# Patient Record
Sex: Male | Born: 1970 | Race: White | Hispanic: No | Smoking: Never smoker
Health system: Southern US, Community
[De-identification: ages and names within clinical notes are randomized; demographics above are authoritative.]

## PROBLEM LIST (undated history)

## (undated) DIAGNOSIS — I4891 Unspecified atrial fibrillation: Secondary | ICD-10-CM

## (undated) DIAGNOSIS — Q249 Congenital malformation of heart, unspecified: Secondary | ICD-10-CM

## (undated) DIAGNOSIS — I2510 Atherosclerotic heart disease of native coronary artery without angina pectoris: Secondary | ICD-10-CM

## (undated) DIAGNOSIS — R112 Nausea with vomiting, unspecified: Secondary | ICD-10-CM

## (undated) DIAGNOSIS — R0600 Dyspnea, unspecified: Secondary | ICD-10-CM

## (undated) DIAGNOSIS — G4733 Obstructive sleep apnea (adult) (pediatric): Secondary | ICD-10-CM

## (undated) DIAGNOSIS — N182 Chronic kidney disease, stage 2 (mild): Secondary | ICD-10-CM

## (undated) DIAGNOSIS — E7800 Pure hypercholesterolemia, unspecified: Secondary | ICD-10-CM

## (undated) DIAGNOSIS — R748 Abnormal levels of other serum enzymes: Secondary | ICD-10-CM

## (undated) DIAGNOSIS — D696 Thrombocytopenia, unspecified: Secondary | ICD-10-CM

## (undated) DIAGNOSIS — Z951 Presence of aortocoronary bypass graft: Secondary | ICD-10-CM

## (undated) DIAGNOSIS — Z9889 Other specified postprocedural states: Secondary | ICD-10-CM

## (undated) DIAGNOSIS — R001 Bradycardia, unspecified: Secondary | ICD-10-CM

## (undated) DIAGNOSIS — R319 Hematuria, unspecified: Secondary | ICD-10-CM

## (undated) DIAGNOSIS — K761 Chronic passive congestion of liver: Secondary | ICD-10-CM

## (undated) DIAGNOSIS — I4892 Unspecified atrial flutter: Secondary | ICD-10-CM

## (undated) DIAGNOSIS — I10 Essential (primary) hypertension: Secondary | ICD-10-CM

## (undated) DIAGNOSIS — I5042 Chronic combined systolic (congestive) and diastolic (congestive) heart failure: Secondary | ICD-10-CM

## (undated) HISTORY — DX: Chronic passive congestion of liver: K76.1

## (undated) HISTORY — DX: Unspecified atrial flutter: I48.92

## (undated) HISTORY — DX: Chronic combined systolic (congestive) and diastolic (congestive) heart failure: I50.42

## (undated) HISTORY — DX: Essential (primary) hypertension: I10

## (undated) HISTORY — DX: Chronic kidney disease, stage 2 (mild): N18.2

## (undated) HISTORY — DX: Congenital malformation of heart, unspecified: Q24.9

## (undated) HISTORY — PX: APPENDECTOMY: SHX54

## (undated) HISTORY — PX: GALLBLADDER SURGERY: SHX652

## (undated) HISTORY — PX: HERNIA REPAIR: SHX51

## (undated) HISTORY — DX: Pure hypercholesterolemia, unspecified: E78.00

---

## 2002-07-21 ENCOUNTER — Encounter: Admission: RE | Admit: 2002-07-21 | Discharge: 2002-07-21 | Payer: Self-pay

## 2002-08-16 ENCOUNTER — Encounter: Payer: Self-pay | Admitting: Urology

## 2002-08-16 ENCOUNTER — Encounter: Admission: RE | Admit: 2002-08-16 | Discharge: 2002-08-16 | Payer: Self-pay | Admitting: Urology

## 2010-09-30 ENCOUNTER — Inpatient Hospital Stay (HOSPITAL_COMMUNITY): Admission: EM | Admit: 2010-09-30 | Discharge: 2010-10-01 | Payer: Self-pay | Admitting: Emergency Medicine

## 2011-02-12 LAB — MRSA PCR SCREENING: MRSA by PCR: NEGATIVE

## 2011-04-23 ENCOUNTER — Other Ambulatory Visit (INDEPENDENT_AMBULATORY_CARE_PROVIDER_SITE_OTHER): Payer: Self-pay | Admitting: Surgery

## 2011-04-23 ENCOUNTER — Ambulatory Visit (HOSPITAL_COMMUNITY)
Admission: RE | Admit: 2011-04-23 | Discharge: 2011-04-23 | Disposition: A | Discharge status: Home / Self Care | Payer: 59 | Source: Ambulatory Visit | Attending: Surgery | Admitting: Surgery

## 2011-04-23 ENCOUNTER — Encounter (HOSPITAL_COMMUNITY): Payer: 59

## 2011-04-23 DIAGNOSIS — K432 Incisional hernia without obstruction or gangrene: Secondary | ICD-10-CM | POA: Insufficient documentation

## 2011-04-23 DIAGNOSIS — Z01812 Encounter for preprocedural laboratory examination: Secondary | ICD-10-CM | POA: Insufficient documentation

## 2011-04-23 DIAGNOSIS — I10 Essential (primary) hypertension: Secondary | ICD-10-CM | POA: Insufficient documentation

## 2011-04-23 DIAGNOSIS — Z01810 Encounter for preprocedural cardiovascular examination: Secondary | ICD-10-CM | POA: Insufficient documentation

## 2011-04-23 DIAGNOSIS — Z01818 Encounter for other preprocedural examination: Secondary | ICD-10-CM | POA: Insufficient documentation

## 2011-04-23 LAB — CBC
HCT: 46.5 % (ref 39.0–52.0)
Hemoglobin: 15.6 g/dL (ref 13.0–17.0)
MCH: 31.7 pg (ref 26.0–34.0)
MCHC: 33.5 g/dL (ref 30.0–36.0)
RBC: 4.92 MIL/uL (ref 4.22–5.81)

## 2011-04-23 LAB — DIFFERENTIAL
Basophils Relative: 1 % (ref 0–1)
Eosinophils Relative: 8 % — ABNORMAL HIGH (ref 0–5)
Monocytes Absolute: 1.1 10*3/uL — ABNORMAL HIGH (ref 0.1–1.0)
Neutro Abs: 4 10*3/uL (ref 1.7–7.7)
Neutrophils Relative %: 55 % (ref 43–77)

## 2011-04-23 LAB — COMPREHENSIVE METABOLIC PANEL
ALT: 78 U/L — ABNORMAL HIGH (ref 0–53)
AST: 24 U/L (ref 0–37)
CO2: 27 mEq/L (ref 19–32)
Chloride: 105 mEq/L (ref 96–112)
Creatinine, Ser: 1.07 mg/dL (ref 0.4–1.5)
GFR calc Af Amer: >60 mL/min (ref >60)
GFR calc non Af Amer: >60 mL/min (ref >60)
Glucose, Bld: 90 mg/dL (ref 70–99)
Sodium: 141 mEq/L (ref 135–145)
Total Bilirubin: 0.6 mg/dL (ref 0.3–1.2)

## 2011-04-23 LAB — SURGICAL PCR SCREEN: Staphylococcus aureus: POSITIVE — AB

## 2011-04-29 ENCOUNTER — Inpatient Hospital Stay (HOSPITAL_COMMUNITY): Admit: 2011-04-29 | Payer: Self-pay | Admitting: Surgery

## 2011-04-29 ENCOUNTER — Inpatient Hospital Stay (HOSPITAL_COMMUNITY): Admission: RE | Admit: 2011-04-29 | Payer: 59 | Source: Ambulatory Visit | Admitting: Surgery

## 2011-06-06 ENCOUNTER — Other Ambulatory Visit (HOSPITAL_COMMUNITY): Payer: 59

## 2011-06-10 ENCOUNTER — Ambulatory Visit (HOSPITAL_COMMUNITY)
Admission: RE | Admit: 2011-06-10 | Discharge: 2011-06-11 | DRG: 160 | Disposition: A | Discharge status: Home / Self Care | Payer: BC Managed Care – PPO | Source: Ambulatory Visit | Attending: Surgery | Admitting: Surgery

## 2011-06-10 DIAGNOSIS — G4730 Sleep apnea, unspecified: Secondary | ICD-10-CM | POA: Insufficient documentation

## 2011-06-10 DIAGNOSIS — I10 Essential (primary) hypertension: Secondary | ICD-10-CM | POA: Insufficient documentation

## 2011-06-10 DIAGNOSIS — E669 Obesity, unspecified: Secondary | ICD-10-CM | POA: Insufficient documentation

## 2011-06-10 DIAGNOSIS — K432 Incisional hernia without obstruction or gangrene: Secondary | ICD-10-CM | POA: Insufficient documentation

## 2011-06-10 LAB — BASIC METABOLIC PANEL
Calcium: 8.8 mg/dL (ref 8.4–10.5)
Creatinine, Ser: 1.24 mg/dL (ref 0.50–1.35)
GFR calc Af Amer: >60 mL/min (ref >60)
GFR calc non Af Amer: >60 mL/min (ref >60)
Sodium: 140 mEq/L (ref 135–145)

## 2011-06-10 LAB — SURGICAL PCR SCREEN: MRSA, PCR: NEGATIVE

## 2011-06-10 LAB — CBC
MCH: 32.8 pg (ref 26.0–34.0)
MCV: 95.7 fL (ref 78.0–100.0)
Platelets: 97 10*3/uL — ABNORMAL LOW (ref 150–400)
RDW: 13.3 % (ref 11.5–15.5)

## 2011-06-13 NOTE — Op Note (Signed)
NAMEJHON, JUDE NO.:  [id number]  MEDICAL RECORD NO.:  [id number]  LOCATION:  5149                         FACILITY:  [facility]  PHYSICIAN:  Marcello Moores A. Bayani Renteria, M.D.DATE OF BIRTH:  [birth year]  DATE OF PROCEDURE:  06/10/2011 DATE OF DISCHARGE:                              OPERATIVE REPORT   PREOPERATIVE DIAGNOSIS:  Recurrent incisional hernia.  POSTOPERATIVE DIAGNOSIS:  Recurrent incisional hernia.  PROCEDURE:  Laparoscopic repair of recurrent incisional hernia using a 20 x 30 cm piece of Parietex composite mesh.  SURGEON:  Marcello Moores A. Ronika Kelson, MD  ASSISTANT:  Odis Hollingshead, MD  ESTIMATED BLOOD LOSS:  50 mL.  SPECIMENS:  None.  INDICATIONS FOR PROCEDURE:  The patient is a 40 year old male who has recurrent periumbilical incisional hernia.  He was seen by Dr. Blinda Leatherwood in the office, but he wished to have it done repaired laparoscopically.  I met the patient in the office.  Reviewed Dr. Dickie La note.  Discussed the procedure with the patient in the office. I discussed the pros and cons of laparoscopic repairs and complications of laparoscopic repair compared to an open repair versus observational treatment.  He was having pain and discomfort and did not want to observe this.  Risk of surgery including but not exclusive of bleeding, infection, injury to the small bowel, colon, stomach, liver, pelvic structures of the bladder, kidney, ureters, spermatic cords, chronic pain from suture and mesh use and staple use, recurrence of hernia, small bowel obstruction requiring further surgery, and the need for revision of surgery which would include an open repair if his laparoscopic repair fails.  Other potential risks of cardiovascular risk, death, pulmonary complications, DVT, and a persistent seroma at the operative site.  After discussion of all the above, he wished to proceed.  DESCRIPTION OF PROCEDURE:  The patient was brought to the  operating room after we met in the holding area.  He was placed supine on the operating room table.  After induction of general anesthesia, Foley catheter was placed under sterile conditions.  The abdomen was prepped and draped in sterile fashion.  Time-out was done.  He received preoperative antibiotics in timing fashion.  A left upper quadrant 5-mm incision was made.  A 5-mm port Optiview port was used to gain access into the abdominal cavity under direct vision.  All layers of the abdominal wall visualized and entrance into the abdominal cavity was not difficult.  We then insufflated the abdominal cavity to 15 mmHg of CO2 and laparoscope was placed.  He had roughly a 15-cm defect in the periumbilical region. There were no significant intra-abdominal adhesions though.  He had a previous open cholecystectomy and there were adhesions in right upper quadrant minimal.  I then placed a left lower quadrant 5-mm port and a right upper quadrant 5-mm port.  After doing this, I mobilized the inferior rim of the hernia using a harmonic scalpel to take the preperitoneal fat pad down for about 5 cm so the mesh would lie flat below it.  Once we did this, I used a 20 x 30 cm composite Parietex mesh.  It was soaked.  Then, four stay sutures were placed in the  mesh and was oriented with a marking pen.  We then exchanged the right upper quadrant port for a 12-mm port.  I placed a mesh to that and later rolled out into the abdominal cavity without difficulty.  We then pulled all 4 stay sutures up initially.  These were tied down.  I then used a secure strap tacker and tacked the mesh circumferentially.  I then put 4 additional stay sutures in between the previous sutures under direct vision.  No #1 Novafil sutures were used for all the above.  I then put a second row of tacks circumferentially around the mesh using secure strap.  The barrier side was down toward the bowel.  There was no significant  bleeding.  I inspected the abdominal cavity and saw no evidence of injury to any of the intra-abdominal organs in all 4 quadrants.  At this point in time, I closed the 12-mm port site with a 0 Vicryl using a suture passer under direct vision.  Expro was injected under laparoscopic guidance along both sides of the rectus sheath for regional block using a total of 50 mL.  This was 20 mL diluted by 30 mL of saline.  We then evacuated the carbon dioxide.  We removed our ports without difficulty.  We closed the larger skin incisions with 4-0 Monocryl.  Dermabond was used to close the smaller skin incisions.  All final counts of sponge, needle, and instruments were found to be correct for this portion of the case.  Foley catheter was removed at the end of the case.  The patient did awaken from anesthesia coughing significantly.  I kept my hand over the wound as this occurred and felt no tearing or shearing of the mesh until Anesthesia could get the patient to stop coughing.  Binder was then applied.  He was then after extubation taken to recovery in satisfactory condition.  All final counts were found to be correct x2.     Casmira Cramer A. Jadore Veals, M.D.     TAC/MEDQ  D:  06/10/2011  T:  06/11/2011  Job:  [id number]  Electronically Signed by Erroll Luna M.D. on 06/13/2011 02:56:25 PM

## 2011-06-13 NOTE — Discharge Summary (Signed)
  Sean [name], OKIN NO.:  [id number]  MEDICAL RECORD NO.:  [id number]  LOCATION:  5149                         FACILITY:  [facility]  PHYSICIAN:  Marcello Moores A. Rhythm Wigfall, M.D.DATE OF BIRTH:  [birth year]  DATE OF ADMISSION:  06/10/2011 DATE OF DISCHARGE:  06/11/2011                              DISCHARGE SUMMARY   ADMITTING DIAGNOSIS:  Recurrent incisional hernia.  DISCHARGE DIAGNOSIS:  Recurrent incisional hernia.  PROCEDURE:  Laparoscopic repair of incisional hernia with mesh.  SURGEON:  Marcello Moores A. Graham Hyun, MD  BRIEF HISTORY:  The patient is a 40 year old male with recurrent periumbilical incisional hernia.  This was repaired once in the past, but is recurred.  He was admitting for laparoscopic repair.  Please see operative note for details.  HOSPITAL COURSE:  Unremarkable.  He is tolerating on postop day #1.  His signs were stable.  His wound are clean, dry, and intact.  He is wearing abdominal binder and had nonacute abdomen.  He was discharged home.  DISCHARGE INSTRUCTIONS:  He wears binder.  He may shower today.  Return to see me in 2-3 weeks.  He will refrain from heavy lifting until I see him back in the office.  He will refrain from driving until that is well.  He will resume regular diet.  He will go home on Percocet 1-2 tablets q.4 p.r.n. pain and resume all of his home medications as outlined in his medical reconciliation list contained in the chart and I have signed off from this.  CONDITION ON DISCHARGE:  Improved.     Laylia Mui A. Nandan Willems, M.D.     TAC/MEDQ  D:  06/11/2011  T:  06/11/2011  Job:  [id number]  Electronically Signed by Erroll Luna M.D. on 06/13/2011 02:56:30 PM

## 2011-07-10 ENCOUNTER — Ambulatory Visit (INDEPENDENT_AMBULATORY_CARE_PROVIDER_SITE_OTHER): Payer: BC Managed Care – PPO | Admitting: Surgery

## 2011-07-10 ENCOUNTER — Encounter (INDEPENDENT_AMBULATORY_CARE_PROVIDER_SITE_OTHER): Payer: Self-pay | Admitting: Surgery

## 2011-07-10 DIAGNOSIS — Z8719 Personal history of other diseases of the digestive system: Secondary | ICD-10-CM | POA: Insufficient documentation

## 2011-07-10 DIAGNOSIS — Z9889 Other specified postprocedural states: Secondary | ICD-10-CM

## 2011-07-10 DIAGNOSIS — K432 Incisional hernia without obstruction or gangrene: Secondary | ICD-10-CM

## 2011-07-10 HISTORY — DX: Personal history of other diseases of the digestive system: Z87.19

## 2011-07-10 HISTORY — DX: Incisional hernia without obstruction or gangrene: K43.2

## 2011-07-10 NOTE — Patient Instructions (Signed)
Follow up in 2 weeks.  Wear binder.

## 2011-07-10 NOTE — Progress Notes (Signed)
The patient returns today 3 weeks out from laparoscopic repair of recurrent incisional hernia. He is doing well.  Physical examination: Port sites are healing well. Small seroma noted around the umbilicus. No evidence of hernia recurrence.  Impression status post laparoscopic repair of recurrent incisional hernia doing well  Plan: He will follow up in 2 weeks. At that point in time if he is doing well he may be released back.

## 2011-07-25 ENCOUNTER — Ambulatory Visit (INDEPENDENT_AMBULATORY_CARE_PROVIDER_SITE_OTHER): Payer: BC Managed Care – PPO | Admitting: Surgery

## 2011-07-25 ENCOUNTER — Encounter (INDEPENDENT_AMBULATORY_CARE_PROVIDER_SITE_OTHER): Payer: Self-pay | Admitting: Surgery

## 2011-07-25 VITALS — Ht 71.0 in

## 2011-07-25 DIAGNOSIS — Z9889 Other specified postprocedural states: Secondary | ICD-10-CM

## 2011-07-25 NOTE — Patient Instructions (Signed)
Return to work September 4 to light duty.  Return to lifting 25 lbs or less September 18 and full duty oct 2.  Return to clinic 3 months.  Wear binder.

## 2011-07-25 NOTE — Progress Notes (Signed)
The patient returns today.  He is doing well.  The pain and swelling are improving.  They denies any redness or drainage from the incision. Small seroma around umbilicus noted. Impression: Status post laparoscopic repair of periumbilical hernia with mesh was small postoperative seroma  Plan: I've given instructions return to work over the next month. He will limitations until October. He will return to see me in 3 months. He is now 7 weeks out from surgery. She will continue to wear his binder.

## 2011-09-26 ENCOUNTER — Encounter (INDEPENDENT_AMBULATORY_CARE_PROVIDER_SITE_OTHER): Payer: Self-pay | Admitting: Surgery

## 2012-02-23 ENCOUNTER — Encounter (INDEPENDENT_AMBULATORY_CARE_PROVIDER_SITE_OTHER): Payer: BC Managed Care – PPO | Admitting: Surgery

## 2012-03-19 ENCOUNTER — Encounter (INDEPENDENT_AMBULATORY_CARE_PROVIDER_SITE_OTHER): Payer: Self-pay | Admitting: Surgery

## 2012-03-19 ENCOUNTER — Ambulatory Visit (INDEPENDENT_AMBULATORY_CARE_PROVIDER_SITE_OTHER): Payer: BC Managed Care – PPO | Admitting: Surgery

## 2012-03-19 DIAGNOSIS — IMO0002 Reserved for concepts with insufficient information to code with codable children: Secondary | ICD-10-CM

## 2012-03-19 NOTE — Progress Notes (Signed)
Subjective:     Patient ID: Sean [name], male   DOB: 1971/11/06, 41 y.o.   MRN: [id number]  HPIPatient returns today after undergoing a laparoscopic ventral hernia repair in July of 2012. He was seen in October last of 2012 and had a persistent seroma. He was supposed to followup in November but when away due to work issues. He was seen by his primary care doctor in a gastroenterologist and after a bulge was noted around his umbilicus. He underwent CT scanning which apparently showed a seroma or fatty tissue according to the patient. He denies any pain. He does get a little sore.   Review of Systems  Constitutional: Negative.   HENT: Negative.   Eyes: Negative.   Respiratory: Negative.   Cardiovascular: Negative.   Gastrointestinal: Negative.        Objective:   Physical Exam  Constitutional: He appears well-developed and well-nourished.  HENT:  Head: Normocephalic and atraumatic.  Neck: Normal range of motion. Neck supple.  Pulmonary/Chest: Effort normal and breath sounds normal.  Abdominal:         Assessment:     Post operative seroma less likely recurrent hernia    Plan:     Obtain CT record from [place].  If seroma will aspirate in office.

## 2012-03-19 NOTE — Patient Instructions (Signed)
Will obtain CT from [place].  Will need to return for aspiration of seroma when convenient.

## 2012-04-19 ENCOUNTER — Encounter (INDEPENDENT_AMBULATORY_CARE_PROVIDER_SITE_OTHER): Payer: Self-pay

## 2014-06-16 DIAGNOSIS — K3189 Other diseases of stomach and duodenum: Secondary | ICD-10-CM | POA: Insufficient documentation

## 2014-06-16 DIAGNOSIS — R1013 Epigastric pain: Secondary | ICD-10-CM

## 2015-12-23 ENCOUNTER — Emergency Department (HOSPITAL_COMMUNITY): Payer: BLUE CROSS/BLUE SHIELD

## 2015-12-23 ENCOUNTER — Encounter (HOSPITAL_COMMUNITY): Payer: Self-pay

## 2015-12-23 ENCOUNTER — Inpatient Hospital Stay (HOSPITAL_COMMUNITY)
Admission: EM | Admit: 2015-12-23 | Discharge: 2015-12-31 | DRG: 234 | Disposition: A | Discharge status: Home / Self Care | Payer: BLUE CROSS/BLUE SHIELD | Attending: Thoracic Surgery (Cardiothoracic Vascular Surgery) | Admitting: Thoracic Surgery (Cardiothoracic Vascular Surgery)

## 2015-12-23 DIAGNOSIS — R001 Bradycardia, unspecified: Secondary | ICD-10-CM | POA: Diagnosis present

## 2015-12-23 DIAGNOSIS — R079 Chest pain, unspecified: Secondary | ICD-10-CM | POA: Diagnosis present

## 2015-12-23 DIAGNOSIS — I471 Supraventricular tachycardia: Secondary | ICD-10-CM | POA: Diagnosis not present

## 2015-12-23 DIAGNOSIS — I10 Essential (primary) hypertension: Secondary | ICD-10-CM | POA: Diagnosis present

## 2015-12-23 DIAGNOSIS — I200 Unstable angina: Secondary | ICD-10-CM | POA: Diagnosis present

## 2015-12-23 DIAGNOSIS — I25110 Atherosclerotic heart disease of native coronary artery with unstable angina pectoris: Principal | ICD-10-CM | POA: Diagnosis present

## 2015-12-23 DIAGNOSIS — E785 Hyperlipidemia, unspecified: Secondary | ICD-10-CM | POA: Diagnosis present

## 2015-12-23 DIAGNOSIS — Z6841 Body Mass Index (BMI) 40.0 and over, adult: Secondary | ICD-10-CM

## 2015-12-23 DIAGNOSIS — N289 Disorder of kidney and ureter, unspecified: Secondary | ICD-10-CM

## 2015-12-23 DIAGNOSIS — Z8249 Family history of ischemic heart disease and other diseases of the circulatory system: Secondary | ICD-10-CM

## 2015-12-23 DIAGNOSIS — D62 Acute posthemorrhagic anemia: Secondary | ICD-10-CM | POA: Diagnosis not present

## 2015-12-23 DIAGNOSIS — E782 Mixed hyperlipidemia: Secondary | ICD-10-CM | POA: Diagnosis present

## 2015-12-23 DIAGNOSIS — Z951 Presence of aortocoronary bypass graft: Secondary | ICD-10-CM

## 2015-12-23 DIAGNOSIS — J9589 Other postprocedural complications and disorders of respiratory system, not elsewhere classified: Secondary | ICD-10-CM | POA: Diagnosis not present

## 2015-12-23 DIAGNOSIS — J9811 Atelectasis: Secondary | ICD-10-CM

## 2015-12-23 DIAGNOSIS — G4733 Obstructive sleep apnea (adult) (pediatric): Secondary | ICD-10-CM | POA: Diagnosis present

## 2015-12-23 DIAGNOSIS — R739 Hyperglycemia, unspecified: Secondary | ICD-10-CM | POA: Diagnosis present

## 2015-12-23 DIAGNOSIS — E7800 Pure hypercholesterolemia, unspecified: Secondary | ICD-10-CM | POA: Diagnosis present

## 2015-12-23 DIAGNOSIS — Q240 Dextrocardia: Secondary | ICD-10-CM

## 2015-12-23 DIAGNOSIS — D696 Thrombocytopenia, unspecified: Secondary | ICD-10-CM | POA: Diagnosis present

## 2015-12-23 DIAGNOSIS — I2510 Atherosclerotic heart disease of native coronary artery without angina pectoris: Secondary | ICD-10-CM | POA: Diagnosis present

## 2015-12-23 DIAGNOSIS — Z79899 Other long term (current) drug therapy: Secondary | ICD-10-CM

## 2015-12-23 DIAGNOSIS — D693 Immune thrombocytopenic purpura: Secondary | ICD-10-CM | POA: Diagnosis present

## 2015-12-23 HISTORY — DX: Thrombocytopenia, unspecified: D69.6

## 2015-12-23 HISTORY — DX: Morbid (severe) obesity due to excess calories: E66.01

## 2015-12-23 HISTORY — DX: Hematuria, unspecified: R31.9

## 2015-12-23 HISTORY — DX: Bradycardia, unspecified: R00.1

## 2015-12-23 HISTORY — DX: Abnormal levels of other serum enzymes: R74.8

## 2015-12-23 HISTORY — DX: Presence of aortocoronary bypass graft: Z95.1

## 2015-12-23 LAB — HEPATIC FUNCTION PANEL
ALBUMIN: 3 g/dL — AB (ref 3.5–5.0)
ALT: 94 U/L — ABNORMAL HIGH (ref 17–63)
AST: 55 U/L — ABNORMAL HIGH (ref 15–41)
Alkaline Phosphatase: 221 U/L — ABNORMAL HIGH (ref 38–126)
BILIRUBIN DIRECT: 0.2 mg/dL (ref 0.1–0.5)
BILIRUBIN TOTAL: 0.7 mg/dL (ref 0.3–1.2)
Indirect Bilirubin: 0.5 mg/dL (ref 0.3–0.9)
Total Protein: 6.1 g/dL — ABNORMAL LOW (ref 6.5–8.1)

## 2015-12-23 LAB — PROTIME-INR
INR: 1.01 (ref 0.00–1.49)
Prothrombin Time: 13.5 seconds (ref 11.6–15.2)

## 2015-12-23 LAB — CBC
HEMATOCRIT: 45.9 % (ref 39.0–52.0)
HEMOGLOBIN: 14.9 g/dL (ref 13.0–17.0)
MCH: 31.8 pg (ref 26.0–34.0)
MCHC: 32.5 g/dL (ref 30.0–36.0)
MCV: 97.9 fL (ref 78.0–100.0)
Platelets: 80 10*3/uL — ABNORMAL LOW (ref 150–400)
RBC: 4.69 MIL/uL (ref 4.22–5.81)
RDW: 13.9 % (ref 11.5–15.5)
WBC: 5.8 10*3/uL (ref 4.0–10.5)

## 2015-12-23 LAB — BASIC METABOLIC PANEL
Anion gap: 9 (ref 5–15)
BUN: 17 mg/dL (ref 6–20)
CHLORIDE: 106 mmol/L (ref 101–111)
CO2: 29 mmol/L (ref 22–32)
Calcium: 9.3 mg/dL (ref 8.9–10.3)
Creatinine, Ser: 1.47 mg/dL — ABNORMAL HIGH (ref 0.61–1.24)
GFR calc Af Amer: >60 mL/min (ref >60)
GFR calc non Af Amer: 57 mL/min — ABNORMAL LOW (ref >60)
Glucose, Bld: 109 mg/dL — ABNORMAL HIGH (ref 65–99)
POTASSIUM: 4.8 mmol/L (ref 3.5–5.1)
SODIUM: 144 mmol/L (ref 135–145)

## 2015-12-23 LAB — TROPONIN I: TROPONIN I: 0.04 ng/mL — AB (ref <0.031)

## 2015-12-23 LAB — TSH: TSH: 3.776 u[IU]/mL (ref 0.350–4.500)

## 2015-12-23 LAB — I-STAT TROPONIN, ED: Troponin i, poc: 0.01 ng/mL (ref 0.00–0.08)

## 2015-12-23 MED ORDER — ASPIRIN 300 MG RE SUPP: 300.0000 mg | RECTAL | Status: AC

## 2015-12-23 MED ORDER — ASPIRIN EC 81 MG PO TBEC
81.0000 mg | DELAYED_RELEASE_TABLET | Freq: Every day | ORAL | Status: DC
  Administered 2015-12-24 – 2015-12-25 (×2): 81 mg via ORAL
  Filled 2015-12-23 (×2): qty 1

## 2015-12-23 MED ORDER — NITROGLYCERIN 0.4 MG SL SUBL
0.4000 mg | SUBLINGUAL_TABLET | SUBLINGUAL | Status: DC | PRN
  Administered 2015-12-23 – 2015-12-25 (×3): 0.4 mg via SUBLINGUAL
  Filled 2015-12-23 (×3): qty 1

## 2015-12-23 MED ORDER — SODIUM CHLORIDE 0.9 % IV SOLN: 250.0000 mL | INTRAVENOUS | Status: DC | PRN

## 2015-12-23 MED ORDER — LORATADINE 10 MG PO TABS
10.0000 mg | ORAL_TABLET | Freq: Every day | ORAL | Status: DC
  Administered 2015-12-24 – 2015-12-25 (×2): 10 mg via ORAL
  Filled 2015-12-23 (×2): qty 1

## 2015-12-23 MED ORDER — PANTOPRAZOLE SODIUM 40 MG PO TBEC
40.0000 mg | DELAYED_RELEASE_TABLET | Freq: Every day | ORAL | Status: DC
  Administered 2015-12-23 – 2015-12-25 (×3): 40 mg via ORAL
  Filled 2015-12-23 (×3): qty 1

## 2015-12-23 MED ORDER — ASPIRIN 81 MG PO CHEW: 324.0000 mg | CHEWABLE_TABLET | ORAL | Status: AC

## 2015-12-23 MED ORDER — ACETAMINOPHEN 325 MG PO TABS
650.0000 mg | ORAL_TABLET | ORAL | Status: DC | PRN
  Administered 2015-12-23 – 2015-12-24 (×2): 650 mg via ORAL
  Filled 2015-12-23 (×2): qty 2

## 2015-12-23 MED ORDER — ONDANSETRON HCL 4 MG/2ML IJ SOLN: 4.0000 mg | Freq: Four times a day (QID) | INTRAMUSCULAR | Status: DC | PRN

## 2015-12-23 MED ORDER — SODIUM CHLORIDE 0.9 % IJ SOLN
3.0000 mL | Freq: Two times a day (BID) | INTRAMUSCULAR | Status: DC
  Administered 2015-12-23 – 2015-12-24 (×2): 3 mL via INTRAVENOUS

## 2015-12-23 MED ORDER — SODIUM CHLORIDE 0.9 % IJ SOLN: 3.0000 mL | INTRAMUSCULAR | Status: DC | PRN

## 2015-12-23 NOTE — ED Notes (Signed)
Attempted report x 2 

## 2015-12-23 NOTE — Progress Notes (Signed)
Patient called out for nurse reporting that he had a 8/10 pain in his jaw, neck, and head. SL nitro was given, vitals were at baseline, and EKG was performed. MD notified. Will continue to monitor.

## 2015-12-23 NOTE — ED Provider Notes (Signed)
CSN: [id number]     Arrival date & time 12/23/15  [id number] History   First MD Initiated Contact with Patient 12/23/15 1136     Chief Complaint  Patient presents with  . Chest Pain     (Consider location/radiation/quality/duration/timing/severity/associated sxs/prior Treatment) Patient is a 45 y.o. male presenting with chest pain. The history is provided by the patient.  Chest Pain Pain location:  Substernal area Pain quality: crushing and pressure   Pain radiates to:  Neck, L shoulder and R shoulder Pain radiates to the back: no   Pain severity:  Severe Onset quality:  Sudden Duration:  2 days Timing:  Intermittent Progression:  Resolved Chronicity:  New Relieved by:  Rest Worsened by:  Exertion Ineffective treatments:  None tried Associated symptoms: no abdominal pain, no fever, no headache, no palpitations, no shortness of breath and not vomiting   Risk factors: hypertension, male sex and obesity   Risk factors: no diabetes mellitus, no high cholesterol and no smoking     45 yo M  With a chief complaint chest pain. This feels like a pressure and radiates to his jaw as well as both shoulders. This occurs on exertion. Usually gets better with rest. Shortness of breath with it as well. Patient had an episode this morning that was so severe that he vomited a couple times. Felt mildly better after that. Has a family history of a father had an MI in his early 40s. Patient has hypertension is morbidly obese.  Patient discussed the symptoms with his father who said they were the exact same symptoms when he was diagnosed with a acute MI and had  2 stents placed.  Past Medical History  Diagnosis Date  . Hypercholesteremia     a. Prev taken off statin due to abnormal liver function.  . Hypertension   . Morbid obesity ([place])   . Abnormal liver enzymes     a. Sees a doctor in [place].  . Thrombocytopenia ([place])   . Hematuria     a. Chronic hx of this, no prior etiology determined through  workup per patient.  . Sinus bradycardia    Past Surgical History  Procedure Laterality Date  . Gallbladder surgery    . Appendectomy    . Hernia repair     Family History  Problem Relation Age of Onset  . Heart attack    . Hyperlipidemia    . CAD Father     2 stents ~ 51   Social History  Substance Use Topics  . Smoking status: Never Smoker   . Smokeless tobacco: None  . Alcohol Use: 0.0 oz/week    0 Standard drinks or equivalent per week     Comment: Once a month    Review of Systems  Constitutional: Negative for fever and chills.  HENT: Negative for congestion and facial swelling.   Eyes: Negative for discharge and visual disturbance.  Respiratory: Negative for shortness of breath.   Cardiovascular: Positive for chest pain. Negative for palpitations.  Gastrointestinal: Negative for vomiting, abdominal pain and diarrhea.  Musculoskeletal: Negative for myalgias and arthralgias.  Skin: Negative for color change and rash.  Neurological: Negative for tremors, syncope and headaches.  Psychiatric/Behavioral: Negative for confusion and dysphoric mood.      Allergies  Review of patient's allergies indicates no known allergies.  Home Medications   Prior to Admission medications   Medication Sig Start Date End Date Taking? Authorizing Provider  atenolol (TENORMIN) 25 MG tablet Take 25 mg  by mouth daily.   Yes Historical Provider, MD  cetirizine (ZYRTEC) 10 MG tablet Take 10 mg by mouth daily.   Yes Historical Provider, MD  furosemide (LASIX) 40 MG tablet Take 40 mg by mouth daily.   Yes Historical Provider, MD  losartan (COZAAR) 100 MG tablet Take 100 mg by mouth daily.   Yes Historical Provider, MD  omeprazole (PRILOSEC) 20 MG capsule Take 20 mg by mouth daily.   Yes Historical Provider, MD   BP 97/61 mmHg  Pulse 45  Temp(Src) 98.6 F (37 C) (Oral)  Resp 20  Ht 5\' 11"  (1.803 m)  Wt 340 lb 3 oz (154.308 kg)  BMI 47.47 kg/m2  SpO2 99% Physical Exam   Constitutional: He is oriented to person, place, and time. He appears well-developed and well-nourished.  obese  HENT:  Head: Normocephalic and atraumatic.  Eyes: EOM are normal. Pupils are equal, round, and reactive to light.  Neck: Normal range of motion. Neck supple. No JVD present.  Cardiovascular: Normal rate and regular rhythm.  Exam reveals no gallop and no friction rub.   No murmur heard. Pulmonary/Chest: No respiratory distress. He has no wheezes.  Abdominal: He exhibits no distension. There is no tenderness. There is no rebound and no guarding.  Musculoskeletal: Normal range of motion.  Neurological: He is alert and oriented to person, place, and time.  Skin: No rash noted. No pallor.  Psychiatric: He has a normal mood and affect. His behavior is normal.  Nursing note and vitals reviewed.   ED Course  Procedures (including critical care time) Labs Review Labs Reviewed  BASIC METABOLIC PANEL - Abnormal; Notable for the following:    Glucose, Bld 109 (*)    Creatinine, Ser 1.47 (*)    GFR calc non Af Amer 57 (*)    All other components within normal limits  CBC - Abnormal; Notable for the following:    Platelets 80 (*)    All other components within normal limits  I-STAT TROPOININ, ED    Imaging Review Dg Chest 2 View  12/23/2015  CLINICAL DATA:  45 year old male with 2-3 days of chest pain radiating to both shoulders. Shortness of breath and jaw pain. Nausea and vomiting today. Initial encounter. EXAM: CHEST  2 VIEW COMPARISON:  04/23/2011 FINDINGS: Chronic moderate elevation of the left hemidiaphragm not significantly changed. Associated chronic left lung base atelectasis. Right lung volume remains normal. Cardiomegaly appears mildly increased since 2012. Other mediastinal contours are within normal limits. Visualized tracheal air column is within normal limits. No pneumothorax, pulmonary edema or pleural effusion. No other confluent pulmonary opacity. No acute osseous  abnormality identified. IMPRESSION: 1. 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 Chronic left lung base atelectasis. 2. Cardiomegaly appears mildly increased since 2012. 3.  No other acute findings identified. Electronically Signed   By: Genevie Ann M.D.   On: 12/23/2015 11:18   I have personally reviewed and evaluated these images and lab results as part of my medical decision-making.   EKG Interpretation   Date/Time:  Sunday December 23 2015 10:01:47 EST Ventricular Rate:  49 PR Interval:  156 QRS Duration: 96 QT Interval:  432 QTC Calculation: 390 R Axis:   57 Text Interpretation:  Sinus bradycardia Otherwise normal ECG No  significant change since last tracing Confirmed by Nyeisha Goodall MD, Quillian Quince  [id number]) on 12/23/2015 12:32:40 PM      MDM   Final diagnoses:  Chest pain with high risk for cardiac etiology    45 yo  M  With a chief complaint chest pain. Patient's history is extremely concerning for coronary artery disease. Initial troponin is negative EKG is unremarkable. This case was discussed with cardiology and he will be admitted.  The patients results and plan were reviewed and discussed.   Any x-rays performed were independently reviewed by myself.   Differential diagnosis were considered with the presenting HPI.  Medications - No data to display  Filed Vitals:   12/23/15 1315 12/23/15 1330 12/23/15 1345 12/23/15 1400  BP: 100/52 97/53 104/58 97/61  Pulse: 45 57 44 45  Temp:      TempSrc:      Resp: 18 15 22 20   Height:      Weight:      SpO2: 99% 100% 96% 99%    Final diagnoses:  Chest pain with high risk for cardiac etiology    Admission/ observation were discussed with the admitting physician, patient and/or family and they are comfortable with the plan.      Deno Etienne, DO 12/23/15 1510

## 2015-12-23 NOTE — H&P (Signed)
Cardiology Consultation Note    Patient ID: Sean [name] MRN: [id number], DOB: Feb 21, 1971 Date of Encounter: 12/23/2015, 2:18 PM Primary Physician: PROVIDER NOT [facility] Primary Cardiologist: New  Chief Complaint: chest pain Reason for Admission: chest pain Requesting MD: Dr. 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 Per his report he is followed by a doctor in [place] who has been investigating his abnormal liver function. This was initially felt due to statin which was discontinued, but has since persisted. He reports benign workup thus far other than chronically low platelet count.  For the past 3 days he has felt two different kinds of pain - one is a chest pressure/heaviness that comes and goes, lasting about 10 minutes at at time, that seems to occur primarily when he gets around and walks or does any kind of activity. He also notices a more constant tension-type discomfort in the back of his neck and head. He has had some associated SOB. This morning he had an episode of nausea/vomiting due to the pain, as well as pain radiating to his jaw and arms. He reports chronic unchanged swelling in LEE. No LE erythema or pain, weight changes, BRBPR, melena. He does not exercise. Labs are notable for Cr 1.47 (prev 1.24 in 2012), plt 80 (prev 97 in 2012), and neg troponin x 1. 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 Telemetry reveals sinus bradycarda with HR in the 40s (lowest HR 39, nonsustained). He denies any syncope. He currently denies any pain. He is a nonsmoker. Family history + for father who had 2 stents at age 17. BP in the upper [id number] systolic in the ER- he takes his BP meds QHS. He is not tachycardic, tachypneic, hypoxic  or presently SOB.   Past Medical History  Diagnosis Date  . Hypercholesteremia     a. Prev taken off statin due to abnormal liver function.  . Hypertension   . Morbid obesity ([place])   . Abnormal liver enzymes     a. Sees a doctor in [place].  . Thrombocytopenia ([place])   . Hematuria     a. Chronic hx of this, no prior etiology determined through workup per patient.  . Sinus bradycardia      Surgical History:  Past Surgical History  Procedure Laterality Date  . Gallbladder surgery    . Appendectomy    . Hernia repair       Home Meds: Prior to Admission medications   Medication Sig Start Date End Date Taking? Authorizing Provider  atenolol (TENORMIN) 25 MG tablet Take 25 mg by mouth daily.   Yes Historical Provider, MD  cetirizine (ZYRTEC) 10 MG tablet Take 10 mg by mouth daily.   Yes Historical Provider, MD  furosemide (LASIX) 40 MG tablet Take 40 mg by mouth daily.   Yes Historical Provider, MD  losartan (COZAAR) 100 MG tablet Take 100 mg by mouth daily.   Yes Historical Provider, MD  omeprazole (PRILOSEC) 20 MG capsule Take 20 mg by mouth daily.   Yes Historical Provider, MD    Allergies: No Known Allergies  Social History   Social History  . Marital Status: Legally Separated    Spouse Name: N/A  . Number of Children: N/A  . Years of  Education: N/A   Occupational History  . Truck Geophysicist/field seismologist    Social History Main Topics  . Smoking status: Never Smoker   . Smokeless tobacco: Not on file  . Alcohol Use: 0.0 oz/week    0 Standard drinks or equivalent per week     Comment: Once a month  . Drug Use: No  . Sexual Activity: Not on file   Other Topics Concern  . Not on file   Social History Narrative     Family History  Problem Relation Age of Onset  . Heart attack    . Hyperlipidemia    . CAD Father     2 stents ~ 54    Review of Systems:No fever or chills. All other systems reviewed and are otherwise negative except as noted above.  Labs:   Lab Results    Component Value Date   WBC 5.8 12/23/2015   HGB 14.9 12/23/2015   HCT 45.9 12/23/2015   MCV 97.9 12/23/2015   PLT 80* 12/23/2015    Recent Labs Lab 12/23/15 1023  NA 144  K 4.8  CL 106  CO2 29  BUN 17  CREATININE 1.47*  CALCIUM 9.3  GLUCOSE 109*    Radiology/Studies:  Dg Chest 2 View  12/23/2015  CLINICAL DATA:  45 year old male with 2-3 days of chest pain radiating to both shoulders. Shortness of breath and jaw pain. Nausea and vomiting today. Initial encounter. EXAM: CHEST  2 VIEW COMPARISON:  04/23/2011 FINDINGS: Chronic moderate elevation of the left hemidiaphragm not significantly changed. Associated chronic left lung base atelectasis. Right lung volume remains normal. Cardiomegaly appears mildly increased since 2012. Other mediastinal contours are within normal limits. Visualized tracheal air column is within normal limits. No pneumothorax, pulmonary edema or pleural effusion. No other confluent pulmonary opacity. No acute osseous abnormality identified. IMPRESSION: 1. 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 Chronic left lung base atelectasis. 2. Cardiomegaly appears mildly increased since 2012. 3.  No other acute findings identified. Electronically Signed   By: Genevie Ann M.D.   On: 12/23/2015 11:18   Wt Readings from Last 3 Encounters:  12/23/15 340 lb 3 oz (154.308 kg)   EKG: sinus bradycardia 49bpm, no acute ST- T changes  Physical Exam: Blood pressure 97/61, pulse 45, temperature 98.6 F (37 C), temperature source Oral, resp. rate 20, height 5\' 11"  (1.803 m), weight 340 lb 3 oz (154.308 kg), SpO2 99 %. Body mass index is 47.47 kg/(m^2). General: Well developed obese WM in no acute distress. Head: Normocephalic, atraumatic, sclera non-icteric, no xanthomas, nares are without discharge.  Neck: Negative for carotid bruits. JVD does not appear elevated but is difficult to assess given habitus. Lungs: Clear bilaterally to auscultation without wheezes, rales, or rhonchi.  Breathing is unlabored. Heart: RRR, mildly bradycardic with S1 S2. No murmurs, rubs, or gallops appreciated. Abdomen: Soft, non-tender, non-distended with normoactive bowel sounds. No hepatomegaly. No rebound/guarding. No obvious abdominal masses. Msk:  Strength and tone appear normal for age. Extremities: No clubbing or cyanosis. Chronic trace BLE edema superimposed on large baseline leg habitus.  Distal pedal pulses are 2+ and equal bilaterally. Neuro: Alert and oriented X 3. No focal deficit. No facial asymmetry. Moves all extremities spontaneously. Psych:  Responds to questions appropriately with a normal affect.    Assessment and Plan   1. Chest pain - Chest pain with some features concerning for angina. Less ideal candidate to jump straight to cath given thrombocytopenia and renal insufficiency of unclear chronicity. Cardiac risk  factors include HTN, HLD, morbid obesity and family history of CAD. Will admit and cycle troponins. Will review plan for workup with MD. If cardiac workup unrevealing can consider evaluation for PE since he is a truck driver, although he is not exhibiting clinical signs of such including tachycardia, tachypnea or hypoxia. Start ASA and follow platelets. Hold off on heparin unless troponins turn positive.  2. HTN, controlled albeit softer in ER - Hold Lasix and losartan until we can see trajectory of his creatinine.   3. Sinus bradycardia - check TSH. Appears chronic based on EKG showing HR of 46 in 2012. May need assessment for chronotropic competence as this could be playing a role in his symptoms - for now, will hold atenolol and follow HR.  4. Hyperlipidemia, untreated due to prior h/o abnormal liver function tests - add baseline LFTs. Check lipids in AM.   5. Renal insufficiency of uncertain chronicity - see above.  6. Chronic thrombocytopenia - follow. Hgb is normal.  7. Hyperglycemia - check A1C.  Signed, Charlie Pitter PA-C 12/23/2015, 2:18 PM Pager:  [phone]  I have seen and examined this patient with Melina Copa.  Agree with above, note added to reflect my findings.  On exam, regular rhythm, no murmurs, lungs clear.  Had chest pain today with walking associated with pain in jaw and arms as well as diaphoresis.  Improved with rest.  Possibly due to unstable angina.  Plan for rule out overnight with possible cath vs. Stress testing in the morning. Got aspirin, will start heparin if troponin turns positive.  Will M. Camnitz MD 12/23/2015 3:27 PM

## 2015-12-23 NOTE — ED Notes (Addendum)
Pt. Reports chest pain began 2 days ago.  Describes as a sharp intermittent pain that will radiate into both arm and bilateral jaws.  Pt. Become sob with the pain and activity increases the pain.  Rest decreased the pain.  Pt. Did have nausea and vomited  X1 before coming to [place].   Pt. Denies any cold symptoms, ECG completed in Triage

## 2015-12-23 NOTE — ED Notes (Signed)
Attempted report 

## 2015-12-24 ENCOUNTER — Ambulatory Visit (HOSPITAL_BASED_OUTPATIENT_CLINIC_OR_DEPARTMENT_OTHER): Payer: BLUE CROSS/BLUE SHIELD

## 2015-12-24 ENCOUNTER — Encounter (HOSPITAL_COMMUNITY)
Admission: EM | Disposition: A | Discharge status: Home / Self Care | Payer: Self-pay | Source: Home / Self Care | Attending: Thoracic Surgery (Cardiothoracic Vascular Surgery)

## 2015-12-24 ENCOUNTER — Other Ambulatory Visit: Payer: Self-pay | Admitting: *Deleted

## 2015-12-24 DIAGNOSIS — I209 Angina pectoris, unspecified: Secondary | ICD-10-CM | POA: Diagnosis not present

## 2015-12-24 DIAGNOSIS — Q240 Dextrocardia: Secondary | ICD-10-CM | POA: Diagnosis not present

## 2015-12-24 DIAGNOSIS — I10 Essential (primary) hypertension: Secondary | ICD-10-CM

## 2015-12-24 DIAGNOSIS — E785 Hyperlipidemia, unspecified: Secondary | ICD-10-CM | POA: Diagnosis not present

## 2015-12-24 DIAGNOSIS — G4733 Obstructive sleep apnea (adult) (pediatric): Secondary | ICD-10-CM

## 2015-12-24 DIAGNOSIS — I25110 Atherosclerotic heart disease of native coronary artery with unstable angina pectoris: Principal | ICD-10-CM

## 2015-12-24 DIAGNOSIS — R079 Chest pain, unspecified: Secondary | ICD-10-CM | POA: Diagnosis not present

## 2015-12-24 DIAGNOSIS — I2510 Atherosclerotic heart disease of native coronary artery without angina pectoris: Secondary | ICD-10-CM | POA: Diagnosis not present

## 2015-12-24 DIAGNOSIS — N289 Disorder of kidney and ureter, unspecified: Secondary | ICD-10-CM | POA: Diagnosis not present

## 2015-12-24 DIAGNOSIS — I200 Unstable angina: Secondary | ICD-10-CM | POA: Diagnosis present

## 2015-12-24 HISTORY — PX: CARDIAC CATHETERIZATION: SHX172

## 2015-12-24 LAB — COMPREHENSIVE METABOLIC PANEL
ALT: 80 U/L — AB (ref 17–63)
ANION GAP: 6 (ref 5–15)
AST: 46 U/L — ABNORMAL HIGH (ref 15–41)
Albumin: 3 g/dL — ABNORMAL LOW (ref 3.5–5.0)
Alkaline Phosphatase: 204 U/L — ABNORMAL HIGH (ref 38–126)
BILIRUBIN TOTAL: 0.6 mg/dL (ref 0.3–1.2)
BUN: 17 mg/dL (ref 6–20)
CO2: 27 mmol/L (ref 22–32)
Calcium: 8.9 mg/dL (ref 8.9–10.3)
Chloride: 109 mmol/L (ref 101–111)
Creatinine, Ser: 1.3 mg/dL — ABNORMAL HIGH (ref 0.61–1.24)
GFR calc Af Amer: >60 mL/min (ref >60)
GFR calc non Af Amer: >60 mL/min (ref >60)
GLUCOSE: 122 mg/dL — AB (ref 65–99)
POTASSIUM: 4.5 mmol/L (ref 3.5–5.1)
Sodium: 142 mmol/L (ref 135–145)
TOTAL PROTEIN: 6.1 g/dL — AB (ref 6.5–8.1)

## 2015-12-24 LAB — CBC
HEMATOCRIT: 44.6 % (ref 39.0–52.0)
HEMOGLOBIN: 14.4 g/dL (ref 13.0–17.0)
MCH: 31.4 pg (ref 26.0–34.0)
MCHC: 32.3 g/dL (ref 30.0–36.0)
MCV: 97.4 fL (ref 78.0–100.0)
Platelets: 75 10*3/uL — ABNORMAL LOW (ref 150–400)
RBC: 4.58 MIL/uL (ref 4.22–5.81)
RDW: 13.7 % (ref 11.5–15.5)
WBC: 6.6 10*3/uL (ref 4.0–10.5)

## 2015-12-24 LAB — GLUCOSE, CAPILLARY: GLUCOSE-CAPILLARY: 89 mg/dL (ref 65–99)

## 2015-12-24 LAB — LIPID PANEL
CHOLESTEROL: 183 mg/dL (ref 0–200)
HDL: 32 mg/dL — ABNORMAL LOW (ref >40)
LDL Cholesterol: 121 mg/dL — ABNORMAL HIGH (ref 0–99)
Total CHOL/HDL Ratio: 5.7 RATIO
Triglycerides: 149 mg/dL (ref <150)
VLDL: 30 mg/dL (ref 0–40)

## 2015-12-24 LAB — SURGICAL PCR SCREEN
MRSA, PCR: NEGATIVE
STAPHYLOCOCCUS AUREUS: NEGATIVE

## 2015-12-24 LAB — HEMOGLOBIN A1C
Hgb A1c MFr Bld: 5.6 % (ref 4.8–5.6)
MEAN PLASMA GLUCOSE: 114 mg/dL

## 2015-12-24 LAB — TROPONIN I: TROPONIN I: 0.03 ng/mL (ref <0.031)

## 2015-12-24 SURGERY — RIGHT/LEFT HEART CATH AND CORONARY ANGIOGRAPHY

## 2015-12-24 MED ORDER — ASPIRIN 81 MG PO CHEW: 81.0000 mg | CHEWABLE_TABLET | ORAL | Status: AC

## 2015-12-24 MED ORDER — SODIUM CHLORIDE 0.9 % IV SOLN: 250.0000 mL | INTRAVENOUS | Status: DC | PRN

## 2015-12-24 MED ORDER — SODIUM CHLORIDE 0.9 % IJ SOLN: 3.0000 mL | INTRAMUSCULAR | Status: DC | PRN

## 2015-12-24 MED ORDER — NITROGLYCERIN 1 MG/10 ML FOR IR/CATH LAB
INTRA_ARTERIAL | Status: AC
  Filled 2015-12-24: qty 10

## 2015-12-24 MED ORDER — MIDAZOLAM HCL 2 MG/2ML IJ SOLN
INTRAMUSCULAR | Status: AC
  Filled 2015-12-24: qty 2

## 2015-12-24 MED ORDER — SODIUM CHLORIDE 0.9 % IJ SOLN
3.0000 mL | Freq: Two times a day (BID) | INTRAMUSCULAR | Status: DC
  Administered 2015-12-24 – 2015-12-25 (×2): 3 mL via INTRAVENOUS

## 2015-12-24 MED ORDER — IOHEXOL 350 MG/ML SOLN
INTRAVENOUS | Status: DC | PRN
  Administered 2015-12-24: 80 mL via INTRACARDIAC

## 2015-12-24 MED ORDER — SODIUM CHLORIDE 0.9 % IV SOLN
INTRAVENOUS | Status: DC
  Administered 2015-12-24: 09:00:00 via INTRAVENOUS

## 2015-12-24 MED ORDER — ONDANSETRON HCL 4 MG/2ML IJ SOLN
4.0000 mg | Freq: Four times a day (QID) | INTRAMUSCULAR | Status: DC | PRN
  Administered 2015-12-25: 4 mg via INTRAVENOUS
  Filled 2015-12-24: qty 2

## 2015-12-24 MED ORDER — SODIUM CHLORIDE 0.9 % IV SOLN
INTRAVENOUS | Status: AC
  Administered 2015-12-24 (×2): via INTRAVENOUS

## 2015-12-24 MED ORDER — VERAPAMIL HCL 2.5 MG/ML IV SOLN
INTRAVENOUS | Status: AC
  Filled 2015-12-24: qty 2

## 2015-12-24 MED ORDER — HEPARIN SODIUM (PORCINE) 1000 UNIT/ML IJ SOLN
INTRAMUSCULAR | Status: DC | PRN
  Administered 2015-12-24: 2500 [IU] via INTRAVENOUS

## 2015-12-24 MED ORDER — LIDOCAINE HCL (PF) 1 % IJ SOLN
INTRAMUSCULAR | Status: DC | PRN
  Administered 2015-12-24: 16:00:00

## 2015-12-24 MED ORDER — SODIUM CHLORIDE 0.9 % IJ SOLN: 3.0000 mL | Freq: Two times a day (BID) | INTRAMUSCULAR | Status: DC

## 2015-12-24 MED ORDER — ROSUVASTATIN CALCIUM 40 MG PO TABS
40.0000 mg | ORAL_TABLET | Freq: Every day | ORAL | Status: DC
  Administered 2015-12-24 – 2015-12-30 (×6): 40 mg via ORAL
  Filled 2015-12-24 (×3): qty 1
  Filled 2015-12-24 (×3): qty 2

## 2015-12-24 MED ORDER — FENTANYL CITRATE (PF) 100 MCG/2ML IJ SOLN
INTRAMUSCULAR | Status: AC
  Filled 2015-12-24: qty 2

## 2015-12-24 MED ORDER — PERFLUTREN LIPID MICROSPHERE
INTRAVENOUS | Status: AC
  Administered 2015-12-24: 4 mL via INTRAVENOUS
  Filled 2015-12-24: qty 10

## 2015-12-24 MED ORDER — SODIUM CHLORIDE 0.9 % IV SOLN: INTRAVENOUS | Status: DC

## 2015-12-24 MED ORDER — FENTANYL CITRATE (PF) 100 MCG/2ML IJ SOLN
INTRAMUSCULAR | Status: DC | PRN
  Administered 2015-12-24: 50 ug via INTRAVENOUS
  Administered 2015-12-24 (×2): 25 ug via INTRAVENOUS
  Administered 2015-12-24: 50 ug via INTRAVENOUS

## 2015-12-24 MED ORDER — ACETAMINOPHEN 325 MG PO TABS
650.0000 mg | ORAL_TABLET | ORAL | Status: DC | PRN
  Administered 2015-12-24 – 2015-12-25 (×3): 650 mg via ORAL
  Filled 2015-12-24 (×3): qty 2

## 2015-12-24 MED ORDER — HEPARIN (PORCINE) IN NACL 100-0.45 UNIT/ML-% IJ SOLN
1500.0000 [IU]/h | INTRAMUSCULAR | Status: DC
  Administered 2015-12-25: 1300 [IU]/h via INTRAVENOUS
  Administered 2015-12-25: 1500 [IU]/h via INTRAVENOUS
  Filled 2015-12-24 (×2): qty 250

## 2015-12-24 MED ORDER — HEPARIN (PORCINE) IN NACL 2-0.9 UNIT/ML-% IJ SOLN
INTRAMUSCULAR | Status: AC
  Filled 2015-12-24: qty 1000

## 2015-12-24 MED ORDER — MIDAZOLAM HCL 2 MG/2ML IJ SOLN
INTRAMUSCULAR | Status: DC | PRN
  Administered 2015-12-24 (×2): 2 mg via INTRAVENOUS

## 2015-12-24 MED ORDER — VERAPAMIL HCL 2.5 MG/ML IV SOLN
INTRAVENOUS | Status: DC | PRN
  Administered 2015-12-24: 15:00:00 via INTRA_ARTERIAL

## 2015-12-24 MED ORDER — PERFLUTREN LIPID MICROSPHERE
1.0000 mL | INTRAVENOUS | Status: AC | PRN
  Administered 2015-12-24: 4 mL via INTRAVENOUS
  Filled 2015-12-24: qty 10

## 2015-12-24 MED ORDER — HEPARIN SODIUM (PORCINE) 1000 UNIT/ML IJ SOLN
INTRAMUSCULAR | Status: AC
  Filled 2015-12-24: qty 1

## 2015-12-24 MED ORDER — LIDOCAINE HCL (PF) 1 % IJ SOLN
INTRAMUSCULAR | Status: AC
  Filled 2015-12-24: qty 30

## 2015-12-24 SURGICAL SUPPLY — 18 items
CATH INFINITI 5 FR JL3.5 (CATHETERS) ×3 IMPLANT
CATH INFINITI 5FR ANG PIGTAIL (CATHETERS) ×3 IMPLANT
CATH INFINITI 5FR JL4 (CATHETERS) ×2 IMPLANT
CATH INFINITI 5FR JL5 (CATHETERS) ×3 IMPLANT
CATH INFINITI 5FR MULTPACK ANG (CATHETERS) ×2 IMPLANT
CATH INFINITI JR4 5F (CATHETERS) ×3 IMPLANT
CATH SWAN GANZ 7F STRAIGHT (CATHETERS) ×6 IMPLANT
DEVICE RAD COMP TR BAND LRG (VASCULAR PRODUCTS) ×3 IMPLANT
GLIDESHEATH SLEND SS 6F .021 (SHEATH) ×5 IMPLANT
KIT HEART LEFT (KITS) ×3 IMPLANT
PACK CARDIAC CATHETERIZATION (CUSTOM PROCEDURE TRAY) ×3 IMPLANT
SHEATH PINNACLE 5F 10CM (SHEATH) ×2 IMPLANT
SHEATH PINNACLE 7F 10CM (SHEATH) ×6 IMPLANT
SYR MEDRAD MARK V 150ML (SYRINGE) ×3 IMPLANT
TRANSDUCER W/STOPCOCK (MISCELLANEOUS) ×3 IMPLANT
TUBING CIL FLEX 10 FLL-RA (TUBING) ×3 IMPLANT
WIRE HI TORQ VERSACORE-J 145CM (WIRE) ×2 IMPLANT
WIRE SAFE-T 1.5MM-J .035X260CM (WIRE) ×3 IMPLANT

## 2015-12-24 NOTE — Progress Notes (Signed)
ANTICOAGULATION CONSULT NOTE - Initial Consult  Pharmacy Consult for heparin Indication: chest pain/ACS  No Known Allergies  Patient Measurements: Height: 5\' 11"  (180.3 cm) Weight: (!) 336 lb 6.8 oz (152.6 kg) IBW/kg (Calculated) : 75.3 Heparin Dosing Weight: 111kg  Vital Signs: Temp: 97.7 F (36.5 C) (01/23 1633) Temp Source: Oral (01/23 1633) BP: 170/88 mmHg (01/23 1618) Pulse Rate: 74 (01/23 1618)  Labs:  Recent Labs  12/23/15 1023 12/23/15 1926 12/24/15 0044 12/24/15 0622  HGB 14.9  --   --  14.4  HCT 45.9  --   --  44.6  PLT 80*  --   --  75*  LABPROT  --  13.5  --   --   INR  --  1.01  --   --   CREATININE 1.47*  --   --  1.30*  TROPONINI  --  0.04* 0.03 <0.03    Estimated Creatinine Clearance: 110.1 mL/min (by C-G formula based on Cr of 1.3).   Medical History: Past Medical History  Diagnosis Date  . Hypercholesteremia     a. Prev taken off statin due to abnormal liver function.  . Hypertension   . Morbid obesity ([place])   . Abnormal liver enzymes     a. Sees a doctor in [place].  . Thrombocytopenia ([place])   . Hematuria     a. Chronic hx of this, no prior etiology determined through workup per patient.  . Sinus bradycardia     Medications:  Infusions:  . sodium chloride Stopped (12/24/15 1634)  . sodium chloride 100 mL/hr at 12/24/15 1634  . [START ON 12/25/2015] heparin      Assessment: 38 yom presented with chest pain now s/p cardiac cath. Heparin to start 8 hour post-sheath removal. Obtaining TCTS consult. Baseline H/H is WNL but platelets are low. Pt is known to have chronic thrombocytopenia.   Goal of Therapy:  Heparin level 0.3-0.7 units/ml Monitor platelets by anticoagulation protocol: Yes   Plan:  - Start heparin gtt at 1300 units/hr at midnight - Check an 8 hour heparin level - Daily heparin level and CBC  Bobette Leyh, Rande Lawman 12/24/2015,4:40 PM

## 2015-12-24 NOTE — H&P (View-Only) (Signed)
Subjective: He had CP twice overnight with radiation to his neck and jaw.  Each time it is worse or initiated with exertion(he was in the shower first time) and eases with rest.   NTG SL was given once and helped ease the pain which was easing off already    Objective: Vital signs in last 24 hours: Temp:  [97.8 F (36.6 C)-98.6 F (37 C)] 97.8 F (36.6 C) (01/23 0500) Pulse Rate:  [41-75] 58 (01/23 0500) Resp:  [14-23] 21 (01/23 0500) BP: (96-115)/(46-77) 115/70 mmHg (01/23 0500) SpO2:  [95 %-100 %] 96 % (01/23 0500) Weight:  [337 lb 1.6 oz (152.908 kg)-340 lb 9.6 oz (154.495 kg)] 337 lb 1.6 oz (152.908 kg) (01/23 0500) Last BM Date: 12/22/15  Intake/Output from previous day: 01/22 0701 - 01/23 0700 In: 120 [P.O.:120] Out: 700 [Urine:700] Intake/Output this shift:    Medications Scheduled Meds: . aspirin  324 mg Oral NOW   Or  . aspirin  300 mg Rectal NOW  . aspirin EC  81 mg Oral Daily  . loratadine  10 mg Oral Daily  . pantoprazole  40 mg Oral Daily  . sodium chloride  3 mL Intravenous Q12H   Continuous Infusions:  PRN Meds:.sodium chloride, acetaminophen, nitroGLYCERIN, ondansetron (ZOFRAN) IV, sodium chloride  PE: General appearance: alert, cooperative and no distress Lungs: clear to auscultation bilaterally Heart: Reg rhythm, rate slow, no MRG Extremities: Trace LEE Pulses: 2+ and symmetric Skin: Warm and dry Neurologic: Grossly normal  Lab Results:   Recent Labs  12/23/15 1023 12/24/15 0622  WBC 5.8 6.6  HGB 14.9 14.4  HCT 45.9 44.6  PLT 80* 75*   BMET  Recent Labs  12/23/15 1023 12/24/15 0622  NA 144 142  K 4.8 4.5  CL 106 109  CO2 29 27  GLUCOSE 109* 122*  BUN 17 17  CREATININE 1.47* 1.30*  CALCIUM 9.3 8.9   PT/INR  Recent Labs  12/23/15 1926  LABPROT 13.5  INR 1.01   Cholesterol  Recent Labs  12/24/15 0622  CHOL 183   Lipid Panel     Component Value Date/Time   CHOL 183 12/24/2015 0622   TRIG 149 12/24/2015 0622     HDL 32* 12/24/2015 0622   CHOLHDL 5.7 12/24/2015 0622   VLDL 30 12/24/2015 0622   LDLCALC 121* 12/24/2015 0622   Cardiac Panel (last 3 results)  Recent Labs  12/23/15 1926 12/24/15 0044 12/24/15 0622  TROPONINI 0.04* 0.03 <0.03    Assessment/Plan  Active Problems:   Chest pain   Essential hypertension   Hyperlipidemia   Renal insufficiency   Thrombocytopenia (HCC)   Hyperglycemia   Sinus bradycardia  1. Stable angina - Troponin 0.04 then 0.03, 0.03 I think his pain is most concerning for angina. He does have chronic thrombocytopenia and renal insufficiency of unclear chronicity. SCr improved now(1.30).  Cardiac risk factors include HTN, HLD, morbid obesity and family history of CAD.  ASA.   Platelets 75. No heparin.               IV fluids added @75mg  Per HR for a liter  Added to cath schedule  2. HTN, controlled albeit softer in ER - Holding Lasix and losartan  3. Sinus bradycardia -   TSH WNL.  Appears chronic based on EKG showing HR of 46 in 2012. May need assessment for chronotropic competence as this could be playing a role in his symptoms - atenolol held.  He has untreated sleep  apnea  4. Hyperlipidemia, untreated due to prior h/o abnormal liver function tests - add baseline LFTs.   LDL 121  5. Renal insufficiency of uncertain chronicity -  SCr improved 1.47>>1.30  6. Chronic thrombocytopenia - follow. Hgb is normal.  7. Hyperglycemia -  A1C pending.  8.  OSA He has tried a CPAP in the past and has not been able to get it to fit right(blows air into his eye).  Last Fall he was trying to get another device after a new sleep study but was having issues with his insurance company.  Now he has [place].  This needs OP follow up 9.  Obesity  He has lost some weight in the last six months.  Low carb diet and exercise advised.    Tarri Fuller PA-C 12/24/2015 7:56 AM  The patient was seen, examined and discussed with Tarri Fuller, PA-C and I agree with the above.    The patient with no known CAD, morbid obesity, untreated OSA, impaired glucose tolerance, HTN, HLP, minimally elevated troponin, scheduled for a cath for today. LDL 120 and statins sec to abnormal LFTs in the past, if evidence of CAD, he will need to be referred to our lipid clinic for PCSK 9 inhibitors at discharge.  IV fluids for elevated crea 1.47 --> 1.30.   Dorothy Spark 12/24/2015

## 2015-12-24 NOTE — Progress Notes (Signed)
Echocardiogram 2D Echocardiogram with Definity has been performed.  Tresa Res 12/24/2015, 5:54 PM

## 2015-12-24 NOTE — Progress Notes (Signed)
Subjective: He had CP twice overnight with radiation to his neck and jaw.  Each time it is worse or initiated with exertion(he was in the shower first time) and eases with rest.   NTG SL was given once and helped ease the pain which was easing off already    Objective: Vital signs in last 24 hours: Temp:  [97.8 F (36.6 C)-98.6 F (37 C)] 97.8 F (36.6 C) (01/23 0500) Pulse Rate:  [41-75] 58 (01/23 0500) Resp:  [14-23] 21 (01/23 0500) BP: (96-115)/(46-77) 115/70 mmHg (01/23 0500) SpO2:  [95 %-100 %] 96 % (01/23 0500) Weight:  [337 lb 1.6 oz (152.908 kg)-340 lb 9.6 oz (154.495 kg)] 337 lb 1.6 oz (152.908 kg) (01/23 0500) Last BM Date: 12/22/15  Intake/Output from previous day: 01/22 0701 - 01/23 0700 In: 120 [P.O.:120] Out: 700 [Urine:700] Intake/Output this shift:    Medications Scheduled Meds: . aspirin  324 mg Oral NOW   Or  . aspirin  300 mg Rectal NOW  . aspirin EC  81 mg Oral Daily  . loratadine  10 mg Oral Daily  . pantoprazole  40 mg Oral Daily  . sodium chloride  3 mL Intravenous Q12H   Continuous Infusions:  PRN Meds:.sodium chloride, acetaminophen, nitroGLYCERIN, ondansetron (ZOFRAN) IV, sodium chloride  PE: General appearance: alert, cooperative and no distress Lungs: clear to auscultation bilaterally Heart: Reg rhythm, rate slow, no MRG Extremities: Trace LEE Pulses: 2+ and symmetric Skin: Warm and dry Neurologic: Grossly normal  Lab Results:   Recent Labs  12/23/15 1023 12/24/15 0622  WBC 5.8 6.6  HGB 14.9 14.4  HCT 45.9 44.6  PLT 80* 75*   BMET  Recent Labs  12/23/15 1023 12/24/15 0622  NA 144 142  K 4.8 4.5  CL 106 109  CO2 29 27  GLUCOSE 109* 122*  BUN 17 17  CREATININE 1.47* 1.30*  CALCIUM 9.3 8.9   PT/INR  Recent Labs  12/23/15 1926  LABPROT 13.5  INR 1.01   Cholesterol  Recent Labs  12/24/15 0622  CHOL 183   Lipid Panel     Component Value Date/Time   CHOL 183 12/24/2015 0622   TRIG 149 12/24/2015 0622     HDL 32* 12/24/2015 0622   CHOLHDL 5.7 12/24/2015 0622   VLDL 30 12/24/2015 0622   LDLCALC 121* 12/24/2015 0622   Cardiac Panel (last 3 results)  Recent Labs  12/23/15 1926 12/24/15 0044 12/24/15 0622  TROPONINI 0.04* 0.03 <0.03    Assessment/Plan  Active Problems:   Chest pain   Essential hypertension   Hyperlipidemia   Renal insufficiency   Thrombocytopenia (HCC)   Hyperglycemia   Sinus bradycardia  1. Stable angina - Troponin 0.04 then 0.03, 0.03 I think his pain is most concerning for angina. He does have chronic thrombocytopenia and renal insufficiency of unclear chronicity. SCr improved now(1.30).  Cardiac risk factors include HTN, HLD, morbid obesity and family history of CAD.  ASA.   Platelets 75. No heparin.               IV fluids added @75mg  Per HR for a liter  Added to cath schedule  2. HTN, controlled albeit softer in ER - Holding Lasix and losartan  3. Sinus bradycardia -   TSH WNL.  Appears chronic based on EKG showing HR of 46 in 2012. May need assessment for chronotropic competence as this could be playing a role in his symptoms - atenolol held.  He has untreated sleep  apnea  4. Hyperlipidemia, untreated due to prior h/o abnormal liver function tests - add baseline LFTs.   LDL 121  5. Renal insufficiency of uncertain chronicity -  SCr improved 1.47>>1.30  6. Chronic thrombocytopenia - follow. Hgb is normal.  7. Hyperglycemia -  A1C pending.  8.  OSA He has tried a CPAP in the past and has not been able to get it to fit right(blows air into his eye).  Last Fall he was trying to get another device after a new sleep study but was having issues with his insurance company.  Now he has [place].  This needs OP follow up 9.  Obesity  He has lost some weight in the last six months.  Low carb diet and exercise advised.    Tarri Fuller PA-C 12/24/2015 7:56 AM  The patient was seen, examined and discussed with Tarri Fuller, PA-C and I agree with the above.    The patient with no known CAD, morbid obesity, untreated OSA, impaired glucose tolerance, HTN, HLP, minimally elevated troponin, scheduled for a cath for today. LDL 120 and statins sec to abnormal LFTs in the past, if evidence of CAD, he will need to be referred to our lipid clinic for PCSK 9 inhibitors at discharge.  IV fluids for elevated crea 1.47 --> 1.30.   Dorothy Spark 12/24/2015

## 2015-12-24 NOTE — Interval H&P Note (Signed)
History and Physical Interval Note:  12/24/2015 2:43 PM  Sean [name]  has presented today for surgery, with the diagnosis of unstable angina  The various methods of treatment have been discussed with the patient and family. After consideration of risks, benefits and other options for treatment, the patient has consented to  Procedure(s): Left Heart Cath and Coronary Angiography (N/A) and possible angioplasty as a surgical intervention .  The patient's history has been reviewed, patient examined, no change in status, stable for surgery.  I have reviewed the patient's chart and labs.  Questions were answered to the patient's satisfaction.     Jaylene Arrowood, Quillian Quince

## 2015-12-24 NOTE — Progress Notes (Signed)
Pt c/o headache. Tylenol given. Within 79mins of tylenol given pt called out c/o headache, chest pain that radiates to jaw and arm. Pt states "feels like my left arm is numb and a shooting pain in my jaw." Pt placed on oxygen 4L [place], EKG done. Pain started to ease up per patient report. Oncall MD paged and made aware. No new orders received. Will continue to monitor closely.

## 2015-12-25 ENCOUNTER — Observation Stay (HOSPITAL_COMMUNITY): Payer: BLUE CROSS/BLUE SHIELD

## 2015-12-25 ENCOUNTER — Inpatient Hospital Stay (HOSPITAL_COMMUNITY): Payer: BLUE CROSS/BLUE SHIELD

## 2015-12-25 ENCOUNTER — Encounter (HOSPITAL_COMMUNITY): Payer: Self-pay | Admitting: Internal Medicine

## 2015-12-25 DIAGNOSIS — E7800 Pure hypercholesterolemia, unspecified: Secondary | ICD-10-CM | POA: Diagnosis present

## 2015-12-25 DIAGNOSIS — J9589 Other postprocedural complications and disorders of respiratory system, not elsewhere classified: Secondary | ICD-10-CM | POA: Diagnosis not present

## 2015-12-25 DIAGNOSIS — D696 Thrombocytopenia, unspecified: Secondary | ICD-10-CM | POA: Diagnosis present

## 2015-12-25 DIAGNOSIS — Q240 Dextrocardia: Secondary | ICD-10-CM | POA: Diagnosis not present

## 2015-12-25 DIAGNOSIS — Z8249 Family history of ischemic heart disease and other diseases of the circulatory system: Secondary | ICD-10-CM | POA: Diagnosis not present

## 2015-12-25 DIAGNOSIS — J9811 Atelectasis: Secondary | ICD-10-CM | POA: Diagnosis not present

## 2015-12-25 DIAGNOSIS — I10 Essential (primary) hypertension: Secondary | ICD-10-CM | POA: Diagnosis not present

## 2015-12-25 DIAGNOSIS — R001 Bradycardia, unspecified: Secondary | ICD-10-CM

## 2015-12-25 DIAGNOSIS — Z79899 Other long term (current) drug therapy: Secondary | ICD-10-CM | POA: Diagnosis not present

## 2015-12-25 DIAGNOSIS — Z6841 Body Mass Index (BMI) 40.0 and over, adult: Secondary | ICD-10-CM | POA: Diagnosis not present

## 2015-12-25 DIAGNOSIS — D62 Acute posthemorrhagic anemia: Secondary | ICD-10-CM | POA: Diagnosis not present

## 2015-12-25 DIAGNOSIS — N289 Disorder of kidney and ureter, unspecified: Secondary | ICD-10-CM | POA: Diagnosis present

## 2015-12-25 DIAGNOSIS — G4733 Obstructive sleep apnea (adult) (pediatric): Secondary | ICD-10-CM | POA: Diagnosis present

## 2015-12-25 DIAGNOSIS — I471 Supraventricular tachycardia: Secondary | ICD-10-CM | POA: Diagnosis not present

## 2015-12-25 DIAGNOSIS — I2510 Atherosclerotic heart disease of native coronary artery without angina pectoris: Secondary | ICD-10-CM

## 2015-12-25 DIAGNOSIS — R739 Hyperglycemia, unspecified: Secondary | ICD-10-CM | POA: Diagnosis present

## 2015-12-25 DIAGNOSIS — I25110 Atherosclerotic heart disease of native coronary artery with unstable angina pectoris: Secondary | ICD-10-CM | POA: Diagnosis present

## 2015-12-25 DIAGNOSIS — E785 Hyperlipidemia, unspecified: Secondary | ICD-10-CM | POA: Diagnosis not present

## 2015-12-25 DIAGNOSIS — R079 Chest pain, unspecified: Secondary | ICD-10-CM | POA: Diagnosis present

## 2015-12-25 LAB — SPIROMETRY WITH GRAPH
FEF 25-75 POST: 1.07 L/s
FEF 25-75 PRE: 2.86 L/s
FEF2575-%CHANGE-POST: -62 %
FEF2575-%PRED-POST: 27 %
FEF2575-%PRED-PRE: 72 %
FEV1-%Change-Post: -24 %
FEV1-%PRED-POST: 43 %
FEV1-%Pred-Pre: 57 %
FEV1-PRE: 2.43 L
FEV1-Post: 1.85 L
FEV1FVC-%CHANGE-POST: -12 %
FEV1FVC-%PRED-PRE: 109 %
FEV6-%CHANGE-POST: -13 %
FEV6-%PRED-POST: 46 %
FEV6-%Pred-Pre: 53 %
FEV6-PRE: 2.82 L
FEV6-Post: 2.43 L
FEV6FVC-%Pred-Post: 103 %
FEV6FVC-%Pred-Pre: 103 %
FVC-%Change-Post: -13 %
FVC-%Pred-Post: 45 %
FVC-%Pred-Pre: 52 %
FVC-POST: 2.43 L
FVC-Pre: 2.82 L
POST FEV1/FVC RATIO: 76 %
PRE FEV1/FVC RATIO: 86 %
Post FEV6/FVC ratio: 100 %
Pre FEV6/FVC Ratio: 100 %

## 2015-12-25 LAB — CBC
HEMATOCRIT: 45.2 % (ref 39.0–52.0)
HEMOGLOBIN: 14.9 g/dL (ref 13.0–17.0)
MCH: 32 pg (ref 26.0–34.0)
MCHC: 33 g/dL (ref 30.0–36.0)
MCV: 97 fL (ref 78.0–100.0)
Platelets: 68 10*3/uL — ABNORMAL LOW (ref 150–400)
RBC: 4.66 MIL/uL (ref 4.22–5.81)
RDW: 13.6 % (ref 11.5–15.5)
WBC: 8.5 10*3/uL (ref 4.0–10.5)

## 2015-12-25 LAB — GLUCOSE, CAPILLARY
GLUCOSE-CAPILLARY: 139 mg/dL — AB (ref 65–99)
GLUCOSE-CAPILLARY: 104 mg/dL — AB (ref 65–99)
Glucose-Capillary: 114 mg/dL — ABNORMAL HIGH (ref 65–99)

## 2015-12-25 LAB — BASIC METABOLIC PANEL
ANION GAP: 8 (ref 5–15)
BUN: 15 mg/dL (ref 6–20)
CHLORIDE: 110 mmol/L (ref 101–111)
CO2: 23 mmol/L (ref 22–32)
Calcium: 8.6 mg/dL — ABNORMAL LOW (ref 8.9–10.3)
Creatinine, Ser: 1.2 mg/dL (ref 0.61–1.24)
GFR calc non Af Amer: >60 mL/min (ref >60)
GLUCOSE: 110 mg/dL — AB (ref 65–99)
Potassium: 4.6 mmol/L (ref 3.5–5.1)
Sodium: 141 mmol/L (ref 135–145)

## 2015-12-25 LAB — HEPARIN LEVEL (UNFRACTIONATED)
HEPARIN UNFRACTIONATED: 0.38 [IU]/mL (ref 0.30–0.70)
Heparin Unfractionated: 0.26 IU/mL — ABNORMAL LOW (ref 0.30–0.70)

## 2015-12-25 MED ORDER — CHLORHEXIDINE GLUCONATE 4 % EX LIQD
60.0000 mL | Freq: Once | CUTANEOUS | Status: AC
Start: 2015-12-26 — End: 2015-12-26
  Administered 2015-12-26: 4 via TOPICAL
  Filled 2015-12-25: qty 60

## 2015-12-25 MED ORDER — SODIUM CHLORIDE 0.9 % IV SOLN
INTRAVENOUS | Status: DC
  Filled 2015-12-25: qty 30

## 2015-12-25 MED ORDER — NITROGLYCERIN IN D5W 200-5 MCG/ML-% IV SOLN: 0.0000 ug/min | INTRAVENOUS | Status: DC

## 2015-12-25 MED ORDER — DEXTROSE 5 % IV SOLN
30.0000 ug/min | INTRAVENOUS | Status: AC
  Administered 2015-12-26: 60 ug/min via INTRAVENOUS
  Filled 2015-12-25: qty 2

## 2015-12-25 MED ORDER — DEXMEDETOMIDINE HCL IN NACL 400 MCG/100ML IV SOLN
0.1000 ug/kg/h | INTRAVENOUS | Status: AC
  Administered 2015-12-26: .3 ug/kg/h via INTRAVENOUS
  Filled 2015-12-25: qty 100

## 2015-12-25 MED ORDER — POTASSIUM CHLORIDE 2 MEQ/ML IV SOLN
80.0000 meq | INTRAVENOUS | Status: DC
  Filled 2015-12-25: qty 40

## 2015-12-25 MED ORDER — METOPROLOL TARTRATE 12.5 MG HALF TABLET
12.5000 mg | ORAL_TABLET | Freq: Once | ORAL | Status: AC
Start: 2015-12-26 — End: 2015-12-26
  Administered 2015-12-26: 12.5 mg via ORAL
  Filled 2015-12-25: qty 1

## 2015-12-25 MED ORDER — NITROGLYCERIN IN D5W 200-5 MCG/ML-% IV SOLN
2.0000 ug/min | INTRAVENOUS | Status: DC
  Filled 2015-12-25: qty 250

## 2015-12-25 MED ORDER — IOHEXOL 350 MG/ML SOLN
80.0000 mL | Freq: Once | INTRAVENOUS | Status: AC | PRN
  Administered 2015-12-25: 80 mL via INTRAVENOUS

## 2015-12-25 MED ORDER — SODIUM CHLORIDE 0.9 % IV SOLN
INTRAVENOUS | Status: AC
  Administered 2015-12-26: 69.8 mL/h via INTRAVENOUS
  Filled 2015-12-25: qty 40

## 2015-12-25 MED ORDER — VANCOMYCIN HCL 10 G IV SOLR
1500.0000 mg | INTRAVENOUS | Status: AC
  Administered 2015-12-26: 1500 mg via INTRAVENOUS
  Filled 2015-12-25: qty 1500

## 2015-12-25 MED ORDER — BISACODYL 5 MG PO TBEC: 5.0000 mg | DELAYED_RELEASE_TABLET | Freq: Once | ORAL | Status: DC

## 2015-12-25 MED ORDER — DEXTROSE 5 % IV SOLN
750.0000 mg | INTRAVENOUS | Status: DC
  Filled 2015-12-25: qty 750

## 2015-12-25 MED ORDER — SODIUM CHLORIDE 0.9 % IV SOLN
8.0000 mg | Freq: Four times a day (QID) | INTRAVENOUS | Status: DC
  Administered 2015-12-25 – 2015-12-26 (×2): 8 mg via INTRAVENOUS
  Filled 2015-12-25 (×5): qty 4

## 2015-12-25 MED ORDER — CEFUROXIME SODIUM 1.5 G IJ SOLR
1.5000 g | INTRAMUSCULAR | Status: AC
  Administered 2015-12-26: 1.5 g via INTRAVENOUS
  Filled 2015-12-25: qty 1.5

## 2015-12-25 MED ORDER — CHLORHEXIDINE GLUCONATE 4 % EX LIQD
60.0000 mL | Freq: Once | CUTANEOUS | Status: AC
  Administered 2015-12-25: 4 via TOPICAL
  Filled 2015-12-25: qty 60

## 2015-12-25 MED ORDER — PAPAVERINE HCL 30 MG/ML IJ SOLN
INTRAMUSCULAR | Status: AC
  Administered 2015-12-26: 200 mL
  Filled 2015-12-25: qty 2.5

## 2015-12-25 MED ORDER — VANCOMYCIN HCL 1000 MG IV SOLR
INTRAVENOUS | Status: DC
  Filled 2015-12-25: qty 1000

## 2015-12-25 MED ORDER — SODIUM CHLORIDE 0.9 % IV SOLN
INTRAVENOUS | Status: AC
  Administered 2015-12-26: 1.4 [IU]/h via INTRAVENOUS
  Filled 2015-12-25: qty 2.5

## 2015-12-25 MED ORDER — NITROGLYCERIN IN D5W 200-5 MCG/ML-% IV SOLN
INTRAVENOUS | Status: AC
  Administered 2015-12-25: 5 ug
  Filled 2015-12-25: qty 250

## 2015-12-25 MED ORDER — CHLORHEXIDINE GLUCONATE 0.12 % MT SOLN
15.0000 mL | Freq: Once | OROMUCOSAL | Status: AC
  Administered 2015-12-26: 15 mL via OROMUCOSAL
  Filled 2015-12-25: qty 15

## 2015-12-25 MED ORDER — EPINEPHRINE HCL 1 MG/ML IJ SOLN
0.0000 ug/min | INTRAVENOUS | Status: DC
  Filled 2015-12-25: qty 4

## 2015-12-25 MED ORDER — ALBUTEROL SULFATE (2.5 MG/3ML) 0.083% IN NEBU
2.5000 mg | INHALATION_SOLUTION | Freq: Once | RESPIRATORY_TRACT | Status: AC
  Administered 2015-12-25: 2.5 mg via RESPIRATORY_TRACT

## 2015-12-25 MED ORDER — MAGNESIUM SULFATE 50 % IJ SOLN
40.0000 meq | INTRAMUSCULAR | Status: DC
Start: 2015-12-26 — End: 2015-12-26
  Filled 2015-12-25: qty 10

## 2015-12-25 MED ORDER — DOPAMINE-DEXTROSE 3.2-5 MG/ML-% IV SOLN
0.0000 ug/kg/min | INTRAVENOUS | Status: DC
  Filled 2015-12-25: qty 250

## 2015-12-25 MED ORDER — TEMAZEPAM 15 MG PO CAPS
15.0000 mg | ORAL_CAPSULE | Freq: Once | ORAL | Status: DC | PRN
Start: 2015-12-25 — End: 2015-12-26

## 2015-12-25 MED ORDER — PROMETHAZINE HCL 25 MG/ML IJ SOLN: 12.5000 mg | INTRAMUSCULAR | Status: DC | PRN

## 2015-12-25 NOTE — Progress Notes (Signed)
Discussed sternal precautions, mobility, IS, and d/c planning with pt, mother, and aunt. Voiced understanding. Pt in bed but sts he can't sit up by himself now, while he doesn't have sternal precautions. RN sts he has had mother cleaning him in bathroom and feeding him. Encouraged pt that mobility is very important post op. Gave OHS booklet, guideline, and video to watch. RN to bring IS. [place] CES, ACSM 3:15 PM 12/25/2015

## 2015-12-25 NOTE — Progress Notes (Signed)
Pt nauseous, vomiting clear liquid, called Cardiology, orders to give IVPB zofran, will continue to monitor closely.

## 2015-12-25 NOTE — Consult Note (Signed)
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 The patient describes a long-standing history of exertional shortness of breath dating back more than 10 years.  Over the past months or so the patient has experienced progressive exertional shortness breath and decreased exercise tolerance. Several days ago he began to experience substernal chest pressure or heaviness that was brought on with physical activity and typically relieved by rest. Symptoms accelerated over the last several days until he developed an episode of chest discomfort and shortness of breath at rest.  He presented to the emergency department where EKG revealed sinus bradycardia without ischemic changes. Serial troponin levels have been essentially flat or very slightly elevated, with peak troponin reported 0.04. The patient has continued to experience intermittent chest discomfort and shortness of breath at rest that has been alleviated by nitroglycerin.  The patient underwent diagnostic cardiac catheterization by Dr. Haroldine Laws and was found to have critical high-grade proximal stenosis of the left anterior descending coronary artery. Left ventricular systolic function is preserved. Cardiothoracic surgical consultation was requested.  The patient is single and lives locally in [place] with his parents.  He is a Administrator. He has been morbidly obese for much of his adult life. He describes long-standing chronic exertional shortness of breath but otherwise reports no other significant  physical limitations.   Past Medical History  Diagnosis Date  . Hypercholesteremia     a. Prev taken off statin due to abnormal liver function.  . Hypertension   . Morbid obesity ([place])   . Abnormal liver enzymes     a. Sees a doctor in [place].  . Thrombocytopenia ([place])   . Hematuria     a. Chronic hx of this, no prior etiology determined through workup per patient.  . Sinus bradycardia     Past Surgical History  Procedure Laterality Date  . Gallbladder surgery    . Appendectomy    . Hernia repair    . Cardiac catheterization N/A 12/24/2015    Procedure: Right/Left Heart Cath and Coronary Angiography;  Surgeon: Jolaine Artist, MD;  Location: [facility];  Service: Cardiovascular;  Laterality: N/A;    Family History  Problem Relation Age of Onset  . Heart attack    . Hyperlipidemia    . CAD Father     2 stents ~ 73    Social History   Social History  . Marital Status: Legally Separated    Spouse Name: N/A  . Number of Children: N/A  . Years of Education: N/A   Occupational History  . Truck Geophysicist/field seismologist    Social History Main Topics  . Smoking status: Never Smoker   . Smokeless tobacco: Not on file  . Alcohol Use: 0.0 oz/week    0 Standard drinks or equivalent per week     Comment: Once a month  . Drug Use: No  . Sexual Activity: Not on file   Other Topics Concern  . Not on  file   Social History Narrative    Prior to Admission medications   Medication Sig Start Date End Date Taking? Authorizing Provider  atenolol (TENORMIN) 25 MG tablet Take 25 mg by mouth daily.   Yes Historical Provider, MD  cetirizine (ZYRTEC) 10 MG tablet Take 10 mg by mouth daily.   Yes Historical Provider, MD  furosemide (LASIX) 40 MG tablet Take 40 mg by mouth daily.   Yes Historical Provider, MD  losartan (COZAAR) 100 MG tablet Take 100 mg by mouth daily.   Yes Historical Provider, MD  omeprazole (PRILOSEC) 20 MG capsule Take 20 mg by mouth daily.   Yes Historical Provider,  MD    Current Facility-Administered Medications  Medication Dose Route Frequency Provider Last Rate Last Dose  . 0.9 %  sodium chloride infusion  250 mL Intravenous PRN Jolaine Artist, MD 10 mL/hr at 12/25/15 0700 250 mL at 12/25/15 0700  . acetaminophen (TYLENOL) tablet 650 mg  650 mg Oral Q4H PRN Jolaine Artist, MD   650 mg at 12/24/15 1854  . aspirin EC tablet 81 mg  81 mg Oral Daily Dayna N Dunn, PA-C   81 mg at 12/25/15 1006  . heparin ADULT infusion 100 units/mL (25000 units/250 mL)  1,500 Units/hr Intravenous Continuous Darl Pikes, RPH 15 mL/hr at 12/25/15 1205 1,500 Units/hr at 12/25/15 1205  . loratadine (CLARITIN) tablet 10 mg  10 mg Oral Daily Dayna N Dunn, PA-C   10 mg at 12/25/15 1006  . nitroGLYCERIN (NITROSTAT) SL tablet 0.4 mg  0.4 mg Sublingual Q5 Min x 3 PRN Dayna N Dunn, PA-C   0.4 mg at 12/25/15 0932  . ondansetron (ZOFRAN) injection 4 mg  4 mg Intravenous Q6H PRN Jolaine Artist, MD      . pantoprazole (PROTONIX) EC tablet 40 mg  40 mg Oral Daily Dayna N Dunn, PA-C   40 mg at 12/24/15 2157  . rosuvastatin (CRESTOR) tablet 40 mg  40 mg Oral q1800 Jolaine Artist, MD   40 mg at 12/24/15 1735  . sodium chloride 0.9 % injection 3 mL  3 mL Intravenous Q12H Jolaine Artist, MD   3 mL at 12/24/15 2011  . sodium chloride 0.9 % injection 3 mL  3 mL Intravenous PRN Jolaine Artist, MD        No Known Allergies    Review of Systems:   General:  normal appetite, normal energy, no weight gain, no weight loss, no fever  Cardiac:  + chest pain with exertion, + chest pain at rest, + SOB with exertion, no resting SOB, no PND, no orthopnea, no palpitations, no arrhythmia, no atrial fibrillation, + chronic LE edema, no dizzy spells, no syncope  Respiratory:  + shortness of breath, no home oxygen, no productive cough, no dry cough, no bronchitis, no wheezing, no hemoptysis, no asthma, no pain with inspiration or cough, possible sleep apnea, no CPAP at  night  GI:   no difficulty swallowing, no reflux, no frequent heartburn, no hiatal hernia, no abdominal pain, no constipation, no diarrhea, no hematochezia, no hematemesis, no melena  GU:   no dysuria,  no frequency, no urinary tract infection, no hematuria, no enlarged prostate, no kidney stones, + kidney disease  Vascular:  no pain suggestive of claudication, no pain in feet, no leg cramps, no varicose veins, no DVT, no non-healing foot ulcer  Neuro:   no stroke, no TIA's, no seizures, no headaches, no temporary blindness one eye,  no slurred speech, no peripheral neuropathy, no chronic pain, no instability of gait, no memory/cognitive dysfunction  Musculoskeletal: no arthritis , no joint swelling, no myalgias, no difficulty walking, normal mobility   Skin:   no rash, no itching, no skin infections, no pressure sores or ulcerations  Psych:   no anxiety, no depression, no nervousness, no unusual recent stress  Eyes:   no blurry vision, no floaters, no recent vision changes, does not wear glasses or contacts  ENT:   no hearing loss, no loose or painful teeth, no dentures, last saw dentist many years ago  Hematologic:  no easy bruising, no abnormal bleeding, no clotting disorder, no frequent epistaxis  Endocrine:  no diabetes, does not check CBG's at home     Physical Exam:   BP 137/84 mmHg  Pulse 66  Temp(Src) 97.6 F (36.4 C) (Oral)  Resp 16  Ht 5\' 11"  (1.803 m)  Wt 152.6 kg (336 lb 6.8 oz)  BMI 46.94 kg/m2  SpO2 98%  General:  Morbidly obese, o/w  well-appearing  HEENT:  Unremarkable   Neck:   no JVD, no bruits, no adenopathy   Chest:   clear to auscultation, symmetrical breath sounds, no wheezes, no rhonchi   CV:   RRR, no murmur   Abdomen:  soft, non-tender, no masses   Extremities:  warm, well-perfused, pulses diminished, + lower extremity edema  Rectal/GU  Deferred  Neuro:   Grossly non-focal and symmetrical throughout  Skin:   Clean and dry, no rashes, no  breakdown  Diagnostic Tests:  CARDIAC CATHETERIZATION Procedures    Right/Left Heart Cath and Coronary Angiography    Conclusion     Prox RCA lesion, 20% stenosed.  Mid RCA to Dist RCA lesion, 20% stenosed.  Ost LAD to Prox LAD lesion, 95% stenosed.  Mid LAD lesion, 40% stenosed.  Ost 1st Mrg to 1st Mrg lesion, 40% stenosed.  Assessment:  1. Severe 1v CAD with high-grade ostial LAD lesion (95%+) 2. 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 Elevated right sided pressures. Unable to complete RHC due to kinking of venous sheath  RA 15  RV 44/18 4. Probable normal LV function   Plan/discussion:  He has high-grade ostial LAD disease in setting of what appears to be congenital heart disease with possible dextrocardia and significantly elevated R-sided pressures. 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 Case reviewed with Dr. Gwenlyn Found. Will plan echo and possible CT to further evaluate. Although LAD lesion may be approachable percutaneously with low platelets long-term DAPT may not be best option. Will request TCTS consult for further planning once imaging studies completed.    Bensimhon, Daniel,MD 4:29 PM       Indications    Unstable angina ([place]) [I20.0 (ICD-10-CM)]    Technique and Indications    The risks and indication of the procedure were explained. Consent was signed and placed on the chart. An appropriate timeout was taken prior to the procedure. After a normal Allen's test was confirmed, the right wrist was prepped and draped in the routine sterile fashion and anesthetized with 1% local lidocaine.   A 5 FR arterial sheath was then placed in the right radial artery using a modified Seldinger technique. 3mg  IV verapamil was given through the sheath. 200ug of interarterial NTG was given We had significant difficulty advancing the  JR4 catheter through the right arm circulation. Arteriography showed very narrow vessels.  We were able to get into the central circulation however it then became clear that his heart was rotated abnormally and we could not easily cannulate his coronaries from the radial approach. He also developed significant arm pain so the radial approach was abandoned and attention was turned to the right femoral area.4:12 PM  The right groin was prepped and draped in the routine sterile fashion and anesthetized with 1% local lidocaine.  A 5 FR arterial sheath was placed in the right femoral artery using a modified Seldinger technique. Standard catheters including a JL5, JR4 and angled pigtail were used. All catheter exchanges were made over a wire. A 7 FR venous sheath was placed in the right femoral vein using a modified Seldinger technique. A standard Swan-Ganz catheter was used for the procedure. We were unable to complete the RHC due to significant kinking of the venous sheath. All sheaths were then removed and manual pressure held.     Estimated blood loss <50 mL. There were no immediate complications during the procedure.    Coronary Findings    Dominance: Right   Left Anterior Descending   . Ost LAD to Prox LAD lesion, 95% stenosed.   . Mid LAD lesion, 40% stenosed.     Left Circumflex   . First Obtuse Marginal Branch   . Ost 1st Mrg to 1st Mrg lesion, 40% stenosed.     Right Coronary Artery   . Prox RCA lesion, 20% stenosed.   . Mid RCA to Dist RCA lesion, 20% stenosed.      Coronary Diagrams    Diagnostic Diagram            Implants    Name ID Temporary Type Supply   No information to display    PACS Images    Show images for Cardiac catheterization     Link to Procedure Log    Procedure Log      Hemo Data       Most Recent Value   RA A Wave  19 mmHg   RA V Wave  15 mmHg   RA Mean  15 mmHg   RV Systolic Pressure  44 mmHg   RV Diastolic Pressure  15  mmHg   RV EDP  18 mmHg   AO Systolic Pressure  [id number] mmHg   AO Diastolic Pressure  79 mmHg   AO Mean  97 mmHg   LV Systolic Pressure  [id number] mmHg   LV Diastolic Pressure  14 mmHg   LV EDP  22 mmHg   Arterial Occlusion Pressure Extended Systolic Pressure  0000000 mmHg   Arterial Occlusion Pressure Extended Diastolic Pressure  71 mmHg   Arterial Occlusion Pressure Extended Mean Pressure  95 mmHg   Left Ventricular Apex Extended Systolic Pressure  [id number] mmHg   Left Ventricular Apex Extended Diastolic Pressure  22 mmHg   Left Ventricular Apex Extended EDP Pressure  22 mmHg     Transthoracic Echocardiography  Patient:  Karlin, Patricio MR #:    [id number] Study Date: 12/24/2015 Gender:   M Age:    42 Height:   180.3 cm Weight:   152.4 kg BSA:    2.84 m^2 Pt. Status: Room:    [facility]  ADMITTING  Sherren Mocha, MD SONOGRAPHER Tresa Res, RDCS ORDERING   Bensimhon, Glynis Smiles  Bensimhon, Daniel PERFORMING  [facility], Inpatient ATTENDING  Will Curt Bears, MD  cc:  ------------------------------------------------------------------- LV EF: 60% -  65%  ------------------------------------------------------------------- Indications:   Chest pain 786.51.  -------------------------------------------------------------------  History:  Risk factors: Hypertension. Morbidly obese. Hypercholesterolemia.  ------------------------------------------------------------------- Study Conclusions  - Left ventricle: The cavity size was normal. There was mild concentric hypertrophy. Systolic function was normal. The estimated ejection fraction was in the range of 60% to 65%. Wall motion was normal; there were no regional wall motion abnormalities. The study is not technically sufficient to allow evaluation of LV diastolic function. - Poor acoustic windows limited study quality.  Transthoracic echocardiography. M-mode, complete 2D,  spectral Doppler, and color Doppler. Birthdate: Patient birthdate: [birth year]. Age: Patient is 45 yr old. Sex: Gender: male. BMI: 46.9 kg/m^2. Blood pressure:   157/77 Patient status: Inpatient. Study date: Study date: 12/24/2015. Study time: 04:45 PM. Location: ICU/CCU  -------------------------------------------------------------------  ------------------------------------------------------------------- Left ventricle: The cavity size was normal. There was mild concentric hypertrophy. Systolic function was normal. The estimated ejection fraction was in the range of 60% to 65%. Wall motion was normal; there were no regional wall motion abnormalities. The study is not technically sufficient to allow evaluation of LV diastolic function.  ------------------------------------------------------------------- Aortic valve: Not visualized. Doppler: Transvalvular velocity was within the normal range. There was no stenosis. There was no regurgitation.  ------------------------------------------------------------------- Aorta: Aortic root: The aortic root was normal in size.  ------------------------------------------------------------------- Mitral valve:  Structurally normal valve.  Mobility was not restricted. Doppler: Transvalvular velocity was within the normal range. There was no evidence for stenosis. There was no regurgitation.  ------------------------------------------------------------------- Left atrium: The atrium was normal in size.  ------------------------------------------------------------------- Right ventricle: The cavity size was normal. Wall thickness was normal. Systolic function was normal.  ------------------------------------------------------------------- Pulmonic valve:  Poorly visualized.  ------------------------------------------------------------------- Tricuspid valve:  Structurally normal valve.  Doppler: Transvalvular  velocity was within the normal range. There was no regurgitation.  ------------------------------------------------------------------- Pulmonary artery:  The main pulmonary artery was normal-sized. Systolic pressure was within the normal range.  ------------------------------------------------------------------- Right atrium: The atrium was normal in size.  ------------------------------------------------------------------- Pericardium: There was no pericardial effusion.  ------------------------------------------------------------------- Systemic veins: Inferior vena cava: Not visualized.  ------------------------------------------------------------------- Measurements  Left ventricle              Value    Reference LV ID, ED, PLAX chordal         44.8 mm   43 - 52 LV ID, ES, PLAX chordal         29.6 mm   23 - 38 LV fx shortening, PLAX chordal      34  %   >=29 LV PW thickness, ED           12.3 mm   --------- IVS/LV PW ratio, ED           0.91     <=1.3  Ventricular septum            Value    Reference IVS thickness, ED            11.2 mm   ---------  LVOT                   Value    Reference LVOT ID, S                25  mm   --------- LVOT area                4.91 cm^2  ---------  Aorta                  Value    Reference Aortic root ID, ED  32  mm   ---------  Left atrium               Value    Reference LA ID, A-P, ES              34  mm   --------- LA ID/bsa, A-P              1.2  cm/m^2 <=2.2  Mitral valve               Value    Reference Mitral E-wave peak velocity       48  cm/s  --------- Mitral A-wave peak velocity       45.1 cm/s   --------- Mitral deceleration time         165  ms   150 - 230 Mitral E/A ratio, peak          1.1     ---------  Legend: (L) and (H) mark values outside specified reference range.  ------------------------------------------------------------------- Prepared and Electronically Authenticated by  Skeet Latch, MD 2017-01-23T18:25:23   Cardiac/Coronary CT TECHNIQUE: The patient was scanned on a Philips 256 scanner. FINDINGS: A 120 kV prospective scan was triggered in the descending thoracic aorta at 111 HU's. Axial non-contrast 63mm slices were carried out through the heart. The data set was analyzed on a dedicated work station and scored using the [place]. Gantry rotation speed was 270 msecs and collimation was .9 mm. No beta blockade or NTG was given. The 3D data set was reconstructed in 5% intervals of the 67-82 % of the R-R cycle. Diastolic phases were analyzed on a dedicated work station using MPR, MIP and VRT modes. The patient received 80 cc of contrast. Aorta: Normal caliber, no calcifications or dissection. Normal origin of the brachiocephalic arteries. Aortic Valve: Trileaflet, no calcifications. Coronary Arteries: Originating in a normal position. Right dominance. Left main is short with no obvious plaque. Ostial to proximal LAD has a severe mixed plaque associated with > 90% stenosis. The plaque has almost circumferential calcifications but also has non-calcified portion. There is another mild calcified plaque in teh mid LAD associated with 25-50% stenosis. LCX is nondominant vessel. There is mild calcified plaque in OM1 associated with 25-50% stenosis. RCA is a dominant vessel with minimal calcifications in the proximal and mid portion. Other findings: The heart is rotated anteriorly with apex-base axis directed antero-posteriorly, no obvious dextrocardia present. No other anomalies such as situs inversus were seen. Mild  concentric hypertrophy. Dilated pulmonary artery measuring 30 x 36 mm suggestive of pulmonary hypertension. Normal size of the right sided chambers. Normal pulmonary veins draining into the left atrium. No ASD or VSD. There is a possible small PFO. IMPRESSION: 1. Coronary calcium score of 327. This was 19 percentile for age and sex matched control. 2. Severe obstructive CAD in the ostial to proximal LAD with mixed plaque associated with > 90% stenosis. Only mild non-obstructive plaque in LCX and RCA. 3. Anterior rotation of the cardiac apex, no obvious dextrocardia or other anomalies such as situs inversus. 4. Mild concentric LVH. 5. Dilated pulmonary artery suggestive of pulmonary hypertension. 6. Possible PFO. Ena Dawley Electronically Signed  By: Ena Dawley  On: 12/25/2015 15:46   Impression:  Patient has critical high-grade proximal stenosis of the left anterior descending coronary artery. He presents with unstable angina and left ventricular systolic function is preserved. Options include PCI and stenting versus surgical revascularization. Given his relatively young age, history of thrombocytopenia, and concerns  about his ability to take long-term dual antiplatelet therapy I agree that he might best be treated with surgical revascularization.  Plan:  I have reviewed the indications, risks, and potential benefits of coronary artery bypass grafting with the patient and his entire family.  Alternative treatment strategies have been discussed, including the relative risks, benefits and long term prognosis associated with medical therapy, percutaneous coronary intervention, and surgical revascularization.  The patient understands and accepts all potential associated risks of surgery including but not limited to risk of death, stroke or other neurologic complication, myocardial infarction, congestive heart failure, respiratory failure, renal failure, bleeding requiring blood  transfusion and/or reexploration, aortic dissection or other major vascular complication, arrhythmia, heart block or bradycardia requiring permanent pacemaker, pneumonia, pleural effusion, wound infection, pulmonary embolus or other thromboembolic complication, chronic pain or other delayed complications related to median sternotomy, or the late recurrence of symptomatic ischemic heart disease and/or congestive heart failure.  The importance of long term risk modification have been emphasized.  All questions answered.  We plan to proceed with single-vessel coronary artery bypass grafting in the operating room tomorrow.   I spent in excess of 120 minutes during the conduct of this hospital consultation and >50% of this time involved direct face-to-face encounter for counseling and/or coordination of the patient's care.    Valentina Gu. Roxy Manns, MD 12/25/2015 2:34 PM

## 2015-12-25 NOTE — Procedures (Signed)
Pt declined to have an ABG.  Pt states that he "just wants to sleep." Pt states that he may be willing to comply in the morning.

## 2015-12-25 NOTE — Progress Notes (Signed)
ANTICOAGULATION CONSULT NOTE - Initial Consult  Pharmacy Consult for heparin Indication: chest pain/ACS  No Known Allergies  Patient Measurements: Height: 5\' 11"  (180.3 cm) Weight: (!) 336 lb 6.8 oz (152.6 kg) IBW/kg (Calculated) : 75.3 Heparin Dosing Weight: 111.7kg  Vital Signs: Temp: 98.4 F (36.9 C) (01/24 0900) Temp Source: Oral (01/24 0900) BP: 126/85 mmHg (01/24 0937) Pulse Rate: 83 (01/24 0937)  Labs:  Recent Labs  12/23/15 1023 12/23/15 1926 12/24/15 0044 12/24/15 0622 12/25/15 0327  HGB 14.9  --   --  14.4  --   HCT 45.9  --   --  44.6  --   PLT 80*  --   --  75*  --   LABPROT  --  13.5  --   --   --   INR  --  1.01  --   --   --   CREATININE 1.47*  --   --  1.30* 1.20  TROPONINI  --  0.04* 0.03 <0.03  --     Estimated Creatinine Clearance: 119.2 mL/min (by C-G formula based on Cr of 1.2).   Medical History: Past Medical History  Diagnosis Date  . Hypercholesteremia     a. Prev taken off statin due to abnormal liver function.  . Hypertension   . Morbid obesity ([place])   . Abnormal liver enzymes     a. Sees a doctor in [place].  . Thrombocytopenia ([place])   . Hematuria     a. Chronic hx of this, no prior etiology determined through workup per patient.  . Sinus bradycardia     Medications:  Infusions:  . heparin 1,300 Units/hr (12/25/15 0017)    Assessment: 61 yom presented with chest pain now s/p cardiac cath and heparin started. Hgb 14.4, plt 80>75 (known to have chronic thrombocytopenia), no bleeding reported  HL 0.26 (slightly subtherapeutic)   Goal of Therapy:  Heparin level 0.3-0.7 units/ml Monitor platelets by anticoagulation protocol: Yes   Plan:  - Increase heparin gtt to 1500 units/hr - Check a 6 hour heparin level - Daily heparin level and CBC - Monitor plts   Darl Pikes, PharmD Clinical Pharmacist- Resident Pager: [phone]   Darl Pikes 12/25/2015,10:52 AM

## 2015-12-25 NOTE — Progress Notes (Signed)
Pre-op Cardiac Surgery  Carotid Findings:  No significant extracranial carotid artery stenosis demonstrated. Vertebrals are patent with antegrade flow.  Upper Extremity Right Left  Brachial Pressures 106 Triphasic IV site Triphasic  Radial Waveforms  Triphasic  Triphasic  Ulnar Waveforms  Triphasic  Triphasic  Palmar Arch (Allen's Test) Decrease radial artery>50% normal   Findings:  Palmar arch evaluation-Doppler waveforms remained normal; with left radial and both ulnar compression-  Right Radial artery decrease >50% with compression.    Lower  Extremity Right Left  Dorsalis Pedis 155 Triphasic 148Triphasic  Posterior Tibial 179Triphasic 186Triphasic  Ankle/Brachial Indices 1.68 1.75    Findings:  ABIs and Doppler waveform bilaterally are with in normal limits at rest.  Janifer Adie, RVT, RDMS [phone], 6:00 pm

## 2015-12-25 NOTE — CV Procedure (Signed)
Cardiac cath results:   Prox RCA lesion, 20% stenosed.  Mid RCA to Dist RCA lesion, 20% stenosed.  Ost LAD to Prox LAD lesion, 95% stenosed.  Mid LAD lesion, 40% stenosed.  Ost 1st Mrg to 1st Mrg lesion, 40% stenosed.  Assessment:  1. Severe 1v CAD with high-grade ostial LAD lesion (95%+) 2. 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 Elevated right sided pressures. Unable to complete RHC due to kinking of venous sheath  RA 15  RV 44/18 4. Probable normal LV function   Plan/discussion:  He has high-grade ostial LAD disease in setting of what appears to be congenital heart disease with possible dextrocardia and significantly elevated R-sided pressures. 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 Case reviewed with Dr. Gwenlyn Found. Will plan echo and possible CT to further evaluate. Although LAD lesion may be approachable percutaneously with low platelets long-term DAPT may not be best option. Will request TCTS consult for further planning once imaging studies completed.    Crystalina Stodghill,MD

## 2015-12-25 NOTE — Progress Notes (Signed)
ANTICOAGULATION CONSULT NOTE - Follow-Up Consult  Pharmacy Consult for heparin Indication: chest pain/ACS  No Known Allergies  Patient Measurements: Height: 5\' 11"  (180.3 cm) Weight: (!) 336 lb 6.8 oz (152.6 kg) IBW/kg (Calculated) : 75.3 Heparin Dosing Weight: 111.7kg  Vital Signs: Temp: 98.3 F (36.8 C) (01/24 1600) Temp Source: Oral (01/24 1600) BP: 121/76 mmHg (01/24 1900) Pulse Rate: 61 (01/24 1900)  Labs:  Recent Labs  12/23/15 1023 12/23/15 1926 12/24/15 0044 12/24/15 0622 12/25/15 0327 12/25/15 0948 12/25/15 1819  HGB 14.9  --   --  14.4  --  14.9  --   HCT 45.9  --   --  44.6  --  45.2  --   PLT 80*  --   --  75*  --  68*  --   LABPROT  --  13.5  --   --   --   --   --   INR  --  1.01  --   --   --   --   --   HEPARINUNFRC  --   --   --   --   --  0.26* 0.38  CREATININE 1.47*  --   --  1.30* 1.20  --   --   TROPONINI  --  0.04* 0.03 <0.03  --   --   --     Estimated Creatinine Clearance: 119.2 mL/min (by C-G formula based on Cr of 1.2).   Medical History: Past Medical History  Diagnosis Date  . Hypercholesteremia     a. Prev taken off statin due to abnormal liver function.  . Hypertension   . Morbid obesity ([place])   . Abnormal liver enzymes     a. Sees a doctor in [place].  . Thrombocytopenia ([place])   . Hematuria     a. Chronic hx of this, no prior etiology determined through workup per patient.  . Sinus bradycardia     Medications:  Infusions:  . heparin 1,500 Units/hr (12/25/15 1710)  . nitroGLYCERIN 15 mcg/min (12/25/15 1420)    Assessment: 45 yo M presented with chest pain now s/p cardiac cath with plans for single vessel CABG tomorrow.   Heparin restarted at 1500 units/ hr and is therapeutic.  Noted plans to stop hepatin tomorrow morning at 0500 for surgery.   Goal of Therapy:  Heparin level 0.3-0.7 units/ml Monitor platelets by anticoagulation protocol: Yes   Plan:  Continue heparin gtt at 1500 units/hr Heparin off 1/25 at  0500 Follow-up after CABG  [facility], Pharm.D., BCPS Clinical Pharmacist Pager [phone] 12/25/2015 8:11 PM

## 2015-12-25 NOTE — Progress Notes (Addendum)
Subjective: No chest pain but headache sec to NTG.  While having PFTs done, he developed left arm pain radiating to his neck, relieved by sl NTG.   Objective: Vital signs in last 24 hours: Temp:  [97.5 F (36.4 C)-98.3 F (36.8 C)] 98.1 F (36.7 C) (01/24 0400) Pulse Rate:  [42-85] 55 (01/24 0700) Resp:  [10-27] 15 (01/24 0700) BP: (103-176)/(55-93) 131/82 mmHg (01/24 0700) SpO2:  [94 %-100 %] 99 % (01/24 0700) Weight:  [336 lb 6.8 oz (152.6 kg)] 336 lb 6.8 oz (152.6 kg) (01/23 1633) Last BM Date: 12/22/15  Intake/Output from previous day: 01/23 0701 - 01/24 0700 In: 1658.2 [P.O.:480; I.V.:1178.2] Out: 1250 [Urine:1250] Intake/Output this shift:    Medications Scheduled Meds: . aspirin EC  81 mg Oral Daily  . loratadine  10 mg Oral Daily  . pantoprazole  40 mg Oral Daily  . rosuvastatin  40 mg Oral q1800  . sodium chloride  3 mL Intravenous Q12H   Continuous Infusions: . heparin 1,300 Units/hr (12/25/15 0017)   PRN Meds:.sodium chloride, acetaminophen, nitroGLYCERIN, ondansetron (ZOFRAN) IV, sodium chloride  PE: General appearance: alert, cooperative and no distress Lungs: clear to auscultation bilaterally Heart: Reg rhythm, rate slow, no MRG Extremities: Trace LEE Pulses: 2+ and symmetric Skin: Warm and dry Neurologic: Grossly normal  Lab Results:   Recent Labs  12/23/15 1023 12/24/15 0622  WBC 5.8 6.6  HGB 14.9 14.4  HCT 45.9 44.6  PLT 80* 75*   BMET  Recent Labs  12/23/15 1023 12/24/15 0622 12/25/15 0327  NA 144 142 141  K 4.8 4.5 4.6  CL 106 109 110  CO2 29 27 23   GLUCOSE 109* 122* 110*  BUN 17 17 15   CREATININE 1.47* 1.30* 1.20  CALCIUM 9.3 8.9 8.6*   PT/INR  Recent Labs  12/23/15 1926  LABPROT 13.5  INR 1.01   Cholesterol  Recent Labs  12/24/15 0622  CHOL 183   Lipid Panel     Component Value Date/Time   CHOL 183 12/24/2015 0622   TRIG 149 12/24/2015 0622   HDL 32* 12/24/2015 0622   CHOLHDL 5.7 12/24/2015 0622   VLDL 30 12/24/2015 0622   LDLCALC 121* 12/24/2015 0622   Cardiac Panel (last 3 results)  Recent Labs  12/23/15 1926 12/24/15 0044 12/24/15 0622  TROPONINI 0.04* 0.03 <0.03    Assessment/Plan  Active Problems:   Chest pain   Essential hypertension   Hyperlipidemia   Renal insufficiency   Thrombocytopenia (HCC)   Hyperglycemia   Sinus bradycardia   Unstable angina ([place])  1. CAD - with findings of high-grade ostial LAD lesion (95%+) with dextrocardia, evaluated by CT surgery and scheduled for tomorrow for CABG. We will perform Cardiac CT to further evaluate the anatomy. We will hydrate, he had elevated crea on admission, now improved. Start NTG drip for ongoing chest pain.   2. HTN, uncontrolled, no BB as he is bradycardic, held ACEI/ARB sec to an acute renal failure, we will start NTG drip.  3. Sinus bradycardia -   TSH WNL.  Appears chronic based on EKG showing HR of 46 in 2012. May need assessment for chronotropic competence as this could be playing a role in his symptoms - atenolol held.  He has untreated sleep apnea  4. Hyperlipidemia, untreated due to prior h/o abnormal liver function tests - add baseline LFTs.   LDL 121  5. Renal insufficiency of uncertain chronicity -  SCr improved 1.47>>1.30>>1.2  6. Chronic thrombocytopenia - follow.  Hgb is normal.  7. Hyperglycemia -  A1C pending.  8.  OSA He has tried a CPAP in the past and has not been able to get it to fit right(blows air into his eye).  Last Fall he was trying to get another device after a new sleep study but was having issues with his insurance company.  Now he has [place].  This needs OP follow up 9.  Obesity  He has lost some weight in the last six months.  Low carb diet and exercise advised.    Dorothy Spark 12/25/2015

## 2015-12-26 ENCOUNTER — Inpatient Hospital Stay (HOSPITAL_COMMUNITY): Payer: BLUE CROSS/BLUE SHIELD | Admitting: Anesthesiology

## 2015-12-26 ENCOUNTER — Inpatient Hospital Stay (HOSPITAL_COMMUNITY): Payer: BLUE CROSS/BLUE SHIELD

## 2015-12-26 ENCOUNTER — Encounter (HOSPITAL_COMMUNITY): Payer: Self-pay | Admitting: Thoracic Surgery (Cardiothoracic Vascular Surgery)

## 2015-12-26 ENCOUNTER — Encounter (HOSPITAL_COMMUNITY)
Admission: EM | Disposition: A | Discharge status: Home / Self Care | Payer: Self-pay | Source: Home / Self Care | Attending: Thoracic Surgery (Cardiothoracic Vascular Surgery)

## 2015-12-26 DIAGNOSIS — I25110 Atherosclerotic heart disease of native coronary artery with unstable angina pectoris: Secondary | ICD-10-CM

## 2015-12-26 DIAGNOSIS — Z951 Presence of aortocoronary bypass graft: Secondary | ICD-10-CM | POA: Insufficient documentation

## 2015-12-26 DIAGNOSIS — I200 Unstable angina: Secondary | ICD-10-CM

## 2015-12-26 HISTORY — PX: CORONARY ARTERY BYPASS GRAFT: SHX141

## 2015-12-26 HISTORY — PX: TEE WITHOUT CARDIOVERSION: SHX5443

## 2015-12-26 HISTORY — DX: Presence of aortocoronary bypass graft: Z95.1

## 2015-12-26 LAB — URINE MICROSCOPIC-ADD ON

## 2015-12-26 LAB — POCT I-STAT 3, ART BLOOD GAS (G3+)
ACID-BASE DEFICIT: 3 mmol/L — AB (ref 0.0–2.0)
ACID-BASE DEFICIT: 4 mmol/L — AB (ref 0.0–2.0)
ACID-BASE DEFICIT: 4 mmol/L — AB (ref 0.0–2.0)
Acid-base deficit: 3 mmol/L — ABNORMAL HIGH (ref 0.0–2.0)
Acid-base deficit: 3 mmol/L — ABNORMAL HIGH (ref 0.0–2.0)
Acid-base deficit: 4 mmol/L — ABNORMAL HIGH (ref 0.0–2.0)
BICARBONATE: 23.2 meq/L (ref 20.0–24.0)
BICARBONATE: 23.6 meq/L (ref 20.0–24.0)
BICARBONATE: 22.8 meq/L (ref 20.0–24.0)
Bicarbonate: 23.9 mEq/L (ref 20.0–24.0)
Bicarbonate: 23.9 mEq/L (ref 20.0–24.0)
Bicarbonate: 24 mEq/L (ref 20.0–24.0)
O2 SAT: 92 %
O2 SAT: 97 %
O2 SAT: 96 %
O2 SAT: 96 %
O2 Saturation: 95 %
O2 Saturation: 99 %
PCO2 ART: 46.1 mmHg — AB (ref 35.0–45.0)
PCO2 ART: 53.7 mmHg — AB (ref 35.0–45.0)
PCO2 ART: 52 mmHg — AB (ref 35.0–45.0)
PCO2 ART: 47.5 mmHg — AB (ref 35.0–45.0)
PCO2 ART: 50.8 mmHg — AB (ref 35.0–45.0)
PH ART: 7.323 — AB (ref 7.350–7.450)
PH ART: 7.318 — AB (ref 7.350–7.450)
PH ART: 7.252 — AB (ref 7.350–7.450)
PH ART: 7.268 — AB (ref 7.350–7.450)
PH ART: 7.262 — AB (ref 7.350–7.450)
PO2 ART: 70 mmHg — AB (ref 80.0–100.0)
PO2 ART: 130 mmHg — AB (ref 80.0–100.0)
PO2 ART: 100 mmHg (ref 80.0–100.0)
Patient temperature: 35.9
Patient temperature: 96.8
Patient temperature: 97.5
Patient temperature: 99.5
Patient temperature: 99.5
TCO2: 25 mmol/L (ref 0–100)
TCO2: 25 mmol/L (ref 0–100)
TCO2: 26 mmol/L (ref 0–100)
TCO2: 26 mmol/L (ref 0–100)
TCO2: 25 mmol/L (ref 0–100)
TCO2: 24 mmol/L (ref 0–100)
pCO2 arterial: 44.7 mmHg (ref 35.0–45.0)
pH, Arterial: 7.307 — ABNORMAL LOW (ref 7.350–7.450)
pO2, Arterial: 78 mmHg — ABNORMAL LOW (ref 80.0–100.0)
pO2, Arterial: 98 mmHg (ref 80.0–100.0)
pO2, Arterial: 93 mmHg (ref 80.0–100.0)

## 2015-12-26 LAB — POCT I-STAT, CHEM 8
BUN: 13 mg/dL (ref 6–20)
BUN: 11 mg/dL (ref 6–20)
BUN: 12 mg/dL (ref 6–20)
BUN: 13 mg/dL (ref 6–20)
CALCIUM ION: 1.2 mmol/L (ref 1.12–1.23)
CALCIUM ION: 1.23 mmol/L (ref 1.12–1.23)
CALCIUM ION: 1.25 mmol/L — AB (ref 1.12–1.23)
CHLORIDE: 104 mmol/L (ref 101–111)
CREATININE: 1.2 mg/dL (ref 0.61–1.24)
Calcium, Ion: 1.25 mmol/L — ABNORMAL HIGH (ref 1.12–1.23)
Chloride: 104 mmol/L (ref 101–111)
Chloride: 104 mmol/L (ref 101–111)
Chloride: 105 mmol/L (ref 101–111)
Creatinine, Ser: 1 mg/dL (ref 0.61–1.24)
Creatinine, Ser: 1.1 mg/dL (ref 0.61–1.24)
Creatinine, Ser: 1.1 mg/dL (ref 0.61–1.24)
GLUCOSE: 135 mg/dL — AB (ref 65–99)
GLUCOSE: 137 mg/dL — AB (ref 65–99)
GLUCOSE: 137 mg/dL — AB (ref 65–99)
Glucose, Bld: 128 mg/dL — ABNORMAL HIGH (ref 65–99)
HCT: 39 % (ref 39.0–52.0)
HCT: 41 % (ref 39.0–52.0)
HEMATOCRIT: 42 % (ref 39.0–52.0)
HEMATOCRIT: 38 % — AB (ref 39.0–52.0)
HEMOGLOBIN: 12.9 g/dL — AB (ref 13.0–17.0)
Hemoglobin: 14.3 g/dL (ref 13.0–17.0)
Hemoglobin: 13.3 g/dL (ref 13.0–17.0)
Hemoglobin: 13.9 g/dL (ref 13.0–17.0)
Potassium: 4.8 mmol/L (ref 3.5–5.1)
Potassium: 4.5 mmol/L (ref 3.5–5.1)
Potassium: 4.6 mmol/L (ref 3.5–5.1)
Potassium: 4.5 mmol/L (ref 3.5–5.1)
SODIUM: 140 mmol/L (ref 135–145)
SODIUM: 139 mmol/L (ref 135–145)
SODIUM: 142 mmol/L (ref 135–145)
Sodium: 139 mmol/L (ref 135–145)
TCO2: 28 mmol/L (ref 0–100)
TCO2: 24 mmol/L (ref 0–100)
TCO2: 26 mmol/L (ref 0–100)
TCO2: 24 mmol/L (ref 0–100)

## 2015-12-26 LAB — BASIC METABOLIC PANEL
Anion gap: 16 — ABNORMAL HIGH (ref 5–15)
BUN: 11 mg/dL (ref 6–20)
CO2: 23 mmol/L (ref 22–32)
Calcium: 8.5 mg/dL — ABNORMAL LOW (ref 8.9–10.3)
Chloride: 102 mmol/L (ref 101–111)
Creatinine, Ser: 1.31 mg/dL — ABNORMAL HIGH (ref 0.61–1.24)
GFR calc Af Amer: >60 mL/min (ref >60)
GLUCOSE: 142 mg/dL — AB (ref 65–99)
POTASSIUM: 4 mmol/L (ref 3.5–5.1)
Sodium: 141 mmol/L (ref 135–145)

## 2015-12-26 LAB — GLUCOSE, CAPILLARY
GLUCOSE-CAPILLARY: 127 mg/dL — AB (ref 65–99)
GLUCOSE-CAPILLARY: 123 mg/dL — AB (ref 65–99)
Glucose-Capillary: 122 mg/dL — ABNORMAL HIGH (ref 65–99)
Glucose-Capillary: 129 mg/dL — ABNORMAL HIGH (ref 65–99)
Glucose-Capillary: 129 mg/dL — ABNORMAL HIGH (ref 65–99)
Glucose-Capillary: 125 mg/dL — ABNORMAL HIGH (ref 65–99)

## 2015-12-26 LAB — CBC
HCT: 42.5 % (ref 39.0–52.0)
HCT: 39.4 % (ref 39.0–52.0)
HEMATOCRIT: 36.4 % — AB (ref 39.0–52.0)
HEMOGLOBIN: 13.8 g/dL (ref 13.0–17.0)
Hemoglobin: 12.1 g/dL — ABNORMAL LOW (ref 13.0–17.0)
Hemoglobin: 12.7 g/dL — ABNORMAL LOW (ref 13.0–17.0)
MCH: 31.7 pg (ref 26.0–34.0)
MCH: 31.9 pg (ref 26.0–34.0)
MCH: 31.1 pg (ref 26.0–34.0)
MCHC: 32.5 g/dL (ref 30.0–36.0)
MCHC: 33.2 g/dL (ref 30.0–36.0)
MCHC: 32.2 g/dL (ref 30.0–36.0)
MCV: 97.5 fL (ref 78.0–100.0)
MCV: 96 fL (ref 78.0–100.0)
MCV: 96.6 fL (ref 78.0–100.0)
PLATELETS: 59 10*3/uL — AB (ref 150–400)
Platelets: 81 10*3/uL — ABNORMAL LOW (ref 150–400)
Platelets: 70 10*3/uL — ABNORMAL LOW (ref 150–400)
RBC: 4.36 MIL/uL (ref 4.22–5.81)
RBC: 3.79 MIL/uL — AB (ref 4.22–5.81)
RBC: 4.08 MIL/uL — AB (ref 4.22–5.81)
RDW: 13.5 % (ref 11.5–15.5)
RDW: 13.4 % (ref 11.5–15.5)
RDW: 13.3 % (ref 11.5–15.5)
WBC: 8.2 10*3/uL (ref 4.0–10.5)
WBC: 11.7 10*3/uL — AB (ref 4.0–10.5)
WBC: 12.8 10*3/uL — ABNORMAL HIGH (ref 4.0–10.5)

## 2015-12-26 LAB — URINALYSIS, ROUTINE W REFLEX MICROSCOPIC
Bilirubin Urine: NEGATIVE
Glucose, UA: NEGATIVE mg/dL
Ketones, ur: NEGATIVE mg/dL
Leukocytes, UA: NEGATIVE
Nitrite: NEGATIVE
Protein, ur: NEGATIVE mg/dL
SPECIFIC GRAVITY, URINE: 1.02 (ref 1.005–1.030)
pH: 5.5 (ref 5.0–8.0)

## 2015-12-26 LAB — POCT I-STAT 4, (NA,K, GLUC, HGB,HCT)
GLUCOSE: 131 mg/dL — AB (ref 65–99)
Glucose, Bld: 143 mg/dL — ABNORMAL HIGH (ref 65–99)
HCT: 37 % — ABNORMAL LOW (ref 39.0–52.0)
HEMATOCRIT: 35 % — AB (ref 39.0–52.0)
HEMOGLOBIN: 12.6 g/dL — AB (ref 13.0–17.0)
HEMOGLOBIN: 11.9 g/dL — AB (ref 13.0–17.0)
POTASSIUM: 4.6 mmol/L (ref 3.5–5.1)
POTASSIUM: 4.3 mmol/L (ref 3.5–5.1)
SODIUM: 139 mmol/L (ref 135–145)
SODIUM: 139 mmol/L (ref 135–145)

## 2015-12-26 LAB — HEPARIN LEVEL (UNFRACTIONATED): HEPARIN UNFRACTIONATED: 0.54 [IU]/mL (ref 0.30–0.70)

## 2015-12-26 LAB — APTT
APTT: 70 s — AB (ref 24–37)
APTT: 28 s (ref 24–37)

## 2015-12-26 LAB — CREATININE, SERUM
CREATININE: 1.25 mg/dL — AB (ref 0.61–1.24)
GFR calc Af Amer: >60 mL/min (ref >60)

## 2015-12-26 LAB — MAGNESIUM: MAGNESIUM: 2.6 mg/dL — AB (ref 1.7–2.4)

## 2015-12-26 LAB — TYPE AND SCREEN
ABO/RH(D): A POS
Antibody Screen: NEGATIVE

## 2015-12-26 LAB — PROTIME-INR
INR: 1.17 (ref 0.00–1.49)
Prothrombin Time: 15.1 seconds (ref 11.6–15.2)

## 2015-12-26 LAB — ABO/RH: ABO/RH(D): A POS

## 2015-12-26 SURGERY — CORONARY ARTERY BYPASS GRAFTING (CABG)
Anesthesia: General | Site: Esophagus

## 2015-12-26 MED ORDER — PANTOPRAZOLE SODIUM 40 MG PO TBEC: 40.0000 mg | DELAYED_RELEASE_TABLET | Freq: Every day | ORAL | Status: DC

## 2015-12-26 MED ORDER — ONDANSETRON HCL 4 MG/2ML IJ SOLN: 4.0000 mg | Freq: Four times a day (QID) | INTRAMUSCULAR | Status: DC | PRN

## 2015-12-26 MED ORDER — LIDOCAINE HCL (CARDIAC) 20 MG/ML IV SOLN
INTRAVENOUS | Status: AC
  Filled 2015-12-26: qty 5

## 2015-12-26 MED ORDER — PROPOFOL 10 MG/ML IV BOLUS
INTRAVENOUS | Status: AC
  Filled 2015-12-26: qty 40

## 2015-12-26 MED ORDER — DEXTROSE 5 % IV SOLN
1.5000 g | Freq: Two times a day (BID) | INTRAVENOUS | Status: AC
  Administered 2015-12-26 – 2015-12-28 (×4): 1.5 g via INTRAVENOUS
  Filled 2015-12-26 (×4): qty 1.5

## 2015-12-26 MED ORDER — DOCUSATE SODIUM 100 MG PO CAPS
200.0000 mg | ORAL_CAPSULE | Freq: Every day | ORAL | Status: DC
  Administered 2015-12-30: 200 mg via ORAL
  Filled 2015-12-26 (×3): qty 2

## 2015-12-26 MED ORDER — METOPROLOL TARTRATE 25 MG/10 ML ORAL SUSPENSION: 12.5000 mg | Freq: Two times a day (BID) | ORAL | Status: DC

## 2015-12-26 MED ORDER — MORPHINE SULFATE (PF) 2 MG/ML IV SOLN
1.0000 mg | INTRAVENOUS | Status: DC | PRN
  Administered 2015-12-26: 2 mg via INTRAVENOUS
  Administered 2015-12-26 (×2): 1 mg via INTRAVENOUS
  Filled 2015-12-26 (×4): qty 1

## 2015-12-26 MED ORDER — PROPOFOL 10 MG/ML IV BOLUS
INTRAVENOUS | Status: DC | PRN
  Administered 2015-12-26: 170 mg via INTRAVENOUS

## 2015-12-26 MED ORDER — ACETAMINOPHEN 160 MG/5ML PO SOLN
1000.0000 mg | Freq: Four times a day (QID) | ORAL | Status: DC
  Administered 2015-12-27 (×2): 1000 mg
  Filled 2015-12-26 (×2): qty 40.6

## 2015-12-26 MED ORDER — CHLORHEXIDINE GLUCONATE 0.12 % MT SOLN
15.0000 mL | OROMUCOSAL | Status: AC
  Administered 2015-12-26: 15 mL via OROMUCOSAL

## 2015-12-26 MED ORDER — ACETAMINOPHEN 650 MG RE SUPP
650.0000 mg | Freq: Once | RECTAL | Status: AC
  Administered 2015-12-26: 650 mg via RECTAL

## 2015-12-26 MED ORDER — HEPARIN SODIUM (PORCINE) 1000 UNIT/ML IJ SOLN
INTRAMUSCULAR | Status: AC
  Filled 2015-12-26: qty 1

## 2015-12-26 MED ORDER — DEXMEDETOMIDINE HCL IN NACL 200 MCG/50ML IV SOLN
0.0000 ug/kg/h | INTRAVENOUS | Status: DC
  Administered 2015-12-26: 0.2 ug/kg/h via INTRAVENOUS
  Administered 2015-12-27 (×2): 0.4 ug/kg/h via INTRAVENOUS
  Filled 2015-12-26 (×3): qty 50

## 2015-12-26 MED ORDER — ROCURONIUM BROMIDE 50 MG/5ML IV SOLN
INTRAVENOUS | Status: AC
  Filled 2015-12-26: qty 2

## 2015-12-26 MED ORDER — CHLORHEXIDINE GLUCONATE 0.12% ORAL RINSE (MEDLINE KIT)
15.0000 mL | Freq: Two times a day (BID) | OROMUCOSAL | Status: DC
  Administered 2015-12-26 – 2015-12-27 (×2): 15 mL via OROMUCOSAL

## 2015-12-26 MED ORDER — LACTATED RINGERS IV SOLN
INTRAVENOUS | Status: DC
  Administered 2015-12-26: 20 mL/h via INTRAVENOUS

## 2015-12-26 MED ORDER — SODIUM CHLORIDE 0.9% FLUSH
3.0000 mL | Freq: Two times a day (BID) | INTRAVENOUS | Status: DC
  Administered 2015-12-27 – 2015-12-30 (×5): 3 mL via INTRAVENOUS

## 2015-12-26 MED ORDER — INSULIN REGULAR BOLUS VIA INFUSION
0.0000 [IU] | Freq: Three times a day (TID) | INTRAVENOUS | Status: DC
  Filled 2015-12-26: qty 10

## 2015-12-26 MED ORDER — ASPIRIN EC 325 MG PO TBEC
325.0000 mg | DELAYED_RELEASE_TABLET | Freq: Every day | ORAL | Status: DC
  Administered 2015-12-28 – 2015-12-31 (×4): 325 mg via ORAL
  Filled 2015-12-26 (×4): qty 1

## 2015-12-26 MED ORDER — LIDOCAINE HCL (CARDIAC) 20 MG/ML IV SOLN
INTRAVENOUS | Status: DC | PRN
  Administered 2015-12-26: 80 mg via INTRAVENOUS

## 2015-12-26 MED ORDER — OXYCODONE HCL 5 MG PO TABS
5.0000 mg | ORAL_TABLET | ORAL | Status: DC | PRN
  Administered 2015-12-27 – 2015-12-30 (×9): 10 mg via ORAL
  Administered 2015-12-31: 5 mg via ORAL
  Filled 2015-12-26 (×6): qty 2
  Filled 2015-12-26: qty 1
  Filled 2015-12-26 (×3): qty 2

## 2015-12-26 MED ORDER — LACTATED RINGERS IV SOLN
INTRAVENOUS | Status: DC | PRN
  Administered 2015-12-26: 07:00:00 via INTRAVENOUS

## 2015-12-26 MED ORDER — MIDAZOLAM HCL 2 MG/2ML IJ SOLN
2.0000 mg | INTRAMUSCULAR | Status: DC | PRN
  Administered 2015-12-27: 2 mg via INTRAVENOUS
  Filled 2015-12-26: qty 2

## 2015-12-26 MED ORDER — MIDAZOLAM HCL 10 MG/2ML IJ SOLN
INTRAMUSCULAR | Status: AC
  Filled 2015-12-26: qty 2

## 2015-12-26 MED ORDER — PROTAMINE SULFATE 10 MG/ML IV SOLN
INTRAVENOUS | Status: AC
  Filled 2015-12-26: qty 25

## 2015-12-26 MED ORDER — NITROGLYCERIN IN D5W 200-5 MCG/ML-% IV SOLN: 0.0000 ug/min | INTRAVENOUS | Status: DC

## 2015-12-26 MED ORDER — BISACODYL 5 MG PO TBEC
10.0000 mg | DELAYED_RELEASE_TABLET | Freq: Every day | ORAL | Status: DC
  Administered 2015-12-30: 10 mg via ORAL
  Filled 2015-12-26 (×3): qty 2

## 2015-12-26 MED ORDER — ANTISEPTIC ORAL RINSE SOLUTION (CORINZ)
7.0000 mL | Freq: Four times a day (QID) | OROMUCOSAL | Status: DC
  Administered 2015-12-26 – 2015-12-29 (×6): 7 mL via OROMUCOSAL

## 2015-12-26 MED ORDER — MIDAZOLAM HCL 5 MG/5ML IJ SOLN
INTRAMUSCULAR | Status: DC | PRN
  Administered 2015-12-26: 3 mg via INTRAVENOUS
  Administered 2015-12-26: 4 mg via INTRAVENOUS
  Administered 2015-12-26: 1 mg via INTRAVENOUS
  Administered 2015-12-26: 2 mg via INTRAVENOUS

## 2015-12-26 MED ORDER — SODIUM CHLORIDE 0.9 % IV SOLN
INTRAVENOUS | Status: DC
  Filled 2015-12-26 (×2): qty 2.5

## 2015-12-26 MED ORDER — MORPHINE SULFATE (PF) 2 MG/ML IV SOLN
2.0000 mg | INTRAVENOUS | Status: DC | PRN
  Administered 2015-12-27: 2 mg via INTRAVENOUS

## 2015-12-26 MED ORDER — SODIUM CHLORIDE 0.45 % IV SOLN
INTRAVENOUS | Status: DC | PRN
  Administered 2015-12-26: 20 mL/h via INTRAVENOUS

## 2015-12-26 MED ORDER — METOPROLOL TARTRATE 12.5 MG HALF TABLET: 12.5000 mg | ORAL_TABLET | Freq: Two times a day (BID) | ORAL | Status: DC

## 2015-12-26 MED ORDER — LACTATED RINGERS IV SOLN: 500.0000 mL | Freq: Once | INTRAVENOUS | Status: DC | PRN

## 2015-12-26 MED ORDER — ASPIRIN 81 MG PO CHEW: 324.0000 mg | CHEWABLE_TABLET | Freq: Every day | ORAL | Status: DC

## 2015-12-26 MED ORDER — PHENYLEPHRINE HCL 10 MG/ML IJ SOLN
0.0000 ug/min | INTRAVENOUS | Status: DC
  Filled 2015-12-26: qty 2

## 2015-12-26 MED ORDER — ARTIFICIAL TEARS OP OINT
TOPICAL_OINTMENT | OPHTHALMIC | Status: AC
  Filled 2015-12-26: qty 3.5

## 2015-12-26 MED ORDER — HEPARIN SODIUM (PORCINE) 1000 UNIT/ML IJ SOLN
INTRAMUSCULAR | Status: DC | PRN
  Administered 2015-12-26: 17000 [IU] via INTRAVENOUS

## 2015-12-26 MED ORDER — POTASSIUM CHLORIDE 10 MEQ/50ML IV SOLN
10.0000 meq | INTRAVENOUS | Status: AC
Start: 2015-12-26 — End: 2015-12-26

## 2015-12-26 MED ORDER — LACTATED RINGERS IV SOLN
INTRAVENOUS | Status: DC | PRN
  Administered 2015-12-26 (×2): via INTRAVENOUS

## 2015-12-26 MED ORDER — SODIUM CHLORIDE 0.9% FLUSH: 3.0000 mL | INTRAVENOUS | Status: DC | PRN

## 2015-12-26 MED ORDER — BISACODYL 10 MG RE SUPP: 10.0000 mg | Freq: Every day | RECTAL | Status: DC

## 2015-12-26 MED ORDER — MAGNESIUM SULFATE 4 GM/100ML IV SOLN
4.0000 g | Freq: Once | INTRAVENOUS | Status: AC
  Administered 2015-12-26: 4 g via INTRAVENOUS
  Filled 2015-12-26: qty 100

## 2015-12-26 MED ORDER — FENTANYL CITRATE (PF) 100 MCG/2ML IJ SOLN
INTRAMUSCULAR | Status: DC | PRN
  Administered 2015-12-26: 150 ug via INTRAVENOUS
  Administered 2015-12-26: 250 ug via INTRAVENOUS
  Administered 2015-12-26: 50 ug via INTRAVENOUS
  Administered 2015-12-26 (×2): 250 ug via INTRAVENOUS
  Administered 2015-12-26: 100 ug via INTRAVENOUS
  Administered 2015-12-26: 250 ug via INTRAVENOUS
  Administered 2015-12-26: 200 ug via INTRAVENOUS

## 2015-12-26 MED ORDER — HEMOSTATIC AGENTS (NO CHARGE) OPTIME
TOPICAL | Status: DC | PRN
  Administered 2015-12-26: 5 via TOPICAL

## 2015-12-26 MED ORDER — ALBUMIN HUMAN 5 % IV SOLN
INTRAVENOUS | Status: DC | PRN
  Administered 2015-12-26: 12:00:00 via INTRAVENOUS

## 2015-12-26 MED ORDER — FENTANYL CITRATE (PF) 250 MCG/5ML IJ SOLN
INTRAMUSCULAR | Status: AC
  Filled 2015-12-26: qty 30

## 2015-12-26 MED ORDER — SODIUM CHLORIDE 0.9 % IV SOLN
INTRAVENOUS | Status: DC
  Administered 2015-12-26: 10 mL/h via INTRAVENOUS

## 2015-12-26 MED ORDER — 0.9 % SODIUM CHLORIDE (POUR BTL) OPTIME
TOPICAL | Status: DC | PRN
  Administered 2015-12-26: 2000 mL

## 2015-12-26 MED ORDER — ALBUMIN HUMAN 5 % IV SOLN: 250.0000 mL | INTRAVENOUS | Status: DC | PRN

## 2015-12-26 MED ORDER — ESMOLOL HCL 100 MG/10ML IV SOLN
INTRAVENOUS | Status: AC
  Filled 2015-12-26: qty 10

## 2015-12-26 MED ORDER — VANCOMYCIN HCL IN DEXTROSE 1-5 GM/200ML-% IV SOLN
1000.0000 mg | Freq: Once | INTRAVENOUS | Status: AC
  Administered 2015-12-26: 1000 mg via INTRAVENOUS
  Filled 2015-12-26: qty 200

## 2015-12-26 MED ORDER — ACETAMINOPHEN 160 MG/5ML PO SOLN: 650.0000 mg | Freq: Once | ORAL | Status: AC

## 2015-12-26 MED ORDER — SUCCINYLCHOLINE CHLORIDE 20 MG/ML IJ SOLN
INTRAMUSCULAR | Status: AC
  Filled 2015-12-26: qty 1

## 2015-12-26 MED ORDER — FAMOTIDINE IN NACL 20-0.9 MG/50ML-% IV SOLN
20.0000 mg | Freq: Two times a day (BID) | INTRAVENOUS | Status: AC
  Administered 2015-12-26 (×2): 20 mg via INTRAVENOUS
  Filled 2015-12-26: qty 50

## 2015-12-26 MED ORDER — SUCCINYLCHOLINE CHLORIDE 20 MG/ML IJ SOLN
INTRAMUSCULAR | Status: DC | PRN
  Administered 2015-12-26: 160 mg via INTRAVENOUS

## 2015-12-26 MED ORDER — SODIUM CHLORIDE 0.9 % IV SOLN: 250.0000 mL | INTRAVENOUS | Status: DC

## 2015-12-26 MED ORDER — EPHEDRINE SULFATE 50 MG/ML IJ SOLN
INTRAMUSCULAR | Status: AC
  Filled 2015-12-26: qty 1

## 2015-12-26 MED ORDER — ACETAMINOPHEN 500 MG PO TABS
1000.0000 mg | ORAL_TABLET | Freq: Four times a day (QID) | ORAL | Status: DC
  Administered 2015-12-27 – 2015-12-31 (×13): 1000 mg via ORAL
  Filled 2015-12-26 (×13): qty 2

## 2015-12-26 MED ORDER — SODIUM CHLORIDE 0.9 % IJ SOLN
INTRAMUSCULAR | Status: AC
  Filled 2015-12-26: qty 20

## 2015-12-26 MED ORDER — METOPROLOL TARTRATE 1 MG/ML IV SOLN
2.5000 mg | INTRAVENOUS | Status: DC | PRN
Start: 2015-12-26 — End: 2015-12-31

## 2015-12-26 MED ORDER — TRAMADOL HCL 50 MG PO TABS
50.0000 mg | ORAL_TABLET | ORAL | Status: DC | PRN
  Administered 2015-12-30 (×2): 100 mg via ORAL
  Filled 2015-12-26 (×2): qty 2

## 2015-12-26 MED ORDER — SODIUM CHLORIDE 0.9 % IV SOLN
INTRAVENOUS | Status: DC
  Administered 2015-12-26: 100 mL/h via INTRAVENOUS

## 2015-12-26 MED ORDER — PROTAMINE SULFATE 10 MG/ML IV SOLN
INTRAVENOUS | Status: DC | PRN
  Administered 2015-12-26: 150 mg via INTRAVENOUS

## 2015-12-26 MED ORDER — ROCURONIUM BROMIDE 100 MG/10ML IV SOLN
INTRAVENOUS | Status: DC | PRN
  Administered 2015-12-26: 50 mg via INTRAVENOUS
  Administered 2015-12-26: 20 mg via INTRAVENOUS
  Administered 2015-12-26: 50 mg via INTRAVENOUS
  Administered 2015-12-26: 20 mg via INTRAVENOUS

## 2015-12-26 MED ORDER — LACTATED RINGERS IV SOLN
INTRAVENOUS | Status: DC
  Administered 2015-12-26: 10 mL/h via INTRAVENOUS

## 2015-12-26 SURGICAL SUPPLY — 91 items
BAG DECANTER FOR FLEXI CONT (MISCELLANEOUS) ×6 IMPLANT
BLADE STERNUM SYSTEM 6 (BLADE) ×3 IMPLANT
BLADE SURG ROTATE 9660 (MISCELLANEOUS) IMPLANT
CANISTER SUCTION 2500CC (MISCELLANEOUS) ×4 IMPLANT
CATH THORACIC 36FR (CATHETERS) ×3 IMPLANT
CLIP RETRACTION 3.0MM CORONARY (MISCELLANEOUS) ×2 IMPLANT
CLIP TI MEDIUM 24 (CLIP) IMPLANT
CLIP TI WIDE RED SMALL 24 (CLIP) IMPLANT
CRADLE DONUT ADULT HEAD (MISCELLANEOUS) ×3 IMPLANT
DRAIN CHANNEL 32F RND 10.7 FF (WOUND CARE) ×6 IMPLANT
DRAPE CARDIOVASCULAR INCISE (DRAPES) ×3
DRAPE INCISE IOBAN 66X45 STRL (DRAPES) ×3 IMPLANT
DRAPE SLUSH/WARMER DISC (DRAPES) ×3 IMPLANT
DRAPE SRG 135X102X78XABS (DRAPES) ×2 IMPLANT
DRSG AQUACEL AG ADV 3.5X14 (GAUZE/BANDAGES/DRESSINGS) ×2 IMPLANT
DRSG COVADERM 4X14 (GAUZE/BANDAGES/DRESSINGS) ×3 IMPLANT
ELECT BLADE 4.0 EZ CLEAN MEGAD (MISCELLANEOUS) ×3
ELECT REM PT RETURN 9FT ADLT (ELECTROSURGICAL) ×6
ELECTRODE BLDE 4.0 EZ CLN MEGD (MISCELLANEOUS) IMPLANT
ELECTRODE REM PT RTRN 9FT ADLT (ELECTROSURGICAL) ×4 IMPLANT
GAUZE SPONGE 4X4 12PLY STRL (GAUZE/BANDAGES/DRESSINGS) ×6 IMPLANT
GLOVE ORTHO TXT STRL SZ7.5 (GLOVE) ×6 IMPLANT
GOWN STRL REUS W/ TWL LRG LVL3 (GOWN DISPOSABLE) ×8 IMPLANT
GOWN STRL REUS W/TWL LRG LVL3 (GOWN DISPOSABLE) ×12
HEMOSTAT POWDER SURGIFOAM 1G (HEMOSTASIS) ×13 IMPLANT
INSERT FOGARTY XLG (MISCELLANEOUS) ×3 IMPLANT
KIT BASIN OR (CUSTOM PROCEDURE TRAY) ×3 IMPLANT
KIT ROOM TURNOVER OR (KITS) ×3 IMPLANT
KIT SUCTION CATH 14FR (SUCTIONS) ×9 IMPLANT
KIT VASOVIEW W/TROCAR VH 2000 (KITS) ×3 IMPLANT
LEAD PACING MYOCARDI (MISCELLANEOUS) ×3 IMPLANT
NS IRRIG 1000ML POUR BTL (IV SOLUTION) ×15 IMPLANT
PACK OPEN HEART (CUSTOM PROCEDURE TRAY) ×3 IMPLANT
PAD ARMBOARD 7.5X6 YLW CONV (MISCELLANEOUS) ×6 IMPLANT
PAD ELECT DEFIB RADIOL ZOLL (MISCELLANEOUS) ×3 IMPLANT
PENCIL BUTTON HOLSTER BLD 10FT (ELECTRODE) ×3 IMPLANT
PUNCH AORTIC ROTATE 4.0MM (MISCELLANEOUS) IMPLANT
PUNCH AORTIC ROTATE 4.5MM 8IN (MISCELLANEOUS) IMPLANT
PUNCH AORTIC ROTATE 5MM 8IN (MISCELLANEOUS) IMPLANT
SOLUTION ANTI FOG 6CC (MISCELLANEOUS) IMPLANT
SPONGE GAUZE 4X4 12PLY STER LF (GAUZE/BANDAGES/DRESSINGS) ×1 IMPLANT
SPONGE LAP 18X18 X RAY DECT (DISPOSABLE) IMPLANT
SPONGE LAP 4X18 X RAY DECT (DISPOSABLE) IMPLANT
SUT BONE WAX W31G (SUTURE) ×3 IMPLANT
SUT ETHIBOND X763 2 0 SH 1 (SUTURE) ×6 IMPLANT
SUT MNCRL AB 3-0 PS2 18 (SUTURE) ×6 IMPLANT
SUT MNCRL AB 4-0 PS2 18 (SUTURE) IMPLANT
SUT PDS AB 1 CTX 36 (SUTURE) ×6 IMPLANT
SUT PROLENE 2 0 SH DA (SUTURE) IMPLANT
SUT PROLENE 3 0 SH DA (SUTURE) ×3 IMPLANT
SUT PROLENE 3 0 SH1 36 (SUTURE) IMPLANT
SUT PROLENE 4 0 RB 1 (SUTURE) ×3
SUT PROLENE 4 0 SH DA (SUTURE) IMPLANT
SUT PROLENE 4-0 RB1 .5 CRCL 36 (SUTURE) IMPLANT
SUT PROLENE 5 0 C 1 36 (SUTURE) ×2 IMPLANT
SUT PROLENE 6 0 C 1 30 (SUTURE) ×2 IMPLANT
SUT PROLENE 7.0 RB 3 (SUTURE) ×9 IMPLANT
SUT PROLENE 8 0 BV175 6 (SUTURE) ×6 IMPLANT
SUT PROLENE BLUE 7 0 (SUTURE) ×3 IMPLANT
SUT PROLENE POLY MONO (SUTURE) IMPLANT
SUT SILK  1 MH (SUTURE) ×4
SUT SILK 1 MH (SUTURE) ×2 IMPLANT
SUT SILK 1 TIES 10X30 (SUTURE) ×1 IMPLANT
SUT SILK 2 0 SH CR/8 (SUTURE) ×2 IMPLANT
SUT SILK 2 0 TIES 10X30 (SUTURE) ×1 IMPLANT
SUT SILK 3 0 SH CR/8 (SUTURE) ×1 IMPLANT
SUT SILK 4 0 TIE 10X30 (SUTURE) ×1 IMPLANT
SUT STEEL 6MS V (SUTURE) IMPLANT
SUT STEEL STERNAL CCS#1 18IN (SUTURE) IMPLANT
SUT STEEL SZ 6 DBL 3X14 BALL (SUTURE) ×3 IMPLANT
SUT VIC AB 1 CTX 18 (SUTURE) ×1 IMPLANT
SUT VIC AB 1 CTX 36 (SUTURE) ×3
SUT VIC AB 1 CTX36XBRD ANBCTR (SUTURE) IMPLANT
SUT VIC AB 2-0 CT1 27 (SUTURE)
SUT VIC AB 2-0 CT1 TAPERPNT 27 (SUTURE) IMPLANT
SUT VIC AB 2-0 CTX 27 (SUTURE) ×4 IMPLANT
SUT VIC AB 3-0 SH 27 (SUTURE)
SUT VIC AB 3-0 SH 27X BRD (SUTURE) IMPLANT
SUT VIC AB 3-0 X1 27 (SUTURE) IMPLANT
SUT VICRYL 4-0 PS2 18IN ABS (SUTURE) IMPLANT
SUTURE E-PAK OPEN HEART (SUTURE) ×3 IMPLANT
SYS GUIDANT ACHIEVE OFF PUMP (MISCELLANEOUS) ×1 IMPLANT
SYSTEM SAHARA CHEST DRAIN ATS (WOUND CARE) ×3 IMPLANT
TAPE CLOTH SURG 4X10 WHT LF (GAUZE/BANDAGES/DRESSINGS) ×1 IMPLANT
TAPE PAPER 2X10 WHT MICROPORE (GAUZE/BANDAGES/DRESSINGS) ×1 IMPLANT
TAPES RETRACTO (MISCELLANEOUS) ×4 IMPLANT
TOWEL OR 17X24 6PK STRL BLUE (TOWEL DISPOSABLE) ×6 IMPLANT
TOWEL OR 17X26 10 PK STRL BLUE (TOWEL DISPOSABLE) ×6 IMPLANT
TRAY FOLEY IC TEMP SENS 16FR (CATHETERS) ×3 IMPLANT
UNDERPAD 30X30 INCONTINENT (UNDERPADS AND DIAPERS) ×3 IMPLANT
WATER STERILE IRR 1000ML POUR (IV SOLUTION) ×6 IMPLANT

## 2015-12-26 NOTE — Progress Notes (Signed)
ANTICOAGULATION CONSULT NOTE - Follow-Up Consult  Pharmacy Consult for heparin Indication: chest pain/ACS  No Known Allergies  Patient Measurements: Height: 5\' 11"  (180.3 cm) Weight: (!) 339 lb 8.1 oz (154 kg) IBW/kg (Calculated) : 75.3 Heparin Dosing Weight: 111.7kg  Vital Signs: Temp: 98.4 F (36.9 C) (01/25 0400) Temp Source: Oral (01/25 0400) BP: 129/76 mmHg (01/25 0600) Pulse Rate: 63 (01/25 0600)  Labs:  Recent Labs  12/23/15 1926 12/24/15 0044 12/24/15 0622 12/25/15 0327 12/25/15 0948 12/25/15 1819 12/26/15 0311 12/26/15 0500  HGB  --   --  14.4  --  14.9  --  13.8  --   HCT  --   --  44.6  --  45.2  --  42.5  --   PLT  --   --  75*  --  68*  --  81*  --   APTT  --   --   --   --   --   --  70*  --   LABPROT 13.5  --   --   --   --   --   --   --   INR 1.01  --   --   --   --   --   --   --   HEPARINUNFRC  --   --   --   --  0.26* 0.38  --  0.54  CREATININE  --   --  1.30* 1.20  --   --  1.31*  --   TROPONINI 0.04* 0.03 <0.03  --   --   --   --   --     Estimated Creatinine Clearance: 109.8 mL/min (by C-G formula based on Cr of 1.31).   Medical History: Past Medical History  Diagnosis Date  . Hypercholesteremia     a. Prev taken off statin due to abnormal liver function.  . Hypertension   . Morbid obesity ([place])   . Abnormal liver enzymes     a. Sees a doctor in [place].  . Thrombocytopenia ([place])   . Hematuria     a. Chronic hx of this, no prior etiology determined through workup per patient.  . Sinus bradycardia     Medications:  Infusions:  . nitroGLYCERIN 10 mcg/min (12/25/15 2000)    Assessment: 45 yo M presented with chest pain now s/p cardiac cath with plans for single vessel CABG today.  Heparin was stopped at 0500 this morning. The HL prior was 0.54 (therapeutic), CBC stable  Plan:  Heparin off for surgery  Follow-up after CABG  Darl Pikes, PharmD Clinical Pharmacist- Resident Pager: [phone]   12/26/2015 6:45 AM

## 2015-12-26 NOTE — Progress Notes (Signed)
TCTS BRIEF SICU PROGRESS NOTE  Day of Surgery  S/P Procedure(s) (LRB): Off Pump Coronary Artery Bypass Grafting times one using left internal mammary artery (N/A) TRANSESOPHAGEAL ECHOCARDIOGRAM (TEE) (N/A)   Sedated on vent NSR w/ stable hemodynamics O2 sats 100% Chest tube output low UOP adequate  Plan: Continue routine early postop  Rexene Alberts, MD 12/26/2015 5:00 PM

## 2015-12-26 NOTE — Op Note (Signed)
CARDIOTHORACIC SURGERY OPERATIVE NOTE  Date of Procedure:  12/26/2015  Preoperative Diagnosis:   Severe Single-vessel Coronary Artery Disease  Unstable Angina  Postoperative Diagnosis: Same  Procedure:    Off-pump Coronary Artery Bypass Grafting x 1   Left Internal Mammary Artery to Distal Left Anterior Descending Coronary Artery  Surgeon: Valentina Gu. Roxy Manns, MD  Assistant: Ellwood Handler, PA-C and Lawernce Keas, PA-S  Anesthesia: Finis Bud, MD  Operative Findings:  Normal left ventricular systolic function  Good quality left internal mammary artery conduit  Good quality target vessel for grafting  Rightward rotation of the heart such that left anterior descending coronary artery immediately behind the sternum  Brief episode supraventricular tachycardia requiring DC cardioversion x2    BRIEF CLINICAL NOTE AND INDICATIONS FOR SURGERY  Patient is a 45 year old morbidly obese white male with no previous cardiac history but risk factors notable for history of hypertension and hyperlipidemia. The patient describes a long-standing history of exertional shortness of breath dating back more than 10 years. Over the past months or so the patient has experienced progressive exertional shortness breath and decreased exercise tolerance. Several days ago he began to experience substernal chest pressure or heaviness that was brought on with physical activity and typically relieved by rest. Symptoms accelerated over the last several days until he developed an episode of chest discomfort and shortness of breath at rest. He presented to the emergency department where EKG revealed sinus bradycardia without ischemic changes. Serial troponin levels have been essentially flat or very slightly elevated, with peak troponin reported 0.04. The patient has continued to experience intermittent chest discomfort and shortness of breath at rest that has been alleviated by nitroglycerin. The patient  underwent diagnostic cardiac catheterization by Dr. Haroldine Laws and was found to have critical high-grade proximal stenosis of the left anterior descending coronary artery. Left ventricular systolic function is preserved. Cardiothoracic surgical consultation was requested.  The patient has been seen in consultation and counseled at length regarding the indications, risks and potential benefits of surgery.  All questions have been answered, and the patient provides full informed consent for the operation as described.    DETAILS OF THE OPERATIVE PROCEDURE  Preparation:  The patient is brought to the operating room on the above mentioned date and central monitoring was established by the anesthesia team including placement of Swan-Ganz catheter and radial arterial line. The patient had mild to moderate pulmonary hypertension at baseline.  The patient is placed in the supine position on the operating table.  Intravenous antibiotics are administered. General endotracheal anesthesia is induced uneventfully. A Foley catheter is placed.  Baseline transesophageal echocardiogram was performed.  Findings were notable for normal LV function.  There was no evidence of patent foramen ovale.  No other significant abnormalities were noted.  The patient's chest, abdomen, both groins, and both lower extremities are prepared and draped in a sterile manner. A time out procedure is performed.   Surgical Approach and Harvest of Conduit:  A median sternotomy incision was performed and the left internal mammary artery is dissected from the chest wall and prepared for bypass grafting. The left internal mammary artery is notably good quality conduit.  Following systemic heparinization, the left internal mammary artery was transected distally noted to have excellent flow.  The pericardium is opened. The heart is notably rotated towards the right within the chest such that the left anterior descending coronary artery lies  immediately behind the sternum.  The made off-pump bypass grafting straightforward but would be relevant in  the setting of redo median sternotomy in the future.  The [employer] off-pump cardiac stabilization system is utilized to facilitate off-pump coronary revascularization.  The U-shaped stabilization arm are utilized.  The apical suction cup is not required.  Elastic vessel loops are used for proximal and distal hemostatic control.  Intracoronary shunts are not utilized.   Off-pump Coronary Artery Bypass Grafting:   The distal left anterior coronary artery was grafted with the left internal mammary artery in an end-to-side fashion.  At the site of distal anastomosis the target vessel was good quality and measured approximately 2.0 mm in diameter.    Procedure Completion:  Followup transesophageal echocardiogram performed after completion of all grafts revealed no changes from the preoperative exam.  Protamine was administered to reverse the anticoagulation.   The mediastinum and pleural space were inspected for hemostasis and irrigated with saline solution. The mediastinum and the left pleural space were drained using 3 chest tubes placed through separate stab incisions inferiorly.  The soft tissues anterior to the aorta were reapproximated loosely. The sternum is closed with double strength sternal wire. The soft tissues anterior to the sternum were closed in multiple layers and the skin is closed with a running subcuticular skin closure.  No blood products were administered during the operation.  The patient developed supraventricular tachycardia on 2 occasions during the procedure requiring synchronized DC cardioversion.  Prior to cardioversion TEE confirmed the absence of thrombus within the left atrial appendage.   Patient Disposition:  The patient tolerated the procedure well and is transported to the surgical intensive care in stable condition. There are no intraoperative  complications. All sponge instrument and needle counts are verified correct at completion of the operation.    Valentina Gu. Roxy Manns MD 12/26/2015 12:01 PM

## 2015-12-26 NOTE — Progress Notes (Signed)
RT attempted to wean pt per SICU wean protocol times 2.  MD called with parameters and ABG results.  Pt placed back on full vent support per MD and ABG to be drawn in 1 hour.

## 2015-12-26 NOTE — Progress Notes (Signed)
RT NOTE:  Cardiac wean terminated. Pt did well with RR 4/40% and CPAP/PS.   ABG not within acceptable range. Results for Sean [name], Sean [name] (MRN [id number]) as of 12/26/2015 21:47 Sample type ARTERIAL  pH, Arterial 7.262 (L)  pCO2 arterial 50.8 (H)  pO2, Arterial 100.0  Bicarbonate 22.8  TCO2 24  Acid-base deficit 4.0 (H)  O2 Saturation 96.0  Patient temperature 99.5 F   NIF/VC done. Pt effort was poor. Pt does not seem to understand technique even with multiple attempts. NIF -12, VC 450-500. RN at bedside during parameters. When patient was first removed from Vent for parameters, heart rate dropped to 46 and immediately recovered to 76. Sats dropped to 86% and immediately recovered to 99%.  RN is now calling Dr. Roxy Manns to update and request next step.

## 2015-12-26 NOTE — Brief Op Note (Addendum)
12/23/2015 - 12/26/2015  11:07 AM  PATIENT:  Sean [name]  45 y.o. male  PRE-OPERATIVE DIAGNOSIS:  CAD  POST-OPERATIVE DIAGNOSIS:  CAD  PROCEDURE:  Procedure(s):  OFF PUMP CORONARY ARTERY BYPASS GRAFTING x 1 -LIMA to LAD  TRANSESOPHAGEAL ECHOCARDIOGRAM (TEE) (N/A)  SURGEON:  Surgeon(s) and Role:    * Rexene Alberts, MD - Primary  PHYSICIAN ASSISTANT: [name] Barrett PA-C, Lawernce Keas PA-Student  ANESTHESIA:   general  EBL:     BLOOD ADMINISTERED: CELLSAVER  DRAINS: Left Pleural Chest Tube, Mediastinal Chest Drains   LOCAL MEDICATIONS USED:  NONE  SPECIMEN:  No Specimen  DISPOSITION OF SPECIMEN:  N/A  COUNTS:  YES  TOURNIQUET:  * No tourniquets in log *  DICTATION: .Dragon Dictation  PLAN OF CARE: Admit to inpatient   PATIENT DISPOSITION:  ICU - intubated and hemodynamically stable.   Delay start of Pharmacological VTE agent (>24hrs) due to surgical blood loss or risk of bleeding: yes  Rexene Alberts, MD 12/26/2015 11:58 AM

## 2015-12-26 NOTE — Progress Notes (Signed)
RT NOTE:  Cardiac Rapid Wean started. Dr. Roxy Manns request ABG to be drawn prior to wean to make sure acidosis has been corrected.  ABG: Results for Sean [name], Sean [name] (MRN [id number]) as of 12/26/2015 20:35 Sample type ARTERIAL  pH, Arterial 7.307 (L)  pCO2 arterial 47.5 (H)  pO2, Arterial 130.0 (H)  Bicarbonate 23.6  TCO2 25  Acid-base deficit 3.0 (H)  O2 Saturation 99.0  Patient temperature 99.5 F   Pt is very anxious, motioning that he wants the ETT out. RT explained the process of the Rapid Wean and PT understands.

## 2015-12-26 NOTE — Anesthesia Preprocedure Evaluation (Addendum)
Anesthesia Evaluation  Patient identified by MRN, date of birth, ID band Patient awake    Reviewed: Allergy & Precautions, NPO status , Patient's Chart, lab work & pertinent test results  Airway Mallampati: II  TM Distance: >3 FB Neck ROM: Full    Dental   Pulmonary sleep apnea ,    breath sounds clear to auscultation       Cardiovascular hypertension, + angina + CAD   Rhythm:Regular Rate:Normal     Neuro/Psych    GI/Hepatic negative GI ROS, Neg liver ROS,   Endo/Other    Renal/GU Renal disease     Musculoskeletal   Abdominal   Peds  Hematology   Anesthesia Other Findings   Reproductive/Obstetrics                            Anesthesia Physical Anesthesia Plan  ASA: III  Anesthesia Plan: General   Post-op Pain Management:    Induction: Intravenous  Airway Management Planned: Oral ETT  Additional Equipment: Arterial line, PA Cath and TEE  Intra-op Plan:   Post-operative Plan: Post-operative intubation/ventilation  Informed Consent: I have reviewed the patients History and Physical, chart, labs and discussed the procedure including the risks, benefits and alternatives for the proposed anesthesia with the patient or authorized representative who has indicated his/her understanding and acceptance.   Dental advisory given  Plan Discussed with: CRNA and Anesthesiologist  Anesthesia Plan Comments:         Anesthesia Quick Evaluation

## 2015-12-26 NOTE — Progress Notes (Signed)
*  PRELIMINARY RESULTS* Echocardiogram Echocardiogram Transesophageal has been performed.  Sean [name] 12/26/2015, 9:44 AM

## 2015-12-26 NOTE — Progress Notes (Addendum)
12/26/2015 1440  Noted on post op cxr swan ganz malposition. Dr. Roxy Manns paged and made aware. Verbal telephone order given ok to d/c swan. Orders enacted. Will continue to closely monitor patient.  Sean [name], Arville Lime

## 2015-12-26 NOTE — Transfer of Care (Signed)
Immediate Anesthesia Transfer of Care Note  Patient: Sean [name]  Procedure(s) Performed: Procedure(s): Off Pump Coronary Artery Bypass Grafting times one using left internal mammary artery (N/A) TRANSESOPHAGEAL ECHOCARDIOGRAM (TEE) (N/A)  Patient Location: SICU  Anesthesia Type:General  Level of Consciousness: Patient remains intubated per anesthesia plan  Airway & Oxygen Therapy: Patient remains intubated per anesthesia plan and Patient placed on Ventilator (see vital sign flow sheet for setting)  Post-op Assessment: Report given to RN, Post -op Vital signs reviewed and stable and Patient moving all extremities  Post vital signs: Reviewed and stable  Last Vitals:  Filed Vitals:   12/26/15 0600 12/26/15 1230  BP: 129/76 119/65  Pulse: 63 66  Temp:    Resp: 14 12    Complications: No apparent anesthesia complications

## 2015-12-26 NOTE — Anesthesia Postprocedure Evaluation (Signed)
Anesthesia Post Note  Patient: Sean [name]  Procedure(s) Performed: Procedure(s) (LRB): Off Pump Coronary Artery Bypass Grafting times one using left internal mammary artery (N/A) TRANSESOPHAGEAL ECHOCARDIOGRAM (TEE) (N/A)  Patient location during evaluation: SICU Anesthesia Type: General Level of consciousness: patient remains intubated per anesthesia plan Pain management: pain level controlled Vital Signs Assessment: post-procedure vital signs reviewed and stable Respiratory status: patient remains intubated per anesthesia plan Cardiovascular status: stable Anesthetic complications: no    Last Vitals:  Filed Vitals:   12/26/15 1515 12/26/15 1537  BP: 97/61   Pulse: 50 52  Temp:    Resp: 16 11    Last Pain:  Filed Vitals:   12/26/15 1538  PainSc: Asleep                 EDWARDS,Nadiah Corbit

## 2015-12-26 NOTE — Progress Notes (Addendum)
12/26/2015 4:30 PM After 20 minutes CPAP/PS, pt. abg did not meet protocol to extubate. Placed back on ventilator support per Rapid Wean Protocol. Will continue to closely monitor and progress patient as indicated.  Judythe Postema, Arville Lime

## 2015-12-26 NOTE — Progress Notes (Signed)
Pt's third attempt to wean was unsuccessful. Dr. Roxy Manns called and made aware of the ABG and parameter results. Orders received to let the patient rest for the night and wean/extubate at 0700. Will continue to monitor.

## 2015-12-26 NOTE — Anesthesia Procedure Notes (Addendum)
Central Venous Catheter Insertion Performed by: anesthesiologist 12/26/2015 7:30 AM Patient location: OOR procedure area. Preanesthetic checklist: patient identified, IV checked, site marked, risks and benefits discussed, surgical consent, monitors and equipment checked, pre-op evaluation, timeout performed and anesthesia consent Position: Trendelenburg Lidocaine 1% used for infiltration Landmarks identified and Seldinger technique used Catheter size: 8.5 Fr PA cath was placed.MAC introducer Swan type and PA catheter depth:thermodilation and 45PA Cath depth:45 Procedure performed using ultrasound guided technique. Attempts: 2 Following insertion, line sutured and dressing applied. Post procedure assessment: blood return through all ports, free fluid flow and no air. Patient tolerated the procedure well with no immediate complications.  Procedure Name: Intubation Date/Time: 12/26/2015 8:49 AM Performed by: Izora Gala Pre-anesthesia Checklist: Patient identified, Emergency Drugs available, Suction available and Patient being monitored Patient Re-evaluated:Patient Re-evaluated prior to inductionOxygen Delivery Method: Circle system utilized Preoxygenation: Pre-oxygenation with 100% oxygen Intubation Type: IV induction Ventilation: Mask ventilation without difficulty Laryngoscope Size: Glidescope Grade View: Grade II Tube type: Oral Tube size: 8.0 mm Number of attempts: 1 Airway Equipment and Method: Stylet and Video-laryngoscopy Placement Confirmation: ETT inserted through vocal cords under direct vision,  positive ETCO2 and breath sounds checked- equal and bilateral Secured at: 23 cm Tube secured with: Tape Dental Injury: Teeth and Oropharynx as per pre-operative assessment

## 2015-12-26 NOTE — Progress Notes (Signed)
12/26/2015 6:37 PM Nursing note Dr. Roxy Manns paged and made aware of pt. Not meeting parameters again for second attempt to wean ventilator. Verbal orders received to place back on full ventilator support at a rate of 16 with Tidal volume of 550 for one hour. In one hour verbal order received to repeat ABG on ventilator prior and ensure WNL prior to initiating third attempt to wean. Orders enacted. Oncomming RN and RT updated on plan of care. Will continue to closely monitor patient.  Sean [name], Sean [name]

## 2015-12-27 ENCOUNTER — Encounter (HOSPITAL_COMMUNITY): Payer: Self-pay | Admitting: Thoracic Surgery (Cardiothoracic Vascular Surgery)

## 2015-12-27 ENCOUNTER — Inpatient Hospital Stay (HOSPITAL_COMMUNITY): Payer: BLUE CROSS/BLUE SHIELD

## 2015-12-27 DIAGNOSIS — Z951 Presence of aortocoronary bypass graft: Secondary | ICD-10-CM

## 2015-12-27 LAB — GLUCOSE, CAPILLARY
GLUCOSE-CAPILLARY: 103 mg/dL — AB (ref 65–99)
GLUCOSE-CAPILLARY: 103 mg/dL — AB (ref 65–99)
GLUCOSE-CAPILLARY: 103 mg/dL — AB (ref 65–99)
GLUCOSE-CAPILLARY: 104 mg/dL — AB (ref 65–99)
GLUCOSE-CAPILLARY: 111 mg/dL — AB (ref 65–99)
GLUCOSE-CAPILLARY: 111 mg/dL — AB (ref 65–99)
GLUCOSE-CAPILLARY: 113 mg/dL — AB (ref 65–99)
GLUCOSE-CAPILLARY: 124 mg/dL — AB (ref 65–99)
GLUCOSE-CAPILLARY: 125 mg/dL — AB (ref 65–99)
GLUCOSE-CAPILLARY: 127 mg/dL — AB (ref 65–99)
GLUCOSE-CAPILLARY: 127 mg/dL — AB (ref 65–99)
GLUCOSE-CAPILLARY: 137 mg/dL — AB (ref 65–99)
GLUCOSE-CAPILLARY: 217 mg/dL — AB (ref 65–99)
Glucose-Capillary: 112 mg/dL — ABNORMAL HIGH (ref 65–99)
Glucose-Capillary: 156 mg/dL — ABNORMAL HIGH (ref 65–99)
Glucose-Capillary: 147 mg/dL — ABNORMAL HIGH (ref 65–99)
Glucose-Capillary: 125 mg/dL — ABNORMAL HIGH (ref 65–99)
Glucose-Capillary: 121 mg/dL — ABNORMAL HIGH (ref 65–99)
Glucose-Capillary: 106 mg/dL — ABNORMAL HIGH (ref 65–99)
Glucose-Capillary: 121 mg/dL — ABNORMAL HIGH (ref 65–99)

## 2015-12-27 LAB — BLOOD GAS, ARTERIAL
ACID-BASE DEFICIT: 2.2 mmol/L — AB (ref 0.0–2.0)
Bicarbonate: 22.6 mEq/L (ref 20.0–24.0)
FIO2: 0.5
LHR: 16 {breaths}/min
O2 SAT: 98.7 %
PATIENT TEMPERATURE: 98.9
PCO2 ART: 43 mmHg (ref 35.0–45.0)
PEEP/CPAP: 5 cmH2O
PO2 ART: 129 mmHg — AB (ref 80.0–100.0)
TCO2: 23.9 mmol/L (ref 0–100)
VT: 550 mL
pH, Arterial: 7.342 — ABNORMAL LOW (ref 7.350–7.450)

## 2015-12-27 LAB — CBC
HCT: 37.2 % — ABNORMAL LOW (ref 39.0–52.0)
HEMATOCRIT: 39.1 % (ref 39.0–52.0)
HEMOGLOBIN: 12.5 g/dL — AB (ref 13.0–17.0)
Hemoglobin: 12.2 g/dL — ABNORMAL LOW (ref 13.0–17.0)
MCH: 31.6 pg (ref 26.0–34.0)
MCH: 31.3 pg (ref 26.0–34.0)
MCHC: 32.8 g/dL (ref 30.0–36.0)
MCHC: 32 g/dL (ref 30.0–36.0)
MCV: 96.4 fL (ref 78.0–100.0)
MCV: 97.8 fL (ref 78.0–100.0)
Platelets: 59 10*3/uL — ABNORMAL LOW (ref 150–400)
Platelets: 79 10*3/uL — ABNORMAL LOW (ref 150–400)
RBC: 3.86 MIL/uL — ABNORMAL LOW (ref 4.22–5.81)
RBC: 4 MIL/uL — ABNORMAL LOW (ref 4.22–5.81)
RDW: 13.4 % (ref 11.5–15.5)
RDW: 13.7 % (ref 11.5–15.5)
WBC: 15.3 10*3/uL — ABNORMAL HIGH (ref 4.0–10.5)
WBC: 18.6 10*3/uL — ABNORMAL HIGH (ref 4.0–10.5)

## 2015-12-27 LAB — POCT I-STAT 3, ART BLOOD GAS (G3+)
ACID-BASE DEFICIT: 3 mmol/L — AB (ref 0.0–2.0)
ACID-BASE DEFICIT: 3 mmol/L — AB (ref 0.0–2.0)
ACID-BASE EXCESS: 3 mmol/L — AB (ref 0.0–2.0)
BICARBONATE: 22.9 meq/L (ref 20.0–24.0)
Bicarbonate: 23.8 mEq/L (ref 20.0–24.0)
Bicarbonate: 28.3 mEq/L — ABNORMAL HIGH (ref 20.0–24.0)
O2 SAT: 95 %
O2 SAT: 95 %
O2 Saturation: 96 %
TCO2: 24 mmol/L (ref 0–100)
TCO2: 25 mmol/L (ref 0–100)
TCO2: 30 mmol/L (ref 0–100)
pCO2 arterial: 44.8 mmHg (ref 35.0–45.0)
pCO2 arterial: 51.2 mmHg — ABNORMAL HIGH (ref 35.0–45.0)
pCO2 arterial: 47.9 mmHg — ABNORMAL HIGH (ref 35.0–45.0)
pH, Arterial: 7.319 — ABNORMAL LOW (ref 7.350–7.450)
pH, Arterial: 7.275 — ABNORMAL LOW (ref 7.350–7.450)
pH, Arterial: 7.38 (ref 7.350–7.450)
pO2, Arterial: 91 mmHg (ref 80.0–100.0)
pO2, Arterial: 86 mmHg (ref 80.0–100.0)
pO2, Arterial: 79 mmHg — ABNORMAL LOW (ref 80.0–100.0)

## 2015-12-27 LAB — POCT I-STAT 3, VENOUS BLOOD GAS (G3P V)
ACID-BASE DEFICIT: 3 mmol/L — AB (ref 0.0–2.0)
Bicarbonate: 24 mEq/L (ref 20.0–24.0)
O2 Saturation: 65 %
PH VEN: 7.275 (ref 7.250–7.300)
TCO2: 26 mmol/L (ref 0–100)
pCO2, Ven: 51.8 mmHg — ABNORMAL HIGH (ref 45.0–50.0)
pO2, Ven: 39 mmHg (ref 30.0–45.0)

## 2015-12-27 LAB — POCT I-STAT, CHEM 8
BUN: 21 mg/dL — ABNORMAL HIGH (ref 6–20)
CREATININE: 1.4 mg/dL — AB (ref 0.61–1.24)
Calcium, Ion: 1.24 mmol/L — ABNORMAL HIGH (ref 1.12–1.23)
Chloride: 103 mmol/L (ref 101–111)
GLUCOSE: 137 mg/dL — AB (ref 65–99)
HEMATOCRIT: 39 % (ref 39.0–52.0)
HEMOGLOBIN: 13.3 g/dL (ref 13.0–17.0)
POTASSIUM: 4.4 mmol/L (ref 3.5–5.1)
Sodium: 141 mmol/L (ref 135–145)
TCO2: 27 mmol/L (ref 0–100)

## 2015-12-27 LAB — BASIC METABOLIC PANEL
Anion gap: 8 (ref 5–15)
BUN: 15 mg/dL (ref 6–20)
CALCIUM: 8.4 mg/dL — AB (ref 8.9–10.3)
CO2: 25 mmol/L (ref 22–32)
CREATININE: 1.38 mg/dL — AB (ref 0.61–1.24)
Chloride: 107 mmol/L (ref 101–111)
GFR calc non Af Amer: >60 mL/min (ref >60)
Glucose, Bld: 126 mg/dL — ABNORMAL HIGH (ref 65–99)
Potassium: 4.9 mmol/L (ref 3.5–5.1)
SODIUM: 140 mmol/L (ref 135–145)

## 2015-12-27 LAB — MAGNESIUM
MAGNESIUM: 2.2 mg/dL (ref 1.7–2.4)
MAGNESIUM: 2.1 mg/dL (ref 1.7–2.4)

## 2015-12-27 LAB — CREATININE, SERUM
Creatinine, Ser: 1.56 mg/dL — ABNORMAL HIGH (ref 0.61–1.24)
GFR calc Af Amer: >60 mL/min (ref >60)
GFR, EST NON AFRICAN AMERICAN: 53 mL/min — AB (ref >60)

## 2015-12-27 MED ORDER — INSULIN ASPART 100 UNIT/ML ~~LOC~~ SOLN
0.0000 [IU] | SUBCUTANEOUS | Status: DC
  Administered 2015-12-27: 2 [IU] via SUBCUTANEOUS
  Administered 2015-12-27: 8 [IU] via SUBCUTANEOUS
  Administered 2015-12-27 – 2015-12-28 (×2): 2 [IU] via SUBCUTANEOUS

## 2015-12-27 MED ORDER — INSULIN ASPART 100 UNIT/ML ~~LOC~~ SOLN: 0.0000 [IU] | SUBCUTANEOUS | Status: DC

## 2015-12-27 MED ORDER — INSULIN DETEMIR 100 UNIT/ML ~~LOC~~ SOLN
30.0000 [IU] | Freq: Once | SUBCUTANEOUS | Status: AC
  Administered 2015-12-27: 30 [IU] via SUBCUTANEOUS
  Filled 2015-12-27 (×2): qty 0.3

## 2015-12-27 MED ORDER — MORPHINE SULFATE (PF) 2 MG/ML IV SOLN
2.0000 mg | INTRAVENOUS | Status: DC | PRN
  Administered 2015-12-27 (×2): 2 mg via INTRAVENOUS
  Filled 2015-12-27 (×2): qty 1

## 2015-12-27 MED ORDER — FUROSEMIDE 10 MG/ML IJ SOLN
40.0000 mg | Freq: Once | INTRAMUSCULAR | Status: AC
  Administered 2015-12-27: 40 mg via INTRAVENOUS
  Filled 2015-12-27: qty 4

## 2015-12-27 MED ORDER — METOPROLOL TARTRATE 25 MG PO TABS
25.0000 mg | ORAL_TABLET | Freq: Two times a day (BID) | ORAL | Status: DC
  Administered 2015-12-27: 25 mg via ORAL
  Filled 2015-12-27: qty 1

## 2015-12-27 NOTE — Progress Notes (Signed)
ABG obtained 1 hour post extubation and results called to Dr Roxy Manns by RN. Telephone order to place pt on bipap at this time, PRN. RT will continue to monitor.

## 2015-12-27 NOTE — Progress Notes (Signed)
[address]       [place] [address]             [phone]        CARDIOTHORACIC SURGERY PROGRESS NOTE   R1 Day Post-Op Procedure(s) (LRB): Off Pump Coronary Artery Bypass Grafting times one using left internal mammary artery (N/A) TRANSESOPHAGEAL ECHOCARDIOGRAM (TEE) (N/A)  Subjective: Mr Schoening with mild to moderate pain and discomfort following extubation this morning.  Objective: Vital signs: BP Readings from Last 1 Encounters:  12/27/15 99/63   Pulse Readings from Last 1 Encounters:  12/27/15 62   Resp Readings from Last 1 Encounters:  12/27/15 16   Temp Readings from Last 1 Encounters:  12/27/15 98.9 F (37.2 C) Oral    Hemodynamics: PAP: (39-48)/(23-30) 41/24 mmHg CO:  [4.9 L/min-5.6 L/min] 4.9 L/min CI:  [1.9 L/min/m2-2.1 L/min/m2] 1.9 L/min/m2  Physical Exam:  Rhythm:   Regular rate and rhythm  Breath sounds: Clear to bilateral ascultation. Shallow.  Heart sounds:  S1 and S2 present. No murmurs , rubs or gallops.  Incisions:  Clean and dry.  Abdomen:  Soft, non-tender, non distended.  Extremities:  Mild edema in LE   Intake/Output from previous day: 01/25 0701 - 01/26 0700 In: 4547.2 [I.V.:3947.2; IV Piggyback:600] Out: 1430 [Urine:755; Emesis/NG output:55; Blood:150; Chest Tube:470] Intake/Output this shift:    Lab Results:  CBC: Recent Labs  12/26/15 1830 12/27/15 0410  WBC 12.8* 15.3*  HGB 12.7*  13.9 12.2*  HCT 39.4  41.0 37.2*  PLT 59* 59*    BMET:  Recent Labs  12/26/15 0311  12/26/15 1830 12/27/15 0410  NA 141  < > 142 140  K 4.0  < > 4.5 4.9  CL 102  < > 104 107  CO2 23  --   --  25  GLUCOSE 142*  < > 137* 126*  BUN 11  < > 13 15  CREATININE 1.31*  < > 1.25*  1.10 1.38*  CALCIUM 8.5*  --   --  8.4*  < > = values in this interval not displayed.   PT/INR:   Recent Labs  12/26/15 1259  LABPROT 15.1  INR 1.17    CBG (last 3)   Recent Labs  12/27/15 0527 12/27/15 0629 12/27/15 0736   GLUCAP 111* 113* 111*    ABG    Component Value Date/Time   PHART 7.319* 12/27/2015 0701   PCO2ART 44.8 12/27/2015 0701   PO2ART 91.0 12/27/2015 0701   HCO3 22.9 12/27/2015 0701   TCO2 24 12/27/2015 0701   ACIDBASEDEF 3.0* 12/27/2015 0701   O2SAT 96.0 12/27/2015 0701    CXR: Personally reviewed by me. Improved lung volumes compared to yesterday, no PE, no pneumothorax. IMPRESSION: 1. Improved appearance of the lungs with him improved inflation and decreased interstitial edema. There is no pleural effusion or pneumothorax. The cardiac silhouette remains enlarged but is more distinct. 2. The nasogastric tube tip projects at the level of the thoracic inlet. Removal or advancement is recommended. The other remaining support tubes are in reasonable position. 3. Critical Value/emergent results were called by telephone at the time of interpretation on 12/27/2015 at 7:40 am to Henrietta Dine, RN, who verbally acknowledged these results.   Electronically Signed  By: David Martinique M.D.  On: 12/27/2015 07:42 Assessment/Plan: S/P Procedure(s) (LRB): Off Pump Coronary Artery Bypass Grafting times one using left internal mammary artery (N/A) TRANSESOPHAGEAL ECHOCARDIOGRAM (TEE) (N/A)  Doing well POD1 Maintaining NSR with stable BP  Extubated this morning, breathing comfortably, O2 sats 96% on RA Expected post op acute blood loss anemia, mild Expected post op atelectasis, moderate  Ambulate D/C tubes and lines Gentle diuresis ICU for now   Lawernce Keas, PA-S 12/27/2015 8:24 AM   I have seen and examined the patient and agree with the assessment and plan as outlined.  Rexene Alberts, MD 12/27/2015

## 2015-12-27 NOTE — Progress Notes (Signed)
VC 0.500, NIF -18, Pt placed back on cpap 5/5 to wait for MD arrival and assessment. ABG within normal limits. RT will continue to monitor.

## 2015-12-27 NOTE — Progress Notes (Signed)
No immediate need for Bipap at this time.  Patient resting comfortably on 3L [place].  SpO2 98%  RR: 16.  RT will continue to follow.

## 2015-12-27 NOTE — Procedures (Signed)
Extubation Procedure Note  Patient Details:   Name: Sean [name] DOB: 1970-12-13 MRN: [id number]   Airway Documentation:     Evaluation  O2 sats: stable throughout Complications: No apparent complications Patient did tolerate procedure well. Bilateral Breath Sounds: Clear, Diminished Suctioning: Airway Yes   Pt extubated per Open Heart Rapid Wean Protocol and verbal order Dr Roxy Manns. Pt stable throughout with no complications. Pt able to speak name and has a weak non-productive cough, complaining of pain. Pt placed on 3L [place]. RT will continue to monitor.  Jesse Sans 12/27/2015, 7:43 AM

## 2015-12-27 NOTE — Progress Notes (Signed)
RT NOTE:  Cardiac Rapid Wean started. Dr. Roxy Manns reccommended hold off on attempted wean throughout night and having patient ready to extubate at 0700 when he arrives.

## 2015-12-28 ENCOUNTER — Inpatient Hospital Stay (HOSPITAL_COMMUNITY): Payer: BLUE CROSS/BLUE SHIELD

## 2015-12-28 LAB — CBC
HEMATOCRIT: 38.4 % — AB (ref 39.0–52.0)
Hemoglobin: 12.6 g/dL — ABNORMAL LOW (ref 13.0–17.0)
MCH: 32.1 pg (ref 26.0–34.0)
MCHC: 32.8 g/dL (ref 30.0–36.0)
MCV: 97.7 fL (ref 78.0–100.0)
PLATELETS: 82 10*3/uL — AB (ref 150–400)
RBC: 3.93 MIL/uL — AB (ref 4.22–5.81)
RDW: 13.9 % (ref 11.5–15.5)
WBC: 17.4 10*3/uL — ABNORMAL HIGH (ref 4.0–10.5)

## 2015-12-28 LAB — BASIC METABOLIC PANEL
Anion gap: 4 — ABNORMAL LOW (ref 5–15)
BUN: 19 mg/dL (ref 6–20)
CHLORIDE: 106 mmol/L (ref 101–111)
CO2: 27 mmol/L (ref 22–32)
CREATININE: 1.54 mg/dL — AB (ref 0.61–1.24)
Calcium: 8.4 mg/dL — ABNORMAL LOW (ref 8.9–10.3)
GFR calc non Af Amer: 54 mL/min — ABNORMAL LOW (ref >60)
Glucose, Bld: 159 mg/dL — ABNORMAL HIGH (ref 65–99)
POTASSIUM: 4.4 mmol/L (ref 3.5–5.1)
Sodium: 137 mmol/L (ref 135–145)

## 2015-12-28 LAB — GLUCOSE, CAPILLARY
GLUCOSE-CAPILLARY: 106 mg/dL — AB (ref 65–99)
GLUCOSE-CAPILLARY: 114 mg/dL — AB (ref 65–99)
Glucose-Capillary: 156 mg/dL — ABNORMAL HIGH (ref 65–99)
Glucose-Capillary: 187 mg/dL — ABNORMAL HIGH (ref 65–99)
Glucose-Capillary: 123 mg/dL — ABNORMAL HIGH (ref 65–99)

## 2015-12-28 MED ORDER — ENOXAPARIN SODIUM 40 MG/0.4ML ~~LOC~~ SOLN
40.0000 mg | SUBCUTANEOUS | Status: DC
  Administered 2015-12-28 – 2015-12-31 (×4): 40 mg via SUBCUTANEOUS
  Filled 2015-12-28 (×4): qty 0.4

## 2015-12-28 MED ORDER — POTASSIUM CHLORIDE CRYS ER 20 MEQ PO TBCR
20.0000 meq | EXTENDED_RELEASE_TABLET | Freq: Every day | ORAL | Status: DC
Start: 2015-12-28 — End: 2015-12-29
  Administered 2015-12-28 – 2015-12-29 (×2): 20 meq via ORAL
  Filled 2015-12-28 (×2): qty 1

## 2015-12-28 MED ORDER — SODIUM CHLORIDE 0.9% FLUSH
3.0000 mL | INTRAVENOUS | Status: DC | PRN
  Administered 2015-12-28 (×2): 3 mL via INTRAVENOUS
  Filled 2015-12-28 (×2): qty 3

## 2015-12-28 MED ORDER — ATENOLOL 25 MG PO TABS
25.0000 mg | ORAL_TABLET | Freq: Two times a day (BID) | ORAL | Status: DC
  Administered 2015-12-28 – 2015-12-29 (×4): 25 mg via ORAL
  Filled 2015-12-28 (×6): qty 1

## 2015-12-28 MED ORDER — MOVING RIGHT ALONG BOOK
Freq: Once | Status: AC
  Administered 2015-12-28: 08:00:00
  Filled 2015-12-28: qty 1

## 2015-12-28 MED ORDER — PANTOPRAZOLE SODIUM 40 MG PO TBEC
40.0000 mg | DELAYED_RELEASE_TABLET | Freq: Every day | ORAL | Status: DC
  Administered 2015-12-28 – 2015-12-30 (×4): 40 mg via ORAL
  Filled 2015-12-28 (×4): qty 1

## 2015-12-28 MED ORDER — SODIUM CHLORIDE 0.9% FLUSH
3.0000 mL | Freq: Two times a day (BID) | INTRAVENOUS | Status: DC
  Administered 2015-12-28 – 2015-12-30 (×5): 3 mL via INTRAVENOUS

## 2015-12-28 MED ORDER — INSULIN ASPART 100 UNIT/ML ~~LOC~~ SOLN
0.0000 [IU] | Freq: Three times a day (TID) | SUBCUTANEOUS | Status: DC
  Administered 2015-12-28: 4 [IU] via SUBCUTANEOUS
  Administered 2015-12-28 – 2015-12-29 (×2): 2 [IU] via SUBCUTANEOUS
  Administered 2015-12-29 – 2015-12-30 (×2): 3 [IU] via SUBCUTANEOUS
  Administered 2015-12-30: 2 [IU] via SUBCUTANEOUS

## 2015-12-28 MED ORDER — FUROSEMIDE 40 MG PO TABS
40.0000 mg | ORAL_TABLET | Freq: Every day | ORAL | Status: DC
  Administered 2015-12-28 – 2015-12-29 (×2): 40 mg via ORAL
  Filled 2015-12-28 (×2): qty 1

## 2015-12-28 MED ORDER — SODIUM CHLORIDE 0.9 % IV SOLN: 250.0000 mL | INTRAVENOUS | Status: DC | PRN

## 2015-12-28 NOTE — Progress Notes (Signed)
Sean [name]       ,Sean [name]             [id number]        CARDIOTHORACIC SURGERY PROGRESS NOTE   R2 Days Post-Op Procedure(s) (LRB): Off Pump Coronary Artery Bypass Grafting times one using left internal mammary artery (N/A) TRANSESOPHAGEAL ECHOCARDIOGRAM (TEE) (N/A)  Subjective: Patient feels great, "better than any time in the past 6 months", minimal pain. Bowel movement this morning. He is eager to go home and start his regular activities, we discusses physical limitations given that he just has a sternotomy. He has no complaints at this time.  Objective: Vital signs: BP Readings from Last 1 Encounters:  12/28/15 98/67   Pulse Readings from Last 1 Encounters:  12/28/15 79   Resp Readings from Last 1 Encounters:  12/28/15 26   Temp Readings from Last 1 Encounters:  12/28/15 98.1 F (36.7 C) Oral    Hemodynamics:    Physical Exam:  Rhythm:   Regular rate and rhythm  Breath sounds: Clear to ausculation bilaterally  Heart sounds:  S1 and S2 present, no murmers , rubs or gallops  Incisions:  Clean and dry  Abdomen:  Soft, non-tender, non-distended, no organomegaly  Extremities:  Mild edema in the LE   Intake/Output from previous day: 01/26 0701 - 01/27 0700 In: 592.3 [P.O.:480; I.V.:12.3; IV Piggyback:100] Out: 2465 [Urine:2245; Chest Tube:220] Intake/Output this shift:    Lab Results:  CBC: Recent Labs  12/27/15 1640 12/27/15 1647 12/28/15 0430  WBC 18.6*  --  17.4*  HGB 12.5* 13.3 12.6*  HCT 39.1 39.0 38.4*  PLT 79*  --  82*    BMET:  Recent Labs  12/27/15 0410  12/27/15 1647 12/28/15 0430  NA 140  --  141 137  K 4.9  --  4.4 4.4  CL 107  --  103 106  CO2 25  --   --  27  GLUCOSE 126*  --  137* 159*  BUN 15  --  21* 19  CREATININE 1.38*  < > 1.40* 1.54*  CALCIUM 8.4*  --   --  8.4*  < > = values in this interval not displayed.   PT/INR:   Recent Labs  12/26/15 1259  LABPROT 15.1  INR 1.17    CBG  (last 3)   Recent Labs  12/27/15 1627 12/27/15 2026 12/27/15 2302  GLUCAP 124* 121* 217*    ABG    Component Value Date/Time   PHART 7.380 12/27/2015 1139   PCO2ART 47.9* 12/27/2015 1139   PO2ART 79.0* 12/27/2015 1139   HCO3 24.0 12/27/2015 1629   TCO2 27 12/27/2015 1647   ACIDBASEDEF 3.0* 12/27/2015 1629   O2SAT 65.0 12/27/2015 1629    CXR: Was personally reviewed by me. Stable, no acute changes. FINDINGS: Sequelae of recent CABG are again identified. Endotracheal, enteric, mediastinal, and chest tubes have been removed. Right jugular sheath remains in place. The cardiac silhouette remains enlarged. The left hemidiaphragm remains elevated with gaseous distention of underlying splenic flexure noted. Subsegmental atelectasis in the left lower lung is unchanged. No sizable pleural effusion, pneumothorax, or overt edema is identified.  IMPRESSION: 1. Interval removal of multiple support devices as above. 2. Unchanged left basilar atelectasis.   Electronically Signed  By: Logan Bores M.D.  On: 12/28/2015 07:52   Assessment/Plan: S/P Procedure(s) (LRB): Off Pump Coronary Artery Bypass Grafting times one using left internal mammary artery (N/A) TRANSESOPHAGEAL ECHOCARDIOGRAM (TEE) (N/A)  Doing well POD2 Maintaining NSR with stable BP Breathing comfortably, O2 sats 96% on 3L/min by , sats stable without Expected post op acute blood loss anemia, mild Expected post op atelectasis, moderate  Ambulate Gentle diuresis Continue current care Stepdown later today or tomorrow , defer to Dr. Mervyn Skeeters, B[place], PA-S 12/28/2015 7:52 AM   I have seen and examined the patient and agree with the assessment and plan as outlined.  Doing well.  Mobilize and transfer 2W.  Possibly ready for d/c home 2-3 days.  Rexene Alberts, MD 12/28/2015 8:17 AM

## 2015-12-28 NOTE — Plan of Care (Signed)
Problem: Phase I Progression Outcomes Goal: Pain controlled with appropriate interventions Outcome: Completed/Met Date Met:  12/28/15 oxycodone Goal: Initial discharge plan identified Outcome: Completed/Met Date Met:  12/28/15 With parents

## 2015-12-28 NOTE — Care Management Note (Signed)
Case Management Note  Patient Details  Name: VERDON GRIMAUD MRN: [id number] Date of Birth: 1971-11-30  Subjective/Objective:    Pt lives with parents, has had no equipment needs.  Cardiac Rehab to eval.                     Expected Discharge Plan:  Home/Self Care  Discharge planning Services  CM Consult  Status of Service:  In process, will continue to follow  Girard Cooter, RN 12/28/2015, 12:06 PM

## 2015-12-28 NOTE — Discharge Instructions (Signed)
Coronary Artery Bypass Grafting, Care After °Refer to this sheet in the next few weeks. These instructions provide you with information on caring for yourself after your procedure. Your health care provider may also give you more specific instructions. Your treatment has been planned according to current medical practices, but problems sometimes occur. Call your health care provider if you have any problems or questions after your procedure. °WHAT TO EXPECT AFTER THE PROCEDURE °Recovery from surgery will be different for everyone. Some people feel well after 3 or 4 weeks, while for others it takes longer. After your procedure, it is typical to have the following: °· Nausea and a lack of appetite.   °· Constipation. °· Weakness and fatigue.   °· Depression or irritability.   °· Pain or discomfort at your incision site. °HOME CARE INSTRUCTIONS °· Take medicines only as directed by your health care provider. Do not stop taking medicines or start any new medicines without first checking with your health care provider. °· Take your pulse as directed by your health care provider. °· Perform deep breathing as directed by your health care provider. If you were given a device called an incentive spirometer, use it to practice deep breathing several times a day. Support your chest with a pillow or your arms when you take deep breaths or cough. °· Keep incision areas clean, dry, and protected. Remove or change any bandages (dressings) only as directed by your health care provider. You may have skin adhesive strips over the incision areas. Do not take the strips off. They will fall off on their own. °· Check incision areas daily for any swelling, redness, or drainage. °· If incisions were made in your legs, do the following: °¨ Avoid crossing your legs.   °¨ Avoid sitting for long periods of time. Change positions every 30 minutes.   °¨ Elevate your legs when you are sitting. °· Wear compression stockings as directed by your  health care provider. These stockings help keep blood clots from forming in your legs. °· Take showers once your health care provider approves. Until then, only take sponge baths. Pat incisions dry. Do not rub incisions with a washcloth or towel. Do not take baths, swim, or use a hot tub until your health care provider approves. °· Eat foods that are high in fiber, such as raw fruits and vegetables, whole grains, beans, and nuts. Meats should be lean cut. Avoid canned, processed, and fried foods. °· Drink enough fluid to keep your urine clear or pale yellow. °· Weigh yourself every day. This helps identify if you are retaining fluid that may make your heart and lungs work harder. °· Rest and limit activity as directed by your health care provider. You may be instructed to: °¨ Stop any activity at once if you have chest pain, shortness of breath, irregular heartbeats, or dizziness. Get help right away if you have any of these symptoms. °¨ Move around frequently for short periods or take short walks as directed by your health care provider. Increase your activities gradually. You may need physical therapy or cardiac rehabilitation to help strengthen your muscles and build your endurance. °¨ Avoid lifting, pushing, or pulling anything heavier than 10 lb (4.5 kg) for at least 6 weeks after surgery. °· Do not drive until your health care provider approves.  °· Ask your health care provider when you may return to work. °· Ask your health care provider when you may resume sexual activity. °· Keep all follow-up visits as directed by your health care   provider. This is important. °SEEK MEDICAL CARE IF: °· You have swelling, redness, increasing pain, or drainage at the site of an incision. °· You have a fever. °· You have swelling in your ankles or legs. °· You have pain in your legs.   °· You gain 2 or more pounds (0.9 kg) a day. °· You are nauseous or vomit. °· You have diarrhea.  °SEEK IMMEDIATE MEDICAL CARE IF: °· You have  chest pain that goes to your jaw or arms. °· You have shortness of breath.   °· You have a fast or irregular heartbeat.   °· You notice a "clicking" in your breastbone (sternum) when you move.   °· You have numbness or weakness in your arms or legs. °· You feel dizzy or light-headed.   °MAKE SURE YOU: °· Understand these instructions. °· Will watch your condition. °· Will get help right away if you are not doing well or get worse. °  °This information is not intended to replace advice given to you by your health care provider. Make sure you discuss any questions you have with your health care provider. °  °Document Released: 06/06/2005 Document Revised: 12/08/2014 Document Reviewed: 04/26/2013 °Elsevier Interactive Patient Education ©2016 [employer] ° °

## 2015-12-28 NOTE — Plan of Care (Signed)
Problem: Activity: Goal: Risk for activity intolerance will decrease Outcome: Progressing Up to chair x 3.5 hours  Problem: Bowel/Gastric: Goal: Gastrointestinal status for postoperative course will improve Outcome: Progressing Liquid BM today. Held colace and bisacodyl  Problem: Cardiac: Goal: Hemodynamic stability will improve Outcome: Progressing Atenolol restarted today.  Problem: Physical Regulation: Goal: Postoperative complications will be avoided or minimized Outcome: Progressing Getting 750-500 on IS. Coughing and deep breathing. Blood sugars decreasing and changed to ac and hs. Neuro intact. Flat affect  Problem: Respiratory: Goal: Levels of oxygenation will improve Outcome: Progressing decreaased to 2 L O2 per [place]. satas 96-100.  Problem: Pain Management: Goal: Pain level will decrease Outcome: Progressing Oxycodone 10 mg po alleviates pain.   Problem: Skin Integrity: Goal: Wound will heal without signs and symptoms of infection Outcome: Progressing Sacral dressing removed. No areas of redness. intact  Problem: Tissue Perfusion: Goal: Risk of venous thrombosis will decrease Outcome: Progressing lovenox started and wearing scds  Problem: Urinary Elimination: Goal: Ability to achieve and maintain adequate renal perfusion and functioning will improve Outcome: Progressing Foley catheter discontinued yesterday. Voiding on his own. Responded well to lasix last pm

## 2015-12-28 NOTE — Discharge Summary (Signed)
Physician Discharge Summary  Patient ID: Sean [name] MRN: [id number] DOB/AGE: [birth year] 45 y.o.  Admit date: 12/23/2015 Discharge date: 12/31/2015  Admission Diagnoses:  Patient Active Problem List   Diagnosis Date Noted  . CAD (coronary artery disease)   . OSA (obstructive sleep apnea)   . Unstable angina ([place])   . Chest pain 12/23/2015  . Essential hypertension 12/23/2015  . Hyperlipidemia 12/23/2015  . Renal insufficiency 12/23/2015  . Thrombocytopenia ([place]) 12/23/2015  . Hyperglycemia 12/23/2015  . Sinus bradycardia 12/23/2015  . Incisional hernia 07/10/2011   Discharge Diagnoses:   Patient Active Problem List   Diagnosis Date Noted  . 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 CAD (coronary artery disease)   . OSA (obstructive sleep apnea)   . Unstable angina ([place])   . Chest pain 12/23/2015  . Essential hypertension 12/23/2015  . Hyperlipidemia 12/23/2015  . Renal insufficiency 12/23/2015  . Thrombocytopenia ([place]) 12/23/2015  . Hyperglycemia 12/23/2015  . Sinus bradycardia 12/23/2015  . Incisional hernia 07/10/2011   Discharged Condition: good  History of Present Illness:  [name] is a 45 yo morbidly obese white male with no previous cardiac history.  He does have risk factors of Hypertension and Hyperlipidemia.  The patient states he has been having exertional shortness of breath for the past 10 years.  However over the past month the patient has noticed his symptoms have progressed and how his exercise tolerance has decreased.  Several days prior to admission the patient developed substernal chest pressure/heaviness that was exertional but relieved with rest.  These symptoms progressed over the next several days, eventually leading to an episode that occurred at rest.  He presented to the ED for evaluation.  EKG obtained showed Sinus Bradycardia without evidence of ischemic changes.  Serial Troponin levels were minimally elevated.  The patient continued to have  chest pain which would alleviated with use of Nitroglycerin.  He was admitted for further workup.  He underwent cardiac catheterization on 12/24/2015 which showed a preserved EF and critical Left Main stenosis.  It was felt coronary bypass grafting would be indicated and TCTS consult was obtained.  He was evaluated by Dr. Roxy Manns on 12/25/2015 at which time he was in agreement the patient should have a single vessel bypass procedure.  The risks and benefits of the procedure were explained to the patient and he was agreeable to proceed.    Hospital Course:   He was taken to the operating room on 12/26/2015.  He underwent Off Pump CABG x 1 utilizing LIMA to LAD.  He tolerated the procedure without difficulty and was taken to the SICU in stable condition.  During his stay in the SICU the patient was extubated on POD #1.  His chest tubes and arterial lines were removed without difficulty.  He was maintaining NSR and ambulating around the SICU.  He was felt medically stable for transfer to the telemetry unit on POD #2.  The patient continued to make progress.  He continues to maintain NSR and his pacing wires have been removed without difficulty.  He continues to ambulate without difficulty.  He is tolerating a heart healthy diet.  He is felt medically stable for discharge home today.   Significant Diagnostic Studies: angiography:    Prox RCA lesion, 20% stenosed.  Mid RCA to Dist RCA lesion, 20% stenosed.  Ost LAD to Prox LAD lesion, 95% stenosed.  Mid LAD lesion, 40% stenosed.  Ost 1st Mrg to 1st Mrg lesion, 40  Treatments:  surgery:    Off-pump Coronary Artery Bypass Grafting x 1 Left Internal Mammary Artery to Distal Left Anterior Descending Coronary Artery  Disposition: 01-Home or Self Care   Discharge medications:  The patient has been discharged on:   1.Beta Blocker:  Yes [ x  ]                              No   [   ]                              If No, reason:  2.Ace  Inhibitor/ARB: Yes [   ]                                     No  [  x  ]                                     If No, reason: labile BP  3.Statin:   Yes [x   ]                  No  [   ]                  If No, reason:  4.Ecasa:  Yes  [ x  ]                  No   [   ]                  If No, reason:       Discharge Instructions    Amb Referral to Cardiac Rehabilitation    Complete by:  As directed   Diagnosis:  CABG            Medication List    STOP taking these medications        furosemide 40 MG tablet  Commonly known as:  LASIX     losartan 100 MG tablet  Commonly known as:  COZAAR      TAKE these medications        aspirin 325 MG EC tablet  Take 1 tablet (325 mg total) by mouth daily.     atenolol 25 MG tablet  Commonly known as:  TENORMIN  Take 0.5 tablets (12.5 mg total) by mouth 2 (two) times daily.     cetirizine 10 MG tablet  Commonly known as:  ZYRTEC  Take 10 mg by mouth daily.     omeprazole 20 MG capsule  Commonly known as:  PRILOSEC  Take 20 mg by mouth daily.     oxyCODONE 5 MG immediate release tablet  Commonly known as:  Oxy IR/ROXICODONE  Take 1-2 tablets (5-10 mg total) by mouth every 3 (three) hours as needed for severe pain.     rosuvastatin 40 MG tablet  Commonly known as:  CRESTOR  Take 1 tablet (40 mg total) by mouth daily at 6 PM.     traMADol 50 MG tablet  Commonly known as:  ULTRAM  Take 1-2 tablets (50-100 mg total) by mouth every 4 (four) hours as needed for moderate pain.       Follow-up Information    Follow  up with Rexene Alberts, MD On 01/28/2016.   Specialty:  Cardiothoracic Surgery   Why:  Appointment is at 2:30   Contact information:   [address] [place] [address] [phone]       Follow up with [facility] On 01/28/2016.   Why:  Please get CXR at 2:00   Contact information:   [facility]       Follow up with Eileen Stanford, PA-C On 01/08/2016.   Specialties:   Cardiology, Radiology   Why:  Appointment is at 9:30   Contact information:   [place] [address] [phone]       Signed: Ellwood Handler 12/31/2015, 9:05 AM

## 2015-12-28 NOTE — Progress Notes (Signed)
CARDIAC REHAB PHASE I   PRE:  Rate/Rhythm: 67 SR    BP: sitting 83/48    SaO2: 96 2L   MODE:  Ambulation: 400 ft   POST:  Rate/Rhythm: 88 SR    BP: sitting 90/52     SaO2: 92 2L  Pt able to stand and ambulate with RW, 2L, supervision assist. Slight SOB, SaO2 91-92 2L while walking. To bed after walk, needed assist to get legs up into bed. BP low but asymptomatic. Encouraged IS and x1 more walk.  [id number]   Sean [name] [place] CES, ACSM 12/28/2015 3:14 PM

## 2015-12-28 NOTE — Progress Notes (Signed)
[address]       [place] [address]             [phone]        CARDIOTHORACIC SURGERY PROGRESS NOTE   R2 Days Post-Op Procedure(s) (LRB): Off Pump Coronary Artery Bypass Grafting times one using left internal mammary artery (N/A) TRANSESOPHAGEAL ECHOCARDIOGRAM (TEE) (N/A)  Subjective: No complaints.  States that he feels better than he has in a long time.  Slept well overnight and did not require BiPAP  Objective: Vital signs: BP Readings from Last 1 Encounters:  12/28/15 98/67   Pulse Readings from Last 1 Encounters:  12/28/15 79   Resp Readings from Last 1 Encounters:  12/28/15 26   Temp Readings from Last 1 Encounters:  12/28/15 98.1 F (36.7 C) Oral    Hemodynamics:    Physical Exam:  Rhythm:   sinus  Breath sounds: clear  Heart sounds:  RRR  Incisions:  Dressing dry, intact  Abdomen:  Soft, non-distended, non-tender  Extremities:  Warm, well-perfused    Intake/Output from previous day: 01/26 0701 - 01/27 0700 In: 592.3 [P.O.:480; I.V.:12.3; IV Piggyback:100] Out: 2465 [Urine:2245; Chest Tube:220] Intake/Output this shift:    Lab Results:  CBC: Recent Labs  12/27/15 1640 12/27/15 1647 12/28/15 0430  WBC 18.6*  --  17.4*  HGB 12.5* 13.3 12.6*  HCT 39.1 39.0 38.4*  PLT 79*  --  82*    BMET:  Recent Labs  12/27/15 0410  12/27/15 1647 12/28/15 0430  NA 140  --  141 137  K 4.9  --  4.4 4.4  CL 107  --  103 106  CO2 25  --   --  27  GLUCOSE 126*  --  137* 159*  BUN 15  --  21* 19  CREATININE 1.38*  < > 1.40* 1.54*  CALCIUM 8.4*  --   --  8.4*  < > = values in this interval not displayed.   PT/INR:   Recent Labs  12/26/15 1259  LABPROT 15.1  INR 1.17    CBG (last 3)   Recent Labs  12/27/15 2026 12/27/15 2302 12/28/15 0604  GLUCAP 121* 217* 156*    ABG    Component Value Date/Time   PHART 7.380 12/27/2015 1139   PCO2ART 47.9* 12/27/2015 1139   PO2ART 79.0* 12/27/2015 1139   HCO3 24.0  12/27/2015 1629   TCO2 27 12/27/2015 1647   ACIDBASEDEF 3.0* 12/27/2015 1629   O2SAT 65.0 12/27/2015 1629    CXR: PORTABLE CHEST 1 VIEW  COMPARISON: 12/27/2015  FINDINGS: Sequelae of recent CABG are again identified. Endotracheal, enteric, mediastinal, and chest tubes have been removed. Right jugular sheath remains in place. The cardiac silhouette remains enlarged. The left hemidiaphragm remains elevated with gaseous distention of underlying splenic flexure noted. Subsegmental atelectasis in the left lower lung is unchanged. No sizable pleural effusion, pneumothorax, or overt edema is identified.  IMPRESSION: 1. Interval removal of multiple support devices as above. 2. Unchanged left basilar atelectasis.   Electronically Signed  By: Logan Bores M.D.  On: 12/28/2015 07:52  Assessment/Plan: S/P Procedure(s) (LRB): Off Pump Coronary Artery Bypass Grafting times one using left internal mammary artery (N/A) TRANSESOPHAGEAL ECHOCARDIOGRAM (TEE) (N/A)  Doing well POD2 Maintaining NSR w/ stable BP O2 sats 98-100% on 3 L/min Expected post op acute blood loss anemia, very mild Expected post op atelectasis, stable Morbid obesity Chronic thrombocytopenia, mild, platelet count stable 82k Hypertension Hypercholesterolemia   Mobilize  Pulm toilet  Continue ASA, beta blocker, statin  Hold ARB for now and restart if BP will allow  Weight loss counseling  Transfer 2W  Anticipate possible d/c home 2-3 days  Rexene Alberts, MD 12/28/2015 8:03 AM

## 2015-12-29 ENCOUNTER — Inpatient Hospital Stay (HOSPITAL_COMMUNITY): Payer: BLUE CROSS/BLUE SHIELD

## 2015-12-29 LAB — CBC
HEMATOCRIT: 36 % — AB (ref 39.0–52.0)
Hemoglobin: 11.4 g/dL — ABNORMAL LOW (ref 13.0–17.0)
MCH: 31.2 pg (ref 26.0–34.0)
MCHC: 31.7 g/dL (ref 30.0–36.0)
MCV: 98.6 fL (ref 78.0–100.0)
PLATELETS: 89 10*3/uL — AB (ref 150–400)
RBC: 3.65 MIL/uL — AB (ref 4.22–5.81)
RDW: 14.3 % (ref 11.5–15.5)
WBC: 14.4 10*3/uL — AB (ref 4.0–10.5)

## 2015-12-29 LAB — GLUCOSE, CAPILLARY
GLUCOSE-CAPILLARY: 132 mg/dL — AB (ref 65–99)
GLUCOSE-CAPILLARY: 99 mg/dL (ref 65–99)
Glucose-Capillary: 113 mg/dL — ABNORMAL HIGH (ref 65–99)
Glucose-Capillary: 152 mg/dL — ABNORMAL HIGH (ref 65–99)

## 2015-12-29 LAB — BASIC METABOLIC PANEL
ANION GAP: 6 (ref 5–15)
BUN: 29 mg/dL — ABNORMAL HIGH (ref 6–20)
CALCIUM: 8.6 mg/dL — AB (ref 8.9–10.3)
CO2: 29 mmol/L (ref 22–32)
Chloride: 105 mmol/L (ref 101–111)
Creatinine, Ser: 1.82 mg/dL — ABNORMAL HIGH (ref 0.61–1.24)
GFR, EST AFRICAN AMERICAN: 51 mL/min — AB (ref >60)
GFR, EST NON AFRICAN AMERICAN: 44 mL/min — AB (ref >60)
GLUCOSE: 120 mg/dL — AB (ref 65–99)
POTASSIUM: 4.9 mmol/L (ref 3.5–5.1)
Sodium: 140 mmol/L (ref 135–145)

## 2015-12-29 NOTE — Progress Notes (Signed)
CARDIAC REHAB PHASE I   PRE:  Rate/Rhythm: 76 SR  BP:  Supine:   Sitting: 96/52  Standing:    SaO2: 99 2 L  MODE:  Ambulation: 400 ft   POST:  Rate/Rhythm: 69 SR  BP:  Supine:   Sitting: 100/58  Standing:    SaO2: 93 RA left oxygen off, reported to nurse  Needs lots of encouragement, afraid he is going to disturb his chest incision.  Tolerated ambulation well with rolling walker and assistance x 1 once up.  Due to his size getting up and down are more difficult.  Instructed and able to demonstrate IS.  Discharge instructions completed [id number] precautions, activity restrictions, exercise progression, nutrition tips, referred to [facility] in [place].   [id number]  Liliane Channel RN, BSN 12/29/2015 12:11 PM   2

## 2015-12-29 NOTE — Progress Notes (Addendum)
[address]       [place], [address]             [phone]      3 Days Post-Op Procedure(s) (LRB): Off Pump Coronary Artery Bypass Grafting times one using left internal mammary artery (N/A) TRANSESOPHAGEAL ECHOCARDIOGRAM (TEE) (N/A) Subjective: Minimal pain, hasn't done much ambulation   Objective: Vital signs in last 24 hours: Temp:  [98.3 F (36.8 C)-98.6 F (37 C)] 98.3 F (36.8 C) (01/28 0440) Pulse Rate:  [56-78] 61 (01/28 0440) Cardiac Rhythm:  [-] Sinus bradycardia (01/28 0723) Resp:  [18-28] 18 (01/28 0440) BP: (78-105)/(46-67) 105/60 mmHg (01/28 0440) SpO2:  [98 %-100 %] 100 % (01/28 0440) Weight:  [343 lb 14.7 oz (156 kg)] 343 lb 14.7 oz (156 kg) (01/28 0440)  Hemodynamic parameters for last 24 hours:    Intake/Output from previous day: 01/27 0701 - 01/28 0700 In: 630 [P.O.:550; I.V.:80] Out: 400 [Urine:400] Intake/Output this shift:    General appearance: alert, cooperative and no distress Heart: regular rate and rhythm Lungs: dim in lower fields Abdomen: obese, nontender Extremities: minor edema Wound: dressing CDI  Lab Results:  Recent Labs  12/28/15 0430 12/29/15 0305  WBC 17.4* 14.4*  HGB 12.6* 11.4*  HCT 38.4* 36.0*  PLT 82* 89*   BMET:  Recent Labs  12/28/15 0430 12/29/15 0305  NA 137 140  K 4.4 4.9  CL 106 105  CO2 27 29  GLUCOSE 159* 120*  BUN 19 29*  CREATININE 1.54* 1.82*  CALCIUM 8.4* 8.6*    PT/INR:  Recent Labs  12/26/15 1259  LABPROT 15.1  INR 1.17   ABG    Component Value Date/Time   PHART 7.380 12/27/2015 1139   HCO3 24.0 12/27/2015 1629   TCO2 27 12/27/2015 1647   ACIDBASEDEF 3.0* 12/27/2015 1629   O2SAT 65.0 12/27/2015 1629   CBG (last 3)   Recent Labs  12/28/15 1708 12/28/15 2106 12/29/15 0609  GLUCAP 106* 114* 113*    Meds Scheduled Meds: . acetaminophen  1,000 mg Oral 4 times per day  . antiseptic oral rinse  7 mL Mouth Rinse QID  . aspirin EC  325 mg Oral  Daily  . atenolol  25 mg Oral BID  . bisacodyl  10 mg Oral Daily   Or  . bisacodyl  10 mg Rectal Daily  . docusate sodium  200 mg Oral Daily  . enoxaparin (LOVENOX) injection  40 mg Subcutaneous Q24H  . furosemide  40 mg Oral Daily  . insulin aspart  0-24 Units Subcutaneous TID AC & HS  . pantoprazole  40 mg Oral Daily  . potassium chloride  20 mEq Oral Daily  . rosuvastatin  40 mg Oral q1800  . sodium chloride flush  3 mL Intravenous Q12H  . sodium chloride flush  3 mL Intravenous Q12H   Continuous Infusions: . sodium chloride     PRN Meds:.sodium chloride, metoprolol, morphine injection, ondansetron (ZOFRAN) IV, oxyCODONE, sodium chloride flush, sodium chloride flush, traMADol  Xrays Dg Chest 2 View  12/29/2015  CLINICAL DATA:  Status post coronary bypass grafting EXAM: CHEST  2 VIEW COMPARISON:  12/28/2015 FINDINGS: Cardiac shadow remains enlarged. Elevation left hemidiaphragm is again seen with associated atelectatic changes. No new focal infiltrate or sizable effusion is seen. No bony abnormality is noted. IMPRESSION: Stable left basilar atelectasis. Electronically Signed   By: Inez Catalina M.D.   On: 12/29/2015 07:36   Dg Chest Port 1  View  12/28/2015  CLINICAL DATA:  Atelectasis.  Status post CABG. EXAM: PORTABLE CHEST 1 VIEW COMPARISON:  12/27/2015 FINDINGS: Sequelae of recent CABG are again identified. Endotracheal, enteric, mediastinal, and chest tubes have been removed. Right jugular sheath remains in place. The cardiac silhouette remains enlarged. The left hemidiaphragm remains elevated with gaseous distention of underlying splenic flexure noted. Subsegmental atelectasis in the left lower lung is unchanged. No sizable pleural effusion, pneumothorax, or overt edema is identified. IMPRESSION: 1. Interval removal of multiple support devices as above. 2. Unchanged left basilar atelectasis. Electronically Signed   By: Logan Bores M.D.   On: 12/28/2015 07:52     Assessment/Plan: S/P Procedure(s) (LRB): Off Pump Coronary Artery Bypass Grafting times one using left internal mammary artery (N/A) TRANSESOPHAGEAL ECHOCARDIOGRAM (TEE) (N/A)  1 stable 2 SR/ SBrady- monitor on current atenolol dose 3 BP too low for ARB currently 4 discussed emphasis on ambulation /rehab 5 pulm toilet 6 creat is slowly rising, will stop lasix for now, may be a bit intravasc dry 7 H/H fairly stable, leukocytosis improving, thrombocytopenia slowly rising 8 ON lovenox 40 q 24, may need higher dose if doesn't get more mobile soon    LOS: 4 days    GOLD,WAYNE E 12/29/2015   Chart reviewed, patient examined, agree with above.

## 2015-12-30 LAB — CBC
HCT: 34.4 % — ABNORMAL LOW (ref 39.0–52.0)
HEMOGLOBIN: 11.1 g/dL — AB (ref 13.0–17.0)
MCH: 31.4 pg (ref 26.0–34.0)
MCHC: 32.3 g/dL (ref 30.0–36.0)
MCV: 97.5 fL (ref 78.0–100.0)
PLATELETS: 102 10*3/uL — AB (ref 150–400)
RBC: 3.53 MIL/uL — AB (ref 4.22–5.81)
RDW: 14.2 % (ref 11.5–15.5)
WBC: 9.8 10*3/uL (ref 4.0–10.5)

## 2015-12-30 LAB — BASIC METABOLIC PANEL
Anion gap: 7 (ref 5–15)
BUN: 29 mg/dL — AB (ref 6–20)
CHLORIDE: 105 mmol/L (ref 101–111)
CO2: 26 mmol/L (ref 22–32)
Calcium: 8.3 mg/dL — ABNORMAL LOW (ref 8.9–10.3)
Creatinine, Ser: 1.38 mg/dL — ABNORMAL HIGH (ref 0.61–1.24)
GFR calc non Af Amer: >60 mL/min (ref >60)
GLUCOSE: 116 mg/dL — AB (ref 65–99)
POTASSIUM: 3.9 mmol/L (ref 3.5–5.1)
SODIUM: 138 mmol/L (ref 135–145)

## 2015-12-30 LAB — GLUCOSE, CAPILLARY
GLUCOSE-CAPILLARY: 104 mg/dL — AB (ref 65–99)
GLUCOSE-CAPILLARY: 134 mg/dL — AB (ref 65–99)
GLUCOSE-CAPILLARY: 142 mg/dL — AB (ref 65–99)
GLUCOSE-CAPILLARY: 89 mg/dL (ref 65–99)

## 2015-12-30 MED ORDER — ATENOLOL 25 MG PO TABS
12.5000 mg | ORAL_TABLET | Freq: Two times a day (BID) | ORAL | Status: DC
  Administered 2015-12-30 – 2015-12-31 (×2): 12.5 mg via ORAL
  Filled 2015-12-30 (×2): qty 1

## 2015-12-30 NOTE — Progress Notes (Addendum)
[address]       [place] [address]             [phone]      4 Days Post-Op Procedure(s) (LRB): Off Pump Coronary Artery Bypass Grafting times one using left internal mammary artery (N/A) TRANSESOPHAGEAL ECHOCARDIOGRAM (TEE) (N/A) Subjective: looks and feels better , walked 3 times yesterday  Objective: Vital signs in last 24 hours: Temp:  [98.3 F (36.8 C)-98.9 F (37.2 C)] 98.3 F (36.8 C) (01/29 0550) Pulse Rate:  [66-72] 72 (01/29 0550) Cardiac Rhythm:  [-] Normal sinus rhythm (01/29 0721) Resp:  [18] 18 (01/29 0550) BP: (99-106)/(52-55) 102/54 mmHg (01/29 0550) SpO2:  [92 %-94 %] 93 % (01/29 0550) Weight:  [341 lb 4.8 oz (154.813 kg)] 341 lb 4.8 oz (154.813 kg) (01/29 0550)  Hemodynamic parameters for last 24 hours:    Intake/Output from previous day: 01/28 0701 - 01/29 0700 In: 846 [P.O.:840; I.V.:6] Out: 1310 [Urine:1310] Intake/Output this shift:    General appearance: alert, cooperative, fatigued and no distress Heart: regular rate and rhythm Lungs: clear to auscultation bilaterally Abdomen: bemign, obese Extremities: minor edema Wound: dressing CDI  Lab Results:  Recent Labs  12/29/15 0305 12/30/15 0506  WBC 14.4* 9.8  HGB 11.4* 11.1*  HCT 36.0* 34.4*  PLT 89* 102*   BMET:  Recent Labs  12/29/15 0305 12/30/15 0506  NA 140 138  K 4.9 3.9  CL 105 105  CO2 29 26  GLUCOSE 120* 116*  BUN 29* 29*  CREATININE 1.82* 1.38*  CALCIUM 8.6* 8.3*    PT/INR: No results for input(s): LABPROT, INR in the last 72 hours. ABG    Component Value Date/Time   PHART 7.380 12/27/2015 1139   HCO3 24.0 12/27/2015 1629   TCO2 27 12/27/2015 1647   ACIDBASEDEF 3.0* 12/27/2015 1629   O2SAT 65.0 12/27/2015 1629   CBG (last 3)   Recent Labs  12/29/15 1605 12/29/15 2137 12/30/15 0646  GLUCAP 152* 99 104*    Meds Scheduled Meds: . acetaminophen  1,000 mg Oral 4 times per day  . antiseptic oral rinse  7 mL Mouth Rinse QID    . aspirin EC  325 mg Oral Daily  . atenolol  25 mg Oral BID  . bisacodyl  10 mg Oral Daily   Or  . bisacodyl  10 mg Rectal Daily  . docusate sodium  200 mg Oral Daily  . enoxaparin (LOVENOX) injection  40 mg Subcutaneous Q24H  . insulin aspart  0-24 Units Subcutaneous TID AC & HS  . pantoprazole  40 mg Oral Daily  . rosuvastatin  40 mg Oral q1800  . sodium chloride flush  3 mL Intravenous Q12H  . sodium chloride flush  3 mL Intravenous Q12H   Continuous Infusions: . sodium chloride     PRN Meds:.sodium chloride, metoprolol, morphine injection, ondansetron (ZOFRAN) IV, oxyCODONE, sodium chloride flush, sodium chloride flush, traMADol  Xrays Dg Chest 2 View  12/29/2015  CLINICAL DATA:  Status post coronary bypass grafting EXAM: CHEST  2 VIEW COMPARISON:  12/28/2015 FINDINGS: Cardiac shadow remains enlarged. Elevation left hemidiaphragm is again seen with associated atelectatic changes. No new focal infiltrate or sizable effusion is seen. No bony abnormality is noted. IMPRESSION: Stable left basilar atelectasis. Electronically Signed   By: Inez Catalina M.D.   On: 12/29/2015 07:36    Assessment/Plan: S/P Procedure(s) (LRB): Off Pump Coronary Artery Bypass Grafting times one using left internal mammary artery (N/A)  TRANSESOPHAGEAL ECHOCARDIOGRAM (TEE) (N/A)  1 looks much better today, endurance and activity are improved but deconditioning/obesity is limiting 2 creat is improved off lasix 3 remains brady at times to 40's - will reduce atenolol dose. BP remains too low for ARB to be restarted 4 sugars pretty well controlled- A1C 5.6 preop 5 cont rehab- should be ready for d/c in am if no new issues  LOS: 5 days    GOLD,WAYNE E 12/30/2015   Chart reviewed, patient examined, agree with above.

## 2015-12-30 NOTE — Progress Notes (Signed)
C/o feeling hot. Currently up in reclining chair. VS taken - temp 98.6 - 67/ regular - 18, 95/55. O2 Sats 94 room air. States back pain has resolved. Room temp turned down.

## 2015-12-30 NOTE — Progress Notes (Signed)
Patient ambulated 400 ft on room air without walker. Family walked with patient.  Patient tolerated well. RN will continue to monitor patient

## 2015-12-31 LAB — GLUCOSE, CAPILLARY: Glucose-Capillary: 95 mg/dL (ref 65–99)

## 2015-12-31 MED ORDER — ROSUVASTATIN CALCIUM 40 MG PO TABS: 40.0000 mg | ORAL_TABLET | Freq: Every day | ORAL | Status: DC

## 2015-12-31 MED ORDER — TRAMADOL HCL 50 MG PO TABS: 50.0000 mg | ORAL_TABLET | ORAL | Status: DC | PRN

## 2015-12-31 MED ORDER — OXYCODONE HCL 5 MG PO TABS: 5.0000 mg | ORAL_TABLET | ORAL | Status: DC | PRN

## 2015-12-31 MED ORDER — ASPIRIN 325 MG PO TBEC: 325.0000 mg | DELAYED_RELEASE_TABLET | Freq: Every day | ORAL | Status: DC

## 2015-12-31 MED ORDER — ATENOLOL 25 MG PO TABS: 12.5000 mg | ORAL_TABLET | Freq: Two times a day (BID) | ORAL | Status: DC

## 2015-12-31 NOTE — Progress Notes (Addendum)
      [address]       [place] [address]             [phone]      5 Days Post-Op Procedure(s) (LRB): Off Pump Coronary Artery Bypass Grafting times one using left internal mammary artery (N/A) TRANSESOPHAGEAL ECHOCARDIOGRAM (TEE) (N/A)   Subjective:  Mr. Sean [name] has no complaints.  He states he is feeling pretty good.  + ambulation  + BM  Objective: Vital signs in last 24 hours: Temp:  [98.2 F (36.8 C)-98.7 F (37.1 C)] 98.2 F (36.8 C) (01/30 0533) Pulse Rate:  [67-75] 75 (01/30 0533) Cardiac Rhythm:  [-] Normal sinus rhythm (01/29 2000) Resp:  [18] 18 (01/30 0533) BP: (94-110)/(49-62) 110/62 mmHg (01/30 0533) SpO2:  [94 %-97 %] 95 % (01/30 0533) Weight:  [338 lb 12.8 oz (153.679 kg)] 338 lb 12.8 oz (153.679 kg) (01/30 0533)  Intake/Output from previous day: 01/29 0701 - 01/30 0700 In: 729 [P.O.:720; I.V.:9] Out: 2452 [Urine:2450; Stool:2]  General appearance: alert, cooperative and no distress Heart: regular rate and rhythm Lungs: clear to auscultation bilaterally Abdomen: soft, non-tender; bowel sounds normal; no masses,  no organomegaly Extremities: edema trace Wound: clean and dry  Lab Results:  Recent Labs  12/29/15 0305 12/30/15 0506  WBC 14.4* 9.8  HGB 11.4* 11.1*  HCT 36.0* 34.4*  PLT 89* 102*   BMET:  Recent Labs  12/29/15 0305 12/30/15 0506  NA 140 138  K 4.9 3.9  CL 105 105  CO2 29 26  GLUCOSE 120* 116*  BUN 29* 29*  CREATININE 1.82* 1.38*  CALCIUM 8.6* 8.3*    PT/INR: No results for input(s): LABPROT, INR in the last 72 hours. ABG    Component Value Date/Time   PHART 7.380 12/27/2015 1139   HCO3 24.0 12/27/2015 1629   TCO2 27 12/27/2015 1647   ACIDBASEDEF 3.0* 12/27/2015 1629   O2SAT 65.0 12/27/2015 1629   CBG (last 3)   Recent Labs  12/30/15 1619 12/30/15 2130 12/31/15 0643  GLUCAP 142* 89 95    Assessment/Plan: S/P Procedure(s) (LRB): Off Pump Coronary Artery Bypass Grafting times one using  left internal mammary artery (N/A) TRANSESOPHAGEAL ECHOCARDIOGRAM (TEE) (N/A)  1. CV- NSR, HR improved, running 60-70s now- continue atenolol at reduced dose, no ACE/ARB at this time 2. Pulm- no acute issues, off oxygen, continue IS 3. Renal- creatinine has been WNL, weight is below baseline, not diuresis 4. CBGs- controlled, preop A1c is 5.6, not a diabetic will d/c SSIP 5. Dispo- patient stable, HR improved, possibly ready for discharge   LOS: 6 days    Sean [name] 12/31/2015  I have seen and examined the patient and agree with the assessment and plan as outlined.  D/C home today  Rexene Alberts, MD 12/31/2015 8:59 AM

## 2015-12-31 NOTE — Care Management Note (Signed)
Case Management Note Previous CM note initiated by [facility] RN, CM  Patient Details  Name: Sean [name] MRN: [id number] Date of Birth: [birth year]  Subjective/Objective:    Pt lives with parents, has had no equipment needs.  Cardiac Rehab to eval.                                     Action/Plan: S/p CABG plan to return home with parents- per Cardiac Rehab pt will need RW for home-   Expected Discharge Date: 12/31/15               Expected Discharge Plan:  Home/Self Care  In-House Referral:     Discharge planning Services  CM Consult  Post Acute Care Choice:  Durable Medical Equipment Choice offered to:  Patient  DME Arranged:  [name] rolling DME Agency:  [place]:  NA [place] Agency:  NA  Status of Service:  Completed, signed off  Medicare Important Message Given:    Date Medicare IM Given:    Medicare IM give by:    Date Additional Medicare IM Given:    Additional Medicare Important Message give by:     If discussed at Long Length of Stay Meetings, dates discussed:    Discharge Disposition:  Home/self care   Additional Comments:  12/31/15- spoke with pt and parents at bedside- confirmed that pt does want a RW for home- order placed- call made to [facility] with [facility] who will deliver RW to room prior to discharge- pt also asked about a shower bench/chair- explained that insurance does not cover this DME and advised pt/family that is was  cheaper to purchase from local store such as [employer] or [facility] if needed. Pt also asked about disability and how to apply- lives in [place]- advised pt to go to [employer] DSS and ask about disability application and process for applying. No further CM needs noted.   Dawayne Patricia, RN 12/31/2015, 10:28 AM

## 2015-12-31 NOTE — Progress Notes (Signed)
Discharge Note:  Patient alert and oriented X 4 and in no distress.  His only complaint at this time is moderate aching of his back and buttocks from sitting.  Patient and his parents given instructions regarding signs/symptoms of post operative complications to report, medications, activity, and upcoming appointments. Patient and his parents all verbalized understanding of discharge instructions.  Telemetry and peripheral IV's discontinued.   Chest tube sutures also removed.  Suture sites cleansed with betadine and steri strips applied. Patient and mother agreed to monitor these sites for s/s of infection as well as surgical incisions.  Patient will be transported out via wheelchair by hospital volunteer.

## 2016-01-02 ENCOUNTER — Other Ambulatory Visit: Payer: Self-pay | Admitting: *Deleted

## 2016-01-02 DIAGNOSIS — R600 Localized edema: Secondary | ICD-10-CM

## 2016-01-02 MED ORDER — FUROSEMIDE 40 MG PO TABS: 40.0000 mg | ORAL_TABLET | Freq: Every day | ORAL | Status: DC

## 2016-01-02 MED ORDER — POTASSIUM CHLORIDE CRYS ER 10 MEQ PO TBCR: 20.0000 meq | EXTENDED_RELEASE_TABLET | Freq: Every day | ORAL | Status: DC

## 2016-01-02 NOTE — Progress Notes (Signed)
Mrs. Grahl, mother of Mr. Sean [name] has called to relate that he has gained two  pounds in one day and he has ankle edema. She said that Dr. Roxy Manns said he would be on Lasix on discharge but she didn't get a prescription.  Lasix/K will be e-prescribed to their pharmacy and she agrees.  Proper elevation was also reinforced.

## 2016-01-03 ENCOUNTER — Telehealth: Payer: Self-pay | Admitting: *Deleted

## 2016-01-03 NOTE — Telephone Encounter (Signed)
Mr. Qamar aunt has called relating his new onset of gout.  He does have a history of gout and was treated by his PCP several years ago. I suggested he consult his PCP and she agrees.

## 2016-01-04 DIAGNOSIS — M109 Gout, unspecified: Secondary | ICD-10-CM | POA: Insufficient documentation

## 2016-01-04 DIAGNOSIS — N183 Chronic kidney disease, stage 3 unspecified: Secondary | ICD-10-CM | POA: Insufficient documentation

## 2016-01-04 DIAGNOSIS — I2510 Atherosclerotic heart disease of native coronary artery without angina pectoris: Secondary | ICD-10-CM | POA: Insufficient documentation

## 2016-01-04 DIAGNOSIS — N184 Chronic kidney disease, stage 4 (severe): Secondary | ICD-10-CM

## 2016-01-04 DIAGNOSIS — Z6841 Body Mass Index (BMI) 40.0 and over, adult: Secondary | ICD-10-CM

## 2016-01-04 HISTORY — DX: Morbid (severe) obesity due to excess calories: E66.01

## 2016-01-04 HISTORY — DX: Chronic kidney disease, stage 3 unspecified: N18.30

## 2016-01-04 HISTORY — DX: Chronic kidney disease, stage 4 (severe): N18.4

## 2016-01-05 NOTE — Progress Notes (Signed)
Cardiology Office Note   Date:  01/08/2016   ID:  Sean [name], DOB 23-Mar-1971, MRN [id number]  PCP:  [name], MD  Cardiologist:  Dr. Meda Coffee  Post hospital f/u     History of Present Illness: Sean [name] is a 45 y.o. 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  He was recently admitted to [facility] from 1/22-1/30/17. He presented to the ED for evaluation of chest pain and dyspnea. EKG showed sinus bradycardia without evidence of ischemic changes. Serial Troponin levels were minimally elevated. The patient continued to have chest pain alleviated by Nitroglycerin. He was admitted for further workup. He underwent cardiac catheterization on 12/24/2015 which revealed: 1. Severe 1v CAD with high-grade ostial LAD lesion (95%+) 2. 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 Elevated right sided pressures. Unable to complete RHC due to kinking of venous sheath  RA 15  RV 44/18 4. Probable normal LV function He had high-grade ostial LAD disease in setting of what appeared to be congenital heart disease with possible dextrocardia and significantly elevated R-sided pressures. With low platelets and elevated LFTs, it was suspected that he may have cardiac cirrhosis due to persistently elevated R-sighted pressures.  Although LAD lesion may have been approachable percutaneously, with low platelets, long-term DAPT was not felt to be best option.   Cardiac CT was obtained which showed: IMPRESSION: 1. Coronary calcium score of 327. This was 51 percentile for age and sex matched control. 2. Severe obstructive CAD in the ostial to proximal LAD with mixed plaque associated  with > 90% stenosis. Only mild non-obstructive plaque in LCX and RCA. 3. Anterior rotation of the cardiac apex, no obvious dextrocardia or other anomalies such as situs inversus. 4. Mild concentric LVH. 5. Dilated pulmonary artery suggestive of pulmonary hypertension. 6. Possible PFO.  It was felt coronary bypass grafting would be indicated and TCTS consult was obtained. He was evaluated by Dr. Roxy Manns on 12/25/2015 at which time he was in agreement the patient should have a single vessel bypass procedure.He was taken to the operating room on 12/26/2015. He underwent Off Pump CABG x 1 utilizing LIMA to LAD.His post op course was uncomplicated and he was discharged home on 12/31/15.  Today he presents to clinic for follow up. He has not been able to walk well due to a gout flare, but otherwise he has been doing well. He has been taking prednisone for this. No CP or SOB. He is going participate in cardiac rehab. No dizziness or passing out. No blood in stool or urine. Really doing pretty well, no complaints. Here with mother.      Past Medical History  Diagnosis Date  . Hypercholesteremia     a. Prev taken off statin due to abnormal liver function.  . Hypertension   . Morbid obesity ([place])   . Abnormal liver enzymes     a. Sees a doctor in [place].  . Thrombocytopenia ([place])   . Hematuria     a. Chronic hx of this, no prior etiology determined through workup per patient.  . Sinus bradycardia   . 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    LIMA to LAD    Past Surgical History  Procedure Laterality Date  .  Gallbladder surgery    . Appendectomy    . Hernia repair    . Cardiac catheterization N/A 12/24/2015    Procedure: Right/Left Heart Cath and Coronary Angiography;  Surgeon: Jolaine Artist, MD;  Location: [facility];  Service: Cardiovascular;  Laterality: N/A;  . Coronary artery bypass graft N/A 12/26/2015    Procedure: Off Pump Coronary Artery Bypass Grafting times one using left  internal mammary artery;  Surgeon: Rexene Alberts, MD;  Location: [facility];  Service: Open Heart Surgery;  Laterality: N/A;  . Tee without cardioversion N/A 12/26/2015    Procedure: TRANSESOPHAGEAL ECHOCARDIOGRAM (TEE);  Surgeon: Rexene Alberts, MD;  Location: [facility];  Service: Open Heart Surgery;  Laterality: N/A;     [name] Outpatient Prescriptions  Medication Sig Dispense Refill  . aspirin EC 325 MG EC tablet Take 1 tablet (325 mg total) by mouth daily. 30 tablet 0  . atenolol (TENORMIN) 25 MG tablet Take 0.5 tablets (12.5 mg total) by mouth 2 (two) times daily. 30 tablet 3  . cetirizine (ZYRTEC) 10 MG tablet Take 10 mg by mouth daily.    . furosemide (LASIX) 40 MG tablet Take 1 tablet (40 mg total) by mouth daily. 14 tablet 1  . omeprazole (PRILOSEC) 20 MG capsule Take 20 mg by mouth daily.    . potassium chloride (K-DUR,KLOR-CON) 10 MEQ tablet Take 2 tablets (20 mEq total) by mouth daily. 28 tablet 1  . predniSONE (DELTASONE) 10 MG tablet Use 5 for 2 days, then 4 for 2 days, then 3 for 2 days, then 1 for 2 days.    . rosuvastatin (CRESTOR) 40 MG tablet Take 1 tablet (40 mg total) by mouth daily at 6 PM. 30 tablet 3  . traMADol (ULTRAM) 50 MG tablet Take 1-2 tablets (50-100 mg total) by mouth every 4 (four) hours as needed for moderate pain. 30 tablet 0  . colchicine 0.6 MG tablet Take 2 tablets by mouth at one time then in 1 hour after, take 1 tablet by mouth 3 tablet 0   No [name] facility-administered medications for this visit.    Allergies:   Review of patient's allergies indicates no known allergies.    Social History:  The patient  reports that he has never smoked. He does not have any smokeless tobacco history on file. He reports that he drinks alcohol. He reports that he does not use illicit drugs.   Family History:  The patient'sfamily history includes CAD in his father.    ROS:  Please see the history of present illness.   Otherwise, review of systems are positive for  none.   All other systems are reviewed and negative.    PHYSICAL EXAM: VS:  BP 114/72 mmHg  Pulse 49  Ht 5\' 11"  (1.803 m)  Wt 331 lb 6.4 oz (150.322 kg)  BMI 46.24 kg/m2 , BMI Body mass index is 46.24 kg/(m^2). GEN: Well nourished, well developed, in no acute distressObese  HEENT: normal Neck: no JVD, carotid bruits, or masses Cardiac: RRR; no murmurs, rubs, or gallops,no edema  Respiratory:  clear to auscultation bilaterally, normal work of breathing GI: soft, nontender, nondistended, + BS MS: no deformity or atrophy Skin: warm and dry, no rash Neuro:  Strength and sensation are intact Psych: euthymic mood, full affect   EKG:  EKG is ordered today.    Recent Labs: 12/23/2015: TSH 3.776 12/24/2015: ALT 80* 12/27/2015: Magnesium 2.1 12/30/2015: BUN 29*; Creatinine, Ser 1.38*; Hemoglobin 11.1*; Platelets 102*; Potassium  3.9; Sodium 138    Lipid Panel    Component Value Date/Time   CHOL 183 12/24/2015 0622   TRIG 149 12/24/2015 0622   HDL 32* 12/24/2015 0622   CHOLHDL 5.7 12/24/2015 0622   VLDL 30 12/24/2015 0622   LDLCALC 121* 12/24/2015 0622      Wt Readings from Last 3 Encounters:  01/08/16 331 lb 6.4 oz (150.322 kg)  12/31/15 338 lb 12.8 oz (153.679 kg)      Other studies Reviewed: Additional studies/ records that were reviewed today include: LHC, cardiac CT, see HPI Review of the above records demonstrates:   12/24/15 Conclusion     Prox RCA lesion, 20% stenosed.  Mid RCA to Dist RCA lesion, 20% stenosed.  Ost LAD to Prox LAD lesion, 95% stenosed.  Mid LAD lesion, 40% stenosed.  Ost 1st Mrg to 1st Mrg lesion, 40% stenosed.  Assessment:  1. Severe 1v CAD with high-grade ostial LAD lesion (95%+) 2. 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 Elevated right sided pressures. Unable to complete RHC due to kinking of venous sheath  RA 15  RV 44/18 4. Probable normal LV  function   Plan/discussion:  He has high-grade ostial LAD disease in setting of what appears to be congenital heart disease with possible dextrocardia and significantly elevated R-sided pressures. 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 Case reviewed with Dr. Gwenlyn Found. Will plan echo and possible CT to further evaluate. Although LAD lesion may be approachable percutaneously with low platelets long-term DAPT may not be best option. Will request TCTS consult for further planning once imaging studies completed.         ASSESSMENT AND PLAN:  Sean [name] is a 45 y.o. 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  CAD s/p CABG x1V (12/2015): continue ASA 325mg  daily, atenolol 12.5mg  daily, and crestor 40mg  daily. He did have an ACS presentation but he would likely not be a good candidate for DAPT s/p CABG due to chronic thrombocytopenia  HLD: continue Crestor 40mg . Most recent LDL 121. Goal <70.   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 Baseline LFTs: AST 46 and 80. Will monitor this on a statin.   HTN: BP 114/72. Continue atenolol 12.5 mg daily ( cannot titrate further due to sinus brady)  CKD: baseline creat ~1.4  Morbid obesity: diet and exercise recommended  Gout: having significant pain due to gout.  Will add cochicine 1.2 mg 1 followed by 0.6 mg an hour later.  [name] medicines are reviewed at length with the patient today.  The patient does not have concerns regarding medicines.  The following changes have been made:  Add colchicine 1.2 mg 1 followed  by 0.6 mg an hour later.  Labs/ tests ordered today include:  No orders of the defined types were placed in this encounter.     Disposition:   FU with Dr. Meda Coffee in 3 month  Judy Pimple, PA-C  01/08/2016 1:36 PM    [facility] [place], [place], [place]  [address] Phone: [phone]; Fax: [phone]

## 2016-01-08 ENCOUNTER — Ambulatory Visit (INDEPENDENT_AMBULATORY_CARE_PROVIDER_SITE_OTHER): Payer: BLUE CROSS/BLUE SHIELD | Admitting: Physician Assistant

## 2016-01-08 ENCOUNTER — Encounter: Payer: Self-pay | Admitting: Physician Assistant

## 2016-01-08 ENCOUNTER — Encounter: Payer: Self-pay | Admitting: *Deleted

## 2016-01-08 VITALS — BP 114/72 | HR 49 | Ht 71.0 in | Wt 331.4 lb

## 2016-01-08 DIAGNOSIS — I2510 Atherosclerotic heart disease of native coronary artery without angina pectoris: Secondary | ICD-10-CM

## 2016-01-08 MED ORDER — COLCHICINE 0.6 MG PO TABS: ORAL_TABLET | ORAL | Status: DC

## 2016-01-08 NOTE — Patient Instructions (Addendum)
Medication Instructions:  Your physician has recommended you make the following change in your medication:  1.  TAKE TODAY:  Colchicine 0.6 mg tablets, take 2 tablets at one time then in 1 hour after, take 1 tablet    Labwork: None ordered  Testing/Procedures: None ordered  Follow-Up: Your physician recommends that you schedule a follow-up appointment in: [place]   Any Other Special Instructions Will Be Listed Below (If Applicable).   If you need a refill on your cardiac medications before your next appointment, please call your pharmacy.

## 2016-01-11 ENCOUNTER — Telehealth: Payer: Self-pay | Admitting: *Deleted

## 2016-01-11 NOTE — Telephone Encounter (Signed)
Sean [name] caregiver has called with concerns regarding his chest tube incisions.  She said there are strips still on some of them, but they look bad, "like cigarette burns".  I told her to remove the remaining strips, then cleanse the sites with peroxide three times a day and finally wash with soap and water.  Cover with a dressing if needed, but open to air as much as possible.  I reassured her that chest tube sites rarely become infected. She should call if there are still concerns.

## 2016-01-14 ENCOUNTER — Telehealth: Payer: Self-pay | Admitting: Physician Assistant

## 2016-01-14 MED ORDER — COLCHICINE 0.6 MG PO TABS: 0.6000 mg | ORAL_TABLET | Freq: Two times a day (BID) | ORAL | Status: DC

## 2016-01-14 MED ORDER — INDOMETHACIN 50 MG PO CAPS: 50.0000 mg | ORAL_CAPSULE | Freq: Three times a day (TID) | ORAL | Status: DC | PRN

## 2016-01-14 NOTE — Telephone Encounter (Signed)
Pt was seen in PA's clinic on 01/08/16 post CABG's.  During the visit pt C/O of having pain from  gout on his great toe . The PA prescribed Colchicine 0.6 mg  3 tablets were given, take 2 tablet the one hour later take the other tablet. Pt states that those 3 tablets helped for a total of 6 days . Now it flared up, he can't hardly walk now. Pt would like to know if he can take indomethacin 50 mg one capsule by mouth once daily for 20 days. This medication was prescribed by a PCP for gout due of him having abnormal LFT. Dr. Meda Coffee MD aware of pt's symptoms, she recommended for pt to take Colchicine 0.6 mg one tablet by mouth twice a day and 3 refills.pt to have Indomethacin 50 mg as needed. Prescriptions send to pt's pharmacy for Colchicine 0.6 mg twice a day,3 refills and Indomethacin 50 mg one tabled by mouth three times a day as needed for gout dispense 50 capsules and no refills. Pt is aware.

## 2016-01-14 NOTE — Telephone Encounter (Signed)
New message      Pt was recently seen by the NP.  She gave him medication for gout.  The medication helped but it is now all gone.  He has some indomethacin 50mg  that was prescribed about 4 yrs ago.  Can he take this?

## 2016-01-17 ENCOUNTER — Encounter (HOSPITAL_COMMUNITY): Payer: Self-pay | Admitting: Emergency Medicine

## 2016-01-17 ENCOUNTER — Ambulatory Visit (HOSPITAL_BASED_OUTPATIENT_CLINIC_OR_DEPARTMENT_OTHER)
Admit: 2016-01-17 | Discharge: 2016-01-17 | Disposition: A | Discharge status: Home / Self Care | Payer: BLUE CROSS/BLUE SHIELD | Attending: Emergency Medicine | Admitting: Emergency Medicine

## 2016-01-17 ENCOUNTER — Telehealth: Payer: Self-pay | Admitting: Cardiology

## 2016-01-17 ENCOUNTER — Emergency Department (HOSPITAL_COMMUNITY)
Admission: EM | Admit: 2016-01-17 | Discharge: 2016-01-17 | Disposition: A | Discharge status: Home / Self Care | Payer: BLUE CROSS/BLUE SHIELD | Attending: Emergency Medicine | Admitting: Emergency Medicine

## 2016-01-17 DIAGNOSIS — Y658 Other specified misadventures during surgical and medical care: Secondary | ICD-10-CM | POA: Insufficient documentation

## 2016-01-17 DIAGNOSIS — S60211S Contusion of right wrist, sequela: Secondary | ICD-10-CM | POA: Insufficient documentation

## 2016-01-17 DIAGNOSIS — Z951 Presence of aortocoronary bypass graft: Secondary | ICD-10-CM | POA: Insufficient documentation

## 2016-01-17 DIAGNOSIS — M79601 Pain in right arm: Secondary | ICD-10-CM | POA: Diagnosis not present

## 2016-01-17 DIAGNOSIS — E7800 Pure hypercholesterolemia, unspecified: Secondary | ICD-10-CM | POA: Diagnosis not present

## 2016-01-17 DIAGNOSIS — Z79899 Other long term (current) drug therapy: Secondary | ICD-10-CM | POA: Diagnosis not present

## 2016-01-17 DIAGNOSIS — Z7982 Long term (current) use of aspirin: Secondary | ICD-10-CM | POA: Insufficient documentation

## 2016-01-17 DIAGNOSIS — Z862 Personal history of diseases of the blood and blood-forming organs and certain disorders involving the immune mechanism: Secondary | ICD-10-CM | POA: Insufficient documentation

## 2016-01-17 DIAGNOSIS — I10 Essential (primary) hypertension: Secondary | ICD-10-CM | POA: Insufficient documentation

## 2016-01-17 DIAGNOSIS — R197 Diarrhea, unspecified: Secondary | ICD-10-CM | POA: Diagnosis not present

## 2016-01-17 DIAGNOSIS — Z4800 Encounter for change or removal of nonsurgical wound dressing: Secondary | ICD-10-CM | POA: Diagnosis present

## 2016-01-17 DIAGNOSIS — Z7952 Long term (current) use of systemic steroids: Secondary | ICD-10-CM | POA: Diagnosis not present

## 2016-01-17 DIAGNOSIS — M25531 Pain in right wrist: Secondary | ICD-10-CM

## 2016-01-17 MED ORDER — SULFAMETHOXAZOLE-TRIMETHOPRIM 800-160 MG PO TABS: 1.0000 | ORAL_TABLET | Freq: Two times a day (BID) | ORAL | Status: DC

## 2016-01-17 NOTE — ED Notes (Signed)
Pt here for some purulent discharge from right radial cath attempt site; pt sts had cath 12/26/2015; mild redness noted

## 2016-01-17 NOTE — Progress Notes (Signed)
Preliminary results by tech - Right Upper ext Arterial Duplex completed. No evidence of pseudo or AVF at the cath site. A small possible hematoma is noted. Oda Cogan, BS, RDMS, RVT

## 2016-01-17 NOTE — Telephone Encounter (Signed)
New Message  Pt called states that right in the center of where the CATH was completed is Black and Red around the perimeter and it feels hot to the touch. Pt also states that it is hard. Looks like a blood blister. Please call back to discuss.

## 2016-01-17 NOTE — Telephone Encounter (Signed)
Pt calling in with complaints of his wrist area hurting and appearing very red and swollen.  Pt states that he had a cardiac cath done and they attempted to go through his wrist, and this was unsuccessful.  Pt now states that his wrist is very red in appearance and very swollen, and streaking up his arm.  Pt states this is the wrist where they attempted to go through with his cath.  Pt states its very warm-to-touch as well.  Pt states its very painful as well.  Pt states he is afebrile.  Pt states that at his radial site, its extremely red with a black spot in the middle.  Pt states that it appears as if it "needs to be popped." Pt states he does have good circulation in that extremity.  Advised the pt to go to [facility] ER or Urgent care now, for further evaluation of possible infection/MRSA at his cath site, and to rule out a blood clot.  Informed the pt that if this is a abscess, then the MD at either location could appropriately lance and treat this.  Also informed the pt, he might require an antibiotic as well.  Advised the pt to not touch the site, and keep it clean and covered until he goes to the ER.  Pt voiced no other complaints from a cardiac standpoint.  Informed the pt that Dr Meda Coffee is currently out of the office today, but I will route this message to her as an fyi.  Pt verbalized understanding and agrees with this plan.  Pt states he prefers going to [facility] ER for further treatment vs Urgent Care.

## 2016-01-17 NOTE — Discharge Instructions (Signed)
Keep wound clean with mild soap and water. Keep area covered with a topical antibiotic ointment and bandage. Ice and elevate for additional pain relief and swelling. Alternate between Ibuprofen and Tylenol for additional pain relief. Take antibiotic as directed to cover for possible infection, but the spot on your arm seems more consistent with a hematoma which is a small collection of blood over a site where you had the catheterization. Follow up with your cardiologist in the next 1-2 days for wound recheck and ongoing management. Monitor area for signs of infection to include, but not limited to: increasing pain, spreading redness, drainage/pus, worsening swelling, or fevers. Return to emergency department for emergent changing or worsening symptoms.

## 2016-01-17 NOTE — ED Provider Notes (Signed)
CSN: [id number]     Arrival date & time 01/17/16  1402 History  By signing my name below, I, Sean [name], attest that this documentation has been prepared under the direction and in the presence of [address] [employer]. Electronically Signed: Eustaquio [name], ED Scribe. 01/17/2016. 2:41 PM.   Chief Complaint  Patient presents with  . Wound Check   Patient is a 45 y.o. male presenting with wound check. The history is provided by the patient and medical records. No language interpreter was used.  Wound Check This is a new problem. The current episode started 12 to 24 hours ago. The problem occurs rarely. The problem has not changed since onset.Pertinent negatives include no chest pain, no abdominal pain, no headaches and no shortness of breath. Nothing aggravates the symptoms. Nothing relieves the symptoms. He has tried nothing for the symptoms. The treatment provided no relief.     HPI Comments: Sean [name] is a 45 y.o. male with a PMHx of HLD, HTN, morbid obesity, thrombocytopenia, and sinus bradycardia, with a recent heart cath and CABG 3wks ago, who presents to the Emergency Department complaining of gradual onset, constant, increased redness to right wrist around right radial cath insertion site that began in the past 24 hours. Pt mentions that on 12/24/2015 (approximately 3 weeks ago) he had a cardiac catheterization done. The cardiologist attempted to insert the cath in the right radial forearm but was unsuccessful and performed a femoral cath instead. Pt states that since the procedure the insertion site on his forearm has been healing well with scabbing still present. In the past 24 hours, he has noticed increased redness with a black spot in the middle of the site which he thought was a scab. This morning he took a shower and noted the scab fell off, at which time he noted some blood drain. He's not sure if there was any purulent drainage with it, but he thinks there could have been. He  believes he may have hit the site with the towel, causing it to burst. Pt denies any pain or joint/arm swelling to the area. No recent injury to the area. He called his cardiologist's office this morning and was told to come to the ED for further evaluation and to r/o blood clot. Pt also mentions that he has been having watery stools the past couple of days that is not normal for him, but this stopped a few days ago without ongoing issues with his stool. Denies red streaking up right arm, warmth, swelling, fever, chills, chest pain, shortness of breath, abdominal pain, nausea, vomiting, constipation, dysuria, hematuria, numbness, tingling, weakness, or any other associated symptoms.   Past Medical History  Diagnosis Date  . Hypercholesteremia     a. Prev taken off statin due to abnormal liver function.  . Hypertension   . Morbid obesity ([place])   . Abnormal liver enzymes     a. Sees a doctor in [place].  . Thrombocytopenia ([place])   . Hematuria     a. Chronic hx of this, no prior etiology determined through workup per patient.  . Sinus bradycardia   . 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 Gallbladder surgery    . Appendectomy    . Hernia repair    . Cardiac catheterization N/A 12/24/2015    Procedure: Right/Left Heart Cath and Coronary Angiography;  Surgeon: Jolaine Artist, MD;  Location: [facility]  INVASIVE CV LAB;  Service: Cardiovascular;  Laterality: N/A;  . Coronary artery bypass graft N/A 12/26/2015    Procedure: Off Pump Coronary Artery Bypass Grafting times one using left internal mammary artery;  Surgeon: Rexene Alberts, MD;  Location: [facility];  Service: Open Heart Surgery;  Laterality: N/A;  . Tee without cardioversion N/A 12/26/2015    Procedure: TRANSESOPHAGEAL ECHOCARDIOGRAM (TEE);  Surgeon: Rexene Alberts, MD;  Location: [facility];  Service: Open Heart Surgery;  Laterality: N/A;   Family History  Problem Relation Age of  Onset  . Heart attack    . Hyperlipidemia    . CAD Father     2 stents ~ 85   Social History  Substance Use Topics  . Smoking status: Never Smoker   . Smokeless tobacco: None  . Alcohol Use: 0.0 oz/week    0 Standard drinks or equivalent per week     Comment: Once a month    Review of Systems  Constitutional: Negative for fever and chills.  Respiratory: Negative for shortness of breath.   Cardiovascular: Negative for chest pain.  Gastrointestinal: Positive for diarrhea (2-3 days ago, none since). Negative for nausea, vomiting, abdominal pain and constipation.  Genitourinary: Negative for dysuria and hematuria.  Musculoskeletal: Negative for myalgias, joint swelling and arthralgias.  Skin: Positive for color change (right wrist) and wound.       + right wrist redness and mild drainage; Negative for pain  Allergic/Immunologic: Negative for immunocompromised state.  Neurological: Negative for weakness, numbness and headaches.  10 Systems reviewed and all are negative for acute change except as noted in the HPI.   Allergies  Review of patient's allergies indicates no known allergies.  Home Medications   Prior to Admission medications   Medication Sig Start Date End Date Taking? Authorizing Provider  aspirin EC 325 MG EC tablet Take 1 tablet (325 mg total) by mouth daily. 12/31/15   [name] R Barrett, PA-C  atenolol (TENORMIN) 25 MG tablet Take 0.5 tablets (12.5 mg total) by mouth 2 (two) times daily. 12/31/15   [name] R Barrett, PA-C  cetirizine (ZYRTEC) 10 MG tablet Take 10 mg by mouth daily.    Historical Provider, MD  colchicine 0.6 MG tablet Take 1 tablet (0.6 mg total) by mouth 2 (two) times daily. 01/14/16   Dorothy Spark, MD  furosemide (LASIX) 40 MG tablet Take 1 tablet (40 mg total) by mouth daily. 01/02/16   Rexene Alberts, MD  indomethacin (INDOCIN) 50 MG capsule Take 1 capsule (50 mg total) by mouth 3 (three) times daily as needed. 01/14/16   Dorothy Spark, MD    omeprazole (PRILOSEC) 20 MG capsule Take 20 mg by mouth daily.    Historical Provider, MD  potassium chloride (K-DUR,KLOR-CON) 10 MEQ tablet Take 2 tablets (20 mEq total) by mouth daily. 01/02/16   Rexene Alberts, MD  predniSONE (DELTASONE) 10 MG tablet Use 5 for 2 days, then 4 for 2 days, then 3 for 2 days, then 1 for 2 days. 01/04/16   Historical Provider, MD  rosuvastatin (CRESTOR) 40 MG tablet Take 1 tablet (40 mg total) by mouth daily at 6 PM. 12/31/15   [name] R Barrett, PA-C  traMADol (ULTRAM) 50 MG tablet Take 1-2 tablets (50-100 mg total) by mouth every 4 (four) hours as needed for moderate pain. 12/31/15   [name] R Barrett, PA-C   BP 112/80 mmHg  Pulse 51  Temp(Src) 97.7 F (36.5 C) (Oral)  Resp 18  SpO2 98%   Physical Exam  Constitutional: He is oriented to person, place, and time. Vital signs are normal. He appears well-developed and well-nourished.  Non-toxic appearance. No distress.  Afebrile, nontoxic, NAD  HENT:  Head: Normocephalic and atraumatic.  Mouth/Throat: Mucous membranes are normal.  Eyes: Conjunctivae and EOM are normal. Right eye exhibits no discharge. Left eye exhibits no discharge.  Neck: Normal range of motion. Neck supple.  Cardiovascular: Normal rate and intact distal pulses.   Pulmonary/Chest: Effort normal. No respiratory distress.  Abdominal: Normal appearance. He exhibits no distension.  Musculoskeletal: Normal range of motion.  Right wrist along the volar aspect over the radial artery with a 5 mm area of erythema surrounding the puncture wound site, no warmth or red streaking, no induration or fluctuance, no tenderness, and no swelling of the upper extremity. FROM intact in all digits and at wrist. Strength and sensation grossly intact in all extremities, distal pulses intact, compartments soft. SEE PICTURE BELOW.  Neurological: He is alert and oriented to person, place, and time. He has normal strength. No sensory deficit.  Skin: Skin is warm, dry and intact.  No rash noted. There is erythema.  See MSK exam  Psychiatric: He has a normal mood and affect.  Nursing note and vitals reviewed.     ED Course  Procedures (including critical care time)  DIAGNOSTIC STUDIES: Oxygen Saturation is 98% on RA, normal by my interpretation.    COORDINATION OF CARE: 2:34 PM-Discussed treatment plan which includes Vascular US Upper extremity with pt at bedside and pt agreed to plan.   Labs Review Labs Reviewed - No data to display  Imaging Review No results found.  Progress Notes by Bonnye Fava Sturdivant at 01/17/2016 4:06 PM    Author: Bonnye Fava Sturdivant Service: Vascular Lab Author Type: Cardiovascular Sonographer   Filed: 01/17/2016 4:08 PM Note Time: 01/17/2016 4:06 PM Status: Signed   Editor: Bonnye Fava Sturdivant (Cardiovascular Sonographer)      Preliminary results by tech - Right Upper ext Arterial Duplex completed. No evidence of pseudo or AVF at the cath site. A small possible hematoma is noted. Oda Cogan, BS, RDMS, RVT      I have personally reviewed and evaluated these images as part of my medical decision-making.   EKG Interpretation None      MDM   Final diagnoses:  Traumatic hematoma of right wrist, sequela    45 y.o. male here with R wrist redness over the site of an attempted cath 3wks ago, states it was a scab to begin with and that scab came off today and drained some blood with possible purulent material but he's not sure. No pain or tenderness. He was sent to r/o abscess and blood clot after calling his cardiologist and the nurse told him to come here. On exam, NVI with soft compartments, no swelling, minimal erythema around the hematoma of the cath site, no warmth, no fluctuance or induration, no tenderness. Doubt blood clot, doubt abscess, but will obtain U/S to r/o possibility of clot especially given the recent surgery/cath. Doubt that this area needs to be drained, since it doesn't seem to be an abscess, but u/s can  further delineate this as well. Will reassess shortly  4:14 PM U/S finished and reports what I suspected, which is a small hematoma. Given the erythema, will cover for possible infection, but doubt abscess or need for I&D especially since the U/S tech stated there is no focal fluid collection to drain. Will start  on bactrim x5 days. F/up with cardiologist tomorrow for ongoing management of the cath wound site. I explained the diagnosis and have given explicit precautions to return to the ER including for any other new or worsening symptoms. The patient understands and accepts the medical plan as it's been dictated and I have answered their questions. Discharge instructions concerning home care and prescriptions have been given. The patient is STABLE and is discharged to home in good condition.   I personally performed the services described in this documentation, which was scribed in my presence. The recorded information has been reviewed and is accurate.    BP 112/80 mmHg  Pulse 51  Temp(Src) 97.7 F (36.5 C) (Oral)  Resp 18  SpO2 98%  Meds ordered this encounter  Medications  . sulfamethoxazole-trimethoprim (BACTRIM DS,SEPTRA DS) 800-160 MG tablet    Sig: Take 1 tablet by mouth 2 (two) times daily. x5 days    Dispense:  10 tablet    Refill:  0    Order Specific Question:  Supervising Provider    Answer:  Noemi Chapel [[id number]]      Glenola Wheat Camprubi-Soms, PA-C 01/17/16 [place], MD 01/20/16 [phone]

## 2016-01-17 NOTE — ED Notes (Signed)
Per PA request - changed acuity to 3, will need venous duplex.

## 2016-01-18 ENCOUNTER — Other Ambulatory Visit: Payer: Self-pay | Admitting: *Deleted

## 2016-01-18 DIAGNOSIS — G8918 Other acute postprocedural pain: Secondary | ICD-10-CM

## 2016-01-18 MED ORDER — TRAMADOL HCL 50 MG PO TABS: 50.0000 mg | ORAL_TABLET | ORAL | Status: DC | PRN

## 2016-01-18 NOTE — Telephone Encounter (Signed)
Mrs. Minnick has called requesting a refill for Tramadal for Sean [name] s/p CABG.  I informed her that a script would be faxed to his pharmacy today and she understood.

## 2016-01-25 ENCOUNTER — Other Ambulatory Visit: Payer: Self-pay | Admitting: Thoracic Surgery (Cardiothoracic Vascular Surgery)

## 2016-01-25 DIAGNOSIS — Z951 Presence of aortocoronary bypass graft: Secondary | ICD-10-CM

## 2016-01-28 ENCOUNTER — Ambulatory Visit
Admission: RE | Admit: 2016-01-28 | Discharge: 2016-01-28 | Disposition: A | Discharge status: Home / Self Care | Payer: BLUE CROSS/BLUE SHIELD | Source: Ambulatory Visit | Attending: Thoracic Surgery (Cardiothoracic Vascular Surgery) | Admitting: Thoracic Surgery (Cardiothoracic Vascular Surgery)

## 2016-01-28 ENCOUNTER — Encounter: Payer: Self-pay | Admitting: Thoracic Surgery (Cardiothoracic Vascular Surgery)

## 2016-01-28 ENCOUNTER — Ambulatory Visit (INDEPENDENT_AMBULATORY_CARE_PROVIDER_SITE_OTHER): Payer: Self-pay | Admitting: Thoracic Surgery (Cardiothoracic Vascular Surgery)

## 2016-01-28 VITALS — BP 100/63 | HR 48 | Resp 20 | Ht 71.0 in | Wt 324.0 lb

## 2016-01-28 DIAGNOSIS — Z951 Presence of aortocoronary bypass graft: Secondary | ICD-10-CM

## 2016-01-28 NOTE — Progress Notes (Signed)
[address]       [place] [address]             [phone]     CARDIOTHORACIC SURGERY OFFICE NOTE  Referring Provider is Bensimhon, Shaune Pascal, MD  Primary Cardiologist is CAMNITZ, Ocie Doyne, MD (new) PCP is [name], MD   HPI:  Patient is a 45 year old morbidly obese male who returns to the office for follow-up today status post off pump coronary artery bypass grafting 1 using left internal mammary artery to the distal left anterior descending coronary artery for critical single vessel coronary artery disease with unstable angina on 12/26/2015. The patient's postoperative recovery was uncomplicated and he was discharged from the hospital on the fifth postoperative day. Soon after hospital discharge the patient developed severe pain and erythema in his left foot consistent with an acute gout flare. He was initially seen by his primary care physician who prescribed prednisone. This did not help.  The patient was seen in follow-up by Angelena Form at [facility] on 01/08/2016. She prescribed colchicine but only gave the patient a total of 3 pills. The patient states that symptom relief following cochicine was miraculous but symptoms recurred within a few days.  More recently the patient has been given a prescription for indomethacin and Zyloprim. The patient did not fill the prescription for Zyloprim due to excessive cost. He returns to our office today for routine follow-up. He states that other than pain in his foot related to gout he feels remarkably well. He has minimal residual soreness in his chest. He denies any shortness of breath. He has been walking as much as he can, although he has been limited by the pain in his left foot. He already resume driving an automobile and he is interested in participating in outpatient cardiac rehabilitation program.   Current Outpatient Prescriptions  Medication Sig Dispense Refill  . aspirin EC 325 MG EC tablet Take 1  tablet (325 mg total) by mouth daily. 30 tablet 0  . atenolol (TENORMIN) 25 MG tablet Take 0.5 tablets (12.5 mg total) by mouth 2 (two) times daily. 30 tablet 3  . cetirizine (ZYRTEC) 10 MG tablet Take 10 mg by mouth daily.    . furosemide (LASIX) 40 MG tablet Take 1 tablet (40 mg total) by mouth daily. 14 tablet 1  . indomethacin (INDOCIN) 50 MG capsule Take 1 capsule (50 mg total) by mouth 3 (three) times daily as needed. 50 capsule 0  . omeprazole (PRILOSEC) 20 MG capsule Take 20 mg by mouth daily.    . potassium chloride (K-DUR,KLOR-CON) 10 MEQ tablet Take 2 tablets (20 mEq total) by mouth daily. 28 tablet 1  . rosuvastatin (CRESTOR) 40 MG tablet Take 1 tablet (40 mg total) by mouth daily at 6 PM. 30 tablet 3  . traMADol (ULTRAM) 50 MG tablet Take 1-2 tablets (50-100 mg total) by mouth every 4 (four) hours as needed for moderate pain. 30 tablet 0   No current facility-administered medications for this visit.      Physical Exam:   BP 100/63 mmHg  Pulse 48  Resp 20  Ht 5\' 11"  (1.803 m)  Wt 324 lb (146.965 kg)  BMI 45.21 kg/m2  SpO2 98%  General:  Obese but well appearing  Chest:   Clear to auscultation  CV:   Regular rate and rhythm without murmur  Incisions:  Clean and dry healing nicely, sternum is stable  Abdomen:  Soft and nontender  Extremities:  Warm and well-perfused, dorsum of left foot is mildly erythematous and tender  Diagnostic Tests:  CHEST 2 VIEW  COMPARISON: Chest x-ray 12/29/2015.  FINDINGS: Chronic elevation of left hemidiaphragm. No acute consolidative airspace disease. No pleural effusions. No evidence of pulmonary edema. Cardiomegaly. Mild rightward shift of cardiac structures secondary to chronically elevated left hemidiaphragm. Upper mediastinal contours are within normal limits. Status post median sternotomy.  IMPRESSION: 1. No radiographic evidence of acute cardiopulmonary disease. 2. Chronic elevation of left  hemidiaphragm.   Electronically Signed  By: Vinnie Langton M.D.  On: 01/28/2016 14:18   Impression:  Patient is doing very well from a cardiac standpoint a proximally 1 month status post off-pump coronary artery bypass grafting 1 for critical single-vessel coronary artery disease involving the proximal left anterior descending coronary artery. The patient seems to be getting along quite well with his only significant problem being that of incomplete treatment of his recent gout flare.  Plan:  I have suggested that the patient should follow-up again with his primary care physician. I would recommend continuing colchicine until his gout flare has resolved, but this will require close follow-up because of his underlying chronic kidney disease. I would favor avoiding steroids if possible because of concerns regarding wound healing given his recent surgery.  Under the circumstances we will defer medical treatment for gout to the patient's primary care physician.  The patient has otherwise been encouraged to continue to gradually increase his physical activity as tolerated with his primary limitation remaining that he refrain from heavy lifting or strenuous use of his arms or shoulders for at least another 2 months. I think he is ready to begin participation in outpatient cardiac rehabilitation program once his gout flare has been taking care of. All of his questions have been addressed. The patient will return in 2 months to make sure that he continues to recover uneventfully.    Valentina Gu. Roxy Manns, MD 01/28/2016 3:02 PM

## 2016-01-28 NOTE — Patient Instructions (Signed)
Continue all previous medications without any changes at this time.  Continue to avoid any heavy lifting or strenuous use of your arms or shoulders for at least a total of three months from the time of surgery.  After three months you may gradually increase how much you lift or otherwise use your arms or chest as tolerated, with limits based upon whether or not activities lead to the return of significant discomfort.  You may return to driving an automobile as long as you are no longer requiring oral narcotic pain relievers during the daytime.  It would be wise to start driving only short distances during the daylight and gradually increase from there as you feel comfortable.  The patient is encouraged to enroll and participate in the outpatient cardiac rehab program beginning as soon as practical.

## 2016-02-11 ENCOUNTER — Other Ambulatory Visit: Payer: Self-pay | Admitting: Thoracic Surgery (Cardiothoracic Vascular Surgery)

## 2016-02-11 ENCOUNTER — Telehealth: Payer: Self-pay | Admitting: Physician Assistant

## 2016-02-11 ENCOUNTER — Other Ambulatory Visit: Payer: Self-pay | Admitting: Physician Assistant

## 2016-02-11 ENCOUNTER — Other Ambulatory Visit: Payer: Self-pay

## 2016-02-11 ENCOUNTER — Encounter (HOSPITAL_COMMUNITY): Payer: Self-pay | Admitting: Emergency Medicine

## 2016-02-11 ENCOUNTER — Telehealth: Payer: Self-pay | Admitting: *Deleted

## 2016-02-11 ENCOUNTER — Inpatient Hospital Stay (HOSPITAL_COMMUNITY)
Admission: EM | Admit: 2016-02-11 | Discharge: 2016-02-17 | DRG: 309 | Disposition: A | Discharge status: Home / Self Care | Payer: BLUE CROSS/BLUE SHIELD | Attending: Cardiology | Admitting: Cardiology

## 2016-02-11 ENCOUNTER — Emergency Department (HOSPITAL_COMMUNITY): Payer: BLUE CROSS/BLUE SHIELD

## 2016-02-11 ENCOUNTER — Telehealth: Payer: Self-pay | Admitting: Internal Medicine

## 2016-02-11 DIAGNOSIS — R739 Hyperglycemia, unspecified: Secondary | ICD-10-CM | POA: Diagnosis present

## 2016-02-11 DIAGNOSIS — I4892 Unspecified atrial flutter: Principal | ICD-10-CM | POA: Diagnosis present

## 2016-02-11 DIAGNOSIS — I313 Pericardial effusion (noninflammatory): Secondary | ICD-10-CM | POA: Diagnosis present

## 2016-02-11 DIAGNOSIS — Z6841 Body Mass Index (BMI) 40.0 and over, adult: Secondary | ICD-10-CM

## 2016-02-11 DIAGNOSIS — I2510 Atherosclerotic heart disease of native coronary artery without angina pectoris: Secondary | ICD-10-CM | POA: Diagnosis present

## 2016-02-11 DIAGNOSIS — R319 Hematuria, unspecified: Secondary | ICD-10-CM | POA: Diagnosis present

## 2016-02-11 DIAGNOSIS — R0902 Hypoxemia: Secondary | ICD-10-CM | POA: Diagnosis not present

## 2016-02-11 DIAGNOSIS — N183 Chronic kidney disease, stage 3 (moderate): Secondary | ICD-10-CM | POA: Diagnosis present

## 2016-02-11 DIAGNOSIS — E7800 Pure hypercholesterolemia, unspecified: Secondary | ICD-10-CM | POA: Diagnosis present

## 2016-02-11 DIAGNOSIS — E785 Hyperlipidemia, unspecified: Secondary | ICD-10-CM | POA: Diagnosis present

## 2016-02-11 DIAGNOSIS — D696 Thrombocytopenia, unspecified: Secondary | ICD-10-CM | POA: Diagnosis present

## 2016-02-11 DIAGNOSIS — I129 Hypertensive chronic kidney disease with stage 1 through stage 4 chronic kidney disease, or unspecified chronic kidney disease: Secondary | ICD-10-CM | POA: Diagnosis present

## 2016-02-11 DIAGNOSIS — I10 Essential (primary) hypertension: Secondary | ICD-10-CM | POA: Diagnosis present

## 2016-02-11 DIAGNOSIS — I959 Hypotension, unspecified: Secondary | ICD-10-CM | POA: Diagnosis present

## 2016-02-11 DIAGNOSIS — I429 Cardiomyopathy, unspecified: Secondary | ICD-10-CM | POA: Diagnosis present

## 2016-02-11 DIAGNOSIS — Z951 Presence of aortocoronary bypass graft: Secondary | ICD-10-CM

## 2016-02-11 DIAGNOSIS — M109 Gout, unspecified: Secondary | ICD-10-CM | POA: Diagnosis present

## 2016-02-11 DIAGNOSIS — G4733 Obstructive sleep apnea (adult) (pediatric): Secondary | ICD-10-CM | POA: Diagnosis present

## 2016-02-11 DIAGNOSIS — Z955 Presence of coronary angioplasty implant and graft: Secondary | ICD-10-CM

## 2016-02-11 DIAGNOSIS — Z79899 Other long term (current) drug therapy: Secondary | ICD-10-CM

## 2016-02-11 DIAGNOSIS — I4891 Unspecified atrial fibrillation: Secondary | ICD-10-CM | POA: Diagnosis present

## 2016-02-11 DIAGNOSIS — R51 Headache: Secondary | ICD-10-CM | POA: Diagnosis not present

## 2016-02-11 DIAGNOSIS — R600 Localized edema: Secondary | ICD-10-CM

## 2016-02-11 DIAGNOSIS — Z7982 Long term (current) use of aspirin: Secondary | ICD-10-CM

## 2016-02-11 DIAGNOSIS — Z8249 Family history of ischemic heart disease and other diseases of the circulatory system: Secondary | ICD-10-CM

## 2016-02-11 HISTORY — DX: Atherosclerotic heart disease of native coronary artery without angina pectoris: I25.10

## 2016-02-11 LAB — CBC WITH DIFFERENTIAL/PLATELET
BASOS ABS: 0.1 10*3/uL (ref 0.0–0.1)
Basophils Relative: 0 %
EOS ABS: 0.8 10*3/uL — AB (ref 0.0–0.7)
Eosinophils Relative: 7 %
HCT: 39.7 % (ref 39.0–52.0)
HEMOGLOBIN: 12.3 g/dL — AB (ref 13.0–17.0)
LYMPHS ABS: 2.5 10*3/uL (ref 0.7–4.0)
LYMPHS PCT: 21 %
MCH: 29.2 pg (ref 26.0–34.0)
MCHC: 31 g/dL (ref 30.0–36.0)
MCV: 94.3 fL (ref 78.0–100.0)
Monocytes Absolute: 1.7 10*3/uL — ABNORMAL HIGH (ref 0.1–1.0)
Monocytes Relative: 14 %
NEUTROS PCT: 58 %
Neutro Abs: 6.9 10*3/uL (ref 1.7–7.7)
PLATELETS: 108 10*3/uL — AB (ref 150–400)
RBC: 4.21 MIL/uL — AB (ref 4.22–5.81)
RDW: 14.8 % (ref 11.5–15.5)
WBC: 11.9 10*3/uL — AB (ref 4.0–10.5)

## 2016-02-11 LAB — COMPREHENSIVE METABOLIC PANEL
ALBUMIN: 2.8 g/dL — AB (ref 3.5–5.0)
ALT: 30 U/L (ref 17–63)
ANION GAP: 9 (ref 5–15)
AST: 29 U/L (ref 15–41)
Alkaline Phosphatase: 269 U/L — ABNORMAL HIGH (ref 38–126)
BUN: 17 mg/dL (ref 6–20)
CHLORIDE: 107 mmol/L (ref 101–111)
CO2: 26 mmol/L (ref 22–32)
Calcium: 8.8 mg/dL — ABNORMAL LOW (ref 8.9–10.3)
Creatinine, Ser: 1.42 mg/dL — ABNORMAL HIGH (ref 0.61–1.24)
GFR calc Af Amer: >60 mL/min (ref >60)
GFR calc non Af Amer: 59 mL/min — ABNORMAL LOW (ref >60)
GLUCOSE: 121 mg/dL — AB (ref 65–99)
POTASSIUM: 4.2 mmol/L (ref 3.5–5.1)
SODIUM: 142 mmol/L (ref 135–145)
TOTAL PROTEIN: 6.8 g/dL (ref 6.5–8.1)
Total Bilirubin: 0.7 mg/dL (ref 0.3–1.2)

## 2016-02-11 LAB — I-STAT TROPONIN, ED: Troponin i, poc: 0 ng/mL (ref 0.00–0.08)

## 2016-02-11 LAB — PROTIME-INR
INR: 1.04 (ref 0.00–1.49)
PROTHROMBIN TIME: 13.8 s (ref 11.6–15.2)

## 2016-02-11 LAB — TROPONIN I: TROPONIN I: 0.03 ng/mL (ref <0.031)

## 2016-02-11 LAB — MAGNESIUM: MAGNESIUM: 1.8 mg/dL (ref 1.7–2.4)

## 2016-02-11 MED ORDER — METOPROLOL TARTRATE 1 MG/ML IV SOLN
5.0000 mg | Freq: Once | INTRAVENOUS | Status: AC
  Administered 2016-02-11: 5 mg via INTRAVENOUS
  Filled 2016-02-11: qty 5

## 2016-02-11 MED ORDER — DILTIAZEM LOAD VIA INFUSION
20.0000 mg | Freq: Once | INTRAVENOUS | Status: AC
  Administered 2016-02-11: 20 mg via INTRAVENOUS
  Filled 2016-02-11: qty 20

## 2016-02-11 MED ORDER — FUROSEMIDE 40 MG PO TABS: 40.0000 mg | ORAL_TABLET | Freq: Every day | ORAL | Status: DC

## 2016-02-11 MED ORDER — DEXTROSE 5 % IV SOLN
5.0000 mg/h | INTRAVENOUS | Status: DC
  Administered 2016-02-11: 5 mg/h via INTRAVENOUS
  Filled 2016-02-11: qty 100

## 2016-02-11 MED ORDER — SODIUM CHLORIDE 0.9 % IV BOLUS (SEPSIS)
1000.0000 mL | Freq: Once | INTRAVENOUS | Status: AC
  Administered 2016-02-11: 1000 mL via INTRAVENOUS

## 2016-02-11 MED ORDER — POTASSIUM CHLORIDE CRYS ER 10 MEQ PO TBCR: 20.0000 meq | EXTENDED_RELEASE_TABLET | Freq: Every day | ORAL | Status: DC

## 2016-02-11 NOTE — Telephone Encounter (Addendum)
Mother calling stating that her son had a CABG in January.  Advised we did not have a DPR on file to speak with her regarding her sons's health.  He is present so she handed the phone to him.  He states that over the weekend he has been coughing, chest congested and having sinus drainage. Denies having a fever.  States he did take a nasal decongestant yesterday morning and last night.  Today his HR is 135. BP is normal.  Advised he should not take any medication that has "DM" and that he should call his PCP or go to urgent care due to sinus drainage.  He verbalizes understanding and will call his PCP.

## 2016-02-11 NOTE — Telephone Encounter (Signed)
Pt heart is 135 and his BP is 90/72. Please call asap to advise. Did not need this encounter.

## 2016-02-11 NOTE — ED Provider Notes (Signed)
CSN: [id number]     Arrival date & time 02/11/16  2157 History   By signing my name below, I, Rowan Blase, attest that this documentation has been prepared under the direction and in the presence of Everlene Balls, MD . Electronically Signed: Rowan Blase, Scribe. 02/11/2016. 11:27 PM.   Chief Complaint  Patient presents with  . Tachycardia  . Palpitations   The history is provided by the patient. No language interpreter was used.   HPI Comments:  JAHRON BIXLER is a 45 y.o. male with PMHx of HTN, CAD and CABG who presents to the Emergency Department via EMS complaining of constant heart palpitations onset this morning while sitting. Pt reports associated fatigue, "knot in his chest", congestion, sore throat, and persistent dry cough. He also notes his allergies bothered him after being outside on Thursday. He has taken Benydryl with mild relief. Pt notes his BP is usually around 115/72 and his heart rate rarely goes above 60. He also reports CABG on Dec 26 2015.  Past Medical History  Diagnosis Date  . Hypercholesteremia     a. Prev taken off statin due to abnormal liver function.  . Hypertension   . Morbid obesity ([place])   . Abnormal liver enzymes     a. Sees a doctor in [place].  . Thrombocytopenia ([place])   . Hematuria     a. Chronic hx of this, no prior etiology determined through workup per patient.  . Sinus bradycardia   . 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 Coronary artery disease    Past Surgical History  Procedure Laterality Date  . Gallbladder surgery    . Appendectomy    . Hernia repair    . Cardiac catheterization N/A 12/24/2015    Procedure: Right/Left Heart Cath and Coronary Angiography;  Surgeon: Jolaine Artist, MD;  Location: [facility];  Service: Cardiovascular;  Laterality: N/A;  . Coronary artery bypass graft N/A 12/26/2015    Procedure: Off Pump Coronary Artery Bypass Grafting times one using left internal mammary artery;   Surgeon: Rexene Alberts, MD;  Location: [facility];  Service: Open Heart Surgery;  Laterality: N/A;  . Tee without cardioversion N/A 12/26/2015    Procedure: TRANSESOPHAGEAL ECHOCARDIOGRAM (TEE);  Surgeon: Rexene Alberts, MD;  Location: [facility];  Service: Open Heart Surgery;  Laterality: N/A;   Family History  Problem Relation Age of Onset  . Heart attack    . Hyperlipidemia    . CAD Father     2 stents ~ 37   Social History  Substance Use Topics  . Smoking status: Never Smoker   . Smokeless tobacco: None  . Alcohol Use: Yes    Review of Systems  Constitutional: Positive for fatigue.  HENT: Positive for congestion and sore throat.   Respiratory: Positive for cough.   Cardiovascular: Positive for palpitations.  All other systems reviewed and are negative.   Allergies  Review of patient's allergies indicates no known allergies.  Home Medications   Prior to Admission medications   Medication Sig Start Date End Date Taking? Authorizing Provider  aspirin 325 MG tablet Take 325 mg by mouth daily.   Yes Historical Provider, MD  atenolol (TENORMIN) 25 MG tablet Take 0.5 tablets (12.5 mg total) by mouth 2 (two) times daily. Patient taking differently: Take 25 mg by mouth daily.  12/31/15  Yes [name] R Barrett, PA-C  cetirizine (ZYRTEC) 10 MG tablet Take 10 mg  by mouth daily.   Yes Historical Provider, MD  furosemide (LASIX) 40 MG tablet Take 1 tablet (40 mg total) by mouth daily. 02/11/16  Yes Eileen Stanford, PA-C  indomethacin (INDOCIN) 50 MG capsule Take 1 capsule (50 mg total) by mouth 3 (three) times daily as needed. 01/14/16  Yes Dorothy Spark, MD  omeprazole (PRILOSEC) 20 MG capsule Take 20 mg by mouth daily.   Yes Historical Provider, MD  phenylephrine (SUDAFED PE) 10 MG TABS tablet Take 10 mg by mouth every 4 (four) hours as needed (for congestion).   Yes Historical Provider, MD  potassium chloride (K-DUR,KLOR-CON) 10 MEQ tablet Take 2 tablets (20 mEq total) by mouth daily.  02/11/16  Yes Eileen Stanford, PA-C  rosuvastatin (CRESTOR) 40 MG tablet Take 1 tablet (40 mg total) by mouth daily at 6 PM. 12/31/15  Yes [name] R Barrett, PA-C  traMADol (ULTRAM) 50 MG tablet Take 1-2 tablets (50-100 mg total) by mouth every 4 (four) hours as needed for moderate pain. 01/18/16  Yes Rexene Alberts, MD  aspirin EC 325 MG EC tablet Take 1 tablet (325 mg total) by mouth daily. Patient not taking: Reported on 02/11/2016 12/31/15   [name] R Barrett, PA-C   BP 106/68 mmHg  Pulse 142  Temp(Src) 98.2 F (36.8 C) (Oral)  Resp 29  Ht 5\' 11"  (1.803 m)  Wt 325 lb 1 oz (147.447 kg)  BMI 45.36 kg/m2  SpO2 98% Physical Exam  Constitutional: He is oriented to person, place, and time. Vital signs are normal. He appears well-developed and well-nourished.  Non-toxic appearance. He does not appear ill. No distress.  Obese male  HENT:  Head: Normocephalic and atraumatic.  Nose: Nose normal.  Mouth/Throat: Oropharynx is clear and moist. No oropharyngeal exudate.  Eyes: Conjunctivae and EOM are normal. Pupils are equal, round, and reactive to light. No scleral icterus.  Neck: Normal range of motion. Neck supple. No tracheal deviation, no edema, no erythema and normal range of motion present. No thyroid mass and no thyromegaly present.  Cardiovascular: Regular rhythm, S1 normal, S2 normal, normal heart sounds, intact distal pulses and normal pulses.  Tachycardia present.  Exam reveals no gallop and no friction rub.   No murmur heard. Pulmonary/Chest: Effort normal and breath sounds normal. No respiratory distress. He has no wheezes. He has no rhonchi. He has no rales.  Abdominal: Soft. Normal appearance and bowel sounds are normal. He exhibits no distension, no ascites and no mass. There is no hepatosplenomegaly. There is no tenderness. There is no rebound, no guarding and no CVA tenderness.  Musculoskeletal: Normal range of motion. He exhibits no edema or tenderness.  Lymphadenopathy:    He has  no cervical adenopathy.  Neurological: He is alert and oriented to person, place, and time. He has normal strength. No cranial nerve deficit or sensory deficit.  Skin: Skin is warm, dry and intact. No petechiae and no rash noted. He is not diaphoretic. No erythema. No pallor.  Psychiatric: He has a normal mood and affect. His behavior is normal. Judgment normal.  Nursing note and vitals reviewed.  ED Course  Procedures  DIAGNOSTIC STUDIES:  Oxygen Saturation is 95% on RA, adequate by my interpretation.    COORDINATION OF CARE:  11:07 PM Discussed treatment plan with pt at bedside and pt agreed to plan.  Labs Review Labs Reviewed  CBC WITH DIFFERENTIAL/PLATELET - Abnormal; Notable for the following:    WBC 11.9 (*)    RBC 4.21 (*)  Hemoglobin 12.3 (*)    Platelets 108 (*)    Monocytes Absolute 1.7 (*)    Eosinophils Absolute 0.8 (*)    All other components within normal limits  COMPREHENSIVE METABOLIC PANEL - Abnormal; Notable for the following:    Glucose, Bld 121 (*)    Creatinine, Ser 1.42 (*)    Calcium 8.8 (*)    Albumin 2.8 (*)    Alkaline Phosphatase 269 (*)    GFR calc non Af Amer 59 (*)    All other components within normal limits  TROPONIN I  PROTIME-INR  MAGNESIUM  I-STAT TROPOININ, ED    Imaging Review Dg Chest Port 1 View  02/11/2016  CLINICAL DATA:  Acute onset of shortness of breath, tachycardia and indigestion. Nausea. Initial encounter. EXAM: PORTABLE CHEST 1 VIEW COMPARISON:  Chest radiograph performed 01/28/2016 FINDINGS: There is elevation of the left hemidiaphragm, with mild left basilar atelectasis. There is no evidence of pleural effusion or pneumothorax. The cardiomediastinal silhouette is mildly enlarged. The patient is status post median sternotomy. No acute osseous abnormalities are seen. IMPRESSION: Elevation of the left hemidiaphragm, with mild left basilar atelectasis. Mild cardiomegaly. Electronically Signed   By: Garald Balding M.D.   On:  02/11/2016 22:59   I have personally reviewed and evaluated these images and lab results as part of my medical decision-making.   EKG Interpretation   Date/Time:  Monday February 11 2016 22:15:41 EDT Ventricular Rate:  144 PR Interval:    QRS Duration: 90 QT Interval:  256 QTC Calculation: 396 R Axis:   38 Text Interpretation:  Atrial flutter with 2:1 A-V conduction Non-specific  ST-t changes Confirmed by Ashok Cordia  MD, Lennette Bihari ([id number]) on 02/11/2016 11:06:03  PM      MDM   Final diagnoses:  None   Patient presents to the ED for sob and rapid heart rate.  Blood pressure is low as well.  Prior to my evaluation, patient was given IVF and diltiazem, also placed on a drip.  This has not worked.  I stopped the drip and gave IV metoprolol without any effect as well. I consulted with cardiology who will admit the patient for cardioversion.  His BP is now 103/81 which is his baseline.  Patient currently stable.  Pads placed on patient in case he decompensates.   CRITICAL CARE Performed by: Everlene Balls   Total critical care time: 40 minutes - atrial flutter req dilt and metop  Critical care time was exclusive of separately billable procedures and treating other patients.  Critical care was necessary to treat or prevent imminent or life-threatening deterioration.  Critical care was time spent personally by me on the following activities: development of treatment plan with patient and/or surrogate as well as nursing, discussions with consultants, evaluation of patient's response to treatment, examination of patient, obtaining history from patient or surrogate, ordering and performing treatments and interventions, ordering and review of laboratory studies, ordering and review of radiographic studies, pulse oximetry and re-evaluation of patient's condition.     I personally performed the services described in this documentation, which was scribed in my presence. The recorded information has been  reviewed and is accurate.      Everlene Balls, MD 02/11/16 2355

## 2016-02-11 NOTE — ED Notes (Addendum)
Pt. reports palpitations " indigestion " with tachycardia onset this morning , denies chest pain , mild SOB and nausea. HR = 140+ at triage .

## 2016-02-11 NOTE — ED Notes (Signed)
Main lab to add on magnesium 

## 2016-02-11 NOTE — Telephone Encounter (Signed)
Pt is a 45 yo M w/ a PMH significant for CAD s/p 1vCABG 1/25. Pt has sinus problems and has allergy problems. Took an OTC nasal decongestant last night. Today BP has been 98/74 and HR has been 120-140 bpm. Took atenolol and HRs improved. Spoke to cardiologist earlier today and no changes. Pulse feels irregular. Pt feels palpitations. Denies lightheadedness or dizzy at this time. No CP. Endorses fatigue and DOE. Also has some indigestion and is belching a lot. Advised pt that persistently elevated HR, low BP, and constellation of symptoms are quite concerning. He may have a serious infxn or an issued with his heart. Advised he seem immediate medical attention at the closest emergency room.  Jay Schlichter, MD

## 2016-02-11 NOTE — ED Notes (Signed)
Pt with recent CABG x 1 January 25. C/o palpitations and increased HR yesterday after taking a nasal decongestant. Healing scar noted to center chest, no obvious signs of infection. Pt noted to be hypotensive in triage, denies dizziness.

## 2016-02-12 ENCOUNTER — Encounter (HOSPITAL_COMMUNITY)
Admission: EM | Disposition: A | Discharge status: Home / Self Care | Payer: Self-pay | Source: Home / Self Care | Attending: Cardiology

## 2016-02-12 ENCOUNTER — Encounter (HOSPITAL_COMMUNITY): Payer: Self-pay | Admitting: Physician Assistant

## 2016-02-12 DIAGNOSIS — Z8249 Family history of ischemic heart disease and other diseases of the circulatory system: Secondary | ICD-10-CM | POA: Diagnosis not present

## 2016-02-12 DIAGNOSIS — I10 Essential (primary) hypertension: Secondary | ICD-10-CM | POA: Diagnosis not present

## 2016-02-12 DIAGNOSIS — R0902 Hypoxemia: Secondary | ICD-10-CM | POA: Diagnosis not present

## 2016-02-12 DIAGNOSIS — Z7982 Long term (current) use of aspirin: Secondary | ICD-10-CM | POA: Diagnosis not present

## 2016-02-12 DIAGNOSIS — Z79899 Other long term (current) drug therapy: Secondary | ICD-10-CM | POA: Diagnosis not present

## 2016-02-12 DIAGNOSIS — G4733 Obstructive sleep apnea (adult) (pediatric): Secondary | ICD-10-CM | POA: Diagnosis present

## 2016-02-12 DIAGNOSIS — R319 Hematuria, unspecified: Secondary | ICD-10-CM | POA: Diagnosis present

## 2016-02-12 DIAGNOSIS — I129 Hypertensive chronic kidney disease with stage 1 through stage 4 chronic kidney disease, or unspecified chronic kidney disease: Secondary | ICD-10-CM | POA: Diagnosis present

## 2016-02-12 DIAGNOSIS — R739 Hyperglycemia, unspecified: Secondary | ICD-10-CM | POA: Diagnosis present

## 2016-02-12 DIAGNOSIS — D696 Thrombocytopenia, unspecified: Secondary | ICD-10-CM | POA: Diagnosis present

## 2016-02-12 DIAGNOSIS — I4892 Unspecified atrial flutter: Secondary | ICD-10-CM

## 2016-02-12 DIAGNOSIS — I313 Pericardial effusion (noninflammatory): Secondary | ICD-10-CM | POA: Diagnosis present

## 2016-02-12 DIAGNOSIS — I959 Hypotension, unspecified: Secondary | ICD-10-CM | POA: Diagnosis present

## 2016-02-12 DIAGNOSIS — E785 Hyperlipidemia, unspecified: Secondary | ICD-10-CM | POA: Diagnosis present

## 2016-02-12 DIAGNOSIS — I340 Nonrheumatic mitral (valve) insufficiency: Secondary | ICD-10-CM | POA: Diagnosis not present

## 2016-02-12 DIAGNOSIS — N183 Chronic kidney disease, stage 3 (moderate): Secondary | ICD-10-CM | POA: Diagnosis present

## 2016-02-12 DIAGNOSIS — Z955 Presence of coronary angioplasty implant and graft: Secondary | ICD-10-CM | POA: Diagnosis not present

## 2016-02-12 DIAGNOSIS — E7800 Pure hypercholesterolemia, unspecified: Secondary | ICD-10-CM | POA: Diagnosis present

## 2016-02-12 DIAGNOSIS — R51 Headache: Secondary | ICD-10-CM | POA: Diagnosis not present

## 2016-02-12 DIAGNOSIS — I429 Cardiomyopathy, unspecified: Secondary | ICD-10-CM | POA: Diagnosis present

## 2016-02-12 DIAGNOSIS — M109 Gout, unspecified: Secondary | ICD-10-CM | POA: Diagnosis present

## 2016-02-12 DIAGNOSIS — Z951 Presence of aortocoronary bypass graft: Secondary | ICD-10-CM | POA: Diagnosis not present

## 2016-02-12 DIAGNOSIS — I4891 Unspecified atrial fibrillation: Secondary | ICD-10-CM | POA: Diagnosis present

## 2016-02-12 DIAGNOSIS — N179 Acute kidney failure, unspecified: Secondary | ICD-10-CM | POA: Diagnosis not present

## 2016-02-12 DIAGNOSIS — Z6841 Body Mass Index (BMI) 40.0 and over, adult: Secondary | ICD-10-CM | POA: Diagnosis not present

## 2016-02-12 DIAGNOSIS — I2510 Atherosclerotic heart disease of native coronary artery without angina pectoris: Secondary | ICD-10-CM | POA: Diagnosis present

## 2016-02-12 HISTORY — DX: Unspecified atrial flutter: I48.92

## 2016-02-12 LAB — CBC
HEMATOCRIT: 37.8 % — AB (ref 39.0–52.0)
HEMOGLOBIN: 11.6 g/dL — AB (ref 13.0–17.0)
MCH: 28.9 pg (ref 26.0–34.0)
MCHC: 30.7 g/dL (ref 30.0–36.0)
MCV: 94.3 fL (ref 78.0–100.0)
Platelets: 104 10*3/uL — ABNORMAL LOW (ref 150–400)
RBC: 4.01 MIL/uL — ABNORMAL LOW (ref 4.22–5.81)
RDW: 14.8 % (ref 11.5–15.5)
WBC: 11 10*3/uL — ABNORMAL HIGH (ref 4.0–10.5)

## 2016-02-12 LAB — BASIC METABOLIC PANEL
ANION GAP: 12 (ref 5–15)
BUN: 15 mg/dL (ref 6–20)
CO2: 24 mmol/L (ref 22–32)
Calcium: 9 mg/dL (ref 8.9–10.3)
Chloride: 107 mmol/L (ref 101–111)
Creatinine, Ser: 1.26 mg/dL — ABNORMAL HIGH (ref 0.61–1.24)
GFR calc Af Amer: >60 mL/min (ref >60)
GFR calc non Af Amer: >60 mL/min (ref >60)
GLUCOSE: 119 mg/dL — AB (ref 65–99)
POTASSIUM: 3.5 mmol/L (ref 3.5–5.1)
Sodium: 143 mmol/L (ref 135–145)

## 2016-02-12 LAB — TSH: TSH: 3.432 u[IU]/mL (ref 0.350–4.500)

## 2016-02-12 LAB — PROTIME-INR
INR: 1.17 (ref 0.00–1.49)
PROTHROMBIN TIME: 15.1 s (ref 11.6–15.2)

## 2016-02-12 LAB — HEPARIN LEVEL (UNFRACTIONATED): HEPARIN UNFRACTIONATED: 0.54 [IU]/mL (ref 0.30–0.70)

## 2016-02-12 SURGERY — CARDIOVERSION
Anesthesia: Monitor Anesthesia Care

## 2016-02-12 MED ORDER — HEPARIN (PORCINE) IN NACL 100-0.45 UNIT/ML-% IJ SOLN
1750.0000 [IU]/h | INTRAMUSCULAR | Status: DC
  Administered 2016-02-12 (×2): 1750 [IU]/h via INTRAVENOUS
  Filled 2016-02-12 (×4): qty 250

## 2016-02-12 MED ORDER — AMIODARONE HCL IN DEXTROSE 360-4.14 MG/200ML-% IV SOLN
60.0000 mg/h | INTRAVENOUS | Status: DC
  Administered 2016-02-12: 60 mg/h via INTRAVENOUS
  Filled 2016-02-12 (×2): qty 200

## 2016-02-12 MED ORDER — DIGOXIN 0.25 MG/ML IJ SOLN
0.2500 mg | Freq: Once | INTRAMUSCULAR | Status: AC
  Administered 2016-02-12: 0.25 mg via INTRAVENOUS
  Filled 2016-02-12: qty 2

## 2016-02-12 MED ORDER — SODIUM CHLORIDE 0.9 % IV SOLN
INTRAVENOUS | Status: DC
  Administered 2016-02-12: 22:00:00 via INTRAVENOUS

## 2016-02-12 MED ORDER — HYDROCODONE-ACETAMINOPHEN 5-325 MG PO TABS
1.0000 | ORAL_TABLET | ORAL | Status: DC | PRN
  Administered 2016-02-12: 2 via ORAL
  Administered 2016-02-12 – 2016-02-13 (×2): 1 via ORAL
  Administered 2016-02-14 – 2016-02-16 (×6): 2 via ORAL
  Administered 2016-02-16: 1 via ORAL
  Filled 2016-02-12 (×3): qty 2
  Filled 2016-02-12: qty 1
  Filled 2016-02-12: qty 2
  Filled 2016-02-12: qty 1
  Filled 2016-02-12 (×2): qty 2
  Filled 2016-02-12: qty 1
  Filled 2016-02-12 (×3): qty 2

## 2016-02-12 MED ORDER — ASPIRIN 81 MG PO CHEW
81.0000 mg | CHEWABLE_TABLET | Freq: Every day | ORAL | Status: DC
  Administered 2016-02-12 – 2016-02-17 (×6): 81 mg via ORAL
  Filled 2016-02-12 (×6): qty 1

## 2016-02-12 MED ORDER — HEPARIN BOLUS VIA INFUSION
5000.0000 [IU] | Freq: Once | INTRAVENOUS | Status: AC
  Administered 2016-02-12: 5000 [IU] via INTRAVENOUS
  Filled 2016-02-12: qty 5000

## 2016-02-12 MED ORDER — APIXABAN 5 MG PO TABS
5.0000 mg | ORAL_TABLET | Freq: Two times a day (BID) | ORAL | Status: DC
  Administered 2016-02-12 – 2016-02-17 (×11): 5 mg via ORAL
  Filled 2016-02-12 (×11): qty 1

## 2016-02-12 MED ORDER — DILTIAZEM HCL 100 MG IV SOLR
5.0000 mg/h | INTRAVENOUS | Status: DC
  Administered 2016-02-12: 2.5 mg/h via INTRAVENOUS
  Administered 2016-02-13 (×2): 5 mg/h via INTRAVENOUS
  Administered 2016-02-14: 10 mg/h via INTRAVENOUS
  Filled 2016-02-12 (×4): qty 100

## 2016-02-12 MED ORDER — PANTOPRAZOLE SODIUM 20 MG PO TBEC
20.0000 mg | DELAYED_RELEASE_TABLET | Freq: Every day | ORAL | Status: DC
  Administered 2016-02-12 – 2016-02-17 (×6): 20 mg via ORAL
  Filled 2016-02-12 (×11): qty 1

## 2016-02-12 MED ORDER — AMIODARONE LOAD VIA INFUSION
150.0000 mg | Freq: Once | INTRAVENOUS | Status: DC
  Filled 2016-02-12: qty 83.34

## 2016-02-12 MED ORDER — DILTIAZEM HCL 100 MG IV SOLR: 5.0000 mg/h | INTRAVENOUS | Status: DC

## 2016-02-12 MED ORDER — DIGOXIN 0.25 MG/ML IJ SOLN
0.2500 mg | Freq: Every day | INTRAMUSCULAR | Status: DC
  Administered 2016-02-12 – 2016-02-13 (×2): 0.25 mg via INTRAVENOUS
  Filled 2016-02-12 (×2): qty 2

## 2016-02-12 MED ORDER — METOCLOPRAMIDE HCL 5 MG/ML IJ SOLN
10.0000 mg | Freq: Once | INTRAMUSCULAR | Status: AC
  Administered 2016-02-12: 10 mg via INTRAVENOUS
  Filled 2016-02-12: qty 2

## 2016-02-12 MED ORDER — ROSUVASTATIN CALCIUM 10 MG PO TABS
40.0000 mg | ORAL_TABLET | Freq: Every day | ORAL | Status: DC
  Administered 2016-02-12 – 2016-02-16 (×5): 40 mg via ORAL
  Filled 2016-02-12: qty 4
  Filled 2016-02-12: qty 1
  Filled 2016-02-12 (×5): qty 4

## 2016-02-12 MED ORDER — TRAMADOL HCL 50 MG PO TABS
50.0000 mg | ORAL_TABLET | ORAL | Status: DC | PRN
  Administered 2016-02-13: 50 mg via ORAL
  Administered 2016-02-14 – 2016-02-16 (×3): 100 mg via ORAL
  Filled 2016-02-12 (×4): qty 2

## 2016-02-12 MED ORDER — INDOMETHACIN 25 MG PO CAPS
50.0000 mg | ORAL_CAPSULE | Freq: Three times a day (TID) | ORAL | Status: DC | PRN
  Administered 2016-02-12: 50 mg via ORAL
  Filled 2016-02-12 (×2): qty 2

## 2016-02-12 MED ORDER — AMIODARONE HCL IN DEXTROSE 360-4.14 MG/200ML-% IV SOLN
30.0000 mg/h | INTRAVENOUS | Status: DC
  Administered 2016-02-12 – 2016-02-14 (×3): 30 mg/h via INTRAVENOUS
  Filled 2016-02-12 (×3): qty 200

## 2016-02-12 MED ORDER — FUROSEMIDE 10 MG/ML IJ SOLN
40.0000 mg | Freq: Once | INTRAMUSCULAR | Status: AC
  Administered 2016-02-12: 40 mg via INTRAVENOUS
  Filled 2016-02-12: qty 4

## 2016-02-12 NOTE — Progress Notes (Signed)
ANTICOAGULATION CONSULT NOTE - Initial Consult  Pharmacy Consult for Heparin Indication: atrial fibrillation  No Known Allergies  Patient Measurements: Height: 5\' 11"  (180.3 cm) Weight: (!) 325 lb 1 oz (147.447 kg) IBW/kg (Calculated) : 75.3 Heparin Dosing Weight: 109 kg  Vital Signs: Temp: 98.2 F (36.8 C) (03/13 2217) Temp Source: Oral (03/13 2217) BP: 93/78 mmHg (03/14 0017) Pulse Rate: 143 (03/14 0017)  Labs:  Recent Labs  02/11/16 2233 02/11/16 2235  HGB 12.3*  --   HCT 39.7  --   PLT 108*  --   LABPROT  --  13.8  INR  --  1.04  CREATININE 1.42*  --   TROPONINI  --  0.03    Estimated Creatinine Clearance: 98.8 mL/min (by C-G formula based on Cr of 1.42).   Medical History: Past Medical History  Diagnosis Date  . Hypercholesteremia     a. Prev taken off statin due to abnormal liver function.  . Hypertension   . Morbid obesity ([place])   . Abnormal liver enzymes     a. Sees a doctor in [place].  . Thrombocytopenia ([place])   . Hematuria     a. Chronic hx of this, no prior etiology determined through workup per patient.  . Sinus bradycardia   . 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 Coronary artery disease     Medications:  See electronic med rec  Assessment: 45 y.o. M presents with tachy/palpitations. Pt with CABG 12/26/15. Pt found to be in afib. To begin heparin. No AC PTA. Plan for DCCV. Hgb ok, plt 108 (low but stable).  Goal of Therapy:  Heparin level 0.3-0.7 units/ml Monitor platelets by anticoagulation protocol: Yes   Plan:  Heparin IV bolus 5000 units Heparin gtt at 1750 units/hr Will f/u heparin level in 6 hours Daily heparin level and CBC  Sherlon Handing, PharmD, BCPS Clinical pharmacist, pager [phone] 02/12/2016,12:28 AM

## 2016-02-12 NOTE — ED Notes (Signed)
Zoll pads placed on placed per MD request

## 2016-02-12 NOTE — ED Notes (Signed)
Heparin verified with Eme RN

## 2016-02-12 NOTE — ED Notes (Signed)
Patient has returned from using the bathroom; patient placed back on monitor, continuous pulse oximetry and blood pressure cuff; visitors at bedside

## 2016-02-12 NOTE — ED Notes (Signed)
Pt placed in hospital bed; denies chest pain, remains tachycardic

## 2016-02-12 NOTE — Progress Notes (Signed)
ANTICOAGULATION CONSULT NOTE - Initial Consult  Pharmacy Consult for Eliquis Indication: Non-Valvular AFib   No Known Allergies  Patient Measurements: Height: 5\' 11"  (180.3 cm) Weight: (!) 325 lb 1 oz (147.447 kg) IBW/kg (Calculated) : 75.3  Vital Signs: BP: 100/66 mmHg (03/14 0830) Pulse Rate: 150 (03/14 0830)  Labs:  Recent Labs  02/11/16 2233 02/11/16 2235 02/12/16 0405 02/12/16 0906  HGB 12.3*  --  11.6*  --   HCT 39.7  --  37.8*  --   PLT 108*  --  104*  --   LABPROT  --  13.8 15.1  --   INR  --  1.04 1.17  --   HEPARINUNFRC  --   --   --  0.54  CREATININE 1.42*  --  1.26*  --   TROPONINI  --  0.03  --   --     Estimated Creatinine Clearance: 111.3 mL/min (by C-G formula based on Cr of 1.26).   Medical History: Past Medical History  Diagnosis Date  . Hypercholesteremia     a. Prev taken off statin due to abnormal liver function.  . Hypertension   . Morbid obesity ([place])   . Abnormal liver enzymes     a. Sees a doctor in [place].  . Thrombocytopenia ([place])   . Hematuria     a. Chronic hx of this, no prior etiology determined through workup per patient.  . Sinus bradycardia   . 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 Coronary artery disease   . Atrial flutter with rapid ventricular response ([place]) 02/12/2016     Assessment: 60 yom admitted with new onset afib. Originally started on IV heparin. Cardiology now wanting to discontinue heparin and start apixaban with planned TEE/DCCV on Thursday.   Goal of Therapy:  Monitor platelets by anticoagulation protocol: Yes   Plan:  Stop heparin  Start apixaban 5mg  BID F/U TEE/DCCV on Thursday

## 2016-02-12 NOTE — ED Notes (Signed)
Patient taken to the bathroom to have a bowel movement via wheelchair; mother is in the bathroom with him

## 2016-02-12 NOTE — ED Notes (Signed)
Pt taken to the bathroom via wheelchair 

## 2016-02-12 NOTE — ED Notes (Signed)
Per Santiago Glad, RN reheated patient's lunch plate; patient is not NPO at this time

## 2016-02-12 NOTE — ED Notes (Signed)
Report called to 3 west RN

## 2016-02-12 NOTE — ED Notes (Signed)
Spoke to [facility] concerning tachycardia and blood pressure, no interventions at this time. Pt remain A&Ox4.

## 2016-02-12 NOTE — ED Notes (Signed)
Pt up to bathroom.

## 2016-02-12 NOTE — H&P (Signed)
History & Physical    Patient ID: Sean [name] MRN: [id number], DOB/AGE: 43-Aug-1972   Admit date: 02/11/2016   Primary Physician: [name], MD Primary Cardiologist: See EPIC  Patient Profile    Pt is a 45 yo M w/ a PMH significant for CAD s/p 1vCABG p/w palpitations and fatigue.  Past Medical History   Past Medical History  Diagnosis Date  . Hypercholesteremia     a. Prev taken off statin due to abnormal liver function.  . Hypertension   . Morbid obesity ([place])   . Abnormal liver enzymes     a. Sees a doctor in [place].  . Thrombocytopenia ([place])   . Hematuria     a. Chronic hx of this, no prior etiology determined through workup per patient.  . Sinus bradycardia   . 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 Coronary artery disease     Past Surgical History  Procedure Laterality Date  . Gallbladder surgery    . Appendectomy    . Hernia repair    . Cardiac catheterization N/A 12/24/2015    Procedure: Right/Left Heart Cath and Coronary Angiography;  Surgeon: Jolaine Artist, MD;  Location: [facility];  Service: Cardiovascular;  Laterality: N/A;  . Coronary artery bypass graft N/A 12/26/2015    Procedure: Off Pump Coronary Artery Bypass Grafting times one using left internal mammary artery;  Surgeon: Rexene Alberts, MD;  Location: [facility];  Service: Open Heart Surgery;  Laterality: N/A;  . Tee without cardioversion N/A 12/26/2015    Procedure: TRANSESOPHAGEAL ECHOCARDIOGRAM (TEE);  Surgeon: Rexene Alberts, MD;  Location: [facility];  Service: Open Heart Surgery;  Laterality: N/A;    Allergies  No Known Allergies  History of Present Illness    Pt recently diagnosed with 1vCAD and LIMA->LAD 12/2015. Unremarkable post-op course and d/c'ed after 5 days. Subsequently had a gout flare managed with indomethacin. For the past few days has been having URI type symptoms. Complains of rhinorrhea and cough productive of sputum. Minimal SOB. Took some OTC  phenylephrine and Benadryl. Woke up this AM with heart racing. Pulse ~140 bpm all day. Felt somewhat fatigued, SOB, and presyncopal w/ ambulation but asymptomatic at rest. Presented to ER and had afluter on EKG and neg trop. Stable CKD. CXR unchanged from prior. Received IV dilt bolus/gtt and IV metop and became hypotensive without any improvement in HR. Cards consulted for admission.  Home Medications    Prior to Admission medications   Medication Sig Start Date End Date Taking? Authorizing Provider  aspirin 325 MG tablet Take 325 mg by mouth daily.   Yes Historical Provider, MD  atenolol (TENORMIN) 25 MG tablet Take 0.5 tablets (12.5 mg total) by mouth 2 (two) times daily. Patient taking differently: Take 25 mg by mouth daily.  12/31/15  Yes [name] R Barrett, PA-C  cetirizine (ZYRTEC) 10 MG tablet Take 10 mg by mouth daily.   Yes Historical Provider, MD  furosemide (LASIX) 40 MG tablet Take 1 tablet (40 mg total) by mouth daily. 02/11/16  Yes Eileen Stanford, PA-C  indomethacin (INDOCIN) 50 MG capsule Take 1 capsule (50 mg total) by mouth 3 (three) times daily as needed. 01/14/16  Yes Dorothy Spark, MD  omeprazole (PRILOSEC) 20 MG capsule Take 20 mg by mouth daily.   Yes Historical Provider, MD  phenylephrine (SUDAFED PE) 10 MG TABS tablet Take 10 mg by mouth every 4 (four) hours as needed (  for congestion).   Yes Historical Provider, MD  potassium chloride (K-DUR,KLOR-CON) 10 MEQ tablet Take 2 tablets (20 mEq total) by mouth daily. 02/11/16  Yes Eileen Stanford, PA-C  rosuvastatin (CRESTOR) 40 MG tablet Take 1 tablet (40 mg total) by mouth daily at 6 PM. 12/31/15  Yes [name] R Barrett, PA-C  traMADol (ULTRAM) 50 MG tablet Take 1-2 tablets (50-100 mg total) by mouth every 4 (four) hours as needed for moderate pain. 01/18/16  Yes Rexene Alberts, MD  aspirin EC 325 MG EC tablet Take 1 tablet (325 mg total) by mouth daily. Patient not taking: Reported on 02/11/2016 12/31/15   Lodema Hong Barrett, PA-C     Family History    Family History  Problem Relation Age of Onset  . Heart attack    . Hyperlipidemia    . CAD Father     2 stents ~ 42   Social History    Social History   Social History  . Marital Status: Legally Separated    Spouse Name: N/A  . Number of Children: N/A  . Years of Education: N/A   Occupational History  . Truck Geophysicist/field seismologist    Social History Main Topics  . Smoking status: Never Smoker   . Smokeless tobacco: Not on file  . Alcohol Use: Yes  . Drug Use: No  . Sexual Activity: Not on file   Other Topics Concern  . Not on file   Social History Narrative    Review of Systems    General:  No chills, fever, night sweats or weight changes.  Cardiovascular:  Per HPI. Dermatological: No rash, lesions/masses Respiratory: No cough, dyspnea Urologic: No hematuria, dysuria Abdominal:   No nausea, vomiting, diarrhea, bright red blood per rectum, melena, or hematemesis Neurologic:  No visual changes, wkns, changes in mental status. All other systems reviewed and are otherwise negative except as noted above.  Physical Exam    Blood pressure 104/77, pulse 141, temperature 98.2 F (36.8 C), temperature source Oral, resp. rate 25, height 5\' 11"  (1.803 m), weight 325 lb 1 oz (147.447 kg), SpO2 97 %.  General: Pleasant, NAD, morbidly obese. Psych: Normal affect. Neuro: Alert and oriented X 3. Moves all extremities spontaneously. HEENT: Normal  Neck: Supple without bruits or JVD. Lungs:  Resp regular and unlabored, CTA. Heart: tachy, regular, no s3, s4, or murmurs. Abdomen: Soft, non-tender, non-distended, BS + x 4.  Extremities: No clubbing, cyanosis or edema. DP/PT/Radials 2+ and equal bilaterally.  Labs    Troponin [facility] of Care Test)  Recent Labs  02/11/16 2233  TROPIPOC 0.00    Recent Labs  02/11/16 2235  TROPONINI 0.03   Lab Results  Component Value Date   WBC 11.9* 02/11/2016   HGB 12.3* 02/11/2016   HCT 39.7 02/11/2016   MCV 94.3  02/11/2016   PLT 108* 02/11/2016    Recent Labs Lab 02/11/16 2233  NA 142  K 4.2  CL 107  CO2 26  BUN 17  CREATININE 1.42*  CALCIUM 8.8*  PROT 6.8  BILITOT 0.7  ALKPHOS 269*  ALT 30  AST 29  GLUCOSE 121*   Lab Results  Component Value Date   CHOL 183 12/24/2015   HDL 32* 12/24/2015   LDLCALC 121* 12/24/2015   TRIG 149 12/24/2015   No results found for: [facility]   Radiology Studies    Dg Chest 2 View  01/28/2016  CLINICAL DATA:  45 year old male with history of CABG. Anterior chest tightness. Bradycardia. EXAM:  CHEST  2 VIEW COMPARISON:  Chest x-ray 12/29/2015. FINDINGS: Chronic elevation of left hemidiaphragm. No acute consolidative airspace disease. No pleural effusions. No evidence of pulmonary edema. Cardiomegaly. Mild rightward shift of cardiac structures secondary to chronically elevated left hemidiaphragm. Upper mediastinal contours are within normal limits. Status post median sternotomy. IMPRESSION: 1. No radiographic evidence of acute cardiopulmonary disease. 2. Chronic elevation of left hemidiaphragm. Electronically Signed   By: Vinnie Langton M.D.   On: 01/28/2016 14:18   Dg Chest Port 1 View  02/11/2016  CLINICAL DATA:  Acute onset of shortness of breath, tachycardia and indigestion. Nausea. Initial encounter. EXAM: PORTABLE CHEST 1 VIEW COMPARISON:  Chest radiograph performed 01/28/2016 FINDINGS: There is elevation of the left hemidiaphragm, with mild left basilar atelectasis. There is no evidence of pleural effusion or pneumothorax. The cardiomediastinal silhouette is mildly enlarged. The patient is status post median sternotomy. No acute osseous abnormalities are seen. IMPRESSION: Elevation of the left hemidiaphragm, with mild left basilar atelectasis. Mild cardiomegaly. Electronically Signed   By: Garald Balding M.D.   On: 02/11/2016 22:59   ECG & Cardiac Imaging    EKG - typical flutter  Assessment & Plan   Pt is a 45 yo M w/ a PMH significant for CAD s/p  1vCABG p/w palpitations and fatigue.  #Atrial Flutter, New-Onset: No prior hx. Symptoms at least 16 hrs but possibly longer. Became hypotensive with AV nodal agents. Asymptomatic at rest. Will likely TEE/DCCV in AM. May consider short-term amio load. -NPO after midnight -TEE/DCCV in AM, DCCV urgently/emergently if unstable -hold AV nodal agents, may consider amio load per day team to prevent recurrence -heparin gtt, would transition to [facility] for at least 30 days and possible longer for primary stroke prevention  #1vCAD s/p CABG -switch to baby ASA -continue rosuvastatin -BP control per prior  #FEN/GI -hold lasix -replete lytes PRN -NPO after midnight, resume cardiac diet  #PPx -PPI -on heparin gtt  #Dispo -pending w/u and eval  Signed, Bari Edward, MD  02/12/2016, 12:21 AM

## 2016-02-12 NOTE — ED Notes (Signed)
Cardiology notified -- will round on patient this am.

## 2016-02-12 NOTE — ED Notes (Signed)
Hospital bed ordered for pt; uncomfortable in bed

## 2016-02-12 NOTE — ED Notes (Signed)
Alert, NAD, calm, interactive, resps e/u, speaking in clear complete sentences, sitting on edge of bed, (denies: sob, pain or nausea), VSS, afib HR 142, BP 111/92 (97).

## 2016-02-12 NOTE — ED Notes (Signed)
HR lower, BP higher (improved), continuing to monitor, pharm consulted, digoxin due.

## 2016-02-12 NOTE — ED Notes (Signed)
Pt becoming inpatient waiting for bed-- is uncomfortable in bed.

## 2016-02-12 NOTE — Progress Notes (Addendum)
Patient Name: Sean [name] Date of Encounter: 02/12/2016  Principal Problem:   Atrial flutter with rapid ventricular response ([place]) Active Problems:   Essential hypertension   Hyperglycemia   OSA (obstructive sleep apnea)   Primary Cardiologist: Dr Meda Coffee  Patient Profile: 45 y.o. 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 D/c 12/31/2015 after CABG w/ LIMA-LAD. Gout flare since d/c.   Pt admitted 03/14 w/ atrial fib, RVR  SUBJECTIVE: Pt took a decongestant Sunday night, felt his heart racing Monday am. Has not felt that before. However, his heart rate currently is > 140 and he is unaware of it. LE edema is chronic  OBJECTIVE Filed Vitals:   02/12/16 0430 02/12/16 0500 02/12/16 0530 02/12/16 0557  BP: 101/84 100/81 104/89 104/89  Pulse: 143 145 143 147  Temp:      TempSrc:      Resp: 24 19 21 22   Height:      Weight:      SpO2: 95% 100% 97% 98%    Intake/Output Summary (Last 24 hours) at 02/12/16 0925 Last data filed at 02/12/16 0153  Gross per 24 hour  Intake   1000 ml  Output    300 ml  Net    700 ml   Filed Weights   02/11/16 2217  Weight: 325 lb 1 oz (147.447 kg)    PHYSICAL EXAM General: Well developed, well nourished, male in no acute distress. Head: Normocephalic, atraumatic.  Neck: Supple without bruits, JVD not elevated but difficult to assess 2nd body habitus. Lungs:  Resp regular and unlabored, CTA. Heart: Rapid and regular, S1, S2, no S3, S4, or murmur; no rub. Abdomen: Soft, non-tender, non-distended, BS + x 4.  Extremities: No clubbing, cyanosis, 1+ edema.  Neuro: Alert and oriented X 3. Moves all extremities spontaneously. Psych: Normal affect.  LABS: CBC:  Recent Labs  02/11/16 2233 02/12/16 0405    WBC 11.9* 11.0*  NEUTROABS 6.9  --   HGB 12.3* 11.6*  HCT 39.7 37.8*  MCV 94.3 94.3  PLT 108* 104*   INR:  Recent Labs  02/12/16 0405  INR [id number]   Basic Metabolic Panel:  Recent Labs  02/11/16 2233 02/11/16 2318 02/12/16 0405  NA 142  --  143  K 4.2  --  3.5  CL 107  --  107  CO2 26  --  24  GLUCOSE 121*  --  119*  BUN 17  --  15  CREATININE 1.42*  --  1.26*  CALCIUM 8.8*  --  9.0  MG  --  1.8  --    Liver Function Tests:  Recent Labs  02/11/16 2233  AST 29  ALT 30  ALKPHOS 269*  BILITOT 0.7  PROT 6.8  ALBUMIN 2.8*   Cardiac Enzymes:  Recent Labs  02/11/16 2235  TROPONINI 0.03    Recent Labs  02/11/16 2233  TROPIPOC 0.00   Thyroid Function Tests:  Recent Labs  02/12/16 0405  TSH 3.432   TELE:   Atrial flutter, RVR   Radiology/Studies: Dg Chest Port 1 View  02/11/2016  CLINICAL DATA:  Acute onset of shortness of breath, tachycardia and indigestion. Nausea. Initial encounter. EXAM: PORTABLE CHEST 1 VIEW COMPARISON:  Chest radiograph performed 01/28/2016 FINDINGS: There is elevation of the left hemidiaphragm, with  mild left basilar atelectasis. There is no evidence of pleural effusion or pneumothorax. The cardiomediastinal silhouette is mildly enlarged. The patient is status post median sternotomy. No acute osseous abnormalities are seen. IMPRESSION: Elevation of the left hemidiaphragm, with mild left basilar atelectasis. Mild cardiomegaly. Electronically Signed   By: Garald Balding M.D.   On: 02/11/2016 22:59     Current Medications:  . aspirin  81 mg Oral Daily  . pantoprazole  20 mg Oral Daily  . rosuvastatin  40 mg Oral q1800   . heparin 1,750 Units/hr (02/12/16 0126)    ASSESSMENT AND PLAN: Principal Problem:   Atrial flutter with rapid ventricular response (HCC) - pt was on Cardizem, d/c'd 2nd low BP - discuss amio with MD - His first sx were yesterday am  Active Problems:   Essential hypertension - BP on the low side right  now - Lasix on hold    Hyperglycemia - ck A1c    OSA (obstructive sleep apnea) - needs CPAP  Signed, Barrett, Rhonda , PA-C 9:25 AM 02/12/2016  The patient was seen, examined and discussed with Rosaria Ferries, PA-C and I agree with the above.   45 year old male post recent CABD x 1, with onset atrial flutter with RVR, didn't tolerate cardizem drip as he became hypotensive, it seems that there is clear beginning of arrhythmia yesterday after use of nasal decongestant, we will cardiovert in the ER.   The patient required prolonged time for decision making and discussion with the patient about the plan, total time spent with the patient 25 minutes.   Dorothy Spark 02/12/2016

## 2016-02-12 NOTE — ED Notes (Signed)
BP lower, not tolerating low dose cardizem, 82/61 (70), asymptomatic, HR remains 140.

## 2016-02-12 NOTE — Progress Notes (Signed)
ANTICOAGULATION CONSULT NOTE  Pharmacy Consult for Heparin Indication: atrial fibrillation  No Known Allergies  Patient Measurements: Height: 5\' 11"  (180.3 cm) Weight: (!) 325 lb 1 oz (147.447 kg) IBW/kg (Calculated) : 75.3 Heparin Dosing Weight: 109 kg  Vital Signs: Temp: 98.2 F (36.8 C) (03/13 2217) Temp Source: Oral (03/13 2217) BP: 104/89 mmHg (03/14 0557) Pulse Rate: 147 (03/14 0557)  Labs:  Recent Labs  02/11/16 2233 02/11/16 2235 02/12/16 0405  HGB 12.3*  --  11.6*  HCT 39.7  --  37.8*  PLT 108*  --  104*  LABPROT  --  13.8 15.1  INR  --  1.04 1.17  CREATININE 1.42*  --  1.26*  TROPONINI  --  0.03  --     Estimated Creatinine Clearance: 111.3 mL/min (by C-G formula based on Cr of 1.26).   Medical History: Past Medical History  Diagnosis Date  . Hypercholesteremia     a. Prev taken off statin due to abnormal liver function.  . Hypertension   . Morbid obesity ([place])   . Abnormal liver enzymes     a. Sees a doctor in [place].  . Thrombocytopenia ([place])   . Hematuria     a. Chronic hx of this, no prior etiology determined through workup per patient.  . Sinus bradycardia   . 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 Coronary artery disease    Assessment: 45 y.o. M presents with tachy/palpitations. Pt with CABG 12/26/15. Pt found to be in afib. To begin heparin. No AC PTA. Plan for DCCV. Hgb ok, plt 108 (low but stable). HL therapeutic x1 (0.54) on 1750 units/h. No bleed.  Goal of Therapy:  Heparin level 0.3-0.7 units/ml Monitor platelets by anticoagulation protocol: Yes   Plan:  Heparin gtt at 1750 units/hr Will f/u heparin level in 6 hours to confirm Daily heparin level and CBC Monitor s/sx bleeding  Elicia Lamp, PharmD, [facility] Clinical Pharmacist Pager [phone] 02/12/2016 7:34 AM

## 2016-02-13 ENCOUNTER — Encounter: Payer: Self-pay | Admitting: *Deleted

## 2016-02-13 DIAGNOSIS — Z951 Presence of aortocoronary bypass graft: Secondary | ICD-10-CM

## 2016-02-13 LAB — CBC
HCT: 38.6 % — ABNORMAL LOW (ref 39.0–52.0)
Hemoglobin: 11.9 g/dL — ABNORMAL LOW (ref 13.0–17.0)
MCH: 29.2 pg (ref 26.0–34.0)
MCHC: 30.8 g/dL (ref 30.0–36.0)
MCV: 94.6 fL (ref 78.0–100.0)
PLATELETS: 99 10*3/uL — AB (ref 150–400)
RBC: 4.08 MIL/uL — ABNORMAL LOW (ref 4.22–5.81)
RDW: 14.9 % (ref 11.5–15.5)
WBC: 9.2 10*3/uL (ref 4.0–10.5)

## 2016-02-13 LAB — PROTIME-INR
INR: 1.21 (ref 0.00–1.49)
Prothrombin Time: 15.5 seconds — ABNORMAL HIGH (ref 11.6–15.2)

## 2016-02-13 LAB — BASIC METABOLIC PANEL
ANION GAP: 11 (ref 5–15)
BUN: 16 mg/dL (ref 6–20)
CALCIUM: 9 mg/dL (ref 8.9–10.3)
CO2: 26 mmol/L (ref 22–32)
CREATININE: 1.46 mg/dL — AB (ref 0.61–1.24)
Chloride: 104 mmol/L (ref 101–111)
GFR calc Af Amer: >60 mL/min (ref >60)
GFR calc non Af Amer: 57 mL/min — ABNORMAL LOW (ref >60)
GLUCOSE: 136 mg/dL — AB (ref 65–99)
POTASSIUM: 3.4 mmol/L — AB (ref 3.5–5.1)
SODIUM: 141 mmol/L (ref 135–145)

## 2016-02-13 LAB — MRSA PCR SCREENING: MRSA BY PCR: NEGATIVE

## 2016-02-13 MED ORDER — ONDANSETRON HCL 4 MG/2ML IJ SOLN
4.0000 mg | Freq: Four times a day (QID) | INTRAMUSCULAR | Status: DC | PRN
  Administered 2016-02-13 – 2016-02-16 (×6): 4 mg via INTRAVENOUS
  Filled 2016-02-13 (×6): qty 2

## 2016-02-13 MED ORDER — POTASSIUM CHLORIDE CRYS ER 20 MEQ PO TBCR
40.0000 meq | EXTENDED_RELEASE_TABLET | Freq: Once | ORAL | Status: AC
  Administered 2016-02-13: 40 meq via ORAL
  Filled 2016-02-13: qty 2

## 2016-02-13 MED ORDER — DIGOXIN 0.25 MG/ML IJ SOLN
0.1250 mg | Freq: Every day | INTRAMUSCULAR | Status: DC
  Administered 2016-02-14: 0.125 mg via INTRAVENOUS
  Filled 2016-02-13: qty 2

## 2016-02-13 NOTE — Progress Notes (Signed)
UR Completed Dayveon Halley Graves-Bigelow, RN,BSN [phone]  

## 2016-02-13 NOTE — Clinical Documentation Improvement (Signed)
  Cardiology   Would you please specify medical condition related to clinical findngs listed below?   Acute Renal Failure/Acute Kidney Injury  Acute Tubular Necrosis  Acute Renal Cortical Necrosis  Acute Renal Medullary Necrosis  Acute on Chronic Renal Failure  Chronic Renal Failure  Other  Clinically Undetermined  Document any associated diagnoses/conditions.   Supporting Information: Bun 24; Creatinine 1.26 and 1.46; GFR >60   Please exercise your independent, professional judgment when responding. A specific answer is not anticipated or expected.   Thank You,  [place] [phone]

## 2016-02-13 NOTE — Clinical Documentation Improvement (Signed)
Cardiology  Can the diagnosis of CKD be further specified?   CKD Stage I - GFR greater than or equal to 90  CKD Stage II - GFR 60-89  CKD Stage III - GFR 30-59  CKD Stage IV - GFR 15-29  CKD Stage V - GFR < 15  ESRD (End Stage Renal Disease)  Other condition  Unable to clinically determine   Supporting Information: : (risk factors, signs and symptoms, diagnostics, treatment) States in H&P that CKD is stable.  BUN 15, Creatinine - 1.26, 1.46 GFR >60  Please exercise your independent, professional judgment when responding. A specific answer is not anticipated or expected.   Thank You, [place] [phone]

## 2016-02-13 NOTE — Progress Notes (Signed)
Patient ID: Sean [name], male   DOB: [birth year], 45 y.o.   MRN: [id number] A referral was faxed to [facility] on 01/28/16 to contact Mr. Martha to begin Phase II per the request of Dr. Darylene Price.

## 2016-02-13 NOTE — Progress Notes (Addendum)
Patient Name: Sean [name] Date of Encounter: 02/13/2016   Primary Cardiologist: Dr Meda Coffee Patient Profile: 45 y.o. 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 D/c 12/31/2015 after CABG w/ LIMA-LAD. Gout flare since d/c.   Pt admitted 03/14 w/ atrial fib/aflutter, RVR  SUBJECTIVE  Feeling well. No chest pain, sob or palpitations.   CURRENT MEDS . amiodarone  150 mg Intravenous Once  . apixaban  5 mg Oral BID  . aspirin  81 mg Oral Daily  . digoxin  0.25 mg Intravenous Daily  . pantoprazole  20 mg Oral Daily  . potassium chloride  40 mEq Oral Once  . rosuvastatin  40 mg Oral q1800   . sodium chloride 20 mL/hr at 02/12/16 2200  . amiodarone 30 mg/hr (02/12/16 2140)  . diltiazem (CARDIZEM) infusion 5 mg/hr (02/13/16 0619)   OBJECTIVE  Filed Vitals:   02/13/16 0400 02/13/16 0500 02/13/16 0700 02/13/16 1100  BP: 105/80 125/92 99/70 123/86  Pulse: 75 25 112 80  Temp: 98.7 F (37.1 C)  98.4 F (36.9 C) 98.3 F (36.8 C)  TempSrc: Oral  Oral Oral  Resp: 22 21 24 21   Height:      Weight: 324 lb 12.8 oz (147.328 kg)     SpO2: 93% 95% 97% 96%    Intake/Output Summary (Last 24 hours) at 02/13/16 1212 Last data filed at 02/13/16 1155  Gross per 24 hour  Intake    845 ml  Output    700 ml  Net    145 ml   Filed Weights   02/11/16 2217 02/12/16 2100 02/13/16 0400  Weight: 325 lb 1 oz (147.447 kg) 323 lb 10.2 oz (146.8 kg) 324 lb 12.8 oz (147.328 kg)    PHYSICAL EXAM  General: Pleasant, NAD. Neuro: Alert and oriented X 3. Moves all extremities spontaneously. Psych: Normal affect. HEENT:  Normal  Neck: Supple without bruits or JVD. Lungs:  Resp regular and unlabored, CTA. Heart: Regular tachycardiac  no s3, s4, or murmurs. Abdomen:  Soft, non-tender, non-distended, BS + x 4.  Extremities: No clubbing, cyanosis or edema. DP/PT/Radials 2+ and equal bilaterally.  Accessory Clinical Findings  CBC  Recent Labs  02/11/16 2233 02/12/16 0405 02/13/16 0420  WBC 11.9* 11.0* 9.2  NEUTROABS 6.9  --   --   HGB 12.3* 11.6* 11.9*  HCT 39.7 37.8* 38.6*  MCV 94.3 94.3 94.6  PLT 108* 104* 99*   Basic Metabolic Panel  Recent Labs  02/11/16 2318 02/12/16 0405 02/13/16 0420  NA  --  143 141  K  --  3.5 3.4*  CL  --  107 104  CO2  --  24 26  GLUCOSE  --  119* 136*  BUN  --  15 16  CREATININE  --  1.26* 1.46*  CALCIUM  --  9.0 9.0  MG 1.8  --   --    Liver Function Tests  Recent Labs  02/11/16 2233  AST 29  ALT 30  ALKPHOS 269*  BILITOT 0.7  PROT 6.8  ALBUMIN 2.8*    Recent Labs  02/11/16 2235  TROPONINI 0.03   Thyroid Function Tests  Recent Labs  02/12/16 0405  TSH 3.432    TELE  Aflutter at rate of 120s  Radiology/Studies  Dg Chest 2 View  01/28/2016  CLINICAL DATA:  45 year old male with history of CABG. Anterior chest tightness. Bradycardia. EXAM: CHEST  2 VIEW COMPARISON:  Chest x-ray 12/29/2015. FINDINGS: Chronic elevation of left hemidiaphragm. No acute consolidative airspace disease. No pleural effusions. No evidence of pulmonary edema. Cardiomegaly. Mild rightward shift of cardiac structures secondary to chronically elevated left hemidiaphragm. Upper mediastinal contours are within normal limits. Status post median sternotomy. IMPRESSION: 1. No radiographic evidence of acute cardiopulmonary disease. 2. Chronic elevation of left hemidiaphragm. Electronically Signed   By: Vinnie Langton M.D.   On: 01/28/2016 14:18   Dg Chest Port 1 View  02/11/2016  CLINICAL DATA:  Acute onset of shortness of breath, tachycardia and indigestion. Nausea. Initial encounter. EXAM: PORTABLE CHEST 1 VIEW COMPARISON:  Chest radiograph performed 01/28/2016 FINDINGS: There is elevation of the left hemidiaphragm,  with mild left basilar atelectasis. There is no evidence of pleural effusion or pneumothorax. The cardiomediastinal silhouette is mildly enlarged. The patient is status post median sternotomy. No acute osseous abnormalities are seen. IMPRESSION: Elevation of the left hemidiaphragm, with mild left basilar atelectasis. Mild cardiomegaly. Electronically Signed   By: Garald Balding M.D.   On: 02/11/2016 22:59    ASSESSMENT AND PLAN  1. Atrial flutter with rapid ventricular response (HCC) - didn't tolerate cardizem drip initially in ER as he became hypotensive, it seems that there is clear beginning of arrhythmia yesterday after use of nasal decongestant. Denies DCCV in ER.  - On digoxin, IV amio and IV cardizem. Rate still in 120s. Plan for TEE/DCCV tomorrow. Eliquis for anticoagulation. TSH normal.   The risks and benefits of transesophageal echocardiogram have been explained including risks of esophageal damage, perforation (1:10,000 risk), bleeding, pharyngeal hematoma as well as other potential complications associated with conscious sedation including aspiration, arrhythmia, respiratory failure and death. Alternatives to treatment were discussed, questions were answered. Patient is willing to proceed.  NPO after midnight. Meds with sips.   2. Essential hypertension - BP improved  3. OSA (obstructive sleep apnea) - On CPAP  4. HL - Continue statin.     5. CAD s/p CABG x1V (12/2015):  - Not a candidate for DAPT due to chronic thrombocytopenia  Signed, Bhagat,Bhavinkumar PA-C Pager [phone]  The patient was seen, examined and discussed with Bhagat,Bhavinkumar PA-C and I agree with the above.   45 year old male post recent CABD x 1, admitted yesterday with palpitations, SOB, found to be in atrial flutter with RVR, didn't tolerate cardizem drip as he became hypotensive, since he reported that he might have been feeling like this for a week we started on anticoagulation and plan to perform  TEE/DCCV tomorrow as there is no availability today. He remains tachycardic on amiodarone drip, cardizem drip and digoxin.  Dorothy Spark 02/13/2016

## 2016-02-14 ENCOUNTER — Encounter (HOSPITAL_COMMUNITY)
Admission: EM | Disposition: A | Discharge status: Home / Self Care | Payer: Self-pay | Source: Home / Self Care | Attending: Cardiology

## 2016-02-14 ENCOUNTER — Inpatient Hospital Stay (HOSPITAL_COMMUNITY): Payer: BLUE CROSS/BLUE SHIELD

## 2016-02-14 ENCOUNTER — Inpatient Hospital Stay (HOSPITAL_COMMUNITY): Payer: BLUE CROSS/BLUE SHIELD | Admitting: Anesthesiology

## 2016-02-14 ENCOUNTER — Encounter (HOSPITAL_COMMUNITY): Payer: Self-pay | Admitting: *Deleted

## 2016-02-14 DIAGNOSIS — I4892 Unspecified atrial flutter: Secondary | ICD-10-CM

## 2016-02-14 DIAGNOSIS — N189 Chronic kidney disease, unspecified: Secondary | ICD-10-CM

## 2016-02-14 DIAGNOSIS — I340 Nonrheumatic mitral (valve) insufficiency: Secondary | ICD-10-CM

## 2016-02-14 DIAGNOSIS — N179 Acute kidney failure, unspecified: Secondary | ICD-10-CM

## 2016-02-14 HISTORY — PX: TEE WITHOUT CARDIOVERSION: SHX5443

## 2016-02-14 HISTORY — PX: CARDIOVERSION: SHX1299

## 2016-02-14 LAB — CBC
HEMATOCRIT: 40.5 % (ref 39.0–52.0)
HEMOGLOBIN: 12.8 g/dL — AB (ref 13.0–17.0)
MCH: 29.8 pg (ref 26.0–34.0)
MCHC: 31.6 g/dL (ref 30.0–36.0)
MCV: 94.4 fL (ref 78.0–100.0)
Platelets: 95 10*3/uL — ABNORMAL LOW (ref 150–400)
RBC: 4.29 MIL/uL (ref 4.22–5.81)
RDW: 14.8 % (ref 11.5–15.5)
WBC: 9.5 10*3/uL (ref 4.0–10.5)

## 2016-02-14 LAB — BASIC METABOLIC PANEL
Anion gap: 10 (ref 5–15)
BUN: 10 mg/dL (ref 6–20)
CHLORIDE: 105 mmol/L (ref 101–111)
CO2: 24 mmol/L (ref 22–32)
Calcium: 9.3 mg/dL (ref 8.9–10.3)
Creatinine, Ser: 1.34 mg/dL — ABNORMAL HIGH (ref 0.61–1.24)
GFR calc Af Amer: >60 mL/min (ref >60)
GLUCOSE: 149 mg/dL — AB (ref 65–99)
POTASSIUM: 4.3 mmol/L (ref 3.5–5.1)
Sodium: 139 mmol/L (ref 135–145)

## 2016-02-14 LAB — DIGOXIN LEVEL: Digoxin Level: 0.8 ng/mL (ref 0.8–2.0)

## 2016-02-14 SURGERY — CARDIOVERSION
Anesthesia: General

## 2016-02-14 MED ORDER — FENTANYL CITRATE (PF) 100 MCG/2ML IJ SOLN
INTRAMUSCULAR | Status: AC
  Filled 2016-02-14: qty 2

## 2016-02-14 MED ORDER — SUCCINYLCHOLINE CHLORIDE 20 MG/ML IJ SOLN
INTRAMUSCULAR | Status: DC | PRN
  Administered 2016-02-14: 170 mg via INTRAVENOUS

## 2016-02-14 MED ORDER — ACETAMINOPHEN 325 MG PO TABS
650.0000 mg | ORAL_TABLET | Freq: Once | ORAL | Status: AC
  Administered 2016-02-14: 650 mg via ORAL

## 2016-02-14 MED ORDER — PROPOFOL 10 MG/ML IV BOLUS
INTRAVENOUS | Status: DC | PRN
  Administered 2016-02-14: 130 mg via INTRAVENOUS

## 2016-02-14 MED ORDER — LIDOCAINE HCL (CARDIAC) 20 MG/ML IV SOLN
INTRAVENOUS | Status: DC | PRN
  Administered 2016-02-14: 40 mg via INTRAVENOUS

## 2016-02-14 MED ORDER — ACETAMINOPHEN 325 MG PO TABS
ORAL_TABLET | ORAL | Status: AC
  Filled 2016-02-14: qty 2

## 2016-02-14 MED ORDER — ONDANSETRON HCL 4 MG/2ML IJ SOLN: 4.0000 mg | Freq: Once | INTRAMUSCULAR | Status: DC | PRN

## 2016-02-14 MED ORDER — PHENYLEPHRINE HCL 10 MG/ML IJ SOLN
INTRAMUSCULAR | Status: DC | PRN
  Administered 2016-02-14: 80 ug via INTRAVENOUS

## 2016-02-14 MED ORDER — FENTANYL CITRATE (PF) 100 MCG/2ML IJ SOLN: 25.0000 ug | INTRAMUSCULAR | Status: DC | PRN

## 2016-02-14 MED ORDER — DILTIAZEM HCL ER COATED BEADS 120 MG PO CP24
120.0000 mg | ORAL_CAPSULE | Freq: Every day | ORAL | Status: DC
Start: 2016-02-14 — End: 2016-02-17
  Administered 2016-02-14 – 2016-02-17 (×4): 120 mg via ORAL
  Filled 2016-02-14 (×4): qty 1

## 2016-02-14 MED ORDER — LACTATED RINGERS IV SOLN
INTRAVENOUS | Status: DC | PRN
  Administered 2016-02-14: 12:00:00 via INTRAVENOUS

## 2016-02-14 NOTE — Anesthesia Preprocedure Evaluation (Addendum)
Anesthesia Evaluation  Patient identified by MRN, date of birth, ID band Patient awake    Reviewed: Allergy & Precautions, NPO status , Patient's Chart, lab work & pertinent test results  History of Anesthesia Complications Negative for: history of anesthetic complications  Airway Mallampati: II  TM Distance: >3 FB Neck ROM: Full    Dental no notable dental hx. (+) Teeth Intact, Dental Advisory Given   Pulmonary sleep apnea and Continuous Positive Airway Pressure Ventilation ,    breath sounds clear to auscultation       Cardiovascular hypertension, Pt. on medications and Pt. on home beta blockers (-) angina+ CAD and + CABG  + dysrhythmias Atrial Fibrillation  Rhythm:Irregular Rate:Normal  12/24/15 ECHO: EF 60-65%, normal LVF, valves OK   Neuro/Psych negative neurological ROS     GI/Hepatic negative GI ROS, Neg liver ROS, GERD  Medicated and Poorly Controlled,  Endo/Other  Morbid obesity  Renal/GU Renal InsufficiencyRenal disease (creat 1.34)     Musculoskeletal   Abdominal (+) + obese,   Peds  Hematology  (+) Blood dyscrasia (plt 95K, eliquis), ,   Anesthesia Other Findings Off pump cabg on 12/26/2015  Reproductive/Obstetrics                          Anesthesia Physical  Anesthesia Plan  ASA: III  Anesthesia Plan: MAC and General   Post-op Pain Management:    Induction: Intravenous  Airway Management Planned: Oral ETT  Additional Equipment:   Intra-op Plan:   Post-operative Plan: Extubation in OR  Informed Consent: I have reviewed the patients History and Physical, chart, labs and discussed the procedure including the risks, benefits and alternatives for the proposed anesthesia with the patient or authorized representative who has indicated his/her understanding and acceptance.   Dental advisory given  Plan Discussed with: CRNA, Anesthesiologist and Surgeon  Anesthesia Plan  Comments: (Plan routine monitors, GETA  (poorly controlled GERD) )       Anesthesia Quick Evaluation

## 2016-02-14 NOTE — Progress Notes (Addendum)
Patient Name: Sean [name] Date of Encounter: 02/14/2016   Primary Cardiologist: Dr Meda Coffee Patient Profile: 45 y.o. 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 D/c 12/31/2015 after CABG w/ LIMA-LAD. Gout flare since d/c.   Pt admitted 03/14 w/ atrial fib/aflutter, RVR  SUBJECTIVE  Had rough night. Overnight nausea resolved with meds.  No chest pain, sob or palpitations.   CURRENT MEDS . amiodarone  150 mg Intravenous Once  . apixaban  5 mg Oral BID  . aspirin  81 mg Oral Daily  . digoxin  0.125 mg Intravenous Daily  . pantoprazole  20 mg Oral Daily  . rosuvastatin  40 mg Oral q1800   . sodium chloride 20 mL/hr at 02/12/16 2200  . amiodarone 30 mg/hr (02/14/16 0842)  . diltiazem (CARDIZEM) infusion 5 mg/hr (02/13/16 2226)   OBJECTIVE  Filed Vitals:   02/14/16 0300 02/14/16 0400 02/14/16 0500 02/14/16 0600  BP: 124/85 120/81 112/73 108/82  Pulse: 133 127 133 121  Temp:    98.4 F (36.9 C)  TempSrc:    Oral  Resp: 19 23 15 20   Height:      Weight:    328 lb 11.2 oz (149.097 kg)  SpO2: 89% 90% 96% 94%    Intake/Output Summary (Last 24 hours) at 02/14/16 0901 Last data filed at 02/14/16 0600  Gross per 24 hour  Intake 1763.63 ml  Output   1200 ml  Net 563.63 ml   Filed Weights   02/12/16 2100 02/13/16 0400 02/14/16 0600  Weight: 323 lb 10.2 oz (146.8 kg) 324 lb 12.8 oz (147.328 kg) 328 lb 11.2 oz (149.097 kg)    PHYSICAL EXAM  General: Pleasant, NAD. Neuro: Alert and oriented X 3. Moves all extremities spontaneously. Psych: Normal affect. HEENT:  Normal  Neck: Supple without bruits or JVD. Lungs:  Resp regular and unlabored, CTA. Heart: Regular tachycardiac  no s3, s4, or murmurs. Abdomen: Soft, non-tender, non-distended,  BS + x 4.  Extremities: No clubbing, cyanosis or edema. DP/PT/Radials 2+ and equal bilaterally.  Accessory Clinical Findings  CBC  Recent Labs  02/11/16 2233  02/13/16 0420 02/14/16 0830  WBC 11.9*  < > 9.2 9.5  NEUTROABS 6.9  --   --   --   HGB 12.3*  < > 11.9* 12.8*  HCT 39.7  < > 38.6* 40.5  MCV 94.3  < > 94.6 94.4  PLT 108*  < > 99* 95*  < > = values in this interval not displayed. Basic Metabolic Panel  Recent Labs  02/11/16 2318 02/12/16 0405 02/13/16 0420  NA  --  143 141  K  --  3.5 3.4*  CL  --  107 104  CO2  --  24 26  GLUCOSE  --  119* 136*  BUN  --  15 16  CREATININE  --  1.26* 1.46*  CALCIUM  --  9.0 9.0  MG 1.8  --   --    Liver Function Tests  Recent Labs  02/11/16 2233  AST 29  ALT 30  ALKPHOS 269*  BILITOT 0.7  PROT 6.8  ALBUMIN 2.8*    Recent Labs  02/11/16 2235  TROPONINI 0.03   Thyroid Function Tests  Recent Labs  02/12/16 0405  TSH 3.432    TELE  Aflutter  at rate of 110-120s  Radiology/Studies  Dg Chest 2 View  01/28/2016  CLINICAL DATA:  45 year old male with history of CABG. Anterior chest tightness. Bradycardia. EXAM: CHEST  2 VIEW COMPARISON:  Chest x-ray 12/29/2015. FINDINGS: Chronic elevation of left hemidiaphragm. No acute consolidative airspace disease. No pleural effusions. No evidence of pulmonary edema. Cardiomegaly. Mild rightward shift of cardiac structures secondary to chronically elevated left hemidiaphragm. Upper mediastinal contours are within normal limits. Status post median sternotomy. IMPRESSION: 1. No radiographic evidence of acute cardiopulmonary disease. 2. Chronic elevation of left hemidiaphragm. Electronically Signed   By: Vinnie Langton M.D.   On: 01/28/2016 14:18   Dg Chest Port 1 View  02/11/2016  CLINICAL DATA:  Acute onset of shortness of breath, tachycardia and indigestion. Nausea. Initial encounter. EXAM: PORTABLE CHEST 1 VIEW COMPARISON:  Chest radiograph performed 01/28/2016 FINDINGS:  There is elevation of the left hemidiaphragm, with mild left basilar atelectasis. There is no evidence of pleural effusion or pneumothorax. The cardiomediastinal silhouette is mildly enlarged. The patient is status post median sternotomy. No acute osseous abnormalities are seen. IMPRESSION: Elevation of the left hemidiaphragm, with mild left basilar atelectasis. Mild cardiomegaly. Electronically Signed   By: Garald Balding M.D.   On: 02/11/2016 22:59    ASSESSMENT AND PLAN  1. Atrial flutter with rapid ventricular response (HCC) - didn't tolerate cardizem drip initially in ER as he became hypotensive, it seems that there is clear beginning of arrhythmia yesterday after use of nasal decongestant. Denies DCCV in ER.  - Rate is still tachycardic on digoxin, IV amio and IV cardizem.  Plan for TEE/DCCV today. Eliquis for anticoagulation. TSH normal.   2. Essential hypertension - BP improved  3. OSA (obstructive sleep apnea) - On CPAP  4. HL - Continue statin.     5. CAD s/p CABG x1V (12/2015):  - Not a candidate for DAPT due to chronic thrombocytopenia  6. CKD, stage II-III - Baseline creatinine around 1.4. Pending BMET this morning.   Signed, Leanor Kail PA-C Pager [phone]  The patient was seen, examined and discussed with Bhagat,Bhavinkumar PA-C and I agree with the above.   45 year old male post recent CABD x 1, admitted yesterday with palpitations, SOB, found to be in atrial flutter with RVR, didn't tolerate cardizem drip as he became hypotensive, since he reported that he might have been feeling like this for a week we started on anticoagulation, he underwent a successful TEE/DCCV today. We will discontinue amiodarone drip, digoxin and Cardizem drip. We will start Cardizem CD 120 mg daily. He'll keep him overnight and recheck his creatinine as his acute on chronic kidney failure. If he has recurrent atrial flutter will refer to EP for possible ablation. The patient has  nonfunctioning C Pap machine at home and was recommended to undergo another study we'll refer him at discharge. He is also asking for prescription for medication for recurring gout.   Dorothy Spark 02/14/2016

## 2016-02-14 NOTE — Progress Notes (Signed)
  Echocardiogram Echocardiogram Transesophageal has been performed.  Bobbye [place] 02/14/2016, 12:46 PM

## 2016-02-14 NOTE — H&P (Signed)
     INTERVAL PROCEDURE H&P  History and Physical Interval Note:  02/14/2016 11:58 AM  Sean [name] has presented today for their planned procedure. The various methods of treatment have been discussed with the patient and family. After consideration of risks, benefits and other options for treatment, the patient has consented to the procedure.  The patients' outpatient history has been reviewed, patient examined, and no change in status from most recent office note within the past 30 days. I have reviewed the patients' chart and labs and will proceed as planned. Questions were answered to the patient's satisfaction.   Pixie Casino, MD, [facility] Attending Cardiologist [place] Tamica Covell 02/14/2016, 11:58 AM

## 2016-02-14 NOTE — Anesthesia Postprocedure Evaluation (Signed)
Anesthesia Post Note  Patient: Sean [name]  Procedure(s) Performed: Procedure(s) (LRB): CARDIOVERSION (N/A) TRANSESOPHAGEAL ECHOCARDIOGRAM (TEE) (N/A)  Patient location during evaluation: PACU Anesthesia Type: MAC Level of consciousness: awake and alert, oriented and patient cooperative Pain management: pain level controlled Vital Signs Assessment: post-procedure vital signs reviewed and stable Respiratory status: respiratory function stable, nonlabored ventilation and spontaneous breathing Cardiovascular status: blood pressure returned to baseline and stable Postop Assessment: no signs of nausea or vomiting Anesthetic complications: no    Last Vitals:  Filed Vitals:   02/14/16 1345 02/14/16 1400  BP: 126/80 120/63  Pulse: 80 79  Temp:    Resp: 25 13    Last Pain:  Filed Vitals:   02/14/16 1404  PainSc: 1                  Mykle Pascua,E. Anjolaoluwa Siguenza

## 2016-02-14 NOTE — Anesthesia Procedure Notes (Signed)
Procedure Name: Intubation Date/Time: 02/14/2016 12:34 PM Performed by: Shirlyn Goltz Pre-anesthesia Checklist: Emergency Drugs available, Patient identified, Timeout performed, Suction available and Patient being monitored Patient Re-evaluated:Patient Re-evaluated prior to inductionOxygen Delivery Method: Circle system utilized Preoxygenation: Pre-oxygenation with 100% oxygen Intubation Type: IV induction and Cricoid Pressure applied Laryngoscope Size: Glidescope Tube type: Oral Tube size: 7.5 mm Number of attempts: 1 Airway Equipment and Method: Video-laryngoscopy Placement Confirmation: breath sounds checked- equal and bilateral,  ETT inserted through vocal cords under direct vision and positive ETCO2 Tube secured with: Tape Dental Injury: Teeth and Oropharynx as per pre-operative assessment

## 2016-02-14 NOTE — CV Procedure (Signed)
TEE/CARDIOVERSION NOTE  TRANSESOPHAGEAL ECHOCARDIOGRAM (TEE):  Indictation: Atrial Flutter  Consent:   Informed consent was obtained prior to the procedure. The risks, benefits and alternatives for the procedure were discussed and the patient comprehended these risks.  Risks include, but are not limited to, cough, sore throat, vomiting, nausea, somnolence, esophageal and stomach trauma or perforation, bleeding, low blood pressure, aspiration, pneumonia, infection, trauma to the teeth and death.    Time Out: Verified patient identification, verified procedure, site/side was marked, verified correct patient position, special equipment/implants available, medications/allergies/relevent history reviewed, required imaging and test results available. Performed  Procedure:  After a procedural time-out, the patient was intubated and induced for general anesthesia per anesthesiology service. See their separate note.  The oropharynx was anesthetized with topical cetacaine spray.  The transesophageal probe was inserted in the esophagus and stomach without difficulty and multiple views were obtained.  The patient was kept under observation until the patient left the procedure room.  The patient left the procedure room in stable condition.   Agitated microbubble saline contrast was administered.  Complications:    Complications: None Patient did tolerate procedure well.  Findings:  1. LEFT VENTRICLE: The left ventricular wall thickness is mildly increased.  The left ventricular cavity is normal in size. Wall motion is hypokinetic.  LVEF is 35-40%.  2. RIGHT VENTRICLE:  The right ventricle is normal in structure and function without any thrombus or masses.    3. LEFT ATRIUM:  The left atrium is dilated in size without any thrombus or masses.  There is not spontaneous echo contrast ("smoke") in the left atrium consistent with a low flow state.  4. LEFT ATRIAL APPENDAGE:  The left atrial  appendage is free of any thrombus or masses. The appendage has single lobes and is broccoli shaped. Pulse doppler indicates high flow in the appendage.  5. ATRIAL SEPTUM:  The atrial septum is aneurysmal, however, there is no evidence for interatrial shunting by color doppler and saline microbubble.  6. RIGHT ATRIUM:  The right atrium is normal in size and function without any thrombus or masses.  7. MITRAL VALVE:  The mitral valve is normal in structure and function with Mild regurgitation.  There were no vegetations or stenosis.  8. AORTIC VALVE:  The aortic valve is trileaflet, normal in structure and function with no regurgitation.  There were no vegetations or stenosis  9. TRICUSPID VALVE:  The tricuspid valve is normal in structure and function with trivial regurgitation.  There were no vegetations or stenosis  10.  PULMONIC VALVE:  The pulmonic valve is normal in structure and function with trivial regurgitation.  There were no vegetations or stenosis.   11. AORTIC ARCH, ASCENDING AND DESCENDING AORTA:  There was grade 1 Ron Parker et. Al, 1992) atherosclerosis of the ascending aorta, aortic arch, or proximal descending aorta.  12. PULMONARY VEINS: Anomalous pulmonary venous return was not noted.  13. PERICARDIUM: The pericardium appeared normal and non-thickened.  There is a Small posterior pericardial effusion.  CARDIOVERSION:     Second Time Out: Verified patient identification, verified procedure, site/side was marked, verified correct patient position, special equipment/implants available, medications/allergies/relevent history reviewed, required imaging and test results available.  Performed  Procedure:  1. Patient placed on cardiac monitor, pulse oximetry, supplemental oxygen as necessary.  2. Sedation administered per anesthesia 3. Pacer pads placed anterior and posterior chest. 4. Cardioverted 2 time(s).  5. Cardioverted at 150J and 200J (251J delivered)  biphasic.  Complications:  Complications:  None Patient did tolerate procedure well.  Impression:  1. No LAA thrombus 2. Negative PFO - although, atrial septum is aneurysmal 3. Mild MR 4. LVEF 35-40% with global hypokinesis 5. Small, mostly posterior pericardial effusion 6. Successful DCCV to NSR  Recommendations:  1. Patient will recover from general anesthesia in the PACU before returning to the floor bed. 2. Would recommend discontinuing the diltiazem gtts, but continue amiodarone infusion.  Time Spent Directly with the Patient:  60 minutes   Pixie Casino, MD, [facility] Attending Cardiologist [facility]  02/14/2016, 1:01 PM

## 2016-02-14 NOTE — Progress Notes (Signed)
Pt. placed on cpap with small nasal mask on room air, tolerating well.

## 2016-02-14 NOTE — Transfer of Care (Signed)
Immediate Anesthesia Transfer of Care Note  Patient: Sean [name]  Procedure(s) Performed: Procedure(s): CARDIOVERSION (N/A) TRANSESOPHAGEAL ECHOCARDIOGRAM (TEE) (N/A)  Patient Location: PACU  Anesthesia Type:General  Level of Consciousness: awake, alert , oriented and patient cooperative  Airway & Oxygen Therapy: Patient Spontanous Breathing and Patient connected to face mask oxygen  Post-op Assessment: Report given to RN, Post -op Vital signs reviewed and stable and Patient moving all extremities X 4  Post vital signs: Reviewed and stable  Last Vitals:  Filed Vitals:   02/14/16 0600 02/14/16 1103  BP: 108/82 100/64  Pulse: 121 108  Temp: 36.9 C 36.7 C  Resp: 20 16    Complications: No apparent anesthesia complications

## 2016-02-15 ENCOUNTER — Encounter (HOSPITAL_COMMUNITY): Payer: Self-pay | Admitting: Internal Medicine

## 2016-02-15 DIAGNOSIS — I2510 Atherosclerotic heart disease of native coronary artery without angina pectoris: Secondary | ICD-10-CM

## 2016-02-15 MED ORDER — NITROGLYCERIN 0.4 MG SL SUBL: 0.4000 mg | SUBLINGUAL_TABLET | SUBLINGUAL | Status: DC | PRN

## 2016-02-15 MED ORDER — APIXABAN 5 MG PO TABS: 5.0000 mg | ORAL_TABLET | Freq: Two times a day (BID) | ORAL | Status: DC

## 2016-02-15 MED ORDER — ASPIRIN 81 MG PO TABS: 81.0000 mg | ORAL_TABLET | Freq: Every day | ORAL | Status: DC

## 2016-02-15 MED ORDER — DILTIAZEM HCL ER COATED BEADS 120 MG PO CP24: 120.0000 mg | ORAL_CAPSULE | Freq: Every day | ORAL | Status: DC

## 2016-02-15 MED ORDER — ALLOPURINOL 100 MG PO TABS
100.0000 mg | ORAL_TABLET | Freq: Every day | ORAL | Status: DC
  Administered 2016-02-15 – 2016-02-17 (×3): 100 mg via ORAL
  Filled 2016-02-15 (×3): qty 1

## 2016-02-15 MED ORDER — ALLOPURINOL 100 MG PO TABS: 100.0000 mg | ORAL_TABLET | Freq: Every day | ORAL | Status: DC

## 2016-02-15 NOTE — Progress Notes (Signed)
Pt has discharges orders but he is complaining of feeling very nauseated , weak and has an Headache, I gave him Norco 5-325 two tablet and Zofran IV with some improvement. Pt still feels like he needs to stay here one more night because of the above symptoms. MD got paged.   [name], RN

## 2016-02-15 NOTE — Progress Notes (Signed)
Patient Saturations on Room Air at Rest = 97 %  Patient Saturations on Room Air while Ambulating = 85 %  Patient Saturations on 3 Liters of oxygen while Ambulating = 91 %   Underlying issue of deconditioning. Need to be more active. Also has non functioning CPAP. Will place order for home oxygen until sleep study.   Maia Handa, [place]

## 2016-02-15 NOTE — Progress Notes (Signed)
Report updated in patient's room via Merleen Nicely RN using [employer], discussed events of the day, new orders, VS, med changes and general condition of the patient, assumed care of patient

## 2016-02-15 NOTE — Care Management Note (Addendum)
Case Management Note  Patient Details  Name: Sean [name] MRN: [id number] Date of Birth: 12/12/1970  Subjective/Objective:  Pt admitted for A flutter. Pt is from home with family support. Pt has RW at home. Hx Gout. No HH services needed at this time.                    Action/Plan:Plan will be for d/c home today with 02 via [facility]-choice provided and family agreeable. Referral made to [facility] with [facility]. DME ot be delivered to room before d/c. CM did provide pt with 30 day free Eliquis card. CM did call [facility] and medication is available at no cost. No further needs from CM at this time.    Expected Discharge Date:                  Expected Discharge Plan:  Home/Self Care  In-House Referral:  NA  Discharge planning Services  CM Consult  Post Acute Care Choice:  Durable Medical Equipment Choice offered to:  Patient, Parent  DME Arranged:  Oxygen DME Agency:  [place]:  NA [place] Agency:  NA  Status of Service:  Completed, signed off  Medicare Important Message Given:    Date Medicare IM Given:    Medicare IM give by:    Date Additional Medicare IM Given:    Additional Medicare Important Message give by:     If discussed at [place] of Stay Meetings, dates discussed:    Additional Comments:  Bethena Roys, RN 02/15/2016, 1:34 PM

## 2016-02-15 NOTE — Discharge Summary (Signed)
Discharge Summary    Patient ID: Sean [name],  MRN: [id number], DOB/AGE: February 01, 1971 45 y.o.  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 LEFT VENTRICLE: The left ventricular wall thickness is mildly increased. The left ventricular cavity is normal in size. Wall motion is hypokinetic. LVEF is 35-40%.  2. RIGHT VENTRICLE: The right ventricle is normal in structure and function without any thrombus or masses.  3. LEFT ATRIUM: The left atrium is dilated in size without any thrombus or masses. There is not spontaneous echo contrast ("smoke") in the left atrium consistent with a low flow state.  4. LEFT ATRIAL APPENDAGE: The left atrial appendage is free of any thrombus or masses. The appendage has single lobes and is broccoli shaped. Pulse doppler indicates high flow in the appendage.  5. ATRIAL SEPTUM: The atrial septum is aneurysmal, however, there is no evidence for interatrial shunting by color doppler and saline microbubble.  6. RIGHT ATRIUM: The right atrium is normal in size and function without any thrombus or masses.  7. MITRAL VALVE: The mitral valve is normal in structure and function with Mild regurgitation. There were no vegetations or stenosis.  8. AORTIC VALVE: The aortic valve is trileaflet, normal in structure and function with no regurgitation. There were no vegetations or stenosis  9. TRICUSPID VALVE: The tricuspid valve is normal in structure and function with trivial  regurgitation. There were no vegetations or stenosis  10. PULMONIC VALVE: The pulmonic valve is normal in structure and function with trivial regurgitation. There were no vegetations or stenosis.  11. AORTIC ARCH, ASCENDING AND DESCENDING AORTA: There was grade 1 Ron Parker et. Al, 1992) atherosclerosis of the ascending aorta, aortic arch, or proximal descending aorta.  12. PULMONARY VEINS: Anomalous pulmonary venous return was not noted.  13. PERICARDIUM: The pericardium appeared normal and non-thickened. There is a Small posterior pericardial effusion.  CARDIOVERSION:   Second Time Out: Verified patient identification, verified procedure, site/side was marked, verified correct patient position, special equipment/implants available, medications/allergies/relevent history reviewed, required imaging and test results available. Performed  Procedure:  1. Patient placed on cardiac monitor, pulse oximetry, supplemental oxygen as necessary.  2. Sedation administered per anesthesia 3. Pacer pads placed anterior and posterior chest. 4. Cardioverted 2 time(s).  5. Cardioverted at 150J and 200J (251J delivered) biphasic.  Complications:  Complications: None Patient did tolerate procedure well.  Impression:  1. No LAA thrombus 2. Negative PFO - although, atrial septum is aneurysmal 3. Mild MR 4. LVEF 35-40% with global hypokinesis 5. Small, mostly posterior pericardial effusion 6. Successful DCCV to NSR  Recommendations:  1. Patient will recover from general anesthesia in the PACU before returning to the floor bed. 2. Would recommend discontinuing the diltiazem gtts, but continue amiodarone infusion.   History of Present Illness      45 y.o. 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 D/c 12/31/2015 after CABG w/ LIMA-LAD.  Gout flare since d/c.  who presented 02/12/16 with  palpitations and fatigue.  Pt recently diagnosed with 1vCAD and LIMA->LAD 12/2015. Unremarkable post-op course and d/c'ed after 5 days. Subsequently had a gout flare managed with indomethacin. For the past few days has been having URI type symptoms. Complains of rhinorrhea and cough productive of sputum. Minimal SOB. Took some OTC phenylephrine and Benadryl. Woke up this AM with heart racing. Pulse ~140 bpm all day. Felt somewhat fatigued, SOB, and presyncopal w/ ambulation but asymptomatic at rest. Presented to ER and had afluter on EKG and neg trop. Stable CKD. CXR unchanged from prior. Received IV dilt bolus/gtt and IV metop and became hypotensive without any improvement in HR.   Hospital Course     Consultants: None  1. Atrial flutter with rapid ventricular response (HCC) - Didn't tolerate cardizem drip initially in ER as he became hypotensive, it seems that there is clear beginning of arrhythmia yesterday after use of nasal decongestant. Denies DCCV in ER. TSH normal.  -Rate was not controlled on digoxin, IV amio and IV cardizem. Subsequently underwent TEE that showed no LAA thrombus, no PFO, mild MR, LVEF of 35-40%, small posterior pericardial effusion. S/p successful Cardioverted at 150J and 200J (251J delivered) biphasic. - Eliquis for anticoagulation. Rate was stable on cardizem CD 120. Maintained sinus rhythm. Dr. Meda Coffee did not think that he will need amiodarone load given age.   - If he has recurrent atrial flutter will refer to EP for possible ablation.  2. Essential hypertension - Elevated at presentation. BP improved and stable.   3. OSA (obstructive sleep apnea) - Nonfunctioning C Pap machine at home and was recommended to undergo another study we'll refer him at discharge.  4. HL - Continue statin.   5. 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   6. CKD, stage II-III - Baseline creatinine around 1.4. Creatinine stable.   7. Hyperglycemia - Hgb of 5.6. Advised life style changes.   8. Recurring gout - - Started him on allopurinol. F/u with PCP.   The patient has been seen by Dr. Meda Coffee  today and deemed ready for discharge home. All follow-up appointments have been scheduled. Discharge medications are listed below.   Discharge Vitals Blood pressure 116/71, pulse 75, temperature 98.4 F (36.9 C), temperature source Oral, resp. rate 20, height 5\' 11"  (1.803 m), weight 331 lb 11.2 oz (150.458 kg), SpO2 90 %.  Filed Weights   02/13/16 0400 02/14/16 0600 02/15/16 0551  Weight: 324 lb 12.8 oz (147.328 kg) 328 lb 11.2 oz (149.097 kg) 331 lb 11.2 oz (150.458 kg)    Labs & Radiologic Studies     CBC  Recent Labs  02/13/16 0420 02/14/16 0830  WBC 9.2 9.5  HGB 11.9* 12.8*  HCT 38.6* 40.5  MCV 94.6 94.4  PLT 99* 95*   Basic Metabolic Panel  Recent Labs  02/13/16 0420 02/14/16 0830  NA 141 139  K 3.4* 4.3  CL 104 105  CO2 26 24  GLUCOSE 136* 149*  BUN 16 10  CREATININE 1.46* 1.34*  CALCIUM 9.0 9.3    Dg Chest 2 View  01/28/2016  CLINICAL DATA:  45 year old male with history of CABG. Anterior chest tightness. Bradycardia. EXAM: CHEST  2 VIEW COMPARISON:  Chest x-ray 12/29/2015. FINDINGS: Chronic elevation of left hemidiaphragm. No acute consolidative airspace disease. No pleural effusions. No evidence of pulmonary edema. Cardiomegaly. Mild rightward shift of cardiac structures secondary  to chronically elevated left hemidiaphragm. Upper mediastinal contours are within normal limits. Status post median sternotomy. IMPRESSION: 1. No radiographic evidence of acute cardiopulmonary disease. 2. Chronic elevation of left hemidiaphragm. Electronically Signed   By: Vinnie Langton M.D.   On: 01/28/2016 14:18   Dg Chest Port 1 View  02/11/2016  CLINICAL DATA:  Acute onset of shortness of breath,  tachycardia and indigestion. Nausea. Initial encounter. EXAM: PORTABLE CHEST 1 VIEW COMPARISON:  Chest radiograph performed 01/28/2016 FINDINGS: There is elevation of the left hemidiaphragm, with mild left basilar atelectasis. There is no evidence of pleural effusion or pneumothorax. The cardiomediastinal silhouette is mildly enlarged. The patient is status post median sternotomy. No acute osseous abnormalities are seen. IMPRESSION: Elevation of the left hemidiaphragm, with mild left basilar atelectasis. Mild cardiomegaly. Electronically Signed   By: Garald Balding M.D.   On: 02/11/2016 22:59    Disposition   Pt is being discharged home today in good condition.  Follow-up Plans & Appointments    Follow-up Information    Follow up with SIMMONS, BRITTAINY, PA-C. Go on 02/28/2016.   Specialties:  Cardiology, Radiology   Why:  @1 :30pm for post hospital.   Contact information:   [place] [address] [phone]       Follow up with [facility].   Specialty:  Cardiology   Why:  office will call with sleep study setup   Contact information:   [address] [place] [phone]      Follow up with [name], MD. Schedule an appointment as soon as possible for a visit in 1 week.   Specialty:  Internal Medicine   Why:  for recurrent gout.    Contact information:   [place] [address] [phone]      Discharge Instructions    Amb referral to [facility]    Complete by:  As directed      Diet - low sodium heart healthy    Complete by:  As directed      Discharge instructions    Complete by:  As directed   F/u with PCP for reoccurring gout. Avoid NASAID like ibuprofen, aleve etc to reduce risk of bleeding.     Increase activity slowly    Complete by:  As directed            Discharge Medications   Current Discharge Medication List    START taking these medications    Details  allopurinol (ZYLOPRIM) 100 MG tablet Take 1 tablet (100 mg total) by mouth daily. Qty: 30 tablet, Refills: 1    apixaban (ELIQUIS) 5 MG TABS tablet Take 1 tablet (5 mg total) by mouth 2 (two) times daily. Qty: 60 tablet, Refills: 11    diltiazem (CARDIZEM CD) 120 MG 24 hr capsule Take 1 capsule (120 mg total) by mouth daily. Qty: 30 capsule, Refills: 6    nitroGLYCERIN (NITROSTAT) 0.4 MG SL tablet Place 1 tablet (0.4 mg total) under the tongue every 5 (five) minutes as needed for chest pain. Qty: 25 tablet, Refills: 12      CONTINUE these medications which have CHANGED   Details  aspirin 81 MG tablet Take 1 tablet (81 mg total) by mouth daily.      CONTINUE these medications which have NOT CHANGED   Details  atenolol (TENORMIN) 25 MG tablet Take 0.5 tablets (12.5 mg total) by mouth 2 (two) times daily.  Qty: 30 tablet, Refills: 3    cetirizine (ZYRTEC) 10 MG tablet Take 10 mg by mouth daily.    furosemide (LASIX) 40 MG tablet Take 1 tablet (40 mg total) by mouth daily. Qty: 30 tablet, Refills: 3   Associated Diagnoses: Localized edema    omeprazole (PRILOSEC) 20 MG capsule Take 20 mg by mouth daily.    phenylephrine (SUDAFED PE) 10 MG TABS tablet Take 10 mg by mouth every 4 (four) hours as needed (for congestion).    potassium chloride (K-DUR,KLOR-CON) 10 MEQ tablet Take 2 tablets (20 mEq total) by mouth daily. Qty: 60 tablet, Refills: 3   Associated Diagnoses: Localized edema    rosuvastatin (CRESTOR) 40 MG tablet Take 1 tablet (40 mg total) by mouth daily at 6 PM. Qty: 30 tablet, Refills: 3      STOP taking these medications     indomethacin (INDOCIN) 50 MG capsule      traMADol (ULTRAM) 50 MG tablet      aspirin EC 325 MG EC tablet           Outstanding Labs/Studies   Sleep study.   Duration of Discharge Encounter   Greater than 30 minutes including physician time.  Signed, Jehieli Brassell PA-C 02/15/2016, 11:28 AM

## 2016-02-15 NOTE — Plan of Care (Signed)
Problem: Education: Goal: Knowledge of [facility] Education information/materials will improve Outcome: Progressing Reviewed general information reqarding safety on the unit, patient was instructed on the white board and how to call his RN/NT and patient uses appropriately, all personal belongings on bedside table within his reach, will continue to monitor

## 2016-02-15 NOTE — Progress Notes (Signed)
SATURATION QUALIFICATIONS: (This note is used to comply with regulatory documentation for home oxygen)  Patient Saturations on Room Air at Rest = 97 %  Patient Saturations on Room Air while Ambulating = 85 %  Patient Saturations on 3 Liters of oxygen while Ambulating = 91 %  Please briefly explain why patient needs home oxygen: Oxy sat dropped to 85 % while ambulating w/o oxygen and was improved to 91% with 3l of Oxygen.  [name], RN

## 2016-02-15 NOTE — Progress Notes (Addendum)
Patient Name: Sean [name] Date of Encounter: 02/15/2016   Primary Cardiologist: Dr Meda Coffee  Patient Profile: 45 y.o. 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 D/c 12/31/2015 after CABG w/ LIMA-LAD. Gout flare since d/c.   Pt admitted 03/14 w/ atrial fib/aflutter, RVR  SUBJECTIVE  Feeling well. No chest pain, sob or palpitations.   CURRENT MEDS . apixaban  5 mg Oral BID  . aspirin  81 mg Oral Daily  . diltiazem  120 mg Oral Daily  . pantoprazole  20 mg Oral Daily  . rosuvastatin  40 mg Oral q1800     OBJECTIVE  Filed Vitals:   02/14/16 2100 02/14/16 2300 02/15/16 0500 02/15/16 0551  BP:  140/75 132/69   Pulse: 86 73 86   Temp:   98.3 F (36.8 C)   TempSrc:   Oral   Resp: 20 19 18    Height:      Weight:    331 lb 11.2 oz (150.458 kg)  SpO2: 100% 98% 98%     Intake/Output Summary (Last 24 hours) at 02/15/16 0723 Last data filed at 02/15/16 0600  Gross per 24 hour  Intake    640 ml  Output    400 ml  Net    240 ml   Filed Weights   02/13/16 0400 02/14/16 0600 02/15/16 0551  Weight: 324 lb 12.8 oz (147.328 kg) 328 lb 11.2 oz (149.097 kg) 331 lb 11.2 oz (150.458 kg)    PHYSICAL EXAM  General: Pleasant, NAD. Neuro: Alert and oriented X 3. Moves all extremities spontaneously. Psych: Normal affect. HEENT:  Normal  Neck: Supple without bruits or JVD. Lungs:  Resp regular and unlabored, CTA. Heart: RRR.   no s3, s4, or murmurs. Abdomen: Soft, non-tender, non-distended, BS + x 4.  Extremities: No clubbing, cyanosis or edema. DP/PT/Radials 2+ and equal bilaterally.  Accessory Clinical Findings  CBC  Recent Labs  02/13/16 0420 02/14/16 0830  WBC 9.2 9.5  HGB 11.9* 12.8*  HCT 38.6* 40.5  MCV 94.6 94.4  PLT 99* 95*   Basic Metabolic Panel  Recent Labs  02/13/16 0420 02/14/16 0830  NA 141 139  K 3.4* 4.3  CL 104 105  CO2 26 24  GLUCOSE 136* 149*  BUN 16 10  CREATININE 1.46* 1.34*  CALCIUM 9.0 9.3    TELE  Aflutter at rate of 110-120s  Radiology/Studies  Dg Chest 2 View  01/28/2016  CLINICAL DATA:  45 year old male with history of CABG. Anterior chest tightness. Bradycardia. EXAM: CHEST  2 VIEW COMPARISON:  Chest x-ray 12/29/2015. FINDINGS: Chronic elevation of left hemidiaphragm. No acute consolidative airspace disease. No pleural effusions. No evidence of pulmonary edema. Cardiomegaly. Mild rightward shift of cardiac structures secondary to chronically elevated left hemidiaphragm. Upper mediastinal contours are within normal limits. Status post median sternotomy. IMPRESSION: 1. No radiographic evidence of acute cardiopulmonary disease. 2. Chronic elevation of left hemidiaphragm. Electronically Signed   By: Vinnie Langton M.D.   On: 01/28/2016 14:18   Dg Chest Port 1 View  02/11/2016  CLINICAL DATA:  Acute onset of shortness of breath, tachycardia and indigestion. Nausea. Initial encounter. EXAM: PORTABLE CHEST 1 VIEW COMPARISON:  Chest radiograph performed 01/28/2016 FINDINGS: There is elevation of the left hemidiaphragm, with mild left basilar atelectasis. There is no evidence of pleural effusion or pneumothorax. The cardiomediastinal silhouette is mildly  enlarged. The patient is status post median sternotomy. No acute osseous abnormalities are seen. IMPRESSION: Elevation of the left hemidiaphragm, with mild left basilar atelectasis. Mild cardiomegaly. Electronically Signed   By: Garald Balding M.D.   On: 02/11/2016 22:59    ASSESSMENT AND PLAN  1. Atrial flutter with rapid ventricular response (HCC) - didn't tolerate cardizem drip initially in ER as he became hypotensive, it seems that there is clear beginning of arrhythmia yesterday after use of nasal decongestant. Denies DCCV in ER. TSH normal.  -Rate was not controlled on   digoxin, IV amio and IV cardizem. Subsequently underwent TEE that showed no LAA thrombus, no PFO, mild MR, LVEF of 35-40%, small posterior pericardial effusion. S/p successful Cardioverted at 150J and 200J (251J delivered) biphasic. - Eliquis for anticoagulation. Rate was stable on cardizem CD 120. Sinus rhythm on exam, off tele overnight.  - If he has recurrent atrial flutter will refer to EP for possible ablation.  2. Essential hypertension - Elevated at presentation.  BP improved and stable.   3. OSA (obstructive sleep apnea) - On CPAP. ?nonfunctioning C Pap machine at home and was recommended to undergo another study we'll refer him at discharge.  4. HL - Continue statin.     5. 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   6. CKD, stage II-III - Baseline creatinine around 1.4. Creatinine improved to 1.34 from 1.46.   7. Hyperglycemia - Hgb of 5.6. Advised life style changes.   Dispo: ready for discharge. Once ambulate. F/u in clinic within 2 weeks. The patient currently on Eliquis 5mg , ASA 81mg  (platerles stable), Cardizem CD 120mg , Protonix 20mg  and Crestor 40mg . Home meds atenolol 12.5mg , lasix 40mg  qd and indomethacin TID PRN is on hold currently. MD to decide discharge meds.   Signed, Leanor Kail PA-C Pager [phone]  The patient was seen, examined and discussed with Bhagat,Bhavinkumar PA-C and I agree with the above.   45 year old male post recent CABD x 1, admitted yesterday with palpitations, SOB, found to be in atrial flutter with RVR, didn't tolerate cardizem drip as he became hypotensive, since he reported that he might have been feeling like this for a week we started on anticoagulation, he underwent a successful TEE/DCCV yesterday, started on Eliquis 5 mg po BID, CHDS-VACs 3. We discontinued amiodarone drip, digoxin and Cardizem drip. We will started Cardizem CD 120 mg daily. He is in SR this am, crea is improving. If he  has recurrent atrial flutter will refer to EP for possible ablation. The patient has nonfunctioning C Pap machine at home and was recommended to undergo another study we'll refer him at discharge. He is also asking for prescription for medication for recurring gout - we will start him on allopurinol.  Dorothy Spark 02/15/2016

## 2016-02-15 NOTE — Plan of Care (Signed)
Problem: Education: Goal: Knowledge of disease or condition will improve Outcome: Progressing Reviewed cardioversion, atrial fibrilation and medications associated with it, all questions answered, will continue to monitor

## 2016-02-15 NOTE — Discharge Instructions (Signed)

## 2016-02-15 NOTE — Progress Notes (Signed)
Pt. refused CPAP this evening, currently on 3 lpm n/c, family member @ bedside, RN aware.

## 2016-02-15 NOTE — Progress Notes (Signed)
Patient stated he has attempted to wear CPAP last 2 nights and did not tolerate, did not wish to continue wearing.

## 2016-02-16 LAB — GLUCOSE, CAPILLARY: Glucose-Capillary: 142 mg/dL — ABNORMAL HIGH (ref 65–99)

## 2016-02-16 NOTE — Progress Notes (Signed)
Patient still complaining of headache and jitteriness.  Richardson Dopp PA notified he discontinued discharge.  Reassess in AM.

## 2016-02-16 NOTE — Discharge Summary (Signed)
Discharge Summary    Patient ID: Sean [name],  MRN: [id number], DOB/AGE: 04-11-1971 45 y.o.  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 LEFT VENTRICLE: The left ventricular wall thickness is mildly increased. The left ventricular cavity is normal in size. Wall motion is hypokinetic. LVEF is 35-40%.  2. RIGHT VENTRICLE: The right ventricle is normal in structure and function without any thrombus or masses.  3. LEFT ATRIUM: The left atrium is dilated in size without any thrombus or masses. There is not spontaneous echo contrast ("smoke") in the left atrium consistent with a low flow state.  4. LEFT ATRIAL APPENDAGE: The left atrial appendage is free of any thrombus or masses. The appendage has single lobes and is broccoli shaped. Pulse doppler indicates high flow in the appendage.  5. ATRIAL SEPTUM: The atrial septum is aneurysmal, however, there is no evidence for interatrial shunting by color doppler and saline microbubble.  6. RIGHT ATRIUM: The right atrium is normal in size and function without any thrombus or masses.  7. MITRAL VALVE: The mitral valve is normal in structure and function with Mild regurgitation. There were no vegetations or stenosis.  8. AORTIC VALVE: The aortic valve is trileaflet, normal in structure and function with no regurgitation. There were no vegetations or stenosis  9. TRICUSPID VALVE: The tricuspid valve is normal in structure and function with trivial regurgitation.  There were no vegetations or stenosis  10. PULMONIC VALVE: The pulmonic valve is normal in structure and function with trivial regurgitation. There were no vegetations or stenosis.  11. AORTIC ARCH, ASCENDING AND DESCENDING AORTA: There was grade 1 Ron Parker et. Al, 1992) atherosclerosis of the ascending aorta, aortic arch, or proximal descending aorta.  12. PULMONARY VEINS: Anomalous pulmonary venous return was not noted.  13. PERICARDIUM: The pericardium appeared normal and non-thickened. There is a Small posterior pericardial effusion.  CARDIOVERSION:   Second Time Out: Verified patient identification, verified procedure, site/side was marked, verified correct patient position, special equipment/implants available, medications/allergies/relevent history reviewed, required imaging and test results available. Performed  Procedure:  1. Patient placed on cardiac monitor, pulse oximetry, supplemental oxygen as necessary.  2. Sedation administered per anesthesia 3. Pacer pads placed anterior and posterior chest. 4. Cardioverted 2 time(s).  5. Cardioverted at 150J and 200J (251J delivered) biphasic.  Complications:  Complications: None Patient did tolerate procedure well.  Impression:  1. No LAA thrombus 2. Negative PFO - although, atrial septum is aneurysmal 3. Mild MR 4. LVEF 35-40% with global hypokinesis 5. Small, mostly posterior pericardial effusion 6. Successful DCCV to NSR  Recommendations:  1. Patient will recover from general anesthesia in the PACU before returning to the floor bed. 2. Would recommend discontinuing the diltiazem gtts, but continue amiodarone infusion.   History of Present Illness      45 y.o. 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 D/c  12/31/2015 after CABG w/ LIMA-LAD. Gout flare  since d/c.  who presented 02/12/16 with  palpitations and fatigue.  Pt recently diagnosed with 1vCAD and LIMA->LAD 12/2015. Unremarkable post-op course and d/c'ed after 5 days. Subsequently had a gout flare managed with indomethacin. For the past few days has been having URI type symptoms. Complains of rhinorrhea and cough productive of sputum. Minimal SOB. Took some OTC phenylephrine and Benadryl. Woke up this AM with heart racing. Pulse ~140 bpm all day. Felt somewhat fatigued, SOB, and presyncopal w/ ambulation but asymptomatic at rest. Presented to ER and had afluter on EKG and neg trop. Stable CKD. CXR unchanged from prior. Received IV dilt bolus/gtt and IV metop and became hypotensive without any improvement in HR.   Hospital Course     Consultants: None  1. Atrial flutter with rapid ventricular response (HCC) - Didn't tolerate cardizem drip initially in ER as he became hypotensive, it seems that there is clear beginning of arrhythmia yesterday after use of nasal decongestant. Denies DCCV in ER. TSH normal.  -Rate was not controlled on digoxin, IV amio and IV cardizem. Subsequently underwent TEE that showed no LAA thrombus, no PFO, mild MR, LVEF of 35-40%, small posterior pericardial effusion. S/p successful Cardioverted at 150J and 200J (251J delivered) biphasic. - Eliquis for anticoagulation. Rate was stable on cardizem CD 120. Maintained sinus rhythm. Dr. Meda Coffee did not think that he will need amiodarone load given age.   - If he has recurrent atrial flutter will refer to EP for possible ablation.  2. Essential hypertension - Elevated at presentation. BP improved and stable.   3. OSA (obstructive sleep apnea) - Nonfunctioning C Pap machine at home and was recommended to undergo another study we'll refer him at discharge.  4. HL - Continue statin.   5. 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   6. CKD, stage II-III - Baseline creatinine around 1.4. Creatinine stable.   7. Hyperglycemia - Hgb of 5.6. Advised life style changes.   8. Recurring gout - - Started him on allopurinol. F/u with PCP.   9.  Malaise - - The patient was initially felt to be ready for d/c on 3/17, however he complained of general malaise both on 3/17 and 3/18 and thus remained in the hospital.  He remained in sinus rhythm and hemodynamically stable over that time.  The patient has been seen by Dr. Stanford Breed today and deemed ready for discharge home. All follow-up appointments have been scheduled. Discharge medications are listed below.   Discharge Vitals Blood pressure 122/64, pulse 76, temperature 98.7 F (37.1 C), temperature source Oral, resp. rate 22, height 5\' 11"  (1.803 m), weight 330 lb 11.2 oz (150.005 kg), SpO2 96 %.  Filed Weights   02/14/16 0600 02/15/16 0551 02/16/16 0500  Weight: 328 lb 11.2 oz (149.097 kg) 331 lb 11.2 oz (150.458 kg) 330 lb 11.2 oz (150.005 kg)    Labs & Radiologic Studies     CBC Lab Results  Component Value Date   WBC 9.5 02/14/2016   HGB 12.8* 02/14/2016   HCT 40.5 02/14/2016   MCV 94.4 02/14/2016   PLT 95* [id number]    Basic Metabolic Panel Lab Results  Component Value Date   CREATININE 1.34* 02/14/2016   BUN 10 02/14/2016   NA 139 02/14/2016   K 4.3 02/14/2016   CL 105 02/14/2016   CO2 24 02/14/2016     Dg Chest 2 View  01/28/2016  CLINICAL DATA:  45 year old male  with history of CABG. Anterior chest tightness. Bradycardia. EXAM: CHEST  2 VIEW COMPARISON:  Chest x-ray 12/29/2015. FINDINGS: Chronic elevation of left hemidiaphragm. No acute consolidative airspace disease. No pleural effusions. No evidence of pulmonary edema. Cardiomegaly. Mild rightward shift of cardiac structures secondary to chronically elevated left hemidiaphragm. Upper mediastinal contours are within normal limits. Status post median sternotomy. IMPRESSION: 1. No  radiographic evidence of acute cardiopulmonary disease. 2. Chronic elevation of left hemidiaphragm. Electronically Signed   By: Vinnie Langton M.D.   On: 01/28/2016 14:18   Dg Chest Port 1 View  02/11/2016  CLINICAL DATA:  Acute onset of shortness of breath, tachycardia and indigestion. Nausea. Initial encounter. EXAM: PORTABLE CHEST 1 VIEW COMPARISON:  Chest radiograph performed 01/28/2016 FINDINGS: There is elevation of the left hemidiaphragm, with mild left basilar atelectasis. There is no evidence of pleural effusion or pneumothorax. The cardiomediastinal silhouette is mildly enlarged. The patient is status post median sternotomy. No acute osseous abnormalities are seen. IMPRESSION: Elevation of the left hemidiaphragm, with mild left basilar atelectasis. Mild cardiomegaly. Electronically Signed   By: Garald Balding M.D.   On: 02/11/2016 22:59    Disposition   Pt is being discharged home today in good condition.  Follow-up Plans & Appointments    Follow-up Information    Follow up with SIMMONS, BRITTAINY, PA-C. Go on 02/28/2016.   Specialties:  Cardiology, Radiology   Why:  @1 :30pm for post hospital.   Contact information:   [place] [address] [phone]       Follow up with [facility].   Specialty:  Cardiology   Why:  office will call with sleep study setup   Contact information:   [address] [place] [phone]      Follow up with [name], MD. Schedule an appointment as soon as possible for a visit in 1 week.   Specialty:  Internal Medicine   Why:  for recurrent gout.    Contact information:   [address] [place] [address] [phone]       Follow up with [facility].   Why:  Oxygen   Contact information:   [place] [address] [phone]      Discharge Instructions    Amb referral to [facility]    Complete by:   As directed      Diet - low sodium heart healthy    Complete by:  As directed      Discharge instructions    Complete by:  As directed   F/u with PCP for reoccurring gout. Avoid NASAID like ibuprofen, aleve etc to reduce risk of bleeding.     Increase activity slowly    Complete by:  As directed            Discharge Medications   Current Discharge Medication List    START taking these medications   Details  allopurinol (ZYLOPRIM) 100 MG tablet Take 1 tablet (100 mg total) by mouth daily. Qty: 30 tablet, Refills: 1    apixaban (ELIQUIS) 5 MG TABS tablet Take 1 tablet (5 mg total) by mouth 2 (two) times daily. Qty: 60 tablet, Refills: 11    diltiazem (CARDIZEM CD) 120 MG 24 hr capsule Take 1 capsule (120 mg total) by mouth daily. Qty: 30 capsule, Refills: 6    nitroGLYCERIN (NITROSTAT) 0.4 MG SL tablet Place 1 tablet (0.4 mg  total) under the tongue every 5 (five) minutes as needed for chest pain. Qty: 25 tablet, Refills: 12      CONTINUE these medications which have CHANGED   Details  aspirin 81 MG tablet Take 1 tablet (81 mg total) by mouth daily.      CONTINUE these medications which have NOT CHANGED   Details  atenolol (TENORMIN) 25 MG tablet Take 0.5 tablets (12.5 mg total) by mouth 2 (two) times daily. Qty: 30 tablet, Refills: 3    cetirizine (ZYRTEC) 10 MG tablet Take 10 mg by mouth daily.    furosemide (LASIX) 40 MG tablet Take 1 tablet (40 mg total) by mouth daily. Qty: 30 tablet, Refills: 3   Associated Diagnoses: Localized edema    omeprazole (PRILOSEC) 20 MG capsule Take 20 mg by mouth daily.    potassium chloride (K-DUR,KLOR-CON) 10 MEQ tablet Take 2 tablets (20 mEq total) by mouth daily. Qty: 60 tablet, Refills: 3   Associated Diagnoses: Localized edema    rosuvastatin (CRESTOR) 40 MG tablet Take 1 tablet (40 mg total) by mouth daily at 6 PM. Qty: 30 tablet, Refills: 3      STOP taking these medications     indomethacin (INDOCIN) 50 MG capsule       phenylephrine (SUDAFED PE) 10 MG TABS tablet      traMADol (ULTRAM) 50 MG tablet      aspirin EC 325 MG EC tablet           Outstanding Labs/Studies   Sleep study.   Duration of Discharge Encounter   Greater than 30 minutes including physician time.  Signed, Murray Hodgkins, NP 02/17/2016, 9:43 AM

## 2016-02-16 NOTE — Progress Notes (Addendum)
    Subjective:  Denies CP or dyspnea; Complains of headache and nausea. Generalized weakness.   Objective:  Filed Vitals:   02/15/16 1735 02/16/16 0500 02/16/16 0742 02/16/16 0942  BP: 128/66 132/83 128/62 122/66  Pulse: 73 88 69   Temp: 98.2 F (36.8 C) 98.7 F (37.1 C) 98.8 F (37.1 C)   TempSrc: Oral Oral Oral   Resp:  18 20   Height:      Weight:  330 lb 11.2 oz (150.005 kg)    SpO2: 100% 98% 98%     Intake/Output from previous day:  Intake/Output Summary (Last 24 hours) at 02/16/16 1133 Last data filed at 02/16/16 0818  Gross per 24 hour  Intake    720 ml  Output    850 ml  Net   -130 ml    Physical Exam: Physical exam: Well-developed obesein no acute distress.  Skin is warm and dry.  HEENT is normal.  Neck is supple.  Chest is clear to auscultation with normal expansion.  Cardiovascular exam is regular rate and rhythm.  Abdominal exam nontender or distended. No masses palpated. Extremities show no edema. neuro grossly intact    Lab Results: Basic Metabolic Panel:  Recent Labs  02/14/16 0830  NA 139  K 4.3  CL 105  CO2 24  GLUCOSE 149*  BUN 10  CREATININE 1.34*  CALCIUM 9.3   CBC:  Recent Labs  02/14/16 0830  WBC 9.5  HGB 12.8*  HCT 40.5  MCV 94.4  PLT 95*    Assessment/Plan:  1 atrial flutter-status post TEE guided cardioversion -continue Cardizem and apixaban. Patient remains in sinus on exam. 2 cardiomyopathy-LV function reduced. Possibly secondary to atrial flutter and tachycardia. Previous echocardiogram showed normal LV function. He will need a repeat study in 8-12 weeks to see if LV function normalized. 3 headache/nausea-question viral syndrome. No neurological deficits. 4 coronary artery disease-continue aspirin and statin. 5 chronic stage III renal disease-creatinine stable. 6 hyperlipidemia-continue statin. 7 possible obstructive sleep apnea-patient developed some hypoxia off of oxygen yesterday. Home oxygen has been  arranged. Sleep study as an outpatient. Patient has general malaise, headache and nausea.He will ambulate and we will reassess later this afternoon. Discharge to feeling better. Otherwise will keep in-house. Kirk Ruths 02/16/2016, 11:33 AM

## 2016-02-16 NOTE — Progress Notes (Signed)
  Called by RN. Patient ok for DC today if feeling ok.  Kept yesterday due to malaise, HA. Still feeling poorly and too anxious to go home. Will keep overnight and try to get home tomorrow. Richardson Dopp, PA-C   02/16/2016 3:57 PM

## 2016-02-17 NOTE — Progress Notes (Signed)
Pt c/o of R frotal headache, being jittery . Further c/o of shakiiness on both upper extremities. Fine crackles on L mid lung area, RLLL. Will continue to observe and evaluate and refer accordingly.

## 2016-02-17 NOTE — Progress Notes (Signed)
    Subjective:  Denies CP or dyspnea; complains of mild headache.   Objective:  Filed Vitals:   02/16/16 1927 02/16/16 2352 02/17/16 0400 02/17/16 0825  BP: 129/61 122/61 130/68 122/64  Pulse: 74 71 76   Temp: 98.4 F (36.9 C) 99.2 F (37.3 C) 98.7 F (37.1 C)   TempSrc: Oral Oral Oral   Resp: 20 22 22    Height:      Weight:      SpO2: 100% 97% 96%     Intake/Output from previous day:  Intake/Output Summary (Last 24 hours) at 02/17/16 0912 Last data filed at 02/16/16 2300  Gross per 24 hour  Intake    480 ml  Output    550 ml  Net    -70 ml    Physical Exam: Physical exam: Well-developed obese in no acute distress.  Skin is warm and dry.  HEENT is normal.  Neck is supple.  Chest is clear to auscultation with normal expansion.  Cardiovascular exam is regular rate and rhythm.  Abdominal exam nontender or distended. No masses palpated. Extremities show no edema. neuro grossly intact   Assessment/Plan:  1 atrial flutter-status post TEE guided cardioversion -continue Cardizem and apixaban. Patient remains in sinus on telemetry (personally reviewed). 2 cardiomyopathy-LV function reduced. Possibly secondary to atrial flutter and tachycardia. Previous echocardiogram showed normal LV function. He will need a repeat study in 8-12 weeks to see if LV function normalized. If not, will need adjustment of meds to include beta blocker and ACEI 3 headache/nausea-question viral syndrome. No neurological deficits. Some improvement this AM. 4 coronary artery disease-continue aspirin and statin. 5 chronic stage III renal disease-creatinine stable. DC indocin. 6 hyperlipidemia-continue statin. 7 possible obstructive sleep apnea-patient developed some hypoxia off of oxygen yesterday. Home oxygen has been arranged. Sleep study as an outpatient. DC today and FU Dr Meda Coffee > 30 min PA and physician time D2  Kirk Ruths 02/17/2016, 9:12 AM

## 2016-02-18 ENCOUNTER — Telehealth: Payer: Self-pay | Admitting: Cardiology

## 2016-02-18 NOTE — Telephone Encounter (Signed)
NeW Message  Pt mother- calling to speak w/ RN- stated that he was having "jitters" and noes bleeds while pt was on O2, but it was relieved when he stopped to O2. Pt mother wanted to know what to do going forward.

## 2016-02-18 NOTE — Telephone Encounter (Signed)
Pt and mother calling to inform that the pt was recently discharged from the hospital on home O2 and his nose has been slightly bleeding when he blows it, for the inside of his nose is really dry.  Pts mother was unable to tell who the ordering MD for his home O2 was, but I did notate in the pts chart that his DME is [facility] in [facility], and he can call that number provided on his discharge instructions, to request a humidifier piece be ordered for his O2 to relieve nasal passage dryness.  Also encouraged the pt and his mother that they should contact the pts PCP Dr Bea Graff, for his discharge instructions state that he will need to be seen by Dr Bea Graff (pts PCP) for sometime this week for hospital follow-up.  Informed the pt and mother that we will see the pt in our office for post-hospital follow-up with Ellen Henri PA-C on 02/28/16, and our sleep study Representative Talbert Cage, will be calling the pt back, to have a repeat sleep study done on the pt, for OSA.  Both verbalized understanding and agree with the plan mentioned above.  Both state they will call DME [facility] now to request a humidifier be added to his O2, and call the pts PCP to schedule an appt for this week.  Will send this message to [place] PA-C as an fyi, for she will be seeing him for post-hospital f/u on 02/28/16.

## 2016-02-19 DIAGNOSIS — G4733 Obstructive sleep apnea (adult) (pediatric): Secondary | ICD-10-CM | POA: Insufficient documentation

## 2016-02-19 DIAGNOSIS — Z79899 Other long term (current) drug therapy: Secondary | ICD-10-CM | POA: Insufficient documentation

## 2016-02-25 NOTE — Addendum Note (Signed)
Encounter addended by: Dorothy Spark, MD on: 02/25/2016 10:31 AM<BR>     Documentation filed: Notes Section

## 2016-02-25 NOTE — Discharge Summary (Signed)
This is an addendum to the discharge summary:  CHADS-VASc 2  Dorothy Spark 02/25/2016

## 2016-02-26 ENCOUNTER — Emergency Department (HOSPITAL_COMMUNITY): Payer: BLUE CROSS/BLUE SHIELD

## 2016-02-26 ENCOUNTER — Telehealth: Payer: Self-pay | Admitting: Cardiology

## 2016-02-26 ENCOUNTER — Encounter (HOSPITAL_COMMUNITY): Payer: Self-pay | Admitting: Family Medicine

## 2016-02-26 ENCOUNTER — Emergency Department (HOSPITAL_COMMUNITY)
Admission: EM | Admit: 2016-02-26 | Discharge: 2016-02-26 | Disposition: A | Discharge status: Home / Self Care | Payer: BLUE CROSS/BLUE SHIELD | Attending: Emergency Medicine | Admitting: Emergency Medicine

## 2016-02-26 DIAGNOSIS — Z7982 Long term (current) use of aspirin: Secondary | ICD-10-CM | POA: Diagnosis not present

## 2016-02-26 DIAGNOSIS — I483 Typical atrial flutter: Secondary | ICD-10-CM | POA: Diagnosis not present

## 2016-02-26 DIAGNOSIS — N289 Disorder of kidney and ureter, unspecified: Secondary | ICD-10-CM

## 2016-02-26 DIAGNOSIS — G4733 Obstructive sleep apnea (adult) (pediatric): Secondary | ICD-10-CM | POA: Diagnosis present

## 2016-02-26 DIAGNOSIS — I2510 Atherosclerotic heart disease of native coronary artery without angina pectoris: Secondary | ICD-10-CM | POA: Diagnosis present

## 2016-02-26 DIAGNOSIS — I10 Essential (primary) hypertension: Secondary | ICD-10-CM | POA: Diagnosis not present

## 2016-02-26 DIAGNOSIS — E782 Mixed hyperlipidemia: Secondary | ICD-10-CM | POA: Insufficient documentation

## 2016-02-26 DIAGNOSIS — I4892 Unspecified atrial flutter: Secondary | ICD-10-CM | POA: Diagnosis present

## 2016-02-26 DIAGNOSIS — E785 Hyperlipidemia, unspecified: Secondary | ICD-10-CM | POA: Diagnosis present

## 2016-02-26 DIAGNOSIS — Z79899 Other long term (current) drug therapy: Secondary | ICD-10-CM | POA: Insufficient documentation

## 2016-02-26 DIAGNOSIS — Z951 Presence of aortocoronary bypass graft: Secondary | ICD-10-CM

## 2016-02-26 DIAGNOSIS — R0602 Shortness of breath: Secondary | ICD-10-CM | POA: Diagnosis present

## 2016-02-26 LAB — CBC
HEMATOCRIT: 38.3 % — AB (ref 39.0–52.0)
HEMOGLOBIN: 11.6 g/dL — AB (ref 13.0–17.0)
MCH: 28.1 pg (ref 26.0–34.0)
MCHC: 30.3 g/dL (ref 30.0–36.0)
MCV: 92.7 fL (ref 78.0–100.0)
Platelets: 110 10*3/uL — ABNORMAL LOW (ref 150–400)
RBC: 4.13 MIL/uL — AB (ref 4.22–5.81)
RDW: 15.2 % (ref 11.5–15.5)
WBC: 7.8 10*3/uL (ref 4.0–10.5)

## 2016-02-26 LAB — BASIC METABOLIC PANEL
ANION GAP: 9 (ref 5–15)
BUN: 20 mg/dL (ref 6–20)
CO2: 26 mmol/L (ref 22–32)
Calcium: 9.1 mg/dL (ref 8.9–10.3)
Chloride: 107 mmol/L (ref 101–111)
Creatinine, Ser: 1.36 mg/dL — ABNORMAL HIGH (ref 0.61–1.24)
GLUCOSE: 126 mg/dL — AB (ref 65–99)
POTASSIUM: 4.2 mmol/L (ref 3.5–5.1)
Sodium: 142 mmol/L (ref 135–145)

## 2016-02-26 LAB — I-STAT TROPONIN, ED: Troponin i, poc: 0 ng/mL (ref 0.00–0.08)

## 2016-02-26 MED ORDER — ETOMIDATE 2 MG/ML IV SOLN
INTRAVENOUS | Status: AC | PRN
Start: 2016-02-26 — End: 2016-02-26
  Administered 2016-02-26: 10 mg via INTRAVENOUS

## 2016-02-26 MED ORDER — METOPROLOL SUCCINATE ER 25 MG PO TB24: 25.0000 mg | ORAL_TABLET | Freq: Every day | ORAL | Status: DC

## 2016-02-26 MED ORDER — SODIUM CHLORIDE 0.9 % IV BOLUS (SEPSIS)
1000.0000 mL | Freq: Once | INTRAVENOUS | Status: AC
  Administered 2016-02-26: 1000 mL via INTRAVENOUS

## 2016-02-26 MED ORDER — DILTIAZEM HCL ER COATED BEADS 180 MG PO CP24: 180.0000 mg | ORAL_CAPSULE | Freq: Every day | ORAL | Status: DC

## 2016-02-26 MED ORDER — ETOMIDATE 2 MG/ML IV SOLN
10.0000 mg | Freq: Once | INTRAVENOUS | Status: DC
  Filled 2016-02-26: qty 10

## 2016-02-26 NOTE — ED Notes (Signed)
Pt here for SOB and fast HB. Recent here 2 weeks ago for the same and had cardioversion. sts weakness. Also recent heart surgery.

## 2016-02-26 NOTE — Sedation Documentation (Signed)
Dr. Billy Fischer, Dorothea Ogle (PA), Levada Dy (RN) and this RN at bedside. Witnessed consent. Consent at bedside.

## 2016-02-26 NOTE — ED Provider Notes (Signed)
CSN: [id number]     Arrival date & time 02/26/16  1007 History   First MD Initiated Contact with Patient 02/26/16 1021     Chief Complaint  Patient presents with  . Shortness of Breath  . Tachycardia   PROCEDURE NOTE  (Consider location/radiation/quality/duration/timing/severity/associated sxs/prior Treatment) HPI  PLEASE SEE PREVIOUS NOTE FOR CARE  Past Medical History  Diagnosis Date  . Hypercholesteremia     a. Prev taken off statin due to abnormal liver function.  . Hypertension   . Morbid obesity ([place])   . Abnormal liver enzymes     a. Sees a doctor in [place].  . Thrombocytopenia ([place])   . Hematuria     a. Chronic hx of this, no prior etiology determined through workup per patient.  . Sinus bradycardia   . 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 Coronary artery disease   . Atrial flutter with rapid ventricular response ([facility]) 02/12/2016   Past Surgical History  Procedure Laterality Date  . Gallbladder surgery    . Appendectomy    . Hernia repair    . Cardiac catheterization N/A 12/24/2015    Procedure: Right/Left Heart Cath and Coronary Angiography;  Surgeon: Jolaine Artist, MD;  Location: [facility];  Service: Cardiovascular;  Laterality: N/A;  . Coronary artery bypass graft N/A 12/26/2015    Procedure: Off Pump Coronary Artery Bypass Grafting times one using left internal mammary artery;  Surgeon: Rexene Alberts, MD;  Location: [facility] [place];  Service: Open Heart Surgery;  Laterality: N/A;  . Tee without cardioversion N/A 12/26/2015    Procedure: TRANSESOPHAGEAL ECHOCARDIOGRAM (TEE);  Surgeon: Rexene Alberts, MD;  Location: [facility];  Service: Open Heart Surgery;  Laterality: N/A;  . Cardioversion N/A 02/14/2016    Procedure: CARDIOVERSION;  Surgeon: Pixie Casino, MD;  Location: [facility] ENDOSCOPY;  Service: Cardiovascular;  Laterality: N/A;  . Tee without cardioversion N/A 02/14/2016    Procedure: TRANSESOPHAGEAL ECHOCARDIOGRAM (TEE);  Surgeon: Pixie Casino, MD;  Location: [facility] ENDOSCOPY;  Service: Cardiovascular;  Laterality: N/A;   Family History  Problem Relation Age of Onset  . Heart attack    . Hyperlipidemia    . CAD Father     2 stents ~ 54   Social History  Substance Use Topics  . Smoking status: Never Smoker   . Smokeless tobacco: None  . Alcohol Use: Yes    Review of Systems    Allergies  Review of patient's allergies indicates no known allergies.  Home Medications   Prior to Admission medications   Medication Sig Start Date End Date Taking? Authorizing Provider  allopurinol (ZYLOPRIM) 100 MG tablet Take 1 tablet (100 mg total) by mouth daily. 02/15/16  Yes Bhavinkumar Bhagat, PA  apixaban (ELIQUIS) 5 MG TABS tablet Take 1 tablet (5 mg total) by mouth 2 (two) times daily. 02/15/16  Yes Bhavinkumar Bhagat, PA  aspirin 81 MG tablet Take 1 tablet (81 mg total) by mouth daily. 02/15/16  Yes Bhavinkumar Bhagat, PA  cetirizine (ZYRTEC) 10 MG tablet Take 10 mg by mouth daily.   Yes Historical Provider, MD  furosemide (LASIX) 40 MG tablet Take 1 tablet (40 mg total) by mouth daily. 02/11/16  Yes Eileen Stanford, PA-C  nitroGLYCERIN (NITROSTAT) 0.4 MG SL tablet Place 1 tablet (0.4 mg total) under the tongue every 5 (five) minutes as needed for chest pain. 02/15/16  Yes Bhavinkumar Bhagat, PA  omeprazole (PRILOSEC) 20 MG capsule  Take 20 mg by mouth daily.   Yes Historical Provider, MD  potassium chloride (K-DUR,KLOR-CON) 10 MEQ tablet Take 2 tablets (20 mEq total) by mouth daily. 02/11/16  Yes Eileen Stanford, PA-C  rosuvastatin (CRESTOR) 40 MG tablet Take 1 tablet (40 mg total) by mouth daily at 6 PM. 12/31/15  Yes Miriah Maruyama R Barrett, PA-C  diltiazem (CARDIZEM CD) 180 MG 24 hr capsule Take 1 capsule (180 mg total) by mouth daily. 02/26/16   Almyra Deforest, PA  metoprolol succinate (TOPROL XL) 25 MG 24 hr tablet Take 1 tablet (25 mg total) by mouth daily. 02/26/16   Almyra Deforest, PA   BP 112/72 mmHg  Pulse 76  Temp(Src) 97.5 F (36.4 C)  (Oral)  Resp 41  Ht 5\' 11"  (1.803 m)  SpO2 96% Physical Exam  ED Course  .Sedation Date/Time: 02/26/2016 11:02 PM Performed by: Gareth Morgan Authorized by: Gareth Morgan  Consent:    Consent obtained:  Verbal and written   Consent given by:  Patient   Risks discussed:  Allergic reaction, inadequate sedation, nausea, vomiting, respiratory compromise necessitating ventilatory assistance and intubation, prolonged sedation necessitating reversal and prolonged hypoxia resulting in organ damage Indications:    Sedation purpose:  Cardioversion   Procedure necessitating sedation performed by:  Physician performing sedation   Intended level of sedation:  Moderate (conscious sedation) Pre-sedation assessment:    ASA classification: class 3 - patient with severe systemic disease     Neck mobility: normal     Mouth opening:  3 or more finger widths   Thyromental distance:  4 finger widths   Mallampati score:  II - soft palate, uvula, fauces visible   Pre-sedation assessments completed and reviewed: airway patency, cardiovascular function, hydration status, mental status, nausea/vomiting, pain level, respiratory function and temperature   Immediate pre-procedure details:    Reassessment: Patient reassessed immediately prior to procedure   Procedure details (see MAR for exact dosages):    Sedation start time:  02/26/2016 11:50 AM   Sedation:  Etomidate   Intra-procedure monitoring:  Blood pressure monitoring, cardiac monitor, continuous capnometry, continuous pulse oximetry, frequent LOC assessments and frequent vital sign checks   Intra-procedure events: none     Sedation end time:  02/26/2016 12:09 PM   Total sedation time (minutes):  20 Post-procedure details:    Attendance: Constant attendance by certified staff until patient recovered     Recovery: Patient returned to pre-procedure baseline     Post-sedation assessments completed and reviewed: airway patency, cardiovascular  function, hydration status, mental status, nausea/vomiting, pain level, respiratory function and temperature     Patient is stable for discharge or admission: Yes     Patient tolerance:  Tolerated well, no immediate complications .Cardioversion Date/Time: 02/26/2016 11:05 PM Performed by: Gareth Morgan Authorized by: Gareth Morgan Consent: Written consent obtained. Risks and benefits: risks, benefits and alternatives were discussed Required items: required blood products, implants, devices, and special equipment available Patient identity confirmed: verbally with patient Time out: Immediately prior to procedure a "time out" was called to verify the correct patient, procedure, equipment, support staff and site/side marked as required. Patient sedated: yes Sedation type: moderate (conscious) sedation Sedatives: etomidate Cardioversion basis: emergent Pre-procedure rhythm: atrial flutter Patient position: patient was placed in a supine position Chest area: chest area exposed Electrodes: pads Number of attempts: 1 Attempt 1 mode: synchronous Attempt 1 shock (in Joules): 150 Attempt 1 outcome: conversion to normal sinus rhythm Post-procedure rhythm: normal sinus rhythm Complications: no complications Patient tolerance:  Patient tolerated the procedure well with no immediate complications   (including critical care time) Labs Review Labs Reviewed  BASIC METABOLIC PANEL - Abnormal; Notable for the following:    Glucose, Bld 126 (*)    Creatinine, Ser 1.36 (*)    All other components within normal limits  CBC - Abnormal; Notable for the following:    RBC 4.13 (*)    Hemoglobin 11.6 (*)    HCT 38.3 (*)    Platelets 110 (*)    All other components within normal limits  I-STAT TROPOININ, ED    Imaging Review Dg Chest Portable 1 View  02/26/2016  CLINICAL DATA:  Shortness of breath.  Weakness. EXAM: PORTABLE CHEST 1 VIEW COMPARISON:  02/11/2016 FINDINGS: Bb median sternotomy and  CABG procedure has been performed. Stable cardiac enlargement. There is chronic asymmetric elevation of the left hemidiaphragm with overlying scar/atelectasis. No superimposed pulmonary edema or airspace consolidation. No evidence for pleural effusion. IMPRESSION: 1. Cardiac enlargement 2. 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 Electronically Signed   By: Kerby Moors M.D.   On: 02/26/2016 11:22   I have personally reviewed and evaluated these images and lab results as part of my medical decision-making.   EKG Interpretation   Date/Time:  Tuesday February 26 2016 10:13:38 EDT Ventricular Rate:  122 PR Interval:    QRS Duration: 88 QT Interval:  282 QTC Calculation: 401 R Axis:   12 Text Interpretation:  Atrial flutter with variable A-V block Minimal  voltage criteria for LVH, may be normal variant Inferior infarct , age  undetermined Abnormal ECG Since prior ECG, atrial flutter has developed  Confirmed by [facility] MD, [place] ([id number]) on 02/26/2016 10:21:04 AM      MDM   Final diagnoses:  Typical atrial flutter ([place])   PROCEDURE NOTE PLEASE SEE PREVIOUS ED NOTE FOR CARE  Gareth Morgan, MD 02/26/16 2306

## 2016-02-26 NOTE — ED Notes (Signed)
Attempted 2nd IV access site x 2 w/o success.

## 2016-02-26 NOTE — Telephone Encounter (Signed)
New message      Mother of the patient is calling stating that patient's heart rate is in the 140's. Please call him at [phone]

## 2016-02-26 NOTE — Telephone Encounter (Signed)
Pts mother and pt calling to inform Dr Meda Coffee that they think the pt is back in aflutter at a rate of 140s.  Pts mother states that he is extremely fatigued and has some mild sob.  Pt does not complain of chest pain at this time.  Pt is on home O2  24 hrs a day,  and his 02 sats are 95 % 2 L/min.  Pt has DOE as well. Pt was recently discharged from the hospital with rapid aflutter and received a cardioversion.  He was then started on Eliquis 5 mg po bid, and has missed no doses.  Pt and mother are both afraid that he is back in "aflutter and will need to be shocked again." Advised both parties that given his critical heart hx, and being he was discharged from the hospital on 02/15/16 with rapid aflutter and cardioversion intervention was needed, he should defer to the ER for further evaluation.  Advised the pt to go EMS, and his mother states "I will take him." Pt has a post-hospital follow-up appt on this Thursday 3/30 with [place] PA-C.  Informed the pt and mother that I will notify our cardmaster Trish that the pt will be en route via private vehicle with possible rapid aflutter at a rate of 140 bmp.  Informed both parties that I will route this message to Dr Meda Coffee for further review of pts symptoms.  Wannetta Sender was notified at 0910 about pt coming to the ER now.

## 2016-02-26 NOTE — Consult Note (Signed)
CARDIOLOGY CONSULT NOTE   Patient ID: Sean [name] MRN: [id number], DOB/AGE: 43/18/1972   Admit date: 02/26/2016 Date of Consult: 02/26/2016   Primary Physician: [name], MD Primary Cardiologist: Dr. Meda Coffee  Pt. 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 Hypercholesteremia     a. Prev taken off statin due to abnormal liver function.  . Hypertension   . Morbid obesity ([place])   . Abnormal liver enzymes     a. Sees a doctor in [place].  . Thrombocytopenia ([place])   . Hematuria     a. Chronic hx of this, no prior etiology determined through workup per patient.  . Sinus bradycardia   . 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 Coronary artery disease   . Atrial flutter with rapid ventricular response ([place]) 02/12/2016    Past Surgical History  Procedure Laterality Date  . Gallbladder surgery    . Appendectomy    . Hernia repair    . Cardiac catheterization N/A 12/24/2015    Procedure: Right/Left Heart Cath and Coronary Angiography;  Surgeon: Jolaine Artist, MD;  Location: [place] [facility];  Service: Cardiovascular;  Laterality: N/A;  . Coronary artery bypass graft N/A 12/26/2015    Procedure: Off Pump Coronary Artery Bypass Grafting times one using left internal mammary artery;  Surgeon: Rexene Alberts, MD;  Location: [place];  Service: Open Heart Surgery;  Laterality: N/A;  . Tee without cardioversion N/A 12/26/2015    Procedure: TRANSESOPHAGEAL ECHOCARDIOGRAM (TEE);  Surgeon:  Rexene Alberts, MD;  Location: [place];  Service: Open Heart Surgery;  Laterality: N/A;  . Cardioversion N/A 02/14/2016    Procedure: CARDIOVERSION;  Surgeon: Pixie Casino, MD;  Location: [facility]COPY;  Service: Cardiovascular;  Laterality: N/A;  . 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 He presented with chest pain on 12/23/2015, cardiac catheterization performed on 1/23 showed severe 1 vessel CAD with 95% ostial to proximal LAD, 20% proximal RCA, 40% mid LAD, 40% OM1. Although the LAD lesion may be approachable percutaneously however given his young age, long-term antiplatelet therapy may not be the best option. Cardiothoracic surgery was consulted and he underwent off-pump LIMA to LAD by Dr. Roxy Manns on 12/26/2015. Post CABG, he initially did well, however he presented with palpitation and fatigue on 02/12/2016 and diagnosed with atrial flutter with RVR. He did not tolerate IV diltiazem initially as he became hypotensive. It was difficult to control his heart rate even on digoxin, IV amiodarone  and Cardizem. He eventually underwent TEE DCCV on 02/14/2016. Interestingly, TEE revealed EF is down to 35-40% whereas previous echo in January showed normal EF. Eliquis was started for systemic anticoagulation therapy. TSH was normal. He was discharged on 3/18  Since discharge, he has been compliant with his  medication including eliquis. Yesterday around noon, he started noticing increasing shortness breath and noticed his heart rate is fast. He presented to [facility] on the following day on 02/18/2016. Given the fact that he has been compliant with his systemic anticoagulation therapy, he underwent DC cardioversion in the ED. Cardiology was consulted for atrial flutter, however by the time we have seen the patient, he has been maintaining sinus rhythm post cardioversion for at least 3 hours. He is currently doing well, he does have 1+ pitting edema in the lower extremity which he says is chronic for him. He is currently asymptomatic.     Inpatient Medications  . etomidate  10 mg Intravenous Once    Family History Family History  Problem Relation Age of Onset  . Heart attack    . Hyperlipidemia    . CAD Father     2 stents ~ 83     Social History Social History   Social History  . Marital Status: Legally Separated    Spouse Name: N/A  . Number of Children: N/A  . Years of Education: N/A   Occupational History  . Truck Geophysicist/field seismologist    Social History Main Topics  . Smoking status: Never Smoker   . Smokeless tobacco: Not on file  . Alcohol Use: Yes  . Drug Use: No  . Sexual Activity: Not on file   Other Topics Concern  . Not on file   Social History Narrative     Review of Systems  General:  No chills, fever, night sweats or weight changes.  Cardiovascular:  No chest pain, edema, orthopnea, palpitations, paroxysmal nocturnal dyspnea. +SOB Dermatological: No rash, lesions/masses Respiratory: No cough +dyspnea Urologic: No hematuria, dysuria Abdominal:   No nausea, vomiting, diarrhea, bright red blood per rectum, melena, or hematemesis Neurologic:  No visual changes, wkns, changes in mental status. All other systems reviewed and are otherwise negative except as noted above.  Physical Exam  Blood pressure 113/72, pulse 84, temperature 97.5 F (36.4 C), temperature  source Oral, resp. rate 28, height 5\' 11"  (1.803 m), SpO2 98 %.  General: Pleasant, NAD Psych: Normal affect. Neuro: Alert and oriented X 3. Moves all extremities spontaneously. HEENT: Normal  Neck: Supple without bruits or JVD. Lungs:  Resp regular and unlabored. Decreased breath sound in bilateral bases, but no obvious rale, wheezing or rhonchi Heart: RRR no s3, s4, or murmurs. Abdomen: Soft, non-tender, non-distended, BS + x 4.  Extremities: No clubbing, cyanosis. DP/PT/Radials 2+ and equal bilaterally. 1+ pitting edema  Labs  No results for input(s): CKTOTAL, CKMB, TROPONINI in the last 72 hours. Lab Results  Component Value Date   WBC 7.8 02/26/2016   HGB 11.6* 02/26/2016   HCT 38.3* 02/26/2016   MCV 92.7 02/26/2016   PLT 110* 02/26/2016    Recent Labs Lab 02/26/16 1045  NA 142  K 4.2  CL 107  CO2 26  BUN 20  CREATININE 1.36*  CALCIUM 9.1  GLUCOSE 126*   Lab Results  Component Value Date   CHOL 183 12/24/2015   HDL 32* 12/24/2015   LDLCALC 121* 12/24/2015   TRIG 149 12/24/2015   No results found for: DDIMER  Radiology/Studies  Dg Chest 2 View  01/28/2016  CLINICAL DATA:  45 year old male with history of CABG. Anterior chest tightness. Bradycardia. EXAM: CHEST  2 VIEW COMPARISON:  Chest x-ray 12/29/2015. FINDINGS: Chronic elevation of left hemidiaphragm. No acute consolidative airspace disease. No pleural effusions. No evidence of pulmonary edema. Cardiomegaly. Mild rightward shift of cardiac structures secondary to chronically elevated left hemidiaphragm. Upper mediastinal contours are within normal limits. Status post median sternotomy. IMPRESSION: 1. No radiographic evidence of acute cardiopulmonary disease. 2. Chronic elevation of left hemidiaphragm. Electronically Signed   By: Vinnie Langton M.D.   On: 01/28/2016 14:18   Dg Chest Portable 1 View  02/26/2016  CLINICAL DATA:  Shortness of breath.  Weakness. EXAM: PORTABLE CHEST 1 VIEW COMPARISON:  02/11/2016  FINDINGS: Bb median sternotomy and CABG procedure has been performed. Stable cardiac enlargement. There is chronic asymmetric elevation of the left hemidiaphragm with overlying scar/atelectasis. No superimposed pulmonary edema or airspace consolidation. No evidence for pleural effusion. IMPRESSION: 1. Cardiac enlargement 2. 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 Electronically Signed   By: Kerby Moors M.D.   On: 02/26/2016 11:22   Dg Chest Port 1 View  02/11/2016  CLINICAL DATA:  Acute onset of shortness of breath, tachycardia and indigestion. Nausea. Initial encounter. EXAM: PORTABLE CHEST 1 VIEW COMPARISON:  Chest radiograph performed 01/28/2016 FINDINGS: There is elevation of the left hemidiaphragm, with mild left basilar atelectasis. There is no evidence of pleural effusion or pneumothorax. The cardiomediastinal silhouette is mildly enlarged. The patient is status post median sternotomy. No acute osseous abnormalities are seen. IMPRESSION: Elevation of the left hemidiaphragm, with mild left basilar atelectasis. Mild cardiomegaly. Electronically Signed   By: Garald Balding M.D.   On: 02/11/2016 22:59    ECG  Atrial flutter with RVR  ASSESSMENT AND PLAN  1. Recurrent atrial flutter with RVR, underwent successful DC cardioversion in the ED  - CHA2DS2-Vasc score 3 (hypertension, CAD, HF), recently underwent TEE DCCV on 02/14/2016  - Patient has been compliant with his medication and still had recurrent atrial flutter, will increase his diltiazem from 120 mg up to 180 mg daily. Continue eliquis. Likely discharge from the ED.  - discussed with Dr. Claiborne Billings, will switch his atenolol 25mg  daily to 25mg  metoprolol XL daily given his LV dysfunction, hopefully his LV dysfunction is caused by tachycardia and we will see some improvement in his LV with rate control. Will plan to uptitrate his toprol XL while decreasing diltiazem as outpatient.  - Will discuss with M.D. to potentially refer him  to EP for consideration of ablation procedure even his young age, current need of NOAC and recurrent aflutter.  2. CAD s/p off pump LIMA to LAD: no obvious angina  3. Chronic systolic heart failure: Noted to have LV dysfunction with EF 35-40% on recent TEE, TTE prior to CABG in January showed normal EF  - he is on 40mg  daily lasix, i have instructed him to take additional lasix for increased swelling  4. Hypertension  5. Hyperlipidemia  6. CKD stage II  Hilbert Corrigan, PA-C 02/26/2016, 3:39 PM   Patient seen and examined. Agree with assessment and plan.  Mr. Talkington is a 45 year old morbidly obese male who has a history of hypertension, hyperlipidemia, gout, thrombocytopenia, who is status post CBG surgery 1 with a LIMA to his LAD in January 2007 and has recent development of atrial flutter status post TEE DC cardioversion.  Of note, the patient also has a history of sleep apnea diagnosed initially  in 2009.  He only used CPAP for minimal duration.  Recently, his sleep has been nonrestorative and he snores loudly.  He has been maintained on heart exam 120 mg and atenolol 12.5 g twice a day and developed recurrent atrial flutter leading to his emergency room evaluation.  Since he has been on Eliquis anticoagulation.  He underwent successful DC cardioversion with restoration of sinus rhythm.  Presently, he is in sinus rhythm without chest pain or shortness of breath.  I long discussion with him concerning the increase likely hood of recurrent atrial flutter/fibrillation.  If sleep apnea is untreated.  Is alert, we will increase his Cardizem CD from 120 mg to 180 mg.  We will change his atenolol to Toprol-XL 25 g daily with plans to further titrate this as blood pressure and heart rate allow.  Patient will require repeat sleep study and CPAP should be reinstituted.  I discussed with him the new technology and the new masks that have significantly improved compared to 2009.  His updated prescriptions  were sent to his pharmacy.  The patient will be discharged from the emergency room with plans for a APP follow-up evaluation within a week.  Troy Sine, MD, [facility] 02/26/2016 4:43 PM

## 2016-02-26 NOTE — Discharge Instructions (Signed)
Please read and follow all provided instructions.  Your diagnoses today include:  1. Typical atrial flutter ([place])    Tests performed today include:  Vital signs. See below for your results today.   Medications prescribed:  Medications as per Cardiology Recommendation   Home care instructions:  Follow any educational materials contained in this packet.  Follow-up instructions: Please follow-up with Cardiology at scheduled appointments further evaluation of symptoms and treatment   Return instructions:   Please return to the Emergency Department if you do not get better, if you get worse, or new symptoms OR  - Fever (temperature greater than 101.29F)  - Bleeding that does not stop with holding pressure to the area    -Severe pain (please note that you may be more sore the day after your accident)  - Chest Pain  - Difficulty breathing  - Severe nausea or vomiting  - Inability to tolerate food and liquids  - Passing out  - Skin becoming red around your wounds  - Change in mental status (confusion or lethargy)  - New numbness or weakness     Please return if you have any other emergent concerns.  Additional Information:  Your vital signs today were: BP 116/70 mmHg   Pulse 72   Temp(Src) 97.5 F (36.4 C) (Oral)   Resp 28   Ht 5\' 11"  (1.803 m)   SpO2 100% If your blood pressure (BP) was elevated above 135/85 this visit, please have this repeated by your doctor within one month. ---------------

## 2016-02-26 NOTE — ED Provider Notes (Signed)
CSN: [id number]     Arrival date & time 02/26/16  1007 History   First MD Initiated Contact with Patient 02/26/16 1021     Chief Complaint  Patient presents with  . Shortness of Breath  . Tachycardia   (Consider location/radiation/quality/duration/timing/severity/associated sxs/prior Treatment) HPI 45 y.o. male with a hx of CAD s/p CABG 12-26-15, HLD, HTN, A Flutter, presents to the Emergency Department today complaining of fatigue, indigestion, rapid heart rate since yesterday. Noted recent DC from Hospital on 02-15-16 after being diagnosed with A Flutter. Numerous attempts were made to control with medication, but had to be Cardioverted on 02-14-16. Pt is on Dilt, Atenolol, Lasix. Has follow up Cardiology appointment on 02-28-16. Being seen by Dr. Meda Coffee. Pt is on Home O2 2LNC. No pain currently. Main complaint is fatigue and increased HR. No CP/SOB/ABD pain. No N/V/D. No diaphoresis. No other symptoms noted.     Past Medical History  Diagnosis Date  . Hypercholesteremia     a. Prev taken off statin due to abnormal liver function.  . Hypertension   . Morbid obesity ([place])   . Abnormal liver enzymes     a. Sees a doctor in [place].  . Thrombocytopenia ([place])   . Hematuria     a. Chronic hx of this, no prior etiology determined through workup per patient.  . Sinus bradycardia   . 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 Coronary artery disease   . Atrial flutter with rapid ventricular response ([place]) 02/12/2016   Past Surgical History  Procedure Laterality Date  . Gallbladder surgery    . Appendectomy    . Hernia repair    . Cardiac catheterization N/A 12/24/2015    Procedure: Right/Left Heart Cath and Coronary Angiography;  Surgeon: Jolaine Artist, MD;  Location: [place] [facility];  Service: Cardiovascular;  Laterality: N/A;  . Coronary artery bypass graft N/A 12/26/2015    Procedure: Off Pump Coronary Artery Bypass Grafting times one using left internal mammary artery;   Surgeon: Rexene Alberts, MD;  Location: [place];  Service: Open Heart Surgery;  Laterality: N/A;  . Tee without cardioversion N/A 12/26/2015    Procedure: TRANSESOPHAGEAL ECHOCARDIOGRAM (TEE);  Surgeon: Rexene Alberts, MD;  Location: [place];  Service: Open Heart Surgery;  Laterality: N/A;  . Cardioversion N/A 02/14/2016    Procedure: CARDIOVERSION;  Surgeon: Pixie Casino, MD;  Location: [facility] ENDOSCOPY;  Service: Cardiovascular;  Laterality: N/A;  . Tee without cardioversion N/A 02/14/2016    Procedure: TRANSESOPHAGEAL ECHOCARDIOGRAM (TEE);  Surgeon: Pixie Casino, MD;  Location: [facility] ENDOSCOPY;  Service: Cardiovascular;  Laterality: N/A;   Family History  Problem Relation Age of Onset  . Heart attack    . Hyperlipidemia    . CAD Father     2 stents ~ 4   Social History  Substance Use Topics  . Smoking status: Never Smoker   . Smokeless tobacco: None  . Alcohol Use: Yes    Review of Systems ROS reviewed and all are negative for acute change except as noted in the HPI.  Allergies  Review of patient's allergies indicates no known allergies.  Home Medications   Prior to Admission medications   Medication Sig Start Date End Date Taking? Authorizing Provider  allopurinol (ZYLOPRIM) 100 MG tablet Take 1 tablet (100 mg total) by mouth daily. 02/15/16   Bhavinkumar Bhagat, PA  apixaban (ELIQUIS) 5 MG TABS tablet Take 1 tablet (5 mg total)  by mouth 2 (two) times daily. 02/15/16   Bhavinkumar Bhagat, PA  aspirin 81 MG tablet Take 1 tablet (81 mg total) by mouth daily. 02/15/16   Bhavinkumar Bhagat, PA  atenolol (TENORMIN) 25 MG tablet Take 0.5 tablets (12.5 mg total) by mouth 2 (two) times daily. Patient taking differently: Take 25 mg by mouth daily.  12/31/15   [name] R Barrett, PA-C  cetirizine (ZYRTEC) 10 MG tablet Take 10 mg by mouth daily.    Historical Provider, MD  diltiazem (CARDIZEM CD) 120 MG 24 hr capsule Take 1 capsule (120 mg total) by mouth daily. 02/15/16   Bhavinkumar Bhagat, PA   furosemide (LASIX) 40 MG tablet Take 1 tablet (40 mg total) by mouth daily. 02/11/16   Eileen Stanford, PA-C  nitroGLYCERIN (NITROSTAT) 0.4 MG SL tablet Place 1 tablet (0.4 mg total) under the tongue every 5 (five) minutes as needed for chest pain. 02/15/16   Bhavinkumar Bhagat, PA  omeprazole (PRILOSEC) 20 MG capsule Take 20 mg by mouth daily.    Historical Provider, MD  potassium chloride (K-DUR,KLOR-CON) 10 MEQ tablet Take 2 tablets (20 mEq total) by mouth daily. 02/11/16   Eileen Stanford, PA-C  rosuvastatin (CRESTOR) 40 MG tablet Take 1 tablet (40 mg total) by mouth daily at 6 PM. 12/31/15   [name] R Barrett, PA-C   BP 92/73 mmHg  Pulse 151  Temp(Src) 97.5 F (36.4 C) (Oral)  Resp 16  Ht 5\' 11"  (1.803 m)  SpO2 94%   Physical Exam  Constitutional: He is oriented to person, place, and time. He appears well-developed and well-nourished.  HENT:  Head: Normocephalic and atraumatic.  Eyes: EOM are normal. Pupils are equal, round, and reactive to light.  Neck: Normal range of motion. Neck supple.  Cardiovascular: Regular rhythm, normal heart sounds, intact distal pulses and normal pulses.  Tachycardia present.   No murmur heard. Pulmonary/Chest: Effort normal and breath sounds normal. No respiratory distress. He has no wheezes. He has no rales. He exhibits no tenderness.  Abdominal: Soft. There is no tenderness.  Musculoskeletal: Normal range of motion.  Neurological: He is alert and oriented to person, place, and time.  Skin: Skin is warm and dry.  Psychiatric: He has a normal mood and affect. His behavior is normal. Thought content normal.  Nursing note and vitals reviewed.  ED Course  .Cardioversion Date/Time: 02/26/2016 9:20 PM Performed by: Shary Decamp Authorized by: Shary Decamp Consent: Verbal consent obtained. Written consent obtained. Consent given by: patient Patient understanding: patient states understanding of the procedure being performed Patient consent: the  patient's understanding of the procedure matches consent given Procedure consent: procedure consent matches procedure scheduled Relevant documents: relevant documents present and verified Patient identity confirmed: verbally with patient and arm band Time out: Immediately prior to procedure a "time out" was called to verify the correct patient, procedure, equipment, support staff and site/side marked as required. Patient sedated: yes Sedatives: etomidate Cardioversion basis: elective Indications: failure of anti-arrhythmic medications and hemodynamic instability Pre-procedure rhythm: atrial flutter Patient position: patient was placed in a supine position Chest area: chest area exposed Electrodes: pads Electrodes placed: anterior-posterior Number of attempts: 1 Attempt 1 mode: synchronous Attempt 1 waveform: biphasic Attempt 1 shock (in Joules): 150 Attempt 1 outcome: conversion to normal sinus rhythm Post-procedure rhythm: normal sinus rhythm Complications: no complications Patient tolerance: Patient tolerated the procedure well with no immediate complications   (including critical care time) CRITICAL CARE Performed by: Ozella Rocks  Total critical care time:  40 minutes  Critical care time was exclusive of separately billable procedures and treating other patients.  Critical care was necessary to treat or prevent imminent or life-threatening deterioration.  Critical care was time spent personally by me on the following activities: development of treatment plan with patient and/or surrogate as well as nursing, discussions with consultants, evaluation of patient's response to treatment, examination of patient, obtaining history from patient or surrogate, ordering and performing treatments and interventions, ordering and review of laboratory studies, ordering and review of radiographic studies, pulse oximetry and re-evaluation of patient's condition.  Labs Review Labs Reviewed   BASIC METABOLIC PANEL - Abnormal; Notable for the following:    Glucose, Bld 126 (*)    Creatinine, Ser 1.36 (*)    All other components within normal limits  CBC - Abnormal; Notable for the following:    RBC 4.13 (*)    Hemoglobin 11.6 (*)    HCT 38.3 (*)    Platelets 110 (*)    All other components within normal limits  I-STAT TROPOININ, ED   Imaging Review No results found. I have personally reviewed and evaluated these images and lab results as part of my medical decision-making.   EKG Interpretation   Date/Time:  Tuesday February 26 2016 10:13:38 EDT Ventricular Rate:  122 PR Interval:    QRS Duration: 88 QT Interval:  282 QTC Calculation: 401 R Axis:   12 Text Interpretation:  Atrial flutter with variable A-V block Minimal  voltage criteria for LVH, may be normal variant Inferior infarct , age  undetermined Abnormal ECG Since prior ECG, atrial flutter has developed  Confirmed by [facility] MD, [place] ([id number]) on 02/26/2016 10:21:04 AM      MDM  I have reviewed and evaluated the relevant laboratory values.I have reviewed and evaluated the relevant imaging studies.I personally evaluated and interpreted the relevant EKG.I have reviewed the relevant previous healthcare records.I have reviewed EMS Documentation.I obtained HPI from historian. Patient discussed with supervising physician  ED Course:  Assessment: Pt is a 45yM with hx AFlutter who presents with tachycardia and fatigue. On exam, pt in NAD. Nontoxic/nonseptic appearing. VS show tachycardia and hypotension. Afebrile. Lungs CTA. Abdomen nontender soft. EKG shows A Flutter with 140bpm. Previous admission states medication did not work to eliminate flutter. Resorted to cardioversion. Spoke with Dr. Meda Coffee who agreed with Cardioversion due to patient's hypotensive status. Pt was Cardioverted in ED with Etomidate used for sedation. Successfully converted back to NSR. Initial trop negative. CXR unremarkable for acute  infiltrate. Dispo pending Cardiology consult for home vs admission.   Disposition/Plan:  3:45 PM- Sign out: Delrae Rend, PA-C Pt acknowledges and agrees with plan  Supervising Physician Gareth Morgan, MD    Final diagnoses:  Typical atrial flutter [facility])      Shary Decamp, PA-C 02/26/16 [place], PA-C 02/26/16 2123  Gareth Morgan, MD 02/26/16 2314

## 2016-02-26 NOTE — Sedation Documentation (Signed)
Pt cardioverted with 150 J

## 2016-02-26 NOTE — ED Provider Notes (Signed)
Sign out received from Shary Decamp PA-C at end of shift. Sean [name] is an 45 y.o. male who presented to the ED earlier today for evaluation of SOB and tachycardia. He was found to be in flutter and cardioverted successfully. At this time cards consult is pending. Dispo is pending cards consult. Given cardioversion suspect pt will be admitted at least for obs. However, if cleared by cards he may go home with outpatient f/u.   Physical Exam  BP 113/72 mmHg  Pulse 84  Temp(Src) 97.5 F (36.4 C) (Oral)  Resp 28  Ht 5\' 11"  (1.803 m)  SpO2 98%  Physical Exam  Constitutional: He is oriented to person, place, and time. No distress.  HENT:  Head: Atraumatic.  Right Ear: External ear normal.  Left Ear: External ear normal.  Nose: Nose normal.  Eyes: Conjunctivae are normal. No scleral icterus.  Neck: Normal range of motion. Neck supple.  Cardiovascular: Normal rate and regular rhythm.   Pulmonary/Chest: Effort normal. No respiratory distress. He exhibits no tenderness.  Abdominal: Soft. He exhibits no distension. There is no tenderness.  Neurological: He is alert and oriented to person, place, and time.  Skin: Skin is warm and dry. He is not diaphoretic.  Psychiatric: He has a normal mood and affect. His behavior is normal.  Nursing note and vitals reviewed.  Filed Vitals:   02/26/16 1345 02/26/16 1400 02/26/16 1402 02/26/16 1445  BP: 104/75 108/71 108/71 113/72  Pulse: 71 82 89 84  Temp:      TempSrc:      Resp: 20 20 25 28   Height:      SpO2: 99% 100% 100% 98%     ED Course  Procedures   Results for orders placed or performed during the hospital encounter of [id number]  Basic metabolic panel  Result Value Ref Range   Sodium 142 135 - 145 mmol/L   Potassium 4.2 3.5 - 5.1 mmol/L   Chloride 107 101 - 111 mmol/L   CO2 26 22 - 32 mmol/L   Glucose, Bld 126 (H) 65 - 99 mg/dL   BUN 20 6 - 20 mg/dL   Creatinine, Ser 1.36 (H) 0.61 - 1.24 mg/dL   Calcium 9.1 8.9 - 10.3 mg/dL   GFR calc  non Af Amer >60 >60 mL/min   GFR calc Af Amer >60 >60 mL/min   Anion gap 9 5 - 15  CBC  Result Value Ref Range   WBC 7.8 4.0 - 10.5 K/uL   RBC 4.13 (L) 4.22 - 5.81 MIL/uL   Hemoglobin 11.6 (L) 13.0 - 17.0 g/dL   HCT 38.3 (L) 39.0 - 52.0 %   MCV 92.7 78.0 - 100.0 fL   MCH 28.1 26.0 - 34.0 pg   MCHC 30.3 30.0 - 36.0 g/dL   RDW 15.2 11.5 - 15.5 %   Platelets 110 (L) 150 - 400 K/uL  I-stat troponin, ED (not at [facility])  Result Value Ref Range   Troponin i, poc 0.00 0.00 - 0.08 ng/mL   Comment 3            Dg Chest Portable 1 View  02/26/2016  CLINICAL DATA:  Shortness of breath.  Weakness. EXAM: PORTABLE CHEST 1 VIEW COMPARISON:  02/11/2016 FINDINGS: Bb median sternotomy and CABG procedure has been performed. Stable cardiac enlargement. There is chronic asymmetric elevation of the left hemidiaphragm with overlying scar/atelectasis. No superimposed pulmonary edema or airspace consolidation. No evidence for pleural effusion. IMPRESSION: 1. Cardiac enlargement 2. Chronic  asymmetric elevation of the left hemidiaphragm. Electronically Signed   By: Kerby Moors M.D.   On: 02/26/2016 11:22      MDM  Per cards, pt safe for discharge with outpatient f/u. Cards increased Cardizem CD from 120 mg to 180 mg and changed his atenolol to Toprol-XL 25mg  daily. Plan for Cards f/u within one week. Pt is in agreement with this plan. Strict ER return precautions were given.     Anne Ng, PA-C 02/26/16 1723  Carmin Muskrat, MD 02/26/16 [phone]

## 2016-02-28 ENCOUNTER — Encounter: Payer: Self-pay | Admitting: Cardiology

## 2016-02-28 ENCOUNTER — Ambulatory Visit (INDEPENDENT_AMBULATORY_CARE_PROVIDER_SITE_OTHER): Payer: BLUE CROSS/BLUE SHIELD | Admitting: Cardiology

## 2016-02-28 VITALS — BP 118/74 | HR 71 | Ht 71.0 in | Wt 328.0 lb

## 2016-02-28 DIAGNOSIS — I4892 Unspecified atrial flutter: Secondary | ICD-10-CM

## 2016-02-28 NOTE — Progress Notes (Signed)
02/28/2016 Sean [name]   Aug 11, 1971  [id number]  Primary Physician Sean Rile, MD Primary Cardiologist: Dr. Meda [name]   Reason for Visit/CC: [facility] F/u for Atrial Flutter  HPI:  Mr. 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   He presented back to [facility] on 02/11/16 with atrial flutter w/ RVR. IV Cardizem initiated for rate control. He was continued on Eliquis. He underwent successful TEE (negative for LA thrombus), followed by DCCV back to NSR. EF was estimated at 35-40%.   He presented back to the [facility] ED on 02/26/16 with recurrent atrial flutter w/ RVR. Since he had been on Eliquis anticoagulation, he underwent successful DC cardioversion with restoration of sinus rhythm. He was seen in ED by Dr. Claiborne [name] who increased his Cardizem CD to 180 mg. He also changed his BB from atenolol to Toprol-XL 25 g daily with plans to further titrate this as blood pressure and heart rate allow.He also advised that he improve compliance with his CPAP and undergo a repeat sleep study.  He now presents to clinic for post hospital f/u. He is accompanied by his sister. He reports that he has done well. No recurrent palpitations, chest pain or dyspnea. He has been fully compliant with his meds. No missed doses of Eliquis.    Current Outpatient Prescriptions  Medication Sig Dispense Refill  . allopurinol (ZYLOPRIM) 100 MG tablet Take 1 tablet (100 mg total) by mouth daily. 30 tablet 1  . apixaban (ELIQUIS) 5 MG TABS tablet Take 1 tablet (5 mg total) by mouth 2 (two) times daily. 60 tablet 11  . aspirin 81 MG tablet Take 1 tablet (81 mg  total) by mouth daily.    . cetirizine (ZYRTEC) 10 MG tablet Take 10 mg by mouth daily.    Marland Kitchen diltiazem (CARDIZEM CD) 180 MG 24 hr capsule Take 1 capsule (180 mg total) by mouth daily. 90 capsule 3  . furosemide (LASIX) 40 MG tablet Take 1 tablet (40 mg total) by mouth daily. 30 tablet 3  . metoprolol succinate (TOPROL XL) 25 MG 24 hr tablet Take 1 tablet (25 mg total) by mouth daily. 90 tablet 3  . nitroGLYCERIN (NITROSTAT) 0.4 MG SL tablet Place 1 tablet (0.4 mg total) under the tongue every 5 (five) minutes as needed for chest pain. 25 tablet 12  . omeprazole (PRILOSEC) 20 MG capsule Take 20 mg by mouth daily.    . potassium chloride (K-DUR,KLOR-CON) 10 MEQ tablet Take 2 tablets (20 mEq total) by mouth daily. 60 tablet 3  . rosuvastatin (CRESTOR) 40 MG tablet Take 1 tablet (40 mg total) by mouth daily at 6 PM. 30 tablet 3   No current facility-administered medications for this visit.    No Known Allergies  Social History   Social History  . Marital Status: Legally Separated    Spouse Name: N/A  . Number of Children: N/A  . Years of Education: N/A   Occupational History  . Truck Geophysicist/field seismologist    Social History Main Topics  . Smoking status: Never Smoker   . Smokeless tobacco: Not on file  . Alcohol Use: Yes  . Drug Use: No  . Sexual Activity: Not on file  Other Topics Concern  . Not on file   Social History Narrative     Review of Systems: General: negative for chills, fever, night sweats or weight changes.  Cardiovascular: negative for chest pain, dyspnea on exertion, edema, orthopnea, palpitations, paroxysmal nocturnal dyspnea or shortness of breath Dermatological: negative for rash Respiratory: negative for cough or wheezing Urologic: negative for hematuria Abdominal: negative for nausea, vomiting, diarrhea, bright red blood per rectum, melena, or hematemesis Neurologic: negative for visual changes, syncope, or dizziness All other systems reviewed and are otherwise  negative except as noted above.    Blood pressure 118/74, pulse 71, height 5\' 11"  (1.803 m), weight 328 lb (148.78 kg), SpO2 98 %.  General appearance: alert, cooperative and no distress Neck: no carotid bruit and no JVD Lungs: clear to auscultation bilaterally Heart: regular rate and rhythm, S1, S2 normal, no murmur, click, rub or gallop Extremities: no LEE Pulses: 2+ and symmetric Skin: warm and dry Neurologic: Grossly normal  EKG NSR 68 bpm   ASSESSMENT AND PLAN:   1. Paroxsymal Atrial Flutter: s/p recent DCCV 02/26/16. Maintaining NSR. HR is controlled. Patient is fully compliant with Eliquis. Continue Cardizem and metoprolol for rate control and Eliquis for stroke prophylaxis. If any recurrence, we may need to change treatment strategy and consider AAD therapy vs ablation.   2. CAD s/p CABG x1V (12/2015) : Denies anginal symptoms. Not a candidate for DAPT due to chronic thrombocytopenia.   3. ? Systolic HF: recent 2D echo 12/2015 showed normal EF of 60-65% however recent TEE showed EF of 35-40%. ? accuracies of TEE EF estimate. He is on Cardizem for rate control for PAF. I will order a repeat limited echo in 1 month to reassess EF. If still low, he may need to discontinue Cardizem. This will be determined by Dr. Meda [name] at time of next f/u 04/07/16. He is euvolemic on exam. Continue lasix, daily weights and low sodium diet.    PLAN  Keep f/u with Dr. Meda [name] in 1 month.  Sean Jester PA-C 02/28/2016 1:38 PM

## 2016-02-28 NOTE — Patient Instructions (Signed)
Medication Instructions:  Your physician recommends that you continue on your current medications as directed. Please refer to the Current Medication list given to you today.   Labwork: None ordered  Testing/Procedures: Your physician has requested that you have an echocardiogram. Echocardiography is a painless test that uses sound waves to create images of your heart. It provides your doctor with information about the size and shape of your heart and how well your heart's chambers and valves are working. This procedure takes approximately one hour. There are no restrictions for this procedure. ( To be scheduled in 4 weeks prior to appt with Dr.Nelson)  Follow-Up: Follow as planned with Dr.Nelson in May 2017  Any Other Special Instructions Will Be Listed Below (If Applicable).     If you need a refill on your cardiac medications before your next appointment, please call your pharmacy.

## 2016-03-03 NOTE — Addendum Note (Signed)
Addended by: Freada Bergeron on: 03/03/2016 08:52 AM   Modules accepted: Orders

## 2016-03-07 ENCOUNTER — Other Ambulatory Visit: Payer: Self-pay | Admitting: *Deleted

## 2016-03-07 DIAGNOSIS — G4733 Obstructive sleep apnea (adult) (pediatric): Secondary | ICD-10-CM

## 2016-03-24 ENCOUNTER — Encounter: Payer: Self-pay | Admitting: Thoracic Surgery (Cardiothoracic Vascular Surgery)

## 2016-03-24 ENCOUNTER — Ambulatory Visit (INDEPENDENT_AMBULATORY_CARE_PROVIDER_SITE_OTHER): Payer: Self-pay | Admitting: Thoracic Surgery (Cardiothoracic Vascular Surgery)

## 2016-03-24 VITALS — BP 122/83 | HR 76 | Resp 16 | Ht 71.0 in | Wt 326.0 lb

## 2016-03-24 DIAGNOSIS — Z951 Presence of aortocoronary bypass graft: Secondary | ICD-10-CM

## 2016-03-24 NOTE — Progress Notes (Signed)
[address]       [place] [address]             [phone]     CARDIOTHORACIC SURGERY OFFICE NOTE  Referring Provider is Bensimhon, Shaune Pascal, MD  Primary Cardiologist is Ena Dawley, MD PCP is [name], MD   HPI:  Patient returns to the office today for routine follow-up approximately 3 months status post off-pump coronary artery bypass grafting 1 on 12/26/2015. He was last seen here in our office on 01/28/2016. He developed recurrent persistent atrial flutter for which he underwent DC cardioversion On 2 occasions, most recently on 02/26/2016. He was seen in follow-up most recently by Lyda Jester at [facility] 2 days later. He returns for office for follow-up today. He states that he is feeling well. He has not had any symptoms of tachycardia palpitations to suggest a recurrence of atrial flutter. He remains chronically anticoagulated using Eliquis.  He has started the outpatient cardiac rehabilitation program close to his home in [place]. Overall he has no complaints.   Current Outpatient Prescriptions  Medication Sig Dispense Refill  . allopurinol (ZYLOPRIM) 100 MG tablet Take 1 tablet (100 mg total) by mouth daily. 30 tablet 1  . apixaban (ELIQUIS) 5 MG TABS tablet Take 1 tablet (5 mg total) by mouth 2 (two) times daily. 60 tablet 11  . aspirin 81 MG tablet Take 1 tablet (81 mg total) by mouth daily.    Marland Kitchen diltiazem (CARDIZEM CD) 180 MG 24 hr capsule Take 1 capsule (180 mg total) by mouth daily. 90 capsule 3  . furosemide (LASIX) 40 MG tablet Take 1 tablet (40 mg total) by mouth daily. 30 tablet 3  . metoprolol succinate (TOPROL XL) 25 MG 24 hr tablet Take 1 tablet (25 mg total) by mouth daily. 90 tablet 3  . nitroGLYCERIN (NITROSTAT) 0.4 MG SL tablet Place 1 tablet (0.4 mg total) under the tongue every 5 (five) minutes as needed for chest pain. 25 tablet 12  . omeprazole (PRILOSEC) 20 MG capsule Take 20 mg by mouth daily.    . potassium  chloride (K-DUR,KLOR-CON) 10 MEQ tablet Take 2 tablets (20 mEq total) by mouth daily. 60 tablet 3  . rosuvastatin (CRESTOR) 40 MG tablet Take 1 tablet (40 mg total) by mouth daily at 6 PM. 30 tablet 3   No current facility-administered medications for this visit.      Physical Exam:   BP 122/83 mmHg  Pulse 76  Resp 16  Ht 5\' 11"  (1.803 m)  Wt 326 lb (147.873 kg)  BMI 45.49 kg/m2  SpO2 97%  General:  Morbidly obese but well appearing  Chest:   Clear to auscultation  CV:   Regular rate and rhythm without murmur  Incisions:  Healing nicely, sternum is stable  Abdomen:  Soft nontender  Extremities:  Warm and well-perfused  Diagnostic Tests:  n/a   Impression:  Patient appears clinically stable approximately 3 months status post coronary artery bypass grafting 1.    Plan:  We have not recommended any changes to the patient's current medications. The patient has been encouraged to continue to participate in the outpatient cardiac rehabilitation program. He has been encouraged to gradually increase his physical activity without any particular limitations. He may return to work when he has been cleared by Dr. Meda Coffee and colleagues with regards to his heart rhythm management. The patient will return for routine follow-up next January, approximately 1 year following his original  surgery. He will call and return sooner should specific problems or difficulties arise.  I spent in excess of 10 minutes during the conduct of this office consultation and >50% of this time involved direct face-to-face encounter with the patient for counseling and/or coordination of their care.   Valentina Gu. Roxy Manns, MD 03/24/2016 2:43 PM

## 2016-03-24 NOTE — Patient Instructions (Addendum)
Continue all previous medications without any changes at this time  You may resume unrestricted physical activity without any particular limitations at this time.   

## 2016-03-31 ENCOUNTER — Other Ambulatory Visit: Payer: Self-pay

## 2016-03-31 ENCOUNTER — Ambulatory Visit (HOSPITAL_COMMUNITY): Payer: BLUE CROSS/BLUE SHIELD | Attending: Cardiovascular Disease

## 2016-03-31 DIAGNOSIS — I4892 Unspecified atrial flutter: Secondary | ICD-10-CM | POA: Diagnosis present

## 2016-03-31 DIAGNOSIS — E785 Hyperlipidemia, unspecified: Secondary | ICD-10-CM | POA: Diagnosis not present

## 2016-03-31 DIAGNOSIS — I10 Essential (primary) hypertension: Secondary | ICD-10-CM | POA: Insufficient documentation

## 2016-03-31 DIAGNOSIS — R001 Bradycardia, unspecified: Secondary | ICD-10-CM | POA: Diagnosis not present

## 2016-03-31 DIAGNOSIS — I2510 Atherosclerotic heart disease of native coronary artery without angina pectoris: Secondary | ICD-10-CM | POA: Diagnosis not present

## 2016-03-31 MED ORDER — PERFLUTREN LIPID MICROSPHERE
1.0000 mL | INTRAVENOUS | Status: AC | PRN
  Administered 2016-03-31: 4 mL via INTRAVENOUS

## 2016-04-02 ENCOUNTER — Telehealth: Payer: Self-pay | Admitting: Cardiology

## 2016-04-02 NOTE — Telephone Encounter (Signed)
Ronalee Belts, from [facility] calling Dr Meda Coffee to inform her that he just sent an EKG over on this pt, in regards to his cardiac rehab visit from today.  Per Ronalee Belts, the pt currently has an URI and is being treated for this by his PCP.  Per Ronalee Belts, the pt complained on and off through rehab, that he felt a "bear squeezing around his chest." Ronalee Belts states that the pt was in no acute distress and VS were WNL's. . Per Ronalee Belts, he did do a 12 lead EKG, and faxed this over for Dr Meda Coffee to review, upon return to the office.  Per Ronalee Belts, pt was in NSR, but did have noted T-Wave inversion.  Ronalee Belts states that its hard to say if the pts complaints at rehab correlate with his current URI or is cardiac- in -nature.  Ronalee Belts states that the pt will be seeing Dr Meda Coffee in the clinic on next Monday 04/07/16 at 1100, for follow-up of recent CABG.  Ronalee Belts states that at "their hospital, they cannot see the pt back in cardiac rehab, until Dr Meda Coffee advises so." Julio Sicks that when he faxes the pts EKG over to our office for Dr Meda Coffee to review, then he should also send a Progress Note along with it, stating how the pts visit went, VS, EKG, and current symptoms during rehab.  Julio Sicks to put in there that they will need for Dr Meda Coffee to advise on clearance for this pt to continue cardiac rehab, once she see's him in the office on 04/07/16.  Informed Ronalee Belts at [facility], that Dr Meda Coffee is currently out of the office this afternoon, but will forward this message to her for further review and recommendation, and follow-up with him thereafter. Ronalee Belts verbalized understanding and agrees with this plan.

## 2016-04-02 NOTE — Telephone Encounter (Signed)
New message    Cardiac rehab calling patient was seen in cardiac rehab today. EKG will be fax over today.

## 2016-04-03 IMAGING — CR DG CHEST 1V PORT
1 series · 1 of 1 positions shown · non-contrast
Comparison: Chest radiograph performed 01/28/2016

CLINICAL DATA: Acute onset of shortness of breath, tachycardia and
indigestion. Nausea. Initial encounter.

EXAM:
PORTABLE CHEST 1 VIEW

[AP]
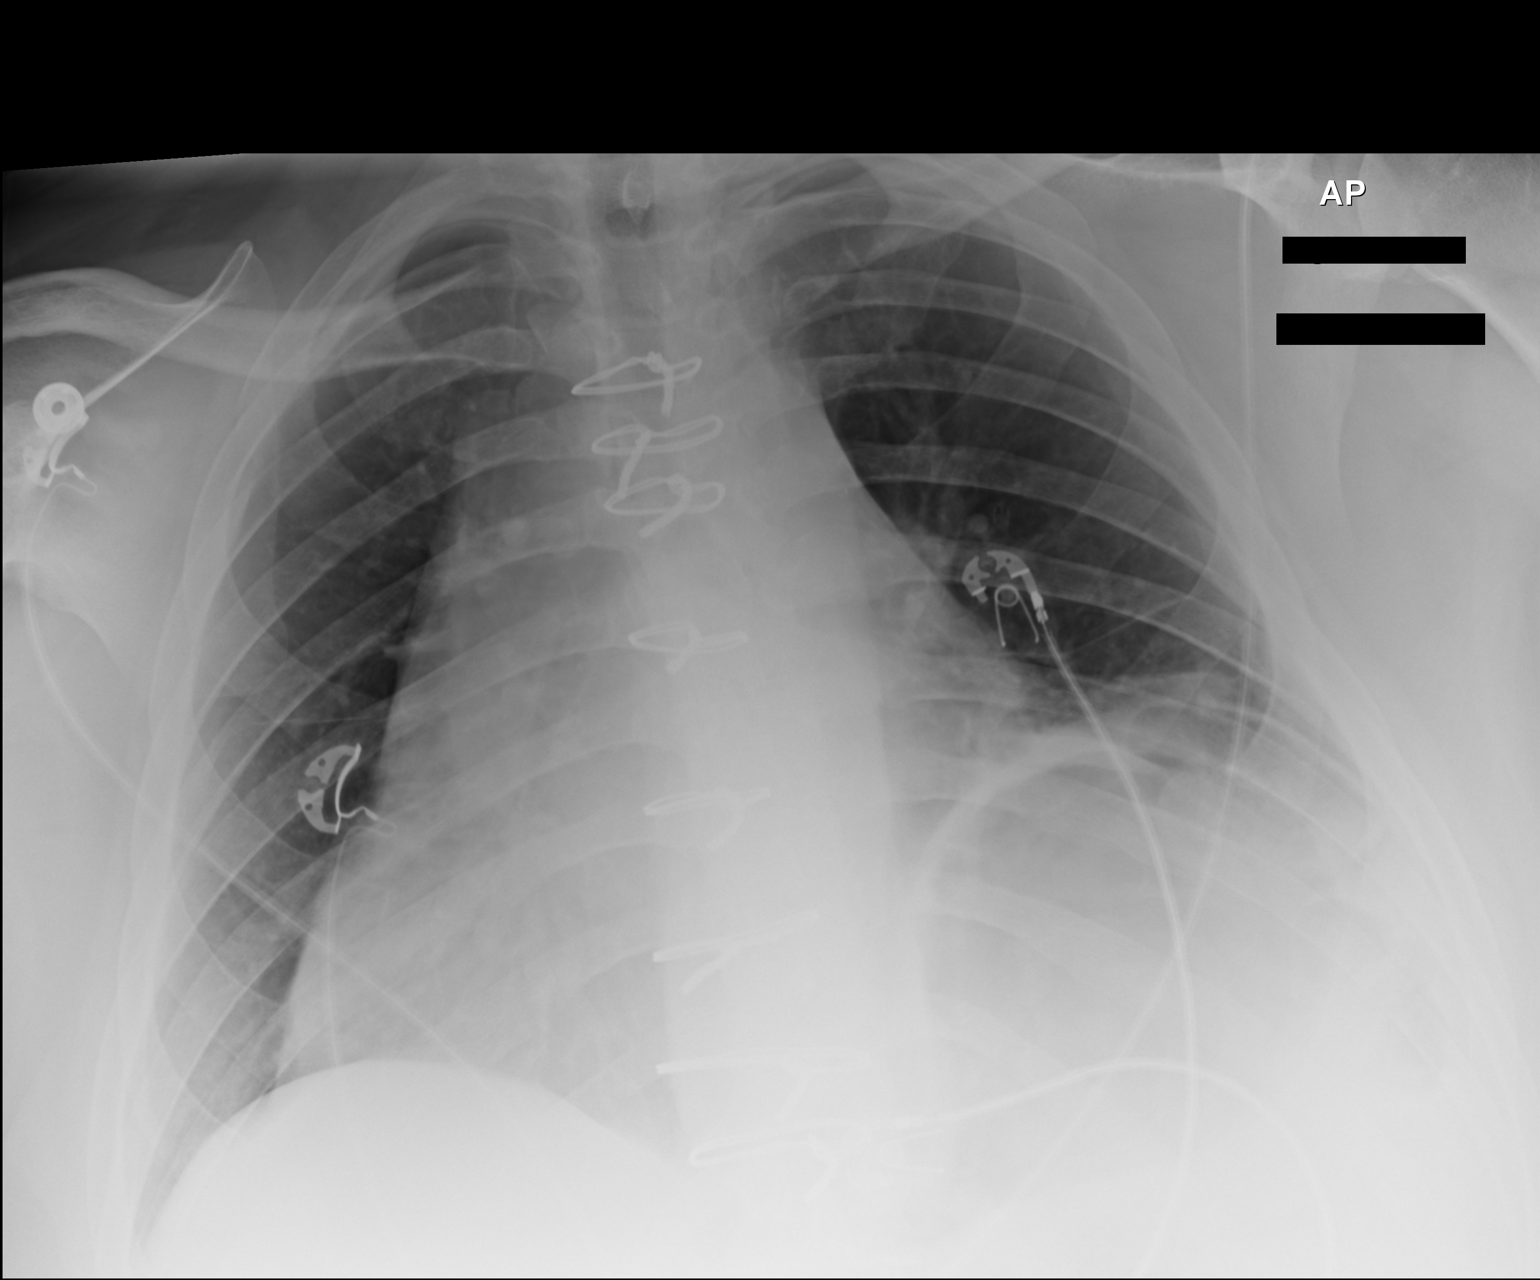

[1 of 1 positions shown; findings below may reference images not displayed]

FINDINGS: There is elevation of the left hemidiaphragm, with mild left basilar
atelectasis. There is no evidence of pleural effusion or
pneumothorax.

The cardiomediastinal silhouette is mildly enlarged. The patient is
status post median sternotomy. No acute osseous abnormalities are
seen.
IMPRESSION: Elevation of the left hemidiaphragm, with mild left basilar
atelectasis. Mild cardiomegaly.

## 2016-04-03 NOTE — Telephone Encounter (Signed)
The patient has new negative T waves in the inferior leads, we will schedule a Lexiscan nuclear stress test before restarting cardiac rehab.

## 2016-04-03 NOTE — Telephone Encounter (Signed)
Spoke with Dr Meda Coffee about this.  Since we will be seeing the pt on Monday 04/07/16, Dr Meda Coffee will see him and we will order this test then, based on his EKG at that visit. Clearance for cardiac rehab will be determined based on that visit.

## 2016-04-07 ENCOUNTER — Encounter: Payer: Self-pay | Admitting: Cardiology

## 2016-04-07 ENCOUNTER — Ambulatory Visit (INDEPENDENT_AMBULATORY_CARE_PROVIDER_SITE_OTHER): Payer: BLUE CROSS/BLUE SHIELD | Admitting: Cardiology

## 2016-04-07 VITALS — BP 100/70 | HR 69 | Ht 71.0 in | Wt 320.4 lb

## 2016-04-07 DIAGNOSIS — E785 Hyperlipidemia, unspecified: Secondary | ICD-10-CM | POA: Diagnosis not present

## 2016-04-07 DIAGNOSIS — I10 Essential (primary) hypertension: Secondary | ICD-10-CM

## 2016-04-07 DIAGNOSIS — J019 Acute sinusitis, unspecified: Secondary | ICD-10-CM | POA: Insufficient documentation

## 2016-04-07 DIAGNOSIS — I2510 Atherosclerotic heart disease of native coronary artery without angina pectoris: Secondary | ICD-10-CM | POA: Insufficient documentation

## 2016-04-07 DIAGNOSIS — Z951 Presence of aortocoronary bypass graft: Secondary | ICD-10-CM | POA: Diagnosis not present

## 2016-04-07 DIAGNOSIS — J018 Other acute sinusitis: Secondary | ICD-10-CM | POA: Diagnosis not present

## 2016-04-07 DIAGNOSIS — Z880 Allergy status to penicillin: Secondary | ICD-10-CM | POA: Insufficient documentation

## 2016-04-07 DIAGNOSIS — R9431 Abnormal electrocardiogram [ECG] [EKG]: Secondary | ICD-10-CM | POA: Diagnosis not present

## 2016-04-07 DIAGNOSIS — I4892 Unspecified atrial flutter: Secondary | ICD-10-CM

## 2016-04-07 MED ORDER — DOXYCYCLINE HYCLATE 50 MG PO CAPS: 100.0000 mg | ORAL_CAPSULE | Freq: Two times a day (BID) | ORAL | Status: DC

## 2016-04-07 NOTE — Progress Notes (Signed)
04/07/2016 Sean [name]   1971/05/10  [id number]  Primary Physician [name], MD Primary Cardiologist: Dr. 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   He presented back to [facility] on 02/11/16 with atrial flutter w/ RVR. IV Cardizem initiated for rate control. He was continued on Eliquis. He underwent successful TEE (negative for LA thrombus), followed by DCCV back to NSR. EF was estimated at 35-40%.   [facility] with recurrent atrial flutter w/ RVR. Since he had been on Eliquis anticoagulation, he underwent successful DC cardioversion with restoration of sinus rhythm. He was seen in ED by Dr. Claiborne Billings who increased his Cardizem CD to 180 mg. He also changed his BB from atenolol to Toprol-XL 25 g daily with plans to further titrate this as blood pressure and heart rate allow.He also advised that he improve compliance with his CPAP and undergo a repeat sleep study.  04/07/2016 - the patient was doing well until last week when he developed fevers chills or facial pain with congestion and nasal drainage per rectum of yellowish mucus. He was started on Augmentin for acute sinusitis. He was in rehabilitation when he had all these symptoms and on minimal exertion felt like somebody was sitting on his chest also felt significantly short of breath. This has been going on for several days and we have performed EKG that showed new negative T waves in inferior leads and he was referred back to [place]. He states that today his first  day when he finally feels better he denies any chest pain he hasn't been particularly active but no fever or chills no chest pain while walking from the car to the clinic.  He is complaining of significant itching all over his body ever since he started to take Augmentin. His mom is allergic to penicillin.   Current Outpatient Prescriptions  Medication Sig Dispense Refill  . allopurinol (ZYLOPRIM) 100 MG tablet Take 1 tablet (100 mg total) by mouth daily. 30 tablet 1  . apixaban (ELIQUIS) 5 MG TABS tablet Take 1 tablet (5 mg total) by mouth 2 (two) times daily. 60 tablet 11  . aspirin 81 MG tablet Take 1 tablet (81 mg total) by mouth daily.    Marland Kitchen diltiazem (CARDIZEM CD) 180 MG 24 hr capsule Take 1 capsule (180 mg total) by mouth daily. 90 capsule 3  . furosemide (LASIX) 40 MG tablet Take 1 tablet (40 mg total) by mouth daily. 30 tablet 3  . metoprolol succinate (TOPROL XL) 25 MG 24 hr tablet Take 1 tablet (25 mg total) by mouth daily. 90 tablet 3  . nitroGLYCERIN (NITROSTAT) 0.4 MG SL tablet Place 1 tablet (0.4 mg total) under the tongue every 5 (five) minutes as needed for chest pain. 25 tablet 12  . omeprazole (PRILOSEC) 20 MG capsule Take 20 mg by mouth daily.    . potassium chloride (K-DUR,KLOR-CON) 10 MEQ tablet Take 2 tablets (20 mEq total) by mouth daily. 60 tablet 3  . rosuvastatin (CRESTOR) 40 MG tablet Take 1 tablet (40 mg total) by mouth daily at  6 PM. 30 tablet 3   No current facility-administered medications for this visit.    No Known Allergies  Social History   Social History  . Marital Status: Legally Separated    Spouse Name: N/A  . Number of Children: N/A  . Years of Education: N/A   Occupational History  . Truck Geophysicist/field seismologist    Social History Main Topics  . Smoking status: Never Smoker   . Smokeless tobacco: Not on file  . Alcohol Use: Yes  . Drug Use: No  . Sexual Activity: Not on file   Other Topics Concern  . Not on file   Social History Narrative      Review of Systems: General: negative for chills, fever, night sweats or weight changes.  Cardiovascular: negative for chest pain, dyspnea on exertion, edema, orthopnea, palpitations, paroxysmal nocturnal dyspnea or shortness of breath Dermatological: negative for rash Respiratory: negative for cough or wheezing Urologic: negative for hematuria Abdominal: negative for nausea, vomiting, diarrhea, bright red blood per rectum, melena, or hematemesis Neurologic: negative for visual changes, syncope, or dizziness All other systems reviewed and are otherwise negative except as noted above.    Blood pressure 100/70, pulse 69, height 5\' 11"  (1.803 m), weight 320 lb 6.4 oz (145.332 kg).  General appearance: alert, cooperative and no distress Neck: no carotid bruit and no JVD Lungs: clear to auscultation bilaterally Heart: regular rate and rhythm, S1, S2 normal, no murmur, click, rub or gallop Extremities: no LEE Pulses: 2+ and symmetric Skin: warm and dry Neurologic: Grossly normal  TTE: 12/24/2015 Left ventricle: The cavity size was normal. There was mild concentric hypertrophy. Systolic function was normal. The estimated ejection fraction was in the range of 60% to 65%. Wall motion was normal; there were no regional wall motion abnormalities. The study is not technically sufficient to allow evaluation of LV diastolic function.  TTE: 03/31/2016  - Procedure narrative: Transthoracic echocardiography. Image  quality was suboptimal. The study was technically difficult.  Intravenous contrast (Definity) was administered. - Left ventricle: The cavity size was normal. Wall thickness was  normal. Systolic function was mildly reduced. The estimated  ejection fraction was in the range of 45% to 50%. Features are  consistent with a pseudonormal left ventricular filling pattern,  with concomitant abnormal relaxation and increased filling  pressure (grade 2 diastolic  dysfunction).  Impressions: - The echo is extremely difficult. The views of the LV with  Definity contrast shows that the LV function is likely normal.  Specific wall motin information is not able to be determined by  this echo.  No specific valvular information can be stated on this echo  EKG NSR 68 bpm     ASSESSMENT AND PLAN:   1. Abnormal EKG - EKG done in rehabilitation last week and today in the clinic shows sinus rhythm with new negative T waves in the inferior leads, the patient states that he was doing perfectly well until the episode of acute sinusitis and with antibiotics he's feeling almost back to normal. I would complete antibiotic therapy for 10 days and see the patient in 10 days to see if his symptoms persist. If he is asymptomatic I would continue aggressive management for his known coronary artery disease, considering he has CK D stage II and recently underwent 1 vessel bypass disease for right coronary artery only had minimal luminal irregularities. However if his symptoms of exertional chest pain persist and EKG changes persist with that I would be inclined to schedule another cardiac  catheterization. Considering his size he stress test would most probably be suboptimal. I have personally reviewed his echo cardiogram from 03/31/2016 that states that he's LVEF has decreased from prior, however his images are so poor that despite definitive use it's impossible to assess his LVEF and regional wall motion abnormalities. The other option for this patient would be scheduling a stress MRI that would assess post LV function and wall motion abnormalities and possible ischemia.  2. Paroxsymal Atrial Flutter: s/p recent DCCV 02/26/16. Maintaining NSR. HR is controlled. Patient is fully compliant with Eliquis. Continue Cardizem and metoprolol for rate control and Eliquis for stroke prophylaxis. If any recurrence, we may need to change treatment strategy and consider AAD therapy vs  ablation. Currently asymptomatic no recurrent episodes.  3. CAD s/p CABG x1V (12/2015) : As above. Not a candidate for DAPT due to chronic thrombocytopenia.   4. ? Systolic HF: We're unable to tell his LVEF on echocardiogram as his images are so poor, will be inclined to schedule an MRI in the future. He is euvolemic on exam. Continue lasix, daily weights and low sodium diet.   5. Penicillin allergy - we will listed in on his allergies, will discontinue Augmentin today and start doxycycline 100 mg by mouth twice a day for next 5 days to complete 10 day course of antibiotics for acute sinusitis.  6. Acute sinusitis - as above  Plan: follow up with FLEX in 10 days.    Dorothy Spark PA-C 04/07/2016 11:42 AM

## 2016-04-07 NOTE — Patient Instructions (Signed)
Medication Instructions:   STOP TAKING AUGMENTIN (AMOXICILLIN) NOW PER DR NELSON FOR RECENT ALLERGIC REACTION   START TAKING DOXYCYCLINE 100 MG TWICE DAILY FOR ACUTE SINUSITIS      Follow-Up:  1 WEEK ON THE FLEX SCHEDULE OR WITH AN EXTENDER IN OUR OFFICE FOR FOLLOW-UP OF ACUTE SINUSITIS AND NEEDING TO BE SCHEDULED FOR A CARDIAC CATHETERIZATION       If you need a refill on your cardiac medications before your next appointment, please call your pharmacy.

## 2016-04-14 ENCOUNTER — Encounter: Payer: Self-pay | Admitting: Nurse Practitioner

## 2016-04-14 ENCOUNTER — Ambulatory Visit (INDEPENDENT_AMBULATORY_CARE_PROVIDER_SITE_OTHER): Payer: BLUE CROSS/BLUE SHIELD | Admitting: Nurse Practitioner

## 2016-04-14 VITALS — BP 110/68 | HR 70 | Ht 71.0 in | Wt 322.8 lb

## 2016-04-14 DIAGNOSIS — E785 Hyperlipidemia, unspecified: Secondary | ICD-10-CM | POA: Diagnosis not present

## 2016-04-14 DIAGNOSIS — R9431 Abnormal electrocardiogram [ECG] [EKG]: Secondary | ICD-10-CM | POA: Diagnosis not present

## 2016-04-14 DIAGNOSIS — I10 Essential (primary) hypertension: Secondary | ICD-10-CM | POA: Diagnosis not present

## 2016-04-14 DIAGNOSIS — I2510 Atherosclerotic heart disease of native coronary artery without angina pectoris: Secondary | ICD-10-CM

## 2016-04-14 NOTE — Patient Instructions (Addendum)
We will be checking the following labs today - NONE   Medication Instructions:    Continue with your current medicines.     Testing/Procedures To Be Arranged:  N/A  Follow-Up:   See Dr. Meda Coffee in about 4 weeks.     Other Special Instructions:   Ok to return to cardiac rehab.     If you need a refill on your cardiac medications before your next appointment, please call your pharmacy.   Call the [place] office at [phone] if you have any questions, problems or concerns.

## 2016-04-14 NOTE — Progress Notes (Signed)
CARDIOLOGY OFFICE NOTE  Date:  04/14/2016    Sean [name] Date of Birth: [birth year] Medical Record [id number]  PCP:  [name], MD  Cardiologist:  Meda Coffee    Chief Complaint  Patient presents with  . Irregular Heart Beat    1 week check - seen for Dr. Meda Coffee  . Hypertension  . Hyperlipidemia    History of Present Illness: Sean [name] is a 45 y.o. male who presents today for a one week check. Seen for Dr. Meda Coffee.   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   He presented back to [facility] on 02/11/16 with atrial flutter w/ RVR. IV Cardizem initiated for rate control. He was continued on Eliquis. He underwent successful TEE (negative for LA thrombus), followed by DCCV back to NSR. EF was estimated at 35-40%.   He presented back to the [facility] ED on 02/26/16 with recurrent atrial flutter w/ RVR. Since he had been on Eliquis anticoagulation, he underwent successful DC cardioversion with restoration of sinus rhythm. He was seen in ED by Dr. Claiborne Billings who increased his Cardizem CD to 180 mg. He also changed his BB from atenolol to Toprol-XL 25 g daily with plans to further titrate this as blood pressure and heart rate allow.He also advised that he improve compliance with his CPAP and undergo a repeat sleep study.  Seen a week ago and was noted to be doing well until the week prior when he developed fever, chills, facial pain with congestion and nasal drainage. This all started while he was in rehab and he also noted a feeling on minimal exertion where he felt like somebody was sitting on his chest and also felt  significantly short of breath.  EKG last week showed new negative T waves in inferior leads and he was referred back to [place]. At his visit here, he noted that he was feeling better.   Comes back today. Here alone. He is quite talkative. He feels ok. Congestion is resolved. No more chest pain and is not short of breath. No palpitations. Wanting to return to cardiac rehab. Has not returned to work and apparently was going to finish rehab prior to returning to driving. Tolerating his medicines. Not dizzy.   Past Medical History  Diagnosis Date  . Hypercholesteremia     a. Prev taken off statin due to abnormal liver function.  . Hypertension   . Morbid obesity ([place])   . Abnormal liver enzymes     a. Sees a doctor in [place].  . Thrombocytopenia ([place])   . Hematuria     a. Chronic hx of this, no prior etiology determined through workup per patient.  . Sinus bradycardia   . 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 Coronary artery disease   . Atrial flutter with rapid ventricular response ([place]) 02/12/2016    Past Surgical History  Procedure Laterality Date  . Gallbladder surgery    . Appendectomy    . Hernia repair    . Cardiac catheterization N/A 12/24/2015    Procedure: Right/Left Heart Cath and Coronary Angiography;  Surgeon: Jolaine Artist, MD;  Location: [facility];  Service: Cardiovascular;  Laterality: N/A;  . Coronary artery bypass graft N/A 12/26/2015    Procedure: Off Pump Coronary Artery Bypass Grafting times one using left internal mammary artery;  Surgeon: Rexene Alberts, MD;  Location: [place];  Service: Open Heart Surgery;  Laterality: N/A;  . Tee without cardioversion N/A 12/26/2015    Procedure: TRANSESOPHAGEAL ECHOCARDIOGRAM (TEE);  Surgeon: Rexene Alberts, MD;  Location: [place];  Service: Open Heart Surgery;  Laterality: N/A;  . Cardioversion N/A 02/14/2016    Procedure: CARDIOVERSION;  Surgeon: Pixie Casino, MD;  Location: [facility] ENDOSCOPY;  Service:  Cardiovascular;  Laterality: N/A;  . Tee without cardioversion N/A 02/14/2016    Procedure: TRANSESOPHAGEAL ECHOCARDIOGRAM (TEE);  Surgeon: Pixie Casino, MD;  Location: [facility] ENDOSCOPY;  Service: Cardiovascular;  Laterality: N/A;     Medications: Current Outpatient Prescriptions  Medication Sig Dispense Refill  . allopurinol (ZYLOPRIM) 100 MG tablet Take 1 tablet (100 mg total) by mouth daily. 30 tablet 1  . apixaban (ELIQUIS) 5 MG TABS tablet Take 1 tablet (5 mg total) by mouth 2 (two) times daily. 60 tablet 11  . aspirin 81 MG tablet Take 1 tablet (81 mg total) by mouth daily.    Marland Kitchen diltiazem (CARDIZEM CD) 180 MG 24 hr capsule Take 1 capsule (180 mg total) by mouth daily. 90 capsule 3  . doxycycline (VIBRAMYCIN) 50 MG capsule Take 2 capsules (100 mg total) by mouth 2 (two) times daily. 20 capsule 0  . furosemide (LASIX) 40 MG tablet Take 1 tablet (40 mg total) by mouth daily. 30 tablet 3  . metoprolol succinate (TOPROL XL) 25 MG 24 hr tablet Take 1 tablet (25 mg total) by mouth daily. 90 tablet 3  . nitroGLYCERIN (NITROSTAT) 0.4 MG SL tablet Place 1 tablet (0.4 mg total) under the tongue every 5 (five) minutes as needed for chest pain. 25 tablet 12  . omeprazole (PRILOSEC) 20 MG capsule Take 20 mg by mouth daily.    . potassium chloride (K-DUR,KLOR-CON) 10 MEQ tablet Take 2 tablets (20 mEq total) by mouth daily. 60 tablet 3  . rosuvastatin (CRESTOR) 40 MG tablet Take 1 tablet (40 mg total) by mouth daily at 6 PM. 30 tablet 3   No current facility-administered medications for this visit.    Allergies: Allergies  Allergen Reactions  . Augmentin [Amoxicillin-Pot Clavulanate] Itching    Social History: The patient  reports that he has never smoked. He does not have any smokeless tobacco history on file. He reports that he drinks alcohol. He reports that he does not use illicit drugs.   Family History: The patient's family history includes CAD in his father.   Review of  Systems: Please see the history of present illness.   Otherwise, the review of systems is positive for none.   All other systems are reviewed and negative.   Physical Exam: VS:  BP 110/68 mmHg  Pulse 70  Ht 5\' 11"  (1.803 m)  Wt 322 lb 12.8 oz (146.421 kg)  BMI 45.04 kg/m2 .  BMI Body mass index is 45.04 kg/(m^2).  Wt Readings from Last 3 Encounters:  04/14/16 322 lb 12.8 oz (146.421 kg)  04/07/16 320 lb 6.4 oz (145.332 kg)  03/24/16 326 lb (147.873 kg)    General: Pleasant. Morbidly obese. He is quite talkative and in no acute distress.  HEENT: Normal. Neck: Supple, no JVD, carotid bruits, or masses noted.  Cardiac: Regular rate and rhythm. No murmurs, rubs, or gallops. No  edema.  Respiratory:  Lungs are clear to auscultation bilaterally with normal work of breathing.  GI: Soft and nontender.  MS: No deformity or atrophy. Gait and ROM intact. Skin: Warm and dry. Color is normal.  Neuro:  Strength and sensation are intact and no gross focal deficits noted.  Psych: Alert, appropriate and with normal affect.   LABORATORY DATA:  EKG:  EKG is ordered today. This shows NSR with just nonspecific T wave changes.   Lab Results  Component Value Date   WBC 7.8 02/26/2016   HGB 11.6* 02/26/2016   HCT 38.3* 02/26/2016   PLT 110* 02/26/2016   GLUCOSE 126* 02/26/2016   CHOL 183 12/24/2015   TRIG 149 12/24/2015   HDL 32* 12/24/2015   LDLCALC 121* 12/24/2015   ALT 30 02/11/2016   AST 29 02/11/2016   NA 142 02/26/2016   K 4.2 02/26/2016   CL 107 02/26/2016   CREATININE 1.36* 02/26/2016   BUN 20 02/26/2016   CO2 26 02/26/2016   TSH 3.432 02/12/2016   INR 1.21 02/13/2016   HGBA1C 5.6 12/23/2015    BNP (last 3 results) No results for input(s): BNP in the last 8760 hours.  ProBNP (last 3 results) No results for input(s): PROBNP in the last 8760 hours.   Other Studies Reviewed Today:  TTE: 12/24/2015 Left ventricle: The cavity size was normal. There was mild concentric  hypertrophy. Systolic function was normal. The estimated ejection fraction was in the range of 60% to 65%. Wall motion was normal; there were no regional wall motion abnormalities. The study is not technically sufficient to allow evaluation of LV diastolic function.  TTE: 03/31/2016  - Procedure narrative: Transthoracic echocardiography. Image  quality was suboptimal. The study was technically difficult.  Intravenous contrast (Definity) was administered. - Left ventricle: The cavity size was normal. Wall thickness was  normal. Systolic function was mildly reduced. The estimated  ejection fraction was in the range of 45% to 50%. Features are  consistent with a pseudonormal left ventricular filling pattern,  with concomitant abnormal relaxation and increased filling  pressure (grade 2 diastolic dysfunction).  Impressions: - The echo is extremely difficult. The views of the LV with  Definity contrast shows that the LV function is likely normal.  Specific wall motin information is not able to be determined by  this echo.  No specific valvular information can be stated on this echo   ASSESSMENT AND PLAN:   1. Abnormal EKG - EKG done in rehabilitation 2 weeks ago and repeated last week in the clinic shows sinus rhythm with new negative T waves in the inferior leads, the patient states that he was doing perfectly well until the episode of acute sinusitis and with antibiotics he's feeling almost back to normal. Dr. Meda Coffee wanted him to complete antibiotic therapy and then reassess.  If he is asymptomatic she wanted to continue aggressive management for his known coronary artery disease, considering he has CKD stage II and recently underwent 1 vessel bypass disease for right coronary artery only had minimal luminal irregularities.  However if his symptoms of exertional chest pain persist and EKG changes persist then she wanted to schedule another cardiac catheterization. Considering his  size he stress test would most probably be suboptimal. Dr. Meda Coffee personally noted to have reviewed his echo cardiogram from 03/31/2016 that stated that he's LVEF has decreased from prior, however his images are so poor that despite definitive use it's impossible to assess his LVEF and regional wall motion abnormalities.  The other option for this patient would be scheduling a stress MRI that would assess post LV function and wall motion abnormalities and possible ischemia.  His symptoms are resolved. I favor continued medical management. Will let him go back to cardiac rehab. He will discuss going back to work at his return [place] with Dr. Meda Coffee.   2. Paroxsymal Atrial Flutter: s/p recent DCCV 02/26/16. Maintaining NSR. HR is controlled. Patient is fully compliant with Eliquis. Continue Cardizem and metoprolol for rate control and Eliquis for stroke prophylaxis. If any recurrence, we may need to change treatment strategy and consider AAD therapy vs ablation. Currently asymptomatic no recurrent episodes.  3. CAD s/p CABG x1V (12/2015) : As above. Not a candidate for DAPT due to chronic thrombocytopenia. He is not having anymore chest pain and is not short of breath.   4. ? Systolic HF: We're unable to tell his LVEF on echocardiogram as his images are so poor, would defer possible MRI to Dr. Meda Coffee. He is euvolemic on exam. Continue lasix, daily weights and low sodium diet.   5. Acute sinusitis - resolved.   6. Morbid obesity  Current medicines are reviewed with the patient today.  The patient does not have concerns regarding medicines other than what has been noted above.  The following changes have been made:  See above.  Labs/ tests ordered today include:   No orders of the defined types were placed in this encounter.     Disposition:   FU with Dr. Meda Coffee in one month.   Patient is agreeable to this plan and will call if any problems develop in the interim.   Signed: Burtis Junes, RN,  ANP-C 04/14/2016 2:47 PM  [facility] [address] [place], [place]  [address] Phone: [phone] Fax: [phone]

## 2016-04-18 IMAGING — CR DG CHEST 1V PORT
1 series · 1 of 1 positions shown · non-contrast
Comparison: 02/11/2016

CLINICAL DATA: Shortness of breath.  Weakness.

EXAM:
PORTABLE CHEST 1 VIEW

[AP]
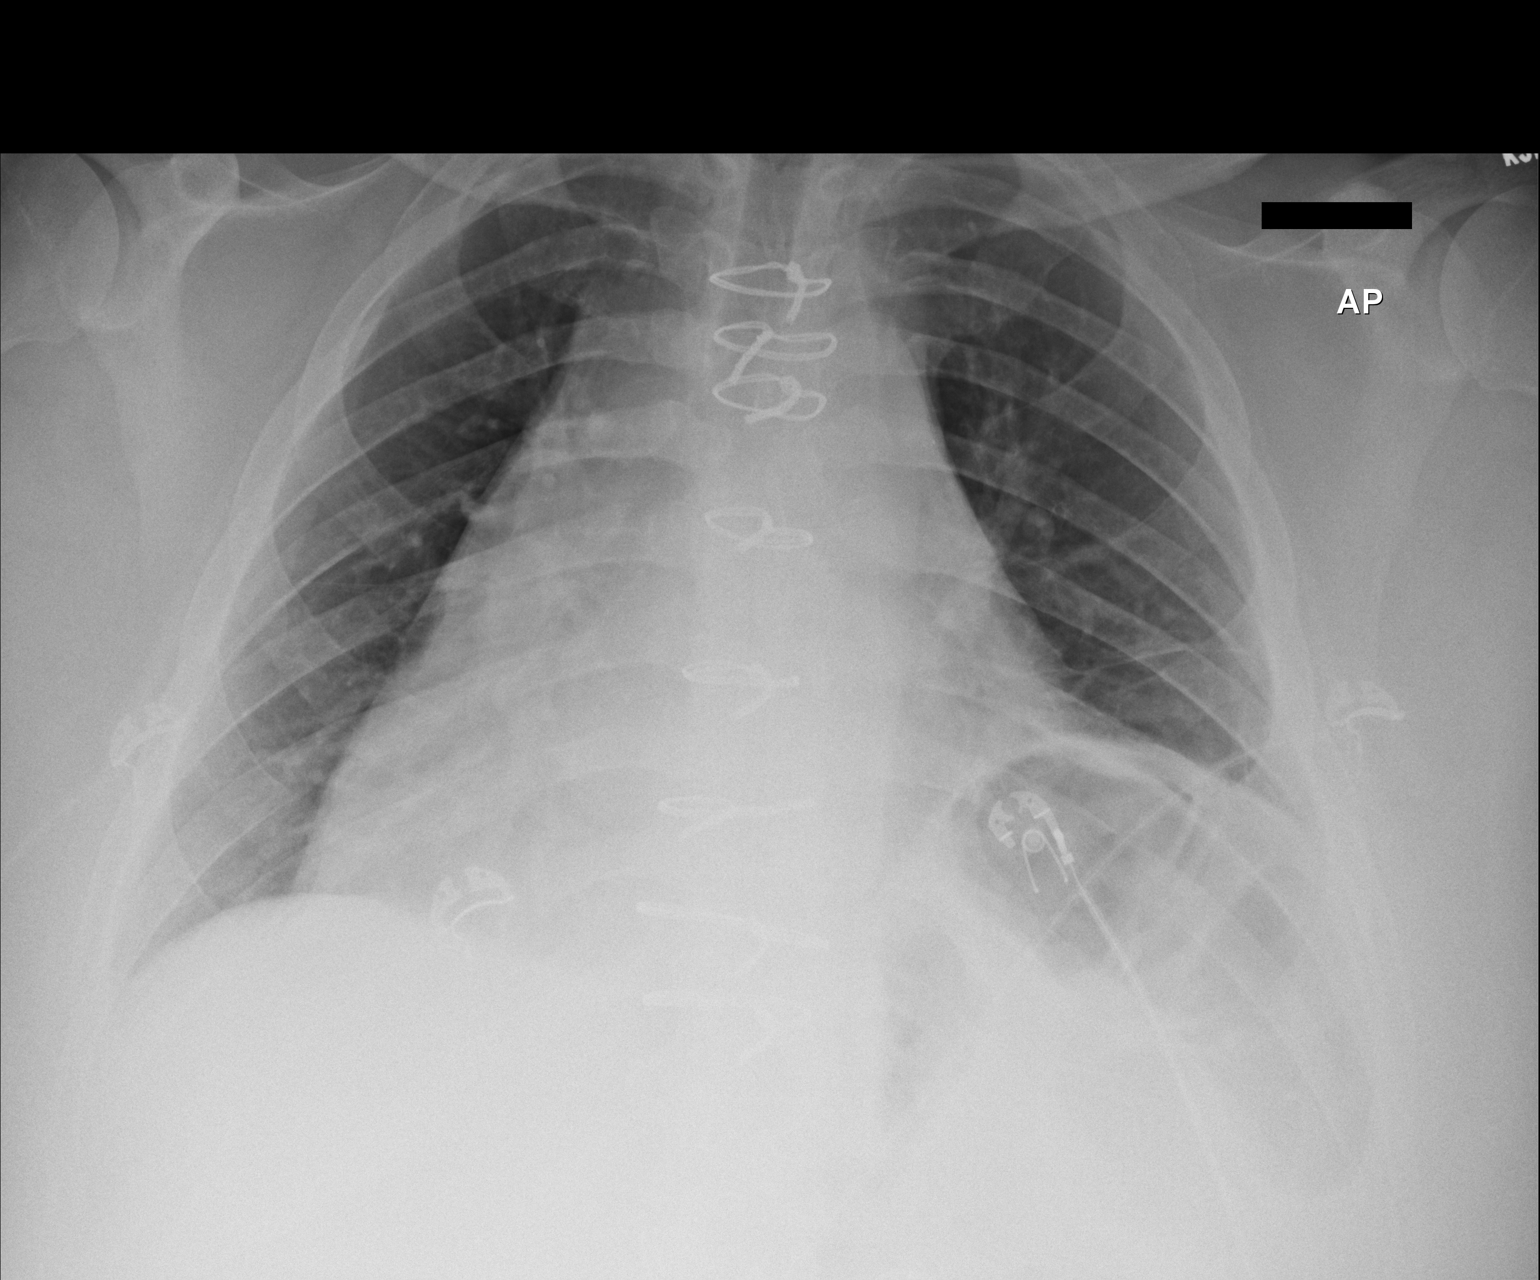

[1 of 1 positions shown; findings below may reference images not displayed]

FINDINGS: Bb median sternotomy and CABG procedure has been performed. Stable
cardiac enlargement. There is chronic asymmetric elevation of the
left hemidiaphragm with overlying scar/atelectasis. No superimposed
pulmonary edema or airspace consolidation. No evidence for pleural
effusion.
IMPRESSION: 1. Cardiac enlargement
2. Chronic asymmetric elevation of the left hemidiaphragm.

## 2016-05-05 ENCOUNTER — Ambulatory Visit (HOSPITAL_BASED_OUTPATIENT_CLINIC_OR_DEPARTMENT_OTHER): Payer: BLUE CROSS/BLUE SHIELD

## 2016-05-06 ENCOUNTER — Other Ambulatory Visit: Payer: Self-pay | Admitting: Cardiology

## 2016-05-06 ENCOUNTER — Other Ambulatory Visit: Payer: Self-pay | Admitting: Physician Assistant

## 2016-05-06 DIAGNOSIS — J40 Bronchitis, not specified as acute or chronic: Secondary | ICD-10-CM | POA: Insufficient documentation

## 2016-05-26 ENCOUNTER — Ambulatory Visit (INDEPENDENT_AMBULATORY_CARE_PROVIDER_SITE_OTHER): Payer: BLUE CROSS/BLUE SHIELD | Admitting: Cardiology

## 2016-05-26 ENCOUNTER — Encounter: Payer: Self-pay | Admitting: Cardiology

## 2016-05-26 VITALS — BP 124/70 | HR 56 | Ht 71.0 in | Wt 320.0 lb

## 2016-05-26 DIAGNOSIS — I2510 Atherosclerotic heart disease of native coronary artery without angina pectoris: Secondary | ICD-10-CM

## 2016-05-26 DIAGNOSIS — I10 Essential (primary) hypertension: Secondary | ICD-10-CM

## 2016-05-26 DIAGNOSIS — E785 Hyperlipidemia, unspecified: Secondary | ICD-10-CM

## 2016-05-26 DIAGNOSIS — I480 Paroxysmal atrial fibrillation: Secondary | ICD-10-CM

## 2016-05-26 DIAGNOSIS — Z951 Presence of aortocoronary bypass graft: Secondary | ICD-10-CM

## 2016-05-26 LAB — BASIC METABOLIC PANEL
BUN: 19 mg/dL (ref 7–25)
CO2: 29 mmol/L (ref 20–31)
Calcium: 9.1 mg/dL (ref 8.6–10.3)
Chloride: 103 mmol/L (ref 98–110)
Creat: 1.31 mg/dL (ref 0.60–1.35)
Glucose, Bld: 97 mg/dL (ref 65–99)
Potassium: 4 mmol/L (ref 3.5–5.3)
Sodium: 140 mmol/L (ref 135–146)

## 2016-05-26 NOTE — Progress Notes (Addendum)
05/26/2016 Sean [name]   09-01-1971  [id number]  Primary Physician [name], MD Primary Cardiologist: Dr. Meda Coffee  Reason for Visit/CC: [facility] F/u for Atrial Flutter  HPI:  Mr. 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   He presented back to [facility] on 02/11/16 with atrial flutter w/ RVR. IV Cardizem initiated for rate control. He was continued on Eliquis. He underwent successful TEE (negative for LA thrombus), followed by DCCV back to NSR. EF was estimated at 35-40%.   He presented back to the [facility] ED on 02/26/16 with recurrent atrial flutter w/ RVR. Since he had been on Eliquis anticoagulation, he underwent successful DC cardioversion with restoration of sinus rhythm. He was seen in ED by Dr. Claiborne Billings who increased his Cardizem CD to 180 mg. He also changed his BB from atenolol to Toprol-XL 25 g daily with plans to further titrate this as blood pressure and heart rate allow.He also advised that he improve compliance with his CPAP and undergo a repeat sleep study.  05/26/2016 - patient is coming after 6 weeks, he is very positive and thankful for everything we have done. He completed cardiac rehabilitation and recently joined a gym where he goes 5 times a week, he has already lost 25 pounds and feels significantly better, more energetic. He denies any chest pain or shortness of breath with exertion. No palpitations since the last visit and no syncope. He continues to wear compression stockings for chronic lower extremity edema. His only concern is itching after medication, not sure which one but suspicious of potassium  pills. It resolves with Pura Spice. He would like to go back to work. No bleeding with Coumadin.  Current Outpatient Prescriptions  Medication Sig Dispense Refill  . allopurinol (ZYLOPRIM) 100 MG tablet Take 1 tablet (100 mg total) by mouth daily. 30 tablet 1  . apixaban (ELIQUIS) 5 MG TABS tablet Take 1 tablet (5 mg total) by mouth 2 (two) times daily. 60 tablet 11  . aspirin 81 MG tablet Take 1 tablet (81 mg total) by mouth daily.    Marland Kitchen diltiazem (CARDIZEM CD) 180 MG 24 hr capsule Take 1 capsule (180 mg total) by mouth daily. 90 capsule 3  . furosemide (LASIX) 40 MG tablet Take 1 tablet (40 mg total) by mouth daily. 30 tablet 3  . metoprolol succinate (TOPROL XL) 25 MG 24 hr tablet Take 1 tablet (25 mg total) by mouth daily. 90 tablet 3  . montelukast (SINGULAIR) 10 MG tablet Take 1 tablet by mouth daily.  2  . nitroGLYCERIN (NITROSTAT) 0.4 MG SL tablet Place 1 tablet (0.4 mg total) under the tongue every 5 (five) minutes as needed for chest pain. 25 tablet 12  . omeprazole (PRILOSEC) 20 MG capsule Take 20 mg by mouth daily.    . potassium chloride (K-DUR,KLOR-CON) 10 MEQ tablet Take 2 tablets (20 mEq total) by mouth daily. 60 tablet 3  . rosuvastatin (CRESTOR) 40 MG tablet Take 1 tablet (40 mg total) by mouth daily. 30 tablet 0   No current facility-administered medications for this visit.   Allergies  Allergen Reactions  . Augmentin [Amoxicillin-Pot Clavulanate] Itching   Social History   Social History  .  Marital Status: Legally Separated    Spouse Name: N/A  . Number of Children: N/A  . Years of Education: N/A   Occupational History  . Truck Geophysicist/field seismologist    Social History Main Topics  . Smoking status: Never Smoker   . Smokeless tobacco: Not on file  . Alcohol Use: Yes  . Drug Use: No  . Sexual Activity: Not on file   Other Topics Concern  . Not on file   Social History Narrative    Review of Systems: General: negative for chills, fever, night sweats or weight changes.    Cardiovascular: negative for chest pain, dyspnea on exertion, edema, orthopnea, palpitations, paroxysmal nocturnal dyspnea or shortness of breath Dermatological: negative for rash Respiratory: negative for cough or wheezing Urologic: negative for hematuria Abdominal: negative for nausea, vomiting, diarrhea, bright red blood per rectum, melena, or hematemesis Neurologic: negative for visual changes, syncope, or dizziness All other systems reviewed and are otherwise negative except as noted above.  Blood pressure 124/70, pulse 56, height 5\' 11"  (1.803 m), weight 320 lb (145.151 kg), SpO2 98 %.  General appearance: alert, cooperative and no distress Neck: no carotid bruit and no JVD Lungs: clear to auscultation bilaterally Heart: regular rate and rhythm, S1, S2 normal, no murmur, click, rub or gallop Extremities: no LEE Pulses: 2+ and symmetric Skin: warm and dry Neurologic: Grossly normal  TTE: 12/24/2015 Left ventricle: The cavity size was normal. There was mild concentric hypertrophy. Systolic function was normal. The estimated ejection fraction was in the range of 60% to 65%. Wall motion was normal; there were no regional wall motion abnormalities. The study is not technically sufficient to allow evaluation of LV diastolic function.  TTE: 03/31/2016  - Procedure narrative: Transthoracic echocardiography. Image  quality was suboptimal. The study was technically difficult.  Intravenous contrast (Definity) was administered. - Left ventricle: The cavity size was normal. Wall thickness was  normal. Systolic function was mildly reduced. The estimated  ejection fraction was in the range of 45% to 50%. Features are  consistent with a pseudonormal left ventricular filling pattern,  with concomitant abnormal relaxation and increased filling  pressure (grade 2 diastolic dysfunction).  Impressions: - The echo is extremely difficult. The views of the LV with  Definity contrast  shows that the LV function is likely normal.  Specific wall motin information is not able to be determined by  this echo.  No specific valvular information can be stated on this echo  EKG NSR 68 bpm     ASSESSMENT AND PLAN:   1. CAD, s/p CABG x1, LIMA to LAD in January 2017. He is doing great continues to exercise and is asymptomatic. We will continue using aspirin, rosuvastatin, metoprolol. He is encouraged to continue exercising.  2. Paroxsymal Atrial Flutter: s/p recent DCCV 02/26/16. Maintaining NSR. HR is controlled. Patient is fully compliant with Eliquis. Continue Cardizem and metoprolol for rate control and Eliquis for stroke prophylaxis. If any recurrence, we may need to change treatment strategy and consider AAD therapy vs ablation. Currently asymptomatic no recurrent episodes.   3. CAD s/p CABG x1V (12/2015) : As above. Not a candidate for DAPT due to chronic thrombocytopenia.   4. ? Systolic HF: We're unable to tell his LVEF on echocardiogram as his images are so poor, will be inclined to schedule an MRI in the future. He is euvolemic on exam. Continue lasix, daily weights and low sodium diet.   5. Return to work note given to the patient today.  6. Itching post medication - we will check K today and possibly discontinue  Plan: follow up in 3 months.    Ena Dawley, MD 05/26/2016

## 2016-05-26 NOTE — Patient Instructions (Addendum)
**Note De-Identified  Obfuscation** Medication Instructions:  Same-no changes  Labwork: BMET today  Testing/Procedures: None  Follow-Up: Your physician recommends that you schedule a follow-up appointment in: 3 months      If you need a refill on your cardiac medications before your next appointment, please call your pharmacy.

## 2016-05-27 ENCOUNTER — Encounter: Payer: Self-pay | Admitting: *Deleted

## 2016-05-27 ENCOUNTER — Telehealth: Payer: Self-pay | Admitting: *Deleted

## 2016-05-27 ENCOUNTER — Telehealth: Payer: Self-pay | Admitting: Cardiology

## 2016-05-27 MED ORDER — POTASSIUM CHLORIDE ER 10 MEQ PO TBCR: 10.0000 meq | EXTENDED_RELEASE_TABLET | Freq: Every day | ORAL | Status: DC

## 2016-05-27 NOTE — Telephone Encounter (Signed)
-----   Message from Dorothy Spark, MD sent at 05/27/2016  4:53 PM EDT ----- To Whom It May Concern,   Mr. Sean [name] is currently under my care. 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   His left ventricular ejection fraction is currently 45-50%. He has no signs of angina or heart failure.  Please call us with any questions.  Sincerely,  Ena Dawley, MD Cardiologist

## 2016-05-27 NOTE — Telephone Encounter (Signed)
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

## 2016-05-27 NOTE — Telephone Encounter (Signed)
Notified the pt that per Dr Meda Coffee, all labs were normal, and she recommends that we cut back down on his K-dur to 10 mEq po daily only. Confirmed the pharmacy of choice with the pt.  Pt verbalized understanding and agrees with this plan.

## 2016-05-27 NOTE — Telephone Encounter (Signed)
Will fax this clearance letter per Dr Meda Coffee to Dr Truman Hayward and Barbaraann Rondo at [facility], at contact information as indicated below.

## 2016-05-27 NOTE — Telephone Encounter (Signed)
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  This needs to state he is cleared from a cardiac perspective and that his EF is acceptable and greater than 40 %.  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  This information should be faxed to Dr Truman Hayward and Sindy Messing [facility] at [phone] and telephone [phone]. Informed Dr Truman Hayward and Barbaraann Rondo that I will route this message to Dr Meda Coffee to review and addend clearance letter written yesterday, and fax this accordingly to information provided above, thereafter.  Both verbalized understanding and agrees with this plan.

## 2016-05-27 NOTE — Telephone Encounter (Signed)
-----   Message from Dorothy Spark, MD sent at 05/26/2016  6:46 PM EDT ----- All labs normal, he can cut down KCl to take  10 mEq once daily only.

## 2016-05-28 ENCOUNTER — Telehealth: Payer: Self-pay | Admitting: Cardiology

## 2016-05-28 NOTE — Telephone Encounter (Signed)
Follow Up:   Pt wants to know if you have faxed his clearance,his doctor is waiting on it please.

## 2016-05-28 NOTE — Telephone Encounter (Signed)
Notified the pt that I faxed his clearance letter per Dr Meda Coffee to [facility], attention Keely and Dr Truman Hayward multiple times, and with confirmation they received this.  Advised the pt to follow-up with their office to make sure they got this.  Advised the pt that if they have not, then they should call our office back and provide a working fax number.  Pt verbalized understanding and agrees with this plan.

## 2016-06-06 ENCOUNTER — Other Ambulatory Visit: Payer: Self-pay | Admitting: Cardiology

## 2016-08-13 ENCOUNTER — Encounter: Payer: Self-pay | Admitting: Cardiology

## 2016-08-28 ENCOUNTER — Encounter: Payer: Self-pay | Admitting: Cardiology

## 2016-08-28 ENCOUNTER — Ambulatory Visit (INDEPENDENT_AMBULATORY_CARE_PROVIDER_SITE_OTHER): Payer: BLUE CROSS/BLUE SHIELD | Admitting: Cardiology

## 2016-08-28 VITALS — BP 120/70 | HR 43 | Ht 71.0 in | Wt 320.0 lb

## 2016-08-28 DIAGNOSIS — I10 Essential (primary) hypertension: Secondary | ICD-10-CM

## 2016-08-28 DIAGNOSIS — I480 Paroxysmal atrial fibrillation: Secondary | ICD-10-CM

## 2016-08-28 DIAGNOSIS — E785 Hyperlipidemia, unspecified: Secondary | ICD-10-CM

## 2016-08-28 DIAGNOSIS — Z951 Presence of aortocoronary bypass graft: Secondary | ICD-10-CM

## 2016-08-28 DIAGNOSIS — I4892 Unspecified atrial flutter: Secondary | ICD-10-CM

## 2016-08-28 DIAGNOSIS — R001 Bradycardia, unspecified: Secondary | ICD-10-CM

## 2016-08-28 DIAGNOSIS — I2510 Atherosclerotic heart disease of native coronary artery without angina pectoris: Secondary | ICD-10-CM

## 2016-08-28 MED ORDER — DILTIAZEM HCL ER COATED BEADS 120 MG PO CP24: 120.0000 mg | ORAL_CAPSULE | Freq: Every day | ORAL | 3 refills | Status: DC

## 2016-08-28 NOTE — Progress Notes (Signed)
08/28/2016 Sean [name]   January 25, 1971  [id number]  Primary Physician [name], MD Primary Cardiologist: Dr. 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   He presented back to [facility] on 02/11/16 with atrial flutter w/ RVR. IV Cardizem initiated for rate control. He was continued on Eliquis. He underwent successful TEE (negative for LA thrombus), followed by DCCV back to NSR. EF was estimated at 35-40%.   He presented back to the [facility] ED on 02/26/16 with recurrent atrial flutter w/ RVR. Since he had been on Eliquis anticoagulation, he underwent successful DC cardioversion with restoration of sinus rhythm. He was seen in ED by Dr. Claiborne Billings who increased his Cardizem CD to 180 mg. He also changed his BB from atenolol to Toprol-XL 25 g daily with plans to further titrate this as blood pressure and heart rate allow.He also advised that he improve compliance with his CPAP and undergo a repeat sleep study.  05/26/2016 - patient is coming after 6 weeks, he is very positive and thankful for everything we have done. He completed cardiac rehabilitation and recently joined a gym where he goes 5 times a week, he has already lost 25 pounds and feels significantly better, more energetic. He denies any chest pain or shortness of breath with exertion. No palpitations since the last visit and no syncope. He continues to wear compression stockings for chronic lower extremity edema. His only concern is itching after medication, not sure which one but suspicious of potassium  pills. It resolves with Pura Spice. He would like to go back to work. No bleeding with Coumadin.  08/28/2016 - 3 months follow-up, he states that he had one episode of chest pain that lasted a few days after heavy work at home, the pain was related to certain position and eventually resolved. He has significant bruising in different parts of his body including breast and back. No blood in urine or stool. He is compliant with his meds tolerating Crestor well. He denies any orthopnea paroxysmal nocturnal dyspnea. No lower extremity edema no palpitations or syncope.   Current Outpatient Prescriptions  Medication Sig Dispense Refill  . allopurinol (ZYLOPRIM) 100 MG tablet Take 1 tablet (100 mg total) by mouth daily. 30 tablet 1  . apixaban (ELIQUIS) 5 MG TABS tablet Take 1 tablet (5 mg total) by mouth 2 (two) times daily. 60 tablet 11  . aspirin 81 MG tablet Take 1 tablet (81 mg total) by mouth daily.    Marland Kitchen diltiazem (CARDIZEM CD) 180 MG 24 hr capsule Take 1 capsule (180 mg total) by mouth daily. 90 capsule 3  . furosemide (LASIX) 40 MG tablet Take 1 tablet (40 mg total) by mouth daily. 30 tablet 3  . metoprolol succinate (TOPROL XL) 25 MG 24 hr tablet Take 1 tablet (25 mg total) by mouth daily. 90 tablet 3  . montelukast (SINGULAIR) 10 MG tablet Take 1 tablet by mouth daily.  2  . nitroGLYCERIN (NITROSTAT) 0.4 MG SL tablet Place 1 tablet (0.4 mg total) under the tongue every 5 (five) minutes as needed for chest pain. 25 tablet 12  . omeprazole (  PRILOSEC) 20 MG capsule Take 20 mg by mouth daily.    . potassium chloride (K-DUR) 10 MEQ tablet Take 1 tablet (10 mEq total) by mouth daily. 90 tablet 3  . rosuvastatin (CRESTOR) 40 MG tablet TAKE 1 TABLET AT 6 PM DAILY. 30 tablet 3   No current facility-administered medications for this visit.    Allergies  Allergen Reactions  . Augmentin [Amoxicillin-Pot Clavulanate] Itching   Social History   Social History  . Marital status: Legally Separated     Spouse name: N/A  . Number of children: N/A  . Years of education: N/A   Occupational History  . Truck Geophysicist/field seismologist    Social History Main Topics  . Smoking status: Never Smoker  . Smokeless tobacco: Never Used  . Alcohol use Yes  . Drug use: No  . Sexual activity: Not on file   Other Topics Concern  . Not on file   Social History Narrative  . No narrative on file    Review of Systems: General: negative for chills, fever, night sweats or weight changes.  Cardiovascular: negative for chest pain, dyspnea on exertion, edema, orthopnea, palpitations, paroxysmal nocturnal dyspnea or shortness of breath Dermatological: negative for rash Respiratory: negative for cough or wheezing Urologic: negative for hematuria Abdominal: negative for nausea, vomiting, diarrhea, bright red blood per rectum, melena, or hematemesis Neurologic: negative for visual changes, syncope, or dizziness All other systems reviewed and are otherwise negative except as noted above.  Blood pressure 120/70, pulse (!) 43, height 5\' 11"  (1.803 m), weight (!) 320 lb (145.2 kg).  General appearance: alert, cooperative and no distress Neck: no carotid bruit and no JVD Lungs: clear to auscultation bilaterally Heart: regular rate and rhythm, S1, S2 normal, no murmur, click, rub or gallop Extremities: no LEE Pulses: 2+ and symmetric Skin: warm and dry Neurologic: Grossly normal  Skin bruising on his back and on his right breast.  TTE: 12/24/2015 Left ventricle: The cavity size was normal. There was mild concentric hypertrophy. Systolic function was normal. The estimated ejection fraction was in the range of 60% to 65%. Wall motion was normal; there were no regional wall motion abnormalities. The study is not technically sufficient to allow evaluation of LV diastolic function.  TTE: 03/31/2016  - Procedure narrative: Transthoracic echocardiography. Image  quality was suboptimal. The study was technically difficult.   Intravenous contrast (Definity) was administered. - Left ventricle: The cavity size was normal. Wall thickness was  normal. Systolic function was mildly reduced. The estimated  ejection fraction was in the range of 45% to 50%. Features are  consistent with a pseudonormal left ventricular filling pattern,  with concomitant abnormal relaxation and increased filling  pressure (grade 2 diastolic dysfunction).  Impressions: - The echo is extremely difficult. The views of the LV with  Definity contrast shows that the LV function is likely normal.  Specific wall motin information is not able to be determined by  this echo.  No specific valvular information can be stated on this echo     ASSESSMENT AND PLAN:   1. CAD, s/p CABG x1, LIMA to LAD in January 2017. He is doing great continues to exercise, he had one episode of chest pain would seem to be musculoskeletal. He is encouraged to continue exercising. We will continue using aspirin, rosuvastatin, metoprolol.   2. Paroxsymal Atrial Flutter: s/p recent DCCV 02/26/16. Maintaining NSR. HR is slow, I will decrease Cardizem to 120 mg daily. Patient is fully compliant with Eliquis. Significant bruising,  I will consider discontinuation of liquids if no more episodes until the next visit. The patient is asymptomatic with bradycardia.  3. CAD s/p CABG x1V (12/2015) : As above. Not a candidate for DAPT due to chronic thrombocytopenia.   4. ? Systolic HF: We're unable to tell his LVEF on echocardiogram as his images are so poor, will be inclined to schedule an MRI in the future. He is euvolemic on exam. Continue lasix, daily weights and low sodium diet.   5. OSA - sleep significantly improved on CPM machine.  Plan: follow up in 3 months.    Ena Dawley, MD 08/28/2016

## 2016-08-28 NOTE — Patient Instructions (Signed)
Medication Instructions:   DECREASE YOUR DILTIAZEM TO 120 MG BY MOUTH ONCE DAILY    Follow-Up:  3 MONTHS WITH DR Meda Coffee      If you need a refill on your cardiac medications before your next appointment, please call your pharmacy.

## 2016-11-07 ENCOUNTER — Other Ambulatory Visit: Payer: Self-pay | Admitting: Cardiology

## 2016-11-26 ENCOUNTER — Encounter: Payer: Self-pay | Admitting: Cardiology

## 2016-11-26 ENCOUNTER — Ambulatory Visit (INDEPENDENT_AMBULATORY_CARE_PROVIDER_SITE_OTHER): Payer: BLUE CROSS/BLUE SHIELD | Admitting: Cardiology

## 2016-11-26 VITALS — BP 120/70 | HR 58 | Ht 71.0 in | Wt 329.0 lb

## 2016-11-26 DIAGNOSIS — I2510 Atherosclerotic heart disease of native coronary artery without angina pectoris: Secondary | ICD-10-CM | POA: Diagnosis not present

## 2016-11-26 DIAGNOSIS — E784 Other hyperlipidemia: Secondary | ICD-10-CM | POA: Diagnosis not present

## 2016-11-26 DIAGNOSIS — R001 Bradycardia, unspecified: Secondary | ICD-10-CM

## 2016-11-26 DIAGNOSIS — I4892 Unspecified atrial flutter: Secondary | ICD-10-CM

## 2016-11-26 DIAGNOSIS — I10 Essential (primary) hypertension: Secondary | ICD-10-CM

## 2016-11-26 DIAGNOSIS — E7849 Other hyperlipidemia: Secondary | ICD-10-CM

## 2016-11-26 LAB — COMPREHENSIVE METABOLIC PANEL
ALT: 80 U/L — ABNORMAL HIGH (ref 9–46)
AST: 37 U/L (ref 10–40)
Albumin: 3.7 g/dL (ref 3.6–5.1)
Alkaline Phosphatase: 365 U/L — ABNORMAL HIGH (ref 40–115)
BUN: 26 mg/dL — ABNORMAL HIGH (ref 7–25)
CO2: 29 mmol/L (ref 20–31)
Calcium: 9 mg/dL (ref 8.6–10.3)
Chloride: 104 mmol/L (ref 98–110)
Creat: 1.44 mg/dL — ABNORMAL HIGH (ref 0.60–1.35)
Glucose, Bld: 109 mg/dL — ABNORMAL HIGH (ref 65–99)
Potassium: 4.4 mmol/L (ref 3.5–5.3)
Sodium: 142 mmol/L (ref 135–146)
Total Bilirubin: 0.6 mg/dL (ref 0.2–1.2)
Total Protein: 6.5 g/dL (ref 6.1–8.1)

## 2016-11-26 LAB — LIPID PANEL
Cholesterol: 145 mg/dL (ref <200)
HDL: 53 mg/dL (ref >40)
LDL Cholesterol: 74 mg/dL (ref <100)
Total CHOL/HDL Ratio: 2.7 Ratio (ref <5.0)
Triglycerides: 92 mg/dL (ref <150)
VLDL: 18 mg/dL (ref <30)

## 2016-11-26 LAB — CBC WITH DIFFERENTIAL/PLATELET
Basophils Absolute: 65 cells/uL (ref 0–200)
Basophils Relative: 1 %
Eosinophils Absolute: 325 cells/uL (ref 15–500)
Eosinophils Relative: 5 %
HCT: 42.9 % (ref 38.5–50.0)
Hemoglobin: 13.5 g/dL (ref 13.2–17.1)
Lymphocytes Relative: 32 %
Lymphs Abs: 2080 cells/uL (ref 850–3900)
MCH: 26.9 pg — ABNORMAL LOW (ref 27.0–33.0)
MCHC: 31.5 g/dL — ABNORMAL LOW (ref 32.0–36.0)
MCV: 85.6 fL (ref 80.0–100.0)
MPV: 9.2 fL (ref 7.5–12.5)
Monocytes Absolute: 715 cells/uL (ref 200–950)
Monocytes Relative: 11 %
Neutro Abs: 3315 cells/uL (ref 1500–7800)
Neutrophils Relative %: 51 %
Platelets: 89 10*3/uL — ABNORMAL LOW (ref 140–400)
RBC: 5.01 MIL/uL (ref 4.20–5.80)
RDW: 16.1 % — ABNORMAL HIGH (ref 11.0–15.0)
WBC: 6.5 10*3/uL (ref 3.8–10.8)

## 2016-11-26 LAB — TSH: TSH: 4.54 mIU/L — ABNORMAL HIGH (ref 0.40–4.50)

## 2016-11-26 NOTE — Progress Notes (Signed)
11/26/2016 Sean [name]   09-15-1971  [id number]  Primary Physician [name], MD Primary Cardiologist: Dr. Meda Coffee  Reason for Visit/CC: [facility] F/u for Atrial Flutter  HPI:  Mr. 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   He presented back to [facility] on 02/11/16 with atrial flutter w/ RVR. IV Cardizem initiated for rate control. He was continued on Eliquis. He underwent successful TEE (negative for LA thrombus), followed by DCCV back to NSR. EF was estimated at 35-40%.   He presented back to the [facility] ED on 02/26/16 with recurrent atrial flutter w/ RVR. Since he had been on Eliquis anticoagulation, he underwent successful DC cardioversion with restoration of sinus rhythm. He was seen in ED by Dr. Claiborne Billings who increased his Cardizem CD to 180 mg. He also changed his BB from atenolol to Toprol-XL 25 g daily with plans to further titrate this as blood pressure and heart rate allow.He also advised that he improve compliance with his CPAP and undergo a repeat sleep study.  05/26/2016 - patient is coming after 6 weeks, he is very positive and thankful for everything we have done. He completed cardiac rehabilitation and recently joined a gym where he goes 5 times a week, he has already lost 25 pounds and feels significantly better, more energetic. He denies any chest pain or shortness of breath with exertion. No palpitations since the last visit and no syncope. He continues to wear compression stockings for chronic lower extremity edema. His only concern is itching after medication, not sure which one but suspicious of  potassium pills. It resolves with Pura Spice. He would like to go back to work. No bleeding with Coumadin.  08/28/2016 - 3 months follow-up, he states that he had one episode of chest pain that lasted a few days after heavy work at home, the pain was related to certain position and eventually resolved. He has significant bruising in different parts of his body including breast and back. No blood in urine or stool. He is compliant with his meds tolerating Crestor well. He denies any orthopnea paroxysmal nocturnal dyspnea. No lower extremity edema no palpitations or syncope.  11/26/2016 - 3 months follow up, ocassional CP when hunting deer, lasting few minutes and resolves at rest, no use of sl NTG. No palpitaions or syncope, no LE edema, sleeps with CPAP and has no PND or orthopnea. He has been itching a lot, no improvement even with Benadryl. Easy bruising, no bleeding.   Current Outpatient Prescriptions  Medication Sig Dispense Refill  . allopurinol (ZYLOPRIM) 100 MG tablet Take 1 tablet (100 mg total) by mouth daily. 30 tablet 1  . apixaban (ELIQUIS) 5 MG TABS tablet Take 1 tablet (5 mg total) by mouth 2 (two) times daily. 60 tablet 11  . aspirin 81 MG tablet Take 1 tablet (81 mg total) by mouth daily.    Marland Kitchen diltiazem (CARDIZEM CD) 120 MG 24 hr capsule Take 1 capsule (120 mg total) by mouth daily. 90 capsule 3  . furosemide (LASIX) 40 MG tablet Take 1 tablet (40 mg total) by mouth daily. 30 tablet 3  . metoprolol succinate (TOPROL XL) 25 MG 24 hr tablet Take 1 tablet (25  mg total) by mouth daily. 90 tablet 3  . montelukast (SINGULAIR) 10 MG tablet Take 1 tablet by mouth daily.  2  . nitroGLYCERIN (NITROSTAT) 0.4 MG SL tablet Place 1 tablet (0.4 mg total) under the tongue every 5 (five) minutes as needed for chest pain. 25 tablet 12  . omeprazole (PRILOSEC) 20 MG capsule Take 20 mg by mouth daily.    . potassium chloride (K-DUR) 10 MEQ tablet Take 1 tablet (10 mEq total) by mouth daily. 90 tablet 3    . rosuvastatin (CRESTOR) 40 MG tablet Take 1 tablet (40 mg total) by mouth daily. 30 tablet 11   No current facility-administered medications for this visit.    Allergies  Allergen Reactions  . Augmentin [Amoxicillin-Pot Clavulanate] Itching   Social History   Social History  . Marital status: Legally Separated    Spouse name: N/A  . Number of children: N/A  . Years of education: N/A   Occupational History  . Truck Geophysicist/field seismologist    Social History Main Topics  . Smoking status: Never Smoker  . Smokeless tobacco: Never Used  . Alcohol use Yes  . Drug use: No  . Sexual activity: Not on file   Other Topics Concern  . Not on file   Social History Narrative  . No narrative on file    Review of Systems: General: negative for chills, fever, night sweats or weight changes.  Cardiovascular: negative for chest pain, dyspnea on exertion, edema, orthopnea, palpitations, paroxysmal nocturnal dyspnea or shortness of breath Dermatological: negative for rash Respiratory: negative for cough or wheezing Urologic: negative for hematuria Abdominal: negative for nausea, vomiting, diarrhea, bright red blood per rectum, melena, or hematemesis Neurologic: negative for visual changes, syncope, or dizziness All other systems reviewed and are otherwise negative except as noted above.  Blood pressure 120/70, pulse (!) 58, height 5\' 11"  (1.803 m), weight (!) 329 lb (149.2 kg).  General appearance: alert, cooperative and no distress Neck: no carotid bruit and no JVD Lungs: clear to auscultation bilaterally Heart: regular rate and rhythm, S1, S2 normal, no murmur, click, rub or gallop Extremities: no LEE Pulses: 2+ and symmetric Skin: warm and dry Neurologic: Grossly normal  Skin bruising on his back and on his right breast.  TTE: 12/24/2015 Left ventricle: The cavity size was normal. There was mild concentric hypertrophy. Systolic function was normal. The estimated ejection fraction was in the  range of 60% to 65%. Wall motion was normal; there were no regional wall motion abnormalities. The study is not technically sufficient to allow evaluation of LV diastolic function.  TTE: 03/31/2016  - Procedure narrative: Transthoracic echocardiography. Image  quality was suboptimal. The study was technically difficult.  Intravenous contrast (Definity) was administered. - Left ventricle: The cavity size was normal. Wall thickness was  normal. Systolic function was mildly reduced. The estimated  ejection fraction was in the range of 45% to 50%. Features are  consistent with a pseudonormal left ventricular filling pattern,  with concomitant abnormal relaxation and increased filling  pressure (grade 2 diastolic dysfunction).  Impressions: - The echo is extremely difficult. The views of the LV with  Definity contrast shows that the LV function is likely normal.  Specific wall motin information is not able to be determined by  this echo.  No specific valvular information can be stated on this echo     ASSESSMENT AND PLAN:   1. CAD, s/p CABG x1, LIMA to LAD in January 2017. He is doing great continues  to exercise, He has stable minimal angina. He is encouraged to continue exercising. We will continue using aspirin, rosuvastatin, metoprolol.  No ischemic workup needed.  2. Paroxsymal Atrial Flutter: s/p recent DCCV 02/26/16. Maintaining NSR. HR is slow,continue Cardizem to 120 mg daily. Patient is fully compliant with Eliquis. Significant bruising, he feels that itching sec to Eliquis, we will hold for 2 weeks, he only had 2 episodes of a-flutter and felt it he will call us if he goes back to a-flutter. Follow up in 2 weeks with a pharmacist, if no improvement we will restart Eliquis.  3. CAD s/p CABG x1V (12/2015) : As above. Not a candidate for DAPT due to chronic thrombocytopenia.   4. ? Systolic HF: We're unable to tell his LVEF on echocardiogram as his images are so poor,  will be inclined to schedule an MRI in the future. He is euvolemic on exam. Continue lasix, daily weights and low sodium diet.   5. OSA - sleep significantly improved on CPM machine.  Plan: follow up in 2 weeks, in 6 months with me, check CBC, lipids, TSH and CMP today.    Ena Dawley, MD 11/26/2016

## 2016-11-26 NOTE — Patient Instructions (Signed)
Medication Instructions:   HOLD YOUR ELIQUIS FOR 2 WEEKS AND THEN COME BACK INTO THE OFFICE TO SEE OUR PHARMACIST TO RE-EVALUATE THIS   Labwork:  TODAY--CMET, CBC W DIFF, TSH, AND FASTING LIPIDS    Follow-Up:  Your physician wants you to follow-up in: [place] will receive a reminder letter in the mail two months in advance. If you don't receive a letter, please call our office to schedule the follow-up appointment.    SCHEDULE AN APPOINTMENT WITH [facility] IN 2 WEEKS PER DR NELSON--FOR HELD ELIQUIS X 2 WEEKS AND TO RE-EVAL THIS      If you need a refill on your cardiac medications before your next appointment, please call your pharmacy.

## 2016-11-27 ENCOUNTER — Telehealth: Payer: Self-pay | Admitting: *Deleted

## 2016-11-27 DIAGNOSIS — R7989 Other specified abnormal findings of blood chemistry: Secondary | ICD-10-CM | POA: Insufficient documentation

## 2016-11-27 DIAGNOSIS — R799 Abnormal finding of blood chemistry, unspecified: Secondary | ICD-10-CM | POA: Insufficient documentation

## 2016-11-27 MED ORDER — FUROSEMIDE 20 MG PO TABS: 20.0000 mg | ORAL_TABLET | Freq: Every day | ORAL | 3 refills | Status: DC

## 2016-11-27 MED ORDER — ROSUVASTATIN CALCIUM 20 MG PO TABS
20.0000 mg | ORAL_TABLET | Freq: Every day | ORAL | 3 refills | Status: DC
Start: 2016-11-27 — End: 2017-03-17

## 2016-11-27 NOTE — Telephone Encounter (Signed)
Notified the pt that per Dr Meda Coffee, his labs showed that his lipids were normal and she recommends that we cut his Crestor to 20 mg po daily.  Informed the pt that per Dr Meda Coffee, she recommends that we cut his lasix to 20 mg po daily, due to elevated BUN and Creatinine levels.  Informed the pt that per Dr Meda Coffee, his TSH was borderline, so she recommends that we bring him back to repeat labs in 4- 6 weeks to check a CMET, TSH, Free T3 and Free T4.  Confirmed the pharmacy of choice with the pt.  Scheduled the pt a lab appt to check cmet, tsh , free T3 & T4 for 12/29/16 at our office.  Pt requested this date, for commute reasons, and being in the area on that day.  Pt verbalized understanding and agrees with this plan.

## 2016-11-27 NOTE — Telephone Encounter (Signed)
-----   Message from Dorothy Spark, MD sent at 11/26/2016  8:59 PM EST ----- Please tel hm to cut lasix to 20 mg po daily, Also he has normal lipids, cut his crestor to 20 mg po daiy,  Borderline TSH, I would schedule repeat CMP, TSH, fT3 and fT4 in 4-6 weeks.

## 2016-11-28 DIAGNOSIS — D696 Thrombocytopenia, unspecified: Secondary | ICD-10-CM | POA: Diagnosis not present

## 2016-12-09 ENCOUNTER — Ambulatory Visit (INDEPENDENT_AMBULATORY_CARE_PROVIDER_SITE_OTHER): Payer: BLUE CROSS/BLUE SHIELD | Admitting: Pharmacist

## 2016-12-09 ENCOUNTER — Ambulatory Visit: Payer: BLUE CROSS/BLUE SHIELD

## 2016-12-09 ENCOUNTER — Encounter: Payer: Self-pay | Admitting: Pharmacist

## 2016-12-09 VITALS — Wt 337.5 lb

## 2016-12-09 DIAGNOSIS — I4892 Unspecified atrial flutter: Secondary | ICD-10-CM | POA: Diagnosis not present

## 2016-12-09 MED ORDER — APIXABAN 5 MG PO TABS: 5.0000 mg | ORAL_TABLET | Freq: Two times a day (BID) | ORAL | 5 refills | Status: DC

## 2016-12-09 NOTE — Patient Instructions (Signed)
Restart Eliquis 5mg  twice daily. Call the clinic if the itching gets worse. See primary care doctor about itching.

## 2016-12-09 NOTE — Progress Notes (Signed)
Patient ID: [name]                 DOB: [birth year]                      MRN: [id number]     HPI: Sean [name] is a 46 y.o. male patient of Dr. Meda Coffee with PMH below who presents today for anticoagulation follow up. He was recently seen by Dr. Meda Coffee and reported significant bruising and itching for which he associated with Eliquis.   He presents today stating that he is still itching. He reports that itching has actually intensified. He feels that the itching is not actually related to Eliquis. He states bruising is slightly better, but he still bruises rather easily.    Cardiac Hx: HTN, sinus Bradycardia, angina, CAD, Aflutter, OSA, CKD, HLD, thrombocytopenia, CABGx1  Wt Readings from Last 3 Encounters:  11/26/16 (!) 329 lb (149.2 kg)  08/28/16 (!) 320 lb (145.2 kg)  05/26/16 (!) 320 lb (145.2 kg)   BP Readings from Last 3 Encounters:  11/26/16 120/70  08/28/16 120/70  05/26/16 124/70   Pulse Readings from Last 3 Encounters:  11/26/16 (!) 58  08/28/16 (!) 43  05/26/16 (!) 56    Renal function: Estimated Creatinine Clearance: 98.1 mL/min (by C-G formula based on SCr of 1.44 mg/dL (H)).  Past Medical History:  Diagnosis Date  . Abnormal liver enzymes    a. Sees a doctor in [place].  . Atrial flutter with rapid ventricular response ([place]) 02/12/2016  . Coronary artery disease   . Hematuria    a. Chronic hx of this, no prior etiology determined through workup per patient.  . Hypercholesteremia    a. Prev taken off statin due to abnormal liver function.  . Hypertension   . Morbid obesity ([place])   . 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 Sinus bradycardia   . Thrombocytopenia ([place])     Current Outpatient Prescriptions on File Prior to Visit  Medication Sig Dispense Refill  . allopurinol (ZYLOPRIM) 100 MG tablet Take 1 tablet (100 mg total) by mouth daily. 30 tablet 1  . apixaban (ELIQUIS) 5 MG TABS tablet Take 1 tablet (5 mg total) by mouth 2 (two)  times daily. 60 tablet 11  . aspirin 81 MG tablet Take 1 tablet (81 mg total) by mouth daily.    Marland Kitchen diltiazem (CARDIZEM CD) 120 MG 24 hr capsule Take 1 capsule (120 mg total) by mouth daily. 90 capsule 3  . furosemide (LASIX) 20 MG tablet Take 1 tablet (20 mg total) by mouth daily. 30 tablet 3  . metoprolol succinate (TOPROL XL) 25 MG 24 hr tablet Take 1 tablet (25 mg total) by mouth daily. 90 tablet 3  . montelukast (SINGULAIR) 10 MG tablet Take 1 tablet by mouth daily.  2  . nitroGLYCERIN (NITROSTAT) 0.4 MG SL tablet Place 1 tablet (0.4 mg total) under the tongue every 5 (five) minutes as needed for chest pain. 25 tablet 12  . omeprazole (PRILOSEC) 20 MG capsule Take 20 mg by mouth daily.    . potassium chloride (K-DUR) 10 MEQ tablet Take 1 tablet (10 mEq total) by mouth daily. 90 tablet 3  . rosuvastatin (CRESTOR) 20 MG tablet Take 1 tablet (20 mg total) by mouth daily. 30 tablet 3   No current facility-administered medications on file prior to visit.     Allergies  Allergen Reactions  . Augmentin [Amoxicillin-Pot  Clavulanate] Itching    There were no vitals taken for this visit.   Assessment/Plan: Anticoagulation: Patient would prefer to remain on Eliquis as he had a supply of this still at home. Resume Eliquis 5mg  BID. He will follow up with PCP to address itching issues. Follow up with Dr. Meda Coffee as instructed.    Thank you, Lelan Pons. Patterson Hammersmith, [facility]  12/09/2016 10:17 AM

## 2016-12-12 ENCOUNTER — Ambulatory Visit: Payer: BLUE CROSS/BLUE SHIELD

## 2016-12-22 ENCOUNTER — Ambulatory Visit: Payer: BLUE CROSS/BLUE SHIELD | Admitting: Thoracic Surgery (Cardiothoracic Vascular Surgery)

## 2016-12-29 ENCOUNTER — Other Ambulatory Visit: Payer: BLUE CROSS/BLUE SHIELD | Admitting: *Deleted

## 2016-12-29 ENCOUNTER — Telehealth: Payer: Self-pay | Admitting: Cardiology

## 2016-12-29 ENCOUNTER — Ambulatory Visit: Payer: BLUE CROSS/BLUE SHIELD | Admitting: Thoracic Surgery (Cardiothoracic Vascular Surgery)

## 2016-12-29 DIAGNOSIS — E7800 Pure hypercholesterolemia, unspecified: Secondary | ICD-10-CM

## 2016-12-29 DIAGNOSIS — R001 Bradycardia, unspecified: Secondary | ICD-10-CM

## 2016-12-29 DIAGNOSIS — I4892 Unspecified atrial flutter: Secondary | ICD-10-CM

## 2016-12-29 DIAGNOSIS — I483 Typical atrial flutter: Secondary | ICD-10-CM

## 2016-12-29 DIAGNOSIS — I10 Essential (primary) hypertension: Secondary | ICD-10-CM

## 2016-12-29 DIAGNOSIS — I200 Unstable angina: Secondary | ICD-10-CM

## 2016-12-29 DIAGNOSIS — I2510 Atherosclerotic heart disease of native coronary artery without angina pectoris: Secondary | ICD-10-CM

## 2016-12-29 NOTE — Addendum Note (Signed)
Addended by: Eulis Foster on: 12/29/2016 09:25 AM   Modules accepted: Orders

## 2016-12-29 NOTE — Telephone Encounter (Signed)
Patient was in office today for repeat labs ordered by MD. He states he lasix was decreased from 40mg  QD to 20mg  QD and he is still have bilateral LE edema + hand swelling - he has no shortness of breath. Advised patient that med was decreased d/t elevated BUN/creatinine and the labs he had today were to recheck those values  Informed him a message will be sen to MD w/concerns and he will be notified what to do with his medications once the labs have resulted.   He agreed w/plan + voiced understanding.

## 2016-12-30 LAB — COMPREHENSIVE METABOLIC PANEL
ALT: 83 IU/L — ABNORMAL HIGH (ref 0–44)
AST: 47 IU/L — ABNORMAL HIGH (ref 0–40)
Albumin/Globulin Ratio: 1.3 (ref 1.2–2.2)
Albumin: 3.8 g/dL (ref 3.5–5.5)
Alkaline Phosphatase: 378 IU/L — ABNORMAL HIGH (ref 39–117)
BUN/Creatinine Ratio: 14 (ref 9–20)
BUN: 20 mg/dL (ref 6–24)
Bilirubin Total: 0.6 mg/dL (ref 0.0–1.2)
CO2: 22 mmol/L (ref 18–29)
Calcium: 9 mg/dL (ref 8.7–10.2)
Chloride: 103 mmol/L (ref 96–106)
Creatinine, Ser: 1.44 mg/dL — ABNORMAL HIGH (ref 0.76–1.27)
GFR calc Af Amer: 68 mL/min/{1.73_m2} (ref >59)
GFR calc non Af Amer: 59 mL/min/{1.73_m2} — ABNORMAL LOW (ref >59)
Globulin, Total: 2.9 g/dL (ref 1.5–4.5)
Glucose: 119 mg/dL — ABNORMAL HIGH (ref 65–99)
Potassium: 4.2 mmol/L (ref 3.5–5.2)
Sodium: 145 mmol/L — ABNORMAL HIGH (ref 134–144)
Total Protein: 6.7 g/dL (ref 6.0–8.5)

## 2016-12-30 LAB — T3, FREE: T3, Free: 3.4 pg/mL (ref 2.0–4.4)

## 2016-12-30 LAB — TSH: TSH: 2.41 u[IU]/mL (ref 0.450–4.500)

## 2016-12-30 LAB — T4, FREE: Free T4: 1.14 ng/dL (ref 0.82–1.77)

## 2016-12-30 MED ORDER — FUROSEMIDE 40 MG PO TABS: 40.0000 mg | ORAL_TABLET | Freq: Every day | ORAL | 3 refills | Status: DC

## 2016-12-30 NOTE — Telephone Encounter (Addendum)
Review and advise on pts complaints mentioned yesterday to triage RN.  Pt would like to go back on Lasix 40 mg po daily, for he states that this regimen worked better for his issue with swelling.  Please advise.

## 2016-12-30 NOTE — Telephone Encounter (Signed)
Notified the pt that per Dr Meda Coffee, it is ok to increase his lasix to 40 mg po daily.  Pt requested a 90 day supply to be sent to his confirmed pharmacy of choice.  Pt verbalized understanding and agrees with this plan.  Pt gracious for all the assistance provided.

## 2016-12-30 NOTE — Addendum Note (Signed)
Addended by: Nuala Alpha on: 12/30/2016 09:32 AM   Modules accepted: Orders

## 2016-12-30 NOTE — Telephone Encounter (Signed)
That's ok with me.

## 2017-01-05 ENCOUNTER — Encounter: Payer: Self-pay | Admitting: Thoracic Surgery (Cardiothoracic Vascular Surgery)

## 2017-01-05 ENCOUNTER — Ambulatory Visit (INDEPENDENT_AMBULATORY_CARE_PROVIDER_SITE_OTHER): Payer: BLUE CROSS/BLUE SHIELD | Admitting: Thoracic Surgery (Cardiothoracic Vascular Surgery)

## 2017-01-05 ENCOUNTER — Ambulatory Visit: Payer: BLUE CROSS/BLUE SHIELD | Admitting: Thoracic Surgery (Cardiothoracic Vascular Surgery)

## 2017-01-05 VITALS — BP 120/79 | HR 60 | Resp 20 | Ht 71.0 in | Wt 334.0 lb

## 2017-01-05 DIAGNOSIS — Z951 Presence of aortocoronary bypass graft: Secondary | ICD-10-CM

## 2017-01-05 NOTE — Progress Notes (Signed)
[address]       [place] [address]             [id number]     CARDIOTHORACIC SURGERY OFFICE NOTE  Referring Provider is Bensimhon, Shaune Pascal, MD  Primary Cardiologist is Ena Dawley, MD PCP is [name], MD   HPI:  Patient is a 46 year old morbidly obese white male who returns to the office today for routine follow-up approximately one year status post off-pump coronary artery bypass grafting 1 on 12/26/2015.  His early postoperative recovery was uneventful although he did develop recurrent persistent atrial flutter requiring DC cardioversion. He was last seen here in our office on 03/24/2016 at which time he was doing well.  Since then he has been followed carefully by Dr. Meda Coffee who saw him most recently 11/26/2016.  He reports that he is doing very well. He admits that he has not been successful in losing any weight, and fact he may have actually gained some weight over the winter months. He states that he had been exercising regularly and he participated in the cardiac rehabilitation program, but he stopped exercising 1 deer season began in the fall.  He overall reports that he feels quite well. He notes that he feels much improved in comparison with how he felt prior to his bypass surgery last year. He states that he only gets short of breath with strenuous physical exertion, and this does not limit his activity at all. He is back at work and not having any significant problems. He states that he occasionally has some mild tightness across his chest, but this again occurs only with very strenuous exertion or heavy lifting. Overall he feels well andthat he feels much better than he did prior to his surgery. The remainder of his review of systems unremarkable.   Current Outpatient Prescriptions  Medication Sig Dispense Refill  . allopurinol (ZYLOPRIM) 100 MG tablet Take 1 tablet (100 mg total) by mouth daily. 30 tablet 1  . apixaban (ELIQUIS) 5 MG TABS  tablet Take 1 tablet (5 mg total) by mouth 2 (two) times daily. 60 tablet 5  . aspirin 81 MG tablet Take 1 tablet (81 mg total) by mouth daily.    . furosemide (LASIX) 40 MG tablet Take 1 tablet (40 mg total) by mouth daily. 90 tablet 3  . metoprolol succinate (TOPROL XL) 25 MG 24 hr tablet Take 1 tablet (25 mg total) by mouth daily. 90 tablet 3  . nitroGLYCERIN (NITROSTAT) 0.4 MG SL tablet Place 1 tablet (0.4 mg total) under the tongue every 5 (five) minutes as needed for chest pain. 25 tablet 12  . omeprazole (PRILOSEC) 20 MG capsule Take 20 mg by mouth daily.    . potassium chloride (K-DUR) 10 MEQ tablet Take 1 tablet (10 mEq total) by mouth daily. 90 tablet 3  . rosuvastatin (CRESTOR) 20 MG tablet Take 1 tablet (20 mg total) by mouth daily. 30 tablet 3   No current facility-administered medications for this visit.       Physical Exam:   BP 120/79   Pulse 60   Resp 20   Ht 5\' 11"  (1.803 m)   Wt (!) 334 lb (151.5 kg)   SpO2 97% Comment: RA  BMI 46.58 kg/m   General:  Morbidly obese but well appearing  Chest:   Clear to auscultation  CV:   Regular rate and rhythm without murmur  Incisions:  Completely healed, sternum is stable  Abdomen:  Soft and nontender  Extremities:  Warm and well-perfused  Diagnostic Tests:  n/a   Impression:  Patient is doing well approximately one year status post coronary artery bypass grafting.   Plan:  We have not recommended any changes to the patient's current medications. I had a long discussion with the patient about his long-term prognosis and the need for him to address his underlying chronic medical problems, most notably his morbid obesity. We discussed many benefits of regular exercise, heart healthy diet, and weight loss. All of his questions been addressed. In the future he will call and return to see [place] only should specific problems or questions arise.  I spent in excess of 15 minutes during the conduct of this office consultation  and >50% of this time involved direct face-to-face encounter with the patient for counseling and/or coordination of their care.    Valentina Gu. Roxy Manns, MD 01/05/2017 4:27 PM

## 2017-01-05 NOTE — Patient Instructions (Signed)
Continue all previous medications without any changes at this time  Make every effort to stay physically active, get some type of exercise on a regular basis, and stick to a "heart healthy diet".  The long term benefits for regular exercise and a healthy diet are critically important to your overall health and wellbeing.   You have been advised of the numerous benefits associated with regular exercise and weight loss.  The long term benefits for your overall health and wellbeing cannot be overestimated.

## 2017-01-09 ENCOUNTER — Ambulatory Visit: Payer: BLUE CROSS/BLUE SHIELD | Admitting: Thoracic Surgery (Cardiothoracic Vascular Surgery)

## 2017-01-20 DIAGNOSIS — R7303 Prediabetes: Secondary | ICD-10-CM | POA: Insufficient documentation

## 2017-01-20 DIAGNOSIS — M1A09X0 Idiopathic chronic gout, multiple sites, without tophus (tophi): Secondary | ICD-10-CM | POA: Insufficient documentation

## 2017-01-23 DIAGNOSIS — K7581 Nonalcoholic steatohepatitis (NASH): Secondary | ICD-10-CM | POA: Insufficient documentation

## 2017-01-23 DIAGNOSIS — R748 Abnormal levels of other serum enzymes: Secondary | ICD-10-CM | POA: Insufficient documentation

## 2017-02-14 ENCOUNTER — Other Ambulatory Visit: Payer: Self-pay | Admitting: Physician Assistant

## 2017-02-16 NOTE — Telephone Encounter (Signed)
Will you please advise for refill, thanks!

## 2017-03-17 ENCOUNTER — Other Ambulatory Visit: Payer: Self-pay | Admitting: Cardiology

## 2017-03-17 DIAGNOSIS — R799 Abnormal finding of blood chemistry, unspecified: Secondary | ICD-10-CM

## 2017-03-17 DIAGNOSIS — R7989 Other specified abnormal findings of blood chemistry: Secondary | ICD-10-CM

## 2017-04-06 ENCOUNTER — Telehealth: Payer: Self-pay | Admitting: Cardiology

## 2017-04-06 NOTE — Telephone Encounter (Signed)
Patient mother calling, states that her son Buda) complained of chest tightness. The chest tightness has been going on for about a week. Patient mother states that patient did not mention any other symptoms. Please call to discuss, thanks.

## 2017-04-06 NOTE — Telephone Encounter (Signed)
Pt calling to report he has been having intermittent chest tightness for about a week now.  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  Pt states when he feels his tightness and exerts, he gets mildly sob, but no sob at rest.  Pt states he is taking all his meds prescribed with no skipped doses.  Pt states he has not utilized Nitroglycerin, but does have this on hand if needed.  Pt requesting a follow-up appt with Dr Meda Coffee or her PA tomorrow after lunch, for he will be in town then. Pt states he will have to go back out of town on Wednesday morning and will be out the rest of the week.  Scheduled the pt a follow-up appt with Melina Copa PA-C on tomorrow 5/8 at 2 pm, for complaints mentioned.  Advised the pt to arrive 20 mins early to this appt.  Educated the pt on how and when to use his Nitro.  Advised the pt to make sure with long ride times that he gets out and walks every hour to promote good circulation. Advised the pt if his symptoms worsen, then he should report to the closest ER.  Pt verbalized understanding and agrees with this plan.  Will route this message to Dr Meda Coffee as an Juluis Rainier.

## 2017-04-07 ENCOUNTER — Other Ambulatory Visit: Payer: Self-pay | Admitting: Physician Assistant

## 2017-04-07 ENCOUNTER — Ambulatory Visit (INDEPENDENT_AMBULATORY_CARE_PROVIDER_SITE_OTHER): Payer: BLUE CROSS/BLUE SHIELD | Admitting: Physician Assistant

## 2017-04-07 ENCOUNTER — Encounter: Payer: Self-pay | Admitting: Physician Assistant

## 2017-04-07 VITALS — BP 118/78 | HR 83 | Ht 71.0 in | Wt 335.2 lb

## 2017-04-07 DIAGNOSIS — I4892 Unspecified atrial flutter: Secondary | ICD-10-CM | POA: Diagnosis not present

## 2017-04-07 DIAGNOSIS — I5042 Chronic combined systolic (congestive) and diastolic (congestive) heart failure: Secondary | ICD-10-CM

## 2017-04-07 DIAGNOSIS — R072 Precordial pain: Secondary | ICD-10-CM | POA: Diagnosis not present

## 2017-04-07 DIAGNOSIS — I2510 Atherosclerotic heart disease of native coronary artery without angina pectoris: Secondary | ICD-10-CM

## 2017-04-07 LAB — CBC
HEMATOCRIT: 43 % (ref 37.5–51.0)
HEMOGLOBIN: 14.8 g/dL (ref 13.0–17.7)
MCH: 28.9 pg (ref 26.6–33.0)
MCHC: 34.4 g/dL (ref 31.5–35.7)
MCV: 84 fL (ref 79–97)
Platelets: 83 10*3/uL — CL (ref 150–379)
RBC: 5.12 x10E6/uL (ref 4.14–5.80)
RDW: 16.3 % — ABNORMAL HIGH (ref 12.3–15.4)
WBC: 7.3 10*3/uL (ref 3.4–10.8)

## 2017-04-07 LAB — BASIC METABOLIC PANEL
BUN / CREAT RATIO: 13 (ref 9–20)
BUN: 19 mg/dL (ref 6–24)
CALCIUM: 9.7 mg/dL (ref 8.7–10.2)
CHLORIDE: 103 mmol/L (ref 96–106)
CO2: 29 mmol/L (ref 18–29)
CREATININE: 1.49 mg/dL — AB (ref 0.76–1.27)
GFR calc Af Amer: 65 mL/min/{1.73_m2} (ref >59)
GFR calc non Af Amer: 56 mL/min/{1.73_m2} — ABNORMAL LOW (ref >59)
GLUCOSE: 115 mg/dL — AB (ref 65–99)
Potassium: 4.4 mmol/L (ref 3.5–5.2)
Sodium: 139 mmol/L (ref 134–144)

## 2017-04-07 NOTE — Addendum Note (Signed)
Addended by: Gaetano Net on: 04/07/2017 03:05 PM   Modules accepted: Orders

## 2017-04-07 NOTE — Patient Instructions (Addendum)
Medication Instructions:  Your physician recommends that you continue on your current medications as directed. Please refer to the Current Medication list given to you today.   Labwork: STAT:  TROPONIN, BMET, CBC, & TSH  Testing/Procedures: Your physician has requested that you have a lexiscan myoview ASAP. For further information please visit [email or web address]. Please follow instruction sheet, as given.    Follow-Up: Your physician recommends that you schedule a follow-up appointment in: WITH DAYNA DUNN, 1-2 DAYS AFTER STRESS TEST IS DONE   Any Other Special Instructions Will Be Listed Below (If Applicable).   Pharmacologic Stress Electrocardiogram Introduction A pharmacologic stress electrocardiogram is a heart (cardiac) test that uses nuclear imaging to evaluate the blood supply to your heart. This test may also be called a pharmacologic stress electrocardiography. Pharmacologic means that a medicine is used to increase your heart rate and blood pressure. This stress test is done to find areas of poor blood flow to the heart by determining the extent of coronary artery disease (CAD). Some people exercise on a treadmill, which naturally increases the blood flow to the heart. For those people unable to exercise on a treadmill, a medicine is used. This medicine stimulates your heart and will cause your heart to beat harder and more quickly, as if you were exercising. Pharmacologic stress tests can help determine:  The adequacy of blood flow to your heart during increased levels of activity in order to clear you for discharge home.  The extent of coronary artery blockage caused by CAD.  Your prognosis if you have suffered a heart attack.  The effectiveness of cardiac procedures done, such as an angioplasty, which can increase the circulation in your coronary arteries.  Causes of chest pain or pressure. LET [facility] CARE PROVIDER KNOW ABOUT:  Any allergies you have.  All  medicines you are taking, including vitamins, herbs, eye drops, creams, and over-the-counter medicines.  Previous problems you or members of your family have had with the use of anesthetics.  Any blood disorders you have.  Previous surgeries you have had.  Medical conditions you have.  Possibility of pregnancy, if this applies.  If you are currently breastfeeding. RISKS AND COMPLICATIONS Generally, this is a safe procedure. However, as with any procedure, complications can occur. Possible complications include:  You develop pain or pressure in the following areas:  Chest.  Jaw or neck.  Between your shoulder blades.  Radiating down your left arm.  Headache.  Dizziness or light-headedness.  Shortness of breath.  Increased or irregular heartbeat.  Low blood pressure.  Nausea or vomiting.  Flushing.  Redness going up the arm and slight pain during injection of medicine.  Heart attack (rare). BEFORE THE PROCEDURE  Avoid all forms of caffeine for 24 hours before your test or as directed by your health care provider. This includes coffee, tea (even decaffeinated tea), caffeinated sodas, chocolate, cocoa, and certain pain medicines.  Follow your health care provider's instructions regarding eating and drinking before the test.  Take your medicines as directed at regular times with water unless instructed otherwise. Exceptions may include:  If you have diabetes, ask how you are to take your insulin or pills. It is common to adjust insulin dosing the morning of the test.  If you are taking beta-blocker medicines, it is important to talk to your health care provider about these medicines well before the date of your test. Taking beta-blocker medicines may interfere with the test. In some cases, these medicines need to be changed  or stopped 24 hours or more before the test.  If you wear a nitroglycerin patch, it may need to be removed prior to the test. Ask your health  care provider if the patch should be removed before the test.  If you use an inhaler for any breathing condition, bring it with you to the test.  If you are an outpatient, bring a snack so you can eat right after the stress phase of the test.  Do not smoke for 4 hours prior to the test or as directed by your health care provider.  Do not apply lotions, powders, creams, or oils on your chest prior to the test.  Wear comfortable shoes and clothing. Let your health care provider know if you were unable to complete or follow the preparations for your test. PROCEDURE  Multiple patches (electrodes) will be put on your chest. If needed, small areas of your chest may be shaved to get better contact with the electrodes. Once the electrodes are attached to your body, multiple wires will be attached to the electrodes, and your heart rate will be monitored.  An IV access will be started. A nuclear trace (isotope) is given. The isotope may be given intravenously, or it may be swallowed. Nuclear refers to several types of radioactive isotopes, and the nuclear isotope lights up the arteries so that the nuclear images are clear. The isotope is absorbed by your body. This results in low radiation exposure.  A resting nuclear image is taken to show how your heart functions at rest.  A medicine is given through the IV access.  A second scan is done about 1 hour after the medicine injection and determines how your heart functions under stress.  During this stress phase, you will be connected to an electrocardiogram machine. Your blood pressure and oxygen levels will be monitored. What to expect after the procedure  Your heart rate and blood pressure will be monitored after the test.  You may return to your normal schedule, including diet,activities, and medicines, unless your health care provider tells you otherwise. This information is not intended to replace advice given to you by your health care  provider. Make sure you discuss any questions you have with your health care provider. Document Released: 04/05/2009 Document Revised: 04/24/2016 Document Reviewed: 05/26/2016 Elsevier Interactive Patient Education  2017 [employer].   If you need a refill on your cardiac medications before your next appointment, please call your pharmacy.

## 2017-04-07 NOTE — Progress Notes (Signed)
Cardiology Office Note    Date:  04/07/2017  ID:  Sean [name], DOB 1971-11-06, MRN [id number] PCP:  Raina Mina., MD  Cardiologist:  Sean [name]   Chief Complaint: chest pain  History of Present Illness:  Sean [name] is a 46 y.o. 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 In 12/2015 he presented to [facility] with chest pain and was found to have severe 1V CAD with high grade ostial LAD lesion. With low platelets and elevated LFTs, it was suspected that he may have cardiac cirrhosis due to persistently elevated R-sighted pressures. EF was 60-65% at that time. Although LAD lesion may have been approachable percutaneously, with low platelets, long-term DAPT was not felt to be best option. He underwent 1V CABG with LIMA-LAD. He was seen in the ER 02/12/16 with atrial fib/flutter RVR - he did not initially tolerate cardizem drip due to hypotension, occurred in setting of nasal congestion use. Underwent TEE/DCCV 02/14/16 with TEE showing no PFO (aneurysmal atrial septum), mild MR, LVEF 35-40%, small mostly posterior pericardial effusion, successful DCCV to NSR. He subsequent had recurrent atrial flutter 02/26/16. Since he had been on Eliquis anticoagulation, he underwent successful DC cardioversion with restoration of sinus rhythm. He was seen in ED by Dr. Claiborne Billings who increased his Cardizem CD to 180 mg. He also changed his BB from atenolol to Toprol-XL 25 g daily with plans to further titrate this as blood pressure and heart rate allow. He also advised that he improve compliance with his CPAP and undergo a  repeat sleep study. Subsequent echo 03/2016 showed EF 45-50%, grade 2 DD but Dr. Meda [name] felt images have been so poor that we are unable to tell his LVEF. Last labs Jan 2018 showed Cr 1.44, K 4.2, BUN 20, glucose 119, AST 47, ALT 83, alk phos 378, LDL 74, HDL 53, Hgb 13.5, platelets 89, TSH wnl - Dr. Meda [name] cut his Crestor to 26m daily.   He returns for follow-up with his dad today. For the past 2-3 weeks he's noticed essentially a constant band-like discomfort that wraps around under his ribs (upper epigastrum/lower chest). This pain is not worse with anything in particular and seems to improve when he lies down and stretches out. There is a second type of chest discomfort he's been noticing characterized by tightness in his chest that occurs mostly with exertion. This does not happen every time he exerts himself. It improves with resting. It does not feel like prior angina. He has chronic DOE and LEE which he feels is unchanged. LEE is controlled with compression hose (states they swell tremendously if he does not wear them). He denies any radiation to arm or jaw like prior angina. He has not had dizziness or syncope.   Past Medical History:  Diagnosis Date  . Abnormal liver enzymes    a. [place]  . Cardiac cirrhosis    a. possible elevated LFTs/low platelets felt due to cardiac cirrhosis per 2017 admission.  . Chronic combined systolic and diastolic CHF (congestive heart failure) ([place]   . CKD (chronic kidney disease), stage II   . 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 Coronary artery disease    a. s/p CABGx1 in 12/2015.  Marland Kitchen Hematuria    a. Chronic hx of this, no prior etiology determined through workup per patient.  . Hypercholesteremia    a. Prev taken off statin due to abnormal liver function.  . Hypertension   . Morbid obesity ([place])   . Paroxysmal atrial flutter ([place]) 02/12/2016  . 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   LIMA to  LAD  . Sinus bradycardia   . Thrombocytopenia ([place])     Past Surgical History:  Procedure Laterality Date  . APPENDECTOMY    . CARDIAC CATHETERIZATION N/A 12/24/2015   Procedure: Right/Left Heart Cath and Coronary Angiography;  Surgeon: Jolaine Artist, MD;  Location: [place] [facility] LAB;  Service: Cardiovascular;  Laterality: N/A;  . CARDIOVERSION N/A 02/14/2016   Procedure: CARDIOVERSION;  Surgeon: Pixie Casino, MD;  Location: [facility] ENDOSCOPY;  Service: Cardiovascular;  Laterality: N/A;  . CORONARY ARTERY BYPASS GRAFT N/A 12/26/2015   Procedure: Off Pump Coronary Artery Bypass Grafting times one using left internal mammary artery;  Surgeon: Rexene Alberts, MD;  Location: [place];  Service: Open Heart Surgery;  Laterality: N/A;  . GALLBLADDER SURGERY    . HERNIA REPAIR    . TEE WITHOUT CARDIOVERSION N/A 12/26/2015   Procedure: TRANSESOPHAGEAL ECHOCARDIOGRAM (TEE);  Surgeon: Rexene Alberts, MD;  Location: [place];  Service: Open Heart Surgery;  Laterality: N/A;  . TEE WITHOUT CARDIOVERSION N/A 02/14/2016   Procedure: TRANSESOPHAGEAL ECHOCARDIOGRAM (TEE);  Surgeon: Pixie Casino, MD;  Location: [facility] ENDOSCOPY;  Service: Cardiovascular;  Laterality: N/A;    Current Medications: Current Outpatient Prescriptions  Medication Sig Dispense Refill  . allopurinol (ZYLOPRIM) 100 MG tablet Take 1 tablet (100 mg total) by mouth daily. 30 tablet 1  . apixaban (ELIQUIS) 5 MG TABS tablet Take 1 tablet (5 mg total) by mouth 2 (two) times daily. 60 tablet 5  . aspirin 81 MG tablet Take 1 tablet (81 mg total) by mouth daily.    Marland Kitchen diltiazem (CARDIZEM) 120 MG tablet Take 120 mg by mouth daily.    . furosemide (LASIX) 40 MG tablet Take 1 tablet (40 mg total) by mouth daily. 90 tablet 3  . metoprolol succinate (TOPROL-XL) 25 MG 24 hr tablet TAKE 1 TABLET ONCE DAILY. 90 tablet 2  . montelukast (SINGULAIR) 10 MG tablet Take 10 mg by mouth daily.    . nitroGLYCERIN (NITROSTAT) 0.4 MG SL tablet Place 1 tablet (0.4  mg total) under the tongue every 5 (five) minutes as needed for chest pain. 25 tablet 12  . omeprazole (PRILOSEC) 20 MG capsule Take 20 mg by mouth daily.    . potassium chloride (K-DUR) 10 MEQ tablet Take 1 tablet (10 mEq total) by mouth daily. 90 tablet 3  . rosuvastatin (CRESTOR) 20 MG tablet Take 1 tablet (20 mg total) by mouth daily. 30 tablet 8   No current facility-administered medications for this visit.      Allergies:   Augmentin [amoxicillin-pot clavulanate]   Social History   Social History  . Marital status: Legally Separated    Spouse name: N/A  . Number of children: N/A  . Years of education: N/A   Occupational History  . Truck Geophysicist/field seismologist    Social History Main Topics  . Smoking status: Never Smoker  . Smokeless tobacco: Never Used  . Alcohol use Yes  . Drug use: No  . Sexual activity: Not  Asked   Other Topics Concern  . None   Social History Narrative  . None     Family History:  Family History  Problem Relation Age of Onset  . CAD Father     2 stents ~ 55  . Heart attack    . Hyperlipidemia       ROS:   Please see the history of present illness.  All other systems are reviewed and otherwise negative.    PHYSICAL EXAM:   VS:  BP 118/78   Pulse 83   Ht '5\' 11"'  (1.803 m)   Wt (!) 335 lb 4 oz (152.1 kg)   SpO2 96%   BMI 46.76 kg/m   BMI: Body mass index is 46.76 kg/m. GEN: Well nourished, well developed morbidly obese WM in no acute distress  HEENT: normocephalic, atraumatic Neck: no JVD, carotid bruits, or masses Cardiac: RRR; no murmurs, rubs, or gallops, no edema Respiratory: markedly diminished BS L base, otherwise clear to auscultation bilaterally, normal work of breathing GI: soft, nontender, nondistended, + BS MS: no deformity or atrophy  Skin: warm and dry, no rash, no sig trauma to affected area of pain Neuro:  Alert and Oriented x 3, Strength and sensation are intact, follows commands Psych: euthymic mood, full affect  Wt Readings  from Last 3 Encounters:  04/07/17 (!) 335 lb 4 oz (152.1 kg)  01/05/17 (!) 334 lb (151.5 kg)  12/09/16 (!) 337 lb 8 oz (153.1 kg)      Studies/Labs Reviewed:   EKG:  EKG was ordered today and personally reviewed by me and demonstrates NSR 61bpm, prior septal infarct, TWI III, V2-V3, LVH. Similar to prior.  Recent Labs: 11/26/2016: Hemoglobin 13.5; Platelets 89 12/29/2016: ALT 83; BUN 20; Creatinine, Ser 1.44; Potassium 4.2; Sodium 145; TSH 2.410   Lipid Panel    Component Value Date/Time   CHOL 145 11/26/2016 0912   TRIG 92 11/26/2016 0912   HDL 53 11/26/2016 0912   CHOLHDL 2.7 11/26/2016 0912   VLDL 18 11/26/2016 0912   LDLCALC 74 11/26/2016 0912    Additional studies/ records that were reviewed today include: Summarized above    ASSESSMENT & PLAN:   1. Chest pain with mixed typical/atypical features - first type of chest pain (band-like) is atypical for coronary ischemia, sounds more musculoskeletal given that it improves when he stretches out and lays back. The second discomfort is more typical and concerning. Will check stat troponin given persistent sx >2 weeks. If abnormal, will need to be called to go to the hospital. Will also check basic labs to rule out obvious etiology. If the above is unrevealing, plan to titrate anginal regimen and proceed with stress test for evaluation. It appears from prior office notes he's had some exertional chest discomfort since his CABG. Warning sx reviewed with patient. Also advised him that per DOT guidelines he should not drive until cleared from cardiac perspective. 2. CAD - see above. Continue BB, statin. Continue ASA but once coronary status has been clarified, may consider discontinuing aspirin down the road given his chronic thrombocytopenia. 3. Paroxysmal atrial flutter - maintaining NSR. Check CBC to ensure no occult anemia causing sx. 4. Chronic combined CHF - symptoms could represent volume overload so will check pBNP to assess.  Continue current regimen.  Disposition: F/u with APP shortly after nuclear stress test.   Medication Adjustments/Labs and Tests Ordered: Current medicines are reviewed at length with the patient today.  Concerns regarding medicines are outlined above. Medication  changes, Labs and Tests ordered today are summarized above and listed in the Patient Instructions accessible in Encounters.   Raechel Ache PA-C  04/07/2017 2:26 PM    [facility] [address], [place], [place]  [address] Phone: [phone]; Fax: [phone]

## 2017-04-08 ENCOUNTER — Ambulatory Visit (HOSPITAL_COMMUNITY): Payer: BLUE CROSS/BLUE SHIELD | Attending: Cardiovascular Disease

## 2017-04-08 ENCOUNTER — Telehealth: Payer: Self-pay | Admitting: Cardiology

## 2017-04-08 DIAGNOSIS — I510 Cardiac septal defect, acquired: Secondary | ICD-10-CM | POA: Diagnosis not present

## 2017-04-08 DIAGNOSIS — R9439 Abnormal result of other cardiovascular function study: Secondary | ICD-10-CM | POA: Diagnosis not present

## 2017-04-08 DIAGNOSIS — I517 Cardiomegaly: Secondary | ICD-10-CM | POA: Insufficient documentation

## 2017-04-08 DIAGNOSIS — R072 Precordial pain: Secondary | ICD-10-CM | POA: Diagnosis not present

## 2017-04-08 LAB — PRO B NATRIURETIC PEPTIDE: NT-PRO BNP: 32 pg/mL (ref 0–86)

## 2017-04-08 LAB — TROPONIN I

## 2017-04-08 MED ORDER — TECHNETIUM TC 99M TETROFOSMIN IV KIT
32.4000 | PACK | Freq: Once | INTRAVENOUS | Status: AC | PRN
  Administered 2017-04-08: 32.4 via INTRAVENOUS
  Filled 2017-04-08: qty 33

## 2017-04-08 MED ORDER — ISOSORBIDE MONONITRATE ER 30 MG PO TB24: 15.0000 mg | ORAL_TABLET | Freq: Every day | ORAL | 1 refills | Status: DC

## 2017-04-08 MED ORDER — REGADENOSON 0.4 MG/5ML IV SOLN
0.4000 mg | Freq: Once | INTRAVENOUS | Status: AC
Start: 2017-04-08 — End: 2017-04-08
  Administered 2017-04-08: 0.4 mg via INTRAVENOUS

## 2017-04-08 NOTE — Telephone Encounter (Signed)
-----   Message from Charlie Pitter, [place] sent at 04/08/2017  7:44 AM EDT ----- Labs are generally unrevealing for cause of symptoms. Stable kidney function abnormality and known low platelet level. Proceed with stress test as planned. Add trial of Imdur 15mg  daily. Dayna Dunn PA-C

## 2017-04-08 NOTE — Telephone Encounter (Signed)
Pt returning call concerning his lab results. Please call pt.

## 2017-04-09 ENCOUNTER — Encounter: Payer: Self-pay | Admitting: Cardiology

## 2017-04-10 ENCOUNTER — Ambulatory Visit (HOSPITAL_COMMUNITY): Payer: BLUE CROSS/BLUE SHIELD | Attending: Cardiology

## 2017-04-10 MED ORDER — TECHNETIUM TC 99M TETROFOSMIN IV KIT
32.6000 | PACK | Freq: Once | INTRAVENOUS | Status: AC | PRN
  Administered 2017-04-10: 32.6 via INTRAVENOUS
  Filled 2017-04-10: qty 33

## 2017-04-13 ENCOUNTER — Ambulatory Visit (INDEPENDENT_AMBULATORY_CARE_PROVIDER_SITE_OTHER): Payer: BLUE CROSS/BLUE SHIELD | Admitting: Cardiology

## 2017-04-13 ENCOUNTER — Encounter: Payer: Self-pay | Admitting: Cardiology

## 2017-04-13 VITALS — BP 116/73 | HR 70 | Ht 71.0 in | Wt 335.8 lb

## 2017-04-13 DIAGNOSIS — Z951 Presence of aortocoronary bypass graft: Secondary | ICD-10-CM

## 2017-04-13 DIAGNOSIS — N183 Chronic kidney disease, stage 3 unspecified: Secondary | ICD-10-CM

## 2017-04-13 DIAGNOSIS — I2510 Atherosclerotic heart disease of native coronary artery without angina pectoris: Secondary | ICD-10-CM | POA: Diagnosis not present

## 2017-04-13 DIAGNOSIS — R079 Chest pain, unspecified: Secondary | ICD-10-CM

## 2017-04-13 DIAGNOSIS — E784 Other hyperlipidemia: Secondary | ICD-10-CM | POA: Diagnosis not present

## 2017-04-13 DIAGNOSIS — I4892 Unspecified atrial flutter: Secondary | ICD-10-CM

## 2017-04-13 DIAGNOSIS — I10 Essential (primary) hypertension: Secondary | ICD-10-CM

## 2017-04-13 DIAGNOSIS — D696 Thrombocytopenia, unspecified: Secondary | ICD-10-CM

## 2017-04-13 DIAGNOSIS — E7849 Other hyperlipidemia: Secondary | ICD-10-CM

## 2017-04-13 LAB — MYOCARDIAL PERFUSION IMAGING
LHR: 0.37
LV dias vol: 208 mL (ref 62–150)
LV sys vol: 100 mL
NUC STRESS TID: 0.98
Peak HR: 75 {beats}/min
Rest HR: 45 {beats}/min
SDS: 1
SRS: 4
SSS: 5

## 2017-04-13 MED ORDER — PANTOPRAZOLE SODIUM 40 MG PO TBEC: 40.0000 mg | DELAYED_RELEASE_TABLET | Freq: Every day | ORAL | 6 refills | Status: DC

## 2017-04-13 NOTE — Progress Notes (Signed)
Cardiology Office Note   Date:  04/13/2017   ID:  Sean [name], DOB 1971/02/15, MRN [id number]  PCP:  Raina Mina., MD  Cardiologist:  Dr. Meda Coffee    Chief Complaint  Patient presents with  . Chest Pain      History of Present Illness: Sean [name] is a 46 y.o. male who presents for follow up after nuc study for chest discomfort.   He has a history of CAD s/p CABG 12/2015 for singe vessel. Off pump with LIMA to LAD  Also hx of atrial flutter, rightward heart rotation, on cardiac CT but no dextrocardia.   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 He did have further disease with mRCA to dRCA of 20% stenosis, ost 1st marginal lesion 40 % stenosed.    Also hx of 02/12/16 with atrial fib/flutter RVR - he did not initially tolerate cardizem drip due to hypotension, occurred in setting of nasal congestion use. Underwent TEE/DCCV 02/14/16 with TEE showing no PFO (aneurysmal atrial septum), mild MR, LVEF 35-40%, small mostly posterior pericardial effusion, successful DCCV to NSR. He subsequent had recurrent atrial flutter 02/26/16. He was on Eliquis and underwent DCCV that was successful.  By 03/2016 on Echo EF was 45-50%, G2DD.  He was seen 04/07/17 with chest pressure, chronic DOE and LEE.  Notes he has has some exertional chest pressure since CABG.  He was maintaining SR. He wears lower ext compression stocking or his lower ext edema would be significant.    nuc study 04/10/17 with EF 52% septal hypokinesis -inferior defect consistent with probable soft tissue attenuation, apical thinning no significant scar, overall low  risk scan.  Troponin on 04/07/17 was neg. Pro BNP 32, plts 83, cr. Stable at 1.49   Today he continues with chest pain, he has not really seen a difference with the imdur.  Pain began 2-3 weeks ago last 30 min other times 1-2 hours.  Today he has started with diarrhea.  3 loose stools a day.   Discussed pepto bis mal or imodium.  Denies bloody stools.  His discomfort is upper abd.  To lower sterna.  He has increased gas as well.  He is on prilosec.  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       Past Medical History:  Diagnosis Date  . Abnormal liver enzymes    a. Sees a doctor in [place].  . Cardiac cirrhosis    a. possible elevated LFTs/low platelets felt due to cardiac cirrhosis per 2017 admission.  . Chronic combined systolic and diastolic CHF (congestive heart failure) ([place])   . CKD (chronic kidney disease), stage II   . Congenital heart defect    a. rightward rotation of heart, almost dextrocardia  . Coronary artery disease    a. s/p CABGx1 in 12/2015.  Marland Kitchen Hematuria    a. Chronic hx of this, no prior etiology determined through workup per patient.  . Hypercholesteremia    a. Prev taken off statin due to abnormal liver function.  . Hypertension   . Morbid obesity ([place])   . Paroxysmal atrial flutter ([place]) 02/12/2016  .  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 Sinus bradycardia   . Thrombocytopenia ([place])     Past Surgical History:  Procedure Laterality Date  . APPENDECTOMY    . CARDIAC CATHETERIZATION N/A 12/24/2015   Procedure: Right/Left Heart Cath and Coronary Angiography;  Surgeon: Jolaine Artist, MD;  Location: [facility];  Service: Cardiovascular;  Laterality: N/A;  . CARDIOVERSION N/A 02/14/2016   Procedure: CARDIOVERSION;  Surgeon: Pixie Casino, MD;  Location: [facility] ENDOSCOPY;  Service: Cardiovascular;  Laterality: N/A;  . CORONARY ARTERY BYPASS GRAFT N/A 12/26/2015   Procedure: Off Pump Coronary Artery Bypass  Grafting times one using left internal mammary artery;  Surgeon: Rexene Alberts, MD;  Location: [facility];  Service: Open Heart Surgery;  Laterality: N/A;  . GALLBLADDER SURGERY    . HERNIA REPAIR    . TEE WITHOUT CARDIOVERSION N/A 12/26/2015   Procedure: TRANSESOPHAGEAL ECHOCARDIOGRAM (TEE);  Surgeon: Rexene Alberts, MD;  Location: [facility];  Service: Open Heart Surgery;  Laterality: N/A;  . TEE WITHOUT CARDIOVERSION N/A 02/14/2016   Procedure: TRANSESOPHAGEAL ECHOCARDIOGRAM (TEE);  Surgeon: Pixie Casino, MD;  Location: [facility] ENDOSCOPY;  Service: Cardiovascular;  Laterality: N/A;     Current Outpatient Prescriptions  Medication Sig Dispense Refill  . allopurinol (ZYLOPRIM) 100 MG tablet Take 1 tablet (100 mg total) by mouth daily. 30 tablet 1  . apixaban (ELIQUIS) 5 MG TABS tablet Take 1 tablet (5 mg total) by mouth 2 (two) times daily. 60 tablet 5  . aspirin 81 MG tablet Take 1 tablet (81 mg total) by mouth daily.    Marland Kitchen diltiazem (CARDIZEM) 120 MG tablet Take 120 mg by mouth daily.    . isosorbide mononitrate (IMDUR) 30 MG 24 hr tablet Take 0.5 tablets (15 mg total) by mouth daily. 45 tablet 1  . metoprolol succinate (TOPROL-XL) 25 MG 24 hr tablet Take 25 mg by mouth daily.    . montelukast (SINGULAIR) 10 MG tablet Take 10 mg by mouth daily.    . nitroGLYCERIN (NITROSTAT) 0.4 MG SL tablet Place 1 tablet (0.4 mg total) under the tongue every 5 (five) minutes as needed for chest pain. 25 tablet 12  . potassium chloride (K-DUR) 10 MEQ tablet Take 1 tablet (10 mEq total) by mouth daily. 90 tablet 3  . rosuvastatin (CRESTOR) 20 MG tablet Take 1 tablet (20 mg total) by mouth daily. 30 tablet 8  . furosemide (LASIX) 40 MG tablet Take 1 tablet (40 mg total) by mouth daily. 90 tablet 3  . pantoprazole (PROTONIX) 40 MG tablet Take 1 tablet (40 mg total) by mouth daily. 37 tablet 6   No current facility-administered medications for this visit.     Allergies:   Augmentin [amoxicillin-pot clavulanate]     Social History:  The patient  reports that he has never smoked. He has never used smokeless tobacco. He reports that he drinks alcohol. He reports that he does not use drugs.   Family History:  The patient's family history includes CAD in his father.    ROS:  General:no colds or fevers, no weight changes Skin:no rashes or ulcers HEENT:no blurred vision, no congestion CV:see HPI PUL:see HPI GI:+ diarrhea no constipation or melena, + indigestion GU:no hematuria, no dysuria MS:no joint pain, no claudication Neuro:no syncope, no lightheadedness Endo:no diabetes, no thyroid disease  Wt Readings from Last 3 Encounters:  04/13/17 (!) 335 lb 12.8 oz (152.3 kg)  04/07/17 (!) 335 lb 4 oz (152.1  kg)  01/05/17 (!) 334 lb (151.5 kg)     PHYSICAL EXAM: VS:  BP 116/73   Pulse 70   Ht 5\' 11"  (1.803 m)   Wt (!) 335 lb 12.8 oz (152.3 kg)   BMI 46.83 kg/m   , BMI Body mass index is 46.83 kg/m. General:Pleasant affect but flat, NAD Skin:Warm and dry, brisk capillary refill HEENT:normocephalic, sclera clear, mucus membranes moist Neck:supple, no JVD, no bruits  Heart:S1S2 RRR without murmur, gallup, rub or click Lungs:clear without rales, rhonchi, or wheezes SAY:TKZSW, soft, non tender, + BS, do not palpate liver spleen or masses Ext:no lower ext edema, 2+ pedal pulses, 2+ radial pulses Neuro:alert and oriented X 3, MAE, follows commands, + facial symmetry    EKG:  EKG is NOT ordered today.  Recent Labs: 11/26/2016: Hemoglobin 13.5 12/29/2016: ALT 83; TSH 2.410 04/07/2017: BUN 19; Creatinine, Ser 1.49; NT-Pro BNP 32; Platelets 83; Potassium 4.4; Sodium 139    Lipid Panel    Component Value Date/Time   CHOL 145 11/26/2016 0912   TRIG 92 11/26/2016 0912   HDL 53 11/26/2016 0912   CHOLHDL 2.7 11/26/2016 0912   VLDL 18 11/26/2016 0912   LDLCALC 74 11/26/2016 0912       Other studies Reviewed: Additional studies/ records that were reviewed today include: . NUC study 04/10/17    Study Highlights    Nuclear stress EF: 52%. Septal hypokinesis  Inferior defect consistent with probable soft tissue attenuation (diaphragm, bowel activity) Apical thinning No signif ischemia or scar  Overall low risk scan      Echo 03/2016 Study Conclusions  - Procedure narrative: Transthoracic echocardiography. Image   quality was suboptimal. The study was technically difficult.   Intravenous contrast (Definity) was administered. - Left ventricle: The cavity size was normal. Wall thickness was   normal. Systolic function was mildly reduced. The estimated   ejection fraction was in the range of 45% to 50%. Features are   consistent with a pseudonormal left ventricular filling pattern,   with concomitant abnormal relaxation and increased filling   pressure (grade 2 diastolic dysfunction).  Impressions:  - The echo is extremely difficult. The views of the LV with   Definity contrast shows that the LV function is likely normal.   Specific wall motin information is not able to be determined by   this echo.   No specific valvular information can be stated on this echo   ASSESSMENT AND PLAN:  1.  chest pain continues wit the imdur and he does have a headache.  nuc is neg for ischemia but I discussed with Dr. 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  Discussed with pt and he is agreeable.  If stable CAD would stop imdur.  Last cath they had to go through groin  The patient understands that risks included but are not limited to stroke (1 in 1000), death (1 in 1000), kidney failure [usually temporary] (1 in 500), bleeding (1 in 200), allergic reaction [possibly serious] (1 in 200).     2.  GERD this has increased for now stop prilosec and begin protonix 40 mg BID for 1 week then 40 mg daily.     3.  CAD with hx of off pump CABG LIMA to LAD.  He has had some chronic chest pain since surgery but now this is different. On BB and  statin  4. Anterior rotation of the cardiac apex, no obvious  dextrocardia or other anomalies such as situs inversus  On cardiac CT due to the severity of rotation   5. PAF on Eliquis none tonight and none until cath. On Wed.  Maintaining SR   6.  Hyperlipidemia on crestor in Dec LDL was 74  7.  thrombocytopenic, 83,000 on last check.    8. Diarrhea, 3 stools per day.  Will try imodium.     Current medicines are reviewed with the patient today.  The patient Has no concerns regarding medicines.  The following changes have been made:  See above Labs/ tests ordered today include:see above  Disposition:   FU:  see above  Signed, Cecilie Kicks, NP  04/13/2017 4:23 PM    [facility] [place], [place], [place], [place] Phone: [phone]; Fax: [phone]

## 2017-04-13 NOTE — Patient Instructions (Signed)
Medication Instructions:  Your physician has recommended you make the following change in your medication:  1. Discontinue prilosec 2. Start Protonix (40 mg ) twice daily for one week, than ( 40 mg ) daily.   Labwork: Your physician recommends that you have lab work ptt/pt/inr   Testing/Procedures: -None  Follow-Up: Your physician recommends that you keep your scheduled  follow-up appointment with Sandria Senter.   Any Other Special Instructions Will Be Listed Below (If Applicable).   Your provider has recommended a cardiac catherization  You are scheduled for a cardiac catheterization on Wednesday with Dr. Claiborne Billings or associate.  Please arrive at the [facility] (Main Entrance) at [facility] at [address] [place] Stay on Wednesday at 6:30 am .    Special note: Every effort is made to have your procedure done on time.   Please understand that emergencies sometimes delay a scheduled   procedure.  No food or drink after midnight on Tuesday, May 15. On the morning of your procedure, take all your medications except el You may take your morning medications with a sip of water on the day of your procedure.  Medications to HOLD - Eliquis 48 hours prior to procedure.  Plan for a one night stay -- bring personal belongings.  Bring a current list of your medications and current insurance cards.  You MUST have a responsible person to drive you home. Someone MUST be with you the first 24 hours after you arrive home or your discharge will be delayed. Wear clothes that are easy to get on and off and wear slip on shoes.    Coronary Angiogram A coronary angiogram, also called coronary angiography, is an X-ray procedure used to look at the arteries in the heart. In this procedure, a dye (contrast dye) is injected through a long, hollow tube (catheter). The catheter is about the size of a piece of cooked spaghetti and is inserted through your groin, wrist, or  arm. The dye is injected into each artery, and X-rays are then taken to show if there is a blockage in the arteries of your heart.  LET [facility] CARE PROVIDER KNOW ABOUT:  Any allergies you have, including allergies to shellfish or contrast dye.   All medicines you are taking, including vitamins, herbs, eye drops, creams, and over-the-counter medicines.   Previous problems you or members of your family have had with the use of anesthetics.   Any blood disorders you have.   Previous surgeries you have had.  History of kidney problems or failure.   Other medical conditions you have.  RISKS AND COMPLICATIONS  Generally, a coronary angiogram is a safe procedure. However, about 1 person out of 1000 can have problems that may include:  Allergic reaction to the dye.  Bleeding/bruising from the access site or other locations.  Kidney injury, especially in people with impaired kidney function.  Stroke (rare).  Heart attack (rare).  Irregular rhythms (rare)  Death (rare)  BEFORE THE PROCEDURE   Do not eat or drink anything after midnight the night before the procedure or as directed by your health care provider.   Ask your health care provider about changing or stopping your regular medicines. This is especially important if you are taking diabetes medicines or blood thinners.  PROCEDURE  You may be given a medicine to help you relax (sedative) before the procedure. This medicine is given through an intravenous (IV) access tube that is inserted into  one of your veins.   The area where the catheter will be inserted will be washed and shaved. This is usually done in the groin but may be done in the fold of your arm (near your elbow) or in the wrist.   A medicine will be given to numb the area where the catheter will be inserted (local anesthetic).   The health care provider will insert the catheter into an artery. The catheter will be guided by using a special type  of X-ray (fluoroscopy) of the blood vessel being examined.   A special dye will then be injected into the catheter, and X-rays will be taken. The dye will help to show where any narrowing or blockages are located in the heart arteries.    AFTER THE PROCEDURE   If the procedure is done through the leg, you will be kept in bed lying flat for several hours. You will be instructed to not bend or cross your legs.  The insertion site will be checked frequently.   The pulse in your feet or wrist will be checked frequently.   Additional blood tests, X-rays, and an electrocardiogram may be done.      If you need a refill on your cardiac medications before your next appointment, please call your pharmacy.

## 2017-04-14 ENCOUNTER — Telehealth: Payer: Self-pay | Admitting: Cardiology

## 2017-04-14 LAB — CBC WITH DIFFERENTIAL/PLATELET
Basophils Absolute: 0 10*3/uL (ref 0.0–0.2)
Basos: 0 %
EOS (ABSOLUTE): 0.4 10*3/uL (ref 0.0–0.4)
EOS: 5 %
HEMATOCRIT: 43.8 % (ref 37.5–51.0)
HEMOGLOBIN: 14.6 g/dL (ref 13.0–17.7)
Immature Grans (Abs): 0 10*3/uL (ref 0.0–0.1)
Immature Granulocytes: 1 %
LYMPHS ABS: 1.9 10*3/uL (ref 0.7–3.1)
Lymphs: 24 %
MCH: 28.9 pg (ref 26.6–33.0)
MCHC: 33.3 g/dL (ref 31.5–35.7)
MCV: 87 fL (ref 79–97)
MONOS ABS: 0.9 10*3/uL (ref 0.1–0.9)
Monocytes: 11 %
NEUTROS PCT: 59 %
Neutrophils Absolute: 4.7 10*3/uL (ref 1.4–7.0)
Platelets: 98 10*3/uL — CL (ref 150–379)
RBC: 5.06 x10E6/uL (ref 4.14–5.80)
RDW: 15.8 % — AB (ref 12.3–15.4)
WBC: 8 10*3/uL (ref 3.4–10.8)

## 2017-04-14 LAB — PROTIME-INR
INR: 1 (ref 0.8–1.2)
Prothrombin Time: 10.4 s (ref 9.1–12.0)

## 2017-04-14 NOTE — Telephone Encounter (Signed)
-----   Message from Isaiah Serge, NP sent at 04/14/2017  1:45 PM EDT ----- Labs ready for cath.  BMET was done last week.

## 2017-04-14 NOTE — Telephone Encounter (Signed)
Follow Up: ° ° ° ° ° °Returning your call from yesterday, concerning his lab results. °

## 2017-04-15 ENCOUNTER — Telehealth: Payer: Self-pay | Admitting: Physician Assistant

## 2017-04-15 ENCOUNTER — Encounter (HOSPITAL_COMMUNITY): Payer: Self-pay | Admitting: *Deleted

## 2017-04-15 ENCOUNTER — Encounter (HOSPITAL_COMMUNITY)
Admission: RE | Disposition: A | Discharge status: Home / Self Care | Payer: Self-pay | Source: Ambulatory Visit | Attending: Cardiovascular Disease

## 2017-04-15 ENCOUNTER — Ambulatory Visit (HOSPITAL_COMMUNITY)
Admission: RE | Admit: 2017-04-15 | Discharge: 2017-04-15 | Disposition: A | Discharge status: Home / Self Care | Payer: BLUE CROSS/BLUE SHIELD | Source: Ambulatory Visit | Attending: Cardiovascular Disease | Admitting: Cardiovascular Disease

## 2017-04-15 DIAGNOSIS — I25119 Atherosclerotic heart disease of native coronary artery with unspecified angina pectoris: Secondary | ICD-10-CM | POA: Diagnosis not present

## 2017-04-15 DIAGNOSIS — I5042 Chronic combined systolic (congestive) and diastolic (congestive) heart failure: Secondary | ICD-10-CM | POA: Insufficient documentation

## 2017-04-15 DIAGNOSIS — I130 Hypertensive heart and chronic kidney disease with heart failure and stage 1 through stage 4 chronic kidney disease, or unspecified chronic kidney disease: Secondary | ICD-10-CM | POA: Diagnosis not present

## 2017-04-15 DIAGNOSIS — Z7982 Long term (current) use of aspirin: Secondary | ICD-10-CM | POA: Insufficient documentation

## 2017-04-15 DIAGNOSIS — E7800 Pure hypercholesterolemia, unspecified: Secondary | ICD-10-CM | POA: Diagnosis not present

## 2017-04-15 DIAGNOSIS — Z951 Presence of aortocoronary bypass graft: Secondary | ICD-10-CM | POA: Insufficient documentation

## 2017-04-15 DIAGNOSIS — R197 Diarrhea, unspecified: Secondary | ICD-10-CM | POA: Diagnosis not present

## 2017-04-15 DIAGNOSIS — G8929 Other chronic pain: Secondary | ICD-10-CM | POA: Diagnosis not present

## 2017-04-15 DIAGNOSIS — I2582 Chronic total occlusion of coronary artery: Secondary | ICD-10-CM | POA: Insufficient documentation

## 2017-04-15 DIAGNOSIS — N182 Chronic kidney disease, stage 2 (mild): Secondary | ICD-10-CM | POA: Insufficient documentation

## 2017-04-15 DIAGNOSIS — D696 Thrombocytopenia, unspecified: Secondary | ICD-10-CM | POA: Diagnosis not present

## 2017-04-15 DIAGNOSIS — I4892 Unspecified atrial flutter: Secondary | ICD-10-CM | POA: Insufficient documentation

## 2017-04-15 DIAGNOSIS — I480 Paroxysmal atrial fibrillation: Secondary | ICD-10-CM | POA: Diagnosis not present

## 2017-04-15 DIAGNOSIS — K219 Gastro-esophageal reflux disease without esophagitis: Secondary | ICD-10-CM | POA: Insufficient documentation

## 2017-04-15 DIAGNOSIS — Z7901 Long term (current) use of anticoagulants: Secondary | ICD-10-CM | POA: Insufficient documentation

## 2017-04-15 DIAGNOSIS — Z6841 Body Mass Index (BMI) 40.0 and over, adult: Secondary | ICD-10-CM | POA: Insufficient documentation

## 2017-04-15 DIAGNOSIS — Z880 Allergy status to penicillin: Secondary | ICD-10-CM | POA: Insufficient documentation

## 2017-04-15 HISTORY — PX: LEFT HEART CATH AND CORS/GRAFTS ANGIOGRAPHY: CATH118250

## 2017-04-15 SURGERY — LEFT HEART CATH AND CORS/GRAFTS ANGIOGRAPHY
Anesthesia: LOCAL

## 2017-04-15 MED ORDER — ASPIRIN 81 MG PO CHEW
81.0000 mg | CHEWABLE_TABLET | ORAL | Status: AC
  Administered 2017-04-15: 81 mg via ORAL

## 2017-04-15 MED ORDER — SODIUM CHLORIDE 0.9 % IV SOLN: 250.0000 mL | INTRAVENOUS | Status: DC | PRN

## 2017-04-15 MED ORDER — SODIUM CHLORIDE 0.9% FLUSH: 3.0000 mL | Freq: Two times a day (BID) | INTRAVENOUS | Status: DC

## 2017-04-15 MED ORDER — FENTANYL CITRATE (PF) 100 MCG/2ML IJ SOLN
INTRAMUSCULAR | Status: DC | PRN
  Administered 2017-04-15: 50 ug via INTRAVENOUS

## 2017-04-15 MED ORDER — MIDAZOLAM HCL 2 MG/2ML IJ SOLN
INTRAMUSCULAR | Status: AC
  Filled 2017-04-15: qty 2

## 2017-04-15 MED ORDER — ASPIRIN 81 MG PO CHEW: 81.0000 mg | CHEWABLE_TABLET | Freq: Every day | ORAL | Status: DC

## 2017-04-15 MED ORDER — MIDAZOLAM HCL 2 MG/2ML IJ SOLN
INTRAMUSCULAR | Status: DC | PRN
  Administered 2017-04-15: 2 mg via INTRAVENOUS

## 2017-04-15 MED ORDER — SODIUM CHLORIDE 0.9% FLUSH: 3.0000 mL | INTRAVENOUS | Status: DC | PRN

## 2017-04-15 MED ORDER — SODIUM CHLORIDE 0.9 % IV SOLN
INTRAVENOUS | Status: DC
  Administered 2017-04-15: 12:00:00 via INTRAVENOUS

## 2017-04-15 MED ORDER — LIDOCAINE HCL (PF) 1 % IJ SOLN
INTRAMUSCULAR | Status: DC | PRN
  Administered 2017-04-15: 20 mL

## 2017-04-15 MED ORDER — DIAZEPAM 5 MG PO TABS: 5.0000 mg | ORAL_TABLET | Freq: Four times a day (QID) | ORAL | Status: DC | PRN

## 2017-04-15 MED ORDER — SODIUM CHLORIDE 0.9 % WEIGHT BASED INFUSION
3.0000 mL/kg/h | INTRAVENOUS | Status: AC
  Administered 2017-04-15: 3 mL/kg/h via INTRAVENOUS

## 2017-04-15 MED ORDER — HEPARIN (PORCINE) IN NACL 2-0.9 UNIT/ML-% IJ SOLN
INTRAMUSCULAR | Status: AC
  Filled 2017-04-15: qty 1000

## 2017-04-15 MED ORDER — IOPAMIDOL (ISOVUE-370) INJECTION 76%
INTRAVENOUS | Status: DC | PRN
  Administered 2017-04-15: 135 mL via INTRA_ARTERIAL

## 2017-04-15 MED ORDER — LIDOCAINE HCL (PF) 1 % IJ SOLN
INTRAMUSCULAR | Status: AC
  Filled 2017-04-15: qty 30

## 2017-04-15 MED ORDER — ASPIRIN 81 MG PO CHEW
CHEWABLE_TABLET | ORAL | Status: AC
  Administered 2017-04-15: 81 mg via ORAL
  Filled 2017-04-15: qty 1

## 2017-04-15 MED ORDER — IOPAMIDOL (ISOVUE-370) INJECTION 76%
INTRAVENOUS | Status: AC
  Filled 2017-04-15: qty 100

## 2017-04-15 MED ORDER — FENTANYL CITRATE (PF) 100 MCG/2ML IJ SOLN
INTRAMUSCULAR | Status: AC
Start: 2017-04-15 — End: ?
  Filled 2017-04-15: qty 2

## 2017-04-15 MED ORDER — IOPAMIDOL (ISOVUE-370) INJECTION 76%
INTRAVENOUS | Status: AC
  Filled 2017-04-15: qty 50

## 2017-04-15 MED ORDER — ACETAMINOPHEN 325 MG PO TABS: 650.0000 mg | ORAL_TABLET | ORAL | Status: DC | PRN

## 2017-04-15 MED ORDER — SODIUM CHLORIDE 0.9 % WEIGHT BASED INFUSION: 1.0000 mL/kg/h | INTRAVENOUS | Status: DC

## 2017-04-15 MED ORDER — HEPARIN (PORCINE) IN NACL 2-0.9 UNIT/ML-% IJ SOLN
INTRAMUSCULAR | Status: AC | PRN
  Administered 2017-04-15: 1000 mL

## 2017-04-15 MED ORDER — ONDANSETRON HCL 4 MG/2ML IJ SOLN: 4.0000 mg | Freq: Four times a day (QID) | INTRAMUSCULAR | Status: DC | PRN

## 2017-04-15 SURGICAL SUPPLY — 11 items
CATH INFINITI 5 FR 3DRC (CATHETERS) ×1 IMPLANT
CATH INFINITI 5 FR IM (CATHETERS) ×2 IMPLANT
CATH INFINITI 5FR JL5 (CATHETERS) ×2 IMPLANT
CATH INFINITI 5FR MULTPACK ANG (CATHETERS) ×2 IMPLANT
GUIDEWIRE 3MM J TIP .035 145 (WIRE) ×2 IMPLANT
KIT HEART LEFT (KITS) ×2 IMPLANT
PACK CARDIAC CATHETERIZATION (CUSTOM PROCEDURE TRAY) ×2 IMPLANT
SHEATH PINNACLE 5F 10CM (SHEATH) ×1 IMPLANT
SYR MEDRAD MARK V 150ML (SYRINGE) ×2 IMPLANT
TRANSDUCER W/STOPCOCK (MISCELLANEOUS) ×2 IMPLANT
TUBING CIL FLEX 10 FLL-RA (TUBING) ×2 IMPLANT

## 2017-04-15 NOTE — Telephone Encounter (Signed)
Returned call to patient he stated he stopped taking Isosorbide 15 mg daily Monday 5/14 causing headaches and diarrhea.Stated he wanted to make sure Dr.Nelson is ok with him stopping.Advised Dr.Nelson out of office I will send message to her.

## 2017-04-15 NOTE — Telephone Encounter (Signed)
Follow Up:   Pt said his Stress Test and Cath  They both came out good.He wants to know if he needs to continue taking Isosorbide? He says it gives him a headache and makes him sick on his stomach.If he is not there,please leave him a message.

## 2017-04-15 NOTE — Progress Notes (Signed)
Called PA lindsey for cardiology. Pt to restart eliquis tomorrow, pt verbalized understanding.

## 2017-04-15 NOTE — Telephone Encounter (Signed)
Returned call to patient no answer.LMTC. 

## 2017-04-15 NOTE — Progress Notes (Signed)
C/o right groin pain post ambulation. Level 0 but continues to complain, cath lab was called, Judge Stall Rt came to also assess. Dressing was removed and then replaced. Level 0, pt to dc.

## 2017-04-15 NOTE — Interval H&P Note (Signed)
Cath Lab Visit (complete for each Cath Lab visit)  Clinical Evaluation Leading to the Procedure:   ACS: No.  Non-ACS:    Anginal Classification: CCS III  Anti-ischemic medical therapy: Maximal Therapy (2 or more classes of medications)  Non-Invasive Test Results: Low-risk stress test findings: cardiac mortality <1%/year  Prior CABG: Previous CABG      History and Physical Interval Note:  04/15/2017 8:45 AM  Sean [name]  has presented today for surgery, with the diagnosis of cp  The various methods of treatment have been discussed with the patient and family. After consideration of risks, benefits and other options for treatment, the patient has consented to  Procedure(s): Left Heart Cath and Cors/Grafts Angiography (N/A) as a surgical intervention .  The patient's history has been reviewed, patient examined, no change in status, stable for surgery.  I have reviewed the patient's chart and labs.  Questions were answered to the patient's satisfaction.     Shelva Majestic

## 2017-04-15 NOTE — Discharge Instructions (Signed)
Angiogram, Care After °This sheet gives you information about how to care for yourself after your procedure. Your health care provider may also give you more specific instructions. If you have problems or questions, contact your health care provider. °What can I expect after the procedure? °After the procedure, it is common to have bruising and tenderness at the catheter insertion area. °Follow these instructions at home: °Insertion site care  °· Follow instructions from your health care provider about how to take care of your insertion site. Make sure you: °¨ Wash your hands with soap and water before you change your bandage (dressing). If soap and water are not available, use hand sanitizer. °¨ Change your dressing as told by your health care provider. °¨ Leave stitches (sutures), skin glue, or adhesive strips in place. These skin closures may need to stay in place for 2 weeks or longer. If adhesive strip edges start to loosen and curl up, you may trim the loose edges. Do not remove adhesive strips completely unless your health care provider tells you to do that. °· Do not take baths, swim, or use a hot tub until your health care provider approves. °· You may shower 24-48 hours after the procedure or as told by your health care provider. °¨ Gently wash the site with plain soap and water. °¨ Pat the area dry with a clean towel. °¨ Do not rub the site. This may cause bleeding. °· Do not apply powder or lotion to the site. Keep the site clean and dry. °· Check your insertion site every day for signs of infection. Check for: °¨ Redness, swelling, or pain. °¨ Fluid or blood. °¨ Warmth. °¨ Pus or a bad smell. °Activity  °· Rest as told by your health care provider, usually for 1-2 days. °· Do not lift anything that is heavier than 10 lbs. (4.5 kg) or as told by your health care provider. °· Do not drive for 24 hours if you were given a medicine to help you relax (sedative). °· Do not drive or use heavy machinery while  taking prescription pain medicine. °General instructions  °· Return to your normal activities as told by your health care provider, usually in about a week. Ask your health care provider what activities are safe for you. °· If the catheter site starts bleeding, lie flat and put pressure on the site. If the bleeding does not stop, get help right away. This is a medical emergency. °· Drink enough fluid to keep your urine clear or pale yellow. This helps flush the contrast dye from your body. °· Take over-the-counter and prescription medicines only as told by your health care provider. °· Keep all follow-up visits as told by your health care provider. This is important. °Contact a health care provider if: °· You have a fever or chills. °· You have redness, swelling, or pain around your insertion site. °· You have fluid or blood coming from your insertion site. °· The insertion site feels warm to the touch. °· You have pus or a bad smell coming from your insertion site. °· You have bruising around the insertion site. °· You notice blood collecting in the tissue around the catheter site (hematoma). The hematoma may be painful to the touch. °Get help right away if: °· You have severe pain at the catheter insertion area. °· The catheter insertion area swells very fast. °· The catheter insertion area is bleeding, and the bleeding does not stop when you hold steady pressure on   the area. °· The area near or just beyond the catheter insertion site becomes pale, cool, tingly, or numb. °These symptoms may represent a serious problem that is an emergency. Do not wait to see if the symptoms will go away. Get medical help right away. Call your local emergency services (911 in the [place] Do not drive yourself to the hospital. °Summary °· After the procedure, it is common to have bruising and tenderness at the catheter insertion area. °· After the procedure, it is important to rest and drink plenty of fluids. °· Do not take baths,  swim, or use a hot tub until your health care provider says it is okay to do so. You may shower 24-48 hours after the procedure or as told by your health care provider. °· If the catheter site starts bleeding, lie flat and put pressure on the site. If the bleeding does not stop, get help right away. This is a medical emergency. °This information is not intended to replace advice given to you by your health care provider. Make sure you discuss any questions you have with your health care provider. °Document Released: 06/05/2005 Document Revised: 10/22/2016 Document Reviewed: 10/22/2016 °Elsevier Interactive Patient Education © 2017 [employer] ° °

## 2017-04-15 NOTE — H&P (View-Only) (Signed)
Cardiology Office Note   Date:  04/13/2017   ID:  Sean [name], DOB 1971/04/14, MRN [id number]  PCP:  Raina Mina., MD  Cardiologist:  Dr. Meda Coffee    Chief Complaint  Patient presents with  . Chest Pain      History of Present Illness: Sean [name] is a 46 y.o. male who presents for follow up after nuc study for chest discomfort.   He has a history of CAD s/p CABG 12/2015 for singe vessel. Off pump with LIMA to LAD  Also hx of atrial flutter, rightward heart rotation, on cardiac CT but no dextrocardia.   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 He did have further disease with mRCA to dRCA of 20% stenosis, ost 1st marginal lesion 40 % stenosed.    Also hx of 02/12/16 with atrial fib/flutter RVR - he did not initially tolerate cardizem drip due to hypotension, occurred in setting of nasal congestion use. Underwent TEE/DCCV 02/14/16 with TEE showing no PFO (aneurysmal atrial septum), mild MR, LVEF 35-40%, small mostly posterior pericardial effusion, successful DCCV to NSR. He subsequent had recurrent atrial flutter 02/26/16. He was on Eliquis and underwent DCCV that was successful.  By 03/2016 on Echo EF was 45-50%, G2DD.  He was seen 04/07/17 with chest pressure, chronic DOE and LEE.  Notes he has has some exertional chest pressure since CABG.  He was maintaining SR. He wears lower ext compression stocking or his lower ext edema would be significant.    nuc study 04/10/17 with EF 52% septal hypokinesis -inferior defect consistent with probable soft tissue attenuation, apical thinning no significant scar, overall low  risk scan.  Troponin on 04/07/17 was neg. Pro BNP 32, plts 83, cr. Stable at 1.49   Today he continues with chest pain, he has not really seen a difference with the imdur.  Pain began 2-3 weeks ago last 30 min other times 1-2 hours.  Today he has started with diarrhea.  3 loose stools a day.   Discussed pepto bis mal or imodium.  Denies bloody stools.  His discomfort is upper abd.  To lower sterna.  He has increased gas as well.  He is on prilosec.  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       Past Medical History:  Diagnosis Date  . Abnormal liver enzymes    a. Sees a doctor in [place].  . Cardiac cirrhosis    a. possible elevated LFTs/low platelets felt due to cardiac cirrhosis per 2017 admission.  . Chronic combined systolic and diastolic CHF (congestive heart failure) ([place])   . CKD (chronic kidney disease), stage II   . Congenital heart defect    a. rightward rotation of heart, almost dextrocardia  . Coronary artery disease    a. s/p CABGx1 in 12/2015.  Marland Kitchen Hematuria    a. Chronic hx of this, no prior etiology determined through workup per patient.  . Hypercholesteremia    a. Prev taken off statin due to abnormal liver function.  . Hypertension   . Morbid obesity ([place])   . Paroxysmal atrial flutter ([place]) 02/12/2016  .  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 Sinus bradycardia   . Thrombocytopenia ([place])     Past Surgical History:  Procedure Laterality Date  . APPENDECTOMY    . CARDIAC CATHETERIZATION N/A 12/24/2015   Procedure: Right/Left Heart Cath and Coronary Angiography;  Surgeon: Jolaine Artist, MD;  Location: [facility];  Service: Cardiovascular;  Laterality: N/A;  . CARDIOVERSION N/A 02/14/2016   Procedure: CARDIOVERSION;  Surgeon: Pixie Casino, MD;  [facility];  Service: Cardiovascular;  Laterality: N/A;  . CORONARY ARTERY BYPASS GRAFT N/A 12/26/2015   Procedure: Off Pump Coronary Artery Bypass  Grafting times one using left internal mammary artery;  Surgeon: Rexene Alberts, MD;  Location: [place];  Service: Open Heart Surgery;  Laterality: N/A;  . GALLBLADDER SURGERY    . HERNIA REPAIR    . TEE WITHOUT CARDIOVERSION N/A 12/26/2015   Procedure: TRANSESOPHAGEAL ECHOCARDIOGRAM (TEE);  Surgeon: Rexene Alberts, MD;  Location: [place];  Service: Open Heart Surgery;  Laterality: N/A;  . TEE WITHOUT CARDIOVERSION N/A 02/14/2016   Procedure: TRANSESOPHAGEAL ECHOCARDIOGRAM (TEE);  Surgeon: Pixie Casino, MD;  Location: [facility] ENDOSCOPY;  Service: Cardiovascular;  Laterality: N/A;     Current Outpatient Prescriptions  Medication Sig Dispense Refill  . allopurinol (ZYLOPRIM) 100 MG tablet Take 1 tablet (100 mg total) by mouth daily. 30 tablet 1  . apixaban (ELIQUIS) 5 MG TABS tablet Take 1 tablet (5 mg total) by mouth 2 (two) times daily. 60 tablet 5  . aspirin 81 MG tablet Take 1 tablet (81 mg total) by mouth daily.    Marland Kitchen diltiazem (CARDIZEM) 120 MG tablet Take 120 mg by mouth daily.    . isosorbide mononitrate (IMDUR) 30 MG 24 hr tablet Take 0.5 tablets (15 mg total) by mouth daily. 45 tablet 1  . metoprolol succinate (TOPROL-XL) 25 MG 24 hr tablet Take 25 mg by mouth daily.    . montelukast (SINGULAIR) 10 MG tablet Take 10 mg by mouth daily.    . nitroGLYCERIN (NITROSTAT) 0.4 MG SL tablet Place 1 tablet (0.4 mg total) under the tongue every 5 (five) minutes as needed for chest pain. 25 tablet 12  . potassium chloride (K-DUR) 10 MEQ tablet Take 1 tablet (10 mEq total) by mouth daily. 90 tablet 3  . rosuvastatin (CRESTOR) 20 MG tablet Take 1 tablet (20 mg total) by mouth daily. 30 tablet 8  . furosemide (LASIX) 40 MG tablet Take 1 tablet (40 mg total) by mouth daily. 90 tablet 3  . pantoprazole (PROTONIX) 40 MG tablet Take 1 tablet (40 mg total) by mouth daily. 37 tablet 6   No current facility-administered medications for this visit.     Allergies:   Augmentin [amoxicillin-pot clavulanate]     Social History:  The patient  reports that he has never smoked. He has never used smokeless tobacco. He reports that he drinks alcohol. He reports that he does not use drugs.   Family History:  The patient's family history includes CAD in his father.    ROS:  General:no colds or fevers, no weight changes Skin:no rashes or ulcers HEENT:no blurred vision, no congestion CV:see HPI PUL:see HPI GI:+ diarrhea no constipation or melena, + indigestion GU:no hematuria, no dysuria MS:no joint pain, no claudication Neuro:no syncope, no lightheadedness Endo:no diabetes, no thyroid disease  Wt Readings from Last 3 Encounters:  04/13/17 (!) 335 lb 12.8 oz (152.3 kg)  04/07/17 (!) 335 lb 4 oz (152.1  kg)  01/05/17 (!) 334 lb (151.5 kg)     PHYSICAL EXAM: VS:  BP 116/73   Pulse 70   Ht 5\' 11"  (1.803 m)   Wt (!) 335 lb 12.8 oz (152.3 kg)   BMI 46.83 kg/m   , BMI Body mass index is 46.83 kg/m. General:Pleasant affect but flat, NAD Skin:Warm and dry, brisk capillary refill HEENT:normocephalic, sclera clear, mucus membranes moist Neck:supple, no JVD, no bruits  Heart:S1S2 RRR without murmur, gallup, rub or click Lungs:clear without rales, rhonchi, or wheezes EKC:MKLKJ, soft, non tender, + BS, do not palpate liver spleen or masses Ext:no lower ext edema, 2+ pedal pulses, 2+ radial pulses Neuro:alert and oriented X 3, MAE, follows commands, + facial symmetry    EKG:  EKG is NOT ordered today.  Recent Labs: 11/26/2016: Hemoglobin 13.5 12/29/2016: ALT 83; TSH 2.410 04/07/2017: BUN 19; Creatinine, Ser 1.49; NT-Pro BNP 32; Platelets 83; Potassium 4.4; Sodium 139    Lipid Panel    Component Value Date/Time   CHOL 145 11/26/2016 0912   TRIG 92 11/26/2016 0912   HDL 53 11/26/2016 0912   CHOLHDL 2.7 11/26/2016 0912   VLDL 18 11/26/2016 0912   LDLCALC 74 11/26/2016 0912       Other studies Reviewed: Additional studies/ records that were reviewed today include: . NUC study 04/10/17    Study Highlights    Nuclear stress EF: 52%. Septal hypokinesis  Inferior defect consistent with probable soft tissue attenuation (diaphragm, bowel activity) Apical thinning No signif ischemia or scar  Overall low risk scan      Echo 03/2016 Study Conclusions  - Procedure narrative: Transthoracic echocardiography. Image   quality was suboptimal. The study was technically difficult.   Intravenous contrast (Definity) was administered. - Left ventricle: The cavity size was normal. Wall thickness was   normal. Systolic function was mildly reduced. The estimated   ejection fraction was in the range of 45% to 50%. Features are   consistent with a pseudonormal left ventricular filling pattern,   with concomitant abnormal relaxation and increased filling   pressure (grade 2 diastolic dysfunction).  Impressions:  - The echo is extremely difficult. The views of the LV with   Definity contrast shows that the LV function is likely normal.   Specific wall motin information is not able to be determined by   this echo.   No specific valvular information can be stated on this echo   ASSESSMENT AND PLAN:  1.  chest pain continues wit the imdur and he does have a headache.  nuc is neg for ischemia but I discussed with Dr. 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  Discussed with pt and he is agreeable.  If stable CAD would stop imdur.  Last cath they had to go through groin  The patient understands that risks included but are not limited to stroke (1 in 1000), death (1 in 1000), kidney failure [usually temporary] (1 in 500), bleeding (1 in 200), allergic reaction [possibly serious] (1 in 200).     2.  GERD this has increased for now stop prilosec and begin protonix 40 mg BID for 1 week then 40 mg daily.     3.  CAD with hx of off pump CABG LIMA to LAD.  He has had some chronic chest pain since surgery but now this is different. On BB and  statin  4. Anterior rotation of the cardiac apex, no obvious  dextrocardia or other anomalies such as situs inversus  On cardiac CT due to the severity of rotation   5. PAF on Eliquis none tonight and none until cath. On Wed.  Maintaining SR   6.  Hyperlipidemia on crestor in Dec LDL was 74  7.  thrombocytopenic, 83,000 on last check.    8. Diarrhea, 3 stools per day.  Will try imodium.     Current medicines are reviewed with the patient today.  The patient Has no concerns regarding medicines.  The following changes have been made:  See above Labs/ tests ordered today include:see above  Disposition:   FU:  see above  Signed, Cecilie Kicks, NP  04/13/2017 4:23 PM    [facility], [place], [place], [place] Phone: [phone]; Fax: [phone]

## 2017-04-15 NOTE — Progress Notes (Signed)
Site area: Right groin a 5 french arterial sheath was removed  Site Prior to Removal:  Level 0  Pressure Applied For 30 minutes  Bedrest Beginning at 1030am  Manual:   Yes.    Patient Status During Pull:  stable  Post Pull Groin Site:  Level 0  Post Pull Instructions Given:  Yes.    Post Pull Pulses Present:  Yes.    Dressing Applied:  Yes.  Pressure dressing applied.  Comments:  VS remain stable during sheath pull

## 2017-04-16 NOTE — Telephone Encounter (Signed)
That's ok with me.

## 2017-04-16 NOTE — Telephone Encounter (Signed)
Informed the pt that Dr Meda Coffee is ok with him discontinuing Imdur for complaints mentioned.  Removed from the pts med list and updated in allergies as unspecified and causes pt diarrhea. Pt verbalize understanding.

## 2017-04-19 NOTE — Progress Notes (Signed)
Cardiology Office Note   Date:  04/20/2017   ID:  Sean [name], DOB 04-26-1971, MRN [id number]  PCP:  Raina Mina., MD  Cardiologist:  Dr. Meda Coffee    Chief Complaint  Patient presents with  . Hospitalization Follow-up    post cath      History of Present Illness: Sean [name] is a 46 y.o. male who presents for chest pain and post hospitalization for cath.  Pt with hx of CABG to LAD a year ago but now with recurrent chest pain.  nuc was neg but continued with pain.  Pt had cardiac cath with stable CAD.    Hx of a fib/flutter on Eliquis.  Hx of DCCV.  But is maintaining SR.    Today his pain is about gone, felt to be GI.  He is on protonix now.  He does not drink coffee or carbonated beverages or spicy foods.  Today he feels better.  No chest pain.  No SOB.  He wears his CPAP every night.  He takes his eliquis as instructed.  He does have bruising at cath site.    He may return to work on 04/28/17 after more healing at cath site.   Past Medical History:  Diagnosis Date  . Abnormal liver enzymes    a. Sees a doctor in [place].  . Cardiac cirrhosis    a. possible elevated LFTs/low platelets felt due to cardiac cirrhosis per 2017 admission.  . Chronic combined systolic and diastolic CHF (congestive heart failure) ([place])   . CKD (chronic kidney disease), stage II   . Congenital heart defect    a. rightward rotation of heart, almost dextrocardia  . Coronary artery disease    a. s/p CABGx1 in 12/2015.  Marland Kitchen Hematuria    a. Chronic hx of this, no prior etiology determined through workup per patient.  . Hypercholesteremia    a. Prev taken off statin due to abnormal liver function.  . Hypertension   . Morbid obesity ([place])   . Paroxysmal atrial flutter ([place]) 02/12/2016  . 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 Sinus bradycardia   . Thrombocytopenia ([place])     Past Surgical History:  Procedure Laterality Date  . APPENDECTOMY    . CARDIAC CATHETERIZATION N/A  12/24/2015   Procedure: Right/Left Heart Cath and Coronary Angiography;  Surgeon: Jolaine Artist, MD;  Location: [facility];  Service: Cardiovascular;  Laterality: N/A;  . CARDIOVERSION N/A 02/14/2016   Procedure: CARDIOVERSION;  Surgeon: Pixie Casino, MD;  Location[facility] ENDOSCOPY;  Service: Cardiovascular;  Laterality: N/A;  . CORONARY ARTERY BYPASS GRAFT N/A 12/26/2015   Procedure: Off Pump Coronary Artery Bypass Grafting times one using left internal mammary artery;  Surgeon: Rexene Alberts, MD;  Location: [place];  Service: Open Heart Surgery;  Laterality: N/A;  . GALLBLADDER SURGERY    . HERNIA REPAIR    . LEFT HEART CATH AND CORS/GRAFTS ANGIOGRAPHY N/A 04/15/2017   Procedure: Left Heart Cath and Cors/Grafts Angiography;  Surgeon: Troy Sine, MD;  Location: [place] [facility];  Service: Cardiovascular;  Laterality: N/A;  . TEE WITHOUT CARDIOVERSION N/A 12/26/2015   Procedure: TRANSESOPHAGEAL ECHOCARDIOGRAM (TEE);  Surgeon: Rexene Alberts, MD;  Location: [place];  Service: Open Heart Surgery;  Laterality: N/A;  . TEE WITHOUT CARDIOVERSION N/A 02/14/2016   Procedure: TRANSESOPHAGEAL ECHOCARDIOGRAM (TEE);  Surgeon: Pixie Casino, MD;  Location: [facility];  Service: Cardiovascular;  Laterality:  N/A;     Current Outpatient Prescriptions  Medication Sig Dispense Refill  . allopurinol (ZYLOPRIM) 100 MG tablet Take 1 tablet (100 mg total) by mouth daily. 30 tablet 1  . apixaban (ELIQUIS) 5 MG TABS tablet Take 1 tablet (5 mg total) by mouth 2 (two) times daily. 60 tablet 5  . aspirin 81 MG tablet Take 1 tablet (81 mg total) by mouth daily.    Marland Kitchen diltiazem (DILACOR XR) 120 MG 24 hr capsule Take 120 mg by mouth daily.    . furosemide (LASIX) 40 MG tablet Take 40 mg by mouth daily.    . metoprolol succinate (TOPROL-XL) 25 MG 24 hr tablet Take 25 mg by mouth daily.    . montelukast (SINGULAIR) 10 MG tablet Take 10 mg by mouth daily.    . nitroGLYCERIN (NITROSTAT) 0.4 MG SL tablet  Place 1 tablet (0.4 mg total) under the tongue every 5 (five) minutes as needed for chest pain. 25 tablet 12  . omeprazole (PRILOSEC) 20 MG capsule Take 20 mg by mouth daily.  1  . pantoprazole (PROTONIX) 40 MG tablet Take 1 tablet (40 mg total) by mouth daily. 37 tablet 6  . potassium chloride (K-DUR) 10 MEQ tablet Take 1 tablet (10 mEq total) by mouth daily. 90 tablet 3  . rosuvastatin (CRESTOR) 20 MG tablet Take 20 mg by mouth daily.     No current facility-administered medications for this visit.     Allergies:   Imdur [isosorbide dinitrate]; Augmentin [amoxicillin-pot clavulanate]; and Tape    Social History:  The patient  reports that he has never smoked. He has never used smokeless tobacco. He reports that he drinks alcohol. He reports that he does not use drugs.   Family History:  The patient's family history includes CAD in his father.    ROS:  General:no colds or fevers, no weight changes Skin:no rashes or ulcers HEENT:no blurred vision, no congestion CV:see HPI PUL:see HPI GI:no diarrhea constipation or melena, no indigestion GU:no hematuria, no dysuria MS:no joint pain, no claudication Neuro:no syncope, no lightheadedness Endo:no diabetes, no thyroid disease  Wt Readings from Last 3 Encounters:  04/20/17 (!) 335 lb (152 kg)  04/15/17 (!) 335 lb (152 kg)  04/13/17 (!) 335 lb 12.8 oz (152.3 kg)     PHYSICAL EXAM: VS:  BP 114/76   Pulse 82   Ht 5\' 11"  (1.803 m)   Wt (!) 335 lb (152 kg)   BMI 46.72 kg/m  , BMI Body mass index is 46.72 kg/m. General:Pleasant affect, NAD Skin:Warm and dry, brisk capillary refill HEENT:normocephalic, sclera clear, mucus membranes moist Neck:supple, no JVD, no bruits  Heart:S1S2 RRR without murmur, gallup, rub or click Lungs:clear without rales, rhonchi, or wheezes Abd: obese,soft, non tender, + BS, do not palpate liver spleen or masses, + epigastric tenderness to palpation   Ext:no lower ext edema, 2+ pedal pulses, 2+ radial  pulses, rt groin with ecchymosis but no hematoma.  Thigh is soft the bruising is going below the knee.  No bruit noted in rt femoral artery. Neuro:alert and oriented X 3, MAE, follows commands, + facial symmetry    EKG:  EKG is NOT ordered today.   Recent Labs: 11/26/2016: Hemoglobin 13.5 12/29/2016: ALT 83; TSH 2.410 04/07/2017: BUN 19; Creatinine, Ser 1.49; NT-Pro BNP 32; Potassium 4.4; Sodium 139 04/13/2017: Platelets 98    Lipid Panel    Component Value Date/Time   CHOL 145 11/26/2016 0912   TRIG 92 11/26/2016 0912   HDL  53 11/26/2016 0912   CHOLHDL 2.7 11/26/2016 0912   VLDL 18 11/26/2016 0912   LDLCALC 74 11/26/2016 0912       Other studies Reviewed: Additional studies/ records that were reviewed today include: .  Procedures  04/15/17  Left Heart Cath and Cors/Grafts Angiography  Conclusion     Ost LAD lesion, 100 %stenosed.  Prox RCA lesion, 15 %stenosed.  Mid LAD lesion, 25 %stenosed.  LIMA and is normal in caliber and anatomically normal.  The left ventricular ejection fraction is 50-55% by visual estimate.  The left ventricular systolic function is normal.   Normal LV function with an ejection fraction of 50-55% without definitive focal segmental wall motion abnormalities.  Significant native CAD with total occlusion of the LAD at the ostium; normal codominant left circumflex vessel, and RCA with smooth 10% luminal narrowing of the proximal vessel.  Patent LIMA graft supplying the LAD.  There is a mild 25% smooth narrowing in the LAD beyond the anastomosis.  RECOMMENDATION: Medical therapy.     NUC STudy  04/10/17 5\' 11"  (1.803 m) 335 lb (152 kg) 46.8  Study Highlights    Nuclear stress EF: 52%. Septal hypokinesis  Inferior defect consistent with probable soft tissue attenuation (diaphragm, bowel activity) Apical thinning No signif ischemia or scar  Overall low risk scan      ASSESSMENT AND PLAN:  1.  Chest pain has resolved.   protonix is helping. Some epigastric tenderness to palpation.  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  He has mild native CAD except occluded LAD which is bypassed with LIMA graft which is patent.  Pt is cleared to go back to work 04/28/17 after healing of cath site.   He will follow up with Dr. Meda Coffee in 4 months.   2. CAD s/p CABG with stable CAD and patent graft.  3. GERD most likely cause of chest pain  4.  PAF maintaining SR on Eliquis.   5. HLD on crestor with LDL 74 in Dec. Continue statin  6. Thrombocytopenia hx of --last plts 83,000 stable.  7. OSA with CPAP uses every night.    Current medicines are reviewed with the patient today.  The patient Has no concerns regarding medicines.  The following changes have been made:  See above Labs/ tests ordered today include:see above  Disposition:   FU:  see above  Signed, Cecilie Kicks, NP  04/20/2017 11:57 AM    [facility], [place], [place], [place] Phone: [phone]; Fax: [phone]

## 2017-04-20 ENCOUNTER — Ambulatory Visit (INDEPENDENT_AMBULATORY_CARE_PROVIDER_SITE_OTHER): Payer: BLUE CROSS/BLUE SHIELD | Admitting: Cardiology

## 2017-04-20 ENCOUNTER — Ambulatory Visit: Payer: BLUE CROSS/BLUE SHIELD | Admitting: Thoracic Surgery (Cardiothoracic Vascular Surgery)

## 2017-04-20 ENCOUNTER — Encounter: Payer: Self-pay | Admitting: Cardiology

## 2017-04-20 VITALS — BP 114/76 | HR 82 | Ht 71.0 in | Wt 335.0 lb

## 2017-04-20 DIAGNOSIS — I10 Essential (primary) hypertension: Secondary | ICD-10-CM

## 2017-04-20 DIAGNOSIS — G4733 Obstructive sleep apnea (adult) (pediatric): Secondary | ICD-10-CM

## 2017-04-20 DIAGNOSIS — I2510 Atherosclerotic heart disease of native coronary artery without angina pectoris: Secondary | ICD-10-CM | POA: Diagnosis not present

## 2017-04-20 DIAGNOSIS — I4892 Unspecified atrial flutter: Secondary | ICD-10-CM

## 2017-04-20 DIAGNOSIS — R079 Chest pain, unspecified: Secondary | ICD-10-CM

## 2017-04-20 DIAGNOSIS — Z951 Presence of aortocoronary bypass graft: Secondary | ICD-10-CM | POA: Diagnosis not present

## 2017-04-20 DIAGNOSIS — E782 Mixed hyperlipidemia: Secondary | ICD-10-CM | POA: Diagnosis not present

## 2017-04-20 NOTE — Patient Instructions (Signed)
Medication Instructions:  The current medical regimen is effective;  continue present plan and medications.  Follow-Up: Follow up in 4 months with Dr Meda Coffee.  If you need a refill on your cardiac medications before your next appointment, please call your pharmacy.  Thank you for choosing [facility]!!

## 2017-05-11 ENCOUNTER — Telehealth: Payer: Self-pay | Admitting: Cardiology

## 2017-05-11 DIAGNOSIS — T81718A Complication of other artery following a procedure, not elsewhere classified, initial encounter: Principal | ICD-10-CM | POA: Insufficient documentation

## 2017-05-11 DIAGNOSIS — I729 Aneurysm of unspecified site: Secondary | ICD-10-CM | POA: Insufficient documentation

## 2017-05-11 NOTE — Telephone Encounter (Signed)
Pt calling to inform Dr Meda Coffee that he noted a knot on his right femoral/groin area, over the weekend. Pt states that he had a cath done on 5/16, through femoral access.   Pt has had no issues with this area, until last Friday, he went to the gym for the first time, got on the treadmill for 20 mins, then the stationary bike for 15 mins, and then lifted light weights after that.  Pt states that next morning, he noted a knot in the same place where his cath was accessed.  Pt states that he has a knot in the right groin area about "1/2 the size of a male fist."  Pt reports that its "mildly swollen in that area." Pt reports that its only "very light bruising, but there is no appearance of blood collection in that area, and its not dark black bruising." Pt reports that he can move that extremity without any difficulty.  Pt reports that his right extremity is normal temperature to touch, with normal circulation and color to that extremity.  Pt reports he has no other symptoms.  No chest pain and no SOB.  Pt reports that there is no swelling below that site.  Pt reports that it is a bit sore in that area.  Pt states he is holding off on the gym thing, but wanted Dr Meda Coffee to know this and advise on what he should do.  Pt states he can come in for an [place] if needed, but not until later tomorrow or Wed, for he is working as a Administrator.  Informed the pt that Dr Meda Coffee is out of the office today, but I will route this message to her for further review and recommendation, and follow-up with the pt accordingly thereafter.  Pt verbalized understanding and agrees with this plan.  Advised the pt that in the meantime, if he notices more swelling, bruising, sob, chest pain, discoloration in that extremity, and not being able to move that extremity anymore, then he should refer to the closest ER. Pt agreed to this plan.

## 2017-05-11 NOTE — Telephone Encounter (Signed)
New message   Pt mother is calling for pt. She states per pt the place on his leg where he had the cath is more bruised and has been hurting.

## 2017-05-11 NOTE — Telephone Encounter (Signed)
Notified the pt that per Dr Meda Coffee, we will order for him to have a right groin arterial US (groin pseudoaneurysm study) to rule out pseudoaneurysm or a fistula.  Informed the pt that order is placed and his appt is scheduled for this Thursday 05/14/17 at 1000, and he must arrive a 0945.  Pt is aware that this is at our [facility] office.  Pt aware of location and time.  Pt verbalized understanding and agrees with this plan.

## 2017-05-11 NOTE — Telephone Encounter (Signed)
Please schedule right groin arterial US - to rule out pseudoaneurysm or a fistula. Thank you.

## 2017-05-14 ENCOUNTER — Encounter (HOSPITAL_COMMUNITY): Payer: Self-pay

## 2017-05-14 ENCOUNTER — Telehealth: Payer: Self-pay | Admitting: *Deleted

## 2017-05-14 ENCOUNTER — Ambulatory Visit (HOSPITAL_COMMUNITY)
Admission: RE | Admit: 2017-05-14 | Discharge: 2017-05-14 | Disposition: A | Discharge status: Home / Self Care | Payer: BLUE CROSS/BLUE SHIELD | Source: Ambulatory Visit | Attending: Cardiology | Admitting: Cardiology

## 2017-05-14 ENCOUNTER — Other Ambulatory Visit: Payer: Self-pay | Admitting: *Deleted

## 2017-05-14 ENCOUNTER — Emergency Department (HOSPITAL_COMMUNITY)
Admission: EM | Admit: 2017-05-14 | Discharge: 2017-05-14 | Disposition: A | Discharge status: Home / Self Care | Payer: BLUE CROSS/BLUE SHIELD | Attending: Emergency Medicine | Admitting: Emergency Medicine

## 2017-05-14 DIAGNOSIS — I5042 Chronic combined systolic (congestive) and diastolic (congestive) heart failure: Secondary | ICD-10-CM | POA: Insufficient documentation

## 2017-05-14 DIAGNOSIS — I724 Aneurysm of artery of lower extremity: Secondary | ICD-10-CM | POA: Diagnosis not present

## 2017-05-14 DIAGNOSIS — I9789 Other postprocedural complications and disorders of the circulatory system, not elsewhere classified: Secondary | ICD-10-CM

## 2017-05-14 DIAGNOSIS — I130 Hypertensive heart and chronic kidney disease with heart failure and stage 1 through stage 4 chronic kidney disease, or unspecified chronic kidney disease: Secondary | ICD-10-CM | POA: Diagnosis not present

## 2017-05-14 DIAGNOSIS — D696 Thrombocytopenia, unspecified: Secondary | ICD-10-CM | POA: Insufficient documentation

## 2017-05-14 DIAGNOSIS — I2510 Atherosclerotic heart disease of native coronary artery without angina pectoris: Secondary | ICD-10-CM | POA: Insufficient documentation

## 2017-05-14 DIAGNOSIS — I10 Essential (primary) hypertension: Secondary | ICD-10-CM

## 2017-05-14 DIAGNOSIS — N183 Chronic kidney disease, stage 3 (moderate): Secondary | ICD-10-CM | POA: Diagnosis not present

## 2017-05-14 DIAGNOSIS — I729 Aneurysm of unspecified site: Secondary | ICD-10-CM

## 2017-05-14 DIAGNOSIS — E7800 Pure hypercholesterolemia, unspecified: Secondary | ICD-10-CM | POA: Diagnosis not present

## 2017-05-14 DIAGNOSIS — I480 Paroxysmal atrial fibrillation: Secondary | ICD-10-CM | POA: Diagnosis not present

## 2017-05-14 DIAGNOSIS — I4892 Unspecified atrial flutter: Secondary | ICD-10-CM | POA: Insufficient documentation

## 2017-05-14 DIAGNOSIS — Z7982 Long term (current) use of aspirin: Secondary | ICD-10-CM | POA: Diagnosis not present

## 2017-05-14 DIAGNOSIS — Z951 Presence of aortocoronary bypass graft: Secondary | ICD-10-CM

## 2017-05-14 DIAGNOSIS — E785 Hyperlipidemia, unspecified: Secondary | ICD-10-CM

## 2017-05-14 DIAGNOSIS — M79604 Pain in right leg: Secondary | ICD-10-CM | POA: Insufficient documentation

## 2017-05-14 DIAGNOSIS — T81718A Complication of other artery following a procedure, not elsewhere classified, initial encounter: Principal | ICD-10-CM

## 2017-05-14 DIAGNOSIS — Z79899 Other long term (current) drug therapy: Secondary | ICD-10-CM | POA: Insufficient documentation

## 2017-05-14 LAB — BASIC METABOLIC PANEL
Anion gap: 6 (ref 5–15)
BUN: 15 mg/dL (ref 6–20)
CALCIUM: 8.9 mg/dL (ref 8.9–10.3)
CO2: 26 mmol/L (ref 22–32)
CREATININE: 1.43 mg/dL — AB (ref 0.61–1.24)
Chloride: 107 mmol/L (ref 101–111)
GFR calc Af Amer: >60 mL/min (ref >60)
GFR calc non Af Amer: 58 mL/min — ABNORMAL LOW (ref >60)
GLUCOSE: 107 mg/dL — AB (ref 65–99)
Potassium: 4 mmol/L (ref 3.5–5.1)
Sodium: 139 mmol/L (ref 135–145)

## 2017-05-14 LAB — CBC WITH DIFFERENTIAL/PLATELET
BASOS PCT: 0 %
Basophils Absolute: 0 10*3/uL (ref 0.0–0.1)
Eosinophils Absolute: 0.6 10*3/uL (ref 0.0–0.7)
Eosinophils Relative: 8 %
HEMATOCRIT: 40.5 % (ref 39.0–52.0)
Hemoglobin: 12.6 g/dL — ABNORMAL LOW (ref 13.0–17.0)
Lymphocytes Relative: 17 %
Lymphs Abs: 1.3 10*3/uL (ref 0.7–4.0)
MCH: 28.8 pg (ref 26.0–34.0)
MCHC: 31.1 g/dL (ref 30.0–36.0)
MCV: 92.5 fL (ref 78.0–100.0)
MONO ABS: 0.9 10*3/uL (ref 0.1–1.0)
MONOS PCT: 12 %
NEUTROS ABS: 4.6 10*3/uL (ref 1.7–7.7)
Neutrophils Relative %: 63 %
Platelets: 85 10*3/uL — ABNORMAL LOW (ref 150–400)
RBC: 4.38 MIL/uL (ref 4.22–5.81)
RDW: 15.7 % — AB (ref 11.5–15.5)
WBC: 7.4 10*3/uL (ref 4.0–10.5)

## 2017-05-14 LAB — PROTIME-INR
INR: 1
Prothrombin Time: 13.2 seconds (ref 11.4–15.2)

## 2017-05-14 LAB — APTT: aPTT: 33 seconds (ref 24–36)

## 2017-05-14 MED ORDER — HYDROCODONE-ACETAMINOPHEN 5-325 MG PO TABS: 2.0000 | ORAL_TABLET | ORAL | 0 refills | Status: DC | PRN

## 2017-05-14 MED ORDER — ONDANSETRON HCL 4 MG/2ML IJ SOLN
4.0000 mg | Freq: Once | INTRAMUSCULAR | Status: AC
  Administered 2017-05-14: 4 mg via INTRAVENOUS
  Filled 2017-05-14: qty 2

## 2017-05-14 MED ORDER — SODIUM CHLORIDE 0.9 % IV BOLUS (SEPSIS): 1000.0000 mL | Freq: Once | INTRAVENOUS | Status: DC

## 2017-05-14 MED ORDER — MORPHINE SULFATE (PF) 4 MG/ML IV SOLN
4.0000 mg | Freq: Once | INTRAVENOUS | Status: AC
  Administered 2017-05-14: 4 mg via INTRAVENOUS
  Filled 2017-05-14: qty 1

## 2017-05-14 NOTE — ED Provider Notes (Signed)
[facility] DEPT Provider Note   CSN: [id number] Arrival date & time: 05/14/17  1101     History   Chief Complaint Chief Complaint  Patient presents with  . leg pain/post cath    HPI Sean [name] is a 46 y.o. male who presents with persistent right groin and leg pain that has been ongoing since 04/11/19 after a cardiac cath. Patient notes that he initially noted a small knot to his right cornea continued to get bigger and more painful. He reports the pain worsened after being at the gym a few weeks ago. He contacted his cardiologist office to arrange for an outpatient ultrasound of the right femoral. Patient's cardiologist advised him to come to the emergency department for further evaluation. Patient states that he has been taking Tylenol for pain relief with no improvement. Patient is on [place] 5 mg twice a day which he states he has been compliant with. Patient reports increased bruising and swelling to the proximal right leg. He denies any redness/swelling of the lower calf region. He denies any shortness of breath. He has still been able to ambulate and use the extremity without difficulty. Patient denies any difficulty breathing, chest pain, fevers, numbness/weakness or any other concerns.  Cardiology: Ena Dawley  The history is provided by the patient.    Past Medical History:  Diagnosis Date  . Abnormal liver enzymes    a. Sees a doctor in [place].  . Cardiac cirrhosis    a. possible elevated LFTs/low platelets felt due to cardiac cirrhosis per 2017 admission.  . Chronic combined systolic and diastolic CHF (congestive heart failure) ([place])   . CKD (chronic kidney disease), stage II   . Congenital heart defect    a. rightward rotation of heart, almost dextrocardia  . Coronary artery disease    a. s/p CABGx1 in 12/2015.  Marland Kitchen Hematuria    a. Chronic hx of this, no prior etiology determined through workup per patient.  . Hypercholesteremia    a. Prev taken off  statin due to abnormal liver function.  . Hypertension   . Morbid obesity ([place])   . Paroxysmal atrial flutter ([place]) 02/12/2016  . 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 Sinus bradycardia   . Thrombocytopenia [facility])     Patient Active Problem List   Diagnosis Date Noted  . Pseudoaneurysm following procedure ([place]) 05/11/2017  . Elevated TSH 11/27/2016  . Elevated BUN 11/27/2016  . Elevated serum creatinine 11/27/2016  . Bronchitis 05/06/2016  . Abnormal EKG 04/07/2016  . Acute sinusitis 04/07/2016  . Allergy to penicillin 04/07/2016  . Atrial flutter ([place]) 04/07/2016  . Coronary artery disease involving native coronary artery of native heart without angina pectoris 04/07/2016  . Morbid obesity ([place])   . Other long term (current) drug therapy 02/19/2016  . Obstructive apnea 02/19/2016  . Atrial flutter with rapid ventricular response ([place]) 02/12/2016  . Arteriosclerosis of coronary artery 01/04/2016  . Chronic kidney disease (CKD), stage III (moderate) 01/04/2016  . Gouty arthritis of toe 01/04/2016  . Morbid (severe) obesity due to excess calories ([place]) 01/04/2016  . 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 H/O coronary artery bypass surgery 12/26/2015  . CAD (coronary artery disease)   . OSA (obstructive sleep apnea)   . Unstable angina ([place])   . Chest pain 12/23/2015  . Essential hypertension 12/23/2015  . Hyperlipidemia 12/23/2015  . Renal insufficiency 12/23/2015  . Thrombocytopenia ([place]) 12/23/2015  . Hyperglycemia 12/23/2015  .  Sinus bradycardia 12/23/2015  . Disorder of function of stomach 06/16/2014  . Incisional hernia 07/10/2011    Past Surgical History:  Procedure Laterality Date  . APPENDECTOMY    . CARDIAC CATHETERIZATION N/A 12/24/2015   Procedure: Right/Left Heart Cath and Coronary Angiography;  Surgeon: Jolaine Artist, MD;  Location: [facility] CV LAB;  Service: Cardiovascular;  Laterality: N/A;  . CARDIOVERSION N/A 02/14/2016   Procedure:  CARDIOVERSION;  Surgeon: Pixie Casino, MD;  Location: [facility] ENDOSCOPY;  Service: Cardiovascular;  Laterality: N/A;  . CORONARY ARTERY BYPASS GRAFT N/A 12/26/2015   Procedure: Off Pump Coronary Artery Bypass Grafting times one using left internal mammary artery;  Surgeon: Rexene Alberts, MD;  Location: [facility];  Service: Open Heart Surgery;  Laterality: N/A;  . GALLBLADDER SURGERY    . HERNIA REPAIR    . LEFT HEART CATH AND CORS/GRAFTS ANGIOGRAPHY N/A 04/15/2017   Procedure: Left Heart Cath and Cors/Grafts Angiography;  Surgeon: Troy Sine, MD;  Location: [place] CV LAB;  Service: Cardiovascular;  Laterality: N/A;  . TEE WITHOUT CARDIOVERSION N/A 12/26/2015   Procedure: TRANSESOPHAGEAL ECHOCARDIOGRAM (TEE);  Surgeon: Rexene Alberts, MD;  Location: [place];  Service: Open Heart Surgery;  Laterality: N/A;  . TEE WITHOUT CARDIOVERSION N/A 02/14/2016   Procedure: TRANSESOPHAGEAL ECHOCARDIOGRAM (TEE);  Surgeon: Pixie Casino, MD;  Location: [facility] ENDOSCOPY;  Service: Cardiovascular;  Laterality: N/A;       Home Medications    Prior to Admission medications   Medication Sig Start Date End Date Taking? Authorizing Provider  allopurinol (ZYLOPRIM) 100 MG tablet Take 1 tablet (100 mg total) by mouth daily. 02/15/16   Bhagat, Crista Luria, PA  apixaban (ELIQUIS) 5 MG TABS tablet Take 1 tablet (5 mg total) by mouth 2 (two) times daily. 12/09/16   Dorothy Spark, MD  aspirin 81 MG tablet Take 1 tablet (81 mg total) by mouth daily. 02/15/16   Bhagat, Crista Luria, PA  diltiazem (DILACOR XR) 120 MG 24 hr capsule Take 120 mg by mouth daily.    [provider]  furosemide (LASIX) 40 MG tablet Take 40 mg by mouth daily.    [provider]  HYDROcodone-acetaminophen (NORCO/VICODIN) 5-325 MG tablet Take 2 tablets by mouth every 4 (four) hours as needed. 05/14/17   Volanda Napoleon, PA-C  metoprolol succinate (TOPROL-XL) 25 MG 24 hr tablet Take 25 mg by mouth daily.    [provider]   montelukast (SINGULAIR) 10 MG tablet Take 10 mg by mouth daily.    [provider]  nitroGLYCERIN (NITROSTAT) 0.4 MG SL tablet Place 1 tablet (0.4 mg total) under the tongue every 5 (five) minutes as needed for chest pain. 02/15/16   Bhagat, Crista Luria, PA  omeprazole (PRILOSEC) 20 MG capsule Take 20 mg by mouth daily. 02/26/17   [provider]  pantoprazole (PROTONIX) 40 MG tablet Take 1 tablet (40 mg total) by mouth daily. 04/13/17   Isaiah Serge, NP  potassium chloride (K-DUR) 10 MEQ tablet Take 1 tablet (10 mEq total) by mouth daily. 05/27/16   Dorothy Spark, MD  rosuvastatin (CRESTOR) 20 MG tablet Take 20 mg by mouth daily.    [provider]    Family History Family History  Problem Relation Age of Onset  . CAD Father        2 stents ~ 65  . Heart attack Unknown   . Hyperlipidemia Unknown     Social History Social History  Substance Use Topics  .  Smoking status: Never Smoker  . Smokeless tobacco: Never Used  . Alcohol use Yes     Allergies   Imdur [isosorbide dinitrate]; Augmentin [amoxicillin-pot clavulanate]; and Tape   Review of Systems Review of Systems  Constitutional: Negative for fever.  Respiratory: Negative for shortness of breath.   Cardiovascular: Positive for leg swelling (Right upper thigh/groin). Negative for chest pain.  Genitourinary: Negative for dysuria, hematuria and testicular pain.  Skin: Positive for color change (Ecchymosis to right inner thigh).     Physical Exam Updated Vital Signs BP (!) 121/59   Pulse 85   Temp 98.1 F (36.7 C) (Oral)   Resp 18   Ht 5\' 11"  (1.803 m)   Wt (!) 152 kg (335 lb)   SpO2 99%   BMI 46.72 kg/m   Physical Exam  Constitutional: He is oriented to person, place, and time. He appears well-developed and well-nourished.  Sitting comfortably on examination table  HENT:  Head: Normocephalic and atraumatic.  Mouth/Throat: Oropharynx is clear and moist and mucous membranes are  normal.  Eyes: Conjunctivae, EOM and lids are normal. Pupils are equal, round, and reactive to light.  Neck: Full passive range of motion without pain.  Cardiovascular: Normal rate, regular rhythm, normal heart sounds and normal pulses.  Exam reveals no gallop and no friction rub.   No murmur heard. Pulses:      Dorsalis pedis pulses are 2+ on the right side, and 2+ on the left side.  Pulmonary/Chest: Effort normal and breath sounds normal.  No evidence of respiratory distress. Able to speak in full sentences without difficulty.  Abdominal: Soft. Normal appearance. There is no tenderness. There is no rigidity and no guarding.  Genitourinary:  Genitourinary Comments: No evidence of inguinal hernia bilaterally.   Musculoskeletal: Normal range of motion.  Right groin with a hard, palpable masses with ecchymosis that extends distally down the right lower extremity. No edema of the calves bilaterally with no overlying erythema or warmth.   Neurological: He is alert and oriented to person, place, and time.  Skin: Skin is warm and dry. Capillary refill takes less than 2 seconds.  Large well-healed sternotomy scar. Large well-healed surgical scar to the right upper quadrant abdomen with a easily reducible incisional hernia. Lower extremity is without dusky appearance and is not cold.  Psychiatric: He has a normal mood and affect. His speech is normal.  Nursing note and vitals reviewed.    ED Treatments / Results  Labs (all labs ordered are listed, but only abnormal results are displayed) Labs Reviewed  CBC WITH DIFFERENTIAL/PLATELET - Abnormal; Notable for the following:       Result Value   Hemoglobin 12.6 (*)    RDW 15.7 (*)    Platelets 85 (*)    All other components within normal limits  BASIC METABOLIC PANEL - Abnormal; Notable for the following:    Glucose, Bld 107 (*)    Creatinine, Ser 1.43 (*)    GFR calc non Af Amer 58 (*)    All other components within normal limits    PROTIME-INR  APTT    EKG  EKG Interpretation None       Radiology No results found.  Procedures Procedures (including critical care time)  Medications Ordered in ED Medications  ondansetron (ZOFRAN) injection 4 mg (4 mg Intravenous Given 05/14/17 1357)  morphine 4 MG/ML injection 4 mg (4 mg Intravenous Given 05/14/17 1359)     Initial Impression / Assessment and Plan / ED Course  I have reviewed the triage vital signs and the nursing notes.  Pertinent labs & imaging results that were available during my care of the patient were reviewed by me and considered in my medical decision making (see chart for details).     47 year old male who is status post cath on 04/10/17 presents with right groin pain associated with an ultrasound confirmed a pseudoaneurysm in the right femoral. Patient is neurovascularly intact with good distal pulses. Per review of records, patient had an ultrasound of the right groin done this morning, which showed a pseudoaneurysm that is partially thrombosed. Patient was prompted to come directly to the emergency department for further evaluation. There is no formal results of the ultrasound noted in the EMR patient did not arrive with any paper records. We'll plan to obtain medical records to evaluate for pseudoaneurysm. Vascular consult placed. Initial admission labs ordered. Old G6 given in the department.  Discussed with Dr. Kasandra Knudsen (vascular). Will plan to evaluate patient in the emergency department.  Discussed with Dr. Kasandra Knudsen. He will likely not admit today since patient is on elliquis. He would like to be reconsulted after the records are faxed) he has definitive worsening of the size and location of the pseudoaneurysm.  Dr. Jeneen Rinks discussed with the vascular PA after receiving results of the ultrasound. Will plan to discharge patient at this time. Will plan to see him in the office on 05/20/17. He would like patient to stop taking his elliquis on Sunday prior  to coming in. This has been discussed with patient. Patient is stable for discharge at this time. Will send home with a short course of pain medication for symptomatic treatment. Strict return precautions discussed. Patient expresses understanding and agreement to plan.   Final Clinical Impressions(s) / ED Diagnoses   Final diagnoses:  Right leg pain    New Prescriptions Discharge Medication List as of 05/14/2017  3:02 PM    START taking these medications   Details  HYDROcodone-acetaminophen (NORCO/VICODIN) 5-325 MG tablet Take 2 tablets by mouth every 4 (four) hours as needed., Starting Thu 05/14/2017, Print         Volanda Napoleon, PA-C 05/14/17 1654    Tanna Furry, MD 05/18/17 [phone]

## 2017-05-14 NOTE — ED Triage Notes (Signed)
Patient complains of ongoing right groin and leg pain since having cardiac cath last Saturday, here for further eval

## 2017-05-14 NOTE — Progress Notes (Signed)
Today's right groin ultrasound is positive for partially thrombosed pseudoaneurysm. The pseudo measures 3.9 cm x 5 cm and the patent portion measures 2.7 cm x 2.5 cm. The neck measures 0.7 cm. Preliminary results given to Dr. Meda Coffee. Patient went to [facility] Emergency Department.

## 2017-05-14 NOTE — ED Provider Notes (Signed)
Pt seen and evaluated. D/W PA-C. Pt seen by Vascular Surgery as well. Pt with leg pain s/p Heart Cath. Pain and tenderness with ecymosis to groin. Per U/S done prior to arrival, + pseudoaneurysm. Per Vascular, pt to DC. Stop Eliquis on Sunday for re-eval on office Wednesday.    Tanna Furry, MD 05/14/17 1500

## 2017-05-14 NOTE — Telephone Encounter (Signed)
Katharine Look, Vascular Tech at our [facility] office called and spoke with Dr. Meda Coffee about this pts Groin Pseudo of right groin.   Dr Meda Coffee ordered this on the this week, when he called in with complaints of a knot found in that area.  Pt had a cath done a month ago, with right femoral access. Per Katharine Look, she endorsed to Dr Meda Coffee that the pt is currently still in the office and they noted on his study that he has a pseudoaneurysm of the right groin and its partially thrombosed.   Per Dr Meda Coffee, she ordered for Katharine Look to send this pt directly to [facility] ER for further eval and treatment for this.  Dr Meda Coffee spoke with cardmaster, about pt coming to the ER for abnormal PV study.  Katharine Look verbalized understanding and agreed to this plan.  Pt to go to [facility] ER now.

## 2017-05-14 NOTE — Discharge Instructions (Signed)
Follow-up with Dr. Claretha Cooper office on Wednesday.   As discussed with Dr. Donzetta Matters, stop taking your eliquis on Sunday.  Take pain medications as directed for pain.  Return the emergency Department for any worsening pain, numbness/weakness of the right lower leg, worsening swelling or any other worsening or concerning symptoms.

## 2017-05-14 NOTE — ED Notes (Signed)
Patient Alert and oriented X4. Stable and ambulatory. Patient verbalized understanding of the discharge instructions.  Patient belongings were taken by the patient.  

## 2017-05-14 NOTE — Consult Note (Signed)
Hospital Consult    Reason for Consult:  Right femoral psa Requesting Physician:  ED MRN #:  [id number]  History of Present Illness: This is a 46 y.o. male with a history of a coronary artery bypass graft and recently underwent evaluation with coronary angiography with patent bypass. Post procedure he did well and was discharged home. A few days later he was at the gym and noted some pain in his right groin. He is subsequently had increased swelling of the right thigh as well as significant bruising that is now tracking on the posterior thigh. He is not having any associated pain in his right foot or leg. He does not have associated leg swelling. He has been taking Tylenol to relieve the pain. He is currently on Elliquis for anticoagulation for paroxysmal atrial fibrillation. Last dose was this morning.  Past Medical History:  Diagnosis Date  . Abnormal liver enzymes    a. Sees a doctor in [place].  . Cardiac cirrhosis    a. possible elevated LFTs/low platelets felt due to cardiac cirrhosis per 2017 admission.  . Chronic combined systolic and diastolic CHF (congestive heart failure) ([place])   . CKD (chronic kidney disease), stage II   . Congenital heart defect    a. rightward rotation of heart, almost dextrocardia  . Coronary artery disease    a. s/p CABGx1 in 12/2015.  Marland Kitchen Hematuria    a. Chronic hx of this, no prior etiology determined through workup per patient.  . Hypercholesteremia    a. Prev taken off statin due to abnormal liver function.  . Hypertension   . Morbid obesity ([place])   . Paroxysmal atrial flutter ([place]) 02/12/2016  . 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 Sinus bradycardia   . Thrombocytopenia ([place])     Past Surgical History:  Procedure Laterality Date  . APPENDECTOMY    . CARDIAC CATHETERIZATION N/A 12/24/2015   Procedure: Right/Left Heart Cath and Coronary Angiography;  Surgeon: Jolaine Artist, MD;  Location: [place] CV LAB;  Service:  Cardiovascular;  Laterality: N/A;  . CARDIOVERSION N/A 02/14/2016   Procedure: CARDIOVERSION;  Surgeon: Pixie Casino, MD;  Location: [facility] ENDOSCOPY;  Service: Cardiovascular;  Laterality: N/A;  . CORONARY ARTERY BYPASS GRAFT N/A 12/26/2015   Procedure: Off Pump Coronary Artery Bypass Grafting times one using left internal mammary artery;  Surgeon: Rexene Alberts, MD;  Location: [place];  Service: Open Heart Surgery;  Laterality: N/A;  . GALLBLADDER SURGERY    . HERNIA REPAIR    . LEFT HEART CATH AND CORS/GRAFTS ANGIOGRAPHY N/A 04/15/2017   Procedure: Left Heart Cath and Cors/Grafts Angiography;  Surgeon: Troy Sine, MD;  Location: [place] CV LAB;  Service: Cardiovascular;  Laterality: N/A;  . TEE WITHOUT CARDIOVERSION N/A 12/26/2015   Procedure: TRANSESOPHAGEAL ECHOCARDIOGRAM (TEE);  Surgeon: Rexene Alberts, MD;  Location: [place];  Service: Open Heart Surgery;  Laterality: N/A;  . TEE WITHOUT CARDIOVERSION N/A 02/14/2016   Procedure: TRANSESOPHAGEAL ECHOCARDIOGRAM (TEE);  Surgeon: Pixie Casino, MD;  Location: [facility] ENDOSCOPY;  Service: Cardiovascular;  Laterality: N/A;    Allergies  Allergen Reactions  . Imdur [Isosorbide Dinitrate] Diarrhea    Pt states causes "diarrhea."  . Augmentin [Amoxicillin-Pot Clavulanate] Itching  . Tape Rash    Prior to Admission medications   Medication Sig Start Date End Date Taking? Authorizing Provider  allopurinol (ZYLOPRIM) 100 MG tablet Take 1 tablet (100 mg total) by mouth daily. 02/15/16  Bhagat, Bhavinkumar, PA  apixaban (ELIQUIS) 5 MG TABS tablet Take 1 tablet (5 mg total) by mouth 2 (two) times daily. 12/09/16   Dorothy Spark, MD  aspirin 81 MG tablet Take 1 tablet (81 mg total) by mouth daily. 02/15/16   Bhagat, Crista Luria, PA  diltiazem (DILACOR XR) 120 MG 24 hr capsule Take 120 mg by mouth daily.    [provider]  furosemide (LASIX) 40 MG tablet Take 40 mg by mouth daily.    [provider]  HYDROcodone-acetaminophen  (NORCO/VICODIN) 5-325 MG tablet Take 2 tablets by mouth every 4 (four) hours as needed. 05/14/17   Volanda Napoleon, PA-C  metoprolol succinate (TOPROL-XL) 25 MG 24 hr tablet Take 25 mg by mouth daily.    [provider]  montelukast (SINGULAIR) 10 MG tablet Take 10 mg by mouth daily.    [provider]  nitroGLYCERIN (NITROSTAT) 0.4 MG SL tablet Place 1 tablet (0.4 mg total) under the tongue every 5 (five) minutes as needed for chest pain. 02/15/16   Bhagat, Crista Luria, PA  omeprazole (PRILOSEC) 20 MG capsule Take 20 mg by mouth daily. 02/26/17   [provider]  pantoprazole (PROTONIX) 40 MG tablet Take 1 tablet (40 mg total) by mouth daily. 04/13/17   Isaiah Serge, NP  potassium chloride (K-DUR) 10 MEQ tablet Take 1 tablet (10 mEq total) by mouth daily. 05/27/16   Dorothy Spark, MD  rosuvastatin (CRESTOR) 20 MG tablet Take 20 mg by mouth daily.    [provider]    Social History   Social History  . Marital status: Legally Separated    Spouse name: N/A  . Number of children: N/A  . Years of education: N/A   Occupational History  . Truck Geophysicist/field seismologist    Social History Main Topics  . Smoking status: Never Smoker  . Smokeless tobacco: Never Used  . Alcohol use Yes  . Drug use: No  . Sexual activity: Not on file   Other Topics Concern  . Not on file   Social History Narrative  . No narrative on file     Family History  Problem Relation Age of Onset  . CAD Father        2 stents ~ 79  . Heart attack Unknown   . Hyperlipidemia Unknown     REVIEW OF SYSTEMS (negative unless checked):   Cardiac:  []  Chest pain or chest pressure? []  Shortness of breath upon activity? []  Shortness of breath when lying flat? []  Irregular heart rhythm?  Vascular:  []  Pain in calf, thigh, or hip brought on by walking? []  Pain in feet at night that wakes you up from your sleep? []  Blood clot in your veins? []  Leg swelling?  Pulmonary:  []  Oxygen at  home? []  Productive cough? []  Wheezing?  Neurologic:  []  Sudden weakness in arms or legs? []  Sudden numbness in arms or legs? []  Sudden onset of difficult speaking or slurred speech? []  Temporary loss of vision in one eye? []  Problems with dizziness?  Gastrointestinal:  []  Blood in stool? []  Vomited blood?  Genitourinary:  []  Burning when urinating? []  Blood in urine?  Psychiatric:  []  Major depression  Hematologic:  [x]  Bleeding problems- bruising []  Problems with blood clotting?  Dermatologic:  []  Rashes or ulcers?  Constitutional:  []  Fever or chills?  Ear/Nose/Throat:  []  Change in hearing? []  Nose bleeds? []  Sore throat?  Musculoskeletal:  []  Back pain? []  Joint pain? []  Muscle  pain?    Physical Examination  Vitals:   05/14/17 1430 05/14/17 1500  BP: (!) 113/59 (!) 121/59  Pulse: 69 85  Resp: (!) 23 18  Temp:     Body mass index is 46.72 kg/m.  General:  WDWN in NAD Gait: Not observed HENT: WNL, normocephalic Pulmonary: normal non-labored breathing Cardiac: rrr Abdomen: soft, NT/ND, no masses Extremities:palpable right and left dp Non edema Significant bruising of posterior right thigh Palpable psa in right groin without pulsatility Neurologic: A&O X 3; ; SENSATION: normal; MOTOR FUNCTION:  moving all extremities equally. Speech is fluent/normal Psychiatric:  Appropriate mood and affect  CBC    Component Value Date/Time   WBC 7.4 05/14/2017 1157   RBC 4.38 05/14/2017 1157   HGB 12.6 (L) 05/14/2017 1157   HGB 14.6 04/13/2017 1536   HCT 40.5 05/14/2017 1157   HCT 43.8 04/13/2017 1536   PLT 85 (L) 05/14/2017 1157   PLT 98 (LL) 04/13/2017 1536   MCV 92.5 05/14/2017 1157   MCV 87 04/13/2017 1536   MCH 28.8 05/14/2017 1157   MCHC 31.1 05/14/2017 1157   RDW 15.7 (H) 05/14/2017 1157   RDW 15.8 (H) 04/13/2017 1536   LYMPHSABS 1.3 05/14/2017 1157   LYMPHSABS 1.9 04/13/2017 1536   MONOABS 0.9 05/14/2017 1157   EOSABS 0.6 05/14/2017  1157   EOSABS 0.4 04/13/2017 1536   BASOSABS 0.0 05/14/2017 1157   BASOSABS 0.0 04/13/2017 1536    BMET    Component Value Date/Time   NA 139 05/14/2017 1157   NA 139 04/07/2017 1511   K 4.0 05/14/2017 1157   CL 107 05/14/2017 1157   CO2 26 05/14/2017 1157   GLUCOSE 107 (H) 05/14/2017 1157   BUN 15 05/14/2017 1157   BUN 19 04/07/2017 1511   CREATININE 1.43 (H) 05/14/2017 1157   CREATININE 1.44 (H) 11/26/2016 0912   CALCIUM 8.9 05/14/2017 1157   GFRNONAA 58 (L) 05/14/2017 1157   GFRAA >60 05/14/2017 1157    COAGS: Lab Results  Component Value Date   INR 1.00 05/14/2017   INR 1.0 04/13/2017   INR 1.21 02/13/2016     Non-Invasive Vascular Imaging:      ASSESSMENT/PLAN: This is a 46 y.o. male with right femoral psa s/p cardiac cath. At this time he is on Ellquis and relatively asymptomatic on the pseudoaneurysm. No intervention to be undertaken given the anti-coagulation. We will have him follow-up next week and we'll have him hold anticoagulation prior to follow-up. If it has not thrombosed at this time we will proceed with intervention likely thrombin injection versus surgical intervention. This was discussed with patient who agrees and will plan to see him next week.  Lavaris Sexson C. Donzetta Matters, MD [facility] Office: [phone] Pager: [phone]

## 2017-05-20 ENCOUNTER — Encounter: Payer: Self-pay | Admitting: Vascular Surgery

## 2017-05-20 ENCOUNTER — Ambulatory Visit (INDEPENDENT_AMBULATORY_CARE_PROVIDER_SITE_OTHER): Payer: BLUE CROSS/BLUE SHIELD | Admitting: Vascular Surgery

## 2017-05-20 ENCOUNTER — Ambulatory Visit (HOSPITAL_COMMUNITY)
Admit: 2017-05-20 | Discharge: 2017-05-20 | Disposition: A | Discharge status: Home / Self Care | Payer: BLUE CROSS/BLUE SHIELD | Attending: Vascular Surgery | Admitting: Vascular Surgery

## 2017-05-20 VITALS — BP 118/71 | HR 58 | Temp 97.5°F | Resp 16 | Ht 71.0 in | Wt 336.0 lb

## 2017-05-20 DIAGNOSIS — I9789 Other postprocedural complications and disorders of the circulatory system, not elsewhere classified: Secondary | ICD-10-CM | POA: Diagnosis not present

## 2017-05-20 DIAGNOSIS — I729 Aneurysm of unspecified site: Secondary | ICD-10-CM | POA: Diagnosis not present

## 2017-05-20 DIAGNOSIS — T81718A Complication of other artery following a procedure, not elsewhere classified, initial encounter: Secondary | ICD-10-CM

## 2017-05-20 NOTE — Progress Notes (Signed)
Patient ID: Sean [name], male   DOB: 03-15-1971, 46 y.o.   MRN: [id number]  Reason for Consult: Re-evaluation   Referred by Raina Mina., MD  Subjective:     HPI:  Sean [name] is a 46 y.o. 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 He is now held eliquis 2 days presents with repeat duplex today. He continues to have pain of his right medial thigh and groin is not having any pain of his foot.  Past Medical History:  Diagnosis Date  . Abnormal liver enzymes    a. Sees a doctor in [place].  . Cardiac cirrhosis    a. possible elevated LFTs/low platelets felt due to cardiac cirrhosis per 2017 admission.  . Chronic combined systolic and diastolic CHF (congestive heart failure) ([place])   . CKD (chronic kidney disease), stage II   . Congenital heart defect    a. rightward rotation of heart, almost dextrocardia  . Coronary artery disease    a. s/p CABGx1 in 12/2015.  Marland Kitchen Hematuria    a. Chronic hx of this, no prior etiology determined through workup per patient.  . Hypercholesteremia    a. Prev taken off statin due to abnormal liver function.  . Hypertension   . Morbid obesity ([place])   . Paroxysmal atrial flutter ([place]) 02/12/2016  . 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 Sinus bradycardia   . Thrombocytopenia ([place])    Family History  Problem Relation Age of Onset  . CAD Father        2 stents ~ 35  . Heart attack Unknown   . Hyperlipidemia Unknown    Past Surgical History:  Procedure Laterality Date  . APPENDECTOMY    . CARDIAC CATHETERIZATION N/A 12/24/2015   Procedure: Right/Left Heart Cath and Coronary Angiography;  Surgeon: Jolaine Artist, MD;  Location: [place] [facility];  Service: Cardiovascular;  Laterality: N/A;  . CARDIOVERSION N/A 02/14/2016   Procedure: CARDIOVERSION;  Surgeon: Pixie Casino, MD;  Location[facility] ENDOSCOPY;  Service: Cardiovascular;  Laterality: N/A;  . CORONARY ARTERY BYPASS GRAFT N/A 12/26/2015   Procedure: Off Pump Coronary Artery Bypass Grafting times one using left internal mammary artery;  Surgeon: Rexene Alberts, MD;  Location: [place];  Service: Open Heart Surgery;  Laterality: N/A;  . GALLBLADDER SURGERY    . HERNIA REPAIR    . LEFT HEART CATH AND CORS/GRAFTS ANGIOGRAPHY N/A 04/15/2017   Procedure: Left Heart Cath and Cors/Grafts Angiography;  Surgeon: Troy Sine, MD;  Location: [place] [facility];  Service: Cardiovascular;  Laterality: N/A;  . TEE WITHOUT CARDIOVERSION N/A 12/26/2015   Procedure: TRANSESOPHAGEAL ECHOCARDIOGRAM (TEE);  Surgeon: Rexene Alberts, MD;  Location: [place];  Service: Open Heart Surgery;  Laterality: N/A;  . TEE WITHOUT CARDIOVERSION N/A 02/14/2016   Procedure: TRANSESOPHAGEAL ECHOCARDIOGRAM (TEE);  Surgeon: Pixie Casino, MD;  Location: [facility] ENDOSCOPY;  Service: Cardiovascular;  Laterality: N/A;    Short Social History:  Social History  Substance Use Topics  . Smoking status: Never Smoker  . Smokeless tobacco: Never Used  . Alcohol use Yes    Allergies  Allergen Reactions  . Imdur [Isosorbide Dinitrate] Diarrhea    Pt states causes "diarrhea."  . Augmentin [Amoxicillin-Pot Clavulanate] Itching  . Tape Rash    Current Outpatient Prescriptions  Medication Sig Dispense  Refill  . allopurinol (ZYLOPRIM) 100 MG tablet Take 1 tablet (100 mg total) by mouth daily. 30 tablet 1  . apixaban (ELIQUIS) 5 MG TABS tablet Take 1 tablet (5 mg total) by mouth 2 (two) times daily. 60 tablet 5  . aspirin 81 MG tablet Take 1 tablet (81 mg total) by mouth daily.    Marland Kitchen diltiazem (DILACOR XR) 120 MG 24 hr capsule Take 120 mg by mouth daily.    . furosemide (LASIX) 40 MG tablet Take 40 mg by mouth daily.    . metoprolol succinate (TOPROL-XL) 25 MG 24 hr tablet Take 25 mg by mouth daily.    . montelukast (SINGULAIR) 10 MG tablet Take 10 mg  by mouth daily.    . nitroGLYCERIN (NITROSTAT) 0.4 MG SL tablet Place 1 tablet (0.4 mg total) under the tongue every 5 (five) minutes as needed for chest pain. 25 tablet 12  . omeprazole (PRILOSEC) 20 MG capsule Take 20 mg by mouth daily.  1  . pantoprazole (PROTONIX) 40 MG tablet Take 1 tablet (40 mg total) by mouth daily. 37 tablet 6  . potassium chloride (K-DUR) 10 MEQ tablet Take 1 tablet (10 mEq total) by mouth daily. 90 tablet 3  . rosuvastatin (CRESTOR) 20 MG tablet Take 20 mg by mouth daily.    Marland Kitchen HYDROcodone-acetaminophen (NORCO/VICODIN) 5-325 MG tablet Take 2 tablets by mouth every 4 (four) hours as needed. (Patient not taking: Reported on 05/20/2017) 12 tablet 0   No current facility-administered medications for this visit.     Review of Systems  Constitutional:  Constitutional negative. Cardiovascular: Positive for irregular heartbeat.  Musculoskeletal: Positive for leg pain.  Skin: Skin negative.  Neurological: Neurological negative. Hematologic: Positive for bruises/bleeds easily.        Objective:  Objective   Vitals:   05/20/17 0929  BP: 118/71  Pulse: (!) 58  Resp: 16  Temp: 97.5 F (36.4 C)  SpO2: 97%  Weight: (!) 336 lb (152.4 kg)  Height: 5\' 11"  (1.803 m)   Body mass index is 46.86 kg/m.  Physical Exam  Constitutional: He is oriented to person, place, and time. He appears well-developed.  HENT:  Head: Normocephalic.  Cardiovascular: An irregular rhythm present.  Pulses:      Dorsalis pedis pulses are 2+ on the right side.  Pulmonary/Chest: Effort normal.  Abdominal: Soft.  Musculoskeletal: He exhibits no edema.  Neurological: He is alert and oriented to person, place, and time.  Skin: Skin is warm and dry.  Psychiatric: He has a normal mood and affect. His behavior is normal. Judgment and thought content normal.    Data: I have independently interpreted his right groin duplex and his pseudoaneurysm has thrombosed.     Assessment/Plan:      46 year old male here for follow-up right femoral pseudoaneurysm that is now thrombosed by duplex. He has a palpable dorsalis pedis pulse on the right. I told him it is okay to resume his eliquis He'll follow-up on a when necessary basis.     Waynetta Sandy MD [facility]

## 2017-06-29 ENCOUNTER — Other Ambulatory Visit: Payer: Self-pay | Admitting: Cardiology

## 2017-08-20 ENCOUNTER — Encounter: Payer: Self-pay | Admitting: *Deleted

## 2017-08-21 ENCOUNTER — Ambulatory Visit (INDEPENDENT_AMBULATORY_CARE_PROVIDER_SITE_OTHER): Payer: BLUE CROSS/BLUE SHIELD | Admitting: Cardiology

## 2017-08-21 ENCOUNTER — Other Ambulatory Visit: Payer: Self-pay | Admitting: Cardiology

## 2017-08-21 ENCOUNTER — Encounter: Payer: Self-pay | Admitting: Cardiology

## 2017-08-21 VITALS — BP 124/72 | HR 89 | Ht 71.0 in | Wt 344.0 lb

## 2017-08-21 DIAGNOSIS — I10 Essential (primary) hypertension: Secondary | ICD-10-CM | POA: Diagnosis not present

## 2017-08-21 DIAGNOSIS — G4733 Obstructive sleep apnea (adult) (pediatric): Secondary | ICD-10-CM | POA: Diagnosis not present

## 2017-08-21 DIAGNOSIS — N183 Chronic kidney disease, stage 3 unspecified: Secondary | ICD-10-CM

## 2017-08-21 DIAGNOSIS — I2510 Atherosclerotic heart disease of native coronary artery without angina pectoris: Secondary | ICD-10-CM | POA: Diagnosis not present

## 2017-08-21 DIAGNOSIS — I4892 Unspecified atrial flutter: Secondary | ICD-10-CM | POA: Diagnosis not present

## 2017-08-21 DIAGNOSIS — Z951 Presence of aortocoronary bypass graft: Secondary | ICD-10-CM

## 2017-08-21 DIAGNOSIS — E782 Mixed hyperlipidemia: Secondary | ICD-10-CM

## 2017-08-21 MED ORDER — POTASSIUM CHLORIDE CRYS ER 10 MEQ PO TBCR: 10.0000 meq | EXTENDED_RELEASE_TABLET | Freq: Every day | ORAL | 3 refills | Status: DC

## 2017-08-21 MED ORDER — ROSUVASTATIN CALCIUM 20 MG PO TABS: 20.0000 mg | ORAL_TABLET | Freq: Every day | ORAL | 3 refills | Status: DC

## 2017-08-21 MED ORDER — DILTIAZEM HCL ER 120 MG PO CP24: 120.0000 mg | ORAL_CAPSULE | Freq: Every day | ORAL | 3 refills | Status: DC

## 2017-08-21 MED ORDER — FUROSEMIDE 40 MG PO TABS: 40.0000 mg | ORAL_TABLET | Freq: Every day | ORAL | 3 refills | Status: DC

## 2017-08-21 MED ORDER — APIXABAN 5 MG PO TABS: 5.0000 mg | ORAL_TABLET | Freq: Two times a day (BID) | ORAL | 3 refills | Status: DC

## 2017-08-21 MED ORDER — METOPROLOL SUCCINATE ER 25 MG PO TB24: 25.0000 mg | ORAL_TABLET | Freq: Every day | ORAL | 3 refills | Status: DC

## 2017-08-21 NOTE — Patient Instructions (Signed)
Medication Instructions:   Your physician recommends that you continue on your current medications as directed. Please refer to the Current Medication list given to you today.    Labwork:  TODAY--CMET, CBC W DIFF, TSH, PRO-BNP     Follow-Up:  Your physician wants you to follow-up in: [place] will receive a reminder letter in the mail two months in advance. If you don't receive a letter, please call our office to schedule the follow-up appointment.        If you need a refill on your cardiac medications before your next appointment, please call your pharmacy.

## 2017-08-21 NOTE — Progress Notes (Signed)
Cardiology Office Note:    Date:  08/21/2017   ID:  Sean [name], DOB 07/29/1971, MRN [id number]  PCP:  Raina Mina., MD  Cardiologist:  Ena Dawley, MD    Referring MD: Raina Mina., MD   Reason for visit: 4 months follow up  History of Present Illness:    Sean [name] is a 46 y.o. male with a hx of morbid obesity, hypertension, hyperlipidemia, CAD, s/p CABG CABG, LIMA to LAD in 12/2015.  He had recurrent chest pain but cardiac cath in May 2018 with stable CAD.   Hx of a fib/flutter on Eliquis.  Hx of DCCV.  But is maintaining SR.    08/21/2017 - the patient is coming after 4 months in the meantime he has gained 10 pounds, he denies any orthopnea or paroxysmal nocturnal dyspnea, he has mild lower extremity edema that is chronic, is not very compliant with healthy diet and eats out a lot, he states that he is watching his salt intake. He is being compliant with medications, denies any side effects including muscle pain or bleeding. His no palpitations dizziness or syncope. He is using C PAP at night.   Past Medical History:  Diagnosis Date  . Abnormal liver enzymes    a. Sees a doctor in [place].  . Cardiac cirrhosis    a. possible elevated LFTs/low platelets felt due to cardiac cirrhosis per 2017 admission.  . Chronic combined systolic and diastolic CHF (congestive heart failure) ([place])   . CKD (chronic kidney disease), stage II   . Congenital heart defect    a. rightward rotation of heart, almost dextrocardia  . Coronary artery disease    a. s/p CABGx1 in 12/2015.  Marland Kitchen Hematuria    a. Chronic hx of this, no prior etiology determined through workup per patient.  . Hypercholesteremia    a. Prev taken off statin due to abnormal liver function.  . Hypertension   . Morbid obesity ([place])   . Paroxysmal atrial flutter ([place]) 02/12/2016  . 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 Sinus bradycardia   . Thrombocytopenia ([place])     Past Surgical History:    Procedure Laterality Date  . APPENDECTOMY    . CARDIAC CATHETERIZATION N/A 12/24/2015   Procedure: Right/Left Heart Cath and Coronary Angiography;  Surgeon: Jolaine Artist, MD;  Location: [place] [facility];  Service: Cardiovascular;  Laterality: N/A;  . CARDIOVERSION N/A 02/14/2016   Procedure: CARDIOVERSION;  Surgeon: Pixie Casino, MD;  Location[facility] ENDOSCOPY;  Service: Cardiovascular;  Laterality: N/A;  . CORONARY ARTERY BYPASS GRAFT N/A 12/26/2015   Procedure: Off Pump Coronary Artery Bypass Grafting times one using left internal mammary artery;  Surgeon: Rexene Alberts, MD;  Location: [place];  Service: Open Heart Surgery;  Laterality: N/A;  . GALLBLADDER SURGERY    . HERNIA REPAIR    . LEFT HEART CATH AND CORS/GRAFTS ANGIOGRAPHY N/A 04/15/2017   Procedure: Left Heart Cath and Cors/Grafts Angiography;  Surgeon: Troy Sine, MD;  Location: [place] [facility];  Service: Cardiovascular;  Laterality: N/A;  . TEE WITHOUT CARDIOVERSION N/A 12/26/2015   Procedure: TRANSESOPHAGEAL ECHOCARDIOGRAM (TEE);  Surgeon: Rexene Alberts, MD;  Location: [place];  Service: Open Heart Surgery;  Laterality: N/A;  . TEE WITHOUT CARDIOVERSION N/A 02/14/2016   Procedure: TRANSESOPHAGEAL ECHOCARDIOGRAM (TEE);  Surgeon: Pixie Casino, MD;  Location: [facility] ENDOSCOPY;  Service: Cardiovascular;  Laterality: N/A;    Current Medications:  Current Meds  Medication Sig  . allopurinol (ZYLOPRIM) 100 MG tablet Take 1 tablet (100 mg total) by mouth daily.  Marland Kitchen apixaban (ELIQUIS) 5 MG TABS tablet Take 1 tablet (5 mg total) by mouth 2 (two) times daily.  Marland Kitchen aspirin 81 MG tablet Take 1 tablet (81 mg total) by mouth daily.  Marland Kitchen diltiazem (DILACOR XR) 120 MG 24 hr capsule Take 1 capsule (120 mg total) by mouth daily.  . furosemide (LASIX) 40 MG tablet Take 1 tablet (40 mg total) by mouth daily.  . metoprolol succinate (TOPROL-XL) 25 MG 24 hr tablet Take 1 tablet (25 mg total) by mouth daily.  . montelukast (SINGULAIR) 10 MG  tablet Take 10 mg by mouth daily.  . nitroGLYCERIN (NITROSTAT) 0.4 MG SL tablet Place 1 tablet (0.4 mg total) under the tongue every 5 (five) minutes as needed for chest pain.  Marland Kitchen omeprazole (PRILOSEC) 20 MG capsule Take 20 mg by mouth daily.  . pantoprazole (PROTONIX) 40 MG tablet Take 1 tablet (40 mg total) by mouth daily.  . potassium chloride (K-DUR,KLOR-CON) 10 MEQ tablet Take 1 tablet (10 mEq total) by mouth daily.  . rosuvastatin (CRESTOR) 20 MG tablet Take 1 tablet (20 mg total) by mouth daily.  . [DISCONTINUED] diltiazem (DILACOR XR) 120 MG 24 hr capsule Take 120 mg by mouth daily.  . [DISCONTINUED] ELIQUIS 5 MG TABS tablet TAKE 1 TABLET BY MOUTH TWICE DAILY.  . [DISCONTINUED] furosemide (LASIX) 40 MG tablet Take 40 mg by mouth daily.  . [DISCONTINUED] metoprolol succinate (TOPROL-XL) 25 MG 24 hr tablet Take 25 mg by mouth daily.  . [DISCONTINUED] potassium chloride (K-DUR,KLOR-CON) 10 MEQ tablet Take 1 tablet (10 mEq total) by mouth daily.  . [DISCONTINUED] rosuvastatin (CRESTOR) 20 MG tablet Take 20 mg by mouth daily.     Allergies:   Imdur [isosorbide dinitrate]; Augmentin [amoxicillin-pot clavulanate]; and Tape   Social History   Social History  . Marital status: Legally Separated    Spouse name: N/A  . Number of children: N/A  . Years of education: N/A   Occupational History  . Truck Geophysicist/field seismologist    Social History Main Topics  . Smoking status: Never Smoker  . Smokeless tobacco: Never Used  . Alcohol use Yes  . Drug use: No  . Sexual activity: Not Asked   Other Topics Concern  . None   Social History Narrative  . None     Family History: The patient's family history includes CAD in his father; Heart attack in his unknown relative; Hyperlipidemia in his unknown relative. ROS:   Please see the history of present illness.     All other systems reviewed and are negative.  EKGs/Labs/Other Studies Reviewed:    Recent Labs: 12/29/2016: ALT 83; TSH 2.410 04/07/2017:  NT-Pro BNP 32 05/14/2017: BUN 15; Creatinine, Ser 1.43; Hemoglobin 12.6; Platelets 85; Potassium 4.0; Sodium 139  Recent Lipid Panel    Component Value Date/Time   CHOL 145 11/26/2016 0912   TRIG 92 11/26/2016 0912   HDL 53 11/26/2016 0912   CHOLHDL 2.7 11/26/2016 0912   VLDL 18 11/26/2016 0912   LDLCALC 74 11/26/2016 0912    Physical Exam:    VS:  BP 124/72   Pulse 89   Ht '5\' 11"'  (1.803 m)   Wt (!) 344 lb (156 kg)   BMI 47.98 kg/m     Wt Readings from Last 3 Encounters:  08/21/17 (!) 344 lb (156 kg)  05/20/17 (!) 336 lb (152.4 kg)  05/14/17 (!) 335 lb (152 kg)     GEN: Obese ,, well developed in no acute distress HEENT: Normal NECK: No JVD; No carotid bruits LYMPHATICS: No lymphadenopathy CARDIAC:,RRR, no murmurs, rubs, gallops RESPIRATORY:  Clear to auscultation without rales, wheezing or rhonchi  ABDOMEN: Soft, non-tender, non-distended MUSCULOSKELETAL: mild non-pitting B/L edema; No deformity  SKIN: Warm and dry NEUROLOGIC:  Alert and oriented x 3 PSYCHIATRIC:  Normal affect   ASSESSMENT:    1. Coronary artery disease involving native coronary artery of native heart without angina pectoris   2. Hx of CABG   3. Essential hypertension   4. Mixed hyperlipidemia   5. Stage 3 chronic kidney disease   6. OSA (obstructive sleep apnea)   7. Paroxysmal atrial flutter (H[facility])   8. Morbid obesity ([place])    PLAN:    In order of problems listed above:  1. CAD, s/p CABG x1, LIMA to LAD in January 2017. Repeat In May 2018 showed stable findings, will continue aspirin, metoprolol, Crestor. His EKG today shows sinus rhythm with negative T waves in the inferior and anterior leads however he is completely asymptomatic I will not proceed with ischemic workup.  2. Paroxsymal Atrial Flutter: s/p recent DCCV 02/26/16. Maintaining NSR. He is on anticoagulation with Eliquis and has no bleeding.  3. CAD s/p CABG x1V (12/2015) : As above.   4. Acute on Acute on chronic Systolic HF:  We're unable to tell his LVEF on echocardiogram as his images are so poor, LVEF estimated at 45-50%, he is not on ACEI or ARB due to CKD stage III. He mild lower extremity edema today, we will obtain BMP and BNP these might be just because he is noncompliant with his diet and has morbid obesity.  5. OSA - sleep significantly improved on CPAP machine.  6. Morbid obesity with recent weight gain, the patient has no insight into the problem states that he eats healthy, however eats out a lot and absolutely not active.  Medication Adjustments/Labs and Tests Ordered: Current medicines are reviewed at length with the patient today.  Concerns regarding medicines are outlined above.  Orders Placed This Encounter  Procedures  . Comp Met (CMET)  . CBC w/Diff  . TSH  . Pro b natriuretic peptide  . EKG 12-Lead     Signed, Ena Dawley, MD  08/21/2017 12:27 PM    [facility]

## 2017-08-22 LAB — COMPREHENSIVE METABOLIC PANEL
ALT: 113 IU/L — ABNORMAL HIGH (ref 0–44)
AST: 64 IU/L — ABNORMAL HIGH (ref 0–40)
Albumin/Globulin Ratio: 1.4 (ref 1.2–2.2)
Albumin: 4 g/dL (ref 3.5–5.5)
Alkaline Phosphatase: 359 IU/L — ABNORMAL HIGH (ref 39–117)
BUN/Creatinine Ratio: 14 (ref 9–20)
BUN: 20 mg/dL (ref 6–24)
Bilirubin Total: 0.5 mg/dL (ref 0.0–1.2)
CO2: 25 mmol/L (ref 20–29)
Calcium: 9.5 mg/dL (ref 8.7–10.2)
Chloride: 104 mmol/L (ref 96–106)
Creatinine, Ser: 1.48 mg/dL — ABNORMAL HIGH (ref 0.76–1.27)
GFR calc Af Amer: 66 mL/min/{1.73_m2} (ref >59)
GFR calc non Af Amer: 57 mL/min/{1.73_m2} — ABNORMAL LOW (ref >59)
Globulin, Total: 2.9 g/dL (ref 1.5–4.5)
Glucose: 117 mg/dL — ABNORMAL HIGH (ref 65–99)
Potassium: 4.2 mmol/L (ref 3.5–5.2)
Sodium: 145 mmol/L — ABNORMAL HIGH (ref 134–144)
Total Protein: 6.9 g/dL (ref 6.0–8.5)

## 2017-08-22 LAB — CBC WITH DIFFERENTIAL/PLATELET
Basophils Absolute: 0 10*3/uL (ref 0.0–0.2)
Basos: 0 %
EOS (ABSOLUTE): 0.3 10*3/uL (ref 0.0–0.4)
Eos: 5 %
Hematocrit: 43.3 % (ref 37.5–51.0)
Hemoglobin: 14 g/dL (ref 13.0–17.7)
Immature Grans (Abs): 0 10*3/uL (ref 0.0–0.1)
Immature Granulocytes: 0 %
Lymphocytes Absolute: 1.9 10*3/uL (ref 0.7–3.1)
Lymphs: 30 %
MCH: 28.2 pg (ref 26.6–33.0)
MCHC: 32.3 g/dL (ref 31.5–35.7)
MCV: 87 fL (ref 79–97)
Monocytes Absolute: 0.9 10*3/uL (ref 0.1–0.9)
Monocytes: 15 %
Neutrophils Absolute: 3.1 10*3/uL (ref 1.4–7.0)
Neutrophils: 50 %
Platelets: 83 10*3/uL — CL (ref 150–379)
RBC: 4.97 x10E6/uL (ref 4.14–5.80)
RDW: 15.4 % (ref 12.3–15.4)
WBC: 6.2 10*3/uL (ref 3.4–10.8)

## 2017-08-22 LAB — PRO B NATRIURETIC PEPTIDE: NT-Pro BNP: 23 pg/mL (ref 0–86)

## 2017-08-22 LAB — TSH: TSH: 3.02 u[IU]/mL (ref 0.450–4.500)

## 2017-11-27 DIAGNOSIS — Z7901 Long term (current) use of anticoagulants: Secondary | ICD-10-CM | POA: Diagnosis not present

## 2017-11-27 DIAGNOSIS — I4891 Unspecified atrial fibrillation: Secondary | ICD-10-CM | POA: Diagnosis not present

## 2017-11-27 DIAGNOSIS — D473 Essential (hemorrhagic) thrombocythemia: Secondary | ICD-10-CM | POA: Diagnosis not present

## 2018-02-03 ENCOUNTER — Encounter: Payer: Self-pay | Admitting: Cardiology

## 2018-02-05 ENCOUNTER — Other Ambulatory Visit: Payer: Self-pay | Admitting: Physician Assistant

## 2018-02-05 NOTE — Telephone Encounter (Signed)
Please review for refill, Thanks !  

## 2018-02-08 ENCOUNTER — Encounter: Payer: Self-pay | Admitting: Cardiology

## 2018-02-08 ENCOUNTER — Ambulatory Visit: Payer: BLUE CROSS/BLUE SHIELD | Admitting: Cardiology

## 2018-02-08 VITALS — BP 120/80 | HR 61 | Ht 71.0 in | Wt 356.0 lb

## 2018-02-08 DIAGNOSIS — I10 Essential (primary) hypertension: Secondary | ICD-10-CM | POA: Diagnosis not present

## 2018-02-08 DIAGNOSIS — E7849 Other hyperlipidemia: Secondary | ICD-10-CM | POA: Diagnosis not present

## 2018-02-08 DIAGNOSIS — I2510 Atherosclerotic heart disease of native coronary artery without angina pectoris: Secondary | ICD-10-CM | POA: Diagnosis not present

## 2018-02-08 DIAGNOSIS — Z951 Presence of aortocoronary bypass graft: Secondary | ICD-10-CM | POA: Diagnosis not present

## 2018-02-08 MED ORDER — APIXABAN 5 MG PO TABS: 5.0000 mg | ORAL_TABLET | Freq: Two times a day (BID) | ORAL | 3 refills | Status: DC

## 2018-02-08 MED ORDER — METOPROLOL SUCCINATE ER 25 MG PO TB24: 25.0000 mg | ORAL_TABLET | Freq: Every day | ORAL | 2 refills | Status: DC

## 2018-02-08 MED ORDER — POTASSIUM CHLORIDE CRYS ER 10 MEQ PO TBCR: 10.0000 meq | EXTENDED_RELEASE_TABLET | Freq: Every day | ORAL | 3 refills | Status: DC

## 2018-02-08 MED ORDER — FUROSEMIDE 40 MG PO TABS: 40.0000 mg | ORAL_TABLET | Freq: Every day | ORAL | 3 refills | Status: DC

## 2018-02-08 MED ORDER — ROSUVASTATIN CALCIUM 20 MG PO TABS: 20.0000 mg | ORAL_TABLET | Freq: Every day | ORAL | 3 refills | Status: DC

## 2018-02-08 MED ORDER — DILTIAZEM HCL ER 120 MG PO CP24: 120.0000 mg | ORAL_CAPSULE | Freq: Every day | ORAL | 3 refills | Status: DC

## 2018-02-08 NOTE — Patient Instructions (Addendum)
Medication Instructions:   Your physician recommends that you continue on your current medications as directed. Please refer to the Current Medication list given to you today.    Labwork:  TODAY--CMET     Follow-Up:  Your physician wants you to follow-up in: [place] will receive a reminder letter in the mail two months in advance. If you don't receive a letter, please call our office to schedule the follow-up appointment.        If you need a refill on your cardiac medications before your next appointment, please call your pharmacy.

## 2018-02-08 NOTE — Progress Notes (Signed)
Cardiology Office Note:    Date:  02/08/2018   ID:  Sean AGNER, DOB 1970/12/31, MRN [id number]  PCP:  Sean [name]., MD  Cardiologist:  Sean Dawley, MD    Referring MD: Sean [name]., MD   Reason for visit: 4 months follow up  History of Present Illness:    Sean [name] is a 47 y.o. male with a hx of morbid obesity, hypertension, hyperlipidemia, CAD, s/p CABG CABG, LIMA to LAD in 12/2015.  He had recurrent chest pain but cardiac cath in May 2018 with stable CAD.   Hx of a fib/flutter on Eliquis.  Hx of DCCV.  But is maintaining SR.    08/21/2017 - the patient is coming after 4 months in the meantime he has gained 10 pounds, he denies any orthopnea or paroxysmal nocturnal dyspnea, he has mild lower extremity edema that is chronic, is not very compliant with healthy diet and eats out a lot, he states that he is watching his salt intake. He is being compliant with medications, denies any side effects including muscle pain or bleeding. His no palpitations dizziness or syncope. He is using C PAP at night.  02/08/18 - 6 months follow up, denies any chest pain, DOE, he continues to drive truck. He has gained 20 LBS since the last visit, no restrictions on food. Wears compression socks and has mild residual LE edema. He is compliant with meds and has no side effects.    Past Medical History:  Diagnosis Date  . Abnormal liver enzymes    a. Sees a doctor in [place].  . Cardiac cirrhosis    a. possible elevated LFTs/low platelets felt due to cardiac cirrhosis per 2017 admission.  . Chronic combined systolic and diastolic CHF (congestive heart failure) ([place])   . CKD (chronic kidney disease), stage II   . Congenital heart defect    a. rightward rotation of heart, almost dextrocardia  . Coronary artery disease    a. s/p CABGx1 in 12/2015.  Marland Kitchen Hematuria    a. Chronic hx of this, no prior etiology determined through workup per patient.  . Hypercholesteremia    a. Prev taken off  statin due to abnormal liver function.  . Hypertension   . Morbid obesity ([place])   . Paroxysmal atrial flutter ([place]) 02/12/2016  . 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 Sinus bradycardia   . Thrombocytopenia ([place])     Past Surgical History:  Procedure Laterality Date  . APPENDECTOMY    . CARDIAC CATHETERIZATION N/A 12/24/2015   Procedure: Right/Left Heart Cath and Coronary Angiography;  Surgeon: Sean Artist, MD;  Location: [place] [facility];  Service: Cardiovascular;  Laterality: N/A;  . CARDIOVERSION N/A 02/14/2016   Procedure: CARDIOVERSION;  Surgeon: Sean Casino, MD;  Location: [facility] ENDOSCOPY;  Service: Cardiovascular;  Laterality: N/A;  . CORONARY ARTERY BYPASS GRAFT N/A 12/26/2015   Procedure: Off Pump Coronary Artery Bypass Grafting times one using left internal mammary artery;  Surgeon: Sean Alberts, MD;  Location: [place];  Service: Open Heart Surgery;  Laterality: N/A;  . GALLBLADDER SURGERY    . HERNIA REPAIR    . LEFT HEART CATH AND CORS/GRAFTS ANGIOGRAPHY N/A 04/15/2017   Procedure: Left Heart Cath and Cors/Grafts Angiography;  Surgeon: Sean Sine, MD;  Location: [place] [facility];  Service: Cardiovascular;  Laterality: N/A;  . TEE WITHOUT CARDIOVERSION N/A 12/26/2015   Procedure: TRANSESOPHAGEAL ECHOCARDIOGRAM (TEE);  Surgeon: Sean [name]  Sean Barre, MD;  Location: [place];  Service: Open Heart Surgery;  Laterality: N/A;  . TEE WITHOUT CARDIOVERSION N/A 02/14/2016   Procedure: TRANSESOPHAGEAL ECHOCARDIOGRAM (TEE);  Surgeon: Sean Casino, MD;  Location: [facility] ENDOSCOPY;  Service: Cardiovascular;  Laterality: N/A;    Current Medications: Current Meds  Medication Sig  . allopurinol (ZYLOPRIM) 100 MG tablet Take 1 tablet (100 mg total) by mouth daily.  Marland Kitchen apixaban (ELIQUIS) 5 MG TABS tablet Take 1 tablet (5 mg total) by mouth 2 (two) times daily.  Marland Kitchen aspirin 81 MG tablet Take 1 tablet (81 mg total) by mouth daily.  Marland Kitchen diltiazem (DILACOR XR) 120 MG 24 hr capsule  Take 1 capsule (120 mg total) by mouth daily.  . furosemide (LASIX) 40 MG tablet Take 1 tablet (40 mg total) by mouth daily.  . metoprolol succinate (TOPROL-XL) 25 MG 24 hr tablet Take 1 tablet (25 mg total) by mouth daily.  . montelukast (SINGULAIR) 10 MG tablet Take 10 mg by mouth daily.  . nitroGLYCERIN (NITROSTAT) 0.4 MG SL tablet Place 1 tablet (0.4 mg total) under the tongue every 5 (five) minutes as needed for chest pain.  Marland Kitchen omeprazole (PRILOSEC) 20 MG capsule Take 20 mg by mouth daily.  . pantoprazole (PROTONIX) 40 MG tablet Take 1 tablet (40 mg total) by mouth daily.  . potassium chloride (K-DUR,KLOR-CON) 10 MEQ tablet Take 1 tablet (10 mEq total) by mouth daily.  . rosuvastatin (CRESTOR) 20 MG tablet Take 1 tablet (20 mg total) by mouth daily.  . [DISCONTINUED] apixaban (ELIQUIS) 5 MG TABS tablet Take 1 tablet (5 mg total) by mouth 2 (two) times daily.  . [DISCONTINUED] diltiazem (DILACOR XR) 120 MG 24 hr capsule Take 1 capsule (120 mg total) by mouth daily.  . [DISCONTINUED] furosemide (LASIX) 40 MG tablet Take 1 tablet (40 mg total) by mouth daily.  . [DISCONTINUED] metoprolol succinate (TOPROL-XL) 25 MG 24 hr tablet TAKE 1 TABLET ONCE DAILY.  . [DISCONTINUED] potassium chloride (K-DUR,KLOR-CON) 10 MEQ tablet Take 1 tablet (10 mEq total) by mouth daily.  . [DISCONTINUED] rosuvastatin (CRESTOR) 20 MG tablet Take 1 tablet (20 mg total) by mouth daily.     Allergies:   Imdur [isosorbide dinitrate]; Augmentin [amoxicillin-pot clavulanate]; and Tape   Social History   Socioeconomic History  . Marital status: Legally Separated    Spouse name: None  . Number of children: None  . Years of education: None  . Highest education level: None  Social Needs  . Financial resource strain: None  . Food insecurity - worry: None  . Food insecurity - inability: None  . Transportation needs - medical: None  . Transportation needs - non-medical: None  Occupational History  . Occupation: Truck  Geophysicist/field seismologist  Tobacco Use  . Smoking status: Never Smoker  . Smokeless tobacco: Never Used  Substance and Sexual Activity  . Alcohol use: Yes  . Drug use: No  . Sexual activity: None  Other Topics Concern  . None  Social History Narrative  . None     Family History: The patient's family history includes CAD in his father; Heart attack in his unknown relative; Hyperlipidemia in his unknown relative. ROS:   Please see the history of present illness.     All other systems reviewed and are negative.  EKGs/Labs/Other Studies Reviewed:    Recent Labs: 08/21/2017: ALT 113; BUN 20; Creatinine, Ser 1.48; Hemoglobin 14.0; NT-Pro BNP 23; Platelets 83; Potassium 4.2; Sodium 145; TSH 3.020  Recent Lipid Panel  Component Value Date/Time   CHOL 145 11/26/2016 0912   TRIG 92 11/26/2016 0912   HDL 53 11/26/2016 0912   CHOLHDL 2.7 11/26/2016 0912   VLDL 18 11/26/2016 0912   LDLCALC 74 11/26/2016 0912    Physical Exam:    VS:  BP 120/80 (BP Location: Left Arm, Patient Position: Sitting, Cuff Size: Large)   Pulse 61   Ht _0  (1.803 m)   Wt (!) 356 lb (161.5 kg)   SpO2 94%   BMI 49.65 kg/m     Wt Readings from Last 3 Encounters:  02/08/18 (!) 356 lb (161.5 kg)  08/21/17 (!) 344 lb (156 kg)  05/20/17 (!) 336 lb (152.4 kg)     GEN: Obese ,, well developed in no acute distress HEENT: Normal NECK: No JVD; No carotid bruits LYMPHATICS: No lymphadenopathy CARDIAC:,RRR, no murmurs, rubs, gallops RESPIRATORY:  Clear to auscultation without rales, wheezing or rhonchi  ABDOMEN: Soft, non-tender, non-distended MUSCULOSKELETAL: mild non-pitting B/L edema; No deformity  SKIN: Warm and dry NEUROLOGIC:  Alert and oriented x 3 PSYCHIATRIC:  Normal affect   ASSESSMENT:    1. Coronary artery disease involving native coronary artery of native heart without angina pectoris   2. Essential hypertension   3. Other hyperlipidemia    PLAN:    In order of problems listed above:  1. CAD, s/p  CABG x1, LIMA to LAD in January 2017. Repeat In May 2018 showed stable findings, will continue aspirin, metoprolol, Crestor. He is asymptomatic.  2. Paroxsymal Atrial Flutter: s/p recent DCCV 02/26/16. Maintaining NSR. He is on anticoagulation with Eliquis and has no bleeding.  3. CAD s/p CABG x1V (12/2015) : As above.   4. Acute on Acute on chronic Systolic HF: LVEF 16-10%, mild residual edema, partially lymhpedema sec to obesity, continue wearing compressions socks, limit salt, refuses to take extra lasix.  5. OSA - sleep significantly improved on CPAP machine.  6. Morbid obesity with recent weight gain, the patient has no insight into the problem states that he eats healthy, however eats out a lot and absolutely not active.  7. Elevated LFTs - we will repeat today as well as lipids, possibly also sec to liver steatosis.   Medication Adjustments/Labs and Tests Ordered: Current medicines are reviewed at length with the patient today.  Concerns regarding medicines are outlined above.  Orders Placed This Encounter  Procedures  . Comp Met (CMET)    Signed, Sean Dawley, MD  02/08/2018 5:14 PM    [facility]

## 2018-02-09 ENCOUNTER — Telehealth: Payer: Self-pay | Admitting: *Deleted

## 2018-02-09 DIAGNOSIS — R7401 Elevation of levels of liver transaminase levels: Secondary | ICD-10-CM | POA: Insufficient documentation

## 2018-02-09 DIAGNOSIS — R740 Nonspecific elevation of levels of transaminase and lactic acid dehydrogenase [LDH]: Secondary | ICD-10-CM

## 2018-02-09 LAB — COMPREHENSIVE METABOLIC PANEL
ALT: 124 IU/L — ABNORMAL HIGH (ref 0–44)
AST: 65 IU/L — ABNORMAL HIGH (ref 0–40)
Albumin/Globulin Ratio: 1.2 (ref 1.2–2.2)
Albumin: 3.7 g/dL (ref 3.5–5.5)
Alkaline Phosphatase: 394 IU/L — ABNORMAL HIGH (ref 39–117)
BUN/Creatinine Ratio: 15 (ref 9–20)
BUN: 24 mg/dL (ref 6–24)
Bilirubin Total: 0.6 mg/dL (ref 0.0–1.2)
CO2: 20 mmol/L (ref 20–29)
Calcium: 9.2 mg/dL (ref 8.7–10.2)
Chloride: 104 mmol/L (ref 96–106)
Creatinine, Ser: 1.65 mg/dL — ABNORMAL HIGH (ref 0.76–1.27)
GFR calc Af Amer: 57 mL/min/{1.73_m2} — ABNORMAL LOW (ref >59)
GFR calc non Af Amer: 49 mL/min/{1.73_m2} — ABNORMAL LOW (ref >59)
Globulin, Total: 3.2 g/dL (ref 1.5–4.5)
Glucose: 102 mg/dL — ABNORMAL HIGH (ref 65–99)
Potassium: 4.5 mmol/L (ref 3.5–5.2)
Sodium: 145 mmol/L — ABNORMAL HIGH (ref 134–144)
Total Protein: 6.9 g/dL (ref 6.0–8.5)

## 2018-02-09 NOTE — Telephone Encounter (Signed)
Spoke with the pt and informed him that per Dr Meda Coffee, his CMET came back, and his liver enzymes were elevated, so she recommends that he have a liver US done.  Clarified this order with Dr Meda Coffee, and this should be ordered as Abdominal US complete, and in comment section, for "liver US." Informed the pt that I will place the order in the system, and send our [facility] schedulers a message to call him back to have this test scheduled.  Pt verbalized understanding and agrees with this plan.

## 2018-02-09 NOTE — Telephone Encounter (Signed)
-----   Message from Dorothy Spark, MD sent at 02/09/2018 10:05 AM EDT ----- Please schedule a an abdominal (liver) [place], thank you

## 2018-02-10 ENCOUNTER — Ambulatory Visit: Payer: BLUE CROSS/BLUE SHIELD | Admitting: Cardiology

## 2018-02-17 ENCOUNTER — Ambulatory Visit (HOSPITAL_COMMUNITY)
Admission: RE | Admit: 2018-02-17 | Discharge: 2018-02-17 | Disposition: A | Discharge status: Home / Self Care | Payer: BLUE CROSS/BLUE SHIELD | Source: Ambulatory Visit | Attending: Cardiology | Admitting: Cardiology

## 2018-02-17 DIAGNOSIS — I77811 Abdominal aortic ectasia: Secondary | ICD-10-CM | POA: Insufficient documentation

## 2018-02-17 DIAGNOSIS — R740 Nonspecific elevation of levels of transaminase and lactic acid dehydrogenase [LDH]: Secondary | ICD-10-CM | POA: Diagnosis not present

## 2018-02-17 DIAGNOSIS — K760 Fatty (change of) liver, not elsewhere classified: Secondary | ICD-10-CM | POA: Insufficient documentation

## 2018-02-17 DIAGNOSIS — R7401 Elevation of levels of liver transaminase levels: Secondary | ICD-10-CM

## 2018-02-22 ENCOUNTER — Other Ambulatory Visit: Payer: Self-pay | Admitting: Cardiology

## 2018-06-21 ENCOUNTER — Other Ambulatory Visit: Payer: Self-pay | Admitting: Cardiology

## 2018-06-21 DIAGNOSIS — R079 Chest pain, unspecified: Secondary | ICD-10-CM

## 2018-06-21 DIAGNOSIS — I2510 Atherosclerotic heart disease of native coronary artery without angina pectoris: Secondary | ICD-10-CM

## 2018-06-21 DIAGNOSIS — E7849 Other hyperlipidemia: Secondary | ICD-10-CM

## 2018-06-21 DIAGNOSIS — I10 Essential (primary) hypertension: Secondary | ICD-10-CM

## 2018-08-21 ENCOUNTER — Other Ambulatory Visit: Payer: Self-pay | Admitting: Cardiology

## 2018-08-21 DIAGNOSIS — E7849 Other hyperlipidemia: Secondary | ICD-10-CM

## 2018-08-21 DIAGNOSIS — I2510 Atherosclerotic heart disease of native coronary artery without angina pectoris: Secondary | ICD-10-CM

## 2018-08-21 DIAGNOSIS — I10 Essential (primary) hypertension: Secondary | ICD-10-CM

## 2018-08-23 NOTE — Telephone Encounter (Signed)
Eliquis 5mg  refill request received; pt is 47 yrs old, wt-161.5kg, Crea-1.65 on 02/08/18, last seen by Dr. Meda Coffee on 02/08/18; will send in refill to requested pharmacy.

## 2018-08-30 ENCOUNTER — Other Ambulatory Visit: Payer: Self-pay | Admitting: Cardiology

## 2018-11-26 DIAGNOSIS — Z7901 Long term (current) use of anticoagulants: Secondary | ICD-10-CM

## 2018-11-26 DIAGNOSIS — I4891 Unspecified atrial fibrillation: Secondary | ICD-10-CM | POA: Diagnosis not present

## 2018-11-26 DIAGNOSIS — D473 Essential (hemorrhagic) thrombocythemia: Secondary | ICD-10-CM

## 2018-12-08 ENCOUNTER — Encounter: Payer: Self-pay | Admitting: Cardiology

## 2018-12-08 ENCOUNTER — Encounter

## 2018-12-08 ENCOUNTER — Ambulatory Visit: Payer: BLUE CROSS/BLUE SHIELD | Admitting: Cardiology

## 2018-12-08 VITALS — BP 106/74 | HR 63 | Ht 71.0 in | Wt 358.2 lb

## 2018-12-08 DIAGNOSIS — I10 Essential (primary) hypertension: Secondary | ICD-10-CM | POA: Diagnosis not present

## 2018-12-08 DIAGNOSIS — E7849 Other hyperlipidemia: Secondary | ICD-10-CM | POA: Diagnosis not present

## 2018-12-08 DIAGNOSIS — R7401 Elevation of levels of liver transaminase levels: Secondary | ICD-10-CM

## 2018-12-08 DIAGNOSIS — R740 Nonspecific elevation of levels of transaminase and lactic acid dehydrogenase [LDH]: Secondary | ICD-10-CM | POA: Diagnosis not present

## 2018-12-08 DIAGNOSIS — I2510 Atherosclerotic heart disease of native coronary artery without angina pectoris: Secondary | ICD-10-CM | POA: Diagnosis not present

## 2018-12-08 NOTE — Patient Instructions (Signed)
Medication Instructions:   STOP TAKING ASPIRIN NOW     Labwork:  TODAY--CMET, CBC W DIFF, AND TSH     Follow-Up:  Your physician wants you to follow-up in: [place] will receive a reminder letter in the mail two months in advance. If you don't receive a letter, please call our office to schedule the follow-up appointment.        If you need a refill on your cardiac medications before your next appointment, please call your pharmacy.

## 2018-12-08 NOTE — Progress Notes (Signed)
Cardiology Office Note:    Date:  12/08/2018   ID:  MALEIK [name], DOB October 23, 1971, MRN [id number]  PCP:  Raina Mina., MD  Cardiologist:  Ena Dawley, MD    Referring MD: Raina Mina., MD   Reason for visit: 1 year follow up  History of Present Illness:    Sean [name] is a 48 y.o. male with a hx of morbid obesity, hypertension, hyperlipidemia, CAD, s/p CABG CABG, LIMA to LAD in 12/2015.  He had recurrent chest pain but cardiac cath in May 2018 with stable CAD.   Hx of a fib/flutter on Eliquis.  Hx of DCCV.  But is maintaining SR.    12/08/2018 - 1 year follow up, the patient continues to work as Administrator, now locally. He denies any chest pain or SOB at rest, no LE edema, orthopnea, PND. He is completely inactive, doesn't walk or exercise and continues to gain weight. No limits of restrictions on diet. Takes his meds with no side effects.   Past Medical History:  Diagnosis Date  . Abnormal liver enzymes    a. Sees a doctor in [place].  . Cardiac cirrhosis    a. possible elevated LFTs/low platelets felt due to cardiac cirrhosis per 2017 admission.  . Chronic combined systolic and diastolic CHF (congestive heart failure) ([place])   . CKD (chronic kidney disease), stage II   . Congenital heart defect    a. rightward rotation of heart, almost dextrocardia  . Coronary artery disease    a. s/p CABGx1 in 12/2015.  Marland Kitchen Hematuria    a. Chronic hx of this, no prior etiology determined through workup per patient.  . Hypercholesteremia    a. Prev taken off statin due to abnormal liver function.  . Hypertension   . Morbid obesity ([place])   . Paroxysmal atrial flutter ([place]) 02/12/2016  . 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 Sinus bradycardia   . Thrombocytopenia ([place])     Past Surgical History:  Procedure Laterality Date  . APPENDECTOMY    . CARDIAC CATHETERIZATION N/A 12/24/2015   Procedure: Right/Left Heart Cath and Coronary Angiography;  Surgeon: Jolaine Artist, MD;  Location: [place] [facility];  Service: Cardiovascular;  Laterality: N/A;  . CARDIOVERSION N/A 02/14/2016   Procedure: CARDIOVERSION;  Surgeon: Pixie Casino, MD;  Location: [facility] ENDOSCOPY;  Service: Cardiovascular;  Laterality: N/A;  . CORONARY ARTERY BYPASS GRAFT N/A 12/26/2015   Procedure: Off Pump Coronary Artery Bypass Grafting times one using left internal mammary artery;  Surgeon: Rexene Alberts, MD;  Location: [place];  Service: Open Heart Surgery;  Laterality: N/A;  . GALLBLADDER SURGERY    . HERNIA REPAIR    . LEFT HEART CATH AND CORS/GRAFTS ANGIOGRAPHY N/A 04/15/2017   Procedure: Left Heart Cath and Cors/Grafts Angiography;  Surgeon: Troy Sine, MD;  Location: [place] [facility];  Service: Cardiovascular;  Laterality: N/A;  . TEE WITHOUT CARDIOVERSION N/A 12/26/2015   Procedure: TRANSESOPHAGEAL ECHOCARDIOGRAM (TEE);  Surgeon: Rexene Alberts, MD;  Location: [place];  Service: Open Heart Surgery;  Laterality: N/A;  . TEE WITHOUT CARDIOVERSION N/A 02/14/2016   Procedure: TRANSESOPHAGEAL ECHOCARDIOGRAM (TEE);  Surgeon: Pixie Casino, MD;  Location: [facility] ENDOSCOPY;  Service: Cardiovascular;  Laterality: N/A;    Current Medications: Current Meds  Medication Sig  . allopurinol (ZYLOPRIM) 100 MG tablet Take 1 tablet (100 mg total) by mouth daily.  Marland Kitchen diltiazem (DILACOR XR) 120  MG 24 hr capsule Take 1 capsule (120 mg total) by mouth daily.  Marland Kitchen ELIQUIS 5 MG TABS tablet TAKE 1 TABLET BY MOUTH TWICE DAILY.  . Famotidine (PEPCID PO) Take 1 tablet by mouth as needed.  . furosemide (LASIX) 40 MG tablet TAKE 1 TABLET ONCE DAILY.  . metoprolol succinate (TOPROL-XL) 25 MG 24 hr tablet Take 1 tablet (25 mg total) by mouth daily.  . montelukast (SINGULAIR) 10 MG tablet Take 10 mg by mouth daily.  . nitroGLYCERIN (NITROSTAT) 0.4 MG SL tablet Place 1 tablet (0.4 mg total) under the tongue every 5 (five) minutes as needed for chest pain.  Marland Kitchen omeprazole (PRILOSEC) 20 MG capsule Take 20 mg by  mouth daily.  . pantoprazole (PROTONIX) 40 MG tablet TAKE 1 TABLET EVERY MORNING.  Marland Kitchen potassium chloride (K-DUR,KLOR-CON) 10 MEQ tablet TAKE 1 TABLET ONCE DAILY.  . rosuvastatin (CRESTOR) 20 MG tablet Take 1 tablet (20 mg total) by mouth daily.  . [DISCONTINUED] aspirin 81 MG tablet Take 1 tablet (81 mg total) by mouth daily.     Allergies:   Imdur [isosorbide dinitrate]; Augmentin [amoxicillin-pot clavulanate]; and Tape   Social History   Socioeconomic History  . Marital status: Legally Separated    Spouse name: Not on file  . Number of children: Not on file  . Years of education: Not on file  . Highest education level: Not on file  Occupational History  . Occupation: Truck Diplomatic Services operational officer  . Financial resource strain: Not on file  . Food insecurity:    Worry: Not on file    Inability: Not on file  . Transportation needs:    Medical: Not on file    Non-medical: Not on file  Tobacco Use  . Smoking status: Never Smoker  . Smokeless tobacco: Never Used  Substance and Sexual Activity  . Alcohol use: Yes  . Drug use: No  . Sexual activity: Not on file  Lifestyle  . Physical activity:    Days per week: Not on file    Minutes per session: Not on file  . Stress: Not on file  Relationships  . Social connections:    Talks on phone: Not on file    Gets together: Not on file    Attends religious service: Not on file    Active member of club or organization: Not on file    Attends meetings of clubs or organizations: Not on file    Relationship status: Not on file  Other Topics Concern  . Not on file  Social History Narrative  . Not on file     Family History: The patient's family history includes CAD in his father; Heart attack in an other family member; Hyperlipidemia in an other family member. ROS:   Please see the history of present illness.     All other systems reviewed and are negative.  EKGs/Labs/Other Studies Reviewed:    Recent Labs: 02/08/2018: ALT 124;  BUN 24; Creatinine, Ser 1.65; Potassium 4.5; Sodium 145  Recent Lipid Panel    Component Value Date/Time   CHOL 145 11/26/2016 0912   TRIG 92 11/26/2016 0912   HDL 53 11/26/2016 0912   CHOLHDL 2.7 11/26/2016 0912   VLDL 18 11/26/2016 0912   LDLCALC 74 11/26/2016 0912    Physical Exam:    VS:  BP 106/74   Pulse 63   Ht '5\' 11"'  (1.803 m)   Wt (!) 358 lb 3.2 oz (162.5 kg)   SpO2 94%  BMI 49.96 kg/m     Wt Readings from Last 3 Encounters:  12/08/18 (!) 358 lb 3.2 oz (162.5 kg)  02/08/18 (!) 356 lb (161.5 kg)  08/21/17 (!) 344 lb (156 kg)     GEN: Obese ,, well developed in no acute distress HEENT: Normal NECK: No JVD; No carotid bruits LYMPHATICS: No lymphadenopathy CARDIAC:,RRR, no murmurs, rubs, gallops RESPIRATORY:  Clear to auscultation without rales, wheezing or rhonchi  ABDOMEN: Soft, non-tender, non-distended MUSCULOSKELETAL: mild non-pitting B/L edema; No deformity  SKIN: Warm and dry NEUROLOGIC:  Alert and oriented x 3 PSYCHIATRIC:  Normal affect   ASSESSMENT:    1. Coronary artery disease involving native coronary artery of native heart without angina pectoris   2. Elevated AST (SGOT)   3. Essential hypertension   4. Other hyperlipidemia    PLAN:    In order of problems listed above:  1. CAD, s/p CABG x1, LIMA to LAD in January 2017. Repeat In May 2018 showed stable findings, will continue metoprolol, Crestor. Discontinue aspirin as he is on Eliquis and bruises easily. He is asymptomatic. No ischemic work up.  2. Paroxsymal Atrial Flutter: s/p recent DCCV 02/26/16. Maintaining NSR. He is on anticoagulation with Eliquis and has no bleeding.  3. CAD s/p CABG x1V (12/2015) : As above.   4. Acute on Acute on chronic Systolic HF: LVEF 86-38%, minimal edema mostly sec to obesity, inactivity, diet non-compliance.   5. OSA - sleep significantly improved on CPAP machine.  6. Morbid obesity with ongoing weight gain, the patient has no insight into the problem  states that he eats healthy, however eats out a lot and absolutely not active.  7. Elevated LFTs - we will repeat today as well as lipids, possibly also sec to liver steatosis.   Follow up in 6 months.  Of note, the patient showed inappropriate behaviour toward our nurse. In the future we will request another nurse to work with him. If this is a repeated behaviour we will discharge him from the clinic.   Medication Adjustments/Labs and Tests Ordered: Current medicines are reviewed at length with the patient today.  Concerns regarding medicines are outlined above.  Orders Placed This Encounter  Procedures  . Comp Met (CMET)  . CBC w/Diff  . TSH  . EKG 12-Lead   Signed, Ena Dawley, MD  12/08/2018 5:46 PM    [facility]

## 2018-12-09 ENCOUNTER — Telehealth: Payer: Self-pay | Admitting: *Deleted

## 2018-12-09 DIAGNOSIS — I10 Essential (primary) hypertension: Secondary | ICD-10-CM

## 2018-12-09 DIAGNOSIS — R7989 Other specified abnormal findings of blood chemistry: Secondary | ICD-10-CM

## 2018-12-09 DIAGNOSIS — I5042 Chronic combined systolic (congestive) and diastolic (congestive) heart failure: Secondary | ICD-10-CM

## 2018-12-09 DIAGNOSIS — Z789 Other specified health status: Secondary | ICD-10-CM

## 2018-12-09 DIAGNOSIS — Z951 Presence of aortocoronary bypass graft: Secondary | ICD-10-CM

## 2018-12-09 DIAGNOSIS — N183 Chronic kidney disease, stage 3 unspecified: Secondary | ICD-10-CM

## 2018-12-09 DIAGNOSIS — I2510 Atherosclerotic heart disease of native coronary artery without angina pectoris: Secondary | ICD-10-CM

## 2018-12-09 DIAGNOSIS — R799 Abnormal finding of blood chemistry, unspecified: Secondary | ICD-10-CM

## 2018-12-09 LAB — COMPREHENSIVE METABOLIC PANEL
ALT: 160 IU/L — ABNORMAL HIGH (ref 0–44)
AST: 130 IU/L — ABNORMAL HIGH (ref 0–40)
Albumin/Globulin Ratio: 1.4 (ref 1.2–2.2)
Albumin: 3.9 g/dL (ref 3.5–5.5)
Alkaline Phosphatase: 421 IU/L — ABNORMAL HIGH (ref 39–117)
BUN/Creatinine Ratio: 14 (ref 9–20)
BUN: 24 mg/dL (ref 6–24)
Bilirubin Total: 0.8 mg/dL (ref 0.0–1.2)
CO2: 23 mmol/L (ref 20–29)
Calcium: 9.3 mg/dL (ref 8.7–10.2)
Chloride: 106 mmol/L (ref 96–106)
Creatinine, Ser: 1.68 mg/dL — ABNORMAL HIGH (ref 0.76–1.27)
GFR calc Af Amer: 56 mL/min/{1.73_m2} — ABNORMAL LOW (ref >59)
GFR calc non Af Amer: 48 mL/min/{1.73_m2} — ABNORMAL LOW (ref >59)
Globulin, Total: 2.8 g/dL (ref 1.5–4.5)
Glucose: 113 mg/dL — ABNORMAL HIGH (ref 65–99)
Potassium: 4.4 mmol/L (ref 3.5–5.2)
Sodium: 145 mmol/L — ABNORMAL HIGH (ref 134–144)
Total Protein: 6.7 g/dL (ref 6.0–8.5)

## 2018-12-09 LAB — CBC WITH DIFFERENTIAL/PLATELET
Basophils Absolute: 0.1 10*3/uL (ref 0.0–0.2)
Basos: 1 %
EOS (ABSOLUTE): 0.3 10*3/uL (ref 0.0–0.4)
Eos: 5 %
Hematocrit: 44.6 % (ref 37.5–51.0)
Hemoglobin: 15.3 g/dL (ref 13.0–17.7)
Immature Grans (Abs): 0 10*3/uL (ref 0.0–0.1)
Immature Granulocytes: 1 %
Lymphocytes Absolute: 1.9 10*3/uL (ref 0.7–3.1)
Lymphs: 29 %
MCH: 32.3 pg (ref 26.6–33.0)
MCHC: 34.3 g/dL (ref 31.5–35.7)
MCV: 94 fL (ref 79–97)
Monocytes Absolute: 1 10*3/uL — ABNORMAL HIGH (ref 0.1–0.9)
Monocytes: 14 %
Neutrophils Absolute: 3.5 10*3/uL (ref 1.4–7.0)
Neutrophils: 50 %
Platelets: 89 10*3/uL — CL (ref 150–450)
RBC: 4.74 x10E6/uL (ref 4.14–5.80)
RDW: 13 % (ref 11.6–15.4)
WBC: 6.7 10*3/uL (ref 3.4–10.8)

## 2018-12-09 LAB — TSH: TSH: 2.85 u[IU]/mL (ref 0.450–4.500)

## 2018-12-09 NOTE — Telephone Encounter (Signed)
-----   Message from Dorothy Spark, MD sent at 12/09/2018 12:16 PM EST ----- Elevated LFTs and creatinine, hold Lasix, hold rosuvastatin, please refer him to lipid clinic for PCSK9 inhibitors, also strongly advised to lose at least 50 pounds.

## 2018-12-09 NOTE — Telephone Encounter (Signed)
Walking, avoid fried food, and salt, take lasix every other day

## 2018-12-09 NOTE — Telephone Encounter (Signed)
Spoke with the pt and endorsed to him, his lab results and recommendations per Dr Meda Coffee.  Advised the pt to hold his lasix and rosuvastatin, and we will refer him to lipid clinic for consideration of PCSK9-inhibitors. Advised the pt on weight loss management.  Informed the pt that I will place the referral in the system and send a message to our [facility] schedulers to call him back and arrange his lipid clinic appt.  Per the pt, he wanted me to ask Dr Meda Coffee what he should take or do while being off of lasix.  Pt states when he is off lasix for a day or two, he experiences increased lower extremity edema and sob.  Advised the pt to maintain a low sodium diet, to help with swelling to his extremities. Informed the pt that I will route this message to Dr Meda Coffee to advise on, and will follow-up with him shortly thereafter. Pt verbalized understanding and agrees with this plan.

## 2018-12-10 MED ORDER — FUROSEMIDE 40 MG PO TABS: 40.0000 mg | ORAL_TABLET | ORAL | 2 refills | Status: DC

## 2018-12-10 NOTE — Telephone Encounter (Signed)
Spoke back with the pt and informed him that per Dr Meda Coffee, she recommends that he start walking, avoid fried foods and salt intake, and he may take his lasix 40 mg po every other day.  Pt verbalized understanding and agrees with this plan.   Off note:  When calling the pt back and informing him of recommendations, he began making inappropriate comments about wanting me to come to his house to cook him food, and calling me names like darling, and making offensive statements.  Allowed the pt to finish talking then reminded him that when he speaks to me, he should avoid making inappropriate comments, for this makes me feel very uncomfortable.  Pt verbalized understanding.

## 2018-12-27 ENCOUNTER — Ambulatory Visit: Payer: BLUE CROSS/BLUE SHIELD

## 2019-01-06 ENCOUNTER — Telehealth: Payer: Self-pay

## 2019-01-06 ENCOUNTER — Other Ambulatory Visit: Payer: Self-pay

## 2019-01-06 DIAGNOSIS — E785 Hyperlipidemia, unspecified: Secondary | ICD-10-CM

## 2019-01-06 NOTE — Telephone Encounter (Signed)
Called to schedule labs for lipid appt tomorrow will route to pharmd

## 2019-01-07 ENCOUNTER — Ambulatory Visit (INDEPENDENT_AMBULATORY_CARE_PROVIDER_SITE_OTHER): Payer: BLUE CROSS/BLUE SHIELD | Admitting: Pharmacist

## 2019-01-07 ENCOUNTER — Other Ambulatory Visit: Payer: BLUE CROSS/BLUE SHIELD | Admitting: *Deleted

## 2019-01-07 DIAGNOSIS — E7849 Other hyperlipidemia: Secondary | ICD-10-CM

## 2019-01-07 DIAGNOSIS — E785 Hyperlipidemia, unspecified: Secondary | ICD-10-CM

## 2019-01-07 NOTE — Progress Notes (Signed)
Patient ID: Sean [name]                 DOB: 1971-06-25                    MRN: [id number]     HPI: Sean [name] is a 48 y.o. male patient referred to lipid clinic by Dr. Meda Coffee. PMH is significant for morbid obesity, hypertension, hyperlipidemia, CAD, s/p CABG CABG, LIMA to LAD in 12/2015, afib/flutter and liver steatosis. Patient was recently taken off of his rosuvastatin due to elevation of liver enzymes > 3x ULN.   Patient patient has been off of rosuvastatin for about 4 weeks now. LFT and lipid panel was drawn today prior to appointment. Not yet resulted. Patient does have a history of fatty liver, diagnosed about 10 years ago by Dr. Melina Copa in [place] per patient report.   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  Social History: ocassional ETOH, neg tobacco  Labs:none available  Past Medical History:  Diagnosis Date  . Abnormal liver enzymes    a. Sees a doctor in [place].  . Cardiac cirrhosis    a. possible elevated LFTs/low platelets felt due to cardiac cirrhosis per 2017 admission.  . Chronic combined systolic and diastolic CHF (congestive heart failure) ([place])   . CKD (chronic kidney disease), stage II   . Congenital heart defect    a. rightward rotation of heart, almost dextrocardia  . Coronary artery disease    a. s/p CABGx1 in 12/2015.  Marland Kitchen Hematuria    a. Chronic hx of this, no prior etiology determined through workup per patient.  . Hypercholesteremia    a. Prev taken off statin due to abnormal liver function.  . Hypertension   . Morbid obesity ([place])   . Paroxysmal atrial flutter ([place])  02/12/2016  . 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 Sinus bradycardia   . Thrombocytopenia ([place])     Current Outpatient Medications on File Prior to Visit  Medication Sig Dispense Refill  . allopurinol (ZYLOPRIM) 100 MG tablet Take 1 tablet (100 mg total) by mouth daily. 30 tablet 1  . diltiazem (DILACOR XR) 120 MG 24 hr capsule Take 1 capsule (120 mg total) by mouth daily. 90 capsule 3  . ELIQUIS 5 MG TABS tablet TAKE 1 TABLET BY MOUTH TWICE DAILY. 60 tablet 5  . Famotidine (PEPCID PO) Take 1 tablet by mouth as needed.    . furosemide (LASIX) 40 MG tablet Take 1 tablet (40 mg total) by mouth every other day. 30 tablet 2  . metoprolol succinate (TOPROL-XL) 25 MG 24 hr tablet Take 1 tablet (25 mg total) by mouth daily. 90 tablet 2  . montelukast (SINGULAIR) 10 MG tablet Take 10 mg by mouth daily.    . nitroGLYCERIN (NITROSTAT) 0.4 MG SL tablet Place 1 tablet (0.4 mg total) under the tongue every 5 (five) minutes as needed for chest pain. 25 tablet 12  . omeprazole (PRILOSEC) 20 MG capsule Take 20 mg by mouth daily.    . pantoprazole (PROTONIX) 40 MG tablet TAKE 1 TABLET EVERY  MORNING. 30 tablet 8  . potassium chloride (K-DUR,KLOR-CON) 10 MEQ tablet TAKE 1 TABLET ONCE DAILY. 30 tablet 8   No current facility-administered medications on file prior to visit.     Allergies  Allergen Reactions  . Imdur [Isosorbide Dinitrate] Diarrhea    Pt states causes "diarrhea."  . Augmentin [Amoxicillin-Pot Clavulanate] Itching  . Tape Rash    Assessment/Plan:  1. Hyperlipidemia - Discussed PCSK9 inhibitors. Reviewed place in therapy, LDL lowering, injection technique, PA process and cost. Patient is agreeable to start medication if needed. Will wait to see results of labs today. If LFT do not improve, elevation may have not been from statin. I will call patient when labs result and we will discuss next options, which most likely will be starting PCSK9 inhibitor.   Thank you,  Ramond Dial, Pharm.D, [facility]  [address] [place],  [address]  Phone: [phone]; Fax: [phone]

## 2019-01-07 NOTE — Patient Instructions (Signed)
Work on starting an exercise goal. Start with a 10 min walk two days a week. Increase as tolerated. Goal is 30 min 5 days a week.  Try switching from whole milk to 2% Milk, limiting fried foods  I will call you with your lab results next week.   Call us at [phone] with any questions or concerns.

## 2019-01-08 LAB — HEPATIC FUNCTION PANEL
ALT: 77 IU/L — ABNORMAL HIGH (ref 0–44)
AST: 63 IU/L — ABNORMAL HIGH (ref 0–40)
Albumin: 3.6 g/dL — ABNORMAL LOW (ref 4.0–5.0)
Alkaline Phosphatase: 307 IU/L — ABNORMAL HIGH (ref 39–117)
Bilirubin Total: 0.6 mg/dL (ref 0.0–1.2)
Bilirubin, Direct: 0.26 mg/dL (ref 0.00–0.40)
Total Protein: 6.3 g/dL (ref 6.0–8.5)

## 2019-01-08 LAB — LIPID PANEL
Chol/HDL Ratio: 4.2 ratio (ref 0.0–5.0)
Cholesterol, Total: 193 mg/dL (ref 100–199)
HDL: 46 mg/dL (ref >39)
LDL Calculated: 118 mg/dL — ABNORMAL HIGH (ref 0–99)
Triglycerides: 144 mg/dL (ref 0–149)
VLDL Cholesterol Cal: 29 mg/dL (ref 5–40)

## 2019-01-10 ENCOUNTER — Telehealth: Payer: Self-pay | Admitting: Pharmacist

## 2019-01-10 NOTE — Telephone Encounter (Signed)
Patient made aware that his liver enzymes are improving. LDL above goal, however he is off therapy. PA for Repatha has been submitted. Awaiting response from insurance company. Will contact patient once determination is made.

## 2019-01-12 MED ORDER — EVOLOCUMAB 140 MG/ML ~~LOC~~ SOAJ: 1.0000 "pen " | SUBCUTANEOUS | 11 refills | Status: DC

## 2019-01-12 NOTE — Addendum Note (Signed)
Addended by: Marcelle Overlie D on: 01/12/2019 04:46 PM   Modules accepted: Orders

## 2019-01-12 NOTE — Telephone Encounter (Signed)
Called patient and let him know that his PA for Repatha was approved. I called Rx into his pharmacy, [facility], and I activated a copay card for him. However when I went to call in the copay card pharmacy told me that their wholsaler did not have any instock right now. They were trying to contact patient to let him know but voicemail not working. I also tried to call patient back and offer to call into a different pharmacy but no answer. Will try again tomorrow  Copay card into  [facility] [id number] [facility] [id number] ID [id number]

## 2019-01-13 MED ORDER — EVOLOCUMAB 140 MG/ML ~~LOC~~ SOAJ: 1.0000 "pen " | SUBCUTANEOUS | 11 refills | Status: DC

## 2019-01-13 NOTE — Telephone Encounter (Signed)
Spoke with patient- requested rx be sent to [facility] in [place]- rx [employer] and copay card- $5 copay

## 2019-01-13 NOTE — Addendum Note (Signed)
Addended by: Marcelle Overlie D on: 01/13/2019 09:46 AM   Modules accepted: Orders

## 2019-02-01 ENCOUNTER — Other Ambulatory Visit: Payer: Self-pay | Admitting: Cardiology

## 2019-02-01 DIAGNOSIS — I10 Essential (primary) hypertension: Secondary | ICD-10-CM

## 2019-02-01 DIAGNOSIS — I2510 Atherosclerotic heart disease of native coronary artery without angina pectoris: Secondary | ICD-10-CM

## 2019-02-01 DIAGNOSIS — E7849 Other hyperlipidemia: Secondary | ICD-10-CM

## 2019-02-11 ENCOUNTER — Telehealth: Payer: Self-pay | Admitting: Cardiology

## 2019-02-11 NOTE — Telephone Encounter (Signed)
Returned call to mother and unable to leave message as VMbox full. Should be ok to delay until Monday if unable to obtain at pharmacy. Pt or mother can call if additional issues obtaining medication.

## 2019-02-11 NOTE — Telephone Encounter (Signed)
Mom is trying to fill rx for Evolocumab (REPATHA SURECLICK) 628 MG/ML SOAJ. She called on Monday, and  the pharmacy told her that Insurance wou;dn't approve it (too early?), and to call back Friday. She called the Pharmacy today, and now the pharmacy is saying they can't fill it until Monday. They did not specify if it was another insurance issue or if the pharmacy just didn't have it in stock or not.  Mom's main concern is if waiting until Monday to administer the medication will be OK, or if she should try and check with other pharmacies to try and find it.  Please advise.

## 2019-02-11 NOTE — Telephone Encounter (Signed)
Spoke with pt and advised him that it is ok to take his Repatha shot 1 day later (typically injects on Sundays). He will return to his every other Sunday dosing after his Monday injection. He verbalized understanding.

## 2019-04-28 ENCOUNTER — Other Ambulatory Visit: Payer: Self-pay | Admitting: Cardiology

## 2019-04-28 DIAGNOSIS — E7849 Other hyperlipidemia: Secondary | ICD-10-CM

## 2019-04-28 DIAGNOSIS — I10 Essential (primary) hypertension: Secondary | ICD-10-CM

## 2019-04-28 DIAGNOSIS — I2510 Atherosclerotic heart disease of native coronary artery without angina pectoris: Secondary | ICD-10-CM

## 2019-08-01 ENCOUNTER — Other Ambulatory Visit: Payer: Self-pay | Admitting: Cardiology

## 2019-08-01 DIAGNOSIS — E7849 Other hyperlipidemia: Secondary | ICD-10-CM

## 2019-08-01 DIAGNOSIS — I2510 Atherosclerotic heart disease of native coronary artery without angina pectoris: Secondary | ICD-10-CM

## 2019-08-01 DIAGNOSIS — I10 Essential (primary) hypertension: Secondary | ICD-10-CM

## 2019-08-22 ENCOUNTER — Other Ambulatory Visit: Payer: Self-pay

## 2019-08-22 DIAGNOSIS — E7849 Other hyperlipidemia: Secondary | ICD-10-CM

## 2019-08-29 ENCOUNTER — Telehealth: Payer: Self-pay

## 2019-08-29 NOTE — Telephone Encounter (Signed)
Called and lmomed the pt stating that I scheduled lipid lab on the same day as Sean [name] and if that isn't desired to call us back and cancel I just wanted to make sure that he got on the schedule and I figured that it would be easier doing it while the pt is here for [name] w/dr Sean anyways

## 2019-09-05 DIAGNOSIS — E559 Vitamin D deficiency, unspecified: Secondary | ICD-10-CM | POA: Insufficient documentation

## 2019-09-05 DIAGNOSIS — M1000 Idiopathic gout, unspecified site: Secondary | ICD-10-CM | POA: Insufficient documentation

## 2019-09-05 DIAGNOSIS — R5381 Other malaise: Secondary | ICD-10-CM | POA: Insufficient documentation

## 2019-09-05 DIAGNOSIS — K219 Gastro-esophageal reflux disease without esophagitis: Secondary | ICD-10-CM | POA: Insufficient documentation

## 2019-09-05 DIAGNOSIS — M109 Gout, unspecified: Secondary | ICD-10-CM | POA: Insufficient documentation

## 2019-09-05 DIAGNOSIS — J301 Allergic rhinitis due to pollen: Secondary | ICD-10-CM | POA: Insufficient documentation

## 2019-09-13 ENCOUNTER — Telehealth: Payer: Self-pay | Admitting: *Deleted

## 2019-09-13 ENCOUNTER — Encounter

## 2019-09-13 NOTE — Telephone Encounter (Signed)
Patient agreed to Video Patient was at work unable to go over med's  until 6 or 7  at night .Patient was told that he can go over the morning of APPT...  Patient had lipid lab work tht he rescheduled for later that day to due to having another appointment close around that time to help with traveling    Virtual Visit Pre-Appointment Phone Call   I am calling you today to discuss your upcoming appointment. We are currently trying to limit exposure to the virus that causes COVID-19 by seeing patients at home rather than in the office."  1. "What is the BEST phone number to call the day of the visit?" - include this in appointment notes  2. "Do you have or have access to (through a family member/friend) a smartphone with video capability that we can use for your visit?" a. If yes - list this number in appt notes as "cell" (if different from BEST phone #) and list the appointment type as a VIDEO visit in appointment notes b. If no - list the appointment type as a PHONE visit in appointment notes  3. Confirm consent - "In the setting of the current Covid19 crisis, you are scheduled for a (phone or video) visit with your provider on (date) at (time).  Just as we do with many in-office visits, in order for you to participate in this visit, we must obtain consent.  If you'd like, I can send this to your mychart (if signed up) or email for you to review.  Otherwise, I can obtain your verbal consent now.  All virtual visits are billed to your insurance company just like a normal visit would be.  By agreeing to a virtual visit, we'd like you to understand that the technology does not allow for your provider to perform an examination, and thus may limit your provider's ability to fully assess your condition. If your provider identifies any concerns that need to be evaluated in person, we will make arrangements to do so.  Finally, though the technology is pretty good, we cannot assure that it will always work  on either your or our end, and in the setting of a video visit, we may have to convert it to a phone-only visit.  In either situation, we cannot ensure that we have a secure connection.  Are you willing to proceed?" STAFF: Did the patient verbally acknowledge consent to telehealth visit? Document YES/NO here:yes   4. Advise patient to be prepared - "Two hours prior to your appointment, go ahead and check your blood pressure, pulse, oxygen saturation, and your weight (if you have the equipment to check those) and write them all down. When your visit starts, your provider will ask you for this information. If you have an Apple Watch or Kardia device, please plan to have heart rate information ready on the day of your appointment. Please have a pen and paper handy nearby the day of the visit as well."  5. Give patient instructions for MyChart download to smartphone OR Doximity/Doxy.me as below if video visit (depending on what platform provider is using)  6. Inform patient they will receive a phone call 15 minutes prior to their appointment time (may be from unknown caller ID) so they should be prepared to answer    TELEPHONE CALL NOTE  TAYM TWIST has been deemed a candidate for a follow-up tele-health visit to limit community exposure during the Covid-19 pandemic. I spoke with the patient  via phone to ensure availability of phone/video source, confirm preferred email & phone number, and discuss instructions and expectations.  I reminded KAUAN KLOOSTERMAN to be prepared with any vital sign and/or heart rhythm information that could potentially be obtained via home monitoring, at the time of his visit. I reminded GIANN OBARA to expect a phone call prior to his visit.  Claude Manges, [place] 09/13/2019 12:41 PM   INSTRUCTIONS FOR DOWNLOADING THE MYCHART APP TO SMARTPHONE  - The patient must first make sure to have activated MyChart and know their login information - If Apple, go to [employer]  and type in MyChart in the search bar and download the app. If Android, ask patient to go to [employer] and type in [place] in the search bar and download the app. The app is free but as with any other app downloads, their phone may require them to verify saved payment information or Apple/Android password.  - The patient will need to then log into the app with their MyChart username and password, and select  as their healthcare provider to link the account. When it is time for your visit, go to the MyChart app, find appointments, and click Begin Video Visit. Be sure to Select Allow for your device to access the Microphone and Camera for your visit. You will then be connected, and your provider will be with you shortly.  **If they have any issues connecting, or need assistance please contact MyChart service desk [phone])**  **If using a computer, in order to ensure the best quality for their visit they will need to use either of the following Internet Browsers: [employer], or Google Chrome**  IF USING DOXIMITY or DOXY.ME - The patient will receive a link just prior to their visit by text.     FULL LENGTH CONSENT FOR TELE-HEALTH VISIT   I hereby voluntarily request, consent and authorize [facility] and its employed or contracted physicians, physician assistants, nurse practitioners or other licensed health care professionals (the Practitioner), to provide me with telemedicine health care services (the "Services") as deemed necessary by the treating Practitioner. I acknowledge and consent to receive the Services by the Practitioner via telemedicine. I understand that the telemedicine visit will involve communicating with the Practitioner through live audiovisual communication technology and the disclosure of certain medical information by electronic transmission. I acknowledge that I have been given the opportunity to request an in-person assessment or other  available alternative prior to the telemedicine visit and am voluntarily participating in the telemedicine visit.  I understand that I have the right to withhold or withdraw my consent to the use of telemedicine in the course of my care at any time, without affecting my right to future care or treatment, and that the Practitioner or I may terminate the telemedicine visit at any time. I understand that I have the right to inspect all information obtained and/or recorded in the course of the telemedicine visit and may receive copies of available information for a reasonable fee.  I understand that some of the potential risks of receiving the Services via telemedicine include:  Marland Kitchen Delay or interruption in medical evaluation due to technological equipment failure or disruption; . Information transmitted may not be sufficient (e.g. poor resolution of images) to allow for appropriate medical decision making by the Practitioner; and/or  . In rare instances, security protocols could fail, causing a breach of personal health information.  Furthermore, I acknowledge that it is my  responsibility to provide information about my medical history, conditions and care that is complete and accurate to the best of my ability. I acknowledge that Practitioner's advice, recommendations, and/or decision may be based on factors not within their control, such as incomplete or inaccurate data provided by me or distortions of diagnostic images or specimens that may result from electronic transmissions. I understand that the practice of medicine is not an exact science and that Practitioner makes no warranties or guarantees regarding treatment outcomes. I acknowledge that I will receive a copy of this consent concurrently upon execution via email to the email address I last provided but may also request a printed copy by calling the office of [facility].    I understand that my insurance will be billed for this visit.   I have  read or had this consent read to me. . I understand the contents of this consent, which adequately explains the benefits and risks of the Services being provided via telemedicine.  . I have been provided ample opportunity to ask questions regarding this consent and the Services and have had my questions answered to my satisfaction. . I give my informed consent for the services to be provided through the use of telemedicine in my medical care  By participating in this telemedicine visit I agree to the above.

## 2019-09-14 ENCOUNTER — Other Ambulatory Visit: Payer: BC Managed Care – PPO | Admitting: *Deleted

## 2019-09-14 ENCOUNTER — Encounter: Payer: Self-pay | Admitting: Cardiology

## 2019-09-14 ENCOUNTER — Encounter: Payer: Self-pay | Admitting: *Deleted

## 2019-09-14 ENCOUNTER — Other Ambulatory Visit: Payer: BLUE CROSS/BLUE SHIELD

## 2019-09-14 ENCOUNTER — Other Ambulatory Visit: Payer: Self-pay

## 2019-09-14 ENCOUNTER — Telehealth (INDEPENDENT_AMBULATORY_CARE_PROVIDER_SITE_OTHER): Payer: BC Managed Care – PPO | Admitting: Cardiology

## 2019-09-14 VITALS — BP 149/81 | HR 54 | Wt 365.0 lb

## 2019-09-14 DIAGNOSIS — I2510 Atherosclerotic heart disease of native coronary artery without angina pectoris: Secondary | ICD-10-CM

## 2019-09-14 DIAGNOSIS — I10 Essential (primary) hypertension: Secondary | ICD-10-CM

## 2019-09-14 DIAGNOSIS — I5042 Chronic combined systolic (congestive) and diastolic (congestive) heart failure: Secondary | ICD-10-CM

## 2019-09-14 DIAGNOSIS — Z789 Other specified health status: Secondary | ICD-10-CM

## 2019-09-14 NOTE — Patient Instructions (Addendum)
Medication Instructions:    Your physician recommends that you continue on your current medications as directed. Please refer to the Current Medication list given to you today.  If you need a refill on your cardiac medications before your next appointment, please call your pharmacy.    Lab work:  TODAY, AS PREVIOUSLY SCHEDULED AT 1:45 PM--WE WILL CHECK LIPIDS AND ADD ON TO YOUR CBC W DIFF, TSH, CMET, AND PRO-BNP  If you have labs (blood work) drawn today and your tests are completely normal, you will receive your results only by: Marland Kitchen MyChart Message (if you have MyChart) OR . A paper copy in the mail If you have any lab test that is abnormal or we need to change your treatment, we will call you to review the results.    Follow-Up: At [facility], you and your health needs are our priority.  As part of our continuing mission to provide you with exceptional heart care, we have created designated Provider Care Teams.  These Care Teams include your primary Cardiologist (physician) and Advanced Practice Providers (APPs -  Physician Assistants and Nurse Practitioners) who all work together to provide you with the care you need, when you need it.  Your physician wants you to follow-up in: [place] DR. Johann Capers will receive a reminder letter in the mail two months in advance. If you don't receive a letter, please call our office to schedule the follow-up appointment.  YOU CAN CALL IN 4 MONTHS TO THE OFFICE TO MAKE THIS APPOINTMENT.

## 2019-09-14 NOTE — Progress Notes (Signed)
Virtual Visit via Video Note   This visit type was conducted due to national recommendations for restrictions regarding the COVID-19 Pandemic (e.g. social distancing) in an effort to limit this patient's exposure and mitigate transmission in our community.  Due to his co-morbid illnesses, this patient is at least at moderate risk for complications without adequate follow up.  This format is felt to be most appropriate for this patient at this time.  All issues noted in this document were discussed and addressed.  A limited physical exam was performed with this format.  Please refer to the patient's chart for his consent to telehealth for [facility].   Date:  09/14/2019   ID:  Sean [name], DOB 10/11/1971, MRN [id number]  Patient Location: Home Provider Location: Home  PCP:  Raina Mina., MD  Cardiologist:  Ena Dawley, MD  Electrophysiologist:  None   Evaluation Performed:  Follow-Up Visit  Chief Complaint:  9 months follow up  History of Present Illness:    Sean [name] is a 48 y.o. male with hx of morbid obesity, hypertension, hyperlipidemia, CAD, s/p CABG CABG, LIMA to LAD in 12/2015. He had recurrent chest pain but cardiac cath in May 2018 with stable CAD.  Hx of a fib/flutter on Eliquis.  Hx of DCCV.  But is maintaining SR.    12/08/2018 - 1 year follow up, the patient continues to work as Administrator, now locally. He denies any chest pain or SOB at rest, no LE edema, orthopnea, PND. He is completely inactive, doesn't walk or exercise and continues to gain weight. No limits of restrictions on diet. Takes his meds with no side effects.   09/14/2019 - 9 months follow up, he continue to drive truck. HE doesn't exercise, moves around while at work.  He eats sandwiches and fast food frequently, he takes Lasix every other day and his lower extremity edema is stable.  He denies any chest pain and has stable dyspnea on moderate exertion.  He tolerates his medications well.   The patient does not have symptoms concerning for COVID-19 infection (fever, chills, cough, or new shortness of breath).   Past Medical History:  Diagnosis Date  . Abnormal liver enzymes    a. Sees a doctor in [place].  . Cardiac cirrhosis    a. possible elevated LFTs/low platelets felt due to cardiac cirrhosis per 2017 admission.  . Chronic combined systolic and diastolic CHF (congestive heart failure) ([place])   . CKD (chronic kidney disease), stage II   . Congenital heart defect    a. rightward rotation of heart, almost dextrocardia  . Coronary artery disease    a. s/p CABGx1 in 12/2015.  Marland Kitchen Hematuria    a. Chronic hx of this, no prior etiology determined through workup per patient.  . Hypercholesteremia    a. Prev taken off statin due to abnormal liver function.  . Hypertension   . Morbid obesity ([place])   . Paroxysmal atrial flutter ([place]) 02/12/2016  . 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 Sinus bradycardia   . Thrombocytopenia ([place])    Past Surgical History:  Procedure Laterality Date  . APPENDECTOMY    . CARDIAC CATHETERIZATION N/A 12/24/2015   Procedure: Right/Left Heart Cath and Coronary Angiography;  Surgeon: Jolaine Artist, MD;  Location: [place] CV [facility];  Service: Cardiovascular;  Laterality: N/A;  . CARDIOVERSION N/A 02/14/2016   Procedure: CARDIOVERSION;  Surgeon: Pixie Casino, MD;  Location: [facility]  ENDOSCOPY;  Service: Cardiovascular;  Laterality: N/A;  . CORONARY ARTERY BYPASS GRAFT N/A 12/26/2015   Procedure: Off Pump Coronary Artery Bypass Grafting times one using left internal mammary artery;  Surgeon: Rexene Alberts, MD;  Location: [place];  Service: Open Heart Surgery;  Laterality: N/A;  . GALLBLADDER SURGERY    . HERNIA REPAIR    . LEFT HEART CATH AND CORS/GRAFTS ANGIOGRAPHY N/A 04/15/2017   Procedure: Left Heart Cath and Cors/Grafts Angiography;  Surgeon: Troy Sine, MD;  Location: [place] CV LAB;  Service: Cardiovascular;  Laterality: N/A;   . TEE WITHOUT CARDIOVERSION N/A 12/26/2015   Procedure: TRANSESOPHAGEAL ECHOCARDIOGRAM (TEE);  Surgeon: Rexene Alberts, MD;  Location: [place];  Service: Open Heart Surgery;  Laterality: N/A;  . TEE WITHOUT CARDIOVERSION N/A 02/14/2016   Procedure: TRANSESOPHAGEAL ECHOCARDIOGRAM (TEE);  Surgeon: Pixie Casino, MD;  Location: [facility] ENDOSCOPY;  Service: Cardiovascular;  Laterality: N/A;     Current Meds  Medication Sig  . allopurinol (ZYLOPRIM) 100 MG tablet Take 1 tablet (100 mg total) by mouth daily.  . cetirizine (ZYRTEC) 10 MG tablet Take 10 mg by mouth daily.  . Cholecalciferol (VITAMIN D3) 1.25 MG (50000 UT) TABS Take 50,000 Units by mouth once a week.  . diltiazem (CARDIZEM CD) 120 MG 24 hr capsule TAKE 1 CAPSULE BY MOUTH DAILY.  Marland Kitchen ELIQUIS 5 MG TABS tablet TAKE 1 TABLET BY MOUTH TWICE DAILY.  Marland Kitchen Evolocumab (REPATHA SURECLICK) 737 MG/ML SOAJ Inject 1 pen into the skin every 14 (fourteen) days.  . furosemide (LASIX) 40 MG tablet Take 1 tablet (40 mg total) by mouth every other day.  . Methylcobalamin (B-12) 5000 MCG TBDP Take 5 mcg by mouth daily.  . metoprolol succinate (TOPROL-XL) 25 MG 24 hr tablet TAKE 1 TABLET BY MOUTH ONCE DAILY  . montelukast (SINGULAIR) 10 MG tablet Take 10 mg by mouth daily.  . pantoprazole (PROTONIX) 40 MG tablet TAKE 1 TABLET EVERY MORNING.  Marland Kitchen potassium chloride (K-DUR,KLOR-CON) 10 MEQ tablet TAKE 1 TABLET ONCE DAILY.    Allergies:   Imdur [isosorbide dinitrate], Augmentin [amoxicillin-pot clavulanate], and Tape   Social History   Tobacco Use  . Smoking status: Never Smoker  . Smokeless tobacco: Never Used  Substance Use Topics  . Alcohol use: Yes  . Drug use: No    Family Hx: The patient's family history includes CAD in his father; Heart attack in an other family member; Hyperlipidemia in an other family member.  ROS:   Please see the history of present illness.    All other systems reviewed and are negative.  Prior CV studies:   The following  studies were reviewed today:  TTE 2017 Procedure narrative: Transthoracic echocardiography. Image   quality was suboptimal. The study was technically difficult.   Intravenous contrast (Definity) was administered. - Left ventricle: The cavity size was normal. Wall thickness was   normal. Systolic function was mildly reduced. The estimated   ejection fraction was in the range of 45% to 50%. Features are   consistent with a pseudonormal left ventricular filling pattern,   with concomitant abnormal relaxation and increased filling   pressure (grade 2 diastolic dysfunction).  Impressions:  - The echo is extremely difficult. The views of the LV with   Definity contrast shows that the LV function is likely normal.   Specific wall motin information is not able to be determined by   this echo.   No specific valvular information can be stated on this echo  Labs/Other Tests and Data Reviewed:    EKG:  No ECG reviewed.  Recent Labs: 12/08/2018: BUN 24; Creatinine, Ser 1.68; Hemoglobin 15.3; Platelets 89; Potassium 4.4; Sodium 145; TSH 2.850 01/07/2019: ALT 77   Recent Lipid Panel Lab Results  Component Value Date/Time   CHOL 193 01/07/2019 03:08 PM   TRIG 144 01/07/2019 03:08 PM   HDL 46 01/07/2019 03:08 PM   CHOLHDL 4.2 01/07/2019 03:08 PM   CHOLHDL 2.7 11/26/2016 09:12 AM   LDLCALC 118 (H) 01/07/2019 03:08 PM   Wt Readings from Last 3 Encounters:  09/14/19 (!) 365 lb (165.6 kg)  12/08/18 (!) 358 lb 3.2 oz (162.5 kg)  02/08/18 (!) 356 lb (161.5 kg)     Objective:    Vital Signs:  BP (!) 149/81   Pulse (!) 54   Wt (!) 365 lb (165.6 kg)   BMI 50.91 kg/m    VITAL SIGNS:  reviewed   ASSESSMENT & PLAN:    1. CAD, s/p CABG x1, LIMA to LAD in January 2017. Repeat In May 2018 showed stable findings.  He could not tolerate any statins, currently on Repatha that he is tolerating well.  Repeat the blood work today.  We will follow.  2. Paroxsymal Atrial Flutter: s/p recent DCCV  02/26/16. Maintaining NSR. He is on anticoagulation with Eliquis and has no bleeding.  3. CAD s/p CABG x1V (12/2015) : As above.   4. Acute on Acute on chronic Systolic HF: LVEF 69-67%, minimal edema mostly sec to obesity, inactivity, diet non-compliance.  He is tolerating the Lasix every other day well with stable minimal lower extremity edema.  5. OSA - sleep significantly improved on CPAP machine.  6. Morbid obesity with ongoing weight gain, the patient has poor insight into the problem, he does not exercise and eats out a lot.  7. Elevated LFTs -improved LFTs after switch to Repatha, also he has mild LFT elevation most probably attributable to liver steatosis.  Follow up in 6 months.  Of note, the patient showed inappropriate behaviour toward our nurse. In the future we will request another nurse to work with him. If this is a repeated behaviour we will discharge him from the clinic.    COVID-19 Education: The signs and symptoms of COVID-19 were discussed with the patient and how to seek care for testing (follow up with PCP or arrange E-visit).  The importance of social distancing was discussed today.  Time:   Today, I have spent 25 minutes with the patient with telehealth technology discussing the above problems.    Medication Adjustments/Labs and Tests Ordered: Current medicines are reviewed at length with the patient today.  Concerns regarding medicines are outlined above.   Tests Ordered: No orders of the defined types were placed in this encounter.  Medication Changes: No orders of the defined types were placed in this encounter.  Follow Up:  In Person in 6 month(s)  Signed, Ena Dawley, MD  09/14/2019 10:36 AM    [place]

## 2019-09-14 NOTE — Addendum Note (Signed)
Addended by: Nuala Alpha on: 09/14/2019 11:03 AM   Modules accepted: Orders

## 2019-09-15 ENCOUNTER — Telehealth: Payer: Self-pay | Admitting: Pharmacist

## 2019-09-15 DIAGNOSIS — E7849 Other hyperlipidemia: Secondary | ICD-10-CM

## 2019-09-15 LAB — CBC WITH DIFFERENTIAL/PLATELET
Basophils Absolute: 0 10*3/uL (ref 0.0–0.2)
Basos: 1 %
EOS (ABSOLUTE): 0.2 10*3/uL (ref 0.0–0.4)
Eos: 5 %
Hematocrit: 44.8 % (ref 37.5–51.0)
Hemoglobin: 15.4 g/dL (ref 13.0–17.7)
Immature Grans (Abs): 0 10*3/uL (ref 0.0–0.1)
Immature Granulocytes: 0 %
Lymphocytes Absolute: 1.4 10*3/uL (ref 0.7–3.1)
Lymphs: 29 %
MCH: 31.7 pg (ref 26.6–33.0)
MCHC: 34.4 g/dL (ref 31.5–35.7)
MCV: 92 fL (ref 79–97)
Monocytes Absolute: 0.6 10*3/uL (ref 0.1–0.9)
Monocytes: 11 %
Neutrophils Absolute: 2.6 10*3/uL (ref 1.4–7.0)
Neutrophils: 54 %
Platelets: 79 10*3/uL — CL (ref 150–450)
RBC: 4.86 x10E6/uL (ref 4.14–5.80)
RDW: 13.4 % (ref 11.6–15.4)
WBC: 4.9 10*3/uL (ref 3.4–10.8)

## 2019-09-15 LAB — PRO B NATRIURETIC PEPTIDE: NT-Pro BNP: 56 pg/mL (ref 0–121)

## 2019-09-15 LAB — COMPREHENSIVE METABOLIC PANEL
ALT: 94 IU/L — ABNORMAL HIGH (ref 0–44)
AST: 58 IU/L — ABNORMAL HIGH (ref 0–40)
Albumin/Globulin Ratio: 1.4 (ref 1.2–2.2)
Albumin: 3.8 g/dL — ABNORMAL LOW (ref 4.0–5.0)
Alkaline Phosphatase: 243 IU/L — ABNORMAL HIGH (ref 39–117)
BUN/Creatinine Ratio: 15 (ref 9–20)
BUN: 21 mg/dL (ref 6–24)
Bilirubin Total: 0.9 mg/dL (ref 0.0–1.2)
CO2: 21 mmol/L (ref 20–29)
Calcium: 9.6 mg/dL (ref 8.7–10.2)
Chloride: 102 mmol/L (ref 96–106)
Creatinine, Ser: 1.38 mg/dL — ABNORMAL HIGH (ref 0.76–1.27)
GFR calc Af Amer: 70 mL/min/{1.73_m2} (ref >59)
GFR calc non Af Amer: 61 mL/min/{1.73_m2} (ref >59)
Globulin, Total: 2.8 g/dL (ref 1.5–4.5)
Glucose: 87 mg/dL (ref 65–99)
Potassium: 4 mmol/L (ref 3.5–5.2)
Sodium: 141 mmol/L (ref 134–144)
Total Protein: 6.6 g/dL (ref 6.0–8.5)

## 2019-09-15 LAB — TSH: TSH: 3.81 u[IU]/mL (ref 0.450–4.500)

## 2019-09-15 NOTE — Telephone Encounter (Signed)
Lipid panel not collected yesterday due to ordering error. Lipid panel and direct LDL will be attempted to be added on

## 2019-09-17 LAB — LIPID PANEL
Chol/HDL Ratio: 3.9 ratio (ref 0.0–5.0)
Cholesterol, Total: 189 mg/dL (ref 100–199)
HDL: 49 mg/dL (ref >39)
LDL Chol Calc (NIH): 107 mg/dL — ABNORMAL HIGH (ref 0–99)
Triglycerides: 189 mg/dL — ABNORMAL HIGH (ref 0–149)
VLDL Cholesterol Cal: 33 mg/dL (ref 5–40)

## 2019-09-17 LAB — SPECIMEN STATUS REPORT

## 2019-09-17 LAB — LDL CHOLESTEROL, DIRECT: LDL Direct: 98 mg/dL (ref 0–99)

## 2019-09-19 ENCOUNTER — Telehealth: Payer: Self-pay | Admitting: Cardiology

## 2019-09-19 MED ORDER — EZETIMIBE 10 MG PO TABS: 10.0000 mg | ORAL_TABLET | Freq: Every day | ORAL | 3 refills | Status: DC

## 2019-09-19 NOTE — Telephone Encounter (Signed)
Patient returned call for lab results.  

## 2019-09-19 NOTE — Telephone Encounter (Signed)
Direct LDL is 98. LDL has only come down from 118. Would have expected a better LDL reduction. Patient confirmed that he is taking Repatha every 2 weeks, injecting into the fat of his thighs, viewfinder is turing yellow and has not missed any doses.  Will add Zetia 10mg  daily. Will check lipids and lft in 1 month due to hx of elevated LFT.

## 2019-09-20 NOTE — Telephone Encounter (Signed)
Pt has been notified of lab results per Rico Junker. RMA on 09/19/19.

## 2019-09-26 MED ORDER — EZETIMIBE 10 MG PO TABS: 10.0000 mg | ORAL_TABLET | Freq: Every day | ORAL | 3 refills | Status: DC

## 2019-09-26 NOTE — Telephone Encounter (Signed)
Rx sent [facility] previous. Per pt mother, they use [facility] in [place]. I have sent Rx there and updated patient pharmacy profile. Advised that they may need [facility] to reverse the claim.

## 2019-09-26 NOTE — Telephone Encounter (Signed)
Will route this request to our Pharmacist who follow this pts Repatha and lipids, for further review, recommendation, and follow-up.

## 2019-09-26 NOTE — Telephone Encounter (Signed)
  Patient's mother is following up regarding adding another medication with his Repatha shot. Previous note states Zetia was going to be added but they have not heard anymore regarding this.

## 2019-09-26 NOTE — Addendum Note (Signed)
Addended by: Marcelle Overlie D on: 09/26/2019 10:35 AM   Modules accepted: Orders

## 2019-10-20 ENCOUNTER — Other Ambulatory Visit: Payer: BC Managed Care – PPO

## 2019-10-21 ENCOUNTER — Other Ambulatory Visit: Payer: Self-pay

## 2019-10-21 ENCOUNTER — Other Ambulatory Visit: Payer: BC Managed Care – PPO | Admitting: *Deleted

## 2019-10-21 DIAGNOSIS — E7849 Other hyperlipidemia: Secondary | ICD-10-CM

## 2019-10-21 LAB — HEPATIC FUNCTION PANEL
ALT: 56 IU/L — ABNORMAL HIGH (ref 0–44)
AST: 39 IU/L (ref 0–40)
Albumin: 3.9 g/dL — ABNORMAL LOW (ref 4.0–5.0)
Alkaline Phosphatase: 231 IU/L — ABNORMAL HIGH (ref 39–117)
Bilirubin Total: 0.6 mg/dL (ref 0.0–1.2)
Bilirubin, Direct: 0.26 mg/dL (ref 0.00–0.40)
Total Protein: 6.9 g/dL (ref 6.0–8.5)

## 2019-10-21 LAB — LIPID PANEL
Chol/HDL Ratio: 2.3 ratio (ref 0.0–5.0)
Cholesterol, Total: 117 mg/dL (ref 100–199)
HDL: 52 mg/dL (ref >39)
LDL Chol Calc (NIH): 45 mg/dL (ref 0–99)
Triglycerides: 110 mg/dL (ref 0–149)
VLDL Cholesterol Cal: 20 mg/dL (ref 5–40)

## 2019-10-21 LAB — LDL CHOLESTEROL, DIRECT: LDL Direct: 45 mg/dL (ref 0–99)

## 2019-12-09 DIAGNOSIS — D6959 Other secondary thrombocytopenia: Secondary | ICD-10-CM | POA: Diagnosis not present

## 2019-12-09 DIAGNOSIS — D693 Immune thrombocytopenic purpura: Secondary | ICD-10-CM | POA: Diagnosis not present

## 2019-12-14 ENCOUNTER — Other Ambulatory Visit: Payer: Self-pay | Admitting: Cardiology

## 2020-01-11 DIAGNOSIS — G4733 Obstructive sleep apnea (adult) (pediatric): Secondary | ICD-10-CM | POA: Diagnosis not present

## 2020-02-07 DIAGNOSIS — I4892 Unspecified atrial flutter: Secondary | ICD-10-CM | POA: Diagnosis not present

## 2020-02-07 DIAGNOSIS — J40 Bronchitis, not specified as acute or chronic: Secondary | ICD-10-CM | POA: Diagnosis not present

## 2020-02-07 DIAGNOSIS — N1831 Chronic kidney disease, stage 3a: Secondary | ICD-10-CM | POA: Diagnosis not present

## 2020-03-12 DIAGNOSIS — E538 Deficiency of other specified B group vitamins: Secondary | ICD-10-CM | POA: Diagnosis not present

## 2020-03-12 DIAGNOSIS — I129 Hypertensive chronic kidney disease with stage 1 through stage 4 chronic kidney disease, or unspecified chronic kidney disease: Secondary | ICD-10-CM | POA: Diagnosis not present

## 2020-03-12 DIAGNOSIS — E782 Mixed hyperlipidemia: Secondary | ICD-10-CM | POA: Diagnosis not present

## 2020-03-12 DIAGNOSIS — R5381 Other malaise: Secondary | ICD-10-CM | POA: Diagnosis not present

## 2020-03-12 DIAGNOSIS — R5383 Other fatigue: Secondary | ICD-10-CM | POA: Diagnosis not present

## 2020-03-12 DIAGNOSIS — R7303 Prediabetes: Secondary | ICD-10-CM | POA: Diagnosis not present

## 2020-03-12 DIAGNOSIS — J0101 Acute recurrent maxillary sinusitis: Secondary | ICD-10-CM | POA: Diagnosis not present

## 2020-03-12 DIAGNOSIS — I10 Essential (primary) hypertension: Secondary | ICD-10-CM | POA: Diagnosis not present

## 2020-03-12 DIAGNOSIS — N1831 Chronic kidney disease, stage 3a: Secondary | ICD-10-CM | POA: Diagnosis not present

## 2020-03-12 DIAGNOSIS — E559 Vitamin D deficiency, unspecified: Secondary | ICD-10-CM | POA: Diagnosis not present

## 2020-03-13 DIAGNOSIS — G4733 Obstructive sleep apnea (adult) (pediatric): Secondary | ICD-10-CM | POA: Diagnosis not present

## 2020-03-13 DIAGNOSIS — J301 Allergic rhinitis due to pollen: Secondary | ICD-10-CM | POA: Diagnosis not present

## 2020-03-13 DIAGNOSIS — E662 Morbid (severe) obesity with alveolar hypoventilation: Secondary | ICD-10-CM | POA: Diagnosis not present

## 2020-03-23 DIAGNOSIS — G4733 Obstructive sleep apnea (adult) (pediatric): Secondary | ICD-10-CM | POA: Diagnosis not present

## 2020-04-02 DIAGNOSIS — E662 Morbid (severe) obesity with alveolar hypoventilation: Secondary | ICD-10-CM | POA: Diagnosis not present

## 2020-04-02 DIAGNOSIS — G4733 Obstructive sleep apnea (adult) (pediatric): Secondary | ICD-10-CM | POA: Diagnosis not present

## 2020-04-02 DIAGNOSIS — J301 Allergic rhinitis due to pollen: Secondary | ICD-10-CM | POA: Diagnosis not present

## 2020-04-12 DIAGNOSIS — G4733 Obstructive sleep apnea (adult) (pediatric): Secondary | ICD-10-CM | POA: Diagnosis not present

## 2020-04-24 ENCOUNTER — Other Ambulatory Visit: Payer: Self-pay | Admitting: Cardiology

## 2020-04-24 DIAGNOSIS — I2510 Atherosclerotic heart disease of native coronary artery without angina pectoris: Secondary | ICD-10-CM

## 2020-04-24 DIAGNOSIS — I10 Essential (primary) hypertension: Secondary | ICD-10-CM

## 2020-04-24 DIAGNOSIS — E7849 Other hyperlipidemia: Secondary | ICD-10-CM

## 2020-05-07 DIAGNOSIS — K2101 Gastro-esophageal reflux disease with esophagitis, with bleeding: Secondary | ICD-10-CM | POA: Diagnosis not present

## 2020-05-07 DIAGNOSIS — R7303 Prediabetes: Secondary | ICD-10-CM | POA: Diagnosis not present

## 2020-05-25 DIAGNOSIS — Z1211 Encounter for screening for malignant neoplasm of colon: Secondary | ICD-10-CM | POA: Diagnosis not present

## 2020-05-25 DIAGNOSIS — K219 Gastro-esophageal reflux disease without esophagitis: Secondary | ICD-10-CM | POA: Diagnosis not present

## 2020-05-25 DIAGNOSIS — R7989 Other specified abnormal findings of blood chemistry: Secondary | ICD-10-CM | POA: Diagnosis not present

## 2020-05-25 DIAGNOSIS — R1013 Epigastric pain: Secondary | ICD-10-CM | POA: Diagnosis not present

## 2020-06-08 DIAGNOSIS — R7989 Other specified abnormal findings of blood chemistry: Secondary | ICD-10-CM | POA: Diagnosis not present

## 2020-06-08 DIAGNOSIS — Q600 Renal agenesis, unilateral: Secondary | ICD-10-CM | POA: Diagnosis not present

## 2020-06-08 DIAGNOSIS — I77811 Abdominal aortic ectasia: Secondary | ICD-10-CM | POA: Diagnosis not present

## 2020-06-08 DIAGNOSIS — K7581 Nonalcoholic steatohepatitis (NASH): Secondary | ICD-10-CM | POA: Diagnosis not present

## 2020-06-08 DIAGNOSIS — K7689 Other specified diseases of liver: Secondary | ICD-10-CM | POA: Diagnosis not present

## 2020-06-15 DIAGNOSIS — I77819 Aortic ectasia, unspecified site: Secondary | ICD-10-CM | POA: Insufficient documentation

## 2020-07-09 DIAGNOSIS — E662 Morbid (severe) obesity with alveolar hypoventilation: Secondary | ICD-10-CM | POA: Diagnosis not present

## 2020-07-09 DIAGNOSIS — G4733 Obstructive sleep apnea (adult) (pediatric): Secondary | ICD-10-CM | POA: Diagnosis not present

## 2020-07-09 DIAGNOSIS — J301 Allergic rhinitis due to pollen: Secondary | ICD-10-CM | POA: Diagnosis not present

## 2020-07-11 DIAGNOSIS — G4733 Obstructive sleep apnea (adult) (pediatric): Secondary | ICD-10-CM | POA: Diagnosis not present

## 2020-07-20 DIAGNOSIS — I2510 Atherosclerotic heart disease of native coronary artery without angina pectoris: Secondary | ICD-10-CM | POA: Diagnosis not present

## 2020-07-20 DIAGNOSIS — I4892 Unspecified atrial flutter: Secondary | ICD-10-CM | POA: Diagnosis not present

## 2020-07-20 DIAGNOSIS — I10 Essential (primary) hypertension: Secondary | ICD-10-CM | POA: Diagnosis not present

## 2020-07-20 DIAGNOSIS — K7581 Nonalcoholic steatohepatitis (NASH): Secondary | ICD-10-CM | POA: Diagnosis not present

## 2020-08-01 ENCOUNTER — Ambulatory Visit: Payer: BC Managed Care – PPO | Admitting: Cardiology

## 2020-08-13 ENCOUNTER — Ambulatory Visit: Payer: BC Managed Care – PPO | Admitting: Cardiology

## 2020-08-13 ENCOUNTER — Encounter: Payer: Self-pay | Admitting: Cardiology

## 2020-08-13 ENCOUNTER — Other Ambulatory Visit: Payer: Self-pay

## 2020-08-13 VITALS — BP 126/70 | HR 45 | Ht 71.0 in | Wt 369.0 lb

## 2020-08-13 DIAGNOSIS — R001 Bradycardia, unspecified: Secondary | ICD-10-CM

## 2020-08-13 DIAGNOSIS — I5042 Chronic combined systolic (congestive) and diastolic (congestive) heart failure: Secondary | ICD-10-CM | POA: Diagnosis not present

## 2020-08-13 DIAGNOSIS — Z951 Presence of aortocoronary bypass graft: Secondary | ICD-10-CM

## 2020-08-13 DIAGNOSIS — I2510 Atherosclerotic heart disease of native coronary artery without angina pectoris: Secondary | ICD-10-CM | POA: Diagnosis not present

## 2020-08-13 NOTE — Patient Instructions (Signed)
Medication Instructions:   STOP TAKING METOPROLOL SUCCINATE NOW  *If you need a refill on your cardiac medications before your next appointment, please call your pharmacy*   Lab Work:  TODAY--CMET, CBC, TSH, AND LIPIDS  If you have labs (blood work) drawn today and your tests are completely normal, you will receive your results only by: Marland Kitchen MyChart Message (if you have MyChart) OR . A paper copy in the mail If you have any lab test that is abnormal or we need to change your treatment, we will call you to review the results.   Follow-Up: At [facility], you and your health needs are our priority.  As part of our continuing mission to provide you with exceptional heart care, we have created designated Provider Care Teams.  These Care Teams include your primary Cardiologist (physician) and Advanced Practice Providers (APPs -  Physician Assistants and Nurse Practitioners) who all work together to provide you with the care you need, when you need it.  We recommend signing up for the patient portal called "MyChart".  Sign up information is provided on this After Visit Summary.  MyChart is used to connect with patients for Virtual Visits (Telemedicine).  Patients are able to view lab/test results, encounter notes, upcoming appointments, etc.  Non-urgent messages can be sent to your provider as well.   To learn more about what you can do with MyChart, go to [email or web address].    Your next appointment:   12 month(s)  The format for your next appointment:   In Person  Provider:   Ena Dawley, MD

## 2020-08-13 NOTE — Progress Notes (Signed)
Cardiology Office Note:    Date:  08/13/2020   ID:  Sean [name], DOB December 08, 1970, MRN [id number]  PCP:  Raina Mina., MD  [facility] Cardiologist:  Ena Dawley, MD  [facility] Electrophysiologist:  None   Referring MD: Raina Mina., MD   Chief complaint: Fatigue  History of Present Illness:    Sean [name] is a 49 y.o. male with a hx of hx of morbid obesity, hypertension, hyperlipidemia, CAD, s/p CABG CABG, LIMA to LAD in 12/2015. He had recurrent chest pain but cardiac cath in May 2018 with stable CAD.  Hx of a fib/flutter on Eliquis.  Hx of DCCV.  But is maintaining SR.    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  He feels tired but denies any chest pain or shortness of breath that is out of his baseline.  He has been very compliant with his medications including Repatha and Eliquis, denies any bleeding, and no palpitations.  He has not been using the Lasix as he did not need it, versus compression socks.  No orthopnea or proximal nocturnal dyspnea.  No dizziness or falls.  Past Medical History:  Diagnosis Date  . Abnormal liver enzymes    a. Sees a doctor in [place].  . Cardiac cirrhosis    a. possible elevated LFTs/low platelets felt due to cardiac cirrhosis per 2017 admission.  . Chronic combined systolic and diastolic CHF (congestive heart failure) ([place])   . CKD (chronic kidney disease), stage II   . Congenital heart defect    a. rightward rotation of heart, almost dextrocardia  . Coronary artery disease    a. s/p CABGx1 in 12/2015.  Marland Kitchen Hematuria    a. Chronic hx of this, no prior etiology determined through workup per patient.  . Hypercholesteremia    a. Prev taken off statin due to abnormal liver function.  . Hypertension   . Morbid obesity ([place])   . Paroxysmal atrial flutter ([place]) 02/12/2016  . 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 Sinus  bradycardia   . Thrombocytopenia ([place])     Past Surgical History:  Procedure Laterality Date  . APPENDECTOMY    . CARDIAC CATHETERIZATION N/A 12/24/2015   Procedure: Right/Left Heart Cath and Coronary Angiography;  Surgeon: Jolaine Artist, MD;  Location: [place] [facility];  Service: Cardiovascular;  Laterality: N/A;  . CARDIOVERSION N/A 02/14/2016   Procedure: CARDIOVERSION;  Surgeon: Pixie Casino, MD;  Location: [facility] ENDOSCOPY;  Service: Cardiovascular;  Laterality: N/A;  . CORONARY ARTERY BYPASS GRAFT N/A 12/26/2015   Procedure: Off Pump Coronary Artery Bypass Grafting times one using left internal mammary artery;  Surgeon: Rexene Alberts, MD;  Location: [place];  Service: Open Heart Surgery;  Laterality: N/A;  . GALLBLADDER SURGERY    . HERNIA REPAIR    . LEFT HEART CATH AND CORS/GRAFTS ANGIOGRAPHY N/A 04/15/2017   Procedure: Left Heart Cath and Cors/Grafts Angiography;  Surgeon: Troy Sine, MD;  Location: [place] [facility] LAB;  Service: Cardiovascular;  Laterality: N/A;  . TEE WITHOUT CARDIOVERSION N/A 12/26/2015   Procedure: TRANSESOPHAGEAL ECHOCARDIOGRAM (TEE);  Surgeon: Rexene Alberts, MD;  Location: [place];  Service: Open Heart Surgery;  Laterality: N/A;  . TEE WITHOUT CARDIOVERSION N/A 02/14/2016   Procedure: TRANSESOPHAGEAL ECHOCARDIOGRAM (TEE);  Surgeon: Pixie Casino, MD;  Location: [place];  Service: Cardiovascular;  Laterality: N/A;    Current Medications: Current Meds  Medication Sig  . allopurinol (ZYLOPRIM) 100 MG tablet Take 1 tablet (100 mg total) by mouth daily.  . cetirizine (ZYRTEC) 10 MG tablet Take 10 mg by mouth daily.  Marland Kitchen diltiazem (CARDIZEM CD) 120 MG 24 hr capsule TAKE ONE CAPSULE BY MOUTH DAILY  . ELIQUIS 5 MG TABS tablet TAKE 1 TABLET BY MOUTH TWICE DAILY.  . famotidine (PEPCID) 40 MG tablet Take 40 mg by mouth daily.  . furosemide (LASIX) 40 MG tablet Take 40 mg by mouth as needed.  . Methylcobalamin (B-12) 5000 MCG TBDP Take 5 mcg by mouth  daily.  . montelukast (SINGULAIR) 10 MG tablet Take 10 mg by mouth daily.  . nitroGLYCERIN (NITROSTAT) 0.4 MG SL tablet Place 1 tablet (0.4 mg total) under the tongue every 5 (five) minutes as needed for chest pain.  . pantoprazole (PROTONIX) 40 MG tablet TAKE 1 TABLET EVERY MORNING.  Marland Kitchen potassium chloride (K-DUR,KLOR-CON) 10 MEQ tablet TAKE 1 TABLET ONCE DAILY.  Marland Kitchen REPATHA SURECLICK 161 MG/ML SOAJ INJECT THE CONTENTS OF 1 SYRINGE UNDER THE SKIN EVERY 14 DAYS  . sucralfate (CARAFATE) 1 g tablet Take 1 g by mouth daily. For 14 days  . [DISCONTINUED] metoprolol succinate (TOPROL-XL) 25 MG 24 hr tablet TAKE 1 TABLET BY MOUTH ONCE DAILY     Allergies:   Imdur [isosorbide dinitrate], Augmentin [amoxicillin-pot clavulanate], and Tape   Social History   Socioeconomic History  . Marital status: Legally Separated    Spouse name: Not on file  . Number of children: Not on file  . Years of education: Not on file  . Highest education level: Not on file  Occupational History  . Occupation: Truck Geophysicist/field seismologist  Tobacco Use  . Smoking status: Never Smoker  . Smokeless tobacco: Never Used  Vaping Use  . Vaping Use: Never used  Substance and Sexual Activity  . Alcohol use: Yes  . Drug use: No  . Sexual activity: Not on file  Other Topics Concern  . Not on file  Social History Narrative  . Not on file   Social Determinants of Health   Financial Resource Strain:   . Difficulty of Paying Living Expenses: Not on file  Food Insecurity:   . Worried About Charity fundraiser in the Last Year: Not on file  . Ran Out of Food in the Last Year: Not on file  Transportation Needs:   . Lack of Transportation (Medical): Not on file  . Lack of Transportation (Non-Medical): Not on file  Physical Activity:   . Days of Exercise per Week: Not on file  . Minutes of Exercise per Session: Not on file  Stress:   . Feeling of Stress : Not on file  Social Connections:   . Frequency of Communication with Friends and  Family: Not on file  . Frequency of Social Gatherings with Friends and Family: Not on file  . Attends Religious Services: Not on file  . Active Member of Clubs or Organizations: Not on file  . Attends Archivist Meetings: Not on file  . Marital Status: Not on file     Family History: The patient's family history includes CAD in his father; Heart attack in an other family member; Hyperlipidemia in an other family member.  ROS:   Please see the history of present illness.    All other systems reviewed and are negative.  EKGs/Labs/Other Studies Reviewed:    The following studies were  reviewed today:  EKG:  EKG is ordered today.  The ekg ordered today demonstrates sinus bradycardia, negative T waves in the anterior leads, heart rate slower than before, this was personally reviewed.  Recent Labs: 09/14/2019: BUN 21; Creatinine, Ser 1.38; Hemoglobin 15.4; NT-Pro BNP 56; Platelets 79; Potassium 4.0; Sodium 141; TSH 3.810 10/21/2019: ALT 56  Recent Lipid Panel    Component Value Date/Time   CHOL 117 10/21/2019 0942   TRIG 110 10/21/2019 0942   HDL 52 10/21/2019 0942   CHOLHDL 2.3 10/21/2019 0942   CHOLHDL 2.7 11/26/2016 0912   VLDL 18 11/26/2016 0912   LDLCALC 45 10/21/2019 0942   LDLDIRECT 45 10/21/2019 0942    Physical Exam:    VS:  BP 126/70   Pulse (!) 45   Ht _0  (1.803 m)   Wt (!) 369 lb (167.4 kg)   SpO2 97%   BMI 51.47 kg/m     Wt Readings from Last 3 Encounters:  08/13/20 (!) 369 lb (167.4 kg)  09/14/19 (!) 365 lb (165.6 kg)  12/08/18 (!) 358 lb 3.2 oz (162.5 kg)     GEN:  Obese HEENT: Normal NECK: No JVD; No carotid bruits LYMPHATICS: No lymphadenopathy CARDIAC: RRR, no murmurs, rubs, gallops RESPIRATORY:  Clear to auscultation without rales, wheezing or rhonchi  ABDOMEN: Soft, non-tender, non-distended MUSCULOSKELETAL:  No edema; No deformity  SKIN: Warm and dry NEUROLOGIC:  Alert and oriented x 3 PSYCHIATRIC:  Normal affect    ASSESSMENT:    1. Coronary artery disease involving native coronary artery of native heart without angina pectoris   2. Chronic combined systolic and diastolic CHF (congestive heart failure) ([place])   3. Hx of CABG   4. Sinus bradycardia    PLAN:    In order of problems listed above:  1. CAD, s/p CABG x1, LIMA to LAD in January 2017. Repeat In May 2018 showed stable findings.  He could not tolerate any statins, currently on Repatha that he is tolerating well.  His heart rate to slow in 40s, I will discontinue metoprolol.  2. Paroxsymal Atrial Flutter: s/p DCCV 02/26/16. Maintaining NSR. He is on anticoagulation with Eliquis and has no bleeding.  Hemoglobin last year 15.4, will repeat today.  3. Chronic Systolic HF: LVEF 42-70%, he is well compensated, Lasix only as needed.  4. OSA - sleep significantly improved on CPAP machine.  5. Morbid obesity with ongoing weight gain, the patient has poor insight into the problem.  6. Elevated LFTs -improved LFTs after switch to Repatha, also he has mild LFT elevation most probably attributable to liver steatosis.  7.  Hyperlipidemia, on Repatha and Zetia, will repeat labs today.  Medication Adjustments/Labs and Tests Ordered: Current medicines are reviewed at length with the patient today.  Concerns regarding medicines are outlined above.  Orders Placed This Encounter  Procedures  . Comp Met (CMET)  . CBC  . TSH  . Lipid Profile  . EKG 12-Lead   No orders of the defined types were placed in this encounter.   Patient Instructions  Medication Instructions:   STOP TAKING METOPROLOL SUCCINATE NOW  *If you need a refill on your cardiac medications before your next appointment, please call your pharmacy*   Lab Work:  TODAY--CMET, CBC, TSH, AND LIPIDS  If you have labs (blood work) drawn today and your tests are completely normal, you will receive your results only by: Marland Kitchen MyChart Message (if you have MyChart) OR . A paper copy in the  mail  If you have any lab test that is abnormal or we need to change your treatment, we will call you to review the results.   Follow-Up: At [facility], you and your health needs are our priority.  As part of our continuing mission to provide you with exceptional heart care, we have created designated Provider Care Teams.  These Care Teams include your primary Cardiologist (physician) and Advanced Practice Providers (APPs -  Physician Assistants and Nurse Practitioners) who all work together to provide you with the care you need, when you need it.  We recommend signing up for the patient portal called "MyChart".  Sign up information is provided on this After Visit Summary.  MyChart is used to connect with patients for Virtual Visits (Telemedicine).  Patients are able to view lab/test results, encounter notes, upcoming appointments, etc.  Non-urgent messages can be sent to your provider as well.   To learn more about what you can do with MyChart, go to [email or web address].    Your next appointment:   12 month(s)  The format for your next appointment:   In Person  Provider:   Ena Dawley, MD        Signed, Ena Dawley, MD  08/13/2020 9:09 AM    [place]

## 2020-08-14 LAB — LIPID PANEL
Chol/HDL Ratio: 2.2 ratio (ref 0.0–5.0)
Cholesterol, Total: 107 mg/dL (ref 100–199)
HDL: 49 mg/dL (ref >39)
LDL Chol Calc (NIH): 37 mg/dL (ref 0–99)
Triglycerides: 118 mg/dL (ref 0–149)
VLDL Cholesterol Cal: 21 mg/dL (ref 5–40)

## 2020-08-14 LAB — CBC
Hematocrit: 42.2 % (ref 37.5–51.0)
Hemoglobin: 14.6 g/dL (ref 13.0–17.7)
MCH: 33.1 pg — ABNORMAL HIGH (ref 26.6–33.0)
MCHC: 34.6 g/dL (ref 31.5–35.7)
MCV: 96 fL (ref 79–97)
Platelets: 50 10*3/uL — CL (ref 150–450)
RBC: 4.41 x10E6/uL (ref 4.14–5.80)
RDW: 12.6 % (ref 11.6–15.4)
WBC: 3.8 10*3/uL (ref 3.4–10.8)

## 2020-08-14 LAB — COMPREHENSIVE METABOLIC PANEL
ALT: 59 IU/L — ABNORMAL HIGH (ref 0–44)
AST: 36 IU/L (ref 0–40)
Albumin/Globulin Ratio: 1.5 (ref 1.2–2.2)
Albumin: 3.7 g/dL — ABNORMAL LOW (ref 4.0–5.0)
Alkaline Phosphatase: 198 IU/L — ABNORMAL HIGH (ref 44–121)
BUN/Creatinine Ratio: 11 (ref 9–20)
BUN: 16 mg/dL (ref 6–24)
Bilirubin Total: 0.6 mg/dL (ref 0.0–1.2)
CO2: 24 mmol/L (ref 20–29)
Calcium: 9 mg/dL (ref 8.7–10.2)
Chloride: 105 mmol/L (ref 96–106)
Creatinine, Ser: 1.42 mg/dL — ABNORMAL HIGH (ref 0.76–1.27)
GFR calc Af Amer: 68 mL/min/{1.73_m2} (ref >59)
GFR calc non Af Amer: 58 mL/min/{1.73_m2} — ABNORMAL LOW (ref >59)
Globulin, Total: 2.5 g/dL (ref 1.5–4.5)
Glucose: 110 mg/dL — ABNORMAL HIGH (ref 65–99)
Potassium: 4.6 mmol/L (ref 3.5–5.2)
Sodium: 139 mmol/L (ref 134–144)
Total Protein: 6.2 g/dL (ref 6.0–8.5)

## 2020-08-14 LAB — TSH: TSH: 3.22 u[IU]/mL (ref 0.450–4.500)

## 2020-08-21 ENCOUNTER — Telehealth: Payer: Self-pay | Admitting: Cardiology

## 2020-08-21 NOTE — Telephone Encounter (Signed)
Faxed labs again to [facility] at [facility], the pts labs, including cbc, as requested again.  Faxed these results last week and they confirmed they received the pts lab results we sent, and was going to assist him with getting an earlier appt with his Hematologist there, Dr. Bobby Rumpf.  Refaxed labs again, via epic fax function, to contact number mentioned in this message.  Lorriane Shire in medical records will fax a hard copy of these results as well.

## 2020-08-21 NOTE — Telephone Encounter (Signed)
Will send to both HIM dept Lorriane Shire M.) and Dr. Francesca Oman nurse as Juluis Rainier.

## 2020-08-21 NOTE — Telephone Encounter (Signed)
    Sean [name] is calling would like to get copy of pt's labs especially for pt's iron. She gave fax# [phone]

## 2020-08-22 NOTE — Telephone Encounter (Addendum)
Labs printed off and placed in nurse fax box for Medical Records to fax to Dr. Jaclyn Shaggy office.

## 2020-09-03 DIAGNOSIS — D693 Immune thrombocytopenic purpura: Secondary | ICD-10-CM | POA: Diagnosis not present

## 2020-09-03 DIAGNOSIS — D6959 Other secondary thrombocytopenia: Secondary | ICD-10-CM | POA: Diagnosis not present

## 2020-09-21 DIAGNOSIS — R5381 Other malaise: Secondary | ICD-10-CM | POA: Diagnosis not present

## 2020-09-21 DIAGNOSIS — E782 Mixed hyperlipidemia: Secondary | ICD-10-CM | POA: Diagnosis not present

## 2020-09-21 DIAGNOSIS — Z6841 Body Mass Index (BMI) 40.0 and over, adult: Secondary | ICD-10-CM | POA: Diagnosis not present

## 2020-09-21 DIAGNOSIS — I129 Hypertensive chronic kidney disease with stage 1 through stage 4 chronic kidney disease, or unspecified chronic kidney disease: Secondary | ICD-10-CM | POA: Diagnosis not present

## 2020-09-21 DIAGNOSIS — E538 Deficiency of other specified B group vitamins: Secondary | ICD-10-CM | POA: Diagnosis not present

## 2020-09-21 DIAGNOSIS — R5383 Other fatigue: Secondary | ICD-10-CM | POA: Diagnosis not present

## 2020-09-21 DIAGNOSIS — I10 Essential (primary) hypertension: Secondary | ICD-10-CM | POA: Diagnosis not present

## 2020-09-21 DIAGNOSIS — N1831 Chronic kidney disease, stage 3a: Secondary | ICD-10-CM | POA: Diagnosis not present

## 2020-09-25 ENCOUNTER — Telehealth: Payer: Self-pay | Admitting: Cardiology

## 2020-09-25 NOTE — Telephone Encounter (Signed)
Will route this message to our Pharmacist in lipid clinic, for they manage and follow this pts Repatha.

## 2020-09-25 NOTE — Telephone Encounter (Signed)
    Pt c/o medication issue:  1. Name of Medication:   REPATHA SURECLICK 373 MG/ML SOAJ    2. How are you currently taking this medication (dosage and times per day)? INJECT THE CONTENTS OF 1 SYRINGE UNDER THE SKIN EVERY 14 DAYS  3. Are you having a reaction (difficulty breathing--STAT)?   4. What is your medication issue? Pt said the pharmacy notified him that his insurance wont cover this medication anymore. He wanted to know what he needs to do if theres any alternative medication

## 2020-09-25 NOTE — Telephone Encounter (Signed)
Rx was filled at [facility] on 09/21/20. I called and confirmed with pharmacy that it was filled (not picked up yet). [facility] it wasn't [facility]. But really it was just a refill too soon.  I called pt and told him [facility] was [facility] at [facility]. Said his PCP must have called it into [facility] when he has his check up. Michela Pitcher it was ok, he would get it at [facility].

## 2020-10-02 ENCOUNTER — Telehealth: Payer: Self-pay | Admitting: Cardiology

## 2020-10-02 NOTE — Telephone Encounter (Signed)
Pt is calling to let Dr. Meda Coffee know that he is driving home from [place] to go to the ER at [facility].  Pt states he is having the same complaints he had with his last heart attack. Pt is refusing to seek the ER in [place], stating he is with his boss. Pt states he will be ok to make it to [place]. Pt states he is having complaints of chest pressure, pain between his shoulder blades, lower abdominal pain, rib cage pain, constipation, both arms aching, extreme indigestion, and getting hot flashes then feeling extremely cold at times.  He c/o no sob or DOE. He complains of no dizziness, lightheadedness, pre-syncopal or syncopal episodes.  Pt states he is concerned for he felt like this last time he had his heart attack.  Pt refusing urgent medical assistance where he is located, for he states "I don't want to be stuck down here with a truck and trailer."  Highly urged him to seek this assistance, even though he refused recommendation.  Advised the pt to have someone drive him.  ED/911 precautions advised if his symptoms worsen between now and getting home to [place].  Informed the pt that Dr. Meda Coffee is out of the office at this time, but I will route this information to her as an FYI.  Pt verbalized understanding and agrees with this plan. Pt states he is on his way to [place] now.

## 2020-10-02 NOTE — Telephone Encounter (Signed)
Pt mentioned that he would like to speak with Dr. Meda Coffee or nurse about how he has been feeling.

## 2020-10-03 DIAGNOSIS — J0100 Acute maxillary sinusitis, unspecified: Secondary | ICD-10-CM | POA: Diagnosis not present

## 2020-10-03 DIAGNOSIS — K219 Gastro-esophageal reflux disease without esophagitis: Secondary | ICD-10-CM | POA: Diagnosis not present

## 2020-10-05 ENCOUNTER — Other Ambulatory Visit: Payer: Self-pay

## 2020-10-05 ENCOUNTER — Encounter (HOSPITAL_COMMUNITY): Payer: Self-pay | Admitting: Emergency Medicine

## 2020-10-05 ENCOUNTER — Emergency Department (HOSPITAL_COMMUNITY): Payer: BC Managed Care – PPO

## 2020-10-05 ENCOUNTER — Emergency Department (HOSPITAL_COMMUNITY)
Admission: EM | Admit: 2020-10-05 | Discharge: 2020-10-05 | Disposition: A | Discharge status: Home / Self Care | Payer: BC Managed Care – PPO | Attending: Emergency Medicine | Admitting: Emergency Medicine

## 2020-10-05 DIAGNOSIS — Z951 Presence of aortocoronary bypass graft: Secondary | ICD-10-CM | POA: Diagnosis not present

## 2020-10-05 DIAGNOSIS — Z20822 Contact with and (suspected) exposure to covid-19: Secondary | ICD-10-CM | POA: Insufficient documentation

## 2020-10-05 DIAGNOSIS — R079 Chest pain, unspecified: Secondary | ICD-10-CM | POA: Insufficient documentation

## 2020-10-05 DIAGNOSIS — I130 Hypertensive heart and chronic kidney disease with heart failure and stage 1 through stage 4 chronic kidney disease, or unspecified chronic kidney disease: Secondary | ICD-10-CM | POA: Insufficient documentation

## 2020-10-05 DIAGNOSIS — N183 Chronic kidney disease, stage 3 unspecified: Secondary | ICD-10-CM | POA: Diagnosis not present

## 2020-10-05 DIAGNOSIS — I2510 Atherosclerotic heart disease of native coronary artery without angina pectoris: Secondary | ICD-10-CM | POA: Insufficient documentation

## 2020-10-05 DIAGNOSIS — Z79899 Other long term (current) drug therapy: Secondary | ICD-10-CM | POA: Diagnosis not present

## 2020-10-05 DIAGNOSIS — Z7901 Long term (current) use of anticoagulants: Secondary | ICD-10-CM | POA: Insufficient documentation

## 2020-10-05 DIAGNOSIS — I509 Heart failure, unspecified: Secondary | ICD-10-CM | POA: Diagnosis not present

## 2020-10-05 DIAGNOSIS — I10 Essential (primary) hypertension: Secondary | ICD-10-CM | POA: Diagnosis not present

## 2020-10-05 DIAGNOSIS — I5042 Chronic combined systolic (congestive) and diastolic (congestive) heart failure: Secondary | ICD-10-CM | POA: Diagnosis not present

## 2020-10-05 DIAGNOSIS — I4891 Unspecified atrial fibrillation: Secondary | ICD-10-CM | POA: Insufficient documentation

## 2020-10-05 DIAGNOSIS — R110 Nausea: Secondary | ICD-10-CM | POA: Insufficient documentation

## 2020-10-05 DIAGNOSIS — R0602 Shortness of breath: Secondary | ICD-10-CM | POA: Insufficient documentation

## 2020-10-05 DIAGNOSIS — N189 Chronic kidney disease, unspecified: Secondary | ICD-10-CM | POA: Diagnosis not present

## 2020-10-05 DIAGNOSIS — R0789 Other chest pain: Secondary | ICD-10-CM

## 2020-10-05 LAB — CBC
HCT: 46.4 % (ref 39.0–52.0)
Hemoglobin: 15 g/dL (ref 13.0–17.0)
MCH: 33.3 pg (ref 26.0–34.0)
MCHC: 32.3 g/dL (ref 30.0–36.0)
MCV: 102.9 fL — ABNORMAL HIGH (ref 80.0–100.0)
Platelets: 68 10*3/uL — ABNORMAL LOW (ref 150–400)
RBC: 4.51 MIL/uL (ref 4.22–5.81)
RDW: 13.8 % (ref 11.5–15.5)
WBC: 5.6 10*3/uL (ref 4.0–10.5)
nRBC: 0 % (ref 0.0–0.2)

## 2020-10-05 LAB — BASIC METABOLIC PANEL
Anion gap: 9 (ref 5–15)
BUN: 23 mg/dL — ABNORMAL HIGH (ref 6–20)
CO2: 26 mmol/L (ref 22–32)
Calcium: 8.8 mg/dL — ABNORMAL LOW (ref 8.9–10.3)
Chloride: 105 mmol/L (ref 98–111)
Creatinine, Ser: 1.67 mg/dL — ABNORMAL HIGH (ref 0.61–1.24)
GFR, Estimated: 50 mL/min — ABNORMAL LOW (ref >60)
Glucose, Bld: 127 mg/dL — ABNORMAL HIGH (ref 70–99)
Potassium: 3.7 mmol/L (ref 3.5–5.1)
Sodium: 140 mmol/L (ref 135–145)

## 2020-10-05 LAB — HEPATIC FUNCTION PANEL
ALT: 41 U/L (ref 0–44)
AST: 33 U/L (ref 15–41)
Albumin: 2.9 g/dL — ABNORMAL LOW (ref 3.5–5.0)
Alkaline Phosphatase: 134 U/L — ABNORMAL HIGH (ref 38–126)
Bilirubin, Direct: 0.1 mg/dL (ref 0.0–0.2)
Indirect Bilirubin: 0.8 mg/dL (ref 0.3–0.9)
Total Bilirubin: 0.9 mg/dL (ref 0.3–1.2)
Total Protein: 6 g/dL — ABNORMAL LOW (ref 6.5–8.1)

## 2020-10-05 LAB — RESPIRATORY PANEL BY RT PCR (FLU A&B, COVID)
Influenza A by PCR: NEGATIVE
Influenza B by PCR: NEGATIVE
SARS Coronavirus 2 by RT PCR: NEGATIVE

## 2020-10-05 LAB — LIPASE, BLOOD: Lipase: 38 U/L (ref 11–51)

## 2020-10-05 LAB — TROPONIN I (HIGH SENSITIVITY)
Troponin I (High Sensitivity): 3 ng/L (ref <18)
Troponin I (High Sensitivity): 4 ng/L (ref <18)

## 2020-10-05 MED ORDER — ALUM & MAG HYDROXIDE-SIMETH 200-200-20 MG/5ML PO SUSP
30.0000 mL | Freq: Once | ORAL | Status: AC
  Administered 2020-10-05: 30 mL via ORAL
  Filled 2020-10-05: qty 30

## 2020-10-05 MED ORDER — LIDOCAINE VISCOUS HCL 2 % MT SOLN
15.0000 mL | Freq: Once | OROMUCOSAL | Status: AC
  Administered 2020-10-05: 15 mL via ORAL
  Filled 2020-10-05: qty 15

## 2020-10-05 NOTE — ED Provider Notes (Signed)
Patient signed out to me by Shary Key, PA-C.  Please see previous notes for further history.  In brief, patient presenting for evaluation of chest pain.  Began on Tuesday.  Symptoms are worse when he first wakes up.  Associated nausea and vomiting.  Feels it is worse after meals and reminds him of his GERD symptoms.  Work-up in the ER has been so far overall reassuring.  Pending cardiology recommendations.  Discussed with Dr. Burt Knack from cardiology who feels patient is safe for discharge and that his pain is atypical and he has stable negative trops. he will arrange for outpatient follow-up, patient requesting GI information.  At this time, patient proceed for discharge.  Return precautions given.    Franchot Heidelberg, PA-C 10/05/20 1338    Valarie Merino, MD 10/06/20 1447

## 2020-10-05 NOTE — ED Provider Notes (Addendum)
[facility] EMERGENCY DEPARTMENT Provider Note   CSN: [id number] Arrival date & time: 10/05/20  0818     History Chief Complaint  Patient presents with   Chest Pain    Sean [name] is a 49 y.o. male with history of CAD s/p CABG, morbid obesity, hypertension, hyperlipidemia, paroxysmal A. fib on Cardizem and Eliquis, cholecystectomy, acid reflux presents to the ED for evaluation of chest pain.  This began on Tuesday morning while he was driving.  States that day he woke up feeling off.  Had intermittent nausea, chills and sweats and a little bit of shortness of breath throughout the day.  Specifically when he would unload his truck.  His chest pain was in the left side of his chest and upper abdomen, deep pressure and constant throughout the day.  Felt like it worsened after meals, but not always. Kind of feels like indigestion.  Two days before the pain began, he had a very large [place] meal.  Has taken Mylanta with mild improvement.  Yesterday he had tomato-based vegetable soup but states tomato usually worsens his acid reflux.  Has had diarrhea for the last 2 days.  Last night he  Mylanta and it felt like his symptoms were better as well.  This morning he woke up and noticed the chest pain came back around 5 am, has been constant since. Non pleuritic, non positional.  Feels like he may just be experiencing acid reflux but is afraid due to his cardiac history.  Over the last couple of days has also felt some sinus pressure, nasal congestion, neck pains, dry cough and overall just generalized malaise.  He had a PCP appointment who prescribed him doxycycline and prednisone for sinusitis two days ago.  Currently has 5/10 chest discomfort.  Denies radiation to the neck, arms.  No palpitations, lightheadedness, syncope.  No leg swelling.  Has been compliant with Eliquis.  States he had similar chest pain in the same area few years ago before he had his CABG.  This time he does  not have any jaw or arm pain. Had nitroglycerin at home but did not use it, states that one time he took it it did not help his chest pain several years ago.  HPI     Past Medical History:  Diagnosis Date   Abnormal liver enzymes    a. Sees a doctor in [place].   Cardiac cirrhosis    a. possible elevated LFTs/low platelets felt due to cardiac cirrhosis per 2017 admission.   Chronic combined systolic and diastolic CHF (congestive heart failure) (HCC)    CKD (chronic kidney disease), stage II    Congenital heart defect    a. rightward rotation of heart, almost dextrocardia   Coronary artery disease    a. s/p CABGx1 in 12/2015.   Hematuria    a. Chronic hx of this, no prior etiology determined through workup per patient.   Hypercholesteremia    a. Prev taken off statin due to abnormal liver function.   Hypertension    Morbid obesity ([place])    Paroxysmal atrial flutter ([place]) 02/12/2016   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   LIMA to LAD   Sinus bradycardia    Thrombocytopenia [facility])     Patient Active Problem List   Diagnosis Date Noted   Elevated ALT measurement 02/09/2018   Elevated AST (SGOT) 02/09/2018   Pseudoaneurysm following procedure ([place]) 05/11/2017   Elevated liver enzymes 01/23/2017   Chronic gout of multiple  sites 01/20/2017   Elevated TSH 11/27/2016   Elevated BUN 11/27/2016   Elevated serum creatinine 11/27/2016   Bronchitis 05/06/2016   Abnormal EKG 04/07/2016   Acute sinusitis 04/07/2016   Allergy to penicillin 04/07/2016   Atrial flutter ([place]) 04/07/2016   Coronary artery disease involving native coronary artery of native heart without angina pectoris 04/07/2016   Morbid obesity ([place])    Other long term (current) drug therapy 02/19/2016   Obstructive sleep apnea syndrome 02/19/2016   High risk medication use 02/19/2016   Atrial flutter with rapid ventricular response ([place]) 02/12/2016   Chronic coronary artery disease  01/04/2016   CKD (chronic kidney disease) stage 3, GFR 30-59 ml/min (HCC) 01/04/2016   Gout involving toe 01/04/2016   Morbid (severe) obesity due to excess calories ([place]) 01/04/2016   Morbid obesity with BMI of 45.0-49.9, adult ([place]) 01/04/2016   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   History of coronary artery bypass surgery 12/26/2015   CAD (coronary artery disease)    OSA (obstructive sleep apnea)    Unstable angina ([place])    Chest pain 12/23/2015   Benign essential hypertension 12/23/2015   Other and unspecified hyperlipidemia 12/23/2015   Renal insufficiency 12/23/2015   Thrombocytopenia ([place]) 12/23/2015   Hyperglycemia 12/23/2015   Sinus bradycardia 12/23/2015   Dyspepsia and other specified disorders of function of stomach 06/16/2014   Incisional hernia 07/10/2011    Past Surgical History:  Procedure Laterality Date   APPENDECTOMY     CARDIAC CATHETERIZATION N/A 12/24/2015   Procedure: Right/Left Heart Cath and Coronary Angiography;  Surgeon: Jolaine Artist, MD;  Location: [place] [facility];  Service: Cardiovascular;  Laterality: N/A;   CARDIOVERSION N/A 02/14/2016   Procedure: CARDIOVERSION;  Surgeon: Pixie Casino, MD;  Location: [facility] ENDOSCOPY;  Service: Cardiovascular;  Laterality: N/A;   CORONARY ARTERY BYPASS GRAFT N/A 12/26/2015   Procedure: Off Pump Coronary Artery Bypass Grafting times one using left internal mammary artery;  Surgeon: Rexene Alberts, MD;  Location: [place];  Service: Open Heart Surgery;  Laterality: N/A;   GALLBLADDER SURGERY     HERNIA REPAIR     LEFT HEART CATH AND CORS/GRAFTS ANGIOGRAPHY N/A 04/15/2017   Procedure: Left Heart Cath and Cors/Grafts Angiography;  Surgeon: Troy Sine, MD;  Location: [place] [facility];  Service: Cardiovascular;  Laterality: N/A;   TEE WITHOUT CARDIOVERSION N/A 12/26/2015   Procedure: TRANSESOPHAGEAL ECHOCARDIOGRAM (TEE);  Surgeon: Rexene Alberts, MD;  Location: [place];  Service: Open Heart  Surgery;  Laterality: N/A;   TEE WITHOUT CARDIOVERSION N/A 02/14/2016   Procedure: TRANSESOPHAGEAL ECHOCARDIOGRAM (TEE);  Surgeon: Pixie Casino, MD;  Location: [facility] ENDOSCOPY;  Service: Cardiovascular;  Laterality: N/A;       Family History  Problem Relation Age of Onset   CAD Father        2 stents ~ 54   Heart attack Other    Hyperlipidemia Other     Social History   Tobacco Use   Smoking status: Never Smoker   Smokeless tobacco: Never Used  Vaping Use   Vaping Use: Never used  Substance Use Topics   Alcohol use: Yes   Drug use: No    Home Medications Prior to Admission medications   Medication Sig Start Date End Date Taking? Authorizing Provider  allopurinol (ZYLOPRIM) 100 MG tablet Take 1 tablet (100 mg total) by mouth daily. 02/15/16   Bhagat, Crista Luria, PA  cetirizine (ZYRTEC) 10 MG tablet Take 10 mg by mouth  daily.    [provider]  diltiazem (CARDIZEM CD) 120 MG 24 hr capsule TAKE ONE CAPSULE BY MOUTH DAILY 04/25/20   Dorothy Spark, MD  ELIQUIS 5 MG TABS tablet TAKE 1 TABLET BY MOUTH TWICE DAILY. 08/23/18   Dorothy Spark, MD  ezetimibe (ZETIA) 10 MG tablet Take 1 tablet (10 mg total) by mouth daily. 09/26/19 12/25/19  Dorothy Spark, MD  famotidine (PEPCID) 40 MG tablet Take 40 mg by mouth daily.    [provider]  furosemide (LASIX) 40 MG tablet Take 40 mg by mouth as needed.    [provider]  Methylcobalamin (B-12) 5000 MCG TBDP Take 5 mcg by mouth daily.    [provider]  montelukast (SINGULAIR) 10 MG tablet Take 10 mg by mouth daily.    [provider]  nitroGLYCERIN (NITROSTAT) 0.4 MG SL tablet Place 1 tablet (0.4 mg total) under the tongue every 5 (five) minutes as needed for chest pain. 02/15/16   Bhagat, Crista Luria, PA  pantoprazole (PROTONIX) 40 MG tablet TAKE 1 TABLET EVERY MORNING. 06/21/18   Dorothy Spark, MD  potassium chloride (K-DUR,KLOR-CON) 10 MEQ tablet TAKE 1 TABLET ONCE  DAILY. 06/21/18   Dorothy Spark, MD  REPATHA SURECLICK 665 MG/ML SOAJ INJECT THE CONTENTS OF 1 SYRINGE UNDER THE SKIN EVERY 14 DAYS 12/14/19   Dorothy Spark, MD  sucralfate (CARAFATE) 1 g tablet Take 1 g by mouth daily. For 14 days    [provider]    Allergies    Imdur [isosorbide dinitrate], Augmentin [amoxicillin-pot clavulanate], and Tape  Review of Systems   Review of Systems  Constitutional: Positive for chills.  HENT: Positive for congestion.   Cardiovascular: Positive for chest pain.  Gastrointestinal: Positive for abdominal pain, diarrhea and nausea.  All other systems reviewed and are negative.   Physical Exam Updated Vital Signs BP 134/82    Pulse 65    Temp 98.4 F (36.9 C) (Oral)    Resp 16    Ht 5\' 11"  (1.803 m)    Wt (!) 163.3 kg    SpO2 99%    BMI 50.21 kg/m   Physical Exam Vitals and nursing note reviewed.  Constitutional:      General: He is not in acute distress.    Appearance: He is well-developed.     Comments: NAD.  HENT:     Head: Normocephalic and atraumatic.     Right Ear: External ear normal.     Left Ear: External ear normal.     Nose: Nose normal.  Eyes:     General: No scleral icterus.    Conjunctiva/sclera: Conjunctivae normal.  Cardiovascular:     Rate and Rhythm: Normal rate and regular rhythm.     Heart sounds: Normal heart sounds.     Comments: Trace/minimal pitting edema distal lower legs.  No calf tenderness. Pulmonary:     Effort: Pulmonary effort is normal.     Breath sounds: Normal breath sounds.  Chest:     Chest wall: Tenderness present.  Abdominal:     Palpations: Abdomen is soft.     Tenderness: There is abdominal tenderness.     Comments: Epigastric, left upper quadrant tenderness.  Large cholecystectomy scar noted.  Umbilical hernia repair surgical scar noted.  Musculoskeletal:        General: No deformity. Normal range of motion.     Cervical back: Normal range of motion and neck supple.  Skin:  General: Skin is warm and dry.     Capillary Refill: Capillary refill takes less than 2 seconds.  Neurological:     Mental Status: He is alert and oriented to person, place, and time.  Psychiatric:        Behavior: Behavior normal.        Thought Content: Thought content normal.        Judgment: Judgment normal.     ED Results / Procedures / Treatments   Labs (all labs ordered are listed, but only abnormal results are displayed) Labs Reviewed  BASIC METABOLIC PANEL - Abnormal; Notable for the following components:      Result Value   Glucose, Bld 127 (*)    BUN 23 (*)    Creatinine, Ser 1.67 (*)    Calcium 8.8 (*)    GFR, Estimated 50 (*)    All other components within normal limits  CBC - Abnormal; Notable for the following components:   MCV 102.9 (*)    Platelets 68 (*)    All other components within normal limits  HEPATIC FUNCTION PANEL - Abnormal; Notable for the following components:   Total Protein 6.0 (*)    Albumin 2.9 (*)    Alkaline Phosphatase 134 (*)    All other components within normal limits  RESPIRATORY PANEL BY RT PCR (FLU A&B, COVID)  LIPASE, BLOOD  TROPONIN I (HIGH SENSITIVITY)  TROPONIN I (HIGH SENSITIVITY)    EKG EKG Interpretation  Date/Time:  Friday October 05 2020 08:15:39 EDT Ventricular Rate:  73 PR Interval:  164 QRS Duration: 100 QT Interval:  368 QTC Calculation: 405 R Axis:   16 Text Interpretation: Normal sinus rhythm Left ventricular hypertrophy with repolarization abnormality ( R in aVL , Romhilt-Estes ) Possible Inferior infarct , age undetermined Abnormal ECG Confirmed by Dene Gentry [phone]) on 10/05/2020 8:48:04 AM   Radiology DG Chest 2 View  Result Date: 10/05/2020 CLINICAL DATA:  LEFT side chest pain for 3 days, history CABG, hypertension, CHF, CKD EXAM: CHEST - 2 VIEW COMPARISON:  02/26/2016 FINDINGS: Mild enlargement of cardiac silhouette post median sternotomy. Mediastinal contours and pulmonary vascularity normal.  Chronic elevation LEFT diaphragm with minimal chronic basilar atelectasis. Lungs otherwise clear. No acute infiltrate, pleural effusion, or pneumothorax. Osseous structures unremarkable. IMPRESSION: Enlargement of cardiac silhouette post CABG. Minimal chronic LEFT basilar atelectasis. No acute abnormalities. Electronically Signed   By: Lavonia Dana M.D.   On: 10/05/2020 09:05    Procedures Procedures (including critical care time)  Medications Ordered in ED Medications  alum & mag hydroxide-simeth (MAALOX/MYLANTA) 200-200-20 MG/5ML suspension 30 mL (30 mLs Oral Given 10/05/20 0906)    And  lidocaine (XYLOCAINE) 2 % viscous mouth solution 15 mL (15 mLs Oral Given 10/05/20 4128)    ED Course  I have reviewed the triage vital signs and the nursing notes.  Pertinent labs & imaging results that were available during my care of the patient were reviewed by me and considered in my medical decision making (see chart for details).  Clinical Course as of Oct 06 1219  Fri Oct 05, 2020  7867 LVH Q waves in inferior leads NSR    [CG]  0907 IMPRESSION: Enlargement of cardiac silhouette post CABG. Minimal chronic LEFT basilar atelectasis No acute abnormalities.    DG Chest 2 View [CG]  0930 Re-evaluated patient, reports CP better after GI cocktail    [CG]  1001 Alkaline Phosphatase(!): 134 [CG]  1001 Creatinine(!): 1.67 [CG]  1001 BUN(!): 23 [CG]  1001 Glucose(!): 127 [CG]  1001 Troponin I (High Sensitivity): 3 [CG]    Clinical Course User Index [CG] Arlean Hopping   MDM Rules/Calculators/A&P                          EMR triage and nursing notes reviewed to obtain more history and assist with MDM  LHC May 2018 with stable CAD. Echo with LVEF 45-50%. Sees Dr Meda Coffee [facility] heart care.  Saw PCP two days ago, patient had not picked up carafate until then. CABG 2017, patient had normal initial troponin in ER.   Ddx: ACS/NSTEMI/angina, atypical chest pain, GERD/gastritis, viral illness  given other URI symptoms, diarrhea, COVID.  PE considered less likely given compliance with Eliquis. H/o Cholecystectomy.   Labs ordered: CBC BMP troponin by triage RN. I ordered lipase, LFT.   Imaging ordered: chest x-ray, EKG.  Medicines ordered: GI cocktail   Ordered cardiac and pulse ox monitoring   1110: Patient reevaluated and reports minimal improvement in pain after GI cocktail.  ER work-up is benign.  EKG without acute ischemic changes, signs of pericarditis, right ventricular strain, arrhythmias.  Troponin x2 undetectable.  Chest x-ray without infiltrates, edema.  Lab work otherwise unremarkable.  Patient has elevated heart score, several risk factors.  However symptoms sound somewhat atypical.  He had negative troponins in ER the day before he had his CABG in 2017.  Discussed with EDP who recommends cardiology consult here in the ED.  I spoke to [place] who states cardiology will see patient in the ED.  Updated patient who is in agreement with POC. COVID pending.    Final Clinical Impression(s) / ED Diagnoses Final diagnoses:  Chest pain, unspecified type    Rx / DC Orders ED Discharge Orders    None       Arlean Hopping 10/05/20 1220    Valarie Merino, MD 10/09/20 [phone]

## 2020-10-05 NOTE — Consult Note (Addendum)
Cardiology Consultation:   Patient ID: Kacee [name] Enck; [id number]; 10/18/1971   Admit date: 10/05/2020 Date of Consult: 10/05/2020  Primary Care Provider: Raina Mina., MD Primary Cardiologist: Ena Dawley, MD   Patient Profile:   Damontae [name] Cutting is a 49 y.o. male with a hx of a hx of morbid obesity with BMI at 3, HTN, HLD, CAD s/p off pump CABG x1 with LIMA to LAD in 12/2015 (last LHC 03/2017 with stable CAD), hx of paroxysmal a fib/flutter on Eliquis with prior DCCV and CKD stage III/IV who is being seen today for the evaluation of chest/epigastric pain at the request of Dr. Donney Rankins.   History of Present Illness:   Mr. Lafoy is a 49yo M with a hx as stated above who presented to [facility] 10/05/20 with a 4-day hx of epigastric pain which is slightly reminiscent of his prior angina in 2017. He states that he has a long hx of GERD and GI issues for which hew was to have a colonoscopy performed but he was reluctant to be off his anticoagulation therefore he deferred. He states that for many years he will wake up and be nauseated first thing in the morning. Most mornings he will vomit at least once and will typically feel better with in 15-20 minutes. He has had issues with lower abdominal pain as well. He states that he always has abdominal issues with food ingestion. He has recently had diarrhea on several occasions but no blood that he can see. More recently he states that he woke up on Tuesday morning and had an episode as described above. He also states he had some nasal drainage and a headache. He was fearful that he had COVID (he has been fully vaccinated). He states that he took Mylanta with some improvement. 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 He called the office and it was recommended that he go to the ED. Given that he was in [place], he deferred.   He then had a telemedicine visit with his PCP on Wednesday at which time he was  prescribed Sucralfate and an antibiotic for possible sinus infection. He reports that he stayed home Thursday and felt somewhat better. He then woke up this morning around 0330 to use the restroom and felt good however up waking at 0530 he states that the epigastric pain had returned. He then came to the ED for further evaluation.  In the ED, hsT found to be negative at 3>>>4. EKG without acute changes however appears there are inferolateral changes when compared to tracing from 08/13/20. Creatinine elevated at 1.67 which appears to be more elevated than his baseline at 1.3-1.5. CXR that is essentially normal with minimal chronic left basialr atelectasis.   Past Medical History:  Diagnosis Date  . Abnormal liver enzymes    a. Sees a doctor in [place].  . Cardiac cirrhosis    a. possible elevated LFTs/low platelets felt due to cardiac cirrhosis per 2017 admission.  . Chronic combined systolic and diastolic CHF (congestive heart failure) ([place])   . CKD (chronic kidney disease), stage II   . Congenital heart defect    a. rightward rotation of heart, almost dextrocardia  . Coronary artery disease    a. s/p CABGx1 in 12/2015.  Marland Kitchen Hematuria    a. Chronic hx of this, no prior etiology determined through workup per patient.  . Hypercholesteremia    a. Prev taken off statin due to  abnormal liver function.  . Hypertension   . Morbid obesity ([place])   . Paroxysmal atrial flutter ([place]) 02/12/2016  . 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 Sinus bradycardia   . Thrombocytopenia ([place])     Past Surgical History:  Procedure Laterality Date  . APPENDECTOMY    . CARDIAC CATHETERIZATION N/A 12/24/2015   Procedure: Right/Left Heart Cath and Coronary Angiography;  Surgeon: Jolaine Artist, MD;  Location: [place] CV LAB;  Service: Cardiovascular;  Laterality: N/A;  . CARDIOVERSION N/A 02/14/2016   Procedure: CARDIOVERSION;  Surgeon: Pixie Casino, MD;  Location: [facility] ENDOSCOPY;  Service:  Cardiovascular;  Laterality: N/A;  . CORONARY ARTERY BYPASS GRAFT N/A 12/26/2015   Procedure: Off Pump Coronary Artery Bypass Grafting times one using left internal mammary artery;  Surgeon: Rexene Alberts, MD;  Location: [place];  Service: Open Heart Surgery;  Laterality: N/A;  . GALLBLADDER SURGERY    . HERNIA REPAIR    . LEFT HEART CATH AND CORS/GRAFTS ANGIOGRAPHY N/A 04/15/2017   Procedure: Left Heart Cath and Cors/Grafts Angiography;  Surgeon: Troy Sine, MD;  Location: [place] CV LAB;  Service: Cardiovascular;  Laterality: N/A;  . TEE WITHOUT CARDIOVERSION N/A 12/26/2015   Procedure: TRANSESOPHAGEAL ECHOCARDIOGRAM (TEE);  Surgeon: Rexene Alberts, MD;  Location: [place];  Service: Open Heart Surgery;  Laterality: N/A;  . TEE WITHOUT CARDIOVERSION N/A 02/14/2016   Procedure: TRANSESOPHAGEAL ECHOCARDIOGRAM (TEE);  Surgeon: Pixie Casino, MD;  Location: [facility] ENDOSCOPY;  Service: Cardiovascular;  Laterality: N/A;     Prior to Admission medications   Medication Sig Start Date End Date Taking? Authorizing Provider  allopurinol (ZYLOPRIM) 100 MG tablet Take 1 tablet (100 mg total) by mouth daily. 02/15/16   Bhagat, Crista Luria, PA  cetirizine (ZYRTEC) 10 MG tablet Take 10 mg by mouth daily.    [provider]  diltiazem (CARDIZEM CD) 120 MG 24 hr capsule TAKE ONE CAPSULE BY MOUTH DAILY 04/25/20   Dorothy Spark, MD  ELIQUIS 5 MG TABS tablet TAKE 1 TABLET BY MOUTH TWICE DAILY. 08/23/18   Dorothy Spark, MD  ezetimibe (ZETIA) 10 MG tablet Take 1 tablet (10 mg total) by mouth daily. 09/26/19 12/25/19  Dorothy Spark, MD  famotidine (PEPCID) 40 MG tablet Take 40 mg by mouth daily.    [provider]  furosemide (LASIX) 40 MG tablet Take 40 mg by mouth as needed.    [provider]  Methylcobalamin (B-12) 5000 MCG TBDP Take 5 mcg by mouth daily.    [provider]  montelukast (SINGULAIR) 10 MG tablet Take 10 mg by mouth daily.    [provider]  nitroGLYCERIN (NITROSTAT) 0.4 MG SL tablet Place 1 tablet (0.4 mg total) under the tongue every 5 (five) minutes as needed for chest pain. 02/15/16   Bhagat, Crista Luria, PA  pantoprazole (PROTONIX) 40 MG tablet TAKE 1 TABLET EVERY MORNING. 06/21/18   Dorothy Spark, MD  potassium chloride (K-DUR,KLOR-CON) 10 MEQ tablet TAKE 1 TABLET ONCE DAILY. 06/21/18   Dorothy Spark, MD  REPATHA SURECLICK 962 MG/ML SOAJ INJECT THE CONTENTS OF 1 SYRINGE UNDER THE SKIN EVERY 14 DAYS 12/14/19   Dorothy Spark, MD  sucralfate (CARAFATE) 1 g tablet Take 1 g by mouth daily. For 14 days    [provider]    Inpatient Medications: Scheduled Meds:  Continuous Infusions:  PRN Meds:   Allergies:    Allergies  Allergen  Reactions  . Imdur [Isosorbide Dinitrate] Diarrhea    Pt states causes "diarrhea."  . Augmentin [Amoxicillin-Pot Clavulanate] Itching  . Tape Rash    Social History:   Social History   Socioeconomic History  . Marital status: Legally Separated    Spouse name: Not on file  . Number of children: Not on file  . Years of education: Not on file  . Highest education level: Not on file  Occupational History  . Occupation: Truck Geophysicist/field seismologist  Tobacco Use  . Smoking status: Never Smoker  . Smokeless tobacco: Never Used  Vaping Use  . Vaping Use: Never used  Substance and Sexual Activity  . Alcohol use: Yes  . Drug use: No  . Sexual activity: Not on file  Other Topics Concern  . Not on file  Social History Narrative  . Not on file   Social Determinants of Health   Financial Resource Strain:   . Difficulty of Paying Living Expenses: Not on file  Food Insecurity:   . Worried About Charity fundraiser in the Last Year: Not on file  . Ran Out of Food in the Last Year: Not on file  Transportation Needs:   . Lack of Transportation (Medical): Not on file  . Lack of Transportation (Non-Medical): Not on file  Physical Activity:   . Days of Exercise per Week: Not on  file  . Minutes of Exercise per Session: Not on file  Stress:   . Feeling of Stress : Not on file  Social Connections:   . Frequency of Communication with Friends and Family: Not on file  . Frequency of Social Gatherings with Friends and Family: Not on file  . Attends Religious Services: Not on file  . Active Member of Clubs or Organizations: Not on file  . Attends Archivist Meetings: Not on file  . Marital Status: Not on file  Intimate Partner Violence:   . Fear of Current or Ex-Partner: Not on file  . Emotionally Abused: Not on file  . Physically Abused: Not on file  . Sexually Abused: Not on file    Family History:   Family History  Problem Relation Age of Onset  . CAD Father        2 stents ~ 4  . Heart attack Other   . Hyperlipidemia Other    Family Status:  Family Status  Relation Name Status  . Mother  Alive  . Father  Alive  . Sister  Alive  . Other  (Not Specified)  . Other  (Not Specified)  . MGM  Deceased  . MGF  Deceased  . PGM  Deceased  . PGF  Deceased   ROS:  Please see the history of present illness.  All other ROS reviewed and negative.     Physical Exam/Data:   General: Obese, NAD Skin: Warm, dry, intact  Neck: Negative for carotid bruits. No JVD Lungs:Clear to ausculation bilaterally. No wheezes, rales, or rhonchi. Breathing is unlabored. Cardiovascular: RRR with S1 S2. No murmurs Abdomen: Soft, non-tender, distended. No obvious abdominal masses. Extremities: Mild edema. Radial pulses 2+ bilaterally Neuro: Alert and oriented. No focal deficits. No facial asymmetry. MAE spontaneously. Psych: Responds to questions appropriately with normal affect.    EKG:  The ECG that was done 10/05/20 was personally reviewed and demonstrates NSR with HR at 73bpm and inferolateral changes when compared to to 08/13/20 tracing   Vitals:   10/05/20 0821 10/05/20 0845  BP: (!) 167/87 134/82  Pulse: 72 65  Resp: 20 16  Temp: 98.4 F (36.9 C)     TempSrc: Oral   SpO2: 96% 99%  Weight: (!) 163.3 kg   Height: 5\' 11"  (1.803 m)    No intake or output data in the 24 hours ending 10/05/20 1304 Filed Weights   10/05/20 0821  Weight: (!) 163.3 kg   Body mass index is 50.21 kg/m.   Relevant CV Studies:  LHC 04/15/2017:   Ost LAD lesion, 100 %stenosed.  Prox RCA lesion, 15 %stenosed.  Mid LAD lesion, 25 %stenosed.  LIMA and is normal in caliber and anatomically normal.  The left ventricular ejection fraction is 50-55% by visual estimate.  The left ventricular systolic function is normal.  Normal LV function with an ejection fraction of 50-55% without definitive focal segmental wall motion abnormalities.  Significant native CAD with total occlusion of the LAD at the ostium; normal codominant left circumflex vessel, and RCA with smooth 10% luminal narrowing of the proximal vessel.  Patent LIMA graft supplying the LAD. There is a mild 25% smooth narrowing in the LAD beyond the anastomosis.  RECOMMENDATION: Medical therapy. Diagnostic Dominance: Right  Intervention   Lexiscan stress test 04/10/2017:   Nuclear stress EF: 52%. Septal hypokinesis  Inferior defect consistent with probable soft tissue attenuation (diaphragm, bowel activity) Apical thinning No signif ischemia or scar  Overall low risk scan   [facility] 12/24/2015:   Prox RCA lesion, 20% stenosed.  Mid RCA to Dist RCA lesion, 20% stenosed.  Ost LAD to Prox LAD lesion, 95% stenosed.  Mid LAD lesion, 40% stenosed.  Ost 1st Mrg to 1st Mrg lesion, 40% stenosed.  Assessment:  1. Severe 1v CAD with high-grade ostial LAD lesion (95%+) 2. 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 Elevated right sided pressures. Unable to complete RHC due to kinking of venous sheath RA 15 RV 44/18 4. Probable normal LV function   Plan/discussion:  He has high-grade ostial LAD disease in setting  of what appears to be congenital heart disease with possible dextrocardia and significantly elevated R-sided pressures. 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 Case reviewed with Dr. Gwenlyn Found. Will plan echo and possible CT to further evaluate. Although LAD lesion may be approachable percutaneously with low platelets long-term DAPT may not be best option. Will request TCTS consult for further planning once imaging studies completed.    Laboratory Data:  Chemistry Recent Labs  Lab 10/05/20 0829  NA 140  K 3.7  CL 105  CO2 26  GLUCOSE 127*  BUN 23*  CREATININE 1.67*  CALCIUM 8.8*  GFRNONAA 50*  ANIONGAP 9    Total Protein  Date Value Ref Range Status  10/05/2020 6.0 (L) 6.5 - 8.1 g/dL Final  08/13/2020 6.2 6.0 - 8.5 g/dL Final   Albumin  Date Value Ref Range Status  10/05/2020 2.9 (L) 3.5 - 5.0 g/dL Final  08/13/2020 3.7 (L) 4.0 - 5.0 g/dL Final   AST  Date Value Ref Range Status  10/05/2020 33 15 - 41 U/L Final   ALT  Date Value Ref Range Status  10/05/2020 41 0 - 44 U/L Final   Alkaline Phosphatase  Date Value Ref Range Status  10/05/2020 134 (H) 38 - 126 U/L Final   Total Bilirubin  Date Value Ref Range Status  10/05/2020 0.9 0.3 - 1.2 mg/dL Final   Bilirubin Total  Date Value Ref Range  Status  08/13/2020 0.6 0.0 - 1.2 mg/dL Final   Hematology Recent Labs  Lab 10/05/20 0829  WBC 5.6  RBC 4.51  HGB 15.0  HCT 46.4  MCV 102.9*  MCH 33.3  MCHC 32.3  RDW 13.8  PLT 68*   Cardiac EnzymesNo results for input(s): TROPONINI in the last 168 hours. No results for input(s): TROPIPOC in the last 168 hours.  BNPNo results for input(s): BNP, PROBNP in the last 168 hours.  DDimer No results for input(s): DDIMER in the last 168 hours. TSH:  Lab Results  Component Value Date   TSH 3.220 08/13/2020   Lipids: Lab Results  Component Value Date   CHOL 107 08/13/2020   HDL 49  08/13/2020   LDLCALC 37 08/13/2020   LDLDIRECT 45 10/21/2019   TRIG 118 08/13/2020   CHOLHDL 2.2 08/13/2020   HgbA1c: Lab Results  Component Value Date   HGBA1C 5.6 12/23/2015    Radiology/Studies:  DG Chest 2 View  Result Date: 10/05/2020 CLINICAL DATA:  LEFT side chest pain for 3 days, history CABG, hypertension, CHF, CKD EXAM: CHEST - 2 VIEW COMPARISON:  02/26/2016 FINDINGS: Mild enlargement of cardiac silhouette post median sternotomy. Mediastinal contours and pulmonary vascularity normal. Chronic elevation LEFT diaphragm with minimal chronic basilar atelectasis. Lungs otherwise clear. No acute infiltrate, pleural effusion, or pneumothorax. Osseous structures unremarkable. IMPRESSION: Enlargement of cardiac silhouette post CABG. Minimal chronic LEFT basilar atelectasis. No acute abnormalities. Electronically Signed   By: Lavonia Dana M.D.   On: 10/05/2020 09:05   Assessment and Plan:   1. Chest/epigastric pain with hx of CAD s/p CABGx1 12/2015: -Pt presented to [facility] with confounding symptoms that include a long hx of daily vomiting in the morning after waking, epigastric pain, however also include food exacerbations, diarrhea and sinus and nasal involvement. He then presented with a 4-day hx of epigastric pain which is slightly reminiscent of his prior angina in 2017. Most mornings he will vomit at least once and will typically feel better with in 15-20 minutes. He states that he always has abdominal issues with food ingestion. He has recently had diarrhea on several occasions but no blood that he can see. More recently he states that he woke up on Tuesday morning and had an episode as described above. He also states he had some nasal drainage and a headache. He states that he took Mylanta with some improvement. 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 He called the office and it was recommended that he go to the ED. Given that he was in [place], he deferred.   -He saw his PCP virtually on Wednesday at which time he was prescribed Sucralfate and an antibiotic for possible sinus infection. He reports that he stayed home Thursday and felt somewhat better. He then woke up this morning around 0330 to use the restroom and felt good however up waking at 0530 he states that the epigastric pain had returned.   -Unclear chest pain etiology at this time.  -High suspicion for GI involvement given the above symptoms with negative hsT.  -Last LHC 03/2017 with stable CAD with total occlusion of native LAD, normal LCX and 10% PCA lesion with improvement in LV at 50-55%. There was a 25% narrowing of the LIMA graft beyond the anastomosis with medical therapy recommended.  -Continue ASA>>no prior BB due to hx of bradycardia -Statin intolerant>>now on Repatha  -Given negative enzymes and no new EKG changes with symptoms consistent  with GI abnormalities. From a cardiac standpoint, patient may be discharged and can plan for OP stress test unless primary team wishes to keep him for further evaluation at which time we can plan stress test while here.   2. HTN: -Stable,167/87>>>134/82  -PTA diltiazem 120mg  QD  3. HLD: -Last LDL, 118 -Maintained on Repatha, Zetia -Intolerant to statins   4. CKD stage III/IV: -Creatinine, 1.67 -Baseline appears to be in the 1.4-1.5 range -Continue to avoid nephrotoxic medications   5. Obesity: -BMI, 50.2 -Ongoing weight gain, could benefit from weight loss   6. PAF: -Underwent DCCV 01/2016 and is maintained on diltiazem CD 120mg  QD -Anticoagulated with Eliquis 5mg  BID -CHA2DS2VASc =3 (HTN, vascular disease, CHF)  7. Chronic systolic CHF: -LVEF per echo noted to be 45-50% on 03/31/2016 -Appears euvolemic on exam  -Maintained on PRN Lasix    For questions or updates, please contact [place] Please consult [email or web address] for contact info under Cardiology/STEMI.   Lyndel Safe NP-C [facility] Pager:  [phone] 10/05/2020 1:04 PM  Patient seen, examined. Available data reviewed. Agree with findings, assessment, and plan as outlined by Kathyrn Drown, NP-C.  The patient is independently interviewed and examined.  He is a pleasant, obese gentleman in no acute distress.  HEENT is normal, JVP is normal, lungs are clear bilaterally, heart is regular rate and rhythm with no murmur gallop, abdomen is soft, obese, no masses, nontender, extremities have mild pretibial edema.  EKG shows evidence of LVH with nonspecific ST/T abnormality.  Troponins are normal.  The patient primarily complains of upper epigastric and lower chest discomfort unrelated to physical exertion.  Is worse symptoms were this past Tuesday (72 hours ago).  He has had daily morning vomiting for quite a long time.  The patient underwent coronary bypass surgery with a single LIMA to LAD graft in 2017, then had repeat cardiac catheterization in 2018 showing patency of his LIMA graft and no other obstructive disease.  His symptoms by history seem more consistent with GI etiology.  With normal troponin levels and atypical symptoms, I think he can be discharged from the emergency department with outpatient follow-up.  I have recommended a Lexiscan Myoview stress test for further risk stratification.  He also requests outpatient GI evaluation which can be arranged from the office.  I spoke to the emergency room provider and the patient will be discharged home with follow-up as outlined.  We will arrange a stress test and cardiology follow-up.  No medication changes are recommended today.  Sherren Mocha, M.D. 10/05/2020 2:21 PM

## 2020-10-05 NOTE — ED Triage Notes (Signed)
Pt arrives to ED with a chief complaint of left sided chest pressure that 3 days ago, he does have a history cardiac bypass involving his LAD in 2017. He states 3 days ago he had symptoms similar to prior to having this bypass. Currently is having pressure rated at 5/10 non radiating.

## 2020-10-05 NOTE — Discharge Instructions (Addendum)
Take Carafate as prescribed and decrease spicy food intake. Continue taking all your other home medications as prescribed. Follow-up with cardiology as discussed with the cardiologist today. There is information for a GI doctor listed below, you can follow-up with them for further evaluation of your frequent vomiting. Return to the emergency room with any new, worsening, concerning symptoms.

## 2020-10-08 ENCOUNTER — Telehealth: Payer: Self-pay | Admitting: *Deleted

## 2020-10-08 NOTE — Telephone Encounter (Signed)
  Patient seen, examined. Available data reviewed. Agree with findings, assessment, and plan as outlined by Kathyrn Drown, NP-C.  The patient is independently interviewed and examined.  He is a pleasant, obese gentleman in no acute distress.  HEENT is normal, JVP is normal, lungs are clear bilaterally, heart is regular rate and rhythm with no murmur gallop, abdomen is soft, obese, no masses, nontender, extremities have mild pretibial edema.  EKG shows evidence of LVH with nonspecific ST/T abnormality.  Troponins are normal.  The patient primarily complains of upper epigastric and lower chest discomfort unrelated to physical exertion.  Is worse symptoms were this past Tuesday (72 hours ago).  He has had daily morning vomiting for quite a long time.  The patient underwent coronary bypass surgery with a single LIMA to LAD graft in 2017, then had repeat cardiac catheterization in 2018 showing patency of his LIMA graft and no other obstructive disease.  His symptoms by history seem more consistent with GI etiology.  With normal troponin levels and atypical symptoms, I think he can be discharged from the emergency department with outpatient follow-up.  I have recommended a Lexiscan Myoview stress test for further risk stratification.  He also requests outpatient GI evaluation which can be arranged from the office.  I spoke to the emergency room provider and the patient will be discharged home with follow-up as outlined.  We will arrange a stress test and cardiology follow-up.  No medication changes are recommended today.  Sherren Mocha, M.D. 10/05/2020 2:21 PM   Per Discharge note from Dr. Burt Knack on this pt from recent ER visit on 11/5, he should be scheduled an appt with Dr. Meda Coffee or an APP for follow-up, and at this visit order a stress test.  Scheduled the pt to come in and see Dr. Meda Coffee for 10/10/20 at 0900.  Advised him to arrive 15 mins prior to this appt.  Pt verbalized understanding and agrees with  this plan.

## 2020-10-09 DIAGNOSIS — G4733 Obstructive sleep apnea (adult) (pediatric): Secondary | ICD-10-CM | POA: Diagnosis not present

## 2020-10-10 ENCOUNTER — Telehealth (HOSPITAL_COMMUNITY): Payer: Self-pay

## 2020-10-10 ENCOUNTER — Encounter: Payer: Self-pay | Admitting: *Deleted

## 2020-10-10 ENCOUNTER — Other Ambulatory Visit: Payer: Self-pay

## 2020-10-10 ENCOUNTER — Ambulatory Visit: Payer: BC Managed Care – PPO | Admitting: Cardiology

## 2020-10-10 ENCOUNTER — Encounter: Payer: Self-pay | Admitting: Cardiology

## 2020-10-10 VITALS — BP 152/76 | HR 62 | Ht 71.0 in | Wt 367.0 lb

## 2020-10-10 DIAGNOSIS — I2510 Atherosclerotic heart disease of native coronary artery without angina pectoris: Secondary | ICD-10-CM | POA: Diagnosis not present

## 2020-10-10 DIAGNOSIS — R072 Precordial pain: Secondary | ICD-10-CM

## 2020-10-10 DIAGNOSIS — R1013 Epigastric pain: Secondary | ICD-10-CM

## 2020-10-10 DIAGNOSIS — Z951 Presence of aortocoronary bypass graft: Secondary | ICD-10-CM

## 2020-10-10 DIAGNOSIS — I480 Paroxysmal atrial fibrillation: Secondary | ICD-10-CM | POA: Diagnosis not present

## 2020-10-10 DIAGNOSIS — E7849 Other hyperlipidemia: Secondary | ICD-10-CM

## 2020-10-10 NOTE — H&P (View-Only) (Signed)
Cardiology Office Note:    Date:  10/10/2020   ID:  Zyden [name] Cesaro, DOB 1970/12/27, MRN [id number]  PCP:  Raina Mina., MD  [facility] Cardiologist:  Ena Dawley, MD  [facility] Electrophysiologist:  None   Referring MD: Raina Mina., MD   Chief complaint: Post ER visit follow-up  History of Present Illness:    Sean [name] is a 49 y.o. male with a hx of hx of morbid obesity, hypertension, hyperlipidemia, CAD, s/p CABG CABG, LIMA to LAD in 12/2015. He had recurrent chest pain but cardiac cath in May 2018 with stable CAD.  Hx of a fib/flutter on Eliquis.  Hx of DCCV.  But is maintaining SR.    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  On Friday he presented to ER after 3-day of epigastric pain that felt like indigestion and was slightly relieved with belching, however when it persisted for 3 days he presented to ER.  He is EKG was unchanged, and troponins were negative x2.  He was discharged home for outpatient evaluation.  Today he states that he continues to have a mild epigastric pain, seems atypical however this is presentation before he had his bypass surgery, with exceptions at this time he does not have neck jaw or arm pain.  He denies any diaphoresis, no presyncope or syncope.  He has been compliant with his meds.  Past Medical History:  Diagnosis Date  . Abnormal liver enzymes    a. Sees a doctor in [place].  . Cardiac cirrhosis    a. possible elevated LFTs/low platelets felt due to cardiac cirrhosis per 2017 admission.  . Chronic combined systolic and diastolic CHF (congestive heart failure) ([place])   . CKD (chronic kidney disease), stage II   . Congenital heart defect    a. rightward rotation of heart, almost dextrocardia  . Coronary artery disease    a. s/p CABGx1 in 12/2015.  Marland Kitchen Hematuria    a. Chronic hx of this, no prior etiology determined through workup per  patient.  . Hypercholesteremia    a. Prev taken off statin due to abnormal liver function.  . Hypertension   . Morbid obesity ([place])   . Paroxysmal atrial flutter ([place]) 02/12/2016  . 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 Sinus bradycardia   . Thrombocytopenia ([place])     Past Surgical History:  Procedure Laterality Date  . APPENDECTOMY    . CARDIAC CATHETERIZATION N/A 12/24/2015   Procedure: Right/Left Heart Cath and Coronary Angiography;  Surgeon: Jolaine Artist, MD;  Location: [place] [facility];  Service: Cardiovascular;  Laterality: N/A;  . CARDIOVERSION N/A 02/14/2016   Procedure: CARDIOVERSION;  Surgeon: Pixie Casino, MD;  Location: [facility] ENDOSCOPY;  Service: Cardiovascular;  Laterality: N/A;  . CORONARY ARTERY BYPASS GRAFT N/A 12/26/2015   Procedure: Off Pump Coronary Artery Bypass Grafting times one using left internal mammary artery;  Surgeon: Rexene Alberts, MD;  Location: [place];  Service: Open Heart Surgery;  Laterality: N/A;  . GALLBLADDER SURGERY    . HERNIA REPAIR    . LEFT HEART CATH AND CORS/GRAFTS ANGIOGRAPHY N/A 04/15/2017   Procedure: Left Heart Cath and Cors/Grafts Angiography;  Surgeon: Troy Sine, MD;  Location: [place] [facility];  Service: Cardiovascular;  Laterality: N/A;  . TEE WITHOUT CARDIOVERSION N/A 12/26/2015   Procedure: TRANSESOPHAGEAL ECHOCARDIOGRAM (TEE);  Surgeon: Rexene Alberts, MD;  Location: [place];  Service: Open Heart Surgery;  Laterality: N/A;  . TEE WITHOUT CARDIOVERSION N/A 02/14/2016   Procedure: TRANSESOPHAGEAL ECHOCARDIOGRAM (TEE);  Surgeon: Pixie Casino, MD;  Location: [facility] ENDOSCOPY;  Service: Cardiovascular;  Laterality: N/A;    Current Medications: Current Meds  Medication Sig  . allopurinol (ZYLOPRIM) 100 MG tablet Take 1 tablet (100 mg total) by mouth daily.  . cetirizine (ZYRTEC) 10 MG tablet Take 10 mg by mouth daily.  Marland Kitchen diltiazem (CARDIZEM CD) 120 MG 24 hr capsule TAKE ONE CAPSULE BY MOUTH DAILY  . doxycycline  (VIBRAMYCIN) 100 MG capsule Take 100 mg by mouth 2 (two) times daily.  Marland Kitchen ELIQUIS 5 MG TABS tablet TAKE 1 TABLET BY MOUTH TWICE DAILY.  . furosemide (LASIX) 40 MG tablet Take 40 mg by mouth as needed for fluid.   . Methylcobalamin (B-12) 5000 MCG TBDP Take 5,000 mcg by mouth daily.   . montelukast (SINGULAIR) 10 MG tablet Take 10 mg by mouth daily.  . nitroGLYCERIN (NITROSTAT) 0.4 MG SL tablet Place 1 tablet (0.4 mg total) under the tongue every 5 (five) minutes as needed for chest pain.  . pantoprazole (PROTONIX) 40 MG tablet TAKE 1 TABLET EVERY MORNING.  Marland Kitchen potassium chloride (K-DUR,KLOR-CON) 10 MEQ tablet TAKE 1 TABLET ONCE DAILY.  Marland Kitchen predniSONE (DELTASONE) 20 MG tablet Take 20 mg by mouth daily.  Marland Kitchen REPATHA SURECLICK 528 MG/ML SOAJ INJECT THE CONTENTS OF 1 SYRINGE UNDER THE SKIN EVERY 14 DAYS  . sucralfate (CARAFATE) 1 g tablet Take 1 g by mouth 2 (two) times daily. For 14 days --     Allergies:   Imdur [isosorbide dinitrate], Augmentin [amoxicillin-pot clavulanate], and Tape   Social History   Socioeconomic History  . Marital status: Legally Separated    Spouse name: Not on file  . Number of children: Not on file  . Years of education: Not on file  . Highest education level: Not on file  Occupational History  . Occupation: Truck Geophysicist/field seismologist  Tobacco Use  . Smoking status: Never Smoker  . Smokeless tobacco: Never Used  Vaping Use  . Vaping Use: Never used  Substance and Sexual Activity  . Alcohol use: Yes  . Drug use: No  . Sexual activity: Not on file  Other Topics Concern  . Not on file  Social History Narrative  . Not on file   Social Determinants of Health   Financial Resource Strain:   . Difficulty of Paying Living Expenses: Not on file  Food Insecurity:   . Worried About Charity fundraiser in the Last Year: Not on file  . Ran Out of Food in the Last Year: Not on file  Transportation Needs:   . Lack of Transportation (Medical): Not on file  . Lack of Transportation  (Non-Medical): Not on file  Physical Activity:   . Days of Exercise per Week: Not on file  . Minutes of Exercise per Session: Not on file  Stress:   . Feeling of Stress : Not on file  Social Connections:   . Frequency of Communication with Friends and Family: Not on file  . Frequency of Social Gatherings with Friends and Family: Not on file  . Attends Religious Services: Not on file  . Active Member of Clubs or Organizations: Not on file  . Attends Archivist Meetings: Not on file  . Marital Status: Not on file     Family History: The patient's family history  includes CAD in his father; Heart attack in an other family member; Hyperlipidemia in an other family member.  ROS:   Please see the history of present illness.    All other systems reviewed and are negative.  EKGs/Labs/Other Studies Reviewed:    The following studies were reviewed today:  EKG:  EKG is ordered today.  The ekg ordered today demonstrates sinus rhythm 62 bpm, LVH, when compared to prior EKG his heart rate is now higher.  This was personally reviewed.  Recent Labs: 08/13/2020: TSH 3.220 10/05/2020: ALT 41; BUN 23; Creatinine, Ser 1.67; Hemoglobin 15.0; Platelets 68; Potassium 3.7; Sodium 140  Recent Lipid Panel    Component Value Date/Time   CHOL 107 08/13/2020 0909   TRIG 118 08/13/2020 0909   HDL 49 08/13/2020 0909   CHOLHDL 2.2 08/13/2020 0909   CHOLHDL 2.7 11/26/2016 0912   VLDL 18 11/26/2016 0912   LDLCALC 37 08/13/2020 0909   LDLDIRECT 45 10/21/2019 0942   Physical Exam:    VS:  BP (!) 152/76   Pulse 62   Ht 5\' 11"  (1.803 m)   Wt (!) 367 lb (166.5 kg)   SpO2 96%   BMI 51.19 kg/m     Wt Readings from Last 3 Encounters:  10/10/20 (!) 367 lb (166.5 kg)  10/05/20 (!) 360 lb (163.3 kg)  08/13/20 (!) 369 lb (167.4 kg)    GEN:  Obese HEENT: Normal NECK: No JVD; No carotid bruits LYMPHATICS: No lymphadenopathy CARDIAC: RRR, no murmurs, rubs, gallops RESPIRATORY:  Clear to  auscultation without rales, wheezing or rhonchi  ABDOMEN: Soft, non-tender, non-distended MUSCULOSKELETAL:  No edema; No deformity  SKIN: Warm and dry NEUROLOGIC:  Alert and oriented x 3 PSYCHIATRIC:  Normal affect    ASSESSMENT:    1. PAF (paroxysmal atrial fibrillation) ([place])   2. Precordial pain   3. Epigastric pain   4. Coronary artery disease involving native coronary artery of native heart without angina pectoris   5. Hx of CABG   6. Other hyperlipidemia      PLAN:    In order of problems listed above:  1. CAD, s/p CABG x1, LIMA to LAD in January 2017.  With new atypical symptoms, however similar to symptoms prior to his bypass surgery.  The plan would be to perform a stress test to rule out ischemia, and if normal we will refer him to GI for further evaluation and possible EGD. If abnormal we will plan for a left cardiac catheterization.  The patient understands that risks include but are not limited to stroke (1 in 1000), death (1 in 31), kidney failure [usually temporary] (1 in 500), bleeding (1 in 200), allergic reaction [possibly serious] (1 in 200), and agrees to proceed in case the stress test is abnormal.   2. Paroxsymal Atrial Flutter: s/p DCCV 02/26/16. Maintaining NSR. He is on anticoagulation with Eliquis and has no bleeding.  Hemoglobin has been normal, patient would like to be taken off Eliquis, inferior he only had one episode of atrial flutter and was very symptomatic but it.  I will order a 2-week Zio patch monitor to make sure he does not have episodes of flutter when he is not aware of it, if normal I will discontinue Eliquis.  3. Chronic Systolic HF: LVEF 29-93%, he is well compensated, Lasix only as needed.  4. OSA - sleep significantly improved on CPAP machine.  5. Morbid obesity with ongoing weight gain, the patient has poor insight into the problem.  6. Elevated LFTs -improved LFTs after switch to Repatha, also he has mild LFT elevation most probably  attributable to liver steatosis.  7. Hyperlipidemia, on Repatha and Zetia, most recent lipids in September 2021 at goal with triglycerides 118 and LDL 37.  Medication Adjustments/Labs and Tests Ordered: Current medicines are reviewed at length with the patient today.  Concerns regarding medicines are outlined above.  Orders Placed This Encounter  Procedures  . Ambulatory referral to Gastroenterology  . Myocardial Perfusion Imaging  . LONG TERM MONITOR (3-14 DAYS)  . EKG 12-Lead   No orders of the defined types were placed in this encounter.   Patient Instructions  Medication Instructions:   Your physician recommends that you continue on your current medications as directed. Please refer to the Current Medication list given to you today.  *If you need a refill on your cardiac medications before your next appointment, please call your pharmacy*   You have been referred to [facility]   Testing/Procedures:  Your physician has requested that you have a lexiscan myoview. For further information please visit [email or web address]. Please follow instruction sheet, as given.   ZIO XT- Long Term Monitor Instructions   Your physician has requested you wear your ZIO patch monitor__14_____days.   This is a single patch monitor.  [employer] supplies one patch monitor per enrollment.  Additional stickers are not available.   Please do not apply patch if you will be having a Nuclear Stress Test, Echocardiogram, Cardiac CT, MRI, or Chest Xray during the time frame you would be wearing the monitor. The patch cannot be worn during these tests.  You cannot remove and re-apply the ZIO XT patch monitor.   Your ZIO patch monitor will be sent USPS Priority mail from [facility] directly to your home address. The monitor may also be mailed to a PO BOX if home delivery is not available.   It may take 3-5 days to receive your monitor after you have been enrolled.     Once you have received you monitor, please review enclosed instructions.  Your monitor has already been registered assigning a specific monitor serial # to you.   Applying the monitor   Shave hair from upper left chest.   Hold abrader disc by orange tab.  Rub abrader in 40 strokes over left upper chest as indicated in your monitor instructions.   Clean area with 4 enclosed alcohol pads .  Use all pads to assure are is cleaned thoroughly.  Let dry.   Apply patch as indicated in monitor instructions.  Patch will be place under collarbone on left side of chest with arrow pointing upward.   Rub patch adhesive wings for 2 minutes.Remove white label marked "1".  Remove white label marked "2".  Rub patch adhesive wings for 2 additional minutes.   While looking in a mirror, press and release button in center of patch.  A small green light will flash 3-4 times .  This will be your only indicator the monitor has been turned on.     Do not shower for the first 24 hours.  You may shower after the first 24 hours.   Press button if you feel a symptom. You will hear a small click.  Record Date, Time and Symptom in the Patient Log Book.   When you are ready to remove patch, follow instructions on last 2 pages of Patient Log Book.  Stick patch monitor onto last page of Patient Log Book.  Place Patient Log Book in [place] box.  Use locking tab on box and tape box closed securely.  The [employer] has [employer] on it.  Please place in mailbox as soon as possible.  Your physician should have your test results approximately 7 days after the monitor has been mailed back to [facility].   Call [place] at [phone] if you have questions regarding your ZIO XT patch monitor.  Call them immediately if you see an orange light blinking on your monitor.   If your monitor falls off in less than 4 days contact our Monitor department at [phone].  If your monitor becomes loose  or falls off after 4 days call [employer] at [phone] for suggestions on securing your monitor.    Follow-Up:  3 MONTHS IN THE OFFICE WITH AN EXTENDER       Signed, Ena Dawley, MD  10/10/2020 10:20 AM    [place]

## 2020-10-10 NOTE — Progress Notes (Signed)
Patient ID: Sean [name], male   DOB: 30-Sep-1971, 49 y.o.   MRN: [id number] Patient enrolled for [employer] to ship a 14 day ZIO XT long term holter monitor to his home.

## 2020-10-10 NOTE — Progress Notes (Addendum)
Cardiology Office Note:    Date:  10/10/2020   ID:  Aikam [name] Barnwell, DOB 1971/11/21, MRN [id number]  PCP:  Raina Mina., MD  [facility] Cardiologist:  Ena Dawley, MD  [facility] Electrophysiologist:  None   Referring MD: Raina Mina., MD   Chief complaint: Post ER visit follow-up  History of Present Illness:    Sean [name] is a 49 y.o. male with a hx of hx of morbid obesity, hypertension, hyperlipidemia, CAD, s/p CABG CABG, LIMA to LAD in 12/2015. He had recurrent chest pain but cardiac cath in May 2018 with stable CAD.  Hx of a fib/flutter on Eliquis.  Hx of DCCV.  But is maintaining SR.    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  On Friday he presented to ER after 3-day of epigastric pain that felt like indigestion and was slightly relieved with belching, however when it persisted for 3 days he presented to ER.  He is EKG was unchanged, and troponins were negative x2.  He was discharged home for outpatient evaluation.  Today he states that he continues to have a mild epigastric pain, seems atypical however this is presentation before he had his bypass surgery, with exceptions at this time he does not have neck jaw or arm pain.  He denies any diaphoresis, no presyncope or syncope.  He has been compliant with his meds.  Past Medical History:  Diagnosis Date  . Abnormal liver enzymes    a. Sees a doctor in [place].  . Cardiac cirrhosis    a. possible elevated LFTs/low platelets felt due to cardiac cirrhosis per 2017 admission.  . Chronic combined systolic and diastolic CHF (congestive heart failure) ([place])   . CKD (chronic kidney disease), stage II   . Congenital heart defect    a. rightward rotation of heart, almost dextrocardia  . Coronary artery disease    a. s/p CABGx1 in 12/2015.  Marland Kitchen Hematuria    a. Chronic hx of this, no prior etiology determined through workup per  patient.  . Hypercholesteremia    a. Prev taken off statin due to abnormal liver function.  . Hypertension   . Morbid obesity ([place])   . Paroxysmal atrial flutter ([place]) 02/12/2016  . 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 Sinus bradycardia   . Thrombocytopenia ([place])     Past Surgical History:  Procedure Laterality Date  . APPENDECTOMY    . CARDIAC CATHETERIZATION N/A 12/24/2015   Procedure: Right/Left Heart Cath and Coronary Angiography;  Surgeon: Jolaine Artist, MD;  Location: [place] [facility];  Service: Cardiovascular;  Laterality: N/A;  . CARDIOVERSION N/A 02/14/2016   Procedure: CARDIOVERSION;  Surgeon: Pixie Casino, MD;  Location: [facility] ENDOSCOPY;  Service: Cardiovascular;  Laterality: N/A;  . CORONARY ARTERY BYPASS GRAFT N/A 12/26/2015   Procedure: Off Pump Coronary Artery Bypass Grafting times one using left internal mammary artery;  Surgeon: Rexene Alberts, MD;  Location: [place];  Service: Open Heart Surgery;  Laterality: N/A;  . GALLBLADDER SURGERY    . HERNIA REPAIR    . LEFT HEART CATH AND CORS/GRAFTS ANGIOGRAPHY N/A 04/15/2017   Procedure: Left Heart Cath and Cors/Grafts Angiography;  Surgeon: Troy Sine, MD;  Location: [place] [facility];  Service: Cardiovascular;  Laterality: N/A;  . TEE WITHOUT CARDIOVERSION N/A 12/26/2015   Procedure: TRANSESOPHAGEAL ECHOCARDIOGRAM (TEE);  Surgeon: Rexene Alberts, MD;  Location: [place];  Service: Open Heart Surgery;  Laterality: N/A;  . TEE WITHOUT CARDIOVERSION N/A 02/14/2016   Procedure: TRANSESOPHAGEAL ECHOCARDIOGRAM (TEE);  Surgeon: Pixie Casino, MD;  Location[facility]COPY;  Service: Cardiovascular;  Laterality: N/A;    Current Medications: Current Meds  Medication Sig  . allopurinol (ZYLOPRIM) 100 MG tablet Take 1 tablet (100 mg total) by mouth daily.  . cetirizine (ZYRTEC) 10 MG tablet Take 10 mg by mouth daily.  Marland Kitchen diltiazem (CARDIZEM CD) 120 MG 24 hr capsule TAKE ONE CAPSULE BY MOUTH DAILY  . doxycycline  (VIBRAMYCIN) 100 MG capsule Take 100 mg by mouth 2 (two) times daily.  Marland Kitchen ELIQUIS 5 MG TABS tablet TAKE 1 TABLET BY MOUTH TWICE DAILY.  . furosemide (LASIX) 40 MG tablet Take 40 mg by mouth as needed for fluid.   . Methylcobalamin (B-12) 5000 MCG TBDP Take 5,000 mcg by mouth daily.   . montelukast (SINGULAIR) 10 MG tablet Take 10 mg by mouth daily.  . nitroGLYCERIN (NITROSTAT) 0.4 MG SL tablet Place 1 tablet (0.4 mg total) under the tongue every 5 (five) minutes as needed for chest pain.  . pantoprazole (PROTONIX) 40 MG tablet TAKE 1 TABLET EVERY MORNING.  Marland Kitchen potassium chloride (K-DUR,KLOR-CON) 10 MEQ tablet TAKE 1 TABLET ONCE DAILY.  Marland Kitchen predniSONE (DELTASONE) 20 MG tablet Take 20 mg by mouth daily.  Marland Kitchen REPATHA SURECLICK 099 MG/ML SOAJ INJECT THE CONTENTS OF 1 SYRINGE UNDER THE SKIN EVERY 14 DAYS  . sucralfate (CARAFATE) 1 g tablet Take 1 g by mouth 2 (two) times daily. For 14 days --     Allergies:   Imdur [isosorbide dinitrate], Augmentin [amoxicillin-pot clavulanate], and Tape   Social History   Socioeconomic History  . Marital status: Legally Separated    Spouse name: Not on file  . Number of children: Not on file  . Years of education: Not on file  . Highest education level: Not on file  Occupational History  . Occupation: Truck Geophysicist/field seismologist  Tobacco Use  . Smoking status: Never Smoker  . Smokeless tobacco: Never Used  Vaping Use  . Vaping Use: Never used  Substance and Sexual Activity  . Alcohol use: Yes  . Drug use: No  . Sexual activity: Not on file  Other Topics Concern  . Not on file  Social History Narrative  . Not on file   Social Determinants of Health   Financial Resource Strain:   . Difficulty of Paying Living Expenses: Not on file  Food Insecurity:   . Worried About Charity fundraiser in the Last Year: Not on file  . Ran Out of Food in the Last Year: Not on file  Transportation Needs:   . Lack of Transportation (Medical): Not on file  . Lack of Transportation  (Non-Medical): Not on file  Physical Activity:   . Days of Exercise per Week: Not on file  . Minutes of Exercise per Session: Not on file  Stress:   . Feeling of Stress : Not on file  Social Connections:   . Frequency of Communication with Friends and Family: Not on file  . Frequency of Social Gatherings with Friends and Family: Not on file  . Attends Religious Services: Not on file  . Active Member of Clubs or Organizations: Not on file  . Attends Archivist Meetings: Not on file  . Marital Status: Not on file     Family History: The patient's family history  includes CAD in his father; Heart attack in an other family member; Hyperlipidemia in an other family member.  ROS:   Please see the history of present illness.    All other systems reviewed and are negative.  EKGs/Labs/Other Studies Reviewed:    The following studies were reviewed today:  EKG:  EKG is ordered today.  The ekg ordered today demonstrates sinus rhythm 62 bpm, LVH, when compared to prior EKG his heart rate is now higher.  This was personally reviewed.  Recent Labs: 08/13/2020: TSH 3.220 10/05/2020: ALT 41; BUN 23; Creatinine, Ser 1.67; Hemoglobin 15.0; Platelets 68; Potassium 3.7; Sodium 140  Recent Lipid Panel    Component Value Date/Time   CHOL 107 08/13/2020 0909   TRIG 118 08/13/2020 0909   HDL 49 08/13/2020 0909   CHOLHDL 2.2 08/13/2020 0909   CHOLHDL 2.7 11/26/2016 0912   VLDL 18 11/26/2016 0912   LDLCALC 37 08/13/2020 0909   LDLDIRECT 45 10/21/2019 0942   Physical Exam:    VS:  BP (!) 152/76   Pulse 62   Ht 5\' 11"  (1.803 m)   Wt (!) 367 lb (166.5 kg)   SpO2 96%   BMI 51.19 kg/m     Wt Readings from Last 3 Encounters:  10/10/20 (!) 367 lb (166.5 kg)  10/05/20 (!) 360 lb (163.3 kg)  08/13/20 (!) 369 lb (167.4 kg)    GEN:  Obese HEENT: Normal NECK: No JVD; No carotid bruits LYMPHATICS: No lymphadenopathy CARDIAC: RRR, no murmurs, rubs, gallops RESPIRATORY:  Clear to  auscultation without rales, wheezing or rhonchi  ABDOMEN: Soft, non-tender, non-distended MUSCULOSKELETAL:  No edema; No deformity  SKIN: Warm and dry NEUROLOGIC:  Alert and oriented x 3 PSYCHIATRIC:  Normal affect    ASSESSMENT:    1. PAF (paroxysmal atrial fibrillation) ([place])   2. Precordial pain   3. Epigastric pain   4. Coronary artery disease involving native coronary artery of native heart without angina pectoris   5. Hx of CABG   6. Other hyperlipidemia      PLAN:    In order of problems listed above:  1. CAD, s/p CABG x1, LIMA to LAD in January 2017.  With new atypical symptoms, however similar to symptoms prior to his bypass surgery.  The plan would be to perform a stress test to rule out ischemia, and if normal we will refer him to GI for further evaluation and possible EGD. If abnormal we will plan for a left cardiac catheterization.  The patient understands that risks include but are not limited to stroke (1 in 1000), death (1 in 52), kidney failure [usually temporary] (1 in 500), bleeding (1 in 200), allergic reaction [possibly serious] (1 in 200), and agrees to proceed in case the stress test is abnormal.   2. Paroxsymal Atrial Flutter: s/p DCCV 02/26/16. Maintaining NSR. He is on anticoagulation with Eliquis and has no bleeding.  Hemoglobin has been normal, patient would like to be taken off Eliquis, inferior he only had one episode of atrial flutter and was very symptomatic but it.  I will order a 2-week Zio patch monitor to make sure he does not have episodes of flutter when he is not aware of it, if normal I will discontinue Eliquis.  3. Chronic Systolic HF: LVEF 96-22%, he is well compensated, Lasix only as needed.  4. OSA - sleep significantly improved on CPAP machine.  5. Morbid obesity with ongoing weight gain, the patient has poor insight into the problem.  6. Elevated LFTs -improved LFTs after switch to Repatha, also he has mild LFT elevation most probably  attributable to liver steatosis.  7. Hyperlipidemia, on Repatha and Zetia, most recent lipids in September 2021 at goal with triglycerides 118 and LDL 37.  Medication Adjustments/Labs and Tests Ordered: Current medicines are reviewed at length with the patient today.  Concerns regarding medicines are outlined above.  Orders Placed This Encounter  Procedures  . Ambulatory referral to Gastroenterology  . Myocardial Perfusion Imaging  . LONG TERM MONITOR (3-14 DAYS)  . EKG 12-Lead   No orders of the defined types were placed in this encounter.   Patient Instructions  Medication Instructions:   Your physician recommends that you continue on your current medications as directed. Please refer to the Current Medication list given to you today.  *If you need a refill on your cardiac medications before your next appointment, please call your pharmacy*   You have been referred to [place]   Testing/Procedures:  Your physician has requested that you have a lexiscan myoview. For further information please visit [email or web address]. Please follow instruction sheet, as given.   ZIO XT- Long Term Monitor Instructions   Your physician has requested you wear your ZIO patch monitor__14_____days.   This is a single patch monitor.  Irhythm supplies one patch monitor per enrollment.  Additional stickers are not available.   Please do not apply patch if you will be having a Nuclear Stress Test, Echocardiogram, Cardiac CT, MRI, or Chest Xray during the time frame you would be wearing the monitor. The patch cannot be worn during these tests.  You cannot remove and re-apply the ZIO XT patch monitor.   Your ZIO patch monitor will be sent USPS Priority mail from [facility] directly to your home address. The monitor may also be mailed to a PO BOX if home delivery is not available.   It may take 3-5 days to receive your monitor after you have been enrolled.     Once you have received you monitor, please review enclosed instructions.  Your monitor has already been registered assigning a specific monitor serial # to you.   Applying the monitor   Shave hair from upper left chest.   Hold abrader disc by orange tab.  Rub abrader in 40 strokes over left upper chest as indicated in your monitor instructions.   Clean area with 4 enclosed alcohol pads .  Use all pads to assure are is cleaned thoroughly.  Let dry.   Apply patch as indicated in monitor instructions.  Patch will be place under collarbone on left side of chest with arrow pointing upward.   Rub patch adhesive wings for 2 minutes.Remove white label marked "1".  Remove white label marked "2".  Rub patch adhesive wings for 2 additional minutes.   While looking in a mirror, press and release button in center of patch.  A small green light will flash 3-4 times .  This will be your only indicator the monitor has been turned on.     Do not shower for the first 24 hours.  You may shower after the first 24 hours.   Press button if you feel a symptom. You will hear a small click.  Record Date, Time and Symptom in the Patient Log Book.   When you are ready to remove patch, follow instructions on last 2 pages of Patient Log Book.  Stick patch monitor onto last page of Patient Log Book.  Place Patient Log Book in [place] box.  Use locking tab on box and tape box closed securely.  The [employer] has [employer] on it.  Please place in mailbox as soon as possible.  Your physician should have your test results approximately 7 days after the monitor has been mailed back to [facility].   Call [place] at [phone] if you have questions regarding your ZIO XT patch monitor.  Call them immediately if you see an orange light blinking on your monitor.   If your monitor falls off in less than 4 days contact our Monitor department at [phone].  If your monitor becomes loose  or falls off after 4 days call [employer] at [phone] for suggestions on securing your monitor.    Follow-Up:  3 MONTHS IN THE OFFICE WITH AN EXTENDER       Signed, Ena Dawley, MD  10/10/2020 10:20 AM    [place]

## 2020-10-10 NOTE — Patient Instructions (Signed)
Medication Instructions:   Your physician recommends that you continue on your current medications as directed. Please refer to the Current Medication list given to you today.  *If you need a refill on your cardiac medications before your next appointment, please call your pharmacy*   You have been referred to [place]   Testing/Procedures:  Your physician has requested that you have a lexiscan myoview. For further information please visit [email or web address]. Please follow instruction sheet, as given.   ZIO XT- Long Term Monitor Instructions   Your physician has requested you wear your ZIO patch monitor__14_____days.   This is a single patch monitor.  Ir[employer] supplies one patch monitor per enrollment.  Additional stickers are not available.   Please do not apply patch if you will be having a Nuclear Stress Test, Echocardiogram, Cardiac CT, MRI, or Chest Xray during the time frame you would be wearing the monitor. The patch cannot be worn during these tests.  You cannot remove and re-apply the ZIO XT patch monitor.   Your ZIO patch monitor will be sent USPS Priority mail from [facility] directly to your home address. The monitor may also be mailed to a PO BOX if home delivery is not available.   It may take 3-5 days to receive your monitor after you have been enrolled.   Once you have received you monitor, please review enclosed instructions.  Your monitor has already been registered assigning a specific monitor serial # to you.   Applying the monitor   Shave hair from upper left chest.   Hold abrader disc by orange tab.  Rub abrader in 40 strokes over left upper chest as indicated in your monitor instructions.   Clean area with 4 enclosed alcohol pads .  Use all pads to assure are is cleaned thoroughly.  Let dry.   Apply patch as indicated in monitor instructions.  Patch will be place under collarbone on left side of chest with arrow  pointing upward.   Rub patch adhesive wings for 2 minutes.Remove white label marked "1".  Remove white label marked "2".  Rub patch adhesive wings for 2 additional minutes.   While looking in a mirror, press and release button in center of patch.  A small green light will flash 3-4 times .  This will be your only indicator the monitor has been turned on.     Do not shower for the first 24 hours.  You may shower after the first 24 hours.   Press button if you feel a symptom. You will hear a small click.  Record Date, Time and Symptom in the Patient Log Book.   When you are ready to remove patch, follow instructions on last 2 pages of Patient Log Book.  Stick patch monitor onto last page of Patient Log Book.   Place Patient Log Book in [place] box.  Use locking tab on box and tape box closed securely.  The [employer] has [employer] on it.  Please place in mailbox as soon as possible.  Your physician should have your test results approximately 7 days after the monitor has been mailed back to [facility].   Call [place] at [phone] if you have questions regarding your ZIO XT patch monitor.  Call them immediately if you see an orange light blinking on your monitor.   If your monitor falls off in less than 4 days contact our Monitor department at [phone].  If your monitor  becomes loose or falls off after 4 days call [employer] at [phone] for suggestions on securing your monitor.    Follow-Up:  3 MONTHS IN THE OFFICE WITH AN EXTENDER

## 2020-10-10 NOTE — Telephone Encounter (Signed)
Detailed instructions left on the patient's answering machine. Asked him to call back with any questions. S.Loie Jahr EMPT

## 2020-10-16 ENCOUNTER — Ambulatory Visit (HOSPITAL_COMMUNITY): Payer: BC Managed Care – PPO | Attending: Cardiology

## 2020-10-16 ENCOUNTER — Other Ambulatory Visit: Payer: Self-pay

## 2020-10-16 DIAGNOSIS — Z951 Presence of aortocoronary bypass graft: Secondary | ICD-10-CM | POA: Insufficient documentation

## 2020-10-16 DIAGNOSIS — J301 Allergic rhinitis due to pollen: Secondary | ICD-10-CM | POA: Diagnosis not present

## 2020-10-16 DIAGNOSIS — R072 Precordial pain: Secondary | ICD-10-CM | POA: Insufficient documentation

## 2020-10-16 DIAGNOSIS — G4733 Obstructive sleep apnea (adult) (pediatric): Secondary | ICD-10-CM | POA: Diagnosis not present

## 2020-10-16 DIAGNOSIS — E662 Morbid (severe) obesity with alveolar hypoventilation: Secondary | ICD-10-CM | POA: Diagnosis not present

## 2020-10-16 DIAGNOSIS — Z7189 Other specified counseling: Secondary | ICD-10-CM | POA: Diagnosis not present

## 2020-10-16 DIAGNOSIS — I2510 Atherosclerotic heart disease of native coronary artery without angina pectoris: Secondary | ICD-10-CM | POA: Insufficient documentation

## 2020-10-16 MED ORDER — REGADENOSON 0.4 MG/5ML IV SOLN
0.4000 mg | Freq: Once | INTRAVENOUS | Status: AC
  Administered 2020-10-16: 0.4 mg via INTRAVENOUS

## 2020-10-16 MED ORDER — TECHNETIUM TC 99M TETROFOSMIN IV KIT
31.0000 | PACK | Freq: Once | INTRAVENOUS | Status: AC | PRN
  Administered 2020-10-16: 31 via INTRAVENOUS
  Filled 2020-10-16: qty 31

## 2020-10-17 ENCOUNTER — Ambulatory Visit (HOSPITAL_COMMUNITY): Payer: BC Managed Care – PPO | Attending: Cardiovascular Disease

## 2020-10-17 LAB — MYOCARDIAL PERFUSION IMAGING
LV dias vol: 187 mL (ref 62–150)
LV sys vol: 74 mL
Peak HR: 78 {beats}/min
Rest HR: 54 {beats}/min
SDS: 7
SRS: 4
SSS: 11
TID: 1.05

## 2020-10-17 MED ORDER — TECHNETIUM TC 99M TETROFOSMIN IV KIT
30.8000 | PACK | Freq: Once | INTRAVENOUS | Status: AC | PRN
  Administered 2020-10-17: 30.8 via INTRAVENOUS
  Filled 2020-10-17: qty 31

## 2020-10-22 ENCOUNTER — Encounter: Payer: Self-pay | Admitting: *Deleted

## 2020-10-22 ENCOUNTER — Telehealth: Payer: Self-pay | Admitting: *Deleted

## 2020-10-22 DIAGNOSIS — I2510 Atherosclerotic heart disease of native coronary artery without angina pectoris: Secondary | ICD-10-CM

## 2020-10-22 DIAGNOSIS — R9439 Abnormal result of other cardiovascular function study: Secondary | ICD-10-CM

## 2020-10-22 DIAGNOSIS — Z951 Presence of aortocoronary bypass graft: Secondary | ICD-10-CM

## 2020-10-22 DIAGNOSIS — Z01812 Encounter for preprocedural laboratory examination: Secondary | ICD-10-CM

## 2020-10-22 NOTE — Telephone Encounter (Signed)
-----   Message from Dorothy Spark, MD sent at 10/22/2020  3:19 PM EST ----- I have reviewed his images and he does have ischemia in the anterior wall, could you please schedule him for cardiac catheterization.  Thank you.

## 2020-10-22 NOTE — Addendum Note (Signed)
Addended by: Dorothy Spark on: 10/22/2020 05:11 PM   Modules accepted: Orders, SmartSet

## 2020-10-22 NOTE — Telephone Encounter (Signed)
Spoke with the pt and endorsed to him his myoview results and recommendations per Dr. Meda Coffee, for him to have a cardiac cath scheduled.  Scheduled the pt to have a cardiac cath done on next Friday 11/02/20 at 1030 (be at [facility] at 0830), for Dr. Burt Knack to do. Pt will come in on Wednesday 10/31/20 to have pre-cath labs done (BMP/CBC), then report to scheduled covid test same day 12/1 at 1105.  Informed the pt that when he reports to his lab appt here at the office, there will be his cath instructions left for him to pick up at the front desk.  Went over cath instructions with the pt over the phone, as well as he is aware that his instructions will be in [place] and available for him to pick up at his 12/1 lab appt.  Pt is aware to take ASA 81 mg po the AM of this procedure.  Pt verbalized understanding and agrees with this plan. Will send Dr. Meda Coffee and pre-cert a message to make them aware of scheduled procedure and to place hospital orders.   Due to recent COVID-19 restrictions implemented by our local and state authorities and in an effort to keep both patients and staff as safe as possible, our hospital system requires COVID-19 testing prior to certain scheduled hospital procedures.  Please go to [place]. [place], [place] [id number] on 10/31/20 at 11:05 am  .  This is a drive up testing site.  You will not need to exit your vehicle.  You will not be billed at the time of testing but may receive a bill later depending on your insurance.  The approximate cost of the test is $100.  You must agree to self-quarantine from the time of your testing until the procedure date on 11/02/20.  This should included staying home with ONLY the people you live with.  Avoid take-out, grocery store shopping or leaving the house for any non-emergent reason.  Failure to have your COVID-19 test done on the date and time you have been scheduled will result in cancellation of your procedure.  Please call our office at  [phone] if you have any questions.

## 2020-10-31 ENCOUNTER — Other Ambulatory Visit (HOSPITAL_COMMUNITY)
Admission: RE | Admit: 2020-10-31 | Discharge: 2020-10-31 | Disposition: A | Discharge status: Home / Self Care | Payer: BC Managed Care – PPO | Source: Ambulatory Visit | Attending: Cardiovascular Disease | Admitting: Cardiovascular Disease

## 2020-10-31 ENCOUNTER — Other Ambulatory Visit: Payer: BC Managed Care – PPO

## 2020-10-31 ENCOUNTER — Other Ambulatory Visit: Payer: Self-pay

## 2020-10-31 ENCOUNTER — Telehealth: Payer: Self-pay | Admitting: *Deleted

## 2020-10-31 DIAGNOSIS — I5042 Chronic combined systolic (congestive) and diastolic (congestive) heart failure: Secondary | ICD-10-CM | POA: Diagnosis not present

## 2020-10-31 DIAGNOSIS — Z01812 Encounter for preprocedural laboratory examination: Secondary | ICD-10-CM | POA: Insufficient documentation

## 2020-10-31 DIAGNOSIS — E7849 Other hyperlipidemia: Secondary | ICD-10-CM | POA: Diagnosis not present

## 2020-10-31 DIAGNOSIS — G4733 Obstructive sleep apnea (adult) (pediatric): Secondary | ICD-10-CM | POA: Diagnosis not present

## 2020-10-31 DIAGNOSIS — I130 Hypertensive heart and chronic kidney disease with heart failure and stage 1 through stage 4 chronic kidney disease, or unspecified chronic kidney disease: Secondary | ICD-10-CM | POA: Diagnosis not present

## 2020-10-31 DIAGNOSIS — Z6841 Body Mass Index (BMI) 40.0 and over, adult: Secondary | ICD-10-CM | POA: Diagnosis not present

## 2020-10-31 DIAGNOSIS — I480 Paroxysmal atrial fibrillation: Secondary | ICD-10-CM | POA: Diagnosis not present

## 2020-10-31 DIAGNOSIS — R9439 Abnormal result of other cardiovascular function study: Secondary | ICD-10-CM

## 2020-10-31 DIAGNOSIS — Z20822 Contact with and (suspected) exposure to covid-19: Secondary | ICD-10-CM | POA: Insufficient documentation

## 2020-10-31 DIAGNOSIS — Z79899 Other long term (current) drug therapy: Secondary | ICD-10-CM | POA: Diagnosis not present

## 2020-10-31 DIAGNOSIS — R931 Abnormal findings on diagnostic imaging of heart and coronary circulation: Secondary | ICD-10-CM | POA: Diagnosis not present

## 2020-10-31 DIAGNOSIS — I2510 Atherosclerotic heart disease of native coronary artery without angina pectoris: Secondary | ICD-10-CM

## 2020-10-31 DIAGNOSIS — Z888 Allergy status to other drugs, medicaments and biological substances status: Secondary | ICD-10-CM | POA: Diagnosis not present

## 2020-10-31 DIAGNOSIS — Z881 Allergy status to other antibiotic agents status: Secondary | ICD-10-CM | POA: Diagnosis not present

## 2020-10-31 DIAGNOSIS — Z951 Presence of aortocoronary bypass graft: Secondary | ICD-10-CM | POA: Diagnosis not present

## 2020-10-31 DIAGNOSIS — Z7901 Long term (current) use of anticoagulants: Secondary | ICD-10-CM | POA: Diagnosis not present

## 2020-10-31 DIAGNOSIS — N182 Chronic kidney disease, stage 2 (mild): Secondary | ICD-10-CM | POA: Diagnosis not present

## 2020-10-31 LAB — CBC
Hematocrit: 42.5 % (ref 37.5–51.0)
Hemoglobin: 14.4 g/dL (ref 13.0–17.7)
MCH: 32.7 pg (ref 26.6–33.0)
MCHC: 33.9 g/dL (ref 31.5–35.7)
MCV: 96 fL (ref 79–97)
Platelets: 58 10*3/uL — CL (ref 150–450)
RBC: 4.41 x10E6/uL (ref 4.14–5.80)
RDW: 13.6 % (ref 11.6–15.4)
WBC: 3.7 10*3/uL (ref 3.4–10.8)

## 2020-10-31 LAB — BASIC METABOLIC PANEL
BUN/Creatinine Ratio: 13 (ref 9–20)
BUN: 22 mg/dL (ref 6–24)
CO2: 28 mmol/L (ref 20–29)
Calcium: 8.9 mg/dL (ref 8.7–10.2)
Chloride: 108 mmol/L — ABNORMAL HIGH (ref 96–106)
Creatinine, Ser: 1.64 mg/dL — ABNORMAL HIGH (ref 0.76–1.27)
GFR calc Af Amer: 57 mL/min/{1.73_m2} — ABNORMAL LOW (ref >59)
GFR calc non Af Amer: 49 mL/min/{1.73_m2} — ABNORMAL LOW (ref >59)
Glucose: 113 mg/dL — ABNORMAL HIGH (ref 65–99)
Potassium: 4.3 mmol/L (ref 3.5–5.2)
Sodium: 142 mmol/L (ref 134–144)

## 2020-10-31 LAB — SARS CORONAVIRUS 2 (TAT 6-24 HRS): SARS Coronavirus 2: NEGATIVE

## 2020-10-31 NOTE — Telephone Encounter (Signed)
Pt here for lab work prior to cath on Friday and while here wanted to talk to someone about a couple of incidences. Spoke to pt and Sun am noted bright red  blood in stool as well as again this am Pt also complaining of pain and minor itching Will let Dr Meda Coffee know .Pt is on Eliquis and was previously discussing having a colonoscopy but has declined fearful of having issues while having to hold Eliquis /cy

## 2020-10-31 NOTE — Telephone Encounter (Signed)
I strongly recommend to see a GI specialist after the cath

## 2020-11-01 ENCOUNTER — Telehealth: Payer: Self-pay | Admitting: *Deleted

## 2020-11-01 NOTE — Telephone Encounter (Signed)
RE: please call [facility] and schedule GI referral placed weeks ago per Meda Coffee Received: Today Sean Ly, LPN Patient is schedule for 11-05-20 @ 3:30 with Kenny-'Smith PA @ [place]/ unable to leave a message due to mail box not set up

## 2020-11-01 NOTE — Telephone Encounter (Signed)
Pt contacted pre-catheterization scheduled at [facility] for: Friday November 02, 2020 10:30 AM Verified arrival time and place: [facility]) at: 8:30 AM   No solid food after midnight prior to cath, clear liquids until 5 AM day of procedure.  Hold: Eliquis-last dose AM 10/31/20 until post procedure Lasix-day before and day of procedure-GFR 49  Except hold medications AM meds can be  taken pre-cath with sips of water including: ASA 81 mg   Confirmed patient has responsible adult to drive home post procedure and be with patient first 24 hours after arriving home: yes  You are allowed ONE visitor in the waiting room during the time you are at the hospital for your procedure. Both you and your visitor must wear a mask once you enter the hospital.       COVID-19 Pre-Screening Questions:  . In the past 14 days have you had any symptoms concerning for COVID-19 infection (fever, chills, cough, or new shortness of breath)? no . In the past 14 days have you been around anyone with known Covid 19? No  Reviewed procedure/mask/visitor instructions, COVID-19 questions with patient.

## 2020-11-01 NOTE — Telephone Encounter (Signed)
Spoke with the pt and informed him that per Dr. Meda Coffee, he should see a GI Specialist after the cath.  We already referred the pt to GI weeks ago, but he reports he has not heard anything from our office or theirs, in regards to scheduling him a new pt appt.  Informed the pt that I will follow-up with our [facility] schedulers to see if they can help expedite in calling their office and getting him a new pt consult appt with [facility], for the referral has been there for quite sometime.  Informed the pt that he will hear from them soon and he should arrange the appt when they call, for sometime next week or the following.  Pt verbalized understanding and agrees with this plan.

## 2020-11-02 ENCOUNTER — Other Ambulatory Visit: Payer: Self-pay

## 2020-11-02 ENCOUNTER — Ambulatory Visit (HOSPITAL_COMMUNITY)
Admission: RE | Admit: 2020-11-02 | Discharge: 2020-11-02 | Disposition: A | Discharge status: Home / Self Care | Payer: BC Managed Care – PPO | Attending: Cardiovascular Disease | Admitting: Cardiovascular Disease

## 2020-11-02 ENCOUNTER — Encounter (HOSPITAL_COMMUNITY)
Admission: RE | Disposition: A | Discharge status: Home / Self Care | Payer: Self-pay | Source: Home / Self Care | Attending: Cardiovascular Disease

## 2020-11-02 DIAGNOSIS — I2510 Atherosclerotic heart disease of native coronary artery without angina pectoris: Secondary | ICD-10-CM

## 2020-11-02 DIAGNOSIS — Z20822 Contact with and (suspected) exposure to covid-19: Secondary | ICD-10-CM | POA: Diagnosis not present

## 2020-11-02 DIAGNOSIS — Z951 Presence of aortocoronary bypass graft: Secondary | ICD-10-CM | POA: Diagnosis not present

## 2020-11-02 DIAGNOSIS — E7849 Other hyperlipidemia: Secondary | ICD-10-CM | POA: Insufficient documentation

## 2020-11-02 DIAGNOSIS — R072 Precordial pain: Secondary | ICD-10-CM

## 2020-11-02 DIAGNOSIS — Z7901 Long term (current) use of anticoagulants: Secondary | ICD-10-CM | POA: Insufficient documentation

## 2020-11-02 DIAGNOSIS — Z79899 Other long term (current) drug therapy: Secondary | ICD-10-CM | POA: Diagnosis not present

## 2020-11-02 DIAGNOSIS — Z881 Allergy status to other antibiotic agents status: Secondary | ICD-10-CM | POA: Diagnosis not present

## 2020-11-02 DIAGNOSIS — Z888 Allergy status to other drugs, medicaments and biological substances status: Secondary | ICD-10-CM | POA: Insufficient documentation

## 2020-11-02 DIAGNOSIS — N182 Chronic kidney disease, stage 2 (mild): Secondary | ICD-10-CM | POA: Insufficient documentation

## 2020-11-02 DIAGNOSIS — Z6841 Body Mass Index (BMI) 40.0 and over, adult: Secondary | ICD-10-CM | POA: Diagnosis not present

## 2020-11-02 DIAGNOSIS — I130 Hypertensive heart and chronic kidney disease with heart failure and stage 1 through stage 4 chronic kidney disease, or unspecified chronic kidney disease: Secondary | ICD-10-CM | POA: Insufficient documentation

## 2020-11-02 DIAGNOSIS — I5042 Chronic combined systolic (congestive) and diastolic (congestive) heart failure: Secondary | ICD-10-CM | POA: Diagnosis not present

## 2020-11-02 DIAGNOSIS — I480 Paroxysmal atrial fibrillation: Secondary | ICD-10-CM | POA: Insufficient documentation

## 2020-11-02 DIAGNOSIS — R931 Abnormal findings on diagnostic imaging of heart and coronary circulation: Secondary | ICD-10-CM | POA: Diagnosis not present

## 2020-11-02 DIAGNOSIS — G4733 Obstructive sleep apnea (adult) (pediatric): Secondary | ICD-10-CM | POA: Insufficient documentation

## 2020-11-02 HISTORY — PX: LEFT HEART CATH AND CORS/GRAFTS ANGIOGRAPHY: CATH118250

## 2020-11-02 SURGERY — LEFT HEART CATH AND CORS/GRAFTS ANGIOGRAPHY
Anesthesia: LOCAL

## 2020-11-02 MED ORDER — SODIUM CHLORIDE 0.9 % IV SOLN: INTRAVENOUS | Status: DC

## 2020-11-02 MED ORDER — ASPIRIN 81 MG PO CHEW: 81.0000 mg | CHEWABLE_TABLET | ORAL | Status: DC

## 2020-11-02 MED ORDER — ONDANSETRON HCL 4 MG/2ML IJ SOLN: 4.0000 mg | Freq: Four times a day (QID) | INTRAMUSCULAR | Status: DC | PRN

## 2020-11-02 MED ORDER — MIDAZOLAM HCL 2 MG/2ML IJ SOLN
INTRAMUSCULAR | Status: AC
  Filled 2020-11-02: qty 2

## 2020-11-02 MED ORDER — SODIUM CHLORIDE 0.9% FLUSH: 3.0000 mL | INTRAVENOUS | Status: DC | PRN

## 2020-11-02 MED ORDER — SODIUM CHLORIDE 0.9 % IV SOLN: 250.0000 mL | INTRAVENOUS | Status: DC | PRN

## 2020-11-02 MED ORDER — HEPARIN SODIUM (PORCINE) 1000 UNIT/ML IJ SOLN
INTRAMUSCULAR | Status: DC | PRN
  Administered 2020-11-02: 6000 [IU] via INTRAVENOUS

## 2020-11-02 MED ORDER — LABETALOL HCL 5 MG/ML IV SOLN: 10.0000 mg | INTRAVENOUS | Status: DC | PRN

## 2020-11-02 MED ORDER — HEPARIN (PORCINE) IN NACL 1000-0.9 UT/500ML-% IV SOLN
INTRAVENOUS | Status: AC
  Filled 2020-11-02: qty 1000

## 2020-11-02 MED ORDER — SODIUM CHLORIDE 0.9 % WEIGHT BASED INFUSION
3.0000 mL/kg/h | INTRAVENOUS | Status: AC
  Administered 2020-11-02: 3 mL/kg/h via INTRAVENOUS

## 2020-11-02 MED ORDER — IOHEXOL 350 MG/ML SOLN
INTRAVENOUS | Status: DC | PRN
  Administered 2020-11-02: 50 mL via INTRA_ARTERIAL

## 2020-11-02 MED ORDER — SODIUM CHLORIDE 0.9 % WEIGHT BASED INFUSION: 1.0000 mL/kg/h | INTRAVENOUS | Status: DC

## 2020-11-02 MED ORDER — FENTANYL CITRATE (PF) 100 MCG/2ML IJ SOLN
INTRAMUSCULAR | Status: AC
  Filled 2020-11-02: qty 2

## 2020-11-02 MED ORDER — FENTANYL CITRATE (PF) 100 MCG/2ML IJ SOLN
INTRAMUSCULAR | Status: DC | PRN
  Administered 2020-11-02: 50 ug via INTRAVENOUS
  Administered 2020-11-02: 25 ug via INTRAVENOUS

## 2020-11-02 MED ORDER — LIDOCAINE HCL (PF) 1 % IJ SOLN
INTRAMUSCULAR | Status: AC
  Filled 2020-11-02: qty 30

## 2020-11-02 MED ORDER — ACETAMINOPHEN 325 MG PO TABS
ORAL_TABLET | ORAL | Status: AC
  Filled 2020-11-02: qty 2

## 2020-11-02 MED ORDER — LIDOCAINE HCL (PF) 1 % IJ SOLN
INTRAMUSCULAR | Status: DC | PRN
  Administered 2020-11-02: 2 mL

## 2020-11-02 MED ORDER — HEPARIN SODIUM (PORCINE) 1000 UNIT/ML IJ SOLN
INTRAMUSCULAR | Status: AC
  Filled 2020-11-02: qty 1

## 2020-11-02 MED ORDER — IOHEXOL 350 MG/ML SOLN
INTRAVENOUS | Status: AC
  Filled 2020-11-02: qty 1

## 2020-11-02 MED ORDER — MIDAZOLAM HCL 2 MG/2ML IJ SOLN
INTRAMUSCULAR | Status: DC | PRN
  Administered 2020-11-02: 2 mg via INTRAVENOUS

## 2020-11-02 MED ORDER — VERAPAMIL HCL 2.5 MG/ML IV SOLN
INTRAVENOUS | Status: AC
  Filled 2020-11-02: qty 2

## 2020-11-02 MED ORDER — ACETAMINOPHEN 325 MG PO TABS
650.0000 mg | ORAL_TABLET | ORAL | Status: DC | PRN
  Administered 2020-11-02: 650 mg via ORAL

## 2020-11-02 MED ORDER — HEPARIN (PORCINE) IN NACL 1000-0.9 UT/500ML-% IV SOLN
INTRAVENOUS | Status: DC | PRN
  Administered 2020-11-02 (×2): 500 mL

## 2020-11-02 MED ORDER — HYDRALAZINE HCL 20 MG/ML IJ SOLN: 10.0000 mg | INTRAMUSCULAR | Status: DC | PRN

## 2020-11-02 MED ORDER — VERAPAMIL HCL 2.5 MG/ML IV SOLN
INTRAVENOUS | Status: DC | PRN
  Administered 2020-11-02: 10 mL via INTRA_ARTERIAL

## 2020-11-02 MED ORDER — SODIUM CHLORIDE 0.9% FLUSH: 3.0000 mL | Freq: Two times a day (BID) | INTRAVENOUS | Status: DC

## 2020-11-02 SURGICAL SUPPLY — 12 items
CATH 5FR JL3.5 JR4 ANG PIG MP (CATHETERS) ×1 IMPLANT
CATH EXPO 5F MPA-1 (CATHETERS) ×1 IMPLANT
DEVICE RAD COMP TR BAND LRG (VASCULAR PRODUCTS) ×1 IMPLANT
ELECT DEFIB PAD ADLT CADENCE (PAD) ×1 IMPLANT
GLIDESHEATH SLEND SS 6F .021 (SHEATH) ×1 IMPLANT
GUIDEWIRE INQWIRE 1.5J.035X260 (WIRE) ×1 IMPLANT
INQWIRE 1.5J .035X260CM (WIRE) ×2
KIT HEART LEFT (KITS) ×2 IMPLANT
PACK CARDIAC CATHETERIZATION (CUSTOM PROCEDURE TRAY) ×2 IMPLANT
SHEATH PROBE COVER 6X72 (BAG) ×1 IMPLANT
TRANSDUCER W/STOPCOCK (MISCELLANEOUS) ×2 IMPLANT
TUBING CIL FLEX 10 FLL-RA (TUBING) ×2 IMPLANT

## 2020-11-02 NOTE — Progress Notes (Signed)
Patient was given discharge instructions. He verified understanding.

## 2020-11-02 NOTE — Discharge Instructions (Signed)
Drink plenty of fluid for 48 hours and keep wrist elevated at heart level for 24 hours  Radial Site Care   This sheet gives you information about how to care for yourself after your procedure. Your health care provider may also give you more specific instructions. If you have problems or questions, contact your health care provider. What can I expect after the procedure? After the procedure, it is common to have:  Bruising and tenderness at the catheter insertion area. Follow these instructions at home: Medicines  Take over-the-counter and prescription medicines only as told by your health care provider. Insertion site care 1. Follow instructions from your health care provider about how to take care of your insertion site. Make sure you: ? Wash your hands with soap and water before you change your bandage (dressing). If soap and water are not available, use hand sanitizer. ? remove your dressing as told by your health care provider. In 24 hours 2. Check your insertion site every day for signs of infection. Check for: ? Redness, swelling, or pain. ? Fluid or blood. ? Pus or a bad smell. ? Warmth. 3. Do not take baths, swim, or use a hot tub until your health care provider approves. 4. You may shower 24-48 hours after the procedure, or as directed by your health care provider. ? Remove the dressing and gently wash the site with plain soap and water. ? Pat the area dry with a clean towel. ? Do not rub the site. That could cause bleeding. 5. Do not apply powder or lotion to the site. Activity   1. For 24 hours after the procedure, or as directed by your health care provider: ? Do not flex or bend the affected arm. ? Do not push or pull heavy objects with the affected arm. ? Do not drive yourself home from the hospital or clinic. You may drive 24 hours after the procedure unless your health care provider tells you not to. ? Do not operate machinery or power tools. 2. Do not lift  anything that is heavier than 10 lb (4.5 kg), or the limit that you are told, until your health care provider says that it is safe. For 4 days 3. Ask your health care provider when it is okay to: ? Return to work or school. ? Resume usual physical activities or sports. ? Resume sexual activity. General instructions  If the catheter site starts to bleed, raise your arm and put firm pressure on the site. If the bleeding does not stop, get help right away. This is a medical emergency.  If you went home on the same day as your procedure, a responsible adult should be with you for the first 24 hours after you arrive home.  Keep all follow-up visits as told by your health care provider. This is important. Contact a health care provider if:  You have a fever.  You have redness, swelling, or yellow drainage around your insertion site. Get help right away if:  You have unusual pain at the radial site.  The catheter insertion area swells very fast.  The insertion area is bleeding, and the bleeding does not stop when you hold steady pressure on the area.  Your arm or hand becomes pale, cool, tingly, or numb. These symptoms may represent a serious problem that is an emergency. Do not wait to see if the symptoms will go away. Get medical help right away. Call your local emergency services (911 in the [place] Do not   drive yourself to the hospital. Summary  After the procedure, it is common to have bruising and tenderness at the site.  Follow instructions from your health care provider about how to take care of your radial site wound. Check the wound every day for signs of infection.  Do not lift anything that is heavier than 10 lb (4.5 kg), or the limit that you are told, until your health care provider says that it is safe. This information is not intended to replace advice given to you by your health care provider. Make sure you discuss any questions you have with your health care  provider. Document Revised: 12/23/2017 Document Reviewed: 12/23/2017 Elsevier Patient Education  2020 Elsevier Inc.  

## 2020-11-02 NOTE — Interval H&P Note (Signed)
History and Physical Interval Note:  11/02/2020 10:42 AM  Sean [name]  has presented today for surgery, with the diagnosis of abn myoview.  The various methods of treatment have been discussed with the patient and family. After consideration of risks, benefits and other options for treatment, the patient has consented to  Procedure(s): LEFT HEART CATH AND CORS/GRAFTS ANGIOGRAPHY (N/A) as a surgical intervention.  The patient's history has been reviewed, patient examined, no change in status, stable for surgery.  I have reviewed the patient's chart and labs.  Questions were answered to the patient's satisfaction.     Sherren Mocha

## 2020-11-05 ENCOUNTER — Telehealth: Payer: Self-pay | Admitting: Gastroenterology

## 2020-11-05 ENCOUNTER — Ambulatory Visit: Payer: BC Managed Care – PPO | Admitting: Nurse Practitioner

## 2020-11-05 ENCOUNTER — Encounter (HOSPITAL_COMMUNITY): Payer: Self-pay | Admitting: Cardiovascular Disease

## 2020-11-05 NOTE — Telephone Encounter (Signed)
Hi Dr. Fuller Plan we have received a referral from patient's cardiologist for epigastric pain.  Patient was recently seen at [facility] but patient refers to have all his doctors within [facility].  GI records are in epic.  Please review them and advise on scheduling.  Thank you.

## 2020-11-06 NOTE — Telephone Encounter (Signed)
I am unable to accommodate a transfer of care at this time. Perhaps one of our other physicians has more availability.

## 2020-11-07 NOTE — Telephone Encounter (Signed)
Hi [name], please see the message below from Dr. Fuller Plan and let me know if you will be able to accept pt to send you his records for review. Thank you.

## 2020-11-07 NOTE — Telephone Encounter (Signed)
You may schedule with me. Thanks.

## 2020-11-08 NOTE — Telephone Encounter (Signed)
Thank you [name]. I will send you records as soon as I find them.

## 2020-11-20 ENCOUNTER — Encounter: Payer: Self-pay | Admitting: Gastroenterology

## 2020-12-10 ENCOUNTER — Other Ambulatory Visit: Payer: Self-pay | Admitting: Cardiology

## 2020-12-17 ENCOUNTER — Telehealth: Payer: Self-pay | Admitting: Cardiology

## 2020-12-17 NOTE — Telephone Encounter (Signed)
Pt c/o medication issue:  1. Name of Medication: REPATHA SURECLICK 951 MG/ML SOAJ  2. How are you currently taking this medication (dosage and times per day)? 1 injection every 14 days  3. Are you having a reaction (difficulty breathing--STAT)? no  4. What is your medication issue? Patient states his medication is costing $280 and would like to know if there is a way to get it cheaper.

## 2020-12-17 NOTE — Telephone Encounter (Signed)
Patient has Pharmacist, community.  Should be eligible for a copay card but I can not enroll him.  It keeps telling me the patient needs to call [phone] to activate copay card

## 2020-12-18 NOTE — Telephone Encounter (Signed)
Called and spoke w/pt and instructed him to call [phone] as there is nothing I can do but call them since he had a copay card in the past and cannot locate it now. Pt voiced gratitude and understanding

## 2021-01-02 ENCOUNTER — Other Ambulatory Visit: Payer: Self-pay

## 2021-01-02 ENCOUNTER — Encounter: Payer: Self-pay | Admitting: Gastroenterology

## 2021-01-02 ENCOUNTER — Other Ambulatory Visit (INDEPENDENT_AMBULATORY_CARE_PROVIDER_SITE_OTHER): Payer: BC Managed Care – PPO

## 2021-01-02 ENCOUNTER — Ambulatory Visit: Payer: BC Managed Care – PPO | Admitting: Gastroenterology

## 2021-01-02 ENCOUNTER — Telehealth: Payer: Self-pay

## 2021-01-02 VITALS — BP 134/82 | HR 65 | Ht 71.0 in | Wt 365.0 lb

## 2021-01-02 DIAGNOSIS — R1013 Epigastric pain: Secondary | ICD-10-CM

## 2021-01-02 DIAGNOSIS — Z7902 Long term (current) use of antithrombotics/antiplatelets: Secondary | ICD-10-CM

## 2021-01-02 DIAGNOSIS — G8929 Other chronic pain: Secondary | ICD-10-CM

## 2021-01-02 DIAGNOSIS — R112 Nausea with vomiting, unspecified: Secondary | ICD-10-CM

## 2021-01-02 DIAGNOSIS — D696 Thrombocytopenia, unspecified: Secondary | ICD-10-CM

## 2021-01-02 LAB — PROTIME-INR
INR: 1.2 ratio — ABNORMAL HIGH (ref 0.8–1.0)
Prothrombin Time: 13.2 s — ABNORMAL HIGH (ref 9.6–13.1)

## 2021-01-02 LAB — COMPREHENSIVE METABOLIC PANEL
ALT: 41 U/L (ref 0–53)
AST: 33 U/L (ref 0–37)
Albumin: 3.5 g/dL (ref 3.5–5.2)
Alkaline Phosphatase: 172 U/L — ABNORMAL HIGH (ref 39–117)
BUN: 21 mg/dL (ref 6–23)
CO2: 30 mEq/L (ref 19–32)
Calcium: 9.3 mg/dL (ref 8.4–10.5)
Chloride: 108 mEq/L (ref 96–112)
Creatinine, Ser: 1.6 mg/dL — ABNORMAL HIGH (ref 0.40–1.50)
GFR: 50.8 mL/min — ABNORMAL LOW (ref >60.00)
Glucose, Bld: 89 mg/dL (ref 70–99)
Potassium: 4.2 mEq/L (ref 3.5–5.1)
Sodium: 141 mEq/L (ref 135–145)
Total Bilirubin: 0.7 mg/dL (ref 0.2–1.2)
Total Protein: 6.5 g/dL (ref 6.0–8.3)

## 2021-01-02 LAB — CBC
HCT: 43 % (ref 39.0–52.0)
Hemoglobin: 14.6 g/dL (ref 13.0–17.0)
MCHC: 34 g/dL (ref 30.0–36.0)
MCV: 96.6 fl (ref 78.0–100.0)
Platelets: 74 10*3/uL — ABNORMAL LOW (ref 150.0–400.0)
RBC: 4.45 Mil/uL (ref 4.22–5.81)
RDW: 14.3 % (ref 11.5–15.5)
WBC: 5.4 10*3/uL (ref 4.0–10.5)

## 2021-01-02 NOTE — Patient Instructions (Addendum)
RECOMMENDATION(S):   I am recommending an abdominal ultrasound, an endoscopy and colonoscopy, as well as labs to better evaluate your symptoms.  LABS:   Your provider has requested that you go to the basement level for lab work before leaving today. Press "B" on the elevator. The lab is located at the first door on the left as you exit the elevator.  HEALTHCARE LAWS AND MY CHART RESULTS: Due to recent changes in healthcare laws, you may see the results of your imaging and laboratory studies on MyChart before your provider has had a chance to review them.  We understand that in some cases there may be results that are confusing or concerning to you. Not all laboratory results come back in the same time frame and the provider may be waiting for multiple results in order to interpret others.  Please give [place] 48 hours in order for your provider to thoroughly review all the results before contacting the office for clarification of your results.   COLONOSCOPY AND ENDOSCOPY:   You have been scheduled for an endoscopy and colonoscopy. Please follow the written instructions given to you at your visit today.  PREP:   Please pick up your prep supplies at the pharmacy within the next 1-3 days.  INHALERS:   If you use inhalers (even only as needed), please bring them with you on the day of your procedure.  ELIQUIS:  We will request permission from your provider about holding your Eliquis. We will call your provider's instructions once they have responded.  ABDOMINAL ULTRASOUND:  You will be contacted by [facility] in the next 2 days to arrange an [place].  The number on your caller ID will be [phone], please answer when they call.  If you have not heard from them in 2 days please call [phone] to schedule.    BMI:  If you are age 48 or younger, your body mass index should be between 19-25. Your Body mass index is 50.91 kg/m. If this is out of the  aformentioned range listed, please consider follow up with your Primary Care Provider.   Thank you for trusting me with your gastrointestinal care!    Thornton Park, MD, MPH

## 2021-01-02 NOTE — Telephone Encounter (Signed)
[facility] Pre-operative Risk Assessment     Sean [name] May 11, 1971 [id number]  Procedure: 02/19/21 Procedure Date: Colonoscopy and EGD Provider: [name] Practice: [facility]  Phone: [phone] Fax: [phone] ATTENTION: [name], LPN  Request for surgical clearance:  Endoscopy and Colonoscopy Procedure  Anesthesia type (None, local, MAC, general) ?  MAC  What type of clearance is required ?   Pharmacy  Medication(s) needing held: Eliquis   Length of time to be held? 2 days prior to procedure

## 2021-01-02 NOTE — Progress Notes (Signed)
Referring Provider: Raina Mina., MD Primary Care Physician:  Raina Mina., MD  Reason for Consultation:  Epigastric pain   IMPRESSION:  Epigastric pain Early morning nausea and vomiting Thrombocytopenia with platelets 58,000 Fatty liver on prior ultrasound 2019 Chronic eliquis use  EGD recommended to evaluate for abdominal pain, nausea, and vomiting. I have recommended holding Eliquis for 2 days before endoscopy.  I discussed with the patient that there is a low, but real, risk of a cardiovascular event such as heart attack, stroke, or embolism/thrombosis while off Eliquis. Will communicate with patient's prescribing provider to confirm that holding the Eliquis is appropriate at this time.    Suspected underlying liver disease. Labs today to monitor for hepatic synthetic dysfunction as well as calculate a FIB-4 and APRI score. Elastography recommended for staging.     PLAN: CBC, PT/INR, CMP EGD and colonoscopy after an Eliquis washout Procedures at the hospital Abdominal ultrasound with elastography  Please see the "Patient Instructions" section for addition details about the plan.  HPI: Sean [name] is a 50 y.o. male referred by his cardiologist for epigastric pain. The history is obtained through the patient, review of his electronic health record, and his aunt who accompanies him to this appointment.   He has a history of hyperlipidemia, hypertension, chronic kidney disease, obstructive sleep apnea, coronary artery disease and coronary artery bypass in 2017, NASH, reflux, and thrombocytopenia who has been under evaluation for epigastric pain.  He is on chronic Eliquis. 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   He was seen by GI at [facility] June 2021 for multiple symptoms.  (1) He reports a 35 year history of epigastric pain. Associated distension.  As a child, his family thought it was because he didn't want to go to school.   Worsens with  alcohol use.  Relieved by Carafate.   (2) Longstanding history of GERD treated the best with esomeprazole.  Did not find pantoprazole as effective. Frequently sleeps elevated in a recliner.  (3) He has early morning emesis that lasts 30 to 45 minutes.  Occasional bleed with streaks of blood.  Feels better after vomiting. Occurs mostly on days when he gets up earlier than his routine. No dysphagia, odynophagia, dysphonia, sore throat, neck pain.   (4) Also reports a lower abdominal pain for 2 months without altered bowel habits. Rare constipation and diarrhea. Bright red blood in the stool prior to Christmas. No mucous.   (5) Liver enzymes have been elevated since 2018. Thrombocytopenia present since 2015. Abdominal ultrasound 01/2018 showed fatty liver Labs 03/2020 showed elevated liver enzymes with alk phos 214, AST 41, ALT 54 and thrombocytopenia with platelets of 54.  TSH 4.193.  Normal iron profile. Labs 10/2020 showed platelets 58, hemoglobin 14.4  Rare beer with supper. No history of heavy drinking. No illicit street drugs. No tobacco, chewing tobacco, or e-cigarettes.   Maternal Aunt and maternal grandfather with peptic ulcer disease. Maternal aunt with colon polyps. No other known family history of colon cancer or polyps. No family history of uterine/endometrial cancer, pancreatic cancer or gastric/stomach cancer.   Past Medical History:  Diagnosis Date  . Abnormal liver enzymes    a. Sees a doctor in [place].  . Cardiac cirrhosis    a. possible elevated LFTs/low platelets felt due to cardiac cirrhosis per 2017 admission.  . Chronic combined systolic and diastolic CHF (congestive heart failure) ([place])   . CKD (chronic kidney disease), stage II   .  Congenital heart defect    a. rightward rotation of heart, almost dextrocardia  . Coronary artery disease    a. s/p CABGx1 in 12/2015.  Marland Kitchen Hematuria    a. Chronic hx of this, no prior etiology determined through workup per patient.  .  Hypercholesteremia    a. Prev taken off statin due to abnormal liver function.  . Hypertension   . Morbid obesity ([place])   . Paroxysmal atrial flutter ([place]) 02/12/2016  . 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 Sinus bradycardia   . Thrombocytopenia ([place])     Past Surgical History:  Procedure Laterality Date  . APPENDECTOMY    . CARDIAC CATHETERIZATION N/A 12/24/2015   Procedure: Right/Left Heart Cath and Coronary Angiography;  Surgeon: Jolaine Artist, MD;  Location: [facility];  Service: Cardiovascular;  Laterality: N/A;  . CARDIOVERSION N/A 02/14/2016   Procedure: CARDIOVERSION;  Surgeon: Pixie Casino, MD;  Location: [facility] ENDOSCOPY;  Service: Cardiovascular;  Laterality: N/A;  . CORONARY ARTERY BYPASS GRAFT N/A 12/26/2015   Procedure: Off Pump Coronary Artery Bypass Grafting times one using left internal mammary artery;  Surgeon: Rexene Alberts, MD;  Location: [facility];  Service: Open Heart Surgery;  Laterality: N/A;  . GALLBLADDER SURGERY    . HERNIA REPAIR    . LEFT HEART CATH AND CORS/GRAFTS ANGIOGRAPHY N/A 04/15/2017   Procedure: Left Heart Cath and Cors/Grafts Angiography;  Surgeon: [place] Sine, MD;  Location: [facility];  Service: Cardiovascular;  Laterality: N/A;  . LEFT HEART CATH AND CORS/GRAFTS ANGIOGRAPHY N/A 11/02/2020   Procedure: LEFT HEART CATH AND CORS/GRAFTS ANGIOGRAPHY;  Surgeon: Sherren Mocha, MD;  Location: [facility];  Service: Cardiovascular;  Laterality: N/A;  . TEE WITHOUT CARDIOVERSION N/A 12/26/2015   Procedure: TRANSESOPHAGEAL ECHOCARDIOGRAM (TEE);  Surgeon: Rexene Alberts, MD;  Location: [facility];  Service: Open Heart Surgery;  Laterality: N/A;  . TEE WITHOUT CARDIOVERSION N/A 02/14/2016   Procedure: TRANSESOPHAGEAL ECHOCARDIOGRAM (TEE);  Surgeon: Pixie Casino, MD;  Location: [facility] ENDOSCOPY;  Service: Cardiovascular;  Laterality: N/A;    Current Outpatient Medications  Medication Sig Dispense Refill  . acetaminophen  (TYLENOL) 500 MG tablet Take 500-1,500 mg by mouth at bedtime.    Marland Kitchen allopurinol (ZYLOPRIM) 100 MG tablet Take 1 tablet (100 mg total) by mouth daily. 30 tablet 1  . cetirizine (ZYRTEC) 10 MG tablet Take 10 mg by mouth daily.    . Cyanocobalamin (VITAMIN B-12) 5000 MCG SUBL Place 5,000 mcg under the tongue daily.    Marland Kitchen diltiazem (CARDIZEM CD) 120 MG 24 hr capsule TAKE ONE CAPSULE BY MOUTH DAILY (Patient taking differently: Take 120 mg by mouth daily. ) 90 capsule 1  . ELIQUIS 5 MG TABS tablet TAKE 1 TABLET BY MOUTH TWICE DAILY. (Patient taking differently: Take 5 mg by mouth 2 (two) times daily. ) 60 tablet 5  . ezetimibe (ZETIA) 10 MG tablet TAKE 1 TABLET(10 MG) BY MOUTH DAILY 90 tablet 3  . famotidine (PEPCID) 20 MG tablet Take 20 mg by mouth at bedtime.    . furosemide (LASIX) 40 MG tablet Take 40 mg by mouth daily as needed for fluid.     . metoprolol succinate (TOPROL-XL) 25 MG 24 hr tablet Take 25 mg by mouth daily.    . montelukast (SINGULAIR) 10 MG tablet Take 10 mg by mouth daily.    . nitroGLYCERIN (NITROSTAT) 0.4 MG SL tablet Place 1 tablet (0.4 mg total)  under the tongue every 5 (five) minutes as needed for chest pain. 25 tablet 12  . pantoprazole (PROTONIX) 40 MG tablet TAKE 1 TABLET EVERY MORNING. (Patient taking differently: Take 40 mg by mouth daily. ) 30 tablet 8  . potassium chloride (K-DUR,KLOR-CON) 10 MEQ tablet TAKE 1 TABLET ONCE DAILY. (Patient taking differently: Take 10 mEq by mouth daily. ) 30 tablet 8  . REPATHA SURECLICK 035 MG/ML SOAJ INJECT THE CONTENTS OF 1 SYRINGE UNDER THE SKIN EVERY 14 DAYS (Patient taking differently: Inject 140 mg into the skin every 14 (fourteen) days. ) 2 pen 11  . sucralfate (CARAFATE) 1 g tablet Take 1 g by mouth 2 (two) times daily.      No current facility-administered medications for this visit.    Allergies as of 01/02/2021 - Review Complete 11/02/2020  Allergen Reaction Noted  . Imdur [isosorbide dinitrate] Diarrhea 04/16/2017  .  Augmentin [amoxicillin-pot clavulanate] Itching 04/07/2016  . Tape Rash 04/14/2017    Family History  Problem Relation Age of Onset  . CAD Father        2 stents ~ 10  . Heart attack Other   . Hyperlipidemia Other     Social History   Socioeconomic History  . Marital status: Legally Separated    Spouse name: Not on file  . Number of children: Not on file  . Years of education: Not on file  . Highest education level: Not on file  Occupational History  . Occupation: Truck Geophysicist/field seismologist  Tobacco Use  . Smoking status: Never Smoker  . Smokeless tobacco: Never Used  Vaping Use  . Vaping Use: Never used  Substance and Sexual Activity  . Alcohol use: Yes  . Drug use: No  . Sexual activity: Not on file  Other Topics Concern  . Not on file  Social History Narrative  . Not on file   Social Determinants of Health   Financial Resource Strain: Not on file  Food Insecurity: Not on file  Transportation Needs: Not on file  Physical Activity: Not on file  Stress: Not on file  Social Connections: Not on file  Intimate Partner Violence: Not on file    Review of Systems: 12 system ROS is negative except as noted above.   Physical Exam: General:   Alert,  well-nourished, pleasant and cooperative in NAD Head:  Normocephalic and atraumatic. Eyes:  Sclera clear, no icterus.   Conjunctiva pink. Ears:  Normal auditory acuity. Nose:  No deformity, discharge,  or lesions. Mouth:  No deformity or lesions.   Neck:  Supple; no masses or thyromegaly. Lungs:  Clear throughout to auscultation.   No wheezes. Heart:  Regular rate and rhythm; no murmurs. Abdomen:  Soft, central obesity, nontender, nondistended, normal bowel sounds, no rebound or guarding. No hepatosplenomegaly.   Rectal:  Deferred  Msk:  Symmetrical. No boney deformities LAD: No inguinal or umbilical LAD Extremities:  No clubbing or edema. Neurologic:  Alert and  oriented x4;  grossly nonfocal Skin:  Intact without significant  lesions or rashes. Psych:  Alert and cooperative. Normal mood and affect.   Elgie Maziarz L. Tarri Glenn, MD, MPH 01/02/2021, 3:05 PM

## 2021-01-02 NOTE — Progress Notes (Signed)
01/04/21 - Pt chart reviewed to ensure [place] had been scheduled. See below:  Appointment Information  Name: Kai, Jacon [name] MRN: [id number]  Date: 01/15/2021 Status: Sch  Time: 8:00 AM Length: 60  Visit Type: US ABDOMEN COMPLETE Theodis Blaze [[id number]] Copay: $0.00  Provider: Yetta Glassman 1 Department: [facility]  Referring Provider: Thornton Park CSN: [id number]  Notes: arrive 7:45am/npo 6hrs//s/ wpt  Made On: 01/03/2021 10:23 AM By: Roosvelt Maser   01/02/21 - Following message sent to Rhys Martini and April Pait with Radiology Scheduling:  Milana Na   [facility]  Phone: [phone]  Fax: [phone]   Patient Name: Dartanian Knaggs  DOB: 1971-01-05  MRN #: [id number]   Imaging Ordered: Abdominal Ultrasound with Elastography   Diagnosis: Thrombocytopenia, Abdominal pain   Ordering Provider: [name]   Is a Prior Authorization needed? We are in the process of obtaining it now   Is the patient Diabetic? No   Does the patient have Hypertension? Yes   Does the patient have any implanted devices or hardware? No   Date of last BUN/Creat, if needed? Obtained today   Patient Weight? 365#'s   Is the patient able to get on the table? Yes   Has the patient been diagnosed with COVID? No   Is the patient waiting on COVID testing results? No   Please call pt to schedule him for the above mentioned imaging.   Thank you,  Aleatha Borer, LPN

## 2021-01-03 NOTE — Telephone Encounter (Signed)
That's correct.

## 2021-01-03 NOTE — Telephone Encounter (Signed)
Will route clearance recommendations back to [facility] LPN with [facility], via epic.

## 2021-01-03 NOTE — Telephone Encounter (Signed)
He can hold his Eliquis for 3 doses prior to the procedure and 1 dose afterwards.  No need for bridging.

## 2021-01-03 NOTE — Telephone Encounter (Signed)
Dr. Meda Coffee - I wanted to clarify to ensure I understood your recommendation. Since he is scheduled at 10 am and the pt takes Eliquis BID, he would not take as follows:  Hold AM dose on 3/22, PM and AM dose on 3/21 With procedure scheduled at 10am, Hold PM dose on 3/22, then resume as Rx'd  Please review and advise.  [name] - I wanted to ensure you are okay with this recommendation as well.

## 2021-01-04 NOTE — Telephone Encounter (Signed)
Called pt Cell# but VM not set up. Called home # as well. LVM requesting returned call.

## 2021-01-07 NOTE — Telephone Encounter (Signed)
Pt returned call. Provided him with Dr. Francesca Oman instructions as written below. Pt expressed acceptance and understanding of these instructions.

## 2021-01-07 NOTE — Progress Notes (Signed)
Cardiology Office Note    Date:  01/09/2021   ID:  Sean [name], DOB 1971-07-22, MRN [id number]  PCP:  Raina Mina., MD  Cardiologist: Ena Dawley, MD EPS: None  Chief Complaint  Patient presents with  . Follow-up    History of Present Illness:  Sean [name] is a 50 y.o. male with history of CAD status post CABG 2017 LIMA to the LAD, hypertension, HLD, morbid obesity.  Cath in 2018 stable CAD, history of atrial fib/flutter on Eliquis status post DCCV maintaining normal sinus rhythm.  Patient saw Dr. Meda Coffee 10/10/2020 after an ER visit for chest pain at which time troponins were negative.  Outpatient NST 10/16/2020 normal LV function moderate anterior ischemia and cardiac catheterization was recommended by Dr. Meda Coffee.  Cath 11/02/2020 showed patent LIMA to the LAD with widely patent left main, circumflex and RCA.  Medical therapy recommended.  Patient comes in for f/u accompanied by his aunt. Chronic DOE because of weight. Drives truck for a living. Denies chest pain. No regular exercise, no bleeding problems on eliquis.       Past Medical History:  Diagnosis Date  . Abnormal liver enzymes    a. Sees a doctor in [place].  . Cardiac cirrhosis    a. possible elevated LFTs/low platelets felt due to cardiac cirrhosis per 2017 admission.  . Chronic combined systolic and diastolic CHF (congestive heart failure) ([place])   . CKD (chronic kidney disease), stage II   . Congenital heart defect    a. rightward rotation of heart, almost dextrocardia  . Coronary artery disease    a. s/p CABGx1 in 12/2015.  Marland Kitchen Hematuria    a. Chronic hx of this, no prior etiology determined through workup per patient.  . Hypercholesteremia    a. Prev taken off statin due to abnormal liver function.  . Hypertension   . Morbid obesity ([place])   . Paroxysmal atrial flutter ([place]) 02/12/2016  . 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 Sinus bradycardia   . Thrombocytopenia ([place])      Past Surgical History:  Procedure Laterality Date  . APPENDECTOMY    . CARDIAC CATHETERIZATION N/A 12/24/2015   Procedure: Right/Left Heart Cath and Coronary Angiography;  Surgeon: Jolaine Artist, MD;  Location: [facility];  Service: Cardiovascular;  Laterality: N/A;  . CARDIOVERSION N/A 02/14/2016   Procedure: CARDIOVERSION;  Surgeon: Pixie Casino, MD;  Location: [facility] ENDOSCOPY;  Service: Cardiovascular;  Laterality: N/A;  . CORONARY ARTERY BYPASS GRAFT N/A 12/26/2015   Procedure: Off Pump Coronary Artery Bypass Grafting times one using left internal mammary artery;  Surgeon: Rexene Alberts, MD;  Location: [facility];  Service: Open Heart Surgery;  Laterality: N/A;  . GALLBLADDER SURGERY    . HERNIA REPAIR    . LEFT HEART CATH AND CORS/GRAFTS ANGIOGRAPHY N/A 04/15/2017   Procedure: Left Heart Cath and Cors/Grafts Angiography;  Surgeon: Troy Sine, MD;  Location: [facility];  Service: Cardiovascular;  Laterality: N/A;  . LEFT HEART CATH AND CORS/GRAFTS ANGIOGRAPHY N/A 11/02/2020   Procedure: LEFT HEART CATH AND CORS/GRAFTS ANGIOGRAPHY;  Surgeon: Sherren Mocha, MD;  Location: [facility];  Service: Cardiovascular;  Laterality: N/A;  . TEE WITHOUT CARDIOVERSION N/A 12/26/2015   Procedure: TRANSESOPHAGEAL ECHOCARDIOGRAM (TEE);  Surgeon: Rexene Alberts, MD;  Location: [facility];  Service: Open Heart Surgery;  Laterality: N/A;  . TEE WITHOUT CARDIOVERSION N/A 02/14/2016   Procedure: TRANSESOPHAGEAL ECHOCARDIOGRAM (  TEE);  Surgeon: Pixie Casino, MD;  Location: [facility] ENDOSCOPY;  Service: Cardiovascular;  Laterality: N/A;    Current Medications: Current Meds  Medication Sig  . acetaminophen (TYLENOL) 500 MG tablet Take 500-1,500 mg by mouth at bedtime.  Marland Kitchen allopurinol (ZYLOPRIM) 100 MG tablet Take 1 tablet (100 mg total) by mouth daily.  . cetirizine (ZYRTEC) 10 MG tablet Take 10 mg by mouth daily.  . Cyanocobalamin (VITAMIN B-12) 5000 MCG SUBL Place 5,000 mcg under the  tongue daily.  Marland Kitchen diltiazem (CARDIZEM CD) 120 MG 24 hr capsule TAKE ONE CAPSULE BY MOUTH DAILY (Patient taking differently: Take 120 mg by mouth daily.)  . ELIQUIS 5 MG TABS tablet TAKE 1 TABLET BY MOUTH TWICE DAILY. (Patient taking differently: Take 5 mg by mouth 2 (two) times daily.)  . ezetimibe (ZETIA) 10 MG tablet TAKE 1 TABLET(10 MG) BY MOUTH DAILY  . famotidine (PEPCID) 20 MG tablet Take 20 mg by mouth at bedtime.  . furosemide (LASIX) 40 MG tablet Take 40 mg by mouth daily as needed for fluid.  . metoprolol succinate (TOPROL-XL) 25 MG 24 hr tablet Take 25 mg by mouth daily.  . montelukast (SINGULAIR) 10 MG tablet Take 10 mg by mouth daily.  . nitroGLYCERIN (NITROSTAT) 0.4 MG SL tablet Place 1 tablet (0.4 mg total) under the tongue every 5 (five) minutes as needed for chest pain.  . pantoprazole (PROTONIX) 40 MG tablet TAKE 1 TABLET EVERY MORNING. (Patient taking differently: Take 40 mg by mouth daily.)  . potassium chloride (K-DUR,KLOR-CON) 10 MEQ tablet TAKE 1 TABLET ONCE DAILY. (Patient taking differently: Take 10 mEq by mouth daily.)  . REPATHA SURECLICK 989 MG/ML SOAJ INJECT THE CONTENTS OF 1 SYRINGE UNDER THE SKIN EVERY 14 DAYS (Patient taking differently: Inject 140 mg into the skin every 14 (fourteen) days.)  . sucralfate (CARAFATE) 1 g tablet Take 1 g by mouth 2 (two) times daily.     Allergies:   Imdur [isosorbide dinitrate], Augmentin [amoxicillin-pot clavulanate], and Tape   Social History   Socioeconomic History  . Marital status: Legally Separated    Spouse name: Not on file  . Number of children: Not on file  . Years of education: Not on file  . Highest education level: Not on file  Occupational History  . Occupation: Truck Geophysicist/field seismologist  Tobacco Use  . Smoking status: Never Smoker  . Smokeless tobacco: Never Used  Vaping Use  . Vaping Use: Never used  Substance and Sexual Activity  . Alcohol use: Yes  . Drug use: No  . Sexual activity: Not on file  Other Topics  Concern  . Not on file  Social History Narrative  . Not on file   Social Determinants of Health   Financial Resource Strain: Not on file  Food Insecurity: Not on file  Transportation Needs: Not on file  Physical Activity: Not on file  Stress: Not on file  Social Connections: Not on file     Family History:  The patient's family history includes CAD in his father; Diabetes in his father, maternal grandfather, and maternal uncle; Heart attack in an other family member; Hyperlipidemia in an other family member.   ROS:   Please see the history of present illness.    ROS All other systems reviewed and are negative.   PHYSICAL EXAM:   VS:  BP 140/84   Pulse 69   Ht 5\' 11"  (1.803 m)   Wt (!) 363 lb 12.8 oz (165 kg)   SpO2  95%   BMI 50.74 kg/m   Physical Exam  GEN: Obese, in no acute distress  Neck: no JVD, carotid bruits, or masses Cardiac:RRR; no murmurs, rubs, or gallops  Respiratory:  clear to auscultation bilaterally, normal work of breathing GI: soft, nontender, nondistended, + BS Ext: without cyanosis, clubbing, or edema, Good distal pulses bilaterally Neuro:  Alert and Oriented x 3 Psych: euthymic mood, full affect  Wt Readings from Last 3 Encounters:  01/09/21 (!) 363 lb 12.8 oz (165 kg)  01/02/21 (!) 365 lb (165.6 kg)  11/02/20 (!) 365 lb (165.6 kg)      Studies/Labs Reviewed:   EKG:  EKG is not ordered today.    Recent Labs: 08/13/2020: TSH 3.220 01/02/2021: ALT 41; BUN 21; Creatinine, Ser 1.60; Hemoglobin 14.6; Platelets 74.0; Potassium 4.2; Sodium 141   Lipid Panel    Component Value Date/Time   CHOL 107 08/13/2020 0909   TRIG 118 08/13/2020 0909   HDL 49 08/13/2020 0909   CHOLHDL 2.2 08/13/2020 0909   CHOLHDL 2.7 11/26/2016 0912   VLDL 18 11/26/2016 0912   LDLCALC 37 08/13/2020 0909   LDLDIRECT 45 10/21/2019 0942    Additional studies/ records that were reviewed today include:  Cath 11/02/2020 1.  Severe single-vessel coronary artery disease with  total occlusion of the ostial LAD 2.  Widely patent left main, left circumflex, and RCA with no significant stenoses 3.  Patent LIMA to LAD with no significant stenosis present 4.  Normal LVEDP   Recommendations: Ongoing medical therapy.  The patient has stable coronary anatomy with continued patency of the LIMA graft.  Patient may resume apixaban tomorrow morning at his normal dosing schedule.  NST 10/17/2020 Study Highlights     Nuclear stress EF: 60%.  The left ventricular ejection fraction is normal (55-65%).  There was no ST segment deviation noted during stress.  No T wave inversion was noted during stress.  This is an intermediate risk study.   1. There is a moderate to severe fixed defect of medium size present in the inferior segments, base to apex (15-20% of LV). The wall motion in this region appears normal, so this either represents prior infarction or diaphragm attenuation.  2. There is a moderate to severe, partially reversible, defect present on stress imaging that is medium in size (~10-15% of LV) in the base to mid anterior wall, into the apical septum. The defect is present on rest imaging in the apical septum and mid anterior wall. The wall motion in this region appears to be normal. This either represents prior infarct with peri-infarct ischemia or breast attenuation given the patient's size (BMI 52).  3. Normal LVEF, 60%.  4. This is an intermediate risk study and likely equivocal due to reasons stated above.     Risk Assessment/Calculations:    CHA2DS2-VASc Score = 2  This indicates a 2.2% annual risk of stroke. The patient's score is based upon: CHF History: Yes HTN History: No Diabetes History: No Stroke History: No Vascular Disease History: Yes Age Score: 0 Gender Score: 0        ASSESSMENT:    1. 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 Atrial flutter, unspecified type ([place])   3.  Chronic systolic CHF (congestive heart failure) ([place])   4. Morbid obesity ([place])   5. Hyperlipidemia, unspecified hyperlipidemia type   6. Obstructive sleep apnea syndrome      PLAN:  In order of problems listed  above:   CAD status post CABG 2017 with a LIMA to the LAD, cath in 2018 stable CAD, recent cath 11/02/2020 after abnormal NST showed patent LIMA to the LAD other vessels stable medical therapy recommended. No Chest pain. Recommend 150 min exercise weekly.   Paroxysmal atrial flutter status post DCCV 02/26/2016 maintaining normal sinus rhythm on Eliquis.  Patient wanted to come off Eliquis and Dr. Meda Coffee ordered 2-week Zio patch-never put it on but still has it so will wear it. No bleeding problems on eliquis  Chronic systolic CHF LVEF 45 to 39% on echo 2017, EF 60% on NST 10/2020 as needed Lasix-hasn't used in a long time  Morbid obesity- Weight loss discussed. Offered weight loss referral.   HLD on Repatha and Zetia with improvement of LFTs  OSA on CPAP-uses every night.  Shared Decision Making/Informed Consent        Medication Adjustments/Labs and Tests Ordered: Current medicines are reviewed at length with the patient today.  Concerns regarding medicines are outlined above.  Medication changes, Labs and Tests ordered today are listed in the Patient Instructions below. There are no Patient Instructions on file for this visit.   Sumner Boast, PA-C  01/09/2021 10:00 AM    [facility] [address], [place], [place]  [id number] Phone: [phone]; Fax: [phone]

## 2021-01-07 NOTE — Telephone Encounter (Signed)
Called pt to inform about Eliquis instructions as written below. Unable to LVM on cell #. Also called home #. Family member took message for pt to return my call. Will continue efforts to reach pt.

## 2021-01-07 NOTE — Telephone Encounter (Signed)
Thanks for the update

## 2021-01-08 DIAGNOSIS — G4733 Obstructive sleep apnea (adult) (pediatric): Secondary | ICD-10-CM | POA: Diagnosis not present

## 2021-01-09 ENCOUNTER — Ambulatory Visit: Payer: BC Managed Care – PPO | Admitting: Physician Assistant

## 2021-01-09 ENCOUNTER — Other Ambulatory Visit: Payer: Self-pay

## 2021-01-09 ENCOUNTER — Encounter: Payer: Self-pay | Admitting: Physician Assistant

## 2021-01-09 VITALS — BP 140/84 | HR 69 | Ht 71.0 in | Wt 363.8 lb

## 2021-01-09 DIAGNOSIS — I5022 Chronic systolic (congestive) heart failure: Secondary | ICD-10-CM

## 2021-01-09 DIAGNOSIS — I4892 Unspecified atrial flutter: Secondary | ICD-10-CM | POA: Diagnosis not present

## 2021-01-09 DIAGNOSIS — E785 Hyperlipidemia, unspecified: Secondary | ICD-10-CM

## 2021-01-09 DIAGNOSIS — I25812 Atherosclerosis of bypass graft of coronary artery of transplanted heart without angina pectoris: Secondary | ICD-10-CM | POA: Diagnosis not present

## 2021-01-09 DIAGNOSIS — G4733 Obstructive sleep apnea (adult) (pediatric): Secondary | ICD-10-CM

## 2021-01-09 NOTE — Patient Instructions (Signed)
Medication Instructions:  Your physician recommends that you continue on your current medications as directed. Please refer to the Current Medication list given to you today.  *If you need a refill on your cardiac medications before your next appointment, please call your pharmacy*   Lab Work: None today If you have labs (blood work) drawn today and your tests are completely normal, you will receive your results only by: Marland Kitchen MyChart Message (if you have MyChart) OR . A paper copy in the mail If you have any lab test that is abnormal or we need to change your treatment, we will call you to review the results.   Follow-Up: At [facility], you and your health needs are our priority.  As part of our continuing mission to provide you with exceptional heart care, we have created designated Provider Care Teams.  These Care Teams include your primary Cardiologist (physician) and Advanced Practice Providers (APPs -  Physician Assistants and Nurse Practitioners) who all work together to provide you with the care you need, when you need it.  Your next appointment:   6 month(s)  The format for your next appointment:   In Person  Provider:   Gwyndolyn Kaufman, MD   Other Instructions Your provider recommends that you maintain 150 minutes per week of moderate aerobic activity.   Calorie Counting for Weight Loss Calories are units of energy. Your body needs a certain number of calories from food to keep going throughout the day. When you eat or drink more calories than your body needs, your body stores the extra calories mostly as fat. When you eat or drink fewer calories than your body needs, your body burns fat to get the energy it needs. Calorie counting means keeping track of how many calories you eat and drink each day. Calorie counting can be helpful if you need to lose weight. If you eat fewer calories than your body needs, you should lose weight. Ask your health care provider what a  healthy weight is for you. For calorie counting to work, you will need to eat the right number of calories each day to lose a healthy amount of weight per week. A dietitian can help you figure out how many calories you need in a day and will suggest ways to reach your calorie goal.  A healthy amount of weight to lose each week is usually 1-2 lb (0.5-0.9 kg). This usually means that your daily calorie intake should be reduced by 500-750 calories.  Eating 1,200-1,500 calories a day can help most women lose weight.  Eating 1,500-1,800 calories a day can help most men lose weight. What do I need to know about calorie counting? Work with your health care provider or dietitian to determine how many calories you should get each day. To meet your daily calorie goal, you will need to:  Find out how many calories are in each food that you would like to eat. Try to do this before you eat.  Decide how much of the food you plan to eat.  Keep a food log. Do this by writing down what you ate and how many calories it had. To successfully lose weight, it is important to balance calorie counting with a healthy lifestyle that includes regular activity. Where do I find calorie information? The number of calories in a food can be found on a Nutrition Facts label. If a food does not have a Nutrition Facts label, try to look up the calories online or ask  your dietitian for help. Remember that calories are listed per serving. If you choose to have more than one serving of a food, you will have to multiply the calories per serving by the number of servings you plan to eat. For example, the label on a package of bread might say that a serving size is 1 slice and that there are 90 calories in a serving. If you eat 1 slice, you will have eaten 90 calories. If you eat 2 slices, you will have eaten 180 calories.   How do I keep a food log? After each time that you eat, record the following in your food log as soon as  possible:  What you ate. Be sure to include toppings, sauces, and other extras on the food.  How much you ate. This can be measured in cups, ounces, or number of items.  How many calories were in each food and drink.  The total number of calories in the food you ate. Keep your food log near you, such as in a pocket-sized notebook or on an app or website on your mobile phone. Some programs will calculate calories for you and show you how many calories you have left to meet your daily goal. What are some portion-control tips?  Know how many calories are in a serving. This will help you know how many servings you can have of a certain food.  Use a measuring cup to measure serving sizes. You could also try weighing out portions on a kitchen scale. With time, you will be able to estimate serving sizes for some foods.  Take time to put servings of different foods on your favorite plates or in your favorite bowls and cups so you know what a serving looks like.  Try not to eat straight from a food's packaging, such as from a bag or box. Eating straight from the package makes it hard to see how much you are eating and can lead to overeating. Put the amount you would like to eat in a cup or on a plate to make sure you are eating the right portion.  Use smaller plates, glasses, and bowls for smaller portions and to prevent overeating.  Try not to multitask. For example, avoid watching TV or using your computer while eating. If it is time to eat, sit down at a table and enjoy your food. This will help you recognize when you are full. It will also help you be more mindful of what and how much you are eating. What are tips for following this plan? Reading food labels  Check the calorie count compared with the serving size. The serving size may be smaller than what you are used to eating.  Check the source of the calories. Try to choose foods that are high in protein, fiber, and vitamins, and low in  saturated fat, trans fat, and sodium. Shopping  Read nutrition labels while you shop. This will help you make healthy decisions about which foods to buy.  Pay attention to nutrition labels for low-fat or fat-free foods. These foods sometimes have the same number of calories or more calories than the full-fat versions. They also often have added sugar, starch, or salt to make up for flavor that was removed with the fat.  Make a grocery list of lower-calorie foods and stick to it. Cooking  Try to cook your favorite foods in a healthier way. For example, try baking instead of frying.  Use low-fat dairy products. Meal  planning  Use more fruits and vegetables. One-half of your plate should be fruits and vegetables.  Include lean proteins, such as chicken, [place], and fish. Lifestyle Each week, aim to do one of the following:  150 minutes of moderate exercise, such as walking.  75 minutes of vigorous exercise, such as running. General information  Know how many calories are in the foods you eat most often. This will help you calculate calorie counts faster.  Find a way of tracking calories that works for you. Get creative. Try different apps or programs if writing down calories does not work for you. What foods should I eat?  Eat nutritious foods. It is better to have a nutritious, high-calorie food, such as an avocado, than a food with few nutrients, such as a bag of potato chips.  Use your calories on foods and drinks that will fill you up and will not leave you hungry soon after eating. ? Examples of foods that fill you up are nuts and nut butters, vegetables, lean proteins, and high-fiber foods such as whole grains. High-fiber foods are foods with more than 5 g of fiber per serving.  Pay attention to calories in drinks. Low-calorie drinks include water and unsweetened drinks. The items listed above may not be a complete list of foods and beverages you can eat. Contact a dietitian  for more information.   What foods should I limit? Limit foods or drinks that are not good sources of vitamins, minerals, or protein or that are high in unhealthy fats. These include:  Candy.  Other sweets.  Sodas, specialty coffee drinks, alcohol, and juice. The items listed above may not be a complete list of foods and beverages you should avoid. Contact a dietitian for more information. How do I count calories when eating out?  Pay attention to portions. Often, portions are much larger when eating out. Try these tips to keep portions smaller: ? Consider sharing a meal instead of getting your own. ? If you get your own meal, eat only half of it. Before you start eating, ask for a container and put half of your meal into it. ? When available, consider ordering smaller portions from the menu instead of full portions.  Pay attention to your food and drink choices. Knowing the way food is cooked and what is included with the meal can help you eat fewer calories. ? If calories are listed on the menu, choose the lower-calorie options. ? Choose dishes that include vegetables, fruits, whole grains, low-fat dairy products, and lean proteins. ? Choose items that are boiled, broiled, grilled, or steamed. Avoid items that are buttered, battered, fried, or served with cream sauce. Items labeled as crispy are usually fried, unless stated otherwise. ? Choose water, low-fat milk, unsweetened iced tea, or other drinks without added sugar. If you want an alcoholic beverage, choose a lower-calorie option, such as a glass of wine or light beer. ? Ask for dressings, sauces, and syrups on the side. These are usually high in calories, so you should limit the amount you eat. ? If you want a salad, choose a garden salad and ask for grilled meats. Avoid extra toppings such as bacon, cheese, or fried items. Ask for the dressing on the side, or ask for olive oil and vinegar or lemon to use as dressing.  Estimate how  many servings of a food you are given. Knowing serving sizes will help you be aware of how much food you are eating at restaurants. Where  to find more information  Centers for Disease Control and Prevention: http://www.wolf.info/  [place]. Department of Agriculture: http://www.wilson-mendoza.org/ Summary  Calorie counting means keeping track of how many calories you eat and drink each day. If you eat fewer calories than your body needs, you should lose weight.  A healthy amount of weight to lose per week is usually 1-2 lb (0.5-0.9 kg). This usually means reducing your daily calorie intake by 500-750 calories.  The number of calories in a food can be found on a Nutrition Facts label. If a food does not have a Nutrition Facts label, try to look up the calories online or ask your dietitian for help.  Use smaller plates, glasses, and bowls for smaller portions and to prevent overeating.  Use your calories on foods and drinks that will fill you up and not leave you hungry shortly after a meal. This information is not intended to replace advice given to you by your health care provider. Make sure you discuss any questions you have with your health care provider. Document Revised: 12/29/2019 Document Reviewed: 12/29/2019 Elsevier Patient Education  2021 [employer].

## 2021-01-13 ENCOUNTER — Encounter: Payer: Self-pay | Admitting: Gastroenterology

## 2021-01-15 ENCOUNTER — Ambulatory Visit (HOSPITAL_COMMUNITY)
Admission: RE | Admit: 2021-01-15 | Discharge: 2021-01-15 | Disposition: A | Discharge status: Home / Self Care | Payer: BC Managed Care – PPO | Source: Ambulatory Visit | Attending: Gastroenterology | Admitting: Gastroenterology

## 2021-01-15 ENCOUNTER — Other Ambulatory Visit: Payer: Self-pay

## 2021-01-15 DIAGNOSIS — R112 Nausea with vomiting, unspecified: Secondary | ICD-10-CM | POA: Insufficient documentation

## 2021-01-15 DIAGNOSIS — D696 Thrombocytopenia, unspecified: Secondary | ICD-10-CM | POA: Insufficient documentation

## 2021-01-15 DIAGNOSIS — R1013 Epigastric pain: Secondary | ICD-10-CM | POA: Diagnosis not present

## 2021-01-15 DIAGNOSIS — G8929 Other chronic pain: Secondary | ICD-10-CM | POA: Diagnosis not present

## 2021-01-15 DIAGNOSIS — Z7902 Long term (current) use of antithrombotics/antiplatelets: Secondary | ICD-10-CM | POA: Diagnosis not present

## 2021-01-15 DIAGNOSIS — K7689 Other specified diseases of liver: Secondary | ICD-10-CM | POA: Diagnosis not present

## 2021-01-16 ENCOUNTER — Other Ambulatory Visit: Payer: Self-pay

## 2021-01-16 DIAGNOSIS — R7989 Other specified abnormal findings of blood chemistry: Secondary | ICD-10-CM

## 2021-02-02 ENCOUNTER — Other Ambulatory Visit: Payer: Self-pay

## 2021-02-02 ENCOUNTER — Other Ambulatory Visit: Payer: Self-pay | Admitting: Gastroenterology

## 2021-02-02 ENCOUNTER — Ambulatory Visit (HOSPITAL_COMMUNITY)
Admission: RE | Admit: 2021-02-02 | Discharge: 2021-02-02 | Disposition: A | Discharge status: Home / Self Care | Payer: BC Managed Care – PPO | Source: Ambulatory Visit | Attending: Gastroenterology | Admitting: Gastroenterology

## 2021-02-02 DIAGNOSIS — R7989 Other specified abnormal findings of blood chemistry: Secondary | ICD-10-CM | POA: Insufficient documentation

## 2021-02-02 DIAGNOSIS — F40240 Claustrophobia: Secondary | ICD-10-CM | POA: Diagnosis not present

## 2021-02-02 DIAGNOSIS — R161 Splenomegaly, not elsewhere classified: Secondary | ICD-10-CM | POA: Diagnosis not present

## 2021-02-02 DIAGNOSIS — Z9049 Acquired absence of other specified parts of digestive tract: Secondary | ICD-10-CM | POA: Diagnosis not present

## 2021-02-14 ENCOUNTER — Other Ambulatory Visit: Payer: Self-pay

## 2021-02-14 ENCOUNTER — Encounter (HOSPITAL_COMMUNITY): Payer: Self-pay | Admitting: Gastroenterology

## 2021-02-16 ENCOUNTER — Other Ambulatory Visit (HOSPITAL_COMMUNITY)
Admission: RE | Admit: 2021-02-16 | Discharge: 2021-02-16 | Disposition: A | Discharge status: Home / Self Care | Payer: BC Managed Care – PPO | Source: Ambulatory Visit | Attending: Gastroenterology | Admitting: Gastroenterology

## 2021-02-16 DIAGNOSIS — Z951 Presence of aortocoronary bypass graft: Secondary | ICD-10-CM | POA: Diagnosis not present

## 2021-02-16 DIAGNOSIS — R1013 Epigastric pain: Secondary | ICD-10-CM | POA: Diagnosis not present

## 2021-02-16 DIAGNOSIS — K317 Polyp of stomach and duodenum: Secondary | ICD-10-CM | POA: Diagnosis not present

## 2021-02-16 DIAGNOSIS — I2510 Atherosclerotic heart disease of native coronary artery without angina pectoris: Secondary | ICD-10-CM | POA: Diagnosis not present

## 2021-02-16 DIAGNOSIS — Z01812 Encounter for preprocedural laboratory examination: Secondary | ICD-10-CM | POA: Insufficient documentation

## 2021-02-16 DIAGNOSIS — Q438 Other specified congenital malformations of intestine: Secondary | ICD-10-CM | POA: Diagnosis not present

## 2021-02-16 DIAGNOSIS — I129 Hypertensive chronic kidney disease with stage 1 through stage 4 chronic kidney disease, or unspecified chronic kidney disease: Secondary | ICD-10-CM | POA: Diagnosis not present

## 2021-02-16 DIAGNOSIS — Z8371 Family history of colonic polyps: Secondary | ICD-10-CM | POA: Diagnosis not present

## 2021-02-16 DIAGNOSIS — Z20822 Contact with and (suspected) exposure to covid-19: Secondary | ICD-10-CM | POA: Insufficient documentation

## 2021-02-16 DIAGNOSIS — N182 Chronic kidney disease, stage 2 (mild): Secondary | ICD-10-CM | POA: Diagnosis not present

## 2021-02-16 DIAGNOSIS — R112 Nausea with vomiting, unspecified: Secondary | ICD-10-CM | POA: Diagnosis not present

## 2021-02-16 DIAGNOSIS — Z6841 Body Mass Index (BMI) 40.0 and over, adult: Secondary | ICD-10-CM | POA: Diagnosis not present

## 2021-02-16 DIAGNOSIS — D696 Thrombocytopenia, unspecified: Secondary | ICD-10-CM | POA: Diagnosis not present

## 2021-02-16 DIAGNOSIS — Z7901 Long term (current) use of anticoagulants: Secondary | ICD-10-CM | POA: Diagnosis not present

## 2021-02-16 DIAGNOSIS — K31A0 Gastric intestinal metaplasia, unspecified: Secondary | ICD-10-CM | POA: Diagnosis not present

## 2021-02-16 DIAGNOSIS — I5042 Chronic combined systolic (congestive) and diastolic (congestive) heart failure: Secondary | ICD-10-CM | POA: Diagnosis not present

## 2021-02-16 DIAGNOSIS — I130 Hypertensive heart and chronic kidney disease with heart failure and stage 1 through stage 4 chronic kidney disease, or unspecified chronic kidney disease: Secondary | ICD-10-CM | POA: Diagnosis not present

## 2021-02-16 DIAGNOSIS — K7581 Nonalcoholic steatohepatitis (NASH): Secondary | ICD-10-CM | POA: Diagnosis not present

## 2021-02-16 DIAGNOSIS — G4733 Obstructive sleep apnea (adult) (pediatric): Secondary | ICD-10-CM | POA: Diagnosis not present

## 2021-02-16 DIAGNOSIS — Z8379 Family history of other diseases of the digestive system: Secondary | ICD-10-CM | POA: Diagnosis not present

## 2021-02-16 DIAGNOSIS — E785 Hyperlipidemia, unspecified: Secondary | ICD-10-CM | POA: Diagnosis not present

## 2021-02-16 DIAGNOSIS — K319 Disease of stomach and duodenum, unspecified: Secondary | ICD-10-CM | POA: Diagnosis not present

## 2021-02-16 DIAGNOSIS — K219 Gastro-esophageal reflux disease without esophagitis: Secondary | ICD-10-CM | POA: Diagnosis not present

## 2021-02-16 DIAGNOSIS — Z1211 Encounter for screening for malignant neoplasm of colon: Secondary | ICD-10-CM | POA: Diagnosis not present

## 2021-02-17 LAB — SARS CORONAVIRUS 2 (TAT 6-24 HRS): SARS Coronavirus 2: NEGATIVE

## 2021-02-19 ENCOUNTER — Ambulatory Visit (HOSPITAL_COMMUNITY)
Admission: RE | Admit: 2021-02-19 | Discharge: 2021-02-19 | Disposition: A | Discharge status: Home / Self Care | Payer: BC Managed Care – PPO | Attending: Gastroenterology | Admitting: Gastroenterology

## 2021-02-19 ENCOUNTER — Ambulatory Visit (HOSPITAL_COMMUNITY): Payer: BC Managed Care – PPO | Admitting: Certified Registered"

## 2021-02-19 ENCOUNTER — Encounter (HOSPITAL_COMMUNITY)
Admission: RE | Disposition: A | Discharge status: Home / Self Care | Payer: Self-pay | Source: Home / Self Care | Attending: Gastroenterology

## 2021-02-19 ENCOUNTER — Other Ambulatory Visit: Payer: Self-pay

## 2021-02-19 ENCOUNTER — Encounter (HOSPITAL_COMMUNITY): Payer: Self-pay | Admitting: Gastroenterology

## 2021-02-19 DIAGNOSIS — Z1211 Encounter for screening for malignant neoplasm of colon: Secondary | ICD-10-CM | POA: Diagnosis not present

## 2021-02-19 DIAGNOSIS — Z20822 Contact with and (suspected) exposure to covid-19: Secondary | ICD-10-CM | POA: Insufficient documentation

## 2021-02-19 DIAGNOSIS — Z951 Presence of aortocoronary bypass graft: Secondary | ICD-10-CM | POA: Insufficient documentation

## 2021-02-19 DIAGNOSIS — Q438 Other specified congenital malformations of intestine: Secondary | ICD-10-CM | POA: Insufficient documentation

## 2021-02-19 DIAGNOSIS — I2510 Atherosclerotic heart disease of native coronary artery without angina pectoris: Secondary | ICD-10-CM | POA: Insufficient documentation

## 2021-02-19 DIAGNOSIS — I5042 Chronic combined systolic (congestive) and diastolic (congestive) heart failure: Secondary | ICD-10-CM | POA: Diagnosis not present

## 2021-02-19 DIAGNOSIS — G8929 Other chronic pain: Secondary | ICD-10-CM

## 2021-02-19 DIAGNOSIS — I129 Hypertensive chronic kidney disease with stage 1 through stage 4 chronic kidney disease, or unspecified chronic kidney disease: Secondary | ICD-10-CM | POA: Insufficient documentation

## 2021-02-19 DIAGNOSIS — I130 Hypertensive heart and chronic kidney disease with heart failure and stage 1 through stage 4 chronic kidney disease, or unspecified chronic kidney disease: Secondary | ICD-10-CM | POA: Insufficient documentation

## 2021-02-19 DIAGNOSIS — Z8349 Family history of other endocrine, nutritional and metabolic diseases: Secondary | ICD-10-CM | POA: Insufficient documentation

## 2021-02-19 DIAGNOSIS — K7581 Nonalcoholic steatohepatitis (NASH): Secondary | ICD-10-CM | POA: Insufficient documentation

## 2021-02-19 DIAGNOSIS — Z833 Family history of diabetes mellitus: Secondary | ICD-10-CM | POA: Insufficient documentation

## 2021-02-19 DIAGNOSIS — K319 Disease of stomach and duodenum, unspecified: Secondary | ICD-10-CM | POA: Insufficient documentation

## 2021-02-19 DIAGNOSIS — R1013 Epigastric pain: Secondary | ICD-10-CM | POA: Diagnosis not present

## 2021-02-19 DIAGNOSIS — K219 Gastro-esophageal reflux disease without esophagitis: Secondary | ICD-10-CM | POA: Insufficient documentation

## 2021-02-19 DIAGNOSIS — Z8379 Family history of other diseases of the digestive system: Secondary | ICD-10-CM | POA: Insufficient documentation

## 2021-02-19 DIAGNOSIS — R112 Nausea with vomiting, unspecified: Secondary | ICD-10-CM | POA: Diagnosis not present

## 2021-02-19 DIAGNOSIS — K2950 Unspecified chronic gastritis without bleeding: Secondary | ICD-10-CM | POA: Diagnosis not present

## 2021-02-19 DIAGNOSIS — K635 Polyp of colon: Secondary | ICD-10-CM

## 2021-02-19 DIAGNOSIS — Z6841 Body Mass Index (BMI) 40.0 and over, adult: Secondary | ICD-10-CM | POA: Insufficient documentation

## 2021-02-19 DIAGNOSIS — K317 Polyp of stomach and duodenum: Secondary | ICD-10-CM | POA: Diagnosis not present

## 2021-02-19 DIAGNOSIS — Z8371 Family history of colonic polyps: Secondary | ICD-10-CM | POA: Insufficient documentation

## 2021-02-19 DIAGNOSIS — G4733 Obstructive sleep apnea (adult) (pediatric): Secondary | ICD-10-CM | POA: Diagnosis not present

## 2021-02-19 DIAGNOSIS — Z7902 Long term (current) use of antithrombotics/antiplatelets: Secondary | ICD-10-CM

## 2021-02-19 DIAGNOSIS — N182 Chronic kidney disease, stage 2 (mild): Secondary | ICD-10-CM | POA: Diagnosis not present

## 2021-02-19 DIAGNOSIS — E785 Hyperlipidemia, unspecified: Secondary | ICD-10-CM | POA: Insufficient documentation

## 2021-02-19 DIAGNOSIS — Z7901 Long term (current) use of anticoagulants: Secondary | ICD-10-CM | POA: Insufficient documentation

## 2021-02-19 DIAGNOSIS — K3189 Other diseases of stomach and duodenum: Secondary | ICD-10-CM | POA: Diagnosis not present

## 2021-02-19 DIAGNOSIS — Z888 Allergy status to other drugs, medicaments and biological substances status: Secondary | ICD-10-CM | POA: Insufficient documentation

## 2021-02-19 DIAGNOSIS — D696 Thrombocytopenia, unspecified: Secondary | ICD-10-CM | POA: Insufficient documentation

## 2021-02-19 DIAGNOSIS — K31A0 Gastric intestinal metaplasia, unspecified: Secondary | ICD-10-CM | POA: Insufficient documentation

## 2021-02-19 DIAGNOSIS — Z8249 Family history of ischemic heart disease and other diseases of the circulatory system: Secondary | ICD-10-CM | POA: Insufficient documentation

## 2021-02-19 DIAGNOSIS — Z881 Allergy status to other antibiotic agents status: Secondary | ICD-10-CM | POA: Insufficient documentation

## 2021-02-19 HISTORY — PX: BIOPSY: SHX5522

## 2021-02-19 HISTORY — PX: ESOPHAGOGASTRODUODENOSCOPY (EGD) WITH PROPOFOL: SHX5813

## 2021-02-19 HISTORY — DX: Obstructive sleep apnea (adult) (pediatric): G47.33

## 2021-02-19 HISTORY — PX: COLONOSCOPY WITH PROPOFOL: SHX5780

## 2021-02-19 HISTORY — PX: POLYPECTOMY: SHX5525

## 2021-02-19 SURGERY — COLONOSCOPY WITH PROPOFOL
Anesthesia: Monitor Anesthesia Care

## 2021-02-19 MED ORDER — PROPOFOL 1000 MG/100ML IV EMUL
INTRAVENOUS | Status: AC
  Filled 2021-02-19: qty 100

## 2021-02-19 MED ORDER — SODIUM CHLORIDE 0.9 % IV SOLN: INTRAVENOUS | Status: DC

## 2021-02-19 MED ORDER — PROPOFOL 500 MG/50ML IV EMUL
INTRAVENOUS | Status: AC
  Filled 2021-02-19: qty 50

## 2021-02-19 MED ORDER — LIDOCAINE VISCOUS HCL 2 % MT SOLN
OROMUCOSAL | Status: AC
  Filled 2021-02-19: qty 15

## 2021-02-19 MED ORDER — LACTATED RINGERS IV SOLN: INTRAVENOUS | Status: DC

## 2021-02-19 MED ORDER — DEXMEDETOMIDINE (PRECEDEX) IN NS 20 MCG/5ML (4 MCG/ML) IV SYRINGE
PREFILLED_SYRINGE | INTRAVENOUS | Status: AC
  Filled 2021-02-19: qty 5

## 2021-02-19 MED ORDER — PROPOFOL 500 MG/50ML IV EMUL
INTRAVENOUS | Status: DC | PRN
  Administered 2021-02-19: 100 ug/kg/min via INTRAVENOUS

## 2021-02-19 MED ORDER — PROPOFOL 10 MG/ML IV BOLUS
INTRAVENOUS | Status: DC | PRN
  Administered 2021-02-19: 30 mg via INTRAVENOUS

## 2021-02-19 MED ORDER — GLYCOPYRROLATE 0.2 MG/ML IJ SOLN
INTRAMUSCULAR | Status: DC | PRN
  Administered 2021-02-19 (×2): .1 mg via INTRAVENOUS

## 2021-02-19 MED ORDER — LIDOCAINE HCL URETHRAL/MUCOSAL 2 % EX GEL
CUTANEOUS | Status: DC | PRN
  Administered 2021-02-19: 1 via TOPICAL

## 2021-02-19 SURGICAL SUPPLY — 25 items

## 2021-02-19 NOTE — Op Note (Signed)
[facility] Patient Name: Sean [name] Procedure Date: 02/19/2021 MRN: [id number] Attending MD: Thornton Park MD, MD Date of Birth: 04-07-1971 CSN: [id number] Age: 50 Admit Type: Outpatient Procedure:                Upper GI endoscopy Indications:              Epigastric abdominal pain, Nausea with vomiting Providers:                Thornton Park MD, MD, Particia Nearing, RN, Ladona Ridgel, Technician Referring MD:              Medicines:                Monitored Anesthesia Care Complications:            No immediate complications. Estimated blood loss:                            Minimal. Estimated Blood Loss:     Estimated blood loss was minimal. Procedure:                Pre-Anesthesia Assessment:                           - Prior to the procedure, a History and Physical                            was performed, and patient medications and                            allergies were reviewed. The patient's tolerance of                            previous anesthesia was also reviewed. The risks                            and benefits of the procedure and the sedation                            options and risks were discussed with the patient.                            All questions were answered, and informed consent                            was obtained. Prior Anticoagulants: The patient has                            taken Eliquis (apixaban), last dose was 2 days                            prior to procedure. ASA Grade Assessment: III - A  patient with severe systemic disease. After                            reviewing the risks and benefits, the patient was                            deemed in satisfactory condition to undergo the                            procedure.                           After obtaining informed consent, the endoscope was                            passed under direct vision.  Throughout the                            procedure, the patient's blood pressure, pulse, and                            oxygen saturations were monitored continuously. The                            [id number]-H190 ([id number]) Olympus gastroscope was                            introduced through the mouth, and advanced to the                            third part of duodenum. The upper GI endoscopy was                            accomplished without difficulty. The patient                            tolerated the procedure well. Scope In: Scope Out: Findings:      The examined esophagus was normal. Biopsies were obtained from the       proximal and distal esophagus with cold forceps for histology.      A single localized small erosion with no bleeding and no stigmata of       recent bleeding was found in the gastric antrum. Biopsies were taken       from the antrum, body, and fundus with a cold forceps for histology.       Estimated blood loss was minimal.      A single sessile polyp with no bleeding and stigmata of recent bleeding       was found in the gastric body. Biopsies were taken with a cold forceps       for histology. Estimated blood loss was minimal.      Patchy mildly erythematous mucosa without active bleeding and with no       stigmata of bleeding was found in the duodenal bulb. Biopsies were taken       with a cold forceps for histology. Estimated blood loss was minimal.  Impression:               - Normal esophagus. Biopsied.                           - Erosive gastropathy with no bleeding and no                            stigmata of recent bleeding. Biopsied.                           - One gastric polyp. Biopsied.                           - Erythematous duodenopathy. Biopsied. Moderate Sedation:      Not Applicable - Patient had care per Anesthesia. Recommendation:           - Discharge patient to home.                           - Resume previous diet.                            - Continue present medications.                           - Avoid all NSAIDs.                           - Await pathology results.                           - Proceed with colonoscopy today as planned. Procedure Code(s):        --- Professional ---                           [phone], Esophagogastroduodenoscopy, flexible,                            transoral; with biopsy, single or multiple Diagnosis Code(s):        --- Professional ---                           K31.89, Other diseases of stomach and duodenum                           K31.7, Polyp of stomach and duodenum                           R10.13, Epigastric pain                           R11.2, Nausea with vomiting, unspecified CPT copyright 2019 American Medical Association. All rights reserved. The codes documented in this report are preliminary and upon coder review may  be revised to meet current compliance requirements. Thornton Park MD, MD 02/19/2021 10:52:55 AM This report has been signed electronically. Number of Addenda: 0

## 2021-02-19 NOTE — H&P (Signed)
Referring Provider: No ref. provider found Primary Care Physician:  Raina Mina., MD  Reason for Consultation:  Epigastric pain   IMPRESSION:  Epigastric pain Early morning nausea and vomiting Thrombocytopenia with platelets 58,000 Fatty liver on prior ultrasound 2019 Chronic eliquis use  EGD recommended to evaluate for abdominal pain, nausea, and vomiting. I have recommended holding Eliquis for 2 days before endoscopy.  I discussed with the patient that there is a low, but real, risk of a cardiovascular event such as heart attack, stroke, or embolism/thrombosis while off Eliquis. Will communicate with patient's prescribing provider to confirm that holding the Eliquis is appropriate at this time.      PLAN: EGD and colonoscopy after an Eliquis washout  HPI: Sean [name] is a 50 y.o. male referred by his cardiologist for epigastric pain. The history is obtained through the patient, review of his electronic health record, and his aunt who accompanies him to this appointment.   He has a history of hyperlipidemia, hypertension, chronic kidney disease, obstructive sleep apnea, coronary artery disease and coronary artery bypass in 2017, NASH, reflux, and thrombocytopenia who has been under evaluation for epigastric pain.  He is on chronic Eliquis. 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   He was seen by GI at [facility] June 2021 for multiple symptoms.  (1) He reports a 35 year history of epigastric pain. Associated distension.  As a child, his family thought it was because he didn't want to go to school.   Worsens with alcohol use.  Relieved by Carafate.   (2) Longstanding history of GERD treated the best with esomeprazole.  Did not find pantoprazole as effective. Frequently sleeps elevated in a recliner.  (3) He has early morning emesis that lasts 30 to 45 minutes.  Occasional bleed with streaks of blood.  Feels better after vomiting. Occurs mostly on days  when he gets up earlier than his routine. No dysphagia, odynophagia, dysphonia, sore throat, neck pain.   (4) Also reports a lower abdominal pain for 2 months without altered bowel habits. Rare constipation and diarrhea. Bright red blood in the stool prior to Christmas. No mucous.   (5) Liver enzymes have been elevated since 2018. Thrombocytopenia present since 2015. Abdominal ultrasound 01/2018 showed fatty liver Labs 03/2020 showed elevated liver enzymes with alk phos 214, AST 41, ALT 54 and thrombocytopenia with platelets of 54.  TSH 4.193.  Normal iron profile. Labs 10/2020 showed platelets 58, hemoglobin 14.4  Rare beer with supper. No history of heavy drinking. No illicit street drugs. No tobacco, chewing tobacco, or e-cigarettes.   Maternal Aunt and maternal grandfather with peptic ulcer disease. Maternal aunt with colon polyps. No other known family history of colon cancer or polyps. No family history of uterine/endometrial cancer, pancreatic cancer or gastric/stomach cancer.   Past Medical History:  Diagnosis Date  . Abnormal liver enzymes    a. Sees a doctor in [place].  . Cardiac cirrhosis    a. possible elevated LFTs/low platelets felt due to cardiac cirrhosis per 2017 admission.  . Chronic combined systolic and diastolic CHF (congestive heart failure) ([place])   . CKD (chronic kidney disease), stage II   . Congenital heart defect    a. rightward rotation of heart, almost dextrocardia  . Coronary artery disease    a. s/p CABGx1 in 12/2015.  Marland Kitchen Hematuria    a. Chronic hx of this, no prior etiology determined through workup per patient.  . Hypercholesteremia  a. Prev taken off statin due to abnormal liver function.  . Hypertension   . Morbid obesity ([place])   . OSA on CPAP   . Paroxysmal atrial flutter ([place]) 02/12/2016  . 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 Sinus bradycardia   . Thrombocytopenia ([place])     Past Surgical History:  Procedure Laterality Date  .  APPENDECTOMY    . CARDIAC CATHETERIZATION N/A 12/24/2015   Procedure: Right/Left Heart Cath and Coronary Angiography;  Surgeon: Jolaine Artist, MD;  Location: [place] [facility];  Service: Cardiovascular;  Laterality: N/A;  . CARDIOVERSION N/A 02/14/2016   Procedure: CARDIOVERSION;  Surgeon: Pixie Casino, MD;  Location: [facility] ENDOSCOPY;  Service: Cardiovascular;  Laterality: N/A;  . CORONARY ARTERY BYPASS GRAFT N/A 12/26/2015   Procedure: Off Pump Coronary Artery Bypass Grafting times one using left internal mammary artery;  Surgeon: Rexene Alberts, MD;  Location: [place];  Service: Open Heart Surgery;  Laterality: N/A;  . GALLBLADDER SURGERY    . HERNIA REPAIR    . LEFT HEART CATH AND CORS/GRAFTS ANGIOGRAPHY N/A 04/15/2017   Procedure: Left Heart Cath and Cors/Grafts Angiography;  Surgeon: Troy Sine, MD;  Location: [place] [facility];  Service: Cardiovascular;  Laterality: N/A;  . LEFT HEART CATH AND CORS/GRAFTS ANGIOGRAPHY N/A 11/02/2020   Procedure: LEFT HEART CATH AND CORS/GRAFTS ANGIOGRAPHY;  Surgeon: Sherren Mocha, MD;  Location: [place] [facility];  Service: Cardiovascular;  Laterality: N/A;  . TEE WITHOUT CARDIOVERSION N/A 12/26/2015   Procedure: TRANSESOPHAGEAL ECHOCARDIOGRAM (TEE);  Surgeon: Rexene Alberts, MD;  Location: [place];  Service: Open Heart Surgery;  Laterality: N/A;  . TEE WITHOUT CARDIOVERSION N/A 02/14/2016   Procedure: TRANSESOPHAGEAL ECHOCARDIOGRAM (TEE);  Surgeon: Pixie Casino, MD;  Location: [facility] ENDOSCOPY;  Service: Cardiovascular;  Laterality: N/A;    Current Facility-Administered Medications  Medication Dose Route Frequency Provider Last Rate Last Admin  . 0.9 %  sodium chloride infusion   Intravenous Continuous Thornton Park, MD        Allergies as of 01/02/2021 - Review Complete 01/02/2021  Allergen Reaction Noted  . Imdur [isosorbide dinitrate] Diarrhea 04/16/2017  . Augmentin [amoxicillin-pot clavulanate] Itching 04/07/2016  . Tape Rash  04/14/2017    Family History  Problem Relation Age of Onset  . CAD Father        2 stents ~ 23  . Diabetes Father   . Heart attack Other   . Hyperlipidemia Other   . Diabetes Maternal Grandfather   . Diabetes Maternal Uncle       Physical Exam: General:   Alert,  well-nourished, pleasant and cooperative in NAD Head:  Normocephalic and atraumatic. Eyes:  Sclera clear, no icterus.   Conjunctiva pink. Ears:  Normal auditory acuity. Nose:  No deformity, discharge,  or lesions. Mouth:  No deformity or lesions.   Neck:  Supple; no masses or thyromegaly. Lungs:  Clear throughout to auscultation.   No wheezes. Heart:  Regular rate and rhythm; no murmurs. Abdomen:  Soft, central obesity, nontender, nondistended, normal bowel sounds, no rebound or guarding. No hepatosplenomegaly.   Rectal:  Deferred  Msk:  Symmetrical. No boney deformities LAD: No inguinal or umbilical LAD Extremities:  No clubbing or edema. Neurologic:  Alert and  oriented x4;  grossly nonfocal Skin:  Intact without significant lesions or rashes. Psych:  Alert and cooperative. Normal mood and affect.   Sean [name] L. Tarri Glenn, MD, MPH 02/19/2021, 9:13 AM

## 2021-02-19 NOTE — Anesthesia Postprocedure Evaluation (Signed)
Anesthesia Post Note  Patient: Sean [name]  Procedure(s) Performed: COLONOSCOPY WITH PROPOFOL (N/A ) ESOPHAGOGASTRODUODENOSCOPY (EGD) WITH PROPOFOL (N/A ) BIOPSY POLYPECTOMY     Patient location during evaluation: PACU Anesthesia Type: MAC Level of consciousness: awake and alert and oriented Pain management: pain level controlled Vital Signs Assessment: post-procedure vital signs reviewed and stable Respiratory status: spontaneous breathing, nonlabored ventilation and respiratory function stable Cardiovascular status: blood pressure returned to baseline Postop Assessment: no apparent nausea or vomiting Anesthetic complications: no   No complications documented.  Last Vitals:  Vitals:   02/19/21 1110 02/19/21 1120  BP: (!) 166/102 (!) 172/81  Pulse: 60 (!) 55  Resp: 14 17  Temp:    SpO2: 100% 100%    Last Pain:  Vitals:   02/19/21 1120  TempSrc:   PainSc: [place]

## 2021-02-19 NOTE — Op Note (Signed)
[facility] Patient Name: Sean [name] Procedure Date: 02/19/2021 MRN: [id number] Attending MD: Thornton Park MD, MD Date of Birth: 08-Feb-1971 CSN: [id number] Age: 50 Admit Type: Outpatient Procedure:                Colonoscopy Indications:              Screening for colorectal malignant neoplasm                           Incidentally noted Providers:                Thornton Park MD, MD, Particia Nearing, RN, Ladona Ridgel, Technician Referring MD:              Medicines:                Monitored Anesthesia Care Complications:            No immediate complications. Estimated blood loss:                            Minimal. Estimated Blood Loss:     Estimated blood loss was minimal. Procedure:                Pre-Anesthesia Assessment:                           - Prior to the procedure, a History and Physical                            was performed, and patient medications and                            allergies were reviewed. The patient's tolerance of                            previous anesthesia was also reviewed. The risks                            and benefits of the procedure and the sedation                            options and risks were discussed with the patient.                            All questions were answered, and informed consent                            was obtained. Prior Anticoagulants: The patient has                            taken Eliquis (apixaban), last dose was 2 days                            prior to procedure. ASA Grade Assessment: III -  A                            patient with severe systemic disease. After                            reviewing the risks and benefits, the patient was                            deemed in satisfactory condition to undergo the                            procedure.                           After obtaining informed consent, the colonoscope                             was passed under direct vision. Throughout the                            procedure, the patient's blood pressure, pulse, and                            oxygen saturations were monitored continuously. The                            CF-HQ190L ([id number]) Olympus colonoscope was                            introduced through the anus with the intention of                            advancing to the cecum. The scope was advanced to                            the ascending colon before the procedure was                            aborted. Medications were given. The colonoscopy                            was technically difficult and complex due to a                            redundant colon, significant looping and a tortuous                            colon. Successful completion of the procedure was                            aided by changing the patient's position,                            withdrawing and  reinserting the scope and applying                            abdominal pressure. However, despite these efforts,                            I was unable to advance beyond the distal ascending                            colon. There was not enough scope. The patient                            tolerated the procedure well. The quality of the                            bowel preparation was excellent. The rectum was                            photographed. Scope In: 10:19:50 AM Scope Out: 10:39:29 AM Total Procedure Duration: 0 hours 19 minutes 39 seconds  Findings:      The perianal and digital rectal examinations were normal.      A 3 mm polyp was found in the transverse colon. The polyp was sessile.       The polyp was removed with a cold snare. Resection and retrieval were       complete. Estimated blood loss was minimal.      The proximal colon and cecum were not evaluated. The exam was otherwise       without abnormality on direct and retroflexion views. Impression:               -  One 3 mm polyp in the transverse colon, removed                            with a cold snare. Resected and retrieved.                           - The examination was otherwise normal on direct                            and retroflexion views. Moderate Sedation:      Not Applicable - Patient had care per Anesthesia. Recommendation:           - Patient has a contact number available for                            emergencies. The signs and symptoms of potential                            delayed complications were discussed with the                            patient. Return to normal activities tomorrow.  Written discharge instructions were provided to the                            patient.                           - Resume previous diet.                           - Continue present medications.                           - Resume Eliquis today.                           - Await pathology results.                           - Short interval colonoscopy depending on pathology                            results.                           - Consider CT colonography given the incompletely                            evaluated right colon. Procedure Code(s):        --- Professional ---                           [id number], 52, Colonoscopy, flexible; with removal of                            tumor(s), polyp(s), or other lesion(s) by snare                            technique Diagnosis Code(s):        --- Professional ---                           Z12.11, Encounter for screening for malignant                            neoplasm of colon                           K63.5, Polyp of colon CPT copyright 2019 American Medical Association. All rights reserved. The codes documented in this report are preliminary and upon coder review may  be revised to meet current compliance requirements. Thornton Park MD, MD 02/19/2021 11:03:23 AM This report has been signed  electronically. Number of Addenda: 0

## 2021-02-19 NOTE — Transfer of Care (Signed)
Immediate Anesthesia Transfer of Care Note  Patient: Sean [name]  Procedure(s) Performed: COLONOSCOPY WITH PROPOFOL (N/A ) ESOPHAGOGASTRODUODENOSCOPY (EGD) WITH PROPOFOL (N/A ) BIOPSY POLYPECTOMY  Patient Location: PACU  Anesthesia Type:MAC  Level of Consciousness: awake, alert  and oriented  Airway & Oxygen Therapy: Patient Spontanous Breathing and Patient connected to face mask oxygen  Post-op Assessment: Report given to RN and Post -op Vital signs reviewed and stable  Post vital signs: Reviewed and stable  Last Vitals:  Vitals Value Taken Time  BP 145/74 02/19/21 1054  Temp    Pulse 82 02/19/21 1054  Resp 17 02/19/21 1054  SpO2 100 % 02/19/21 1054  Vitals shown include unvalidated device data.  Last Pain:  Vitals:   02/19/21 0904  TempSrc: Axillary  PainSc: 0-No pain         Complications: No complications documented.

## 2021-02-19 NOTE — Discharge Instructions (Signed)
YOU HAD AN ENDOSCOPIC PROCEDURE TODAY: Refer to the procedure report and other information in the discharge instructions given to you for any specific questions about what was found during the examination. If this information does not answer your questions, please call [facility] office at [phone] to clarify.  ° °YOU SHOULD EXPECT: Some feelings of bloating in the abdomen. Passage of more gas than usual. Walking can help get rid of the air that was put into your GI tract during the procedure and reduce the bloating. If you had a lower endoscopy (such as a colonoscopy or flexible sigmoidoscopy) you may notice spotting of blood in your stool or on the toilet paper. Some abdominal soreness may be present for a day or two, also. ° °DIET: Your first meal following the procedure should be a light meal and then it is ok to progress to your normal diet. A half-sandwich or bowl of soup is an example of a good first meal. Heavy or fried foods are harder to digest and may make you feel nauseous or bloated. Drink plenty of fluids but you should avoid alcoholic beverages for 24 hours. If you had a esophageal dilation, please see attached instructions for diet.   ° °ACTIVITY: Your care partner should take you home directly after the procedure. You should plan to take it easy, moving slowly for the rest of the day. You can resume normal activity the day after the procedure however YOU SHOULD NOT DRIVE, use power tools, machinery or perform tasks that involve climbing or major physical exertion for 24 hours (because of the sedation medicines used during the test).  ° °SYMPTOMS TO REPORT IMMEDIATELY: °A gastroenterologist can be reached at any hour. Please call [phone]  for any of the following symptoms:  °Following lower endoscopy (colonoscopy, flexible sigmoidoscopy) °Excessive amounts of blood in the stool  °Significant tenderness, worsening of abdominal pains  °Swelling of the abdomen that is new, acute  °Fever of 100° or  higher  °Following upper endoscopy (EGD, EUS, ERCP, esophageal dilation) °Vomiting of blood or coffee ground material  °New, significant abdominal pain  °New, significant chest pain or pain under the shoulder blades  °Painful or persistently difficult swallowing  °New shortness of breath  °Black, tarry-looking or red, bloody stools ° °FOLLOW UP:  °If any biopsies were taken you will be contacted by phone or by letter within the next 1-3 weeks. Call [phone]  if you have not heard about the biopsies in 3 weeks.  °Please also call with any specific questions about appointments or follow up tests. ° °

## 2021-02-19 NOTE — Anesthesia Preprocedure Evaluation (Addendum)
Anesthesia Evaluation  Patient identified by MRN, date of birth, ID band Patient awake    Reviewed: Allergy & Precautions, NPO status , Patient's Chart, lab work & pertinent test results, reviewed documented beta blocker date and time   History of Anesthesia Complications Negative for: history of anesthetic complications  Airway Mallampati: II  TM Distance: >3 FB Neck ROM: Full    Dental no notable dental hx.    Pulmonary sleep apnea and Continuous Positive Airway Pressure Ventilation ,    Pulmonary exam normal        Cardiovascular hypertension, Pt. on home beta blockers and Pt. on medications + CAD, + CABG (2017) and +CHF  Normal cardiovascular exam+ dysrhythmias (on Eliquis) Atrial Fibrillation      Neuro/Psych negative neurological ROS  negative psych ROS   GI/Hepatic Neg liver ROS, GERD  Medicated and Controlled,  Endo/Other  Morbid obesity (BMI 51)  Renal/GU Renal InsufficiencyRenal disease  negative genitourinary   Musculoskeletal  (+) Arthritis ,   Abdominal   Peds  Hematology negative hematology ROS (+)   Anesthesia Other Findings Day of surgery medications reviewed with patient.  Reproductive/Obstetrics negative OB ROS                            Anesthesia Physical Anesthesia Plan  ASA: IV  Anesthesia Plan: MAC   Post-op Pain Management:    Induction:   PONV Risk Score and Plan: Treatment may vary due to age or medical condition and Propofol infusion  Airway Management Planned: Natural Airway and Nasal Cannula  Additional Equipment: None  Intra-op Plan:   Post-operative Plan:   Informed Consent: I have reviewed the patients History and Physical, chart, labs and discussed the procedure including the risks, benefits and alternatives for the proposed anesthesia with the patient or authorized representative who has indicated his/her understanding and acceptance.        Plan Discussed with: CRNA  Anesthesia Plan Comments:        Anesthesia Quick Evaluation

## 2021-02-20 ENCOUNTER — Encounter (HOSPITAL_COMMUNITY): Payer: Self-pay | Admitting: Gastroenterology

## 2021-02-20 LAB — SURGICAL PATHOLOGY

## 2021-03-05 DIAGNOSIS — J0100 Acute maxillary sinusitis, unspecified: Secondary | ICD-10-CM | POA: Diagnosis not present

## 2021-03-08 IMAGING — US US ABDOMEN LIMITED W/ ELASTOGRAPHY
2 series · 12 of 25 positions shown · non-contrast
Comparison: Abdominal ultrasound June 08, 2020.

CLINICAL DATA: Abdominal pain, nausea vomiting, increased LFTs
since 4526. Cholecystectomy in 0888

EXAM:
US ABDOMEN LIMITED - RIGHT UPPER QUADRANT
ULTRASOUND HEPATIC ELASTOGRAPHY
TECHNIQUE: Sonography of the right upper quadrant was performed. In addition,
ultrasound elastography evaluation of the liver was performed. A
region of interest was placed within the right lobe of the liver.
Following application of a compressive sonographic pulse, tissue
compressibility was assessed. Multiple assessments were performed at
the selected site. Median tissue compressibility was determined.
Previously, hepatic stiffness was assessed by shear wave velocity.
Based on recently published Society of Radiologists in Ultrasound
consensus article, reporting is now recommended to be performed in
the SI units of pressure (kiloPascals) representing hepatic
stiffness/elasticity. The obtained result is compared to the
published reference standards. (cACLD = compensated Advanced Chronic
Liver Disease)

[Series 1: us abdomen limited w/ elastography · 6 of 23 slices shown (1 of 2)]
[im 3/23]
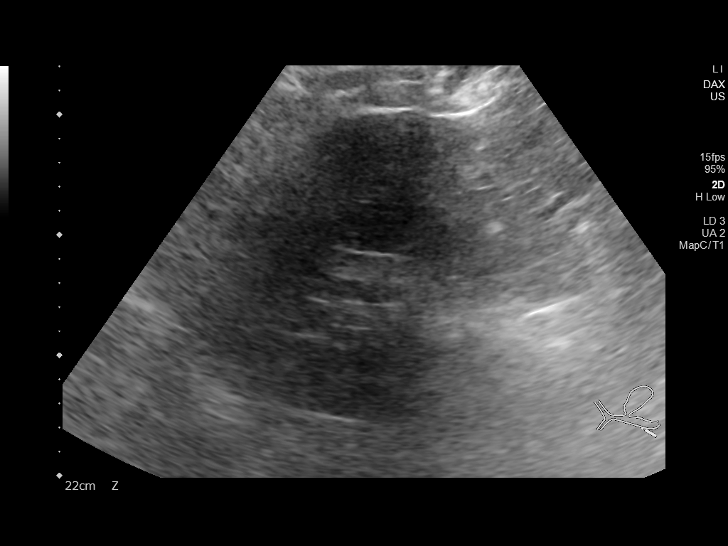
[im 7/23]
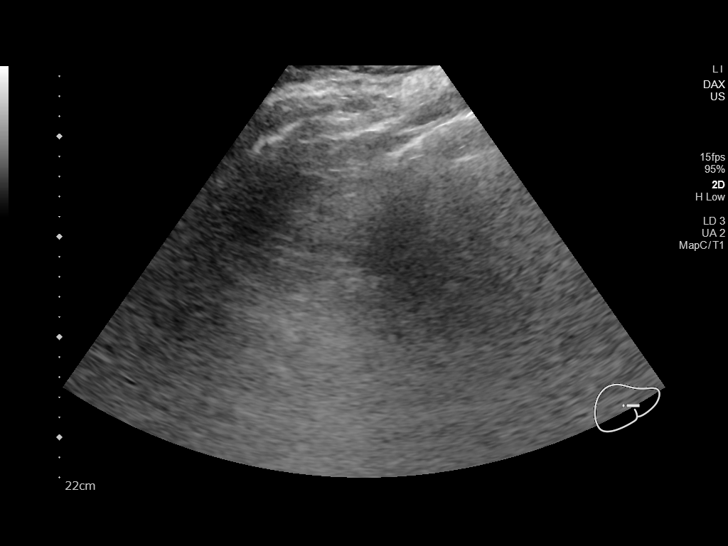
[im 11/23]
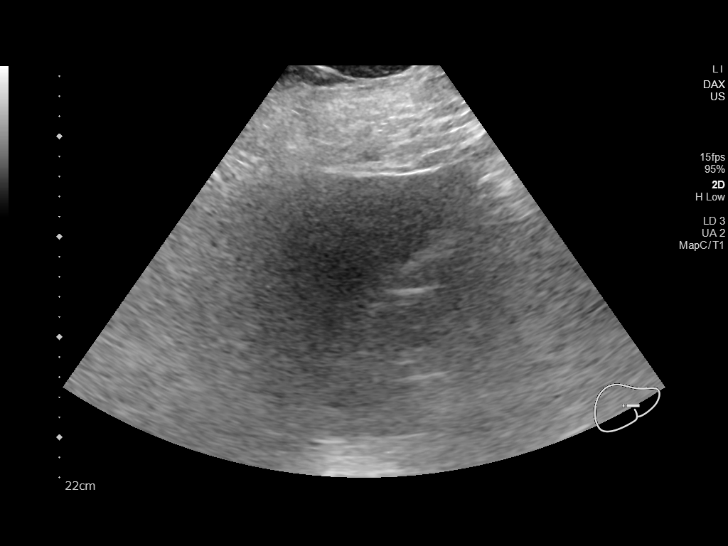
[im 15/23]
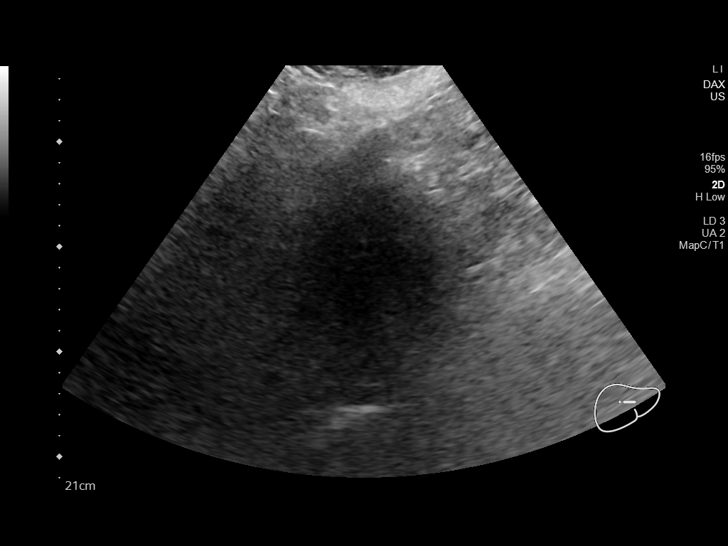
[im 19/23]
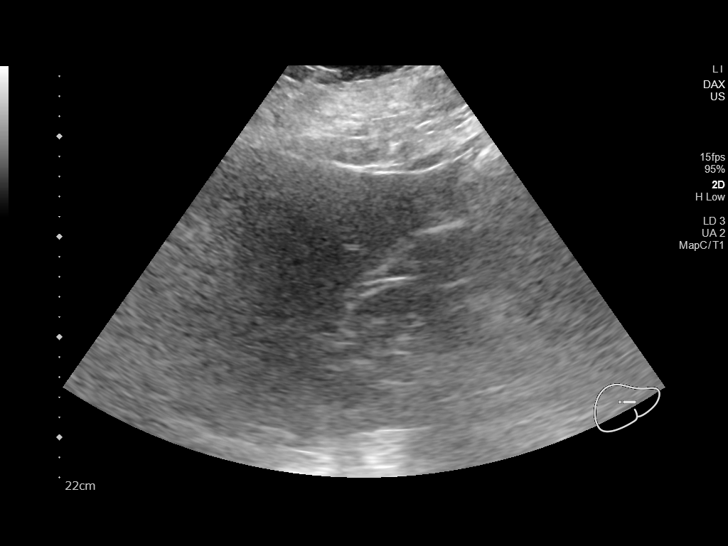
[im 23/23]
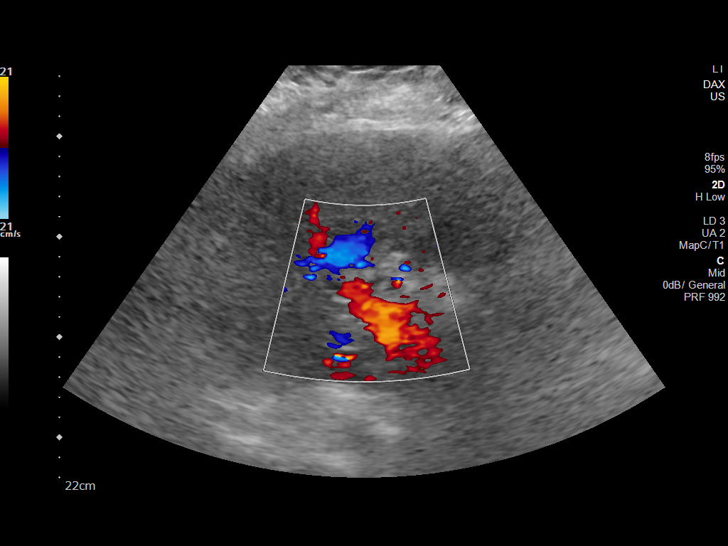

[Series 1001: us abdomen limited w/ elastography · 6 of 25 slices shown (2 of 2)]
[im 3/25]
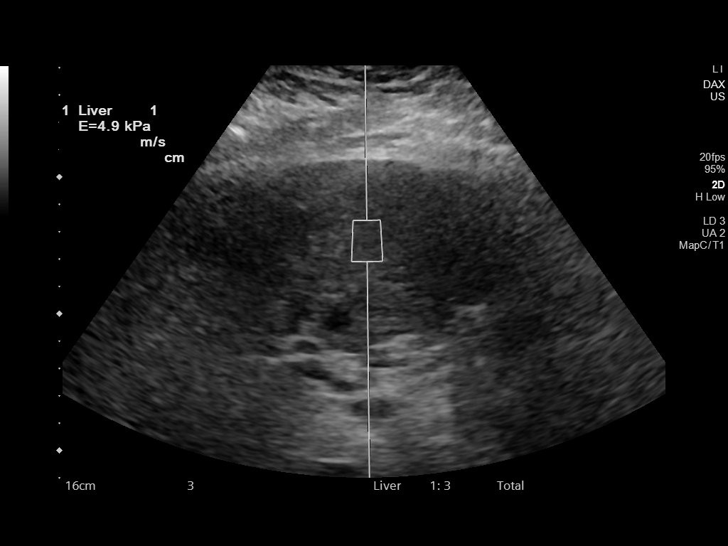
[im 7/25]
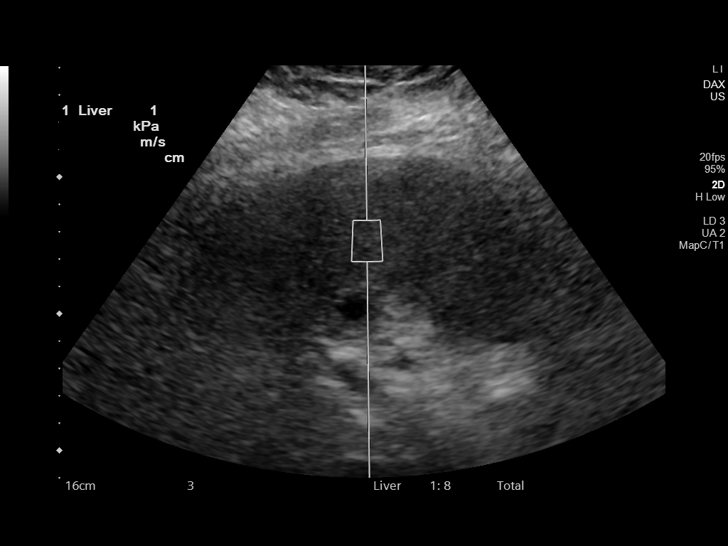
[im 11/25]
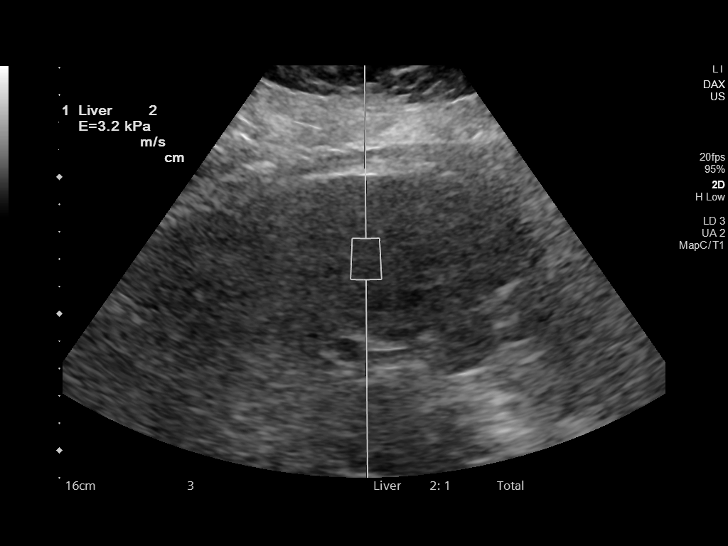
[im 15/25]
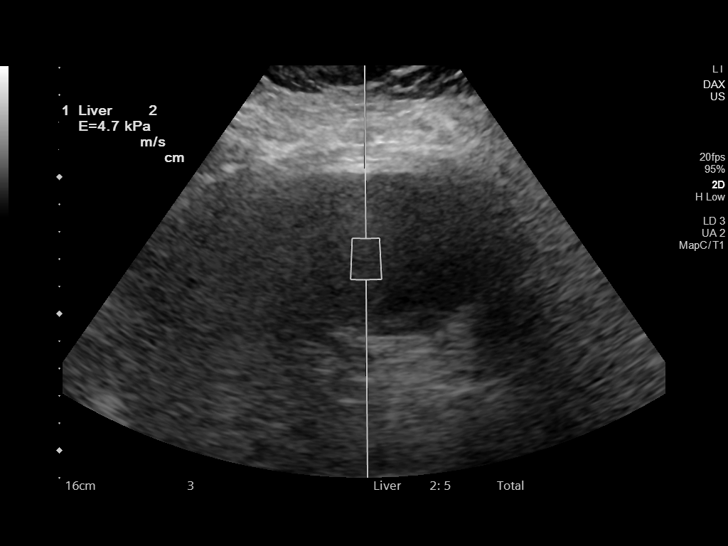
[im 19/25]
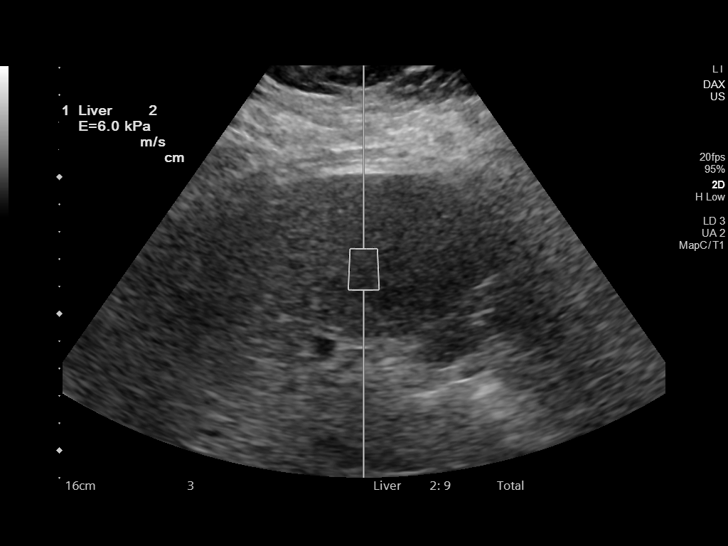
[im 23/25]
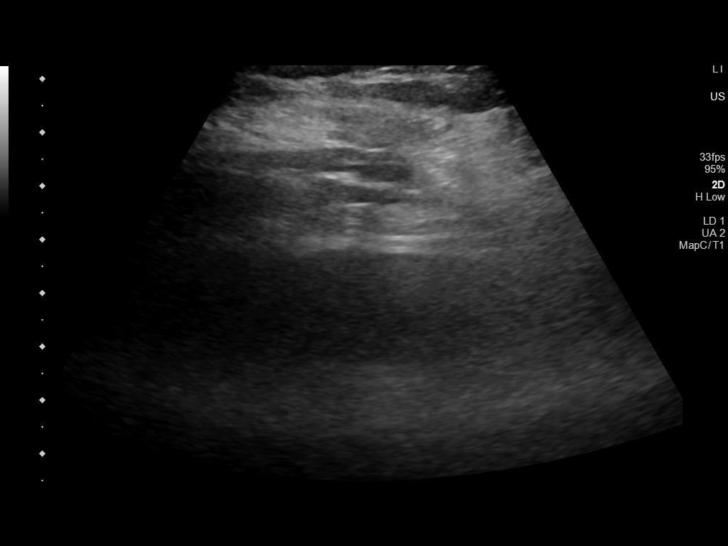

[12 of 25 positions shown; findings below may reference images not displayed]

FINDINGS: ULTRASOUND ABDOMEN LIMITED RIGHT UPPER QUADRANT

Gallbladder:

Surgically absent

Common bile duct:

Diameter: 7.4 mm

Liver:

No focal lesion identified. Increased echogenicity with coarsened
hepatic echotexture. Portal vein is patent on color Doppler imaging
with normal direction of blood flow towards the liver.

Study is limited by patient body habitus.

ULTRASOUND HEPATIC ELASTOGRAPHY

Device: Siemens Helix VTQ

Patient position: Oblique

Transducer BRACK TIGER

Number of measurements: 10

Hepatic segment:  8

Median kPa:

IQR:

IQR/Median kPa ratio:

Data quality: IQR/Median kPa ratio of 0.3 or greater indicates
reduced accuracy

Diagnostic category:  < or = 5 kPa: high probability of being normal

The use of hepatic elastography is applicable to patients with viral
hepatitis and non-alcoholic fatty liver disease. At this time, there
is insufficient data for the referenced cut-off values and use in
other causes of liver disease, including alcoholic liver disease.
Patients, however, may be assessed by elastography and serve as
their own reference standard/baseline.

In patients with non-alcoholic liver disease, the values suggesting
compensated advanced chronic liver disease (cACLD) may be lower, and
patients may need additional testing with elasticity results of [DATE]
kPa.

Please note that abnormal hepatic elasticity and shear wave
velocities may also be identified in clinical settings other than
with hepatic fibrosis, such as: acute hepatitis, elevated right
heart and central venous pressures including use of beta blockers,
Dumke disease (Rini), infiltrative processes such as
mastocytosis/amyloidosis/infiltrative tumor/lymphoma, extrahepatic
cholestasis, with hyperemia in the post-prandial state, and with
liver transplantation. Correlation with patient history, laboratory
data, and clinical condition recommended.

Diagnostic Categories:

< or =5 kPa: high probability of being normal

< or =9 kPa: in the absence of other known clinical signs, rules [DATE] kPa and ?13 kPa: suggestive of cACLD, but needs further testing

>13 kPa: highly suggestive of cACLD

> or =17 kPa: highly suggestive of cACLD with an increased
probability of clinically significant portal hypertension
IMPRESSION: ULTRASOUND RUQ:

Increased echogenicity and diffuse coarsening of the hepatic
echotexture is most commonly seen with fatty infiltration. No focal
parenchymal abnormality or evidence of intrahepatic biliary
dilation.

ULTRASOUND HEPATIC ELASTOGRAPHY:

Median kPa:

Diagnostic category:  < or = 5 kPa: high probability of being normal

## 2021-03-14 NOTE — Progress Notes (Signed)
Monitor was never returned and was marked "lost" in the [facility] system. Order will be cancelled

## 2021-03-21 ENCOUNTER — Other Ambulatory Visit: Payer: Self-pay

## 2021-03-21 ENCOUNTER — Telehealth: Payer: Self-pay | Admitting: Gastroenterology

## 2021-03-21 DIAGNOSIS — D126 Benign neoplasm of colon, unspecified: Secondary | ICD-10-CM

## 2021-03-21 NOTE — Telephone Encounter (Signed)
Patient mother, Earlie Server, called in asking for results from procedure done 3/22. States can call her son any time at  [phone] or call Earlie Server  around 4:45 [phone]

## 2021-03-21 NOTE — Telephone Encounter (Signed)
See result note.  

## 2021-04-03 ENCOUNTER — Telehealth: Payer: Self-pay | Admitting: Cardiology

## 2021-04-03 ENCOUNTER — Encounter: Payer: Self-pay | Admitting: *Deleted

## 2021-04-03 NOTE — Telephone Encounter (Signed)
[address] patient.  Thanks!

## 2021-04-03 NOTE — Telephone Encounter (Signed)
    Pt is calling back, he said the DOT clearance haven't receive it yet

## 2021-04-03 NOTE — Telephone Encounter (Signed)
He is clear. I will write a note as he had a recent cath with patent grafts.

## 2021-04-03 NOTE — Telephone Encounter (Signed)
Pt aware that DOT clearance letter was written by Dr. Johney Frame, and we faxed this to his requesting MD's office, at contact information provided in this message. Also informed him that a copy is in his mychart for him to review. Pt verbalized understanding and agrees with this plan. Pt was more than gracious for all the assistance provided.

## 2021-04-03 NOTE — Telephone Encounter (Signed)
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 Pt is unsure exactly what the DOT needs from our office to complete this.  Pt recently saw Ermalinda Barrios PA-C in the office for follow-up on 01/09/21, and is not due back to see and establish care with Dr. Johney Frame, until September 2022.  Pt had a cath done back in 10/2020. Advised the pt to call DOT after we get off the phone, and have them fax over their DOT clearance form they need our office to advise on, to [phone] ATTN: Dr. Johney Frame. Informed the pt that once we receive this information, we can further advise on his clearance, and send any necessary medical records they are requesting, via our medical records dept.  Advised him to call them now and have them fax this form. Pt states he will call them now and have them fax the DOT clearance form, to contact number provided to him. Pt verbalized understanding and agrees with this plan.

## 2021-04-03 NOTE — Telephone Encounter (Signed)
Pt aware that DOT clearance letter was re-faxed to [facility] again, with confirmation they received.  Faxed this back to their office at [phone]. Advised the pt to call us back tomorrow, if there is still issues with fax not received.  Pt verbalized understanding and agrees with this plan.

## 2021-04-03 NOTE — Telephone Encounter (Signed)
Called [facility] and spoke with their office about DOT clearance they are needing Dr. Johney Frame (former Meda Coffee pt) to advise on. Per the secretary there at [facility], all they need from our office to medically clear him from a cardiac perspective, is a short supporting letter from Dr. Johney Frame, stating that the pt is cleared from a cardiac perspective, for DOT clearance to drive a truck. [facility] phone number is [phone] and fax number to send clearance letter to is [phone] ATTN: [facility].  Endorsed the secretary at the urgent care that I will route this message to Dr Johney Frame to further review and advise on a short supporting letter, to clear the pt from a cardiac standpoint, for DOT clearance.  Informed the Secretary that if Dr. Johney Frame feels he needs further work-up done before clearing, we will let them know either way. Secretary at Urgent Care verbalized understanding and agrees with this plan. Pt made aware of this plan as well, and agrees with this plan.

## 2021-04-03 NOTE — Telephone Encounter (Signed)
Sean [name] is calling stating he went to get his DOT physical yesterday and was advised he has to be cleared by heart care yearly due to having heart surgery in 2017. Sean [name] is requesting to speak with [name] in regards to this. Please advise.

## 2021-04-03 NOTE — Telephone Encounter (Addendum)
DOT clearance letter was written by Dr. Johney Frame, and will be faxed to the contact information at [facility], as provided in this message. Attempted to call the pt back to make him aware his DOT letter was written and faxed to his requesting MD's office.  Pt did not answer and voicemail was not set-up at this time. Will endorse to him when he calls back, that letter was sent and also a copy is in his active mychart account.

## 2021-04-04 DIAGNOSIS — E782 Mixed hyperlipidemia: Secondary | ICD-10-CM | POA: Diagnosis not present

## 2021-04-04 DIAGNOSIS — Z7901 Long term (current) use of anticoagulants: Secondary | ICD-10-CM | POA: Diagnosis not present

## 2021-04-04 DIAGNOSIS — N1832 Chronic kidney disease, stage 3b: Secondary | ICD-10-CM | POA: Diagnosis not present

## 2021-04-04 DIAGNOSIS — D696 Thrombocytopenia, unspecified: Secondary | ICD-10-CM | POA: Diagnosis not present

## 2021-04-04 DIAGNOSIS — Z79899 Other long term (current) drug therapy: Secondary | ICD-10-CM | POA: Diagnosis not present

## 2021-04-04 DIAGNOSIS — B009 Herpesviral infection, unspecified: Secondary | ICD-10-CM | POA: Insufficient documentation

## 2021-04-04 DIAGNOSIS — R5381 Other malaise: Secondary | ICD-10-CM | POA: Diagnosis not present

## 2021-04-04 DIAGNOSIS — Z6841 Body Mass Index (BMI) 40.0 and over, adult: Secondary | ICD-10-CM | POA: Diagnosis not present

## 2021-04-04 DIAGNOSIS — M1009 Idiopathic gout, multiple sites: Secondary | ICD-10-CM | POA: Diagnosis not present

## 2021-04-04 DIAGNOSIS — Z0001 Encounter for general adult medical examination with abnormal findings: Secondary | ICD-10-CM | POA: Diagnosis not present

## 2021-04-04 DIAGNOSIS — J301 Allergic rhinitis due to pollen: Secondary | ICD-10-CM | POA: Diagnosis not present

## 2021-04-04 DIAGNOSIS — K2101 Gastro-esophageal reflux disease with esophagitis, with bleeding: Secondary | ICD-10-CM

## 2021-04-04 DIAGNOSIS — Z0000 Encounter for general adult medical examination without abnormal findings: Secondary | ICD-10-CM | POA: Diagnosis not present

## 2021-04-04 DIAGNOSIS — G4733 Obstructive sleep apnea (adult) (pediatric): Secondary | ICD-10-CM | POA: Diagnosis not present

## 2021-04-04 DIAGNOSIS — I2510 Atherosclerotic heart disease of native coronary artery without angina pectoris: Secondary | ICD-10-CM | POA: Diagnosis not present

## 2021-04-04 DIAGNOSIS — R5383 Other fatigue: Secondary | ICD-10-CM | POA: Diagnosis not present

## 2021-04-04 DIAGNOSIS — E039 Hypothyroidism, unspecified: Secondary | ICD-10-CM | POA: Diagnosis not present

## 2021-04-04 DIAGNOSIS — I129 Hypertensive chronic kidney disease with stage 1 through stage 4 chronic kidney disease, or unspecified chronic kidney disease: Secondary | ICD-10-CM | POA: Diagnosis not present

## 2021-04-04 HISTORY — DX: Gastro-esophageal reflux disease with esophagitis, with bleeding: K21.01

## 2021-04-09 DIAGNOSIS — G4733 Obstructive sleep apnea (adult) (pediatric): Secondary | ICD-10-CM | POA: Diagnosis not present

## 2021-04-22 ENCOUNTER — Ambulatory Visit
Admission: RE | Admit: 2021-04-22 | Discharge: 2021-04-22 | Disposition: A | Discharge status: Home / Self Care | Payer: BC Managed Care – PPO | Source: Ambulatory Visit | Attending: Gastroenterology | Admitting: Gastroenterology

## 2021-04-22 DIAGNOSIS — M5136 Other intervertebral disc degeneration, lumbar region: Secondary | ICD-10-CM | POA: Diagnosis not present

## 2021-04-22 DIAGNOSIS — J986 Disorders of diaphragm: Secondary | ICD-10-CM | POA: Diagnosis not present

## 2021-04-22 DIAGNOSIS — M4317 Spondylolisthesis, lumbosacral region: Secondary | ICD-10-CM | POA: Diagnosis not present

## 2021-04-22 DIAGNOSIS — D126 Benign neoplasm of colon, unspecified: Secondary | ICD-10-CM

## 2021-04-22 DIAGNOSIS — G4733 Obstructive sleep apnea (adult) (pediatric): Secondary | ICD-10-CM | POA: Diagnosis not present

## 2021-04-22 DIAGNOSIS — J301 Allergic rhinitis due to pollen: Secondary | ICD-10-CM | POA: Diagnosis not present

## 2021-04-22 DIAGNOSIS — E662 Morbid (severe) obesity with alveolar hypoventilation: Secondary | ICD-10-CM | POA: Diagnosis not present

## 2021-04-22 DIAGNOSIS — K635 Polyp of colon: Secondary | ICD-10-CM | POA: Diagnosis not present

## 2021-04-26 DIAGNOSIS — E039 Hypothyroidism, unspecified: Secondary | ICD-10-CM | POA: Diagnosis not present

## 2021-05-15 ENCOUNTER — Ambulatory Visit (INDEPENDENT_AMBULATORY_CARE_PROVIDER_SITE_OTHER): Payer: BC Managed Care – PPO | Admitting: Gastroenterology

## 2021-05-15 ENCOUNTER — Encounter: Payer: Self-pay | Admitting: Gastroenterology

## 2021-05-15 VITALS — BP 132/80 | HR 59 | Ht 71.0 in | Wt 364.0 lb

## 2021-05-15 DIAGNOSIS — R1013 Epigastric pain: Secondary | ICD-10-CM

## 2021-05-15 MED ORDER — PANTOPRAZOLE SODIUM 40 MG PO TBEC: 40.0000 mg | DELAYED_RELEASE_TABLET | Freq: Two times a day (BID) | ORAL | 3 refills | Status: DC

## 2021-05-15 MED ORDER — SUCRALFATE 1 G PO TABS: ORAL_TABLET | ORAL | 2 refills | Status: DC

## 2021-05-15 MED ORDER — FAMOTIDINE 20 MG PO TABS: 20.0000 mg | ORAL_TABLET | Freq: Every day | ORAL | 3 refills | Status: DC

## 2021-05-15 NOTE — Patient Instructions (Addendum)
It was my pleasure to provide care to you you today. Based on our discussion, I am providing you with my recommendations below:  RECOMMENDATION(S):   Work to maintain a healthy weight  We will plan to repeat your Endoscopy in 3 years  We will plan to repeat your CT colonography in 2027  PRESCRIPTION MEDICATION(S):   We have sent the following medication(s) to your pharmacy:  Pantoprazole - please take 40mg  by mouth 2 times Famotidine - please take 20mg  by mouth daily at bedtime Continue Carafate 1gram by mouth 2-4 times daily  NOTE: If your medication(s) requires a PRIOR AUTHORIZATION, we will receive notification from your pharmacy. Once received, the process to submit for approval may take up to 7-10 business days. You will be contacted about any denials we have received from your insurance company as well as alternatives recommended by your provider.  FOLLOW UP:  I would like for you to follow up with me in 6 months. Please call the office at [phone] to schedule your appointment.  BMI:  If you are age 23 or older, your body mass index should be between 23-30. Your Body mass index is 50.77 kg/m. If this is out of the aforementioned range listed, please consider follow up with your Primary Care Provider.  If you are age 42 or younger, your body mass index should be between 19-25. Your Body mass index is 50.77 kg/m. If this is out of the aformentioned range listed, please consider follow up with your Primary Care Provider.   MY CHART:  The [facility] providers would like to encourage you to use [facility] to communicate with providers for non-urgent requests or questions.  Due to long hold times on the telephone, sending your provider a message by [facility] may be a faster and more efficient way to get a response.  Please allow 48 business hours for a response.  Please remember that this is for non-urgent requests.   Thank you for trusting me with your gastrointestinal care!     Thornton Park, MD, MPH

## 2021-05-15 NOTE — Progress Notes (Signed)
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 May be related to gastric erosions/gastritis. Increase PPI to BID and add famotidine. He has used Carafate in the past, will resume PRN.  Work to maintain a healthy weight. Low threshold to pursue contrasted cross-sectional imaging with any alarm features.  Gastric intestinal metaplasia:  Reviewed pathology results. Repeat EGD recommended for gastric mapping in 3 years.  History of tubular adenoma with incomplete colonoscopy 2022: Consider repeat trial at colonoscopy +/- CT colonography 2027.  Suspected underlying liver disease given thrombocytopenia. Elastography showed fatty liver but no advanced fibrosis, however, the study was limited in accuracy due to body habitus. Plan FibroSURE for additional evaluation.      PLAN: Increase pantoprazole 40 mg BID Add famotidine 20 mg QHS Continue Carafate 1g BID-QID Work to maintain a health weight EGD in 3 years - Procedures at the hospital CT colonography 2027 for colon cancer screening FibroSURE for further assessment of underlying liver disease Follow-up in 6 months, earlier as needed   Please see the "Patient Instructions" section for addition details about the plan.  HPI: Sean [name] is a 50 y.o. male initially referred by his cardiologist for epigastric pain.  He has a history of hyperlipidemia, hypertension, chronic kidney disease,  obstructive sleep apnea, coronary artery disease and coronary artery bypass in 2017, NASH, reflux, and thrombocytopenia who has been under evaluation for epigastric pain.  He is on chronic Eliquis. 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   He was seen by GI at [facility] June 2021 for multiple symptoms.  (1) He reports a 35 year history of epigastric pain. Associated distension.  As a child, his family thought it was because he didn't want to go to school.   Worsens with alcohol use.  Relieved by Carafate.   (2) Longstanding history of GERD treated the best with esomeprazole.  Did not find pantoprazole as effective. Frequently sleeps elevated in a recliner.  (3) He has early morning emesis that lasts 30 to 45 minutes.  Occasional bleed with streaks of blood.  Feels better after vomiting. Occurs mostly on days when he gets up earlier than his routine. No dysphagia, odynophagia, dysphonia, sore throat, neck pain.   (4) Also reports a lower abdominal pain for 2 months without altered bowel habits. Rare constipation and diarrhea. Bright red blood in the stool prior to Christmas. No mucous.   (5) Liver enzymes have been elevated since 2018. Thrombocytopenia present since 2015. Abdominal ultrasound 01/2018 showed fatty liver Labs 03/2020 showed elevated liver enzymes with alk phos 214, AST 41, ALT 54 and thrombocytopenia with platelets of 54.  TSH 4.193.  Normal iron profile. Labs 10/2020 showed platelets 58, hemoglobin 14.4  Rare beer with supper. No history of heavy drinking. No illicit street drugs. No tobacco, chewing tobacco, or e-cigarettes.   He had an EGD and incomplete colonoscopy 04/21/21 to the distal ascending colon.  Pathology results from his EGD and colonoscopy show gastropathy with intestinal metaplasia, hyperplastic gastric  polyps, and normal esophageal biopsies. The polyp removed from his colonoscopy was a small tubular adenoma.   CT colonography 04/22/21 showed no  polyp or mass  Elastography 01/15/21 showed an echogenic liver, Median kPa: 3.7; however, the study was limited in accuracy due to body habitus.   Returns today with ongoing abdominal pain and early morning nausea and vomiting as reported at the time of his initial consultation. No new complaints or concerns.   Labs 01/02/21: alk phos 172, AST 33, ALT 41, alk phos 172, platelets 74   Past Medical History:  Diagnosis Date   Abnormal liver enzymes    a. Sees a doctor in [place].   Cardiac cirrhosis    a. possible elevated LFTs/low platelets felt due to cardiac cirrhosis per 2017 admission.   Chronic combined systolic and diastolic CHF (congestive heart failure) (HCC)    CKD (chronic kidney disease), stage II    Congenital heart defect    a. rightward rotation of heart, almost dextrocardia   Coronary artery disease    a. s/p CABGx1 in 12/2015.   Hematuria    a. Chronic hx of this, no prior etiology determined through workup per patient.   Hypercholesteremia    a. Prev taken off statin due to abnormal liver function.   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   LIMA to LAD   Sinus bradycardia    Thrombocytopenia ([place])     Past Surgical History:  Procedure Laterality Date   APPENDECTOMY     BIOPSY  02/19/2021   Procedure: BIOPSY;  Surgeon: Thornton Park, MD;  Location: [facility];  Service: Gastroenterology;;   CARDIAC CATHETERIZATION N/A 12/24/2015   Procedure: Right/Left Heart Cath and Coronary Angiography;  Surgeon: Jolaine Artist, MD;  Location: [place] [facility];  Service: Cardiovascular;  Laterality: N/A;   CARDIOVERSION N/A 02/14/2016   Procedure: CARDIOVERSION;  Surgeon: Pixie Casino, MD;  Location: [facility] END[facility];  Service: Cardiovascular;  Laterality: N/A;   COLONOSCOPY WITH PROPOFOL N/A 02/19/2021   Procedure: COLONOSCOPY WITH PROPOFOL;  Surgeon: Thornton Park, MD;   Location: [facility];  Service: Gastroenterology;  Laterality: N/A;   CORONARY ARTERY BYPASS GRAFT N/A 12/26/2015   Procedure: Off Pump Coronary Artery Bypass Grafting times one using left internal mammary artery;  Surgeon: Rexene Alberts, MD;  Location: [place];  Service: Open Heart Surgery;  Laterality: N/A;   ESOPHAGOGASTRODUODENOSCOPY (EGD) WITH PROPOFOL N/A 02/19/2021   Procedure: ESOPHAGOGASTRODUODENOSCOPY (EGD) WITH PROPOFOL;  Surgeon: Thornton Park, MD;  Location: [facility];  Service: Gastroenterology;  Laterality: N/A;   GALLBLADDER SURGERY     HERNIA REPAIR     LEFT HEART CATH AND CORS/GRAFTS ANGIOGRAPHY N/A 04/15/2017   Procedure: Left Heart Cath and Cors/Grafts Angiography;  Surgeon: Troy Sine, MD;  Location: [place] [facility];  Service: Cardiovascular;  Laterality: N/A;   LEFT HEART CATH AND CORS/GRAFTS ANGIOGRAPHY N/A 11/02/2020   Procedure: LEFT HEART CATH AND CORS/GRAFTS ANGIOGRAPHY;  Surgeon: Sherren Mocha, MD;  Location: [place] [facility];  Service: Cardiovascular;  Laterality: N/A;   POLYPECTOMY  02/19/2021   Procedure: POLYPECTOMY;  Surgeon: Thornton Park, MD;  Location: [facility];  Service: Gastroenterology;;   TEE WITHOUT CARDIOVERSION N/A 12/26/2015   Procedure: TRANSESOPHAGEAL ECHOCARDIOGRAM (TEE);  Surgeon: Rexene Alberts, MD;  Location: [place];  Service: Open Heart Surgery;  Laterality: N/A;   TEE  WITHOUT CARDIOVERSION N/A 02/14/2016   Procedure: TRANSESOPHAGEAL ECHOCARDIOGRAM (TEE);  Surgeon: Pixie Casino, MD;  Location: [facility] ENDOSCOPY;  Service: Cardiovascular;  Laterality: N/A;    Current Outpatient Medications  Medication Sig Dispense Refill   acetaminophen (TYLENOL) 500 MG tablet Take 500-1,500 mg by mouth at bedtime.     allopurinol (ZYLOPRIM) 100 MG tablet Take 1 tablet (100 mg total) by mouth daily. 30 tablet 1   cetirizine (ZYRTEC) 10 MG tablet Take 10 mg by mouth daily.     Cyanocobalamin (VITAMIN B-12) 5000 MCG SUBL Place 5,000 mcg under  the tongue daily.     diltiazem (CARDIZEM CD) 120 MG 24 hr capsule TAKE ONE CAPSULE BY MOUTH DAILY (Patient taking differently: Take 120 mg by mouth daily.) 90 capsule 1   ELIQUIS 5 MG TABS tablet TAKE 1 TABLET BY MOUTH TWICE DAILY. (Patient taking differently: Take 5 mg by mouth 2 (two) times daily.) 60 tablet 5   ezetimibe (ZETIA) 10 MG tablet TAKE 1 TABLET(10 MG) BY MOUTH DAILY 90 tablet 3   famotidine (PEPCID) 20 MG tablet Take 1 tablet (20 mg total) by mouth at bedtime. 90 tablet 3   furosemide (LASIX) 40 MG tablet Take 40 mg by mouth daily as needed for fluid.     levothyroxine (SYNTHROID) 50 MCG tablet TAKE 1 TABLET(50 MCG) BY MOUTH DAILY     metoprolol succinate (TOPROL-XL) 25 MG 24 hr tablet Take 25 mg by mouth daily.     montelukast (SINGULAIR) 10 MG tablet Take 10 mg by mouth daily.     nitroGLYCERIN (NITROSTAT) 0.4 MG SL tablet Place 1 tablet (0.4 mg total) under the tongue every 5 (five) minutes as needed for chest pain. 25 tablet 12   pantoprazole (PROTONIX) 40 MG tablet Take 1 tablet (40 mg total) by mouth 2 (two) times daily. 180 tablet 3   potassium chloride (K-DUR,KLOR-CON) 10 MEQ tablet TAKE 1 TABLET ONCE DAILY. (Patient taking differently: Take 10 mEq by mouth daily.) 30 tablet 8   REPATHA SURECLICK 443 MG/ML SOAJ INJECT THE CONTENTS OF 1 SYRINGE UNDER THE SKIN EVERY 14 DAYS (Patient taking differently: Inject 140 mg into the skin every 14 (fourteen) days.) 2 pen 11   sucralfate (CARAFATE) 1 g tablet Dissolve 1 tablet in a small amount of water to make a slurry and take 2-4 times daily 120 tablet 2   No current facility-administered medications for this visit.    Allergies as of 05/15/2021 - Review Complete 05/15/2021  Allergen Reaction Noted   Imdur [isosorbide dinitrate] Diarrhea 04/16/2017   Augmentin [amoxicillin-pot clavulanate] Itching 04/07/2016   Tape Rash 04/14/2017    Family History  Problem Relation Age of Onset   CAD Father        2 stents ~ 30   Diabetes  Father    Heart attack Other    Hyperlipidemia Other    Diabetes Maternal Grandfather    Diabetes Maternal Uncle       Physical Exam: General:   Alert,  well-nourished, pleasant and cooperative in NAD Head:  Normocephalic and atraumatic. Eyes:  Sclera clear, no icterus.   Conjunctiva pink. Abdomen:  Soft, central obesity, nontender, nondistended, normal bowel sounds, no rebound or guarding. No hepatosplenomegaly.   Neurologic:  Alert and  oriented x4;  grossly nonfocal Skin:  Intact without significant lesions or rashes. Psych:  Alert and cooperative. Normal mood and affect.   Jaydyn Menon L. Tarri Glenn, MD, MPH 05/15/2021, 1:27 PM

## 2021-06-11 DIAGNOSIS — E039 Hypothyroidism, unspecified: Secondary | ICD-10-CM | POA: Diagnosis not present

## 2021-06-28 DIAGNOSIS — E662 Morbid (severe) obesity with alveolar hypoventilation: Secondary | ICD-10-CM | POA: Diagnosis not present

## 2021-06-28 DIAGNOSIS — G4733 Obstructive sleep apnea (adult) (pediatric): Secondary | ICD-10-CM | POA: Diagnosis not present

## 2021-06-28 DIAGNOSIS — J301 Allergic rhinitis due to pollen: Secondary | ICD-10-CM | POA: Diagnosis not present

## 2021-07-09 DIAGNOSIS — G4733 Obstructive sleep apnea (adult) (pediatric): Secondary | ICD-10-CM | POA: Diagnosis not present

## 2021-07-09 DIAGNOSIS — Z1152 Encounter for screening for COVID-19: Secondary | ICD-10-CM | POA: Diagnosis not present

## 2021-07-09 DIAGNOSIS — J019 Acute sinusitis, unspecified: Secondary | ICD-10-CM | POA: Diagnosis not present

## 2021-08-12 NOTE — Progress Notes (Signed)
Cardiology Office Note:    Date:  08/13/2021   ID:  Sean [name], DOB 10-07-1971, MRN [id number]  PCP:  Raina Mina., MD   [facility] Providers Cardiologist:  Ena Dawley, MD (Inactive)     Referring MD: Raina Mina., MD   CC: Transition to new cardiologist  History of Present Illness:    Sean [name] is a 50 y.o. male with a hx of  CAD s/p CABG in 2017 (LIMA to LAD), HTN, HLD, Morbid Obesity, PAF with prior DCCV.  OSA on CPAP, and Recovered ischemia cardiomyopathy.  He has a history of statin intolerance.  Seen 08/13/21  Patient notes that he is doing well.  Since last visit notes working constantly.  Relevant interval testing or therapy include only taking PRN lasix.  There are no interval hospital/ED visit.    No chest pain or pressure.  No SOB/DOE and no PND/Orthopnea.  No weight gain or leg swelling.  No palpitations or syncope.  Notes occasional fatigue.  Notes leg pain and discoloration worse with driving but also work with walking uphill.  Index pain was jaw and neck pain with left arm numbness and DOE.   Past Medical History:  Diagnosis Date   Abnormal liver enzymes    a. Sees a doctor in [place].   Cardiac cirrhosis    a. possible elevated LFTs/low platelets felt due to cardiac cirrhosis per 2017 admission.   Chronic combined systolic and diastolic CHF (congestive heart failure) (HCC)    CKD (chronic kidney disease), stage II    Congenital heart defect    a. rightward rotation of heart, almost dextrocardia   Coronary artery disease    a. s/p CABGx1 in 12/2015.   Hematuria    a. Chronic hx of this, no prior etiology determined through workup per patient.   Hypercholesteremia    a. Prev taken off statin due to abnormal liver function.   Hypertension    Morbid obesity ([place])    OSA on CPAP    Paroxysmal atrial flutter ([place]) 02/12/2016   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   LIMA to LAD   Sinus bradycardia    Thrombocytopenia ([place])      Past Surgical History:  Procedure Laterality Date   APPENDECTOMY     BIOPSY  02/19/2021   Procedure: BIOPSY;  Surgeon: Thornton Park, MD;  Location: [facility];  Service: Gastroenterology;;   CARDIAC CATHETERIZATION N/A 12/24/2015   Procedure: Right/Left Heart Cath and Coronary Angiography;  Surgeon: Jolaine Artist, MD;  Location: [place] [facility];  Service: Cardiovascular;  Laterality: N/A;   CARDIOVERSION N/A 02/14/2016   Procedure: CARDIOVERSION;  Surgeon: Pixie Casino, MD;  Location: [facility];  Service: Cardiovascular;  Laterality: N/A;   COLONOSCOPY WITH PROPOFOL N/A 02/19/2021   Procedure: COLONOSCOPY WITH PROPOFOL;  Surgeon: Thornton Park, MD;  Location: [facility];  Service: Gastroenterology;  Laterality: N/A;   CORONARY ARTERY BYPASS GRAFT N/A 12/26/2015   Procedure: Off Pump Coronary Artery Bypass Grafting times one using left internal mammary artery;  Surgeon: Rexene Alberts, MD;  Location: [place];  Service: Open Heart Surgery;  Laterality: N/A;   ESOPHAGOGASTRODUODENOSCOPY (EGD) WITH PROPOFOL N/A 02/19/2021   Procedure: ESOPHAGOGASTRODUODENOSCOPY (EGD) WITH PROPOFOL;  Surgeon: Thornton Park, MD;  Location: [facility];  Service: Gastroenterology;  Laterality: N/A;   GALLBLADDER SURGERY     HERNIA REPAIR     LEFT HEART CATH AND CORS/GRAFTS ANGIOGRAPHY N/A 04/15/2017   Procedure: Left Heart Cath and  Cors/Grafts Angiography;  Surgeon: Troy Sine, MD;  Location: [place] [facility];  Service: Cardiovascular;  Laterality: N/A;   LEFT HEART CATH AND CORS/GRAFTS ANGIOGRAPHY N/A 11/02/2020   Procedure: LEFT HEART CATH AND CORS/GRAFTS ANGIOGRAPHY;  Surgeon: Sherren Mocha, MD;  Location: [place] [facility];  Service: Cardiovascular;  Laterality: N/A;   POLYPECTOMY  02/19/2021   Procedure: POLYPECTOMY;  Surgeon: Thornton Park, MD;  Location: [facility];  Service: Gastroenterology;;   TEE WITHOUT CARDIOVERSION N/A 12/26/2015   Procedure: TRANSESOPHAGEAL  ECHOCARDIOGRAM (TEE);  Surgeon: Rexene Alberts, MD;  Location: [place];  Service: Open Heart Surgery;  Laterality: N/A;   TEE WITHOUT CARDIOVERSION N/A 02/14/2016   Procedure: TRANSESOPHAGEAL ECHOCARDIOGRAM (TEE);  Surgeon: Pixie Casino, MD;  Location: [facility] ENDOSCOPY;  Service: Cardiovascular;  Laterality: N/A;    Current Medications: Current Meds  Medication Sig   acetaminophen (TYLENOL) 500 MG tablet Take 500-1,500 mg by mouth at bedtime.   allopurinol (ZYLOPRIM) 100 MG tablet Take 1 tablet (100 mg total) by mouth daily.   cetirizine (ZYRTEC) 10 MG tablet Take 10 mg by mouth daily.   Cyanocobalamin (VITAMIN B-12) 5000 MCG SUBL Place 5,000 mcg under the tongue daily.   ELIQUIS 5 MG TABS tablet TAKE 1 TABLET BY MOUTH TWICE DAILY.   furosemide (LASIX) 40 MG tablet Take 40 mg by mouth daily as needed for fluid.   levothyroxine (SYNTHROID) 50 MCG tablet TAKE 1 TABLET(50 MCG) BY MOUTH DAILY   metoprolol succinate (TOPROL-XL) 25 MG 24 hr tablet Take 25 mg by mouth daily.   montelukast (SINGULAIR) 10 MG tablet Take 10 mg by mouth daily.   nitroGLYCERIN (NITROSTAT) 0.4 MG SL tablet Place 1 tablet (0.4 mg total) under the tongue every 5 (five) minutes as needed for chest pain.   pantoprazole (PROTONIX) 40 MG tablet Take 1 tablet (40 mg total) by mouth 2 (two) times daily.   potassium chloride (K-DUR,KLOR-CON) 10 MEQ tablet TAKE 1 TABLET ONCE DAILY.   REPATHA SURECLICK 341 MG/ML SOAJ INJECT THE CONTENTS OF 1 SYRINGE UNDER THE SKIN EVERY 14 DAYS   sucralfate (CARAFATE) 1 g tablet Dissolve 1 tablet in a small amount of water to make a slurry and take 2-4 times daily   [DISCONTINUED] diltiazem (CARDIZEM CD) 120 MG 24 hr capsule TAKE ONE CAPSULE BY MOUTH DAILY     Allergies:   Imdur [isosorbide dinitrate], Augmentin [amoxicillin-pot clavulanate], and Tape   Social History   Socioeconomic History   Marital status: Legally Separated    Spouse name: Not on file   Number of children: Not on file    Years of education: Not on file   Highest education level: Not on file  Occupational History   Occupation: Truck Geophysicist/field seismologist  Tobacco Use   Smoking status: Never   Smokeless tobacco: Never  Vaping Use   Vaping Use: Never used  Substance and Sexual Activity   Alcohol use: Yes    Comment: occasional   Drug use: No   Sexual activity: Yes  Other Topics Concern   Not on file  Social History Narrative   Not on file   Social Determinants of Health   Financial Resource Strain: Not on file  Food Insecurity: Not on file  Transportation Needs: Not on file  Physical Activity: Not on file  Stress: Not on file  Social Connections: Not on file    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  ROS:   Please see the history of present illness.     All other systems reviewed and are negative.  EKGs/Labs/Other Studies Reviewed:    The following studies were reviewed today:  EKG:  EKG is  ordered today.  The ekg ordered today demonstrates  08/13/21: sinus brady rate 53 with LVH  Transthoracic Echocardiogram: Date: 03/31/2016 Results: - Procedure narrative: Transthoracic echocardiography. Image    quality was suboptimal. The study was technically difficult.    Intravenous contrast (Definity) was administered.  - Left ventricle: The cavity size was normal. Wall thickness was    normal. Systolic function was mildly reduced. The estimated    ejection fraction was in the range of 45% to 50%. Features are    consistent with a pseudonormal left ventricular filling pattern,    with concomitant abnormal relaxation and increased filling    pressure (grade 2 diastolic dysfunction).   NM Stress Date: 10/17/2020 Results: Nuclear stress EF: 60%. The left ventricular ejection  fraction is normal (55-65%). There was no ST segment deviation noted during stress. No T wave inversion was noted during stress. This is an intermediate risk study.   1. There is a moderate to severe fixed defect of medium size present in the inferior segments, base to apex (15-20% of LV). The wall motion in this region appears normal, so this either represents prior infarction or diaphragm attenuation.  2. There is a moderate to severe, partially reversible, defect present on stress imaging that is medium in size (~10-15% of LV) in the base to mid anterior wall, into the apical septum. The defect is present on rest imaging in the apical septum and mid anterior wall. The wall motion in this region appears to be normal. This either represents prior infarct with peri-infarct ischemia or breast attenuation given the patient's size (BMI 52).  3. Normal LVEF, 60%.  4. This is an intermediate risk study and likely equivocal due to reasons stated above.  Left/Right Heart Catheterizations: Date:11/02/20 Results: 1.  Severe single-vessel coronary artery disease with total occlusion of the ostial LAD 2.  Widely patent left main, left circumflex, and RCA with no significant stenoses 3.  Patent LIMA to LAD with no significant stenosis present 4.  Normal LVEDP   Recommendations: Ongoing medical therapy.  The patient has stable coronary anatomy with continued patency of the LIMA graft.  Patient may resume apixaban tomorrow morning at his normal dosing schedule.   Recent Labs: 01/02/2021: ALT 41; BUN 21; Creatinine, Ser 1.60; Hemoglobin 14.6; Platelets 74.0; Potassium 4.2; Sodium 141  Recent Lipid Panel    Component Value Date/Time   CHOL 107 08/13/2020 0909   TRIG 118 08/13/2020 0909   HDL 49 08/13/2020 0909   CHOLHDL 2.2 08/13/2020 0909   CHOLHDL 2.7 11/26/2016 0912   VLDL 18 11/26/2016 0912   LDLCALC 37 08/13/2020 0909   LDLDIRECT 45 10/21/2019 0942     Risk Assessment/Calculations:     CHA2DS2-VASc Score = 2   This indicates a 2.2% annual risk of stroke. The patient's score is based upon: CHF History: 1 HTN History: 0 Diabetes History: 0 Stroke History: 0 Vascular Disease History: 1 Age Score: 0 Gender Score: 0          Physical Exam:    VS:  BP 136/78   Pulse (!) 53   Ht 5\' 11"  (1.803  m)   Wt (!) 377 lb 9.6 oz (171.3 kg)   SpO2 96%   BMI 52.66 kg/m     Wt Readings from Last 3 Encounters:  08/13/21 (!) 377 lb 9.6 oz (171.3 kg)  05/15/21 (!) 364 lb (165.1 kg)  02/19/21 (!) 365 lb (165.6 kg)     GEN: Morbidly obese well developed in no acute distress HEENT: Normal NECK: No JVD but thick neck LYMPHATICS: No lymphadenopathy CARDIAC: regular bradycardia, no murmurs, rubs, gallops distant heart sounds RESPIRATORY:  Clear to auscultation without rales, wheezing or rhonchi  ABDOMEN: Soft, non-tender, non-distended MUSCULOSKELETAL:  +2 edema; No deformity  SKIN: Warm and dry NEUROLOGIC:  Alert and oriented x 3 PSYCHIATRIC:  Normal affect   ASSESSMENT:    1. Coronary artery disease involving native coronary artery of native heart without angina pectoris   2. Atrial flutter, unspecified type ([place])   3. Mixed hyperlipidemia   4. Essential hypertension   5. Morbid obesity with BMI of 45.0-49.9, adult ([place])   6. OSA on CPAP    PLAN:    CAD Ischemic cardiomyopathy (recovered) HLD -on eliquis because of AF - continue repatha - has PRN nitro (no needed) - will check BNP; if elevated despite morbid obesity will need to repeat echo - will check CMP - may need lasix 20 mg PO daily (only has PRN lasix) - may not need potassium - continue compressions  - Repatha - DOT license: 01/[id number] - concern he would not fit Bore size for CMR  PAF (when in AF feels symptomatic with fatigue) HTN Morbid Obesity OSA on CPAP - would cut diltiazem 120 mg PO daily - would continue metoprolol 25 mg PO daily; can increase if necessary    Will plan for 6 months  follow up unless new symptoms or abnormal test results warranting change in plan Would be reasonable for  APP Follow up   Time Spent Directly with Patient:   I have spent a total of 40 minutes with the patient reviewing notes, imaging, EKGs, labs and examining the patient as well as establishing an assessment and plan that was discussed personally with the patient.  > 50% of time was spent in direct patient care and family.  Patient will decide to see me to Dr. Johney Frame after her maternity leave  Medication Adjustments/Labs and Tests Ordered: Current medicines are reviewed at length with the patient today.  Concerns regarding medicines are outlined above.  Orders Placed This Encounter  Procedures   Pro b natriuretic peptide (BNP)   Comprehensive metabolic panel   Cardiac Stress Test: Informed Consent Details: Physician/Practitioner Attestation; Transcribe to consent form and obtain patient signature   MYOCARDIAL PERFUSION IMAGING   EKG 12-Lead    No orders of the defined types were placed in this encounter.   Patient Instructions  Medication Instructions:  Your physician has recommended you make the following change in your medication:   STOP: diltiazem (Cardizem) *If you need a refill on your cardiac medications before your next appointment, please call your pharmacy*   Lab Work: TODAY: BNP, CMP If you have labs (blood work) drawn today and your tests are completely normal, you will receive your results only by: [place] (if you have MyChart) OR A paper copy in the mail If you have any lab test that is abnormal or we need to change your treatment, we will call you to review the results.   Testing/Procedures: Your physician has requested that you have a [place]  NOV. For further information please visit [email or web address]. Please follow instruction sheet, as given.    Follow-Up: At [facility], you and your health needs are our priority.  As part of  our continuing mission to provide you with exceptional heart care, we have created designated Provider Care Teams.  These Care Teams include your primary Cardiologist (physician) and Advanced Practice Providers (APPs -  Physician Assistants and Nurse Practitioners) who all work together to provide you with the care you need, when you need it.  We recommend signing up for the patient portal called "MyChart".  Sign up information is provided on this After Visit Summary.  MyChart is used to connect with patients for Virtual Visits (Telemedicine).  Patients are able to view lab/test results, encounter notes, upcoming appointments, etc.  Non-urgent messages can be sent to your provider as well.   To learn more about what you can do with MyChart, go to [email or web address].    Your next appointment:   6 month(s)  The format for your next appointment:   In Person  Provider:   You may see Rudean Haskell, MD or one of the following Advanced Practice Providers on your designated Care Team:   Melina Copa, PA-C Ermalinda Barrios, PA-C   Other Instructions  You are scheduled for a Myocardial Perfusion Imaging Study. Please arrive 15 minutes prior to your appointment time for registration and insurance purposes.   The test will take approximately 3 to 4 hours to complete; you may bring reading material.  If someone comes with you to your appointment, they will need to remain in the main lobby due to limited space in the testing area.    How to prepare for your Myocardial Perfusion Test: Do not eat or drink 3 hours prior to your test, except you may have water. Do not consume products containing caffeine (regular or decaffeinated) 12 hours prior to your test. (ex: coffee, chocolate, sodas, tea). Do bring a list of your current medications with you.  If not listed below, you may take your medications as normal. Do wear comfortable clothes (no dresses or overalls) and walking shoes, tennis shoes  preferred (No heels or open toe shoes are allowed). Do NOT wear cologne, perfume, aftershave, or lotions (deodorant is allowed). If these instructions are not followed, your test will have to be rescheduled.  If you cannot keep your appointment, please provide 24 hours notification to the Nuclear Lab, to avoid a possible $50 charge to your account.         Signed, Werner Lean, MD  08/13/2021 9:46 AM    [place]

## 2021-08-13 ENCOUNTER — Other Ambulatory Visit: Payer: Self-pay

## 2021-08-13 ENCOUNTER — Ambulatory Visit: Payer: BC Managed Care – PPO | Admitting: Internal Medicine

## 2021-08-13 ENCOUNTER — Encounter: Payer: Self-pay | Admitting: Internal Medicine

## 2021-08-13 VITALS — BP 136/78 | HR 53 | Ht 71.0 in | Wt 377.6 lb

## 2021-08-13 DIAGNOSIS — I2510 Atherosclerotic heart disease of native coronary artery without angina pectoris: Secondary | ICD-10-CM

## 2021-08-13 DIAGNOSIS — I4892 Unspecified atrial flutter: Secondary | ICD-10-CM

## 2021-08-13 DIAGNOSIS — Z6841 Body Mass Index (BMI) 40.0 and over, adult: Secondary | ICD-10-CM

## 2021-08-13 DIAGNOSIS — I10 Essential (primary) hypertension: Secondary | ICD-10-CM

## 2021-08-13 DIAGNOSIS — E782 Mixed hyperlipidemia: Secondary | ICD-10-CM | POA: Diagnosis not present

## 2021-08-13 DIAGNOSIS — G4733 Obstructive sleep apnea (adult) (pediatric): Secondary | ICD-10-CM

## 2021-08-13 DIAGNOSIS — Z9989 Dependence on other enabling machines and devices: Secondary | ICD-10-CM

## 2021-08-13 NOTE — Patient Instructions (Addendum)
Medication Instructions:  Your physician has recommended you make the following change in your medication:   STOP: diltiazem (Cardizem) *If you need a refill on your cardiac medications before your next appointment, please call your pharmacy*   Lab Work: TODAY: BNP, CMP If you have labs (blood work) drawn today and your tests are completely normal, you will receive your results only by: [place] (if you have MyChart) OR A paper copy in the mail If you have any lab test that is abnormal or we need to change your treatment, we will call you to review the results.   Testing/Procedures: Your physician has requested that you have a [place]. For further information please visit [email or web address]. Please follow instruction sheet, as given.    Follow-Up: At [facility], you and your health needs are our priority.  As part of our continuing mission to provide you with exceptional heart care, we have created designated Provider Care Teams.  These Care Teams include your primary Cardiologist (physician) and Advanced Practice Providers (APPs -  Physician Assistants and Nurse Practitioners) who all work together to provide you with the care you need, when you need it.  We recommend signing up for the patient portal called "MyChart".  Sign up information is provided on this After Visit Summary.  MyChart is used to connect with patients for Virtual Visits (Telemedicine).  Patients are able to view lab/test results, encounter notes, upcoming appointments, etc.  Non-urgent messages can be sent to your provider as well.   To learn more about what you can do with MyChart, go to [email or web address].    Your next appointment:   6 month(s)  The format for your next appointment:   In Person  Provider:   You may see Rudean Haskell, MD or one of the following Advanced Practice Providers on your designated Care Team:   Melina Copa, PA-C Ermalinda Barrios, PA-C   Other  Instructions  You are scheduled for a Myocardial Perfusion Imaging Study. Please arrive 15 minutes prior to your appointment time for registration and insurance purposes.   The test will take approximately 3 to 4 hours to complete; you may bring reading material.  If someone comes with you to your appointment, they will need to remain in the main lobby due to limited space in the testing area.    How to prepare for your Myocardial Perfusion Test: Do not eat or drink 3 hours prior to your test, except you may have water. Do not consume products containing caffeine (regular or decaffeinated) 12 hours prior to your test. (ex: coffee, chocolate, sodas, tea). Do bring a list of your current medications with you.  If not listed below, you may take your medications as normal. Do wear comfortable clothes (no dresses or overalls) and walking shoes, tennis shoes preferred (No heels or open toe shoes are allowed). Do NOT wear cologne, perfume, aftershave, or lotions (deodorant is allowed). If these instructions are not followed, your test will have to be rescheduled.  If you cannot keep your appointment, please provide 24 hours notification to the Nuclear Lab, to avoid a possible $50 charge to your account.

## 2021-08-14 LAB — COMPREHENSIVE METABOLIC PANEL
ALT: 50 IU/L — ABNORMAL HIGH (ref 0–44)
AST: 42 IU/L — ABNORMAL HIGH (ref 0–40)
Albumin/Globulin Ratio: 1.5 (ref 1.2–2.2)
Albumin: 3.7 g/dL — ABNORMAL LOW (ref 4.0–5.0)
Alkaline Phosphatase: 200 IU/L — ABNORMAL HIGH (ref 44–121)
BUN/Creatinine Ratio: 15 (ref 9–20)
BUN: 27 mg/dL — ABNORMAL HIGH (ref 6–24)
Bilirubin Total: 0.6 mg/dL (ref 0.0–1.2)
CO2: 22 mmol/L (ref 20–29)
Calcium: 9.3 mg/dL (ref 8.7–10.2)
Chloride: 106 mmol/L (ref 96–106)
Creatinine, Ser: 1.8 mg/dL — ABNORMAL HIGH (ref 0.76–1.27)
Globulin, Total: 2.5 g/dL (ref 1.5–4.5)
Glucose: 108 mg/dL — ABNORMAL HIGH (ref 65–99)
Potassium: 4.4 mmol/L (ref 3.5–5.2)
Sodium: 141 mmol/L (ref 134–144)
Total Protein: 6.2 g/dL (ref 6.0–8.5)
eGFR: 46 mL/min/{1.73_m2} — ABNORMAL LOW (ref >59)

## 2021-08-14 LAB — PRO B NATRIURETIC PEPTIDE: NT-Pro BNP: 79 pg/mL (ref 0–121)

## 2021-09-02 ENCOUNTER — Ambulatory Visit: Payer: BC Managed Care – PPO | Admitting: Oncology

## 2021-09-02 ENCOUNTER — Other Ambulatory Visit: Payer: BC Managed Care – PPO

## 2021-09-02 ENCOUNTER — Other Ambulatory Visit: Payer: Self-pay | Admitting: Hematology and Oncology

## 2021-09-02 DIAGNOSIS — D473 Essential (hemorrhagic) thrombocythemia: Secondary | ICD-10-CM

## 2021-09-20 DIAGNOSIS — G4733 Obstructive sleep apnea (adult) (pediatric): Secondary | ICD-10-CM | POA: Diagnosis not present

## 2021-10-01 ENCOUNTER — Telehealth (HOSPITAL_COMMUNITY): Payer: Self-pay

## 2021-10-01 NOTE — Telephone Encounter (Signed)
Spoke with the patient, detailed instructions given. He stated he would be here for his test. Asked to call back with any questions. Sean [name] EMTP 

## 2021-10-03 ENCOUNTER — Other Ambulatory Visit: Payer: Self-pay

## 2021-10-03 ENCOUNTER — Ambulatory Visit (HOSPITAL_COMMUNITY): Payer: BC Managed Care – PPO | Attending: Cardiovascular Disease

## 2021-10-03 DIAGNOSIS — Z9989 Dependence on other enabling machines and devices: Secondary | ICD-10-CM | POA: Diagnosis not present

## 2021-10-03 DIAGNOSIS — I4892 Unspecified atrial flutter: Secondary | ICD-10-CM | POA: Diagnosis not present

## 2021-10-03 DIAGNOSIS — Z6841 Body Mass Index (BMI) 40.0 and over, adult: Secondary | ICD-10-CM | POA: Insufficient documentation

## 2021-10-03 DIAGNOSIS — I2510 Atherosclerotic heart disease of native coronary artery without angina pectoris: Secondary | ICD-10-CM

## 2021-10-03 DIAGNOSIS — E782 Mixed hyperlipidemia: Secondary | ICD-10-CM

## 2021-10-03 DIAGNOSIS — I10 Essential (primary) hypertension: Secondary | ICD-10-CM | POA: Insufficient documentation

## 2021-10-03 DIAGNOSIS — G4733 Obstructive sleep apnea (adult) (pediatric): Secondary | ICD-10-CM | POA: Diagnosis not present

## 2021-10-03 MED ORDER — REGADENOSON 0.4 MG/5ML IV SOLN
0.4000 mg | Freq: Once | INTRAVENOUS | Status: AC
  Administered 2021-10-03: 0.4 mg via INTRAVENOUS

## 2021-10-03 MED ORDER — TECHNETIUM TC 99M TETROFOSMIN IV KIT
31.4000 | PACK | Freq: Once | INTRAVENOUS | Status: AC | PRN
  Administered 2021-10-03: 31.4 via INTRAVENOUS
  Filled 2021-10-03: qty 32

## 2021-10-04 ENCOUNTER — Ambulatory Visit (HOSPITAL_COMMUNITY): Payer: BC Managed Care – PPO | Attending: Cardiovascular Disease

## 2021-10-04 LAB — MYOCARDIAL PERFUSION IMAGING
LV dias vol: 244 mL (ref 62–150)
LV sys vol: 129 mL
Nuc Stress EF: 47 %
Peak HR: 77 {beats}/min
Rest HR: 54 {beats}/min
SDS: 6
SRS: 15
SSS: 24
ST Depression (mm): 0 mm
Stress Nuclear Isotope Dose: 31.4 mCi
TID: 1

## 2021-10-04 MED ORDER — TECHNETIUM TC 99M TETROFOSMIN IV KIT
30.6000 | PACK | Freq: Once | INTRAVENOUS | Status: AC | PRN
  Administered 2021-10-04: 30.6 via INTRAVENOUS
  Filled 2021-10-04: qty 31

## 2021-10-21 DIAGNOSIS — G4733 Obstructive sleep apnea (adult) (pediatric): Secondary | ICD-10-CM | POA: Diagnosis not present

## 2021-11-01 ENCOUNTER — Encounter: Payer: Self-pay | Admitting: Physician Assistant

## 2021-11-01 ENCOUNTER — Ambulatory Visit (INDEPENDENT_AMBULATORY_CARE_PROVIDER_SITE_OTHER): Payer: BC Managed Care – PPO | Admitting: Physician Assistant

## 2021-11-01 VITALS — BP 138/74 | HR 58 | Temp 98.6°F | Ht 71.0 in | Wt 387.4 lb

## 2021-11-01 DIAGNOSIS — M1A09X0 Idiopathic chronic gout, multiple sites, without tophus (tophi): Secondary | ICD-10-CM

## 2021-11-01 DIAGNOSIS — I2510 Atherosclerotic heart disease of native coronary artery without angina pectoris: Secondary | ICD-10-CM

## 2021-11-01 DIAGNOSIS — E039 Hypothyroidism, unspecified: Secondary | ICD-10-CM

## 2021-11-01 DIAGNOSIS — R748 Abnormal levels of other serum enzymes: Secondary | ICD-10-CM | POA: Diagnosis not present

## 2021-11-01 DIAGNOSIS — R112 Nausea with vomiting, unspecified: Secondary | ICD-10-CM

## 2021-11-01 DIAGNOSIS — R1013 Epigastric pain: Secondary | ICD-10-CM

## 2021-11-01 DIAGNOSIS — Z5309 Procedure and treatment not carried out because of other contraindication: Secondary | ICD-10-CM

## 2021-11-01 DIAGNOSIS — T7840XA Allergy, unspecified, initial encounter: Secondary | ICD-10-CM

## 2021-11-01 DIAGNOSIS — N289 Disorder of kidney and ureter, unspecified: Secondary | ICD-10-CM

## 2021-11-01 MED ORDER — FAMOTIDINE 20 MG PO TABS: 20.0000 mg | ORAL_TABLET | Freq: Every day | ORAL | 3 refills | Status: DC

## 2021-11-01 MED ORDER — PANTOPRAZOLE SODIUM 40 MG PO TBEC: 40.0000 mg | DELAYED_RELEASE_TABLET | Freq: Two times a day (BID) | ORAL | 3 refills | Status: DC

## 2021-11-01 NOTE — Patient Instructions (Signed)
It was great to see you!  We are going to refer you to the liver specialists -- expect a phone call   We are requesting records from your hematologist (blood doctor)  Per the gastroenterologist, [name], start Protonix 40 mg twice a day and take Famotidine 20 mg at night for your heartburn I'm sending this in today  Start taking your thyroid medication (levothyroxine) first thing on an empty stomach daily -- at least 30 minutes prior to your other medications  Start thinking about what YOU want to do for weight loss. We will discuss at follow-up.  Let's follow-up in 1 month, sooner if you have concerns.  If a referral was placed today, you will be contacted for an appointment. Please note that routine referrals can sometimes take up to 3-4 weeks to process. Please call our office if you haven't heard anything after this time frame.  Take care,  [name] PA-C

## 2021-11-01 NOTE — Progress Notes (Signed)
Sean [name] is a 50 y.o. male here to establish care.   History of Present Illness:   Chief Complaint  Patient presents with   Establish Care   Hyperlipidemia   Hypertension   Sean [name] presented to his appointment with his Sean [name] today.  Sean [name] is here to establish care.  He has other Sean [name] providers and wants his primary care provider to be through [facility] health.  Chronic Emesis/abdominal pain According to his Sean [name], Sean [name] has been having a lifelong issue of throwing up in the morning. He states that upon waking up he gets up and tends to throw up a lot of phlegm. Occasionally he has a cough along with the vomiting and reports there are times that he will have hematemesis. Sean [name] has been to a gastroenterologist in the past regarding this issue, but upon having an EGD, nothing remarkable was found. Pt has stated that taking nexium did help at one point, but he was unable to continue this medication due to insurance issues. He does have a hx of abnormal liver enzymes.  Most recent note from gastroenterology shows she was increasing his Protonix to 40 mg twice daily, starting famotidine 20 mg at night, and taking 1 g of Carafate 2-4 times daily as needed.  He states that he has not been taking the Protonix or the Pepcid because he never received them.  He has his medications here for me to review and they are not with him today.  Elevated liver labs By Dr. Thornton [name]. Per her notes:  "Thrombocytopenia present since 2015. Abdominal ultrasound 01/2018 showed fatty liver.  Most recent liver panel shows alk phos of 200, AST of 42 and ALT of 50 on 08/13/2021.    He had an EGD and incomplete colonoscopy 04/21/21 to the distal ascending colon.  Pathology results from his EGD and colonoscopy show gastropathy with intestinal metaplasia, hyperplastic gastric polyps, and normal esophageal biopsies. The polyp removed from his colonoscopy was a small tubular adenoma. CT colonography 04/22/21  showed no polyp or mass   Elastography 01/15/21 showed an echogenic liver, Median kPa: 3.7; however, the study was limited in accuracy due to body habitus.   We are requesting his records today from his hematologist that he sees in [name].  Was recommended to have [facility]ro[facility] for further assessment of underlying liver disease --I do not see where this has been done.   HTN Compliant with toprol XL 25 mg and lasix 40 mg daily with no adverse effects. At home blood pressure readings are: not checked. Patient denies chest pain, SOB, blurred vision, dizziness, unusual headaches, lower leg swelling. Denies excessive caffeine intake, stimulant usage, excessive alcohol intake, or increase in salt consumption.  BP Readings from Last 3 Encounters:  11/01/21 138/74  08/13/21 136/78  05/15/21 132/80    Hypothyroidism Mr. Sean [name] is currently compliant with taking synthroid 50 mcg daily with no adverse effects. He has not been taking this medication by itself or on an empty stomach. Pt is now aware of the proper way to take this medication. Denies heart palpitations, cold/heat intolerance, brittle nails, and hair loss.   Hypercholesteremia At this time, pt is compliant with taking Repatha.  It appears he was on Zetia at one point but he is no longer on this medication.  He was taken off of any statins due to abnormal liver function. He is tolerating well.   Allergies At this time, he is taking singulair 10 mg and zyrtec 10 mg daily  with no adverse effects. States he experiences allergic reactions daily such as nasal congestion and itchy eyes. Although he does sleep with a CPAP machine, he has found this to not be beneficial in regards to this issue. Despite everyday use of medications, he is managing well.   Hx of Gout In addition to other medications, pt has been compliant with taking allopurinol 100 mg daily with no adverse effects. Stated that he is not experiencing any pain since starting this  medication, but has not had his uric acid levels recently checked.  Chronic renal insufficiency Per chart review patient has a history of chronic renal insufficiency.  When I discussed this with him he is not unaware of this diagnosis.  Most recent GFR around 46.  Obesity Patient with morbid obesity.  His sister has had success with gastric bypass.  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  Health Maintenance: Immunizations -- Covid- Discontinued Influenza- Postponed until 01/2022 Tdap- UTD; 2020 Colonoscopy -- UTD; 2022 Bone Density -- N/A PSA -- N/A Diet -- Eats all food groups  Sleep habits -- Normal; CPAP used nightly  Exercise -- Not at all recently Weight -- Overweight; has increased 10 pounds in last month Wt Readings from Last 3 Encounters:  11/01/21 (!) 387 lb 6.4 oz (175.7 kg)  10/03/21 (!) 377 lb (171 kg)  08/13/21 (!) 377 lb 9.6 oz (171.3 kg)    Mood -- Stable Alcohol -- 6 pack of beers a month; participated in binge drinking regularly about 20 years ago Tobacco -- Never  Depression screen [facility] 2/9 11/01/2021  Decreased Interest 0  Down, Depressed, Hopeless 0  PHQ - 2 Score 0    No flowsheet data found.   Other providers/specialists: Patient Care Team: Sean [name], [place] as PCP - General (Physician Assistant) Sean Spark, MD (Inactive) as PCP - Cardiology (Cardiology)   Past Medical History:  Diagnosis Date   Abnormal liver enzymes    a. Sees a doctor in [place].   Cardiac cirrhosis    a. possible elevated LFTs/low platelets felt due to cardiac cirrhosis per 2017 admission.   Chronic combined systolic and diastolic CHF (congestive heart failure) (HCC)    CKD (chronic kidney disease), stage II    Congenital heart defect    a. rightward rotation of heart, almost dextrocardia   Coronary artery disease    a. s/p CABGx1 in 12/2015.   Hematuria    a.  Chronic hx of this, no prior etiology determined through workup per patient.   Hypercholesteremia    a. Prev taken off statin due to abnormal liver function.   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   LIMA to LAD   Sinus bradycardia    Thrombocytopenia (HCC)      Social History   Tobacco Use   Smoking status: Never   Smokeless tobacco: Never  Vaping Use   Vaping Use: Never used  Substance Use Topics   Alcohol use: Yes    Comment: 6 beers per month   Drug use: No    Past Surgical History:  Procedure Laterality Date   APPENDECTOMY     BIOPSY  02/19/2021   Procedure: BIOPSY;  Surgeon: Sean Park, MD;  Location: [facility];  Service: Gastroenterology;;   CARDIAC CATHETERIZATION N/A  12/24/2015   Procedure: Right/Left Heart Cath and Coronary Angiography;  Surgeon: Jolaine Artist, MD;  Location: [place] [facility];  Service: Cardiovascular;  Laterality: N/A;   CARDIOVERSION N/A 02/14/2016   Procedure: CARDIOVERSION;  Surgeon: Pixie Casino, MD;  Location: [facility] ENDOSCOPY;  Service: Cardiovascular;  Laterality: N/A;   COLONOSCOPY WITH PROPOFOL N/A 02/19/2021   Procedure: COLONOSCOPY WITH PROPOFOL;  Surgeon: Sean Park, MD;  [facility]: [facility];  Service: Gastroenterology;  Laterality: N/A;   CORONARY ARTERY BYPASS GRAFT N/A 12/26/2015   Procedure: Off Pump Coronary Artery Bypass Grafting times one using left internal mammary artery;  Surgeon: Rexene Alberts, MD;  Location: [place];  Service: Open Heart Surgery;  Laterality: N/A;   ESOPHAGOGASTRODUODENOSCOPY (EGD) WITH PROPOFOL N/A 02/19/2021   Procedure: ESOPHAGOGASTRODUODENOSCOPY (EGD) WITH PROPOFOL;  Surgeon: Sean Park, MD;  [facility];  Service: Gastroenterology;  Laterality: N/A;   GALLBLADDER SURGERY     HERNIA REPAIR     LEFT HEART CATH AND CORS/GRAFTS ANGIOGRAPHY N/A 04/15/2017   Procedure: Left  Heart Cath and Cors/Grafts Angiography;  Surgeon: Troy Sine, MD;  Location: [place] [facility];  Service: Cardiovascular;  Laterality: N/A;   LEFT HEART CATH AND CORS/GRAFTS ANGIOGRAPHY N/A 11/02/2020   Procedure: LEFT HEART CATH AND CORS/GRAFTS ANGIOGRAPHY;  Surgeon: Sherren Mocha, MD;  Location: [place] [facility];  Service: Cardiovascular;  Laterality: N/A;   POLYPECTOMY  02/19/2021   Procedure: POLYPECTOMY;  Surgeon: Sean Park, MD;  [facility];  Service: Gastroenterology;;   TEE WITHOUT CARDIOVERSION N/A 12/26/2015   Procedure: TRANSESOPHAGEAL ECHOCARDIOGRAM (TEE);  Surgeon: Rexene Alberts, MD;  Location: [place];  Service: Open Heart Surgery;  Laterality: N/A;   TEE WITHOUT CARDIOVERSION N/A 02/14/2016   Procedure: TRANSESOPHAGEAL ECHOCARDIOGRAM (TEE);  Surgeon: Pixie Casino, MD;  Location: [facility] ENDOSCOPY;  Service: Cardiovascular;  Laterality: N/A;    Family History  Problem Relation Age of Onset   CAD Father        2 stents ~ 54   Diabetes Father    Heart attack Other    Hyperlipidemia Other    Diabetes Maternal Grandfather    Diabetes Maternal Uncle     Allergies  Allergen Reactions   Imdur [Isosorbide Dinitrate] Diarrhea    Pt states causes "diarrhea."   Augmentin [Amoxicillin-Pot Clavulanate] Itching   Tape Rash     Current Medications:   Current Outpatient Medications:    acetaminophen (TYLENOL) 500 MG tablet, Take 500-1,500 mg by mouth at bedtime., Disp: , Rfl:    allopurinol (ZYLOPRIM) 100 MG tablet, Take 1 tablet by mouth daily., Disp: , Rfl:    cetirizine (ZYRTEC) 10 MG tablet, Take 10 mg by mouth daily., Disp: , Rfl:    Cyanocobalamin (VITAMIN B-12) 5000 MCG SUBL, Place 5,000 mcg under the tongue daily., Disp: , Rfl:    ELIQUIS 5 MG TABS tablet, TAKE 1 TABLET BY MOUTH TWICE DAILY., Disp: 60 tablet, Rfl: 5   levothyroxine (SYNTHROID) 50 MCG tablet, TAKE 1 TABLET(50 MCG) BY MOUTH DAILY, Disp: , Rfl:    metoprolol succinate (TOPROL-XL) 25  MG 24 hr tablet, Take 25 mg by mouth daily., Disp: , Rfl:    montelukast (SINGULAIR) 10 MG tablet, Take 10 mg by mouth daily., Disp: , Rfl:    nitroGLYCERIN (NITROSTAT) 0.4 MG SL tablet, Place 1 tablet (0.4 mg total) under the tongue every 5 (five) minutes as needed for chest pain., Disp: 25 tablet, Rfl: 12   potassium chloride (K-DUR,KLOR-CON) 10 MEQ  tablet, TAKE 1 TABLET ONCE DAILY., Disp: 30 tablet, Rfl: 8   REPATHA SURECLICK 364 MG/ML SOAJ, INJECT THE CONTENTS OF 1 SYRINGE UNDER THE SKIN EVERY 14 DAYS, Disp: 2 pen, Rfl: 11   famotidine (PEPCID) 20 MG tablet, Take 1 tablet (20 mg total) by mouth at bedtime. (Patient not taking: Reported on 11/01/2021), Disp: 90 tablet, Rfl: 3   furosemide (LASIX) 40 MG tablet, Take 40 mg by mouth daily as needed for fluid. (Patient not taking: Reported on 11/01/2021), Disp: , Rfl:    pantoprazole (PROTONIX) 40 MG tablet, Take 1 tablet (40 mg total) by mouth 2 (two) times daily. (Patient not taking: Reported on 11/01/2021), Disp: 180 tablet, Rfl: 3   sucralfate (CARAFATE) 1 g tablet, Dissolve 1 tablet in a small amount of water to make a slurry and take 2-4 times daily (Patient not taking: Reported on 11/01/2021), Disp: 120 tablet, Rfl: 2   Review of Systems:   ROS Negative unless otherwise specified per HPI. Vitals:   Vitals:   11/01/21 1432  BP: 138/74  Pulse: (!) 58  Temp: 98.6 F (37 C)  TempSrc: Temporal  SpO2: 97%  Weight: (!) 387 lb 6.4 oz (175.7 kg)  Height: 5' 11" (1.803 m)      Body mass index is 54.03 kg/m.  Physical Exam:   Physical Exam Vitals and nursing note reviewed.  Constitutional:      General: He is not in acute distress.    Appearance: He is well-developed. He is not ill-appearing or toxic-appearing.  Cardiovascular:     Rate and Rhythm: Normal rate and regular rhythm.     Pulses: Normal pulses.     Heart sounds: Normal heart sounds, S1 normal and S2 normal.  Pulmonary:     Effort: Pulmonary effort is normal.     Breath  sounds: Normal breath sounds.  Skin:    General: Skin is warm and dry.  Neurological:     Mental Status: He is alert.     GCS: GCS eye subscore is 4. GCS verbal subscore is 5. GCS motor subscore is 6.  Psychiatric:        Speech: Speech normal.        Behavior: Behavior normal. Behavior is cooperative.   Assessment and Plan:   Nausea and vomiting, unspecified vomiting type; Abdominal pain, epigastric Uncontrolled Reviewed [name] most recent recommendations I have represcribed her medications of Protonix 40 mg twice daily and famotidine 40 mg nightly I encourage compliance with this Follow-up in 1 month  Elevated liver enzymes Reviewed history from [name] We are going to refer him to hepatology for further evaluation and management  Acquired hypothyroidism Discussed importance of taking levothyroxine 50 mcg daily on an empty stomach We will recheck his TSH in about a month when he comes back for follow-up  Allergy, initial encounter Overall well controlled on Singulair 10 mg daily and Zyrtec 10 mg daily Continue to monitor  Chronic gout of multiple sites, unspecified cause Overall well controlled on allopurinol 100 mg daily Will update uric acid levels at next visit  Renal insufficiency Update blood work at next visit Discussed importance of maintaining kidney function and to prevent worsening Continue to monitor, avoid NSAIDs  Statins contraindicated Unable to take statins due to liver function  Coronary artery disease involving native coronary artery of native heart without angina pectoris Management per cardiology  Morbid obesity ([place]) Briefly discussed Follow-up in 1 month to discuss I think he be a good candidate  for bariatric surgery if he was permitted to do so from cardiology standpoint Also consider GLP-1    I,Havlyn C Ratchford,acting as a scribe for Sean Coke, PA.,have documented all relevant documentation on the behalf of Sean Coke, PA,as directed by  Sean Coke, PA while in the presence of Sean [name], [place].  I, Sean [name], [place], have reviewed all documentation for this visit. The documentation on 11/01/21 for the exam, diagnosis, procedures, and orders are all accurate and complete.  Time spent with patient today was 55 minutes which consisted of chart review, discussing diagnosis, work up, treatment answering questions and documentation.   Sean Coke, PA-C

## 2021-11-06 DIAGNOSIS — J019 Acute sinusitis, unspecified: Secondary | ICD-10-CM | POA: Diagnosis not present

## 2021-11-15 ENCOUNTER — Other Ambulatory Visit: Payer: Self-pay

## 2021-11-15 ENCOUNTER — Ambulatory Visit (INDEPENDENT_AMBULATORY_CARE_PROVIDER_SITE_OTHER): Payer: BC Managed Care – PPO | Admitting: Physician Assistant

## 2021-11-15 ENCOUNTER — Encounter: Payer: Self-pay | Admitting: Physician Assistant

## 2021-11-15 VITALS — BP 118/70 | HR 57 | Temp 98.2°F | Ht 71.0 in | Wt 385.0 lb

## 2021-11-15 DIAGNOSIS — R051 Acute cough: Secondary | ICD-10-CM | POA: Diagnosis not present

## 2021-11-15 DIAGNOSIS — R1013 Epigastric pain: Secondary | ICD-10-CM

## 2021-11-15 LAB — POC COVID19 BINAXNOW: SARS Coronavirus 2 Ag: NEGATIVE

## 2021-11-15 MED ORDER — FAMOTIDINE 20 MG PO TABS: 20.0000 mg | ORAL_TABLET | Freq: Every day | ORAL | 3 refills | Status: DC

## 2021-11-15 MED ORDER — AZITHROMYCIN 250 MG PO TABS: ORAL_TABLET | ORAL | 0 refills | Status: DC

## 2021-11-15 MED ORDER — AZITHROMYCIN 250 MG PO TABS: ORAL_TABLET | ORAL | 0 refills | Status: AC

## 2021-11-15 NOTE — Patient Instructions (Signed)
It was great to see you!  Start z-pack and pepcid  If any worsening chest pain or shortness of breath -- please call your cardiologist or go to the ER  Push fluids and get plenty of rest. Please return if you are not improving as expected, or if you have high fevers (>101.5) or difficulty swallowing or worsening productive cough.  Call clinic with questions.  I hope you start feeling better soon!

## 2021-11-15 NOTE — Progress Notes (Signed)
Sean [name] is a 50 y.o. male here for a possible sinus infection.  History of Present Illness:   Chief Complaint  Patient presents with   Sinus Problem    Pt says started 2 weeks ago, headaches, sinus pressure, coughing and expectorating green early in the morning. Was seen at Urgent Care in [facility] last week. Prescribed Cipro and Prednisone. Pt said he stopped Cipro due to chest tightness, SOB and dizziness symptoms subsided after stopping medication.    HPI  Sinus Pressure [name] presents with c/o sinus pressure that has been onset for two weeks. In addition he has been experiencing headaches, eye pain, coughing, and expectorating green sputum. Initially [name] visited the [facility] in [place] last week with current sx and was prescribed doxycycline and prednisone. Upon taking three days worth of doxycycline before experiencing chest tightness, SOB, and dizziness. He has continued and completed prednisone 20 mg daily for 5 days. Denies fever or chills.   In regards to his stress test -- he had no obvious ischemia on stress test on 10/04/21.  Epigastric Pain  [name] is also experiencing epigastric pain that he believes is indigestion. Currently he is taking carafate 1g 4 times daily and protonix 40 mg daily with no adverse effects. Mr. Denn states he still hasn't been able to start pepcid 20 mg daily due to pharmacy not seeing the order for the medication.   Past Medical History:  Diagnosis Date   Abnormal liver enzymes    a. Sees a doctor in [place].   Cardiac cirrhosis    a. possible elevated LFTs/low platelets felt due to cardiac cirrhosis per 2017 admission.   Chronic combined systolic and diastolic CHF (congestive heart failure) (HCC)    CKD (chronic kidney disease), stage II    Congenital heart defect    a. rightward rotation of heart, almost dextrocardia   Coronary artery disease    a. s/p CABGx1 in 12/2015.   Hematuria    a. Chronic hx of this, no prior etiology  determined through workup per patient.   Hypercholesteremia    a. Prev taken off statin due to abnormal liver function.   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   LIMA to LAD   Sinus bradycardia    Thrombocytopenia (HCC)      Social History   Tobacco Use   Smoking status: Never   Smokeless tobacco: Never  Vaping Use   Vaping Use: Never used  Substance Use Topics   Alcohol use: Yes    Comment: 6 beers per month   Drug use: No    Past Surgical History:  Procedure Laterality Date   APPENDECTOMY     BIOPSY  02/19/2021   Procedure: BIOPSY;  Surgeon: Thornton Park, MD;  [facility];  Service: Gastroenterology;;   CARDIAC CATHETERIZATION N/A 12/24/2015   Procedure: Right/Left Heart Cath and Coronary Angiography;  Surgeon: Jolaine Artist, MD;  Location: [place] [facility];  Service: Cardiovascular;  Laterality: N/A;   CARDIOVERSION N/A 02/14/2016   Procedure: CARDIOVERSION;  Surgeon: Pixie Casino, MD;  Location: [facility] ENDOSCOPY;  Service: Cardiovascular;  Laterality: N/A;   COLONOSCOPY WITH PROPOFOL N/A 02/19/2021   Procedure: COLONOSCOPY WITH PROPOFOL;  Surgeon: Thornton Park, MD;  [facility];  Service: Gastroenterology;  Laterality: N/A;   CORONARY ARTERY BYPASS GRAFT N/A 12/26/2015   Procedure: Off  Pump Coronary Artery Bypass Grafting times one using left internal mammary artery;  Surgeon: Rexene Alberts, MD;  Location: [place];  Service: Open Heart Surgery;  Laterality: N/A;   ESOPHAGOGASTRODUODENOSCOPY (EGD) WITH PROPOFOL N/A 02/19/2021   Procedure: ESOPHAGOGASTRODUODENOSCOPY (EGD) WITH PROPOFOL;  Surgeon: Thornton Park, MD;  [facility];  Service: Gastroenterology;  Laterality: N/A;   GALLBLADDER SURGERY     HERNIA REPAIR     LEFT HEART CATH AND CORS/GRAFTS ANGIOGRAPHY N/A 04/15/2017   Procedure: Left Heart Cath and Cors/Grafts Angiography;   Surgeon: Troy Sine, MD;  Location: [place] [facility];  Service: Cardiovascular;  Laterality: N/A;   LEFT HEART CATH AND CORS/GRAFTS ANGIOGRAPHY N/A 11/02/2020   Procedure: LEFT HEART CATH AND CORS/GRAFTS ANGIOGRAPHY;  Surgeon: Sherren Mocha, MD;  Location: [place] [facility];  Service: Cardiovascular;  Laterality: N/A;   POLYPECTOMY  02/19/2021   Procedure: POLYPECTOMY;  Surgeon: Thornton Park, MD;  [facility];  Service: Gastroenterology;;   TEE WITHOUT CARDIOVERSION N/A 12/26/2015   Procedure: TRANSESOPHAGEAL ECHOCARDIOGRAM (TEE);  Surgeon: Rexene Alberts, MD;  Location: [place];  Service: Open Heart Surgery;  Laterality: N/A;   TEE WITHOUT CARDIOVERSION N/A 02/14/2016   Procedure: TRANSESOPHAGEAL ECHOCARDIOGRAM (TEE);  Surgeon: Pixie Casino, MD;  Location: [facility] ENDOSCOPY;  Service: Cardiovascular;  Laterality: N/A;    Family History  Problem Relation Age of Onset   CAD Father        2 stents ~ 54   Diabetes Father    Heart attack Other    Hyperlipidemia Other    Diabetes Maternal Grandfather    Diabetes Maternal Uncle     Allergies  Allergen Reactions   Doxycycline Shortness Of Breath   Imdur [Isosorbide Dinitrate] Diarrhea    Pt states causes "diarrhea."   Augmentin [Amoxicillin-Pot Clavulanate] Itching   Tape Rash    Current Medications:   Current Outpatient Medications:    acetaminophen (TYLENOL) 500 MG tablet, Take 500-1,500 mg by mouth at bedtime., Disp: , Rfl:    allopurinol (ZYLOPRIM) 100 MG tablet, Take 1 tablet by mouth daily., Disp: , Rfl:    cetirizine (ZYRTEC) 10 MG tablet, Take 10 mg by mouth daily., Disp: , Rfl:    Cyanocobalamin (VITAMIN B-12) 5000 MCG SUBL, Place 5,000 mcg under the tongue daily., Disp: , Rfl:    ELIQUIS 5 MG TABS tablet, TAKE 1 TABLET BY MOUTH TWICE DAILY., Disp: 60 tablet, Rfl: 5   famotidine (PEPCID) 20 MG tablet, Take 1 tablet (20 mg total) by mouth at bedtime., Disp: 90 tablet, Rfl: 3   furosemide (LASIX) 40 MG  tablet, Take 40 mg by mouth daily as needed for fluid., Disp: , Rfl:    levothyroxine (SYNTHROID) 50 MCG tablet, TAKE 1 TABLET(50 MCG) BY MOUTH DAILY, Disp: , Rfl:    metoprolol succinate (TOPROL-XL) 25 MG 24 hr tablet, Take 25 mg by mouth daily., Disp: , Rfl:    montelukast (SINGULAIR) 10 MG tablet, Take 10 mg by mouth daily., Disp: , Rfl:    nitroGLYCERIN (NITROSTAT) 0.4 MG SL tablet, Place 1 tablet (0.4 mg total) under the tongue every 5 (five) minutes as needed for chest pain., Disp: 25 tablet, Rfl: 12   pantoprazole (PROTONIX) 40 MG tablet, Take 1 tablet (40 mg total) by mouth 2 (two) times daily., Disp: 180 tablet, Rfl: 3   potassium chloride (K-DUR,KLOR-CON) 10 MEQ tablet, TAKE 1 TABLET ONCE DAILY., Disp: 30 tablet, Rfl: 8   REPATHA SURECLICK 161 MG/ML SOAJ, INJECT THE CONTENTS  OF 1 SYRINGE UNDER THE SKIN EVERY 14 DAYS, Disp: 2 pen, Rfl: 11   sucralfate (CARAFATE) 1 g tablet, Dissolve 1 tablet in a small amount of water to make a slurry and take 2-4 times daily, Disp: 120 tablet, Rfl: 2   Review of Systems:   ROS Negative unless otherwise specified per HPI.  Vitals:   Vitals:   11/15/21 1459  BP: 118/70  Pulse: (!) 57  Temp: 98.2 F (36.8 C)  TempSrc: Temporal  SpO2: 97%  Weight: (!) 385 lb (174.6 kg)  Height: 5\' 11"  (1.803 m)     Body mass index is 53.7 kg/m.  Physical Exam:   Physical Exam Vitals and nursing note reviewed.  Constitutional:      General: He is not in acute distress.    Appearance: He is well-developed. He is not ill-appearing or toxic-appearing.  Cardiovascular:     Rate and Rhythm: Normal rate and regular rhythm.     Pulses: Normal pulses.     Heart sounds: Normal heart sounds, S1 normal and S2 normal.  Pulmonary:     Effort: Pulmonary effort is normal.     Breath sounds: Normal breath sounds.  Skin:    General: Skin is warm and dry.  Neurological:     Mental Status: He is alert.     GCS: GCS eye subscore is 4. GCS verbal subscore is 5. GCS  motor subscore is 6.  Psychiatric:        Speech: Speech normal.        Behavior: Behavior normal. Behavior is cooperative.    Assessment and Plan:   Cough, Acute Uncontrolled, no red flags on exam Suspect ongoing sinusitis Start azithromycin 250 mg daily, two tablets on day one, then 1 tablet on days 2-5 Follow up with cardiology if any worsening chest pain or shortness of breath occurs -- no red flags on exam, denies exertional component to chest tightness and SOB Push more fluids and get rest Follow up if no improvement or worsening of symptoms occurs   Abdominal pain, epigastric Uncontrolled Start pepcid 20 mg daily  Continue Carafate 1 g four times daily and protonix 40 mg daily  Encouraged patient to lower intake of spicy and high sodium containing foods    I,Havlyn C Ratchford,acting as a Education administrator for [employer], PA.,have documented all relevant documentation on the behalf of [name], PA,as directed by  [name], PA while in the presence of [name], [place].  I, [name], [place], have reviewed all documentation for this visit. The documentation on 11/15/21 for the exam, diagnosis, procedures, and orders are all accurate and complete.   [name], PA-C

## 2021-11-20 DIAGNOSIS — G4733 Obstructive sleep apnea (adult) (pediatric): Secondary | ICD-10-CM | POA: Diagnosis not present

## 2021-11-21 ENCOUNTER — Telehealth: Payer: Self-pay | Admitting: Physician Assistant

## 2021-11-21 MED ORDER — BENZONATATE 100 MG PO CAPS: 100.0000 mg | ORAL_CAPSULE | Freq: Two times a day (BID) | ORAL | 0 refills | Status: DC | PRN

## 2021-11-21 NOTE — Telephone Encounter (Signed)
Please see message and advise 

## 2021-11-21 NOTE — Telephone Encounter (Signed)
pt was seen on 12/16 for possible sinus infection. pt is coughing uncontrollably and would like meds prescribed. please advise.

## 2021-11-22 NOTE — Telephone Encounter (Signed)
Pt notified and voiced understanding 

## 2021-12-06 ENCOUNTER — Ambulatory Visit (INDEPENDENT_AMBULATORY_CARE_PROVIDER_SITE_OTHER): Payer: BC Managed Care – PPO | Admitting: Physician Assistant

## 2021-12-06 ENCOUNTER — Encounter: Payer: Self-pay | Admitting: Physician Assistant

## 2021-12-06 ENCOUNTER — Ambulatory Visit (INDEPENDENT_AMBULATORY_CARE_PROVIDER_SITE_OTHER): Payer: BC Managed Care – PPO

## 2021-12-06 ENCOUNTER — Other Ambulatory Visit: Payer: Self-pay

## 2021-12-06 VITALS — BP 130/70 | HR 66 | Temp 98.0°F | Ht 71.0 in | Wt 383.4 lb

## 2021-12-06 DIAGNOSIS — E039 Hypothyroidism, unspecified: Secondary | ICD-10-CM

## 2021-12-06 DIAGNOSIS — R748 Abnormal levels of other serum enzymes: Secondary | ICD-10-CM

## 2021-12-06 DIAGNOSIS — R42 Dizziness and giddiness: Secondary | ICD-10-CM

## 2021-12-06 DIAGNOSIS — R1013 Epigastric pain: Secondary | ICD-10-CM

## 2021-12-06 NOTE — Patient Instructions (Signed)
It was great to see you!  We will update your blood work today  I am resubmitting your liver specialist referral -- this is important  We are putting in orders for a heart monitor for you, the cardiology office will call you to order and discuss how to proceed with this  IF YOU HAVE ANY WORSENING SYMPTOMS --> GO TO THE ER  Let's follow-up in 3 months, sooner if you have concerns.  Take care,  [name] PA-C

## 2021-12-06 NOTE — Progress Notes (Unsigned)
Enrolled patient for a 7 day Zio XT monitor to be mailed to patients home   Sean [name] to read

## 2021-12-06 NOTE — Progress Notes (Signed)
Sean [name] is a 51 y.o. male here for a follow up of epigastric pain.   History of Present Illness:   Chief Complaint  Patient presents with   Abdominal Pain    Pt f/u on epigastric pain, say medicine is helping a little bit.    HPI  Epigastric Pain Currently Sean [name] is compliant with taking carafate 1 g 2-4 times daily, protonix 40 mg daily, and pepcid 20 mg daily with no adverse effects. Reports that the medication has been a little helpful depending on the day. Although this is the case, he does believe that what he is eating could also be causing reoccurrence of sx.   Elevated Liver Enzymes  Since our previous visit on 11/01/21, Sean [name] is unaware if he has been contacted by hepatology due to spam callers. At this time he is interested in following up with hepatology if deemed necessary.   Episodic lightheadedness Lately, Sean [name] noticed he has been experiencing episodes of what he could describe as A-fibrillation. Pt describes episodes as a tightness in his chest with associated sx of occasional nausea, dizziness, and SOB. Mr. Sean [name] says these episodes don't last long, maybe a couple of second to a minute. Currently states this feeling has occurred three times this week. According to pt, he was set to complete a heart monitor study but due to an upcoming stress test, he was told to hold off. Px does have a hx of paroxysmal atrial flutter. Denies active chest pain.  Hypothyroidism Sean [name] is compliant with taking synthroid 50 mg daily with no adverse effects. States he has been taking it prior to breakfast but doesn't always take it with water. He is not sure whether current episodes of possible atrial fibrillation are caused by this or something else, but would like to have this rechecked.   Past Medical History:  Diagnosis Date   Abnormal liver enzymes    a. Sees a doctor in [place].   Cardiac cirrhosis    a. possible elevated LFTs/low platelets felt due to cardiac cirrhosis  per 2017 admission.   Chronic combined systolic and diastolic CHF (congestive heart failure) (HCC)    CKD (chronic kidney disease), stage II    Congenital heart defect    a. rightward rotation of heart, almost dextrocardia   Coronary artery disease    a. s/p CABGx1 in 12/2015.   Hematuria    a. Chronic hx of this, no prior etiology determined through workup per patient.   Hypercholesteremia    a. Prev taken off statin due to abnormal liver function.   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   LIMA to LAD   Sinus bradycardia    Thrombocytopenia (HCC)      Social History   Tobacco Use   Smoking status: Never   Smokeless tobacco: Never  Vaping Use   Vaping Use: Never used  Substance Use Topics   Alcohol use: Yes    Comment: 6 beers per month   Drug use: No    Past Surgical History:  Procedure Laterality Date   APPENDECTOMY     BIOPSY  02/19/2021   Procedure: BIOPSY;  Surgeon: Thornton Park, MD;  Location: [facility];  Service: Gastroenterology;;   CARDIAC CATHETERIZATION N/A 12/24/2015   Procedure: Right/Left Heart Cath and Coronary Angiography;  Surgeon: Jolaine Artist, MD;  Location: [place] [facility];  Service: Cardiovascular;  Laterality: N/A;   CARDIOVERSION N/A 02/14/2016   Procedure: CARDIOVERSION;  Surgeon: Pixie Casino, MD;  Location: [facility] ENDOSCOPY;  Service: Cardiovascular;  Laterality: N/A;   COLONOSCOPY WITH PROPOFOL N/A 02/19/2021   Procedure: COLONOSCOPY WITH PROPOFOL;  Surgeon: Thornton Park, MD;  Location: [facility];  Service: Gastroenterology;  Laterality: N/A;   CORONARY ARTERY BYPASS GRAFT N/A 12/26/2015   Procedure: Off Pump Coronary Artery Bypass Grafting times one using left internal mammary artery;  Surgeon: Rexene Alberts, MD;  Location: [place];  Service: Open Heart Surgery;  Laterality: N/A;   ESOPHAGOGASTRODUODENOSCOPY (EGD) WITH PROPOFOL N/A  02/19/2021   Procedure: ESOPHAGOGASTRODUODENOSCOPY (EGD) WITH PROPOFOL;  Surgeon: Thornton Park, MD;  Location: [facility];  Service: Gastroenterology;  Laterality: N/A;   GALLBLADDER SURGERY     HERNIA REPAIR     LEFT HEART CATH AND CORS/GRAFTS ANGIOGRAPHY N/A 04/15/2017   Procedure: Left Heart Cath and Cors/Grafts Angiography;  Surgeon: Troy Sine, MD;  Location: [place] [facility];  Service: Cardiovascular;  Laterality: N/A;   LEFT HEART CATH AND CORS/GRAFTS ANGIOGRAPHY N/A 11/02/2020   Procedure: LEFT HEART CATH AND CORS/GRAFTS ANGIOGRAPHY;  Surgeon: Sherren Mocha, MD;  Location: [place] [facility];  Service: Cardiovascular;  Laterality: N/A;   POLYPECTOMY  02/19/2021   Procedure: POLYPECTOMY;  Surgeon: Thornton Park, MD;  Location: [facility];  Service: Gastroenterology;;   TEE WITHOUT CARDIOVERSION N/A 12/26/2015   Procedure: TRANSESOPHAGEAL ECHOCARDIOGRAM (TEE);  Surgeon: Rexene Alberts, MD;  Location: [place];  Service: Open Heart Surgery;  Laterality: N/A;   TEE WITHOUT CARDIOVERSION N/A 02/14/2016   Procedure: TRANSESOPHAGEAL ECHOCARDIOGRAM (TEE);  Surgeon: Pixie Casino, MD;  Location: [facility];  Service: Cardiovascular;  Laterality: N/A;    Family History  Problem Relation Age of Onset   CAD Father        2 stents ~ 54   Diabetes Father    Heart attack Other    Hyperlipidemia Other    Diabetes Maternal Grandfather    Diabetes Maternal Uncle     Allergies  Allergen Reactions   Doxycycline Shortness Of Breath   Imdur [Isosorbide Dinitrate] Diarrhea    Pt states causes "diarrhea."   Augmentin [Amoxicillin-Pot Clavulanate] Itching   Tape Rash    Current Medications:   Current Outpatient Medications:    acetaminophen (TYLENOL) 500 MG tablet, Take 500-1,500 mg by mouth at bedtime., Disp: , Rfl:    allopurinol (ZYLOPRIM) 100 MG tablet, Take 1 tablet by mouth daily., Disp: , Rfl:    benzonatate (TESSALON) 100 MG capsule, Take 1 capsule (100 mg total) by  mouth 2 (two) times daily as needed for cough., Disp: 20 capsule, Rfl: 0   cetirizine (ZYRTEC) 10 MG tablet, Take 10 mg by mouth daily., Disp: , Rfl:    Cyanocobalamin (VITAMIN B-12) 5000 MCG SUBL, Place 5,000 mcg under the tongue daily., Disp: , Rfl:    ELIQUIS 5 MG TABS tablet, TAKE 1 TABLET BY MOUTH TWICE DAILY., Disp: 60 tablet, Rfl: 5   famotidine (PEPCID) 20 MG tablet, Take 1 tablet (20 mg total) by mouth at bedtime., Disp: 90 tablet, Rfl: 3   furosemide (LASIX) 40 MG tablet, Take 40 mg by mouth daily as needed for fluid., Disp: , Rfl:    levothyroxine (SYNTHROID) 50 MCG tablet, TAKE 1 TABLET(50 MCG) BY MOUTH DAILY, Disp: , Rfl:    metoprolol succinate (TOPROL-XL) 25 MG 24 hr tablet, Take 25 mg by mouth daily., Disp: , Rfl:  montelukast (SINGULAIR) 10 MG tablet, Take 10 mg by mouth daily., Disp: , Rfl:    nitroGLYCERIN (NITROSTAT) 0.4 MG SL tablet, Place 1 tablet (0.4 mg total) under the tongue every 5 (five) minutes as needed for chest pain., Disp: 25 tablet, Rfl: 12   pantoprazole (PROTONIX) 40 MG tablet, Take 1 tablet (40 mg total) by mouth 2 (two) times daily., Disp: 180 tablet, Rfl: 3   potassium chloride (K-DUR,KLOR-CON) 10 MEQ tablet, TAKE 1 TABLET ONCE DAILY., Disp: 30 tablet, Rfl: 8   REPATHA SURECLICK 250 MG/ML SOAJ, INJECT THE CONTENTS OF 1 SYRINGE UNDER THE SKIN EVERY 14 DAYS, Disp: 2 pen, Rfl: 11   sucralfate (CARAFATE) 1 g tablet, Dissolve 1 tablet in a small amount of water to make a slurry and take 2-4 times daily, Disp: 120 tablet, Rfl: 2   Review of Systems:   ROS Negative unless otherwise specified per HPI. Vitals:   Vitals:   12/06/21 1504  BP: 130/70  Pulse: 66  Temp: 98 F (36.7 C)  TempSrc: Temporal  SpO2: 96%  Weight: (!) 383 lb 6.1 oz (173.9 kg)  Height: 5\' 11"  (1.803 m)     Body mass index is 53.47 kg/m.  Physical Exam:   Physical Exam Vitals and nursing note reviewed.  Constitutional:      General: He is not in acute distress.    Appearance:  He is well-developed. He is not ill-appearing or toxic-appearing.  Cardiovascular:     Rate and Rhythm: Normal rate and regular rhythm.     Pulses: Normal pulses.     Heart sounds: Normal heart sounds, S1 normal and S2 normal.  Pulmonary:     Effort: Pulmonary effort is normal.     Breath sounds: Normal breath sounds.  Skin:    General: Skin is warm and dry.  Neurological:     Mental Status: He is alert.     GCS: GCS eye subscore is 4. GCS verbal subscore is 5. GCS motor subscore is 6.  Psychiatric:        Speech: Speech normal.        Behavior: Behavior normal. Behavior is cooperative.    Assessment and Plan:   Abdominal pain, epigastric Continue carafate 1 g four times daily, Pepcid 20 mg daily, and protonix 40 mg daily  Will refer back to gastroenterology for further evaluation if does not further improve  Acquired hypothyroidism Continue synthroid 50 mcg daily  Update labs today, adjust medication as indicated by results  - TSH  Elevated liver enzymes Update labs today, make recommendations accordingly  Will resubmit referral to Hepatology for further evaluation   Episodic lightheadedness Unclear etiology, possibly related to known atrial flutter Ordered heart monitor for further evaluation, patient will be contacted by Cardiology for further instructions Informed patient that if any worsening symptoms occur, to visit the ER immediately Update blood work today as well  Nehemiah Massed C Ratchford,acting as a Education administrator for [employer], PA.,have documented all relevant documentation on the behalf of [name], PA,as directed by  [name], PA while in the presence of [name], [place].  I, [name], [place], have reviewed all documentation for this visit. The documentation on 12/06/21 for the exam, diagnosis, procedures, and orders are all accurate and complete.   [name], PA-C

## 2021-12-07 ENCOUNTER — Other Ambulatory Visit: Payer: Self-pay | Admitting: Physician Assistant

## 2021-12-07 DIAGNOSIS — R7989 Other specified abnormal findings of blood chemistry: Secondary | ICD-10-CM

## 2021-12-07 LAB — CBC WITH DIFFERENTIAL/PLATELET
Absolute Monocytes: 683 cells/uL (ref 200–950)
Basophils Absolute: 39 cells/uL (ref 0–200)
Basophils Relative: 0.7 %
Eosinophils Absolute: 280 cells/uL (ref 15–500)
Eosinophils Relative: 5 %
HCT: 38.6 % (ref 38.5–50.0)
Hemoglobin: 13.3 g/dL (ref 13.2–17.1)
Lymphs Abs: 1450 cells/uL (ref 850–3900)
MCH: 33.6 pg — ABNORMAL HIGH (ref 27.0–33.0)
MCHC: 34.5 g/dL (ref 32.0–36.0)
MCV: 97.5 fL (ref 80.0–100.0)
MPV: 9.9 fL (ref 7.5–12.5)
Monocytes Relative: 12.2 %
Neutro Abs: 3147 cells/uL (ref 1500–7800)
Neutrophils Relative %: 56.2 %
Platelets: 66 10*3/uL — ABNORMAL LOW (ref 140–400)
RBC: 3.96 10*6/uL — ABNORMAL LOW (ref 4.20–5.80)
RDW: 12.8 % (ref 11.0–15.0)
Total Lymphocyte: 25.9 %
WBC: 5.6 10*3/uL (ref 3.8–10.8)

## 2021-12-07 LAB — COMPREHENSIVE METABOLIC PANEL
AG Ratio: 1.3 (calc) (ref 1.0–2.5)
ALT: 63 U/L — ABNORMAL HIGH (ref 9–46)
AST: 53 U/L — ABNORMAL HIGH (ref 10–40)
Albumin: 3.4 g/dL — ABNORMAL LOW (ref 3.6–5.1)
Alkaline phosphatase (APISO): 253 U/L — ABNORMAL HIGH (ref 36–130)
BUN/Creatinine Ratio: 13 (calc) (ref 6–22)
BUN: 28 mg/dL — ABNORMAL HIGH (ref 7–25)
CO2: 25 mmol/L (ref 20–32)
Calcium: 9 mg/dL (ref 8.6–10.3)
Chloride: 109 mmol/L (ref 98–110)
Creat: 2.15 mg/dL — ABNORMAL HIGH (ref 0.60–1.29)
Globulin: 2.7 g/dL (calc) (ref 1.9–3.7)
Glucose, Bld: 96 mg/dL (ref 65–99)
Potassium: 4.4 mmol/L (ref 3.5–5.3)
Sodium: 143 mmol/L (ref 135–146)
Total Bilirubin: 0.8 mg/dL (ref 0.2–1.2)
Total Protein: 6.1 g/dL (ref 6.1–8.1)

## 2021-12-07 LAB — TSH: TSH: 2.34 mIU/L (ref 0.40–4.50)

## 2021-12-10 ENCOUNTER — Telehealth: Payer: Self-pay | Admitting: Physician Assistant

## 2021-12-10 ENCOUNTER — Other Ambulatory Visit: Payer: Self-pay | Admitting: Physician Assistant

## 2021-12-10 MED ORDER — IPRATROPIUM BROMIDE 0.06 % NA SOLN: 2.0000 | Freq: Four times a day (QID) | NASAL | 12 refills | Status: DC

## 2021-12-10 NOTE — Telephone Encounter (Signed)
Pt called and said he had an appt last Friday and he said he had a few symptoms then but has gotten worse, back of neck is hurting, head real stuffy, right above eyes and top of nose tight and nose congestion and he is coughing, He wants to know if [name] can call something in, I offered appt because I wasn't sure if he was seen for any of these symptoms. He said he would just like me to send a message back because he is 45 minutes away. Call back [phone].

## 2021-12-10 NOTE — Telephone Encounter (Signed)
Spoke to pt asked him if taking anything OTC? Pt said Mucinex. Told him Aldona Bar recommends the following products: --Robitussin DM  --Mucinex  --Mucinex DM  --Coricidin products  Pt verbalized understanding and said he can not take DM products due to Heart attack. Told him to try Coricidine products. Pt verbalized understanding.Also told him Samantha sent in atrovent nasal spray to the pharmacy for him. Push fluids. Pt verbalized understanding. Asked pt if he did a COVID test? Pt said did one when he was here, it is not COVID. Told pt to try the Atrovent and other OTC products, If worsening/persistent symptoms, needs to be seen for appt. Pt verbalized understanding.

## 2021-12-10 NOTE — Telephone Encounter (Signed)
Please see message and advise 

## 2021-12-13 ENCOUNTER — Other Ambulatory Visit: Payer: Self-pay

## 2021-12-13 ENCOUNTER — Other Ambulatory Visit: Payer: BC Managed Care – PPO

## 2021-12-13 DIAGNOSIS — R7989 Other specified abnormal findings of blood chemistry: Secondary | ICD-10-CM | POA: Diagnosis not present

## 2021-12-14 LAB — BASIC METABOLIC PANEL
BUN/Creatinine Ratio: 13 (calc) (ref 6–22)
BUN: 26 mg/dL — ABNORMAL HIGH (ref 7–25)
CO2: 25 mmol/L (ref 20–32)
Calcium: 9 mg/dL (ref 8.6–10.3)
Chloride: 108 mmol/L (ref 98–110)
Creat: 1.95 mg/dL — ABNORMAL HIGH (ref 0.60–1.29)
Glucose, Bld: 157 mg/dL — ABNORMAL HIGH (ref 65–99)
Potassium: 4.1 mmol/L (ref 3.5–5.3)
Sodium: 143 mmol/L (ref 135–146)

## 2021-12-15 DIAGNOSIS — R42 Dizziness and giddiness: Secondary | ICD-10-CM

## 2021-12-21 DIAGNOSIS — G4733 Obstructive sleep apnea (adult) (pediatric): Secondary | ICD-10-CM | POA: Diagnosis not present

## 2021-12-30 ENCOUNTER — Telehealth: Payer: Self-pay | Admitting: Cardiology

## 2021-12-30 DIAGNOSIS — R42 Dizziness and giddiness: Secondary | ICD-10-CM | POA: Diagnosis not present

## 2021-12-30 NOTE — Telephone Encounter (Signed)
Attempted to call the pt but unable to leave a message due to no voicemail.   Lolita Patella with [employer] called to report that the pt wore a 7 day Zio ordered buy his PCP [name] PA for light headedness and noted he had two back to back pauses adding up to 6.4 sec on 12/19/21 at 2:30 am.   Report loaded in Epic...will forward to Dr. Gasper Sells for his review.   Pt has appt with Dr. Gasper Sells 01/2022.

## 2021-12-30 NOTE — Telephone Encounter (Signed)
°  [employer] calling to report

## 2021-12-30 NOTE — Telephone Encounter (Signed)
Called pt advised of heart monitor results.  Pt reports monitor was ordered by PCP d/t light headedness and SOB.  Showed nocturnal pauses.  Pt advised of MD verbal order to stop metoprolol.  Advised pt to monitor BP/ HR readings and to call in if readings increase.  Pt verbalizes understanding.  No questions or concerns.

## 2022-01-13 ENCOUNTER — Telehealth: Payer: Self-pay | Admitting: Internal Medicine

## 2022-01-13 MED ORDER — REPATHA SURECLICK 140 MG/ML ~~LOC~~ SOAJ: SUBCUTANEOUS | 3 refills | Status: DC

## 2022-01-13 NOTE — Telephone Encounter (Signed)
°*  STAT* If patient is at the pharmacy, call can be transferred to refill team.   1. Which medications need to be refilled? (please list name of each medication and dose if known)   REPATHA SURECLICK 244 MG/ML SOAJ    2. Which pharmacy/location (including street and city if local pharmacy) is medication to be sent to? [id number] - [place], [place] - [address] AT [address]  3. Do they need a 30 day or 90 day supply? 90 day supply

## 2022-01-21 DIAGNOSIS — G4733 Obstructive sleep apnea (adult) (pediatric): Secondary | ICD-10-CM | POA: Diagnosis not present

## 2022-02-04 ENCOUNTER — Ambulatory Visit (INDEPENDENT_AMBULATORY_CARE_PROVIDER_SITE_OTHER): Payer: BC Managed Care – PPO | Admitting: Family Medicine

## 2022-02-04 ENCOUNTER — Encounter: Payer: Self-pay | Admitting: Family Medicine

## 2022-02-04 VITALS — BP 130/71 | HR 70 | Temp 99.6°F | Ht 71.0 in | Wt 385.0 lb

## 2022-02-04 DIAGNOSIS — J029 Acute pharyngitis, unspecified: Secondary | ICD-10-CM

## 2022-02-04 DIAGNOSIS — R7303 Prediabetes: Secondary | ICD-10-CM | POA: Diagnosis not present

## 2022-02-04 DIAGNOSIS — G4483 Primary cough headache: Secondary | ICD-10-CM | POA: Diagnosis not present

## 2022-02-04 DIAGNOSIS — I10 Essential (primary) hypertension: Secondary | ICD-10-CM

## 2022-02-04 LAB — POC COVID19 BINAXNOW: SARS Coronavirus 2 Ag: NEGATIVE

## 2022-02-04 LAB — POCT RAPID STREP A (OFFICE): Rapid Strep A Screen: NEGATIVE

## 2022-02-04 MED ORDER — AZITHROMYCIN 250 MG PO TABS: ORAL_TABLET | ORAL | 0 refills | Status: DC

## 2022-02-04 MED ORDER — AZELASTINE HCL 0.1 % NA SOLN: 2.0000 | Freq: Two times a day (BID) | NASAL | 12 refills | Status: DC

## 2022-02-04 MED ORDER — METHYLPREDNISOLONE ACETATE 80 MG/ML IJ SUSP
80.0000 mg | Freq: Once | INTRAMUSCULAR | Status: AC
  Administered 2022-02-04: 80 mg via INTRAMUSCULAR

## 2022-02-04 NOTE — Progress Notes (Signed)
? ?  Sean [name] is a 51 y.o. male who presents today for an office visit. ? ?Assessment/Plan:  ?New/Acute Problems: ?Sinusitis ?No red flags.  Strep and COVID test both negative.  Given length of symptoms we will start azithromycin and Astelin.  We will also give 80 mg Depo-Medrol today to help with symptom management.  He has received IM Kenalog injection in the past and has done well with this.  Encouraged hydration.  Can continue over-the-counter meds.  Discussed reasons to return to care.  Follow-up as needed. ? ?Chronic Problems Addressed Today: ?HTN -at goal today. He will avoid decongestants. ?Prediabetes - A1c has been well controlled off medications - should not have any significant issues with steroid injection as above. ? ?  ?Subjective:  ?HPI: ? ?Patient here with cough and nasal congestion. Started about 2 weeks. His associated symptoms include headache, fatigue and sore throat. He has been coughing up some green mucus. Symptoms have been the same. He has tried OTC Sudafed with no relief. He had one episode of nose bleed. He was on Zithromax in the past. No side effects. This has helped with the symptoms. Some ear pain. Had Covid test at office today which ws negative. No reported fever or chills.  ? ? ?   ?  ?Objective:  ?Physical Exam: ?BP 130/71 (BP Location: Left Arm)   Pulse 70   Temp 99.6 ?F (37.6 ?C) (Temporal)   Ht '5\' 11"'$  (1.803 m)   Wt (!) 385 lb (174.6 kg)   SpO2 96%   BMI 53.70 kg/m?   ?Gen: No acute distress, resting comfortably ?HEENT: TMs normal.  OP erythematous. ?CV: Regular rate and rhythm with no murmurs appreciated ?Pulm: Normal work of breathing, clear to auscultation bilaterally with no crackles, wheezes, or rhonchi ?Neuro: Grossly normal, moves all extremities ?Psych: Normal affect and thought content ? ?   ? ?I,Savera Zaman,acting as a Education administrator for Dimas Chyle, MD.,have documented all relevant documentation on the behalf of Dimas Chyle, MD,as directed by  Dimas Chyle, MD while in the presence of Dimas Chyle, MD.  ? ?I, Dimas Chyle, MD, have reviewed all documentation for this visit. The documentation on 02/04/22 for the exam, diagnosis, procedures, and orders are all accurate and complete. ? ?Algis Greenhouse. Jerline Pain, MD ?02/04/2022 3:27 PM  ? ?

## 2022-02-04 NOTE — Patient Instructions (Signed)
It was very nice to see you today! ? ?We will give you an injection of the medication today called Depo-Medrol.  This is similar to Kenalog. ? ?Please start the azithromycin nasal spray. ? ?Make sure that you are getting plenty of fluids ? ?Let us know if you are not improving in the next several days. ? ?Take care, ?Dr Jerline Pain ? ?PLEASE NOTE: ? ?If you had any lab tests please let us know if you have not heard back within a few days. You may see your results on mychart before we have a chance to review them but we will give you a call once they are reviewed by [place]. If we ordered any referrals today, please let us know if you have not heard from their office within the next week.  ? ?Please try these tips to maintain a healthy lifestyle: ? ?Eat at least 3 REAL meals and 1-2 snacks per day.  Aim for no more than 5 hours between eating.  If you eat breakfast, please do so within one hour of getting up.  ? ?Each meal should contain half fruits/vegetables, one quarter protein, and one quarter carbs (no bigger than a computer mouse) ? ?Cut down on sweet beverages. This includes juice, soda, and sweet tea.  ? ?Drink at least 1 glass of water with each meal and aim for at least 8 glasses per day ? ?Exercise at least 150 minutes every week.   ?

## 2022-02-04 NOTE — Addendum Note (Signed)
Addended by: Marian Sorrow on: 02/04/2022 03:40 PM ? ? Modules accepted: Orders ? ?

## 2022-02-09 NOTE — Progress Notes (Signed)
Cardiology Office Note:    Date:  02/10/2022   ID:  Sean [name], DOB 1971/09/22, MRN [id number]  PCP:  [name], [facility] Providers Cardiologist:  Werner Lean, MD     Referring MD: Raina Mina., MD   CC: Transition to new cardiologist  History of Present Illness:    Sean [name] is a 51 y.o. male with a hx of  CAD s/p CABG in 2017 (LIMA to LAD), HTN, HLD, Morbid Obesity, PAF with prior DCCV.  OSA on CPAP, and Recovered ischemia cardiomyopathy.  He has a history of statin intolerance.  Seen 08/13/21.  Did not get echo in interim.  Stress was WNL.  Patient notes that he is doing fine.   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 He is seeing Pa Worley for slight transaminitis and CKD There are no interval hospital/ED visit.    No chest pain or pressure.  Does have some burning pain that is Gets a bit of shortness of breath thinks this might be related to sinus pain. and no PND/Orthopnea.  No weight gain.  Notes LE swelling related to not taking lasix because of his job.    No palpitations or syncope.  Takes rare lasix pill for swelling.  Has some morning nausea that is new.    Index pain was jaw and neck pain with left arm numbness and DOE.   Past Medical History:  Diagnosis Date   Abnormal liver enzymes    a. Sees a doctor in [place].   Cardiac cirrhosis    a. possible elevated LFTs/low platelets felt due to cardiac cirrhosis per 2017 admission.   Chronic combined systolic and diastolic CHF (congestive heart failure) (HCC)    CKD (chronic kidney disease), stage II    Congenital heart defect    a. rightward rotation of heart, almost dextrocardia   Coronary artery disease    a. s/p CABGx1 in 12/2015.   Hematuria    a. Chronic hx of this, no prior etiology determined through workup per patient.   Hypercholesteremia    a. Prev taken off statin due to abnormal liver function.   Hypertension    Morbid obesity ([place])    OSA on CPAP     Paroxysmal atrial flutter ([place]) 02/12/2016   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   LIMA to LAD   Sinus bradycardia    Thrombocytopenia ([place])     Past Surgical History:  Procedure Laterality Date   APPENDECTOMY     BIOPSY  02/19/2021   Procedure: BIOPSY;  Surgeon: Thornton Park, MD;  Location: [facility];  Service: Gastroenterology;;   CARDIAC CATHETERIZATION N/A 12/24/2015   Procedure: Right/Left Heart Cath and Coronary Angiography;  Surgeon: Jolaine Artist, MD;  Location: [place] [facility];  Service: Cardiovascular;  Laterality: N/A;   CARDIOVERSION N/A 02/14/2016   Procedure: CARDIOVERSION;  Surgeon: Pixie Casino, MD;  Location: [facility] ENDOSCOPY;  Service: Cardiovascular;  Laterality: N/A;   COLONOSCOPY WITH PROPOFOL N/A 02/19/2021   Procedure: COLONOSCOPY WITH PROPOFOL;  Surgeon: Thornton Park, MD;  Location: [facility];  Service: Gastroenterology;  Laterality: N/A;   CORONARY ARTERY BYPASS GRAFT N/A 12/26/2015   Procedure: Off Pump Coronary Artery Bypass Grafting times one using left internal mammary artery;  Surgeon: Rexene Alberts, MD;  Location: [place];  Service: Open Heart Surgery;  Laterality: N/A;   ESOPHAGOGASTRODUODENOSCOPY (EGD) WITH PROPOFOL N/A 02/19/2021   Procedure: ESOPHAGOGASTRODUODENOSCOPY (EGD) WITH PROPOFOL;  Surgeon: Thornton Park,  MD;  Location: [facility];  Service: Gastroenterology;  Laterality: N/A;   GALLBLADDER SURGERY     HERNIA REPAIR     LEFT HEART CATH AND CORS/GRAFTS ANGIOGRAPHY N/A 04/15/2017   Procedure: Left Heart Cath and Cors/Grafts Angiography;  Surgeon: Troy Sine, MD;  Location: [place] [facility];  Service: Cardiovascular;  Laterality: N/A;   LEFT HEART CATH AND CORS/GRAFTS ANGIOGRAPHY N/A 11/02/2020   Procedure: LEFT HEART CATH AND CORS/GRAFTS ANGIOGRAPHY;  Surgeon: Sherren Mocha, MD;  Location: [place] [facility];  Service: Cardiovascular;  Laterality: N/A;   POLYPECTOMY  02/19/2021   Procedure: POLYPECTOMY;  Surgeon: Thornton Park, MD;  [facility]: [facility];  Service: Gastroenterology;;   TEE WITHOUT CARDIOVERSION N/A 12/26/2015   Procedure: TRANSESOPHAGEAL ECHOCARDIOGRAM (TEE);  Surgeon: Rexene Alberts, MD;  Location: [place];  Service: Open Heart Surgery;  Laterality: N/A;   TEE WITHOUT CARDIOVERSION N/A 02/14/2016   Procedure: TRANSESOPHAGEAL ECHOCARDIOGRAM (TEE);  Surgeon: Pixie Casino, MD;  Location: [facility] ENDOSCOPY;  Service: Cardiovascular;  Laterality: N/A;    Current Medications: Current Meds  Medication Sig   acetaminophen (TYLENOL) 500 MG tablet Take 500-1,500 mg by mouth as needed for moderate pain, fever, headache or mild pain.   allopurinol (ZYLOPRIM) 100 MG tablet Take 1 tablet by mouth daily.   azelastine (ASTELIN) 0.1 % nasal spray Place 2 sprays into both nostrils 2 (two) times daily. (Patient taking differently: Place 2 sprays into both nostrils as needed for rhinitis or allergies.)   cetirizine (ZYRTEC) 10 MG tablet Take 10 mg by mouth daily.   Cyanocobalamin (VITAMIN B-12) 5000 MCG SUBL Place 5,000 mcg under the tongue daily.   ELIQUIS 5 MG TABS tablet TAKE 1 TABLET BY MOUTH TWICE DAILY.   Evolocumab (REPATHA SURECLICK) 315 MG/ML SOAJ INJECT THE CONTENTS OF 1 SYRINGE UNDER THE SKIN EVERY 14 DAYS   famotidine (PEPCID) 20 MG tablet Take 1 tablet (20 mg total) by mouth at bedtime.   furosemide (LASIX) 40 MG tablet Take 40 mg by mouth daily as needed for fluid.   ipratropium (ATROVENT) 0.06 % nasal spray Place 2 sprays into both nostrils 4 (four) times daily.   levothyroxine (SYNTHROID) 50 MCG tablet TAKE 1 TABLET(50 MCG) BY MOUTH DAILY   montelukast (SINGULAIR) 10 MG tablet Take 10 mg by mouth daily.   pantoprazole (PROTONIX) 40 MG tablet Take 1 tablet (40 mg total) by mouth 2 (two) times daily.   potassium chloride (K-DUR,KLOR-CON) 10 MEQ tablet TAKE 1 TABLET ONCE DAILY.   sucralfate (CARAFATE) 1 g tablet Take 1 g by mouth 2 (two) times daily.   [DISCONTINUED] nitroGLYCERIN (NITROSTAT) 0.4  MG SL tablet Place 1 tablet (0.4 mg total) under the tongue every 5 (five) minutes as needed for chest pain.   [DISCONTINUED] sucralfate (CARAFATE) 1 g tablet Dissolve 1 tablet in a small amount of water to make a slurry and take 2-4 times daily (Patient taking differently: 2 (two) times daily. Dissolve 1 tablet in a small amount of water to make a slurry and take 2-4 times daily)     Allergies:   Doxycycline, Imdur [isosorbide dinitrate], Augmentin [amoxicillin-pot clavulanate], and Tape   Social History   Socioeconomic History   Marital status: Legally Separated    Spouse name: Not on file   Number of children: Not on file   Years of education: Not on file   Highest education level: Not on file  Occupational History   Occupation: Truck Geophysicist/field seismologist  Tobacco Use   Smoking  status: Never   Smokeless tobacco: Never  Vaping Use   Vaping Use: Never used  Substance and Sexual Activity   Alcohol use: Yes    Comment: 6 beers per month   Drug use: No   Sexual activity: Not Currently  Other Topics Concern   Not on file  Social History Narrative   Truck driver -- regional   Lives with mom and dad   Social Determinants of Health   Financial Resource Strain: Not on file  Food Insecurity: Not on file  Transportation Needs: Not on file  Physical Activity: Not on file  Stress: Not on file  Social Connections: Not on file    Social: 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  ROS:   Please see the history of present illness.     All other systems reviewed and are negative.  EKGs/Labs/Other Studies Reviewed:    The following studies were reviewed today:  EKG:  EKG is  ordered today.  The ekg ordered today demonstrates  02/10/22: SR rate 60 LVH with secondary  repol 08/13/21: sinus brady rate 53 with LVH  Transthoracic Echocardiogram: Date: 03/31/2016 Results: - Procedure narrative: Transthoracic echocardiography. Image    quality was suboptimal. The study was technically difficult.    Intravenous contrast (Definity) was administered.  - Left ventricle: The cavity size was normal. Wall thickness was    normal. Systolic function was mildly reduced. The estimated    ejection fraction was in the range of 45% to 50%. Features are    consistent with a pseudonormal left ventricular filling pattern,    with concomitant abnormal relaxation and increased filling    pressure (grade 2 diastolic dysfunction).   NM Stress Date: 10/17/2020 Results: Nuclear stress EF: 60%. The left ventricular ejection fraction is normal (55-65%). There was no ST segment deviation noted during stress. No T wave inversion was noted during stress. This is an intermediate risk study.   1. There is a moderate to severe fixed defect of medium size present in the inferior segments, base to apex (15-20% of LV). The wall motion in this region appears normal, so this either represents prior infarction or diaphragm attenuation.  2. There is a moderate to severe, partially reversible, defect present on stress imaging that is medium in size (~10-15% of LV) in the base to mid anterior wall, into the apical septum. The defect is present on rest imaging in the apical septum and mid anterior wall. The wall motion in this region appears to be normal. This either represents prior infarct with peri-infarct ischemia or breast attenuation given the patient's size (BMI 52).  3. Normal LVEF, 60%.  4. This is an intermediate risk study and likely equivocal due to reasons stated above.  Left/Right Heart Catheterizations: Date:11/02/20 Results: 1.  Severe single-vessel coronary artery disease with total occlusion of the ostial LAD 2.  Widely patent left main, left circumflex, and RCA with no  significant stenoses 3.  Patent LIMA to LAD with no significant stenosis present 4.  Normal LVEDP   Recommendations: Ongoing medical therapy.  The patient has stable coronary anatomy with continued patency of the LIMA graft.  Patient may resume apixaban tomorrow morning at his normal dosing schedule.   Recent Labs: 08/13/2021: NT-Pro BNP 79 12/06/2021: ALT 63; Hemoglobin 13.3;  Platelets 66; TSH 2.34 12/13/2021: BUN 26; Creat 1.95; Potassium 4.1; Sodium 143  Recent Lipid Panel    Component Value Date/Time   CHOL 107 08/13/2020 0909   TRIG 118 08/13/2020 0909   HDL 49 08/13/2020 0909   CHOLHDL 2.2 08/13/2020 0909   CHOLHDL 2.7 11/26/2016 0912   VLDL 18 11/26/2016 0912   LDLCALC 37 08/13/2020 0909   LDLDIRECT 45 10/21/2019 0942        Physical Exam:    VS:  BP (!) 162/79    Pulse 60    Ht '5\' 11"'$  (1.803 m)    Wt (!) 391 lb (177.4 kg)    SpO2 98%    BMI 54.53 kg/m     Wt Readings from Last 3 Encounters:  02/10/22 (!) 391 lb (177.4 kg)  02/04/22 (!) 385 lb (174.6 kg)  12/06/21 (!) 383 lb 6.1 oz (173.9 kg)    BP R arm Manual Large cuff: 140/70 02/10/22 BP 130/70  Gen: no distress, morbid obesity   Neck: No JVD,  Cardiac: No Rubs or Gallops, no Murmur, RRR +2 radial pulses Respiratory: Clear to auscultation bilaterally, normal effort, normal  respiratory rate GI: Soft, nontender, non-distended  MS: +1  edema bilateral with compression socks;  moves all extremities Integument: Skin feels warm Neuro:  At time of evaluation, alert and oriented to person/place/time/situation  Psych: Normal affect, patient feels ok   ASSESSMENT:    1. Coronary artery disease involving native coronary artery of native heart without angina pectoris   2. Stage 3a chronic kidney disease ([place])   3. Morbid obesity ([place])     PLAN:    CAD Ischemic cardiomyopathy (recovered) HLD CKD stage IIIa -on eliquis because of AF - continue repatha, if no repeat lipds at next visit will check f/u - has PRN  nitro (no needed); we discussed taking it if needed for his burning CP and if it improves this we will trial IMDUR - we have discussed taking lasix 20 mg PO daily when not working - continue compressions  - DOT license: [id number]; will repeat nuc in 2023  PAF (when in AF feels symptomatic with fatigue) HTN Morbid Obesity OSA on CPAP - diltiazem 120 mg PO daily - metoprolol 25 mg PO daily; can increase if necessary    One year f/u   Medication Adjustments/Labs and Tests Ordered: Current medicines are reviewed at length with the patient today.  Concerns regarding medicines are outlined above.  Orders Placed This Encounter  Procedures   EKG 12-Lead    Meds ordered this encounter  Medications   nitroGLYCERIN (NITROSTAT) 0.4 MG SL tablet    Sig: Place 1 tablet (0.4 mg total) under the tongue every 5 (five) minutes as needed for chest pain.    Dispense:  25 tablet    Refill:  6     Patient Instructions  Medication Instructions:  Your physician recommends that you continue on your current medications as directed. Please refer to the Current Medication list given to you today. REFILLED: Nitroglycerin  *If you need a refill on your cardiac medications before your next appointment, please call your pharmacy*   Lab Work: NONE If you have labs (blood work) drawn today and your tests are completely normal, you will receive your results only by: [place] (if you have MyChart) OR A paper copy in the mail If you have any lab test that is abnormal or we need to change your treatment, we will call you to review the  results.   Testing/Procedures: NONE     Follow-Up: At [facility], you and your health needs are our priority.  As part of our continuing mission to provide you with exceptional heart care, we have created designated Provider Care Teams.  These Care Teams include your primary Cardiologist (physician) and Advanced Practice Providers (APPs -  Physician Assistants  and Nurse Practitioners) who all work together to provide you with the care you need, when you need it.  Your next appointment:   8 month(s)  The format for your next appointment:   In Person  Provider:   Werner Lean, MD  or Melina Copa, PA-C or Ermalinda Barrios, PA-C        :1}    Signed, Werner Lean, MD  02/10/2022 3:56 PM    [facility]

## 2022-02-10 ENCOUNTER — Other Ambulatory Visit: Payer: Self-pay

## 2022-02-10 ENCOUNTER — Ambulatory Visit (INDEPENDENT_AMBULATORY_CARE_PROVIDER_SITE_OTHER): Payer: BC Managed Care – PPO | Admitting: Internal Medicine

## 2022-02-10 ENCOUNTER — Encounter: Payer: Self-pay | Admitting: Internal Medicine

## 2022-02-10 VITALS — BP 162/79 | HR 60 | Ht 71.0 in | Wt 391.0 lb

## 2022-02-10 DIAGNOSIS — N1831 Chronic kidney disease, stage 3a: Secondary | ICD-10-CM | POA: Diagnosis not present

## 2022-02-10 DIAGNOSIS — I2510 Atherosclerotic heart disease of native coronary artery without angina pectoris: Secondary | ICD-10-CM

## 2022-02-10 MED ORDER — NITROGLYCERIN 0.4 MG SL SUBL: 0.4000 mg | SUBLINGUAL_TABLET | SUBLINGUAL | 6 refills | Status: DC | PRN

## 2022-02-10 NOTE — Patient Instructions (Signed)
Medication Instructions:  ?Your physician recommends that you continue on your current medications as directed. Please refer to the Current Medication list given to you today. ?REFILLED: Nitroglycerin ? ?*If you need a refill on your cardiac medications before your next appointment, please call your pharmacy* ? ? ?Lab Work: ?NONE ?If you have labs (blood work) drawn today and your tests are completely normal, you will receive your results only by: ?MyChart Message (if you have MyChart) OR ?A paper copy in the mail ?If you have any lab test that is abnormal or we need to change your treatment, we will call you to review the results. ? ? ?Testing/Procedures: ?NONE ? ?  ? ?Follow-Up: ?At [facility], you and your health needs are our priority.  As part of our continuing mission to provide you with exceptional heart care, we have created designated Provider Care Teams.  These Care Teams include your primary Cardiologist (physician) and Advanced Practice Providers (APPs -  Physician Assistants and Nurse Practitioners) who all work together to provide you with the care you need, when you need it. ? ?Your next appointment:   ?8 month(s) ? ?The format for your next appointment:   ?In Person ? ?Provider:   ?Werner Lean, MD  or Melina Copa, PA-C or Ermalinda Barrios, PA-C      ? ? :1}  ?

## 2022-02-18 DIAGNOSIS — G4733 Obstructive sleep apnea (adult) (pediatric): Secondary | ICD-10-CM | POA: Diagnosis not present

## 2022-02-26 ENCOUNTER — Ambulatory Visit: Payer: BC Managed Care – PPO | Admitting: Physician Assistant

## 2022-02-26 ENCOUNTER — Telehealth: Payer: Self-pay

## 2022-02-26 ENCOUNTER — Other Ambulatory Visit: Payer: Self-pay

## 2022-02-26 ENCOUNTER — Telehealth: Payer: Self-pay | Admitting: Physician Assistant

## 2022-02-26 ENCOUNTER — Telehealth: Payer: Self-pay | Admitting: Gastroenterology

## 2022-02-26 DIAGNOSIS — D696 Thrombocytopenia, unspecified: Secondary | ICD-10-CM

## 2022-02-26 DIAGNOSIS — R1013 Epigastric pain: Secondary | ICD-10-CM

## 2022-02-26 DIAGNOSIS — R7989 Other specified abnormal findings of blood chemistry: Secondary | ICD-10-CM

## 2022-02-26 MED ORDER — POLYETHYLENE GLYCOL 3350 17 GM/SCOOP PO POWD: ORAL | 3 refills | Status: DC

## 2022-02-26 MED ORDER — FAMOTIDINE 20 MG PO TABS: 20.0000 mg | ORAL_TABLET | Freq: Every day | ORAL | 2 refills | Status: DC

## 2022-02-26 MED ORDER — DOCUSATE SODIUM 100 MG PO CAPS: 100.0000 mg | ORAL_CAPSULE | Freq: Every day | ORAL | 0 refills | Status: DC

## 2022-02-26 MED ORDER — SUCRALFATE 1 G PO TABS: ORAL_TABLET | ORAL | 2 refills | Status: DC

## 2022-02-26 NOTE — Telephone Encounter (Signed)
Pt called back in and is refusing to go to the ER as advised. He states he does not want to just go there, sit, and wait. I verbalized that Sean [name] is wanting him to go to ER. He is still refusing.  ?

## 2022-02-26 NOTE — Telephone Encounter (Signed)
LOV 05/15/21 with following plan: ? ?PLAN: ?Increase pantoprazole 40 mg BID ?Add famotidine 20 mg QHS ?Continue Carafate 1g BID-QID ?Work to maintain a health weight ?EGD in 3 years (2025)- Procedures at the hospital ?CT colonography (2027) for colon cancer screening - pt does not wish to have virtual colon as he did not have a good experience ?FibroSURE for further assessment of underlying liver disease ?Follow-up in 6 months, earlier as needed - PT NEVER SCHEDULED ? ?Called pt to inquire further. States he has always "battled with chronic N/V" which he states is becoming worse of the past few weeks. States he has also noticed N/V is worse in the AM rather than PM. States he is also having epigastric pain that "feels like a burning sensation". Confirmed he is taking Pantoprazole BID and Carafate BID which does seem to help his symptoms. Admits he is very inconsistent with taking Pepcid qhs. Dietary habits remain unchanged but states his bowel habits have changed. He is not having to strain to defecate. Stools are hard and formed and with mucus or rectal bleeding. Provided the following recommendations: ? ?Given he is overdue for an appt, scheduled for f/u appt with [name] 03/04/22 ?Continue Pantoprazole BID ?Can increase Carafate to QID if needed ?Become more consistent with taking Pepcid QHS as recommended ?To help alleviate constipation, may take 1 dose of Miralax qd. Can increase to BID with goal to have 1 large soft stool daily without need to strain ?May take 1 dose of stool softener daily ?Drink 64 ounces or more of fluid daily ?Increase activity to promote better bowel habits and reduce risk for constipation ? ?Routing this message to [name] for her to review and to further advise. Uncertain if she would like additional testing PRIOR to OV. ?

## 2022-02-26 NOTE — Telephone Encounter (Signed)
ED refusal per Nurse. ? ?Awaiting full triage notes.  ?

## 2022-02-26 NOTE — Telephone Encounter (Signed)
Spoke with pt to see why he did not want to to to ER and he stated that he did not want to sit over there all dayand that he feels that what he has going on is not that big of a deal. I told him that there was a reason that [name] told him to go to ER bc what he has going on needs further attention. I spoke with [name] about this and she advised me to tell the pt that he really needs to go to ER or to F/U with Santina Evans. Pt stated that he will handle the problem and then he stated that he would go to he ER. ?

## 2022-02-26 NOTE — Telephone Encounter (Signed)
Orders placed per [name] request. Reminder created to ensure pt completes labs in a timely manner. Called pt and informed of [name] recommendations. MAR updated to reflect medication changes/additions.  ?

## 2022-02-26 NOTE — Telephone Encounter (Signed)
Se note  

## 2022-02-26 NOTE — Telephone Encounter (Signed)
Patient called with stomach pain - Stated he has been vomiting on and off since Monday 02/24/22- Pain 4-5. Patient describes his discomfort as a burning/fire feeling on stomach and rib.  ? ?Patient has been triaged.  ? ?Awaiting Triage notes.  ?

## 2022-02-26 NOTE — Telephone Encounter (Signed)
Unable to contact patient - vm not set up.  ?

## 2022-02-26 NOTE — Telephone Encounter (Signed)
Patient called states he is having really bad abdominal pain and burning seeking advise. ?

## 2022-02-26 NOTE — Telephone Encounter (Signed)
Please advise  ? ?Patient ?Name: ?Sean [name] ?MREY ?Gender: Male ?DOB: 25-Aug-1971 ?Age: 51 Y 2 M 8 D ?Return ?Phone ?Number: ?[id number] ?(Primary) ?Address: ?City/ ?State/ ?Zip: ?[place] ?[address] ?Client [place] [facility] [place] [facility] - ?Client ?Presenter, broadcasting at [place] [facility] ?Provider [name]- PA ?Contact Type Call ?Who Is Calling Patient / Member / Family / Caregiver ?Call Type Triage / Clinical ?Relationship To Patient Self ?Return Phone Number [phone] (Primary) ?Chief Complaint SEVERE ABDOMINAL PAIN - Severe pain in ?abdomen ?Reason for Call Symptomatic / Request for Health Information ?Initial Comment Caller states he having abd pain since Monday and ?he feels rib pain. Caller states his abd feels like it ?is on fire. ?Translation No ?Nurse Assessment ?Nurse: Ottis Stain, RN, [place] Date/Time (Eastern Time): 02/26/2022 9:00:37 AM ?Confirm and document reason for call. If ?symptomatic, describe symptoms. ?---Caller states lower abd pain below belly button all ?across the bottom but more to the middle and right. ?Symptoms started on Monday and the symptoms ?are worse. Also having burning type pain under ribs ?at breast bone area. Also had some vomiting 3-4x ?yesterday. States vomit is mucous and blood. No fever. ?+fluids, +UOP in last 8 hrs. ?Does the patient have any new or worsening ?symptoms? ---Yes ?Will a triage be completed? ---Yes ?Related visit to physician within the last 2 weeks? ---No ?Does the PT have any chronic conditions? (i.e. ?diabetes, asthma, this includes High risk factors for ?pregnancy, etc.) ?---Yes ?List chronic conditions. ---Open heart surgery in 2017, HTN ?Is this a behavioral health or substance abuse call? ---No ?Guidelines ?Guideline Title Affirmed Question Affirmed Notes Nurse Date/Time (Eastern ?Time) ?Abdominal Pain - ?Male ?[1] Vomiting AND ?[2] contains red blood ?or black ("coffee ?Ottis Stain, RN, [place] 02/26/2022 9:05:20 ?AM ?PLEASE  NOTE: All timestamps contained within this report are represented as [place] Standard Time. ?CONFIDENTIALTY NOTICE: This fax transmission is intended only for the addressee. It contains information that is legally privileged, confidential or ?otherwise protected from use or disclosure. If you are not the intended recipient, you are strictly prohibited from reviewing, disclosing, copying using ?or disseminating any of this information or taking any action in reliance on or regarding this information. If you have received this fax in error, please ?notify us immediately by telephone so that we can arrange for its return to [place]. Phone: [phone], Toll-Free: [phone], Fax: [phone] ?Page: 2 of 2 ?Call Id: [id number] ?Guidelines ?Guideline Title Affirmed Question Affirmed Notes Nurse Date/Time (Eastern ?Time) ?ground") material ?(Exception: few red ?streaks in vomit that ?only happened once) ?Disp. Time (Eastern ?Time) Disposition Final User ?02/26/2022 8:58:49 AM Send to Urgent Henriette Combs ?02/26/2022 9:14:36 AM Go to ED Now Yes Ottis Stain, RN, Sherrie ?Caller Disagree/Comply Disagree ?Caller Understands Yes ?PreDisposition InappropriateToAsk ?Care Advice Given Per Guideline ?GO TO ED NOW: * You need to be seen in the Emergency Department. * Go to the ED at ___________ [place] now. ?Drive carefully. ?Comments ?User: Evlyn Clines, RN Date/Time Eilene Ghazi Time): 02/26/2022 9:14:27 AM ?Caller states does not want to go to the ED. States had appt made at office for 11:30 a today. States would rather ?go the office. Called office and explained same to [place]. ?Referrals ?GO TO FACILITY REFUSE ?

## 2022-03-03 ENCOUNTER — Other Ambulatory Visit (INDEPENDENT_AMBULATORY_CARE_PROVIDER_SITE_OTHER): Payer: BC Managed Care – PPO

## 2022-03-03 ENCOUNTER — Telehealth: Payer: Self-pay

## 2022-03-03 DIAGNOSIS — D696 Thrombocytopenia, unspecified: Secondary | ICD-10-CM

## 2022-03-03 DIAGNOSIS — R7989 Other specified abnormal findings of blood chemistry: Secondary | ICD-10-CM

## 2022-03-03 DIAGNOSIS — R1013 Epigastric pain: Secondary | ICD-10-CM

## 2022-03-03 LAB — COMPREHENSIVE METABOLIC PANEL
ALT: 43 U/L (ref 0–53)
AST: 39 U/L — ABNORMAL HIGH (ref 0–37)
Albumin: 3.4 g/dL — ABNORMAL LOW (ref 3.5–5.2)
Alkaline Phosphatase: 152 U/L — ABNORMAL HIGH (ref 39–117)
BUN: 22 mg/dL (ref 6–23)
CO2: 28 mEq/L (ref 19–32)
Calcium: 9.5 mg/dL (ref 8.4–10.5)
Chloride: 106 mEq/L (ref 96–112)
Creatinine, Ser: 1.96 mg/dL — ABNORMAL HIGH (ref 0.40–1.50)
GFR: 39.5 mL/min — ABNORMAL LOW (ref >60.00)
Glucose, Bld: 119 mg/dL — ABNORMAL HIGH (ref 70–99)
Potassium: 4 mEq/L (ref 3.5–5.1)
Sodium: 142 mEq/L (ref 135–145)
Total Bilirubin: 0.9 mg/dL (ref 0.2–1.2)
Total Protein: 6.2 g/dL (ref 6.0–8.3)

## 2022-03-03 LAB — CBC
HCT: 39.2 % (ref 39.0–52.0)
Hemoglobin: 13.6 g/dL (ref 13.0–17.0)
MCHC: 34.7 g/dL (ref 30.0–36.0)
MCV: 99.6 fl (ref 78.0–100.0)
Platelets: 46 10*3/uL — CL (ref 150.0–400.0)
RBC: 3.93 Mil/uL — ABNORMAL LOW (ref 4.22–5.81)
RDW: 14 % (ref 11.5–15.5)
WBC: 4.5 10*3/uL (ref 4.0–10.5)

## 2022-03-03 LAB — LIPASE: Lipase: 67 U/L — ABNORMAL HIGH (ref 11.0–59.0)

## 2022-03-03 NOTE — Telephone Encounter (Signed)
Pt notified that it was important for him to have his labs drawn today: ?Pt stated that he was going to come this Afternoon and have them drawn: ?Pt verbalized understanding with all questions answered.  ? ?

## 2022-03-03 NOTE — Telephone Encounter (Signed)
Received a call from the [facility] lab with critical platelet count of 46. Results should be available in epic soon. ?

## 2022-03-03 NOTE — Telephone Encounter (Signed)
Noted  

## 2022-03-04 ENCOUNTER — Ambulatory Visit: Payer: BC Managed Care – PPO | Admitting: Gastroenterology

## 2022-03-04 ENCOUNTER — Encounter: Payer: Self-pay | Admitting: Gastroenterology

## 2022-03-04 VITALS — BP 142/80 | HR 64 | Ht 71.0 in | Wt 284.0 lb

## 2022-03-04 DIAGNOSIS — D696 Thrombocytopenia, unspecified: Secondary | ICD-10-CM

## 2022-03-04 DIAGNOSIS — R112 Nausea with vomiting, unspecified: Secondary | ICD-10-CM

## 2022-03-04 DIAGNOSIS — R1013 Epigastric pain: Secondary | ICD-10-CM

## 2022-03-04 DIAGNOSIS — R7989 Other specified abnormal findings of blood chemistry: Secondary | ICD-10-CM | POA: Diagnosis not present

## 2022-03-04 MED ORDER — FAMOTIDINE 20 MG PO TABS: 20.0000 mg | ORAL_TABLET | Freq: Two times a day (BID) | ORAL | 3 refills | Status: DC

## 2022-03-04 NOTE — Progress Notes (Addendum)
? ?Referring Provider: [name], PA ?Primary Care Physician:  [name], PA ? ?Chief complaint:  Epigastric pain, nausea, vomiting ? ? 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   - unable to advance beyond the distal ascending colon 02/19/21 ?Chronic Eliquis use ? ?Abdominal pain, nausea, and vomiting.  Previously attributed to gastric erosions/gastritis.  Persistent symptoms despite PPI to BID and Carafate twice daily.  Now with recent progression in symptoms. Labs show elevated lipase. Must consider pancreatitis. Plan CT abd/pelvis. However, renal contrast limits use of contrast and he is refusing a CT scan. Open MRI may be limited in the setting of renal insufficiency. Will plan abdominal ultrasound. Repeat lipase later this week. Trial of clear liquids to try to improve his symptoms.  Consider adding famotidine PRN.  Work to maintain a healthy weight. Consider trial of tricycylic antidepressant for long-term management of chronic pain. ? ?Gastric intestinal metaplasia:  Repeat EGD recommended for gastric mapping 2025. ? ?History of tubular adenoma with incomplete colonoscopy 2022: Consider repeat trial at colonoscopy +/- CT colonography 2027. ? ?Suspected underlying liver disease given thrombocytopenia.  Now with progressive thrombocytopenia.  Elastography 2022 showed fatty liver but no advanced fibrosis, however, the study was limited in accuracy due to body habitus. Plan FibroSURE for additional evaluation now.  Referral to hematology, as well, given the additional decline on recent labs. ? ? ? ? ?PLAN: ?- Clear liquid diet may provide some  symptomatic improvement ?- Repeat liver enzymes and lipase in 4-5 days ?- Continue pantoprazole 40 mg BID ?- Add famotidine 20 mg QHS ?- Continue Carafate 1g BID-QID ?- Work to maintain a health weight ?- Abdominal ultrasound with special attention to the pancreas ?- Discussed diaphragmatic breathing to manage chronic abdominal pain ?- Consider TCA in the future ?- EGD in 2025 - Procedures at the hospital ?- CT colonography 2027 for colon cancer screening ?- FibroSURE for further assessment of underlying liver disease ?- Referral to Dr. Bobby Rumpf in [place] (Hematology/Oncology) given progressive thrombocytopenia ?- Referral to [place] primary care to establish primary care ?- Follow-up in 6 months, earlier as needed ? ? ?Please see the "Patient Instructions" section for addition details about the plan. ? ?HPI: Sean [name] is a 51 y.o. male last seen 05/15/21. He was initially referred by his cardiologist for epigastric pain.  He has a history of hyperlipidemia, hypertension, chronic kidney disease, obstructive sleep apnea, coronary artery disease and coronary artery bypass in 2017, NASH, reflux, and thrombocytopenia who has been under evaluation for epigastric pain.  He is on chronic Eliquis. 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   ? ?He was seen by GI at [facility] June 2021 for multiple symptoms. These same symptoms are reported today.  ? ?(1) He reports a 35 year history of epigastric pain. Associated distension.  As a child, his family thought it was because he didn't want to go to school.   Worsens with alcohol use.  Relieved by Carafate.  ? ?(2) Longstanding history of GERD treated the best with esomeprazole.  Did not find pantoprazole as effective. Frequently sleeps elevated in a recliner. ? ?(3) He has early morning emesis that lasts 30 to 45 minutes.  Occasional bleed with streaks of blood.  Feels better after vomiting. Occurs mostly on days when he gets up earlier than  his routine. No dysphagia, odynophagia, dysphonia, sore throat, neck pain.  ? ?(4) Also reports a lower abdominal pain for 2 months without altered bowel habits. Rare constipation and diarrhea. Bright red blood in the stool prior to Christmas. No mucous.  ? ?(5) Liver enzymes have been elevated since 2018. ? ?Thrombocytopenia present since 2015. ?Abdominal ultrasound 01/2018 showed fatty liver ?Labs 03/2020 showed elevated liver enzymes with alk phos 214, AST 41, ALT 54 and thrombocytopenia with platelets of 54.  TSH 4.193.  Normal iron profile. ?Labs 10/2020 showed platelets 58, hemoglobin 14.4 ? ?Rare beer with supper. No history of heavy drinking. No illicit street drugs. No tobacco, chewing tobacco, or e-cigarettes.  ? ?He had an EGD and incomplete colonoscopy 04/21/21 to the distal ascending colon.  Pathology results from his EGD and colonoscopy show gastropathy with intestinal metaplasia, hyperplastic gastric polyps, and normal esophageal biopsies. The polyp removed from his colonoscopy was a small tubular adenoma.  ? ?CT colonography 04/22/21 showed no polyp or mass. He did not tolerate the imaging study well and vowed to never do it again.  ? ?Elastography 01/15/21 showed an echogenic liver, Median kPa: 3.7; however, the study was limited in accuracy due to body habitus.  ? ?At the time of his last visit 05/15/21 he reported ongoing abdominal pain and early morning nausea and vomiting as reported at the time of his initial consultation. It was recommended that he increase pantoprazole to 40 mg BID, add famotidine 20 mg QHS, and continue Carafate 1g BID-QID. ? ?Returns today reporting persistent but chronic symptoms of epigastric burning and lower abdominal pain, nausea, and early morning vomiting.  Symptoms have been worse over the last several weeks despite taking pantoprazole 40 mg twice daily and Carafate twice daily. Last week the pain even radiated around to the back. He inconsistently takes famotidine  nightly.  He has been tolerating a regular diet, although maybe some exacerbation in symptoms with spicy [place] food. Bowel movements are now hard and formed without mucus or bleeding. No change in diet.  ? ?He is worried that stress and anxiety may be contributing. 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  ? ?Labs 10/05/20: lipase 38 ?Labs 01/02/21: alk phos 172, AST 33, ALT 41, alk phos 172, platelets 74 ?Labs 03/03/22: Alk phos 152, AST 39, ALT 43, alk phos 152, lipase 67, WBC 4.5, hemoglobin 13.6, platelets 46 ? ? ?Past Medical History:  ?Diagnosis Date  ? Abnormal liver enzymes   ? a. Sees a doctor in [place].  ? Cardiac cirrhosis   ? a. possible elevated LFTs/low platelets felt due to cardiac cirrhosis per 2017 admission.  ? Chronic combined systolic and diastolic CHF (congestive heart failure) ([place])   ? CKD (chronic kidney disease), stage II   ? Congenital heart defect   ? a. rightward rotation of heart, almost dextrocardia  ? Coronary artery disease   ? a. s/p CABGx1 in 12/2015.  ? Hematuria   ? a. Chronic hx of this, no prior etiology determined through workup per patient.  ? Hypercholesteremia   ? a. Prev taken off statin due to abnormal liver function.  ? Hypertension   ? Morbid obesity ([place])   ? OSA on CPAP   ? Paroxysmal atrial flutter ([place]) 02/12/2016  ?  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  ? LIMA to LAD  ? Sinus bradycardia   ? Thrombocytopenia ([place])   ? ? ?Past Surgical History:  ?Procedure Laterality Date  ? APPENDECTOMY    ? BIOPSY  02/19/2021  ? Procedure: BIOPSY;  Surgeon: Thornton Park, MD;  Location: [facility];  Service: Gastroenterology;;  ? CARDIAC CATHETERIZATION N/A 12/24/2015  ? Procedure: Right/Left Heart Cath and Coronary Angiography;  Surgeon: Jolaine Artist, MD;  Location: [facility];  Service: Cardiovascular;  Laterality: N/A;  ? CARDIOVERSION N/A 02/14/2016  ? Procedure:  CARDIOVERSION;  Surgeon: Pixie Casino, MD;  Location: [facility];  Service: Cardiovascular;  Laterality: N/A;  ? COLONOSCOPY WITH PROPOFOL N/A 02/19/2021  ? Procedure: COLONOSCOPY WITH PROPOFOL;  Surgeon: Tarri Glenn, [place]

## 2022-03-04 NOTE — Patient Instructions (Addendum)
If you are age 49 or older, your body mass index should be between 23-30. Your Body mass index is 39.61 kg/m?Marland Kitchen If this is out of the aforementioned range listed, please consider follow up with your Primary Care Provider. ? ?If you are age 70 or younger, your body mass index should be between 19-25. Your Body mass index is 39.61 kg/m?Marland Kitchen If this is out of the aformentioned range listed, please consider follow up with your Primary Care Provider.  ? ?________________________________________________________ ? ?The [facility] providers would like to encourage you to use [facility] to communicate with providers for non-urgent requests or questions.  Due to long hold times on the telephone, sending your provider a message by [facility] may be a faster and more efficient way to get a response.  Please allow 48 business hours for a response.  Please remember that this is for non-urgent requests.  ?_______________________________________________________ ? ?You will be contacted by [facility] in the next 2 days to arrange a complete abdominal ultrasound.  The number on your caller ID will be [phone], please answer when they call.  If you have not heard from them in 2 days please call [phone] to schedule.   ? ?Your provider has requested that you go to the basement level for lab on Friday 03-07-2022. Press "B" on the elevator. The lab is located at the first door on the left as you exit the elevator.  ? ?I have recommended additional labs, and ultrasound. ? ?A clear liquid diet may give you some additional relief over the next few days.  ? ?I did not stop any of your medications. But, I added famotidine 20 mg twice daily. ? ?I would like for you to see your oncologist to further discuss your low platelets. These are even lower than normal on your recent labs. In the meantime, we will use a blood test to monitor your liver health as this may cause low platelets.  ? ?We will refer you to a new primary care  provider.  ? ?[place], [place] [address]. Main Line: [phone]. Sports Medicine: [phone]. Fax: [phone]. Hours (M-F): 8 am - 5 pm. ? ?We discussed belly breathing as a way to improve your abdominal pain. Sometimes this is called abdominal breathing or diaphragmatic breathing.  ? ?Sit upright in a chair. Your spine should be straight, knees bent, shoulders loose, and head and neck relaxed. ? ?You mouth can be slightly open with teeth separated.  ? ?Place on hand on your chest and one hand on your abdomen, just below the rib cage. ? ?Breathe in for 4 second through your nose and fee your stomach pushing out against your hand. Try to keep the hand on your chest from moving.  ? ?Hold your stomach muscles tight and then start to let your belly deflate as you exhale for 6 seconds. It helps to purse your lips on the exhale like you are blowing through a straw.  ? ?I like this video from the [facility] if you would like more information: ?https://[email or web address].be/[email or web address] ? ?It was a pleasure to see you today! ? ?Thank you for trusting me with your gastrointestinal care!   ? ? ?

## 2022-03-10 ENCOUNTER — Other Ambulatory Visit (INDEPENDENT_AMBULATORY_CARE_PROVIDER_SITE_OTHER): Payer: BC Managed Care – PPO

## 2022-03-10 DIAGNOSIS — D696 Thrombocytopenia, unspecified: Secondary | ICD-10-CM

## 2022-03-10 DIAGNOSIS — R112 Nausea with vomiting, unspecified: Secondary | ICD-10-CM

## 2022-03-10 DIAGNOSIS — R1013 Epigastric pain: Secondary | ICD-10-CM | POA: Diagnosis not present

## 2022-03-10 DIAGNOSIS — R7989 Other specified abnormal findings of blood chemistry: Secondary | ICD-10-CM | POA: Diagnosis not present

## 2022-03-10 LAB — HEPATIC FUNCTION PANEL
ALT: 49 U/L (ref 0–53)
AST: 39 U/L — ABNORMAL HIGH (ref 0–37)
Albumin: 3.4 g/dL — ABNORMAL LOW (ref 3.5–5.2)
Alkaline Phosphatase: 167 U/L — ABNORMAL HIGH (ref 39–117)
Bilirubin, Direct: 0.2 mg/dL (ref 0.0–0.3)
Total Bilirubin: 0.9 mg/dL (ref 0.2–1.2)
Total Protein: 6.4 g/dL (ref 6.0–8.3)

## 2022-03-10 LAB — LIPASE: Lipase: 69 U/L — ABNORMAL HIGH (ref 11.0–59.0)

## 2022-03-14 ENCOUNTER — Ambulatory Visit: Payer: BC Managed Care – PPO | Admitting: Physician Assistant

## 2022-03-17 ENCOUNTER — Other Ambulatory Visit: Payer: Self-pay

## 2022-03-17 DIAGNOSIS — R7989 Other specified abnormal findings of blood chemistry: Secondary | ICD-10-CM

## 2022-03-17 DIAGNOSIS — R112 Nausea with vomiting, unspecified: Secondary | ICD-10-CM

## 2022-03-18 NOTE — Progress Notes (Signed)
Sean [name] is a 51 y.o. male here for a follow up of a pre-existing problem. ? ? ?History of Present Illness:  ? ?Chief Complaint  ?Patient presents with  ? Follow-up  ?  Pt states he is feeling better, no light headedness, but still c/o abdominal pain; pt states no concerns to discuss  ?Sean [name] presented to today's visit with his aunt, Sean [name]. ? ?HPI ? ?Epigastric Pain ?Sean [name] presents today for f/u of his abdominal pain. Since our previous visit, pt has followed up with [name], gastroenterology with most recent visit being on 03/04/22. At the time pt was still c/o of abdominal pain, nausea, and vomiting despite remaining compliant with protonix 40 mg BID and carafate 1 g BID-QID. With taking his past hx into consideration,  ? ?He is going to have updated blood work and [place].  ? ?HTN ?Currently compliant with toprol XL 25 mg daily and lasix 40 mg as needed with no complications. Despite this, pt has been experiencing LE swelling accompanied by itching mainly in his ankles and lower legs. States that he finds if he applies pressure while itching, the sensation improves. Reports that he has been wearing compression hose to improve this, but this hasn't helped. Was on 120 mg diltiazem daily but it is unclear if he should still be on. At home blood pressure readings are: not checked. Patient denies chest pain, SOB, blurred vision, dizziness, or unusual headaches.  Denies excessive caffeine intake, stimulant usage, excessive alcohol intake, or increase in salt consumption. ? ?BP Readings from Last 3 Encounters:  ?03/19/22 (!) 161/74  ?03/04/22 (!) 142/80  ?02/10/22 (!) 162/79  ? ?Thrombocytopenia ?Last platelets were 46. He has not followed up with his hematologist as recommended by GI. Denies excessive bleeding or bruising. ? ? ?Lab Results  ?Component Value Date  ? PLT 46.0 Repeated and verified X2. (LL) 03/03/2022  ? ? ? ? ?Past Medical History:  ?Diagnosis Date  ? Abnormal liver enzymes   ? a. Sees a doctor  in [place].  ? Cardiac cirrhosis   ? a. possible elevated LFTs/low platelets felt due to cardiac cirrhosis per 2017 admission.  ? Chronic combined systolic and diastolic CHF (congestive heart failure) ([place])   ? CKD (chronic kidney disease), stage II   ? Congenital heart defect   ? a. rightward rotation of heart, almost dextrocardia  ? Coronary artery disease   ? a. s/p CABGx1 in 12/2015.  ? Hematuria   ? a. Chronic hx of this, no prior etiology determined through workup per patient.  ? Hypercholesteremia   ? a. Prev taken off statin due to abnormal liver function.  ? Hypertension   ? Morbid obesity ([place])   ? OSA on CPAP   ? Paroxysmal atrial flutter ([place]) 02/12/2016  ? 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  ? LIMA to LAD  ? Sinus bradycardia   ? Thrombocytopenia ([place])   ? ?  ?Social History  ? ?Tobacco Use  ? Smoking status: Never  ? Smokeless tobacco: Never  ?Vaping Use  ? Vaping Use: Never used  ?Substance Use Topics  ? Alcohol use: Yes  ?  Comment: 6 beers per month  ? Drug use: No  ? ? ?Past Surgical History:  ?Procedure Laterality Date  ? APPENDECTOMY    ? BIOPSY  02/19/2021  ? Procedure: BIOPSY;  Surgeon: Thornton Park, MD;  Location: [facility];  Service: Gastroenterology;;  ? CARDIAC CATHETERIZATION N/A 12/24/2015  ? Procedure: Right/Left Heart Cath and Coronary  Angiography;  Surgeon: Jolaine Artist, MD;  Location: [facility];  Service: Cardiovascular;  Laterality: N/A;  ? CARDIOVERSION N/A 02/14/2016  ? Procedure: CARDIOVERSION;  Surgeon: Pixie Casino, MD;  Location: [facility] END[facility]COPY;  Service: Cardiovascular;  Laterality: N/A;  ? COLONOSCOPY WITH PROPOFOL N/A 02/19/2021  ? Procedure: COLONOSCOPY WITH PROPOFOL;  Surgeon: Thornton Park, MD;  Location: [facility];  Service: Gastroenterology;  Laterality: N/A;  ? CORONARY ARTERY BYPASS GRAFT N/A 12/26/2015  ? Procedure: Off Pump Coronary Artery Bypass Grafting times one using left internal mammary artery;  Surgeon: Rexene Alberts, MD;  Location:  [facility];  Service: Open Heart Surgery;  Laterality: N/A;  ? ESOPHAGOGASTRODUODENOSCOPY (EGD) WITH PROPOFOL N/A 02/19/2021  ? Procedure: ESOPHAGOGASTRODUODENOSCOPY (EGD) WITH PROPOFOL;  Surgeon: Thornton Park, MD;  Location: [facility];  Service: Gastroenterology;  Laterality: N/A;  ? GALLBLADDER SURGERY    ? HERNIA REPAIR    ? LEFT HEART CATH AND CORS/GRAFTS ANGIOGRAPHY N/A 04/15/2017  ? Procedure: Left Heart Cath and Cors/Grafts Angiography;  Surgeon: Troy Sine, MD;  Location: [facility];  Service: Cardiovascular;  Laterality: N/A;  ? LEFT HEART CATH AND CORS/GRAFTS ANGIOGRAPHY N/A 11/02/2020  ? Procedure: LEFT HEART CATH AND CORS/GRAFTS ANGIOGRAPHY;  Surgeon: Sherren Mocha, MD;  Location: [facility];  Service: Cardiovascular;  Laterality: N/A;  ? POLYPECTOMY  02/19/2021  ? Procedure: POLYPECTOMY;  Surgeon: Thornton Park, MD;  Location: [facility];  Service: Gastroenterology;;  ? TEE WITHOUT CARDIOVERSION N/A 12/26/2015  ? Procedure: TRANSESOPHAGEAL ECHOCARDIOGRAM (TEE);  Surgeon: Rexene Alberts, MD;  Location: [facility];  Service: Open Heart Surgery;  Laterality: N/A;  ? TEE WITHOUT CARDIOVERSION N/A 02/14/2016  ? Procedure: TRANSESOPHAGEAL ECHOCARDIOGRAM (TEE);  Surgeon: Pixie Casino, MD;  Location: [facility];  Service: Cardiovascular;  Laterality: N/A;  ? ? ?Family History  ?Problem Relation Age of Onset  ? CAD Father   ?     2 stents ~ 54  ? Diabetes Father   ? Heart attack Other   ? Hyperlipidemia Other   ? Diabetes Maternal Grandfather   ? Diabetes Maternal Uncle   ? ? ?Allergies  ?Allergen Reactions  ? Doxycycline Shortness Of Breath  ? Imdur [Isosorbide Dinitrate] Diarrhea  ?  Pt states causes "diarrhea."  ? Augmentin [Amoxicillin-Pot Clavulanate] Itching  ? Tape Rash  ? ? ?Current Medications:  ? ?Current Outpatient Medications:  ?  acetaminophen (TYLENOL) 500 MG tablet, Take 500-1,500 mg by mouth as needed for moderate pain, fever, headache or mild pain., Disp: , Rfl:  ?   allopurinol (ZYLOPRIM) 100 MG tablet, Take 1 tablet by mouth daily., Disp: , Rfl:  ?  azelastine (ASTELIN) 0.1 % nasal spray, Place 2 sprays into both nostrils 2 (two) times daily. (Patient taking differently: Place 2 sprays into both nostrils as needed for rhinitis or allergies.), Disp: 30 mL, Rfl: 12 ?  Cyanocobalamin (VITAMIN B-12) 5000 MCG SUBL, Place 5,000 mcg under the tongue daily., Disp: , Rfl:  ?  docusate sodium (STOOL SOFTENER) 100 MG capsule, Take 1 capsule (100 mg total) by mouth daily., Disp: 30 capsule, Rfl: 0 ?  ELIQUIS 5 MG TABS tablet, TAKE 1 TABLET BY MOUTH TWICE DAILY., Disp: 60 tablet, Rfl: 5 ?  Evolocumab (REPATHA SURECLICK) 627 MG/ML SOAJ, INJECT THE CONTENTS OF 1 SYRINGE UNDER THE SKIN EVERY 14 DAYS, Disp: 6 mL, Rfl: 3 ?  famotidine (PEPCID) 20 MG tablet, Take 1 tablet (20 mg total) by mouth daily., Disp: 60 tablet, Rfl:  2 ?  famotidine (PEPCID) 20 MG tablet, Take 1 tablet (20 mg total) by mouth 2 (two) times daily., Disp: 60 tablet, Rfl: 3 ?  furosemide (LASIX) 40 MG tablet, Take 40 mg by mouth daily as needed for fluid., Disp: , Rfl:  ?  ipratropium (ATROVENT) 0.06 % nasal spray, Place 2 sprays into both nostrils 4 (four) times daily., Disp: 15 mL, Rfl: 12 ?  levothyroxine (SYNTHROID) 50 MCG tablet, TAKE 1 TABLET(50 MCG) BY MOUTH DAILY, Disp: , Rfl:  ?  metoprolol succinate (TOPROL-XL) 25 MG 24 hr tablet, Take 25 mg by mouth daily., Disp: , Rfl:  ?  montelukast (SINGULAIR) 10 MG tablet, Take 10 mg by mouth daily., Disp: , Rfl:  ?  nitroGLYCERIN (NITROSTAT) 0.4 MG SL tablet, Place 1 tablet (0.4 mg total) under the tongue every 5 (five) minutes as needed for chest pain., Disp: 25 tablet, Rfl: 6 ?  pantoprazole (PROTONIX) 40 MG tablet, Take 1 tablet (40 mg total) by mouth 2 (two) times daily., Disp: 180 tablet, Rfl: 3 ?  polyethylene glycol powder (GLYCOLAX/MIRALAX) 17 GM/SCOOP powder, Take 1 dose daily. May increase to BID if needed, Disp: 255 g, Rfl: 3 ?  potassium chloride (K-DUR,KLOR-CON)  10 MEQ tablet, TAKE 1 TABLET ONCE DAILY., Disp: 30 tablet, Rfl: 8 ?  sucralfate (CARAFATE) 1 g tablet, Take 1g po BID. May increase to QID for acute symptoms, Disp: 60 tablet, Rfl: 2  ? ?Review of Systems

## 2022-03-19 ENCOUNTER — Ambulatory Visit (INDEPENDENT_AMBULATORY_CARE_PROVIDER_SITE_OTHER): Payer: BC Managed Care – PPO | Admitting: Physician Assistant

## 2022-03-19 VITALS — BP 161/74 | HR 59 | Temp 98.4°F | Ht 71.0 in | Wt 387.0 lb

## 2022-03-19 DIAGNOSIS — D693 Immune thrombocytopenic purpura: Secondary | ICD-10-CM | POA: Diagnosis not present

## 2022-03-19 DIAGNOSIS — I10 Essential (primary) hypertension: Secondary | ICD-10-CM | POA: Diagnosis not present

## 2022-03-19 DIAGNOSIS — R1013 Epigastric pain: Secondary | ICD-10-CM | POA: Diagnosis not present

## 2022-03-19 LAB — COMPREHENSIVE METABOLIC PANEL
ALT: 36 U/L (ref 0–53)
AST: 30 U/L (ref 0–37)
Albumin: 3.3 g/dL — ABNORMAL LOW (ref 3.5–5.2)
Alkaline Phosphatase: 169 U/L — ABNORMAL HIGH (ref 39–117)
BUN: 20 mg/dL (ref 6–23)
CO2: 28 mEq/L (ref 19–32)
Calcium: 9 mg/dL (ref 8.4–10.5)
Chloride: 107 mEq/L (ref 96–112)
Creatinine, Ser: 1.97 mg/dL — ABNORMAL HIGH (ref 0.40–1.50)
GFR: 39.25 mL/min — ABNORMAL LOW (ref >60.00)
Glucose, Bld: 124 mg/dL — ABNORMAL HIGH (ref 70–99)
Potassium: 4.3 mEq/L (ref 3.5–5.1)
Sodium: 140 mEq/L (ref 135–145)
Total Bilirubin: 0.8 mg/dL (ref 0.2–1.2)
Total Protein: 6.3 g/dL (ref 6.0–8.3)

## 2022-03-19 LAB — URINALYSIS, ROUTINE W REFLEX MICROSCOPIC
Bilirubin Urine: NEGATIVE
Ketones, ur: NEGATIVE
Leukocytes,Ua: NEGATIVE
Nitrite: NEGATIVE
Specific Gravity, Urine: 1.02 (ref 1.000–1.030)
Total Protein, Urine: 100 — AB
Urine Glucose: NEGATIVE
Urobilinogen, UA: 1 (ref 0.0–1.0)
pH: 6 (ref 5.0–8.0)

## 2022-03-19 MED ORDER — DILTIAZEM HCL ER 120 MG PO CP12: 120.0000 mg | ORAL_CAPSULE | Freq: Two times a day (BID) | ORAL | 1 refills | Status: DC

## 2022-03-19 NOTE — Patient Instructions (Addendum)
It was great to see you! ? ?Please follow-up with Dr. Bobby Rumpf regarding your platelets ?Check your blood pressure 3 times a week and write it down ?Restart 120 mg diltiazem daily ?I will be in touch with Dr. Loletha Grayer about your blood pressure and fluid status ? ?When you get your ultrasound, we will make sure to evaluate your kidneys ? ?Let's follow-up in 3-6 months, sooner if you have concerns. ? ?Take care, ? ?[name] PA-C  ?

## 2022-03-21 DIAGNOSIS — G4733 Obstructive sleep apnea (adult) (pediatric): Secondary | ICD-10-CM | POA: Diagnosis not present

## 2022-03-23 ENCOUNTER — Other Ambulatory Visit: Payer: Self-pay | Admitting: Oncology

## 2022-03-23 DIAGNOSIS — D693 Immune thrombocytopenic purpura: Secondary | ICD-10-CM

## 2022-03-23 DIAGNOSIS — D473 Essential (hemorrhagic) thrombocythemia: Secondary | ICD-10-CM

## 2022-03-23 NOTE — Progress Notes (Signed)
?[facility]  ?[address] ?[place],  [place]  [address] ?[phone] ? ?Clinic Day:  03/24/2022 ? ?Referring physician: [name], PA ? ? ?HISTORY OF PRESENT ILLNESS:  ?The patient is a 51 y.o. 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  His platelets have chronically held around 70k. He comes back in today due to his recently low platelet count seen at his primary care office.  Despite his low platelets, he denies having any bruising/bleeding issues which concern him for severe thrombocytopenia being present. ? ?PHYSICAL EXAM:  ?Blood pressure (!) 177/88, pulse 65, temperature 98 ?F (36.7 ?C), resp. rate 20, height '5\' 11"'$  (1.803 m), weight (!) 387 lb 9.6 oz (175.8 kg), SpO2 95 %. ?Wt Readings from Last 3 Encounters:  ?03/24/22 (!) 387 lb 9.6 oz (175.8 kg)  ?03/19/22 (!) 387 lb (175.5 kg)  ?03/04/22 284 lb (128.8 kg)  ? ?Body mass index is 54.06 kg/m?Marland Kitchen ?Performance status (ECOG): 1 - Symptomatic but completely ambulatory ?Physical Exam ?Constitutional:   ?   Appearance: Normal appearance. He is not ill-appearing.  ?HENT:  ?   Mouth/Throat:  ?   Mouth: Mucous membranes are moist.  ?   Pharynx: Oropharynx is clear. No oropharyngeal exudate or posterior oropharyngeal erythema.  ?Cardiovascular:  ?   Rate and Rhythm: Normal rate and regular rhythm.  ?   Heart sounds: No murmur heard. ?  No friction rub. No gallop.  ?Pulmonary:  ?   Effort: Pulmonary effort is normal. No respiratory distress.  ?   Breath sounds: Normal breath sounds. No wheezing, rhonchi or rales.  ?Abdominal:  ?   General: Bowel sounds are normal. There is no distension.  ?   Palpations: Abdomen is soft. There is no mass.  ?   Tenderness: There is no abdominal tenderness.  ?Musculoskeletal:     ?   General: No swelling.  ?   Right lower leg: No edema.  ?   Left lower leg: No edema.  ?Lymphadenopathy:  ?   Cervical: No cervical adenopathy.  ?   Upper Body:  ?   Right upper body: No  supraclavicular or axillary adenopathy.  ?   Left upper body: No supraclavicular or axillary adenopathy.  ?   Lower Body: No right inguinal adenopathy. No left inguinal adenopathy.  ?Skin: ?   General: Skin is warm.  ?   Coloration: Skin is not jaundiced.  ?   Findings: No lesion or rash.  ?Neurological:  ?   General: No focal deficit present.  ?   Mental Status: He is alert and oriented to person, place, and time. Mental status is at baseline.  ?Psychiatric:     ?   Mood and Affect: Mood normal.     ?   Behavior: Behavior normal.     ?   Thought Content: Thought content normal.  ? ? ?LABS:  ? ? ?  Latest Ref Rng & Units 03/24/2022  ? 12:00 AM 03/03/2022  ?  3:29 PM 12/06/2021  ?  3:45 PM  ?CBC  ?WBC  4.4      4.5   5.6    ?Hemoglobin 13.5 - 17.5 12.9      13.6   13.3    ?Hematocrit 41 - 53 40      39.2   38.6    ?Platelets 150 - 400 K/uL 52      46.0 Repeated and verified X2.   66    ?  ?  This result is from an external source.  ? ? ?  Latest Ref Rng & Units 03/24/2022  ? 12:00 AM 03/19/2022  ?  9:10 AM 03/10/2022  ? 12:53 PM  ?CMP  ?Glucose 70 - 99 mg/dL  124     ?BUN 4 - '21 25      20     '$ ?Creatinine 0.6 - 1.3 2.1      1.97     ?Sodium 137 - 147 142      140     ?Potassium 3.5 - 5.1 mEq/L 3.9      4.3     ?Chloride 99 - 108 108      107     ?CO2 13 - '22 31      28     '$ ?Calcium 8.7 - 10.7 8.5      9.0     ?Total Protein 6.0 - 8.3 g/dL  6.3   6.4    ?Total Bilirubin 0.2 - 1.2 mg/dL  0.8   0.9    ?Alkaline Phos 25 - 125 192      169   167    ?AST 14 - 40 47      30   39    ?ALT 10 - 40 U/L 51      36   49    ?  ? This result is from an external source.  ? ?Lab Results  ?Component Value Date  ? TIBC 438 03/24/2022  ? FERRITIN 66 03/24/2022  ? IRONPCTSAT 22 03/24/2022  ? ?Review Flowsheet   ? ?  ?  Latest Ref Rng & Units 03/24/2022  ?Oncology Labs  ?Ferritin 24 - 336 ng/mL 66    ?%SAT 17.9 - 39.5 % 22    ?  ? ? Multiple values from one day are sorted in reverse-chronological order  ?  ?  ? ?ASSESSMENT & PLAN:   ?Assessment/Plan:  A 51 y.o. male with thrombocytopenia.  His platelet count today is mildly lower than what it has been in the past.  We will get in his peripheral counts, his white cells and platelets also slightly lower than what they have been in the past.  Overall, my concern is this gentleman may have underlying fatty liver disease and splenomegaly, which can lead to progressive cytopenias over time.  Despite his platelet count being low, he still has more than enough platelets to do the clotting his body needs.  I will see him back in 4 months for repeat clinical assessment. .The patient understands all the plans discussed today and is in agreement with them.   ? ? ?Arjan Strohm Macarthur Critchley, MD   ? ? ?  ?

## 2022-03-24 ENCOUNTER — Other Ambulatory Visit: Payer: Self-pay | Admitting: Oncology

## 2022-03-24 ENCOUNTER — Other Ambulatory Visit: Payer: Self-pay | Admitting: Hematology and Oncology

## 2022-03-24 ENCOUNTER — Inpatient Hospital Stay: Payer: BC Managed Care – PPO | Attending: Oncology | Admitting: Oncology

## 2022-03-24 ENCOUNTER — Telehealth: Payer: Self-pay | Admitting: Oncology

## 2022-03-24 ENCOUNTER — Other Ambulatory Visit: Payer: Self-pay

## 2022-03-24 ENCOUNTER — Inpatient Hospital Stay: Payer: BC Managed Care – PPO

## 2022-03-24 VITALS — BP 177/88 | HR 65 | Temp 98.0°F | Resp 20 | Ht 71.0 in | Wt 387.6 lb

## 2022-03-24 DIAGNOSIS — K760 Fatty (change of) liver, not elsewhere classified: Secondary | ICD-10-CM | POA: Diagnosis not present

## 2022-03-24 DIAGNOSIS — D693 Immune thrombocytopenic purpura: Secondary | ICD-10-CM | POA: Diagnosis not present

## 2022-03-24 DIAGNOSIS — D696 Thrombocytopenia, unspecified: Secondary | ICD-10-CM | POA: Insufficient documentation

## 2022-03-24 DIAGNOSIS — D72819 Decreased white blood cell count, unspecified: Secondary | ICD-10-CM | POA: Insufficient documentation

## 2022-03-24 DIAGNOSIS — R161 Splenomegaly, not elsewhere classified: Secondary | ICD-10-CM | POA: Diagnosis not present

## 2022-03-24 DIAGNOSIS — R112 Nausea with vomiting, unspecified: Secondary | ICD-10-CM

## 2022-03-24 DIAGNOSIS — R7989 Other specified abnormal findings of blood chemistry: Secondary | ICD-10-CM

## 2022-03-24 LAB — HEPATIC FUNCTION PANEL
ALT: 51 U/L — AB (ref 10–40)
AST: 47 — AB (ref 14–40)
Alkaline Phosphatase: 192 — AB (ref 25–125)
Bilirubin, Total: 1

## 2022-03-24 LAB — IRON AND TIBC
Iron: 98 ug/dL (ref 45–182)
Saturation Ratios: 22 % (ref 17.9–39.5)
TIBC: 438 ug/dL (ref 250–450)
UIBC: 340 ug/dL

## 2022-03-24 LAB — LIPASE, BLOOD: Lipase: 61 U/L — ABNORMAL HIGH (ref 11–51)

## 2022-03-24 LAB — BASIC METABOLIC PANEL
BUN: 25 — AB (ref 4–21)
CO2: 31 — AB (ref 13–22)
Chloride: 108 (ref 99–108)
Creatinine: 2.1 — AB (ref 0.6–1.3)
Glucose: 122
Potassium: 3.9 mEq/L (ref 3.5–5.1)
Sodium: 142 (ref 137–147)

## 2022-03-24 LAB — CBC AND DIFFERENTIAL
HCT: 40 — AB (ref 41–53)
Hemoglobin: 12.9 — AB (ref 13.5–17.5)
Neutrophils Absolute: 2.2
Platelets: 52 10*3/uL — AB (ref 150–400)
WBC: 4.4

## 2022-03-24 LAB — COMPREHENSIVE METABOLIC PANEL
Albumin: 3.5 (ref 3.5–5.0)
Calcium: 8.5 — AB (ref 8.7–10.7)

## 2022-03-24 LAB — FERRITIN: Ferritin: 66 ng/mL (ref 24–336)

## 2022-03-24 LAB — CBC
MCV: 99 — AB (ref 80–94)
RBC: 3.99 (ref 3.87–5.11)

## 2022-03-24 LAB — VITAMIN B12: Vitamin B-12: 3913 pg/mL — ABNORMAL HIGH (ref 180–914)

## 2022-03-24 LAB — FOLATE: Folate: 4.7 ng/mL — ABNORMAL LOW (ref >5.9)

## 2022-03-24 NOTE — Telephone Encounter (Signed)
Per 03/24/22 los next appt scheduled and confirmed with patient ?

## 2022-04-07 ENCOUNTER — Ambulatory Visit (HOSPITAL_COMMUNITY)
Admission: RE | Admit: 2022-04-07 | Discharge: 2022-04-07 | Disposition: A | Discharge status: Home / Self Care | Payer: BC Managed Care – PPO | Source: Ambulatory Visit | Attending: Gastroenterology | Admitting: Gastroenterology

## 2022-04-07 DIAGNOSIS — D696 Thrombocytopenia, unspecified: Secondary | ICD-10-CM | POA: Diagnosis not present

## 2022-04-07 DIAGNOSIS — R112 Nausea with vomiting, unspecified: Secondary | ICD-10-CM | POA: Insufficient documentation

## 2022-04-07 DIAGNOSIS — R1013 Epigastric pain: Secondary | ICD-10-CM | POA: Insufficient documentation

## 2022-04-07 DIAGNOSIS — R7989 Other specified abnormal findings of blood chemistry: Secondary | ICD-10-CM | POA: Diagnosis not present

## 2022-04-07 DIAGNOSIS — K760 Fatty (change of) liver, not elsewhere classified: Secondary | ICD-10-CM | POA: Diagnosis not present

## 2022-04-16 ENCOUNTER — Telehealth: Payer: Self-pay

## 2022-04-16 DIAGNOSIS — I10 Essential (primary) hypertension: Secondary | ICD-10-CM

## 2022-04-16 DIAGNOSIS — I2510 Atherosclerotic heart disease of native coronary artery without angina pectoris: Secondary | ICD-10-CM

## 2022-04-16 DIAGNOSIS — E7849 Other hyperlipidemia: Secondary | ICD-10-CM

## 2022-04-16 NOTE — Telephone Encounter (Signed)
Patient is requesting all medications to be sent to [facility] in [place].  Patient could not inform me what medications.  Please give patient a call back to review what needs to be sent.

## 2022-04-16 NOTE — Telephone Encounter (Signed)
Spoke to pt asked him what medications does he need filled? Pt said he does not know but has not gotten any of his medications filled. Told pt I need to know what he needs and I will gladly send in for you. Pt said he is at work and his medications are at home and does not know name of medications. Told him to let me know. Pt verbalized understanding. ?

## 2022-04-17 ENCOUNTER — Other Ambulatory Visit: Payer: Self-pay

## 2022-04-17 DIAGNOSIS — E7849 Other hyperlipidemia: Secondary | ICD-10-CM

## 2022-04-17 DIAGNOSIS — I10 Essential (primary) hypertension: Secondary | ICD-10-CM

## 2022-04-17 DIAGNOSIS — I2510 Atherosclerotic heart disease of native coronary artery without angina pectoris: Secondary | ICD-10-CM

## 2022-04-17 DIAGNOSIS — R1013 Epigastric pain: Secondary | ICD-10-CM

## 2022-04-17 MED ORDER — POTASSIUM CHLORIDE CRYS ER 10 MEQ PO TBCR: 10.0000 meq | EXTENDED_RELEASE_TABLET | Freq: Every day | ORAL | 1 refills | Status: DC

## 2022-04-17 MED ORDER — METOPROLOL SUCCINATE ER 25 MG PO TB24: 25.0000 mg | ORAL_TABLET | Freq: Every day | ORAL | 1 refills | Status: DC

## 2022-04-17 MED ORDER — MONTELUKAST SODIUM 10 MG PO TABS: 10.0000 mg | ORAL_TABLET | Freq: Every day | ORAL | 1 refills | Status: DC

## 2022-04-17 MED ORDER — APIXABAN 5 MG PO TABS: 5.0000 mg | ORAL_TABLET | Freq: Two times a day (BID) | ORAL | 1 refills | Status: DC

## 2022-04-17 MED ORDER — ALLOPURINOL 100 MG PO TABS: 100.0000 mg | ORAL_TABLET | Freq: Every day | ORAL | 1 refills | Status: DC

## 2022-04-17 MED ORDER — LEVOTHYROXINE SODIUM 50 MCG PO TABS: 50.0000 ug | ORAL_TABLET | Freq: Every day | ORAL | 1 refills | Status: DC

## 2022-04-17 NOTE — Telephone Encounter (Signed)
Please see note.

## 2022-04-17 NOTE — Telephone Encounter (Signed)
Called pt to advise Rx sent to pharmacy and to contact Dr Tarri Glenn for that refill

## 2022-04-17 NOTE — Telephone Encounter (Signed)
Patient has called back.  He is requesting:   Potassium Chloride, sucralfate (2x day), levothyroxine, metoprolol succinate, montelukast, allopurinol and eliquis.

## 2022-04-20 DIAGNOSIS — G4733 Obstructive sleep apnea (adult) (pediatric): Secondary | ICD-10-CM | POA: Diagnosis not present

## 2022-04-22 ENCOUNTER — Telehealth: Payer: Self-pay | Admitting: Gastroenterology

## 2022-04-22 ENCOUNTER — Other Ambulatory Visit: Payer: Self-pay

## 2022-04-22 DIAGNOSIS — R1013 Epigastric pain: Secondary | ICD-10-CM

## 2022-04-22 MED ORDER — SUCRALFATE 1 G PO TABS: 1.0000 g | ORAL_TABLET | Freq: Two times a day (BID) | ORAL | 2 refills | Status: DC

## 2022-04-22 NOTE — Telephone Encounter (Signed)
Patient called stating sucralfate needs a prior authorization.  The pharmacy he uses is [facility] in [place].  Thank you.

## 2022-04-22 NOTE — Progress Notes (Unsigned)
04/22/2022 Sean [name] [id number] Apr 20, 1971   Chief Complaint:  History of Present Illness: Sean [name] is a 51 year old male with a past medical history of obesity, hypertension, hyperlipidemia, diastolic and systolic CHF, coronary artery disease s/p 1 vessel CABG 2017, paroxysmal atrial flutter on Eliquis, CKD, OSA uses CPAP, hepatic steatosis, thrombocytopenia and tubular adenomatous colon polyps  He was seen in office by [name] 03/04/2022 for further evaluation regarding nausea, vomiting and epigastric pain.RUQ sono showed hepatic steatosis.   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 ? Hepatology eval in [place]? FIBROSURE?   Seen by hematologist [name] 03/24/2022.  A 52 y.o. male with thrombocytopenia.  His platelet count today is mildly lower than what it has been in the past.  We will get in his peripheral counts, his white cells and platelets also slightly lower than what they have been in the past.  Overall, my concern is this gentleman may have underlying fatty liver disease and splenomegaly, which can lead to progressive cytopenias over time.  Despite his platelet count being low, he still has more than enough platelets to do the clotting his body needs.  I will see him back in 4 months for repeat clinical assessment. .The patient understands all the plans discussed today and is in agreement with them.       EGD 01/2021: Gastric intestinal metaplasia:  Repeat EGD recommended for gastric mapping 2025.   Colonoscopy 01/2021: History of tubular adenoma with incomplete colonoscopy 2022: Consider repeat trial at colonoscopy +/- CT colonography 2027.   Suspected underlying liver disease given thrombocytopenia.  Now with progressive thrombocytopenia.  Elastography 2022 showed fatty liver but no advanced fibrosis, however, the study was limited in accuracy due to body habitus. Plan FibroSURE for additional evaluation now.  Referral to  hematology, as well, given the additional decline on recent labs.     Latest Ref Rng & Units 03/24/2022   12:00 AM 03/03/2022    3:29 PM 12/06/2021    3:45 PM  CBC  WBC  4.4      4.5   5.6    Hemoglobin 13.5 - 17.5 12.9      13.6   13.3    Hematocrit 41 - 53 40      39.2   38.6    Platelets 150 - 400 K/uL 52      46.0 Repeated and verified X2.   66       This result is from an external source.         Latest Ref Rng & Units 03/24/2022   12:00 AM 03/19/2022    9:10 AM 03/10/2022   12:53 PM  CMP  Glucose 70 - 99 mg/dL  124     BUN 4 - '21 25      20     '$ Creatinine 0.6 - 1.3 2.1      1.97     Sodium 137 - 147 142      140     Potassium 3.5 - 5.1 mEq/L 3.9      4.3     Chloride 99 - 108 108      107     CO2 13 - '22 31      28     '$ Calcium 8.7 - 10.7 8.5      9.0     Total Protein 6.0 - 8.3 g/dL  6.3   6.4  Total Bilirubin 0.2 - 1.2 mg/dL  0.8   0.9    Alkaline Phos 25 - 125 192      169   167    AST 14 - 40 47      30   39    ALT 10 - 40 U/L 51      36   49       This result is from an external source.     RUQ sono 04/07/2022: 1. Sonographic evaluation of the abdomen limited due to patient body habitus. 2. Diffuse increased echogenicity of the hepatic parenchyma is a nonspecific indicator of hepatocellular dysfunction, most commonly steatosis. 3. Nonvisualization of the right kidney.  Virtual colonoscopy 5.23.2022: The patient was given a standard LoSo bowel preparation with Gastrografin and barium for fluid and stool tagging respectively. The quality of the bowel preparation is excellent with scattered retained fluid. Automated CO2 insufflation of the colon was performed prior to image acquisition and colonic distention is generally excellent. Image post processing was used to generate a 3D endoluminal fly-through projection of the colon and to electronically subtract stool/fluid as appropriate.   COMPARISON:  MR abdomen 02/02/2021.   VIRTUAL COLONOSCOPY 04/22/2022:  No  polyp, mass or stricture.  Virtual colonoscopy is not designed to detect diminutive polyps (i.e., less than or equal to 5 mm), the presence or absence of which may not affect clinical management   Abdominal MRI 02/02/2021: 1. Limited evaluation due to patient's inability to incomplete all sequences in the study protocol, secondary to claustrophobia with motion artifact on all quadrant sequences reducing exam sensitivity and specificity. Within the context of a significantly limited study the hepatic parenchyma is grossly unremarkable. 2. If there is continued clinical concern and plan for repeat MRI imaging for further assessment, I would recommend premedication prior to repeat imaging evaluation  EGD 02/19/2021: - Normal esophagus. Biopsied. - Erosive gastropathy with no bleeding and no stigmata of recent bleeding. Biopsied. - One gastric polyp. Biopsied. - Erythematous duodenopathy. Biopsied.  Colonoscopy 02/19/2021: The perianal and digital rectal examinations were normal. A 3 mm polyp was found in the transverse colon. The polyp was sessile. The polyp was removed with a cold snare. Resection and retrieval were complete. Estimated blood loss was minimal. The proximal colon and cecum were not evaluated. The exam was otherwise without abnormality on direct and retroflexion views. - One 3 mm polyp in the transverse colon, removed with a cold snare. Resected and Short interval colonoscopy depending on pathology results. - Consider CT colonography given the incompletely evaluated right colon.  A. DUODENUM, BIOPSY:  -  Benign duodenal mucosa with focal villous blunting  -  No acute inflammation or increased intraepithelial lymphocytes  identified   B. GASTRIC, RANDOM, FUNDUS, ANTRUM, BODY, BIOPSY:  -  Reactive gastropathy with intestinal metaplasia and patchy chronic  inflammation  -  Focal features consistent with proton pump inhibitor effect  -  No H. pylori identified  -  See  comment   C. GASTRIC, POLYPECTOMY:  -  Hyperplastic gastric polyp(s)  -  No H. pylori, intestinal metaplasia or malignancy identified   D. ESOPHAGUS, DISTAL, BIOPSY:  -  Benign squamous mucosa  -  No increased intraepithelial eosinophils   E. ESOPHAGUS, MID PROXIMAL, BIOPSY:  -  Benign squamous mucosa  -  No increased intraepithelial eosinophils   F. COLON, TRANSVERSE, POLYPECTOMY:  -  Tubular adenoma (1 of 1 fragments)  -  No high-grade dysplasia or malignancy identified   (  2) EGD in 3 years to monitor intestinal metaplasia, earlier with a family history of gastric cancer (3) CT colonography to further evaluate the right colon  Cardiac catheterization 11/02/2020: 1.  Severe single-vessel coronary artery disease with total occlusion of the ostial LAD 2.  Widely patent left main, left circumflex, and RCA with no significant stenoses 3.  Patent LIMA to LAD with no significant stenosis present 4.  Normal LVEDP   Current Medications, Allergies, Past Medical History, Past Surgical History, Family History and Social History were reviewed in [employer] record.   Review of Systems:   Constitutional: Negative for fever, sweats, chills or weight loss.  Respiratory: Negative for shortness of breath.   Cardiovascular: Negative for chest pain, palpitations and leg swelling.  Gastrointestinal: See HPI.  Musculoskeletal: Negative for back pain or muscle aches.  Neurological: Negative for dizziness, headaches or paresthesias.    Physical Exam: There were no vitals taken for this visit. General: Well developed, w   ***male in no acute distress. Head: Normocephalic and atraumatic. Eyes: No scleral icterus. Conjunctiva pink . Ears: Normal auditory acuity. Mouth: Dentition intact. No ulcers or lesions.  Lungs: Clear throughout to auscultation. Heart: Regular rate and rhythm, no murmur. Abdomen: Soft, nontender and nondistended. No masses or hepatomegaly. Normal  bowel sounds x 4 quadrants.  Rectal: *** Musculoskeletal: Symmetrical with no gross deformities. Extremities: No edema. Neurological: Alert oriented x 4. No focal deficits.  Psychological: Alert and cooperative. Normal mood and affect  Assessment and Recommendations:  Nausea/vomiting and epigastric pain. On Pantoprazole 40 mg twice daily, Famotidine 20 mg nightly and Carafate 1 g 2-4 times daily.  Elevated LFTs. 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  -ANA, SMA, AMA, IgG, Hep B surface antigen, Hep B surface antibody, Hep B core total antibody, Hep C total antibody, Hep A total antibody. Ceruloplasmin and A1AT,  -Consider future liver biopsy if the above lab results inconclusive   Thrombocytopenia secondary to liver disease (suspect NASH at risk for cirrhosis). Abdominal sonogram without evidence of splenomegaly.  -See plan in # 2  Colon cancer screening CT colonography 2027   CAD  CHF. LE edema. -Recommend follow up with cardiology, repeat ECHO  CKD stage III -Recommend nephrology consult

## 2022-04-22 NOTE — Telephone Encounter (Signed)
Rx had never been sent to pt preferred pharmacy as below:  Outpatient Medication Detail   Disp Refills Start End   sucralfate (CARAFATE) 1 g tablet  60 tablet 2 02/26/2022 04/22/2022   Sig: Take 1g po BID. May increase to QID for acute symptoms   Class: No Print

## 2022-04-22 NOTE — Telephone Encounter (Signed)
Outpatient Medication Detail   Disp Refills Start End   sucralfate (CARAFATE) 1 g tablet 60 tablet 2 04/22/2022    Sig - Route: Take 1 tablet (1 g total) by mouth 2 (two) times daily. May increase to QID for any acute symptoms - Oral   Sent to pharmacy as: sucralfate (CARAFATE) 1 g tablet   E-Prescribing Status: Receipt confirmed by pharmacy (04/22/2022  3:03 PM EDT)    Rx has now been sent to pt preferred pharmacy. Given that the Rx had not been sent, will await response from pharmacy to determine if PA truly required.

## 2022-04-24 ENCOUNTER — Ambulatory Visit (INDEPENDENT_AMBULATORY_CARE_PROVIDER_SITE_OTHER): Payer: BC Managed Care – PPO | Admitting: Nurse Practitioner

## 2022-04-24 ENCOUNTER — Encounter: Payer: Self-pay | Admitting: Nurse Practitioner

## 2022-04-24 ENCOUNTER — Other Ambulatory Visit (INDEPENDENT_AMBULATORY_CARE_PROVIDER_SITE_OTHER): Payer: BC Managed Care – PPO

## 2022-04-24 VITALS — BP 136/70 | HR 56 | Ht 71.0 in | Wt 386.0 lb

## 2022-04-24 DIAGNOSIS — R112 Nausea with vomiting, unspecified: Secondary | ICD-10-CM

## 2022-04-24 DIAGNOSIS — D649 Anemia, unspecified: Secondary | ICD-10-CM

## 2022-04-24 DIAGNOSIS — I509 Heart failure, unspecified: Secondary | ICD-10-CM

## 2022-04-24 DIAGNOSIS — R748 Abnormal levels of other serum enzymes: Secondary | ICD-10-CM | POA: Diagnosis not present

## 2022-04-24 DIAGNOSIS — K920 Hematemesis: Secondary | ICD-10-CM | POA: Diagnosis not present

## 2022-04-24 LAB — COMPREHENSIVE METABOLIC PANEL
ALT: 55 U/L — ABNORMAL HIGH (ref 0–53)
AST: 36 U/L (ref 0–37)
Albumin: 3.5 g/dL (ref 3.5–5.2)
Alkaline Phosphatase: 180 U/L — ABNORMAL HIGH (ref 39–117)
BUN: 29 mg/dL — ABNORMAL HIGH (ref 6–23)
CO2: 28 mEq/L (ref 19–32)
Calcium: 9.5 mg/dL (ref 8.4–10.5)
Chloride: 108 mEq/L (ref 96–112)
Creatinine, Ser: 1.98 mg/dL — ABNORMAL HIGH (ref 0.40–1.50)
GFR: 38.98 mL/min — ABNORMAL LOW (ref >60.00)
Glucose, Bld: 95 mg/dL (ref 70–99)
Potassium: 4.2 mEq/L (ref 3.5–5.1)
Sodium: 143 mEq/L (ref 135–145)
Total Bilirubin: 1 mg/dL (ref 0.2–1.2)
Total Protein: 6.5 g/dL (ref 6.0–8.3)

## 2022-04-24 LAB — GAMMA GT: GGT: 302 U/L — ABNORMAL HIGH (ref 7–51)

## 2022-04-24 LAB — CBC
HCT: 41.6 % (ref 39.0–52.0)
Hemoglobin: 14.1 g/dL (ref 13.0–17.0)
MCHC: 33.9 g/dL (ref 30.0–36.0)
MCV: 101 fl — ABNORMAL HIGH (ref 78.0–100.0)
Platelets: 52 10*3/uL — ABNORMAL LOW (ref 150.0–400.0)
RBC: 4.12 Mil/uL — ABNORMAL LOW (ref 4.22–5.81)
RDW: 13.8 % (ref 11.5–15.5)
WBC: 4.4 10*3/uL (ref 4.0–10.5)

## 2022-04-24 LAB — PROTIME-INR
INR: 1.2 ratio — ABNORMAL HIGH (ref 0.8–1.0)
Prothrombin Time: 12.6 s (ref 9.6–13.1)

## 2022-04-24 NOTE — Patient Instructions (Signed)
Your provider has requested that you go to the basement level for lab work before leaving today. Press "B" on the elevator. The lab is located at the first door on the left as you exit the elevator.  Due to recent changes in healthcare laws, you may see the results of your imaging and laboratory studies on MyChart before your provider has had a chance to review them.  We understand that in some cases there may be results that are confusing or concerning to you. Not all laboratory results come back in the same time frame and the provider may be waiting for multiple results in order to interpret others.  Please give [place] 48 hours in order for your provider to thoroughly review all the results before contacting the office for clarification of your results.   You will be contacted by [facility] in the next 2 days to arrange a Gastric Emptying Scan.  The number on your caller ID will be [phone], please answer when they call.  If you have not heard from them in 2 days please call [phone] to schedule.     Continue your famotidine '20mg'$  one twice daily. Continue your carafate one three times a day. Continue your over the counter omeprazole twice a day.  I appreciate the opportunity to care for you. Carl Best, CRNP

## 2022-04-29 NOTE — Progress Notes (Signed)
  Sean [name], pls contact patient ane let him know [name] recommends scheduling him for a repeat EGD. Pls schedule him for an EGD at [facility] with [name]. BM > 50. Pls contact his cardiologist for Eliquis hold instructions prior to proceeding with an EGD. THX.    Note response per [name]: [place], MD  Noralyn Pick, NP Reviewed and agree with management plans. Will proceed with EGD for further evaluation.   Kimberly L. Tarri Glenn, MD, MPH

## 2022-04-30 ENCOUNTER — Telehealth: Payer: Self-pay | Admitting: Internal Medicine

## 2022-04-30 NOTE — Telephone Encounter (Signed)
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  Pt expresses that [facility] is requesting that an echo be performed in order to renew DOT.  I advised pt that a stress test was performed in Nov 2022 and this should be sufficient.  Pt asked that I call the office to see what testing is needed. I called and was told that pt will need a medical clearance letter and EF.  I have written medical clearance based on MD results from Nov 2022 stress test results. These results along with clearance letter were faxed to [facility] [phone] confirmation received.  I called pt and notified of above.  Pt had no questions or concerns.

## 2022-04-30 NOTE — Telephone Encounter (Signed)
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 He needs to have the echo done ASAP. Please order so patient can continue to work

## 2022-05-02 ENCOUNTER — Telehealth: Payer: Self-pay

## 2022-05-02 ENCOUNTER — Other Ambulatory Visit: Payer: Self-pay

## 2022-05-02 LAB — ALPHA-1-ANTITRYPSIN: A-1 Antitrypsin, Ser: 134 mg/dL (ref 83–199)

## 2022-05-02 LAB — IGG: IgG (Immunoglobin G), Serum: 569 mg/dL — ABNORMAL LOW (ref 600–1640)

## 2022-05-02 LAB — ANTI-NUCLEAR AB-TITER (ANA TITER): ANA Titer 1: 1:40 {titer} — ABNORMAL HIGH

## 2022-05-02 LAB — ANTI-SMOOTH MUSCLE ANTIBODY, IGG: Actin (Smooth Muscle) Antibody (IGG): <20 U (ref <20)

## 2022-05-02 LAB — HEPATITIS B SURFACE ANTIGEN: Hepatitis B Surface Ag: NONREACTIVE

## 2022-05-02 LAB — HEPATITIS A ANTIBODY, TOTAL: Hepatitis A AB,Total: NONREACTIVE

## 2022-05-02 LAB — HEPATITIS B SURFACE ANTIBODY,QUALITATIVE: Hep B S Ab: NONREACTIVE

## 2022-05-02 LAB — CERULOPLASMIN: Ceruloplasmin: 27 mg/dL (ref 18–36)

## 2022-05-02 LAB — HEPATITIS B CORE ANTIBODY, TOTAL: Hep B Core Total Ab: NONREACTIVE

## 2022-05-02 LAB — HEPATITIS C ANTIBODY
Hepatitis C Ab: NONREACTIVE
SIGNAL TO CUT-OFF: 0.07 (ref <1.00)

## 2022-05-02 LAB — MITOCHONDRIAL ANTIBODIES: Mitochondrial M2 Ab, IgG: <=20 U (ref <=20.0)

## 2022-05-02 LAB — ANA: Anti Nuclear Antibody (ANA): POSITIVE — AB

## 2022-05-02 NOTE — Telephone Encounter (Signed)
Called to set up EGD with [name] at [facility], but patient was in a doctor's appointment. I ask that he call back this number when he has time later today.

## 2022-05-05 ENCOUNTER — Other Ambulatory Visit: Payer: Self-pay

## 2022-05-05 DIAGNOSIS — R748 Abnormal levels of other serum enzymes: Secondary | ICD-10-CM

## 2022-05-05 DIAGNOSIS — R112 Nausea with vomiting, unspecified: Secondary | ICD-10-CM

## 2022-05-05 NOTE — Telephone Encounter (Signed)
Patient has been scheduled for EGD at [facility] with [name] on 07/07/22 at 9:45 am. Pt is aware. He is currently on Eliquis, and a clearance note has been sent to [facility]. Pre visit scheduled for 06/23/22 at 9:00 am. Amb ref & hospital orders have been placed.

## 2022-05-05 NOTE — Telephone Encounter (Signed)
[facility] Pre-operative Risk Assessment     Request for surgical clearance:     Endoscopy Procedure  What type of surgery is being performed?     EGD  When is this surgery scheduled?     07/07/22  What type of clearance is required ?   Pharmacy  Are there any medications that need to be held prior to surgery and how long? Eliquis  Practice name and name of physician performing surgery?      [facility]  What is your office phone and fax number?      Phone- [phone]  [phone]  Anesthesia type (None, local, MAC, general) ?       MAC

## 2022-05-05 NOTE — Telephone Encounter (Signed)
Clinical pharmacist to review Eliquis 

## 2022-05-06 NOTE — Telephone Encounter (Addendum)
Patient with diagnosis of aflutter on Eliquis for anticoagulation.    Procedure: EGD Date of procedure: 07/07/22  CHA2DS2-VASc Score = 3  This indicates a 3.2% annual risk of stroke. The patient's score is based upon: CHF History: 1 HTN History: 1 Diabetes History: 0 Stroke History: 0 Vascular Disease History: 1 Age Score: 0 Gender Score: 0   CrCl 31m/min using adjusted body weight due to obesity Platelet count 52K  Per office protocol, patient can hold Eliquis for 2 days prior to procedure.

## 2022-05-21 DIAGNOSIS — G4733 Obstructive sleep apnea (adult) (pediatric): Secondary | ICD-10-CM | POA: Diagnosis not present

## 2022-05-23 ENCOUNTER — Telehealth: Payer: Self-pay

## 2022-05-29 IMAGING — US US ABDOMEN COMPLETE
1 series · 14 of 25 positions shown · non-contrast
Comparison: None Available.

CLINICAL DATA: Epigastric pain

Elevated liver function test
EXAM:
ABDOMEN ULTRASOUND COMPLETE

[Series 1: us abdomen complete · 14 of 43 slices shown]
[im 1/43]
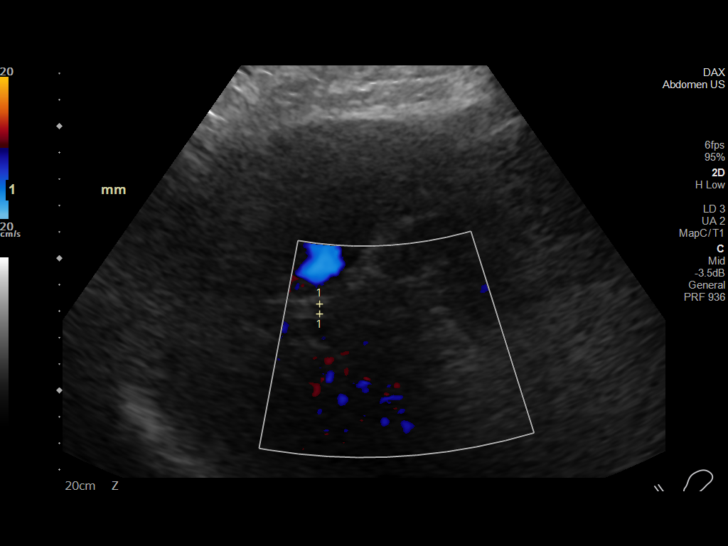
[im 4/43]
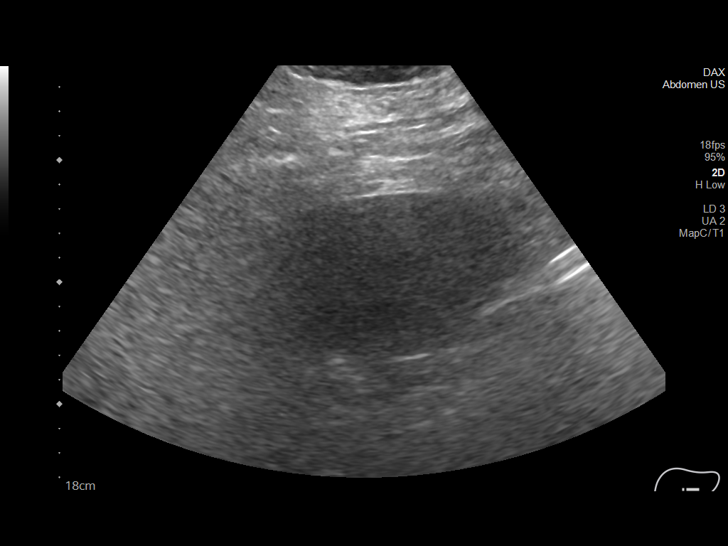
[im 8/43]
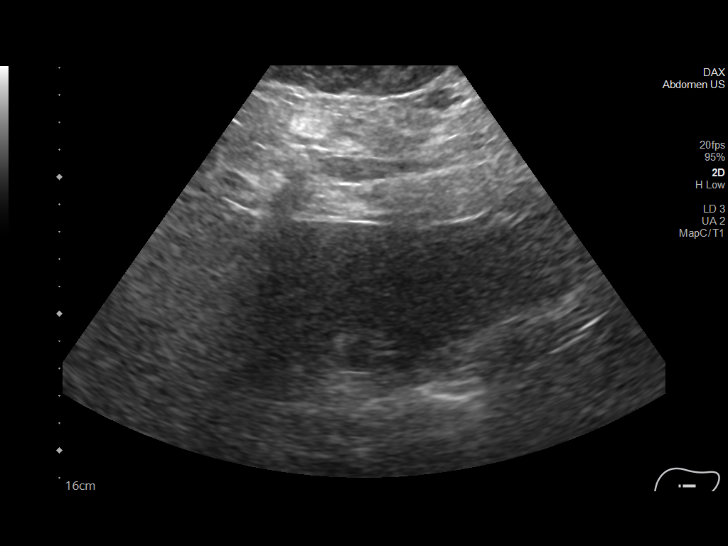
[im 11/43]
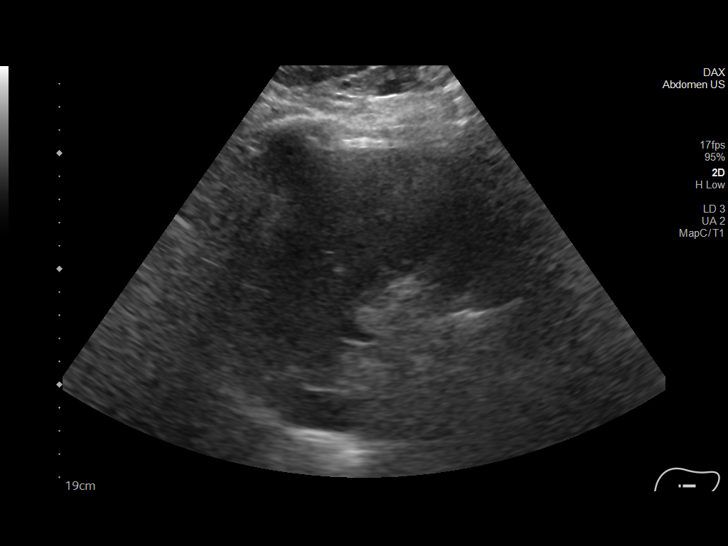
[im 15/43]
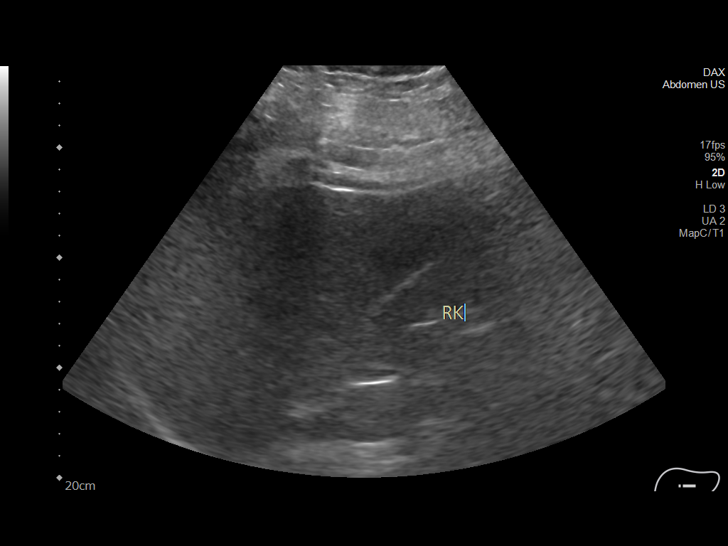
[im 16/43]
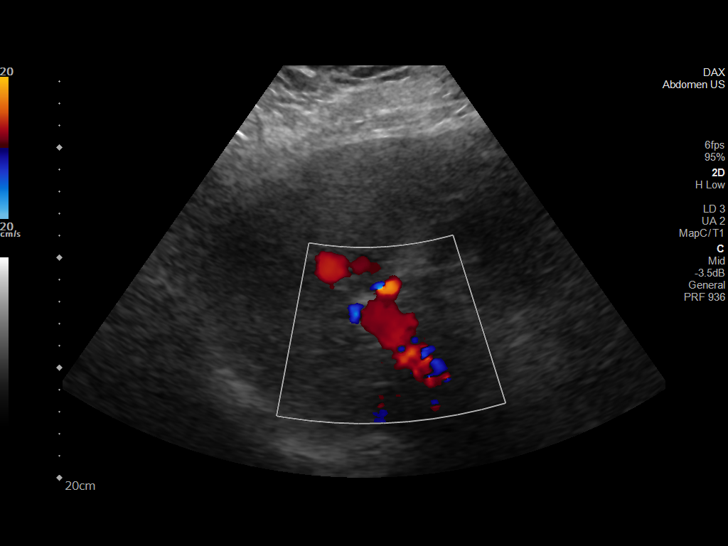
[im 20/43]
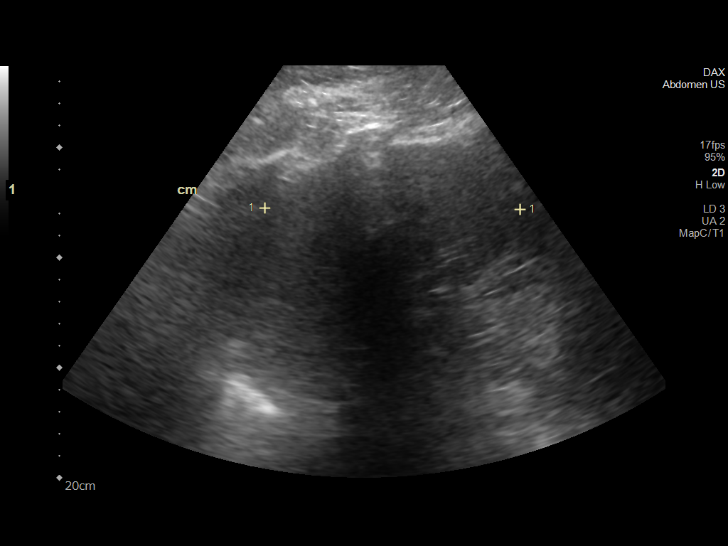
[im 23/43]
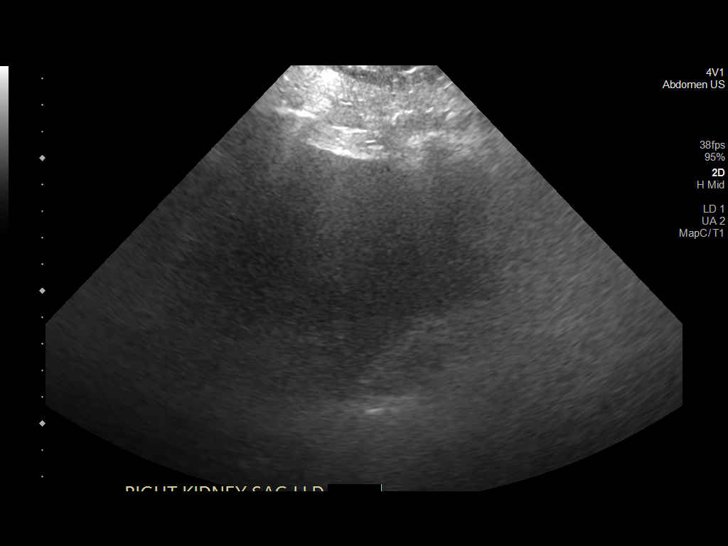
[im 27/43]
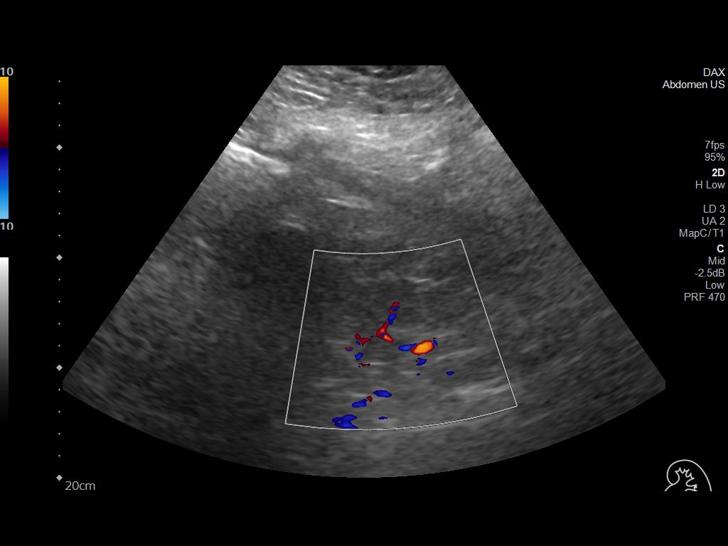
[im 29/43]
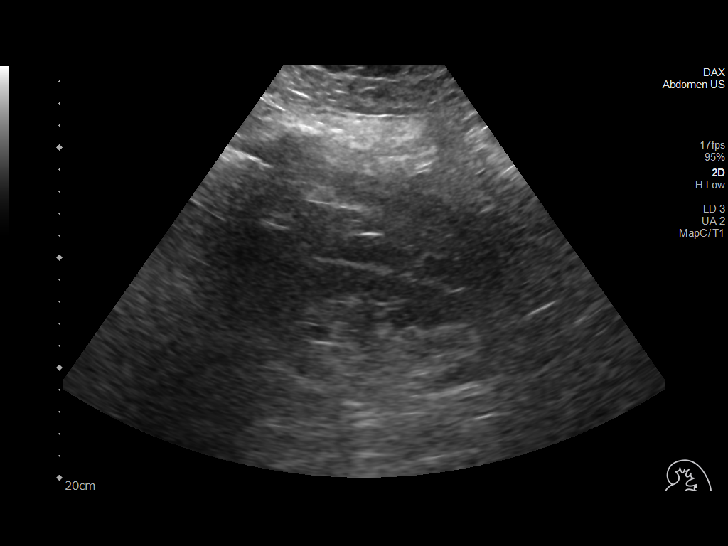
[im 32/43]
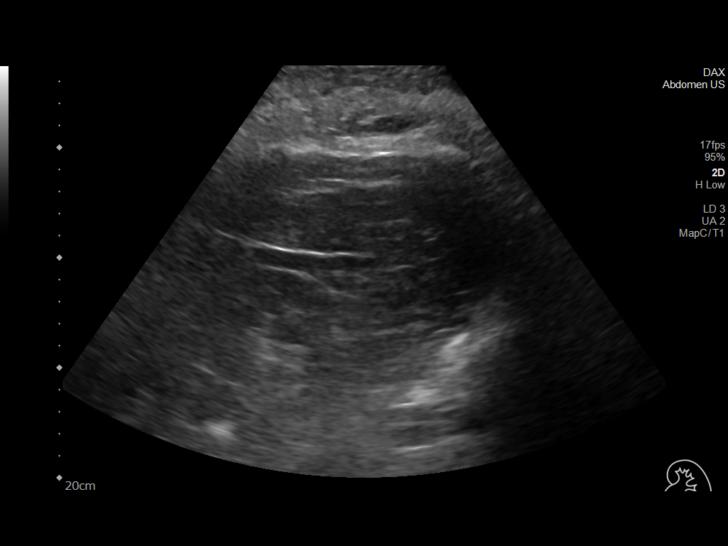
[im 36/43]
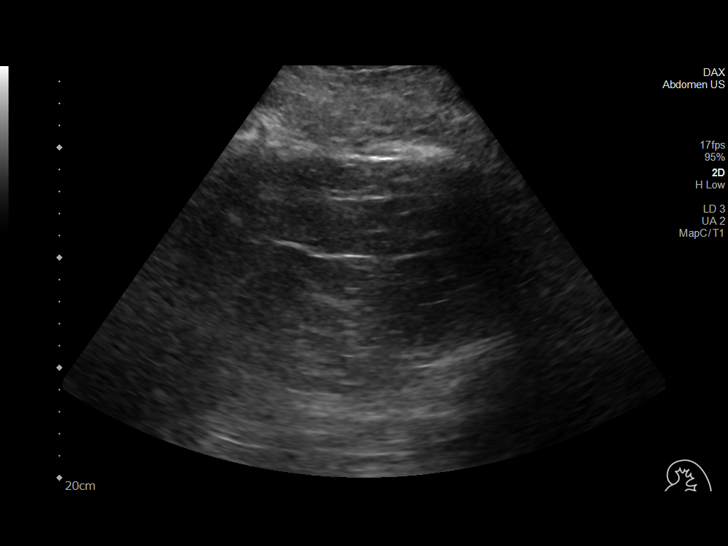
[im 39/43]
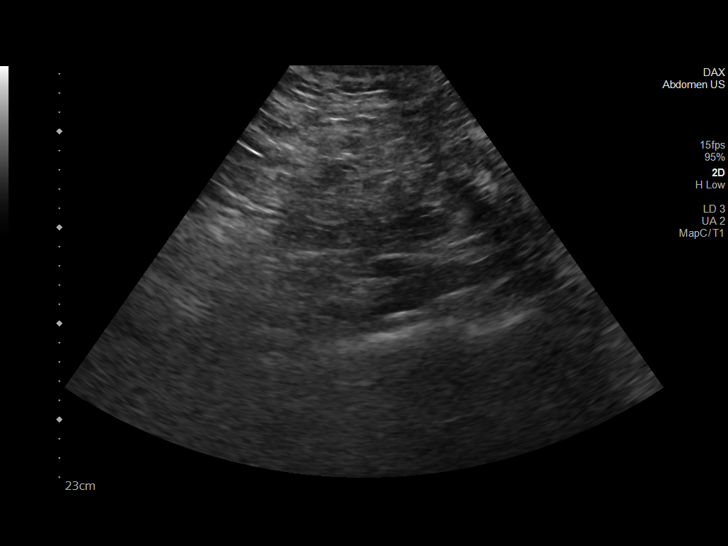
[im 43/43]
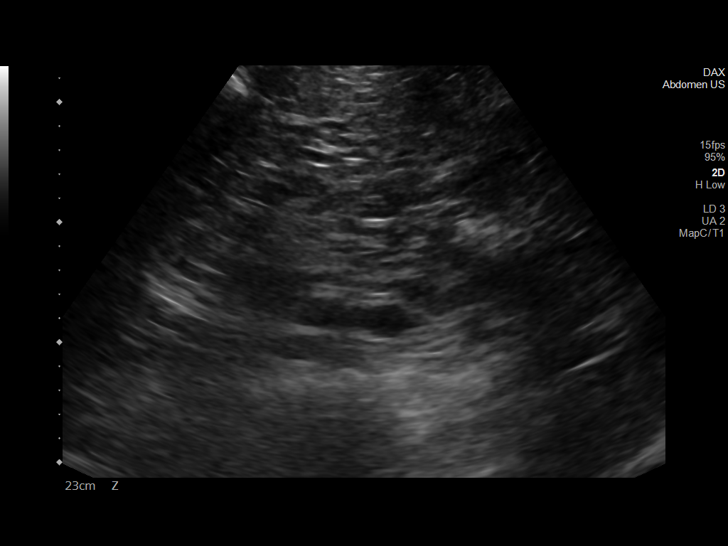

[14 of 25 positions shown; findings below may reference images not displayed]

FINDINGS: Gallbladder: Surgically absent

Common bile duct: Diameter: 4 mm

Liver:

No focal lesion.

Diffusely increased parenchymal echogenicity.

Portal vein is patent on color Doppler imaging with normal direction
of blood flow towards the liver.

IVC: No abnormality visualized.

Pancreas: Not well visualized.

Spleen: Size and appearance within normal limits.

Right Kidney: Unable to visualize

Left Kidney: Length: 11.6 cm. Echogenicity within normal limits. No
mass or hydronephrosis visualized.

Abdominal aorta: No aneurysm visualized.

Other findings: None.
IMPRESSION: 1. Sonographic evaluation of the abdomen limited due to patient body
habitus.
2. Diffuse increased echogenicity of the hepatic parenchyma is a
nonspecific indicator of hepatocellular dysfunction, most commonly
steatosis.
3. Nonvisualization of the right kidney.

## 2022-06-18 ENCOUNTER — Ambulatory Visit: Payer: BC Managed Care – PPO | Admitting: Physician Assistant

## 2022-06-20 DIAGNOSIS — G4733 Obstructive sleep apnea (adult) (pediatric): Secondary | ICD-10-CM | POA: Diagnosis not present

## 2022-06-23 ENCOUNTER — Telehealth: Payer: Self-pay

## 2022-06-23 NOTE — Telephone Encounter (Signed)
Patient was scheduled for  PV today at 9:00 Am. Patient no showed for his appointment. I called to reschedule his PV appointment and the patient states that he wanted to cancel his endoscopy that is scheduled for [facility] on 07/07/22 @ 9:45 Am. I offered to do his PV over the phone because he told me that he started a new job and couldn't take off of work.  Patient states he wanted to cancel his procedure. I will let [name] nurse know so that it can be cancelled.

## 2022-06-24 ENCOUNTER — Telehealth: Payer: Self-pay

## 2022-06-24 ENCOUNTER — Other Ambulatory Visit: Payer: Self-pay | Admitting: Gastroenterology

## 2022-06-24 ENCOUNTER — Other Ambulatory Visit: Payer: Self-pay | Admitting: *Deleted

## 2022-06-24 DIAGNOSIS — R1013 Epigastric pain: Secondary | ICD-10-CM

## 2022-06-24 MED ORDER — DILTIAZEM HCL ER 120 MG PO CP12: 120.0000 mg | ORAL_CAPSULE | Freq: Two times a day (BID) | ORAL | 2 refills | Status: DC

## 2022-06-24 NOTE — Telephone Encounter (Signed)
Called & confirmed with patient that he does want to cancel EGD that is currently scheduled at [facility] on 8/7 with [name]. He states he just started a new job & does not have the availability to take off. He would prefer to discuss it at his next OV.

## 2022-06-25 ENCOUNTER — Encounter: Payer: Self-pay | Admitting: Physician Assistant

## 2022-06-25 ENCOUNTER — Ambulatory Visit (INDEPENDENT_AMBULATORY_CARE_PROVIDER_SITE_OTHER): Payer: BC Managed Care – PPO | Admitting: Physician Assistant

## 2022-06-25 VITALS — BP 124/60 | HR 59 | Temp 98.1°F | Ht 71.0 in | Wt 392.2 lb

## 2022-06-25 DIAGNOSIS — E039 Hypothyroidism, unspecified: Secondary | ICD-10-CM

## 2022-06-25 DIAGNOSIS — Z23 Encounter for immunization: Secondary | ICD-10-CM | POA: Diagnosis not present

## 2022-06-25 DIAGNOSIS — I10 Essential (primary) hypertension: Secondary | ICD-10-CM

## 2022-06-25 DIAGNOSIS — R1013 Epigastric pain: Secondary | ICD-10-CM | POA: Diagnosis not present

## 2022-06-25 NOTE — Patient Instructions (Signed)
It was great to see you!  Hep B vaccine given today --> follow-up in 1 month for second dose (NURSE VISIT ONLY)  Please start taking daily thyroid medication  Please start daily claritin/allegra/zyrtec (generic is fine!) to replace your benadryl  Follow-up in 3 months to recheck your thyroid.  Take care,  [name] PA-C

## 2022-06-25 NOTE — Progress Notes (Signed)
Sean [name] is a 51 y.o. male here for a follow up on hypertension.   History of Present Illness:   Chief Complaint  Patient presents with   Hypertension    HPI  HTN Currently taking Toprol XL 25 mg daily, Diltiazem 120 mg TID daily and Lasix 40 mg as needed. He is tolerating this well and denies any adverse side effects. At home blood pressure readings are: not checked. Patient denies chest pain, SOB, blurred vision, dizziness, unusual headaches, lower leg swelling. Patient is  compliant with medication. Denies excessive caffeine intake, stimulant usage, excessive alcohol intake, or increase in salt consumption.  BP Readings from Last 3 Encounters:  06/25/22 124/60  04/24/22 136/70  03/24/22 (!) 177/88    Hypothyroidism Patient states he has not been compliant with Synthroid 50 mg daily. States he leaves early for his work and forget to take his medication. Denies any other concerns.   Epigastric Pain  Patient has had issue with epigastric pain in the past but this has improved. He is currently taking Carafate 1 g twice daily, Pepcid 20 mg TID daily and Protonix 40 mg daily. Feels like his pain is mostly related to sinus problem. He is managing well. Patient states his GI wanted him to have another endoscopy. Last done in march 2nd 2022.  Denies any other concerning sx.  Past Medical History:  Diagnosis Date   Abnormal liver enzymes    a. Sees a doctor in [place].   Cardiac cirrhosis    a. possible elevated LFTs/low platelets felt due to cardiac cirrhosis per 2017 admission.   Chronic combined systolic and diastolic CHF (congestive heart failure) (HCC)    CKD (chronic kidney disease), stage II    Congenital heart defect    a. rightward rotation of heart, almost dextrocardia   Coronary artery disease    a. s/p CABGx1 in 12/2015.   Hematuria    a. Chronic hx of this, no prior etiology determined through workup per patient.   Hypercholesteremia    a. Prev taken off  statin due to abnormal liver function.   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   LIMA to LAD   Sinus bradycardia    Thrombocytopenia (HCC)      Social History   Tobacco Use   Smoking status: Never   Smokeless tobacco: Never  Vaping Use   Vaping Use: Never used  Substance Use Topics   Alcohol use: Yes    Comment: 6 beers per month   Drug use: No    Past Surgical History:  Procedure Laterality Date   APPENDECTOMY     BIOPSY  02/19/2021   Procedure: BIOPSY;  Surgeon: Thornton Park, MD;  Location: [facility];  Service: Gastroenterology;;   CARDIAC CATHETERIZATION N/A 12/24/2015   Procedure: Right/Left Heart Cath and Coronary Angiography;  Surgeon: Jolaine Artist, MD;  Location: [facility];  Service: Cardiovascular;  Laterality: N/A;   CARDIOVERSION N/A 02/14/2016   Procedure: CARDIOVERSION;  Surgeon: Pixie Casino, MD;  Location: [facility] ENDOSCOPY;  Service: Cardiovascular;  Laterality: N/A;   COLONOSCOPY WITH PROPOFOL N/A 02/19/2021   Procedure: COLONOSCOPY WITH PROPOFOL;  Surgeon: Thornton Park, MD;  Location: [facility];  Service: Gastroenterology;  Laterality: N/A;   CORONARY ARTERY BYPASS GRAFT N/A 12/26/2015   Procedure: Off Pump Coronary Artery Bypass Grafting times one using left internal  mammary artery;  Surgeon: Rexene Alberts, MD;  Location: [facility];  Service: Open Heart Surgery;  Laterality: N/A;   ESOPHAGOGASTRODUODENOSCOPY (EGD) WITH PROPOFOL N/A 02/19/2021   Procedure: ESOPHAGOGASTRODUODENOSCOPY (EGD) WITH PROPOFOL;  Surgeon: Thornton Park, MD;  Location: [facility];  Service: Gastroenterology;  Laterality: N/A;   GALLBLADDER SURGERY     HERNIA REPAIR     LEFT HEART CATH AND CORS/GRAFTS ANGIOGRAPHY N/A 04/15/2017   Procedure: Left Heart Cath and Cors/Grafts Angiography;  Surgeon: Troy Sine, MD;  Location: [facility];  Service:  Cardiovascular;  Laterality: N/A;   LEFT HEART CATH AND CORS/GRAFTS ANGIOGRAPHY N/A 11/02/2020   Procedure: LEFT HEART CATH AND CORS/GRAFTS ANGIOGRAPHY;  Surgeon: Sherren Mocha, MD;  Location: [facility];  Service: Cardiovascular;  Laterality: N/A;   POLYPECTOMY  02/19/2021   Procedure: POLYPECTOMY;  Surgeon: Thornton Park, MD;  Location: [facility];  Service: Gastroenterology;;   TEE WITHOUT CARDIOVERSION N/A 12/26/2015   Procedure: TRANSESOPHAGEAL ECHOCARDIOGRAM (TEE);  Surgeon: Rexene Alberts, MD;  Location: [facility];  Service: Open Heart Surgery;  Laterality: N/A;   TEE WITHOUT CARDIOVERSION N/A 02/14/2016   Procedure: TRANSESOPHAGEAL ECHOCARDIOGRAM (TEE);  Surgeon: Pixie Casino, MD;  Location: [facility] ENDOSCOPY;  Service: Cardiovascular;  Laterality: N/A;    Family History  Problem Relation Age of Onset   CAD Father        2 stents ~ 54   Diabetes Father    Heart attack Other    Hyperlipidemia Other    Diabetes Maternal Grandfather    Diabetes Maternal Uncle     Allergies  Allergen Reactions   Doxycycline Shortness Of Breath   Imdur [Isosorbide Dinitrate] Diarrhea    Pt states causes "diarrhea."   Augmentin [Amoxicillin-Pot Clavulanate] Itching   Tape Rash    Current Medications:   Current Outpatient Medications:    acetaminophen (TYLENOL) 500 MG tablet, Take 500-1,500 mg by mouth as needed for moderate pain, fever, headache or mild pain., Disp: , Rfl:    allopurinol (ZYLOPRIM) 100 MG tablet, Take 1 tablet (100 mg total) by mouth daily., Disp: 90 tablet, Rfl: 1   apixaban (ELIQUIS) 5 MG TABS tablet, Take 1 tablet (5 mg total) by mouth 2 (two) times daily., Disp: 180 tablet, Rfl: 1   azelastine (ASTELIN) 0.1 % nasal spray, Place 2 sprays into both nostrils 2 (two) times daily. (Patient taking differently: Place 2 sprays into both nostrils as needed for rhinitis or allergies.), Disp: 30 mL, Rfl: 12   Cyanocobalamin (VITAMIN B-12) 5000 MCG SUBL, Place 5,000 mcg under  the tongue daily., Disp: , Rfl:    diltiazem (CARDIZEM SR) 120 MG 12 hr capsule, Take 1 capsule (120 mg total) by mouth 2 (two) times daily., Disp: 60 capsule, Rfl: 2   docusate sodium (STOOL SOFTENER) 100 MG capsule, Take 1 capsule (100 mg total) by mouth daily., Disp: 30 capsule, Rfl: 0   Evolocumab (REPATHA SURECLICK) 580 MG/ML SOAJ, INJECT THE CONTENTS OF 1 SYRINGE UNDER THE SKIN EVERY 14 DAYS, Disp: 6 mL, Rfl: 3   famotidine (PEPCID) 20 MG tablet, Take 1 tablet (20 mg total) by mouth 2 (two) times daily., Disp: 60 tablet, Rfl: 3   furosemide (LASIX) 40 MG tablet, Take 40 mg by mouth daily as needed for fluid., Disp: , Rfl:    ipratropium (ATROVENT) 0.06 % nasal spray, Place 2 sprays into both nostrils 4 (four) times daily., Disp: 15 mL, Rfl: 12   metoprolol succinate (TOPROL-XL) 25 MG 24 hr  tablet, Take 1 tablet (25 mg total) by mouth daily., Disp: 90 tablet, Rfl: 1   montelukast (SINGULAIR) 10 MG tablet, Take 1 tablet (10 mg total) by mouth daily., Disp: 90 tablet, Rfl: 1   nitroGLYCERIN (NITROSTAT) 0.4 MG SL tablet, Place 1 tablet (0.4 mg total) under the tongue every 5 (five) minutes as needed for chest pain., Disp: 25 tablet, Rfl: 6   pantoprazole (PROTONIX) 40 MG tablet, Take 1 tablet (40 mg total) by mouth 2 (two) times daily., Disp: 180 tablet, Rfl: 3   polyethylene glycol powder (GLYCOLAX/MIRALAX) 17 GM/SCOOP powder, Take 1 dose daily. May increase to BID if needed, Disp: 255 g, Rfl: 3   potassium chloride (KLOR-CON M) 10 MEQ tablet, Take 1 tablet (10 mEq total) by mouth daily., Disp: 90 tablet, Rfl: 1   sucralfate (CARAFATE) 1 g tablet, TAKE 1 TABLET(1 GRAM) BY MOUTH TWICE DAILY. MAY INCREASE TO 4 TIMES DAILY FOR ANY ACUTE SYMPTOMS, Disp: 60 tablet, Rfl: 2   levothyroxine (SYNTHROID) 50 MCG tablet, Take 1 tablet (50 mcg total) by mouth daily before breakfast. (Patient not taking: Reported on 06/25/2022), Disp: 90 tablet, Rfl: 1   Review of Systems:   ROS Negative unless otherwise  specified per HPI.   Vitals:   Vitals:   06/25/22 1604  BP: 124/60  Pulse: (!) 59  Temp: 98.1 F (36.7 C)  TempSrc: Temporal  SpO2: 96%  Weight: (!) 392 lb 4 oz (177.9 kg)  Height: '5\' 11"'$  (1.803 m)     Body mass index is 54.71 kg/m.  Physical Exam:   Physical Exam Vitals and nursing note reviewed.  Constitutional:      General: He is not in acute distress.    Appearance: He is well-developed. He is not ill-appearing or toxic-appearing.  Cardiovascular:     Rate and Rhythm: Normal rate and regular rhythm.     Pulses: Normal pulses.     Heart sounds: Normal heart sounds, S1 normal and S2 normal.  Pulmonary:     Effort: Pulmonary effort is normal.     Breath sounds: Normal breath sounds.  Skin:    General: Skin is warm and dry.  Neurological:     Mental Status: He is alert.     GCS: GCS eye subscore is 4. GCS verbal subscore is 5. GCS motor subscore is 6.  Psychiatric:        Speech: Speech normal.        Behavior: Behavior normal. Behavior is cooperative.     Assessment and Plan:   Essential hypertension Normotensive Continue Toprol XL 25 mg daily, Diltiazem 120 mg TID daily and Lasix 40 mg as needed  Acquired hypothyroidism Non-compliant Discussed importance of starting this medication and taking regularly Follow-up in 3 months to recheck blood work  Abdominal pain, epigastric Improving He has a new job and wants to delay EGD until he has been at new employer longer Continue current regimen Recommend starting daily 24-hour antihistamine instead of prn benadryl he is taking to help with congestion that he feels is contributing to his gastritis  Need for prophylactic vaccination and inoculation against viral hepatitis Updated today   I,Savera Zaman,acting as a scribe for [employer], PA.,have documented all relevant documentation on the behalf of [name], PA,as directed by  [name], PA while in the presence of [name], [place].    I, [name], [place], have reviewed all documentation for this visit. The documentation on 06/25/22 for the exam, diagnosis, procedures, and orders  are all accurate and complete.   [name], PA-C

## 2022-06-30 ENCOUNTER — Telehealth: Payer: Self-pay

## 2022-06-30 NOTE — Chronic Care Management (AMB) (Signed)
  Care Coordination  Note  06/30/2022 Name: Sean [name] MRN: [id number] DOB: 03-Mar-1971  Sean [name] is a 51 y.o. year old male who is a primary care patient of [name], [place]. I reached out to Sean [name] by phone today to offer care coordination services.      Sean [name] was given information about Care Coordination services today including:  The Care Coordination services include support from the care team which includes your Nurse Coordinator, Clinical Social Worker, or Pharmacist.  The Care Coordination team is here to help remove barriers to the health concerns and goals most important to you. Care Coordination services are voluntary and the patient may decline or stop services at any time by request to their care team member.   Patient agreed to services and verbal consent obtained.   Follow up plan: Telephone appointment with care coordination team member scheduled for:07/04/2022  Noreene Larsson, [place], [place] [address] Direct Dial: [phone] [email or web address]

## 2022-07-04 ENCOUNTER — Ambulatory Visit: Payer: Self-pay | Admitting: *Deleted

## 2022-07-04 ENCOUNTER — Other Ambulatory Visit: Payer: Self-pay | Admitting: Gastroenterology

## 2022-07-04 ENCOUNTER — Telehealth: Payer: Self-pay

## 2022-07-04 ENCOUNTER — Encounter: Payer: Self-pay | Admitting: *Deleted

## 2022-07-04 NOTE — Patient Instructions (Signed)
Visit Information  Thank you for taking time to visit with me today. Please don't hesitate to contact me if I can be of assistance to you.   Following are the goals we discussed today:   Goals Addressed               This Visit's Progress     "I need to lose weight and eat healthier" (pt-stated)        Care Coordination Interventions: Provided education to patient re: Health eating habits and how obese will affect his health Reviewed medications with patient and discussed purpose of all medications Provided patient and/or caregiver with wellness and weigh loss information about healthy weight programs or agencies  ([employer]) Provided patient with healthy food options educational materials related to weight loss Reviewed scheduled/upcoming provider appointments including all pending upcoming appointments Care Guide referral for Weight lost clinics and/or agencies near pt's area Discussed plans with patient for ongoing care management follow up and provided patient with direct contact information for care management team Screening for signs and symptoms of depression related to chronic disease state  Assessed social determinant of health barriers         Our next appointment is by telephone on 08/05/2022 at 10:00 AM  Please call the care guide team at [phone] if you need to cancel or reschedule your appointment.   If you are experiencing a Mental Health or [place] or need someone to talk to, please call the Suicide and Crisis Lifeline: 988  The patient verbalized understanding of instructions, educational materials, and care plan provided today and agreed to receive a mailed copy of patient instructions, educational materials, and care plan.   The patient has been provided with contact information for the care management team and has been advised to call with any health related questions or concerns.   Raina Mina, RN Care Management  Coordinator [place] Office [phone]

## 2022-07-04 NOTE — Patient Outreach (Signed)
  Care Coordination   Initial Visit Note   07/04/2022 Name: Sean [name] MRN: [id number] DOB: 05/11/1971  Sean [name] is a 51 y.o. year old male who sees [place], [place], [place] for primary care. I spoke with  Sean [name] by phone today  What matters to the patients health and wellness today?  Weight loss    Goals Addressed               This Visit's Progress     "I need to lose weight and eat healthier" (pt-stated)        Care Coordination Interventions: Provided education to patient re: Health eating habits and how obese will affect his health Reviewed medications with patient and discussed purpose of all medications Provided patient and/or caregiver with wellness and weigh loss information about healthy weight programs or agencies  Advice worker) Provided patient with healthy food options educational materials related to weight loss Reviewed scheduled/upcoming provider appointments including all pending upcoming appointments Care Guide referral for Weight lost clinics and/or agencies near pt's area Discussed plans with patient for ongoing care management follow up and provided patient with direct contact information for care management team Screening for signs and symptoms of depression related to chronic disease state  Assessed social determinant of health barriers         SDOH assessments and interventions completed:  Yes  SDOH Interventions Today    Flowsheet Row Most Recent Value  SDOH Interventions   Food Insecurity Interventions Intervention Not Indicated  Housing Interventions Intervention Not Indicated        Care Coordination Interventions Activated:  Yes  Care Coordination Interventions:  Yes, provided   Follow up plan: Follow up call scheduled for 08/05/2022 @ 10:00 AM    Encounter Outcome:  Pt. Visit Completed   Raina Mina, RN Care Management Coordinator [place] Office [phone]

## 2022-07-04 NOTE — Telephone Encounter (Signed)
    Telephone encounter was:  Successful.  07/04/2022 Name: Sean [name] MRN: [id number] DOB: 1971-04-07  Sean [name] is a 51 y.o. year old male who is a primary care patient of [name], [place] . The community resource team was consulted for assistance with  Weight Loss  Care guide performed the following interventions: Patient provided with information about care guide support team and interviewed to confirm resource needs.Patient stated he is looking for a weight loss program and Healthy Eating programs I have mailed resources to patient   Follow Up Plan:  No further follow up planned at this time. The patient has been provided with needed resources.    [place], Care Management  [phone] [address], [place], [place] [address] Phone: [phone] Email: [email or web address]

## 2022-07-07 ENCOUNTER — Ambulatory Visit (HOSPITAL_COMMUNITY): Admit: 2022-07-07 | Payer: BC Managed Care – PPO | Admitting: Gastroenterology

## 2022-07-07 ENCOUNTER — Encounter (HOSPITAL_COMMUNITY): Payer: Self-pay

## 2022-07-07 SURGERY — ESOPHAGOGASTRODUODENOSCOPY (EGD) WITH PROPOFOL
Anesthesia: Monitor Anesthesia Care

## 2022-07-21 DIAGNOSIS — G4733 Obstructive sleep apnea (adult) (pediatric): Secondary | ICD-10-CM | POA: Diagnosis not present

## 2022-07-22 ENCOUNTER — Ambulatory Visit (INDEPENDENT_AMBULATORY_CARE_PROVIDER_SITE_OTHER): Payer: BC Managed Care – PPO

## 2022-07-22 DIAGNOSIS — Z23 Encounter for immunization: Secondary | ICD-10-CM

## 2022-07-23 NOTE — Progress Notes (Signed)
[facility]  [address] [place],  [place]  [address] [phone]  Clinic Day:  07/24/2022  Referring physician: [name], PA   HISTORY OF PRESENT ILLNESS:  The patient is a 51 y.o. 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  His platelets have chronically held around 70k. He comes in today for routine follow-up.  Since his last visit, the patient has been doing fairly well.  Despite his low platelets, he denies having any bruising/bleeding issues which concern him for severe thrombocytopenia being present.  Of note, this patient is being followed by GI in [place].  He brings to my attention that an abdominal ultrasound was recently done.  The results of this study did show findings suspicious for fatty liver disease.  PHYSICAL EXAM:  Blood pressure (!) 157/72, pulse (!) 52, temperature 98.1 F (36.7 C), resp. rate 18, height 5' 11"  (1.803 m), weight (!) 386 lb 3.2 oz (175.2 kg), SpO2 97 %. Wt Readings from Last 3 Encounters:  07/24/22 (!) 386 lb 3.2 oz (175.2 kg)  06/25/22 (!) 392 lb 4 oz (177.9 kg)  04/24/22 (!) 386 lb (175.1 kg)   Body mass index is 53.86 kg/m. Performance status (ECOG): 1 - Symptomatic but completely ambulatory Physical Exam Constitutional:      Appearance: Normal appearance. He is obese. He is not ill-appearing.  HENT:     Mouth/Throat:     Mouth: Mucous membranes are moist.     Pharynx: Oropharynx is clear. No oropharyngeal exudate or posterior oropharyngeal erythema.  Cardiovascular:     Rate and Rhythm: Normal rate and regular rhythm.     Heart sounds: No murmur heard.    No friction rub. No gallop.  Pulmonary:     Effort: Pulmonary effort is normal. No respiratory distress.     Breath sounds: Normal breath sounds. No wheezing, rhonchi or rales.  Abdominal:     General: Bowel sounds are normal. There is no distension.     Palpations: Abdomen is soft. There is no mass.      Tenderness: There is no abdominal tenderness.  Musculoskeletal:        General: No swelling.     Right lower leg: No edema.     Left lower leg: No edema.  Lymphadenopathy:     Cervical: No cervical adenopathy.     Upper Body:     Right upper body: No supraclavicular or axillary adenopathy.     Left upper body: No supraclavicular or axillary adenopathy.     Lower Body: No right inguinal adenopathy. No left inguinal adenopathy.  Skin:    General: Skin is warm.     Coloration: Skin is not jaundiced.     Findings: No lesion or rash.  Neurological:     General: No focal deficit present.     Mental Status: He is alert and oriented to person, place, and time. Mental status is at baseline.  Psychiatric:        Mood and Affect: Mood normal.        Behavior: Behavior normal.        Thought Content: Thought content normal.    LABS:         Latest Ref Rng & Units 07/24/2022   12:00 AM 04/24/2022    2:39 PM 03/24/2022   12:00 AM  CBC  WBC  5.1     4.4  4.4      Hemoglobin 13.5 -  17.5 12.4     14.1  12.9      Hematocrit 41 - 53 37     41.6  40      Platelets 150 - 400 K/uL 75     52.0 Repeated and verified X2.  52         This result is from an external source.      Latest Ref Rng & Units 07/24/2022   12:00 AM 04/24/2022    2:39 PM 03/24/2022   12:00 AM  CMP  Glucose 70 - 99 mg/dL  95    BUN 4 - 21 33     29  25      Creatinine 0.6 - 1.3 2.4     1.98  2.1      Sodium 137 - 147 140     143  142      Potassium 3.5 - 5.1 mEq/L 4.1     4.2  3.9      Chloride 99 - 108 110     108  108      CO2 13 - 22 24     28  31       Calcium 8.7 - 10.7 9.1     9.5  8.5      Total Protein 6.0 - 8.3 g/dL  6.5    Total Bilirubin 0.2 - 1.2 mg/dL  1.0    Alkaline Phos 25 - 125 202     180  192      AST 14 - 40 43     36  47      ALT 10 - 40 U/L 53     55  51         This result is from an external source.    Latest Reference Range & Units 07/24/22 13:27  Iron 45 - 182 ug/dL 87  UIBC ug/dL 286   TIBC 250 - 450 ug/dL 373  Saturation Ratios 17.9 - 39.5 % 23  Ferritin 24 - 336 ng/mL 122    Latest Reference Range & Units 07/24/22 15:00  Folate >5.9 ng/mL 5.3 (L)  Vitamin B12 180 - 914 pg/mL 2,988 (H)  (L): Data is abnormally low (H): Data is abnormally high  ASSESSMENT & PLAN:  Assessment/Plan:  A 51 y.o. male with thrombocytopenia.  His platelet count today is essentially the same as it has been over this past few years.  Although I have surmised that his thrombocytopenia is due to ITP, I am becoming increasingly concerned that underlying liver disease may also be factoring into his thrombocytopenia.  As mentioned previously, an abdominal ultrasound done a few months ago did show findings suspicious for steatohepatitis.  With respect to his liver health, the patient knows that it will be important to lose weight, as well as follow-up with his GI doctors to see if there is any type of therapy that could be used to offset the severity of his likely fatty liver disease.  On another note, his folic acid level is low.  Based upon this, he will need to take at least 629 mcg of folic acid daily to replete his folate stores.  Otherwise, as his thrombocytopenia remains stable, I will see him back in 6 months for repeat clinical assessment.  The patient understands all the plans discussed today and is in agreement with them.     Sean Cowie Macarthur Critchley, MD

## 2022-07-24 ENCOUNTER — Inpatient Hospital Stay: Payer: BC Managed Care – PPO

## 2022-07-24 ENCOUNTER — Telehealth: Payer: Self-pay | Admitting: Oncology

## 2022-07-24 ENCOUNTER — Other Ambulatory Visit: Payer: Self-pay | Admitting: Oncology

## 2022-07-24 ENCOUNTER — Inpatient Hospital Stay: Payer: BC Managed Care – PPO | Attending: Oncology | Admitting: Oncology

## 2022-07-24 VITALS — BP 157/72 | HR 52 | Temp 98.1°F | Resp 18 | Ht 71.0 in | Wt 386.2 lb

## 2022-07-24 DIAGNOSIS — D693 Immune thrombocytopenic purpura: Secondary | ICD-10-CM

## 2022-07-24 DIAGNOSIS — D529 Folate deficiency anemia, unspecified: Secondary | ICD-10-CM | POA: Insufficient documentation

## 2022-07-24 LAB — IRON AND TIBC
Iron: 87 ug/dL (ref 45–182)
Saturation Ratios: 23 % (ref 17.9–39.5)
TIBC: 373 ug/dL (ref 250–450)
UIBC: 286 ug/dL

## 2022-07-24 LAB — BASIC METABOLIC PANEL
BUN: 33 — AB (ref 4–21)
CO2: 24 — AB (ref 13–22)
Chloride: 110 — AB (ref 99–108)
Creatinine: 2.4 — AB (ref 0.6–1.3)
Glucose: 153
Potassium: 4.1 mEq/L (ref 3.5–5.1)
Sodium: 140 (ref 137–147)

## 2022-07-24 LAB — CBC AND DIFFERENTIAL
HCT: 37 — AB (ref 41–53)
Hemoglobin: 12.4 — AB (ref 13.5–17.5)
Neutrophils Absolute: 2.91
Platelets: 75 10*3/uL — AB (ref 150–400)
WBC: 5.1

## 2022-07-24 LAB — FERRITIN: Ferritin: 122 ng/mL (ref 24–336)

## 2022-07-24 LAB — HEPATIC FUNCTION PANEL
ALT: 53 U/L — AB (ref 10–40)
AST: 43 — AB (ref 14–40)
Alkaline Phosphatase: 202 — AB (ref 25–125)
Bilirubin, Total: 0.8

## 2022-07-24 LAB — CBC: RBC: 3.68 — AB (ref 3.87–5.11)

## 2022-07-24 LAB — COMPREHENSIVE METABOLIC PANEL
Albumin: 3.5 (ref 3.5–5.0)
Calcium: 9.1 (ref 8.7–10.7)

## 2022-07-24 LAB — VITAMIN B12: Vitamin B-12: 2988 pg/mL — ABNORMAL HIGH (ref 180–914)

## 2022-07-24 LAB — FOLATE: Folate: 5.3 ng/mL — ABNORMAL LOW (ref >5.9)

## 2022-07-24 LAB — LIPASE, BLOOD: Lipase: 60 U/L — ABNORMAL HIGH (ref 11–51)

## 2022-07-24 NOTE — Telephone Encounter (Signed)
07/24/22 Spoke with patient and confirmed next appt

## 2022-08-05 ENCOUNTER — Ambulatory Visit: Payer: Self-pay | Admitting: *Deleted

## 2022-08-05 NOTE — Patient Outreach (Signed)
  Care Coordination   08/05/2022 Name: Sean [name] MRN: [id number] DOB: March 05, 1971   Care Coordination Outreach Attempts:  An unsuccessful telephone outreach was attempted for a scheduled appointment today.  Follow Up Plan:  Additional outreach attempts will be made to offer the patient care coordination information and services.   Encounter Outcome:  No Answer  Care Coordination Interventions Activated:  No   Care Coordination Interventions:  No, not indicated    Raina Mina, RN Care Management Coordinator [place] Office [phone]

## 2022-08-11 ENCOUNTER — Ambulatory Visit: Payer: Self-pay | Admitting: *Deleted

## 2022-08-11 NOTE — Patient Instructions (Signed)
Visit Information  Thank you for taking time to visit with me today. Please don't hesitate to contact me if I can be of assistance to you.   Following are the goals we discussed today:   Goals Addressed               This Visit's Progress     COMPLETED: "I need to lose weight and eat healthier" (pt-stated)        Care Coordination Interventions: Provided education to patient re: Health eating habits and how obese will affect his health Reviewed medications with patient and discussed purpose of all medications Provided patient and/or caregiver with wellness and weigh loss information about healthy weight programs or agencies  ([employer]) Provided patient with healthy food options educational materials related to weight loss Reviewed scheduled/upcoming provider appointments including all pending upcoming appointments Care Guide referral for Weight lost clinics and/or agencies near pt's area Discussed plans with patient for ongoing care management follow up and provided patient with direct contact information for care management team Screening for signs and symptoms of depression related to chronic disease state  Assessed social determinant of health barriers 9/11 Verified pt received all weight loss information however limited weight loss clinics at nearby cities from his residences. RN also provider offered clinic via [facility] and weight loss clinic in [place]. Based upon pt's job (truck driving) and limitations on utilizing gyms for possible time on exercising unable to explore other other options however procedures and surgical options discussed if his weight poses an issues with affecting his health. No other issues to address at this time. Pt declined any other needs.         Please call the care guide team at [phone] if you need to cancel or reschedule your appointment.   If you are experiencing a Mental Health or [place] or need  someone to talk to, please call the Suicide and Crisis Lifeline: 988 call the [place] National Suicide Prevention Lifeline: 7850447419 or TTY: (518)849-5449 TTY 551-575-5247) to talk to a trained counselor call 1-800-273-TALK (toll free, 24 hour hotline)  Patient verbalizes understanding of instructions and care plan provided today and agrees to view in [place]. Active MyChart status and patient understanding of how to access instructions and care plan via MyChart confirmed with patient.     No further follow up required: No needs  Raina Mina, RN Care Management Coordinator [place] Office [phone]

## 2022-08-11 NOTE — Patient Outreach (Signed)
  Care Coordination   Follow Up Visit Note   08/11/2022 Name: Sean [name] MRN: [id number] DOB: [birth year]  Sean [name] is a 51 y.o. year old male who sees [place], [place], [place] for primary care. I spoke with  Sean [name] by phone today.  What matters to the patients health and wellness today?  No needs    Goals Addressed               This Visit's Progress     COMPLETED: "I need to lose weight and eat healthier" (pt-stated)        Care Coordination Interventions: Provided education to patient re: Health eating habits and how obese will affect his health Reviewed medications with patient and discussed purpose of all medications Provided patient and/or caregiver with wellness and weigh loss information about healthy weight programs or agencies  ([employer]) Provided patient with healthy food options educational materials related to weight loss Reviewed scheduled/upcoming provider appointments including all pending upcoming appointments Care Guide referral for Weight lost clinics and/or agencies near pt's area Discussed plans with patient for ongoing care management follow up and provided patient with direct contact information for care management team Screening for signs and symptoms of depression related to chronic disease state  Assessed social determinant of health barriers 9/11 Verified pt received all weight loss information however limited weight loss clinics at nearby cities from his residences. RN also provided clinic via [facility] and weight loss clinic in [place]. Based upon pt's job (truck driving) and limitations on utilizing gyms for possible time on exercising unable to explore other other options however procedures and surgical options discussed if his weight poses an issues with affecting his health. No other issues to address at this time. Pt declined any other needs.         SDOH assessments and interventions completed:   No     Care Coordination Interventions Activated:  Yes  Care Coordination Interventions:  Yes, provided   Follow up plan: No further intervention required.   Encounter Outcome:  Pt. Visit Completed   Raina Mina, RN Care Management Coordinator [place] Office [phone]

## 2022-08-15 ENCOUNTER — Other Ambulatory Visit: Payer: Self-pay | Admitting: Gastroenterology

## 2022-08-15 DIAGNOSIS — R1013 Epigastric pain: Secondary | ICD-10-CM

## 2022-08-18 ENCOUNTER — Other Ambulatory Visit: Payer: Self-pay

## 2022-08-18 ENCOUNTER — Telehealth: Payer: Self-pay | Admitting: Gastroenterology

## 2022-08-18 DIAGNOSIS — R197 Diarrhea, unspecified: Secondary | ICD-10-CM

## 2022-08-18 DIAGNOSIS — R109 Unspecified abdominal pain: Secondary | ICD-10-CM

## 2022-08-18 DIAGNOSIS — R112 Nausea with vomiting, unspecified: Secondary | ICD-10-CM

## 2022-08-18 NOTE — Telephone Encounter (Signed)
Patient returned your call, please advise. 

## 2022-08-18 NOTE — Telephone Encounter (Signed)
Unable to leave message, vm box has not been set up. Will try again at a later time.

## 2022-08-18 NOTE — Telephone Encounter (Signed)
Patient called in with complaints of nausea, frequent diarrhea, and mid abdominal discomfort/soreness for 1 week. He has been tolerating fluids. He is currently taking carafate, pantoprazole & pepcid as prescribed. In just the last couple of days he did increase carafate to QID, which has seemed to help. He was last seen in [place] with Jaclyn Shaggy, NP on 04/24/22, and was originally scheduled for EGD on 07/07/22 however cancelled d/t a new job and unable to take off. Will route to MD for further recommendations.

## 2022-08-18 NOTE — Telephone Encounter (Signed)
Spoke with patient regarding MD recommendations. Labs & CT orders placed. He has been advised on when/where to go for labs. Schedulers notified & he will call back if he has not heard from anyone within 1 week.

## 2022-08-18 NOTE — Telephone Encounter (Signed)
Inbound call from patient requesting a call back in regards to feeling sick for the last week. Patient states he has been experiencing Diarrhea and vomiting as well. Please advise.  Thank you

## 2022-08-25 ENCOUNTER — Other Ambulatory Visit (INDEPENDENT_AMBULATORY_CARE_PROVIDER_SITE_OTHER): Payer: BC Managed Care – PPO

## 2022-08-25 ENCOUNTER — Telehealth: Payer: Self-pay | Admitting: Gastroenterology

## 2022-08-25 DIAGNOSIS — R109 Unspecified abdominal pain: Secondary | ICD-10-CM

## 2022-08-25 DIAGNOSIS — B0052 Herpesviral keratitis: Secondary | ICD-10-CM | POA: Diagnosis not present

## 2022-08-25 DIAGNOSIS — R112 Nausea with vomiting, unspecified: Secondary | ICD-10-CM | POA: Diagnosis not present

## 2022-08-25 DIAGNOSIS — R197 Diarrhea, unspecified: Secondary | ICD-10-CM | POA: Diagnosis not present

## 2022-08-25 LAB — CBC WITH DIFFERENTIAL/PLATELET
Basophils Absolute: 0.1 10*3/uL (ref 0.0–0.1)
Basophils Relative: 0.8 % (ref 0.0–3.0)
Eosinophils Absolute: 0.1 10*3/uL (ref 0.0–0.7)
Eosinophils Relative: 2 % (ref 0.0–5.0)
HCT: 33.3 % — ABNORMAL LOW (ref 39.0–52.0)
Hemoglobin: 11.5 g/dL — ABNORMAL LOW (ref 13.0–17.0)
Lymphocytes Relative: 18.9 % (ref 12.0–46.0)
Lymphs Abs: 1.3 10*3/uL (ref 0.7–4.0)
MCHC: 34.6 g/dL (ref 30.0–36.0)
MCV: 98.1 fl (ref 78.0–100.0)
Monocytes Absolute: 0.8 10*3/uL (ref 0.1–1.0)
Monocytes Relative: 11.3 % (ref 3.0–12.0)
Neutro Abs: 4.6 10*3/uL (ref 1.4–7.7)
Neutrophils Relative %: 67 % (ref 43.0–77.0)
Platelets: 94 10*3/uL — ABNORMAL LOW (ref 150.0–400.0)
RBC: 3.39 Mil/uL — ABNORMAL LOW (ref 4.22–5.81)
RDW: 15.6 % — ABNORMAL HIGH (ref 11.5–15.5)
WBC: 6.8 10*3/uL (ref 4.0–10.5)

## 2022-08-25 LAB — COMPREHENSIVE METABOLIC PANEL
ALT: 98 U/L — ABNORMAL HIGH (ref 0–53)
AST: 81 U/L — ABNORMAL HIGH (ref 0–37)
Albumin: 3 g/dL — ABNORMAL LOW (ref 3.5–5.2)
Alkaline Phosphatase: 429 U/L — ABNORMAL HIGH (ref 39–117)
BUN: 48 mg/dL — ABNORMAL HIGH (ref 6–23)
CO2: 26 mEq/L (ref 19–32)
Calcium: 9.3 mg/dL (ref 8.4–10.5)
Chloride: 103 mEq/L (ref 96–112)
Creatinine, Ser: 3.6 mg/dL — ABNORMAL HIGH (ref 0.40–1.50)
GFR: 18.98 mL/min — ABNORMAL LOW (ref >60.00)
Glucose, Bld: 132 mg/dL — ABNORMAL HIGH (ref 70–99)
Potassium: 3.5 mEq/L (ref 3.5–5.1)
Sodium: 141 mEq/L (ref 135–145)
Total Bilirubin: 9.2 mg/dL — ABNORMAL HIGH (ref 0.2–1.2)
Total Protein: 6.5 g/dL (ref 6.0–8.3)

## 2022-08-25 NOTE — Telephone Encounter (Unsigned)
Hello, this pt's insurance was not able to approve the CT order through [employer] as they appear out of network. [facility] appears to be in network. Could an order be faxed over to them so the pt can be rescheduled?  I have notified pt of this information, thank you!  [facility] [address] [place] , [place] [address] Phone: [phone] Fax: [phone]

## 2022-08-25 NOTE — Telephone Encounter (Signed)
Unable to leave message for patient regarding [facility] scheduling.

## 2022-08-26 NOTE — Telephone Encounter (Signed)
Unable to reach patient, line rings then disconnects. Fax was sent yesterday to [facility] to schedule patient.

## 2022-08-26 NOTE — Telephone Encounter (Signed)
Spoke with patient & he is aware Sean [name] will be reaching out to schedule his CT. He has been advised to call back if he has not heard from them in regards to an appointment by the end of the week.

## 2022-08-27 ENCOUNTER — Ambulatory Visit (HOSPITAL_COMMUNITY): Payer: BC Managed Care – PPO

## 2022-08-28 ENCOUNTER — Telehealth: Payer: Self-pay | Admitting: Gastroenterology

## 2022-08-28 DIAGNOSIS — K6389 Other specified diseases of intestine: Secondary | ICD-10-CM | POA: Diagnosis not present

## 2022-08-28 DIAGNOSIS — K429 Umbilical hernia without obstruction or gangrene: Secondary | ICD-10-CM | POA: Diagnosis not present

## 2022-08-28 DIAGNOSIS — R161 Splenomegaly, not elsewhere classified: Secondary | ICD-10-CM | POA: Diagnosis not present

## 2022-08-28 DIAGNOSIS — R1900 Intra-abdominal and pelvic swelling, mass and lump, unspecified site: Secondary | ICD-10-CM | POA: Diagnosis not present

## 2022-08-28 NOTE — Telephone Encounter (Signed)
Spoke with Gaspar Bidding from [place] he wanted MD to be aware that the most recent Cr is 3.4, so therefore CT was performed W/O contrast. Fax has been received with the results & listed below. Will route to [name].   IMPRESSION:  Mild wall thickening of the proximal duodenum with periduodenal stranding which may represent a duodenitis.   No bowel obstruction. No free fluid.  Small fat-containing periumbilical hernia.  2.0 x 4.1 x 4.4 cm hypodense lesion in the subcutaneous fat in the periumbilical region possibly a subcutaneous sebaceous cyst.  No renal calculi hydronephrosis.  Splenomegaly.  Electronically Signed by: Rozanna Box, MD on 08/28/22 2:45 pm.

## 2022-08-28 NOTE — Telephone Encounter (Signed)
Aaron Edelman from [place] called regarding patient's CT scan result. Requesting a call back on [phone]. Please call to advise

## 2022-08-29 NOTE — Telephone Encounter (Signed)
Spoke with patient regarding MD recommendations. At this time he would prefer to schedule his EGD when he is ready d/t his work schedule. Advised he call back when ready & if symptoms do not resolve. Pt verbalized all understanding.

## 2022-09-03 ENCOUNTER — Encounter: Payer: Self-pay | Admitting: Family Medicine

## 2022-09-03 ENCOUNTER — Ambulatory Visit (INDEPENDENT_AMBULATORY_CARE_PROVIDER_SITE_OTHER): Payer: BC Managed Care – PPO | Admitting: Family Medicine

## 2022-09-03 VITALS — BP 110/60 | HR 58 | Temp 99.0°F | Ht 71.0 in | Wt 377.2 lb

## 2022-09-03 DIAGNOSIS — R1084 Generalized abdominal pain: Secondary | ICD-10-CM | POA: Diagnosis not present

## 2022-09-03 DIAGNOSIS — N179 Acute kidney failure, unspecified: Secondary | ICD-10-CM | POA: Diagnosis not present

## 2022-09-03 DIAGNOSIS — N189 Chronic kidney disease, unspecified: Secondary | ICD-10-CM | POA: Diagnosis not present

## 2022-09-03 DIAGNOSIS — D649 Anemia, unspecified: Secondary | ICD-10-CM

## 2022-09-03 LAB — POCT URINALYSIS DIPSTICK
Bilirubin, UA: NEGATIVE
Blood, UA: NEGATIVE
Glucose, UA: NEGATIVE
Ketones, UA: NEGATIVE
Leukocytes, UA: NEGATIVE
Nitrite, UA: NEGATIVE
Protein, UA: POSITIVE — AB
Spec Grav, UA: 1.015 (ref 1.010–1.025)
Urobilinogen, UA: 0.2 E.U./dL
pH, UA: 6 (ref 5.0–8.0)

## 2022-09-03 NOTE — Patient Instructions (Signed)
Please return in 1-2 weeks to see [name] for recheck.   I will release your lab results to you on your MyChart account with further instructions. You may see the results before I do, but when I review them I will send you a message with my report or have my assistant call you if things need to be discussed. Please reply to my message with any questions. Thank you!   If you have any questions or concerns, please don't hesitate to send me a message via MyChart or call the office at [phone]. Thank you for visiting with Sean [name] today! It's our pleasure caring for you.

## 2022-09-03 NOTE — Progress Notes (Unsigned)
Subjective  CC:  Chief Complaint  Patient presents with   Fatigue    Pt stated that he has been feeling fatigue for the past 3 weeks and SOB along with joint pain and swelling    Same day acute visit; PCP not available. New pt to me. Chart reviewed.   HPI: Sean [name] is a 51 y.o. male who presents to the office today to address the problems listed above in the chief complaint. Patient is a 51 year old male with multiple medical problems including coronary artery disease, hypertension, hypothyroidism, morbid obesity, chronic abdominal pain, elevated liver tests and more who presents due to not feeling well.  He reports that over the last 3 weeks he has had increasing abdominal pain and fatigue.  He has been working with his GI doctors to evaluate the cause for his pain.  I reviewed multiple notes.  He does have chronic elevation of liver test, likely worsening liver disease, history of peptic ulcer disease and erosions not improved with PPI and was scheduled for an EGD.  Recent lab work per their office showed a worsening creatinine and very elevated bilirubin.  Patient does admit that he had yellow tone of his skin that has improved over the last week.  He has malaise.  Intermittent upper and lower abdominal pain that he reports is severe.  Feels short of breath.  Denies lower extremity edema or chest pain.  He denies fevers, chills, cough or sore throat.  No urinary symptoms. See labs below, acute on chronic kidney disease, worsening anemia macrocytic, very elevated bilirubin, proteinuria.  Assessment  1. Abdominal pain, generalized   2. Acute kidney injury superimposed on chronic kidney disease ([place])   3. Hyperbilirubinemia   4. Anemia, unspecified type      Plan  Complicated case, clinically nontoxic but needs further evaluation: Abdominal pain likely multifactorial but now with acute on chronic kidney disease and hyperbilirubinemia.  Likely worsening liver function.  Needs  further evaluation for possible cirrhosis or other obstructive causes.  Will contact GI for further assistance.  We will recheck lab work today.  Education given to patient that if he experiences severe abdominal pain, rectal blood loss, shortness of breath or chest pain he should seek help at the emergency room.  He will need follow-up with his primary care provider. I spent a total of 55 minutes for this patient encounter. Time spent included preparation, face-to-face counseling with the patient and coordination of care, review of chart and records, and documentation of the encounter.   Follow up: 1 to 2 weeks with PCP 09/24/2022  Orders Placed This Encounter  Procedures   CBC with Differential/Platelet   Iron, TIBC and Ferritin Panel   Renal function panel   Hepatic function panel   TSH   POCT urinalysis dipstick   No orders of the defined types were placed in this encounter.     I reviewed the patients updated PMH, FH, and SocHx.    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[place]) 05/11/2017   Elevated liver enzymes 01/23/2017   NASH (nonalcoholic steatohepatitis) 01/23/2017   Chronic gout of multiple sites 01/20/2017   Prediabetes 01/20/2017   Elevated TSH 11/27/2016   Elevated serum creatinine 11/27/2016  Abnormal EKG 04/07/2016   Morbid obesity ([place])    Other long term (current) drug therapy 02/19/2016   Obstructive sleep apnea syndrome 02/19/2016   Atrial flutter with rapid ventricular response ([place]) 02/12/2016   CKD (chronic kidney disease) stage 3, GFR 30-59 ml/min ([place]) 01/04/2016   Morbid obesity with BMI of 50.0-59.9, adult ([place]) 01/04/2016   S/P Off-pump CABG x 1  12/26/2015   History of coronary artery bypass surgery 12/26/2015   CAD (coronary artery disease)    Unstable angina ([place])    Essential hypertension 12/23/2015   Renal insufficiency 12/23/2015   Thrombocytopenia, idiopathic ([place]) 12/23/2015   Sinus bradycardia 12/23/2015   Dyspepsia and other specified disorders of function of stomach 06/16/2014   Incisional hernia 07/10/2011   Current Meds  Medication Sig   acetaminophen (TYLENOL) 500 MG tablet Take 500-1,500 mg by mouth as needed for moderate pain, fever, headache or mild pain.   allopurinol (ZYLOPRIM) 100 MG tablet Take 1 tablet (100 mg total) by mouth daily.   apixaban (ELIQUIS) 5 MG TABS tablet Take 1 tablet (5 mg total) by mouth 2 (two) times daily.   azelastine (ASTELIN) 0.1 % nasal spray Place 2 sprays into both nostrils 2 (two) times daily. (Patient taking differently: Place 2 sprays into both nostrils as needed for rhinitis or allergies.)   Cyanocobalamin (VITAMIN B-12) 5000 MCG SUBL Place 5,000 mcg under the tongue daily.   diltiazem (CARDIZEM SR) 120 MG 12 hr capsule Take 1 capsule (120 mg total) by mouth 2 (two) times daily.   docusate sodium (STOOL SOFTENER) 100 MG capsule Take 1 capsule (100 mg total) by mouth daily.   Evolocumab (REPATHA SURECLICK) 211 MG/ML SOAJ INJECT THE CONTENTS OF 1 SYRINGE UNDER THE SKIN EVERY 14 DAYS   famotidine (PEPCID) 20 MG tablet TAKE 1 TABLET(20 MG) BY MOUTH TWICE DAILY   furosemide (LASIX) 40 MG tablet Take 40 mg by mouth daily as needed for fluid.   ipratropium (ATROVENT) 0.06 % nasal spray Place 2 sprays into both nostrils 4 (four) times daily.   levothyroxine (SYNTHROID) 50 MCG tablet Take 1 tablet (50 mcg total) by mouth daily before breakfast.   metoprolol succinate (TOPROL-XL) 25 MG 24 hr tablet Take 1 tablet (25 mg total) by mouth daily.   montelukast (SINGULAIR) 10 MG tablet Take 1 tablet (10 mg total) by mouth daily.   nitroGLYCERIN (NITROSTAT) 0.4 MG SL tablet Place 1 tablet (0.4 mg  total) under the tongue every 5 (five) minutes as needed for chest pain.   pantoprazole (PROTONIX) 40 MG tablet Take 1 tablet (40 mg total) by mouth 2 (two) times daily.   polyethylene glycol powder (GLYCOLAX/MIRALAX) 17 GM/SCOOP powder Take 1 dose daily. May increase to BID if needed   potassium chloride (KLOR-CON M) 10 MEQ tablet Take 1 tablet (10 mEq total) by mouth daily.   sucralfate (CARAFATE) 1 g tablet TAKE 1 TABLET(1 GRAM) BY MOUTH TWICE DAILY. MAY INCREASE TO 4 TIMES DAILY FOR ANY ACUTE SYMPTOMS   valACYclovir (VALTREX) 500 MG tablet Take 500 mg by mouth 2 (two) times daily.    Allergies: Patient is allergic to doxycycline, imdur [isosorbide dinitrate], augmentin [amoxicillin-pot clavulanate], and tape. Family History: Patient family history includes CAD in his father; Diabetes in his father, maternal grandfather, and maternal uncle; Heart attack in an other family member; Hyperlipidemia in an other family member. Social History:  Patient  reports that he has never smoked. He has never used smokeless tobacco. He reports current alcohol use. He  reports that he does not use drugs.  Review of Systems: Constitutional: Negative for fever malaise or anorexia Cardiovascular: negative for chest pain Respiratory: negative for SOB or persistent cough Gastrointestinal: negative for abdominal pain  Objective  Vitals: BP 110/60   Pulse (!) 58   Temp 99 F (37.2 C)   Ht 5' 11"  (1.803 m)   Wt (!) 377 lb 3.2 oz (171.1 kg)   SpO2 96%   BMI 52.61 kg/m  General: no acute distress , A&Ox3, nontoxic-appearing HEENT: [place], conjunctiva mild icterus present bilaterally, neck is supple Cardiovascular:  RRR without murmur or gallop.  No significant edema Respiratory:  Good breath sounds bilaterally, CTAB with normal respiratory effort, no rales Abdomen is soft, morbidly obese, nontender, limited exam Skin:  Warm, no rashes  Office Visit on 09/03/2022  Component Date Value Ref Range Status    WBC 09/03/2022 4.7  4.0 - 10.5 K/uL Final   RBC 09/03/2022 3.19 (L)  4.22 - 5.81 Mil/uL Final   Hemoglobin 09/03/2022 10.9 (L)  13.0 - 17.0 g/dL Final   HCT 09/03/2022 32.5 (L)  39.0 - 52.0 % Final   MCV 09/03/2022 101.8 (H)  78.0 - 100.0 fl Final   MCHC 09/03/2022 33.7  30.0 - 36.0 g/dL Final   RDW 09/03/2022 17.7 (H)  11.5 - 15.5 % Final   Platelets 09/03/2022 84.0 (L)  150.0 - 400.0 K/uL Final   Neutrophils Relative % 09/03/2022 52.5  43.0 - 77.0 % Final   Lymphocytes Relative 09/03/2022 27.0  12.0 - 46.0 % Final   Monocytes Relative 09/03/2022 15.0 (H)  3.0 - 12.0 % Final   Eosinophils Relative 09/03/2022 4.6  0.0 - 5.0 % Final   Basophils Relative 09/03/2022 0.9  0.0 - 3.0 % Final   Neutro Abs 09/03/2022 2.5  1.4 - 7.7 K/uL Final   Lymphs Abs 09/03/2022 1.3  0.7 - 4.0 K/uL Final   Monocytes Absolute 09/03/2022 0.7  0.1 - 1.0 K/uL Final   Eosinophils Absolute 09/03/2022 0.2  0.0 - 0.7 K/uL Final   Basophils Absolute 09/03/2022 0.0  0.0 - 0.1 K/uL Final   Iron 09/03/2022 108  50 - 180 mcg/dL Final   TIBC 09/03/2022 368  250 - 425 mcg/dL (calc) Final   %SAT 09/03/2022 29  20 - 48 % (calc) Final   Ferritin 09/03/2022 250  38 - 380 ng/mL Final   Sodium 09/03/2022 141  135 - 145 mEq/L Final   Potassium 09/03/2022 4.2  3.5 - 5.1 mEq/L Final   Chloride 09/03/2022 106  96 - 112 mEq/L Final   CO2 09/03/2022 27  19 - 32 mEq/L Final   Albumin 09/03/2022 3.1 (L)  3.5 - 5.2 g/dL Final   BUN 09/03/2022 34 (H)  6 - 23 mg/dL Final   Creatinine, Ser 09/03/2022 2.95 (H)  0.40 - 1.50 mg/dL Final   Glucose, Bld 09/03/2022 125 (H)  70 - 99 mg/dL Final   Phosphorus 09/03/2022 4.4  2.3 - 4.6 mg/dL Final   GFR 09/03/2022 24.10 (L)  >60.00 mL/min Final   Calcium 09/03/2022 9.5  8.4 - 10.5 mg/dL Final   Total Bilirubin 09/03/2022 3.9 (H)  0.2 - 1.2 mg/dL Final   Bilirubin, Direct 09/03/2022 1.9 (H)  0.0 - 0.3 mg/dL Final   Alkaline Phosphatase 09/03/2022 369 (H)  39 - 117 U/L Final   AST 09/03/2022 66  (H)  0 - 37 U/L Final   ALT 09/03/2022 65 (H)  0 - 53 U/L Final  Total Protein 09/03/2022 6.6  6.0 - 8.3 g/dL Final   Albumin 09/03/2022 3.1 (L)  3.5 - 5.2 g/dL Final   Color, UA 09/03/2022 yellow   Final   Clarity, UA 09/03/2022 clear   Final   Glucose, UA 09/03/2022 Negative  Negative Final   Bilirubin, UA 09/03/2022 negative   Final   Ketones, UA 09/03/2022 negative   Final   Spec Grav, UA 09/03/2022 1.015  1.010 - 1.025 Final   Blood, UA 09/03/2022 negative   Final   pH, UA 09/03/2022 6.0  5.0 - 8.0 Final   Protein, UA 09/03/2022 Positive (A)  Negative Final   Urobilinogen, UA 09/03/2022 0.2  0.2 or 1.0 E.U./dL Final   Nitrite, UA 09/03/2022 negative   Final   Leukocytes, UA 09/03/2022 Negative  Negative Final   TSH 09/03/2022 2.96  0.35 - 5.50 uIU/mL Final     Commons side effects, risks, benefits, and alternatives for medications and treatment plan prescribed today were discussed, and the patient expressed understanding of the given instructions. Patient is instructed to call or message via MyChart if he/she has any questions or concerns regarding our treatment plan. No barriers to understanding were identified. We discussed Red Flag symptoms and signs in detail. Patient expressed understanding regarding what to do in case of urgent or emergency type symptoms.  Medication list was reconciled, printed and provided to the patient in AVS. Patient instructions and summary information was reviewed with the patient as documented in the AVS. This note was prepared with assistance of Dragon voice recognition software. Occasional wrong-word or sound-a-like substitutions may have occurred due to the inherent limitations of voice recognition software  This visit occurred during the SARS-CoV-2 public health emergency.  Safety protocols were in place, including screening questions prior to the visit, additional usage of staff PPE, and extensive cleaning of exam room while observing appropriate  contact time as indicated for disinfecting solutions.

## 2022-09-04 LAB — HEPATIC FUNCTION PANEL
ALT: 65 U/L — ABNORMAL HIGH (ref 0–53)
AST: 66 U/L — ABNORMAL HIGH (ref 0–37)
Albumin: 3.1 g/dL — ABNORMAL LOW (ref 3.5–5.2)
Alkaline Phosphatase: 369 U/L — ABNORMAL HIGH (ref 39–117)
Bilirubin, Direct: 1.9 mg/dL — ABNORMAL HIGH (ref 0.0–0.3)
Total Bilirubin: 3.9 mg/dL — ABNORMAL HIGH (ref 0.2–1.2)
Total Protein: 6.6 g/dL (ref 6.0–8.3)

## 2022-09-04 LAB — RENAL FUNCTION PANEL
Albumin: 3.1 g/dL — ABNORMAL LOW (ref 3.5–5.2)
BUN: 34 mg/dL — ABNORMAL HIGH (ref 6–23)
CO2: 27 mEq/L (ref 19–32)
Calcium: 9.5 mg/dL (ref 8.4–10.5)
Chloride: 106 mEq/L (ref 96–112)
Creatinine, Ser: 2.95 mg/dL — ABNORMAL HIGH (ref 0.40–1.50)
GFR: 24.1 mL/min — ABNORMAL LOW (ref >60.00)
Glucose, Bld: 125 mg/dL — ABNORMAL HIGH (ref 70–99)
Phosphorus: 4.4 mg/dL (ref 2.3–4.6)
Potassium: 4.2 mEq/L (ref 3.5–5.1)
Sodium: 141 mEq/L (ref 135–145)

## 2022-09-04 LAB — CBC WITH DIFFERENTIAL/PLATELET
Basophils Absolute: 0 10*3/uL (ref 0.0–0.1)
Basophils Relative: 0.9 % (ref 0.0–3.0)
Eosinophils Absolute: 0.2 10*3/uL (ref 0.0–0.7)
Eosinophils Relative: 4.6 % (ref 0.0–5.0)
HCT: 32.5 % — ABNORMAL LOW (ref 39.0–52.0)
Hemoglobin: 10.9 g/dL — ABNORMAL LOW (ref 13.0–17.0)
Lymphocytes Relative: 27 % (ref 12.0–46.0)
Lymphs Abs: 1.3 10*3/uL (ref 0.7–4.0)
MCHC: 33.7 g/dL (ref 30.0–36.0)
MCV: 101.8 fl — ABNORMAL HIGH (ref 78.0–100.0)
Monocytes Absolute: 0.7 10*3/uL (ref 0.1–1.0)
Monocytes Relative: 15 % — ABNORMAL HIGH (ref 3.0–12.0)
Neutro Abs: 2.5 10*3/uL (ref 1.4–7.7)
Neutrophils Relative %: 52.5 % (ref 43.0–77.0)
Platelets: 84 10*3/uL — ABNORMAL LOW (ref 150.0–400.0)
RBC: 3.19 Mil/uL — ABNORMAL LOW (ref 4.22–5.81)
RDW: 17.7 % — ABNORMAL HIGH (ref 11.5–15.5)
WBC: 4.7 10*3/uL (ref 4.0–10.5)

## 2022-09-04 LAB — IRON,TIBC AND FERRITIN PANEL
%SAT: 29 % (calc) (ref 20–48)
Ferritin: 250 ng/mL (ref 38–380)
Iron: 108 ug/dL (ref 50–180)
TIBC: 368 mcg/dL (calc) (ref 250–425)

## 2022-09-04 LAB — TSH: TSH: 2.96 u[IU]/mL (ref 0.35–5.50)

## 2022-09-08 ENCOUNTER — Other Ambulatory Visit: Payer: Self-pay

## 2022-09-08 DIAGNOSIS — R748 Abnormal levels of other serum enzymes: Secondary | ICD-10-CM

## 2022-09-08 DIAGNOSIS — R109 Unspecified abdominal pain: Secondary | ICD-10-CM

## 2022-09-23 ENCOUNTER — Ambulatory Visit (HOSPITAL_COMMUNITY)
Admission: RE | Admit: 2022-09-23 | Discharge: 2022-09-23 | Disposition: A | Discharge status: Home / Self Care | Payer: BC Managed Care – PPO | Source: Ambulatory Visit | Attending: Gastroenterology | Admitting: Gastroenterology

## 2022-09-23 DIAGNOSIS — R109 Unspecified abdominal pain: Secondary | ICD-10-CM | POA: Diagnosis not present

## 2022-09-23 DIAGNOSIS — K760 Fatty (change of) liver, not elsewhere classified: Secondary | ICD-10-CM | POA: Diagnosis not present

## 2022-09-23 DIAGNOSIS — R748 Abnormal levels of other serum enzymes: Secondary | ICD-10-CM | POA: Diagnosis not present

## 2022-09-23 DIAGNOSIS — Z9049 Acquired absence of other specified parts of digestive tract: Secondary | ICD-10-CM | POA: Diagnosis not present

## 2022-09-24 ENCOUNTER — Telehealth: Payer: Self-pay | Admitting: Physician Assistant

## 2022-09-24 ENCOUNTER — Ambulatory Visit (INDEPENDENT_AMBULATORY_CARE_PROVIDER_SITE_OTHER): Payer: BC Managed Care – PPO | Admitting: Physician Assistant

## 2022-09-24 ENCOUNTER — Encounter: Payer: Self-pay | Admitting: Physician Assistant

## 2022-09-24 VITALS — BP 126/60 | HR 64 | Temp 98.0°F | Ht 71.0 in | Wt 382.0 lb

## 2022-09-24 DIAGNOSIS — N179 Acute kidney failure, unspecified: Secondary | ICD-10-CM | POA: Diagnosis not present

## 2022-09-24 DIAGNOSIS — R1084 Generalized abdominal pain: Secondary | ICD-10-CM

## 2022-09-24 DIAGNOSIS — I872 Venous insufficiency (chronic) (peripheral): Secondary | ICD-10-CM | POA: Diagnosis not present

## 2022-09-24 DIAGNOSIS — N189 Chronic kidney disease, unspecified: Secondary | ICD-10-CM

## 2022-09-24 DIAGNOSIS — I4892 Unspecified atrial flutter: Secondary | ICD-10-CM

## 2022-09-24 NOTE — Telephone Encounter (Signed)
Pt would like to transfer primary care from Mrs. [name], PA-C at [facility] to Dr. Berniece Pap, MD at [facility].  Reason for request is due to medical complexity, patient is requesting a physician to manage care.   Approve or decline.   Please route to [facility] for scheduling follow up.

## 2022-09-24 NOTE — Progress Notes (Signed)
Sean [name] is a 51 y.o. male here for a follow up of a pre-existing problem.  History of Present Illness:   Chief Complaint  Patient presents with   Abdominal Pain    Pt is still c/o low abdominal pain, U/S done yesterday.    HPI  Abdominal pain His pain was constant and severe with accompanying N/V/D x3 weeks ago when he was seen by [name]. He reports losing ~30lbs within x3 days while he was ill. Abdominal pain still occurring from time to time but improved, occurs after eating in the mornings mostly.  Has follow up scheduled with [name], GI.  US abdomen 09/23/2022: Hepatic steatosis. Please note limited evaluation for focal hepatic masses in a patient with hepatic steatosis due to decreased penetration of the acoustic ultrasound waves.  CT abdomen pelvis 08/28/2022: Mild wall thickening of the proximal duodenum with periduodenal stranding which may represent a duodenitis.  No bowel obstruction. No free fluid. Small fat-containing periumbilical hernia. 2.0 x 4.1 x 4.4 cm hypodense lesion in the subcutaneous fat in the periumbilical region possibly a subcutaneous sebaceous cyst. No renal calculi hydronephrosis. Splenomegaly.    Congestive heart failure Was advised to hold Lasix at last visit due to AKI.  Lab Results  Component Value Date   CREATININE 2.95 (H) 09/03/2022   BUN 34 (H) 09/03/2022   NA 141 09/03/2022   K 4.2 09/03/2022   CL 106 09/03/2022   CO2 27 09/03/2022   He previously was using Lasix 35m prn, typically 2-3 times per week for leg/hand swelling and abdominal distension. He continues to wear TED hose daily until bedtime.   He reports dark discoloration on his lower legs accompanied by swelling. Not weighing daily.  Paroxysmal A-flutter Complains of fatigue and shortness of breath with exertion. He feels that this has worsened since he last saw his cardiologist Dr. HLivia Snellen Reports compliance with Eliquis BID. Has missed some occasional  doses of Diltiazem.   Past Medical History:  Diagnosis Date   Abnormal liver enzymes    a. [place]   Cardiac cirrhosis    a. possible elevated LFTs/low platelets felt due to cardiac cirrhosis per 2017 admission.   Chronic combined systolic and diastolic CHF (congestive heart failure) (HCC)    CKD (chronic kidney disease), stage II    Congenital heart defect    a. rightward rotation of heart, almost dextrocardia   Coronary artery disease    a. s/p CABGx1 in 12/2015.   Hematuria    a. Chronic hx of this, no prior etiology determined through workup per patient.   Hypercholesteremia    a. Prev taken off statin due to abnormal liver function.   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   LIMA to LAD   Sinus bradycardia    Thrombocytopenia (HCC)      Social History   Tobacco Use   Smoking status: Never   Smokeless tobacco: Never  Vaping Use   Vaping Use: Never used  Substance Use Topics   Alcohol use: Yes    Comment: 6 beers per month   Drug use: No    Past Surgical History:  Procedure Laterality Date   APPENDECTOMY     BIOPSY  02/19/2021   Procedure: BIOPSY;  Surgeon: BThornton Park MD;  Location: [facility];  Service: Gastroenterology;;   CARDIAC CATHETERIZATION  N/A 12/24/2015   Procedure: Right/Left Heart Cath and Coronary Angiography;  Surgeon: Jolaine Artist, MD;  Location: [place] [facility];  Service: Cardiovascular;  Laterality: N/A;   CARDIOVERSION N/A 02/14/2016   Procedure: CARDIOVERSION;  Surgeon: Pixie Casino, MD;  Location: [facility] END[facility];  Service: Cardiovascular;  Laterality: N/A;   COLONOSCOPY WITH PROPOFOL N/A 02/19/2021   Procedure: COLONOSCOPY WITH PROPOFOL;  Surgeon: Thornton Park, MD;  Location: [facility];  Service: Gastroenterology;  Laterality: N/A;   CORONARY ARTERY BYPASS GRAFT N/A 12/26/2015   Procedure: Off Pump Coronary  Artery Bypass Grafting times one using left internal mammary artery;  Surgeon: Rexene Alberts, MD;  Location: [place];  Service: Open Heart Surgery;  Laterality: N/A;   ESOPHAGOGASTRODUODENOSCOPY (EGD) WITH PROPOFOL N/A 02/19/2021   Procedure: ESOPHAGOGASTRODUODENOSCOPY (EGD) WITH PROPOFOL;  Surgeon: Thornton Park, MD;  Location: [facility];  Service: Gastroenterology;  Laterality: N/A;   GALLBLADDER SURGERY     HERNIA REPAIR     LEFT HEART CATH AND CORS/GRAFTS ANGIOGRAPHY N/A 04/15/2017   Procedure: Left Heart Cath and Cors/Grafts Angiography;  Surgeon: Troy Sine, MD;  Location: [place] [facility];  Service: Cardiovascular;  Laterality: N/A;   LEFT HEART CATH AND CORS/GRAFTS ANGIOGRAPHY N/A 11/02/2020   Procedure: LEFT HEART CATH AND CORS/GRAFTS ANGIOGRAPHY;  Surgeon: Sherren Mocha, MD;  Location: [place] [facility];  Service: Cardiovascular;  Laterality: N/A;   POLYPECTOMY  02/19/2021   Procedure: POLYPECTOMY;  Surgeon: Thornton Park, MD;  Location: [facility];  Service: Gastroenterology;;   TEE WITHOUT CARDIOVERSION N/A 12/26/2015   Procedure: TRANSESOPHAGEAL ECHOCARDIOGRAM (TEE);  Surgeon: Rexene Alberts, MD;  Location: [place];  Service: Open Heart Surgery;  Laterality: N/A;   TEE WITHOUT CARDIOVERSION N/A 02/14/2016   Procedure: TRANSESOPHAGEAL ECHOCARDIOGRAM (TEE);  Surgeon: Pixie Casino, MD;  Location: [facility] ENDOSCOPY;  Service: Cardiovascular;  Laterality: N/A;    Family History  Problem Relation Age of Onset   CAD Father        2 stents ~ 54   Diabetes Father    Heart attack Other    Hyperlipidemia Other    Diabetes Maternal Grandfather    Diabetes Maternal Uncle     Allergies  Allergen Reactions   Doxycycline Shortness Of Breath   Imdur [Isosorbide Dinitrate] Diarrhea    Pt states causes "diarrhea."   Augmentin [Amoxicillin-Pot Clavulanate] Itching   Tape Rash    Current Medications:   Current Outpatient Medications:    acetaminophen (TYLENOL) 500 MG  tablet, Take 500-1,500 mg by mouth as needed for moderate pain, fever, headache or mild pain., Disp: , Rfl:    allopurinol (ZYLOPRIM) 100 MG tablet, Take 1 tablet (100 mg total) by mouth daily., Disp: 90 tablet, Rfl: 1   apixaban (ELIQUIS) 5 MG TABS tablet, Take 1 tablet (5 mg total) by mouth 2 (two) times daily., Disp: 180 tablet, Rfl: 1   azelastine (ASTELIN) 0.1 % nasal spray, Place 2 sprays into both nostrils 2 (two) times daily. (Patient taking differently: Place 2 sprays into both nostrils as needed for rhinitis or allergies.), Disp: 30 mL, Rfl: 12   Cyanocobalamin (VITAMIN B-12) 5000 MCG SUBL, Place 5,000 mcg under the tongue daily., Disp: , Rfl:    diltiazem (CARDIZEM SR) 120 MG 12 hr capsule, Take 1 capsule (120 mg total) by mouth 2 (two) times daily., Disp: 60 capsule, Rfl: 2   docusate sodium (STOOL SOFTENER) 100 MG capsule, Take 1 capsule (100 mg total) by mouth daily., Disp: 30 capsule,  Rfl: 0   Evolocumab (REPATHA SURECLICK) 390 MG/ML SOAJ, INJECT THE CONTENTS OF 1 SYRINGE UNDER THE SKIN EVERY 14 DAYS, Disp: 6 mL, Rfl: 3   famotidine (PEPCID) 20 MG tablet, TAKE 1 TABLET(20 MG) BY MOUTH TWICE DAILY, Disp: 60 tablet, Rfl: 3   furosemide (LASIX) 40 MG tablet, Take 40 mg by mouth daily as needed for fluid., Disp: , Rfl:    ipratropium (ATROVENT) 0.06 % nasal spray, Place 2 sprays into both nostrils 4 (four) times daily., Disp: 15 mL, Rfl: 12   levothyroxine (SYNTHROID) 50 MCG tablet, Take 1 tablet (50 mcg total) by mouth daily before breakfast., Disp: 90 tablet, Rfl: 1   metoprolol succinate (TOPROL-XL) 25 MG 24 hr tablet, Take 1 tablet (25 mg total) by mouth daily., Disp: 90 tablet, Rfl: 1   montelukast (SINGULAIR) 10 MG tablet, Take 1 tablet (10 mg total) by mouth daily., Disp: 90 tablet, Rfl: 1   nitroGLYCERIN (NITROSTAT) 0.4 MG SL tablet, Place 1 tablet (0.4 mg total) under the tongue every 5 (five) minutes as needed for chest pain., Disp: 25 tablet, Rfl: 6   pantoprazole (PROTONIX) 40  MG tablet, Take 1 tablet (40 mg total) by mouth 2 (two) times daily., Disp: 180 tablet, Rfl: 3   polyethylene glycol powder (GLYCOLAX/MIRALAX) 17 GM/SCOOP powder, Take 1 dose daily. May increase to BID if needed, Disp: 255 g, Rfl: 3   potassium chloride (KLOR-CON M) 10 MEQ tablet, Take 1 tablet (10 mEq total) by mouth daily., Disp: 90 tablet, Rfl: 1   sucralfate (CARAFATE) 1 g tablet, TAKE 1 TABLET(1 GRAM) BY MOUTH TWICE DAILY. MAY INCREASE TO 4 TIMES DAILY FOR ANY ACUTE SYMPTOMS, Disp: 60 tablet, Rfl: 2   valACYclovir (VALTREX) 500 MG tablet, Take 500 mg by mouth 2 (two) times daily., Disp: , Rfl:    Review of Systems:   Review of Systems  Constitutional:  Positive for malaise/fatigue. Negative for chills, fever and weight loss.  HENT:  Negative for hearing loss, sinus pain and sore throat.   Respiratory:  Positive for shortness of breath. Negative for cough and hemoptysis.   Cardiovascular:  Positive for leg swelling. Negative for chest pain, palpitations and PND.  Gastrointestinal:  Positive for abdominal pain. Negative for constipation, diarrhea, heartburn, nausea and vomiting.  Genitourinary:  Negative for dysuria, frequency and urgency.  Musculoskeletal:  Negative for back pain, myalgias and neck pain.  Skin:  Negative for itching and rash.  Neurological:  Negative for dizziness, tingling, seizures and headaches.  Endo/Heme/Allergies:  Negative for polydipsia.  Psychiatric/Behavioral:  Negative for depression. The patient is not nervous/anxious.     Vitals:   Vitals:   09/24/22 1507  BP: 126/60  Pulse: 64  Temp: 98 F (36.7 C)  TempSrc: Temporal  SpO2: 95%  Weight: (!) 382 lb (173.3 kg)  Height: 5' 11"  (1.803 m)     Body mass index is 53.28 kg/m.  Physical Exam:   Physical Exam Vitals and nursing note reviewed.  Constitutional:      General: He is not in acute distress.    Appearance: He is well-developed. He is not ill-appearing or toxic-appearing.  Cardiovascular:      Rate and Rhythm: Normal rate and regular rhythm.     Pulses: Normal pulses.     Heart sounds: Normal heart sounds, S1 normal and S2 normal.  Pulmonary:     Effort: Pulmonary effort is normal.     Breath sounds: Normal breath sounds.  Skin:    General:  Skin is warm and dry.  Neurological:     Mental Status: He is alert.     GCS: GCS eye subscore is 4. GCS verbal subscore is 5. GCS motor subscore is 6.  Psychiatric:        Speech: Speech normal.        Behavior: Behavior normal. Behavior is cooperative.     Assessment and Plan:   ***   I,Alexis Herring,acting as a scribe for [employer], PA.,have documented all relevant documentation on the behalf of [name], PA,as directed by  [name], PA while in the presence of [name], [place].  ***

## 2022-09-24 NOTE — Patient Instructions (Signed)
It was great to see you!  Our staff will call you with a message to schedule follow-up with a physician  Call MD: Anytime you have any of the following symptoms: 1) 3 pound weight gain in 24 hours or 5 pounds in 1 week 2) shortness of breath, with or without a dry hacking cough 3) swelling in the hands, feet or stomach 4) if you have to sleep on extra pillows at night in order to breathe.    We will update blood work today and be in touch with these results.  Take care,  [name] PA-C

## 2022-09-25 ENCOUNTER — Other Ambulatory Visit: Payer: Self-pay | Admitting: Physician Assistant

## 2022-09-25 DIAGNOSIS — R7309 Other abnormal glucose: Secondary | ICD-10-CM

## 2022-09-25 DIAGNOSIS — N179 Acute kidney failure, unspecified: Secondary | ICD-10-CM

## 2022-09-25 LAB — CBC WITH DIFFERENTIAL/PLATELET
Basophils Absolute: 0 10*3/uL (ref 0.0–0.1)
Basophils Relative: 0.3 % (ref 0.0–3.0)
Eosinophils Absolute: 0.3 10*3/uL (ref 0.0–0.7)
Eosinophils Relative: 4.6 % (ref 0.0–5.0)
HCT: 33.3 % — ABNORMAL LOW (ref 39.0–52.0)
Hemoglobin: 11.4 g/dL — ABNORMAL LOW (ref 13.0–17.0)
Lymphocytes Relative: 27.8 % (ref 12.0–46.0)
Lymphs Abs: 1.6 10*3/uL (ref 0.7–4.0)
MCHC: 34.1 g/dL (ref 30.0–36.0)
MCV: 100.1 fl — ABNORMAL HIGH (ref 78.0–100.0)
Monocytes Absolute: 0.6 10*3/uL (ref 0.1–1.0)
Monocytes Relative: 10.8 % (ref 3.0–12.0)
Neutro Abs: 3.3 10*3/uL (ref 1.4–7.7)
Neutrophils Relative %: 56.5 % (ref 43.0–77.0)
Platelets: 62 10*3/uL — ABNORMAL LOW (ref 150.0–400.0)
RBC: 3.33 Mil/uL — ABNORMAL LOW (ref 4.22–5.81)
RDW: 16.8 % — ABNORMAL HIGH (ref 11.5–15.5)
WBC: 5.9 10*3/uL (ref 4.0–10.5)

## 2022-09-25 LAB — COMPREHENSIVE METABOLIC PANEL
ALT: 30 U/L (ref 0–53)
AST: 33 U/L (ref 0–37)
Albumin: 3.2 g/dL — ABNORMAL LOW (ref 3.5–5.2)
Alkaline Phosphatase: 156 U/L — ABNORMAL HIGH (ref 39–117)
BUN: 24 mg/dL — ABNORMAL HIGH (ref 6–23)
CO2: 26 mEq/L (ref 19–32)
Calcium: 8.7 mg/dL (ref 8.4–10.5)
Chloride: 109 mEq/L (ref 96–112)
Creatinine, Ser: 2.66 mg/dL — ABNORMAL HIGH (ref 0.40–1.50)
GFR: 27.27 mL/min — ABNORMAL LOW (ref >60.00)
Glucose, Bld: 146 mg/dL — ABNORMAL HIGH (ref 70–99)
Potassium: 4.3 mEq/L (ref 3.5–5.1)
Sodium: 142 mEq/L (ref 135–145)
Total Bilirubin: 1.3 mg/dL — ABNORMAL HIGH (ref 0.2–1.2)
Total Protein: 6.2 g/dL (ref 6.0–8.3)

## 2022-09-27 ENCOUNTER — Other Ambulatory Visit: Payer: Self-pay | Admitting: Physician Assistant

## 2022-09-30 ENCOUNTER — Other Ambulatory Visit (INDEPENDENT_AMBULATORY_CARE_PROVIDER_SITE_OTHER): Payer: BC Managed Care – PPO

## 2022-09-30 ENCOUNTER — Encounter: Payer: Self-pay | Admitting: Gastroenterology

## 2022-09-30 ENCOUNTER — Ambulatory Visit: Payer: BC Managed Care – PPO | Admitting: Gastroenterology

## 2022-09-30 VITALS — BP 132/82 | HR 57 | Ht 71.0 in | Wt 384.0 lb

## 2022-09-30 DIAGNOSIS — K7460 Unspecified cirrhosis of liver: Secondary | ICD-10-CM | POA: Diagnosis not present

## 2022-09-30 DIAGNOSIS — R7309 Other abnormal glucose: Secondary | ICD-10-CM

## 2022-09-30 DIAGNOSIS — N189 Chronic kidney disease, unspecified: Secondary | ICD-10-CM

## 2022-09-30 DIAGNOSIS — R933 Abnormal findings on diagnostic imaging of other parts of digestive tract: Secondary | ICD-10-CM | POA: Diagnosis not present

## 2022-09-30 DIAGNOSIS — R112 Nausea with vomiting, unspecified: Secondary | ICD-10-CM

## 2022-09-30 DIAGNOSIS — N179 Acute kidney failure, unspecified: Secondary | ICD-10-CM

## 2022-09-30 DIAGNOSIS — R109 Unspecified abdominal pain: Secondary | ICD-10-CM

## 2022-09-30 LAB — COMPREHENSIVE METABOLIC PANEL
ALT: 29 U/L (ref 0–53)
AST: 32 U/L (ref 0–37)
Albumin: 3.5 g/dL (ref 3.5–5.2)
Alkaline Phosphatase: 161 U/L — ABNORMAL HIGH (ref 39–117)
BUN: 27 mg/dL — ABNORMAL HIGH (ref 6–23)
CO2: 27 mEq/L (ref 19–32)
Calcium: 9.6 mg/dL (ref 8.4–10.5)
Chloride: 109 mEq/L (ref 96–112)
Creatinine, Ser: 2.51 mg/dL — ABNORMAL HIGH (ref 0.40–1.50)
GFR: 29.24 mL/min — ABNORMAL LOW (ref >60.00)
Glucose, Bld: 92 mg/dL (ref 70–99)
Potassium: 4.1 mEq/L (ref 3.5–5.1)
Sodium: 142 mEq/L (ref 135–145)
Total Bilirubin: 1.2 mg/dL (ref 0.2–1.2)
Total Protein: 6.8 g/dL (ref 6.0–8.3)

## 2022-09-30 LAB — HEMOGLOBIN A1C: Hgb A1c MFr Bld: 5.2 % (ref 4.6–6.5)

## 2022-09-30 MED ORDER — AMITRIPTYLINE HCL 25 MG PO TABS: 25.0000 mg | ORAL_TABLET | Freq: Every day | ORAL | 5 refills | Status: DC

## 2022-09-30 NOTE — Patient Instructions (Addendum)
It was a pleasure to see you today and meet your sister.  We discussed started amitriptyline 25 mg every night to try to help prevent the abdominal pain. I would like for you to take this daily for at least 4-6 weeks. If you aren't noticing a change we can increase your dose at that time.   We discussed an upper endoscopy to evaluate the abnormal small bowel seen on CT scan. We will schedule this at this hospital.   You should have an imaging study every 6 months to monitor for the development of hepatocellular carcinoma (liver cancer). The risk is low, but, if liver cancer is diagnosed early, there are better treatment options.  The next test should be in April 2024.  I recommend a high-protein, primarily plant-based diet.  Avoid red meat.  No raw or undercooked meat, seafood, or shellfish.  Work to maintain a health weight. Minimize salt intake.  Please do not consume more than 2000 mg of sodium every day. We discussed going to [facility].   I recommend that you take a multivitamin.  Stay active. Weight-based exercise for 30 minutes at least 3 days a week is recommended.  I recommend that you not drink any alcohol including beer, wine, liquor, and non-alcoholic beer.  I have recommend that you be vaccinated for hepatitis A, hepatitis B, the seasonal flu vaccine and the Pneumovax, if you have not already had them.   You are at increased risk of osteopenia and osteoporosis. You should be screened for these metabolic bone diseases if you have not already had the testing performed.   The [employer] and UpToDate are good websites for information about your liver.    We need to take the best care of you  and your liver that we can. I am afraid that if your liver disease progresses, you would not be a candidate for a liver transplant due to your heart.  I'd like to see you back in the office in 3-6 months.  I recommend that you go to [facility].

## 2022-09-30 NOTE — Progress Notes (Signed)
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 Reviewed diagnosis and management. Discussed referral to health weight and wellness.   Abnormal duodenal on recent CT scan at [facility]: EGD recommended.   Abdominal pain, nausea, and vomiting.  Previously attributed to gastric erosions/gastritis.  Persistent symptoms despite PPI to BID and Carafate twice daily.  Now with recent progression in symptoms. Labs show elevated lipase. But pancreas appears normal on imaging. Consider adding famotidine PRN.  Work to maintain a healthy weight. Consider trial of tricycylic antidepressant for long-term management of chronic pain. Will start low dose amitriptyline.   Gastric intestinal metaplasia:  Repeat EGD recommended for gastric mapping in 2025.  History of tubular adenoma with incomplete colonoscopy 2022: Consider repeat trial at colonoscopy +/- CT colonography 2027.   PLAN: - Continue pantoprazole 40 mg BID, famotidine 20 mg QHS - Continue Carafate 1g BID-QID - Work to maintain a health weight, regular exercise, healthy diet - Abdominal ultrasound with special attention  to the pancreas March 2024 - Discussed diaphragmatic breathing to manage chronic abdominal pain - Start amitriptyline 25 mg QHS - EGD to evaluate abnormal CT scan - Procedure to be scheduled at the hospital - Needs Eliquis washout for the endoscopy - CT colonography 2027 for colon cancer screening - Referral to healthy weight and wellness to discuss weight loss strategies - Lifestyle recommendations outlined in patient instructions - Follow-up in 3-6 months, earlier as needed   Please see the "Patient Instructions" section for addition details about the plan.  HPI: Sean [name] is a 51 y.o. male last seen 04/24/22.  He returns today in scheduled follow-up.  He has obesity, hyperlipidemia, hypertension, chronic kidney disease, obstructive sleep apnea, coronary artery disease and coronary artery bypass in 2017, atrial fibrillation on Eliquis, NASH, reflux, and thrombocytopenia. His sister accompanies him to this appointment.   He has been under evaluation for nausea, early morning vomiting, and epigastric pain.  He will occasionally have central, lower abdominal pain. Symptoms have been present for over 30 years. He stated waking up sick every morning for as long as he can remember, since childhood.  He awakens in the morning with nausea and vomits.  However, for the past 4 to 5 years he vomits up a small amount bright red blood with clear phlegm most mornings. He showed me a photo from his phone of blood and clear phlegm he recently vomited into the bathtub. Evaluation has included EGD and abdominal ultrasound.  Longstanding history of GERD treated the best with esomeprazole.  Did not find pantoprazole as effective. Frequently sleeps elevated in a recliner. He is taking Famotidine 29m QD. He is not taking Pantoprazole 41mbid as prescribed as it was not covered by his insurance, instead he is taking another PPI  otc once or twice daly (he cannot recall the name of which PPI he is taking).    He has a history of elevated LFTs since at least 2017, presumed to have NASH. He denies ever having a liver biopsy or seeing a hepatologist.  He underwent an abdominal elastography 01/15/2021 which showed a median kPA level of 3.7 which indicated low probability for significant liver disease but these results are questionable in a morbidly obese patient.  An abdominal MRI was scheduled to further evaluate his liver but the patient was claustrophobic and could not complete the study. A Fibrosure test was considered  Since I last saw [name] in the office he is also seen hematologist Dr. Lavera Guise 03/24/2022 who thought that his thrombocytopenia was likely due to underlying liver disease. Labs done during this visit showed iron saturation 22 and ferritin 66.   Endoscopic history: - EGD 01/2021 with [name] which showed gastric intestinal metaplasia with a normal esophagus.  No evidence of esophageal or gastric varices.   - Colonoscopy 01/3021 which was incomplete, one tubular adenomatous polyp was removed from the colon - CT colonography 04/22/21 showed no polyp or mass. He did not tolerate the imaging study well and vowed to never do it again.   Elastography 01/15/21 showed an echogenic liver, Median kPa: 3.7; however, the study was limited in accuracy due to body habitus.   CT scan at [facility] 08/28/2022 to evaluate the abdominal pain: Mild wall thickening of the proximal duodenum with periduodenal stranding which may represent duodenitis  Abdominal ultrasound 09/23/22 showed fatty liver  Labs 09/24/2022: Creatinine 2.66, glucose 146, sodium 142, albumin 3.2, total bilirubin 1.3, AST 33, ALT 30, alk phos 156, hemoglobin 11.4, MCV 100.1, RDW 16.8, platelets 62  Last INR 04/24/2022 was 1.2  Returns in follow-up. Continues to have some abdominal pain.  He is living with his mother. Admits to eating out a lot due to progressive memory problems and she can't remember the recipes. 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   Cirrhosis Maintenance Jaundice: none Ascites: none Bleeding: none Confusion: none HPS: none HRS: none Meds: none Alcohol Use: none 2 gm Na Diet: yes Last [place] Screen: 09/23/22 Last EGD for Varices Surveillance: 02/19/21 Last Colonoscopy: 02/19/21 Recent Meld-Na Score: 15      Past Medical History:  Diagnosis Date   Abnormal liver enzymes    a. Sees a doctor in [place].   Cardiac cirrhosis    a. possible elevated LFTs/low platelets felt due to cardiac cirrhosis per 2017 admission.   Chronic combined systolic and diastolic CHF (congestive heart failure) (HCC)    CKD (chronic kidney disease), stage II    Congenital heart defect    a. rightward rotation of heart, almost dextrocardia   Coronary artery disease    a. s/p CABGx1 in 12/2015.   Hematuria    a. Chronic hx of this, no prior etiology determined through workup per patient.   History of umbilical hernia repair 74/07/1447   History of hernia surgery twice prior   Hypercholesteremia    a. Prev taken off statin due to abnormal liver function.   Hypertension    Incisional hernia 07/10/2011   History of hernia surgery twice prior   Morbid obesity (HCC)    OSA on CPAP    Paroxysmal atrial flutter ([place]) 02/12/2016   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   LIMA to LAD   Sinus bradycardia    Thrombocytopenia ([place])     Past Surgical History:  Procedure Laterality Date   APPENDECTOMY     BIOPSY  02/19/2021   Procedure: BIOPSY;  Surgeon: Thornton Park, MD;  Location: [facility] ENDOSCOPY;  Service: Gastroenterology;;   CARDIAC CATHETERIZATION N/A 12/24/2015   Procedure: Right/Left Heart Cath and Coronary Angiography;  Surgeon: Jolaine Artist, MD;  Location: [place] CV LAB;  Service: Cardiovascular;  Laterality: N/A;   CARDIOVERSION N/A 02/14/2016   Procedure: CARDIOVERSION;  Surgeon: Pixie Casino, MD;  Location: [facility] ENDOSCOPY;  Service: Cardiovascular;  Laterality: N/A;   COLONOSCOPY WITH PROPOFOL N/A  02/19/2021   Procedure: COLONOSCOPY WITH PROPOFOL;  Surgeon: Thornton Park, MD;  Location: [facility] ENDOSCOPY;  Service: Gastroenterology;  Laterality: N/A;   CORONARY ARTERY BYPASS GRAFT N/A 12/26/2015   Procedure: Off Pump Coronary Artery Bypass Grafting times one using left internal mammary artery;  Surgeon: Rexene Alberts, MD;  Location: [place];  Service: Open Heart Surgery;  Laterality: N/A;   ESOPHAGOGASTRODUODENOSCOPY (EGD) WITH PROPOFOL N/A 02/19/2021   Procedure: ESOPHAGOGASTRODUODENOSCOPY (EGD) WITH PROPOFOL;  Surgeon: Thornton Park, MD;  Location: [facility];  Service: Gastroenterology;  Laterality: N/A;   GALLBLADDER SURGERY     HERNIA REPAIR     LEFT HEART CATH AND CORS/GRAFTS ANGIOGRAPHY N/A 04/15/2017   Procedure: Left Heart Cath and Cors/Grafts Angiography;  Surgeon: Troy Sine, MD;  Location: [place] [facility];  Service: Cardiovascular;  Laterality: N/A;   LEFT HEART CATH AND CORS/GRAFTS ANGIOGRAPHY N/A 11/02/2020   Procedure: LEFT HEART CATH AND CORS/GRAFTS ANGIOGRAPHY;  Surgeon: Sherren Mocha, MD;  Location: [place] [facility] LAB;  Service: Cardiovascular;  Laterality: N/A;   POLYPECTOMY  02/19/2021   Procedure: POLYPECTOMY;  Surgeon: Thornton Park, MD;  Location: [facility];  Service: Gastroenterology;;   TEE WITHOUT CARDIOVERSION N/A 12/26/2015   Procedure: TRANSESOPHAGEAL ECHOCARDIOGRAM (TEE);  Surgeon: Rexene Alberts, MD;  Location: [place];  Service: Open Heart Surgery;  Laterality: N/A;   TEE WITHOUT CARDIOVERSION N/A 02/14/2016   Procedure: TRANSESOPHAGEAL ECHOCARDIOGRAM (TEE);  Surgeon: Pixie Casino, MD;  Location[facility] ENDOSCOPY;  Service: Cardiovascular;  Laterality: N/A;    Current Outpatient Medications  Medication Sig Dispense Refill   acetaminophen (TYLENOL) 500 MG tablet Take 500-1,500 mg by mouth as needed for moderate pain, fever, headache or mild pain.     allopurinol (ZYLOPRIM) 100 MG tablet Take 1 tablet (100 mg total) by mouth daily. 90 tablet 1    amitriptyline (ELAVIL) 25 MG tablet Take 1 tablet (25 mg total) by mouth at bedtime. 30 tablet 5   apixaban (ELIQUIS) 5 MG TABS tablet Take 1 tablet (5 mg total) by mouth 2 (two) times daily. 180 tablet 1   azelastine (ASTELIN) 0.1 % nasal spray Place 2 sprays into both nostrils 2 (two) times daily. (Patient taking differently: Place 2 sprays into both nostrils as needed for rhinitis or allergies.) 30 mL 12   Cyanocobalamin (VITAMIN B-12) 5000 MCG SUBL Place 5,000 mcg under the tongue daily.     diltiazem (CARDIZEM SR) 120 MG 12 hr capsule TAKE 1 CAPSULE(120 MG) BY MOUTH TWICE DAILY 60 capsule 0   docusate sodium (STOOL SOFTENER) 100 MG capsule Take 1 capsule (100 mg total) by mouth daily. (Patient not taking: Reported on 10/13/2022) 30 capsule 0   Evolocumab (REPATHA SURECLICK) 989 MG/ML SOAJ INJECT THE CONTENTS OF 1 SYRINGE UNDER THE SKIN EVERY 14 DAYS 6 mL 3   famotidine (PEPCID) 20 MG tablet TAKE 1 TABLET(20 MG) BY MOUTH TWICE DAILY 60 tablet 3   furosemide (LASIX) 40 MG  tablet Take 40 mg by mouth daily as needed for fluid. As needed.     ipratropium (ATROVENT) 0.06 % nasal spray Place 2 sprays into both nostrils 4 (four) times daily. 15 mL 12   levothyroxine (SYNTHROID) 50 MCG tablet Take 1 tablet (50 mcg total) by mouth daily before breakfast. 90 tablet 1   metoprolol succinate (TOPROL-XL) 25 MG 24 hr tablet Take 1 tablet (25 mg total) by mouth daily. 90 tablet 1   montelukast (SINGULAIR) 10 MG tablet Take 1 tablet (10 mg total) by mouth daily. 90 tablet 1   nitroGLYCERIN (NITROSTAT) 0.4 MG SL tablet Place 1 tablet (0.4 mg total) under the tongue every 5 (five) minutes as needed for chest pain. 25 tablet 6   pantoprazole (PROTONIX) 40 MG tablet Take 1 tablet (40 mg total) by mouth 2 (two) times daily. 180 tablet 3   polyethylene glycol powder (GLYCOLAX/MIRALAX) 17 GM/SCOOP powder Take 1 dose daily. May increase to BID if needed 255 g 3   potassium chloride (KLOR-CON M) 10 MEQ tablet Take 1  tablet (10 mEq total) by mouth daily. 90 tablet 1   empagliflozin (JARDIANCE) 10 MG TABS tablet Take 1 tablet (10 mg total) by mouth daily before breakfast. 90 tablet 3   Semaglutide-Weight Management 0.25 MG/0.5ML SOAJ Inject 0.25 mg into the skin once a week for 28 days. 2 mL 0   [START ON 11/11/2022] Semaglutide-Weight Management 0.5 MG/0.5ML SOAJ Inject 0.5 mg into the skin once a week for 28 days. 2 mL 0   [START ON 12/10/2022] Semaglutide-Weight Management 1 MG/0.5ML SOAJ Inject 1 mg into the skin once a week for 28 days. 2 mL 00   [START ON 01/08/2023] Semaglutide-Weight Management 1.7 MG/0.75ML SOAJ Inject 1.7 mg into the skin once a week for 28 days. 3 mL 0   [START ON 02/06/2023] Semaglutide-Weight Management 2.4 MG/0.75ML SOAJ Inject 2.4 mg into the skin once a week for 28 days. 3 mL 0   sucralfate (CARAFATE) 1 g tablet TAKE 1 TABLET(1 GRAM) BY MOUTH TWICE DAILY. MAY INCREASE TO 4 TIMES DAILY FOR ANY ACUTE SYMPTOMS 60 tablet 2   valACYclovir (VALTREX) 500 MG tablet Take 500 mg by mouth 2 (two) times daily. (Patient not taking: Reported on 09/30/2022)     valACYclovir (VALTREX) 500 MG tablet Take 1 tablet (500 mg total) by mouth daily. 90 tablet 3   No current facility-administered medications for this visit.    Allergies as of 09/30/2022 - Review Complete 09/30/2022  Allergen Reaction Noted   Doxycycline Shortness Of Breath 11/15/2021   Imdur [isosorbide dinitrate] Diarrhea 04/16/2017   Augmentin [amoxicillin-pot clavulanate] Itching 04/07/2016   Tape Rash 04/14/2017    Family History  Problem Relation Age of Onset   CAD Father        2 stents ~ 9   Diabetes Father    Heart attack Other    Hyperlipidemia Other    Diabetes Maternal Grandfather    Diabetes Maternal Uncle       Physical Exam: General:   Alert,  well-nourished, pleasant and cooperative in NAD Head:  Normocephalic and atraumatic. Eyes:  Sclera clear, no icterus.   Conjunctiva pink. Abdomen:  Soft, central  obesity, I am unable to reproduce his pain. nondistended, normal bowel sounds, no rebound or guarding. No hepatosplenomegaly.   Neurologic:  Alert and  oriented x4;  grossly nonfocal Skin:  Intact without significant lesions or rashes. No palmar erythema or spider angioma. Psych:  Alert and cooperative. Normal  mood and affect.   Kalyb Pemble L. Tarri Glenn, MD, MPH 10/14/2022, 4:40 PM

## 2022-10-07 DIAGNOSIS — R0981 Nasal congestion: Secondary | ICD-10-CM | POA: Diagnosis not present

## 2022-10-07 DIAGNOSIS — R051 Acute cough: Secondary | ICD-10-CM | POA: Diagnosis not present

## 2022-10-07 DIAGNOSIS — J0100 Acute maxillary sinusitis, unspecified: Secondary | ICD-10-CM | POA: Diagnosis not present

## 2022-10-09 ENCOUNTER — Other Ambulatory Visit: Payer: Self-pay | Admitting: Gastroenterology

## 2022-10-09 DIAGNOSIS — R1013 Epigastric pain: Secondary | ICD-10-CM

## 2022-10-10 ENCOUNTER — Telehealth: Payer: Self-pay | Admitting: *Deleted

## 2022-10-10 ENCOUNTER — Other Ambulatory Visit: Payer: Self-pay | Admitting: Physician Assistant

## 2022-10-13 ENCOUNTER — Ambulatory Visit (INDEPENDENT_AMBULATORY_CARE_PROVIDER_SITE_OTHER): Payer: BC Managed Care – PPO | Admitting: Internal Medicine

## 2022-10-13 ENCOUNTER — Encounter: Payer: Self-pay | Admitting: Internal Medicine

## 2022-10-13 VITALS — BP 127/68 | HR 51 | Temp 98.7°F | Resp 14 | Ht 71.0 in | Wt 385.0 lb

## 2022-10-13 DIAGNOSIS — G4733 Obstructive sleep apnea (adult) (pediatric): Secondary | ICD-10-CM

## 2022-10-13 DIAGNOSIS — I10 Essential (primary) hypertension: Secondary | ICD-10-CM

## 2022-10-13 DIAGNOSIS — N289 Disorder of kidney and ureter, unspecified: Secondary | ICD-10-CM | POA: Diagnosis not present

## 2022-10-13 DIAGNOSIS — Z8719 Personal history of other diseases of the digestive system: Secondary | ICD-10-CM

## 2022-10-13 DIAGNOSIS — D693 Immune thrombocytopenic purpura: Secondary | ICD-10-CM

## 2022-10-13 DIAGNOSIS — Z79899 Other long term (current) drug therapy: Secondary | ICD-10-CM

## 2022-10-13 DIAGNOSIS — Z6841 Body Mass Index (BMI) 40.0 and over, adult: Secondary | ICD-10-CM

## 2022-10-13 DIAGNOSIS — Z5309 Procedure and treatment not carried out because of other contraindication: Secondary | ICD-10-CM | POA: Diagnosis not present

## 2022-10-13 DIAGNOSIS — R748 Abnormal levels of other serum enzymes: Secondary | ICD-10-CM

## 2022-10-13 DIAGNOSIS — I729 Aneurysm of unspecified site: Secondary | ICD-10-CM

## 2022-10-13 DIAGNOSIS — R7303 Prediabetes: Secondary | ICD-10-CM

## 2022-10-13 DIAGNOSIS — R001 Bradycardia, unspecified: Secondary | ICD-10-CM

## 2022-10-13 DIAGNOSIS — N184 Chronic kidney disease, stage 4 (severe): Secondary | ICD-10-CM

## 2022-10-13 DIAGNOSIS — B009 Herpesviral infection, unspecified: Secondary | ICD-10-CM

## 2022-10-13 DIAGNOSIS — T81718A Complication of other artery following a procedure, not elsewhere classified, initial encounter: Secondary | ICD-10-CM

## 2022-10-13 DIAGNOSIS — K7581 Nonalcoholic steatohepatitis (NASH): Secondary | ICD-10-CM

## 2022-10-13 DIAGNOSIS — I872 Venous insufficiency (chronic) (peripheral): Secondary | ICD-10-CM

## 2022-10-13 DIAGNOSIS — I878 Other specified disorders of veins: Secondary | ICD-10-CM

## 2022-10-13 DIAGNOSIS — E039 Hypothyroidism, unspecified: Secondary | ICD-10-CM

## 2022-10-13 DIAGNOSIS — Z9889 Other specified postprocedural states: Secondary | ICD-10-CM

## 2022-10-13 MED ORDER — SEMAGLUTIDE-WEIGHT MANAGEMENT 2.4 MG/0.75ML ~~LOC~~ SOAJ: 2.4000 mg | SUBCUTANEOUS | 0 refills | Status: DC

## 2022-10-13 MED ORDER — EMPAGLIFLOZIN 10 MG PO TABS: 10.0000 mg | ORAL_TABLET | Freq: Every day | ORAL | 3 refills | Status: DC

## 2022-10-13 MED ORDER — SEMAGLUTIDE-WEIGHT MANAGEMENT 1.7 MG/0.75ML ~~LOC~~ SOAJ: 1.7000 mg | SUBCUTANEOUS | 0 refills | Status: DC

## 2022-10-13 MED ORDER — VALACYCLOVIR HCL 500 MG PO TABS: 500.0000 mg | ORAL_TABLET | Freq: Every day | ORAL | 3 refills | Status: DC

## 2022-10-13 MED ORDER — SEMAGLUTIDE-WEIGHT MANAGEMENT 0.5 MG/0.5ML ~~LOC~~ SOAJ: 0.5000 mg | SUBCUTANEOUS | 0 refills | Status: AC

## 2022-10-13 MED ORDER — SEMAGLUTIDE-WEIGHT MANAGEMENT 0.25 MG/0.5ML ~~LOC~~ SOAJ: 0.2500 mg | SUBCUTANEOUS | 0 refills | Status: AC

## 2022-10-13 MED ORDER — SEMAGLUTIDE-WEIGHT MANAGEMENT 1 MG/0.5ML ~~LOC~~ SOAJ: 1.0000 mg | SUBCUTANEOUS | Status: DC

## 2022-10-13 NOTE — Assessment & Plan Note (Signed)
Stable,  controlled  Counseled him to limit salt, limit alcohol, nsaids, weight gain Goal blood pressure of less than 130/80 explained Repeated encouragement for home blood pressure monitoring Have explained risks of poor control are future stroke and heart attacks Resistant/secondary hypertension workup: not necessary in my opinionContinue:  Current hypertension medications:       Sig   diltiazem (CARDIZEM SR) 120 MG 12 hr capsule (Taking) TAKE 1 CAPSULE(120 MG) BY MOUTH TWICE DAILY   furosemide (LASIX) 40 MG tablet (Taking) Take 40 mg by mouth daily as needed for fluid. As needed.   metoprolol succinate (TOPROL-XL) 25 MG 24 hr tablet (Taking) Take 1 tablet (25 mg total) by mouth daily.

## 2022-10-13 NOTE — Assessment & Plan Note (Addendum)
Encouraged weight loss 

## 2022-10-13 NOTE — Assessment & Plan Note (Signed)
He takes 50 mcg of levothyroxine every morning before breakfast and I encouraged him to give at least an hour every time afterwards and before without taking anything else but water Lab Results  Component Value Date   TSH 2.96 09/03/2022

## 2022-10-13 NOTE — Assessment & Plan Note (Signed)
Chronic kidney disease management plan:  Preventing Progression Add ACEi/ARB with the goal to reduce UACR to < 300 and BP 130/80 SGLT2-inhibitor if GFR >20 Avoid NSAIDs, PPI, contrasted studies, aminoglycosides, acyclovir, phosphate-based bowel prep, baclofen Renally dose medications including anti-infectives, atenolol, colchicine, DOACs, diabetes medications, gabapentin, levetiracetam, metoclopramide, opioids Diabetes management: goal A1c <7%; metformin if GFR>45; SGLT2i if GFR >20, GLP-1 receptor agonists if GFR >30; Insulin safe at any GFR Lifestyle and Nutrition (consider consult) If edema, fluid restriction and sodium restriction < 2g/day Exercise Smoking Cessation  Monitoring: Repeat creatinine and send Cystatin C to confirm a consistent decrease in GFR over a 4-monthperiod Send CBC, calcium, phosphorus, A1c, urinalysis, albumin: creatinine ratio, protein: creatinine ratio, PTH, vitamin D, iron studies, and HCV serology Renal ultrasound Consider Spep and upep Consider referral to renal consultant (if 3b or rapid worsening) for assistance with management and to discuss renal replacement therapy and transplant  Management of Complications IV iron if deficient Sodium bicarbonate 650-13079mTID for goal HCO3 >22 Sevelamer 80049mID with meals for goal phosphorus <5.5 Diuretics PRN or standing for edema Management of Comorbidities and Risk Reduction

## 2022-10-13 NOTE — Patient Instructions (Addendum)
It was a pleasure seeing you today! I truly hope you feel like you received 5 star service and please let me know if there is anything I can improve.  Loralee Pacas, MD   Today the plan is... We are going to try to get [facility] injectable weight loss medicine and Jardiance pills which protect your kidneys and a nephrology referral.  Hopefully we can get all of that by the time I see you back in a month or so hopefully you are taking it and it is working well.  Also I encouraged her to keep an eye on your blood pressure and your weight and try on your own to portion control and lose weight regardless of whether you get the weight loss medication.  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   Elevated liver enzymes  Hypothyroidism, unspecified type Assessment & Plan: He takes 50 mcg of levothyroxine every morning before breakfast and I encouraged him to give at least an hour every time afterwards and before without taking anything else but water Lab Results  Component Value Date   TSH 2.96 09/03/2022      NASH (nonalcoholic steatohepatitis)  Morbid obesity with BMI of 50.0-59.9, adult ([place]) -     Semaglutide-Weight Management; Inject 0.25 mg into the skin once a week for 28 days.  Dispense: 2 mL; Refill: 0 -     Semaglutide-Weight Management; Inject 0.5 mg into the skin once a week for 28 days.  Dispense: 2 mL; Refill: 0 -     Semaglutide-Weight Management; Inject 1 mg into the skin once a week for 28 days.  Dispense: 2 mL; Refill: 00 -     Semaglutide-Weight Management; Inject 1.7 mg into the skin once a week for 28 days.  Dispense: 3 mL; Refill: 0 -     Semaglutide-Weight Management; Inject 2.4 mg into the skin once a  week for 28 days.  Dispense: 3 mL; Refill: 0  Chronic venous stasis dermatitis of both lower extremities  Chronic kidney disease, stage 4 (severe) (HCC) Assessment & Plan: Chronic kidney disease management plan:  Preventing Progression Add ACEi/ARB with the goal to reduce UACR to < 300 and BP 130/80 SGLT2-inhibitor if GFR >20 Avoid NSAIDs, PPI, contrasted studies, aminoglycosides, acyclovir, phosphate-based bowel prep, baclofen Renally dose medications including anti-infectives, atenolol, colchicine, DOACs, diabetes medications, gabapentin, levetiracetam, metoclopramide, opioids Diabetes management: goal A1c <7%; metformin if GFR>45; SGLT2i if GFR >20, GLP-1 receptor agonists if GFR >30; Insulin safe at any GFR Lifestyle and Nutrition (consider consult) If edema, fluid restriction and sodium restriction < 2g/day Exercise Smoking Cessation  Monitoring: Repeat creatinine and send Cystatin C to confirm a consistent decrease in GFR over a 87-monthperiod Send CBC, calcium, phosphorus, A1c, urinalysis, albumin: creatinine ratio, protein: creatinine ratio, PTH, vitamin D, iron studies, and HCV serology Renal ultrasound Consider Spep and upep Consider referral to renal consultant (if 3b or rapid worsening) for assistance with management and to discuss renal replacement therapy and transplant  Management of Complications IV iron if deficient Sodium bicarbonate 650-13071mTID for goal HCO3 >22 Sevelamer 80038mID with meals for goal phosphorus <5.5 Diuretics PRN or standing for edema Management of Comorbidities and Risk Reduction   Orders: -     Ambulatory referral to Nephrology -  Empagliflozin; Take 1 tablet (10 mg total) by mouth daily before breakfast.  Dispense: 90 tablet; Refill: 3  Chronic venous stasis  Essential hypertension Assessment & Plan: Stable,  controlled  Counseled him to limit salt, limit alcohol, nsaids, weight gain Goal blood pressure of less than 130/80  explained Repeated encouragement for home blood pressure monitoring Have explained risks of poor control are future stroke and heart attacks Resistant/secondary hypertension workup: not necessary in my opinionContinue:  Current hypertension medications:       Sig   diltiazem (CARDIZEM SR) 120 MG 12 hr capsule (Taking) TAKE 1 CAPSULE(120 MG) BY MOUTH TWICE DAILY   furosemide (LASIX) 40 MG tablet (Taking) Take 40 mg by mouth daily as needed for fluid. As needed.   metoprolol succinate (TOPROL-XL) 25 MG 24 hr tablet (Taking) Take 1 tablet (25 mg total) by mouth daily.          Herpes simplex -     valACYclovir HCl; Take 1 tablet (500 mg total) by mouth daily.  Dispense: 90 tablet; Refill: 3  History of umbilical hernia repair Assessment & Plan: Encouraged weight loss       [x]  RETURN TO CLINIC: No follow-ups on file.   - If you are not doing well: RETURN to the office sooner. - Please bring all your medicines to each appointment.  - If your condition begins to worsen or become severe:  GO to the ER.  [x]  QUESTIONS/CONCERNS:  If you have follow-up questions / concerns:  - CLINICAL: please contact me via phone [phone] OR MyChart messaging  - [place] you will be contacted with the lab results as soon as they are available. For any labs or imaging tests, we will call you if the results are significantly abnormal.  Most normal results will be posted to myChart as soon as they are available and I will comment on them there within 2-3 business days.  The fastest way to get your results is to activate your My Chart account. Instructions are located on the last page of this paperwork. If you have not heard from [place] regarding the results in 2 weeks, please contact this office.  - BILLING: xray and lab orders are billed from separate companies and questions./concerns should be directed to the [place].  For visit charges please discuss with our administrative services

## 2022-10-13 NOTE — Assessment & Plan Note (Signed)
Encouraged weight loss Encouraged CPAP compliance.

## 2022-10-13 NOTE — Progress Notes (Signed)
[place]  Phone: [phone]  New patient visit  Visit Date: 10/13/2022 Patient: Sean [name]   DOB: Jul 24, 1971   51 y.o. Male  MRN: [id number]  Today's healthcare provider: Loralee Pacas, MD  Assessment and Plan:   Sean [name] was seen today for transfer of care and leg swelling.  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 Lab Results  Component Value Date/Time   PLT 62.0 (L) 09/24/2022 03:40 PM   PLT 84.0 (L) 09/03/2022 03:45 PM   PLT 94.0 (L) 08/25/2022 12:21 PM   PLT 75 (A) 07/24/2022 12:00 AM   PLT 52.0 Repeated and verified X2. (L) 04/24/2022 02:39 PM   PLT 58 (LL) 10/31/2020 09:58 AM   PLT 50 (LL) 08/13/2020 09:09 AM   PLT 79 (LL) 09/14/2019 01:50 PM   PLT 89 (LL) 12/08/2018 04:38 PM   PLT 83 (LL) 08/21/2017 12:33 PM   He reports he is not a free bleeder and he takes eliquis for his heart ever since bypass   Statins contraindicated Overview: Takes repatha- due to kidneys/liver concerns.   Sinus bradycardia  Renal insufficiency  Pseudoaneurysm following procedure ([place]) Overview: To rule out   Prediabetes Overview: Lab Results  Component Value Date   HGBA1C 5.2 09/30/2022   HGBA1C 5.6 12/23/2015       Other long term (current) drug therapy  Obstructive sleep apnea syndrome Overview: Has CPAP, settings 13   Assessment & Plan: Encouraged weight loss Encouraged CPAP compliance.   Elevated liver enzymes Overview: Lab Results  Component Value Date   ALKPHOS 161 (H) 09/30/2022    Lab Results  Component Value Date   ALT 29 09/30/2022      Hypothyroidism, unspecified type Assessment & Plan: He takes 50 mcg of levothyroxine every morning before breakfast and I encouraged him to give at least an hour every time afterwards and before without taking anything else but water Lab Results  Component Value Date   TSH 2.96  09/03/2022      NASH (nonalcoholic steatohepatitis) Overview: 01/30/14  OV:  Records from Dr. Melina Copa, and a CT scan which showed no significant abnormalities to his liver, and had multiple laboratory studies that came back non-revealing.  The patient was presumed by GI to be secondary to his cholesterol lowering medication.    2019 [place] Shows Fatty Liver [facility] cardiolgy at [facility].  He does not drink and this has been classified as NASH   Morbid obesity with BMI of 50.0-59.9, adult ([place]) Overview: After [place] GI doctor is setting him up with a wellness clinic to help with weight losS He reports not having tried anything for weight loss either diet or or medicine or surgery in the past  Orders: -     Semaglutide-Weight Management; Inject 0.25 mg into the skin once a week for 28 days.  Dispense: 2 mL; Refill: 0 -     Semaglutide-Weight Management; Inject 0.5 mg into the skin once a week for 28 days.  Dispense: 2 mL; Refill: 0 -     Semaglutide-Weight Management; Inject 1 mg into the skin once a week for 28 days.  Dispense: 2 mL; Refill: 00 -     Semaglutide-Weight Management; Inject 1.7 mg into the skin once a week for 28 days.  Dispense: 3 mL; Refill: 0 -     Semaglutide-Weight Management; Inject 2.4 mg into the skin once a week for 28 days.  Dispense: 3  mL; Refill: 0  Chronic venous stasis dermatitis of both lower extremities Overview: He wears compression stockings and rarely uses Lasix due to he has chronic kidney disease   Chronic kidney disease, stage 4 (severe) (HCC) Overview: Lab Results  Component Value Date/Time   GFR 29.24 (L) 09/30/2022 02:51 PM   GFR 27.27 (L) 09/24/2022 03:40 PM   GFR 24.10 (L) 09/03/2022 03:45 PM   GFR 18.98 (L) 08/25/2022 12:21 PM   GFR 38.98 (L) 04/24/2022 02:39 PM     Assessment & Plan: Chronic kidney disease management plan:  Preventing Progression Add ACEi/ARB with the goal to reduce UACR to < 300 and BP 130/80 SGLT2-inhibitor if GFR >20 Avoid  NSAIDs, PPI, contrasted studies, aminoglycosides, acyclovir, phosphate-based bowel prep, baclofen Renally dose medications including anti-infectives, atenolol, colchicine, DOACs, diabetes medications, gabapentin, levetiracetam, metoclopramide, opioids Diabetes management: goal A1c <7%; metformin if GFR>45; SGLT2i if GFR >20, GLP-1 receptor agonists if GFR >30; Insulin safe at any GFR Lifestyle and Nutrition (consider consult) If edema, fluid restriction and sodium restriction < 2g/day Exercise Smoking Cessation  Monitoring: Repeat creatinine and send Cystatin C to confirm a consistent decrease in GFR over a 9-monthperiod Send CBC, calcium, phosphorus, A1c, urinalysis, albumin: creatinine ratio, protein: creatinine ratio, PTH, vitamin D, iron studies, and HCV serology Renal ultrasound Consider Spep and upep Consider referral to renal consultant (if 3b or rapid worsening) for assistance with management and to discuss renal replacement therapy and transplant  Management of Complications IV iron if deficient Sodium bicarbonate 650-13057mTID for goal HCO3 >22 Sevelamer 80050mID with meals for goal phosphorus <5.5 Diuretics PRN or standing for edema Management of Comorbidities and Risk Reduction   Orders: -     Ambulatory referral to Nephrology -     Empagliflozin; Take 1 tablet (10 mg total) by mouth daily before breakfast.  Dispense: 90 tablet; Refill: 3  Chronic venous stasis Overview: Wears compression stockings Limit lasix due to ckd    Essential hypertension Assessment & Plan: Stable,  controlled  Counseled him to limit salt, limit alcohol, nsaids, weight gain Goal blood pressure of less than 130/80 explained Repeated encouragement for home blood pressure monitoring Have explained risks of poor control are future stroke and heart attacks Resistant/secondary hypertension workup: not necessary in my opinionContinue:  Current hypertension medications:       Sig   diltiazem  (CARDIZEM SR) 120 MG 12 hr capsule (Taking) TAKE 1 CAPSULE(120 MG) BY MOUTH TWICE DAILY   furosemide (LASIX) 40 MG tablet (Taking) Take 40 mg by mouth daily as needed for fluid. As needed.   metoprolol succinate (TOPROL-XL) 25 MG 24 hr tablet (Taking) Take 1 tablet (25 mg total) by mouth daily.          Herpes simplex Overview: Just on the lip and in the eye historically  Orders: -     valACYclovir HCl; Take 1 tablet (500 mg total) by mouth daily.  Dispense: 90 tablet; Refill: 3  History of umbilical hernia repair Overview: History of hernia surgery twice prior After it was repaired it popped back out but [facility] as follows:  2.0 x 4.1 x 4.4 cm hypodense lesion in the subcutaneous fat in the periumbilical region possibly a subcutaneous sebaceous cyst.    Assessment & Plan: Encouraged weight loss      Today's visit is a problem focused visit, but preventive care maintenance concerns were considered by the physician in  deciding follow up : Health Maintenance  Topic Date Due   HIV Screening  02/05/2023 (Originally 12/22/1987)   TETANUS/TDAP  04/25/2029   COLONOSCOPY (Pts 45-27yr Insurance coverage will need to be confirmed)  02/20/2031   Hepatitis C Screening  Completed   HPV VACCINES  Aged Out   INFLUENZA VACCINE  Discontinued   COVID-19 Vaccine  Discontinued    Subjective:  Patient presents today to establish care.  Chief Complaint  Patient presents with   Transfer of care   Leg Swelling   Problem-oriented charting was used to update the medical history: Problem  Chronic Venous Stasis Dermatitis of Both Lower Extremities   He wears compression stockings and rarely uses Lasix due to he has chronic kidney disease   Chronic Venous Stasis   Wears compression stockings Limit lasix due to ckd    Statins Contraindicated   Takes repatha- due to kidneys/liver concerns.   Hypothyroid  Herpes Simplex   Just on  the lip and in the eye historically   Elevated Liver Enzymes   Lab Results  Component Value Date   ALKPHOS 161 (H) 09/30/2022    Lab Results  Component Value Date   ALT 29 09/30/2022      [place] (Nonalcoholic Steatohepatitis)   01/30/14  OV:  Records from Dr. BMelina Copa and a CT scan which showed no significant abnormalities to his liver, and had multiple laboratory studies that came back non-revealing.  The patient was presumed by GI to be secondary to his cholesterol lowering medication.    [facility] [place] [facility]  He does not drink and this has been classified as NASH   Prediabetes   Lab Results  Component Value Date   HGBA1C 5.2 09/30/2022   HGBA1C 5.6 12/23/2015       Obstructive Sleep Apnea Syndrome   Has CPAP, settings 13    Chronic Kidney Disease, Stage 4 (Severe) (Hcc)   Lab Results  Component Value Date/Time   GFR 29.24 (L) 09/30/2022 02:51 PM   GFR 27.27 (L) 09/24/2022 03:40 PM   GFR 24.10 (L) 09/03/2022 03:45 PM   GFR 18.98 (L) 08/25/2022 12:21 PM   GFR 38.98 (L) 04/24/2022 02:39 PM      Morbid Obesity With Bmi of 50.0-59.9, Adult (Hcc)   After [place] GI doctor is setting him up with a wellness clinic to help with weight losS He reports not having tried anything for weight loss either diet or or medicine or surgery in the past   Essential Hypertension  Thrombocytopenia, Idiopathic (Hcc)   11/28/2016:  [facility] most likely due to ITP. Lab Results  Component Value Date/Time   PLT 62.0 (L) 09/24/2022 03:40 PM   PLT 84.0 (L) 09/03/2022 03:45 PM   PLT 94.0 (L) 08/25/2022 12:21 PM   PLT 75 (A) 07/24/2022 12:00 AM   PLT 52.0 Repeated and verified X2. (L) 04/24/2022 02:39 PM   PLT 58 (LL) 10/31/2020 09:58 AM   PLT 50 (LL) 08/13/2020 09:09 AM   PLT 79 (LL) 09/14/2019 01:50 PM   PLT 89 (LL) 12/08/2018 04:38 PM   PLT 83 (LL) 08/21/2017 12:33 PM   He reports he is not a free bleeder and he takes  eliquis for his heart ever since bypass   History of Umbilical Hernia Repair   History of hernia surgery twice prior After it was repaired it popped back out but [facility] just [facility] cyst as follows:  2.0 x 4.1 x 4.4 cm hypodense lesion in the subcutaneous fat in the periumbilical region possibly a subcutaneous sebaceous cyst.     Elevated Tsh (Resolved)  Elevated Serum Creatinine (Resolved)  Morbid Obesity (Hcc) (Resolved)  Unstable Angina (Hcc) (Resolved)  Dyspepsia and Other Specified Disorders of Function of Stomach (Resolved)     Depression Screen    07/04/2022    9:18 AM 11/01/2021    2:32 PM  PHQ 2/9 Scores  PHQ - 2 Score 0 0   No results found for any visits on 10/13/22.   The following were reviewed and entered/updated in epic: Past Medical History:  Diagnosis Date   Abnormal liver enzymes    a. Sees a doctor in [place].   Cardiac cirrhosis    a. possible elevated LFTs/low platelets felt due to cardiac cirrhosis per 2017 admission.   Chronic combined systolic and diastolic CHF (congestive heart failure) (HCC)    CKD (chronic kidney disease), stage II    Congenital heart defect    a. rightward rotation of heart, almost dextrocardia   Coronary artery disease    a. s/p CABGx1 in 12/2015.   Hematuria    a. Chronic hx of this, no prior etiology determined through workup per patient.   History of umbilical hernia repair 19/50/9326   History of hernia surgery twice prior   Hypercholesteremia    a. Prev taken off statin due to abnormal liver function.   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   LIMA to LAD   Sinus bradycardia    Thrombocytopenia ([place])    Past Surgical History:  Procedure Laterality Date   APPENDECTOMY     BIOPSY  02/19/2021   Procedure: BIOPSY;   Surgeon: Thornton Park, MD;  Location: [facility] ENDOSCOPY;  Service: Gastroenterology;;   CARDIAC CATHETERIZATION N/A 12/24/2015   Procedure: Right/Left Heart Cath and Coronary Angiography;  Surgeon: Jolaine Artist, MD;  Location: [place] CV LAB;  Service: Cardiovascular;  Laterality: N/A;   CARDIOVERSION N/A 02/14/2016   Procedure: CARDIOVERSION;  Surgeon: Pixie Casino, MD;  Location: [facility] ENDOSCOPY;  Service: Cardiovascular;  Laterality: N/A;   COLONOSCOPY WITH PROPOFOL N/A 02/19/2021   Procedure: COLONOSCOPY WITH PROPOFOL;  Surgeon: Thornton Park, MD;  Location: [facility];  Service: Gastroenterology;  Laterality: N/A;   CORONARY ARTERY BYPASS GRAFT N/A 12/26/2015   Procedure: Off Pump Coronary Artery Bypass Grafting times one using left internal mammary artery;  Surgeon: Rexene Alberts, MD;  Location: [place];  Service: Open Heart Surgery;  Laterality: N/A;   ESOPHAGOGASTRODUODENOSCOPY (EGD) WITH PROPOFOL N/A 02/19/2021   Procedure: ESOPHAGOGASTRODUODENOSCOPY (EGD) WITH PROPOFOL;  Surgeon: Thornton Park, MD;  Location: [facility];  Service: Gastroenterology;  Laterality: N/A;   GALLBLADDER SURGERY     HERNIA REPAIR     LEFT HEART CATH AND CORS/GRAFTS ANGIOGRAPHY N/A 04/15/2017   Procedure: Left Heart Cath and Cors/Grafts Angiography;  Surgeon: Troy Sine, MD;  Location: [place] CV LAB;  Service: Cardiovascular;  Laterality: N/A;   LEFT HEART CATH AND CORS/GRAFTS ANGIOGRAPHY N/A 11/02/2020   Procedure: LEFT HEART CATH AND CORS/GRAFTS ANGIOGRAPHY;  Surgeon: Sherren Mocha, MD;  Location: [place] CV LAB;  Service: Cardiovascular;  Laterality: N/A;   POLYPECTOMY  02/19/2021   Procedure:  POLYPECTOMY;  Surgeon: Thornton Park, MD;  Location: [facility];  Service: Gastroenterology;;   TEE WITHOUT CARDIOVERSION N/A 12/26/2015   Procedure: TRANSESOPHAGEAL ECHOCARDIOGRAM (TEE);  Surgeon: Rexene Alberts, MD;  Location: [place];  Service: Open Heart Surgery;  Laterality: N/A;    TEE WITHOUT CARDIOVERSION N/A 02/14/2016   Procedure: TRANSESOPHAGEAL ECHOCARDIOGRAM (TEE);  Surgeon: Pixie Casino, MD;  Location[facility] ENDOSCOPY;  Service: Cardiovascular;  Laterality: N/A;   Family History  Problem Relation Age of Onset   CAD Father        2 stents ~ 54   Diabetes Father    Heart attack Other    Hyperlipidemia Other    Diabetes Maternal Grandfather    Diabetes Maternal Uncle    Outpatient Medications Prior to Visit  Medication Sig Dispense Refill   acetaminophen (TYLENOL) 500 MG tablet Take 500-1,500 mg by mouth as needed for moderate pain, fever, headache or mild pain.     allopurinol (ZYLOPRIM) 100 MG tablet Take 1 tablet (100 mg total) by mouth daily. 90 tablet 1   amitriptyline (ELAVIL) 25 MG tablet Take 1 tablet (25 mg total) by mouth at bedtime. 30 tablet 5   apixaban (ELIQUIS) 5 MG TABS tablet Take 1 tablet (5 mg total) by mouth 2 (two) times daily. 180 tablet 1   azelastine (ASTELIN) 0.1 % nasal spray Place 2 sprays into both nostrils 2 (two) times daily. (Patient taking differently: Place 2 sprays into both nostrils as needed for rhinitis or allergies.) 30 mL 12   Cyanocobalamin (VITAMIN B-12) 5000 MCG SUBL Place 5,000 mcg under the tongue daily.     diltiazem (CARDIZEM SR) 120 MG 12 hr capsule TAKE 1 CAPSULE(120 MG) BY MOUTH TWICE DAILY 60 capsule 0   Evolocumab (REPATHA SURECLICK) 725 MG/ML SOAJ INJECT THE CONTENTS OF 1 SYRINGE UNDER THE SKIN EVERY 14 DAYS 6 mL 3   famotidine (PEPCID) 20 MG tablet TAKE 1 TABLET(20 MG) BY MOUTH TWICE DAILY 60 tablet 3   furosemide (LASIX) 40 MG tablet Take 40 mg by mouth daily as needed for fluid. As needed.     ipratropium (ATROVENT) 0.06 % nasal spray Place 2 sprays into both nostrils 4 (four) times daily. 15 mL 12   levothyroxine (SYNTHROID) 50 MCG tablet Take 1 tablet (50 mcg total) by mouth daily before breakfast. 90 tablet 1   metoprolol succinate (TOPROL-XL) 25 MG 24 hr tablet Take 1 tablet (25 mg total) by mouth  daily. 90 tablet 1   montelukast (SINGULAIR) 10 MG tablet Take 1 tablet (10 mg total) by mouth daily. 90 tablet 1   nitroGLYCERIN (NITROSTAT) 0.4 MG SL tablet Place 1 tablet (0.4 mg total) under the tongue every 5 (five) minutes as needed for chest pain. 25 tablet 6   pantoprazole (PROTONIX) 40 MG tablet Take 1 tablet (40 mg total) by mouth 2 (two) times daily. 180 tablet 3   polyethylene glycol powder (GLYCOLAX/MIRALAX) 17 GM/SCOOP powder Take 1 dose daily. May increase to BID if needed 255 g 3   potassium chloride (KLOR-CON M) 10 MEQ tablet Take 1 tablet (10 mEq total) by mouth daily. 90 tablet 1   sucralfate (CARAFATE) 1 g tablet TAKE 1 TABLET(1 GRAM) BY MOUTH TWICE DAILY. MAY INCREASE TO 4 TIMES DAILY FOR ANY ACUTE SYMPTOMS 60 tablet 2   docusate sodium (STOOL SOFTENER) 100 MG capsule Take 1 capsule (100 mg total) by mouth daily. (Patient not taking: Reported on 10/13/2022) 30 capsule 0   valACYclovir (VALTREX) 500 MG  tablet Take 500 mg by mouth 2 (two) times daily. (Patient not taking: Reported on 09/30/2022)     No facility-administered medications prior to visit.    Allergies  Allergen Reactions   Doxycycline Shortness Of Breath   Imdur [Isosorbide Dinitrate] Diarrhea    Pt states causes "diarrhea."   Augmentin [Amoxicillin-Pot Clavulanate] Itching   Tape Rash   Social History   Tobacco Use   Smoking status: Never   Smokeless tobacco: Never  Vaping Use   Vaping Use: Never used  Substance Use Topics   Alcohol use: Yes    Comment: 6 beers per month   Drug use: No    Immunization History  Administered Date(s) Administered   Hepb-cpg 06/25/2022, 07/22/2022   Tdap 04/26/2019    Objective:  BP 127/68 (BP Location: Right Arm, Patient Position: Sitting)   Pulse (!) 51   Temp 98.7 F (37.1 C) (Temporal)   Resp 14   Ht _0  (1.803 m)   Wt (!) 385 lb (174.6 kg)   SpO2 98%   BMI 53.70 kg/m  Body mass index is 53.7 kg/m.   Physical Exam   Photographs Taken: No  images are attached to the encounter or orders placed in the encounter.   Results Reviewed: Results for orders placed or performed in visit on 09/30/22  Comp Met (CMET)  Result Value Ref Range   Sodium 142 135 - 145 mEq/L   Potassium 4.1 3.5 - 5.1 mEq/L   Chloride 109 96 - 112 mEq/L   CO2 27 19 - 32 mEq/L   Glucose, Bld 92 70 - 99 mg/dL   BUN 27 (H) 6 - 23 mg/dL   Creatinine, Ser 2.51 (H) 0.40 - 1.50 mg/dL   Total Bilirubin 1.2 0.2 - 1.2 mg/dL   Alkaline Phosphatase 161 (H) 39 - 117 U/L   AST 32 0 - 37 U/L   ALT 29 0 - 53 U/L   Total Protein 6.8 6.0 - 8.3 g/dL   Albumin 3.5 3.5 - 5.2 g/dL   GFR 29.24 (L) >60.00 mL/min   Calcium 9.6 8.4 - 10.5 mg/dL  Hemoglobin A1c  Result Value Ref Range   Hgb A1c MFr Bld 5.2 4.6 - 6.5 %

## 2022-10-14 ENCOUNTER — Encounter: Payer: Self-pay | Admitting: Gastroenterology

## 2022-10-15 NOTE — Telephone Encounter (Signed)
   Sean [name] 1971-04-02 [id number]  Dear [name], PA:  We have scheduled the above named patient for a(n) upper endoscopy procedure. Our records show that (s)he is on anticoagulation therapy.  Please advise as to whether the patient may come off their therapy of Eliquis 2 days prior to their procedure which is scheduled for Tuesday 11/25/22.  Please route your response to Caryl Asp, [place] or fax response to [phone].  Sincerely,   Caryl Asp, [facility]

## 2022-10-17 NOTE — Telephone Encounter (Signed)
Unable to leave message as voicemail box not set up.

## 2022-10-18 ENCOUNTER — Other Ambulatory Visit: Payer: Self-pay | Admitting: Physician Assistant

## 2022-10-18 DIAGNOSIS — I2510 Atherosclerotic heart disease of native coronary artery without angina pectoris: Secondary | ICD-10-CM

## 2022-10-18 DIAGNOSIS — I10 Essential (primary) hypertension: Secondary | ICD-10-CM

## 2022-10-18 DIAGNOSIS — E7849 Other hyperlipidemia: Secondary | ICD-10-CM

## 2022-10-28 ENCOUNTER — Telehealth: Payer: Self-pay | Admitting: Internal Medicine

## 2022-10-28 ENCOUNTER — Ambulatory Visit: Payer: BC Managed Care – PPO | Admitting: Gastroenterology

## 2022-10-28 NOTE — Telephone Encounter (Signed)
Zack from cover my Meds stated Pharmacy started a wegovty prior auth and they need to know the status of this request -  Please call [phone] with Ref # [id number] and status of form

## 2022-10-29 NOTE — Telephone Encounter (Signed)
Patient informed he may hold Eliquis.

## 2022-11-04 ENCOUNTER — Other Ambulatory Visit: Payer: Self-pay | Admitting: Gastroenterology

## 2022-11-04 ENCOUNTER — Other Ambulatory Visit: Payer: Self-pay | Admitting: Physician Assistant

## 2022-11-13 ENCOUNTER — Encounter: Payer: Self-pay | Admitting: Internal Medicine

## 2022-11-13 ENCOUNTER — Telehealth: Payer: Self-pay | Admitting: Pharmacist

## 2022-11-13 ENCOUNTER — Ambulatory Visit (INDEPENDENT_AMBULATORY_CARE_PROVIDER_SITE_OTHER): Payer: BC Managed Care – PPO | Admitting: Internal Medicine

## 2022-11-13 ENCOUNTER — Telehealth: Payer: Self-pay | Admitting: Internal Medicine

## 2022-11-13 DIAGNOSIS — R748 Abnormal levels of other serum enzymes: Secondary | ICD-10-CM

## 2022-11-13 DIAGNOSIS — N184 Chronic kidney disease, stage 4 (severe): Secondary | ICD-10-CM

## 2022-11-13 DIAGNOSIS — M1A09X0 Idiopathic chronic gout, multiple sites, without tophus (tophi): Secondary | ICD-10-CM

## 2022-11-13 DIAGNOSIS — I10 Essential (primary) hypertension: Secondary | ICD-10-CM

## 2022-11-13 DIAGNOSIS — Z6841 Body Mass Index (BMI) 40.0 and over, adult: Secondary | ICD-10-CM

## 2022-11-13 DIAGNOSIS — G4733 Obstructive sleep apnea (adult) (pediatric): Secondary | ICD-10-CM

## 2022-11-13 MED ORDER — ZEPBOUND 2.5 MG/0.5ML ~~LOC~~ SOAJ: 2.5000 mg | SUBCUTANEOUS | 3 refills | Status: DC

## 2022-11-13 MED ORDER — LOSARTAN POTASSIUM 25 MG PO TABS: 25.0000 mg | ORAL_TABLET | Freq: Every day | ORAL | 3 refills | Status: DC

## 2022-11-13 NOTE — Patient Instructions (Addendum)
It was a pleasure seeing you today!  Your health and satisfaction are my top priorities. If you believe your experience today was worthy of a 5-star rating, I'd be grateful for your feedback! Loralee Pacas, MD   CHECKOUT CHECKLIST  []    Schedule next appointment(s):    No follow-ups on file.  I Any requested lab visits should be scheduled as appointments too  If you are not doing well:  Return to the office sooner Please bring all your medicine bottles to each appointment If your condition begins to worsen or become severe:  go to the emergency room or even call 911  []    Sign release of information authorizations: Any records we need for your care and to be your medical home  REMINDERS:  []    Increase home blood pressure monitoring Try to get zepbound Get bloodwork repeat Meet with nephrology and nutrition  []    X-rays can be obtained at the  [facility] office. You can walk in M-F between 8:30am- noon or 1pm - 5pm. Tell them you are there for xrays ordered by me. They will send me the results, then I will let you know the results with instructions. Address: [address].  The Xray department is located in the basement.     []   (Optional):  Review your clinical notes on MyChart after they are completed.     Today's draft of the physician documented plan for today's visit: (final revisions will be visible on MyChart chart later) Morbid obesity ([place]) -     Zepbound; Inject 2.5 mg into the skin once a week.  Dispense: 0.5 mL; Refill: 3  Elevated alkaline phosphatase level -     Alkaline phosphatase, isoenzymes; Future  Chronic kidney disease, stage 4 (severe) (HCC)  Essential hypertension Assessment & Plan: Uncontrolled Start losartan Continue cardizem and toprol He is better on fluid without lasix so stay off   Chronic gout of multiple sites, unspecified cause      QUESTIONS & CONCERNS: CLINICAL: please contact us via phone [phone] OR  MyChart messaging  LAB & IMAGING:   We will call you if the results are significantly abnormal or you don't use MyChart.  Most normal results will be posted to MyChart immediately and have a clinical review message by Dr. Randol Kern posted within 2-3 business days.   If you have not heard from [place] regarding the results in 2 weeks OR if you need priority reporting, please contact this office. MYCHART:  The fastest way to get your results and easiest way to stay in touch with [place] is by activating your My Chart account. Instructions are located on the last page of this paperwork.  BILLING: xray and lab orders are billed from separate companies and questions./concerns should be directed to the [facility].  For visit charges please discuss with our administrative services COMPLAINTS:  please let Dr. Randol Kern know or see the [place], by asking at the front desk: we want you to be satisfied with every experience and we would be grateful for the opportunity to address any problems

## 2022-11-13 NOTE — Assessment & Plan Note (Signed)
Uncontrolled Start losartan Continue cardizem and toprol He is better on fluid without lasix so stay off

## 2022-11-13 NOTE — Assessment & Plan Note (Addendum)
Continue jardiance Adding losartan Continue allopurinol Cause of problem unclear to me yet Recheck labwork Reminded on nephrotoxins Nephro referral is delayed- asked team to work with him to find out why and if roadblocks can be removed

## 2022-11-13 NOTE — Progress Notes (Signed)
[name] [facility]: [phone]   Routine Medical Office Visit  Patient:  Sean [name]      Age: 51 y.o.       Sex:  male  Date:   11/13/2022  PCP:    Loralee Pacas, [place] Provider: Loralee Pacas, MD  Assessment/Plan:   Sean [name] was seen today for 1 month follow-up.  Morbid obesity ([place]) -     Zepbound; Inject 2.5 mg into the skin once a week.  Dispense: 0.5 mL; Refill: 3 -     Amb ref to Medical Nutrition Therapy-MNT  Elevated alkaline phosphatase level -     Alkaline phosphatase, isoenzymes; Future  Chronic kidney disease, stage 4 (severe) (HCC) Overview: Lab Results  Component Value Date/Time   GFR 29.24 (L) 09/30/2022 02:51 PM   GFR 27.27 (L) 09/24/2022 03:40 PM   GFR 24.10 (L) 09/03/2022 03:45 PM   GFR 18.98 (L) 08/25/2022 12:21 PM   GFR 38.98 (L) 04/24/2022 02:39 PM     Assessment & Plan: Continue jardiance Adding losartan Continue allopurinol Cause of problem unclear to me yet Recheck labwork Reminded on nephrotoxins Nephro referral is delayed- asked team to work with him to find out why and if roadblocks can be removed   Orders: -     Basic metabolic panel -     Amb ref to Medical Nutrition Therapy-MNT  Essential hypertension Assessment & Plan: Uncontrolled Start losartan Continue cardizem and toprol He is better on fluid without lasix so stay off   Chronic gout of multiple sites, unspecified cause Overview: Takes no red meat and allopurinol no flare since around 2020   Morbid obesity with BMI of 50.0-59.9, adult ([place]) Overview: After [place] [facility] doctor is setting him up with a wellness clinic to help with weight losS He reports not having tried anything for weight loss either diet or or medicine or surgery in the past We failed to get [facility] 10/2022 Wt Readings from Last 10 Encounters:  11/13/22 (!) 384 lb 12.8 oz (174.5 kg)  10/13/22 (!) 385 lb (174.6 kg)  09/30/22 (!) 384 lb (174.2 kg)   09/24/22 (!) 382 lb (173.3 kg)  09/03/22 (!) 377 lb 3.2 oz (171.1 kg)  07/24/22 (!) 386 lb 3.2 oz (175.2 kg)  06/25/22 (!) 392 lb 4 oz (177.9 kg)  04/24/22 (!) 386 lb (175.1 kg)  03/24/22 (!) 387 lb 9.6 oz (175.8 kg)  03/19/22 (!) 387 lb (175.5 kg)     Assessment & Plan: This is main area of concern Sent in zepbound and explained how to use savings card Asked for 1 month(s) follow up to ensure this is attained started and working, and if not, we need to refer to bariatrics Refffered to nutrition- he needs low protein renal diet, that's also low calorie and ok for gout... Basically a very low calorie diet with plenty of fluid.  No calorie diet with vitamins and heavy fluid intake might be ideal.   Obstructive sleep apnea syndrome Overview: Has CPAP, settings 13 Reports consistent aderence   Other orders -     Losartan Potassium; Take 1 tablet (25 mg total) by mouth daily.  Dispense: 90 tablet; Refill: 3     Today's key discussion points and After Visit Summary (AVS) reminders.  Problem-associated medical records were reviewed with him during the appointment He was encouraged to contact our office by phone or message via MyChart if he has any questions or concerns regarding our treatment plan (  see AVS). He was given an opportunity to ask questions/clarifications about any aspect of the diagnosis and treatment plan at today's visit. Common side effects, risks, benefits, and alternatives for medications and treatment plan prescribed today were discussed. He expressed understanding of the given instructions.  Possible memory  barriers to understanding were identified , his sister assisted by being there with him today    Subjective:   Sean [name] is a 51 y.o. male with the following chart data reviewed: Past Medical History:  Diagnosis Date   Abnormal liver enzymes    a. Sees a doctor in [place].   Cardiac cirrhosis    a. possible elevated LFTs/low platelets felt due  to cardiac cirrhosis per 2017 admission.   Chronic combined systolic and diastolic CHF (congestive heart failure) (HCC)    CKD (chronic kidney disease), stage II    Congenital heart defect    a. rightward rotation of heart, almost dextrocardia   Coronary artery disease    a. s/p CABGx1 in 12/2015.   Hematuria    a. Chronic hx of this, no prior etiology determined through workup per patient.   History of umbilical hernia repair 41/96/2229   History of hernia surgery twice prior   Hypercholesteremia    a. Prev taken off statin due to abnormal liver function.   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   allopurinol (ZYLOPRIM) 100 MG tablet TAKE 1 TABLET(100 MG) BY MOUTH DAILY   amitriptyline (ELAVIL) 25 MG tablet Take 1 tablet (25 mg total) by mouth at bedtime.   apixaban (ELIQUIS) 5 MG TABS tablet Take 1 tablet (5 mg total) by mouth 2 (two) times daily.   azelastine (ASTELIN) 0.1 % nasal spray Place 2 sprays into both nostrils 2 (two) times daily. (Patient taking differently: Place 2 sprays into both nostrils as needed for rhinitis or allergies.)   Cyanocobalamin (VITAMIN B-12) 5000 MCG SUBL Place 5,000 mcg under the tongue daily.   diltiazem (CARDIZEM SR) 120 MG 12 hr capsule TAKE 1 CAPSULE(120 MG) BY MOUTH TWICE DAILY   docusate sodium (STOOL SOFTENER) 100 MG capsule Take 1 capsule (100 mg total) by mouth daily.   empagliflozin (JARDIANCE) 10 MG TABS tablet Take 1 tablet (10 mg total) by mouth daily before breakfast.   Evolocumab (REPATHA SURECLICK) 798 MG/ML SOAJ INJECT THE CONTENTS  OF 1 SYRINGE UNDER THE SKIN EVERY 14 DAYS   famotidine (PEPCID) 20 MG tablet TAKE 1 TABLET(20 MG) BY MOUTH TWICE DAILY   furosemide (LASIX) 40 MG tablet Take 40 mg by mouth daily as needed for fluid. As needed.   ipratropium (ATROVENT) 0.06 % nasal spray Place 2 sprays into both nostrils 4 (four) times daily.   levothyroxine (SYNTHROID) 50 MCG tablet TAKE 1 TABLET(50 MCG) BY MOUTH DAILY BEFORE BREAKFAST   metoprolol succinate (TOPROL-XL) 25 MG 24 hr tablet TAKE 1 TABLET(25 MG) BY MOUTH DAILY   montelukast (SINGULAIR) 10 MG tablet TAKE 1 TABLET(10 MG) BY MOUTH DAILY   nitroGLYCERIN (NITROSTAT) 0.4 MG SL tablet Place  1 tablet (0.4 mg total) under the tongue every 5 (five) minutes as needed for chest pain.   pantoprazole (PROTONIX) 40 MG tablet Take 1 tablet (40 mg total) by mouth 2 (two) times daily.   polyethylene glycol powder (GLYCOLAX/MIRALAX) 17 GM/SCOOP powder Take 1 dose daily. May increase to BID if needed   potassium chloride (KLOR-CON M) 10 MEQ tablet TAKE 1 TABLET(10 MEQ) BY MOUTH DAILY   Semaglutide-Weight Management 0.5 MG/0.5ML SOAJ Inject 0.5 mg into the skin once a week for 28 days.   [START ON 12/10/2022] Semaglutide-Weight Management 1 MG/0.5ML SOAJ Inject 1 mg into the skin once a week for 28 days.   [START ON 01/08/2023] Semaglutide-Weight Management 1.7 MG/0.75ML SOAJ Inject 1.7 mg into the skin once a week for 28 days.   [START ON 02/06/2023] Semaglutide-Weight Management 2.4 MG/0.75ML SOAJ Inject 2.4 mg into the skin once a week for 28 days.   sucralfate (CARAFATE) 1 g tablet TAKE 1 TABLET(1 GRAM) BY MOUTH TWICE DAILY. MAY INCREASE TO 4 TIMES DAILY FOR ANY ACUTE SYMPTOMS   valACYclovir (VALTREX) 500 MG tablet Take 500 mg by mouth 2 (two) times daily.   valACYclovir (VALTREX) 500 MG tablet Take 1 tablet (500 mg total) by mouth daily.   No facility-administered medications prior to visit.     He presented today reporting reason for visit as: Chief Complaint  Patient presents  with   1 month follow-up    Everything is about the same, slightly better.     Problem-focused charting was used to record today's medical interview as problems and problem overview medical record updates as follows: Problem  Chronic Gout of Multiple Sites   Takes no red meat and allopurinol no flare since around 2020   Morbid Obesity (Hcc)  Obstructive Sleep Apnea Syndrome   Has CPAP, settings 13 Reports consistent aderence   Chronic Kidney Disease, Stage 4 (Severe) (Hcc)   Lab Results  Component Value Date/Time   GFR 29.24 (L) 09/30/2022 02:51 PM   GFR 27.27 (L) 09/24/2022 03:40 PM   GFR 24.10 (L) 09/03/2022 03:45 PM   GFR 18.98 (L) 08/25/2022 12:21 PM   GFR 38.98 (L) 04/24/2022 02:39 PM      Morbid Obesity With Bmi of 50.0-59.9, Adult (Hcc)   After [facility] doctor is setting him up with a wellness clinic to help with weight losS He reports not having tried anything for weight loss either diet or or medicine or surgery in the past We failed to get [facility] 10/2022 Wt Readings from Last 10 Encounters:  11/13/22 (!) 384 lb 12.8 oz (174.5 kg)  10/13/22 (!) 385 lb (174.6 kg)  09/30/22 (!) 384 lb (174.2 kg)  09/24/22 (!) 382 lb (173.3 kg)  09/03/22 (!) 377 lb 3.2 oz (171.1 kg)  07/24/22 (!) 386 lb 3.2 oz (175.2 kg)  06/25/22 (!) 392 lb 4 oz (177.9 kg)  04/24/22 (!) 386 lb (175.1 kg)  03/24/22 (!) 387 lb 9.6 oz (175.8 kg)  03/19/22 (!) 387 lb (175.5 kg)     Essential Hypertension  Pseudoaneurysm Following Procedure (Hcc) (Resolved)   To rule out             Objective:  Physical Exam  BP (!) 154/70 (BP Location: Right Arm, Patient Position: Sitting)   Pulse (!) 52   Temp 98.6 F (37 C) (Temporal)   Resp 14   Ht _0  (1.803 m)   Wt (!) 384 lb 12.8 oz (174.5 kg)   SpO2 96%  BMI 53.67 kg/m  Vital signs reviewed.  Nursing notes reviewed. General Appearance/Constitutional:  Severely obese male in no acute distress Musculoskeletal: All extremities are intact.   Neurological:  Awake, alert,  No obvious focal neurological deficits or cognitive impairments Psychiatric:  Appropriate mood, pleasant demeanor Severe truncal obesity  Results Reviewed:  No results found for any visits on 11/13/22.   Recent Results (from the past 2160 hour(s))  Comp Met (CMET)     Status: Abnormal   Collection Time: 08/25/22 12:21 PM  Result Value Ref Range   Sodium 141 135 - 145 mEq/L   Potassium 3.5 3.5 - 5.1 mEq/L   Chloride 103 96 - 112 mEq/L   CO2 26 19 - 32 mEq/L   Glucose, Bld 132 (H) 70 - 99 mg/dL   BUN 48 (H) 6 - 23 mg/dL   Creatinine, Ser 3.60 (H) 0.40 - 1.50 mg/dL   Total Bilirubin 9.2 (H) 0.2 - 1.2 mg/dL   Alkaline Phosphatase 429 (H) 39 - 117 U/L   AST 81 (H) 0 - 37 U/L   ALT 98 (H) 0 - 53 U/L   Total Protein 6.5 6.0 - 8.3 g/dL   Albumin 3.0 (L) 3.5 - 5.2 g/dL   GFR 18.98 (L) >60.00 mL/min    Comment: Calculated using the CKD-EPI Creatinine Equation (2021)   Calcium 9.3 8.4 - 10.5 mg/dL  CBC w/Diff     Status: Abnormal   Collection Time: 08/25/22 12:21 PM  Result Value Ref Range   WBC 6.8 4.0 - 10.5 K/uL   RBC 3.39 (L) 4.22 - 5.81 Mil/uL   Hemoglobin 11.5 (L) 13.0 - 17.0 g/dL   HCT 33.3 (L) 39.0 - 52.0 %   MCV 98.1 78.0 - 100.0 fl   MCHC 34.6 30.0 - 36.0 g/dL   RDW 15.6 (H) 11.5 - 15.5 %   Platelets 94.0 (L) 150.0 - 400.0 K/uL   Neutrophils Relative % 67.0 43.0 - 77.0 %   Lymphocytes Relative 18.9 12.0 - 46.0 %   Monocytes Relative 11.3 3.0 - 12.0 %   Eosinophils Relative 2.0 0.0 - 5.0 %   Basophils Relative 0.8 0.0 - 3.0 %   Neutro Abs 4.6 1.4 - 7.7 K/uL   Lymphs Abs 1.3 0.7 - 4.0 K/uL   Monocytes Absolute 0.8 0.1 - 1.0 K/uL   Eosinophils Absolute 0.1 0.0 - 0.7 K/uL   Basophils Absolute 0.1 0.0 - 0.1 K/uL  CBC with Differential/Platelet     Status: Abnormal   Collection Time: 09/03/22  3:45 PM  Result Value Ref Range   WBC 4.7 4.0 - 10.5 K/uL   RBC 3.19 (L) 4.22 - 5.81 Mil/uL   Hemoglobin 10.9 (L) 13.0 - 17.0 g/dL   HCT 32.5 (L)  39.0 - 52.0 %   MCV 101.8 (H) 78.0 - 100.0 fl   MCHC 33.7 30.0 - 36.0 g/dL   RDW 17.7 (H) 11.5 - 15.5 %   Platelets 84.0 (L) 150.0 - 400.0 K/uL   Neutrophils Relative % 52.5 43.0 - 77.0 %   Lymphocytes Relative 27.0 12.0 - 46.0 %   Monocytes Relative 15.0 (H) 3.0 - 12.0 %   Eosinophils Relative 4.6 0.0 - 5.0 %   Basophils Relative 0.9 0.0 - 3.0 %   Neutro Abs 2.5 1.4 - 7.7 K/uL   Lymphs Abs 1.3 0.7 - 4.0 K/uL   Monocytes Absolute 0.7 0.1 - 1.0 K/uL   Eosinophils Absolute 0.2 0.0 - 0.7 K/uL   Basophils Absolute 0.0 0.0 -  0.1 K/uL  Iron, TIBC and Ferritin Panel     Status: None   Collection Time: 09/03/22  3:45 PM  Result Value Ref Range   Iron 108 50 - 180 mcg/dL   TIBC 368 250 - 425 mcg/dL (calc)   %SAT 29 20 - 48 % (calc)   Ferritin 250 38 - 380 ng/mL  Renal function panel     Status: Abnormal   Collection Time: 09/03/22  3:45 PM  Result Value Ref Range   Sodium 141 135 - 145 mEq/L   Potassium 4.2 3.5 - 5.1 mEq/L   Chloride 106 96 - 112 mEq/L   CO2 27 19 - 32 mEq/L   Albumin 3.1 (L) 3.5 - 5.2 g/dL   BUN 34 (H) 6 - 23 mg/dL   Creatinine, Ser 2.95 (H) 0.40 - 1.50 mg/dL   Glucose, Bld 125 (H) 70 - 99 mg/dL   Phosphorus 4.4 2.3 - 4.6 mg/dL   GFR 24.10 (L) >60.00 mL/min    Comment: Calculated using the CKD-EPI Creatinine Equation (2021)   Calcium 9.5 8.4 - 10.5 mg/dL  Hepatic function panel     Status: Abnormal   Collection Time: 09/03/22  3:45 PM  Result Value Ref Range   Total Bilirubin 3.9 (H) 0.2 - 1.2 mg/dL   Bilirubin, Direct 1.9 (H) 0.0 - 0.3 mg/dL   Alkaline Phosphatase 369 (H) 39 - 117 U/L   AST 66 (H) 0 - 37 U/L   ALT 65 (H) 0 - 53 U/L   Total Protein 6.6 6.0 - 8.3 g/dL   Albumin 3.1 (L) 3.5 - 5.2 g/dL  TSH     Status: None   Collection Time: 09/03/22  3:45 PM  Result Value Ref Range   TSH 2.96 0.35 - 5.50 uIU/mL  POCT urinalysis dipstick     Status: Abnormal   Collection Time: 09/03/22  4:05 PM  Result Value Ref Range   Color, UA yellow    Clarity, UA  clear    Glucose, UA Negative Negative   Bilirubin, UA negative    Ketones, UA negative    Spec Grav, UA 1.015 1.010 - 1.025   Blood, UA negative    pH, UA 6.0 5.0 - 8.0   Protein, UA Positive (A) Negative   Urobilinogen, UA 0.2 0.2 or 1.0 E.U./dL   Nitrite, UA negative    Leukocytes, UA Negative Negative   Appearance     Odor    CBC with Differential/Platelet     Status: Abnormal   Collection Time: 09/24/22  3:40 PM  Result Value Ref Range   WBC 5.9 4.0 - 10.5 K/uL   RBC 3.33 (L) 4.22 - 5.81 Mil/uL   Hemoglobin 11.4 (L) 13.0 - 17.0 g/dL   HCT 33.3 (L) 39.0 - 52.0 %   MCV 100.1 (H) 78.0 - 100.0 fl   MCHC 34.1 30.0 - 36.0 g/dL   RDW 16.8 (H) 11.5 - 15.5 %   Platelets 62.0 (L) 150.0 - 400.0 K/uL   Neutrophils Relative % 56.5 43.0 - 77.0 %   Lymphocytes Relative 27.8 12.0 - 46.0 %   Monocytes Relative 10.8 3.0 - 12.0 %   Eosinophils Relative 4.6 0.0 - 5.0 %   Basophils Relative 0.3 0.0 - 3.0 %   Neutro Abs 3.3 1.4 - 7.7 K/uL   Lymphs Abs 1.6 0.7 - 4.0 K/uL   Monocytes Absolute 0.6 0.1 - 1.0 K/uL   Eosinophils Absolute 0.3 0.0 - 0.7 K/uL   Basophils Absolute  0.0 0.0 - 0.1 K/uL  Comp Met (CMET)     Status: Abnormal   Collection Time: 09/24/22  3:40 PM  Result Value Ref Range   Sodium 142 135 - 145 mEq/L   Potassium 4.3 3.5 - 5.1 mEq/L   Chloride 109 96 - 112 mEq/L   CO2 26 19 - 32 mEq/L   Glucose, Bld 146 (H) 70 - 99 mg/dL   BUN 24 (H) 6 - 23 mg/dL   Creatinine, Ser 2.66 (H) 0.40 - 1.50 mg/dL   Total Bilirubin 1.3 (H) 0.2 - 1.2 mg/dL   Alkaline Phosphatase 156 (H) 39 - 117 U/L   AST 33 0 - 37 U/L   ALT 30 0 - 53 U/L   Total Protein 6.2 6.0 - 8.3 g/dL   Albumin 3.2 (L) 3.5 - 5.2 g/dL   GFR 27.27 (L) >60.00 mL/min    Comment: Calculated using the CKD-EPI Creatinine Equation (2021)   Calcium 8.7 8.4 - 10.5 mg/dL  Comp Met (CMET)     Status: Abnormal   Collection Time: 09/30/22  2:51 PM  Result Value Ref Range   Sodium 142 135 - 145 mEq/L   Potassium 4.1 3.5 - 5.1  mEq/L   Chloride 109 96 - 112 mEq/L   CO2 27 19 - 32 mEq/L   Glucose, Bld 92 70 - 99 mg/dL   BUN 27 (H) 6 - 23 mg/dL   Creatinine, Ser 2.51 (H) 0.40 - 1.50 mg/dL   Total Bilirubin 1.2 0.2 - 1.2 mg/dL   Alkaline Phosphatase 161 (H) 39 - 117 U/L   AST 32 0 - 37 U/L   ALT 29 0 - 53 U/L   Total Protein 6.8 6.0 - 8.3 g/dL   Albumin 3.5 3.5 - 5.2 g/dL   GFR 29.24 (L) >60.00 mL/min    Comment: Calculated using the CKD-EPI Creatinine Equation (2021)   Calcium 9.6 8.4 - 10.5 mg/dL  Hemoglobin A1c     Status: None   Collection Time: 09/30/22  2:51 PM  Result Value Ref Range   Hgb A1c MFr Bld 5.2 4.6 - 6.5 %    Comment: Glycemic Control Guidelines for People with Diabetes:Non Diabetic:  <6%Goal of Therapy: <7%Additional Action Suggested:  >8%       Loralee Pacas, MD

## 2022-11-13 NOTE — Telephone Encounter (Signed)
Patient has been scheduled a lab only visit for 12/27. Would like to know if results would be affected by endoscopy that he is having on 12/26. Requests a callback for confirmation. Please Advise.

## 2022-11-13 NOTE — Assessment & Plan Note (Addendum)
This is main area of concern Sent in zepbound and explained how to use savings card Asked for 1 month(s) follow up to ensure this is attained started and working, and if not, we need to refer to bariatrics Refffered to nutrition- he needs low protein renal diet, that's also low calorie and ok for gout... Basically a very low calorie diet with plenty of fluid.  No calorie diet with vitamins and heavy fluid intake might be ideal.

## 2022-11-13 NOTE — Telephone Encounter (Signed)
[facility] PA renewal submitted Key: [id number]

## 2022-11-17 ENCOUNTER — Other Ambulatory Visit: Payer: Self-pay

## 2022-11-17 ENCOUNTER — Telehealth: Payer: Self-pay | Admitting: Gastroenterology

## 2022-11-17 ENCOUNTER — Encounter (HOSPITAL_COMMUNITY): Payer: Self-pay | Admitting: Gastroenterology

## 2022-11-17 NOTE — Telephone Encounter (Signed)
Sean [name] called from [facility] asking for clearance [phone].

## 2022-11-17 NOTE — Progress Notes (Signed)
Spoke with Janett Billow at [facility] . Per Janett Billow Ward PA-C pt. Needs cardiac clearance for procedure 11-25-22. Janett Billow leaving a  message with Bonner Puna, RN [name] nurse.

## 2022-11-18 ENCOUNTER — Telehealth: Payer: Self-pay | Admitting: *Deleted

## 2022-11-18 NOTE — Telephone Encounter (Signed)
   Name: Sean [name]  DOB: 1971-01-07  MRN: [id number]  Primary Cardiologist: Werner Lean, MD   Preoperative team, please contact this patient and set up a phone call appointment for further preoperative risk assessment. Please obtain consent and complete medication review. Thank you for your help.  I confirm that guidance regarding antiplatelet and oral anticoagulation therapy has been completed and, if necessary, noted below.  Per office protocol, pt may hold Eliquis for 2 days prior to procedure.    Lenna Sciara, NP 11/18/2022, 1:33 PM [place]

## 2022-11-18 NOTE — Telephone Encounter (Signed)
Patient with diagnosis of afib on Eliquis for anticoagulation.    Procedure: EGD Date of procedure: 11/25/22   CHA2DS2-VASc Score = 3   This indicates a 3.2% annual risk of stroke. The patient's score is based upon: CHF History: 1 HTN History: 1 Diabetes History: 0 Stroke History: 0 Vascular Disease History: 1 Age Score: 0 Gender Score: 0      CrCl 57.9 ml/min Platelet count 62  Per office protocol, patient can hold Eliquis for 2 days prior to procedure.    **This guidance is not considered finalized until pre-operative APP has relayed final recommendations.**

## 2022-11-18 NOTE — Telephone Encounter (Signed)
Spoke with patient and let him know that this is fine per your advice. Rescheduled his lab visit for the same day as the endoscopy at 11:15 am (is this still okay?)

## 2022-11-18 NOTE — Telephone Encounter (Signed)
I called the pt on his primary # though vm full. I then called the other # on file for the pt, which home # for the pt's father. Pt's father did take the message that the pt needs to schedule a tele visit for preop clearance. Pt's dad said he will have the pt call back and set up tele visit for pre op.

## 2022-11-18 NOTE — Telephone Encounter (Signed)
 [facility] Pre-operative Risk Assessment     Request for surgical clearance:     Endoscopy Procedure  What type of surgery is being performed?     EGD  When is this surgery scheduled?     11/25/22  What type of clearance is required ?  Cardiac  Practice name and name of physician performing surgery?      [facility]  What is your office phone and fax number?      Phone- [phone]  [phone]  Anesthesia type (None, local, MAC, general) ?       MAC

## 2022-11-18 NOTE — Telephone Encounter (Signed)
I received clearance on patient Eliquis from his PCP Randol Kern, MD). Anesthesia is requesting cardiac clearance.

## 2022-11-18 NOTE — Telephone Encounter (Signed)
Left message for Sean [name] to call back.

## 2022-11-19 ENCOUNTER — Telehealth: Payer: Self-pay | Admitting: *Deleted

## 2022-11-19 NOTE — Telephone Encounter (Signed)
Spoke with Morey Hummingbird at [facility], anesthesia requesting cardiac clearance as well. Request sent.

## 2022-11-19 NOTE — Telephone Encounter (Signed)
Pt set for tele pre op appt 11/20/22 @ 9:40. Med rec and consent are done. 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

## 2022-11-19 NOTE — Telephone Encounter (Signed)
Pt set for tele pre op appt 11/20/22 @ 9:40. Med rec and consent are done. 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     Patient Consent for Virtual Visit        Zhion [name] Calaway has provided verbal consent on 11/19/2022 for a virtual visit (video or telephone).   CONSENT FOR VIRTUAL VISIT FOR:  Larue [name] Michetti  By participating in this virtual visit I agree to the following:  I hereby voluntarily request, consent and authorize [facility] and its employed or contracted physicians, physician assistants, nurse practitioners or other licensed health care professionals (the Practitioner), to provide me with telemedicine health care services (the "Services") as deemed necessary by the treating Practitioner. I acknowledge and consent to receive the Services by the Practitioner via telemedicine. I understand that the telemedicine visit will involve communicating with the Practitioner through live audiovisual communication technology and the disclosure of certain medical information by electronic transmission. I acknowledge that I have been given the opportunity to request an in-person assessment or other available alternative prior to the telemedicine visit and am voluntarily participating in the telemedicine visit.  I understand that I have the right to withhold or withdraw my consent to the use of telemedicine in the course of my care at any time, without affecting my right to future care or treatment, and that the Practitioner or I may terminate the telemedicine visit at any time. I understand that I have the right to inspect all information obtained and/or recorded in the course of the telemedicine visit and may receive copies of available information for a reasonable fee.  I understand that some of the potential risks of receiving the Services via telemedicine include:  Delay or interruption in medical evaluation due to  technological equipment failure or disruption; Information transmitted may not be sufficient (e.g. poor resolution of images) to allow for appropriate medical decision making by the Practitioner; and/or  In rare instances, security protocols could fail, causing a breach of personal health information.  Furthermore, I acknowledge that it is my responsibility to provide information about my medical history, conditions and care that is complete and accurate to the best of my ability. I acknowledge that Practitioner's advice, recommendations, and/or decision may be based on factors not within their control, such as incomplete or inaccurate data provided by me or distortions of diagnostic images or specimens that may result from electronic transmissions. I understand that the practice of medicine is not an exact science and that Practitioner makes no warranties or guarantees regarding treatment outcomes. I acknowledge that a copy of this consent can be made available to me via my patient portal ([facility]), or I can request a printed copy by calling the office of [facility].    I understand that my insurance will be billed for this visit.   I have read or had this consent read to me. I understand the contents of this consent, which adequately explains the benefits and risks of the Services being provided via telemedicine.  I have been provided ample opportunity to ask questions regarding this consent and the Services and have had my questions answered to my satisfaction. I give my informed consent for the services to be provided through the use of telemedicine in my medical care

## 2022-11-20 ENCOUNTER — Ambulatory Visit: Payer: BC Managed Care – PPO | Attending: Internal Medicine | Admitting: General Practice

## 2022-11-20 DIAGNOSIS — Z01810 Encounter for preprocedural cardiovascular examination: Secondary | ICD-10-CM | POA: Diagnosis not present

## 2022-11-20 NOTE — Progress Notes (Signed)
Virtual Visit via Telephone Note   Because of Sean [name]'s co-morbid illnesses, he is at least at moderate risk for complications without adequate follow up.  This format is felt to be most appropriate for this patient at this time.  The patient did not have access to video technology/had technical difficulties with video requiring transitioning to audio format only (telephone).  All issues noted in this document were discussed and addressed.  No physical exam could be performed with this format.  Please refer to the patient's chart for his consent to telehealth for [facility].  Evaluation Performed:  Preoperative cardiovascular risk assessment _____________   Date:  11/20/2022   Patient ID:  Sean [name], DOB September 04, 1971, MRN [id number] Patient Location:  Home Provider location:   Office  Primary Care Provider:  Loralee Pacas, MD Primary Cardiologist:  Werner Lean, MD  Chief Complaint / Patient Profile   51 y.o. y/o male with a h/o coronary artery disease, HTN, HLD, morbid obesity, PAF who is pending EGD and presents today for telephonic preoperative cardiovascular risk assessment.  History of Present Illness    Sean [name] is a 51 y.o. male who presents via audio/video conferencing for a telehealth visit today.  Pt was last seen in cardiology clinic on 02/10/2022 by Dr. Gasper Sells.  At that time Satoshi [name] Knies was doing well .  The patient is now pending procedure as outlined above. Since his last visit, he remains stable from a cardiac standpoint.  Today he denies chest pain, shortness of breath, lower extremity edema, fatigue, palpitations, melena, hematuria, hemoptysis, diaphoresis, weakness, presyncope, syncope, orthopnea, and PND.   Past Medical History    Past Medical History:  Diagnosis Date   Abnormal liver enzymes    a. Sees a doctor in [place].   Cardiac cirrhosis    a. possible elevated LFTs/low platelets felt  due to cardiac cirrhosis per 2017 admission.   Chronic combined systolic and diastolic CHF (congestive heart failure) (HCC)    CKD (chronic kidney disease), stage II    Stage 4   Congenital heart defect    a. rightward rotation of heart, almost dextrocardia   Coronary artery disease    a. s/p CABGx1 in 12/2015.   Hematuria    a. Chronic hx of this, no prior etiology determined through workup per patient.   History of umbilical hernia repair 65/01/5464   History of hernia surgery twice prior   Hypercholesteremia    a. Prev taken off statin due to abnormal liver function.   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   LIMA to LAD   Sinus bradycardia    Thrombocytopenia ([place])    Past Surgical History:  Procedure Laterality Date   APPENDECTOMY     BIOPSY  02/19/2021   Procedure: BIOPSY;  Surgeon: Thornton Park, MD;  Location: [facility];  Service: Gastroenterology;;   CARDIAC CATHETERIZATION N/A 12/24/2015   Procedure: Right/Left Heart Cath and Coronary Angiography;  Surgeon: Jolaine Artist, MD;  Location: [place] [facility];  Service: Cardiovascular;  Laterality: N/A;   CARDIOVERSION N/A 02/14/2016   Procedure: CARDIOVERSION;  Surgeon: Pixie Casino, MD;  Location: [facility] ENDOSCOPY;  Service: Cardiovascular;  Laterality: N/A;   COLONOSCOPY WITH PROPOFOL N/A 02/19/2021   Procedure: COLONOSCOPY  WITH PROPOFOL;  Surgeon: Thornton Park, MD;  Location: [facility];  Service: Gastroenterology;  Laterality: N/A;   CORONARY ARTERY BYPASS GRAFT N/A 12/26/2015   Procedure: Off Pump Coronary Artery Bypass Grafting times one using left internal mammary artery;  Surgeon: Rexene Alberts, MD;  Location: [place];  Service: Open Heart Surgery;  Laterality: N/A;   ESOPHAGOGASTRODUODENOSCOPY (EGD) WITH PROPOFOL N/A 02/19/2021    Procedure: ESOPHAGOGASTRODUODENOSCOPY (EGD) WITH PROPOFOL;  Surgeon: Thornton Park, MD;  Location: [facility];  Service: Gastroenterology;  Laterality: N/A;   GALLBLADDER SURGERY     HERNIA REPAIR     LEFT HEART CATH AND CORS/GRAFTS ANGIOGRAPHY N/A 04/15/2017   Procedure: Left Heart Cath and Cors/Grafts Angiography;  Surgeon: Troy Sine, MD;  Location: [place] [facility];  Service: Cardiovascular;  Laterality: N/A;   LEFT HEART CATH AND CORS/GRAFTS ANGIOGRAPHY N/A 11/02/2020   Procedure: LEFT HEART CATH AND CORS/GRAFTS ANGIOGRAPHY;  Surgeon: Sherren Mocha, MD;  Location: [place] [facility];  Service: Cardiovascular;  Laterality: N/A;   POLYPECTOMY  02/19/2021   Procedure: POLYPECTOMY;  Surgeon: Thornton Park, MD;  Location: [facility];  Service: Gastroenterology;;   TEE WITHOUT CARDIOVERSION N/A 12/26/2015   Procedure: TRANSESOPHAGEAL ECHOCARDIOGRAM (TEE);  Surgeon: Rexene Alberts, MD;  Location: [place];  Service: Open Heart Surgery;  Laterality: N/A;   TEE WITHOUT CARDIOVERSION N/A 02/14/2016   Procedure: TRANSESOPHAGEAL ECHOCARDIOGRAM (TEE);  Surgeon: Pixie Casino, MD;  Location: [facility] ENDOSCOPY;  Service: Cardiovascular;  Laterality: N/A;    Allergies  Allergies  Allergen Reactions   Doxycycline Shortness Of Breath   Imdur [Isosorbide Dinitrate] Diarrhea    Pt states causes "diarrhea."   Augmentin [Amoxicillin-Pot Clavulanate] Itching   Tape Rash    Home Medications    Prior to Admission medications   Medication Sig Start Date End Date Taking? Authorizing Provider  acetaminophen (TYLENOL) 500 MG tablet Take 500-1,500 mg by mouth as needed for moderate pain, fever, headache or mild pain.    [provider]  allopurinol (ZYLOPRIM) 100 MG tablet TAKE 1 TABLET(100 MG) BY MOUTH DAILY 10/15/22   Loralee Pacas, MD  amitriptyline (ELAVIL) 25 MG tablet Take 1 tablet (25 mg total) by mouth at bedtime. 09/30/22   Thornton Park, MD  apixaban (ELIQUIS) 5 MG TABS  tablet Take 1 tablet (5 mg total) by mouth 2 (two) times daily. 04/17/22   [name], PA  azelastine (ASTELIN) 0.1 % nasal spray Place 2 sprays into both nostrils 2 (two) times daily. Patient not taking: Reported on 11/19/2022 02/04/22   Vivi Barrack, MD  Cyanocobalamin (VITAMIN B-12) 5000 MCG SUBL Place 5,000 mcg under the tongue daily.    [provider]  diltiazem (CARDIZEM SR) 120 MG 12 hr capsule TAKE 1 CAPSULE(120 MG) BY MOUTH TWICE DAILY 11/04/22   Loralee Pacas, MD  docusate sodium (STOOL SOFTENER) 100 MG capsule Take 1 capsule (100 mg total) by mouth daily. 02/26/22   Thornton Park, MD  empagliflozin (JARDIANCE) 10 MG TABS tablet Take 1 tablet (10 mg total) by mouth daily before breakfast. 10/13/22   Loralee Pacas, MD  Evolocumab (REPATHA SURECLICK) 559 MG/ML SOAJ INJECT THE CONTENTS OF 1 SYRINGE UNDER THE SKIN EVERY 14 DAYS 01/13/22   Freada Bergeron, MD  famotidine (PEPCID) 20 MG tablet TAKE 1 TABLET(20 MG) BY MOUTH TWICE DAILY 11/05/22   Thornton Park, MD  furosemide (LASIX) 40 MG tablet Take 40 mg by mouth daily as needed for fluid. As needed.  [provider]  ipratropium (ATROVENT) 0.06 % nasal spray Place 2 sprays into both nostrils 4 (four) times daily. 12/10/21   [name], PA  levothyroxine (SYNTHROID) 50 MCG tablet TAKE 1 TABLET(50 MCG) BY MOUTH DAILY BEFORE BREAKFAST 10/15/22   Loralee Pacas, MD  losartan (COZAAR) 25 MG tablet Take 1 tablet (25 mg total) by mouth daily. 11/13/22   Loralee Pacas, MD  metoprolol succinate (TOPROL-XL) 25 MG 24 hr tablet TAKE 1 TABLET(25 MG) BY MOUTH DAILY 10/15/22   Loralee Pacas, MD  montelukast (SINGULAIR) 10 MG tablet TAKE 1 TABLET(10 MG) BY MOUTH DAILY 10/20/22   Loralee Pacas, MD  nitroGLYCERIN (NITROSTAT) 0.4 MG SL tablet Place 1 tablet (0.4 mg total) under the tongue every 5 (five) minutes as needed for chest pain. 02/10/22   Chandrasekhar, Lyda Kalata A, MD  pantoprazole (PROTONIX) 40 MG  tablet Take 1 tablet (40 mg total) by mouth 2 (two) times daily. 11/01/21   [name], PA  polyethylene glycol powder (GLYCOLAX/MIRALAX) 17 GM/SCOOP powder Take 1 dose daily. May increase to BID if needed 02/26/22   Thornton Park, MD  potassium chloride (KLOR-CON M) 10 MEQ tablet TAKE 1 TABLET(10 MEQ) BY MOUTH DAILY 10/20/22   Loralee Pacas, MD  Semaglutide-Weight Management 0.5 MG/0.5ML SOAJ Inject 0.5 mg into the skin once a week for 28 days. Patient not taking: Reported on 11/19/2022 11/11/22 12/09/22  Loralee Pacas, MD  Semaglutide-Weight Management 1 MG/0.5ML SOAJ Inject 1 mg into the skin once a week for 28 days. Patient not taking: Reported on 11/17/2022 12/10/22 01/07/23  Loralee Pacas, MD  Semaglutide-Weight Management 1.7 MG/0.75ML SOAJ Inject 1.7 mg into the skin once a week for 28 days. Patient not taking: Reported on 11/17/2022 01/08/23 02/05/23  Loralee Pacas, MD  Semaglutide-Weight Management 2.4 MG/0.75ML SOAJ Inject 2.4 mg into the skin once a week for 28 days. Patient not taking: Reported on 11/17/2022 02/06/23 03/06/23  Loralee Pacas, MD  sucralfate (CARAFATE) 1 g tablet TAKE 1 TABLET(1 GRAM) BY MOUTH TWICE DAILY. MAY INCREASE TO 4 TIMES DAILY FOR ANY ACUTE SYMPTOMS 10/09/22   Thornton Park, MD  Tirzepatide-Weight Management (ZEPBOUND) 2.5 MG/0.5ML SOAJ Inject 2.5 mg into the skin once a week. Patient not taking: Reported on 11/17/2022 11/13/22   Loralee Pacas, MD  valACYclovir (VALTREX) 500 MG tablet Take 500 mg by mouth 2 (two) times daily. Patient not taking: Reported on 11/19/2022 08/25/22   [provider]  valACYclovir (VALTREX) 500 MG tablet Take 1 tablet (500 mg total) by mouth daily. Patient not taking: Reported on 11/19/2022 10/13/22   Loralee Pacas, MD    Physical Exam    Vital Signs:  Gaines [name] Birman does not have vital signs available for review today.  Given telephonic nature of communication, physical exam is limited. AAOx3.  NAD. Normal affect.  Speech and respirations are unlabored.  Accessory Clinical Findings    None  Assessment & Plan    1.  Preoperative Cardiovascular Risk Assessment: EGD, [place] gastro[facility]      Primary Cardiologist: Werner Lean, MD  Chart reviewed as part of pre-operative protocol coverage. Given past medical history and time since last visit, based on ACC/AHA guidelines, Commodore [name] Gabrielsen would be at acceptable risk for the planned procedure without further cardiovascular testing.   Patient was advised that if he develops new symptoms prior to surgery to contact our office to arrange a follow-up appointment.  He verbalized understanding.  Patient  with diagnosis of afib on Eliquis for anticoagulation.     Procedure: EGD Date of procedure: 11/25/22     CHA2DS2-VASc Score = 3   This indicates a 3.2% annual risk of stroke. The patient's score is based upon: CHF History: 1 HTN History: 1 Diabetes History: 0 Stroke History: 0 Vascular Disease History: 1 Age Score: 0 Gender Score: 0       CrCl 57.9 ml/min Platelet count 62   Per office protocol, patient can hold Eliquis for 2 days prior to procedure.  I will route this recommendation to the requesting party via Epic fax function and remove from pre-op pool.      Time:   Today, I have spent 5 minutes with the patient with telehealth technology discussing medical history, symptoms, and management plan.  Prior to his phone evaluation I spent greater than 10 minutes reviewing his past medical history and cardiac medications.   Deberah Pelton, NP  11/20/2022, 9:05 AM

## 2022-11-24 NOTE — Anesthesia Preprocedure Evaluation (Signed)
Anesthesia Evaluation  Patient identified by MRN, date of birth, ID band Patient awake    Reviewed: Allergy & Precautions, NPO status , Patient's Chart, lab work & pertinent test results, reviewed documented beta blocker date and time   History of Anesthesia Complications Negative for: history of anesthetic complications  Airway Mallampati: II   Neck ROM: Full    Dental  (+) Poor Dentition   Pulmonary sleep apnea and Continuous Positive Airway Pressure Ventilation    Pulmonary exam normal        Cardiovascular hypertension, Pt. on home beta blockers and Pt. on medications + CAD, + CABG (2017) and +CHF  Normal cardiovascular exam+ dysrhythmias (on Eliquis) Atrial Fibrillation      Neuro/Psych negative neurological ROS  negative psych ROS   GI/Hepatic ,GERD  Medicated and Controlled,,  Endo/Other    Morbid obesity (BMI 51)  Renal/GU Renal InsufficiencyRenal disease  negative genitourinary   Musculoskeletal  (+) Arthritis ,    Abdominal  (+) + obese  Peds  Hematology negative hematology ROS (+)   Anesthesia Other Findings Day of surgery medications reviewed with patient.  Reproductive/Obstetrics negative OB ROS                             Anesthesia Physical Anesthesia Plan  ASA: 3  Anesthesia Plan: MAC   Post-op Pain Management:    Induction:   PONV Risk Score and Plan: Treatment may vary due to age or medical condition, Propofol infusion and TIVA  Airway Management Planned: Natural Airway and Mask  Additional Equipment: None  Intra-op Plan:   Post-operative Plan:   Informed Consent: I have reviewed the patients History and Physical, chart, labs and discussed the procedure including the risks, benefits and alternatives for the proposed anesthesia with the patient or authorized representative who has indicated his/her understanding and acceptance.     Dental advisory  given  Plan Discussed with: CRNA  Anesthesia Plan Comments:         Anesthesia Quick Evaluation

## 2022-11-25 ENCOUNTER — Ambulatory Visit (HOSPITAL_COMMUNITY): Payer: BC Managed Care – PPO | Admitting: Certified Registered Nurse Anesthetist

## 2022-11-25 ENCOUNTER — Other Ambulatory Visit: Payer: Self-pay

## 2022-11-25 ENCOUNTER — Encounter (HOSPITAL_COMMUNITY)
Admission: RE | Disposition: A | Discharge status: Home / Self Care | Payer: Self-pay | Source: Ambulatory Visit | Attending: Gastroenterology

## 2022-11-25 ENCOUNTER — Encounter (HOSPITAL_COMMUNITY): Payer: Self-pay | Admitting: Gastroenterology

## 2022-11-25 ENCOUNTER — Ambulatory Visit (HOSPITAL_COMMUNITY)
Admission: RE | Admit: 2022-11-25 | Discharge: 2022-11-25 | Disposition: A | Discharge status: Home / Self Care | Payer: BC Managed Care – PPO | Source: Ambulatory Visit | Attending: Gastroenterology | Admitting: Gastroenterology

## 2022-11-25 ENCOUNTER — Other Ambulatory Visit: Payer: BC Managed Care – PPO

## 2022-11-25 DIAGNOSIS — K319 Disease of stomach and duodenum, unspecified: Secondary | ICD-10-CM | POA: Diagnosis not present

## 2022-11-25 DIAGNOSIS — Z6841 Body Mass Index (BMI) 40.0 and over, adult: Secondary | ICD-10-CM | POA: Diagnosis not present

## 2022-11-25 DIAGNOSIS — K31A15 Gastric intestinal metaplasia without dysplasia, involving multiple sites: Secondary | ICD-10-CM | POA: Diagnosis not present

## 2022-11-25 DIAGNOSIS — R748 Abnormal levels of other serum enzymes: Secondary | ICD-10-CM | POA: Diagnosis not present

## 2022-11-25 DIAGNOSIS — R1013 Epigastric pain: Secondary | ICD-10-CM | POA: Insufficient documentation

## 2022-11-25 DIAGNOSIS — I4891 Unspecified atrial fibrillation: Secondary | ICD-10-CM | POA: Insufficient documentation

## 2022-11-25 DIAGNOSIS — R112 Nausea with vomiting, unspecified: Secondary | ICD-10-CM | POA: Diagnosis not present

## 2022-11-25 DIAGNOSIS — Z79899 Other long term (current) drug therapy: Secondary | ICD-10-CM | POA: Insufficient documentation

## 2022-11-25 DIAGNOSIS — K760 Fatty (change of) liver, not elsewhere classified: Secondary | ICD-10-CM | POA: Insufficient documentation

## 2022-11-25 DIAGNOSIS — I130 Hypertensive heart and chronic kidney disease with heart failure and stage 1 through stage 4 chronic kidney disease, or unspecified chronic kidney disease: Secondary | ICD-10-CM | POA: Diagnosis not present

## 2022-11-25 DIAGNOSIS — K2950 Unspecified chronic gastritis without bleeding: Secondary | ICD-10-CM | POA: Diagnosis not present

## 2022-11-25 DIAGNOSIS — N182 Chronic kidney disease, stage 2 (mild): Secondary | ICD-10-CM | POA: Insufficient documentation

## 2022-11-25 DIAGNOSIS — Z7901 Long term (current) use of anticoagulants: Secondary | ICD-10-CM | POA: Diagnosis not present

## 2022-11-25 DIAGNOSIS — K3189 Other diseases of stomach and duodenum: Secondary | ICD-10-CM | POA: Diagnosis not present

## 2022-11-25 DIAGNOSIS — G4733 Obstructive sleep apnea (adult) (pediatric): Secondary | ICD-10-CM | POA: Diagnosis not present

## 2022-11-25 DIAGNOSIS — K219 Gastro-esophageal reflux disease without esophagitis: Secondary | ICD-10-CM | POA: Insufficient documentation

## 2022-11-25 DIAGNOSIS — R109 Unspecified abdominal pain: Secondary | ICD-10-CM

## 2022-11-25 DIAGNOSIS — R933 Abnormal findings on diagnostic imaging of other parts of digestive tract: Secondary | ICD-10-CM

## 2022-11-25 DIAGNOSIS — I509 Heart failure, unspecified: Secondary | ICD-10-CM | POA: Diagnosis not present

## 2022-11-25 DIAGNOSIS — I2510 Atherosclerotic heart disease of native coronary artery without angina pectoris: Secondary | ICD-10-CM | POA: Insufficient documentation

## 2022-11-25 DIAGNOSIS — K317 Polyp of stomach and duodenum: Secondary | ICD-10-CM | POA: Diagnosis not present

## 2022-11-25 DIAGNOSIS — I110 Hypertensive heart disease with heart failure: Secondary | ICD-10-CM | POA: Diagnosis not present

## 2022-11-25 DIAGNOSIS — Z951 Presence of aortocoronary bypass graft: Secondary | ICD-10-CM | POA: Diagnosis not present

## 2022-11-25 DIAGNOSIS — K7460 Unspecified cirrhosis of liver: Secondary | ICD-10-CM

## 2022-11-25 DIAGNOSIS — I5042 Chronic combined systolic (congestive) and diastolic (congestive) heart failure: Secondary | ICD-10-CM | POA: Insufficient documentation

## 2022-11-25 DIAGNOSIS — K31A11 Gastric intestinal metaplasia without dysplasia, involving the antrum: Secondary | ICD-10-CM | POA: Diagnosis not present

## 2022-11-25 DIAGNOSIS — I10 Essential (primary) hypertension: Secondary | ICD-10-CM

## 2022-11-25 DIAGNOSIS — K2970 Gastritis, unspecified, without bleeding: Secondary | ICD-10-CM

## 2022-11-25 DIAGNOSIS — R110 Nausea: Secondary | ICD-10-CM

## 2022-11-25 DIAGNOSIS — E7849 Other hyperlipidemia: Secondary | ICD-10-CM

## 2022-11-25 HISTORY — DX: Polyp of stomach and duodenum: K31.7

## 2022-11-25 HISTORY — PX: ESOPHAGOGASTRODUODENOSCOPY (EGD) WITH PROPOFOL: SHX5813

## 2022-11-25 HISTORY — DX: Gastritis, unspecified, without bleeding: K29.70

## 2022-11-25 HISTORY — PX: BIOPSY: SHX5522

## 2022-11-25 HISTORY — DX: Nausea with vomiting, unspecified: R11.2

## 2022-11-25 LAB — GLUCOSE, CAPILLARY: Glucose-Capillary: 90 mg/dL (ref 70–99)

## 2022-11-25 SURGERY — ESOPHAGOGASTRODUODENOSCOPY (EGD) WITH PROPOFOL
Anesthesia: Monitor Anesthesia Care

## 2022-11-25 MED ORDER — APIXABAN 5 MG PO TABS: 5.0000 mg | ORAL_TABLET | Freq: Two times a day (BID) | ORAL | 1 refills | Status: DC

## 2022-11-25 MED ORDER — PROPOFOL 1000 MG/100ML IV EMUL
INTRAVENOUS | Status: AC
  Filled 2022-11-25: qty 100

## 2022-11-25 MED ORDER — SODIUM CHLORIDE 0.9 % IV SOLN: INTRAVENOUS | Status: DC

## 2022-11-25 MED ORDER — LACTATED RINGERS IV SOLN
INTRAVENOUS | Status: AC | PRN
  Administered 2022-11-25: 1000 mL via INTRAVENOUS

## 2022-11-25 MED ORDER — PROPOFOL 500 MG/50ML IV EMUL
INTRAVENOUS | Status: DC | PRN
  Administered 2022-11-25: 130 ug/kg/min via INTRAVENOUS
  Administered 2022-11-25: 10 mg via INTRAVENOUS

## 2022-11-25 MED ORDER — PROPOFOL 500 MG/50ML IV EMUL
INTRAVENOUS | Status: AC
  Filled 2022-11-25: qty 50

## 2022-11-25 SURGICAL SUPPLY — 15 items

## 2022-11-25 NOTE — Discharge Instructions (Signed)
YOU HAD AN ENDOSCOPIC PROCEDURE TODAY: Refer to the procedure report and other information in the discharge instructions given to you for any specific questions about what was found during the examination. If this information does not answer your questions, please call [place] office at [phone] to clarify.  ° °YOU SHOULD EXPECT: Some feelings of bloating in the abdomen. Passage of more gas than usual. Walking can help get rid of the air that was put into your GI tract during the procedure and reduce the bloating. If you had a lower endoscopy (such as a colonoscopy or flexible sigmoidoscopy) you may notice spotting of blood in your stool or on the toilet paper. Some abdominal soreness may be present for a day or two, also. ° °DIET: Your first meal following the procedure should be a light meal and then it is ok to progress to your normal diet. A half-sandwich or bowl of soup is an example of a good first meal. Heavy or fried foods are harder to digest and may make you feel nauseous or bloated. Drink plenty of fluids but you should avoid alcoholic beverages for 24 hours. If you had a esophageal dilation, please see attached instructions for diet.   ° °ACTIVITY: Your care partner should take you home directly after the procedure. You should plan to take it easy, moving slowly for the rest of the day. You can resume normal activity the day after the procedure however YOU SHOULD NOT DRIVE, use power tools, machinery or perform tasks that involve climbing or major physical exertion for 24 hours (because of the sedation medicines used during the test).  ° °SYMPTOMS TO REPORT IMMEDIATELY: °A gastroenterologist can be reached at any hour. Please call [phone]  for any of the following symptoms:  °Following lower endoscopy (colonoscopy, flexible sigmoidoscopy) °Excessive amounts of blood in the stool  °Significant tenderness, worsening of abdominal pains  °Swelling of the abdomen that is new, acute  °Fever of 100° or  higher  °Following upper endoscopy (EGD, EUS, ERCP, esophageal dilation) °Vomiting of blood or coffee ground material  °New, significant abdominal pain  °New, significant chest pain or pain under the shoulder blades  °Painful or persistently difficult swallowing  °New shortness of breath  °Black, tarry-looking or red, bloody stools ° °FOLLOW UP:  °If any biopsies were taken you will be contacted by phone or by letter within the next 1-3 weeks. Call [phone]  if you have not heard about the biopsies in 3 weeks.  °Please also call with any specific questions about appointments or follow up tests. ° °

## 2022-11-25 NOTE — Transfer of Care (Signed)
Immediate Anesthesia Transfer of Care Note  Patient: Sean [name]  Procedure(s) Performed: ESOPHAGOGASTRODUODENOSCOPY (EGD) WITH PROPOFOL BIOPSY  Patient Location: Endoscopy Unit  Anesthesia Type:MAC  Level of Consciousness: awake, alert , and oriented  Airway & Oxygen Therapy: Patient Spontanous Breathing and Patient connected to face mask oxygen  Post-op Assessment: Report given to RN and Post -op Vital signs reviewed and stable  Post vital signs: Reviewed and stable  Last Vitals:  Vitals Value Taken Time  BP    Temp    Pulse 73 11/25/22 0815  Resp 20 11/25/22 0815  SpO2 100 % 11/25/22 0815  Vitals shown include unvalidated device data.  Last Pain:  Vitals:   11/25/22 0658  TempSrc: Tympanic  PainSc: 0-No pain         Complications: No notable events documented.

## 2022-11-25 NOTE — H&P (Signed)
Referring Provider: No ref. provider found Primary Care Physician:  Loralee Pacas, MD   Indication for EGD:  Abnormal CT, abdominal pain, gastric intestinal metaplasia   IMPRESSION:  Abnormal duodenal on recent CT scan at [facility]: EGD recommended.    Abdominal pain, nausea, and vomiting.  Previously attributed to gastric erosions/gastritis.  Persistent symptoms despite PPI to BID and Carafate twice daily.  Now with recent progression in symptoms. Labs show elevated lipase. But pancreas appears normal on imaging.    Gastric intestinal metaplasia:  Repeat EGD recommended for gastric mapping in 2025.  PLAN: EGD  HPI: Sean [name] is a 51 y.o. male presents for endoscopic evaluation of an abnormal CT scan.  He has been under evaluation for nausea, early morning vomiting, and epigastric pain.  He will occasionally have central, lower abdominal pain. Symptoms have been present for over 30 years. He stated waking up sick every morning for as long as he can remember, since childhood.  He awakens in the morning with nausea and vomits.  However, for the past 4 to 5 years he vomits up a small amount bright red blood with clear phlegm most mornings. He showed me a photo from his phone of blood and clear phlegm he recently vomited into the bathtub. Evaluation has included EGD and abdominal ultrasound.   Longstanding history of GERD treated the best with esomeprazole.  Did not find pantoprazole as effective. Frequently sleeps elevated in a recliner. He is taking Famotidine 57m QD. He is not taking Pantoprazole 44mbid as prescribed as it was not covered by his insurance, instead he is taking another PPI otc once or twice daly (he cannot recall the name of which PPI he is taking).    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    Endoscopic history: - EGD 01/2021 with Dr. BeTarri Glennhich  showed gastric intestinal metaplasia with a normal esophagus.  No evidence of esophageal or gastric varices.   - Colonoscopy 01/3021 which was incomplete, one tubular adenomatous polyp was removed from the colon - CT colonography 04/22/21 showed no polyp or mass. He did not tolerate the imaging study well and vowed to never do it again.    Elastography 01/15/21 showed an echogenic liver, Median kPa: 3.7; however, the study was limited in accuracy due to body habitus.    [facility] to evaluate the abdominal pain: Mild wall thickening of the proximal duodenum with periduodenal stranding which may represent duodenitis   Abdominal ultrasound 09/23/22 showed fatty liver   Labs 09/24/2022: Creatinine 2.66, glucose 146, sodium 142, albumin 3.2, total bilirubin 1.3, AST 33, ALT 30, alk phos 156, hemoglobin 11.4, MCV 100.1, RDW 16.8, platelets 62   Last INR 04/24/2022 was 1.2   Past Medical History:  Diagnosis Date   Abnormal liver enzymes    a. [place] in [place]  Cardiac cirrhosis    a. possible elevated LFTs/low platelets felt due to cardiac cirrhosis per 2017 admission.   Chronic combined systolic and diastolic CHF (congestive heart failure) (HCC)    CKD (chronic kidney disease), stage II    Stage 4   Congenital heart defect    a. rightward rotation of heart, almost dextrocardia   Coronary artery disease    a. s/p CABGx1 in 12/2015.   Hematuria    a. Chronic hx of this, no prior etiology determined through workup  per patient.   History of umbilical hernia repair 40/98/1191   History of hernia surgery twice prior   Hypercholesteremia    a. Prev taken off statin due to abnormal liver function.   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   LIMA to LAD   Sinus bradycardia    Thrombocytopenia ([place])      Past Surgical History:  Procedure Laterality Date   APPENDECTOMY     BIOPSY  02/19/2021   Procedure: BIOPSY;  Surgeon: Thornton Park, MD;  Location: [facility] ENDOSCOPY;  Service: Gastroenterology;;   CARDIAC CATHETERIZATION N/A 12/24/2015   Procedure: Right/Left Heart Cath and Coronary Angiography;  Surgeon: Jolaine Artist, MD;  Location: [place] CV LAB;  Service: Cardiovascular;  Laterality: N/A;   CARDIOVERSION N/A 02/14/2016   Procedure: CARDIOVERSION;  Surgeon: Pixie Casino, MD;  Location: [facility] ENDOSCOPY;  Service: Cardiovascular;  Laterality: N/A;   COLONOSCOPY WITH PROPOFOL N/A 02/19/2021   Procedure: COLONOSCOPY WITH PROPOFOL;  Surgeon: Thornton Park, MD;  Location: [facility] ENDOSCOPY;  Service: Gastroenterology;  Laterality: N/A;   CORONARY ARTERY BYPASS GRAFT N/A 12/26/2015   Procedure: Off Pump Coronary Artery Bypass Grafting times one using left internal mammary artery;  Surgeon: Rexene Alberts, MD;  Location: [place];  Service: Open Heart Surgery;  Laterality: N/A;   ESOPHAGOGASTRODUODENOSCOPY (EGD) WITH PROPOFOL N/A 02/19/2021   Procedure: ESOPHAGOGASTRODUODENOSCOPY (EGD) WITH PROPOFOL;  Surgeon: Thornton Park, MD;  Location: [facility] ENDOSCOPY;  Service: Gastroenterology;  Laterality: N/A;   GALLBLADDER SURGERY     HERNIA REPAIR     LEFT HEART CATH AND CORS/GRAFTS ANGIOGRAPHY N/A 04/15/2017   Procedure: Left Heart Cath and Cors/Grafts Angiography;  Surgeon: Troy Sine, MD;  Location: [place] CV LAB;  Service: Cardiovascular;  Laterality: N/A;   LEFT HEART CATH AND CORS/GRAFTS ANGIOGRAPHY N/A 11/02/2020   Procedure: LEFT HEART CATH AND CORS/GRAFTS ANGIOGRAPHY;  Surgeon: Sherren Mocha, MD;  Location: [place] CV LAB;  Service: Cardiovascular;  Laterality: N/A;   POLYPECTOMY  02/19/2021   Procedure: POLYPECTOMY;  Surgeon: Thornton Park, MD;  Location: [facility] ENDOS[facility];  Service: Gastroenterology;;   TEE WITHOUT CARDIOVERSION N/A 12/26/2015   Procedure: TRANSESOPHAGEAL  ECHOCARDIOGRAM (TEE);  Surgeon: Rexene Alberts, MD;  Location: [place];  Service: Open Heart Surgery;  Laterality: N/A;   TEE WITHOUT CARDIOVERSION N/A 02/14/2016   Procedure: TRANSESOPHAGEAL ECHOCARDIOGRAM (TEE);  Surgeon: Pixie Casino, MD;  Location[facility] ENDOSCOPY;  Service: Cardiovascular;  Laterality: N/A;    Current Facility-Administered Medications  Medication Dose Route Frequency Provider Last Rate Last Admin   0.9 %  sodium chloride infusion   Intravenous Continuous Thornton Park, MD       lactated ringers infusion    Continuous PRN Thornton Park, MD 10 mL/hr at 11/25/22 4782 Continued from Pre-op at 11/25/22 0714    Allergies as of 09/30/2022 - Review Complete 09/30/2022  Allergen Reaction Noted   Doxycycline Shortness Of Breath 11/15/2021   Imdur [isosorbide dinitrate] Diarrhea 04/16/2017   Augmentin [amoxicillin-pot clavulanate] Itching 04/07/2016   Tape Rash 04/14/2017    Family History  Problem Relation Age of Onset   CAD Father        2 stents ~ 54   Diabetes Father    Heart attack Other    Hyperlipidemia Other    Diabetes  Maternal Grandfather    Diabetes Maternal Uncle      Physical Exam: General:   Alert,  well-nourished, pleasant and cooperative in NAD Head:  Normocephalic and atraumatic. Eyes:  Sclera clear, no icterus.   Conjunctiva pink. Mouth:  No deformity or lesions.   Neck:  Supple; no masses or thyromegaly. Lungs:  Clear throughout to auscultation.   No wheezes. Heart:  Regular rate and rhythm; no murmurs. Abdomen:  Soft, central obesity, non-tender, nondistended, normal bowel sounds, no rebound or guarding.  Msk:  Symmetrical. No boney deformities LAD: No inguinal or umbilical LAD Extremities:  No clubbing or edema. Neurologic:  Alert and  oriented x4;  grossly nonfocal Skin:  No obvious rash or bruise. Psych:  Alert and cooperative. Normal mood and affect.     Studies/Results: No results found.    Dajanay Northrup L. Tarri Glenn, MD,  MPH 11/25/2022, 7:37 AM

## 2022-11-25 NOTE — Anesthesia Postprocedure Evaluation (Signed)
Anesthesia Post Note  Patient: Sekou [name]  Procedure(s) Performed: ESOPHAGOGASTRODUODENOSCOPY (EGD) WITH PROPOFOL BIOPSY     Patient location during evaluation: Endoscopy Anesthesia Type: MAC Level of consciousness: awake Pain management: pain level controlled Vital Signs Assessment: post-procedure vital signs reviewed and stable Respiratory status: spontaneous breathing Cardiovascular status: stable Postop Assessment: no apparent nausea or vomiting Anesthetic complications: no   No notable events documented.  Last Vitals:  Vitals:   11/25/22 0825 11/25/22 0835  BP: (!) 170/72 (!) 166/82  Pulse: 66 61  Resp: 19 20  Temp:    SpO2: 100% 100%    Last Pain:  Vitals:   11/25/22 0835  TempSrc:   PainSc: 0-No pain                 Huston Foley

## 2022-11-25 NOTE — Op Note (Signed)
[facility] Patient Name: Sean [name] Procedure Date: 11/25/2022 MRN: [id number] Attending MD: Thornton Park MD, MD, [id number] Date of Birth: 1971/03/25 CSN: [id number] Age: 51 Admit Type: Outpatient Procedure:                Upper GI endoscopy Indications:              Abdominal pain, Abnormal CT of the GI tract,                            Gastric intestinal metaplasia without dysplasia,                            Nausea with vomiting Providers:                Thornton Park MD, MD, Doristine Johns, RN,                            Luan Moore, Technician, Bella Kennedy A. Armistead,                            CRNA Referring MD:              Medicines:                Monitored Anesthesia Care Complications:            No immediate complications. Estimated Blood Loss:     Estimated blood loss was minimal. Procedure:                Pre-Anesthesia Assessment:                           - Prior to the procedure, a History and Physical                            was performed, and patient medications and                            allergies were reviewed. The patient's tolerance of                            previous anesthesia was also reviewed. The risks                            and benefits of the procedure and the sedation                            options and risks were discussed with the patient.                            All questions were answered, and informed consent                            was obtained. Prior Anticoagulants: The patient has                            taken Eliquis (  apixaban), last dose was 2 days                            prior to procedure. ASA Grade Assessment: III - A                            patient with severe systemic disease. After                            reviewing the risks and benefits, the patient was                            deemed in satisfactory condition to undergo the                            procedure.                            After obtaining informed consent, the endoscope was                            passed under direct vision. Throughout the                            procedure, the patient's blood pressure, pulse, and                            oxygen saturations were monitored continuously. The                            GIF-H190 ([id number]) Olympus endoscope was introduced                            through the mouth, and advanced to the third part                            of duodenum. The upper GI endoscopy was                            accomplished without difficulty. The patient                            tolerated the procedure well. Scope In: Scope Out: Findings:      The esophagus was normal. No esophageal varices are present.      Three small sessile polyps with no bleeding and stigmata of recent       bleeding were found in the gastric body. Biopsies were taken pf each       polyp with a cold forceps for histology. Estimated blood loss was       minimal.      Diffuse moderate mucosal changes characterized by discoloration,       erythema, altered texture and a decreased vascular pattern were found in       the entire examined stomach. The distal stomach is erythematous, while  the more proximal stomach has a decreased vascular pattern. No portal       hypertensive gastropathy or gastric varices were present. Biopsies were       taken from the antrum, body, and fundus with a cold forceps for       histology with the history of intestinal metaplasia. Estimated blood       loss was minimal.      Patchy mildly erythematous mucosa without active bleeding and with no       stigmata of bleeding was found in the duodenal bulb. Biopsies were taken       with a cold forceps for histology. Estimated blood loss was minimal.      The cardia and gastric fundus were normal on retroflexion. Impression:               - Normal esophagus.                           - Three gastric  polyps. Some spontaneous oozing.                            Biopsied.                           - Abnormal gastric mucosa with both erythema and                            loss of vascular pattern. Biopsied.                           - No evidence for portal hypertensive gastropathy,                            esophageal varices, or gastric varices.                           - Erythematous duodenopathy. Biopsied. Moderate Sedation:      Not Applicable - Patient had care per Anesthesia. Recommendation:           - Patient has a contact number available for                            emergencies. The signs and symptoms of potential                            delayed complications were discussed with the                            patient. Return to normal activities tomorrow.                            Written discharge instructions were provided to the                            patient.                           - Resume previous diet.                           -  Continue present medications.                           - May resume Eliquis 11/27/22.                           - Await pathology results.                           - No aspirin, ibuprofen, naproxen, or other                            non-steroidal anti-inflammatory drugs.                           - Office follow-up to review these results. Procedure Code(s):        --- Professional ---                           [phone], Esophagogastroduodenoscopy, flexible,                            transoral; with biopsy, single or multiple Diagnosis Code(s):        --- Professional ---                           K31.7, Polyp of stomach and duodenum                           K31.89, Other diseases of stomach and duodenum                           R10.9, Unspecified abdominal pain                           K31.A19, Gastric intestinal metaplasia without                            dysplasia, unspecified site                           R11.2,  Nausea with vomiting, unspecified                           R93.3, Abnormal findings on diagnostic imaging of                            other parts of digestive tract CPT copyright 2022 [employer] Association. All rights reserved. The codes documented in this report are preliminary and upon coder review may  be revised to meet current compliance requirements. Thornton Park MD, MD 11/25/2022 8:29:45 AM This report has been signed electronically. Number of Addenda: 0

## 2022-11-26 ENCOUNTER — Other Ambulatory Visit: Payer: BC Managed Care – PPO

## 2022-11-26 ENCOUNTER — Other Ambulatory Visit: Payer: Self-pay | Admitting: Gastroenterology

## 2022-11-26 DIAGNOSIS — R1013 Epigastric pain: Secondary | ICD-10-CM

## 2022-11-27 ENCOUNTER — Encounter (HOSPITAL_COMMUNITY): Payer: Self-pay | Admitting: Gastroenterology

## 2022-11-28 LAB — SURGICAL PATHOLOGY

## 2022-11-30 LAB — ALKALINE PHOSPHATASE, ISOENZYMES
Alkaline Phosphatase: 205 IU/L — ABNORMAL HIGH (ref 44–121)
BONE FRACTION: 35 % (ref 12–68)
INTESTINAL FRAC.: 6 % (ref 0–18)
LIVER FRACTION: 59 % (ref 13–88)

## 2022-12-01 NOTE — Progress Notes (Signed)
Looking at the bone and liver fraction doesn't tell me where the elevated alk phos is coming from.  This leads me to believe that it is a side effect(s) of one of your medications.  Its been going on a while and the levels aren't that extreme, but we should go over all your medications at the next visit.  I can also refer you to any specialist you would like for a second opinion (for example a gastrointestinal specialist or a bone and oncology specialist)

## 2022-12-08 ENCOUNTER — Encounter: Payer: BC Managed Care – PPO | Attending: Internal Medicine | Admitting: Skilled Nursing Facility1

## 2022-12-08 ENCOUNTER — Encounter: Payer: Self-pay | Admitting: Skilled Nursing Facility1

## 2022-12-08 ENCOUNTER — Ambulatory Visit: Payer: BC Managed Care – PPO | Admitting: Skilled Nursing Facility1

## 2022-12-08 DIAGNOSIS — N189 Chronic kidney disease, unspecified: Secondary | ICD-10-CM | POA: Insufficient documentation

## 2022-12-08 NOTE — Telephone Encounter (Signed)
Spoke with a representative and informed him that the patient was denied for the [facility].

## 2022-12-08 NOTE — Progress Notes (Signed)
Medical Nutrition Therapy  Appointment Start time:  ***  Appointment End time:  ***  Primary concerns today: ***  Referral diagnosis: *** Preferred learning style: *** (auditory, visual, hands on, no preference indicated) Learning readiness: *** (not ready, contemplating, ready, change in progress)   NUTRITION ASSESSMENT   Clinical Medical Hx: hyperlipemia, HTN. 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 Pt states his schedule is all over the place.  Pt states he lives with his parents. Pt states he is trying to eat a piece of fruit in between breakfast and lunch. Pt states his mom will cook for him and also does the grocery shopping. Pt states his mom keeps children 3 days a week   Estimated daily fluid intake: *** oz Supplements: B 12  Sleep: 6-8 hours with rest wearing a  C-PAP Stress / self-care: low levels of stress  Current average weekly physical activity: ***  24-Hr Dietary Recall First Meal 8am: pack of crackers or oreos or banana Snack: orange  Second Meal 12-1pm: chicken salad sandwich or [place] sandwich Snack: orange or cookies Third Meal 8:30-9pm: fast food Snack:  Beverages: water, 1 20 ounce soda  Estimated Energy Needs Calories: *** Carbohydrate: ***g Protein: ***g Fat: ***g   NUTRITION DIAGNOSIS  {CHL AMB NUTRITIONAL DIAGNOSIS:[phone]}   NUTRITION INTERVENTION  Nutrition education (E-1) on the following topics:  ***  Handouts Provided Include  ***  Learning Style & Readiness for Change Teaching method utilized: Visual & Auditory  Demonstrated degree of understanding via: Teach Back  Barriers to learning/adherence to lifestyle change: ***  Goals Established by Pt 2 bottles of water before lunch and then 2 bottles of water after lunch    MONITORING &  EVALUATION Dietary intake, weekly physical activity, and *** in ***.  Next Steps  Patient is to ***.

## 2022-12-09 ENCOUNTER — Encounter: Payer: Self-pay | Admitting: Skilled Nursing Facility1

## 2022-12-11 ENCOUNTER — Other Ambulatory Visit: Payer: Self-pay | Admitting: Physician Assistant

## 2022-12-11 DIAGNOSIS — R1013 Epigastric pain: Secondary | ICD-10-CM

## 2022-12-15 ENCOUNTER — Ambulatory Visit: Payer: BC Managed Care – PPO | Admitting: Internal Medicine

## 2022-12-15 ENCOUNTER — Encounter: Payer: Self-pay | Admitting: Internal Medicine

## 2022-12-15 ENCOUNTER — Encounter (INDEPENDENT_AMBULATORY_CARE_PROVIDER_SITE_OTHER): Payer: Self-pay

## 2022-12-15 ENCOUNTER — Ambulatory Visit (INDEPENDENT_AMBULATORY_CARE_PROVIDER_SITE_OTHER): Payer: BC Managed Care – PPO | Admitting: Internal Medicine

## 2022-12-15 DIAGNOSIS — E039 Hypothyroidism, unspecified: Secondary | ICD-10-CM | POA: Diagnosis not present

## 2022-12-15 DIAGNOSIS — Z951 Presence of aortocoronary bypass graft: Secondary | ICD-10-CM | POA: Diagnosis not present

## 2022-12-15 DIAGNOSIS — R748 Abnormal levels of other serum enzymes: Secondary | ICD-10-CM | POA: Diagnosis not present

## 2022-12-15 DIAGNOSIS — N184 Chronic kidney disease, stage 4 (severe): Secondary | ICD-10-CM

## 2022-12-15 MED ORDER — ZEPBOUND 2.5 MG/0.5ML ~~LOC~~ SOAJ: 2.5000 mg | SUBCUTANEOUS | 3 refills | Status: DC

## 2022-12-15 NOTE — Assessment & Plan Note (Signed)
Encouraged continuing with monthly medically managed weight loss - referred to official location and agreed to do the best I can while waiting for specialist support - set up monthly follow up with me for now Encouraged continuing with medical nutrition therapy  Encouraged continuing with diet and exercise efforts Educated that he needs to lose weight but not too rapidly Offered metformin- he declined as he is already on a lot of medications I did try sending in zepbound again this month just because its such an incredible weight loss medication I want him to have it

## 2022-12-15 NOTE — Progress Notes (Signed)
[name]: [phone]   Routine Medical Office Visit  Patient:  Sean [name]      Age: 52 y.o.       Sex:  male  Date:   12/15/2022  PCP:    Loralee Pacas, [place] Provider: Loralee Pacas, MD   Problem Focused Charting:   Medical Decision Making per Assessment/Plan   Reiner was seen today for 1 month follow-up.  Morbid obesity ([place]) Overview: Wt Readings from Last 10 Encounters:  12/15/22 (!) 373 lb (169.2 kg)  12/08/22 (!) 388 lb 12.8 oz (176.4 kg)  11/25/22 (!) 384 lb 11.2 oz (174.5 kg)  11/13/22 (!) 384 lb 12.8 oz (174.5 kg)  10/13/22 (!) 385 lb (174.6 kg)  09/30/22 (!) 384 lb (174.2 kg)  09/24/22 (!) 382 lb (173.3 kg)  09/03/22 (!) 377 lb 3.2 oz (171.1 kg)  07/24/22 (!) 386 lb 3.2 oz (175.2 kg)  06/25/22 (!) 392 lb 4 oz (177.9 kg)   BMI Readings from Last 1 Encounters:  12/15/22 52.02 kg/m   No history bariatrics Complicated by  elevated alk phos status post cholecystecomy  Complicated by umbilical hernia Complicated by gout Complicated by coronary artery disease status post cabg Complicated by gastritis Complicated by chronic kidney disease 3-4 Complicated by obstructive sleep apnea  Complicated by aflutter  Assessment & Plan: Encouraged continuing with monthly medically managed weight loss - referred to official location and agreed to do the best I can while waiting for specialist support - set up monthly follow up with me for now Encouraged continuing with medical nutrition therapy  Encouraged continuing with diet and exercise efforts Educated that he needs to lose weight but not too rapidly Offered metformin- he declined as he is already on a lot of medications I did try sending in zepbound again this month just because its such an incredible weight loss medication I want him to have it     Orders: -     Amb Ref to Medical Weight Management -     Zepbound; Inject 2.5 mg into the skin once a week.   Dispense: 0.5 mL; Refill: 3  Alkaline phosphatase elevation Overview: Lab Results  Component Value Date   ALKPHOS 205 (H) 11/25/2022   Fractionated alk phos was normal   Lab Results  Component Value Date   ALT 29 09/30/2022  S/p cholecystectomy   Assessment & Plan: Reviewed recent fractionated data Suspect all fatty liver related Plan ggt for next lab draw, considering ultrasound abdomen Encouraged continued weight loss    S/P Off-pump CABG x 1  History of coronary artery bypass surgery Overview: LIMA to LAD   Hypothyroidism, unspecified type Overview: Lab Results  Component Value Date   TSH 2.96 09/03/2022   On 50 mcg levothyroxine    Chronic kidney disease, stage 4 (severe) (HCC) Overview: Lab Results  Component Value Date/Time   GFR 29.24 (L) 09/30/2022 02:51 PM   GFR 27.27 (L) 09/24/2022 03:40 PM   GFR 24.10 (L) 09/03/2022 03:45 PM   GFR 18.98 (L) 08/25/2022 12:21 PM   GFR 38.98 (L) 04/24/2022 02:39 PM     Assessment & Plan: Will wait til next month follow up to repeat Encouraged drink plenty of fluids-advised this is very important when losing weight        Subjective - Clinical Presentation:   Sean [name] is a 52 y.o. male  Patient Active Problem List   Diagnosis Date Noted   Abnormal  CT scan, gastrointestinal tract 11/25/2022   Gastric polyps 11/25/2022   Gastritis and gastroduodenitis 11/25/2022   Abdominal pain 11/25/2022   Nausea and vomiting 11/25/2022   Chronic venous stasis dermatitis of both lower extremities 10/13/2022   Chronic venous stasis 10/13/2022   Statins contraindicated 11/01/2021   Hypothyroid 11/01/2021   Herpes simplex 04/04/2021   Ectatic aorta (HCC) 06/15/2020   Gastroesophageal reflux disease without esophagitis 09/05/2019   Alkaline phosphatase elevation 01/23/2017   NASH (nonalcoholic steatohepatitis) 01/23/2017   Chronic gout of multiple sites 01/20/2017   Prediabetes 01/20/2017   Morbid obesity  ([place])    Other long term (current) drug therapy 02/19/2016   Obstructive sleep apnea syndrome 02/19/2016   Atrial flutter with rapid ventricular response ([place]) 02/12/2016   Chronic kidney disease, stage 4 (severe) ([place]) 01/04/2016   History of coronary artery bypass surgery 12/26/2015   CAD (coronary artery disease)    Essential hypertension 12/23/2015   Thrombocytopenia, idiopathic ([place]) 12/23/2015   Sinus bradycardia 12/23/2015   History of umbilical hernia repair 43/32/9518   Past Medical History:  Diagnosis Date   Abnormal liver enzymes    a. Sees a doctor in [place].   Cardiac cirrhosis    a. possible elevated LFTs/low platelets felt due to cardiac cirrhosis per 2017 admission.   Chronic combined systolic and diastolic CHF (congestive heart failure) (HCC)    CKD (chronic kidney disease), stage II    Stage 4   Congenital heart defect    a. rightward rotation of heart, almost dextrocardia   Coronary artery disease    a. s/p CABGx1 in 12/2015.   Hematuria    a. Chronic hx of this, no prior etiology determined through workup per patient.   History of umbilical hernia repair 84/16/6063   History of hernia surgery twice prior   Hypercholesteremia    a. Prev taken off statin due to abnormal liver function.   Hypertension    Incisional hernia 07/10/2011   History of hernia surgery twice prior   Morbid obesity ([place])    Morbid obesity with BMI of 50.0-59.9, adult ([place]) 01/04/2016   After [place] [facility] doctor is setting him up with a wellness clinic to help with weight losS  He reports not having tried anything for weight loss either diet or or medicine or surgery in the past  We failed to get [facility]      Wt Readings from Last 10 Encounters:  11/13/22  (!) 384 lb 12.8 oz (174.5 kg)  10/13/22  (!) 385 lb (174.6 kg)  09/30/22  (!) 384 lb (174.2 kg)  09/24/22  (!) 382 lb (1   Nausea and vomiting 11/25/2022   OSA on CPAP    Paroxysmal atrial flutter ([place]) 02/12/2016   S/P Off-pump  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   LIMA to LAD   Sinus bradycardia    Thrombocytopenia (HCC)     Outpatient Medications Prior to Visit  Medication Sig   acetaminophen (TYLENOL) 500 MG tablet Take 500-1,500 mg by mouth as needed for moderate pain, fever, headache or mild pain.   allopurinol (ZYLOPRIM) 100 MG tablet TAKE 1 TABLET(100 MG) BY MOUTH DAILY   amitriptyline (ELAVIL) 25 MG tablet Take 1 tablet (25 mg total) by mouth at bedtime.   apixaban (ELIQUIS) 5 MG TABS tablet Take 1 tablet (5 mg total) by mouth 2 (two) times daily.   azelastine (ASTELIN) 0.1 % nasal spray Place 2 sprays into both nostrils 2 (two) times daily. (Patient taking differently: Place 2  sprays into both nostrils 2 (two) times daily as needed for allergies.)   Cyanocobalamin (VITAMIN B-12) 5000 MCG SUBL Place 5,000 mcg under the tongue daily.   diltiazem (CARDIZEM SR) 120 MG 12 hr capsule TAKE 1 CAPSULE(120 MG) BY MOUTH TWICE DAILY   docusate sodium (STOOL SOFTENER) 100 MG capsule Take 1 capsule (100 mg total) by mouth daily. (Patient taking differently: Take 100 mg by mouth daily as needed for moderate constipation.)   empagliflozin (JARDIANCE) 10 MG TABS tablet Take 1 tablet (10 mg total) by mouth daily before breakfast.   Evolocumab (REPATHA SURECLICK) 696 MG/ML SOAJ INJECT THE CONTENTS OF 1 SYRINGE UNDER THE SKIN EVERY 14 DAYS   famotidine (PEPCID) 20 MG tablet TAKE 1 TABLET(20 MG) BY MOUTH TWICE DAILY   levothyroxine (SYNTHROID) 50 MCG tablet TAKE 1 TABLET(50 MCG) BY MOUTH DAILY BEFORE BREAKFAST   losartan (COZAAR) 25 MG tablet Take 1 tablet (25 mg total) by mouth daily.   metoprolol succinate (TOPROL-XL) 25 MG 24 hr tablet TAKE 1 TABLET(25 MG) BY MOUTH DAILY   montelukast (SINGULAIR) 10 MG tablet TAKE 1 TABLET(10 MG) BY MOUTH DAILY   nitroGLYCERIN (NITROSTAT) 0.4 MG SL tablet Place 1 tablet (0.4 mg total) under the tongue every 5 (five) minutes as needed for chest pain.   pantoprazole (PROTONIX) 40 MG tablet TAKE 1 TABLET(40 MG)  BY MOUTH TWICE DAILY   polyethylene glycol powder (GLYCOLAX/MIRALAX) 17 GM/SCOOP powder Take 1 dose daily. May increase to BID if needed (Patient taking differently: Take 17 g by mouth daily as needed for moderate constipation.)   potassium chloride (KLOR-CON M) 10 MEQ tablet TAKE 1 TABLET(10 MEQ) BY MOUTH DAILY   sucralfate (CARAFATE) 1 g tablet TAKE 1 TABLET(1 GRAM) BY MOUTH TWICE DAILY. MAY INCREASE TO 4 TIMES DAILY FOR ANY ACUTE SYMPTOMS   valACYclovir (VALTREX) 500 MG tablet Take 500 mg by mouth 2 (two) times daily.   valACYclovir (VALTREX) 500 MG tablet Take 1 tablet (500 mg total) by mouth daily.   [DISCONTINUED] Semaglutide-Weight Management 1 MG/0.5ML SOAJ Inject 1 mg into the skin once a week for 28 days.   [DISCONTINUED] Semaglutide-Weight Management 1.7 MG/0.75ML SOAJ Inject 1.7 mg into the skin once a week for 28 days.   [DISCONTINUED] Semaglutide-Weight Management 2.4 MG/0.75ML SOAJ Inject 2.4 mg into the skin once a week for 28 days.   [DISCONTINUED] Tirzepatide-Weight Management (ZEPBOUND) 2.5 MG/0.5ML SOAJ Inject 2.5 mg into the skin once a week.   No facility-administered medications prior to visit.    Chief Complaint  Patient presents with   1 month follow-up    HPI  We discussed the recent labs, recent efforts at weight loss and visit to medical nutrition therapy , and managing kidney disease with diet.(Lots of fluid)         Objective:  Physical Exam  BP 121/64 (BP Location: Right Arm, Patient Position: Sitting)   Pulse 60   Temp 98.7 F (37.1 C) (Temporal)   Ht '5\' 11"'$  (1.803 m)   Wt (!) 373 lb (169.2 kg)   SpO2 97%   BMI 52.02 kg/m  Severely obese  by BMI criteria but truncal adiposity (waist circumference or caliper) should be used instead. Wt Readings from Last 10 Encounters:  12/15/22 (!) 373 lb (169.2 kg)  12/08/22 (!) 388 lb 12.8 oz (176.4 kg)  11/25/22 (!) 384 lb 11.2 oz (174.5 kg)  11/13/22 (!) 384 lb 12.8 oz (174.5 kg)  10/13/22 (!) 385 lb (174.6  kg)  09/30/22 (!) 384  lb (174.2 kg)  09/24/22 (!) 382 lb (173.3 kg)  09/03/22 (!) 377 lb 3.2 oz (171.1 kg)  07/24/22 (!) 386 lb 3.2 oz (175.2 kg)  06/25/22 (!) 392 lb 4 oz (177.9 kg)   Vital signs reviewed.  Nursing notes reviewed. Weight trend reviewed. General Appearance:  Well developed, well nourished male in no acute distress.   Normal work of breathing at rest Musculoskeletal: All extremities are intact.  Neurological:  Awake, alert,  No obvious focal neurological deficits or cognitive impairments Psychiatric:  Appropriate mood, pleasant demeanor Problem-specific findings:  obesity   Results Reviewed: No results found for any visits on 12/15/22.  Recent Results (from the past 2160 hour(s))  CBC with Differential/Platelet     Status: Abnormal   Collection Time: 09/24/22  3:40 PM  Result Value Ref Range   WBC 5.9 4.0 - 10.5 K/uL   RBC 3.33 (L) 4.22 - 5.81 Mil/uL   Hemoglobin 11.4 (L) 13.0 - 17.0 g/dL   HCT 33.3 (L) 39.0 - 52.0 %   MCV 100.1 (H) 78.0 - 100.0 fl   MCHC 34.1 30.0 - 36.0 g/dL   RDW 16.8 (H) 11.5 - 15.5 %   Platelets 62.0 (L) 150.0 - 400.0 K/uL   Neutrophils Relative % 56.5 43.0 - 77.0 %   Lymphocytes Relative 27.8 12.0 - 46.0 %   Monocytes Relative 10.8 3.0 - 12.0 %   Eosinophils Relative 4.6 0.0 - 5.0 %   Basophils Relative 0.3 0.0 - 3.0 %   Neutro Abs 3.3 1.4 - 7.7 K/uL   Lymphs Abs 1.6 0.7 - 4.0 K/uL   Monocytes Absolute 0.6 0.1 - 1.0 K/uL   Eosinophils Absolute 0.3 0.0 - 0.7 K/uL   Basophils Absolute 0.0 0.0 - 0.1 K/uL  Comp Met (CMET)     Status: Abnormal   Collection Time: 09/24/22  3:40 PM  Result Value Ref Range   Sodium 142 135 - 145 mEq/L   Potassium 4.3 3.5 - 5.1 mEq/L   Chloride 109 96 - 112 mEq/L   CO2 26 19 - 32 mEq/L   Glucose, Bld 146 (H) 70 - 99 mg/dL   BUN 24 (H) 6 - 23 mg/dL   Creatinine, Ser 2.66 (H) 0.40 - 1.50 mg/dL   Total Bilirubin 1.3 (H) 0.2 - 1.2 mg/dL   Alkaline Phosphatase 156 (H) 39 - 117 U/L   AST 33 0 - 37 U/L   ALT  30 0 - 53 U/L   Total Protein 6.2 6.0 - 8.3 g/dL   Albumin 3.2 (L) 3.5 - 5.2 g/dL   GFR 27.27 (L) >60.00 mL/min    Comment: Calculated using the CKD-EPI Creatinine Equation (2021)   Calcium 8.7 8.4 - 10.5 mg/dL  Comp Met (CMET)     Status: Abnormal   Collection Time: 09/30/22  2:51 PM  Result Value Ref Range   Sodium 142 135 - 145 mEq/L   Potassium 4.1 3.5 - 5.1 mEq/L   Chloride 109 96 - 112 mEq/L   CO2 27 19 - 32 mEq/L   Glucose, Bld 92 70 - 99 mg/dL   BUN 27 (H) 6 - 23 mg/dL   Creatinine, Ser 2.51 (H) 0.40 - 1.50 mg/dL   Total Bilirubin 1.2 0.2 - 1.2 mg/dL   Alkaline Phosphatase 161 (H) 39 - 117 U/L   AST 32 0 - 37 U/L   ALT 29 0 - 53 U/L   Total Protein 6.8 6.0 - 8.3 g/dL   Albumin 3.5 3.5 -  5.2 g/dL   GFR 29.24 (L) >60.00 mL/min    Comment: Calculated using the CKD-EPI Creatinine Equation (2021)   Calcium 9.6 8.4 - 10.5 mg/dL  Hemoglobin A1c     Status: None   Collection Time: 09/30/22  2:51 PM  Result Value Ref Range   Hgb A1c MFr Bld 5.2 4.6 - 6.5 %    Comment: Glycemic Control Guidelines for People with Diabetes:Non Diabetic:  <6%Goal of Therapy: <7%Additional Action Suggested:  >8%   Alkaline phosphatase, isoenzymes     Status: Abnormal   Collection Time: 11/25/22 12:00 AM  Result Value Ref Range   Alkaline Phosphatase 205 (H) 44 - 121 IU/L   LIVER FRACTION 59 13 - 88 %   BONE FRACTION 35 12 - 68 %   INTESTINAL FRAC. 6 0 - 18 %  Glucose, capillary     Status: None   Collection Time: 11/25/22  7:04 AM  Result Value Ref Range   Glucose-Capillary 90 70 - 99 mg/dL    Comment: Glucose reference range applies only to samples taken after fasting for at least 8 hours.  Surgical pathology     Status: None   Collection Time: 11/25/22  7:59 AM  Result Value Ref Range   SURGICAL PATHOLOGY      SURGICAL PATHOLOGY CASE: [id number] PATIENT: Grover Canavan Surgical Pathology Report     Clinical History: Abd pain, abnormal CT of abdomen, nausea, vomiting, cirrhosis  (c[facility])     FINAL MICROSCOPIC DIAGNOSIS:  A. DUODENUM, BIOPSY: - Duodenal mucosa with prominent mucosal lymphocytic aggregates.  No intraepithelial lymphocytes, villous atrophy or malignancy.  No granulomas identified. - Helicobacter pylori immunostaining negative.  B. STOMACH, POLYPECTOMY: - Hyperplastic polyp.  No intestinal metaplasia, dysplasia or malignancy.  C. STOMACH, ANTRUM, LESSER CURVE, BIOPSY: - Reactive gastropathy. - Intestinal metaplasia.  No dysplasia or malignancy.  D. STOMACH, ANTRUM, GREATER CURVE, BIOPSY: - Reactive gastropathy.  No intestinal metaplasia, dysplasia or malignancy. - Focal chronic gastritis.  E. STOMACH, BODY, BIOPSY: - Gastric mucosa with patchy chronic gastritis.  Helicobacter pylori immunostaining negative. - No intestinal metaplasia, dysplasi a or malignancy.  F. STOMACH, INCISURA, BIOPSY: - Focal chronic gastritis.  Helicobacter pylori immunostaining negative. - No intestinal metaplasia, dysplasia or malignancy.  G. STOMACH, FUNDUS, BIOPSY: - Patchy chronic gastritis.  Helicobacter pylori immunostaining negative. - No intestinal metaplasia, dysplasia or malignancy.  GROSS DESCRIPTION:  A: Received in formalin are tan, soft tissue fragments that are submitted in toto. Number: Multiple size: Range from 0.3 to 0.4 cm blocks: 1  B: Received in formalin are tan, soft tissue fragments that are submitted in toto. Number: 4 size: Range from 0.1 to 0.5 cm blocks: 1  C: Received in formalin are tan, soft tissue fragments that are submitted in toto. Number: 3 size: Range from 0.1 to 0.5 cm blocks: 1  D: Received in formalin are tan, soft tissue fragments that are submitted in toto. Number: 2 size: 0.3 and 0.5 cm blocks: 1  E: Received in formalin are tan, soft tissue fragments that are submitted in toto. Nu mber: 2 size: 0.4 and 0.5 cm blocks: 1  F: Received in formalin are tan, soft tissue fragments that are submitted in toto.  Number: 3 size: Range from 0.2 to 0.3 cm blocks: 1  G: Received in formalin are tan, soft tissue fragments that are submitted in toto. Number: 3 size: Range from 0.1 to 0.3 cm blocks: 1 Craig Staggers 11/25/2022)   Final Diagnosis performed by Mark Martinique, MD.  Electronically signed 11/28/2022 Technical component performed at [facility], [place] [address], [place], [place] [address].  Professional component performed at [employer]. [facility], [place] [address], [place], [place] [address].  Immunohistochemistry Technical component (if applicable) was performed at [facility]. [address] [place], [place], [place] [address].   IMMUNOHISTOCHEMISTRY DISCLAIMER (if applicable): Some of these immunohistochemical stains may have been developed and the performance characteristics determine by [facility].  Some may not have been cleared or approved by the U.S. Food and Drug Administration. The FDA has determined that such clearance or approval is not necessary. This test is used for clinical purposes. It should not be regarded as investigational or for research. This laboratory is certified under the [place] (CLIA-88) as qualified to perform high complexity clinical laboratory testing.  The controls stained appropriately.           Signed: Loralee Pacas, MD 12/15/2022 12:51 PM

## 2022-12-15 NOTE — Assessment & Plan Note (Addendum)
Reviewed recent fractionated data Suspect all fatty liver related Plan ggt for next lab draw, considering ultrasound abdomen Encouraged continued weight loss

## 2022-12-15 NOTE — Assessment & Plan Note (Signed)
Will wait til next month follow up to repeat Encouraged drink plenty of fluids-advised this is very important when losing weight

## 2022-12-17 ENCOUNTER — Other Ambulatory Visit: Payer: Self-pay | Admitting: Pharmacist

## 2022-12-17 MED ORDER — REPATHA SURECLICK 140 MG/ML ~~LOC~~ SOAJ: SUBCUTANEOUS | 3 refills | Status: DC

## 2022-12-26 ENCOUNTER — Ambulatory Visit: Payer: BC Managed Care – PPO | Admitting: Internal Medicine

## 2022-12-26 ENCOUNTER — Other Ambulatory Visit: Payer: Self-pay | Admitting: Physician Assistant

## 2022-12-26 DIAGNOSIS — I2510 Atherosclerotic heart disease of native coronary artery without angina pectoris: Secondary | ICD-10-CM

## 2022-12-26 DIAGNOSIS — I10 Essential (primary) hypertension: Secondary | ICD-10-CM

## 2022-12-26 DIAGNOSIS — E7849 Other hyperlipidemia: Secondary | ICD-10-CM

## 2022-12-29 NOTE — Progress Notes (Unsigned)
Cardiology Office Note:    Date:  12/30/2022   ID:  Sean [name], DOB Oct 15, 1971, MRN [id number]  PCP:  Loralee Pacas, MD   [facility] Providers Cardiologist:  Werner Lean, MD     Referring MD: [name], [place]   CC: F/u paplitations  History of Present Illness:    Sean [name] is a 52 y.o. male with a hx of  CAD s/p CABG in 2017 (LIMA to LAD), HTN, HLD, Morbid Obesity, PAF with prior DCCV.  OSA on CPAP, and Recovered ischemia cardiomyopathy.  He has a history of statin intolerance.  Seen 08/13/21.  Did not get echo in interim.  Stress was WNL. 2023: pre-operative eval  Patient notes that he is doing great.   Lost ~ 30 lbs. Stopped drinking. No longer uses sugary drinks.  Has had CKD- his lasix was switched for an SGLT2i. BP is much improved. There are no interval hospital/ED visit.    No chest pain or pressure .  No SOB/DOE and no PND/Orthopnea.  No weight gain or leg swelling.  No palpitations or syncope.  Wears his compression stockings now.  Past Medical History:  Diagnosis Date   Abnormal liver enzymes    a. Sees a doctor in [place].   Cardiac cirrhosis    a. possible elevated LFTs/low platelets felt due to cardiac cirrhosis per 2017 admission.   Chronic combined systolic and diastolic CHF (congestive heart failure) (HCC)    CKD (chronic kidney disease), stage II    Stage 4   Congenital heart defect    a. rightward rotation of heart, almost dextrocardia   Coronary artery disease    a. s/p CABGx1 in 12/2015.   Hematuria    a. Chronic hx of this, no prior etiology determined through workup per patient.   History of umbilical hernia repair 21/30/8657   History of hernia surgery twice prior   Hypercholesteremia    a. Prev taken off statin due to abnormal liver function.   Hypertension    Incisional hernia 07/10/2011   History of hernia surgery twice prior   Morbid obesity ([place])    Morbid obesity with BMI of 50.0-59.9, adult ([place])  01/04/2016   After [place] [facility] doctor is setting him up with a wellness clinic to help with weight losS  He reports not having tried anything for weight loss either diet or or medicine or surgery in the past  We failed to get [facility] 10/2022      Wt Readings from Last 10 Encounters:  11/13/22  (!) 384 lb 12.8 oz (174.5 kg)  10/13/22  (!) 385 lb (174.6 kg)  09/30/22  (!) 384 lb (174.2 kg)  09/24/22  (!) 382 lb (1   Nausea and vomiting 11/25/2022   OSA on CPAP    Paroxysmal atrial flutter ([place]) 02/12/2016   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   LIMA to LAD   Sinus bradycardia    Thrombocytopenia ([place])     Past Surgical History:  Procedure Laterality Date   APPENDECTOMY     BIOPSY  02/19/2021   Procedure: BIOPSY;  Surgeon: Thornton Park, MD;  Location: [facility];  Service: Gastroenterology;;   BIOPSY  11/25/2022   Procedure: BIOPSY;  Surgeon: Thornton Park, MD;  Location: [facility];  Service: Gastroenterology;;   CARDIAC CATHETERIZATION N/A 12/24/2015   Procedure: Right/Left Heart Cath and Coronary Angiography;  Surgeon: Jolaine Artist, MD;  Location: [facility];  Service: Cardiovascular;  Laterality: N/A;  CARDIOVERSION N/A 02/14/2016   Procedure: CARDIOVERSION;  Surgeon: Pixie Casino, MD;  Location: [facility];  Service: Cardiovascular;  Laterality: N/A;   COLONOSCOPY WITH PROPOFOL N/A 02/19/2021   Procedure: COLONOSCOPY WITH PROPOFOL;  Surgeon: Thornton Park, MD;  Location: [facility];  Service: Gastroenterology;  Laterality: N/A;   CORONARY ARTERY BYPASS GRAFT N/A 12/26/2015   Procedure: Off Pump Coronary Artery Bypass Grafting times one using left internal mammary artery;  Surgeon: Rexene Alberts, MD;  Location: [facility];  Service: Open Heart Surgery;  Laterality: N/A;   ESOPHAGOGASTRODUODENOSCOPY (EGD) WITH PROPOFOL N/A 02/19/2021   Procedure: ESOPHAGOGASTRODUODENOSCOPY (EGD) WITH PROPOFOL;  Surgeon: Thornton Park, MD;  Location: [facility];  Service:  Gastroenterology;  Laterality: N/A;   ESOPHAGOGASTRODUODENOSCOPY (EGD) WITH PROPOFOL N/A 11/25/2022   Procedure: ESOPHAGOGASTRODUODENOSCOPY (EGD) WITH PROPOFOL;  Surgeon: Thornton Park, MD;  Location: [facility];  Service: Gastroenterology;  Laterality: N/A;   GALLBLADDER SURGERY     HERNIA REPAIR     LEFT HEART CATH AND CORS/GRAFTS ANGIOGRAPHY N/A 04/15/2017   Procedure: Left Heart Cath and Cors/Grafts Angiography;  Surgeon: Troy Sine, MD;  Location: [facility];  Service: Cardiovascular;  Laterality: N/A;   LEFT HEART CATH AND CORS/GRAFTS ANGIOGRAPHY N/A 11/02/2020   Procedure: LEFT HEART CATH AND CORS/GRAFTS ANGIOGRAPHY;  Surgeon: Sherren Mocha, MD;  Location: [facility];  Service: Cardiovascular;  Laterality: N/A;   POLYPECTOMY  02/19/2021   Procedure: POLYPECTOMY;  Surgeon: Thornton Park, MD;  [facility];  Service: Gastroenterology;;   TEE WITHOUT CARDIOVERSION N/A 12/26/2015   Procedure: TRANSESOPHAGEAL ECHOCARDIOGRAM (TEE);  Surgeon: Rexene Alberts, MD;  Location: [facility];  Service: Open Heart Surgery;  Laterality: N/A;   TEE WITHOUT CARDIOVERSION N/A 02/14/2016   Procedure: TRANSESOPHAGEAL ECHOCARDIOGRAM (TEE);  Surgeon: Pixie Casino, MD;  Location: [facility] ENDOSCOPY;  Service: Cardiovascular;  Laterality: N/A;    Current Medications: Current Meds  Medication Sig   acetaminophen (TYLENOL) 500 MG tablet Take 500-1,500 mg by mouth as needed for moderate pain, fever, headache or mild pain.   allopurinol (ZYLOPRIM) 100 MG tablet TAKE 1 TABLET(100 MG) BY MOUTH DAILY   amitriptyline (ELAVIL) 25 MG tablet Take 1 tablet (25 mg total) by mouth at bedtime.   apixaban (ELIQUIS) 5 MG TABS tablet Take 1 tablet (5 mg total) by mouth 2 (two) times daily.   azelastine (ASTELIN) 0.1 % nasal spray Place 2 sprays into both nostrils 2 (two) times daily. (Patient taking differently: Place 2 sprays into both nostrils 2 (two) times daily as needed for allergies.)    Cyanocobalamin (VITAMIN B-12) 5000 MCG SUBL Place 5,000 mcg under the tongue daily.   diltiazem (CARDIZEM SR) 120 MG 12 hr capsule TAKE 1 CAPSULE(120 MG) BY MOUTH TWICE DAILY   docusate sodium (STOOL SOFTENER) 100 MG capsule Take 1 capsule (100 mg total) by mouth daily. (Patient taking differently: Take 100 mg by mouth daily as needed for moderate constipation.)   empagliflozin (JARDIANCE) 10 MG TABS tablet Take 1 tablet (10 mg total) by mouth daily before breakfast.   Evolocumab (REPATHA SURECLICK) 568 MG/ML SOAJ INJECT THE CONTENTS OF 1 SYRINGE UNDER THE SKIN EVERY 14 DAYS   famotidine (PEPCID) 20 MG tablet TAKE 1 TABLET(20 MG) BY MOUTH TWICE DAILY   levothyroxine (SYNTHROID) 50 MCG tablet TAKE 1 TABLET(50 MCG) BY MOUTH DAILY BEFORE BREAKFAST   losartan (COZAAR) 25 MG tablet Take 1 tablet (25 mg total) by mouth daily.   metoprolol succinate (TOPROL-XL) 25 MG 24 hr tablet  TAKE 1 TABLET(25 MG) BY MOUTH DAILY   montelukast (SINGULAIR) 10 MG tablet TAKE 1 TABLET(10 MG) BY MOUTH DAILY   nitroGLYCERIN (NITROSTAT) 0.4 MG SL tablet Place 1 tablet (0.4 mg total) under the tongue every 5 (five) minutes as needed for chest pain.   pantoprazole (PROTONIX) 40 MG tablet TAKE 1 TABLET(40 MG) BY MOUTH TWICE DAILY   polyethylene glycol powder (GLYCOLAX/MIRALAX) 17 GM/SCOOP powder Take 1 dose daily. May increase to BID if needed (Patient taking differently: Take 17 g by mouth daily as needed for moderate constipation.)   potassium chloride (KLOR-CON M) 10 MEQ tablet TAKE 1 TABLET(10 MEQ) BY MOUTH DAILY   sucralfate (CARAFATE) 1 g tablet TAKE 1 TABLET(1 GRAM) BY MOUTH TWICE DAILY. MAY INCREASE TO 4 TIMES DAILY FOR ANY ACUTE SYMPTOMS   tirzepatide (ZEPBOUND) 2.5 MG/0.5ML Pen Inject 2.5 mg into the skin once a week.   valACYclovir (VALTREX) 500 MG tablet Take 500 mg by mouth 2 (two) times daily.   valACYclovir (VALTREX) 500 MG tablet Take 1 tablet (500 mg total) by mouth daily.     Allergies:   Doxycycline, Imdur  [isosorbide dinitrate], Valtrex [valacyclovir], Augmentin [amoxicillin-pot clavulanate], and Tape   Social History   Socioeconomic History   Marital status: Legally Separated    Spouse name: Not on file   Number of children: Not on file   Years of education: Not on file   Highest education level: Not on file  Occupational History   Occupation: Truck Geophysicist/field seismologist  Tobacco Use   Smoking status: Never   Smokeless tobacco: Never  Vaping Use   Vaping Use: Never used  Substance and Sexual Activity   Alcohol use: Yes    Comment: 6 beers per month   Drug use: No   Sexual activity: Not Currently  Other Topics Concern   Not on file  Social History Narrative   Truck driver -- regional   Lives with mom and dad   Social Determinants of Health   Financial Resource Strain: Not on file  Food Insecurity: No Food Insecurity (07/04/2022)   Hunger Vital Sign    Worried About Running Out of Food in the Last Year: Never true    Ran Out of Food in the Last Year: Never true  Transportation Needs: Not on file  Physical Activity: Not on file  Stress: Not on file  Social Connections: Not on file    Social: 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  ROS:   Please see the history of present illness.     All other systems reviewed and are negative.  EKGs/Labs/Other Studies Reviewed:    The following studies were reviewed today:  EKG:   02/10/22: SR rate 60 LVH with secondary repol 08/13/21: sinus brady rate 53 with LVH  Cardiac Studies & Procedures   CARDIAC CATHETERIZATION  CARDIAC CATHETERIZATION 11/02/2020  Narrative 1.  Severe single-vessel coronary artery disease with total occlusion of the ostial LAD 2.  Widely patent left main, left circumflex, and RCA with no  significant stenoses 3.  Patent LIMA to LAD with no significant stenosis present 4.  Normal LVEDP  Recommendations: Ongoing medical therapy.  The patient has stable coronary anatomy with continued patency of the LIMA graft.  Patient may resume apixaban tomorrow morning at his normal dosing  schedule.  Findings Coronary Findings Diagnostic  Dominance: Right  Left Anterior Descending Ost LAD lesion is 100% stenosed. The ostium of the LAD is flush occluded, unchanged from the previous heart catheterization. Mid LAD lesion is 30% stenosed.  Left Circumflex  First Obtuse Marginal Branch The circumflex and its branch vessels are patent with no significant stenoses.  The circumflex supplies 2 proximal obtuse marginal branches with no stenoses and a posterior lateral branch with no stenosis.  Right Coronary Artery Vessel is large. There is mild diffuse disease throughout the vessel. The right coronary artery is patent throughout.  There are diffuse irregularities.  There are no significant stenoses. Prox RCA lesion is 15% stenosed.  LIMA LIMA Graft To Mid LAD LIMA and is normal in caliber. The LIMA to LAD graft is widely patent with no significant stenosis.  There is mild stenosis in the mid LAD just beyond the LIMA insertion that appears about 30% narrowed.  Intervention  No interventions have been documented.   CARDIAC CATHETERIZATION  CARDIAC CATHETERIZATION 04/15/2017  Narrative  Ost LAD lesion, 100 %stenosed.  Prox RCA lesion, 15 %stenosed.  Mid LAD lesion, 25 %stenosed.  LIMA and is normal in caliber and anatomically normal.  The left ventricular ejection fraction is 50-55% by visual estimate.  The left ventricular systolic function is normal.  Normal LV function with an ejection fraction of 50-55% without definitive focal segmental wall motion abnormalities.  Significant native CAD with total occlusion of the LAD at the ostium; normal codominant left circumflex vessel,  and RCA with smooth 10% luminal narrowing of the proximal vessel.  Patent LIMA graft supplying the LAD.  There is a mild 25% smooth narrowing in the LAD beyond the anastomosis.  RECOMMENDATION: Medical therapy.  Findings Coronary Findings Diagnostic  Dominance: Right  Left Anterior Descending  Right Coronary Artery  LIMA LIMA Graft To Mid LAD LIMA and is normal in caliber and anatomically normal.  Intervention  No interventions have been documented.   STRESS TESTS  MYOCARDIAL PERFUSION IMAGING 10/04/2021  Narrative   Findings are consistent with no prior ischemia and no prior myocardial infarction. The study is low risk.   No ST deviation was noted.   LV perfusion is abnormal. There is no evidence of ischemia. There is evidence of infarction. Defect 1: There is a small defect with moderate reduction in uptake present in the apical to mid inferior location(s) that is fixed. There is normal wall motion in the defect area. Consistent with artifact caused by diaphragmatic attenuation. Defect 2: There is a small defect with mild reduction in uptake present in the apical to mid anterior location(s). There is normal wall motion in the defect area. Consistent with artifact.  Cannot rule out prior infarct, but there is no ischemia.   Left ventricular function is abnormal. Global function is mildly reduced. Nuclear stress EF: 47 %. The left ventricular ejection fraction is mildly decreased (45-54%). End diastolic cavity size is severely enlarged. End systolic cavity size is severely enlarged.   Prior study available for comparison from 10/17/2020. There are changes compared to prior study which appear to be improved with no ischemia present. Segmental wall-motion abnormalities have improved.   ECHOCARDIOGRAM  ECHOCARDIOGRAM COMPLETE 03/31/2016  Narrative *[facility]* [address], [place]  [address] [phone]  ------------------------------------------------------------------- Echocardiography  Patient:    Caliber, Landess MR #:       [id number] Study Date: 03/31/2016 Gender:     M Age:  45 Height:     180.3 cm Weight:     147.9 kg BSA:        2.79 m^2 Pt. Status: Room:  SONOGRAPHER  Wyatt Mage, RDCS ATTENDING    Rosita Fire, [place], Brittainy M REFERRING    Rosita Fire, [place], Outpatient  cc:  ------------------------------------------------------------------- LV EF: 45% -   50%  ------------------------------------------------------------------- Indications:      Atrial fibrillation (I48.92).  ------------------------------------------------------------------- History:   PMH:   Atrial flutter.  Coronary artery disease.  Risk factors:  Bradycardia. Hypertension. Dyslipidemia.  ------------------------------------------------------------------- Study Conclusions  - Procedure narrative: Transthoracic echocardiography. Image quality was suboptimal. The study was technically difficult. Intravenous contrast (Definity) was administered. - Left ventricle: The cavity size was normal. Wall thickness was normal. Systolic function was mildly reduced. The estimated ejection fraction was in the range of 45% to 50%. Features are consistent with a pseudonormal left ventricular filling pattern, with concomitant abnormal relaxation and increased filling pressure (grade 2 diastolic dysfunction).  Impressions:  - The echo is extremely difficult. The views of the LV with Definity contrast shows that the LV function is likely normal. Specific wall motin information is not able to be determined by this echo. No specific valvular information can be stated on this echo  ------------------------------------------------------------------- Labs, prior tests, procedures, and surgery: Transthoracic echocardiography  (12/24/2015).     EF was 65%.  Transesophageal echocardiography (02/14/2016).     EF was 40%. Coronary artery bypass grafting. Echocardiography.  M-mode, complete 2D, spectral Doppler, and color Doppler.  Birthdate:  Patient birthdate: [birth year].  Age:  Patient is 52 yr old.  Sex:  Gender: male.    BMI: 45.5 kg/m^2.  Blood pressure:     122/83  Patient status:  Outpatient.  Study date: Study date: 03/31/2016. Study time: 10:30 AM.  Location:  [facility]  -------------------------------------------------------------------  ------------------------------------------------------------------- Left ventricle:  The echo is extremely difficult. The views of the LV with Definity contrast shows that the LV function is likely normal. Specific wall motin information is not able to be determined by this echo. No specific valvular information can be stated on this echo The cavity size was normal. Wall thickness was normal. Systolic function was mildly reduced. The estimated ejection fraction was in the range of 45% to 50%. Features are consistent with a pseudonormal left ventricular filling pattern, with concomitant abnormal relaxation and increased filling pressure (grade 2 diastolic dysfunction).  ------------------------------------------------------------------- Aorta:  Aortic root: The aortic root was normal in size. Ascending aorta: The ascending aorta was normal in size.  ------------------------------------------------------------------- Mitral valve:   Doppler:     Peak gradient (D): 2 mm Hg.  ------------------------------------------------------------------- Left atrium:  The atrium was normal in size.  ------------------------------------------------------------------- Pulmonic valve:    The valve appears to be grossly normal. Doppler:  There was no significant regurgitation.  ------------------------------------------------------------------- Measurements  Left  ventricle                         Value        Reference LV ID, ED, PLAX chordal                48.2  mm     43 - 52 LV ID, ES, PLAX chordal        (H)     38.3  mm     23 - 38 LV fx shortening, PLAX chordal (L)  21    %      >=29 LV PW thickness, ED                    9.46  mm     --------- IVS/LV PW ratio, ED                    1.09         <=1.3 LV e&', lateral                         13.4  cm/s   --------- LV E/e&', lateral                       5.4          --------- LV e&', medial                          6.12  cm/s   --------- LV E/e&', medial                        11.81        --------- LV e&', average                         9.76  cm/s   --------- LV E/e&', average                       7.41         ---------  Ventricular septum                     Value        Reference IVS thickness, ED                      10.3  mm     ---------  LVOT                                   Value        Reference LVOT peak velocity, S                  122   cm/s   --------- LVOT mean velocity, S                  77.6  cm/s   --------- LVOT VTI, S                            25.1  cm     --------- LVOT peak gradient, S                  6     mm Hg  ---------  Aorta                                  Value        Reference Aortic root ID, ED                     40    mm     --------- Ascending aorta ID, A-P, S  39    mm     ---------  Left atrium                            Value        Reference LA ID, A-P, ES                         50    mm     --------- LA ID/bsa, A-P                         1.79  cm/m^2 <=2.2 LA volume, S                           65    ml     --------- LA volume/bsa, S                       23.3  ml/m^2 --------- LA volume, ES, 1-p A4C                 88.1  ml     --------- LA volume/bsa, ES, 1-p A4C             31.5  ml/m^2 --------- LA volume, ES, 1-p A2C                 42.4  ml     --------- LA volume/bsa, ES, 1-p A2C             15.2  ml/m^2  ---------  Mitral valve                           Value        Reference Mitral E-wave peak velocity            72.3  cm/s   --------- Mitral A-wave peak velocity            52.4  cm/s   --------- Mitral deceleration time       (H)     246   ms     150 - 230 Mitral peak gradient, D                2     mm Hg  --------- Mitral E/A ratio, peak                 1.4          ---------  Systemic veins                         Value        Reference Estimated CVP                          3     mm Hg  ---------  Right ventricle                        Value        Reference RV s&', lateral, S                      10.9  cm/s   ---------  Legend: (L)  and  (H)  mark values outside  specified reference range.  ------------------------------------------------------------------- Prepared and Electronically Authenticated by  Mertie Moores, M.D. 2017-05-01T15:23:00   TEE  ECHO TEE 02/14/2016  Narrative *[facility]* [address], [place] [address] [phone]  ------------------------------------------------------------------- Transesophageal Echocardiography with Cardioversion  Patient:    Amed, Datta MR #:       [id number] Study Date: 02/14/2016 Gender:     M Age:        28 Height:     180.3 cm Weight:     149.1 kg BSA:        2.81 m^2 Pt. Status: Room:       [id number]  Marca Ancona, Evelene Croon PERFORMING   Lyman Bishop MD ADMITTING    Ena Dawley, M.D. ATTENDING    Ena Dawley, M.D. SONOGRAPHER  Roseanna Rainbow  cc:  ------------------------------------------------------------------- LV EF: 35% -   40%  ------------------------------------------------------------------- History:   PMH:  OSA.  Atrial fibrillation.  Atrial flutter. Coronary artery disease.  Risk factors:  Hypertension. Dyslipidemia.  ------------------------------------------------------------------- Study Conclusions  - Left ventricle: There was mild  concentric hypertrophy. Systolic function was moderately reduced. The estimated ejection fraction was in the range of 35% to 40%. Diffuse hypokinesis. No evidence of thrombus. - Mitral valve: There was mild regurgitation. - Left atrium: The atrium was dilated. No evidence of thrombus in the atrial cavity or appendage. No evidence of thrombus in the atrial cavity or appendage. - Pulmonary veins: No anomaly. - Right atrium: No evidence of thrombus in the atrial cavity or appendage. - Atrial septum: Anuerysmal. No defect or patent foramen ovale was identified. - Pulmonic valve: No evidence of vegetation.  Impressions:  - Successful cardioversion. No cardiac source of emboli was indentified.  ------------------------------------------------------------------- Labs, prior tests, procedures, and surgery: Coronary artery bypass grafting.  Transesophageal echocardiography with cardioversion.  2D and intravenous contrast injection.  Birthdate:  Patient birthdate: 05-Feb-1971.  Age:  Patient is 52 yr old.  Sex:  Gender: male. BMI: 45.8 kg/m^2.  Blood pressure:     100/64  Patient status: Inpatient.  Study date:  Study date: 02/14/2016. Study time: 11:52 AM.  Location:  Endoscopy.  -------------------------------------------------------------------  ------------------------------------------------------------------- Left ventricle:  There was mild concentric hypertrophy. Systolic function was moderately reduced. The estimated ejection fraction was in the range of 35% to 40%. Diffuse hypokinesis.  No evidence of thrombus.  ------------------------------------------------------------------- Aortic valve:   Trileaflet.  ------------------------------------------------------------------- Aorta:  The aorta was normal, not dilated, and non-diseased.  ------------------------------------------------------------------- Mitral valve:   Doppler:  There was mild  regurgitation.  ------------------------------------------------------------------- Left atrium:  The atrium was dilated.  No evidence of thrombus in the atrial cavity or appendage.  No evidence of thrombus in the atrial cavity or appendage.  ------------------------------------------------------------------- Atrial septum:  Anuerysmal. No defect or patent foramen ovale was identified.  ------------------------------------------------------------------- Pulmonary veins:  No anomaly.  ------------------------------------------------------------------- Right ventricle:  The cavity size was normal. Wall thickness was normal. Systolic function was normal.  ------------------------------------------------------------------- Pulmonic valve:    Structurally normal valve.   Cusp separation was normal.  No evidence of vegetation.  Doppler:  There was trivial regurgitation.  ------------------------------------------------------------------- Pulmonary artery:   The main pulmonary artery was normal-sized.  ------------------------------------------------------------------- Right atrium:  The atrium was normal in size.  No evidence of thrombus in the atrial cavity or appendage.  ------------------------------------------------------------------- Pericardium:  There was no pericardial effusion.  ------------------------------------------------------------------- Post procedure conclusions Ascending Aorta:  - The aorta was normal, not dilated, and non-diseased.  -------------------------------------------------------------------  Prepared and Electronically Authenticated by  Lyman Bishop MD 2017-03-16T16:51:54   [place] (3-14 DAYS) 01/01/2022  Narrative  Patient had a minimum heart rate of 36 bpm, maximum heart rate of 115 bpm, and average heart rate of 61 bpm.  Predominant underlying rhythm was sinus rhythm.  Isolated PACs were rare (<1.0%).  Isolated  PVCs were rare (<1.0%).  Four nocturnal sinus pauses occurred, the longest lasting 3.3 seconds.  Triggered and diary events associated with sinus bradycardia.  No malignant arrhythmias.   CT SCANS  CT CORONARY MORPH W/CTA COR W/SCORE 12/25/2015  Addendum 12/25/2015  3:48 PM ADDENDUM REPORT: 12/25/2015 15:46 CLINICAL DATA:  52 year old male with severe proximal LAD lesion scheduled for a CABG and suspicion for dextrocardia on cardiac catheterization. EXAM: Cardiac/Coronary  CT TECHNIQUE: The patient was scanned on a Philips 256 scanner. FINDINGS: A 120 kV prospective scan was triggered in the descending thoracic aorta at 111 HU's. Axial non-contrast 40m slices were carried out through the heart. The data set was analyzed on a dedicated work station and scored using the [place] G[place] rotation speed was 270 msecs and collimation was .9 mm. No beta blockade or NTG was given. The 3D data set was reconstructed in 5% intervals of the 67-82 % of the R-R cycle. Diastolic phases were analyzed on a dedicated work station using MPR, MIP and VRT modes. The patient received 80 cc of contrast. Aorta: Normal caliber, no calcifications or dissection. Normal origin of the brachiocephalic arteries. Aortic Valve:  Trileaflet, no calcifications. Coronary Arteries: Originating in a normal position. Right dominance. Left main is short with no obvious plaque. Ostial to proximal LAD has a severe mixed plaque associated with > 90% stenosis. The plaque has almost circumferential calcifications but also has non-calcified portion. There is another mild calcified plaque in teh mid LAD associated with 25-50% stenosis. LCX is nondominant vessel. There is mild calcified plaque in OM1 associated with 25-50% stenosis. RCA is a dominant vessel with minimal calcifications in the proximal and mid portion. Other findings: The heart is rotated anteriorly with apex-base axis directed antero-posteriorly,  no obvious dextrocardia present. No other anomalies such as situs inversus were seen. Mild concentric hypertrophy. Dilated pulmonary artery measuring 30 x 36 mm suggestive of pulmonary hypertension. Normal size of the right sided chambers. Normal pulmonary veins draining into the left atrium. No ASD or VSD.  There is a possible small PFO. IMPRESSION: 1. Coronary calcium score of 327. This was 956percentile for age and sex matched control. 2. Severe obstructive CAD in the ostial to proximal LAD with mixed plaque associated with > 90% stenosis. Only mild non-obstructive plaque in LCX and RCA. 3. Anterior rotation of the cardiac apex, no obvious dextrocardia or other anomalies such as situs inversus. 4. Mild concentric LVH. 5. Dilated pulmonary artery suggestive of pulmonary hypertension. 6. Possible PFO. KEna DawleyElectronically Signed By: KEna DawleyOn: 12/25/2015 15:46  Narrative EXAM: OVER-READ INTERPRETATION  CT CHEST  The following report is an over-read performed by radiologist Dr. DRebekah [facility] on 12/25/2015. This over-read does not include interpretation of cardiac or coronary anatomy or pathology. The coronary calcium score/coronary CTA interpretation by the cardiologist is attached.  COMPARISON:  No priors.  FINDINGS: Within the visualized portions of the thorax there are no suspicious appearing pulmonary nodules or masses, there is no acute consolidative airspace disease, no pleural effusions and no pneumothorax. Linear areas of scarring are noted in the lung bases bilaterally,  particularly in the left lower lobe. No lymphadenopathy in the visualize mediastinal or hilar nodal stations. Visualized portions of the upper abdomen are remarkable for postoperative changes of prior cholecystectomy. There are no aggressive appearing lytic or blastic lesions noted in the visualized portions of the skeleton.  IMPRESSION: 1. No  acute incidental noncardiac findings noted.  Electronically Signed: By: Vinnie Langton M.D. On: 12/25/2015 15:06            Recent Labs: 09/03/2022: TSH 2.96 09/24/2022: Hemoglobin 11.4; Platelets 62.0 09/30/2022: ALT 29; BUN 27; Creatinine, Ser 2.51; Potassium 4.1; Sodium 142  Recent Lipid Panel    Component Value Date/Time   CHOL 107 08/13/2020 0909   TRIG 118 08/13/2020 0909   HDL 49 08/13/2020 0909   CHOLHDL 2.2 08/13/2020 0909   CHOLHDL 2.7 11/26/2016 0912   VLDL 18 11/26/2016 0912   LDLCALC 37 08/13/2020 0909   LDLDIRECT 45 10/21/2019 0942        Physical Exam:    VS:  BP 112/66 (BP Location: Left Arm, Patient Position: Sitting, Cuff Size: Large)   Pulse 95   Ht '5\' 11"'$  (1.803 m)   Wt (!) 367 lb 12.8 oz (166.8 kg)   SpO2 (!) 65%   BMI 51.30 kg/m     Wt Readings from Last 3 Encounters:  12/30/22 (!) 367 lb 12.8 oz (166.8 kg)  12/15/22 (!) 373 lb (169.2 kg)  12/08/22 (!) 388 lb 12.8 oz (176.4 kg)    Gen: no distress, morbid obesity   Neck: No JVD,  Cardiac: No Rubs or Gallops, no Murmur, RRR +2 radial pulses Respiratory: Clear to auscultation bilaterally, normal effort, normal  respiratory rate GI: Soft, nontender, non-distended  MS: +1  edema bilateral with compression socks;  moves all extremities Integument: Skin feels warm Neuro:  At time of evaluation, alert and oriented to person/place/time/situation  Psych: Normal affect, patient feels ok   ASSESSMENT:    1. 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 Aortic dilatation (HCC)   3. Obstructive sleep apnea syndrome   4. Ectatic aorta ([place])   5. Atrial flutter with rapid ventricular response ([place])   6. 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  Concerns regarding medicines are outlined above.  Orders Placed This Encounter  Procedures   MYOCARDIAL PERFUSION IMAGING   ECHOCARDIOGRAM COMPLETE    No orders of the defined types were placed in this encounter.    Patient Instructions  Medication Instructions:  Your physician recommends that you continue on your current medications as directed. Please refer to the Current Medication list given to you today.  *If you need a refill on your cardiac medications before your next appointment, please call your pharmacy*   Lab Work: NONE If you have labs (blood work) drawn today and your tests are completely normal, you will receive your results  only by: [employer] (if you have MyChart) OR A paper copy in the mail If you have any lab test that is abnormal or we need to change your treatment, we will call you to review the results.   Testing/Procedures: Your physician has requested that you have a lexiscan myoview. For further information please visit [email or web address]. Please follow instruction sheet, as given.   You are scheduled for a Myocardial Perfusion Imaging Study. Please arrive 15 minutes prior to your appointment time for registration and insurance purposes.   The test will take approximately 3 to 4 hours to complete; you may bring reading material.  If someone comes with you to your appointment, they will need to remain in the main lobby due to limited space in the testing area.    How to prepare for your Myocardial Perfusion Test: Do not eat or drink 3 hours prior to your test, except you may have  water. Do not consume products containing caffeine (regular or decaffeinated) 12 hours prior to your test. (ex: coffee, chocolate, sodas, tea). Do bring a list of your current medications with you.  If not listed below, you may take your medications as normal. Do wear comfortable clothes (no dresses or overalls) and walking shoes, tennis shoes preferred (No heels or open toe shoes are allowed). Do NOT wear cologne, perfume, aftershave, or lotions (deodorant is allowed). If these instructions are not followed, your test will have to be rescheduled.  If you cannot keep your appointment, please provide 24 hours notification to the [facility], to avoid a possible $50 charge to your account.      Your physician has requested that you have an echocardiogram. Echocardiography is a painless test that uses sound waves to create images of your heart. It provides your doctor with information about the size and shape of your heart and how well your heart's chambers and valves are working. This procedure takes approximately one hour. There are no restrictions for this procedure. Please do NOT wear cologne, perfume, aftershave, or lotions (deodorant is allowed). Please arrive 15 minutes prior to your appointment time.       Follow-Up: At [facility], you and your health needs are our priority.  As part of our continuing mission to provide you with exceptional heart care, we have created designated Provider Care Teams.  These Care Teams include your primary Cardiologist (physician) and Advanced Practice Providers (APPs -  Physician Assistants and Nurse Practitioners) who all work together to provide you with the care you need, when you need it.  We recommend signing up for the patient portal called "MyChart".  Sign up information is provided on this After Visit Summary.  MyChart is used to connect with patients for Virtual Visits (Telemedicine).  Patients are able to view lab/test results, encounter  notes, upcoming appointments, etc.  Non-urgent messages can be sent to your provider as well.   To learn more about what you can do with MyChart, go to [email or web address].    Your next appointment:   1 year(s)  Provider:   Werner Lean, MD       Signed, Werner Lean, MD  12/30/2022 6:00 PM    [place]

## 2022-12-30 ENCOUNTER — Ambulatory Visit: Payer: BC Managed Care – PPO | Attending: Internal Medicine | Admitting: Internal Medicine

## 2022-12-30 ENCOUNTER — Encounter: Payer: Self-pay | Admitting: Internal Medicine

## 2022-12-30 VITALS — BP 112/66 | HR 95 | Ht 71.0 in | Wt 367.8 lb

## 2022-12-30 DIAGNOSIS — I25812 Atherosclerosis of bypass graft of coronary artery of transplanted heart without angina pectoris: Secondary | ICD-10-CM | POA: Diagnosis not present

## 2022-12-30 DIAGNOSIS — G4733 Obstructive sleep apnea (adult) (pediatric): Secondary | ICD-10-CM

## 2022-12-30 DIAGNOSIS — I77819 Aortic ectasia, unspecified site: Secondary | ICD-10-CM | POA: Diagnosis not present

## 2022-12-30 DIAGNOSIS — I4892 Unspecified atrial flutter: Secondary | ICD-10-CM

## 2022-12-30 DIAGNOSIS — I10 Essential (primary) hypertension: Secondary | ICD-10-CM

## 2022-12-30 NOTE — Patient Instructions (Signed)
Medication Instructions:  Your physician recommends that you continue on your current medications as directed. Please refer to the Current Medication list given to you today.  *If you need a refill on your cardiac medications before your next appointment, please call your pharmacy*   Lab Work: NONE If you have labs (blood work) drawn today and your tests are completely normal, you will receive your results only by: [employer] (if you have MyChart) OR A paper copy in the mail If you have any lab test that is abnormal or we need to change your treatment, we will call you to review the results.   Testing/Procedures: Your physician has requested that you have a lexiscan myoview. For further information please visit [email or web address]. Please follow instruction sheet, as given.   You are scheduled for a Myocardial Perfusion Imaging Study. Please arrive 15 minutes prior to your appointment time for registration and insurance purposes.   The test will take approximately 3 to 4 hours to complete; you may bring reading material.  If someone comes with you to your appointment, they will need to remain in the main lobby due to limited space in the testing area.    How to prepare for your Myocardial Perfusion Test: Do not eat or drink 3 hours prior to your test, except you may have water. Do not consume products containing caffeine (regular or decaffeinated) 12 hours prior to your test. (ex: coffee, chocolate, sodas, tea). Do bring a list of your current medications with you.  If not listed below, you may take your medications as normal. Do wear comfortable clothes (no dresses or overalls) and walking shoes, tennis shoes preferred (No heels or open toe shoes are allowed). Do NOT wear cologne, perfume, aftershave, or lotions (deodorant is allowed). If these instructions are not followed, your test will have to be rescheduled.  If you cannot keep your appointment, please provide 24 hours  notification to the Nuclear Lab, to avoid a possible $50 charge to your account.      Your physician has requested that you have an echocardiogram. Echocardiography is a painless test that uses sound waves to create images of your heart. It provides your doctor with information about the size and shape of your heart and how well your heart's chambers and valves are working. This procedure takes approximately one hour. There are no restrictions for this procedure. Please do NOT wear cologne, perfume, aftershave, or lotions (deodorant is allowed). Please arrive 15 minutes prior to your appointment time.       Follow-Up: At [facility], you and your health needs are our priority.  As part of our continuing mission to provide you with exceptional heart care, we have created designated Provider Care Teams.  These Care Teams include your primary Cardiologist (physician) and Advanced Practice Providers (APPs -  Physician Assistants and Nurse Practitioners) who all work together to provide you with the care you need, when you need it.  We recommend signing up for the patient portal called "MyChart".  Sign up information is provided on this After Visit Summary.  MyChart is used to connect with patients for Virtual Visits (Telemedicine).  Patients are able to view lab/test results, encounter notes, upcoming appointments, etc.  Non-urgent messages can be sent to your provider as well.   To learn more about what you can do with MyChart, go to [email or web address].    Your next appointment:   1 year(s)  Provider:   Werner Lean, MD

## 2023-01-07 ENCOUNTER — Other Ambulatory Visit (HOSPITAL_COMMUNITY): Payer: Self-pay

## 2023-01-15 ENCOUNTER — Ambulatory Visit: Payer: BC Managed Care – PPO | Admitting: Internal Medicine

## 2023-01-16 ENCOUNTER — Encounter: Payer: Self-pay | Admitting: Gastroenterology

## 2023-01-21 ENCOUNTER — Encounter (INDEPENDENT_AMBULATORY_CARE_PROVIDER_SITE_OTHER): Payer: Self-pay | Admitting: Family Medicine

## 2023-01-26 ENCOUNTER — Inpatient Hospital Stay: Payer: BC Managed Care – PPO | Attending: Oncology | Admitting: Oncology

## 2023-01-26 ENCOUNTER — Inpatient Hospital Stay: Payer: BC Managed Care – PPO

## 2023-01-26 ENCOUNTER — Encounter: Payer: Self-pay | Admitting: Internal Medicine

## 2023-01-26 ENCOUNTER — Ambulatory Visit (INDEPENDENT_AMBULATORY_CARE_PROVIDER_SITE_OTHER): Payer: BC Managed Care – PPO | Admitting: Internal Medicine

## 2023-01-26 VITALS — BP 120/78 | HR 144 | Temp 99.3°F | Resp 18 | Ht 71.0 in | Wt 363.9 lb

## 2023-01-26 VITALS — Temp 98.5°F | Ht 71.0 in | Wt 363.8 lb

## 2023-01-26 DIAGNOSIS — Z1152 Encounter for screening for COVID-19: Secondary | ICD-10-CM | POA: Diagnosis not present

## 2023-01-26 DIAGNOSIS — K219 Gastro-esophageal reflux disease without esophagitis: Secondary | ICD-10-CM | POA: Diagnosis not present

## 2023-01-26 DIAGNOSIS — R110 Nausea: Secondary | ICD-10-CM

## 2023-01-26 DIAGNOSIS — M4310 Spondylolisthesis, site unspecified: Secondary | ICD-10-CM | POA: Insufficient documentation

## 2023-01-26 DIAGNOSIS — D693 Immune thrombocytopenic purpura: Secondary | ICD-10-CM

## 2023-01-26 DIAGNOSIS — R109 Unspecified abdominal pain: Secondary | ICD-10-CM | POA: Diagnosis not present

## 2023-01-26 DIAGNOSIS — D696 Thrombocytopenia, unspecified: Secondary | ICD-10-CM | POA: Diagnosis not present

## 2023-01-26 DIAGNOSIS — R112 Nausea with vomiting, unspecified: Secondary | ICD-10-CM | POA: Diagnosis not present

## 2023-01-26 DIAGNOSIS — K30 Functional dyspepsia: Secondary | ICD-10-CM

## 2023-01-26 DIAGNOSIS — K7210 Chronic hepatic failure without coma: Secondary | ICD-10-CM

## 2023-01-26 DIAGNOSIS — R519 Headache, unspecified: Secondary | ICD-10-CM | POA: Diagnosis not present

## 2023-01-26 DIAGNOSIS — D699 Hemorrhagic condition, unspecified: Secondary | ICD-10-CM

## 2023-01-26 DIAGNOSIS — I2510 Atherosclerotic heart disease of native coronary artery without angina pectoris: Secondary | ICD-10-CM | POA: Diagnosis not present

## 2023-01-26 DIAGNOSIS — K7581 Nonalcoholic steatohepatitis (NASH): Secondary | ICD-10-CM | POA: Diagnosis not present

## 2023-01-26 DIAGNOSIS — R9431 Abnormal electrocardiogram [ECG] [EKG]: Secondary | ICD-10-CM | POA: Diagnosis not present

## 2023-01-26 DIAGNOSIS — I10 Essential (primary) hypertension: Secondary | ICD-10-CM | POA: Diagnosis not present

## 2023-01-26 DIAGNOSIS — I440 Atrioventricular block, first degree: Secondary | ICD-10-CM | POA: Diagnosis not present

## 2023-01-26 DIAGNOSIS — M4316 Spondylolisthesis, lumbar region: Secondary | ICD-10-CM

## 2023-01-26 DIAGNOSIS — R933 Abnormal findings on diagnostic imaging of other parts of digestive tract: Secondary | ICD-10-CM

## 2023-01-26 DIAGNOSIS — E860 Dehydration: Secondary | ICD-10-CM | POA: Diagnosis not present

## 2023-01-26 DIAGNOSIS — R161 Splenomegaly, not elsewhere classified: Secondary | ICD-10-CM

## 2023-01-26 DIAGNOSIS — R748 Abnormal levels of other serum enzymes: Secondary | ICD-10-CM

## 2023-01-26 LAB — CBC AND DIFFERENTIAL
HCT: 44 (ref 41–53)
Hemoglobin: 14.8 (ref 13.5–17.5)
Neutrophils Absolute: 6.28
Platelets: 55 10*3/uL — AB (ref 150–400)
WBC: 7.3

## 2023-01-26 LAB — CMP (CANCER CENTER ONLY)
ALT: 71 U/L — ABNORMAL HIGH (ref 0–44)
AST: 52 U/L — ABNORMAL HIGH (ref 15–41)
Albumin: 3.2 g/dL — ABNORMAL LOW (ref 3.5–5.0)
Alkaline Phosphatase: 165 U/L — ABNORMAL HIGH (ref 38–126)
Anion gap: 10 (ref 5–15)
BUN: 39 mg/dL — ABNORMAL HIGH (ref 6–20)
CO2: 21 mmol/L — ABNORMAL LOW (ref 22–32)
Calcium: 9.4 mg/dL (ref 8.9–10.3)
Chloride: 110 mmol/L (ref 98–111)
Creatinine: 2.33 mg/dL — ABNORMAL HIGH (ref 0.61–1.24)
GFR, Estimated: 33 mL/min — ABNORMAL LOW (ref >60)
Glucose, Bld: 130 mg/dL — ABNORMAL HIGH (ref 70–99)
Potassium: 4.5 mmol/L (ref 3.5–5.1)
Sodium: 141 mmol/L (ref 135–145)
Total Bilirubin: 1.1 mg/dL (ref 0.3–1.2)
Total Protein: 7 g/dL (ref 6.5–8.1)

## 2023-01-26 LAB — CBC: RBC: 4.53 (ref 3.87–5.11)

## 2023-01-26 MED ORDER — ONDANSETRON 4 MG PO TBDP: 4.0000 mg | ORAL_TABLET | Freq: Three times a day (TID) | ORAL | 11 refills | Status: DC | PRN

## 2023-01-26 MED ORDER — ZEPBOUND 2.5 MG/0.5ML ~~LOC~~ SOAJ: 2.5000 mg | SUBCUTANEOUS | 3 refills | Status: DC

## 2023-01-26 NOTE — Assessment & Plan Note (Signed)
Sending in zepbound again This time went through and completed savings card- gave him printout and put the data on the escript This is secondary today due to incidental flare of gastrointestinal issue

## 2023-01-26 NOTE — Progress Notes (Signed)
[facility]  [address] [place],  [place]  [address] [phone]  Clinic Day:  07/24/2022  Referring physician: [name], PA   HISTORY OF PRESENT ILLNESS:  The patient is a 52 y.o. 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  His platelets have chronically held around 70k. He comes in today for routine follow-up.  Since his last visit, the patient has been doing fairly well.  Despite his low platelets, he denies having any bruising/bleeding issues which concern him for severe thrombocytopenia being present.  Of note, this patient is being followed by GI in [place].  He brings to my attention that an abdominal ultrasound was recently done.  The results of this study did show findings suspicious for fatty liver disease.  PHYSICAL EXAM:  There were no vitals taken for this visit. Wt Readings from Last 3 Encounters:  01/26/23 (!) 363 lb 12.8 oz (165 kg)  12/30/22 (!) 367 lb 12.8 oz (166.8 kg)  12/15/22 (!) 373 lb (169.2 kg)   There is no height or weight on file to calculate BMI. Performance status (ECOG): 1 - Symptomatic but completely ambulatory Physical Exam Constitutional:      Appearance: Normal appearance. He is obese. He is not ill-appearing.  HENT:     Mouth/Throat:     Mouth: Mucous membranes are moist.     Pharynx: Oropharynx is clear. No oropharyngeal exudate or posterior oropharyngeal erythema.  Cardiovascular:     Rate and Rhythm: Normal rate and regular rhythm.     Heart sounds: No murmur heard.    No friction rub. No gallop.  Pulmonary:     Effort: Pulmonary effort is normal. No respiratory distress.     Breath sounds: Normal breath sounds. No wheezing, rhonchi or rales.  Abdominal:     General: Bowel sounds are normal. There is no distension.     Palpations: Abdomen is soft. There is no mass.     Tenderness: There is no abdominal tenderness.  Musculoskeletal:        General: No swelling.      Right lower leg: No edema.     Left lower leg: No edema.  Lymphadenopathy:     Cervical: No cervical adenopathy.     Upper Body:     Right upper body: No supraclavicular or axillary adenopathy.     Left upper body: No supraclavicular or axillary adenopathy.     Lower Body: No right inguinal adenopathy. No left inguinal adenopathy.  Skin:    General: Skin is warm.     Coloration: Skin is not jaundiced.     Findings: No lesion or rash.  Neurological:     General: No focal deficit present.     Mental Status: He is alert and oriented to person, place, and time. Mental status is at baseline.  Psychiatric:        Mood and Affect: Mood normal.        Behavior: Behavior normal.        Thought Content: Thought content normal.    LABS:         Latest Ref Rng & Units 09/24/2022    3:40 PM 09/03/2022    3:45 PM 08/25/2022   12:21 PM  CBC  WBC 4.0 - 10.5 K/uL 5.9  4.7  6.8   Hemoglobin 13.0 - 17.0 g/dL 11.4  10.9  11.5   Hematocrit 39.0 - 52.0 % 33.3  32.5  33.3  Platelets 150.0 - 400.0 K/uL 62.0  84.0  94.0       Latest Ref Rng & Units 11/25/2022   12:00 AM 09/30/2022    2:51 PM 09/24/2022    3:40 PM  CMP  Glucose 70 - 99 mg/dL  92  146   BUN 6 - 23 mg/dL  27  24   Creatinine 0.40 - 1.50 mg/dL  2.51  2.66   Sodium 135 - 145 mEq/L  142  142   Potassium 3.5 - 5.1 mEq/L  4.1  4.3   Chloride 96 - 112 mEq/L  109  109   CO2 19 - 32 mEq/L  27  26   Calcium 8.4 - 10.5 mg/dL  9.6  8.7   Total Protein 6.0 - 8.3 g/dL  6.8  6.2   Total Bilirubin 0.2 - 1.2 mg/dL  1.2  1.3   Alkaline Phos 44 - 121 IU/L 205  161  156   AST 0 - 37 U/L  32  33   ALT 0 - 53 U/L  29  30     Latest Reference Range & Units 07/24/22 13:27  Iron 45 - 182 ug/dL 87  UIBC ug/dL 286  TIBC 250 - 450 ug/dL 373  Saturation Ratios 17.9 - 39.5 % 23  Ferritin 24 - 336 ng/mL 122    Latest Reference Range & Units 07/24/22 15:00  Folate >5.9 ng/mL 5.3 (L)  Vitamin B12 180 - 914 pg/mL 2,988 (H)  (L): Data is  abnormally low (H): Data is abnormally high  ASSESSMENT & PLAN:  Assessment/Plan:  A 52 y.o. male with thrombocytopenia.  His platelet count today is essentially the same as it has been over this past few years.  Although I have surmised that his thrombocytopenia is due to ITP, I am becoming increasingly concerned that underlying liver disease may also be factoring into his thrombocytopenia.  As mentioned previously, an abdominal ultrasound done a few months ago did show findings suspicious for steatohepatitis.  With respect to his liver health, the patient knows that it will be important to lose weight, as well as follow-up with his GI doctors to see if there is any type of therapy that could be used to offset the severity of his likely fatty liver disease.  On another note, his folic acid level is low.  Based upon this, he will need to take at least [id number] mcg of folic acid daily to replete his folate stores.  Otherwise, as his thrombocytopenia remains stable, I will see him back in 6 months for repeat clinical assessment.  The patient understands all the plans discussed today and is in agreement with them.     Nevaeha Finerty Macarthur Critchley, MD

## 2023-01-26 NOTE — Progress Notes (Unsigned)
Pt presents this afternoon for follow up.  Was seen by PCP @ 9:00 am for routine followup.  No vital signs were taken per patient and mother.  He was complaining of right sided chest discomfort that he described as "indigestion".  Patient was given two tums in clinic with PCP.  BPM were 144 in clinic with [place] this afternoon and patient did not feel well.  C/O headache and still having "indigestion".  Dr. Bobby Rumpf spoke with colleague of Dr. Rubye Beach (patient's cardiologist) who recommends he present to ED.  Katrina and Levada Dy transported patient to ED.

## 2023-01-26 NOTE — Assessment & Plan Note (Addendum)
Encouraged continuing with gastroenterologist [name]. Will monitor alk phos elevation Will continue to promote weight loss under premise this is Nonalcoholic Fatty Liver Disease (NAFLD)

## 2023-01-26 NOTE — Assessment & Plan Note (Addendum)
Flaring this am was fine yesterday. Gave 2 Tums and cup of water.   Explained esophagoduodenoscopy findings of past to him Encouraged him to see [name] soon Explained that he might need surgical correction Sharing this note with [name] Encouraged stay hydrated, emergency room if any blood due to also has inr 1.2 and low platelet(s) and liver disease.

## 2023-01-26 NOTE — Patient Instructions (Signed)
It was a pleasure seeing you today!  Your health and satisfaction are my top priorities. If you believe your experience today was worthy of a 5-star rating, I'd be grateful for your feedback! Loralee Pacas, MD   CHECKOUT CHECKLIST  '[]'$    Schedule next appointment(s):    Return in about 1 month (around 02/24/2023) for chronic disease monitoring and management.  Any requested lab visits should be scheduled as appointments too  If you are not doing well:  Return to the office sooner Please bring all your medicine bottles to each appointment If your condition begins to worsen or become severe:  go to the emergency room or even call 911  '[]'$   (Optional):  Review your clinical notes on MyChart after they are completed.     Today's draft of the physician documented plan for today's visit: (final revisions will be visible on MyChart chart later) Indigestion  Gastroesophageal reflux disease without esophagitis Assessment & Plan: Flaring this am was fine yesterday. Gave 2 Tums and cup of water.   Explained esophagoduodenoscopy findings of past to him Encouraged him to see [name] soon Explained that he might need surgical correction Sharing this note with [name] Encouraged stay hydrated, emergency room if any blood due to also has inr 1.2 and low platelet(s) and liver disease.  Orders: -     H. pylori antigen, stool  NASH (nonalcoholic steatohepatitis)  Morbid obesity ([place]) Assessment & Plan: Sending in zepbound again This time went through and completed savings card- gave him printout and put the data on the escript This is secondary today due to incidental flare of gastrointestinal issue  Orders: -     Zepbound; Inject 2.5 mg into the skin once a week.  Dispense: 0.5 mL; Refill: 3  Alkaline phosphatase elevation Assessment & Plan: Encouraged continuing with gastroenterologist [name]. Will monitor alk phos elevation Will continue to promote weight loss under premise  this is Nonalcoholic Fatty Liver Disease (NAFLD)    Nausea -     Ondansetron; Take 1 tablet (4 mg total) by mouth every 8 (eight) hours as needed for nausea or vomiting (for nausea from wegovy or other source).  Dispense: 30 tablet; Refill: 11  Chronic liver failure without hepatic coma (HCC)  Abnormal CT scan, gastrointestinal tract Assessment & Plan: Reviewed artificial intelligence analysis again, decided to add liver failure based on elevated bili, inr, low platelet(s)     Splenomegaly  Spondylolisthesis of lumbar region  Bleeding diathesis (HCC)  Thrombocytopenia, idiopathic (HCC)      QUESTIONS & CONCERNS: CLINICAL: please contact us via phone [phone] OR MyChart messaging  LAB & IMAGING:   We will call you if the results are significantly abnormal or you don't use MyChart.  Most normal results will be posted to MyChart immediately and have a clinical review message by Dr. Randol Kern posted within 2-3 business days.   If you have not heard from [place] regarding the results in 2 weeks OR if you need priority reporting, please contact this office. MYCHART:  The fastest way to get your results and easiest way to stay in touch with [place] is by activating your My Chart account. Instructions are located on the last page of this paperwork.  BILLING: xray and lab orders are billed from separate companies and questions./concerns should be directed to the [place].  For visit charges please discuss with our administrative services COMPLAINTS:  please let Dr. Randol Kern know or see the [facility] Administrator -  [facility], by asking at the front desk: we want you to be satisfied with every experience and we would be grateful for the opportunity to address any problems

## 2023-01-26 NOTE — Progress Notes (Signed)
[facility]an [name] [facility]: [id number]   Routine Medical Office Visit  Patient:  Sean [name]      Age: 52 y.o.       Sex:  male  Date:   01/26/2023  PCP:    Loralee Pacas, [place] Provider: Loralee Pacas, MD   Problem Focused Charting:   Medical Decision Making per Assessment/Plan   I'm concerned about Sean [name] today due to sudden severe dyspepsia despite maximum medical therapy, in the setting of bleeding diathesis, and recent history CT demonstrated duodenitis.  EGD recently done and CT difficult to consider worthwhile due to chronic stage 4 kidney disease.  Encouraged we get h.pylori rule out as soon as possible and go to ER if worsening.   Sean [name] was seen today for 1 month follow-up, stomach indigestion  and nausea.  Indigestion  Gastroesophageal reflux disease without esophagitis Overview: Following with [name] Uncontrolled despite 80 mg proton pump inhibitor (PPI) stomach acid reducer     Assessment & Plan: Flaring this am was fine yesterday. Gave 2 Tums and cup of water.   Explained esophagoduodenoscopy findings of past to him Encouraged him to see [name] soon Explained that he might need surgical correction Sharing this note with [name] Encouraged stay hydrated, emergency room if any blood due to also has inr 1.2 and low platelet(s) and liver disease.  Orders: -     H. pylori antigen, stool  NASH (nonalcoholic steatohepatitis) Overview: 01/30/14  OV:  Records from Dr. Melina Copa, and a CT scan which showed no significant abnormalities to his liver, and had multiple laboratory studies that came back non-revealing.  The patient was presumed by GI to be secondary to his cholesterol lowering medication.    2019 US Shows Fatty Liver by cardiolgy at [facility].  He does not drink and this has been classified as NASH   Morbid obesity ([place]) Overview: Wt Readings from Last 10 Encounters:  01/26/23 (!) 363 lb 12.8 oz (165 kg)   12/30/22 (!) 367 lb 12.8 oz (166.8 kg)  12/15/22 (!) 373 lb (169.2 kg)  12/08/22 (!) 388 lb 12.8 oz (176.4 kg)  11/25/22 (!) 384 lb 11.2 oz (174.5 kg)  11/13/22 (!) 384 lb 12.8 oz (174.5 kg)  10/13/22 (!) 385 lb (174.6 kg)  09/30/22 (!) 384 lb (174.2 kg)  09/24/22 (!) 382 lb (173.3 kg)  09/03/22 (!) 377 lb 3.2 oz (171.1 kg)   BMI Readings from Last 1 Encounters:  01/26/23 50.74 kg/m   No history bariatrics Complicated by  elevated alk phos status post cholecystecomy  Complicated by umbilical hernia Complicated by gout Complicated by coronary artery disease status post cabg Complicated by gastritis Complicated by chronic kidney disease 3-4 Complicated by obstructive sleep apnea  Complicated by aflutter  Assessment & Plan: Sending in zepbound again This time went through and completed savings card- gave him printout and put the data on the escript This is secondary today due to incidental flare of gastrointestinal issue  Orders: -     Zepbound; Inject 2.5 mg into the skin once a week.  Dispense: 0.5 mL; Refill: 3  Alkaline phosphatase elevation Overview: Lab Results  Component Value Date   ALKPHOS 165 (H) 01/26/2023   ALKPHOS 205 (H) 11/25/2022   ALKPHOS 161 (H) 09/30/2022   ALKPHOS 156 (H) 09/24/2022   ALKPHOS 369 (H) 09/03/2022   ALKPHOS 429 (H) 08/25/2022   ALKPHOS 202 (A) 07/24/2022   ALKPHOS 180 (H) 04/24/2022  ALKPHOS 192 (A) 03/24/2022   ALKPHOS 169 (H) 03/19/2022   Fractionated alk phos was normal Lab Results  Component Value Date   ALT 71 (H) 01/26/2023  History cholecystectomy ?Nonalcoholic Fatty Liver Disease (NAFLD)  2023: negative asma/antimitochondrial/ceruloplasmin/alpha1AT, positive Anti Nuclear Antigen (ANA) 1:40 speckled, homogenous Negative acute hepatitis panel mid 2023 08/2022 ultrasound  Liver: No focal lesion identified. Increased parenchymal echogenicity. Portal vein is patent on color Doppler maging with normal direction of blood flow  towards the liver. 01/2021 MRI abdomen: Splenomegaly measuring up to 15 cm in maximal craniocaudal dimension.   Assessment & Plan: Encouraged continuing with gastroenterologist [name]. Will monitor alk phos elevation Will continue to promote weight loss under premise this is Nonalcoholic Fatty Liver Disease (NAFLD)    Nausea -     Ondansetron; Take 1 tablet (4 mg total) by mouth every 8 (eight) hours as needed for nausea or vomiting (for nausea from wegovy or other source).  Dispense: 30 tablet; Refill: 11  Chronic liver failure without hepatic coma (HCC) Overview: Difficult to confirm but suspect(s) mild iver failure based on elevated bili, inr, low platelet(s), large spleen, chronically elevated LFT Attempts to get elastography were low quality, and claustrophobia prevented mri.  CT noncontrasted due to chronic kidney disease has minimal value in evaluating.     Abnormal CT scan, gastrointestinal tract Overview: Per gastrointestinal consultation 10/2022: He has been under evaluation for nausea, early morning vomiting, and epigastric pain.  He will occasionally have central, lower abdominal pain. Symptoms have been present for over 30 years. He stated waking up sick every morning for as long as he can remember, since childhood.  He awakens in the morning with nausea and vomits.  However, for the past 4 to 5 years he vomits up a small amount bright red blood with clear phlegm most mornings. He showed me a photo from his phone of blood and clear phlegm he recently vomited into the bathtub. Evaluation has included EGD and abdominal ultrasound.   Longstanding history of GERD treated the best with esomeprazole.  Did not find pantoprazole as effective. Frequently sleeps elevated in a recliner. He is taking Famotidine '20mg'$  QD. He is not taking Pantoprazole '40mg'$  bid as prescribed as it was not covered by his insurance, instead he is taking another PPI otc once or twice daly (he cannot recall  the name of which PPI he is taking).    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    Endoscopic history: - EGD 01/2021 with [name] which showed gastric intestinal metaplasia with a normal esophagus.  No evidence of esophageal or gastric varices.   - Colonoscopy 01/3021 which was incomplete, one tubular adenomatous polyp was removed from the colon - CT colonography 04/22/21 showed no polyp or mass. He did not tolerate the imaging study well and vowed to never do it again.   Elastography 01/15/21 showed an echogenic liver, Median kPa: 3.7; however, the study was limited in accuracy due to body habitus.  CT [facility] 08/28/2022 to evaluate the abdominal pain: Mild wall thickening of the proximal duodenum with periduodenal stranding which may represent duodenitis Abdominal ultrasound 09/23/22 showed fatty liver Labs 09/24/2022: Creatinine 2.66, glucose 146, sodium 142, albumin 3.2, total bilirubin 1.3, AST 33, ALT 30, alk phos 156, hemoglobin 11.4, MCV 100.1, RDW 16.8, platelets 62 Last INR 04/24/2022 was 1.2  Assessment & Plan: Reviewed artificial intelligence analysis  again, decided to add liver failure based on elevated bili, inr, low platelet(s)     Splenomegaly Overview: 01/2021 MRI abdomen: Splenomegaly measuring up to 15 cm in maximal craniocaudal dimension. Associated with low platelets.   Spondylolisthesis of lumbar region Overview: There is a grade 1 anterior spondylolisthesis of L4 and L5 with bilateral spondylolysis on Computerized Tomographic imaging of Abdomen and Pelvis (CTAP) 08/2022 from [facility]   Bleeding diathesis ([place]) Overview: Chronically low platelets, suspect liver disease,and takes eliquis.   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 He has associated  splenomegaly. Lab Results  Component Value Date/Time   PLT 62.0 (L) 09/24/2022 03:40 PM   PLT 84.0 (L) 09/03/2022 03:45 PM   PLT 94.0 (L) 08/25/2022 12:21 PM   PLT 75 (A) 07/24/2022 12:00 AM   PLT 52.0 Repeated and verified X2. (L) 04/24/2022 02:39 PM   PLT 58 (LL) 10/31/2020 09:58 AM   PLT 50 (LL) 08/13/2020 09:09 AM   PLT 79 (LL) 09/14/2019 01:50 PM   PLT 89 (LL) 12/08/2018 04:38 PM   PLT 83 (LL) 08/21/2017 12:33 PM            Subjective - Clinical Presentation:   Sean [name] is a 52 y.o. male  Patient Active Problem List   Diagnosis Date Noted   Chronic liver failure without hepatic coma ([place]) 01/26/2023   Splenomegaly 01/26/2023   Spondylolisthesis 01/26/2023   Bleeding diathesis ([place]) 01/26/2023   Abnormal CT scan, gastrointestinal tract 11/25/2022   Gastric polyps 11/25/2022   Gastritis and gastroduodenitis 11/25/2022   Nausea and vomiting 11/25/2022   Chronic venous stasis dermatitis of both lower extremities 10/13/2022   Chronic venous stasis 10/13/2022   Statins contraindicated 11/01/2021   Hypothyroid 11/01/2021   Herpes simplex 04/04/2021   Ectatic aorta ([place]) 06/15/2020   Gastroesophageal reflux disease without esophagitis 09/05/2019   Alkaline phosphatase elevation 01/23/2017   NASH (nonalcoholic steatohepatitis) 01/23/2017   Chronic gout of multiple sites 01/20/2017   Prediabetes 01/20/2017   Morbid obesity ([place])    Other long term (current) drug therapy 02/19/2016   Obstructive sleep apnea syndrome 02/19/2016   Atrial flutter with rapid ventricular response ([place]) 02/12/2016   Chronic kidney disease, stage 4 (severe) ([place]) 01/04/2016   History of coronary artery bypass surgery 12/26/2015   CAD (coronary artery disease)    Essential hypertension 12/23/2015   Thrombocytopenia, 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 Sees a doctor in [place].   Cardiac cirrhosis    a. possible elevated LFTs/low platelets felt due to cardiac cirrhosis per 2017 admission.   Chronic combined systolic and diastolic CHF (congestive heart failure) (HCC)    CKD (chronic kidney disease), stage II    Stage 4   Congenital heart defect    a. rightward rotation of heart, almost dextrocardia   Coronary artery disease    a. s/p CABGx1 in 12/2015.   Hematuria    a. Chronic hx of this, no prior etiology determined through workup per patient.   History of umbilical hernia repair [id number]   History of hernia surgery twice prior   Hypercholesteremia    a. Prev taken off statin due to abnormal liver function.   Hypertension    Incisional hernia 07/10/2011   History of hernia surgery twice prior  Morbid obesity ([place])    Morbid obesity with BMI of 50.0-59.9, adult ([place]) 01/04/2016   After [place] [facility] doctor is setting him up with a wellness clinic to help with weight losS  He reports not having tried anything for weight loss either diet or or medicine or surgery in the past  We failed to get [facility] 10/2022      Wt Readings from Last 10 Encounters:  11/13/22  (!) 384 lb 12.8 oz (174.5 kg)  10/13/22  (!) 385 lb (174.6 kg)  09/30/22  (!) 384 lb (174.2 kg)  09/24/22  (!) 382 lb (1   Nausea and vomiting 11/25/2022   OSA on CPAP    Paroxysmal atrial flutter (HCC) 02/12/2016   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   LIMA to LAD   Sinus bradycardia    Thrombocytopenia (HCC)     Outpatient Medications Prior to Visit  Medication Sig   acetaminophen (TYLENOL) 500 MG tablet Take 500-1,500 mg by mouth as needed for moderate pain, fever, headache or mild pain.   allopurinol (ZYLOPRIM) 100 MG tablet TAKE 1 TABLET(100 MG) BY MOUTH DAILY   amitriptyline (ELAVIL) 25 MG tablet Take 1 tablet (25 mg total) by mouth at bedtime.   apixaban (ELIQUIS) 5 MG TABS tablet Take 1 tablet (5 mg total) by mouth 2 (two) times daily.   azelastine  (ASTELIN) 0.1 % nasal spray Place 2 sprays into both nostrils 2 (two) times daily. (Patient taking differently: Place 2 sprays into both nostrils 2 (two) times daily as needed for allergies.)   Cyanocobalamin (VITAMIN B-12) 5000 MCG SUBL Place 5,000 mcg under the tongue daily.   diltiazem (CARDIZEM SR) 120 MG 12 hr capsule TAKE 1 CAPSULE(120 MG) BY MOUTH TWICE DAILY   empagliflozin (JARDIANCE) 10 MG TABS tablet Take 1 tablet (10 mg total) by mouth daily before breakfast.   Evolocumab (REPATHA SURECLICK) [id number] MG/ML SOAJ INJECT THE CONTENTS OF 1 SYRINGE UNDER THE SKIN EVERY 14 DAYS   famotidine (PEPCID) 20 MG tablet TAKE 1 TABLET(20 MG) BY MOUTH TWICE DAILY   levothyroxine (SYNTHROID) 50 MCG tablet TAKE 1 TABLET(50 MCG) BY MOUTH DAILY BEFORE BREAKFAST   losartan (COZAAR) 25 MG tablet Take 1 tablet (25 mg total) by mouth daily.   metoprolol succinate (TOPROL-XL) 25 MG 24 hr tablet TAKE 1 TABLET(25 MG) BY MOUTH DAILY   montelukast (SINGULAIR) 10 MG tablet TAKE 1 TABLET(10 MG) BY MOUTH DAILY   nitroGLYCERIN (NITROSTAT) 0.4 MG SL tablet Place 1 tablet (0.4 mg total) under the tongue every 5 (five) minutes as needed for chest pain.   pantoprazole (PROTONIX) 40 MG tablet TAKE 1 TABLET(40 MG) BY MOUTH TWICE DAILY   polyethylene glycol powder (GLYCOLAX/MIRALAX) 17 GM/SCOOP powder Take 1 dose daily. May increase to BID if needed (Patient taking differently: Take 17 g by mouth daily as needed for moderate constipation.)   potassium chloride (KLOR-CON M) 10 MEQ tablet TAKE 1 TABLET(10 MEQ) BY MOUTH DAILY   sucralfate (CARAFATE) 1 g tablet TAKE 1 TABLET(1 GRAM) BY MOUTH TWICE DAILY. MAY INCREASE TO 4 TIMES DAILY FOR ANY ACUTE SYMPTOMS   [DISCONTINUED] tirzepatide (ZEPBOUND) 2.5 MG/0.5ML Pen Inject 2.5 mg into the skin once a week.   docusate sodium (STOOL SOFTENER) 100 MG capsule Take 1 capsule (100 mg total) by mouth daily. (Patient not taking: Reported on 01/26/2023)   valACYclovir (VALTREX) 500 MG tablet Take  500 mg by mouth 2 (two) times daily. (Patient not taking: Reported on 01/26/2023)   valACYclovir (VALTREX)  500 MG tablet Take 1 tablet (500 mg total) by mouth daily. (Patient not taking: Reported on 01/26/2023)   No facility-administered medications prior to visit.    Chief Complaint  Patient presents with   1 month follow-up   Stomach indigestion     Started this morning with excessive burping.   Nausea    Has not ate today, last time he ate was around 2 or 3 pm yesterday.    HPI   Woke up this am severe indigestion, despite taking twice daily proton pump inhibitor (PPI) stomach acid reducer Pain along top of abdomen and lots of burping Associated with had sugar free ice cream last night and oj this am Recent review of gastrointestinal record(s) done  Last CT abdomen showed duodenitis Last esophagoduodenoscopy showed gastritis and intest metaplasia Gastroenterologist is currently unavailable  He has been losing weight with diet and exercise, couldn't get zepbound         Objective:  Physical Exam  Temp 98.5 F (36.9 C) (Temporal)   Ht '5\' 11"'$  (1.803 m)   Wt (!) 363 lb 12.8 oz (165 kg)   BMI 50.74 kg/m  Severely obese  by BMI criteria but truncal adiposity (waist circumference or caliper) should be used instead. Wt Readings from Last 10 Encounters:  01/26/23 (!) 363 lb 14.4 oz (165.1 kg)  01/26/23 (!) 363 lb 12.8 oz (165 kg)  12/30/22 (!) 367 lb 12.8 oz (166.8 kg)  12/15/22 (!) 373 lb (169.2 kg)  12/08/22 (!) 388 lb 12.8 oz (176.4 kg)  11/25/22 (!) 384 lb 11.2 oz (174.5 kg)  11/13/22 (!) 384 lb 12.8 oz (174.5 kg)  10/13/22 (!) 385 lb (174.6 kg)  09/30/22 (!) 384 lb (174.2 kg)  09/24/22 (!) 382 lb (173.3 kg)   Vital signs reviewed.  Nursing notes reviewed. Weight trend reviewed. General Appearance:  Well developed, well nourished male in no acute distress.   Normal work of breathing at rest Musculoskeletal: All extremities are intact.  Neurological:  Awake, alert,  No  obvious focal neurological deficits or cognitive impairments Psychiatric:  Appropriate mood, pleasant demeanor Problem-specific findings:  looks uncomfortable, burping a lot   Results Reviewed: Results for orders placed or performed in visit on 01/26/23  CMP ([place] only)  Result Value Ref Range   Sodium 141 135 - 145 mmol/L   Potassium 4.5 3.5 - 5.1 mmol/L   Chloride 110 98 - 111 mmol/L   CO2 21 (L) 22 - 32 mmol/L   Glucose, Bld 130 (H) 70 - 99 mg/dL   BUN 39 (H) 6 - 20 mg/dL   Creatinine 2.33 (H) 0.61 - 1.24 mg/dL   Calcium 9.4 8.9 - 10.3 mg/dL   Total Protein 7.0 6.5 - 8.1 g/dL   Albumin 3.2 (L) 3.5 - 5.0 g/dL   AST 52 (H) 15 - 41 U/L   ALT 71 (H) 0 - 44 U/L   Alkaline Phosphatase 165 (H) 38 - 126 U/L   Total Bilirubin 1.1 0.3 - 1.2 mg/dL   GFR, Estimated 33 (L) >60 mL/min   Anion gap 10 5 - 15    Recent Results (from the past 2160 hour(s))  Alkaline phosphatase, isoenzymes     Status: Abnormal   Collection Time: 11/25/22 12:00 AM  Result Value Ref Range   Alkaline Phosphatase 205 (H) 44 - 121 IU/L   LIVER FRACTION 59 13 - 88 %   BONE FRACTION 35 12 - 68 %   INTESTINAL FRAC. 6 0 -  18 %  Glucose, capillary     Status: None   Collection Time: 11/25/22  7:04 AM  Result Value Ref Range   Glucose-Capillary 90 70 - 99 mg/dL    Comment: Glucose reference range applies only to samples taken after fasting for at least 8 hours.  Surgical pathology     Status: None   Collection Time: 11/25/22  7:59 AM  Result Value Ref Range   SURGICAL PATHOLOGY      SURGICAL PATHOLOGY CASE: [id number] PATIENT: Grover Canavan Surgical Pathology Report     Clinical History: Abd pain, abnormal CT of abdomen, nausea, vomiting, cirrhosis ([place])     FINAL MICROSCOPIC DIAGNOSIS:  A. DUODENUM, BIOPSY: - Duodenal mucosa with prominent mucosal lymphocytic aggregates.  No intraepithelial lymphocytes, villous atrophy or malignancy.  No granulomas identified. - Helicobacter pylori  immunostaining negative.  B. STOMACH, POLYPECTOMY: - Hyperplastic polyp.  No intestinal metaplasia, dysplasia or malignancy.  C. STOMACH, ANTRUM, LESSER CURVE, BIOPSY: - Reactive gastropathy. - Intestinal metaplasia.  No dysplasia or malignancy.  D. STOMACH, ANTRUM, GREATER CURVE, BIOPSY: - Reactive gastropathy.  No intestinal metaplasia, dysplasia or malignancy. - Focal chronic gastritis.  E. STOMACH, BODY, BIOPSY: - Gastric mucosa with patchy chronic gastritis.  Helicobacter pylori immunostaining negative. - No intestinal metaplasia, dysplasi a or malignancy.  F. STOMACH, INCISURA, BIOPSY: - Focal chronic gastritis.  Helicobacter pylori immunostaining negative. - No intestinal metaplasia, dysplasia or malignancy.  G. STOMACH, FUNDUS, BIOPSY: - Patchy chronic gastritis.  Helicobacter pylori immunostaining negative. - No intestinal metaplasia, dysplasia or malignancy.  GROSS DESCRIPTION:  A: Received in formalin are tan, soft tissue fragments that are submitted in toto. Number: Multiple size: Range from 0.3 to 0.4 cm blocks: 1  B: Received in formalin are tan, soft tissue fragments that are submitted in toto. Number: 4 size: Range from 0.1 to 0.5 cm blocks: 1  C: Received in formalin are tan, soft tissue fragments that are submitted in toto. Number: 3 size: Range from 0.1 to 0.5 cm blocks: 1  D: Received in formalin are tan, soft tissue fragments that are submitted in toto. Number: 2 size: 0.3 and 0.5 cm blocks: 1  E: Received in formalin are tan, soft tissue fragments that are submitted in toto. Nu mber: 2 size: 0.4 and 0.5 cm blocks: 1  F: Received in formalin are tan, soft tissue fragments that are submitted in toto. Number: 3 size: Range from 0.2 to 0.3 cm blocks: 1  G: Received in formalin are tan, soft tissue fragments that are submitted in toto. Number: 3 size: Range from 0.1 to 0.3 cm blocks: 1 Craig Staggers 11/25/2022)   Final Diagnosis performed by Mark Martinique,  MD.   Electronically signed 11/28/2022 Technical component performed at [facility], [place] [address], [place], [place] [address].  Professional component performed at [employer]. [facility], [place] [address], [place], [place] [address].  Immunohistochemistry Technical component (if applicable) was performed at [facility]. [address] [place], [place], [place] [address].   IMMUNOHISTOCHEMISTRY DISCLAIMER (if applicable): Some of these immunohistochemical stains may have been developed and the performance characteristics determine by [facility].  Some may not have been cleared or approved by the U.S. Food and Drug Administration. The FDA has determined that such clearance or approval is not necessary. This test is used for clinical purposes. It should not be regarded as investigational or for research. This laboratory is certified under the [place] (CLIA-88) as  qualified to perform high complexity clinical laboratory testing.  The controls stained appropriately.   CMP ([place] only)     Status: Abnormal   Collection Time: 01/26/23  4:17 PM  Result Value Ref Range   Sodium 141 135 - 145 mmol/L   Potassium 4.5 3.5 - 5.1 mmol/L   Chloride 110 98 - 111 mmol/L   CO2 21 (L) 22 - 32 mmol/L   Glucose, Bld 130 (H) 70 - 99 mg/dL    Comment: Glucose reference range applies only to samples taken after fasting for at least 8 hours.   BUN 39 (H) 6 - 20 mg/dL   Creatinine 2.33 (H) 0.61 - 1.24 mg/dL   Calcium 9.4 8.9 - 10.3 mg/dL   Total Protein 7.0 6.5 - 8.1 g/dL   Albumin 3.2 (L) 3.5 - 5.0 g/dL   AST 52 (H) 15 - 41 U/L   ALT 71 (H) 0 - 44 U/L   Alkaline Phosphatase 165 (H) 38 - 126 U/L   Total Bilirubin 1.1 0.3 - 1.2 mg/dL   GFR, Estimated 33 (L) >60 mL/min    Comment: (NOTE) Calculated using the CKD-EPI Creatinine Equation (2021)    Anion gap 10 5 - 15    Comment: Performed at  [facility], [place] [address], [place], [place] [address]          Signed: Loralee Pacas, MD 01/26/2023 8:22 PM

## 2023-01-26 NOTE — Assessment & Plan Note (Signed)
Reviewed artificial intelligence analysis again, decided to add liver failure based on elevated bili, inr, low platelet(s)

## 2023-01-27 ENCOUNTER — Telehealth: Payer: Self-pay

## 2023-01-27 NOTE — Telephone Encounter (Signed)
Dr Tarri Glenn the pt has an appointment already scheduled with you previously on 4/11 is that ok?

## 2023-01-27 NOTE — Telephone Encounter (Signed)
-----   Message from Thornton Park, MD sent at 01/27/2023  4:30 PM EST ----- Please schedule follow-up with any APP.  Thanks.  KLB ----- Message ----- From: Loralee Pacas, MD Sent: 01/26/2023  12:49 PM EST To: Thornton Park, MD  He having bad flares dyspepsia despite max medical therapy.

## 2023-01-29 DIAGNOSIS — G4733 Obstructive sleep apnea (adult) (pediatric): Secondary | ICD-10-CM | POA: Diagnosis not present

## 2023-01-29 DIAGNOSIS — J301 Allergic rhinitis due to pollen: Secondary | ICD-10-CM | POA: Diagnosis not present

## 2023-01-29 NOTE — Telephone Encounter (Signed)
The pt has been rescheduled to 02/17/23 at 130 with [place].  The pt has been advised

## 2023-02-09 ENCOUNTER — Other Ambulatory Visit: Payer: Self-pay | Admitting: Internal Medicine

## 2023-02-09 DIAGNOSIS — I4892 Unspecified atrial flutter: Secondary | ICD-10-CM

## 2023-02-17 ENCOUNTER — Ambulatory Visit: Payer: BC Managed Care – PPO | Admitting: Nurse Practitioner

## 2023-02-17 ENCOUNTER — Encounter: Payer: Self-pay | Admitting: Nurse Practitioner

## 2023-02-17 VITALS — BP 130/70 | HR 76 | Ht 71.0 in | Wt 362.0 lb

## 2023-02-17 DIAGNOSIS — R112 Nausea with vomiting, unspecified: Secondary | ICD-10-CM

## 2023-02-17 NOTE — Patient Instructions (Signed)
Follow up as needed.  Continue Pantoprazole twice daily.  Continue Famotidine 20 mg twice daily.  Follow up with [facility].  Thank you for trusting me with your gastrointestinal care!   Carl Best, CRNP

## 2023-02-17 NOTE — Progress Notes (Unsigned)
02/17/2023 Sean [name] [place] [id number] Mar 31, 1971   Chief Complaint:  History of Present Illness:  Sean [name] is a 52 year old male with a past medical history of morbid obesity, hypertension, hyperlipidemia, diastolic and systolic CHF, coronary artery disease s/p 1 vessel CABG 2017, paroxysmal atrial fibrillation on Eliquis, CKD, OSA uses CPAP, hepatic steatosis, thrombocytopenia and tubular adenomatous colon polyps   He had bad indigestion on 01/27/2023. He ate shrimp salad fried clam strips Sunday. Next day. He vomited phlegm. Streaks of blood. He was seen by his PCP and he was precribed TUMS.  Dr. Bobby Rumpf in [place]. HR 140 b/min.  [facility] ED, EKG was ok, IV fluids CTAP and chest xray.    [facility] [address] 09/30/2022 after he was started on Jardiance   COMPARISON: Apr 22, 2021  FINDINGS: Lower chest: Multiple sternal wires are present.  There is chronic elevation of the left hemidiaphragm with mild atelectasis seen within the posterior aspect of the left lung base.  Hepatobiliary: Liver parenchyma is heterogeneous in appearance. No focal liver abnormality is seen. Status post cholecystectomy. No biliary dilatation.  Pancreas: Unremarkable. No pancreatic ductal dilatation or surrounding inflammatory changes.  Spleen: Normal in size without focal abnormality.  Adrenals/Urinary Tract: Adrenal glands are unremarkable. Kidneys are normal, without renal calculi, focal lesion, or hydronephrosis. Bladder is unremarkable.  Stomach/Bowel: Stomach is within normal limits. The appendix is surgically absent. No evidence of bowel wall thickening, distention, or inflammatory changes.  Vascular/Lymphatic: Aortic atherosclerosis. No enlarged abdominal or pelvic lymph nodes.  Reproductive: An atrophic prostate gland is suspected.  Other: A stable 4.3 cm x 3.7 cm x 3.8 cm cystic appearing area (approximately 30.22 Hounsfield units) is seen within the periumbilical  region on the left.  No abdominopelvic ascites.  Musculoskeletal: There is grade 1 anterolisthesis of the L4 vertebral body on L5 with marked severity degenerative changes noted within the lower lumbar spine.  IMPRESSION: 1. No acute or active process within the abdomen or pelvis. 2. Chronic elevation of the left hemidiaphragm with mild posterior left basilar atelectasis. 3. Evidence of prior cholecystectomy. 4. Grade 1 anterolisthesis of the L4 vertebral body on L5 with marked severity degenerative changes noted within the lower lumbar spine. 5. Aortic atherosclerosis.  Aortic Atherosclerosis  GI PROCEDURES:  EGD 11/25/2022: - Normal esophagus.  - Three gastric polyps. Some spontaneous oozing. Biopsied.  - Abnormal gastric mucosa with both erythema and loss of vascular pattern. Biopsied.  - No evidence for portal hypertensive gastropathy, esophageal varices, or gastric varices.  - Erythematous duodenopathy. Biopsied. A. DUODENUM, BIOPSY: - Duodenal mucosa with prominent mucosal lymphocytic aggregates.  No intraepithelial lymphocytes, villous atrophy or malignancy.  No granulomas identified. - Helicobacter pylori immunostaining negative.  B. STOMACH, POLYPECTOMY: - Hyperplastic polyp.  No intestinal metaplasia, dysplasia or malignancy.  C. STOMACH, ANTRUM, LESSER CURVE, BIOPSY: - Reactive gastropathy. - Intestinal metaplasia.  No dysplasia or malignancy.  D. STOMACH, ANTRUM, GREATER CURVE, BIOPSY: - Reactive gastropathy.  No intestinal metaplasia, dysplasia or malignancy. - Focal chronic gastritis.  E. STOMACH, BODY, BIOPSY: - Gastric mucosa with patchy chronic gastritis.  Helicobacter pylori immunostaining negative. - No intestinal metaplasia, dysplasia or malignancy.  F. STOMACH, INCISURA, BIOPSY: - Focal chronic gastritis.  Helicobacter pylori immunostaining negative. - No intestinal metaplasia, dysplasia or malignancy.  G. STOMACH, FUNDUS, BIOPSY: - Patchy  chronic gastritis.  Helicobacter pylori immunostaining negative. - No intestinal metaplasia, dysplasia or malignancy.    EGD 02/19/2021: - Normal esophagus. Biopsied. - Erosive gastropathy with  no bleeding and no stigmata of recent bleeding. Biopsied. - One gastric polyp. Biopsied. - Erythematous duodenopathy. Biopsied. - EGD in 3 years to monitor intestinal metaplasia, earlier with a family history of gastric cancer   Colonoscopy 02/19/2021: The perianal and digital rectal examinations were normal. A 3 mm polyp was found in the transverse colon. The polyp was sessile. The polyp was removed with a cold snare. Resection and retrieval were complete. Estimated blood loss was minimal. The proximal colon and cecum were not evaluated. The exam was otherwise without abnormality on direct and retroflexion views. - One 3 mm polyp in the transverse colon, removed with a cold snare. Resected and Short interval colonoscopy depending on pathology results. - Consider CT colonography given the incompletely evaluated right colon.   A. DUODENUM, BIOPSY:  -  Benign duodenal mucosa with focal villous blunting  -  No acute inflammation or increased intraepithelial lymphocytes  identified   B. GASTRIC, RANDOM, FUNDUS, ANTRUM, BODY, BIOPSY:  -  Reactive gastropathy with intestinal metaplasia and patchy chronic  inflammation  -  Focal features consistent with proton pump inhibitor effect  -  No H. pylori identified  -  See comment   C. GASTRIC, POLYPECTOMY:  -  Hyperplastic gastric polyp(s)  -  No H. pylori, intestinal metaplasia or malignancy identified   D. ESOPHAGUS, DISTAL, BIOPSY:  -  Benign squamous mucosa  -  No increased intraepithelial eosinophils   E. ESOPHAGUS, MID PROXIMAL, BIOPSY:  -  Benign squamous mucosa  -  No increased intraepithelial eosinophils   F. COLON, TRANSVERSE, POLYPECTOMY:  -  Tubular adenoma (1 of 1 fragments)  -  No high-grade dysplasia or malignancy identified      Current Medications, Allergies, Past Medical History, Past Surgical History, Family History and Social History were reviewed in [employer] record.   Review of Systems:   Constitutional: Negative for fever, sweats, chills or weight loss.  Respiratory: Negative for shortness of breath.   Cardiovascular: Negative for chest pain, palpitations and leg swelling.  Gastrointestinal: See HPI.  Musculoskeletal: Negative for back pain or muscle aches.  Neurological: Negative for dizziness, headaches or paresthesias.    Physical Exam: BP 130/70   Pulse 76   Ht 5\' 11"  (1.803 m)   Wt (!) 362 lb (164.2 kg)   BMI 50.49 kg/m    Wt Readings from Last 3 Encounters:  02/17/23 (!) 362 lb (164.2 kg)  01/26/23 (!) 363 lb 14.4 oz (165.1 kg)  01/26/23 (!) 363 lb 12.8 oz (165 kg)    General: in no acute distress. Head: Normocephalic and atraumatic. Eyes: No scleral icterus. Conjunctiva pink . Ears: Normal auditory acuity. Mouth: Dentition intact. No ulcers or lesions.  Lungs: Clear throughout to auscultation. Heart: Regular rate and rhythm, no murmur. Abdomen: Soft, nontender and nondistended. No masses or hepatomegaly. Normal bowel sounds x 4 quadrants.  Rectal: *** Musculoskeletal: Symmetrical with no gross deformities. Extremities: No edema. Neurological: Alert oriented x 4. No focal deficits.  Psychological: Alert and cooperative. Normal mood and affect  Assessment and Recommendations: ***

## 2023-02-23 ENCOUNTER — Ambulatory Visit: Payer: BC Managed Care – PPO | Admitting: Internal Medicine

## 2023-03-02 ENCOUNTER — Ambulatory Visit (INDEPENDENT_AMBULATORY_CARE_PROVIDER_SITE_OTHER): Payer: BC Managed Care – PPO | Admitting: Internal Medicine

## 2023-03-02 ENCOUNTER — Encounter: Payer: Self-pay | Admitting: Internal Medicine

## 2023-03-02 DIAGNOSIS — N1832 Chronic kidney disease, stage 3b: Secondary | ICD-10-CM | POA: Diagnosis not present

## 2023-03-02 DIAGNOSIS — D693 Immune thrombocytopenic purpura: Secondary | ICD-10-CM | POA: Diagnosis not present

## 2023-03-02 DIAGNOSIS — K7210 Chronic hepatic failure without coma: Secondary | ICD-10-CM

## 2023-03-02 DIAGNOSIS — T43015A Adverse effect of tricyclic antidepressants, initial encounter: Secondary | ICD-10-CM | POA: Insufficient documentation

## 2023-03-02 DIAGNOSIS — R768 Other specified abnormal immunological findings in serum: Secondary | ICD-10-CM | POA: Diagnosis not present

## 2023-03-02 DIAGNOSIS — D631 Anemia in chronic kidney disease: Secondary | ICD-10-CM | POA: Diagnosis not present

## 2023-03-02 DIAGNOSIS — I129 Hypertensive chronic kidney disease with stage 1 through stage 4 chronic kidney disease, or unspecified chronic kidney disease: Secondary | ICD-10-CM | POA: Diagnosis not present

## 2023-03-02 DIAGNOSIS — T43015S Adverse effect of tricyclic antidepressants, sequela: Secondary | ICD-10-CM | POA: Diagnosis not present

## 2023-03-02 DIAGNOSIS — N184 Chronic kidney disease, stage 4 (severe): Secondary | ICD-10-CM | POA: Diagnosis not present

## 2023-03-02 LAB — LAB REPORT - SCANNED
Albumin, Urine POC: 889.1
Creatinine, POC: 95.3 mg/dL
EGFR: 27
Microalb Creat Ratio: 933
Protein/Creatinine Ratio: 1735

## 2023-03-02 MED ORDER — ZEPBOUND 5 MG/0.5ML ~~LOC~~ SOAJ
5.0000 mg | SUBCUTANEOUS | 5 refills | Status: DC
Start: 2023-03-02 — End: 2023-05-04

## 2023-03-02 NOTE — Assessment & Plan Note (Signed)
He meant that he always wear his CPAP which she already does but there is especially important when taking amitriptyline.  I also advised him there are many interactions with this medication.  At this time he feels it helps him to wake up without feeling sick in the morning so I suggested may be just cutting it out for a day and seeing if he can get by without it now that his stomach seems to have gotten better after antibiotics last month.  If he is unable to stop it without waking up feeling sick then return to just taking it every day it is 1 to make sure that it is medically necessary before we stay on it

## 2023-03-02 NOTE — Assessment & Plan Note (Signed)
Reviewed situation- offered labs but he just got this done earlier today at [facility] The need for ongoing follow-up with the specialist and continued adherence to their recommendations was discussed with Sean [name].

## 2023-03-02 NOTE — Progress Notes (Signed)
[name]: [id number]   Routine Medical Office Visit  Patient:  Sean [name]      Age: 52 y.o.       Sex:  male  Date:   03/02/2023  PCP:    Loralee Pacas, [place] Provider: Loralee Pacas, MD   Assessment and Plan:   Today is close follow up for complex problems He didn't get vitals checked last visit and was sent to emergency room after found to be 130, but we don't have those record(s) to follow up on.  We apologized for this oversight and requested records for follow up Since we didn't have records, and stomach and heart rate doing much better, we focused on comprehensive review of medications and problems - and this resulted in review of positive Anti Nuclear Antigen (ANA), hepatitis and chronic stage 4 kidney disease being considered as a possible lupus scenario  Morbid obesity Assessment & Plan: Zepbound working good but we need to boost dose Encouraged continuing with diet and exercise   Orders: -     Zepbound; Inject 5 mg into the skin once a week.  Dispense: 2 mL; Refill: 5  Adverse effect of amitriptyline, sequela Assessment & Plan: He meant that he always wear his CPAP which she already does but there is especially important when taking amitriptyline.  I also advised him there are many interactions with this medication.  At this time he feels it helps him to wake up without feeling sick in the morning so I suggested may be just cutting it out for a day and seeing if he can get by without it now that his stomach seems to have gotten better after antibiotics last month.  If he is unable to stop it without waking up feeling sick then return to just taking it every day it is 1 to make sure that it is medically necessary before we stay on it   Chronic kidney disease, stage 4 (severe) Assessment & Plan: Reviewed situation- offered labs but he just got this done earlier today at [facility] The need for  ongoing follow-up with the specialist and continued adherence to their recommendations was discussed with Sean [name].    ANA positive -     Ambulatory referral to Rheumatology  Chronic liver failure without hepatic coma       Clinical Presentation:   52 y.o. male here today for 1 month follow-up Prior concern(s) about gastrointestinal issues are mostly better   Reviewed:  has a past medical history of Abnormal liver enzymes, Cardiac cirrhosis, Chronic combined systolic and diastolic CHF (congestive heart failure), CKD (chronic kidney disease) stage 3, GFR 30-59 ml/min (01/04/2016), CKD (chronic kidney disease), stage II, Congenital heart defect, Coronary artery disease, Gastric polyps (11/25/2022), Gastritis and gastroduodenitis (11/25/2022), Gastroesophageal reflux disease with esophagitis and hemorrhage (04/04/2021), Hematuria, History of umbilical hernia repair (07/10/2011), Hypercholesteremia, Hypertension, Incisional hernia (07/10/2011), Morbid obesity, Morbid obesity with BMI of 50.0-59.9, adult (01/04/2016), Nausea and vomiting (11/25/2022), OSA on CPAP, Paroxysmal atrial flutter (02/12/2016), 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 Active Ambulatory Problems    Diagnosis Date Noted   History of umbilical hernia repair [id number]   Hypertension 12/23/2015   Thrombocytopenia, idiopathic 12/23/2015   Sinus bradycardia 12/23/2015   CAD (coronary artery disease)    Atrial flutter with rapid ventricular response 02/12/2016   Morbid obesity    Chronic kidney disease, stage 4 (severe) 01/04/2016   Other long term (current)  drug therapy 02/19/2016   History of coronary artery bypass surgery 12/26/2015   Obstructive sleep apnea syndrome 02/19/2016   Chronic gout of multiple sites 01/20/2017   Alkaline phosphatase elevation 01/23/2017   Ectatic aorta 06/15/2020   Gastroesophageal reflux disease without esophagitis 09/05/2019   Herpes simplex  04/04/2021   NASH (nonalcoholic steatohepatitis) 01/23/2017   Prediabetes 01/20/2017   Statins contraindicated 11/01/2021   Hypothyroid 11/01/2021   Chronic venous stasis dermatitis of both lower extremities 10/13/2022   Chronic venous stasis 10/13/2022   Abnormal CT scan, gastrointestinal tract 11/25/2022   Nausea and vomiting 11/25/2022   Chronic liver failure without hepatic coma 01/26/2023   Splenomegaly 01/26/2023   Spondylolisthesis 01/26/2023   Bleeding diathesis 01/26/2023   Malaise and fatigue 09/05/2019   Allergic rhinitis due to pollen 09/05/2019   Amitriptyline adverse reaction 03/02/2023   Resolved Ambulatory Problems    Diagnosis Date Noted   Chest pain 12/23/2015   Mixed hyperlipidemia 12/23/2015   Renal insufficiency 12/23/2015   Hyperglycemia 12/23/2015   Unstable angina    OSA (obstructive sleep apnea)    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   Abnormal EKG 04/07/2016   Acute sinusitis 04/07/2016   Allergy to penicillin 04/07/2016   Atrial flutter 02/12/2016   Coronary artery disease involving native coronary artery of native heart without angina pectoris 01/04/2016   Bronchitis 05/06/2016   Chronic coronary artery disease 01/04/2016   Dyspepsia and other specified disorders of function of stomach 06/16/2014   Gout involving toe 01/04/2016   Morbid (severe) obesity due to excess calories 01/04/2016   Elevated TSH 11/27/2016   Elevated BUN 11/27/2016   Elevated serum creatinine 11/27/2016   Pseudoaneurysm following procedure 05/11/2017   High risk medication use 02/19/2016   Morbid obesity with BMI of 50.0-59.9, adult 01/04/2016   Elevated ALT measurement 02/09/2018   Elevated AST (SGOT) 02/09/2018   Abnormal nuclear cardiac imaging test 11/02/2020   Acute idiopathic gout 09/05/2019   Vitamin D deficiency 09/05/2019   Gastric polyps 11/25/2022   Gastritis and gastroduodenitis 11/25/2022   Abdominal pain 11/25/2022   CKD (chronic kidney disease) stage 3,  GFR 30-59 ml/min 01/04/2016   Gastroesophageal reflux disease with esophagitis and hemorrhage 04/04/2021   Past Medical History:  Diagnosis Date   Abnormal liver enzymes    Cardiac cirrhosis    Chronic combined systolic and diastolic CHF (congestive heart failure)    CKD (chronic kidney disease), stage II    Congenital heart defect    Coronary artery disease    Hematuria    Hypercholesteremia    Incisional hernia 07/10/2011   OSA on CPAP    Paroxysmal atrial flutter 02/12/2016   Thrombocytopenia     Outpatient Medications Prior to Visit  Medication Sig   acetaminophen (TYLENOL) 500 MG tablet Take 500-1,500 mg by mouth as needed for moderate pain, fever, headache or mild pain.   albuterol (VENTOLIN HFA) 108 (90 Base) MCG/ACT inhaler Inhale into the lungs.   allopurinol (ZYLOPRIM) 100 MG tablet TAKE 1 TABLET(100 MG) BY MOUTH DAILY   amitriptyline (ELAVIL) 25 MG tablet Take 1 tablet (25 mg total) by mouth at bedtime.   apixaban (ELIQUIS) 5 MG TABS tablet Take 1 tablet (5 mg total) by mouth 2 (two) times daily.   azelastine (ASTELIN) 0.1 % nasal spray Place 2 sprays into both nostrils 2 (two) times daily. (Patient taking differently: Place 2 sprays into both nostrils 2 (two) times daily. As needed.)   Cyanocobalamin (VITAMIN B-12) 5000 MCG SUBL  Place 5,000 mcg under the tongue daily.   diltiazem (CARDIZEM SR) 120 MG 12 hr capsule TAKE 1 CAPSULE(120 MG) BY MOUTH TWICE DAILY   docusate sodium (STOOL SOFTENER) 100 MG capsule Take 1 capsule (100 mg total) by mouth daily.   empagliflozin (JARDIANCE) 10 MG TABS tablet Take 1 tablet (10 mg total) by mouth daily before breakfast.   Evolocumab (REPATHA SURECLICK) [id number] MG/ML SOAJ INJECT THE CONTENTS OF 1 SYRINGE UNDER THE SKIN EVERY 14 DAYS   famotidine (PEPCID) 20 MG tablet TAKE 1 TABLET(20 MG) BY MOUTH TWICE DAILY   fluticasone (FLONASE) 50 MCG/ACT nasal spray as needed.   levothyroxine (SYNTHROID) 50 MCG tablet TAKE 1 TABLET(50 MCG) BY MOUTH  DAILY BEFORE BREAKFAST   losartan (COZAAR) 25 MG tablet Take 1 tablet (25 mg total) by mouth daily.   metoprolol succinate (TOPROL-XL) 25 MG 24 hr tablet TAKE 1 TABLET(25 MG) BY MOUTH DAILY   montelukast (SINGULAIR) 10 MG tablet TAKE 1 TABLET(10 MG) BY MOUTH DAILY   nitroGLYCERIN (NITROSTAT) 0.4 MG SL tablet Place 1 tablet (0.4 mg total) under the tongue every 5 (five) minutes as needed for chest pain.   ondansetron (ZOFRAN-ODT) 4 MG disintegrating tablet Take 1 tablet (4 mg total) by mouth every 8 (eight) hours as needed for nausea or vomiting (for nausea from wegovy or other source).   pantoprazole (PROTONIX) 40 MG tablet TAKE 1 TABLET(40 MG) BY MOUTH TWICE DAILY   polyethylene glycol powder (GLYCOLAX/MIRALAX) 17 GM/SCOOP powder Take 1 dose daily. May increase to BID if needed (Patient taking differently: Take 17 g by mouth daily as needed for moderate constipation.)   potassium chloride (KLOR-CON M) 10 MEQ tablet TAKE 1 TABLET(10 MEQ) BY MOUTH DAILY   sucralfate (CARAFATE) 1 g tablet TAKE 1 TABLET(1 GRAM) BY MOUTH TWICE DAILY. MAY INCREASE TO 4 TIMES DAILY FOR ANY ACUTE SYMPTOMS   [DISCONTINUED] tirzepatide (ZEPBOUND) 2.5 MG/0.5ML Pen Inject 2.5 mg into the skin once a week.   diltiazem (CARDIZEM CD) 120 MG 24 hr capsule TAKE 1 CAPSULE(120 MG) BY MOUTH DAILY (Patient not taking: Reported on 03/02/2023)   No facility-administered medications prior to visit.    HPI  Updated and modified:  Problem  Amitriptyline Adverse Reaction   Today he has not had any amitriptyline adverse reaction but it is recommended that he not be on amitriptyline due to the number of other medications that he takes and also his sleep apnea therefore we are developing a plan to limit use to only medically necessary use   Chronic Liver Failure Without Hepatic Coma   Difficult to confirm but suspect(s) mild iver failure based on elevated bili, inr, low platelet(s), large spleen, chronically elevated LFT Attempts to get  elastography were low quality, and claustrophobia prevented mri.  CT noncontrasted due to chronic kidney disease has minimal value in evaluating.  Lab Results  Component Value Date   INR 1.2 (H) 04/24/2022   INR 1.2 (H) 01/02/2021   INR 1.00 05/14/2017  .     Morbid Obesity   Getting zepbound 2.5 and denies any significant side effect(s)  Lab Results  Component Value Date   HGBA1C 5.2 09/30/2022   Lab Results  Component Value Date   TSH 2.96 09/03/2022   Wt Readings from Last 10 Encounters:  01/26/23 (!) 363 lb 12.8 oz (165 kg)  12/30/22 (!) 367 lb 12.8 oz (166.8 kg)  12/15/22 (!) 373 lb (169.2 kg)  12/08/22 (!) 388 lb 12.8 oz (176.4 kg)  11/25/22 (!) 384  lb 11.2 oz (174.5 kg)  11/13/22 (!) 384 lb 12.8 oz (174.5 kg)  10/13/22 (!) 385 lb (174.6 kg)  09/30/22 (!) 384 lb (174.2 kg)  09/24/22 (!) 382 lb (173.3 kg)  09/03/22 (!) 377 lb 3.2 oz (171.1 kg)   BMI Readings from Last 1 Encounters:  01/26/23 50.74 kg/m  No history bariatrics Complicated by  elevated alk phos status post cholecystecomy  Complicated by umbilical hernia Complicated by gout Complicated by coronary artery disease status post cabg Complicated by gastritis Complicated by chronic kidney disease 3-4 Complicated by obstructive sleep apnea  Complicated by aflutter   Chronic Kidney Disease, Stage 4 (Severe)   Last visit [place] [facility] discussed coming off carafate.  Lab Results  Component Value Date/Time   GFR 29.24 (L) 09/30/2022 02:51 PM   GFR 27.27 (L) 09/24/2022 03:40 PM   GFR 24.10 (L) 09/03/2022 03:45 PM   GFR 18.98 (L) 08/25/2022 12:21 PM   GFR 38.98 (L) 04/24/2022 02:39 PM   Seeing central  kidney   Hypertension  Gastric Polyps (Resolved)  Gastritis and Gastroduodenitis (Resolved)        Gastroesophageal Reflux Disease With Esophagitis and Hemorrhage (Resolved)  Ckd (Chronic Kidney Disease) Stage 3, Gfr 30-59 Ml/Min (Resolved)   Lab Results  Component Value Date/Time   GFR  29.24 (L) 09/30/2022 02:51 PM   GFR 27.27 (L) 09/24/2022 03:40 PM   GFR 24.10 (L) 09/03/2022 03:45 PM   GFR 18.98 (L) 08/25/2022 12:21 PM   GFR 38.98 (L) 04/24/2022 02:39 PM                 Clinical Data Analysis:   Physical Exam  BP 121/71 (BP Location: Right Arm, Patient Position: Sitting)   Pulse (!) 50   Temp 98.5 F (36.9 C) (Temporal)   Ht 5\' 11"  (1.803 m)   Wt (!) 360 lb 6.4 oz (163.5 kg)   SpO2 99%   BMI 50.27 kg/m  Wt Readings from Last 10 Encounters:  03/02/23 (!) 360 lb 6.4 oz (163.5 kg)  02/17/23 (!) 362 lb (164.2 kg)  01/26/23 (!) 363 lb 14.4 oz (165.1 kg)  01/26/23 (!) 363 lb 12.8 oz (165 kg)  12/30/22 (!) 367 lb 12.8 oz (166.8 kg)  12/15/22 (!) 373 lb (169.2 kg)  12/08/22 (!) 388 lb 12.8 oz (176.4 kg)  11/25/22 (!) 384 lb 11.2 oz (174.5 kg)  11/13/22 (!) 384 lb 12.8 oz (174.5 kg)  10/13/22 (!) 385 lb (174.6 kg)   Vital signs reviewed.  Nursing notes reviewed. Weight trend reviewed. Abnormalities and Problem-Specific physical exam findings:  truncal adiposity  General Appearance:  No acute distress appreciable.   Well-groomed, healthy-appearing male.  Well proportioned with no abnormal fat distribution.  Good muscle tone. Skin: Clear and well-hydrated. Pulmonary:  Normal work of breathing at rest, no respiratory distress apparent. SpO2: 99 %  Musculoskeletal: Patient demonstrates smooth and coordinated movements throughout all major joints.All extremities are intact.  Neurological:  Awake, alert, oriented, and engaged.  No obvious focal neurological deficits or cognitive impairments.  Sensorium seems unclouded. Gait is smooth and coordinated.  Speech is clear and coherent with logical content. Psychiatric:  Appropriate mood, pleasant and cooperative demeanor, cheerful and engaged during the exam    Additional Results Reviewed:     No results found for any visits on 03/02/23.  Recent Results (from the past 2160 hour(s))  CBC and differential      Status: Abnormal   Collection Time: 01/26/23 12:00 AM  Result Value  Ref Range   Hemoglobin 14.8 13.5 - 17.5   HCT 44 41 - 53   Neutrophils Absolute 6.28    Platelets 55 (A) 150 - 400 K/uL   WBC 7.3   CBC     Status: None   Collection Time: 01/26/23 12:00 AM  Result Value Ref Range   RBC 4.53 3.87 - 5.11  CMP (Cancer Center only)     Status: Abnormal   Collection Time: 01/26/23  4:17 PM  Result Value Ref Range   Sodium 141 135 - 145 mmol/L   Potassium 4.5 3.5 - 5.1 mmol/L   Chloride 110 98 - 111 mmol/L   CO2 21 (L) 22 - 32 mmol/L   Glucose, Bld 130 (H) 70 - 99 mg/dL    Comment: Glucose reference range applies only to samples taken after fasting for at least 8 hours.   BUN 39 (H) 6 - 20 mg/dL   Creatinine 2.33 (H) 0.61 - 1.24 mg/dL   Calcium 9.4 8.9 - 10.3 mg/dL   Total Protein 7.0 6.5 - 8.1 g/dL   Albumin 3.2 (L) 3.5 - 5.0 g/dL   AST 52 (H) 15 - 41 U/L   ALT 71 (H) 0 - 44 U/L   Alkaline Phosphatase 165 (H) 38 - 126 U/L   Total Bilirubin 1.1 0.3 - 1.2 mg/dL   GFR, Estimated 33 (L) >60 mL/min    Comment: (NOTE) Calculated using the CKD-EPI Creatinine Equation (2021)    Anion gap 10 5 - 15    Comment: Performed at [facility], [place] [address], [place], [place] [address]    No image results found.   No results found.   --------------------------------    Signed: Loralee Pacas, MD 03/02/2023 8:13 PM

## 2023-03-02 NOTE — Assessment & Plan Note (Signed)
Zepbound working good but we need to boost dose Encouraged continuing with diet and exercise

## 2023-03-05 ENCOUNTER — Other Ambulatory Visit: Payer: Self-pay

## 2023-03-05 NOTE — Progress Notes (Signed)
Informed patient of this information and removed the metoprolol from his medication list.

## 2023-03-09 ENCOUNTER — Telehealth: Payer: Self-pay | Admitting: Internal Medicine

## 2023-03-09 NOTE — Telephone Encounter (Signed)
Pt states the pharmacy told him that he needs a PA for  tirzepatide (ZEPBOUND) 5 MG/0.5ML Pen  Please advise.

## 2023-03-11 LAB — LAB REPORT - SCANNED: EGFR: 27

## 2023-03-11 NOTE — Progress Notes (Signed)
Reviewed and agree with management plans. Agree with referral to [facility]. Will need CT colonography for colon cancer screening every 5 years, so the next would be 04/2027.  Ginny Loomer L. Orvan Falconer, MD, MPH

## 2023-03-12 ENCOUNTER — Ambulatory Visit: Payer: BC Managed Care – PPO | Admitting: Gastroenterology

## 2023-03-12 NOTE — Progress Notes (Signed)
Sean [name], pls contact patient and let him know Dr. Orvan Falconer recommended for him to see a liver specialist due to having significant hepatic steatosis. Pls refer patient to Annamarie Major NP at [facility].   Please let him now he is due for a virtual colonoscopy 04/2027, please enter recall. THX

## 2023-03-12 NOTE — Telephone Encounter (Signed)
Pt would like a call back concerning the RX Zepbound. Please advise.

## 2023-03-16 ENCOUNTER — Ambulatory Visit: Payer: BC Managed Care – PPO | Admitting: Internal Medicine

## 2023-03-16 NOTE — Telephone Encounter (Signed)
Pt called again. Please advise.

## 2023-03-17 ENCOUNTER — Other Ambulatory Visit: Payer: Self-pay | Admitting: Internal Medicine

## 2023-03-17 DIAGNOSIS — R1013 Epigastric pain: Secondary | ICD-10-CM

## 2023-03-20 NOTE — Telephone Encounter (Signed)
Called and was unable to leave a vm, as it has not been set up yet. Sent my chart message concerning this.

## 2023-03-22 ENCOUNTER — Other Ambulatory Visit: Payer: Self-pay | Admitting: Gastroenterology

## 2023-03-23 ENCOUNTER — Other Ambulatory Visit: Payer: Self-pay | Admitting: Internal Medicine

## 2023-03-23 ENCOUNTER — Telehealth: Payer: Self-pay

## 2023-03-23 ENCOUNTER — Other Ambulatory Visit: Payer: Self-pay

## 2023-03-23 DIAGNOSIS — R1013 Epigastric pain: Secondary | ICD-10-CM

## 2023-03-23 NOTE — Telephone Encounter (Signed)
Pt made aware of Dr. Orvan Falconer recommendations:  Referral sent to Annamarie Major NP at [facility].. Pt made aware . Pt notified that if he has not heard anything from there office in 2 weeks then to give our office a call to check on the status of the referral. Colonoscopy recall placed for 04/2027. Pt made aware.  Pt verbalized understanding with all questions answered.

## 2023-03-23 NOTE — Telephone Encounter (Signed)
Message Received: 1 week ago Arnaldo Natal, NP  Emeline Darling, RN      Previous Messages  Routed Note  Author: Arnaldo Natal, NP Service: Gastroenterology Author Type: Nurse Practitioner  Filed: 03/12/2023 11:09 AM Encounter Date: 02/17/2023 Status: Signed  Editor: Arnaldo Natal, NP (Nurse Practitioner)  Sean [name], pls contact patient and let him know Dr. Orvan Falconer recommended for him to see a liver specialist due to having significant hepatic steatosis. Pls refer patient to Annamarie Major NP at [facility].    Please let him now he is due for a virtual colonoscopy 04/2027, please enter recall. THX     Office Visit for Cirrhosis 02/17/2023 Arnaldo Natal, NP - [facility] Gastroenterology Diagnosis  Nausea And Vomiting, Unspecified Vomiting Type (Primary) Orders Signed This Visit  None Orders Pended This Visit  None Progress Notes  Arnaldo Natal, NP at 02/17/2023  1:30 PM  Status: Signed  Sean [name], pls contact patient and let him know Dr. Orvan Falconer recommended for him to see a liver specialist due to having significant hepatic steatosis. Pls refer patient to Annamarie Major NP at [facility].    Please let him now he is due for a virtual colonoscopy 04/2027, please enter recall. [name], MD at 02/17/2023  1:30 PM  Status: Signed  Reviewed and agree with management plans. Agree with referral to [facility]. Will need CT colonography for colon cancer screening every 5 years, so the next would be 04/2027.   Kimberly L. Orvan Falconer, MD, MPH      Arnaldo Natal, NP at 02/17/2023  1:30 PM

## 2023-03-23 NOTE — Telephone Encounter (Signed)
Vickie from [facility] calling wishing to speak with the person who sent the referral for patient. Please advise

## 2023-03-24 NOTE — Telephone Encounter (Signed)
Spoke with Vickie at [facility] in regard to Fax that was sent over. Original fax that was sent was incomplete with only 9 pages sent. Fax was resent yesterday and confirmed that 21 pages were sent.  Vickie stated that she did receive the full faxed report this AM.

## 2023-03-24 NOTE — Telephone Encounter (Signed)
Spoke with patient concerning this previously.

## 2023-04-02 ENCOUNTER — Other Ambulatory Visit: Payer: Self-pay | Admitting: Oncology

## 2023-04-02 DIAGNOSIS — D472 Monoclonal gammopathy: Secondary | ICD-10-CM

## 2023-04-03 ENCOUNTER — Inpatient Hospital Stay: Payer: BC Managed Care – PPO | Attending: Oncology | Admitting: Oncology

## 2023-04-03 ENCOUNTER — Inpatient Hospital Stay: Payer: BC Managed Care – PPO

## 2023-04-03 ENCOUNTER — Other Ambulatory Visit: Payer: Self-pay | Admitting: Oncology

## 2023-04-03 VITALS — BP 153/77 | HR 69 | Temp 98.1°F | Resp 18 | Ht 71.0 in | Wt 362.2 lb

## 2023-04-03 DIAGNOSIS — D472 Monoclonal gammopathy: Secondary | ICD-10-CM

## 2023-04-03 DIAGNOSIS — D539 Nutritional anemia, unspecified: Secondary | ICD-10-CM | POA: Insufficient documentation

## 2023-04-03 DIAGNOSIS — D696 Thrombocytopenia, unspecified: Secondary | ICD-10-CM | POA: Diagnosis not present

## 2023-04-03 DIAGNOSIS — D693 Immune thrombocytopenic purpura: Secondary | ICD-10-CM | POA: Diagnosis not present

## 2023-04-03 LAB — CMP (CANCER CENTER ONLY)
ALT: 64 U/L — ABNORMAL HIGH (ref 0–44)
AST: 53 U/L — ABNORMAL HIGH (ref 15–41)
Albumin: 2.9 g/dL — ABNORMAL LOW (ref 3.5–5.0)
Alkaline Phosphatase: 195 U/L — ABNORMAL HIGH (ref 38–126)
Anion gap: 8 (ref 5–15)
BUN: 39 mg/dL — ABNORMAL HIGH (ref 6–20)
CO2: 20 mmol/L — ABNORMAL LOW (ref 22–32)
Calcium: 8.6 mg/dL — ABNORMAL LOW (ref 8.9–10.3)
Chloride: 109 mmol/L (ref 98–111)
Creatinine: 3.02 mg/dL — ABNORMAL HIGH (ref 0.61–1.24)
GFR, Estimated: 24 mL/min — ABNORMAL LOW (ref >60)
Glucose, Bld: 95 mg/dL (ref 70–99)
Potassium: 3.9 mmol/L (ref 3.5–5.1)
Sodium: 137 mmol/L (ref 135–145)
Total Bilirubin: 0.8 mg/dL (ref 0.3–1.2)
Total Protein: 6.4 g/dL — ABNORMAL LOW (ref 6.5–8.1)

## 2023-04-03 LAB — LACTATE DEHYDROGENASE: LDH: 155 U/L (ref 98–192)

## 2023-04-03 LAB — CBC: RBC: 3.83 — AB (ref 3.87–5.11)

## 2023-04-03 LAB — CBC AND DIFFERENTIAL
HCT: 38 — AB (ref 41–53)
Hemoglobin: 12.9 — AB (ref 13.5–17.5)
Neutrophils Absolute: 2.34
Platelets: 61 10*3/uL — AB (ref 150–400)
WBC: 5.2

## 2023-04-04 LAB — BETA 2 MICROGLOBULIN, SERUM: Beta-2 Microglobulin: 7.1 mg/L — ABNORMAL HIGH (ref 0.6–2.4)

## 2023-04-05 ENCOUNTER — Encounter: Payer: Self-pay | Admitting: Internal Medicine

## 2023-04-05 DIAGNOSIS — D472 Monoclonal gammopathy: Secondary | ICD-10-CM | POA: Insufficient documentation

## 2023-04-05 DIAGNOSIS — E8809 Other disorders of plasma-protein metabolism, not elsewhere classified: Secondary | ICD-10-CM | POA: Insufficient documentation

## 2023-04-05 NOTE — Progress Notes (Signed)
[facility]  [address] [place],  [place]  [address] [phone]  Clinic Day:  04/03/2023  Referring physician: Lula Olszewski, MD   HISTORY OF PRESENT ILLNESS:  The patient is a 52 y.o. 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  His platelets have chronically held around 70k. However, he comes in today as recent labs at his nephrologist's office showed findings worrisome for a plasma cell dyscrasia, such as multiple myeloma.  Myeloma parameters were apparently done as he has had worsening renal function.  His SPEP showed an M-spike of 0.9 grams.  His kappa light chanin level was elevatd at 493.1 mg/L, and his IgA level was elevated at 1309 mg/dL.  The patient denies having any bone pain or progressive fatigue.  However, he understands why there is consternation about multiple myeloma potentially being present.    PHYSICAL EXAM:  Blood pressure (!) 153/77, pulse 69, temperature 98.1 F (36.7 C), resp. rate 18, height 5\' 11"  (1.803 m), weight (!) 362 lb 3.2 oz (164.3 kg), SpO2 95 %. Wt Readings from Last 3 Encounters:  04/03/23 (!) 362 lb 3.2 oz (164.3 kg)  03/02/23 (!) 360 lb 6.4 oz (163.5 kg)  02/17/23 (!) 362 lb (164.2 kg)   Body mass index is 50.52 kg/m. Performance status (ECOG): 1 - Symptomatic but completely ambulatory Physical Exam Constitutional:      Appearance: He is obese. He is ill-appearing.  HENT:     Mouth/Throat:     Mouth: Mucous membranes are moist.     Pharynx: Oropharynx is clear. No oropharyngeal exudate or posterior oropharyngeal erythema.  Cardiovascular:     Rate and Rhythm: Regular rhythm. Tachycardia present.     Heart sounds: No murmur heard.    No friction rub. No gallop.  Pulmonary:     Effort: Pulmonary effort is normal. No respiratory distress.     Breath sounds: Normal breath sounds. No wheezing, rhonchi or rales.  Abdominal:     General: Bowel sounds are normal. There is no  distension.     Palpations: Abdomen is soft. There is no mass.     Tenderness: There is no abdominal tenderness.  Musculoskeletal:        General: No swelling.     Right lower leg: No edema.     Left lower leg: No edema.  Lymphadenopathy:     Cervical: No cervical adenopathy.     Upper Body:     Right upper body: No supraclavicular or axillary adenopathy.     Left upper body: No supraclavicular or axillary adenopathy.     Lower Body: No right inguinal adenopathy. No left inguinal adenopathy.  Skin:    General: Skin is warm.     Coloration: Skin is not jaundiced.     Findings: No lesion or rash.  Neurological:     General: No focal deficit present.     Mental Status: He is alert and oriented to person, place, and time. Mental status is at baseline.  Psychiatric:        Mood and Affect: Mood normal.        Behavior: Behavior normal.        Thought Content: Thought content normal.     LABS:       Latest Ref Rng & Units 01/26/2023   12:00 AM 09/24/2022    3:40 PM 09/03/2022    3:45 PM  CBC  WBC  7.3  5.9  4.7   Hemoglobin 13.5 - 17.5 14.8     11.4  10.9   Hematocrit 41 - 53 44     33.3  32.5   Platelets 150 - 400 K/uL 55     62.0  84.0      This result is from an external source.       Latest Ref Rng & Units 04/03/2023    4:09 PM 01/26/2023    4:17 PM 11/25/2022   12:00 AM  CMP  Glucose 70 - 99 mg/dL 95  161    BUN 6 - 20 mg/dL 39  39    Creatinine 0.96 - 1.24 mg/dL 0.45  4.09    Sodium 811 - 145 mmol/L 137  141    Potassium 3.5 - 5.1 mmol/L 3.9  4.5    Chloride 98 - 111 mmol/L 109  110    CO2 22 - 32 mmol/L 20  21    Calcium 8.9 - 10.3 mg/dL 8.6  9.4    Total Protein 6.5 - 8.1 g/dL 6.4  7.0    Total Bilirubin 0.3 - 1.2 mg/dL 0.8  1.1    Alkaline Phos 38 - 126 U/L 195  165  205   AST 15 - 41 U/L 53  52    ALT 0 - 44 U/L 64  71      Latest Reference Range & Units 04/03/23 16:09  LDH 98 - 192 U/L 155    ASSESSMENT & PLAN:  Assessment/Plan:  A 52 y.o. male  with a history of thrombocytopenia, but whose recent labs showed the possibility of multiple myeloma being present.  The patient understands a bone marrow biopsy is ultimately necessary to confirm this diagnosis.  I will have interventional radiology perform this procedure early next week.  I will also have him undergo a skeletal survey next week to see if any type of lytic lesion is present, which would also elucidate the likelihood of multiple myeloma being present, if abnormal.  I will see him back in 2 weeks to go over the results from all of the aforementioned tests to determine if multiple myeloma is indeed present.  The patient understands all the plans discussed today and is in agreement with them.     Aydin Hink Kirby Funk, MD

## 2023-04-06 LAB — KAPPA/LAMBDA LIGHT CHAINS
Kappa free light chain: 473.5 mg/L — ABNORMAL HIGH (ref 3.3–19.4)
Kappa, lambda light chain ratio: 16.11 — ABNORMAL HIGH (ref 0.26–1.65)
Lambda free light chains: 29.4 mg/L — ABNORMAL HIGH (ref 5.7–26.3)

## 2023-04-07 ENCOUNTER — Telehealth: Payer: Self-pay | Admitting: Oncology

## 2023-04-07 NOTE — Telephone Encounter (Signed)
04/07/23 Spoke with patient and scheduled Bone Marrow @745am -RH

## 2023-04-10 ENCOUNTER — Telehealth (HOSPITAL_COMMUNITY): Payer: Self-pay | Admitting: *Deleted

## 2023-04-10 ENCOUNTER — Encounter (HOSPITAL_COMMUNITY): Payer: Self-pay

## 2023-04-10 DIAGNOSIS — D539 Nutritional anemia, unspecified: Secondary | ICD-10-CM | POA: Diagnosis not present

## 2023-04-10 DIAGNOSIS — R779 Abnormality of plasma protein, unspecified: Secondary | ICD-10-CM | POA: Diagnosis not present

## 2023-04-10 DIAGNOSIS — D649 Anemia, unspecified: Secondary | ICD-10-CM | POA: Diagnosis not present

## 2023-04-10 DIAGNOSIS — D696 Thrombocytopenia, unspecified: Secondary | ICD-10-CM | POA: Diagnosis not present

## 2023-04-10 LAB — COMPREHENSIVE METABOLIC PANEL
Albumin: 3.4 — AB (ref 3.5–5.0)
Calcium: 9.2 (ref 8.7–10.7)

## 2023-04-10 LAB — MULTIPLE MYELOMA PANEL, SERUM
Albumin SerPl Elph-Mcnc: 3 g/dL (ref 2.9–4.4)
Albumin/Glob SerPl: 1.1 (ref 0.7–1.7)
Alpha 1: 0.3 g/dL (ref 0.0–0.4)
Alpha2 Glob SerPl Elph-Mcnc: 0.6 g/dL (ref 0.4–1.0)
B-Globulin SerPl Elph-Mcnc: 1.4 g/dL — ABNORMAL HIGH (ref 0.7–1.3)
Gamma Glob SerPl Elph-Mcnc: 0.6 g/dL (ref 0.4–1.8)
Globulin, Total: 3 g/dL (ref 2.2–3.9)
IgA: 1090 mg/dL — ABNORMAL HIGH (ref 90–386)
IgG (Immunoglobin G), Serum: 603 mg/dL (ref 603–1613)
IgM (Immunoglobulin M), Srm: 100 mg/dL (ref 20–172)
M Protein SerPl Elph-Mcnc: 0.9 g/dL — ABNORMAL HIGH
Total Protein ELP: 6 g/dL (ref 6.0–8.5)

## 2023-04-10 LAB — CBC: RBC: 3.92 (ref 3.87–5.11)

## 2023-04-10 LAB — POCT INR: INR: 1 (ref 0.80–1.20)

## 2023-04-10 LAB — CBC AND DIFFERENTIAL
HCT: 39 — AB (ref 41–53)
Hemoglobin: 12.8 — AB (ref 13.5–17.5)
Neutrophils Absolute: 2.65
Platelets: 72 10*3/uL — AB (ref 150–400)
WBC: 5.2

## 2023-04-10 LAB — BASIC METABOLIC PANEL
BUN: 33 — AB (ref 4–21)
CO2: 23 — AB (ref 13–22)
Chloride: 111 — AB (ref 99–108)
Creatinine: 3 — AB (ref 0.6–1.3)
EGFR: 22
Glucose: 104
Potassium: 4.5 mEq/L (ref 3.5–5.1)
Sodium: 143 (ref 137–147)

## 2023-04-10 LAB — PROTIME-INR: Protime: 10.3 (ref 10.0–13.8)

## 2023-04-10 LAB — HEPATIC FUNCTION PANEL
ALT: 74 U/L — AB (ref 10–40)
AST: 60 — AB (ref 14–40)
Alkaline Phosphatase: 227 — AB (ref 25–125)
Bilirubin, Total: 0.7

## 2023-04-10 NOTE — Telephone Encounter (Signed)
Called to give pt mpi study instructions. No answer, VM not set up.

## 2023-04-14 LAB — SURGICAL PATHOLOGY

## 2023-04-15 ENCOUNTER — Telehealth: Payer: Self-pay | Admitting: Oncology

## 2023-04-15 ENCOUNTER — Other Ambulatory Visit: Payer: Self-pay | Admitting: Internal Medicine

## 2023-04-15 ENCOUNTER — Inpatient Hospital Stay: Payer: BC Managed Care – PPO | Admitting: Oncology

## 2023-04-15 VITALS — BP 150/72 | HR 64 | Temp 97.6°F | Resp 18 | Ht 71.0 in | Wt 366.4 lb

## 2023-04-15 DIAGNOSIS — I129 Hypertensive chronic kidney disease with stage 1 through stage 4 chronic kidney disease, or unspecified chronic kidney disease: Secondary | ICD-10-CM | POA: Diagnosis not present

## 2023-04-15 DIAGNOSIS — E8809 Other disorders of plasma-protein metabolism, not elsewhere classified: Secondary | ICD-10-CM | POA: Diagnosis not present

## 2023-04-15 DIAGNOSIS — N184 Chronic kidney disease, stage 4 (severe): Secondary | ICD-10-CM | POA: Diagnosis not present

## 2023-04-15 DIAGNOSIS — N1832 Chronic kidney disease, stage 3b: Secondary | ICD-10-CM | POA: Diagnosis not present

## 2023-04-15 DIAGNOSIS — R7303 Prediabetes: Secondary | ICD-10-CM | POA: Diagnosis not present

## 2023-04-15 DIAGNOSIS — I2510 Atherosclerotic heart disease of native coronary artery without angina pectoris: Secondary | ICD-10-CM

## 2023-04-15 DIAGNOSIS — I480 Paroxysmal atrial fibrillation: Secondary | ICD-10-CM | POA: Diagnosis not present

## 2023-04-15 NOTE — Telephone Encounter (Signed)
04/15/23 spoke with patient and scheduled next appt.Sent order for Bone survey to op ctr

## 2023-04-15 NOTE — Progress Notes (Signed)
[facility]  [address] [place],  [place]  [address] [phone]  Clinic Day:  04/15/2023  Referring physician: Lula Olszewski, MD   HISTORY OF PRESENT ILLNESS:  The patient is a 52 y.o. 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  However, he was recently brought back to my attention as recent labs and his nephrologist's office showed abnormal monoclonal parameters that were highly suspicious for multiple myeloma being present.  He comes in today to go over his bone marrow biopsy results, which was done last week, to determine if this plasma cell dyscrasia is present.  Since his last visit, the patient has been doing well.  He denies having increased fatigue, bone pain, or other systemic symptoms which overtly concern him for multiple myeloma being present.  PHYSICAL EXAM:  Blood pressure (!) 150/72, pulse 64, temperature 97.6 F (36.4 C), temperature source Oral, resp. rate 18, height 5\' 11"  (1.803 m), weight (!) 366 lb 6.4 oz (166.2 kg), SpO2 95 %. Wt Readings from Last 3 Encounters:  04/15/23 (!) 366 lb 6.4 oz (166.2 kg)  04/03/23 (!) 362 lb 3.2 oz (164.3 kg)  03/02/23 (!) 360 lb 6.4 oz (163.5 kg)   Body mass index is 51.1 kg/m. Performance status (ECOG): 1 - Symptomatic but completely ambulatory Physical Exam Constitutional:      Appearance: He is obese.  HENT:     Mouth/Throat:     Mouth: Mucous membranes are moist.     Pharynx: Oropharynx is clear. No oropharyngeal exudate or posterior oropharyngeal erythema.  Cardiovascular:     Rate and Rhythm: Regular rhythm. Tachycardia present.     Heart sounds: No murmur heard.    No friction rub. No gallop.  Pulmonary:     Effort: Pulmonary effort is normal. No respiratory distress.     Breath sounds: Normal breath sounds. No wheezing, rhonchi or rales.  Abdominal:     General: Bowel sounds are normal. There is no distension.     Palpations: Abdomen is soft. There is  no mass.     Tenderness: There is no abdominal tenderness.  Musculoskeletal:        General: No swelling.     Right lower leg: No edema.     Left lower leg: No edema.  Lymphadenopathy:     Cervical: No cervical adenopathy.     Upper Body:     Right upper body: No supraclavicular or axillary adenopathy.     Left upper body: No supraclavicular or axillary adenopathy.     Lower Body: No right inguinal adenopathy. No left inguinal adenopathy.  Skin:    General: Skin is warm.     Coloration: Skin is not jaundiced.     Findings: No lesion or rash.  Neurological:     General: No focal deficit present.     Mental Status: He is alert and oriented to person, place, and time. Mental status is at baseline.  Psychiatric:        Mood and Affect: Mood normal.        Behavior: Behavior normal.        Thought Content: Thought content normal.   PATHOLOGY:  His bone marrow biopsy revealed the following: BONE MARROW, ASPIRATE, CLOT, CORE: -Slightly hypercellular bone marrow for age with plasma cell neoplasm -See comment  PERIPHERAL BLOOD: -Mild macrocytic anemia -Thrombocytopenia  COMMENT:  The bone marrow is slightly hypercellular for age with increased number of plasma cells  representing 7% of all cells in the aspirate with lack of large aggregates or sheets.  The plasma cells display kappa light chain restriction consistent with plasma cell neoplasm.  The background shows trilineage hematopoiesis with scattering of atypical megakaryocytes, possibly secondary in nature especially in the setting of ITP, kidney disease, etc.  Nonetheless, correlation with cytogenetic and FISH studies is recommended.  MICROSCOPIC DESCRIPTION:  PERIPHERAL BLOOD SMEAR: The red blood cells display mild anisopoikilocytosis with mild polychromasia.  The white blood cells are normal in number with scattered neutrophils displaying mild toxic granulation.  The platelets are decreased in number.  BONE MARROW  ASPIRATE: Bone marrow particles present Erythroid precursors: Progressive maturation with scattered late precursors displaying nuclear cytoplasmic dyssynchrony or irregular nuclei. Granulocytic precursors: Orderly and progressive maturation for the most part.  Scattered mature neutrophils display mild toxic granulation. Megakaryocytes: Abundant with scattered small and/or hypolobated forms or large hyperchromatic cells. Lymphocytes/plasma cells: The plasma cells represent 7% of all cells with lack of large aggregates or sheets.  Many of the plasma cells display atypical cytologic features with cytomegaly and/or small nucleoli.  Large lymphoid aggregates are not seen.  TOUCH PREPARATIONS: A mixture of cell types present  CLOT AND BIOPSY: The sections show 60 to 70% cellularity with a mixture of cell types including abundant megakaryocytes.  Scattered small plasma cell clusters are present.  Significant lymphoid aggregates are not seen.  Immunohistochemical stain for CD138 and in situ hybridization for kappa and lambda were performed on blocks B1 and C1 with appropriate controls.  CD138 highlights the plasma cell component consisting of interstitial cells and small clusters and displays kappa light chain restriction.  IRON STAIN: Iron stains are performed on a bone marrow aspirate or touch imprint smear and section of clot. The controls stained appropriately.       Storage Iron: Present      Ring Sideroblasts: Absent  ADDITIONAL DATA/TESTING: The specimen was sent for cytogenetic analysis and FISH for multiple myeloma and a separate report will follow.  CELL COUNT DATA:  Bone Marrow count performed on 500 cells shows: Blasts:   0%   Myeloid:  51% Promyelocytes: 0%   Erythroid:     35% Myelocytes:    15%  Lymphocytes:   6% Metamyelocytes:     3%   Plasma cells:  7% Bands:    7% Neutrophils:   20%  M:E ratio:     1.5 Eosinophils:   6% Basophils:     0% Monocytes:     1%  Lab  Data: CBC performed on 04/10/2023 shows: WBC: 5.2 k/uL  Neutrophils:   52% Hgb: 12.8 g/dL Lymphocytes:   16% HCT: 38.7 %    Monocytes:     12% MCV: 99 fL     Eosinophils:   8% RDW: 14.5 %    Basophils:     1% PLT: 72 k/uL   LABS:       Latest Ref Rng & Units 04/10/2023   12:00 AM 04/03/2023   12:00 AM 01/26/2023   12:00 AM  CBC  WBC  5.2     5.2     7.3      Hemoglobin 13.5 - 17.5 12.8     12.9     14.8      Hematocrit 41 - 53 39     38     44      Platelets 150 - 400 K/uL 72     61     55  This result is from an external source.       Latest Ref Rng & Units 04/10/2023   12:00 AM 04/03/2023    4:09 PM 01/26/2023    4:17 PM  CMP  Glucose 70 - 99 mg/dL  95  295   BUN 4 - 21 33     39  39   Creatinine 0.6 - 1.3 3.0     3.02  2.33   Sodium 137 - 147 143     137  141   Potassium 3.5 - 5.1 mEq/L 4.5     3.9  4.5   Chloride 99 - 108 111     109  110   CO2 13 - 22 23     20  21    Calcium 8.7 - 10.7 9.2     8.6  9.4   Total Protein 6.5 - 8.1 g/dL  6.4  7.0   Total Bilirubin 0.3 - 1.2 mg/dL  0.8  1.1   Alkaline Phos 25 - 125 227     195  165   AST 14 - 40 60     53  52   ALT 10 - 40 U/L 74     64  71      This result is from an external source.     Latest Reference Range & Units 04/03/23 16:09  LDH 98 - 192 U/L 155    ASSESSMENT & PLAN:  Assessment/Plan:  A 52 y.o. male with a history of thrombocytopenia, but whose recent labs showed the possibility of multiple myeloma being present.  In clinic today, I went over all of his bone marrow biopsy results, which, by themselves, do not qualify him for having multiple myeloma.  Ultimately, greater than 10% of the bone marrow's cells need to be plasma cells in order for the diagnosis of multiple myeloma to be met.  However, there are other extenuating circumstances that could still give him the diagnosis of multiple myeloma.  I will have him undergo a skeletal survey, which was apparently not done since his last visit.  If the  skeletal survey clearly shows lytic bone lesions, then his diagnosis of both multiple myeloma would be met.  I am also awaiting FISH and cytogenetic results from his bone marrow biopsy to determine if there are any findings that would be highly suggestive of multiple myeloma being present.  I will see him back in 2 weeks to go over these additional studies/tests to further clarify what type of plasma cell dyscrasia he likely has.  The patient understands all the plans discussed today and is in agreement with them.     Geovannie Vilar Kirby Funk, MD

## 2023-04-16 ENCOUNTER — Other Ambulatory Visit (HOSPITAL_COMMUNITY): Payer: BC Managed Care – PPO

## 2023-04-16 ENCOUNTER — Ambulatory Visit (HOSPITAL_COMMUNITY): Payer: BC Managed Care – PPO | Attending: Cardiovascular Disease

## 2023-04-16 ENCOUNTER — Ambulatory Visit (HOSPITAL_BASED_OUTPATIENT_CLINIC_OR_DEPARTMENT_OTHER): Payer: BC Managed Care – PPO

## 2023-04-16 DIAGNOSIS — I25812 Atherosclerosis of bypass graft of coronary artery of transplanted heart without angina pectoris: Secondary | ICD-10-CM

## 2023-04-16 DIAGNOSIS — I2510 Atherosclerotic heart disease of native coronary artery without angina pectoris: Secondary | ICD-10-CM | POA: Diagnosis not present

## 2023-04-16 DIAGNOSIS — I77819 Aortic ectasia, unspecified site: Secondary | ICD-10-CM | POA: Insufficient documentation

## 2023-04-16 LAB — MYOCARDIAL PERFUSION IMAGING

## 2023-04-16 LAB — ECHOCARDIOGRAM COMPLETE
Area-P 1/2: 3.88 cm2
Height: 71 in
S' Lateral: 3.2 cm
Weight: 5760 oz

## 2023-04-16 MED ORDER — REGADENOSON 0.4 MG/5ML IV SOLN
0.4000 mg | Freq: Once | INTRAVENOUS | Status: AC
Start: 2023-04-16 — End: 2023-04-16
  Administered 2023-04-16: 0.4 mg via INTRAVENOUS

## 2023-04-16 MED ORDER — PERFLUTREN LIPID MICROSPHERE
1.0000 mL | INTRAVENOUS | Status: AC | PRN
Start: 2023-04-16 — End: 2023-04-16
  Administered 2023-04-16: 2 mL via INTRAVENOUS

## 2023-04-16 MED ORDER — TECHNETIUM TC 99M TETROFOSMIN IV KIT
32.0000 | PACK | Freq: Once | INTRAVENOUS | Status: AC | PRN
  Administered 2023-04-16: 32 via INTRAVENOUS

## 2023-04-17 ENCOUNTER — Ambulatory Visit (HOSPITAL_COMMUNITY): Payer: BC Managed Care – PPO | Attending: Internal Medicine

## 2023-04-17 LAB — MYOCARDIAL PERFUSION IMAGING
LV dias vol: 199 mL (ref 62–150)
LV sys vol: 86 mL
Nuc Stress EF: 57 %
Peak HR: 73 {beats}/min
Rest HR: 52 {beats}/min
Rest Nuclear Isotope Dose: 30.9 mCi
SDS: 8
SRS: 6
SSS: 14
ST Depression (mm): 0 mm
Stress Nuclear Isotope Dose: 32 mCi
TID: 1

## 2023-04-17 MED ORDER — TECHNETIUM TC 99M TETROFOSMIN IV KIT
30.9000 | PACK | Freq: Once | INTRAVENOUS | Status: AC | PRN
  Administered 2023-04-17: 30.9 via INTRAVENOUS

## 2023-04-17 MED ORDER — TECHNETIUM TC 99M TETROFOSMIN IV KIT: 29.8000 | PACK | Freq: Once | INTRAVENOUS | Status: AC | PRN

## 2023-04-18 ENCOUNTER — Encounter: Payer: Self-pay | Admitting: Internal Medicine

## 2023-04-18 DIAGNOSIS — I517 Cardiomegaly: Secondary | ICD-10-CM | POA: Insufficient documentation

## 2023-04-19 LAB — LAB REPORT - SCANNED: Creatinine, POC: 57.7 mg/dL

## 2023-04-21 ENCOUNTER — Encounter (HOSPITAL_COMMUNITY): Payer: Self-pay | Admitting: Oncology

## 2023-04-22 ENCOUNTER — Encounter (HOSPITAL_COMMUNITY): Payer: Self-pay | Admitting: Oncology

## 2023-04-27 ENCOUNTER — Other Ambulatory Visit: Payer: Self-pay | Admitting: Internal Medicine

## 2023-04-27 DIAGNOSIS — M1A09X0 Idiopathic chronic gout, multiple sites, without tophus (tophi): Secondary | ICD-10-CM

## 2023-04-29 NOTE — Progress Notes (Signed)
[facility]  [address] [place],  [place]  [address] [phone]  Clinic Day:  04/30/2023  Referring physician: Lula Olszewski, MD   HISTORY OF PRESENT ILLNESS:  The patient is a 52 y.o. 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  However, recently abnormal monoclonal parameters raised the suspicion for multiple myeloma being present.  His recent bone marrow biopsy revealed  7% plasma cells.  At the time of his last visit, other bone marrow biopsy studies were pending.  Furthermore, he had yet to undergo a skeletal survey.  He comes in today to go over these results to determine if there is any other information which would theoretically qualify him as having multiple myeloma.  Since his last visit, the patient has been doing well.  He denies having increased fatigue, bone pain, or other systemic symptoms which overtly concern him for multiple myeloma overtly being present.  PHYSICAL EXAM:  Blood pressure (!) 154/74, pulse 68, temperature 98.4 F (36.9 C), resp. rate 16, height 5\' 11"  (1.803 m), weight (!) 371 lb 4.8 oz (168.4 kg), SpO2 95 %. Wt Readings from Last 3 Encounters:  04/30/23 (!) 371 lb 4.8 oz (168.4 kg)  04/16/23 (!) 360 lb (163.3 kg)  04/15/23 (!) 366 lb 6.4 oz (166.2 kg)   Body mass index is 51.79 kg/m. Performance status (ECOG): 1 - Symptomatic but completely ambulatory Physical Exam Constitutional:      Appearance: He is obese.  HENT:     Mouth/Throat:     Mouth: Mucous membranes are moist.     Pharynx: Oropharynx is clear. No oropharyngeal exudate or posterior oropharyngeal erythema.  Cardiovascular:     Rate and Rhythm: Regular rhythm. Tachycardia present.     Heart sounds: No murmur heard.    No friction rub. No gallop.  Pulmonary:     Effort: Pulmonary effort is normal. No respiratory distress.     Breath sounds: Normal breath sounds. No wheezing, rhonchi or rales.  Abdominal:     General:  Bowel sounds are normal. There is no distension.     Palpations: Abdomen is soft. There is no mass.     Tenderness: There is no abdominal tenderness.  Musculoskeletal:        General: No swelling.     Right lower leg: No edema.     Left lower leg: No edema.  Lymphadenopathy:     Cervical: No cervical adenopathy.     Upper Body:     Right upper body: No supraclavicular or axillary adenopathy.     Left upper body: No supraclavicular or axillary adenopathy.     Lower Body: No right inguinal adenopathy. No left inguinal adenopathy.  Skin:    General: Skin is warm.     Coloration: Skin is not jaundiced.     Findings: No lesion or rash.  Neurological:     General: No focal deficit present.     Mental Status: He is alert and oriented to person, place, and time. Mental status is at baseline.  Psychiatric:        Mood and Affect: Mood normal.        Behavior: Behavior normal.        Thought Content: Thought content normal.   PATHOLOGY:  His bone marrow biopsy revealed the following: BONE MARROW, ASPIRATE, CLOT, CORE: -Slightly hypercellular bone marrow for age with plasma cell neoplasm -See comment  PERIPHERAL BLOOD: -Mild macrocytic anemia -Thrombocytopenia  COMMENT:  The bone marrow is slightly hypercellular for age with increased number of plasma cells representing 7% of all cells in the aspirate with lack of large aggregates or sheets.  The plasma cells display kappa light chain restriction consistent with plasma cell neoplasm.  The background shows trilineage hematopoiesis with scattering of atypical megakaryocytes, possibly secondary in nature especially in the setting of ITP, kidney disease, etc.  Nonetheless, correlation with cytogenetic and FISH studies is recommended.  MICROSCOPIC DESCRIPTION:  PERIPHERAL BLOOD SMEAR: The red blood cells display mild anisopoikilocytosis with mild polychromasia.  The white blood cells are normal in number with scattered neutrophils  displaying mild toxic granulation.  The platelets are decreased in number.  BONE MARROW ASPIRATE: Bone marrow particles present Erythroid precursors: Progressive maturation with scattered late precursors displaying nuclear cytoplasmic dyssynchrony or irregular nuclei. Granulocytic precursors: Orderly and progressive maturation for the most part.  Scattered mature neutrophils display mild toxic granulation. Megakaryocytes: Abundant with scattered small and/or hypolobated forms or large hyperchromatic cells. Lymphocytes/plasma cells: The plasma cells represent 7% of all cells with lack of large aggregates or sheets.  Many of the plasma cells display atypical cytologic features with cytomegaly and/or small nucleoli.  Large lymphoid aggregates are not seen.  TOUCH PREPARATIONS: A mixture of cell types present  CLOT AND BIOPSY: The sections show 60 to 70% cellularity with a mixture of cell types including abundant megakaryocytes.  Scattered small plasma cell clusters are present.  Significant lymphoid aggregates are not seen.  Immunohistochemical stain for CD138 and in situ hybridization for kappa and lambda were performed on blocks B1 and C1 with appropriate controls.  CD138 highlights the plasma cell component consisting of interstitial cells and small clusters and displays kappa light chain restriction.  IRON STAIN: Iron stains are performed on a bone marrow aspirate or touch imprint smear and section of clot. The controls stained appropriately.       Storage Iron: Present      Ring Sideroblasts: Absent  ADDITIONAL DATA/TESTING: The specimen was sent for cytogenetic analysis and FISH for multiple myeloma and a separate report will follow.  CELL COUNT DATA:  Bone Marrow count performed on 500 cells shows: Blasts:   0%   Myeloid:  51% Promyelocytes: 0%   Erythroid:     35% Myelocytes:    15%  Lymphocytes:   6% Metamyelocytes:     3%   Plasma cells:  7% Bands:     7% Neutrophils:   20%  M:E ratio:     1.5 Eosinophils:   6% Basophils:     0% Monocytes:     1%  Lab Data: CBC performed on 04/10/2023 shows: WBC: 5.2 k/uL  Neutrophils:   52% Hgb: 12.8 g/dL Lymphocytes:   16% HCT: 38.7 %    Monocytes:     12% MCV: 99 fL     Eosinophils:   8% RDW: 14.5 %    Basophils:     1% PLT: 72 k/uL  ------------------------------------------------------------------------ Cytogenetics:  ----------------------------------------------------------------------------- Myeloma FISH Panel:   LABS:       Latest Ref Rng & Units 04/10/2023   12:00 AM 04/03/2023   12:00 AM 01/26/2023   12:00 AM  CBC  WBC  5.2     5.2     7.3      Hemoglobin 13.5 - 17.5 12.8     12.9     14.8      Hematocrit 41 - 53 39     38  44      Platelets 150 - 400 K/uL 72     61     55         This result is from an external source.      Latest Ref Rng & Units 04/10/2023   12:00 AM 04/03/2023    4:09 PM 01/26/2023    4:17 PM  CMP  Glucose 70 - 99 mg/dL  95  960   BUN 4 - 21 33     39  39   Creatinine 0.6 - 1.3 3.0     3.02  2.33   Sodium 137 - 147 143     137  141   Potassium 3.5 - 5.1 mEq/L 4.5     3.9  4.5   Chloride 99 - 108 111     109  110   CO2 13 - 22 23     20  21    Calcium 8.7 - 10.7 9.2     8.6  9.4   Total Protein 6.5 - 8.1 g/dL  6.4  7.0   Total Bilirubin 0.3 - 1.2 mg/dL  0.8  1.1   Alkaline Phos 25 - 125 227     195  165   AST 14 - 40 60     53  52   ALT 10 - 40 U/L 74     64  71      This result is from an external source.    Latest Reference Range & Units 04/03/23 16:09  LDH 98 - 192 U/L 155   SCANS:  His skeletal survey done today revealed the following: FINDINGS: There are no focal lytic or sclerotic lesions.  There is partial fusion of bodies of C2 and C3 vertebrae. Metallic sutures are seen in the sternum. Left hemidiaphragm is elevated. Last lumbar vertebra is transitional.  IMPRESSION: There are no focal lytic or sclerotic lesions in the  visualized axial and appendicular skeleton.  Elevation of left hemidiaphragm may be due to eventration or paralysis. There is previous median sternotomy.  ASSESSMENT & PLAN:  Assessment/Plan:  A 52 y.o. 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  As mentioned previously, his bone marrow biopsy showed less than 10% plasma cells, which does not qualify him for having multiple myeloma.  Furthermore, his myeloma FISH panel and cytogenetics did not reveal any type of chromosomal breakages/abnormalities.  His skeletal survey was also negative.  The patient understands that his monoclonal parameters will continue to be followed over time.  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  As this all started based upon a recent renal workup, the patient understands that if his renal function gets worse over time, a renal biopsy may ultimately be necessary to establish what disease process is actually behind his renal dysfunction.  Otherwise, I will see this patient back in 4 months to reassess his next set of monoclonal parameters.  The patient understands all the plans discussed today and is in agreement with them.    Gretna Bergin Kirby Funk, MD

## 2023-04-30 ENCOUNTER — Inpatient Hospital Stay (INDEPENDENT_AMBULATORY_CARE_PROVIDER_SITE_OTHER): Payer: BC Managed Care – PPO | Admitting: Oncology

## 2023-04-30 VITALS — BP 154/74 | HR 68 | Temp 98.4°F | Resp 16 | Ht 71.0 in | Wt 371.3 lb

## 2023-04-30 DIAGNOSIS — M8580 Other specified disorders of bone density and structure, unspecified site: Secondary | ICD-10-CM | POA: Diagnosis not present

## 2023-04-30 DIAGNOSIS — D472 Monoclonal gammopathy: Secondary | ICD-10-CM

## 2023-05-04 ENCOUNTER — Ambulatory Visit (INDEPENDENT_AMBULATORY_CARE_PROVIDER_SITE_OTHER): Payer: BC Managed Care – PPO | Admitting: Internal Medicine

## 2023-05-04 ENCOUNTER — Encounter: Payer: Self-pay | Admitting: Oncology

## 2023-05-04 ENCOUNTER — Encounter: Payer: Self-pay | Admitting: Internal Medicine

## 2023-05-04 VITALS — BP 137/75 | HR 60 | Temp 98.7°F | Ht 71.0 in | Wt 374.0 lb

## 2023-05-04 DIAGNOSIS — R748 Abnormal levels of other serum enzymes: Secondary | ICD-10-CM

## 2023-05-04 DIAGNOSIS — E8809 Other disorders of plasma-protein metabolism, not elsewhere classified: Secondary | ICD-10-CM | POA: Diagnosis not present

## 2023-05-04 DIAGNOSIS — E785 Hyperlipidemia, unspecified: Secondary | ICD-10-CM

## 2023-05-04 DIAGNOSIS — E039 Hypothyroidism, unspecified: Secondary | ICD-10-CM

## 2023-05-04 DIAGNOSIS — D699 Hemorrhagic condition, unspecified: Secondary | ICD-10-CM

## 2023-05-04 DIAGNOSIS — I872 Venous insufficiency (chronic) (peripheral): Secondary | ICD-10-CM | POA: Diagnosis not present

## 2023-05-04 DIAGNOSIS — R933 Abnormal findings on diagnostic imaging of other parts of digestive tract: Secondary | ICD-10-CM

## 2023-05-04 DIAGNOSIS — N184 Chronic kidney disease, stage 4 (severe): Secondary | ICD-10-CM

## 2023-05-04 DIAGNOSIS — K7210 Chronic hepatic failure without coma: Secondary | ICD-10-CM | POA: Diagnosis not present

## 2023-05-04 DIAGNOSIS — I2510 Atherosclerotic heart disease of native coronary artery without angina pectoris: Secondary | ICD-10-CM

## 2023-05-04 DIAGNOSIS — M1A09X0 Idiopathic chronic gout, multiple sites, without tophus (tophi): Secondary | ICD-10-CM

## 2023-05-04 LAB — GAMMA GT: GGT: 299 U/L — ABNORMAL HIGH (ref 7–51)

## 2023-05-04 LAB — LIPID PANEL
Cholesterol: 173 mg/dL (ref 0–200)
HDL: 53.7 mg/dL (ref >39.00)
LDL Cholesterol: 93 mg/dL (ref 0–99)
NonHDL: 118.98
Total CHOL/HDL Ratio: 3
Triglycerides: 132 mg/dL (ref 0.0–149.0)
VLDL: 26.4 mg/dL (ref 0.0–40.0)

## 2023-05-04 MED ORDER — ZEPBOUND 5 MG/0.5ML ~~LOC~~ SOAJ
5.0000 mg | SUBCUTANEOUS | 5 refills | Status: DC
Start: 2023-05-04 — End: 2023-06-17

## 2023-05-04 MED ORDER — ZEPBOUND 2.5 MG/0.5ML ~~LOC~~ SOAJ
2.5000 mg | SUBCUTANEOUS | 0 refills | Status: DC
Start: 2023-05-04 — End: 2023-06-17

## 2023-05-04 NOTE — Assessment & Plan Note (Addendum)
Fib 4 score calculated 4.84 Encouraged patient to to follow up with hepatology- he reports he will see next month

## 2023-05-04 NOTE — Assessment & Plan Note (Addendum)
Encouraged continuing with Repatha. Can't take statin Will check fasting lipid profile

## 2023-05-04 NOTE — Assessment & Plan Note (Addendum)
Following with hematology Advised patient to discuss with hematologist and cardiologist I defer to them about eliquis Gave them info about needs to go to ER if any blood in stool

## 2023-05-04 NOTE — Assessment & Plan Note (Signed)
Need a uric acid but its low priority- didn't discuss getting today so will do next blood draw

## 2023-05-04 NOTE — Assessment & Plan Note (Signed)
Repatha only no statin With known coronary artery disease, likely no reason to check high risk markers at this point Will check levels though as part of a plan to confirm Repatha working well, he has been on 3 years

## 2023-05-04 NOTE — Progress Notes (Signed)
Sean [name]: [phone]   Routine Medical Office Visit  Patient:  Sean [name]      Age: 52 y.o.       Sex:  male  Date:   05/04/2023 PCP:    Sean Olszewski, MD   Today's Healthcare Provider: Lula Olszewski, MD   Assessment and Plan:   Based on [employer] AI conversational text extraction: Assessment and Plan    Monoclonal Gammopathy of Unknown Significance (MGUS): Elevated protein levels in blood and urine. Bone marrow biopsy and full skeletal scan negative for myeloma. Discussed potential for conversion to myeloma in the future and potential kidney damage from proteins. -Continue monitoring with hematologist. -Stay hydrated and avoid dehydrating activities.  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 Discussed importance of hydration and avoidance of nephrotoxic medications. -Continue Jardiance for kidney protection. -Plan for kidney biopsy with specialist.  Liver Disease: Elevated alkaline phosphatase potentially related to bile duct obstruction post-cholecystectomy. Discussed potential for liver failure if not monitored and addressed. -Continue monitoring with GI specialist. -Order GGT test for upcoming liver specialist appointment.  Weight Management: Difficulty obtaining insurance coverage for weight loss medication (Zetbound). Discussed potential for weight loss to improve liver and kidney function. -Attempt to obtain approval for Zetbound 2.5mg . -Consider starting on generic weight loss medications (metformin, Wellbutrin, topiramate) if Zetbound not approved.  Hyperlipidemia: On Repatha for cholesterol management. Last cholesterol panel done in 2021. -Order cholesterol panel to assess efficacy of Repatha.  Thyroid Disease: On thyroid medication. Last thyroid check unknown. -Order thyroid function tests.  Cardiovascular Disease: History of open heart surgery. On Eliquis for anticoagulation. Discussed  potential risk of bleeding due to low platelets and potential to discontinue Eliquis with cardiologist's approval. -Continue Eliquis as per cardiologist's advice. -Discuss with hematologist and cardiologist regarding potential to discontinue Eliquis.  Gastroesophageal Reflux Disease (GERD): On multiple medications for heartburn to prevent GI bleed. -Continue current regimen.  Gout: On allopurinol for gout prevention. -Continue allopurinol.  General Health Maintenance: -Ensure completion of Hepatitis B vaccination series. -Consider COVID-19 booster shot.      Based on updated problems and orders placed with problem based charting:  Assessment and Plan Sean [name] was seen today for 2 month follow-up.  Chronic kidney disease, stage 4 (severe) (HCC) Overview: Lab Results  Component Value Date/Time   GFR 29.24 (L) 09/30/2022 02:51 PM   GFR 27.27 (L) 09/24/2022 03:40 PM   GFR 24.10 (L) 09/03/2022 03:45 PM   GFR 18.98 (L) 08/25/2022 12:21 PM   GFR 38.98 (L) 04/24/2022 02:39 PM   Seeing central Sean [name] kidney Nephrologist: Sean Emms, MD Lastt plan 04/2023: # CKD stage IV Secondary to plasma cell dyscrasia and HTN  We discussed the diagnosis of chronic kidney disease On ARB  Nephrologist: Sean Emms, MD   Discussed with patient to avoid NSAID's and to inform any prescribing providers of CKD diagnosis We are able to do a renal biopsy if needed and if this will help guide care. At this time plans are in place for skeletal surgery and meds as above so we are holding off  #Plasma cell dyscrasia Conceming for multiple myeloma and awaiting final results from bone marrow biopsy. Skeletal survey is planned Regimen per hem/onc follows with Sean Harding, MD  Assessment & Plan: Defer to nephrology and oncology (mgus associated chronic kidney disease) Encouraged to drink plenty of fluids, avoid NSAIDs   Orders: -     Zepbound; Inject 2.5 mg into  the skin once a week.  Dispense: 2  mL; Refill: 0 -     Zepbound; Inject 5 mg into the skin once a week.  Dispense: 2 mL; Refill: 5  Morbid obesity (HCC) Overview: Ran out of zepbound Metformin contraindicated by chronic stage 4 kidney disease Stimulants & bariatrics relatively contraindicated by coronary artery disease  Wellbutrin- wants to avoid Topiramate - haven't tried  Lab Results  Component Value Date   HGBA1C 5.2 09/30/2022   Lab Results  Component Value Date   TSH 2.96 09/03/2022    Wt Readings from Last 10 Encounters:  05/04/23 (!) 374 lb (169.6 kg)  04/30/23 (!) 371 lb 4.8 oz (168.4 kg)  04/16/23 (!) 360 lb (163.3 kg)  04/15/23 (!) 366 lb 6.4 oz (166.2 kg)  04/03/23 (!) 362 lb 3.2 oz (164.3 kg)  03/02/23 (!) 360 lb 6.4 oz (163.5 kg)  02/17/23 (!) 362 lb (164.2 kg)  01/26/23 (!) 363 lb 14.4 oz (165.1 kg)  01/26/23 (!) 363 lb 12.8 oz (165 kg)  12/30/22 (!) 367 lb 12.8 oz (166.8 kg)   BMI Readings from Last 1 Encounters:  05/04/23 52.16 kg/m   No history bariatrics Complicated by  elevated alk phos status post cholecystecomy  Complicated by umbilical hernia Complicated by gout Complicated by coronary artery disease status post cabg Complicated by gastritis Complicated by chronic kidney disease 3-4 Complicated by obstructive sleep apnea  Complicated by aflutter  Assessment & Plan: We are having challenges getting zepbound but reordered and I offered to fight for prior authorization.  Will try topiramate if unable to get zepbound  Orders: -     Zepbound; Inject 2.5 mg into the skin once a week.  Dispense: 2 mL; Refill: 0 -     Zepbound; Inject 5 mg into the skin once a week.  Dispense: 2 mL; Refill: 5  Hypoalbuminemia  Chronic venous stasis dermatitis of both lower extremities Overview: He wears compression stockings and rarely uses Lasix due to he has chronic kidney disease Limit lasix and diuretic due to chronic kidney disease Likely associated with liver and kidney disease  contributing.    Alkaline phosphatase elevation Overview: Lab Results  Component Value Date   ALKPHOS 227 (A) 04/10/2023   ALKPHOS 195 (H) 04/03/2023   ALKPHOS 165 (H) 01/26/2023   ALKPHOS 205 (H) 11/25/2022   ALKPHOS 161 (H) 09/30/2022   ALKPHOS 156 (H) 09/24/2022   ALKPHOS 369 (H) 09/03/2022   ALKPHOS 429 (H) 08/25/2022   ALKPHOS 202 (A) 07/24/2022   ALKPHOS 180 (H) 04/24/2022   Fractionated alk phos was normal Lab Results  Component Value Date   ALT 74 (A) 04/10/2023   ALT 64 (H) 04/03/2023  History cholecystectomy ?Nonalcoholic Fatty Liver Disease (NAFLD)  2023: negative asma/antimitochondrial/ceruloplasmin/alpha1AT, positive Anti Nuclear Antigen (ANA) 1:40 speckled, homogenous Negative acute hepatitis panel mid 2023 08/2022 ultrasound  Liver: No focal lesion identified. Increased parenchymal echogenicity. Portal vein is patent on color Doppler maging with normal direction of blood flow towards the liver. 01/2021 MRI abdomen: Splenomegaly measuring up to 15 cm in maximal craniocaudal dimension.   Assessment & Plan: Confirmed he has follow up with hepatologist, Dr. Orvan Falconer leaving No results found for: "GGT" so will order Also fib-4 N/A will update for upcoming liver specialist   Orders: -     Gamma GT  Bleeding diathesis (HCC) Overview: Chronically low platelets, suspect liver disease,and takes eliquis due to history of open heart surgery Lab Results  Component Value Date/Time   PLT  72 (A) 04/10/2023 12:00 AM   PLT 61 (A) 04/03/2023 12:00 AM   PLT 55 (A) 01/26/2023 12:00 AM   PLT 62.0 (L) 09/24/2022 03:40 PM   PLT 84.0 (L) 09/03/2022 03:45 PM   PLT 58 (LL) 10/31/2020 09:58 AM   PLT 50 (LL) 08/13/2020 09:09 AM   PLT 79 (LL) 09/14/2019 01:50 PM   PLT 89 (LL) 12/08/2018 04:38 PM   PLT 83 (LL) 08/21/2017 12:33 PM     Assessment & Plan: Following with hematology Advised patient to discuss with hematologist and cardiologist I defer to them about eliquis Gave  them info about needs to go to ER if any blood in stool    Chronic gout of multiple sites, unspecified cause Overview: May 04, 2023 interim history:  no recent flaresno flare since around 2020 compliant with allopurinol  Prior history: Takes no red meat and allopurinol 100 daily   No results found for: "LABURIC"    Assessment & Plan: Need a uric acid but its low priority- didn't discuss getting today so will do next blood draw   Hyperlipidemia, unspecified hyperlipidemia type Overview: Medications: not yet discussed Lab Results  Component Value Date   HDL 49 08/13/2020   HDL 52 10/21/2019   HDL 49 09/14/2019   CHOLHDL 2.2 08/13/2020   CHOLHDL 2.3 10/21/2019   CHOLHDL 3.9 09/14/2019   Lab Results  Component Value Date   LDLCALC 37 08/13/2020   LDLCALC 45 10/21/2019   LDLCALC 107 (H) 09/14/2019   LDLDIRECT 45 10/21/2019   LDLDIRECT 98 09/14/2019   Lab Results  Component Value Date   TRIG 118 08/13/2020   TRIG 110 10/21/2019   TRIG 189 (H) 09/14/2019   Lab Results  Component Value Date   CHOL 107 08/13/2020   CHOL 117 10/21/2019   CHOL 189 09/14/2019   The 10-year ASCVD risk score (Arnett DK, et al., 2019) is: 3.1%   Values used to calculate the score:     Age: 37 years     Sex: Male     Is Non-Hispanic African American: No     Diabetic: No     Tobacco smoker: No     Systolic Blood Pressure: 137 mmHg     Is BP treated: Yes     HDL Cholesterol: 57 MG/DL     Total Cholesterol: 166 MG/DL Lab Results  Component Value Date   ALT 74 (A) 04/10/2023   AST 60 (A) 04/10/2023   ALKPHOS 227 (A) 04/10/2023   TSH 2.96 09/03/2022   HGBA1C 5.2 09/30/2022   Body mass index is 52.16 kg/m.  Lipoprotein(a), Apolipoprotein B (ApoB), and High-sensitivity C-reactive protein (hs-CRP) No results found for: "HSCRP", "LIPOA" Improving Your Cholesterol: Diet: Focus on a Mediterranean-style diet, limit saturated fats and sugars, and increase omega-3 fatty acids (fish,  flaxseeds,nuts,extra virgin olive oil, avocados). Exercise: Engage in regular physical activity (aerobic exercises are particularly beneficial for HDL). Weight Management: Maintain a healthy weight. Smoking Cessation: Quitting smoking improves cholesterol levels.   Assessment & Plan: Repatha only no statin With known coronary artery disease, likely no reason to check high risk markers at this point Will check levels though as part of a plan to confirm Repatha working well, he has been on 3 years   Orders: -     Lipid panel  Coronary artery disease involving native coronary artery of native heart without angina pectoris Overview: Cardiologist Christell Constant, MD Stress tests Prior study (10/04/2021): Abnormal perfusion: Apical to mid inferior fixed  defect and apical to mid anterior wall defect (both considered artifact). Left ventricular ejection fraction (LVEF): 47%. Current study: 04/17/23 Large size, moderate intensity fixed anterior and inferior perfusion defects (consistent with artifact). LVEF: 57%, moderately dilated LV with normal wall motion. Overall, this study is low risk, and the LVEF has improved compared to the prior study. The perfusion defects remain unchanged and are likely artifact.  Assessment & Plan: Encouraged continuing with Repatha. Can't take statin Will check fasting lipid profile    Acquired hypothyroidism Overview: On 50 mcg levothyroxine long term  Lab Results  Component Value Date   TSH 2.96 09/03/2022   TSH 2.34 12/06/2021   TSH 3.220 08/13/2020   TSH 3.810 09/14/2019   TSH 2.850 12/08/2018   TSH 3.020 08/21/2017   TSH 2.410 12/29/2016   TSH 4.54 (H) 11/26/2016   TSH 3.432 02/12/2016   TSH 3.776 12/23/2015   FREET4 1.14 12/29/2016   anti-TPO antibodies: N/A  anti-Tg antibodies: N/A  Anti-TSI antibodies: N/A    Family history of thyroid disease: Patient last evaluated for thyroid nodules: Last ultrasound for thyroid nodules: N/A   Last heart exam for rhythm check:       Orders: -     TSH Rfx on Abnormal to Free T4  Chronic liver failure without hepatic coma (HCC) Overview: Difficult to confirm but suspect(s) mild liver failure based on elevated bili, inr, low platelet(s), large spleen, chronically elevated LFT Attempts to get elastography were low quality, and claustrophobia prevented mri.  CT noncontrasted due to chronic kidney disease has minimal value in evaluating.  Lab Results  Component Value Date   INR 1.00 04/10/2023   INR 1.2 (H) 04/24/2022   INR 1.2 (H) 01/02/2021   PROTIME 10.3 04/10/2023   Lab Results  Component Value Date   LABPROT 12.6 04/24/2022   Lab Results  Component Value Date/Time   ALBUMIN 3.4 (A) 04/10/2023 12:00 AM   ALBUMIN 2.9 (L) 04/03/2023 04:09 PM   ALBUMIN 3.2 (L) 01/26/2023 04:17 PM   ALBUMIN 3.7 (L) 08/13/2021 09:55 AM   ALBUMIN 3.7 (L) 08/13/2020 09:09 AM   ALBUMIN 3.9 (L) 10/21/2019 09:42 AM   Lab Results  Component Value Date   NA 143 04/10/2023   K 4.5 04/10/2023   CL 111 (A) 04/10/2023   CO2 23 (A) 04/10/2023   GLUCOSE 95 04/03/2023   BUN 33 (A) 04/10/2023   CREATININE 3.0 (A) 04/10/2023   CALCIUM 9.2 04/10/2023   PROT 6.4 (L) 04/03/2023   ALBUMIN 3.4 (A) 04/10/2023   AST 60 (A) 04/10/2023   ALT 74 (A) 04/10/2023   ALKPHOS 227 (A) 04/10/2023   BILITOT 0.8 04/03/2023   WBC 5.2 04/10/2023   HGB 12.8 (A) 04/10/2023   HCT 39 (A) 04/10/2023   MCV 100.1 (H) 09/24/2022   PLT 72 (A) 04/10/2023   CHOL 107 08/13/2020   HDL 49 08/13/2020   LDLCALC 37 08/13/2020   TRIG 118 08/13/2020   CHOLHDL 2.2 08/13/2020   Lab Results  Component Value Date   FOLATE 5.3 (L) 07/24/2022   VITAMINB12 2,988 (H) 07/24/2022   TSH 2.96 09/03/2022   FREET4 1.14 12/29/2016   LIPASE 60 (H) 07/24/2022   ANA POSITIVE (A) 04/24/2022   HGBA1C 5.2 09/30/2022    Lab Results  Component Value Date   TSH 2.96 09/03/2022   TSH 2.34 12/06/2021   TSH 3.220 08/13/2020   FREET4  1.14 12/29/2016      Component Value Date/Time   TOTALPROTELP 6.0 04/03/2023  1609   LDH 155 04/03/2023 1609   IGGSERUM 603 04/03/2023 1609   IGMSERUM 100 04/03/2023 1609       Component Value Date/Time   WBC 5.2 04/10/2023 0000   WBC 5.9 09/24/2022 1540   RBC 3.92 04/10/2023 0000   HGB 12.8 (A) 04/10/2023 0000   HGB 14.4 10/31/2020 0958   HCT 39 (A) 04/10/2023 0000   HCT 42.5 10/31/2020 0958   PLT 72 (A) 04/10/2023 0000   PLT 58 (LL) 10/31/2020 0958   LABPROT 12.6 04/24/2022 1439   INR 1.00 04/10/2023 0000   APTT 33 05/14/2017 1157      Component Value Date/Time   NA 143 04/10/2023 0000   K 4.5 04/10/2023 0000   CL 111 (A) 04/10/2023 0000   CO2 23 (A) 04/10/2023 0000   BUN 33 (A) 04/10/2023 0000   CREATININE 3.0 (A) 04/10/2023 0000   CREATININE 3.02 (H) 04/03/2023 1609   CREATININE 1.95 (H) 12/13/2021 1808   GLU 104 04/10/2023 0000   CALCIUM 9.2 04/10/2023 0000   ALKPHOS 227 (A) 04/10/2023 0000   AST 60 (A) 04/10/2023 0000   AST 53 (H) 04/03/2023 1609   ALT 74 (A) 04/10/2023 0000   ALT 64 (H) 04/03/2023 1609   BILITOT 0.8 04/03/2023 1609     Fibrosis 4 Score = 4.84 (High risk)       Interpretation for patients with HCV          <1.45       -  F0-F1 (Low risk)          1.45-3.25 -  Indeterminate           >3.25      -  F3-F4 (High risk)     Validated for ages 47-65      Per gastrointestinal consultation 10/2022: He has been under evaluation for nausea, early morning vomiting, and epigastric pain.  He will occasionally have central, lower abdominal pain. Symptoms have been present for over 30 years. He stated waking up sick every morning for as long as he can remember, since childhood.  He awakens in the morning with nausea and vomits.  However, for the past 4 to 5 years he vomits up a small amount bright red blood with clear phlegm most mornings. He showed me a photo from his phone of blood and clear phlegm he recently vomited into the bathtub. Evaluation has  included EGD and abdominal ultrasound.   Longstanding history of GERD treated the best with esomeprazole.  Did not find pantoprazole as effective. Frequently sleeps elevated in a recliner. He is taking Famotidine 20mg  QD. He is not taking Pantoprazole 40mg  bid as prescribed as it was not covered by his insurance, instead he is taking another PPI otc once or twice daly (he cannot recall the name of which PPI he is taking).    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    Endoscopic history: - EGD 01/2021 with Dr. Orvan Falconer which showed gastric intestinal metaplasia with a normal esophagus.  No evidence of esophageal or gastric varices.   - Colonoscopy 01/3021 which was incomplete, one tubular adenomatous polyp was removed from the colon - CT colonography 04/22/21 showed no polyp or mass. He did not tolerate the imaging study well and vowed to never do it again.   Elastography 01/15/21 showed an echogenic liver, Median kPa: 3.7; however, the study was limited in  accuracy due to body habitus.  CT scan at [facility] 08/28/2022 to evaluate the abdominal pain: Mild wall thickening of the proximal duodenum with periduodenal stranding which may represent duodenitis Abdominal ultrasound 09/23/22 showed fatty liver Labs 09/24/2022: Creatinine 2.66, glucose 146, sodium 142, albumin 3.2, total bilirubin 1.3, AST 33, ALT 30, alk phos 156, hemoglobin 11.4, MCV 100.1, RDW 16.8, platelets 62 Last INR 04/24/2022 was 1.2   Assessment & Plan: Fib 4 score calculated 4.84 Encouraged patient to to follow up with hepatology- he reports he will see next month  Orders: -     Gamma GT -     FIB-4 w/Rx NASH FibroSure Plus  Abnormal CT scan, gastrointestinal tract    Treatment plan discussed and reviewed in detail. Explained medication safety and potential side effects.  Answered all patient questions and confirmed  understanding and comfort with the plan. Encouraged patient to contact our office if they have any questions or concerns. Agreed on patient returning to office if symptoms worsen, persist, or new symptoms develop. Discussed precautions in case of needing to visit the Emergency Department.         Clinical Presentation:    52 y.o. male here today for 2 month follow-up  Based on [employer] AI conversational text extraction:  Discussed the use of AI scribe software for clinical note transcription with the patient, who gave verbal consent to proceed.  History of Present Illness   The patient, a self-employed individual with a complex medical history, presents with concerns regarding his weight management and the discontinuation of his weight loss medication, Zepbound, due to insurance coverage issues. The patient reports a noticeable difference in his condition since stopping the medication approximately two to three months ago.  The patient has a history of open-heart surgery and is currently on Eliquis, a blood thinner. He also has a history of gout, managed with Allopurinol, and is on Jardiance for kidney protection. The patient has been on Repatha, a cholesterol-lowering medication, for about two to three years.  The patient has been diagnosed with chronic kidney disease stage four and has been referred for a kidney biopsy. He also has a history of low platelets, which puts him at high risk for bleeding, especially while on Eliquis.  The patient has been diagnosed with fatty liver disease and is due to see a liver specialist. He has also been diagnosed with an abnormal CT scan of the GI tract, which is being followed up.  The patient has been under investigation for monoclonal gammopathy of unknown significance, which led to a bone marrow biopsy and a full skeletal scan. The results came back negative for myeloma, but the patient was informed that it could develop later.  The patient has been  managing heartburn with Carafate and is on thyroid medication. He has not had his cholesterol or thyroid levels checked in a while.  The patient has a history of heart disease, with a dilated left ventricle, and is under the care of a cardiologist. He also has a history of elevated alkaline phosphatase levels, which have been elevated for years, possibly due to bile getting clogged up in the bile duct without the gallbladder.  The patient is self-employed and has been struggling with insurance coverage for his weight loss medication. He has expressed frustration with the healthcare system and the high cost of medications. Despite his complex medical history, the patient remains active in managing his health and is open to alternative weight loss medications.  Reviewed chart data: Active Ambulatory Problems    Diagnosis Date Noted   History of umbilical hernia repair 07/10/2011   Hypertension 12/23/2015   Hyperlipidemia 12/23/2015   Thrombocytopenia, idiopathic (HCC) 12/23/2015   Sinus bradycardia 12/23/2015   CAD (coronary artery disease)    Atrial flutter with rapid ventricular response (HCC) 02/12/2016   Morbid obesity (HCC)    Chronic kidney disease, stage 4 (severe) (HCC) 01/04/2016   Other long term (current) drug therapy 02/19/2016   History of coronary artery bypass surgery 12/26/2015   Obstructive sleep apnea syndrome 02/19/2016   Chronic gout of multiple sites 01/20/2017   Alkaline phosphatase elevation 01/23/2017   Ectatic aorta (HCC) 06/15/2020   Gastroesophageal reflux disease without esophagitis 09/05/2019   Herpes simplex 04/04/2021   NASH (nonalcoholic steatohepatitis) 01/23/2017   Prediabetes 01/20/2017   Statins contraindicated 11/01/2021   Hypothyroid 11/01/2021   Chronic venous stasis dermatitis of both lower extremities 10/13/2022   Nausea and vomiting 11/25/2022   Chronic liver failure without hepatic coma (HCC) 01/26/2023   Splenomegaly 01/26/2023    Spondylolisthesis 01/26/2023   Bleeding diathesis (HCC) 01/26/2023   Malaise and fatigue 09/05/2019   Allergic rhinitis due to pollen 09/05/2019   Amitriptyline adverse reaction 03/02/2023   MGUS (monoclonal gammopathy of unknown significance) 04/05/2023   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 04/18/2023   Resolved Ambulatory Problems    Diagnosis Date Noted   Chest pain 12/23/2015   Renal insufficiency 12/23/2015   Hyperglycemia 12/23/2015   Unstable angina (HCC)    OSA (obstructive sleep apnea)    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   Abnormal EKG 04/07/2016   Acute sinusitis 04/07/2016   Allergy to penicillin 04/07/2016   Atrial flutter (HCC) 02/12/2016   Coronary artery disease involving native coronary artery of native heart without angina pectoris 01/04/2016   Bronchitis 05/06/2016   Chronic coronary artery disease 01/04/2016   Dyspepsia and other specified disorders of function of stomach 06/16/2014   Gout involving toe 01/04/2016   Morbid (severe) obesity due to excess calories (HCC) 01/04/2016   Elevated TSH 11/27/2016   Elevated BUN 11/27/2016   Elevated serum creatinine 11/27/2016   Pseudoaneurysm following procedure (HCC) 05/11/2017   High risk medication use 02/19/2016   Morbid obesity with BMI of 50.0-59.9, adult (HCC) 01/04/2016   Elevated ALT measurement 02/09/2018   Elevated AST (SGOT) 02/09/2018   Abnormal nuclear cardiac imaging test 11/02/2020   Acute idiopathic gout 09/05/2019   Vitamin D deficiency 09/05/2019   Abnormal CT scan, gastrointestinal tract 11/25/2022   Gastric polyps 11/25/2022   Gastritis and gastroduodenitis 11/25/2022   Abdominal pain 11/25/2022   CKD (chronic kidney disease) stage 3, GFR 30-59 ml/min (HCC) 01/04/2016   Gastroesophageal reflux disease with esophagitis and hemorrhage 04/04/2021   Past Medical History:  Diagnosis Date   Abnormal liver enzymes    Cardiac cirrhosis    Chronic combined systolic and diastolic CHF  (congestive heart failure) (HCC)    CKD (chronic kidney disease), stage II    Congenital heart defect    Coronary artery disease    Hematuria    Hypercholesteremia    Incisional hernia 07/10/2011   OSA on CPAP    Paroxysmal atrial flutter (HCC) 02/12/2016   Thrombocytopenia (HCC)     Outpatient Medications Prior to Visit  Medication Sig   acetaminophen (TYLENOL) 500 MG tablet Take 500-1,500 mg by mouth as needed for moderate pain, fever, headache or mild pain.   albuterol (VENTOLIN HFA) 108 (90 Base)  MCG/ACT inhaler Inhale into the lungs.   allopurinol (ZYLOPRIM) 100 MG tablet TAKE 1 TABLET(100 MG) BY MOUTH DAILY   amitriptyline (ELAVIL) 25 MG tablet TAKE 1 TABLET(25 MG) BY MOUTH AT BEDTIME   apixaban (ELIQUIS) 5 MG TABS tablet Take 1 tablet (5 mg total) by mouth 2 (two) times daily.   azelastine (ASTELIN) 0.1 % nasal spray Place 2 sprays into both nostrils 2 (two) times daily. (Patient taking differently: Place 2 sprays into both nostrils 2 (two) times daily. As needed.)   Cyanocobalamin (VITAMIN B-12) 5000 MCG SUBL Place 5,000 mcg under the tongue daily.   diltiazem (CARDIZEM SR) 120 MG 12 hr capsule TAKE 1 CAPSULE(120 MG) BY MOUTH TWICE DAILY   empagliflozin (JARDIANCE) 10 MG TABS tablet Take 1 tablet (10 mg total) by mouth daily before breakfast.   Evolocumab (REPATHA SURECLICK) 140 MG/ML SOAJ INJECT THE CONTENTS OF 1 SYRINGE UNDER THE SKIN EVERY 14 DAYS   famotidine (PEPCID) 20 MG tablet TAKE 1 TABLET(20 MG) BY MOUTH TWICE DAILY   fluticasone (FLONASE) 50 MCG/ACT nasal spray as needed.   levothyroxine (SYNTHROID) 50 MCG tablet TAKE 1 TABLET(50 MCG) BY MOUTH DAILY BEFORE BREAKFAST   losartan (COZAAR) 25 MG tablet Take 1 tablet (25 mg total) by mouth daily.   montelukast (SINGULAIR) 10 MG tablet TAKE 1 TABLET(10 MG) BY MOUTH DAILY   nitroGLYCERIN (NITROSTAT) 0.4 MG SL tablet Place 1 tablet (0.4 mg total) under the tongue every 5 (five) minutes as needed for chest pain.   ondansetron  (ZOFRAN-ODT) 4 MG disintegrating tablet Take 1 tablet (4 mg total) by mouth every 8 (eight) hours as needed for nausea or vomiting (for nausea from wegovy or other source).   pantoprazole (PROTONIX) 40 MG tablet TAKE 1 TABLET(40 MG) BY MOUTH TWICE DAILY   polyethylene glycol powder (GLYCOLAX/MIRALAX) 17 GM/SCOOP powder Take 1 dose daily. May increase to BID if needed (Patient taking differently: Take 17 g by mouth daily as needed for moderate constipation.)   potassium chloride (KLOR-CON M) 10 MEQ tablet TAKE 1 TABLET(10 MEQ) BY MOUTH DAILY   sucralfate (CARAFATE) 1 g tablet TAKE 1 TABLET(1 GRAM) BY MOUTH TWICE DAILY. MAY INCREASE TO 4 TIMES DAILY FOR ANY ACUTE SYMPTOMS   [DISCONTINUED] tirzepatide (ZEPBOUND) 5 MG/0.5ML Pen Inject 5 mg into the skin once a week.   Facility-Administered Medications Prior to Visit  Medication Dose Route Frequency Provider   technetium tetrofosmin (TC-MYOVIEW) injection 29.8 millicurie  29.8 millicurie Intravenous Once PRN Chrystie Nose, MD         Clinical Data Analysis:   Physical Exam  BP 137/75 (BP Location: Left Arm, Patient Position: Sitting)   Pulse 60   Temp 98.7 F (37.1 C) (Temporal)   Ht 5\' 11"  (1.803 m)   Wt (!) 374 lb (169.6 kg)   SpO2 98%   BMI 52.16 kg/m  Wt Readings from Last 10 Encounters:  05/04/23 (!) 374 lb (169.6 kg)  04/30/23 (!) 371 lb 4.8 oz (168.4 kg)  04/16/23 (!) 360 lb (163.3 kg)  04/15/23 (!) 366 lb 6.4 oz (166.2 kg)  04/03/23 (!) 362 lb 3.2 oz (164.3 kg)  03/02/23 (!) 360 lb 6.4 oz (163.5 kg)  02/17/23 (!) 362 lb (164.2 kg)  01/26/23 (!) 363 lb 14.4 oz (165.1 kg)  01/26/23 (!) 363 lb 12.8 oz (165 kg)  12/30/22 (!) 367 lb 12.8 oz (166.8 kg)   Vital signs reviewed.  Nursing notes reviewed. Weight trend reviewed. Abnormalities and Problem-Specific physical exam findings:  marked  truncal adiposity   General Appearance:  No acute distress appreciable.   Well-groomed, healthy-appearing male.   Good muscle tone. Skin:  Clear and well-hydrated. Pulmonary:  Normal work of breathing at rest, no respiratory distress apparent. SpO2: 98 %  Musculoskeletal: All extremities are intact.  Neurological:  Awake, alert, oriented, and engaged.  No obvious focal neurological deficits or cognitive impairments.  Sensorium seems unclouded.   Speech is clear and coherent with logical content. Psychiatric:  Appropriate mood, pleasant and cooperative demeanor, thoughtful and engaged during the exam  Results Reviewed:    No results found for any visits on 05/04/23.  Recent Results (from the past 2160 hour(s))  Lab report - scanned     Status: None   Collection Time: 03/02/23  2:51 PM  Result Value Ref Range   EGFR 27.0     Comment: Abstracted by HIM   Creatinine, POC 95.3 mg/dL   Protein/Creatinine Ratio 1,735    Microalb Creat Ratio 933    Albumin, Urine POC 889.1   Lab report - scanned     Status: None   Collection Time: 03/11/23  9:13 AM  Result Value Ref Range   EGFR 27.0     Comment: Abstracted by HIM  CBC and differential     Status: Abnormal   Collection Time: 04/03/23 12:00 AM  Result Value Ref Range   Hemoglobin 12.9 (A) 13.5 - 17.5   HCT 38 (A) 41 - 53   Neutrophils Absolute 2.34    Platelets 61 (A) 150 - 400 K/uL   WBC 5.2   CBC     Status: Abnormal   Collection Time: 04/03/23 12:00 AM  Result Value Ref Range   RBC 3.83 (A) 3.87 - 5.11  Beta 2 microglobulin, serum     Status: Abnormal   Collection Time: 04/03/23  4:09 PM  Result Value Ref Range   Beta-2 Microglobulin 7.1 (H) 0.6 - 2.4 mg/L    Comment: (NOTE) Siemens Immulite 2000 Immunochemiluminometric assay (ICMA) Values obtained with different assay methods or kits cannot be used interchangeably. Results cannot be interpreted as absolute evidence of the presence or absence of malignant disease. Performed At: [facility] [address] [place], [place] [id number] Jolene Schimke MD [id number]   Lactate dehydrogenase     Status:  None   Collection Time: 04/03/23  4:09 PM  Result Value Ref Range   LDH 155 98 - 192 U/L    Comment: Performed at [facility], [address] [place], [place] [address]  Kappa/lambda light chains     Status: Abnormal   Collection Time: 04/03/23  4:09 PM  Result Value Ref Range   Kappa free light chain 473.5 (H) 3.3 - 19.4 mg/L   Lambda free light chains 29.4 (H) 5.7 - 26.3 mg/L   Kappa, lambda light chain ratio 16.11 (H) 0.26 - 1.65    Comment: (NOTE) Performed At: [facility] [address] [place], [place] [id number] Jolene Schimke MD [id number]   Multiple Myeloma Panel (SPEP&IFE w/QIG)     Status: Abnormal   Collection Time: 04/03/23  4:09 PM  Result Value Ref Range   IgG (Immunoglobin G), Serum 603 603 - 1,613 mg/dL   IgA 1,884 (H) 90 - 166 mg/dL    Comment: (NOTE) Results confirmed on dilution.    IgM (Immunoglobulin M), Srm 100 20 - 172 mg/dL   Total Protein ELP 6.0 6.0 - 8.5 g/dL   Albumin SerPl Elph-Mcnc 3.0 2.9 - 4.4 g/dL  Alpha 1 0.3 0.0 - 0.4 g/dL   Alpha2 Glob SerPl Elph-Mcnc 0.6 0.4 - 1.0 g/dL   B-Globulin SerPl Elph-Mcnc 1.4 (H) 0.7 - 1.3 g/dL   Gamma Glob SerPl Elph-Mcnc 0.6 0.4 - 1.8 g/dL   M Protein SerPl Elph-Mcnc 0.9 (H) Not Observed g/dL   Globulin, Total 3.0 2.2 - 3.9 g/dL   Albumin/Glob SerPl 1.1 0.7 - 1.7   IFE 1 Comment (A)     Comment: (NOTE) Immunofixation shows IgA monoclonal protein with kappa light chain specificity.    Please Note Comment     Comment: (NOTE) Protein electrophoresis scan will follow via computer, mail, or courier delivery. Performed At: [facility] [address] [place], [place] [id number] Jolene Schimke MD [id number]   CMP (Cancer Center only)     Status: Abnormal   Collection Time: 04/03/23  4:09 PM  Result Value Ref Range   Sodium 137 135 - 145 mmol/L   Potassium 3.9 3.5 - 5.1 mmol/L   Chloride 109 98 - 111 mmol/L   CO2 20 (L) 22 - 32 mmol/L   Glucose, Bld 95 70 - 99  mg/dL    Comment: Glucose reference range applies only to samples taken after fasting for at least 8 hours.   BUN 39 (H) 6 - 20 mg/dL   Creatinine 7.84 (H) 6.96 - 1.24 mg/dL   Calcium 8.6 (L) 8.9 - 10.3 mg/dL   Total Protein 6.4 (L) 6.5 - 8.1 g/dL   Albumin 2.9 (L) 3.5 - 5.0 g/dL   AST 53 (H) 15 - 41 U/L   ALT 64 (H) 0 - 44 U/L   Alkaline Phosphatase 195 (H) 38 - 126 U/L   Total Bilirubin 0.8 0.3 - 1.2 mg/dL   GFR, Estimated 24 (L) >60 mL/min    Comment: (NOTE) Calculated using the CKD-EPI Creatinine Equation (2021)    Anion gap 8 5 - 15    Comment: Performed at [facility], [address] [place], [place] [address]  POCT INR     Status: None   Collection Time: 04/10/23 12:00 AM  Result Value Ref Range   INR 1.00 0.80 - 1.20  CBC and differential     Status: Abnormal   Collection Time: 04/10/23 12:00 AM  Result Value Ref Range   Hemoglobin 12.8 (A) 13.5 - 17.5   HCT 39 (A) 41 - 53   Neutrophils Absolute 2.65    Platelets 72 (A) 150 - 400 K/uL   WBC 5.2   CBC     Status: None   Collection Time: 04/10/23 12:00 AM  Result Value Ref Range   RBC 3.92 3.87 - 5.11  Protime-INR     Status: None   Collection Time: 04/10/23 12:00 AM  Result Value Ref Range   Protime 10.3 10.0 - 13.8  Basic metabolic panel     Status: Abnormal   Collection Time: 04/10/23 12:00 AM  Result Value Ref Range   Glucose 104    BUN 33 (A) 4 - 21   CO2 23 (A) 13 - 22   Creatinine 3.0 (A) 0.6 - 1.3   Potassium 4.5 3.5 - 5.1 mEq/L   Sodium 143 137 - 147   Chloride 111 (A) 99 - 108   EGFR 22.0     Comment: abstracted by HIM  Comprehensive metabolic panel     Status: Abnormal   Collection Time: 04/10/23 12:00 AM  Result Value Ref Range   Calcium 9.2 8.7 - 10.7  Albumin 3.4 (A) 3.5 - 5.0  Hepatic function panel     Status: Abnormal   Collection Time: 04/10/23 12:00 AM  Result Value Ref Range   Alkaline Phosphatase 227 (A) 25 - 125   ALT 74 (A) 10 - 40 U/L   AST 60 (A) 14 - 40    Bilirubin, Total 0.7   Surgical pathology     Status: None   Collection Time: 04/10/23 12:00 AM  Result Value Ref Range   SURGICAL PATHOLOGY      Surgical Pathology CASE: [id number] PATIENT: Shaft G[facility]re[facility] Bone Marrow Report     Clinical History: Monoclonal paraproteinemia     DIAGNOSIS:  BONE MARROW, ASPIRATE, CLOT, CORE: -Slightly hypercellular bone marrow for age with plasma cell neoplasm -See comment  PERIPHERAL BLOOD: -Mild macrocytic anemia -Thrombocytopenia  COMMENT:  The bone marrow is slightly hypercellular for age with increased number of plasma cells representing 7% of all cells in the aspirate with lack of large aggregates or sheets.  The plasma cells display kappa light chain restriction consistent with plasma cell neoplasm.  The background shows trilineage hematopoiesis with scattering of atypical megakaryocytes, possibly secondary in nature especially in the setting of ITP, kidney disease, etc.  Nonetheless, correlation with cytogenetic and FISH studies is recommended.  MICROSCOPIC DESCRIPTION:  PERIPHERAL BLOOD SMEAR: The red blood cells display mild anisopoikilocytosis wit h mild polychromasia.  The white blood cells are normal in number with scattered neutrophils displaying mild toxic granulation.  The platelets are decreased in number.  BONE MARROW ASPIRATE: Bone marrow particles present Erythroid precursors: Progressive maturation with scattered late precursors displaying nuclear cytoplasmic dyssynchrony or irregular nuclei. Granulocytic precursors: Orderly and progressive maturation for the most part.  Scattered mature neutrophils display mild toxic granulation. Megakaryocytes: Abundant with scattered small and/or hypolobated forms or large hyperchromatic cells. Lymphocytes/plasma cells: The plasma cells represent 7% of all cells with lack of large aggregates or sheets.  Many of the plasma cells display atypical cytologic features  with cytomegaly and/or small nucleoli.  Large lymphoid aggregates are not seen.  TOUCH PREPARATIONS: A mixture of cell types present  CLOT AND BIOPSY: The sections show 60 to 70% cellularity with a mixtur e of cell types including abundant megakaryocytes.  Scattered small plasma cell clusters are present.  Significant lymphoid aggregates are not seen.  Immunohistochemical stain for CD138 and in situ hybridization for kappa and lambda were performed on blocks B1 and C1 with appropriate controls.  CD138 highlights the plasma cell component consisting of interstitial cells and small clusters and displays kappa light chain restriction.  IRON STAIN: Iron stains are performed on a bone marrow aspirate or touch imprint smear and section of clot. The controls stained appropriately.       Storage Iron: Present      Ring Sideroblasts: Absent  ADDITIONAL DATA/TESTING: The specimen was sent for cytogenetic analysis and FISH for multiple myeloma and a separate report will follow.  CELL COUNT DATA:  Bone Marrow count performed on 500 cells shows: Blasts:   0%   Myeloid:  51% Promyelocytes: 0%   Erythroid:     35% Myelocytes:    15%  Lymphocytes:   6% Metamyelocytes:     3%   P lasma cells:  7% Bands:    7% Neutrophils:   20%  M:E ratio:     1.5 Eosinophils:   6% Basophils:     0% Monocytes:     1%  Lab Data: CBC performed on 04/10/2023 shows: WBC:  5.2 k/uL  Neutrophils:   52% Hgb: 12.8 g/dL Lymphocytes:   40% HCT: 38.7 %    Monocytes:     12% MCV: 99 fL     Eosinophils:   8% RDW: 14.5 %    Basophils:     1% PLT: 72 k/uL    GROSS DESCRIPTION:  Specimen A: Aspirate smear.  Specimen B: Received in B-plus is a 0.4 x 0.3 x 0.1 cm aggregate of dark red soft material, submitted 1 block.  Specimen C: Received in B-plus is a 1.7 x 0.25 cm core of tan-white to dark red firm tissue, submitted in 1 block following decalcification with Immunocal.  SW 04/10/2023   Final Diagnosis  performed by Guerry Bruin, MD.   Electronically signed 04/14/2023 Technical and / or Professional components performed at [facility], [address] [place], [place] [address].  Immunohistochemistry Technical component (if applicable) was perfo rmed at [facility]. [address], [place], [place] [address].   IMMUNOHISTOCHEMISTRY DISCLAIMER (if applicable): Some of these immunohistochemical stains may have been developed and the performance characteristics determine by [facility]. Some may not have been cleared or approved by the U.S. Food and Drug Administration. The FDA has determined that such clearance or approval is not necessary. This test is used for clinical purposes. It should not be regarded as investigational or for research. This laboratory is certified under the Clinical Laboratory Improvement Amendments of 1988 (CLIA-88) as qualified to perform high complexity clinical laboratory testing.  The controls stained appropriately.   ECHOCARDIOGRAM COMPLETE     Status: None   Collection Time: 04/16/23 10:47 AM  Result Value Ref Range   Weight 5,760 oz   Height 71 in   Area-P 1/2 3.88 cm2   S' Lateral 3.20 cm   Est EF 50 - 55%   MYOCARDIAL PERFUSION IMAGING     Status: None   Collection Time: 04/17/23  1:14 PM  Result Value Ref Range   Rest HR 52.0 bpm   Rest BP 133/69 mmHg   Peak HR 73 bpm   Peak BP 138/64 mmHg   Stress Nuclear Isotope Dose 32.0 mCi   Rest Nuclear Isotope Dose 30.9 mCi   SSS 14.0    SRS 6.0    SDS 8.0    TID 1.00    LV sys vol 86.0 mL   LV dias vol 199.0 62 - 150 mL   Nuc Stress EF 57 %   ST Depression (mm) 0 mm  Lab report - scanned     Status: None   Collection Time: 04/19/23 10:28 AM  Result Value Ref Range   Creatinine, POC 57.7 mg/dL    Comment: Abstracted by HIM    No image results found.   MYOCARDIAL PERFUSION IMAGING  Result Date: 04/17/2023   Findings are consistent with  no ischemia. The study is low risk.   No ST deviation was noted.   LV perfusion is normal.   Left ventricular function is normal. Nuclear stress EF: 57 %. The left ventricular ejection fraction is normal (55-65%). End diastolic cavity size is moderately enlarged. End systolic cavity size is moderately enlarged.   Prior study available for comparison from 10/04/2021. Abnormal perfusion. Apical to mid inferior fixed defect and apical to mid anterior wall defect, both thought to be artifact. LVEF 47%. Large size, moderate intensity fixed anterior and inferior perfusion defects without reversible ischemia, consistent with artifact. LVEF 57%, moderately dilated LV with normal  wall motion. This is a low risk study. Compared to a prior study in 2022, the perfusion defects are unchanged and favored to be artifact. The LVEF has improved.   ECHOCARDIOGRAM COMPLETE  Result Date: 04/16/2023    ECHOCARDIOGRAM REPORT   Patient Name:   [facility] Date of Exam: 04/16/2023 Medical Rec #:  [id number]           Height:       71.0 in Accession #:    [id number]          Weight:       360.0 lb Date of Birth:  1971-08-27           BSA:          2.707 m Patient Age:    52 years            BP:           150/72 mmHg Patient Gender: M                   HR:           61 bpm. Exam Location:  [address] Procedure: 2D Echo, Cardiac Doppler, Color Doppler and Intracardiac            Opacification Agent Indications:    I25.10 Coronary artery disease  History:        Patient has prior history of Echocardiogram examinations, most                 recent 03/31/2016. CAD, Prior CABG; Risk Factors:Hypertension.                 Obstructive sleep apnea-CPAP Chronic combined systolic and                 diastolic heart failure. Chronic kidney disease.  Sonographer:    Daphine Deutscher RDCS Referring Phys: [id number] [facility] A CHANDRASEKHAR  Sonographer Comments: Technically difficult study due to poor echo windows. IMPRESSIONS  1. Left  ventricular ejection fraction, by estimation, is 50 to 55%. The left ventricle has low normal function. The left ventricle has no regional wall motion abnormalities. Left ventricular diastolic parameters were normal.  2. Right ventricular systolic function is normal. The right ventricular size is mildly enlarged.  3. Left atrial size was mildly dilated.  4. The mitral valve is normal in structure. No evidence of mitral valve regurgitation. No evidence of mitral stenosis.  5. The aortic valve is tricuspid. Aortic valve regurgitation is not visualized. No aortic stenosis is present.  6. Aortic dilatation noted. There is mild dilatation of the ascending aorta, measuring 42 mm.  7. The inferior vena cava is normal in size with greater than 50% respiratory variability, suggesting right atrial pressure of 3 mmHg. FINDINGS  Left Ventricle: Left ventricular ejection fraction, by estimation, is 50 to 55%. The left ventricle has low normal function. The left ventricle has no regional wall motion abnormalities. Definity contrast agent was given IV to delineate the left ventricular endocardial borders. The left ventricular internal cavity size was normal in size. There is no left ventricular hypertrophy. Left ventricular diastolic parameters were normal. Right Ventricle: The right ventricular size is mildly enlarged. No increase in right ventricular wall thickness. Right ventricular systolic function is normal. Left Atrium: Left atrial size was mildly dilated. Right Atrium: Right atrial size was normal in size. Pericardium: There is no evidence of pericardial effusion. Mitral Valve: The mitral valve is normal in structure. No evidence of  mitral valve regurgitation. No evidence of mitral valve stenosis. Tricuspid Valve: The tricuspid valve is normal in structure. Tricuspid valve regurgitation is not demonstrated. No evidence of tricuspid stenosis. Aortic Valve: The aortic valve is tricuspid. Aortic valve regurgitation is not  visualized. No aortic stenosis is present. Pulmonic Valve: The pulmonic valve was normal in structure. Pulmonic valve regurgitation is not visualized. No evidence of pulmonic stenosis. Aorta: The aortic root is normal in size and structure and aortic dilatation noted. There is mild dilatation of the ascending aorta, measuring 42 mm. Venous: The inferior vena cava is normal in size with greater than 50% respiratory variability, suggesting right atrial pressure of 3 mmHg. IAS/Shunts: No atrial level shunt detected by color flow Doppler.  LEFT VENTRICLE PLAX 2D LVIDd:         4.50 cm   Diastology LVIDs:         3.20 cm   LV e' medial:    7.47 cm/s LV PW:         1.00 cm   LV E/e' medial:  10.8 LV IVS:        1.00 cm   LV e' lateral:   9.19 cm/s LVOT diam:     2.50 cm   LV E/e' lateral: 8.7 LV SV:         77 LV SV Index:   28 LVOT Area:     4.91 cm  RIGHT VENTRICLE RV Basal diam:  5.00 cm RV S prime:     12.70 cm/s TAPSE (M-mode): 3.6 cm LEFT ATRIUM             Index        RIGHT ATRIUM           Index LA diam:        4.40 cm 1.63 cm/m   RA Area:     18.90 cm LA Vol (A2C):   54.8 ml 20.24 ml/m  RA Volume:   61.70 ml  22.79 ml/m LA Vol (A4C):   92.6 ml 34.21 ml/m LA Biplane Vol: 77.8 ml 28.74 ml/m  AORTIC VALVE LVOT Vmax:   77.40 cm/s LVOT Vmean:  49.550 cm/s LVOT VTI:    0.156 m  AORTA Ao Root diam: 3.70 cm Ao Asc diam:  4.20 cm MITRAL VALVE MV Area (PHT): 3.88 cm    SHUNTS MV Decel Time: 196 msec    Systemic VTI:  0.16 m MV E velocity: 80.40 cm/s  Systemic Diam: 2.50 cm MV A velocity: 57.60 cm/s MV E/A ratio:  1.40 Donato Schultz MD Electronically signed by Donato Schultz MD Signature Date/Time: 04/16/2023/12:31:40 PM    Final        Signed: Lula Olszewski, MD 05/04/2023 12:57 PM

## 2023-05-04 NOTE — Assessment & Plan Note (Addendum)
Confirmed he has follow up with hepatologist, Dr. Orvan Falconer leaving No results found for: "GGT" so will order Also fib-4 N/A will update for upcoming liver specialist

## 2023-05-04 NOTE — Assessment & Plan Note (Signed)
Defer to nephrology and oncology (mgus associated chronic kidney disease) Encouraged to drink plenty of fluids, avoid NSAIDs

## 2023-05-04 NOTE — Assessment & Plan Note (Addendum)
We are having challenges getting zepbound but reordered and I offered to fight for prior authorization.  Will try topiramate if unable to get zepbound

## 2023-05-05 LAB — NASH FIBROSURE(R) PLUS

## 2023-05-05 LAB — T4F: T4,Free (Direct): 0.93 ng/dL (ref 0.82–1.77)

## 2023-05-05 LAB — FIB-4 W/RX NASH FIBROSURE PLUS: AST: 36 IU/L (ref 0–40)

## 2023-05-05 LAB — TSH RFX ON ABNORMAL TO FREE T4: TSH: 5.66 u[IU]/mL — ABNORMAL HIGH (ref 0.450–4.500)

## 2023-05-09 LAB — NASH FIBROSURE(R) PLUS
ALT (SGPT) P5P: 45 IU/L (ref 0–55)
AST (SGOT) P5P: 34 IU/L (ref 0–40)
Apolipoprotein A-1: 144 mg/dL (ref 101–178)
Cholesterol, Total: 187 mg/dL (ref 100–199)
Fibrosis Score: 0.24 — ABNORMAL HIGH (ref 0.00–0.21)
GGT: 334 IU/L — ABNORMAL HIGH (ref 0–65)
Glucose: 93 mg/dL (ref 70–99)
Haptoglobin: 96 mg/dL (ref 23–355)
NASH Score: 0.55 — ABNORMAL HIGH (ref 0.00–0.25)
Steatosis Score: 0.76 — ABNORMAL HIGH (ref 0.00–0.40)
Triglycerides: 136 mg/dL (ref 0–149)

## 2023-05-09 LAB — FIB-4 W/RX NASH FIBROSURE PLUS
ALT: 39 IU/L (ref 0–44)
FIB-4 Index: 5.24 — ABNORMAL HIGH (ref 0.00–2.67)
Platelets: 55 10*3/uL — CL (ref 150–450)

## 2023-05-10 NOTE — Progress Notes (Signed)
These tests show no active liver damage this month so something you did seemed to help, maybe.  The chronic liver disease and low platelets are unchanged.  These tests will be visible to the Liver Clinic at [facility], where Dr. Orvan Falconer has asked that you follow for your liver function going forward.

## 2023-05-11 ENCOUNTER — Other Ambulatory Visit (HOSPITAL_COMMUNITY): Payer: Self-pay

## 2023-05-11 ENCOUNTER — Telehealth: Payer: Self-pay | Admitting: Internal Medicine

## 2023-05-11 ENCOUNTER — Ambulatory Visit: Payer: BC Managed Care – PPO | Admitting: Oncology

## 2023-05-11 NOTE — Telephone Encounter (Signed)
Pt is requesting to be informed once Zebound 5 mg are approved

## 2023-05-12 ENCOUNTER — Encounter: Payer: Self-pay | Admitting: Oncology

## 2023-05-15 ENCOUNTER — Other Ambulatory Visit (HOSPITAL_COMMUNITY): Payer: Self-pay

## 2023-05-22 ENCOUNTER — Telehealth: Payer: Self-pay

## 2023-05-22 ENCOUNTER — Other Ambulatory Visit (HOSPITAL_COMMUNITY): Payer: Self-pay

## 2023-05-22 NOTE — Telephone Encounter (Signed)
Patient states insurance denied the zepbound 5 mg due to them stating this was for weight loss program and they don't cover weight loss. Pt states he was able to get 2.5 mg approved so he is confused. Pt is requesting an appeal be placed. Please Advise.

## 2023-05-22 NOTE — Telephone Encounter (Signed)
Pharmacy Patient Advocate Encounter   Received notification that prior authorization for Zepbound is required/requested.   PA submitted to [employer] via CoverMyMeds Key  # [id number] Status is pending  Per test claim, weight loss drug are not covered. Placed a call to the insurance and per the representative weight loss drugs are not covered but a prior authorization has to be submitted and denied before we can submit an appeal.

## 2023-05-24 ENCOUNTER — Other Ambulatory Visit: Payer: Self-pay | Admitting: Internal Medicine

## 2023-05-24 DIAGNOSIS — I2510 Atherosclerotic heart disease of native coronary artery without angina pectoris: Secondary | ICD-10-CM

## 2023-05-24 DIAGNOSIS — I10 Essential (primary) hypertension: Secondary | ICD-10-CM

## 2023-05-24 DIAGNOSIS — E7849 Other hyperlipidemia: Secondary | ICD-10-CM

## 2023-05-25 NOTE — Telephone Encounter (Signed)
Pharmacy Patient Advocate Encounter  Received notification from [facility] that the request for prior authorization for Zepbound has been denied due to this health benefit plan does not cover the following services, supplies, drugs or charges: any treatment or regimen medical or surgical, for the purpose of reducing or controlling the weight of the member, or for the treatment of obesity, except for surgical treatment of morbid obesity, or as specifically covered by this health benefit plan..    Please be advised we currently do not have a Pharmacist to review denials, therefore you will need to process appeals accordingly as needed. Thanks for your support at this time.   You may fax appeal letter, appeal form and any other clinical info to [phone], to appeal.   Denial letter indexed to chart.

## 2023-05-26 ENCOUNTER — Other Ambulatory Visit: Payer: Self-pay | Admitting: Internal Medicine

## 2023-05-26 DIAGNOSIS — R7303 Prediabetes: Secondary | ICD-10-CM

## 2023-05-26 MED ORDER — METFORMIN HCL 500 MG PO TABS
500.0000 mg | ORAL_TABLET | Freq: Two times a day (BID) | ORAL | 3 refills | Status: DC
Start: 2023-05-26 — End: 2023-06-17

## 2023-05-26 NOTE — Telephone Encounter (Signed)
Called patient back  he stated that  he is not sure what medication to take,  regarding the metformin he said that his father and buddy of his that has been on it for years have not loss a pound. He said that one time you told it was bad for his kidney that why you put him on jandiance , he said he would like for you call you him back regarding this or come in for an appt

## 2023-05-26 NOTE — Telephone Encounter (Signed)
I spoke w/ pt regarding going to Zepbound wedsite for discounts and coupon. Sent pt an my chart asking  if he had medication that he would like to try , also suggested for to reach out to his insurance company to see what medication they are willing to pay for.

## 2023-05-26 NOTE — Telephone Encounter (Signed)
Called and spoke with patient to let know  that his insurance denied this medication. Also advised pt to go their website to see if coupon or discount on there for this medication.Did want change medication for patient ,please advise

## 2023-05-26 NOTE — Telephone Encounter (Signed)
Called and spoke w/ patient to let him know  that an rx was sent in for metformin , pt stated that he didn't want to take this medication.

## 2023-05-26 NOTE — Telephone Encounter (Signed)
Forwarding to Dr Theodore Demark

## 2023-05-29 DIAGNOSIS — I129 Hypertensive chronic kidney disease with stage 1 through stage 4 chronic kidney disease, or unspecified chronic kidney disease: Secondary | ICD-10-CM | POA: Diagnosis not present

## 2023-05-29 DIAGNOSIS — R7303 Prediabetes: Secondary | ICD-10-CM | POA: Diagnosis not present

## 2023-05-29 DIAGNOSIS — N184 Chronic kidney disease, stage 4 (severe): Secondary | ICD-10-CM | POA: Diagnosis not present

## 2023-05-29 DIAGNOSIS — I480 Paroxysmal atrial fibrillation: Secondary | ICD-10-CM | POA: Diagnosis not present

## 2023-05-30 LAB — LAB REPORT - SCANNED
Creatinine, POC: 95.2 mg/dL
EGFR: 18

## 2023-06-01 ENCOUNTER — Telehealth: Payer: Self-pay | Admitting: *Deleted

## 2023-06-01 ENCOUNTER — Other Ambulatory Visit (HOSPITAL_COMMUNITY): Payer: Self-pay | Admitting: Nephrology

## 2023-06-01 DIAGNOSIS — N184 Chronic kidney disease, stage 4 (severe): Secondary | ICD-10-CM

## 2023-06-01 DIAGNOSIS — R809 Proteinuria, unspecified: Secondary | ICD-10-CM

## 2023-06-01 NOTE — Telephone Encounter (Signed)
Patient with diagnosis of afib on Eliquis for anticoagulation.    Procedure: US KIDNEY Bx  Date of procedure: 06/18/23   CHA2DS2-VASc Score = 3   This indicates a 3.2% annual risk of stroke. The patient's score is based upon: CHF History: 1 HTN History: 1 Diabetes History: 0 Stroke History: 0 Vascular Disease History: 1 Age Score: 0 Gender Score: 0      CrCl 46 ml/min  Per office protocol, patient can hold Eliquis for 2 days prior to procedure.    **This guidance is not considered finalized until pre-operative APP has relayed final recommendations.**

## 2023-06-01 NOTE — Telephone Encounter (Signed)
  Tried to call the pt on [phone] but vm not set up. I will try alt # [phone]. I was able to s/w the pt and he has been scheduled for tele pre op appt 06/05/23 @ 2 pm per the pt's request for tele appt to be this week based on his schedule. Med rec and consent are done.             Patient Consent for Virtual Visit        Sean [name] has provided verbal consent on 06/01/2023 for a virtual visit (video or telephone).   CONSENT FOR VIRTUAL VISIT FOR:  Sean [name]  By participating in this virtual visit I agree to the following:  I hereby voluntarily request, consent and authorize [facility] and its employed or contracted physicians, physician assistants, nurse practitioners or other licensed health care professionals (the Practitioner), to provide me with telemedicine health care services (the "Services") as deemed necessary by the treating Practitioner. I acknowledge and consent to receive the Services by the Practitioner via telemedicine. I understand that the telemedicine visit will involve communicating with the Practitioner through live audiovisual communication technology and the disclosure of certain medical information by electronic transmission. I acknowledge that I have been given the opportunity to request an in-person assessment or other available alternative prior to the telemedicine visit and am voluntarily participating in the telemedicine visit.  I understand that I have the right to withhold or withdraw my consent to the use of telemedicine in the course of my care at any time, without affecting my right to future care or treatment, and that the Practitioner or I may terminate the telemedicine visit at any time. I understand that I have the right to inspect all information obtained and/or recorded in the course of the telemedicine visit and may receive copies of available information for a reasonable fee.  I understand that some of the potential  risks of receiving the Services via telemedicine include:  Delay or interruption in medical evaluation due to technological equipment failure or disruption; Information transmitted may not be sufficient (e.g. poor resolution of images) to allow for appropriate medical decision making by the Practitioner; and/or  In rare instances, security protocols could fail, causing a breach of personal health information.  Furthermore, I acknowledge that it is my responsibility to provide information about my medical history, conditions and care that is complete and accurate to the best of my ability. I acknowledge that Practitioner's advice, recommendations, and/or decision may be based on factors not within their control, such as incomplete or inaccurate data provided by me or distortions of diagnostic images or specimens that may result from electronic transmissions. I understand that the practice of medicine is not an exact science and that Practitioner makes no warranties or guarantees regarding treatment outcomes. I acknowledge that a copy of this consent can be made available to me via my patient portal ([facility] My[facility]), or I can request a printed copy by calling the office of [facility].    I understand that my insurance will be billed for this visit.   I have read or had this consent read to me. I understand the contents of this consent, which adequately explains the benefits and risks of the Services being provided via telemedicine.  I have been provided ample opportunity to ask questions regarding this consent and the Services and have had my questions answered to my satisfaction. I give my informed consent for the   services to be provided through the use of telemedicine in my medical care    

## 2023-06-01 NOTE — Telephone Encounter (Signed)
Tried to call the pt on [phone] but vm not set up. I will try alt # [phone]. I was able to s/w the pt and he has been scheduled for tele pre op appt 06/05/23 @ 2 pm per the pt's request for tele appt to be this week based on his schedule. Med rec and consent are done.             Patient Consent for Virtual Visit        Sean [name] has provided verbal consent on 06/01/2023 for a virtual visit (video or telephone).   CONSENT FOR VIRTUAL VISIT FOR:  Sean [name]  By participating in this virtual visit I agree to the following:  I hereby voluntarily request, consent and authorize [facility] and its employed or contracted physicians, physician assistants, nurse practitioners or other licensed health care professionals (the Practitioner), to provide me with telemedicine health care services (the "Services") as deemed necessary by the treating Practitioner. I acknowledge and consent to receive the Services by the Practitioner via telemedicine. I understand that the telemedicine visit will involve communicating with the Practitioner through live audiovisual communication technology and the disclosure of certain medical information by electronic transmission. I acknowledge that I have been given the opportunity to request an in-person assessment or other available alternative prior to the telemedicine visit and am voluntarily participating in the telemedicine visit.  I understand that I have the right to withhold or withdraw my consent to the use of telemedicine in the course of my care at any time, without affecting my right to future care or treatment, and that the Practitioner or I may terminate the telemedicine visit at any time. I understand that I have the right to inspect all information obtained and/or recorded in the course of the telemedicine visit and may receive copies of available information for a reasonable fee.  I understand that some of the potential  risks of receiving the Services via telemedicine include:  Delay or interruption in medical evaluation due to technological equipment failure or disruption; Information transmitted may not be sufficient (e.g. poor resolution of images) to allow for appropriate medical decision making by the Practitioner; and/or  In rare instances, security protocols could fail, causing a breach of personal health information.  Furthermore, I acknowledge that it is my responsibility to provide information about my medical history, conditions and care that is complete and accurate to the best of my ability. I acknowledge that Practitioner's advice, recommendations, and/or decision may be based on factors not within their control, such as incomplete or inaccurate data provided by me or distortions of diagnostic images or specimens that may result from electronic transmissions. I understand that the practice of medicine is not an exact science and that Practitioner makes no warranties or guarantees regarding treatment outcomes. I acknowledge that a copy of this consent can be made available to me via my patient portal [facility] MyChart), or I can request a printed copy by calling the office of [facility].    I understand that my insurance will be billed for this visit.   I have read or had this consent read to me. I understand the contents of this consent, which adequately explains the benefits and risks of the Services being provided via telemedicine.  I have been provided ample opportunity to ask questions regarding this consent and the Services and have had my questions answered to my satisfaction. I give my informed consent for the  services to be provided through the use of telemedicine in my medical care

## 2023-06-01 NOTE — Telephone Encounter (Signed)
Pharmacy please advise on holding Eliquis prior to ultrasound-guided kidney biopsy scheduled for 06/18/2023. Thank you.

## 2023-06-01 NOTE — Telephone Encounter (Signed)
   Pre-operative Risk Assessment    Patient Name: Sean [name]  DOB: Jun 29, 1971 MRN: [id number]      Request for Surgical Clearance    Procedure:   US KIDNEY Bx  Date of Surgery:  Clearance 06/18/23                                 Surgeon:  NOT LISTED  Surgeon's Group or Practice Name:  [facility]  Phone number:  [phone] Fax number:  [phone] ATTN: Archie Patten CRAWFORD   Type of Clearance Requested:   - Medical  - Pharmacy:  Hold Apixaban (Eliquis)     Type of Anesthesia:   MODERATE SEDATION   Additional requests/questions:    Elpidio Anis   06/01/2023, 3:06 PM

## 2023-06-01 NOTE — Telephone Encounter (Signed)
   Name: Sean [name]  DOB: 08/08/1971  MRN: [id number]  Primary Cardiologist: Christell Constant, MD   Preoperative team, please contact this patient and set up a phone call appointment for further preoperative risk assessment. Please obtain consent and complete medication review. Thank you for your help.  I confirm that guidance regarding antiplatelet and oral anticoagulation therapy has been completed and, if necessary, noted below.  Per office protocol, patient can hold Eliquis for 2 days prior to procedure.    Napoleon Form, Leodis Rains, NP 06/01/2023, 4:43 PM [facility]

## 2023-06-05 ENCOUNTER — Ambulatory Visit: Payer: BC Managed Care – PPO | Attending: Cardiovascular Disease | Admitting: Student

## 2023-06-05 DIAGNOSIS — Z01810 Encounter for preprocedural cardiovascular examination: Secondary | ICD-10-CM

## 2023-06-05 NOTE — Progress Notes (Signed)
Virtual Visit via Telephone Note   Because of Sean [name]'s co-morbid illnesses, he is at least at moderate risk for complications without adequate follow up.  This format is felt to be most appropriate for this patient at this time.  The patient did not have access to video technology/had technical difficulties with video requiring transitioning to audio format only (telephone).  All issues noted in this document were discussed and addressed.  No physical exam could be performed with this format.  Please refer to the patient's chart for his consent to telehealth for Sean [name].  Evaluation Performed:  Preoperative cardiovascular risk assessment _____________   Date:  06/05/2023   Patient ID:  Sean [name], DOB [birth year], MRN [id number] Patient Location:  Home Provider location:   Office  Primary Care Provider:  Lula Olszewski, MD Primary Cardiologist:  Sean Constant, MD  Chief Complaint / Patient Profile   52 y.o. y/o male with a h/o CAD s/p CABG x 1 2017 and low risk nuclear stress test May 2024, PAF/aflutter on anticoagulation, ischemic cardiomyopathy with recovered LV function, hypertension, hyperlipidemia, OSA, GERD, hypothyroid, NASH who is pending US kidney biopsy by [name] and presents today for telephonic preoperative cardiovascular risk assessment.  History of Present Illness    Sean [name] is a 52 y.o. male who presents via audio/video conferencing for a telehealth visit today.  Pt was last seen in cardiology clinic on 12/30/2022 by Dr. Izora [name].  At that time Sean [name] was doing well.  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 The patient is now pending procedure as outlined above. Since his last visit, he is doing well from a cardiac standpoint. Patient denies shortness of breath or dyspnea on exertion. No chest pain, pressure, or tightness. Denies orthopnea or PND. He has  stable lower extremity edema that he manages with compression socks. No palpitations. He does not follow a formal exercise program. He is independent with all ADLs. He is able to perform light, moderate and heavy household activities.     Past Medical History    Past Medical History:  Diagnosis Date   Abnormal liver enzymes    a. Sees a doctor in [place].   Cardiac cirrhosis    a. possible elevated LFTs/low platelets felt due to cardiac cirrhosis per 2017 admission.   Chronic combined systolic and diastolic CHF (congestive heart failure) (HCC)    CKD (chronic kidney disease) stage 3, GFR 30-59 ml/min (HCC) 01/04/2016   Lab Results  Component  Value  Date/Time     GFR  29.24 (L)  09/30/2022 02:51 PM     GFR  27.27 (L)  09/24/2022 03:40 PM     GFR  24.10 (L)  09/03/2022 03:45 PM     GFR  18.98 (L)  08/25/2022 12:21 PM     GFR  38.98 (L)  04/24/2022 02:39 PM         CKD (chronic kidney disease), stage II    Stage 4   Congenital heart defect    a. rightward rotation of heart, almost dextrocardia   Coronary artery disease    a. s/p CABGx1 in 12/2015.   Gastric polyps 11/25/2022   Gastritis and gastroduodenitis 11/25/2022          Gastroesophageal reflux disease with esophagitis and hemorrhage 04/04/2021   Hematuria    a. Chronic hx of this, no prior etiology determined through workup per patient.   History  of umbilical hernia repair 07/10/2011   History of hernia surgery twice prior   Hypercholesteremia    a. Prev taken off statin due to abnormal liver function.   Hypertension    Incisional hernia 07/10/2011   History of hernia surgery twice prior   Morbid obesity (HCC)    Morbid obesity with BMI of 50.0-59.9, adult (HCC) 01/04/2016   After [facility] doctor is setting him up with a wellness clinic to help with weight losS  He reports not having tried anything for weight loss either diet or or medicine or surgery in the past  We failed to get [facility] 10/2022      Wt Readings from Last 10  Encounters:  11/13/22  (!) 384 lb 12.8 oz (174.5 kg)  10/13/22  (!) 385 lb (174.6 kg)  09/30/22  (!) 384 lb (174.2 kg)  09/24/22  (!) 382 lb (1   Nausea and vomiting 11/25/2022   OSA on CPAP    Paroxysmal atrial flutter (HCC) 02/12/2016   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   LIMA to LAD   Sinus bradycardia    Thrombocytopenia (HCC)    Past Surgical History:  Procedure Laterality Date   APPENDECTOMY     BIOPSY  02/19/2021   Procedure: BIOPSY;  Surgeon: Sean Danas, MD;  Location: [facility];  Service: Gastroenterology;;   BIOPSY  11/25/2022   Procedure: BIOPSY;  Surgeon: Sean Danas, MD;  Location: [facility] ENDOSCOPY;  Service: Gastroenterology;;   CARDIAC CATHETERIZATION N/A 12/24/2015   Procedure: Right/Left Heart Cath and Coronary Angiography;  Surgeon: Sean Patty, MD;  Location: [name] INVASIVE CV LAB;  Service: Cardiovascular;  Laterality: N/A;   CARDIOVERSION N/A 02/14/2016   Procedure: CARDIOVERSION;  Surgeon: Sean Nose, MD;  Location: [name];  Service: Cardiovascular;  Laterality: N/A;   COLONOSCOPY WITH PROPOFOL N/A 02/19/2021   Procedure: COLONOSCOPY WITH PROPOFOL;  Surgeon: Sean Danas, MD;  Location: [facility] ENDOSCOPY;  Service: Gastroenterology;  Laterality: N/A;   CORONARY ARTERY BYPASS GRAFT N/A 12/26/2015   Procedure: Off Pump Coronary Artery Bypass Grafting times one using left internal mammary artery;  Surgeon: Sean Nails, MD;  Location: [facility] OR;  Service: Open Heart Surgery;  Laterality: N/A;   ESOPHAGOGASTRODUODENOSCOPY (EGD) WITH PROPOFOL N/A 02/19/2021   Procedure: ESOPHAGOGASTRODUODENOSCOPY (EGD) WITH PROPOFOL;  Surgeon: Sean Danas, MD;  Location: [facility] ENDOSCOPY;  Service: Gastroenterology;  Laterality: N/A;   ESOPHAGOGASTRODUODENOSCOPY (EGD) WITH PROPOFOL N/A 11/25/2022   Procedure: ESOPHAGOGASTRODUODENOSCOPY (EGD) WITH PROPOFOL;  Surgeon: Sean Danas, MD;  Location: [facility] ENDOSCOPY;  Service: Gastroenterology;  Laterality: N/A;    GALLBLADDER SURGERY     HERNIA REPAIR     LEFT HEART CATH AND CORS/GRAFTS ANGIOGRAPHY N/A 04/15/2017   Procedure: Left Heart Cath and Cors/Grafts Angiography;  Surgeon: Lennette Bihari, MD;  Location: [name] INVASIVE CV LAB;  Service: Cardiovascular;  Laterality: N/A;   LEFT HEART CATH AND CORS/GRAFTS ANGIOGRAPHY N/A 11/02/2020   Procedure: LEFT HEART CATH AND CORS/GRAFTS ANGIOGRAPHY;  Surgeon: Tonny Bollman, MD;  Location: [facility] INVASIVE CV LAB;  Service: Cardiovascular;  Laterality: N/A;   POLYPECTOMY  02/19/2021   Procedure: POLYPECTOMY;  Surgeon: Sean Danas, MD;  Location: [facility] ENDOSCOPY;  Service: Gastroenterology;;   TEE WITHOUT CARDIOVERSION N/A 12/26/2015   Procedure: TRANSESOPHAGEAL ECHOCARDIOGRAM (TEE);  Surgeon: Sean Nails, MD;  Location: [name] OR;  Service: Open Heart Surgery;  Laterality: N/A;   TEE WITHOUT CARDIOVERSION N/A 02/14/2016   Procedure: TRANSESOPHAGEAL ECHOCARDIOGRAM (TEE);  Surgeon: Sean Nose, MD;  Location: [facility]  ENDOSCOPY;  Service: Cardiovascular;  Laterality: N/A;    Allergies  Allergies  Allergen Reactions   Doxycycline Shortness Of Breath   Imdur [Isosorbide Dinitrate] Diarrhea    Pt states causes "diarrhea."   Valtrex [Valacyclovir] Itching   Augmentin [Amoxicillin-Pot Clavulanate] Itching   Tape Rash    Home Medications    Prior to Admission medications   Medication Sig Start Date End Date Taking? Authorizing Provider  acetaminophen (TYLENOL) 500 MG tablet Take 500-1,500 mg by mouth as needed for moderate pain, fever, headache or mild pain.    [provider]  albuterol (VENTOLIN HFA) 108 (90 Base) MCG/ACT inhaler Inhale into the lungs. 07/22/21   [provider]  allopurinol (ZYLOPRIM) 100 MG tablet TAKE 1 TABLET(100 MG) BY MOUTH DAILY 04/27/23   Sean Olszewski, MD  amitriptyline (ELAVIL) 25 MG tablet TAKE 1 TABLET(25 MG) BY MOUTH AT BEDTIME 03/24/23   Sean Danas, MD  apixaban (ELIQUIS) 5 MG TABS tablet Take 1 tablet (5 mg  total) by mouth 2 (two) times daily. 11/26/22   Sean Danas, MD  azelastine (ASTELIN) 0.1 % nasal spray Place 2 sprays into both nostrils 2 (two) times daily. Patient taking differently: Place 2 sprays into both nostrils 2 (two) times daily. As needed. 02/04/22   Ardith Dark, MD  Cyanocobalamin (VITAMIN B-12) 5000 MCG SUBL Place 5,000 mcg under the tongue daily.    [provider]  diltiazem (CARDIZEM SR) 120 MG 12 hr capsule TAKE 1 CAPSULE(120 MG) BY MOUTH TWICE DAILY 02/12/23   Sean Olszewski, MD  empagliflozin (JARDIANCE) 10 MG TABS tablet Take 1 tablet (10 mg total) by mouth daily before breakfast. 10/13/22   Sean Olszewski, MD  Evolocumab (REPATHA SURECLICK) 140 MG/ML SOAJ INJECT THE CONTENTS OF 1 SYRINGE UNDER THE SKIN EVERY 14 DAYS 12/17/22   Meriam Sprague, MD  famotidine (PEPCID) 20 MG tablet TAKE 1 TABLET(20 MG) BY MOUTH TWICE DAILY 03/24/23   Sean Danas, MD  fluticasone (FLONASE) 50 MCG/ACT nasal spray as needed. 04/04/21   [provider]  levothyroxine (SYNTHROID) 50 MCG tablet TAKE 1 TABLET(50 MCG) BY MOUTH DAILY BEFORE BREAKFAST 10/15/22   Sean Olszewski, MD  losartan (COZAAR) 25 MG tablet Take 1 tablet (25 mg total) by mouth daily. Patient not taking: Reported on 06/01/2023 11/13/22   Sean Olszewski, MD  metFORMIN (GLUCOPHAGE) 500 MG tablet Take 1 tablet (500 mg total) by mouth 2 (two) times daily with a meal. Patient not taking: Reported on 06/01/2023 05/26/23   Sean Olszewski, MD  montelukast (SINGULAIR) 10 MG tablet TAKE 1 TABLET(10 MG) BY MOUTH DAILY Patient not taking: Reported on 06/01/2023 05/24/23   Sean Olszewski, MD  nitroGLYCERIN (NITROSTAT) 0.4 MG SL tablet Place 1 tablet (0.4 mg total) under the tongue every 5 (five) minutes as needed for chest pain. Patient not taking: Reported on 06/01/2023 02/10/22   Riley Lam A, MD  ondansetron (ZOFRAN-ODT) 4 MG disintegrating tablet Take 1 tablet (4 mg total) by mouth every 8 (eight)  hours as needed for nausea or vomiting (for nausea from wegovy or other source). 01/26/23   Sean Olszewski, MD  pantoprazole (PROTONIX) 40 MG tablet TAKE 1 TABLET(40 MG) BY MOUTH TWICE DAILY 03/24/23   Sean Olszewski, MD  polyethylene glycol powder (GLYCOLAX/MIRALAX) 17 GM/SCOOP powder Take 1 dose daily. May increase to BID if needed Patient taking differently: Take 17 g by mouth daily as needed for moderate constipation. 02/26/22   Sean Danas,  MD  potassium chloride (KLOR-CON M) 10 MEQ tablet TAKE 1 TABLET(10 MEQ) BY MOUTH DAILY Patient not taking: Reported on 06/01/2023 05/24/23   Sean Olszewski, MD  sucralfate (CARAFATE) 1 g tablet TAKE 1 TABLET(1 GRAM) BY MOUTH TWICE DAILY. MAY INCREASE TO 4 TIMES DAILY FOR ANY ACUTE SYMPTOMS Patient taking differently: PT STATES HE IS ONLY TAKING ONCE A DAY 06/01/23 11/26/22   Sean Danas, MD  tirzepatide (ZEPBOUND) 2.5 MG/0.5ML Pen Inject 2.5 mg into the skin once a week. Patient not taking: Reported on 06/01/2023 05/04/23   Sean Olszewski, MD  tirzepatide [facility]) 5 MG/0.5ML Pen Inject 5 mg into the skin once a week. Patient not taking: Reported on 06/01/2023 05/04/23   Sean Olszewski, MD    Physical Exam    Vital Signs:  Sean [name] does not have vital signs available for review today.  Given telephonic nature of communication, physical exam is limited. AAOx3. NAD. Normal affect.  Speech and respirations are unlabored.  Accessory Clinical Findings    None  Assessment & Plan    Primary Cardiologist: Sean Constant, MD  Preoperative cardiovascular risk assessment. US kidney biopsy by [facility].  Chart reviewed as part of pre-operative protocol coverage. According to the RCRI, patient has a 6.6% risk of MACE. Patient reports activity equivalent to 6.61 METS (per DASI).   Given past medical history and time since last visit, based on ACC/AHA guidelines, Sean [name] would be at acceptable risk for the  planned procedure without further cardiovascular testing.   Patient was advised that if he develops new symptoms prior to surgery to contact our office to arrange a follow-up appointment.  he verbalized understanding.  Per pharm D, may hold Eliquis 2 days prior to procedure.   I will route this recommendation to the requesting party via Epic fax function.  Please call with questions.  Time:   Today, I have spent 6 minutes with the patient with telehealth technology discussing medical history, symptoms, and management plan.     Carlos Levering, NP  06/05/2023, 8:55 AM

## 2023-06-12 DIAGNOSIS — D693 Immune thrombocytopenic purpura: Secondary | ICD-10-CM | POA: Diagnosis not present

## 2023-06-12 DIAGNOSIS — K7581 Nonalcoholic steatohepatitis (NASH): Secondary | ICD-10-CM | POA: Diagnosis not present

## 2023-06-12 DIAGNOSIS — R748 Abnormal levels of other serum enzymes: Secondary | ICD-10-CM | POA: Diagnosis not present

## 2023-06-15 ENCOUNTER — Other Ambulatory Visit (HOSPITAL_COMMUNITY): Payer: Self-pay | Admitting: Nurse Practitioner

## 2023-06-15 ENCOUNTER — Telehealth (HOSPITAL_COMMUNITY): Payer: Self-pay

## 2023-06-15 DIAGNOSIS — R748 Abnormal levels of other serum enzymes: Secondary | ICD-10-CM

## 2023-06-15 DIAGNOSIS — K7581 Nonalcoholic steatohepatitis (NASH): Secondary | ICD-10-CM

## 2023-06-15 NOTE — Telephone Encounter (Signed)
-----   Message from Durwin Glaze sent at 06/15/2023  3:46 PM EDT ----- Regarding: RE: Transjugular liver biopsy Prob better different days due to bleed risk(s) ----- Message ----- From: Sharee Pimple Sent: 06/15/2023   3:36 PM EDT To: Oley Balm, MD Subject: RE: Transjugular liver biopsy                  Deanne Coffer,   He has a kidney biopsy scheduled this Thursday. Can they both be done on the same day or is that too high risk?  Thanks,  Fara Boros ----- Message ----- From: Oley Balm, MD Sent: 06/15/2023   3:09 PM EDT To: Sharee Pimple Subject: RE: Transjugular liver biopsy                  Ok DDH ----- Message ----- From: Sharee Pimple Sent: 06/15/2023   2:22 PM EDT To: Ir Procedure Requests Subject: Transjugular liver biopsy                      Procedure: Transjugular liver biopsy with hepatic venous pressure measurements  Ordering: Annamarie Major, NP [phone]  Dx: Elevated liver enzymes, metabolic dysfunction associated steatohepatitis   Imaging: Ultrasound in Epic and more imaging in [facility].   Please review.  Thanks,  Fara Boros

## 2023-06-15 NOTE — Telephone Encounter (Signed)
-----   Message from Durwin Glaze sent at 06/15/2023  2:39 PM EDT ----- Regarding: RE: Transjugular liver biopsy Ok DDH ----- Message ----- From: Sharee Pimple Sent: 06/15/2023   2:22 PM EDT To: Ir Procedure Requests Subject: Transjugular liver biopsy                      Procedure: Transjugular liver biopsy with hepatic venous pressure measurements  Ordering: Annamarie Major, NP [phone]  Dx: Elevated liver enzymes, metabolic dysfunction associated steatohepatitis   Imaging: Ultrasound in Epic and more imaging in [facility].   Please review.  Thanks,  Fara Boros

## 2023-06-17 ENCOUNTER — Other Ambulatory Visit: Payer: Self-pay

## 2023-06-17 ENCOUNTER — Encounter (HOSPITAL_COMMUNITY): Payer: Self-pay

## 2023-06-17 ENCOUNTER — Observation Stay (HOSPITAL_COMMUNITY)
Admission: AD | Admit: 2023-06-17 | Discharge: 2023-06-19 | Disposition: A | Discharge status: Home / Self Care | Payer: BC Managed Care – PPO | Source: Ambulatory Visit | Attending: Internal Medicine | Admitting: Internal Medicine

## 2023-06-17 ENCOUNTER — Other Ambulatory Visit: Payer: Self-pay | Admitting: Student

## 2023-06-17 ENCOUNTER — Encounter (HOSPITAL_COMMUNITY): Payer: Self-pay | Admitting: Internal Medicine

## 2023-06-17 DIAGNOSIS — I480 Paroxysmal atrial fibrillation: Secondary | ICD-10-CM | POA: Insufficient documentation

## 2023-06-17 DIAGNOSIS — Z7901 Long term (current) use of anticoagulants: Secondary | ICD-10-CM | POA: Insufficient documentation

## 2023-06-17 DIAGNOSIS — E1122 Type 2 diabetes mellitus with diabetic chronic kidney disease: Secondary | ICD-10-CM | POA: Diagnosis not present

## 2023-06-17 DIAGNOSIS — I5022 Chronic systolic (congestive) heart failure: Secondary | ICD-10-CM | POA: Diagnosis not present

## 2023-06-17 DIAGNOSIS — R944 Abnormal results of kidney function studies: Secondary | ICD-10-CM | POA: Diagnosis not present

## 2023-06-17 DIAGNOSIS — Z6841 Body Mass Index (BMI) 40.0 and over, adult: Secondary | ICD-10-CM | POA: Insufficient documentation

## 2023-06-17 DIAGNOSIS — N179 Acute kidney failure, unspecified: Secondary | ICD-10-CM

## 2023-06-17 DIAGNOSIS — N184 Chronic kidney disease, stage 4 (severe): Secondary | ICD-10-CM | POA: Insufficient documentation

## 2023-06-17 DIAGNOSIS — Z79899 Other long term (current) drug therapy: Secondary | ICD-10-CM | POA: Diagnosis not present

## 2023-06-17 DIAGNOSIS — I130 Hypertensive heart and chronic kidney disease with heart failure and stage 1 through stage 4 chronic kidney disease, or unspecified chronic kidney disease: Secondary | ICD-10-CM | POA: Diagnosis not present

## 2023-06-17 DIAGNOSIS — R7303 Prediabetes: Secondary | ICD-10-CM

## 2023-06-17 DIAGNOSIS — R809 Proteinuria, unspecified: Secondary | ICD-10-CM | POA: Diagnosis not present

## 2023-06-17 DIAGNOSIS — D472 Monoclonal gammopathy: Secondary | ICD-10-CM | POA: Diagnosis present

## 2023-06-17 DIAGNOSIS — Z7984 Long term (current) use of oral hypoglycemic drugs: Secondary | ICD-10-CM | POA: Diagnosis not present

## 2023-06-17 DIAGNOSIS — Z951 Presence of aortocoronary bypass graft: Secondary | ICD-10-CM | POA: Diagnosis not present

## 2023-06-17 DIAGNOSIS — D696 Thrombocytopenia, unspecified: Secondary | ICD-10-CM | POA: Diagnosis present

## 2023-06-17 DIAGNOSIS — I2510 Atherosclerotic heart disease of native coronary artery without angina pectoris: Secondary | ICD-10-CM | POA: Diagnosis not present

## 2023-06-17 HISTORY — DX: Acute kidney failure, unspecified: N17.9

## 2023-06-17 LAB — CBC
HCT: 36.3 % — ABNORMAL LOW (ref 39.0–52.0)
Hemoglobin: 12 g/dL — ABNORMAL LOW (ref 13.0–17.0)
MCH: 33.7 pg (ref 26.0–34.0)
MCHC: 33.1 g/dL (ref 30.0–36.0)
MCV: 102 fL — ABNORMAL HIGH (ref 80.0–100.0)
Platelets: 63 10*3/uL — ABNORMAL LOW (ref 150–400)
RBC: 3.56 MIL/uL — ABNORMAL LOW (ref 4.22–5.81)
RDW: 14.2 % (ref 11.5–15.5)
WBC: 5 10*3/uL (ref 4.0–10.5)
nRBC: 0 % (ref 0.0–0.2)

## 2023-06-17 LAB — BASIC METABOLIC PANEL
Anion gap: 8 (ref 5–15)
BUN: 36 mg/dL — ABNORMAL HIGH (ref 6–20)
CO2: 24 mmol/L (ref 22–32)
Calcium: 9 mg/dL (ref 8.9–10.3)
Chloride: 110 mmol/L (ref 98–111)
Creatinine, Ser: 3.35 mg/dL — ABNORMAL HIGH (ref 0.61–1.24)
GFR, Estimated: 21 mL/min — ABNORMAL LOW (ref >60)
Glucose, Bld: 120 mg/dL — ABNORMAL HIGH (ref 70–99)
Potassium: 4.1 mmol/L (ref 3.5–5.1)
Sodium: 142 mmol/L (ref 135–145)

## 2023-06-17 LAB — TYPE AND SCREEN
ABO/RH(D): A POS
Antibody Screen: NEGATIVE

## 2023-06-17 LAB — GLUCOSE, CAPILLARY
Glucose-Capillary: 129 mg/dL — ABNORMAL HIGH (ref 70–99)
Glucose-Capillary: 106 mg/dL — ABNORMAL HIGH (ref 70–99)

## 2023-06-17 LAB — HIV ANTIBODY (ROUTINE TESTING W REFLEX): HIV Screen 4th Generation wRfx: NONREACTIVE

## 2023-06-17 MED ORDER — ACETAMINOPHEN 325 MG PO TABS
650.0000 mg | ORAL_TABLET | Freq: Four times a day (QID) | ORAL | Status: DC | PRN
  Administered 2023-06-18: 650 mg via ORAL
  Filled 2023-06-17: qty 2

## 2023-06-17 MED ORDER — ALBUTEROL SULFATE (2.5 MG/3ML) 0.083% IN NEBU: 2.5000 mg | INHALATION_SOLUTION | Freq: Four times a day (QID) | RESPIRATORY_TRACT | Status: DC | PRN

## 2023-06-17 MED ORDER — INSULIN ASPART 100 UNIT/ML IJ SOLN
0.0000 [IU] | Freq: Three times a day (TID) | INTRAMUSCULAR | Status: DC
  Administered 2023-06-17 – 2023-06-18 (×2): 3 [IU] via SUBCUTANEOUS

## 2023-06-17 MED ORDER — DILTIAZEM HCL ER 60 MG PO CP12
120.0000 mg | ORAL_CAPSULE | Freq: Two times a day (BID) | ORAL | Status: DC
  Administered 2023-06-17 – 2023-06-19 (×4): 120 mg via ORAL
  Filled 2023-06-17 (×5): qty 2

## 2023-06-17 MED ORDER — LEVOTHYROXINE SODIUM 50 MCG PO TABS
50.0000 ug | ORAL_TABLET | Freq: Every day | ORAL | Status: DC
  Administered 2023-06-18 – 2023-06-19 (×2): 50 ug via ORAL
  Filled 2023-06-17 (×2): qty 1

## 2023-06-17 MED ORDER — ONDANSETRON 4 MG PO TBDP: 4.0000 mg | ORAL_TABLET | Freq: Three times a day (TID) | ORAL | Status: DC | PRN

## 2023-06-17 MED ORDER — POLYETHYLENE GLYCOL 3350 17 GM/SCOOP PO POWD: 17.0000 g | Freq: Every day | ORAL | Status: DC | PRN

## 2023-06-17 MED ORDER — PANTOPRAZOLE SODIUM 40 MG PO TBEC
40.0000 mg | DELAYED_RELEASE_TABLET | Freq: Two times a day (BID) | ORAL | Status: DC
  Administered 2023-06-17 – 2023-06-19 (×4): 40 mg via ORAL
  Filled 2023-06-17 (×4): qty 1

## 2023-06-17 MED ORDER — FAMOTIDINE 20 MG PO TABS
20.0000 mg | ORAL_TABLET | Freq: Two times a day (BID) | ORAL | Status: DC
  Administered 2023-06-17 – 2023-06-19 (×4): 20 mg via ORAL
  Filled 2023-06-17 (×4): qty 1

## 2023-06-17 MED ORDER — AMITRIPTYLINE HCL 50 MG PO TABS
25.0000 mg | ORAL_TABLET | Freq: Every day | ORAL | Status: DC
  Administered 2023-06-17 – 2023-06-18 (×2): 25 mg via ORAL
  Filled 2023-06-17 (×2): qty 1

## 2023-06-17 MED ORDER — LOSARTAN POTASSIUM 50 MG PO TABS
25.0000 mg | ORAL_TABLET | Freq: Every day | ORAL | Status: DC
  Administered 2023-06-18 – 2023-06-19 (×2): 25 mg via ORAL
  Filled 2023-06-17 (×3): qty 1

## 2023-06-17 MED ORDER — ALLOPURINOL 100 MG PO TABS
100.0000 mg | ORAL_TABLET | Freq: Every day | ORAL | Status: DC
  Administered 2023-06-18 – 2023-06-19 (×2): 100 mg via ORAL
  Filled 2023-06-17 (×3): qty 1

## 2023-06-17 MED ORDER — SUCRALFATE 1 G PO TABS
1.0000 g | ORAL_TABLET | Freq: Four times a day (QID) | ORAL | Status: DC
  Administered 2023-06-17 – 2023-06-19 (×4): 1 g via ORAL
  Filled 2023-06-17 (×5): qty 1

## 2023-06-17 NOTE — H&P (Addendum)
History and Physical    Sean [name] ZOX:[id number] DOB: 03-04-1971 DOA: 06/17/2023  PCP: Sean Olszewski, MD (Confirm with patient/family/NH records and if not entered, this has to be entered at [facility] point of entry) Patient coming from: Home  I have personally briefly reviewed patient's old medical records in [facility]  Chief Complaint: Feel fine  HPI: Sean [name] is a 52 y.o. male with medical history significant of CKD stage IIIb, MGUS with chronic thrombocytopenia, CAD status post CABG, chronic HFrEF with recovered LVEF, PAF on Eliquis, HTN, HLD, IIDM, sent from nephrologist office for elective kidney biopsy tomorrow.  Patient has no complaints.  Patient has a history of MGUS with chronic thrombocytopenia underwent bone biopsy May 2024.  He is not on any chemotherapy.  Recently patient has worsening of kidney function in the last 8 months with trending up up creatinines from 2.3 to 3.06 weeks ago, and nephrology concern about renal amyloidosis and requested IR guided biopsy scheduled for tomorrow.  However given the patient history of chronic thrombocytopenia, nephrology requested that patient admitted today and check platelet count, for possible platelet apheresis for the incoming IR procedure.  Most recent platelet count 55 on May 10. Patient's last dose of Eliquis was Monday evening.  He denies any easy bruising or bleeding.   Review of Systems: As per HPI otherwise 14 point review of systems negative.    Past Medical History:  Diagnosis Date   Abnormal liver enzymes    a. Sees a doctor in [place].   Cardiac cirrhosis    a. possible elevated LFTs/low platelets felt due to cardiac cirrhosis per 2017 admission.   Chronic combined systolic and diastolic CHF (congestive heart failure) (HCC)    CKD (chronic kidney disease) stage 3, GFR 30-59 ml/min (HCC) 01/04/2016   Lab Results  Component  Value  Date/Time     GFR  29.24 (L)  09/30/2022 02:51 PM     GFR  27.27 (L)   09/24/2022 03:40 PM     GFR  24.10 (L)  09/03/2022 03:45 PM     GFR  18.98 (L)  08/25/2022 12:21 PM     GFR  38.98 (L)  04/24/2022 02:39 PM         CKD (chronic kidney disease), stage II    Stage 4   Congenital heart defect    a. rightward rotation of heart, almost dextrocardia   Coronary artery disease    a. s/p CABGx1 in 12/2015.   Gastric polyps 11/25/2022   Gastritis and gastroduodenitis 11/25/2022          Gastroesophageal reflux disease with esophagitis and hemorrhage 04/04/2021   Hematuria    a. Chronic hx of this, no prior etiology determined through workup per patient.   History of umbilical hernia repair 07/10/2011   History of hernia surgery twice prior   Hypercholesteremia    a. Prev taken off statin due to abnormal liver function.   Hypertension    Incisional hernia 07/10/2011   History of hernia surgery twice prior   Morbid obesity (HCC)    Morbid obesity with BMI of 50.0-59.9, adult (HCC) 01/04/2016   After [place] [facility] doctor is setting him up with a wellness clinic to help with weight losS  He reports not having tried anything for weight loss either diet or or medicine or surgery in the past  We failed to get [facility] 10/[facility]      Wt Readings from Last 10 Encounters:  11/13/22  Marland Kitchen)  384 lb 12.8 oz (174.5 kg)  10/13/22  (!) 385 lb (174.6 kg)  09/30/22  (!) 384 lb (174.2 kg)  09/24/22  (!) 382 lb (1   Nausea and vomiting 11/25/2022   OSA on CPAP    Paroxysmal atrial flutter (HCC) 02/12/2016   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   LIMA to LAD   Sinus bradycardia    Thrombocytopenia (HCC)     Past Surgical History:  Procedure Laterality Date   APPENDECTOMY     BIOPSY  02/19/2021   Procedure: BIOPSY;  Surgeon: Tressia Danas, MD;  Location: [facility] ENDOSCOPY;  Service: Gastroenterology;;   BIOPSY  11/25/2022   Procedure: BIOPSY;  Surgeon: Tressia Danas, MD;  Location: [facility]OS[facility];  Service: Gastroenterology;;   CARDIAC CATHETERIZATION N/A 12/24/2015   Procedure:  Right/Left Heart Cath and Coronary Angiography;  Surgeon: Dolores Patty, MD;  Location: [facility] INVASIVE CV LAB;  Service: Cardiovascular;  Laterality: N/A;   CARDIOVERSION N/A 02/14/2016   Procedure: CARDIOVERSION;  Surgeon: Chrystie Nose, MD;  Location: [facility] ENDOSCOPY;  Service: Cardiovascular;  Laterality: N/A;   COLONOSCOPY WITH PROPOFOL N/A 02/19/2021   Procedure: COLONOSCOPY WITH PROPOFOL;  Surgeon: Tressia Danas, MD;  Location: [facility] ENDOSCOPY;  Service: Gastroenterology;  Laterality: N/A;   CORONARY ARTERY BYPASS GRAFT N/A 12/26/2015   Procedure: Off Pump Coronary Artery Bypass Grafting times one using left internal mammary artery;  Surgeon: Purcell Nails, MD;  Location: [facility] OR;  Service: Open Heart Surgery;  Laterality: N/A;   ESOPHAGOGASTRODUODENOSCOPY (EGD) WITH PROPOFOL N/A 02/19/2021   Procedure: ESOPHAGOGASTRODUODENOSCOPY (EGD) WITH PROPOFOL;  Surgeon: Tressia Danas, MD;  Location: [facility] ENDOSCOPY;  Service: Gastroenterology;  Laterality: N/A;   ESOPHAGOGASTRODUODENOSCOPY (EGD) WITH PROPOFOL N/A 11/25/2022   Procedure: ESOPHAGOGASTRODUODENOSCOPY (EGD) WITH PROPOFOL;  Surgeon: Tressia Danas, MD;  Location: [facility] ENDOSCOPY;  Service: Gastroenterology;  Laterality: N/A;   GALLBLADDER SURGERY     HERNIA REPAIR     LEFT HEART CATH AND CORS/GRAFTS ANGIOGRAPHY N/A 04/15/2017   Procedure: Left Heart Cath and Cors/Grafts Angiography;  Surgeon: Lennette Bihari, MD;  Location: [facility] INVASIVE CV LAB;  Service: Cardiovascular;  Laterality: N/A;   LEFT HEART CATH AND CORS/GRAFTS ANGIOGRAPHY N/A 11/02/2020   Procedure: LEFT HEART CATH AND CORS/GRAFTS ANGIOGRAPHY;  Surgeon: Tonny Bollman, MD;  Location: [facility] INVASIVE CV LAB;  Service: Cardiovascular;  Laterality: N/A;   POLYPECTOMY  02/19/2021   Procedure: POLYPECTOMY;  Surgeon: Tressia Danas, MD;  Location: [facility] ENDOSCOPY;  Service: Gastroenterology;;   TEE WITHOUT CARDIOVERSION N/A 12/26/2015   Procedure: TRANSESOPHAGEAL ECHOCARDIOGRAM (TEE);   Surgeon: Purcell Nails, MD;  Location: [facility] OR;  Service: Open Heart Surgery;  Laterality: N/A;   TEE WITHOUT CARDIOVERSION N/A 02/14/2016   Procedure: TRANSESOPHAGEAL ECHOCARDIOGRAM (TEE);  Surgeon: Chrystie Nose, MD;  Location: [facility] ENDOSCOPY;  Service: Cardiovascular;  Laterality: N/A;     reports that he has never smoked. He has never used smokeless tobacco. He reports current alcohol use. He reports that he does not use drugs.  Allergies  Allergen Reactions   Doxycycline Shortness Of Breath   Imdur [Isosorbide Dinitrate] Diarrhea    Pt states causes "diarrhea."   Valtrex [Valacyclovir] Itching   Augmentin [Amoxicillin-Pot Clavulanate] Itching   Tape Rash    Family History  Problem Relation Age of Onset   CAD Father        4 stents ~ 19   Diabetes Father    Diabetes Maternal Grandfather    Diabetes Maternal Uncle    Heart attack Other  Hyperlipidemia Other      Prior to Admission medications   Medication Sig Start Date End Date Taking? Authorizing Provider  acetaminophen (TYLENOL) 500 MG tablet Take 500-1,500 mg by mouth as needed for moderate pain, fever, headache or mild pain.    [provider]  albuterol (VENTOLIN HFA) 108 (90 Base) MCG/ACT inhaler Inhale into the lungs. 07/22/21   [provider]  allopurinol (ZYLOPRIM) 100 MG tablet TAKE 1 TABLET(100 MG) BY MOUTH DAILY 04/27/23   Sean Olszewski, MD  amitriptyline (ELAVIL) 25 MG tablet TAKE 1 TABLET(25 MG) BY MOUTH AT BEDTIME 03/24/23   Tressia Danas, MD  apixaban (ELIQUIS) 5 MG TABS tablet Take 1 tablet (5 mg total) by mouth 2 (two) times daily. 11/26/22   Tressia Danas, MD  azelastine (ASTELIN) 0.1 % nasal spray Place 2 sprays into both nostrils 2 (two) times daily. Patient taking differently: Place 2 sprays into both nostrils 2 (two) times daily. As needed. 02/04/22   Ardith Dark, MD  Cyanocobalamin (VITAMIN B-12) 5000 MCG SUBL Place 5,000 mcg under the tongue daily.    [provider]  diltiazem (CARDIZEM SR) 120 MG 12 hr capsule TAKE 1 CAPSULE(120 MG) BY MOUTH TWICE DAILY 02/12/23   Sean Olszewski, MD  empagliflozin (JARDIANCE) 10 MG TABS tablet Take 1 tablet (10 mg total) by mouth daily before breakfast. 10/13/22   Sean Olszewski, MD  Evolocumab (REPATHA SURECLICK) 140 MG/ML SOAJ INJECT THE CONTENTS OF 1 SYRINGE UNDER THE SKIN EVERY 14 DAYS 12/17/22   Meriam Sprague, MD  famotidine (PEPCID) 20 MG tablet TAKE 1 TABLET(20 MG) BY MOUTH TWICE DAILY 03/24/23   Tressia Danas, MD  fluticasone (FLONASE) 50 MCG/ACT nasal spray as needed. 04/04/21   [provider]  levothyroxine (SYNTHROID) 50 MCG tablet TAKE 1 TABLET(50 MCG) BY MOUTH DAILY BEFORE BREAKFAST 10/15/22   Sean Olszewski, MD  losartan (COZAAR) 25 MG tablet Take 1 tablet (25 mg total) by mouth daily. Patient not taking: Reported on 06/01/2023 11/13/22   Sean Olszewski, MD  metFORMIN (GLUCOPHAGE) 500 MG tablet Take 1 tablet (500 mg total) by mouth 2 (two) times daily with a meal. Patient not taking: Reported on 06/01/2023 05/26/23   Sean Olszewski, MD  montelukast (SINGULAIR) 10 MG tablet TAKE 1 TABLET(10 MG) BY MOUTH DAILY Patient not taking: Reported on 06/01/2023 05/24/23   Sean Olszewski, MD  nitroGLYCERIN (NITROSTAT) 0.4 MG SL tablet Place 1 tablet (0.4 mg total) under the tongue every 5 (five) minutes as needed for chest pain. Patient not taking: Reported on 06/01/2023 02/10/22   Riley Lam A, MD  ondansetron (ZOFRAN-ODT) 4 MG disintegrating tablet Take 1 tablet (4 mg total) by mouth every 8 (eight) hours as needed for nausea or vomiting (for nausea from wegovy or other source). 01/26/23   Sean Olszewski, MD  pantoprazole (PROTONIX) 40 MG tablet TAKE 1 TABLET(40 MG) BY MOUTH TWICE DAILY 03/24/23   Sean Olszewski, MD  polyethylene glycol powder (GLYCOLAX/MIRALAX) 17 GM/SCOOP powder Take 1 dose daily. May increase to BID if needed Patient taking differently: Take 17 g by mouth  daily as needed for moderate constipation. 02/26/22   Tressia Danas, MD  potassium chloride (KLOR-CON M) 10 MEQ tablet TAKE 1 TABLET(10 MEQ) BY MOUTH DAILY Patient not taking: Reported on 06/01/2023 05/24/23   Sean Olszewski, MD  sucralfate (CARAFATE) 1 g tablet TAKE 1 TABLET(1 GRAM) BY MOUTH TWICE DAILY. MAY INCREASE TO 4 TIMES DAILY FOR ANY  ACUTE SYMPTOMS Patient taking differently: PT STATES HE IS ONLY TAKING ONCE A DAY 06/01/23 11/26/22   Tressia Danas, MD  tirzepatide (ZEPBOUND) 2.5 MG/0.5ML Pen Inject 2.5 mg into the skin once a week. Patient not taking: Reported on 06/01/2023 05/04/23   Sean Olszewski, MD  tirzepatide [facility]) 5 MG/0.5ML Pen Inject 5 mg into the skin once a week. Patient not taking: Reported on 06/01/2023 05/04/23   Sean Olszewski, MD    Physical Exam: Vitals:   06/17/23 1425  BP: (!) 145/62  Pulse: (!) 56  Resp: 16  Temp: 98.4 F (36.9 C)  SpO2: 97%  Weight: (!) 171.1 kg  Height: 5\' 11"  (1.803 m)    Constitutional: NAD, calm, comfortable Vitals:   06/17/23 1425  BP: (!) 145/62  Pulse: (!) 56  Resp: 16  Temp: 98.4 F (36.9 C)  SpO2: 97%  Weight: (!) 171.1 kg  Height: 5\' 11"  (1.803 m)   Eyes: PERRL, lids and conjunctivae normal ENMT: Mucous membranes are moist. Posterior pharynx clear of any exudate or lesions.Normal dentition.  Neck: normal, supple, no masses, no thyromegaly Respiratory: clear to auscultation bilaterally, no wheezing, no crackles. Normal respiratory effort. No accessory muscle use.  Cardiovascular: Regular rate and rhythm, no murmurs / rubs / gallops. No extremity edema. 2+ pedal pulses. No carotid bruits.  Abdomen: no tenderness, no masses palpated. No hepatosplenomegaly. Bowel sounds positive.  Musculoskeletal: no clubbing / cyanosis. No joint deformity upper and lower extremities. Good ROM, no contractures. Normal muscle tone.  Skin: no rashes, lesions, ulcers. No induration Neurologic: CN 2-12 grossly intact. Sensation intact,  DTR normal. Strength 5/5 in all 4.  Psychiatric: Normal judgment and insight. Alert and oriented x 3. Normal mood.     Labs on Admission: I have personally reviewed following labs and imaging studies  CBC: No results for input(s): "WBC", "NEUTROABS", "HGB", "HCT", "MCV", "PLT" in the last 168 hours. Basic Metabolic Panel: No results for input(s): "NA", "K", "CL", "CO2", "GLUCOSE", "BUN", "CREATININE", "CALCIUM", "MG", "PHOS" in the last 168 hours. GFR: CrCl cannot be calculated (Patient's most recent lab result is older than the maximum 21 days allowed.). Liver Function Tests: No results for input(s): "AST", "ALT", "ALKPHOS", "BILITOT", "PROT", "ALBUMIN" in the last 168 hours. No results for input(s): "LIPASE", "AMYLASE" in the last 168 hours. No results for input(s): "AMMONIA" in the last 168 hours. Coagulation Profile: No results for input(s): "INR", "PROTIME" in the last 168 hours. Cardiac Enzymes: No results for input(s): "CKTOTAL", "CKMB", "CKMBINDEX", "TROPONINI" in the last 168 hours. BNP (last 3 results) No results for input(s): "PROBNP" in the last 8760 hours. HbA1C: No results for input(s): "HGBA1C" in the last 72 hours. CBG: No results for input(s): "GLUCAP" in the last 168 hours. Lipid Profile: No results for input(s): "CHOL", "HDL", "LDLCALC", "TRIG", "CHOLHDL", "LDLDIRECT" in the last 72 hours. Thyroid Function Tests: No results for input(s): "TSH", "T4TOTAL", "FREET4", "T3FREE", "THYROIDAB" in the last 72 hours. Anemia Panel: No results for input(s): "VITAMINB12", "FOLATE", "FERRITIN", "TIBC", "IRON", "RETICCTPCT" in the last 72 hours. Urine analysis:    Component Value Date/Time   COLORURINE YELLOW 03/19/2022 0910   APPEARANCEUR CLEAR 03/19/2022 0910   LABSPEC 1.020 03/19/2022 0910   PHURINE 6.0 03/19/2022 0910   GLUCOSEU NEGATIVE 03/19/2022 0910   HGBUR TRACE-INTACT (A) 03/19/2022 0910   BILIRUBINUR negative 09/03/2022 1605   KETONESUR NEGATIVE 03/19/2022  0910   PROTEINUR Positive (A) 09/03/2022 1605   PROTEINUR NEGATIVE 12/26/2015 0214   UROBILINOGEN 0.2 09/03/2022 1605  UROBILINOGEN 1.0 03/19/2022 0910   NITRITE negative 09/03/2022 1605   NITRITE NEGATIVE 03/19/2022 0910   LEUKOCYTESUR Negative 09/03/2022 1605   LEUKOCYTESUR NEGATIVE 03/19/2022 0910    Radiological Exams on Admission: No results found.  EKG: None  Assessment/Plan Principal Problem:   AKI (acute kidney injury) (HCC) Active Problems:   Thrombocytopenia (HCC)   MGUS (monoclonal gammopathy of unknown significance)  (please populate well all problems here in Problem List. (For example, if patient is on BP meds at home and you resume or decide to hold them, it is a problem that needs to be her. Same for CAD, COPD, HLD and so on)  Chronic thrombocytopenia -Probably secondary to mucous -CBC for platelet count today.  Discussed with on-call IR attendingDr. Juliette Alcide, who recommend maintain platelet count> 50 K for the incoming IR guided renal biopsy. -Type and cross  History of PAF -Continue to hold Eliquis  Worsening of CKD stage IIIa -Discussed with patient nephrology Dr.Foster -Clinically appears to be euvolemic, no need for diuresis -Blood pressure is controlled  Chronic HFrEF with recovered LVEF -Euvolemic, most recent stress test in May 2024 showed recovered LVEF  IIDM -Hold off metformin -Start SSI  Morbid obesity -BMI 52, calorie control recommended  MGUS -Under active surveillance by oncology  DVT prophylaxis: SCD Code Status: Full code Family Communication: None at bedside Disposition Plan: Expect less than 2 midnight hospital stay Consults called: IR Admission status: [place] obs   Emeline General MD [facility] Pager [phone]  06/17/2023, 3:28 PM

## 2023-06-17 NOTE — Progress Notes (Signed)
   06/17/23 2244  BiPAP/CPAP/SIPAP  BiPAP/CPAP/SIPAP Pt Type Adult  BiPAP/CPAP/SIPAP  (home)  Mask Type Full face mask  Patient Home Equipment Yes  CPAP/SIPAP surface wiped down Yes  Safety Check Completed by RT for Home Unit Yes, no issues noted  BiPAP/CPAP /SiPAP Vitals  Pulse Rate 60  Resp 18  SpO2 97 %  Bilateral Breath Sounds Clear;Diminished  MEWS Score/Color  MEWS Score 0  MEWS Score Color [place]

## 2023-06-17 NOTE — Consult Note (Signed)
Chief Complaint: Patient was seen in consultation today for proteinuria.  Referring Physician(s): Estanislado Emms  Supervising Physician: Richarda Overlie, MD  Patient Status: [facility] - In-pt  History of Present Illness: Sean [name] is a 52 y.o. male with a past medical history significant for OSA, morbid obesity, GERD, DM, HTN, HLD, CAD s/p CABG x 1 (2017), CHF, paroxysmal a.flutter on Eliquis, thrombocytopenia, MGUS, cirrhosis with persistently elevated LFTs, CKD who has been directly admitted to the hospital today for a random renal biopsy on 7/18. Mr. Welker states he recently underwent a bone marrow biopsy due to concern for myeloma, however the biopsy did not show any signs of myeloma. He is awaiting another scan (skeletal survey?) that has been ordered by his hematologist Dr. Rennis Harding. He was seen by nephrology recently and was found to have worsening renal function with new proteinuria. It was recommended that he undergo renal biopsy to assess cause of proteinuria as well as part of assessment for multiple myeloma. He is also planned for a liver biopsy to further assess his elevated LFTs.  Patient seen at bedside, he denies complaints. He was told he would need some platelets prior to the kidney biopsy tomorrow because it has a high risk of bleeding and his platelets are chronically low. He is planning to stay overnight after the biopsy as well. He has a very good understanding of the procedure and is agreeable to proceed.  Past Medical History:  Diagnosis Date   Abnormal liver enzymes    a. Sees a doctor in [place].   Cardiac cirrhosis    a. possible elevated LFTs/low platelets felt due to cardiac cirrhosis per 2017 admission.   Chronic combined systolic and diastolic CHF (congestive heart failure) (HCC)    CKD (chronic kidney disease) stage 3, GFR 30-59 ml/min (HCC) 01/04/2016   Lab Results  Component  Value  Date/Time     GFR  29.24 (L)  09/30/2022 02:51 PM     GFR  27.27  (L)  09/24/2022 03:40 PM     GFR  24.10 (L)  09/03/2022 03:45 PM     GFR  18.98 (L)  08/25/2022 12:21 PM     GFR  38.98 (L)  04/24/2022 02:39 PM         CKD (chronic kidney disease), stage II    Stage 4   Congenital heart defect    a. rightward rotation of heart, almost dextrocardia   Coronary artery disease    a. s/p CABGx1 in 12/2015.   Gastric polyps 11/25/2022   Gastritis and gastroduodenitis 11/25/2022          Gastroesophageal reflux disease with esophagitis and hemorrhage 04/04/2021   Hematuria    a. Chronic hx of this, no prior etiology determined through workup per patient.   History of umbilical hernia repair 07/10/2011   History of hernia surgery twice prior   Hypercholesteremia    a. Prev taken off statin due to abnormal liver function.   Hypertension    Incisional hernia 07/10/2011   History of hernia surgery twice prior   Morbid obesity (HCC)    Morbid obesity with BMI of 50.0-59.9, adult (HCC) 01/04/2016   After [facility] [facility] doctor is setting him up with a wellness clinic to help with weight losS  He reports not having tried anything for weight loss either diet or or medicine or surgery in the past  We failed to get [facility] 10/2022      Wt Readings from Last 10  Encounters:  11/13/22  (!) 384 lb 12.8 oz (174.5 kg)  10/13/22  (!) 385 lb (174.6 kg)  09/30/22  (!) 384 lb (174.2 kg)  09/24/22  (!) 382 lb (1   Nausea and vomiting 11/25/2022   OSA on CPAP    Paroxysmal atrial flutter (HCC) 02/12/2016   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   LIMA to LAD   Sinus bradycardia    Thrombocytopenia (HCC)     Past Surgical History:  Procedure Laterality Date   APPENDECTOMY     BIOPSY  02/19/2021   Procedure: BIOPSY;  Surgeon: Tressia Danas, MD;  Location: [facility] ENDOSCOPY;  Service: Gastroenterology;;   BIOPSY  11/25/2022   Procedure: BIOPSY;  Surgeon: Tressia Danas, MD;  Location: [facility] ENDOSCOPY;  Service: Gastroenterology;;   CARDIAC CATHETERIZATION N/A 12/24/2015   Procedure:  Right/Left Heart Cath and Coronary Angiography;  Surgeon: Dolores Patty, MD;  Location: [facility] INVASIVE CV LAB;  Service: Cardiovascular;  Laterality: N/A;   CARDIOVERSION N/A 02/14/2016   Procedure: CARDIOVERSION;  Surgeon: Chrystie Nose, MD;  Location: [facility] ENDOSCOPY;  Service: Cardiovascular;  Laterality: N/A;   COLONOSCOPY WITH PROPOFOL N/A 02/19/2021   Procedure: COLONOSCOPY WITH PROPOFOL;  Surgeon: Tressia Danas, MD;  Location: [facility] ENDOSCOPY;  Service: Gastroenterology;  Laterality: N/A;   CORONARY ARTERY BYPASS GRAFT N/A 12/26/2015   Procedure: Off Pump Coronary Artery Bypass Grafting times one using left internal mammary artery;  Surgeon: Purcell Nails, MD;  Location: [facility] OR;  Service: Open Heart Surgery;  Laterality: N/A;   ESOPHAGOGASTRODUODENOSCOPY (EGD) WITH PROPOFOL N/A 02/19/2021   Procedure: ESOPHAGOGASTRODUODENOSCOPY (EGD) WITH PROPOFOL;  Surgeon: Tressia Danas, MD;  Location: [facility] ENDOSCOPY;  Service: Gastroenterology;  Laterality: N/A;   ESOPHAGOGASTRODUODENOSCOPY (EGD) WITH PROPOFOL N/A 11/25/2022   Procedure: ESOPHAGOGASTRODUODENOSCOPY (EGD) WITH PROPOFOL;  Surgeon: Tressia Danas, MD;  Location: [facility] ENDOSCOPY;  Service: Gastroenterology;  Laterality: N/A;   GALLBLADDER SURGERY     HERNIA REPAIR     LEFT HEART CATH AND CORS/GRAFTS ANGIOGRAPHY N/A 04/15/2017   Procedure: Left Heart Cath and Cors/Grafts Angiography;  Surgeon: Lennette Bihari, MD;  Location: [facility] INVASIVE CV LAB;  Service: Cardiovascular;  Laterality: N/A;   LEFT HEART CATH AND CORS/GRAFTS ANGIOGRAPHY N/A 11/02/2020   Procedure: LEFT HEART CATH AND CORS/GRAFTS ANGIOGRAPHY;  Surgeon: Tonny Bollman, MD;  Location: [facility] INVASIVE CV LAB;  Service: Cardiovascular;  Laterality: N/A;   POLYPECTOMY  02/19/2021   Procedure: POLYPECTOMY;  Surgeon: Tressia Danas, MD;  Location: [facility] ENDOSCOPY;  Service: Gastroenterology;;   TEE WITHOUT CARDIOVERSION N/A 12/26/2015   Procedure: TRANSESOPHAGEAL ECHOCARDIOGRAM (TEE);   Surgeon: Purcell Nails, MD;  Location: [facility] OR;  Service: Open Heart Surgery;  Laterality: N/A;   TEE WITHOUT CARDIOVERSION N/A 02/14/2016   Procedure: TRANSESOPHAGEAL ECHOCARDIOGRAM (TEE);  Surgeon: Chrystie Nose, MD;  Location: [facility] ENDOSCOPY;  Service: Cardiovascular;  Laterality: N/A;    Allergies: Doxycycline, Imdur [isosorbide dinitrate], Valtrex [valacyclovir], Augmentin [amoxicillin-pot clavulanate], and Tape  Medications: Prior to Admission medications   Medication Sig Start Date End Date Taking? Authorizing Provider  allopurinol (ZYLOPRIM) 100 MG tablet TAKE 1 TABLET(100 MG) BY MOUTH DAILY 04/27/23  Yes Lula Olszewski, MD  acetaminophen (TYLENOL) 500 MG tablet Take 500-1,500 mg by mouth as needed for moderate pain, fever, headache or mild pain.    [provider]  albuterol (VENTOLIN HFA) 108 (90 Base) MCG/ACT inhaler Inhale into the lungs. 07/22/21   [provider]  amitriptyline (ELAVIL) 25 MG tablet TAKE 1 TABLET(25 MG) BY MOUTH AT  BEDTIME 03/24/23   Tressia Danas, MD  apixaban (ELIQUIS) 5 MG TABS tablet Take 1 tablet (5 mg total) by mouth 2 (two) times daily. 11/26/22   Tressia Danas, MD  azelastine (ASTELIN) 0.1 % nasal spray Place 2 sprays into both nostrils 2 (two) times daily. Patient taking differently: Place 2 sprays into both nostrils 2 (two) times daily. As needed. 02/04/22   Ardith Dark, MD  Cyanocobalamin (VITAMIN B-12) 5000 MCG SUBL Place 5,000 mcg under the tongue daily.    [provider]  diltiazem (CARDIZEM SR) 120 MG 12 hr capsule TAKE 1 CAPSULE(120 MG) BY MOUTH TWICE DAILY 02/12/23   Lula Olszewski, MD  empagliflozin (JARDIANCE) 10 MG TABS tablet Take 1 tablet (10 mg total) by mouth daily before breakfast. 10/13/22   Lula Olszewski, MD  Evolocumab (REPATHA SURECLICK) 140 MG/ML SOAJ INJECT THE CONTENTS OF 1 SYRINGE UNDER THE SKIN EVERY 14 DAYS 12/17/22   Meriam Sprague, MD  famotidine (PEPCID) 20 MG tablet TAKE 1  TABLET(20 MG) BY MOUTH TWICE DAILY 03/24/23   Tressia Danas, MD  fluticasone (FLONASE) 50 MCG/ACT nasal spray as needed. 04/04/21   [provider]  levothyroxine (SYNTHROID) 50 MCG tablet TAKE 1 TABLET(50 MCG) BY MOUTH DAILY BEFORE BREAKFAST 10/15/22   Lula Olszewski, MD  losartan (COZAAR) 25 MG tablet Take 1 tablet (25 mg total) by mouth daily. Patient not taking: Reported on 06/01/2023 11/13/22   Lula Olszewski, MD  metFORMIN (GLUCOPHAGE) 500 MG tablet Take 1 tablet (500 mg total) by mouth 2 (two) times daily with a meal. Patient not taking: Reported on 06/01/2023 05/26/23   Lula Olszewski, MD  montelukast (SINGULAIR) 10 MG tablet TAKE 1 TABLET(10 MG) BY MOUTH DAILY Patient not taking: Reported on 06/01/2023 05/24/23   Lula Olszewski, MD  nitroGLYCERIN (NITROSTAT) 0.4 MG SL tablet Place 1 tablet (0.4 mg total) under the tongue every 5 (five) minutes as needed for chest pain. Patient not taking: Reported on 06/01/2023 02/10/22   Riley Lam A, MD  ondansetron (ZOFRAN-ODT) 4 MG disintegrating tablet Take 1 tablet (4 mg total) by mouth every 8 (eight) hours as needed for nausea or vomiting (for nausea from wegovy or other source). 01/26/23   Lula Olszewski, MD  pantoprazole (PROTONIX) 40 MG tablet TAKE 1 TABLET(40 MG) BY MOUTH TWICE DAILY 03/24/23   Lula Olszewski, MD  polyethylene glycol powder (GLYCOLAX/MIRALAX) 17 GM/SCOOP powder Take 1 dose daily. May increase to BID if needed Patient taking differently: Take 17 g by mouth daily as needed for moderate constipation. 02/26/22   Tressia Danas, MD  potassium chloride (KLOR-CON M) 10 MEQ tablet TAKE 1 TABLET(10 MEQ) BY MOUTH DAILY Patient not taking: Reported on 06/01/2023 05/24/23   Lula Olszewski, MD  sucralfate (CARAFATE) 1 g tablet TAKE 1 TABLET(1 GRAM) BY MOUTH TWICE DAILY. MAY INCREASE TO 4 TIMES DAILY FOR ANY ACUTE SYMPTOMS Patient taking differently: PT STATES HE IS ONLY TAKING ONCE A DAY 06/01/23 11/26/22   Tressia Danas, MD     Family History  Problem Relation Age of Onset   CAD Father        4 stents ~ 54   Diabetes Father    Diabetes Maternal Grandfather    Diabetes Maternal Uncle    Heart attack Other    Hyperlipidemia Other     Social History   Socioeconomic History   Marital status: Divorced    Spouse name: Not on file   Number  of children: Not on file   Years of education: Not on file   Highest education level: Not on file  Occupational History   Occupation: [employer] doctor  Tobacco Use   Smoking status: Never   Smokeless tobacco: Never  Vaping Use   Vaping status: Never Used  Substance and Sexual Activity   Alcohol use: Yes    Comment: 6 beers per month   Drug use: No   Sexual activity: Not Currently  Other Topics Concern   Not on file  Social History Narrative   Truck driver -- regional   Lives with mom and dad   Social Determinants of Health   Financial Resource Strain: Not on file  Food Insecurity: No Food Insecurity (06/17/2023)   Hunger Vital Sign    Worried About Running Out of Food in the Last Year: Never true    Ran Out of Food in the Last Year: Never true  Transportation Needs: No Transportation Needs (06/17/2023)   PRAPARE - Administrator, Civil Service (Medical): No    Lack of Transportation (Non-Medical): No  Physical Activity: Not on file  Stress: Not on file  Social Connections: Unknown (08/26/2022)   Received from [facility]   Social Network    Social Network: Not on file     Review of Systems: A 12 point ROS discussed and pertinent positives are indicated in the HPI above.  All other systems are negative.  Review of Systems  Constitutional:  Negative for chills and fever.  Respiratory:  Negative for cough and shortness of breath.   Cardiovascular:  Negative for chest pain.  Gastrointestinal:  Negative for abdominal pain, diarrhea, nausea and vomiting.  Musculoskeletal:  Negative for back pain.  Neurological:  Negative for  dizziness and headaches.    Vital Signs: BP (!) 145/62   Pulse (!) 56   Temp 98.4 F (36.9 C)   Resp 16   Ht 5\' 11"  (1.803 m)   Wt (!) 377 lb 3.3 oz (171.1 kg)   SpO2 97%   BMI 52.61 kg/m   Physical Exam Vitals and nursing note reviewed.  Constitutional:      General: He is not in acute distress.    Appearance: He is obese. He is not ill-appearing.  HENT:     Head: Normocephalic.     Mouth/Throat:     Mouth: Mucous membranes are moist.     Pharynx: Oropharynx is clear. No oropharyngeal exudate or posterior oropharyngeal erythema.  Cardiovascular:     Rate and Rhythm: Normal rate and regular rhythm.  Pulmonary:     Effort: Pulmonary effort is normal.     Breath sounds: Normal breath sounds.  Abdominal:     Palpations: Abdomen is soft.  Skin:    General: Skin is warm and dry.  Neurological:     Mental Status: He is alert and oriented to person, place, and time.  Psychiatric:        Mood and Affect: Mood normal.        Behavior: Behavior normal.        Thought Content: Thought content normal.        Judgment: Judgment normal.      MD Evaluation Airway: WNL Heart: WNL Abdomen: WNL Chest/ Lungs: WNL ASA  Classification: 4, 3 Mallampati/Airway Score: Two   Imaging: No results found.  Labs:  CBC: Recent Labs    01/26/23 0000 04/03/23 0000 04/10/23 0000 05/04/23 1119 06/17/23 1525  WBC 7.3 5.2 5.2  --  5.0  HGB 14.8 12.9* 12.8*  --  12.0*  HCT 44 38* 39*  --  36.3*  PLT 55* 61* 72* 55* 63*    COAGS: Recent Labs    04/10/23 0000  INR 1.00    BMP: Recent Labs    09/30/22 1451 01/26/23 1617 04/03/23 1609 04/10/23 0000 06/17/23 1525  NA 142 141 137 143 142  K 4.1 4.5 3.9 4.5 4.1  CL 109 110 109 111* 110  CO2 27 21* 20* 23* 24  GLUCOSE 92 130* 95  --  120*  BUN 27* 39* 39* 33* 36*  CALCIUM 9.6 9.4 8.6* 9.2 9.0  CREATININE 2.51* 2.33* 3.02* 3.0* 3.35*  GFRNONAA  --  33* 24*  --  21*    LIVER FUNCTION TESTS: Recent Labs     09/24/22 1540 09/30/22 1451 11/25/22 0000 01/26/23 1617 04/03/23 1609 04/10/23 0000 05/04/23 1119  BILITOT 1.3* 1.2  --  1.1 0.8  --   --   AST 33 32  --  52* 53* 60* 36  ALT 30 29  --  71* 64* 74* 39  ALKPHOS 156* 161* 205* 165* 195* 227*  --   PROT 6.2 6.8  --  7.0 6.4*  --   --   ALBUMIN 3.2* 3.5  --  3.2* 2.9* 3.4*  --     TUMOR MARKERS: No results for input(s): "AFPTM", "CEA", "CA199", "CHROMGRNA" in the last 8760 hours.  Assessment and Plan:  52 y/o M with complicated medical history as per HPI recently found to have concern for multiple myeloma however bone marrow biopsy recently was reportedly non-diagnostic. He also has worsening CKD with proteinuria and IR has been consulted for a random renal biopsy to further assess worsening renal function as well as part of ongoing multiple myeloma workup. He has been admitted pre-procedure per recommendation of nephrology and is planned to receive platelets prior to renal biopsy.  Admission labs significant for hgb 12.0, plt 63 (baseline per chart review), creatinine 3.35, BUN 36.   Agree with plans for platelets prior to IR procedure which is planned for 8 am on 7/18. Will repeat CBC and obtain INR AM of 7/18 to ensure adequate plt count post transfusion. Additionally, his SBP was elevated to 155 on my exam this afternoon -- will need to ensure BP under appropriate control prior to proceeding given this is a high risk bleeding procedure.  Per outpatient order plan to obtain amyloid stain as well. [facility] requisition form signed under media tab.  Risks and benefits of random renal biopsy was discussed with the patient and/or patient's family including, but not limited to bleeding, infection, damage to adjacent structures or low yield requiring additional tests.  All of the questions were answered and there is agreement to proceed.  Consent signed and in chart.   Thank you for this interesting consult.  I greatly enjoyed meeting  Rylyn Italy Bittinger and look forward to participating in their care.  A copy of this report was sent to the requesting provider on this date.  Electronically Signed: Villa Herb, PA-C 06/17/2023, 4:31 PM   I spent a total of 40 Minutes  in face to face in clinical consultation, greater than 50% of which was counseling/coordinating care for proteinuria.

## 2023-06-17 NOTE — Progress Notes (Signed)
Doctor made aware of patient's arrival to the floor.

## 2023-06-18 ENCOUNTER — Observation Stay (HOSPITAL_COMMUNITY)
Admission: RE | Admit: 2023-06-18 | Discharge: 2023-06-18 | Disposition: A | Discharge status: Home / Self Care | Payer: BC Managed Care – PPO | Source: Ambulatory Visit | Attending: Nephrology | Admitting: Nephrology

## 2023-06-18 DIAGNOSIS — I130 Hypertensive heart and chronic kidney disease with heart failure and stage 1 through stage 4 chronic kidney disease, or unspecified chronic kidney disease: Secondary | ICD-10-CM | POA: Diagnosis not present

## 2023-06-18 DIAGNOSIS — Z79899 Other long term (current) drug therapy: Secondary | ICD-10-CM | POA: Diagnosis not present

## 2023-06-18 DIAGNOSIS — Z7901 Long term (current) use of anticoagulants: Secondary | ICD-10-CM | POA: Diagnosis not present

## 2023-06-18 DIAGNOSIS — I480 Paroxysmal atrial fibrillation: Secondary | ICD-10-CM | POA: Diagnosis not present

## 2023-06-18 DIAGNOSIS — Z951 Presence of aortocoronary bypass graft: Secondary | ICD-10-CM | POA: Diagnosis not present

## 2023-06-18 DIAGNOSIS — R809 Proteinuria, unspecified: Secondary | ICD-10-CM

## 2023-06-18 DIAGNOSIS — Z7984 Long term (current) use of oral hypoglycemic drugs: Secondary | ICD-10-CM | POA: Diagnosis not present

## 2023-06-18 DIAGNOSIS — I5022 Chronic systolic (congestive) heart failure: Secondary | ICD-10-CM | POA: Diagnosis not present

## 2023-06-18 DIAGNOSIS — D472 Monoclonal gammopathy: Secondary | ICD-10-CM | POA: Diagnosis not present

## 2023-06-18 DIAGNOSIS — N179 Acute kidney failure, unspecified: Secondary | ICD-10-CM | POA: Diagnosis not present

## 2023-06-18 DIAGNOSIS — D696 Thrombocytopenia, unspecified: Secondary | ICD-10-CM | POA: Diagnosis not present

## 2023-06-18 DIAGNOSIS — N184 Chronic kidney disease, stage 4 (severe): Secondary | ICD-10-CM | POA: Insufficient documentation

## 2023-06-18 DIAGNOSIS — E1122 Type 2 diabetes mellitus with diabetic chronic kidney disease: Secondary | ICD-10-CM | POA: Diagnosis not present

## 2023-06-18 DIAGNOSIS — R944 Abnormal results of kidney function studies: Secondary | ICD-10-CM | POA: Diagnosis not present

## 2023-06-18 DIAGNOSIS — Z6841 Body Mass Index (BMI) 40.0 and over, adult: Secondary | ICD-10-CM | POA: Diagnosis not present

## 2023-06-18 DIAGNOSIS — I2510 Atherosclerotic heart disease of native coronary artery without angina pectoris: Secondary | ICD-10-CM | POA: Diagnosis not present

## 2023-06-18 LAB — CBC WITH DIFFERENTIAL/PLATELET
Abs Immature Granulocytes: 0.02 10*3/uL (ref 0.00–0.07)
Basophils Absolute: 0 10*3/uL (ref 0.0–0.1)
Basophils Relative: 1 %
Eosinophils Absolute: 0.4 10*3/uL (ref 0.0–0.5)
Eosinophils Relative: 10 %
HCT: 35.7 % — ABNORMAL LOW (ref 39.0–52.0)
Hemoglobin: 11.5 g/dL — ABNORMAL LOW (ref 13.0–17.0)
Immature Granulocytes: 0 %
Lymphocytes Relative: 25 %
Lymphs Abs: 1.2 10*3/uL (ref 0.7–4.0)
MCH: 32.6 pg (ref 26.0–34.0)
MCHC: 32.2 g/dL (ref 30.0–36.0)
MCV: 101.1 fL — ABNORMAL HIGH (ref 80.0–100.0)
Monocytes Absolute: 0.5 10*3/uL (ref 0.1–1.0)
Monocytes Relative: 11 %
Neutro Abs: 2.5 10*3/uL (ref 1.7–7.7)
Neutrophils Relative %: 53 %
Platelets: 55 10*3/uL — ABNORMAL LOW (ref 150–400)
RBC: 3.53 MIL/uL — ABNORMAL LOW (ref 4.22–5.81)
RDW: 14.3 % (ref 11.5–15.5)
WBC: 4.7 10*3/uL (ref 4.0–10.5)
nRBC: 0 % (ref 0.0–0.2)

## 2023-06-18 LAB — GLUCOSE, CAPILLARY
Glucose-Capillary: 85 mg/dL (ref 70–99)
Glucose-Capillary: 133 mg/dL — ABNORMAL HIGH (ref 70–99)
Glucose-Capillary: 143 mg/dL — ABNORMAL HIGH (ref 70–99)

## 2023-06-18 LAB — CBC
HCT: 34.8 % — ABNORMAL LOW (ref 39.0–52.0)
Hemoglobin: 11.6 g/dL — ABNORMAL LOW (ref 13.0–17.0)
MCH: 33.8 pg (ref 26.0–34.0)
MCHC: 33.3 g/dL (ref 30.0–36.0)
MCV: 101.5 fL — ABNORMAL HIGH (ref 80.0–100.0)
Platelets: 53 10*3/uL — ABNORMAL LOW (ref 150–400)
RBC: 3.43 MIL/uL — ABNORMAL LOW (ref 4.22–5.81)
RDW: 14.3 % (ref 11.5–15.5)
WBC: 5.3 10*3/uL (ref 4.0–10.5)
nRBC: 0 % (ref 0.0–0.2)

## 2023-06-18 LAB — HEMOGLOBIN A1C
Hgb A1c MFr Bld: 5.6 % (ref 4.8–5.6)
Mean Plasma Glucose: 114 mg/dL

## 2023-06-18 LAB — PROTIME-INR
INR: 1.1 (ref 0.8–1.2)
Prothrombin Time: 14.5 seconds (ref 11.4–15.2)

## 2023-06-18 MED ORDER — FENTANYL CITRATE (PF) 100 MCG/2ML IJ SOLN
INTRAMUSCULAR | Status: AC
  Filled 2023-06-18: qty 2

## 2023-06-18 MED ORDER — GELATIN ABSORBABLE 12-7 MM EX MISC
1.0000 | Freq: Once | CUTANEOUS | Status: AC
  Administered 2023-06-18: 1 via TOPICAL

## 2023-06-18 MED ORDER — MIDAZOLAM HCL 2 MG/2ML IJ SOLN
INTRAMUSCULAR | Status: AC | PRN
  Administered 2023-06-18 (×2): 1 mg via INTRAVENOUS

## 2023-06-18 MED ORDER — FENTANYL CITRATE (PF) 100 MCG/2ML IJ SOLN
INTRAMUSCULAR | Status: AC | PRN
  Administered 2023-06-18 (×2): 50 ug via INTRAVENOUS

## 2023-06-18 MED ORDER — LIDOCAINE HCL 1 % IJ SOLN
10.0000 mL | Freq: Once | INTRAMUSCULAR | Status: AC
  Administered 2023-06-18: 10 mL via INTRADERMAL

## 2023-06-18 MED ORDER — MIDAZOLAM HCL 2 MG/2ML IJ SOLN
INTRAMUSCULAR | Status: AC
  Filled 2023-06-18: qty 2

## 2023-06-18 NOTE — Plan of Care (Signed)
  Problem: Education: Goal: Knowledge of General Education information will improve Description: Including pain rating scale, medication(s)/side effects and non-pharmacologic comfort measures Outcome: Progressing Pt understands her was admitted into the hospital for a kidney biopsy.  He has a history of CKD 3.   Problem: Clinical Measurements: Goal: Ability to maintain clinical measurements within normal limits will improve Outcome: Progressing Pt's VS WNL thus far.   Problem: Clinical Measurements: Goal: Will remain free from infection Outcome: Progressing S/Sx of infection monitored and assessed q-shift. Pt has remained afebrile thus far.   Problem: Clinical Measurements: Goal: Respiratory complications will improve Outcome: Progressing Respiratory status monitored and assessed q-shift. Pt is on room air with O2 saturations at 92-100% and respirations rate of 16-18 breaths per minute. Pt has not endorsed c/o SOB or DOE.   Problem: Activity: Goal: Risk for activity intolerance will decrease Outcome: Progressing Pt is independent of all his ADLs.  He was observed OOB walking in his room with a steady gait.   Problem: Nutrition: Goal: Adequate nutrition will be maintained Outcome: Progressing Pt is on a carb modified diet per MD's orders. Pt has not endorsed c/o abdominal pain distention or n/v.   Problem: Elimination: Goal: Will not experience complications related to bowel motility Outcome: Progressing Pt's LBM was on 06/17/2023.  Pt has not endorsed c/o constipation, abdominal pain distention or n/v.      Problem: Elimination: Goal: Will not experience complications related to urinary retention Outcome: Progressing Pt is continent of his bladder.  He utilizes the urinal to capture his urine.  Pt has not endorsed c/o dysuria or abdominal pain distention.   Problem: Pain Managment: Goal: General experience of comfort will improve Outcome: Progressing Pt has endorsed 5/10  headache describing it as a an ache. Reiterated pain scale so he could adequately rate his pain. Pt stated his pain goal would be 0/10. Discussed nonpharmacological methods to help reduce s/sx of pain. Interventions given per pt's request and MD's orders    Problem: Safety: Goal: Ability to remain free from injury will improve Outcome: Progressing Pt has remained free from falls thus far.  Instructed pt to utilize RN call light for assistance.  Hourly rounds performed.  Bed in lowest position, locked with two upper side rails engaged.  Belongings and call light within reach.   Problem: Skin Integrity: Goal: Risk for impaired skin integrity will decrease Outcome: Progressing Skin integrity monitored and assessed q-shift. Instructed pt to turn reposition himself q2 hours to prevent further skin impairment. Tubes and drains assessed for device related pressure sores. Pt is continent of his bowel and bladder. Dressing changes performed per MD's orders.

## 2023-06-18 NOTE — Progress Notes (Signed)
   06/18/23 2238  BiPAP/CPAP/SIPAP  BiPAP/CPAP/SIPAP Pt Type Adult  BiPAP/CPAP/SIPAP  (home)  Mask Type Full face mask  Patient Home Equipment Yes  Safety Check Completed by RT for Home Unit Yes, no issues noted  BiPAP/CPAP /SiPAP Vitals  Pulse Rate 66  Resp 18  SpO2 97 %  Bilateral Breath Sounds Clear;Diminished  MEWS Score/Color  MEWS Score 0  MEWS Score Color Green     06/18/23 2238  BiPAP/CPAP/SIPAP  BiPAP/CPAP/SIPAP Pt Type Adult  BiPAP/CPAP/SIPAP  (home)  Mask Type Full face mask  Patient Home Equipment Yes  Safety Check Completed by RT for Home Unit Yes, no issues noted  BiPAP/CPAP /SiPAP Vitals  Pulse Rate 66  Resp 18  SpO2 97 %  Bilateral Breath Sounds Clear;Diminished  MEWS Score/Color  MEWS Score 0  MEWS Score Color Green

## 2023-06-18 NOTE — Progress Notes (Signed)
Transition of Care [facility]) - Inpatient Brief Assessment   Patient Details  Name: Sean [name] MRN: [id number] Date of Birth: Dec 17, 1970  [facility]) CM/SW Contact:    Janae Bridgeman, RN Phone Number: 06/18/2023, 9:01 AM   Clinical Narrative: Patient from home - admitted for AKI - pending biopsy - no TOC needs determined at this time.   Transition of Care Asessment: Insurance and Status: (P) Insurance coverage has been reviewed Patient has primary care physician: (P) Yes Home environment has been reviewed: (P) Yes Prior level of function:: (P) Independent Prior/Current Home Services: (P) No current home services Social Determinants of Health Reivew: (P) SDOH reviewed no interventions necessary Readmission risk has been reviewed: (P) Yes Transition of care needs: (P) no transition of care needs at this time

## 2023-06-18 NOTE — Hospital Course (Addendum)
Sean [name] is a 51 y.o. male with medical history significant of CKD stage IIIb, MGUS with chronic thrombocytopenia, CAD status post CABG, chronic HFrEF with recovered LVEF, PAF on Eliquis, HTN, HLD, insulin-dependent diabetes mellitus was sent from nephrologist office for elective kidney biopsy. Patient has a history of MGUS with chronic thrombocytopenia underwent bone biopsy May 2024.  He is not on any chemotherapy.  Recently patient has worsening of kidney function in the last 8 months with trending up up creatinines from 2.3 to 3.06 weeks ago, and nephrology was concerned about renal amyloidosis and requested IR guided biopsy. Most recent platelet count 55 on May 10 patient denied any easy bruising or bleeding.  Assessment/Plan  Acute kidney injury.  Status post CT-guided random right renal biopsy on 06/18/2023.  Neurology recommended biopsy for suspicion of amyloidosis.  IR recommends overnight observation due to thrombocytopenia.  Chronic thrombocytopenia Latest platelet count of 53.  Status postrenal biopsy.  Check platelet count in AM.  History of PAF Continue to hold Eliquis.   AKI on CKD stage IIIa Neurology on board.  Euvolemic.  Creatinine of 3.3.  Will need outpatient nephrology follow-up after discharge.   Chronic HFrEF with recovered LVEF -Euvolemic, most recent stress test in May 2024 showed recovered LVEF.  Continue Cardizem, losartan   Diabetes mellitus type 2. Continue sliding scale insulin Accu-Cheks diabetic diet.  Hold metformin.  Morbid obesity Body mass index is 52.61 kg/m.  Would benefit from weight loss as outpatient.   MGUS -Under active surveillance by oncology

## 2023-06-18 NOTE — Procedures (Signed)
Interventional Radiology Procedure:   Indications: CKD stage 4  Procedure: CT guided random right renal biopsy  Findings: 2 core biopsies from right kidney lower pole.  Gelfoam slurry injected.  No complication.   Complications: None     EBL: Minimal  Plan: Strict bedrest for 4 hours.  Due to thrombocytopenia, recommend overnight observation and bedrest most of bed.   Lian Tanori R. Lowella Dandy, MD  Pager: [phone]

## 2023-06-18 NOTE — Progress Notes (Signed)
PROGRESS NOTE    Sean [name]  ZDG:[id number] DOB: Jan 07, 1971 DOA: 06/17/2023 PCP: Lula Olszewski, MD    Brief Narrative:   Sean [name] is a 52 y.o. male with medical history significant of CKD stage IIIb, MGUS with chronic thrombocytopenia, CAD status post CABG, chronic HFrEF with recovered LVEF, PAF on Eliquis, HTN, HLD, insulin-dependent diabetes mellitus was sent from nephrologist office for elective kidney biopsy. Patient has a history of MGUS with chronic thrombocytopenia underwent bone biopsy May 2024.  He is not on any chemotherapy.  Recently patient has worsening of kidney function in the last 8 months with trending up up creatinines from 2.3 to 3.06 weeks ago, and nephrology was concerned about renal amyloidosis and requested IR guided biopsy. Most recent platelet count 55 on May 10 patient denied any easy bruising or bleeding.  Patient follows up with Dr. Malen Gauze nephrology as outpatient.  Assessment/Plan  Acute kidney injury.  Status post CT-guided random right renal biopsy on 06/18/2023.  Neurology recommended biopsy for suspicion of amyloidosis.  IR recommends overnight observation due to thrombocytopenia.  Patient follows up with Dr. Malen Gauze nephrology as outpatient.  Chronic thrombocytopenia Latest platelet count of 53.  Status postrenal biopsy.  Check platelet count in AM.  History of PAF Continue to hold Eliquis.   AKI on CKD stage IIIa Neurology on board.  Euvolemic.  Creatinine of 3.3.  Will need outpatient nephrology follow-up after discharge.   Chronic HFrEF with recovered LVEF -Euvolemic, most recent stress test in May 2024 showed recovered LVEF.  Continue Cardizem, losartan   Diabetes mellitus type 2. Continue sliding scale insulin Accu-Cheks diabetic diet.  Hold metformin.  Morbid obesity Body mass index is 52.61 kg/m.  Would benefit from weight loss as outpatient.   MGUS -Under active surveillance by oncology     DVT prophylaxis: SCDs  Start: 06/17/23 1449   Code Status:     Code Status: Full Code  Disposition: Home likely on 06/19/2023  Status is: Observation  The patient will require care spanning > 2 midnights and should be moved to inpatient because: Status post renal biopsy, thrombocytopenia, need for closer monitoring for hematoma.   Family Communication: None at bedside  Consultants:  Interventional radiology  Procedures:  CT-guided random right renal biopsy on 06/18/2023.  Antimicrobials:  None  Anti-infectives (From admission, onward)    None        Subjective: Today, patient was seen and examined at bedside.  Seen after renal biopsy.  Has mild discomfort at the biopsy site.  No other complaints.  Multiple family members at bedside.  Objective: Vitals:   06/17/23 1845 06/17/23 1945 06/17/23 2244 06/18/23 0409  BP:  (!) 146/55  (!) 129/58  Pulse:  (!) 58 60 (!) 54  Resp:  18 18 18   Temp:  97.7 F (36.5 C)  (!) 97.3 F (36.3 C)  TempSrc: Oral     SpO2:  97% 97% 98%  Weight:      Height:        Intake/Output Summary (Last 24 hours) at 06/18/2023 1202 Last data filed at 06/17/2023 1802 Gross per 24 hour  Intake 476 ml  Output --  Net 476 ml   Filed Weights   06/17/23 1425 06/17/23 1844  Weight: (!) 171.1 kg (!) 171.1 kg    Physical Examination: Body mass index is 52.61 kg/m.  General: Morbidly obese  not in obvious distress HENT:   No scleral pallor or icterus noted. Oral mucosa is moist.  Chest:  Clear breath sounds.  Diminished breath sounds bilaterally. No crackles or wheezes.  CVS: S1 &S2 heard. No murmur.  Regular rate and rhythm. Abdomen: Soft, nontender, nondistended.  Bowel sounds are heard.  Right loin  with bandaid, no induration or hematoma.. Extremities: No cyanosis, clubbing or edema.  Peripheral pulses are palpable. Psych: Alert, awake and oriented, normal mood CNS:  No cranial nerve deficits.  Power equal in all extremities.   Skin: Warm and dry.  No rashes  noted.  Data Reviewed:   CBC: Recent Labs  Lab 06/17/23 1525 06/17/23 2337 06/18/23 0751  WBC 5.0 5.3 4.7  NEUTROABS  --   --  2.5  HGB 12.0* 11.6* 11.5*  HCT 36.3* 34.8* 35.7*  MCV 102.0* 101.5* 101.1*  PLT 63* 53* 55*    Basic Metabolic Panel: Recent Labs  Lab 06/17/23 1525  NA 142  K 4.1  CL 110  CO2 24  GLUCOSE 120*  BUN 36*  CREATININE 3.35*  CALCIUM 9.0    Liver Function Tests: No results for input(s): "AST", "ALT", "ALKPHOS", "BILITOT", "PROT", "ALBUMIN" in the last 168 hours.   Radiology Studies: No results found.    LOS: 0 days     Joycelyn Das, MD [facility] Available via Epic secure chat 7am-7pm After these hours, please refer to coverage provider listed on [email or web address] 06/18/2023, 12:02 PM

## 2023-06-19 DIAGNOSIS — Z6841 Body Mass Index (BMI) 40.0 and over, adult: Secondary | ICD-10-CM | POA: Diagnosis not present

## 2023-06-19 DIAGNOSIS — N184 Chronic kidney disease, stage 4 (severe): Secondary | ICD-10-CM | POA: Diagnosis not present

## 2023-06-19 DIAGNOSIS — I2510 Atherosclerotic heart disease of native coronary artery without angina pectoris: Secondary | ICD-10-CM | POA: Diagnosis not present

## 2023-06-19 DIAGNOSIS — Z7984 Long term (current) use of oral hypoglycemic drugs: Secondary | ICD-10-CM | POA: Diagnosis not present

## 2023-06-19 DIAGNOSIS — Z79899 Other long term (current) drug therapy: Secondary | ICD-10-CM | POA: Diagnosis not present

## 2023-06-19 DIAGNOSIS — I130 Hypertensive heart and chronic kidney disease with heart failure and stage 1 through stage 4 chronic kidney disease, or unspecified chronic kidney disease: Secondary | ICD-10-CM | POA: Diagnosis not present

## 2023-06-19 DIAGNOSIS — D696 Thrombocytopenia, unspecified: Secondary | ICD-10-CM | POA: Diagnosis not present

## 2023-06-19 DIAGNOSIS — N179 Acute kidney failure, unspecified: Secondary | ICD-10-CM | POA: Diagnosis not present

## 2023-06-19 DIAGNOSIS — Z951 Presence of aortocoronary bypass graft: Secondary | ICD-10-CM | POA: Diagnosis not present

## 2023-06-19 DIAGNOSIS — R944 Abnormal results of kidney function studies: Secondary | ICD-10-CM | POA: Diagnosis not present

## 2023-06-19 DIAGNOSIS — E1122 Type 2 diabetes mellitus with diabetic chronic kidney disease: Secondary | ICD-10-CM | POA: Diagnosis not present

## 2023-06-19 DIAGNOSIS — I5022 Chronic systolic (congestive) heart failure: Secondary | ICD-10-CM | POA: Diagnosis not present

## 2023-06-19 DIAGNOSIS — Z7901 Long term (current) use of anticoagulants: Secondary | ICD-10-CM | POA: Diagnosis not present

## 2023-06-19 DIAGNOSIS — R809 Proteinuria, unspecified: Secondary | ICD-10-CM | POA: Diagnosis not present

## 2023-06-19 DIAGNOSIS — I480 Paroxysmal atrial fibrillation: Secondary | ICD-10-CM | POA: Diagnosis not present

## 2023-06-19 DIAGNOSIS — D472 Monoclonal gammopathy: Secondary | ICD-10-CM | POA: Diagnosis not present

## 2023-06-19 LAB — BASIC METABOLIC PANEL
Anion gap: 6 (ref 5–15)
BUN: 42 mg/dL — ABNORMAL HIGH (ref 6–20)
CO2: 23 mmol/L (ref 22–32)
Calcium: 8.6 mg/dL — ABNORMAL LOW (ref 8.9–10.3)
Chloride: 110 mmol/L (ref 98–111)
Creatinine, Ser: 3.76 mg/dL — ABNORMAL HIGH (ref 0.61–1.24)
GFR, Estimated: 19 mL/min — ABNORMAL LOW (ref >60)
Glucose, Bld: 131 mg/dL — ABNORMAL HIGH (ref 70–99)
Potassium: 4.2 mmol/L (ref 3.5–5.1)
Sodium: 139 mmol/L (ref 135–145)

## 2023-06-19 LAB — CBC
HCT: 34.1 % — ABNORMAL LOW (ref 39.0–52.0)
Hemoglobin: 10.9 g/dL — ABNORMAL LOW (ref 13.0–17.0)
MCH: 33.2 pg (ref 26.0–34.0)
MCHC: 32 g/dL (ref 30.0–36.0)
MCV: 104 fL — ABNORMAL HIGH (ref 80.0–100.0)
Platelets: 54 10*3/uL — ABNORMAL LOW (ref 150–400)
RBC: 3.28 MIL/uL — ABNORMAL LOW (ref 4.22–5.81)
RDW: 14.4 % (ref 11.5–15.5)
WBC: 5 10*3/uL (ref 4.0–10.5)
nRBC: 0 % (ref 0.0–0.2)

## 2023-06-19 LAB — GLUCOSE, CAPILLARY: Glucose-Capillary: 119 mg/dL — ABNORMAL HIGH (ref 70–99)

## 2023-06-19 LAB — MAGNESIUM: Magnesium: 2.1 mg/dL (ref 1.7–2.4)

## 2023-06-19 NOTE — Discharge Summary (Signed)
Physician Discharge Summary  Sean [name] MWN:[id number] DOB: 04/26/1971 DOA: 06/17/2023  PCP: Lula Olszewski, MD  Admit date: 06/17/2023 Discharge date: 06/19/2023  Admitted From: Home  Discharge disposition: Home    Recommendations for Outpatient Follow-Up:   Follow up with your primary care provider in one week.  Check CBC, BMP, magnesium in the next visit Follow up with Nephrology on biopsy report  Discharge Diagnosis:   Principal Problem:   AKI (acute kidney injury) (HCC) Active Problems:   Thrombocytopenia (HCC)   MGUS (monoclonal gammopathy of unknown significance)   Discharge Condition: Improved.  Diet recommendation: Low sodium, heart healthy.  Carbohydrate-modified.  Wound care: None.  Code status: Full.   History of Present Illness:   Sean [name] is a 52 y.o. male with medical history significant of CKD stage IIIb, MGUS with chronic thrombocytopenia, CAD status post CABG, chronic HFrEF with recovered LVEF, PAF on Eliquis, HTN, HLD, insulin-dependent diabetes mellitus was sent from nephrologist office for elective kidney biopsy. Patient has a history of MGUS with chronic thrombocytopenia underwent bone biopsy May 2024.  He is not on any chemotherapy.  Recently patient has worsening of kidney function in the last 8 months with trending up up creatinines from 2.3 to 3.06 weeks ago, and nephrology was concerned about renal amyloidosis and requested IR guided biopsy. Most recent platelet count 55 on May 10 patient denied any easy bruising or bleeding.  Patient follows up with Dr. Malen Gauze nephrology as outpatient.    Hospital Course:   Following conditions were addressed during hospitalization as listed below,  Acute kidney injury.  Status post CT-guided random right renal biopsy on 06/18/2023.  Nephrology had recommended biopsy for suspicion of amyloidosis.  Patient was observed overnight without hematuria and hemoglobin stable. Checked with IR and ok  for discharge.   Patient follows up with Dr. Malen Gauze nephrology as outpatient. Will need to follow for biopsy report.    Chronic thrombocytopenia Latest platelet count of 55.  Status postrenal biopsy.     History of PAF Continue to hold Eliquis.   AKI on CKD stage IIIa  Euvolemic.  Creatinine of 3.3.  Will need outpatient nephrology follow-up after discharge.   Chronic HFrEF with recovered LVEF -Euvolemic, most recent stress test in May 2024 showed recovered LVEF.  Continue Cardizem, losartan   Diabetes mellitus type 2. Diabetic diet and  metformin.   Morbid obesity Body mass index is 52.61 kg/m.  Would benefit from weight loss as outpatient.   MGUS -Under active surveillance by oncology  Disposition.  At this time, patient is stable for disposition home with outpatient nephrology follow up.  Medical Consultants:   Interventional Radiology  Procedures:    Status post CT-guided random right renal biopsy on 06/18/2023.  Subjective:   Today, patient was seen and examined at bedside. No pain, hematuria. No other complains.   Discharge Exam:   Vitals:   06/19/23 0900 06/19/23 1019  BP: (!) 106/58 129/64  Pulse: (!) 58 (!) 52  Resp: 19   Temp: (!) 97.5 F (36.4 C)   SpO2: 98%    Vitals:   06/18/23 2035 06/18/23 2238 06/19/23 0900 06/19/23 1019  BP: (!) 122/50  (!) 106/58 129/64  Pulse: 66 66 (!) 58 (!) 52  Resp: 18 18 19    Temp: 97.9 F (36.6 C)  (!) 97.5 F (36.4 C)   TempSrc:   Oral   SpO2: 97% 97% 98%   Weight:      Height:  Body mass index is 52.61 kg/m.  General: Alert awake, not in obvious distress, obese built HENT: pupils equally reacting to light,  No scleral pallor or icterus noted. Oral mucosa is moist.  Chest:   Diminished breath sounds bilaterally. No crackles or wheezes.  CVS: S1 &S2 heard. No murmur.  Regular rate and rhythm. Abdomen: Soft, nontender, nondistended.  Bowel sounds are heard.   Loin without hematoma at biopsy  site. Extremities: No cyanosis, clubbing or edema.  Peripheral pulses are palpable. Psych: Alert, awake and oriented, normal mood CNS:  No cranial nerve deficits.  Power equal in all extremities.   Skin: Warm and dry.  No rashes noted.  The results of significant diagnostics from this hospitalization (including imaging, microbiology, ancillary and laboratory) are listed below for reference.     Diagnostic Studies:   CT RENAL BIOPSY  Result Date: 06/19/2023 INDICATION: 52 year old with chronic kidney disease stage 4 and proteinuria. Request for random renal biopsy. EXAM: CT-guided core biopsy of right kidney TECHNIQUE: Multidetector CT imaging of the abdomen was performed following the standard protocol without IV contrast. RADIATION DOSE REDUCTION: This exam was performed according to the departmental dose-optimization program which includes automated exposure control, adjustment of the mA and/or kV according to patient size and/or use of iterative reconstruction technique. MEDICATIONS: Moderate sedation ANESTHESIA/SEDATION: Moderate (conscious) sedation was employed during this procedure. A total of Versed 2 mg and Fentanyl 100 mcg was administered intravenously by the radiology nurse. Total intra-service moderate Sedation Time: 29 minutes. The patient's level of consciousness and vital signs were monitored continuously by radiology nursing throughout the procedure under my direct supervision. COMPLICATIONS: None immediate. PROCEDURE: Patient was initially evaluated in the ultrasound suite. Due to patient's body habitus, it was very difficult to evaluate the kidneys with ultrasound. Decision was made to perform the procedure in CT. Informed written consent was obtained from the patient after a thorough discussion of the procedural risks, benefits and alternatives. All questions were addressed. A timeout was performed prior to the initiation of the procedure. Patient was placed prone. CT images through  the abdomen were obtained. The right kidney lower pole was targeted for biopsy. Right flank was prepped with chlorhexidine and sterile field was created. Skin was anesthetized using 1% lidocaine. Small incision was made. Using CT guidance, 17 gauge coaxial needle was directed into the right kidney lower pole and was positioned along the edge of the cortex. Two core biopsies were obtained with an 18 gauge device. Two adequate specimens were obtained and placed in saline. Gel-Foam slurry was injected as the 17 gauge needle was removed. Follow up CT images were obtained. Bandage placed over the puncture site. FINDINGS: Core biopsies obtained from the right kidney lower pole. 2 adequate specimens obtained. IMPRESSION: Successful CT-guided random core biopsy of the right kidney. Electronically Signed   By: Richarda Overlie M.D.   On: 06/19/2023 05:13     Labs:   Basic Metabolic Panel: Recent Labs  Lab 06/17/23 1525 06/19/23 0945  NA 142 139  K 4.1 4.2  CL 110 110  CO2 24 23  GLUCOSE 120* 131*  BUN 36* 42*  CREATININE 3.35* 3.76*  CALCIUM 9.0 8.6*  MG  --  2.1   GFR Estimated Creatinine Clearance: 37.8 mL/min (A) (by C-G formula based on SCr of 3.76 mg/dL (H)). Liver Function Tests: No results for input(s): "AST", "ALT", "ALKPHOS", "BILITOT", "PROT", "ALBUMIN" in the last 168 hours. No results for input(s): "LIPASE", "AMYLASE" in the last 168 hours. No  results for input(s): "AMMONIA" in the last 168 hours. Coagulation profile Recent Labs  Lab 06/17/23 2337  INR 1.1    CBC: Recent Labs  Lab 06/17/23 1525 06/17/23 2337 06/18/23 0751 06/19/23 0945  WBC 5.0 5.3 4.7 5.0  NEUTROABS  --   --  2.5  --   HGB 12.0* 11.6* 11.5* 10.9*  HCT 36.3* 34.8* 35.7* 34.1*  MCV 102.0* 101.5* 101.1* 104.0*  PLT 63* 53* 55* 54*   Cardiac Enzymes: No results for input(s): "CKTOTAL", "CKMB", "CKMBINDEX", "TROPONINI" in the last 168 hours. BNP: Invalid input(s): "POCBNP" CBG: Recent Labs  Lab  06/17/23 1950 06/18/23 1248 06/18/23 1602 06/18/23 2038 06/19/23 0840  GLUCAP 106* 85 133* 143* 119*   D-Dimer No results for input(s): "DDIMER" in the last 72 hours. Hgb A1c Recent Labs    06/17/23 1525  HGBA1C 5.6   Lipid Profile No results for input(s): "CHOL", "HDL", "LDLCALC", "TRIG", "CHOLHDL", "LDLDIRECT" in the last 72 hours. Thyroid function studies No results for input(s): "TSH", "T4TOTAL", "T3FREE", "THYROIDAB" in the last 72 hours.  Invalid input(s): "FREET3" Anemia work up No results for input(s): "VITAMINB12", "FOLATE", "FERRITIN", "TIBC", "IRON", "RETICCTPCT" in the last 72 hours. Microbiology No results found for this or any previous visit (from the past 240 hour(s)).   Discharge Instructions:   Discharge Instructions     Call MD for:  persistant nausea and vomiting   Complete by: As directed    Call MD for:  severe uncontrolled pain   Complete by: As directed    Call MD for:  temperature >100.4   Complete by: As directed    Diet Carb Modified   Complete by: As directed    Discharge instructions   Complete by: As directed    Follow up with your nephrologists when scheduled for biopsy discussion and further treatment. Follow up with your primary care provider in one week. Check blood work at that time. Seek medical attention for worsening symptoms including bleeding.   Increase activity slowly   Complete by: As directed    No wound care   Complete by: As directed       Allergies as of 06/19/2023       Reactions   Vibra-tab [doxycycline] Shortness Of Breath   Glucophage [metformin] Other (See Comments)   Pt told by his nephrologist not to take this medication due to his kidney function.   Imdur [isosorbide Dinitrate] Diarrhea   Valtrex [valacyclovir] Itching   Augmentin [amoxicillin-pot Clavulanate] Itching   Tape Rash        Medication List     TAKE these medications    acetaminophen 500 MG tablet Commonly known as: TYLENOL Take  500-1,500 mg by mouth 2 (two) times daily as needed for moderate pain, fever, headache or mild pain.   allopurinol 100 MG tablet Commonly known as: ZYLOPRIM TAKE 1 TABLET(100 MG) BY MOUTH DAILY   amitriptyline 25 MG tablet Commonly known as: ELAVIL TAKE 1 TABLET(25 MG) BY MOUTH AT BEDTIME   apixaban 5 MG Tabs tablet Commonly known as: Eliquis Take 1 tablet (5 mg total) by mouth 2 (two) times daily.   diltiazem 120 MG 12 hr capsule Commonly known as: CARDIZEM SR TAKE 1 CAPSULE(120 MG) BY MOUTH TWICE DAILY   empagliflozin 10 MG Tabs tablet Commonly known as: Jardiance Take 1 tablet (10 mg total) by mouth daily before breakfast.   famotidine 20 MG tablet Commonly known as: PEPCID TAKE 1 TABLET(20 MG) BY MOUTH TWICE DAILY What changed: See  the new instructions.   losartan 25 MG tablet Commonly known as: COZAAR Take 1 tablet (25 mg total) by mouth daily.   montelukast 10 MG tablet Commonly known as: SINGULAIR TAKE 1 TABLET(10 MG) BY MOUTH DAILY   nitroGLYCERIN 0.4 MG SL tablet Commonly known as: Nitrostat Place 1 tablet (0.4 mg total) under the tongue every 5 (five) minutes as needed for chest pain.   pantoprazole 40 MG tablet Commonly known as: PROTONIX TAKE 1 TABLET(40 MG) BY MOUTH TWICE DAILY   Repatha SureClick 140 MG/ML Soaj Generic drug: Evolocumab INJECT THE CONTENTS OF 1 SYRINGE UNDER THE SKIN EVERY 14 DAYS   sucralfate 1 g tablet Commonly known as: CARAFATE TAKE 1 TABLET(1 GRAM) BY MOUTH TWICE DAILY. MAY INCREASE TO 4 TIMES DAILY FOR ANY ACUTE SYMPTOMS What changed: See the new instructions.   VITAMIN B-12 PO Place 1 tablet under the tongue daily.        Follow-up Information     Lula Olszewski, MD Follow up in 1 week(s).   Specialty: Internal Medicine Contact information: [address] [place] [address] [phone]                  Time coordinating discharge: 39 minutes  Signed:  Johnella Crumm  [facility] 06/19/2023, 3:03 PM

## 2023-06-19 NOTE — Progress Notes (Signed)
Discharge instructions reviewed with pt.   Copy of instructions given to pt. No changes in medications, no new scripts. Pt has called for his ride home and will be here soon, and will call pt's cell phone when she arrives at main entrance.  Pt getting dressed, plan to take to discharge lounge if ride has not called him by the time he is dressed.  Pt will be d/c'd via wheelchair with belongings and be escorted by staff.   Annice Needy, RN SWOT

## 2023-06-21 NOTE — Progress Notes (Signed)
Please arrange/confirm appointment for this if not already done.

## 2023-06-22 ENCOUNTER — Telehealth: Payer: Self-pay | Admitting: *Deleted

## 2023-06-22 NOTE — Telephone Encounter (Signed)
   Name: Sean [name]  DOB: [birth year]  MRN: [id number]   Primary Cardiologist: Christell Constant, MD  Chart reviewed as part of pre-operative protocol coverage. Patient was contacted 06/22/2023 in reference to pre-operative risk assessment for pending surgery as outlined below.  Sean [name] was last seen virtually on 06/05/2023 by Sean Levering, NP.  Since that day, Sean [name] has done well from a cardiac standpoint.  Therefore, based on ACC/AHA guidelines, the patient would be at acceptable risk for the planned procedure without further cardiovascular testing.   The patient was advised that if he develops new symptoms prior to surgery to contact our office to arrange for a follow-up visit, and he verbalized understanding.    Per office protocol, patient can hold Eliquis for 2 days prior to procedure as requested.  Please resume Eliquis as soon as possible postprocedure, at the discretion of the surgeon.   I will route this recommendation to the requesting party via Epic fax function and remove from pre-op pool. Please call with questions.  Joylene Grapes, NP 06/22/2023, 5:01 PM

## 2023-06-22 NOTE — Telephone Encounter (Signed)
Patient with diagnosis of aflutter on Eliquis for anticoagulation.    Procedure: liver biopsy Date of procedure: 06/26/23  CHA2DS2-VASc Score = 3  This indicates a 3.2% annual risk of stroke. The patient's score is based upon: CHF History: 1 HTN History: 1 Diabetes History: 0 Stroke History: 0 Vascular Disease History: 1 Age Score: 0 Gender Score: 0  CrCl 37mL/min using adjusted body weight due to obesity Platelet count 54K  Per office protocol, patient can hold Eliquis for 2 days prior to procedure as requested.    **This guidance is not considered finalized until pre-operative APP has relayed final recommendations.**

## 2023-06-22 NOTE — Telephone Encounter (Signed)
   Pre-operative Risk Assessment    Patient Name: Sean [name]  DOB: 10-05-1971 MRN: [id number]    PT RECENTLY CLEARED FOR KIDNEY Bx ON 06/05/23  Request for Surgical Clearance    Procedure:   LIVER Bx   Date of Surgery:  Clearance 06/26/23                                 Surgeon:  Surgeon's Group or Practice Name:  [facility] Phone number:  [phone] ATTN: Feliciana Rossetti Fax number:  [phone]   Type of Clearance Requested:   - Medical  - Pharmacy:  Hold Apixaban (Eliquis) x 4 DOSES    Type of Anesthesia:   MODERATE SEDATION   Additional requests/questions:    Elpidio Anis   06/22/2023, 4:07 PM

## 2023-06-23 ENCOUNTER — Encounter (HOSPITAL_COMMUNITY): Payer: Self-pay

## 2023-06-24 LAB — SURGICAL PATHOLOGY

## 2023-06-25 ENCOUNTER — Other Ambulatory Visit: Payer: Self-pay | Admitting: Student

## 2023-06-26 ENCOUNTER — Other Ambulatory Visit (HOSPITAL_COMMUNITY): Payer: Self-pay | Admitting: Nurse Practitioner

## 2023-06-26 ENCOUNTER — Ambulatory Visit (HOSPITAL_COMMUNITY)
Admission: RE | Admit: 2023-06-26 | Discharge: 2023-06-26 | Disposition: A | Discharge status: Home / Self Care | Payer: BC Managed Care – PPO | Source: Ambulatory Visit | Attending: Nurse Practitioner | Admitting: Nurse Practitioner

## 2023-06-26 ENCOUNTER — Other Ambulatory Visit: Payer: Self-pay

## 2023-06-26 DIAGNOSIS — N184 Chronic kidney disease, stage 4 (severe): Secondary | ICD-10-CM

## 2023-06-26 DIAGNOSIS — R7303 Prediabetes: Secondary | ICD-10-CM

## 2023-06-26 DIAGNOSIS — R748 Abnormal levels of other serum enzymes: Secondary | ICD-10-CM | POA: Diagnosis not present

## 2023-06-26 DIAGNOSIS — K7581 Nonalcoholic steatohepatitis (NASH): Secondary | ICD-10-CM | POA: Diagnosis not present

## 2023-06-26 DIAGNOSIS — I130 Hypertensive heart and chronic kidney disease with heart failure and stage 1 through stage 4 chronic kidney disease, or unspecified chronic kidney disease: Secondary | ICD-10-CM | POA: Diagnosis not present

## 2023-06-26 DIAGNOSIS — N183 Chronic kidney disease, stage 3 unspecified: Secondary | ICD-10-CM | POA: Insufficient documentation

## 2023-06-26 DIAGNOSIS — I5042 Chronic combined systolic (congestive) and diastolic (congestive) heart failure: Secondary | ICD-10-CM | POA: Insufficient documentation

## 2023-06-26 DIAGNOSIS — K730 Chronic persistent hepatitis, not elsewhere classified: Secondary | ICD-10-CM | POA: Diagnosis not present

## 2023-06-26 DIAGNOSIS — Z8249 Family history of ischemic heart disease and other diseases of the circulatory system: Secondary | ICD-10-CM | POA: Diagnosis not present

## 2023-06-26 DIAGNOSIS — K739 Chronic hepatitis, unspecified: Secondary | ICD-10-CM | POA: Diagnosis not present

## 2023-06-26 DIAGNOSIS — K769 Liver disease, unspecified: Secondary | ICD-10-CM | POA: Diagnosis not present

## 2023-06-26 DIAGNOSIS — K7400 Hepatic fibrosis, unspecified: Secondary | ICD-10-CM | POA: Diagnosis not present

## 2023-06-26 HISTORY — PX: IR US GUIDE VASC ACCESS RIGHT: IMG2390

## 2023-06-26 HISTORY — PX: IR TRANSCATHETER BX: IMG713

## 2023-06-26 HISTORY — PX: IR VENOGRAM HEPATIC W HEMODYNAMIC EVALUATION: IMG692

## 2023-06-26 LAB — PROTIME-INR
INR: 1.1 (ref 0.8–1.2)
Prothrombin Time: 14.5 seconds (ref 11.4–15.2)

## 2023-06-26 LAB — CBC
HCT: 33.3 % — ABNORMAL LOW (ref 39.0–52.0)
Hemoglobin: 10.9 g/dL — ABNORMAL LOW (ref 13.0–17.0)
MCH: 32.6 pg (ref 26.0–34.0)
MCHC: 32.7 g/dL (ref 30.0–36.0)
MCV: 99.7 fL (ref 80.0–100.0)
Platelets: 63 10*3/uL — ABNORMAL LOW (ref 150–400)
RBC: 3.34 MIL/uL — ABNORMAL LOW (ref 4.22–5.81)
RDW: 14.1 % (ref 11.5–15.5)
WBC: 5.3 10*3/uL (ref 4.0–10.5)
nRBC: 0 % (ref 0.0–0.2)

## 2023-06-26 LAB — BASIC METABOLIC PANEL
Anion gap: 10 (ref 5–15)
BUN: 44 mg/dL — ABNORMAL HIGH (ref 6–20)
CO2: 20 mmol/L — ABNORMAL LOW (ref 22–32)
Calcium: 8.5 mg/dL — ABNORMAL LOW (ref 8.9–10.3)
Chloride: 109 mmol/L (ref 98–111)
Creatinine, Ser: 3.81 mg/dL — ABNORMAL HIGH (ref 0.61–1.24)
GFR, Estimated: 18 mL/min — ABNORMAL LOW (ref >60)
Glucose, Bld: 114 mg/dL — ABNORMAL HIGH (ref 70–99)
Potassium: 3.8 mmol/L (ref 3.5–5.1)
Sodium: 139 mmol/L (ref 135–145)

## 2023-06-26 MED ORDER — FENTANYL CITRATE (PF) 100 MCG/2ML IJ SOLN
INTRAMUSCULAR | Status: AC | PRN
  Administered 2023-06-26 (×3): 50 ug via INTRAVENOUS
  Administered 2023-06-26: 25 ug via INTRAVENOUS
  Administered 2023-06-26: 50 ug via INTRAVENOUS
  Administered 2023-06-26: 25 ug via INTRAVENOUS

## 2023-06-26 MED ORDER — SODIUM CHLORIDE 0.9 % IV SOLN: INTRAVENOUS | Status: DC

## 2023-06-26 MED ORDER — FENTANYL CITRATE (PF) 100 MCG/2ML IJ SOLN
INTRAMUSCULAR | Status: AC
  Filled 2023-06-26: qty 2

## 2023-06-26 MED ORDER — MIDAZOLAM HCL 2 MG/2ML IJ SOLN
INTRAMUSCULAR | Status: AC
  Filled 2023-06-26: qty 2

## 2023-06-26 MED ORDER — IOHEXOL 300 MG/ML  SOLN
50.0000 mL | Freq: Once | INTRAMUSCULAR | Status: AC | PRN
  Administered 2023-06-26: 15 mL via INTRAVENOUS

## 2023-06-26 MED ORDER — MIDAZOLAM HCL 2 MG/2ML IJ SOLN
INTRAMUSCULAR | Status: AC
  Filled 2023-06-26: qty 4

## 2023-06-26 MED ORDER — FENTANYL CITRATE (PF) 100 MCG/2ML IJ SOLN
INTRAMUSCULAR | Status: AC
  Filled 2023-06-26: qty 4

## 2023-06-26 MED ORDER — MIDAZOLAM HCL 2 MG/2ML IJ SOLN
INTRAMUSCULAR | Status: AC | PRN
  Administered 2023-06-26 (×3): 1 mg via INTRAVENOUS
  Administered 2023-06-26: 2 mg via INTRAVENOUS

## 2023-06-26 MED ORDER — LIDOCAINE-EPINEPHRINE 1 %-1:100000 IJ SOLN
INTRAMUSCULAR | Status: AC
  Filled 2023-06-26: qty 1

## 2023-06-26 MED ORDER — LIDOCAINE-EPINEPHRINE 1 %-1:100000 IJ SOLN
20.0000 mL | Freq: Once | INTRAMUSCULAR | Status: AC
  Administered 2023-06-26: 5 mL via INTRADERMAL

## 2023-06-26 NOTE — Procedures (Signed)
Interventional Radiology Procedure Note  Procedure:   US guided right internal jugular vein access Portal vein pressure measurements TJ liver biopsy, multiple 20g core  Findings: IVC pressure: Right atrial pressure:  0-86mmHg Free hepatic vein pressure: Wedged hepatic vein pressure: 9-3= .   Portal vein pressure = , within normal  .  Complications: None  Recommendations:  - 2 hr observation - advance diet - Do not submerge for 7 days - 48 hours of no heavy lifting - routine wound care - follow up with Hepatology on schedule  Signed,  Yvone Neu. Loreta Ave, DO

## 2023-06-26 NOTE — H&P (Signed)
Chief Complaint: Patient was seen in consultation today for liver biopsy   Referring Physician(s): Drazek,Dawn  Supervising Physician: Gilmer Mor  Patient Status: [facility] - Out-pt  History of Present Illness: Sean [name] is a 52 y.o. male with hx of CKD, elevated liver enzymes and cardiac cirrhosis. He is being worked up by hepatic specialists and is referred for transjugular liver biopsy with pressures.  PMHx, meds, labs, imaging, allergies reviewed. Feels well, no recent fevers, chills, illness. Has been NPO today as directed. Family at bedside.   Past Medical History:  Diagnosis Date   Abnormal liver enzymes    a. Sees a doctor in [place].   Cardiac cirrhosis    a. possible elevated LFTs/low platelets felt due to cardiac cirrhosis per 2017 admission.   Chronic combined systolic and diastolic CHF (congestive heart failure) (HCC)    CKD (chronic kidney disease) stage 3, GFR 30-59 ml/min (HCC) 01/04/2016   Lab Results  Component  Value  Date/Time     GFR  29.24 (L)  09/30/2022 02:51 PM     GFR  27.27 (L)  09/24/2022 03:40 PM     GFR  24.10 (L)  09/03/2022 03:45 PM     GFR  18.98 (L)  08/25/2022 12:21 PM     GFR  38.98 (L)  04/24/2022 02:39 PM         CKD (chronic kidney disease), stage II    Stage 4   Congenital heart defect    a. rightward rotation of heart, almost dextrocardia   Coronary artery disease    a. s/p CABGx1 in 12/2015.   Gastric polyps 11/25/2022   Gastritis and gastroduodenitis 11/25/2022          Gastroesophageal reflux disease with esophagitis and hemorrhage 04/04/2021   Hematuria    a. Chronic hx of this, no prior etiology determined through workup per patient.   History of umbilical hernia repair 07/10/2011   History of hernia surgery twice prior   Hypercholesteremia    a. Prev taken off statin due to abnormal liver function.   Hypertension    Incisional hernia 07/10/2011   History of hernia surgery twice prior   Morbid obesity (HCC)     Morbid obesity with BMI of 50.0-59.9, adult (HCC) 01/04/2016   After [facility] GI doctor is setting him up with a wellness clinic to help with weight losS  He reports not having tried anything for weight loss either diet or or medicine or surgery in the past  We failed to get [facility] 10/2022      Wt Readings from Last 10 Encounters:  11/13/22  (!) 384 lb 12.8 oz (174.5 kg)  10/13/22  (!) 385 lb (174.6 kg)  09/30/22  (!) 384 lb (174.2 kg)  09/24/22  (!) 382 lb (1   Nausea and vomiting 11/25/2022   OSA on CPAP    Paroxysmal atrial flutter (HCC) 02/12/2016   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   LIMA to LAD   Sinus bradycardia    Thrombocytopenia (HCC)     Past Surgical History:  Procedure Laterality Date   APPENDECTOMY     BIOPSY  02/19/2021   Procedure: BIOPSY;  Surgeon: Tressia Danas, MD;  Location: [facility] ENDOSCOPY;  Service: Gastroenterology;;   BIOPSY  11/25/2022   Procedure: BIOPSY;  Surgeon: Tressia Danas, MD;  Location: [facility] ENDOSCOPY;  Service: Gastroenterology;;   CARDIAC CATHETERIZATION N/A 12/24/2015   Procedure: Right/Left Heart Cath and Coronary Angiography;  Surgeon: Dolores Patty, MD;  Location: [facility] INVASIVE CV LAB;  Service: Cardiovascular;  Laterality: N/A;   CARDIOVERSION N/A 02/14/2016   Procedure: CARDIOVERSION;  Surgeon: Chrystie Nose, MD;  Location: [facility] ENDOSCOPY;  Service: Cardiovascular;  Laterality: N/A;   COLONOSCOPY WITH PROPOFOL N/A 02/19/2021   Procedure: COLONOSCOPY WITH PROPOFOL;  Surgeon: Tressia Danas, MD;  Location: [facility] ENDOSCOPY;  Service: Gastroenterology;  Laterality: N/A;   CORONARY ARTERY BYPASS GRAFT N/A 12/26/2015   Procedure: Off Pump Coronary Artery Bypass Grafting times one using left internal mammary artery;  Surgeon: Purcell Nails, MD;  Location: [facility] OR;  Service: Open Heart Surgery;  Laterality: N/A;   ESOPHAGOGASTRODUODENOSCOPY (EGD) WITH PROPOFOL N/A 02/19/2021   Procedure: ESOPHAGOGASTRODUODENOSCOPY (EGD) WITH PROPOFOL;  Surgeon: Tressia Danas, MD;  Location: [facility] ENDOSCOPY;  Service: Gastroenterology;  Laterality: N/A;   ESOPHAGOGASTRODUODENOSCOPY (EGD) WITH PROPOFOL N/A 11/25/2022   Procedure: ESOPHAGOGASTRODUODENOSCOPY (EGD) WITH PROPOFOL;  Surgeon: Tressia Danas, MD;  Location: [facility] ENDOSCOPY;  Service: Gastroenterology;  Laterality: N/A;   GALLBLADDER SURGERY     HERNIA REPAIR     LEFT HEART CATH AND CORS/GRAFTS ANGIOGRAPHY N/A 04/15/2017   Procedure: Left Heart Cath and Cors/Grafts Angiography;  Surgeon: Lennette Bihari, MD;  Location: [facility] INVASIVE CV LAB;  Service: Cardiovascular;  Laterality: N/A;   LEFT HEART CATH AND CORS/GRAFTS ANGIOGRAPHY N/A 11/02/2020   Procedure: LEFT HEART CATH AND CORS/GRAFTS ANGIOGRAPHY;  Surgeon: Tonny Bollman, MD;  Location: [facility] INVASIVE CV LAB;  Service: Cardiovascular;  Laterality: N/A;   POLYPECTOMY  02/19/2021   Procedure: POLYPECTOMY;  Surgeon: Tressia Danas, MD;  Location: [facility] ENDOSCOPY;  Service: Gastroenterology;;   TEE WITHOUT CARDIOVERSION N/A 12/26/2015   Procedure: TRANSESOPHAGEAL ECHOCARDIOGRAM (TEE);  Surgeon: Purcell Nails, MD;  Location: [facility] OR;  Service: Open Heart Surgery;  Laterality: N/A;   TEE WITHOUT CARDIOVERSION N/A 02/14/2016   Procedure: TRANSESOPHAGEAL ECHOCARDIOGRAM (TEE);  Surgeon: Chrystie Nose, MD;  Location: [facility] ENDOSCOPY;  Service: Cardiovascular;  Laterality: N/A;    Allergies: Vibra-tab [doxycycline], Glucophage [metformin], Imdur [isosorbide dinitrate], Valtrex [valacyclovir], Augmentin [amoxicillin-pot clavulanate], and Tape  Medications: Prior to Admission medications   Medication Sig Start Date End Date Taking? Authorizing Provider  allopurinol (ZYLOPRIM) 100 MG tablet TAKE 1 TABLET(100 MG) BY MOUTH DAILY 04/27/23  Yes Lula Olszewski, MD  amitriptyline (ELAVIL) 25 MG tablet TAKE 1 TABLET(25 MG) BY MOUTH AT BEDTIME 03/24/23  Yes [place], Cala Bradford, MD  Cyanocobalamin (VITAMIN B-12 PO) Place 1 tablet under the tongue daily.   Yes [provider]  diltiazem (CARDIZEM SR) 120 MG 12 hr capsule TAKE 1 CAPSULE(120 MG) BY MOUTH TWICE DAILY 02/12/23  Yes Lula Olszewski, MD  empagliflozin (JARDIANCE) 10 MG TABS tablet Take 1 tablet (10 mg total) by mouth daily before breakfast. 10/13/22  Yes Lula Olszewski, MD  Evolocumab (REPATHA SURECLICK) 140 MG/ML SOAJ INJECT THE CONTENTS OF 1 SYRINGE UNDER THE SKIN EVERY 14 DAYS 12/17/22  Yes Meriam Sprague, MD  famotidine (PEPCID) 20 MG tablet TAKE 1 TABLET(20 MG) BY MOUTH TWICE DAILY Patient taking differently: Take 20 mg by mouth 2 (two) times daily as needed for heartburn. 03/24/23  Yes Tressia Danas, MD  losartan (COZAAR) 25 MG tablet Take 1 tablet (25 mg total) by mouth daily. 11/13/22  Yes Lula Olszewski, MD  montelukast (SINGULAIR) 10 MG tablet TAKE 1 TABLET(10 MG) BY MOUTH DAILY 05/24/23  Yes Lula Olszewski, MD  pantoprazole (PROTONIX) 40 MG tablet TAKE 1 TABLET(40 MG) BY MOUTH TWICE DAILY 03/24/23  Yes Lula Olszewski,  MD  sucralfate (CARAFATE) 1 g tablet TAKE 1 TABLET(1 GRAM) BY MOUTH TWICE DAILY. MAY INCREASE TO 4 TIMES DAILY FOR ANY ACUTE SYMPTOMS Patient taking differently: Take 1 tablet by mouth at bedtime. 11/26/22  Yes Tressia Danas, MD  acetaminophen (TYLENOL) 500 MG tablet Take 500-1,500 mg by mouth 2 (two) times daily as needed for moderate pain, fever, headache or mild pain.    [provider]  apixaban (ELIQUIS) 5 MG TABS tablet Take 1 tablet (5 mg total) by mouth 2 (two) times daily. 11/26/22   Tressia Danas, MD  nitroGLYCERIN (NITROSTAT) 0.4 MG SL tablet Place 1 tablet (0.4 mg total) under the tongue every 5 (five) minutes as needed for chest pain. 02/10/22   Christell Constant, MD     Family History  Problem Relation Age of Onset   CAD Father        4 stents ~ 63   Diabetes Father    Diabetes Maternal Grandfather    Diabetes Maternal Uncle    Heart attack Other    Hyperlipidemia Other     Social History   Socioeconomic  History   Marital status: Divorced    Spouse name: Not on file   Number of children: Not on file   Years of education: Not on file   Highest education level: Not on file  Occupational History   Occupation: [employer] doctor  Tobacco Use   Smoking status: Never   Smokeless tobacco: Never  Vaping Use   Vaping status: Never Used  Substance and Sexual Activity   Alcohol use: Yes    Comment: 6 beers per month   Drug use: No   Sexual activity: Not Currently  Other Topics Concern   Not on file  Social History Narrative   Truck driver -- regional   Lives with mom and dad   Social Determinants of Health   Financial Resource Strain: Not on file  Food Insecurity: No Food Insecurity (06/17/2023)   Hunger Vital Sign    Worried About Running Out of Food in the Last Year: Never true    Ran Out of Food in the Last Year: Never true  Transportation Needs: No Transportation Needs (06/17/2023)   PRAPARE - Administrator, Civil Service (Medical): No    Lack of Transportation (Non-Medical): No  Physical Activity: Not on file  Stress: Not on file  Social Connections: Unknown (08/26/2022)   [facility]   Social Network    Social Network: Not on file    Review of Systems: A 12 point ROS discussed and pertinent positives are indicated in the HPI above.  All other systems are negative.  Review of Systems  Vital Signs: There were no vitals taken for this visit.  Physical Exam Constitutional:      Appearance: He is not ill-appearing.  HENT:     Mouth/Throat:     Mouth: Mucous membranes are moist.     Pharynx: Oropharynx is clear.  Cardiovascular:     Rate and Rhythm: Normal rate and regular rhythm.     Heart sounds: Normal heart sounds.  Pulmonary:     Effort: Pulmonary effort is normal.     Breath sounds: Normal breath sounds.  Abdominal:     General: There is no distension.     Palpations: Abdomen is soft.     Tenderness: There is no abdominal tenderness.   Neurological:     General: No focal deficit present.     Mental  Status: He is alert and oriented to person, place, and time.  Psychiatric:        Mood and Affect: Mood normal.        Thought Content: Thought content normal.     Imaging: CT RENAL BIOPSY  Result Date: 06/19/2023 INDICATION: 52 year old with chronic kidney disease stage 4 and proteinuria. Request for random renal biopsy. EXAM: CT-guided core biopsy of right kidney TECHNIQUE: Multidetector CT imaging of the abdomen was performed following the standard protocol without IV contrast. RADIATION DOSE REDUCTION: This exam was performed according to the departmental dose-optimization program which includes automated exposure control, adjustment of the mA and/or kV according to patient size and/or use of iterative reconstruction technique. MEDICATIONS: Moderate sedation ANESTHESIA/SEDATION: Moderate (conscious) sedation was employed during this procedure. A total of Versed 2 mg and Fentanyl 100 mcg was administered intravenously by the radiology nurse. Total intra-service moderate Sedation Time: 29 minutes. The patient's level of consciousness and vital signs were monitored continuously by radiology nursing throughout the procedure under my direct supervision. COMPLICATIONS: None immediate. PROCEDURE: Patient was initially evaluated in the ultrasound suite. Due to patient's body habitus, it was very difficult to evaluate the kidneys with ultrasound. Decision was made to perform the procedure in CT. Informed written consent was obtained from the patient after a thorough discussion of the procedural risks, benefits and alternatives. All questions were addressed. A timeout was performed prior to the initiation of the procedure. Patient was placed prone. CT images through the abdomen were obtained. The right kidney lower pole was targeted for biopsy. Right flank was prepped with chlorhexidine and sterile field was created. Skin was anesthetized using  1% lidocaine. Small incision was made. Using CT guidance, 17 gauge coaxial needle was directed into the right kidney lower pole and was positioned along the edge of the cortex. Two core biopsies were obtained with an 18 gauge device. Two adequate specimens were obtained and placed in saline. Gel-Foam slurry was injected as the 17 gauge needle was removed. Follow up CT images were obtained. Bandage placed over the puncture site. FINDINGS: Core biopsies obtained from the right kidney lower pole. 2 adequate specimens obtained. IMPRESSION: Successful CT-guided random core biopsy of the right kidney. Electronically Signed   By: Richarda Overlie M.D.   On: 06/19/2023 05:13    Labs:  CBC: Recent Labs    06/17/23 2337 06/18/23 0751 06/19/23 0945 06/26/23 0736  WBC 5.3 4.7 5.0 5.3  HGB 11.6* 11.5* 10.9* 10.9*  HCT 34.8* 35.7* 34.1* 33.3*  PLT 53* 55* 54* 63*    COAGS: Recent Labs    04/10/23 0000 06/17/23 2337  INR 1.00 1.1    BMP: Recent Labs    01/26/23 1617 04/03/23 1609 04/10/23 0000 06/17/23 1525 06/19/23 0945  NA 141 137 143 142 139  K 4.5 3.9 4.5 4.1 4.2  CL 110 109 111* 110 110  CO2 21* 20* 23* 24 23  GLUCOSE 130* 95  --  120* 131*  BUN 39* 39* 33* 36* 42*  CALCIUM 9.4 8.6* 9.2 9.0 8.6*  CREATININE 2.33* 3.02* 3.0* 3.35* 3.76*  GFRNONAA 33* 24*  --  21* 19*    LIVER FUNCTION TESTS: Recent Labs    09/24/22 1540 09/30/22 1451 11/25/22 0000 01/26/23 1617 04/03/23 1609 04/10/23 0000 05/04/23 1119  BILITOT 1.3* 1.2  --  1.1 0.8  --   --   AST 33 32  --  52* 53* 60* 36  ALT 30 29  --  71* 64*  74* 39  ALKPHOS 156* 161* 205* 165* 195* 227*  --   PROT 6.2 6.8  --  7.0 6.4*  --   --   ALBUMIN 3.2* 3.5  --  3.2* 2.9* 3.4*  --      Assessment and Plan: Elevated liver enzymes Cardiac cirrhosis CKD For transjugular liver biopsy. Labs reviewed. Risks and benefits of transjugular liver biopsy was discussed with the patient and/or patient's family including, but not  limited to bleeding, infection, damage to adjacent structures or low yield requiring additional tests.  All of the questions were answered and there is agreement to proceed.  Consent signed and in chart.    Electronically Signed: Brayton El, PA-C 06/26/2023, 8:22 AM   I spent a total of 20 minutes in face to face in clinical consultation, greater than 50% of which was counseling/coordinating care for liver bx

## 2023-06-26 NOTE — Progress Notes (Signed)
Patient s/p transjugular liver biopsy today. Patient takes Eliquis BID. Per IR policy, patient may resume Eliquis 24 hours post procedure. Patient states his cardiologist told him to restart Eliquis on Monday.   IR recommends that patient defer to his cardiologist's recommendations.  Alwyn Ren, [place] [phone] 06/26/2023, 11:43 AM

## 2023-06-26 NOTE — Discharge Instructions (Signed)
-   Do not submerge for 7 days - 48 hours of no heavy lifting - follow up with Hepatology on schedule

## 2023-06-29 ENCOUNTER — Telehealth: Payer: Self-pay | Admitting: Student

## 2023-06-29 NOTE — Telephone Encounter (Signed)
Call received from the patient, states that he has been having RUQ pain since Friday after the bx.   Patient underwent transjugular random liver bx on Friday 06/26/23.  Patient states that he has been having right sided abdominal pain which comes and goes, he threw up onetime on Friday night but thinks it is due to indigestion. Also repost that he has not had BM since Friday. Hx of cholecystectomy. Denies symptoms of acute blood loss.   Informed the patient that the symptoms he is having are not related to the procedure, it is most likely due to constipation. Patient states that he takes medicine for constipation, he will give it a try today.  Encouraged the patient to contact IR or go to ED if he continues to have right sided abdominal pain, and develops other symptoms like N/V, fatigue, SOB, dizziness.  He verbalized understanding.   Please call IR for questions and concerns.   Lynann Bologna Vannessa Godown PA-C 06/29/2023 10:56 AM

## 2023-07-02 ENCOUNTER — Encounter: Payer: Self-pay | Admitting: Family

## 2023-07-02 ENCOUNTER — Ambulatory Visit (INDEPENDENT_AMBULATORY_CARE_PROVIDER_SITE_OTHER): Payer: BC Managed Care – PPO | Admitting: Family

## 2023-07-02 ENCOUNTER — Other Ambulatory Visit: Payer: Self-pay | Admitting: Nurse Practitioner

## 2023-07-02 VITALS — BP 120/63 | HR 63 | Temp 97.7°F | Ht 71.0 in | Wt 370.0 lb

## 2023-07-02 DIAGNOSIS — K7402 Hepatic fibrosis, advanced fibrosis: Secondary | ICD-10-CM

## 2023-07-02 DIAGNOSIS — E559 Vitamin D deficiency, unspecified: Secondary | ICD-10-CM

## 2023-07-02 DIAGNOSIS — K7581 Nonalcoholic steatohepatitis (NASH): Secondary | ICD-10-CM

## 2023-07-02 DIAGNOSIS — R5383 Other fatigue: Secondary | ICD-10-CM | POA: Diagnosis not present

## 2023-07-02 LAB — VITAMIN D 25 HYDROXY (VIT D DEFICIENCY, FRACTURES): VITD: 19.91 ng/mL — ABNORMAL LOW (ref 30.00–100.00)

## 2023-07-02 LAB — T4, FREE: Free T4: 0.8 ng/dL (ref 0.60–1.60)

## 2023-07-02 LAB — TSH: TSH: 10.55 u[IU]/mL — ABNORMAL HIGH (ref 0.35–5.50)

## 2023-07-02 NOTE — Progress Notes (Signed)
Patient ID: Sean [name], male    DOB: [birth year], 52 y.o.   MRN: [id number]  Chief Complaint  Patient presents with   Fatigue    Pt c/o fatigue since 7/26, Pt had a biopsy on kidneys and liver. Kidney were damaged due to HTN.     HPI:      Fatigue:  pt has multiple medical problems including heart disease, sleep apnea, anemia, thrombocytopenia (50-55K), kidney disease, morbid obesity. Per review of EMR he is followed by [facility], nephrology, hematology, GI and cardiology.  He reports he had a kidney biopsy & liver biopsy recently. Also reports bone marrow biopsy which was negative. Kidney biopsy showed scarring d/t HTN, but no cancer and he is still waiting to hear about his liver biopsy results. After having biopsies done he has been very weak and tired. He called the kidney provider about being tired and having dark urine. Told to take laxative and states he felt better, had good BM. But Tuesday this week he felt very weak and tired again. Reports his bowels move about every other or every 2 days. Reports taking Vit B12 daily, not taking Vit. D.   Assessment & Plan:  1. Other fatigue- advised pt he has a lot of different medical issues that can be contributing to his fatigue. Reinforced need for daily bowel management using stool softeners or generic Miralax (pt has at home) and drinking at least 2L of water daily. Exercising as able. Eating a low carb diet w/more lean protein and veges. Checking a few labs today to r/o other etiology. Advised he call [facility] re biopsy results.  - Vitamin D (25 hydroxy) - T4, free - TSH   Subjective:    Outpatient Medications Prior to Visit  Medication Sig Dispense Refill   acetaminophen (TYLENOL) 500 MG tablet Take 500-1,500 mg by mouth 2 (two) times daily as needed for moderate pain, fever, headache or mild pain.     allopurinol (ZYLOPRIM) 100 MG tablet TAKE 1 TABLET(100 MG) BY MOUTH DAILY 90 tablet 1   amitriptyline (ELAVIL)  25 MG tablet TAKE 1 TABLET(25 MG) BY MOUTH AT BEDTIME 30 tablet 5   apixaban (ELIQUIS) 5 MG TABS tablet Take 1 tablet (5 mg total) by mouth 2 (two) times daily. 180 tablet 1   Cyanocobalamin (VITAMIN B-12 PO) Place 1 tablet under the tongue daily.     diltiazem (CARDIZEM SR) 120 MG 12 hr capsule TAKE 1 CAPSULE(120 MG) BY MOUTH TWICE DAILY 180 capsule 0   empagliflozin (JARDIANCE) 10 MG TABS tablet Take 1 tablet (10 mg total) by mouth daily before breakfast. 90 tablet 3   Evolocumab (REPATHA SURECLICK) 140 MG/ML SOAJ INJECT THE CONTENTS OF 1 SYRINGE UNDER THE SKIN EVERY 14 DAYS 6 mL 3   famotidine (PEPCID) 20 MG tablet TAKE 1 TABLET(20 MG) BY MOUTH TWICE DAILY (Patient taking differently: Take 20 mg by mouth 2 (two) times daily as needed for heartburn.) 60 tablet 3   losartan (COZAAR) 25 MG tablet Take 1 tablet (25 mg total) by mouth daily. 90 tablet 3   montelukast (SINGULAIR) 10 MG tablet TAKE 1 TABLET(10 MG) BY MOUTH DAILY 90 tablet 1   nitroGLYCERIN (NITROSTAT) 0.4 MG SL tablet Place 1 tablet (0.4 mg total) under the tongue every 5 (five) minutes as needed for chest pain. 25 tablet 6   pantoprazole (PROTONIX) 40 MG tablet TAKE 1 TABLET(40 MG) BY MOUTH TWICE DAILY 180 tablet 0   sucralfate (CARAFATE) 1 g  tablet TAKE 1 TABLET(1 GRAM) BY MOUTH TWICE DAILY. MAY INCREASE TO 4 TIMES DAILY FOR ANY ACUTE SYMPTOMS (Patient taking differently: Take 1 tablet by mouth at bedtime.) 60 tablet 2   Facility-Administered Medications Prior to Visit  Medication Dose Route Frequency Provider Last Rate Last Admin   technetium tetrofosmin (TC-MYOVIEW) injection 29.8 millicurie  29.8 millicurie Intravenous Once PRN Hilty, Lisette Abu, MD       Past Medical History:  Diagnosis Date   Abnormal liver enzymes    a. Sees a doctor in [place].   Cardiac cirrhosis    a. possible elevated LFTs/low platelets felt due to cardiac cirrhosis per 2017 admission.   Chronic combined systolic and diastolic CHF (congestive heart  failure) (HCC)    CKD (chronic kidney disease) stage 3, GFR 30-59 ml/min (HCC) 01/04/2016   Lab Results  Component  Value  Date/Time     GFR  29.24 (L)  09/30/2022 02:51 PM     GFR  27.27 (L)  09/24/2022 03:40 PM     GFR  24.10 (L)  09/03/2022 03:45 PM     GFR  18.98 (L)  08/25/2022 12:21 PM     GFR  38.98 (L)  04/24/2022 02:39 PM         CKD (chronic kidney disease), stage II    Stage 4   Congenital heart defect    a. rightward rotation of heart, almost dextrocardia   Coronary artery disease    a. s/p CABGx1 in 12/2015.   Gastric polyps 11/25/2022   Gastritis and gastroduodenitis 11/25/2022          Gastroesophageal reflux disease with esophagitis and hemorrhage 04/04/2021   Hematuria    a. Chronic hx of this, no prior etiology determined through workup per patient.   History of umbilical hernia repair 07/10/2011   History of hernia surgery twice prior   Hypercholesteremia    a. Prev taken off statin due to abnormal liver function.   Hypertension    Incisional hernia 07/10/2011   History of hernia surgery twice prior   Morbid obesity (HCC)    Morbid obesity with BMI of 50.0-59.9, adult (HCC) 01/04/2016   After [place] [facility] doctor is setting him up with a wellness clinic to help with weight losS  He reports not having tried anything for weight loss either diet or or medicine or surgery in the past  We failed to get [facility] 10/2022      Wt Readings from Last 10 Encounters:  11/13/22  (!) 384 lb 12.8 oz (174.5 kg)  10/13/22  (!) 385 lb (174.6 kg)  09/30/22  (!) 384 lb (174.2 kg)  09/24/22  (!) 382 lb (1   Nausea and vomiting 11/25/2022   OSA on CPAP    Paroxysmal atrial flutter (HCC) 02/12/2016   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   LIMA to LAD   Sinus bradycardia    Thrombocytopenia (HCC)    Past Surgical History:  Procedure Laterality Date   APPENDECTOMY     BIOPSY  02/19/2021   Procedure: BIOPSY;  Surgeon: Tressia Danas, MD;  Location: [facility];  Service: Gastroenterology;;    BIOPSY  11/25/2022   Procedure: BIOPSY;  Surgeon: Tressia Danas, MD;  Location: [facility];  Service: Gastroenterology;;   CARDIAC CATHETERIZATION N/A 12/24/2015   Procedure: Right/Left Heart Cath and Coronary Angiography;  Surgeon: Dolores Patty, MD;  Location: [facility] INVASIVE CV LAB;  Service: Cardiovascular;  Laterality: N/A;   CARDIOVERSION N/A 02/14/2016   Procedure:  CARDIOVERSION;  Surgeon: Chrystie Nose, MD;  Location: [facility] ENDOSCOPY;  Service: Cardiovascular;  Laterality: N/A;   COLONOSCOPY WITH PROPOFOL N/A 02/19/2021   Procedure: COLONOSCOPY WITH PROPOFOL;  Surgeon: Tressia Danas, MD;  Location: [facility];  Service: Gastroenterology;  Laterality: N/A;   CORONARY ARTERY BYPASS GRAFT N/A 12/26/2015   Procedure: Off Pump Coronary Artery Bypass Grafting times one using left internal mammary artery;  Surgeon: Purcell Nails, MD;  Location: [facility] OR;  Service: Open Heart Surgery;  Laterality: N/A;   ESOPHAGOGASTRODUODENOSCOPY (EGD) WITH PROPOFOL N/A 02/19/2021   Procedure: ESOPHAGOGASTRODUODENOSCOPY (EGD) WITH PROPOFOL;  Surgeon: Tressia Danas, MD;  Location: [facility] END[facility];  Service: Gastroenterology;  Laterality: N/A;   ESOPHAGOGASTRODUODENOSCOPY (EGD) WITH PROPOFOL N/A 11/25/2022   Procedure: ESOPHAGOGASTRODUODENOSCOPY (EGD) WITH PROPOFOL;  Surgeon: Tressia Danas, MD;  Location: [facility];  Service: Gastroenterology;  Laterality: N/A;   GALLBLADDER SURGERY     HERNIA REPAIR     IR TRANSCATHETER BX  06/26/2023   IR US GUIDE VASC ACCESS RIGHT  06/26/2023   IR VENOGRAM HEPATIC W HEMODYNAMIC EVALUATION  06/26/2023   LEFT HEART CATH AND CORS/GRAFTS ANGIOGRAPHY N/A 04/15/2017   Procedure: Left Heart Cath and Cors/Grafts Angiography;  Surgeon: Lennette Bihari, MD;  Location: [facility] INVASIVE CV LAB;  Service: Cardiovascular;  Laterality: N/A;   LEFT HEART CATH AND CORS/GRAFTS ANGIOGRAPHY N/A 11/02/2020   Procedure: LEFT HEART CATH AND CORS/GRAFTS ANGIOGRAPHY;  Surgeon: Tonny Bollman,  MD;  Location: [facility] INVASIVE CV LAB;  Service: Cardiovascular;  Laterality: N/A;   POLYPECTOMY  02/19/2021   Procedure: POLYPECTOMY;  Surgeon: Tressia Danas, MD;  Location: [facility]OS[facility];  Service: Gastroenterology;;   TEE WITHOUT CARDIOVERSION N/A 12/26/2015   Procedure: TRANSESOPHAGEAL ECHOCARDIOGRAM (TEE);  Surgeon: Purcell Nails, MD;  Location: [facility] OR;  Service: Open Heart Surgery;  Laterality: N/A;   TEE WITHOUT CARDIOVERSION N/A 02/14/2016   Procedure: TRANSESOPHAGEAL ECHOCARDIOGRAM (TEE);  Surgeon: Chrystie Nose, MD;  Location: [facility] ENDOSCOPY;  Service: Cardiovascular;  Laterality: N/A;   Allergies  Allergen Reactions   Vibra-Tab [Doxycycline] Shortness Of Breath   Glucophage [Metformin] Other (See Comments)    Pt told by his nephrologist not to take this medication due to his kidney function.   Imdur [Isosorbide Dinitrate] Diarrhea   Valtrex [Valacyclovir] Itching   Augmentin [Amoxicillin-Pot Clavulanate] Itching   Tape Rash      Objective:    Physical Exam Vitals and nursing note reviewed.  Constitutional:      General: He is not in acute distress.    Appearance: Normal appearance. He is morbidly obese.  HENT:     Head: Normocephalic.  Cardiovascular:     Rate and Rhythm: Normal rate and regular rhythm.  Pulmonary:     Effort: Pulmonary effort is normal.     Breath sounds: Normal breath sounds.  Musculoskeletal:        General: Normal range of motion.     Cervical back: Normal range of motion.  Skin:    General: Skin is warm and dry.  Neurological:     Mental Status: He is alert and oriented to person, place, and time.  Psychiatric:        Mood and Affect: Mood normal.    BP 120/63 (BP Location: Right Arm, Patient Position: Sitting, Cuff Size: Large)   Pulse 63   Temp 97.7 F (36.5 C) (Temporal)   Ht 5\' 11"  (1.803 m)   Wt (!) 370 lb (167.8 kg)   SpO2 97%   BMI 51.60 kg/m  Wt Readings from Last 3 Encounters:  07/02/23 (!) 370 lb (167.8 kg)  06/17/23 (!)  377 lb 3.3 oz (171.1 kg)  05/04/23 (!) 374 lb (169.6 kg)       Dulce Sellar, NP

## 2023-07-04 ENCOUNTER — Other Ambulatory Visit: Payer: Self-pay | Admitting: Gastroenterology

## 2023-07-04 DIAGNOSIS — I10 Essential (primary) hypertension: Secondary | ICD-10-CM

## 2023-07-04 DIAGNOSIS — I2510 Atherosclerotic heart disease of native coronary artery without angina pectoris: Secondary | ICD-10-CM

## 2023-07-04 DIAGNOSIS — E7849 Other hyperlipidemia: Secondary | ICD-10-CM

## 2023-07-05 MED ORDER — VITAMIN D3 125 MCG (5000 UT) PO CAPS
1.0000 | ORAL_CAPSULE | Freq: Every day | ORAL | 1 refills | Status: DC
Start: 2023-07-05 — End: 2024-07-14

## 2023-07-05 NOTE — Addendum Note (Signed)
Addended byDulce Sellar on: 07/05/2023 08:35 PM   Modules accepted: Orders

## 2023-07-08 ENCOUNTER — Telehealth: Payer: Self-pay

## 2023-07-08 DIAGNOSIS — E782 Mixed hyperlipidemia: Secondary | ICD-10-CM

## 2023-07-08 NOTE — Telephone Encounter (Signed)
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

## 2023-07-08 NOTE — Telephone Encounter (Signed)
Pt does not have T2DM so insurance will not cover GLPs approved for diabetes. BCBS hasn't been covering weight loss GLP this year either. He does have hx of CAD - will try submitting coverage for [facility] under newer cardiovascular risk reduction indication. Key: [id number].

## 2023-07-08 NOTE — Telephone Encounter (Signed)
Called pt advised of MD recommendation for GLP-1.  Reports PCP has tried X2-3 times and insurance will not cover.  Will send a message to pharmacist to see if pt will qualify for GLP-1.  Pt reports if we can get medication covered would be more than happy to come in and talk with pharmacist.

## 2023-07-09 ENCOUNTER — Telehealth: Payer: Self-pay | Admitting: Internal Medicine

## 2023-07-09 NOTE — Telephone Encounter (Signed)
Patient is scheduled with Dr. Dimple Casey, rheumatology for 8/16 but is confused as to why he was referred to rheumatology specifically. Pt is requesting call back for an explanation.

## 2023-07-10 NOTE — Telephone Encounter (Signed)
See note

## 2023-07-10 NOTE — Telephone Encounter (Signed)
I received a message back from his insurance that they do not cover weight loss medications (even with [facility] being submitted for cardiovascular indication). They apparently only cover surgical treatment for obesity which does not make much sense. Could always retry for coverage again in the new year to see if their coverage has expanded to match with FDA approved indications.

## 2023-07-10 NOTE — Telephone Encounter (Signed)
Called pt advised of pharmacist response:  I received a message back from his insurance that they do not cover weight loss medications (even with [facility] being submitted for cardiovascular indication). They apparently only cover surgical treatment for obesity which does not make much sense. Could always retry for coverage again in the new year to see if their coverage has expanded to match with FDA approved indications.   Pt reports already knew medication would not be approved. Does not want to have weight loss surgery.  All questions answered.

## 2023-07-13 DIAGNOSIS — E8809 Other disorders of plasma-protein metabolism, not elsewhere classified: Secondary | ICD-10-CM | POA: Diagnosis not present

## 2023-07-13 DIAGNOSIS — I129 Hypertensive chronic kidney disease with stage 1 through stage 4 chronic kidney disease, or unspecified chronic kidney disease: Secondary | ICD-10-CM | POA: Diagnosis not present

## 2023-07-13 DIAGNOSIS — N184 Chronic kidney disease, stage 4 (severe): Secondary | ICD-10-CM | POA: Diagnosis not present

## 2023-07-13 DIAGNOSIS — R7303 Prediabetes: Secondary | ICD-10-CM | POA: Diagnosis not present

## 2023-07-17 ENCOUNTER — Encounter: Payer: BC Managed Care – PPO | Admitting: Internal Medicine

## 2023-07-17 ENCOUNTER — Ambulatory Visit: Admission: RE | Admit: 2023-07-17 | Payer: BC Managed Care – PPO | Source: Ambulatory Visit

## 2023-07-17 DIAGNOSIS — K7581 Nonalcoholic steatohepatitis (NASH): Secondary | ICD-10-CM | POA: Diagnosis not present

## 2023-07-17 DIAGNOSIS — K7402 Hepatic fibrosis, advanced fibrosis: Secondary | ICD-10-CM

## 2023-07-26 ENCOUNTER — Other Ambulatory Visit: Payer: Self-pay | Admitting: Oncology

## 2023-07-26 DIAGNOSIS — D472 Monoclonal gammopathy: Secondary | ICD-10-CM

## 2023-07-26 NOTE — Progress Notes (Signed)
[facility]  [address] [place],  [place]  [address] [phone]  Clinic Day:  07/27/2023  Referring physician: Lula Olszewski, MD   HISTORY OF PRESENT ILLNESS:  The patient is a 52 y.o. 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  He also has  MGUS as a recent bone marrow biopsy revealed  7% plasma cells.  Hematologic disorders have been followed conservatively and not make any changes in his health.  He comes in today for routine follow-up.  Since his last visit, the patient has been doing very.  Of note, since his last visit, the patient has had both a liver and kidney biopsy.  His liver biopsy did not reveal any evidence of steatosis or fatty liver disease.  Furthermore, his kidney biopsy did not reveal any evidence of light chain deposition to suggest myeloma kidney may be present.  The patient denies having any bruising/bleeding issues which concern him for having severe thrombocytopenia.  PHYSICAL EXAM:  Blood pressure (!) 172/80, pulse (!) 58, temperature 98.3 F (36.8 C), resp. rate 16, height 5\' 11"  (1.803 m), weight (!) 382 lb 14.4 oz (173.7 kg), SpO2 96%. Wt Readings from Last 3 Encounters:  07/27/23 (!) 382 lb 14.4 oz (173.7 kg)  07/02/23 (!) 370 lb (167.8 kg)  06/17/23 (!) 377 lb 3.3 oz (171.1 kg)   Body mass index is 53.4 kg/m. Performance status (ECOG): 1 - Symptomatic but completely ambulatory Physical Exam Constitutional:      Appearance: He is obese.  HENT:     Mouth/Throat:     Mouth: Mucous membranes are moist.     Pharynx: Oropharynx is clear. No oropharyngeal exudate or posterior oropharyngeal erythema.  Cardiovascular:     Rate and Rhythm: Regular rhythm. Tachycardia present.     Heart sounds: No murmur heard.    No friction rub. No gallop.  Pulmonary:     Effort: Pulmonary effort is normal. No respiratory distress.     Breath sounds: Normal breath sounds. No wheezing, rhonchi or rales.   Abdominal:     General: Bowel sounds are normal. There is no distension.     Palpations: Abdomen is soft. There is no mass.     Tenderness: There is no abdominal tenderness.  Musculoskeletal:        General: No swelling.     Right lower leg: No edema.     Left lower leg: No edema.  Lymphadenopathy:     Cervical: No cervical adenopathy.     Upper Body:     Right upper body: No supraclavicular or axillary adenopathy.     Left upper body: No supraclavicular or axillary adenopathy.     Lower Body: No right inguinal adenopathy. No left inguinal adenopathy.  Skin:    General: Skin is warm.     Coloration: Skin is not jaundiced.     Findings: No lesion or rash.  Neurological:     General: No focal deficit present.     Mental Status: He is alert and oriented to person, place, and time. Mental status is at baseline.  Psychiatric:        Mood and Affect: Mood normal.        Behavior: Behavior normal.        Thought Content: Thought content normal.  LABS:   Latest Reference Range & Units 07/27/23 00:00  Sodium 137 - 147  140 (E)  Potassium 3.5 - 5.1 mEq/L 4.4 (E)  Chloride 99 - 108  109 ! (E)  CO2 13 - 22  25 ! (E)  Glucose  101 (E)  BUN 4 - 21  45 ! (E)  Creatinine 0.6 - 1.3  3.2 ! (E)  Calcium 8.7 - 10.7  9.2 (E)  Alkaline Phosphatase 25 - 125  192 ! (E)  Albumin 3.5 - 5.0  3.7 (E)  AST 14 - 40  48 ! (E)  ALT 10 - 40 U/L 50 ! (E)  Bilirubin, Total  1.4 (E)  !: Data is abnormal (E): External lab result   Latest Reference Range & Units 07/27/23 10:22  M Protein SerPl Elph-Mcnc Not Observed g/dL 0.8 (H) (C)  (H): Data is abnormally high (C): Corrected   Latest Reference Range & Units 07/27/23 10:22  IgA 90 - 386 mg/dL 6,578 (H)  (H): Data is abnormally high   Latest Reference Range & Units 07/27/23 10:22  Kappa free light chain 3.3 - 19.4 mg/L 626.3 (H)  (H): Data is abnormally high  ASSESSMENT & PLAN:  Assessment/Plan:  A 52 y.o. 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  He also has MGUS.  His platelet count of 53 is essentially unchanged,  His monoclonal parameters remain elevated,but are essentially stable versus what they were previously.  Based upon his labs today, I do not get sense his MGUS is transforming into multiple myeloma.  His hemoglobin is mildly ower than what it has been previously.  This is likely related to his underlying kidney disease.  His anemia will be followed for now without any particular intervention.  Clinically, the patient is doing fairly well.  I will see him back in 6 months to reassess both his thrombocytopenia and his MGUS.  The patient understands all the plans discussed today and is in agreement with them.    Jeno Calleros Kirby Funk, MD

## 2023-07-27 ENCOUNTER — Inpatient Hospital Stay: Payer: BC Managed Care – PPO | Admitting: Oncology

## 2023-07-27 ENCOUNTER — Inpatient Hospital Stay: Payer: BC Managed Care – PPO | Attending: Oncology

## 2023-07-27 ENCOUNTER — Other Ambulatory Visit: Payer: BC Managed Care – PPO

## 2023-07-27 ENCOUNTER — Other Ambulatory Visit: Payer: Self-pay | Admitting: Oncology

## 2023-07-27 ENCOUNTER — Ambulatory Visit: Payer: BC Managed Care – PPO | Admitting: Oncology

## 2023-07-27 VITALS — BP 172/80 | HR 58 | Temp 98.3°F | Resp 16 | Ht 71.0 in | Wt 382.9 lb

## 2023-07-27 DIAGNOSIS — D472 Monoclonal gammopathy: Secondary | ICD-10-CM

## 2023-07-27 DIAGNOSIS — D649 Anemia, unspecified: Secondary | ICD-10-CM | POA: Insufficient documentation

## 2023-07-27 DIAGNOSIS — D693 Immune thrombocytopenic purpura: Secondary | ICD-10-CM | POA: Diagnosis not present

## 2023-07-27 LAB — CBC AND DIFFERENTIAL
HCT: 34 — AB (ref 41–53)
Hemoglobin: 11.6 — AB (ref 13.5–17.5)
Neutrophils Absolute: 2.41
Platelets: 53 10*3/uL — AB (ref 150–400)
WBC: 4.3

## 2023-07-27 LAB — CBC: RBC: 3.44 — AB (ref 3.87–5.11)

## 2023-07-27 LAB — COMPREHENSIVE METABOLIC PANEL
Albumin: 3.7 (ref 3.5–5.0)
Calcium: 9.2 (ref 8.7–10.7)

## 2023-07-27 LAB — HEPATIC FUNCTION PANEL
ALT: 50 U/L — AB (ref 10–40)
AST: 48 — AB (ref 14–40)
Alkaline Phosphatase: 192 — AB (ref 25–125)
Bilirubin, Total: 1.4

## 2023-07-27 LAB — BASIC METABOLIC PANEL
BUN: 45 — AB (ref 4–21)
CO2: 25 — AB (ref 13–22)
Chloride: 109 — AB (ref 99–108)
Creatinine: 3.2 — AB (ref 0.6–1.3)
Glucose: 101
Potassium: 4.4 mEq/L (ref 3.5–5.1)
Sodium: 140 (ref 137–147)

## 2023-07-28 ENCOUNTER — Telehealth: Payer: Self-pay | Admitting: Internal Medicine

## 2023-07-28 DIAGNOSIS — I10 Essential (primary) hypertension: Secondary | ICD-10-CM

## 2023-07-28 DIAGNOSIS — I2510 Atherosclerotic heart disease of native coronary artery without angina pectoris: Secondary | ICD-10-CM

## 2023-07-28 DIAGNOSIS — E7849 Other hyperlipidemia: Secondary | ICD-10-CM

## 2023-07-28 DIAGNOSIS — I4892 Unspecified atrial flutter: Secondary | ICD-10-CM

## 2023-07-28 LAB — KAPPA/LAMBDA LIGHT CHAINS
Kappa free light chain: 626.3 mg/L — ABNORMAL HIGH (ref 3.3–19.4)
Kappa, lambda light chain ratio: 24.75 — ABNORMAL HIGH (ref 0.26–1.65)
Lambda free light chains: 25.3 mg/L (ref 5.7–26.3)

## 2023-07-28 MED ORDER — APIXABAN 5 MG PO TABS
5.0000 mg | ORAL_TABLET | Freq: Two times a day (BID) | ORAL | 1 refills | Status: DC
Start: 2023-07-28 — End: 2024-01-18

## 2023-07-28 NOTE — Telephone Encounter (Signed)
*  STAT* If patient is at the pharmacy, call can be transferred to refill team.   1. Which medications need to be refilled? (please list name of each medication and dose if known)   apixaban (ELIQUIS) 5 MG TABS tablet    2. Which pharmacy/location (including street and city if local pharmacy) is medication to be sent to?  [facility] [phone] - [place], [place] - [address]      3. Do they need a 30 day or 90 day supply? 90 day    Pt is completely out of medication

## 2023-07-28 NOTE — Telephone Encounter (Signed)
Prescription refill request for Eliquis received. Indication:Afib  Last office visit: 06/05/23 ([place])  Scr: 3.2 (07/27/23)  Age: 52 Weight: 173.7kg  Appropriate dose. Refill sent.

## 2023-07-29 ENCOUNTER — Telehealth: Payer: Self-pay | Admitting: Internal Medicine

## 2023-07-29 NOTE — Telephone Encounter (Signed)
Prescription Request  07/29/2023  LOV: 05/04/2023  What is the name of the medication or equipment? diltiazem (CARDIZEM SR) 120 MG 12 hr capsule   Have you contacted your pharmacy to request a refill? Yes   Which pharmacy would you like this sent to?  [facility] [phone] - [place], [place] - [address] AT [facility] [address] [place] [address] Phone: [phone] Fax: [phone]    Patient notified that their request is being sent to the clinical staff for review and that they should receive a response within 2 business days.   Please advise at Mobile [phone] (mobile)

## 2023-07-30 LAB — PROTEIN ELECTROPHORESIS, SERUM
A/G Ratio: 1 (ref 0.7–1.7)
Albumin ELP: 3.2 g/dL (ref 2.9–4.4)
Alpha-1-Globulin: 0.3 g/dL (ref 0.0–0.4)
Alpha-2-Globulin: 0.6 g/dL (ref 0.4–1.0)
Beta Globulin: 1.7 g/dL — ABNORMAL HIGH (ref 0.7–1.3)
Gamma Globulin: 0.7 g/dL (ref 0.4–1.8)
Globulin, Total: 3.2 g/dL (ref 2.2–3.9)
M-Spike, %: 1 g/dL — ABNORMAL HIGH
Total Protein ELP: 6.4 g/dL (ref 6.0–8.5)

## 2023-07-30 NOTE — Telephone Encounter (Signed)
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 Please advise.

## 2023-07-31 NOTE — Telephone Encounter (Signed)
Requesting to have RX called in today asap (see previous message)

## 2023-08-02 LAB — MULTIPLE MYELOMA PANEL, SERUM
Albumin SerPl Elph-Mcnc: 3 g/dL (ref 2.9–4.4)
Albumin/Glob SerPl: 1.1 (ref 0.7–1.7)
Alpha 1: 0.2 g/dL (ref 0.0–0.4)
Alpha2 Glob SerPl Elph-Mcnc: 0.6 g/dL (ref 0.4–1.0)
B-Globulin SerPl Elph-Mcnc: 1.7 g/dL — ABNORMAL HIGH (ref 0.7–1.3)
Gamma Glob SerPl Elph-Mcnc: 0.5 g/dL (ref 0.4–1.8)
Globulin, Total: 3 g/dL (ref 2.2–3.9)
IgA: 1160 mg/dL — ABNORMAL HIGH (ref 90–386)
IgG (Immunoglobin G), Serum: 673 mg/dL (ref 603–1613)
IgM (Immunoglobulin M), Srm: 90 mg/dL (ref 20–172)
M Protein SerPl Elph-Mcnc: 0.8 g/dL — ABNORMAL HIGH
Total Protein ELP: 6 g/dL (ref 6.0–8.5)

## 2023-08-03 ENCOUNTER — Other Ambulatory Visit: Payer: Self-pay | Admitting: Oncology

## 2023-08-04 ENCOUNTER — Telehealth: Payer: Self-pay | Admitting: Internal Medicine

## 2023-08-04 ENCOUNTER — Other Ambulatory Visit: Payer: Self-pay

## 2023-08-04 DIAGNOSIS — I4892 Unspecified atrial flutter: Secondary | ICD-10-CM

## 2023-08-04 MED ORDER — FAMOTIDINE 20 MG PO TABS: 20.0000 mg | ORAL_TABLET | Freq: Two times a day (BID) | ORAL | 1 refills | Status: DC | PRN

## 2023-08-04 MED ORDER — DILTIAZEM HCL ER 120 MG PO CP12
120.0000 mg | ORAL_CAPSULE | Freq: Two times a day (BID) | ORAL | 1 refills | Status: DC
Start: 2023-08-04 — End: 2024-01-16

## 2023-08-04 NOTE — Telephone Encounter (Signed)
Patient's mother called for an update. I informed caller that medication was sent this morning and she verbalized understanding.

## 2023-08-04 NOTE — Telephone Encounter (Signed)
Calling to see if Dr. Salena Saner can recommend another PCP for the patient. Please advise

## 2023-08-04 NOTE — Telephone Encounter (Signed)
Sent refill to requested pharmacy

## 2023-08-04 NOTE — Telephone Encounter (Signed)
Famotidine refilled, [place] up to date on his office visits.

## 2023-08-04 NOTE — Telephone Encounter (Signed)
Pt's mother has called several times over the weekend to Access Nurse. Extremely upset rx has still not been ordered. Access Nurse tried to call pt & mother back several times but no answer. Please advise

## 2023-08-04 NOTE — Telephone Encounter (Signed)
Attempted to contact patient's mother, no answer and unable to leave message.  Will forward to Dr. Debby Bud nurse to follow-up.

## 2023-08-04 NOTE — Telephone Encounter (Signed)
Called patient's mother and was unable to leave a vm as it was full.

## 2023-08-05 ENCOUNTER — Telehealth: Payer: Self-pay | Admitting: Oncology

## 2023-08-05 NOTE — Telephone Encounter (Signed)
Patient has been scheduled. Aware of appt date and time.   Message Header  Department: [facility] [[id number]]   Scheduling Message Entered by Rennis Harding A on 08/03/2023 at  9:25 AM Priority: Routine <No visit type provided>  Department: [facility]  Provider:  Scheduling Notes:  Labs 01-20-24  Appt 01-27-24

## 2023-08-06 NOTE — Telephone Encounter (Signed)
Called pt advised mother's name is not on DPR.  Pt reports it's okay to speak with her (Sean [name]).  Reviewed 2 providers MD recommends with pt [name] and [name].  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  Called pt mom provided with MD recommendations.  No further concerns at this time.

## 2023-08-07 ENCOUNTER — Other Ambulatory Visit: Payer: Self-pay | Admitting: Nurse Practitioner

## 2023-08-10 DIAGNOSIS — G4733 Obstructive sleep apnea (adult) (pediatric): Secondary | ICD-10-CM | POA: Diagnosis not present

## 2023-08-10 DIAGNOSIS — J301 Allergic rhinitis due to pollen: Secondary | ICD-10-CM | POA: Diagnosis not present

## 2023-08-10 DIAGNOSIS — E662 Morbid (severe) obesity with alveolar hypoventilation: Secondary | ICD-10-CM | POA: Diagnosis not present

## 2023-08-17 ENCOUNTER — Other Ambulatory Visit: Payer: Self-pay | Admitting: Internal Medicine

## 2023-08-17 DIAGNOSIS — R1013 Epigastric pain: Secondary | ICD-10-CM

## 2023-09-04 ENCOUNTER — Other Ambulatory Visit: Payer: Self-pay | Admitting: Internal Medicine

## 2023-09-07 ENCOUNTER — Other Ambulatory Visit: Payer: Self-pay | Admitting: Nurse Practitioner

## 2023-09-12 ENCOUNTER — Other Ambulatory Visit: Payer: Self-pay | Admitting: Nurse Practitioner

## 2023-09-14 ENCOUNTER — Other Ambulatory Visit: Payer: Self-pay | Admitting: *Deleted

## 2023-09-14 MED ORDER — AMITRIPTYLINE HCL 25 MG PO TABS: 25.0000 mg | ORAL_TABLET | Freq: Every day | ORAL | 5 refills | Status: DC

## 2023-10-08 ENCOUNTER — Encounter: Payer: Self-pay | Admitting: Family

## 2023-10-08 ENCOUNTER — Ambulatory Visit: Payer: BC Managed Care – PPO | Admitting: Family

## 2023-10-08 VITALS — BP 151/69 | HR 71 | Temp 98.0°F | Ht 71.0 in | Wt 384.0 lb

## 2023-10-08 DIAGNOSIS — J0110 Acute frontal sinusitis, unspecified: Secondary | ICD-10-CM | POA: Diagnosis not present

## 2023-10-08 MED ORDER — TRIAMCINOLONE ACETONIDE 55 MCG/ACT NA AERO: 1.0000 | INHALATION_SPRAY | Freq: Every day | NASAL | 2 refills | Status: DC

## 2023-10-08 MED ORDER — METHYLPREDNISOLONE ACETATE 80 MG/ML IJ SUSP
80.0000 mg | Freq: Once | INTRAMUSCULAR | Status: AC
Start: 2023-10-08 — End: 2023-10-08
  Administered 2023-10-08: 80 mg via INTRAMUSCULAR

## 2023-10-08 MED ORDER — AZITHROMYCIN 250 MG PO TABS: ORAL_TABLET | ORAL | 0 refills | Status: AC

## 2023-10-08 NOTE — Progress Notes (Signed)
Patient ID: Sean [name], male    DOB: 06/01/1971, 52 y.o.   MRN: [id number]  Chief Complaint  Patient presents with   Sinus Problem    Pt c/o head and behind eye pressure, drowsy, SOB, Nasal congestion and vomiting. Present for about a week.    Discussed the use of AI scribe software for clinical note transcription with the patient, who gave verbal consent to proceed.  History of Present Illness   The patient, with a history of sleep apnea and weight issues, presents with pressure behind the eyes and sinus-related symptoms. The patient describes the pressure as feeling like his head is going to explode. These symptoms have been recurring periodically, particularly during deer and [place] season, which the patient attributes to allergies. The patient also reports experiencing sinus drainage and causing vomiting. He uses a CPAP machine for sleep apnea and has noticed an increase in drainage since using the machine. He states his pulmonologist wants to do allergy testing at his next appointment and possibly start allergy injections to desensitize. He has previously been treated with steroid shots and a Z-Pak for these symptoms in the past. He also mentions a history of weight issues and has previously been on a weight loss medication, Zepbound, which was discontinued due to insurance issues, but he did lose weight on the 2.5mg  dose.     Assessment & Plan:     Allergic Rhinitis with Sinusitis - Recurrent sinus pressure and pain during allergy seasons, with symptoms including pressure behind the eyes, headache, nasal drainage, and blood-tinged mucus. Current episode includes significant discomfort and vomiting. Differential includes sinusitis secondary to allergic rhinitis. Discussed Flonase or Nasacort nasal spray at symptom onset to prevent infection and reduce pain.  - Administer steroid injection - Prescribing Zpack, pt has allergy to PCN & DOXY. - Sending Nasacort nasal spray to use at  symptom onset to help try & prevent infection. - Encourage saline nasal spray use tid & prn for disinfection & moisture. - Pt advised on use & SE of all meds. - Follow up with Pulmonary or allergy specialist for allergy testing and potential immunotherapy.   Morbid Obesity - Pt reports hx of PCP trying to order Zepbound but it was denied by his ins. as they do not cover any weight loss med. Pt states he heard from another provider that if the coding was right the medication could be covered. I advised that [facility] Lutes does have an indication now for existing cardiac pts to use med to prevent another negative cardiac event, however, this is contraindicated for him due to his renal failure, and possibility of exacerbation this condition. Advised pt to f/u with our [facility] clinic as he was referred earlier in the year to provide further guidance. Also advised on wt. Loss strategies including portion control, less carbs including sweets, eating most of calories earlier in day, drinking 64oz water qd, and establishing daily exercise routine.     Subjective:    Outpatient Medications Prior to Visit  Medication Sig Dispense Refill   acetaminophen (TYLENOL) 500 MG tablet Take 500-1,500 mg by mouth 2 (two) times daily as needed for moderate pain, fever, headache or mild pain.     allopurinol (ZYLOPRIM) 100 MG tablet TAKE 1 TABLET(100 MG) BY MOUTH DAILY 90 tablet 1   amitriptyline (ELAVIL) 25 MG tablet Take 1 tablet (25 mg total) by mouth at bedtime. 30 tablet 5   apixaban (ELIQUIS) 5 MG TABS tablet Take 1 tablet (5 mg total)  by mouth 2 (two) times daily. 180 tablet 1   Cholecalciferol (VITAMIN D3) 125 MCG (5000 UT) CAPS Take 1 capsule (5,000 Units total) by mouth daily at 12 noon. 90 capsule 1   Cyanocobalamin (VITAMIN B-12 PO) Place 1 tablet under the tongue daily.     diltiazem (CARDIZEM SR) 120 MG 12 hr capsule Take 1 capsule (120 mg total) by mouth 2 (two) times daily. 180 capsule 1   empagliflozin (JARDIANCE)  10 MG TABS tablet Take 1 tablet (10 mg total) by mouth daily before breakfast. 90 tablet 3   Evolocumab (REPATHA SURECLICK) 140 MG/ML SOAJ INJECT THE CONTENTS OF 1 SYRINGE UNDER THE SKIN EVERY 14 DAYS 6 mL 3   famotidine (PEPCID) 20 MG tablet TAKE 1 TABLET(20 MG) BY MOUTH TWICE DAILY AS NEEDED FOR HEARTBURN 180 tablet 1   losartan (COZAAR) 25 MG tablet Take 1 tablet (25 mg total) by mouth daily. 90 tablet 3   montelukast (SINGULAIR) 10 MG tablet TAKE 1 TABLET(10 MG) BY MOUTH DAILY 90 tablet 1   nitroGLYCERIN (NITROSTAT) 0.4 MG SL tablet Place 1 tablet (0.4 mg total) under the tongue every 5 (five) minutes as needed for chest pain. 25 tablet 6   pantoprazole (PROTONIX) 40 MG tablet TAKE 1 TABLET(40 MG) BY MOUTH TWICE DAILY 180 tablet 0   sucralfate (CARAFATE) 1 g tablet TAKE 1 TABLET(1 GRAM) BY MOUTH TWICE DAILY. MAY INCREASE TO 4 TIMES DAILY FOR ANY ACUTE SYMPTOMS (Patient taking differently: Take 1 tablet by mouth at bedtime.) 60 tablet 2   Facility-Administered Medications Prior to Visit  Medication Dose Route Frequency Provider Last Rate Last Admin   technetium tetrofosmin (TC-MYOVIEW) injection 29.8 millicurie  29.8 millicurie Intravenous Once PRN Hilty, Lisette Abu, MD       Past Medical History:  Diagnosis Date   Abnormal liver enzymes    a. Sees a doctor in [place].   Cardiac cirrhosis    a. possible elevated LFTs/low platelets felt due to cardiac cirrhosis per 2017 admission.   Chronic combined systolic and diastolic CHF (congestive heart failure) (HCC)    CKD (chronic kidney disease) stage 3, GFR 30-59 ml/min (HCC) 01/04/2016   Lab Results  Component  Value  Date/Time     GFR  29.24 (L)  09/30/2022 02:51 PM     GFR  27.27 (L)  09/24/2022 03:40 PM     GFR  24.10 (L)  09/03/2022 03:45 PM     GFR  18.98 (L)  08/25/2022 12:21 PM     GFR  38.98 (L)  04/24/2022 02:39 PM         CKD (chronic kidney disease), stage II    Stage 4   Congenital heart defect    a. rightward rotation of heart,  almost dextrocardia   Coronary artery disease    a. s/p CABGx1 in 12/2015.   Gastric polyps 11/25/2022   Gastritis and gastroduodenitis 11/25/2022          Gastroesophageal reflux disease with esophagitis and hemorrhage 04/04/2021   Hematuria    a. Chronic hx of this, no prior etiology determined through workup per patient.   History of umbilical hernia repair 07/10/2011   History of hernia surgery twice prior   Hypercholesteremia    a. Prev taken off statin due to abnormal liver function.   Hypertension    Incisional hernia 07/10/2011   History of hernia surgery twice prior   Morbid obesity (HCC)    Morbid obesity with BMI of 50.0-59.9, adult (HCC)  01/04/2016   After [facility] [facility] doctor is setting him up with a wellness clinic to help with weight losS  He reports not having tried anything for weight loss either diet or or medicine or surgery in the past  We failed to get [facility] 10/2022      Wt Readings from Last 10 Encounters:  11/13/22  (!) 384 lb 12.8 oz (174.5 kg)  10/13/22  (!) 385 lb (174.6 kg)  09/30/22  (!) 384 lb (174.2 kg)  09/24/22  (!) 382 lb (1   Nausea and vomiting 11/25/2022   OSA on CPAP    Paroxysmal atrial flutter (HCC) 02/12/2016   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   LIMA to LAD   Sinus bradycardia    Thrombocytopenia (HCC)    Past Surgical History:  Procedure Laterality Date   APPENDECTOMY     BIOPSY  02/19/2021   Procedure: BIOPSY;  Surgeon: Tressia Danas, MD;  Location: [facility];  Service: Gastroenterology;;   BIOPSY  11/25/2022   Procedure: BIOPSY;  Surgeon: Tressia Danas, MD;  Location: [facility];  Service: Gastroenterology;;   CARDIAC CATHETERIZATION N/A 12/24/2015   Procedure: Right/Left Heart Cath and Coronary Angiography;  Surgeon: Dolores Patty, MD;  Location: [facility] INVASIVE CV LAB;  Service: Cardiovascular;  Laterality: N/A;   CARDIOVERSION N/A 02/14/2016   Procedure: CARDIOVERSION;  Surgeon: Chrystie Nose, MD;  Location: [facility] ENDOSCOPY;   Service: Cardiovascular;  Laterality: N/A;   COLONOSCOPY WITH PROPOFOL N/A 02/19/2021   Procedure: COLONOSCOPY WITH PROPOFOL;  Surgeon: Tressia Danas, MD;  Location: [facility] ENDOSCOPY;  Service: Gastroenterology;  Laterality: N/A;   CORONARY ARTERY BYPASS GRAFT N/A 12/26/2015   Procedure: Off Pump Coronary Artery Bypass Grafting times one using left internal mammary artery;  Surgeon: Purcell Nails, MD;  Location: [facility] OR;  Service: Open Heart Surgery;  Laterality: N/A;   ESOPHAGOGASTRODUODENOSCOPY (EGD) WITH PROPOFOL N/A 02/19/2021   Procedure: ESOPHAGOGASTRODUODENOSCOPY (EGD) WITH PROPOFOL;  Surgeon: Tressia Danas, MD;  Location: [facility] ENDOSCOPY;  Service: Gastroenterology;  Laterality: N/A;   ESOPHAGOGASTRODUODENOSCOPY (EGD) WITH PROPOFOL N/A 11/25/2022   Procedure: ESOPHAGOGASTRODUODENOSCOPY (EGD) WITH PROPOFOL;  Surgeon: Tressia Danas, MD;  Location: [facility] ENDOSCOPY;  Service: Gastroenterology;  Laterality: N/A;   GALLBLADDER SURGERY     HERNIA REPAIR     IR TRANSCATHETER BX  06/26/2023   IR US GUIDE VASC ACCESS RIGHT  06/26/2023   IR VENOGRAM HEPATIC W HEMODYNAMIC EVALUATION  06/26/2023   LEFT HEART CATH AND CORS/GRAFTS ANGIOGRAPHY N/A 04/15/2017   Procedure: Left Heart Cath and Cors/Grafts Angiography;  Surgeon: Lennette Bihari, MD;  Location: [facility] INVASIVE CV LAB;  Service: Cardiovascular;  Laterality: N/A;   LEFT HEART CATH AND CORS/GRAFTS ANGIOGRAPHY N/A 11/02/2020   Procedure: LEFT HEART CATH AND CORS/GRAFTS ANGIOGRAPHY;  Surgeon: Tonny Bollman, MD;  Location: [facility] INVASIVE CV LAB;  Service: Cardiovascular;  Laterality: N/A;   POLYPECTOMY  02/19/2021   Procedure: POLYPECTOMY;  Surgeon: Tressia Danas, MD;  Location: [facility] ENDOSCOPY;  Service: Gastroenterology;;   TEE WITHOUT CARDIOVERSION N/A 12/26/2015   Procedure: TRANSESOPHAGEAL ECHOCARDIOGRAM (TEE);  Surgeon: Purcell Nails, MD;  [facility] OR;  Service: Open Heart Surgery;  Laterality: N/A;   TEE WITHOUT CARDIOVERSION N/A 02/14/2016    Procedure: TRANSESOPHAGEAL ECHOCARDIOGRAM (TEE);  Surgeon: Chrystie Nose, MD;  Location: [facility] ENDOSCOPY;  Service: Cardiovascular;  Laterality: N/A;   Allergies  Allergen Reactions   Vibra-Tab [Doxycycline] Shortness Of Breath   Glucophage [Metformin] Other (See Comments)    Pt told by his nephrologist  not to take this medication due to his kidney function.   Imdur [Isosorbide Dinitrate] Diarrhea   Valtrex [Valacyclovir] Itching   Augmentin [Amoxicillin-Pot Clavulanate] Itching   Tape Rash      Objective:    Physical Exam Vitals and nursing note reviewed.  Constitutional:      General: He is not in acute distress.    Appearance: Normal appearance. He is morbidly obese. He is not ill-appearing.  HENT:     Head: Normocephalic.     Right Ear: Tympanic membrane and ear canal normal.     Left Ear: Tympanic membrane and ear canal normal.     Nose:     Right Sinus: Frontal sinus tenderness present. No maxillary sinus tenderness.     Left Sinus: Frontal sinus tenderness present. No maxillary sinus tenderness.     Mouth/Throat:     Mouth: Mucous membranes are moist.     Pharynx: No pharyngeal swelling, oropharyngeal exudate, posterior oropharyngeal erythema or uvula swelling.     Tonsils: No tonsillar exudate or tonsillar abscesses.  Cardiovascular:     Rate and Rhythm: Normal rate and regular rhythm.  Pulmonary:     Effort: Pulmonary effort is normal.     Breath sounds: Normal breath sounds.  Musculoskeletal:        General: Normal range of motion.     Cervical back: Normal range of motion.  Lymphadenopathy:     Head:     Right side of head: No preauricular or posterior auricular adenopathy.     Left side of head: No preauricular or posterior auricular adenopathy.     Cervical: No cervical adenopathy.  Skin:    General: Skin is warm and dry.  Neurological:     Mental Status: He is alert and oriented to person, place, and time.  Psychiatric:        Mood and Affect: Mood  normal.    There were no vitals taken for this visit. Wt Readings from Last 3 Encounters:  07/27/23 (!) 382 lb 14.4 oz (173.7 kg)  07/02/23 (!) 370 lb (167.8 kg)  06/17/23 (!) 377 lb 3.3 oz (171.1 kg)   *Extra time spent ( ) with patient today which consisted of chart review, discussing diagnoses, work up, treatment, answering questions, and documentation.   Dulce Sellar, NP

## 2023-10-13 ENCOUNTER — Other Ambulatory Visit: Payer: Self-pay | Admitting: Internal Medicine

## 2023-10-13 DIAGNOSIS — N184 Chronic kidney disease, stage 4 (severe): Secondary | ICD-10-CM

## 2023-10-20 ENCOUNTER — Telehealth: Payer: Self-pay | Admitting: Internal Medicine

## 2023-10-20 NOTE — Telephone Encounter (Signed)
Called pt mother in regards to leg swelling.  Pt ended up on the phone.  Reports swelling started about 6 months ago. Reports does not eat salt.  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 Advised pt take out food is very high in sodium and when salt goes water goes which equals swelling.  Advised pt the key to helping with swelling is to make better food choices.  If eats fast food try salad with grilled chicken.  Also suggested meal prepping prior to road trips.   Reports wears compression every day since 2017 BP 120-130/70's.  Checks BP about once a week.  Pt would like MD recommendation.  Will send to MD to advise.

## 2023-10-20 NOTE — Telephone Encounter (Signed)
Pt c/o swelling/edema: STAT if pt has developed SOB within 24 hours  If swelling, where is the swelling located?   Both legs and feet, more so in the right   How much weight have you gained and in what time span?   Unsure  Have you gained 2 pounds in a day or 5 pounds in a week? Unsure  Do you have a log of your daily weights (if so, list)?   No  Are you currently taking a fluid pill? No  Are you currently SOB?   When up and moving around  Have you traveled recently in a car or plane for an extended period of time?   No  Mother (Dot) stated she is concerned patient's legs have been swelling.

## 2023-10-21 NOTE — Telephone Encounter (Signed)
Called pt reviewed MD recommendation. Pt reports was taken off  Lasix previously d/t CKD.  I again advised dietary changes.  Pt reports will see kidney Dr tomorrow.  Advised pt to mention concerns of swelling  to see if nephrology has further suggestions.  Asked that pt call or send a my chart message after OV.  Will send concern to MD to re advise if nephrology does not address concerns.

## 2023-10-22 DIAGNOSIS — I480 Paroxysmal atrial fibrillation: Secondary | ICD-10-CM | POA: Diagnosis not present

## 2023-10-22 DIAGNOSIS — N184 Chronic kidney disease, stage 4 (severe): Secondary | ICD-10-CM | POA: Diagnosis not present

## 2023-10-22 DIAGNOSIS — I129 Hypertensive chronic kidney disease with stage 1 through stage 4 chronic kidney disease, or unspecified chronic kidney disease: Secondary | ICD-10-CM | POA: Diagnosis not present

## 2023-10-22 DIAGNOSIS — R7303 Prediabetes: Secondary | ICD-10-CM | POA: Diagnosis not present

## 2023-10-23 LAB — LAB REPORT - SCANNED: EGFR: 16

## 2023-10-26 ENCOUNTER — Telehealth: Payer: Self-pay | Admitting: Internal Medicine

## 2023-10-26 ENCOUNTER — Other Ambulatory Visit: Payer: Self-pay | Admitting: Internal Medicine

## 2023-10-26 ENCOUNTER — Other Ambulatory Visit (HOSPITAL_COMMUNITY): Payer: Self-pay

## 2023-10-26 DIAGNOSIS — M1A09X0 Idiopathic chronic gout, multiple sites, without tophus (tophi): Secondary | ICD-10-CM

## 2023-10-26 NOTE — Telephone Encounter (Signed)
Caller is Debbie, patient advocate. Caller states she was calling in regards to patient's zepbound. States pt was approved for lower dosage of zepbound but once increased it was denied. States this could be due to a coding issue. Caller states she believes this is the case because the insurance company covers the medication, especially with patient's diagnoses below. States that she has also been informed by pt that now insurance is now denying the lower dosage he was just on. Can this be fixed?   Caller can be reached at [phone].   Diagnoses:  -Stage 4 kidney failure which is managed by  Dr. Malen Gauze @ [facility]  -Morbid obesity  -Open heart surgery multiple times -Liver failure

## 2023-10-26 NOTE — Telephone Encounter (Signed)
Called and was unable to leave a vm, as it was full. Sent my chart message asking patient what dosage of Zepbound he needs.

## 2023-10-28 ENCOUNTER — Other Ambulatory Visit: Payer: Self-pay | Admitting: *Deleted

## 2023-10-28 MED ORDER — FAMOTIDINE 20 MG PO TABS: 20.0000 mg | ORAL_TABLET | Freq: Two times a day (BID) | ORAL | 0 refills | Status: DC

## 2023-11-02 ENCOUNTER — Other Ambulatory Visit: Payer: Self-pay

## 2023-11-02 ENCOUNTER — Emergency Department (HOSPITAL_COMMUNITY): Payer: BC Managed Care – PPO

## 2023-11-02 ENCOUNTER — Encounter: Payer: Self-pay | Admitting: Internal Medicine

## 2023-11-02 ENCOUNTER — Encounter (HOSPITAL_COMMUNITY): Payer: Self-pay

## 2023-11-02 ENCOUNTER — Other Ambulatory Visit (HOSPITAL_COMMUNITY): Payer: Self-pay

## 2023-11-02 ENCOUNTER — Ambulatory Visit (INDEPENDENT_AMBULATORY_CARE_PROVIDER_SITE_OTHER): Payer: BC Managed Care – PPO | Admitting: Internal Medicine

## 2023-11-02 ENCOUNTER — Inpatient Hospital Stay (HOSPITAL_COMMUNITY)
Admission: EM | Admit: 2023-11-02 | Discharge: 2023-11-04 | DRG: 683 | Disposition: A | Discharge status: Home / Self Care | Payer: BC Managed Care – PPO | Source: Ambulatory Visit | Attending: Internal Medicine | Admitting: Internal Medicine

## 2023-11-02 VITALS — BP 147/83 | HR 66 | Temp 98.2°F | Ht 71.0 in | Wt 391.8 lb

## 2023-11-02 DIAGNOSIS — Z8249 Family history of ischemic heart disease and other diseases of the circulatory system: Secondary | ICD-10-CM

## 2023-11-02 DIAGNOSIS — T383X5A Adverse effect of insulin and oral hypoglycemic [antidiabetic] drugs, initial encounter: Secondary | ICD-10-CM | POA: Diagnosis present

## 2023-11-02 DIAGNOSIS — M4316 Spondylolisthesis, lumbar region: Secondary | ICD-10-CM

## 2023-11-02 DIAGNOSIS — M1A09X0 Idiopathic chronic gout, multiple sites, without tophus (tophi): Secondary | ICD-10-CM

## 2023-11-02 DIAGNOSIS — Z5309 Procedure and treatment not carried out because of other contraindication: Secondary | ICD-10-CM

## 2023-11-02 DIAGNOSIS — G4733 Obstructive sleep apnea (adult) (pediatric): Secondary | ICD-10-CM

## 2023-11-02 DIAGNOSIS — E860 Dehydration: Secondary | ICD-10-CM | POA: Diagnosis not present

## 2023-11-02 DIAGNOSIS — Z79899 Other long term (current) drug therapy: Secondary | ICD-10-CM

## 2023-11-02 DIAGNOSIS — D699 Hemorrhagic condition, unspecified: Secondary | ICD-10-CM

## 2023-11-02 DIAGNOSIS — E1122 Type 2 diabetes mellitus with diabetic chronic kidney disease: Secondary | ICD-10-CM | POA: Diagnosis present

## 2023-11-02 DIAGNOSIS — I10 Essential (primary) hypertension: Secondary | ICD-10-CM | POA: Diagnosis not present

## 2023-11-02 DIAGNOSIS — Z7985 Long-term (current) use of injectable non-insulin antidiabetic drugs: Secondary | ICD-10-CM

## 2023-11-02 DIAGNOSIS — I872 Venous insufficiency (chronic) (peripheral): Secondary | ICD-10-CM

## 2023-11-02 DIAGNOSIS — I252 Old myocardial infarction: Secondary | ICD-10-CM

## 2023-11-02 DIAGNOSIS — B009 Herpesviral infection, unspecified: Secondary | ICD-10-CM

## 2023-11-02 DIAGNOSIS — I5032 Chronic diastolic (congestive) heart failure: Secondary | ICD-10-CM | POA: Diagnosis not present

## 2023-11-02 DIAGNOSIS — I129 Hypertensive chronic kidney disease with stage 1 through stage 4 chronic kidney disease, or unspecified chronic kidney disease: Secondary | ICD-10-CM | POA: Diagnosis not present

## 2023-11-02 DIAGNOSIS — E039 Hypothyroidism, unspecified: Secondary | ICD-10-CM

## 2023-11-02 DIAGNOSIS — D472 Monoclonal gammopathy: Secondary | ICD-10-CM | POA: Diagnosis present

## 2023-11-02 DIAGNOSIS — K219 Gastro-esophageal reflux disease without esophagitis: Secondary | ICD-10-CM | POA: Diagnosis present

## 2023-11-02 DIAGNOSIS — D62 Acute posthemorrhagic anemia: Secondary | ICD-10-CM | POA: Diagnosis not present

## 2023-11-02 DIAGNOSIS — K921 Melena: Secondary | ICD-10-CM | POA: Diagnosis present

## 2023-11-02 DIAGNOSIS — D696 Thrombocytopenia, unspecified: Secondary | ICD-10-CM

## 2023-11-02 DIAGNOSIS — K7210 Chronic hepatic failure without coma: Secondary | ICD-10-CM

## 2023-11-02 DIAGNOSIS — M109 Gout, unspecified: Secondary | ICD-10-CM | POA: Diagnosis present

## 2023-11-02 DIAGNOSIS — E1165 Type 2 diabetes mellitus with hyperglycemia: Secondary | ICD-10-CM | POA: Diagnosis present

## 2023-11-02 DIAGNOSIS — K7581 Nonalcoholic steatohepatitis (NASH): Secondary | ICD-10-CM | POA: Diagnosis present

## 2023-11-02 DIAGNOSIS — T501X5A Adverse effect of loop [high-ceiling] diuretics, initial encounter: Secondary | ICD-10-CM | POA: Diagnosis present

## 2023-11-02 DIAGNOSIS — Z7984 Long term (current) use of oral hypoglycemic drugs: Secondary | ICD-10-CM

## 2023-11-02 DIAGNOSIS — R9389 Abnormal findings on diagnostic imaging of other specified body structures: Secondary | ICD-10-CM | POA: Diagnosis not present

## 2023-11-02 DIAGNOSIS — K625 Hemorrhage of anus and rectum: Secondary | ICD-10-CM

## 2023-11-02 DIAGNOSIS — R161 Splenomegaly, not elsewhere classified: Secondary | ICD-10-CM

## 2023-11-02 DIAGNOSIS — R7303 Prediabetes: Secondary | ICD-10-CM

## 2023-11-02 DIAGNOSIS — I2510 Atherosclerotic heart disease of native coronary artery without angina pectoris: Secondary | ICD-10-CM

## 2023-11-02 DIAGNOSIS — R0602 Shortness of breath: Secondary | ICD-10-CM

## 2023-11-02 DIAGNOSIS — Z6841 Body Mass Index (BMI) 40.0 and over, adult: Secondary | ICD-10-CM | POA: Diagnosis not present

## 2023-11-02 DIAGNOSIS — R109 Unspecified abdominal pain: Secondary | ICD-10-CM | POA: Diagnosis not present

## 2023-11-02 DIAGNOSIS — E7800 Pure hypercholesterolemia, unspecified: Secondary | ICD-10-CM | POA: Diagnosis present

## 2023-11-02 DIAGNOSIS — D631 Anemia in chronic kidney disease: Secondary | ICD-10-CM | POA: Diagnosis not present

## 2023-11-02 DIAGNOSIS — K922 Gastrointestinal hemorrhage, unspecified: Secondary | ICD-10-CM

## 2023-11-02 DIAGNOSIS — E782 Mixed hyperlipidemia: Secondary | ICD-10-CM

## 2023-11-02 DIAGNOSIS — D693 Immune thrombocytopenic purpura: Secondary | ICD-10-CM | POA: Diagnosis present

## 2023-11-02 DIAGNOSIS — I130 Hypertensive heart and chronic kidney disease with heart failure and stage 1 through stage 4 chronic kidney disease, or unspecified chronic kidney disease: Secondary | ICD-10-CM | POA: Diagnosis present

## 2023-11-02 DIAGNOSIS — F32A Depression, unspecified: Secondary | ICD-10-CM | POA: Diagnosis not present

## 2023-11-02 DIAGNOSIS — Z833 Family history of diabetes mellitus: Secondary | ICD-10-CM

## 2023-11-02 DIAGNOSIS — J329 Chronic sinusitis, unspecified: Secondary | ICD-10-CM

## 2023-11-02 DIAGNOSIS — N179 Acute kidney failure, unspecified: Secondary | ICD-10-CM | POA: Diagnosis not present

## 2023-11-02 DIAGNOSIS — Z7901 Long term (current) use of anticoagulants: Secondary | ICD-10-CM

## 2023-11-02 DIAGNOSIS — Z8719 Personal history of other diseases of the digestive system: Secondary | ICD-10-CM

## 2023-11-02 DIAGNOSIS — R112 Nausea with vomiting, unspecified: Secondary | ICD-10-CM

## 2023-11-02 DIAGNOSIS — I1A0 Resistant hypertension: Secondary | ICD-10-CM

## 2023-11-02 DIAGNOSIS — I4892 Unspecified atrial flutter: Secondary | ICD-10-CM

## 2023-11-02 DIAGNOSIS — N184 Chronic kidney disease, stage 4 (severe): Secondary | ICD-10-CM

## 2023-11-02 DIAGNOSIS — Z880 Allergy status to penicillin: Secondary | ICD-10-CM

## 2023-11-02 DIAGNOSIS — I480 Paroxysmal atrial fibrillation: Secondary | ICD-10-CM | POA: Diagnosis present

## 2023-11-02 DIAGNOSIS — Z860100 Personal history of colon polyps, unspecified: Secondary | ICD-10-CM

## 2023-11-02 DIAGNOSIS — N183 Chronic kidney disease, stage 3 unspecified: Secondary | ICD-10-CM

## 2023-11-02 DIAGNOSIS — K429 Umbilical hernia without obstruction or gangrene: Secondary | ICD-10-CM | POA: Diagnosis not present

## 2023-11-02 DIAGNOSIS — Z881 Allergy status to other antibiotic agents status: Secondary | ICD-10-CM

## 2023-11-02 DIAGNOSIS — T465X5A Adverse effect of other antihypertensive drugs, initial encounter: Secondary | ICD-10-CM | POA: Diagnosis present

## 2023-11-02 DIAGNOSIS — R5383 Other fatigue: Secondary | ICD-10-CM

## 2023-11-02 DIAGNOSIS — I77819 Aortic ectasia, unspecified site: Secondary | ICD-10-CM

## 2023-11-02 DIAGNOSIS — Z951 Presence of aortocoronary bypass graft: Secondary | ICD-10-CM

## 2023-11-02 DIAGNOSIS — Z888 Allergy status to other drugs, medicaments and biological substances status: Secondary | ICD-10-CM

## 2023-11-02 DIAGNOSIS — R072 Precordial pain: Secondary | ICD-10-CM | POA: Diagnosis not present

## 2023-11-02 DIAGNOSIS — Z9049 Acquired absence of other specified parts of digestive tract: Secondary | ICD-10-CM

## 2023-11-02 DIAGNOSIS — R001 Bradycardia, unspecified: Secondary | ICD-10-CM

## 2023-11-02 LAB — TROPONIN I (HIGH SENSITIVITY): Troponin I (High Sensitivity): 3 ng/L (ref <18)

## 2023-11-02 LAB — CBC
HCT: 35.8 % — ABNORMAL LOW (ref 39.0–52.0)
Hemoglobin: 11.3 g/dL — ABNORMAL LOW (ref 13.0–17.0)
MCH: 32.4 pg (ref 26.0–34.0)
MCHC: 31.6 g/dL (ref 30.0–36.0)
MCV: 102.6 fL — ABNORMAL HIGH (ref 80.0–100.0)
Platelets: 54 10*3/uL — ABNORMAL LOW (ref 150–400)
RBC: 3.49 MIL/uL — ABNORMAL LOW (ref 4.22–5.81)
RDW: 14.6 % (ref 11.5–15.5)
WBC: 5.3 10*3/uL (ref 4.0–10.5)
nRBC: 0 % (ref 0.0–0.2)

## 2023-11-02 LAB — COMPREHENSIVE METABOLIC PANEL
ALT: 71 U/L — ABNORMAL HIGH (ref 0–44)
AST: 57 U/L — ABNORMAL HIGH (ref 15–41)
Albumin: 2.8 g/dL — ABNORMAL LOW (ref 3.5–5.0)
Alkaline Phosphatase: 188 U/L — ABNORMAL HIGH (ref 38–126)
Anion gap: 9 (ref 5–15)
BUN: 70 mg/dL — ABNORMAL HIGH (ref 6–20)
CO2: 25 mmol/L (ref 22–32)
Calcium: 9.1 mg/dL (ref 8.9–10.3)
Chloride: 106 mmol/L (ref 98–111)
Creatinine, Ser: 5.63 mg/dL — ABNORMAL HIGH (ref 0.61–1.24)
GFR, Estimated: 12 mL/min — ABNORMAL LOW (ref >60)
Glucose, Bld: 170 mg/dL — ABNORMAL HIGH (ref 70–99)
Potassium: 3.9 mmol/L (ref 3.5–5.1)
Sodium: 140 mmol/L (ref 135–145)
Total Bilirubin: 0.7 mg/dL (ref <1.2)
Total Protein: 7.1 g/dL (ref 6.5–8.1)

## 2023-11-02 LAB — TYPE AND SCREEN
ABO/RH(D): A POS
Antibody Screen: NEGATIVE

## 2023-11-02 LAB — BRAIN NATRIURETIC PEPTIDE: B Natriuretic Peptide: 25.4 pg/mL (ref 0.0–100.0)

## 2023-11-02 LAB — POC OCCULT BLOOD, ED: Fecal Occult Bld: NEGATIVE

## 2023-11-02 LAB — CBG MONITORING, ED: Glucose-Capillary: 123 mg/dL — ABNORMAL HIGH (ref 70–99)

## 2023-11-02 LAB — HEMOGLOBIN AND HEMATOCRIT, BLOOD
HCT: 35.9 % — ABNORMAL LOW (ref 39.0–52.0)
Hemoglobin: 11.6 g/dL — ABNORMAL LOW (ref 13.0–17.0)

## 2023-11-02 MED ORDER — VITAMIN B-12 1000 MCG PO TABS
1000.0000 ug | ORAL_TABLET | Freq: Every day | ORAL | Status: DC
  Administered 2023-11-03 – 2023-11-04 (×2): 1000 ug via ORAL
  Filled 2023-11-02 (×2): qty 1

## 2023-11-02 MED ORDER — CEFDINIR 300 MG PO CAPS
300.0000 mg | ORAL_CAPSULE | Freq: Two times a day (BID) | ORAL | 0 refills | Status: DC
  Filled 2023-11-02: qty 20, 10d supply, fill #0

## 2023-11-02 MED ORDER — SUCRALFATE 1 G PO TABS
1.0000 g | ORAL_TABLET | Freq: Every day | ORAL | Status: DC
  Administered 2023-11-02 – 2023-11-03 (×2): 1 g via ORAL
  Filled 2023-11-02 (×2): qty 1

## 2023-11-02 MED ORDER — MONTELUKAST SODIUM 10 MG PO TABS
10.0000 mg | ORAL_TABLET | Freq: Every day | ORAL | Status: DC
  Administered 2023-11-03 – 2023-11-04 (×2): 10 mg via ORAL
  Filled 2023-11-02 (×2): qty 1

## 2023-11-02 MED ORDER — MELATONIN 5 MG PO TABS: 5.0000 mg | ORAL_TABLET | Freq: Every evening | ORAL | Status: DC | PRN

## 2023-11-02 MED ORDER — TIRZEPATIDE 2.5 MG/0.5ML ~~LOC~~ SOAJ: 2.5000 mg | SUBCUTANEOUS | 3 refills | Status: DC

## 2023-11-02 MED ORDER — CEFDINIR 300 MG PO CAPS: 300.0000 mg | ORAL_CAPSULE | Freq: Two times a day (BID) | ORAL | 0 refills | Status: DC

## 2023-11-02 MED ORDER — INSULIN ASPART 100 UNIT/ML IJ SOLN
0.0000 [IU] | Freq: Three times a day (TID) | INTRAMUSCULAR | Status: DC
  Administered 2023-11-04: 2 [IU] via SUBCUTANEOUS

## 2023-11-02 MED ORDER — SODIUM CHLORIDE 0.9 % IV SOLN: INTRAVENOUS | Status: DC

## 2023-11-02 MED ORDER — HYDROMORPHONE HCL 1 MG/ML IJ SOLN: 0.5000 mg | INTRAMUSCULAR | Status: DC | PRN

## 2023-11-02 MED ORDER — ACETAMINOPHEN 325 MG PO TABS
650.0000 mg | ORAL_TABLET | Freq: Four times a day (QID) | ORAL | Status: DC | PRN
  Administered 2023-11-03 – 2023-11-04 (×3): 650 mg via ORAL
  Filled 2023-11-02 (×3): qty 2

## 2023-11-02 MED ORDER — PROCHLORPERAZINE EDISYLATE 10 MG/2ML IJ SOLN: 5.0000 mg | Freq: Four times a day (QID) | INTRAMUSCULAR | Status: DC | PRN

## 2023-11-02 MED ORDER — OXYCODONE HCL 5 MG PO TABS: 5.0000 mg | ORAL_TABLET | Freq: Four times a day (QID) | ORAL | Status: DC | PRN

## 2023-11-02 MED ORDER — INSULIN ASPART 100 UNIT/ML IJ SOLN: 0.0000 [IU] | Freq: Every day | INTRAMUSCULAR | Status: DC

## 2023-11-02 MED ORDER — FAMOTIDINE IN NACL 20-0.9 MG/50ML-% IV SOLN
20.0000 mg | Freq: Every day | INTRAVENOUS | Status: DC
  Administered 2023-11-02 – 2023-11-04 (×3): 20 mg via INTRAVENOUS
  Filled 2023-11-02 (×3): qty 50

## 2023-11-02 MED ORDER — OZEMPIC (0.25 OR 0.5 MG/DOSE) 2 MG/3ML ~~LOC~~ SOPN
0.2500 mg | PEN_INJECTOR | SUBCUTANEOUS | 11 refills | Status: DC
  Filled 2023-11-02: qty 3, 28d supply, fill #0

## 2023-11-02 MED ORDER — POLYETHYLENE GLYCOL 3350 17 G PO PACK: 17.0000 g | PACK | Freq: Every day | ORAL | Status: DC | PRN

## 2023-11-02 MED ORDER — PANTOPRAZOLE SODIUM 40 MG IV SOLR: 40.0000 mg | Freq: Two times a day (BID) | INTRAVENOUS | Status: DC

## 2023-11-02 MED ORDER — AMITRIPTYLINE HCL 25 MG PO TABS
25.0000 mg | ORAL_TABLET | Freq: Every day | ORAL | Status: DC
  Administered 2023-11-02 – 2023-11-03 (×2): 25 mg via ORAL
  Filled 2023-11-02 (×2): qty 1

## 2023-11-02 NOTE — Assessment & Plan Note (Signed)
Liver Disease   His liver disease complicates overall health and increases bleeding risk, potentially contributing to GI symptoms and overall poor health. We will monitor liver function tests during the ER visit and discuss the potential need for earlier fistula placement with a nephrologist if significant bleeding is confirmed.

## 2023-11-02 NOTE — ED Provider Triage Note (Signed)
Emergency Medicine Provider Triage Evaluation Note  Alie Italy Pondexter , a 52 y.o. male  was evaluated in triage.  Pt complains of bright red blood in stool this AM. Also complaining of SOB, midsternal CP, and fatigue for about 1-2 months, worse with walking or exertion. On eliquis  Review of Systems  Positive: As above, epigastric pain Negative: Rectal pain  Physical Exam  BP (!) 140/75   Pulse 69   Temp 97.6 F (36.4 C) (Oral)   Resp 18   Ht 5\' 11"  (1.803 m)   Wt (!) 177.7 kg   SpO2 100%   BMI 54.64 kg/m  Gen:   Awake, no distress   Resp:  Normal effort  MSK:   Moves extremities without difficulty  Other:    Medical Decision Making  Medically screening exam initiated at 1:39 PM.  Appropriate orders placed.  Johnathen Italy Casebolt was informed that the remainder of the evaluation will be completed by another provider, this initial triage assessment does not replace that evaluation, and the importance of remaining in the ED until their evaluation is complete.  Workup initiated   Jeanella Flattery 11/02/23 1341

## 2023-11-02 NOTE — ED Notes (Signed)
Report received from Loren Racer RN. Assumed care of pt at this time.

## 2023-11-02 NOTE — Telephone Encounter (Signed)
Unable to leave a message.

## 2023-11-02 NOTE — Progress Notes (Addendum)
 [facility]: [phone]   -- Medical Office Visit --  Patient:  Sean [name]      Age: 52 y.o.       Sex:  male  Date:   11/02/2023 Today's Healthcare Provider: Lula Olszewski, MD  ==========================================================================      Assessment & Plan Gastrointestinal hemorrhage, unspecified gastrointestinal hemorrhage type Gastrointestinal Bleeding   He presents with fresh blood in stool, significant heartburn, and vomiting, suggesting a serious GI bleed possibly from a gastric ulcer or gastric varices due to liver disease, which increases bleeding risk. Immediate hospital evaluation is recommended to manage the risk of significant blood loss and potential rapid deterioration. We will recommend an immediate ER visit for a lab workup and evaluation, order a rectal exam and labs to check for signs of GI bleeding, and admit to the hospital if necessary for overnight monitoring and further evaluation. Chronic kidney disease, stage 4 (severe) (HCC)  Coronary artery disease involving native coronary artery of native heart without angina pectoris  Bleeding diathesis (HCC)  Chronic venous stasis dermatitis of both lower extremities  Chronic liver failure without hepatic coma (HCC)  Ectatic aorta (HCC)  Gastroesophageal reflux disease without esophagitis  Herpes simplex  History of coronary artery bypass surgery  History of umbilical hernia repair  Resistant hypertension  Mixed hyperlipidemia  Hypothyroidism, unspecified type  Malaise and fatigue  MGUS (monoclonal gammopathy of unknown significance)  Morbid obesity (HCC)  NASH (nonalcoholic steatohepatitis) Liver Disease   His liver disease complicates overall health and increases bleeding risk, potentially contributing to GI symptoms and overall poor health. We will monitor liver function tests during the ER visit and discuss the potential need for  earlier fistula placement with a nephrologist if significant bleeding is confirmed. Nausea and vomiting, unspecified vomiting type  Obstructive sleep apnea syndrome  Other long term (current) drug therapy  Splenomegaly  Sinus bradycardia  Prediabetes  Atrial flutter with rapid ventricular response (HCC)  Thrombocytopenia (HCC)  Statins contraindicated  Spondylolisthesis of lumbar region  Chronic gout of multiple sites, unspecified cause  Recurrent sinus infections Sinus Infection   He reports sinus pressure, headaches, stuffy nose, and minor trauma-related inflammation in nasal passages, suggesting a possible refractory sinus infection, potentially complicated by CPAP use. We will order a CT scan of the sinuses, refer to an ENT for evaluation of the refractory sinus condition, prescribe a different antibiotic for the sinus infection, and hold off on steroids due to the risk of exacerbating a potential ulcer. Coronary arteriosclerosis in patient with history of previous myocardial infarction History coronary artery bypass graft(s) and LAD blockage. Will use to try to get [facility] covered, previously refused Stage 3 chronic kidney disease, unspecified whether stage 3a or 3b CKD (HCC)  Shortness of breath Shortness of Breath   He has a recent onset of shortness of breath, potentially related to blood loss, fluid overload, or underlying cardiac issues, with a history of heart disease and recent fluid retention managed with Lasix. We will order a chest X-ray as an outpatient if the ER visit is declined, and monitor for signs of fluid overload, adjusting Lasix as needed.     Orders Placed During this Encounter:            Ordered    Ambulatory referral to ENT       Comments: CPAP associated recurring sinus infection not resolving with antibiotic(s) steroids Occurring with epistaxis Evaluate for fungal sinusitis.   11/02/23 1148  tirzepatide [facility]) 2.5 MG/0.5ML Pen  Weekly         11/02/23 1148    CT SINUS WO CONTRAST        11/02/23 1152    cefdinir (OMNICEF) 300 MG capsule  2 times daily,   Status:  Discontinued        11/02/23 1152    Semaglutide,0.25 or 0.5MG /DOS, (OZEMPIC, 0.25 OR 0.5 MG/DOSE,) 2 MG/3ML SOPN  Weekly        11/02/23 1205    cefdinir (OMNICEF) 300 MG capsule  2 times daily        11/02/23 1205          General Health Maintenance   His overall health is compromised by multiple chronic conditions, including liver disease, heart disease, and kidney issues, necessitating ongoing management and monitoring. We will advocate for insurance approval of necessary medications, consider alternative funding options for medications if insurance denies coverage, and encourage follow-up with a nephrologist regarding kidney function and potential fistula placement.  Follow-up   He should follow up with his primary care physician after the ER visit, ensure appointments with a nephrologist and cardiologist as needed, and monitor for any new or worsening symptoms, seeking immediate care if necessary.  Recommended follow-up: 1 week emergency room follow up  Future Appointments  Date Time Provider Department Center  11/09/2023 11:20 AM Lula Olszewski, MD [facility]  01/01/2024  4:00 PM Christell Constant, MD [facility]  01/18/2024  8:30 AM [facility] None  01/29/2024  4:30 PM Weston Settle, MD [facility] None  Patient Care Team: Lula Olszewski, MD as PCP - General (Internal Medicine) Christell Constant, MD as PCP - Cardiology (Cardiology) Weston Settle, MD as Consulting Physician (Oncology) Alejandro Mulling, RN as [facility] Care Management Tressia Danas, MD (Inactive) as Consulting Physician (Gastroenterology) Estanislado Emms, MD as Consulting Physician (Nephrology)    SUBJECTIVE: 52 y.o. male who has History of umbilical hernia repair; Hypertension; Hyperlipidemia; Thrombocytopenia (HCC);  Sinus bradycardia; CAD (coronary artery disease); Atrial flutter with rapid ventricular response (HCC); Morbid obesity (HCC); Chronic kidney disease, stage 4 (severe) (HCC); Other long term (current) drug therapy; History of coronary artery bypass surgery; Obstructive sleep apnea syndrome; Chronic gout of multiple sites; Alkaline phosphatase elevation; Ectatic aorta (HCC); Gastroesophageal reflux disease without esophagitis; Herpes simplex; NASH (nonalcoholic steatohepatitis); Prediabetes; Statins contraindicated; Hypothyroid; Chronic venous stasis dermatitis of both lower extremities; Nausea and vomiting; Chronic liver failure without hepatic coma (HCC); Splenomegaly; Spondylolisthesis; Bleeding diathesis (HCC); Malaise and fatigue; Allergic rhinitis due to pollen; Amitriptyline adverse reaction; MGUS (monoclonal gammopathy of unknown significance); 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  Main reasons for visit/main concerns/chief complaint: 6 month follow-up, Emesis (This morning), Headache, and Facial Pain    AI-Extracted: Discussed the use of AI scribe software for clinical note transcription with the patient, who gave verbal consent to proceed.  History of Present Illness   The patient, with a history of coronary bypass surgery, liver disease, and kidney disease, presented with a chief complaint of sinus issues, shortness of breath, and gastrointestinal symptoms. The patient reported a persistent sinus problem, which was previously treated with a steroid taper and an antibiotic, but symptoms persisted. The patient also reported a sensation of pressure in the sinus area and increased sleepiness, which he attributed to his sinus issues.  The patient also reported shortness of breath, which was particularly noticeable after eating,  regardless of the size of the meal. The patient described feeling fatigued and experiencing shortness of  breath quickly.  In addition to these symptoms, the patient reported gastrointestinal issues, including indigestion and the presence of fresh blood in the stool. The patient also reported a history of heartburn. The patient's spouse reported that the patient had been vomiting, which had occurred multiple times over the past few days.  The patient also reported leg pain and swelling, particularly in the right leg. The patient had been seen by a nephrologist for this issue and had been prescribed Lasix. The patient reported a significant amount of fluid retention and swelling in the legs.  The patient also reported a history of open heart surgery, which was performed due to a blockage in a major artery. The patient reported that he had been told that if he had experienced a full heart attack, there would have been nothing that could have been done for him.  The patient's spouse reported that the patient had lost weight after being prescribed a diuretic by his nephrologist. The patient also reported a history of weight loss, having lost 100 pounds several years ago and having been able to maintain that weight loss.  The patient reported a history of prediabetes, but his blood sugar levels were consistently good. The patient also reported a history of liver disease and kidney disease. The patient's spouse reported that the patient had been recommended to have a fistula placed for potential future dialysis, but the patient was hesitant about this.  The patient reported a history of using a CPAP machine for sleep apnea. The patient also reported a history of skin issues, including rosacea and dry skin, which he attributed to the use of the CPAP machine.  The patient reported a history of using a GLP-1 receptor agonist for weight loss, but his insurance had stopped covering this medication. The patient expressed a desire to continue this medication if possible.  In summary, the patient presented with  persistent sinus issues, shortness of breath, gastrointestinal symptoms including indigestion and the presence of blood in the stool, leg pain and swelling, and a history of coronary bypass surgery, liver disease, kidney disease, and prediabetes. The patient's symptoms have persisted despite previous treatments, and the patient expressed concern about his ongoing health issues.       Note that patient  has a past medical history of Abnormal liver enzymes, Cardiac cirrhosis, Chronic combined systolic and diastolic CHF (congestive heart failure) (HCC), CKD (chronic kidney disease) stage 3, GFR 30-59 ml/min (HCC) (01/04/2016), CKD (chronic kidney disease), stage II, Congenital heart defect, Coronary artery disease, Gastric polyps (11/25/2022), Gastritis and gastroduodenitis (11/25/2022), Gastroesophageal reflux disease with esophagitis and hemorrhage (04/04/2021), Hematuria, History of umbilical hernia repair (07/10/2011), Hypercholesteremia, Hypertension, Incisional hernia (07/10/2011), Morbid obesity (HCC), Morbid obesity with BMI of 50.0-59.9, adult (HCC) (01/04/2016), Nausea and vomiting (11/25/2022), OSA on CPAP, Paroxysmal atrial flutter (HCC) (02/12/2016), 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  Problem list overviews that were updated at today's visit:No problems updated.  Med reconciliation: Current Facility-Administered Medications on File Prior to Visit  Medication   technetium tetrofosmin (TC-MYOVIEW) injection 29.8 millicurie   Current Outpatient Medications on File Prior to Visit  Medication Sig   acetaminophen (TYLENOL) 500 MG tablet Take 500-1,500 mg by mouth 2 (two) times daily as needed for moderate pain, fever, headache or mild pain.   allopurinol (ZYLOPRIM) 100 MG tablet TAKE 1 TABLET(100 MG) BY MOUTH DAILY   amitriptyline (  ELAVIL) 25 MG tablet Take 1 tablet (25 mg total) by mouth at bedtime. (Patient taking differently: Take 25 mg by mouth  at bedtime.)   apixaban (ELIQUIS) 5 MG TABS tablet Take 1 tablet (5 mg total) by mouth 2 (two) times daily.   Cholecalciferol (VITAMIN D3) 125 MCG (5000 UT) CAPS Take 1 capsule (5,000 Units total) by mouth daily at 12 noon.   Cyanocobalamin (VITAMIN B-12 PO) Place 1 tablet under the tongue daily.   diltiazem (CARDIZEM SR) 120 MG 12 hr capsule Take 1 capsule (120 mg total) by mouth 2 (two) times daily.   Evolocumab (REPATHA SURECLICK) 140 MG/ML SOAJ INJECT THE CONTENTS OF 1 SYRINGE UNDER THE SKIN EVERY 14 DAYS   famotidine (PEPCID) 20 MG tablet Take 1 tablet (20 mg total) by mouth 2 (two) times daily.   furosemide (LASIX) 40 MG tablet Take by mouth.   JARDIANCE 10 MG TABS tablet TAKE 1 TABLET(10 MG) BY MOUTH DAILY BEFORE BREAKFAST   losartan (COZAAR) 25 MG tablet Take 1 tablet (25 mg total) by mouth daily.   montelukast (SINGULAIR) 10 MG tablet TAKE 1 TABLET(10 MG) BY MOUTH DAILY   nitroGLYCERIN (NITROSTAT) 0.4 MG SL tablet Place 1 tablet (0.4 mg total) under the tongue every 5 (five) minutes as needed for chest pain.   pantoprazole (PROTONIX) 40 MG tablet TAKE 1 TABLET(40 MG) BY MOUTH TWICE DAILY   sucralfate (CARAFATE) 1 g tablet TAKE 1 TABLET(1 GRAM) BY MOUTH TWICE DAILY. MAY INCREASE TO 4 TIMES DAILY FOR ANY ACUTE SYMPTOMS (Patient taking differently: Take 1 tablet by mouth at bedtime.)   triamcinolone (NASACORT) 55 MCG/ACT AERO nasal inhaler Place 1 spray into the nose daily. Start with 1 spray each side twice a day for 3 days, then reduce to daily.   Medications Discontinued During This Encounter  Medication Reason   cefdinir (OMNICEF) 300 MG capsule      Objective   Physical Exam     11/02/2023    7:56 PM 11/02/2023    4:45 PM 11/02/2023    4:37 PM  Vitals with BMI  Systolic 117 137 956  Diastolic 53 71 78  Pulse 67 66 71   Wt Readings from Last 10 Encounters:  11/02/23 (!) 391 lb 12.1 oz (177.7 kg)  11/02/23 (!) 391 lb 12.8 oz (177.7 kg)  10/08/23 (!) 384 lb (174.2 kg)   07/27/23 (!) 382 lb 14.4 oz (173.7 kg)  07/02/23 (!) 370 lb (167.8 kg)  06/17/23 (!) 377 lb 3.3 oz (171.1 kg)  05/04/23 (!) 374 lb (169.6 kg)  04/30/23 (!) 371 lb 4.8 oz (168.4 kg)  04/16/23 (!) 360 lb (163.3 kg)  04/15/23 (!) 366 lb 6.4 oz (166.2 kg)   Vital signs reviewed.  Nursing notes reviewed. Weight trend reviewed. Abnormalities and Problem-Specific physical exam findings:  looks ill  General Appearance:  No acute distress appreciable.   Well-groomed, ill-appearing male.  Well proportioned with no abnormal fat distribution.  Good muscle tone. Pulmonary:  Normal work of breathing at rest, no respiratory distress apparent. SpO2: 96 %  Musculoskeletal: All extremities are intact.  Neurological:  Awake, alert, oriented, and engaged.  No obvious focal neurological deficits or cognitive impairments.  Sensorium seems unclouded.   Speech is clear and coherent with logical content. Psychiatric:  Appropriate mood, pleasant and cooperative demeanor, thoughtful and engaged during the exam  Results   Procedure: Nasal Endoscopy Description: The endoscope was inserted through the nasal passage. The left turbinate showed minor trauma with mucosal  breakdown and shallow abrasions. No significant bleeding observed.        Results for orders placed or performed during the hospital encounter of 11/02/23  Comprehensive metabolic panel  Result Value Ref Range   Sodium 140 135 - 145 mmol/L   Potassium 3.9 3.5 - 5.1 mmol/L   Chloride 106 98 - 111 mmol/L   CO2 25 22 - 32 mmol/L   Glucose, Bld 170 (H) 70 - 99 mg/dL   BUN 70 (H) 6 - 20 mg/dL   Creatinine, Ser 1.61 (H) 0.61 - 1.24 mg/dL   Calcium 9.1 8.9 - 09.6 mg/dL   Total Protein 7.1 6.5 - 8.1 g/dL   Albumin 2.8 (L) 3.5 - 5.0 g/dL   AST 57 (H) 15 - 41 U/L   ALT 71 (H) 0 - 44 U/L   Alkaline Phosphatase 188 (H) 38 - 126 U/L   Total Bilirubin 0.7 <1.2 mg/dL   GFR, Estimated 12 (L) >60 mL/min   Anion gap 9 5 - 15  CBC  Result Value Ref Range    WBC 5.3 4.0 - 10.5 K/uL   RBC 3.49 (L) 4.22 - 5.81 MIL/uL   Hemoglobin 11.3 (L) 13.0 - 17.0 g/dL   HCT 04.5 (L) 40.9 - 81.1 %   MCV 102.6 (H) 80.0 - 100.0 fL   MCH 32.4 26.0 - 34.0 pg   MCHC 31.6 30.0 - 36.0 g/dL   RDW 91.4 78.2 - 95.6 %   Platelets 54 (L) 150 - 400 K/uL   nRBC 0.0 0.0 - 0.2 %  Brain natriuretic peptide  Result Value Ref Range   B Natriuretic Peptide 25.4 0.0 - 100.0 pg/mL  POC occult blood, ED  Result Value Ref Range   Fecal Occult Bld NEGATIVE NEGATIVE  Type and screen [facility]  Result Value Ref Range   ABO/RH(D) A POS    Antibody Screen NEG    Sample Expiration      11/05/2023,2359 Performed at [facility], [address] [place], [place] [address]   Troponin I (High Sensitivity)  Result Value Ref Range   Troponin I (High Sensitivity) 3 <18 ng/L  Results for orders placed or performed in visit on 11/02/23  Lab report - scanned  Result Value Ref Range   EGFR 16.0     Admission on 11/02/2023  Component Date Value   Sodium 11/02/2023 140    Potassium 11/02/2023 3.9    Chloride 11/02/2023 106    CO2 11/02/2023 25    Glucose, Bld 11/02/2023 170 (H)    BUN 11/02/2023 70 (H)    Creatinine, Ser 11/02/2023 5.63 (H)    Calcium 11/02/2023 9.1    Total Protein 11/02/2023 7.1    Albumin 11/02/2023 2.8 (L)    AST 11/02/2023 57 (H)    ALT 11/02/2023 71 (H)    Alkaline Phosphatase 11/02/2023 188 (H)    Total Bilirubin 11/02/2023 0.7    GFR, Estimated 11/02/2023 12 (L)    Anion gap 11/02/2023 9    WBC 11/02/2023 5.3    RBC 11/02/2023 3.49 (L)    Hemoglobin 11/02/2023 11.3 (L)    HCT 11/02/2023 35.8 (L)    MCV 11/02/2023 102.6 (H)    MCH 11/02/2023 32.4    MCHC 11/02/2023 31.6    RDW 11/02/2023 14.6    Platelets 11/02/2023 54 (L)    nRBC 11/02/2023 0.0    ABO/RH(D) 11/02/2023 A POS    Antibody Screen 11/02/2023 NEG    Sample  Expiration 11/02/2023                     Value:11/05/2023,2359 Performed at [facility]  Lab, [address] [place], [place] [address]    Fecal Occult Bld 11/02/2023 NEGATIVE    Troponin I (High Sensiti* 11/02/2023 3    B Natriuretic Peptide 11/02/2023 25.4   Scanned Document on 11/02/2023  Component Date Value   EGFR 10/23/2023 16.0   Abstract on 07/27/2023  Component Date Value   Hemoglobin 07/27/2023 11.6 (A)    HCT 07/27/2023 34 (A)    Neutrophils Absolute 07/27/2023 2.41    Platelets 07/27/2023 53 (A)    WBC 07/27/2023 4.3    RBC 07/27/2023 3.44 (A)    Glucose 07/27/2023 101    BUN 07/27/2023 45 (A)    CO2 07/27/2023 25 (A)    Creatinine 07/27/2023 3.2 (A)    Potassium 07/27/2023 4.4    Sodium 07/27/2023 140    Chloride 07/27/2023 109 (A)    Calcium 07/27/2023 9.2    Albumin 07/27/2023 3.7    Alkaline Phosphatase 07/27/2023 192 (A)    ALT 07/27/2023 50 (A)    AST 07/27/2023 48 (A)    Bilirubin, Total 07/27/2023 1.4   Appointment on 07/27/2023  Component Date Value   Kappa free light chain 07/27/2023 626.3 (H)    Lambda free light chains 07/27/2023 25.3    Kappa, lambda light chai* 07/27/2023 24.75 (H)    IgG (Immunoglobin G), Se* 07/27/2023 673    IgA 07/27/2023 1,160 (H)    IgM (Immunoglobulin M), * 07/27/2023 90    Total Protein ELP 07/27/2023 6.0    Albumin SerPl Elph-Mcnc 07/27/2023 3.0    Alpha 1 07/27/2023 0.2    Alpha2 Glob SerPl Elph-M* 07/27/2023 0.6    B-Globulin SerPl Elph-Mc* 07/27/2023 1.7 (H)    Gamma Glob SerPl Elph-Mc* 07/27/2023 0.5    M Protein SerPl Elph-Mcnc 07/27/2023 0.8 (H)    Globulin, Total 07/27/2023 3.0    Albumin/Glob SerPl 07/27/2023 1.1    IFE 1 07/27/2023 Comment (A)    Please Note 07/27/2023 Comment    Total Protein ELP 07/27/2023 6.4    Albumin ELP 07/27/2023 3.2    Alpha-1-Globulin 07/27/2023 0.3    Alpha-2-Globulin 07/27/2023 0.6    Beta Globulin 07/27/2023 1.7 (H)    Gamma Globulin 07/27/2023 0.7    M-Spike, % 07/27/2023 1.0 (H)    SPE Interp. 07/27/2023 Comment    Comment 07/27/2023 Comment    Globulin, Total  07/27/2023 3.2    A/G Ratio 07/27/2023 1.0   Office Visit on 07/02/2023  Component Date Value   VITD 07/02/2023 19.91 (L)    Free T4 07/02/2023 0.80    TSH 07/02/2023 10.55 (H)   Hospital Outpatient Visit on 06/26/2023  Component Date Value   WBC 06/26/2023 5.3    RBC 06/26/2023 3.34 (L)    Hemoglobin 06/26/2023 10.9 (L)    HCT 06/26/2023 33.3 (L)    MCV 06/26/2023 99.7    MCH 06/26/2023 32.6    MCHC 06/26/2023 32.7    RDW 06/26/2023 14.1    Platelets 06/26/2023 63 (L)    nRBC 06/26/2023 0.0    Prothrombin Time 06/26/2023 14.5    INR 06/26/2023 1.1    Sodium 06/26/2023 139    Potassium 06/26/2023 3.8    Chloride 06/26/2023 109    CO2 06/26/2023 20 (L)    Glucose, Bld 06/26/2023 114 (H)    BUN 06/26/2023 44 (H)  Creatinine, Ser 06/26/2023 3.81 (H)    Calcium 06/26/2023 8.5 (L)    GFR, Estimated 06/26/2023 18 (L)    Anion gap 06/26/2023 10    SURGICAL PATHOLOGY 06/26/2023                     Value:SURGICAL PATHOLOGY CASE: [id number] PATIENT: Julio Sicks Surgical Pathology Report     Clinical History: Medical liver (nt)     FINAL MICROSCOPIC DIAGNOSIS:  A. LIVER, BIOPSY: -  Heavily fragmented core of liver with essentially unremarkable hepatic parenchyma and portal tracts with mild portal fibrosis, mild chronic inflammation and questionable bile ductular proliferation. Note: It is noted that the patient has a complex medical history including acute on chronic kidney injury, chronic thrombocytopenia, a plasma cell neoplasm, diabetes, coronary artery disease with heart failure and reduced ejection fraction as well as morbid obesity.  The hepatic parenchyma itself is overall unremarkable without the presence of steatosis, ballooning or apoptotic bodies.  It should be noted there is quite a bit of fragmentation which can be a sign of a greater degree of fibrosis/cirrhosis then can be identified on the needle core biopsy given a diminished number of p                          ortal triads.  The portal triads available for evaluation do show a mild degree of portal fibrosis but without definitive evidence of periportal or bridging type fibrosis.  There is the presence of questionable bile ductular reaction which again raises the possibility of fibrosis that is not as readily identifiable on the HE/trichrome stains.  There is no central vein or perisinusoidal/pericellular fibrosis.  An iron stain shows no stainable iron a PAS and reticulin stain are also reviewed in the workup of the case. In summary, there is no ongoing hepatocellular damage identified including the presence of steatosis.  There are indications of an increased level of fibrosis given the fragmentation and bile ductular proliferation; however, the overall stage is difficult to evaluate on the current biopsy but is suggested to be likely stage III of 4.  If clinically indicated a repeat biopsy could be considered.    GROSS DESCRIPTION:  A. Received in formalin labeled with                          the patients name and "Liver bx" is a 1.1 x 0.8 x 0.1 cm aggregate of tan soft tissue fragments, submitted in toto in a single cassette.  (LEF 06/26/2023)   Final Diagnosis performed by Orene Desanctis DO.   Electronically signed 06/30/2023 Technical component performed at [employer]. [facility], [address], [place], [place] [address].  Professional component performed at [facility], [address] [place], [place] [address].  Immunohistochemistry Technical component (if applicable) was performed at [facility]. [address], [place], [place] [address].   IMMUNOHISTOCHEMISTRY DISCLAIMER (if applicable): Some of these immunohistochemical stains may have been developed and the performance characteristics determine by [facility]. Some may not have been cleared or approved by the U.S. Food and  Drug Administration. The FDA has determined that such clearance or approval is not necessary. This test                          is used for clinical purposes. It should not be  regarded as investigational or for research. This laboratory is certified under the Clinical Laboratory Improvement Amendments of 1988 (CLIA-88) as qualified to perform high complexity clinical laboratory testing.  The controls stained appropriately.   IHC stains are performed on formalin fixed, paraffin embedded tissue using a 3,3"diaminobenzidine (DAB) chromogen and Leica Bond Autostainer System. The staining intensity of the nucleus is score manually and is reported as the percentage of tumor cell nuclei demonstrating specific nuclear staining. The specimens are fixed in 10% Neutral Formalin for at least 6 hours and up to 72hrs. These tests are validated on decalcified tissue. Results should be interpreted with caution given the possibility of false negative results on decalcified specimens. Antibody Clones are as follows ER-clone 56F, PR-clone 16, Ki67- clone MM1. Some of these immunohistochemical stains may have been dev                         eloped and the performance characteristics determined by [facility].   Admission on 06/17/2023, Discharged on 06/19/2023  Component Date Value   WBC 06/17/2023 5.0    RBC 06/17/2023 3.56 (L)    Hemoglobin 06/17/2023 12.0 (L)    HCT 06/17/2023 36.3 (L)    MCV 06/17/2023 102.0 (H)    MCH 06/17/2023 33.7    MCHC 06/17/2023 33.1    RDW 06/17/2023 14.2    Platelets 06/17/2023 63 (L)    nRBC 06/17/2023 0.0    ABO/RH(D) 06/17/2023 A POS    Antibody Screen 06/17/2023 NEG    Sample Expiration 06/17/2023                     Value:06/20/2023,2359 Performed at [facility], [address], [place], [place] [address]    Sodium 06/17/2023 142    Potassium 06/17/2023 4.1    Chloride 06/17/2023 110    CO2 06/17/2023 24    Glucose, Bld 06/17/2023 120 (H)     BUN 06/17/2023 36 (H)    Creatinine, Ser 06/17/2023 3.35 (H)    Calcium 06/17/2023 9.0    GFR, Estimated 06/17/2023 21 (L)    Anion gap 06/17/2023 8    HIV Screen 4th Generatio* 06/17/2023 Non Reactive    Hgb A1c MFr Bld 06/17/2023 5.6    Mean Plasma Glucose 06/17/2023 114    Glucose-Capillary 06/17/2023 129 (H)    WBC 06/17/2023 5.3    RBC 06/17/2023 3.43 (L)    Hemoglobin 06/17/2023 11.6 (L)    HCT 06/17/2023 34.8 (L)    MCV 06/17/2023 101.5 (H)    MCH 06/17/2023 33.8    MCHC 06/17/2023 33.3    RDW 06/17/2023 14.3    Platelets 06/17/2023 53 (L)    nRBC 06/17/2023 0.0    Prothrombin Time 06/17/2023 14.5    INR 06/17/2023 1.1    Glucose-Capillary 06/17/2023 106 (H)    WBC 06/18/2023 4.7    RBC 06/18/2023 3.53 (L)    Hemoglobin 06/18/2023 11.5 (L)    HCT 06/18/2023 35.7 (L)    MCV 06/18/2023 101.1 (H)    MCH 06/18/2023 32.6    MCHC 06/18/2023 32.2    RDW 06/18/2023 14.3    Platelets 06/18/2023 55 (L)    nRBC 06/18/2023 0.0    Neutrophils Relative % 06/18/2023 53    Neutro Abs 06/18/2023 2.5    Lymphocytes Relative 06/18/2023 25    Lymphs Abs 06/18/2023 1.2    Monocytes Relative 06/18/2023 11    Monocytes Absolute 06/18/2023 0.5  Eosinophils Relative 06/18/2023 10    Eosinophils Absolute 06/18/2023 0.4    Basophils Relative 06/18/2023 1    Basophils Absolute 06/18/2023 0.0    Immature Granulocytes 06/18/2023 0    Abs Immature Granulocytes 06/18/2023 0.02    Glucose-Capillary 06/18/2023 85    Glucose-Capillary 06/18/2023 133 (H)    Sodium 06/19/2023 139    Potassium 06/19/2023 4.2    Chloride 06/19/2023 110    CO2 06/19/2023 23    Glucose, Bld 06/19/2023 131 (H)    BUN 06/19/2023 42 (H)    Creatinine, Ser 06/19/2023 3.76 (H)    Calcium 06/19/2023 8.6 (L)    GFR, Estimated 06/19/2023 19 (L)    Anion gap 06/19/2023 6    WBC 06/19/2023 5.0    RBC 06/19/2023 3.28 (L)    Hemoglobin 06/19/2023 10.9 (L)    HCT 06/19/2023 34.1 (L)    MCV 06/19/2023 104.0 (H)     MCH 06/19/2023 33.2    MCHC 06/19/2023 32.0    RDW 06/19/2023 14.4    Platelets 06/19/2023 54 (L)    nRBC 06/19/2023 0.0    Magnesium 06/19/2023 2.1    Glucose-Capillary 06/18/2023 143 (H)    Glucose-Capillary 06/19/2023 119 (H)   Hospital Outpatient Visit on 06/18/2023  Component Date Value   SURGICAL PATHOLOGY 06/18/2023                     Value:SURGICAL PATHOLOGY CASE: [id number] PATIENT: Danyell Nidiffer Surgical Pathology Report     Clinical History: CKD stage 4, proteinuria (cm)     FINAL MICROSCOPIC DIAGNOSIS:  A. KIDNEY, RIGHT LOWER POLE, NEEDLE CORE BIOPSY: - See OUTSIDE PATHOLOGY REPORT under RESULTS REVIEW or the LABS TAB under "SURGICAL PATHOLOGY" in [facility].   GROSS DESCRIPTION:  Received fresh are 2 cores of pink-tan soft tissue, each measuring 1.4 cm in length by 0.1 cm in diameter.  The specimen is entirely sent out for [employer] processing. (KW, 06/18/2023)   Final Diagnosis performed by Jimmy Picket, MD.   Electronically signed 06/24/2023 Technical and / or Professional components performed at [facility]. [facility], [address], [place], [place] [address].  Immunohistochemistry Technical component (if applicable) was performed at [facility]. [address], [place], [place] [address].   IMMUNOHISTOCHEMISTRY DISCLAIMER (if applicable): Some of th                         ese immunohistochemical stains may have been developed and the performance characteristics determine by [facility]. Some may not have been cleared or approved by the U.S. Food and Drug Administration. The FDA has determined that such clearance or approval is not necessary. This test is used for clinical purposes. It should not be regarded as investigational or for research. This laboratory is certified under the Clinical Laboratory Improvement Amendments of 1988 (CLIA-88) as qualified to perform high complexity clinical  laboratory testing.  The controls stained appropriately.   IHC stains are performed on formalin fixed, paraffin embedded tissue using a 3,3"diaminobenzidine (DAB) chromogen and Leica Bond Autostainer System. The staining intensity of the nucleus is score manually and is reported as the percentage of tumor cell nuclei demonstrating specific nuclear staining. The specimens are fixed in 10% Neutral Formalin for at least 6 hours and up to 72hrs.                          These tests  are validated on decalcified tissue. Results should be interpreted with caution given the possibility of false negative results on decalcified specimens. Antibody Clones are as follows ER-clone 13F, PR-clone 16, Ki67- clone MM1. Some of these immunohistochemical stains may have been developed and the performance characteristics determined by [facility].   Office Visit on 05/04/2023  Component Date Value   Cholesterol 05/04/2023 173    Triglycerides 05/04/2023 132.0    HDL 05/04/2023 53.70    VLDL 05/04/2023 26.4    LDL Cholesterol 05/04/2023 93    Total CHOL/HDL Ratio 05/04/2023 3    NonHDL 05/04/2023 118.98    TSH 05/04/2023 5.660 (H)    GGT 05/04/2023 299 (H)    AST 05/04/2023 36    ALT 05/04/2023 39    Platelets 05/04/2023 55 (LL)    Hematology Comments: 05/04/2023 Note:    FIB-4 Index 05/04/2023 5.24 (H)    T4,Free (Direct) 05/04/2023 0.93    Fibrosis Score 05/04/2023 0.24 (H)    Fibrosis Stage 05/04/2023 F0-F1    Steatosis Score 05/04/2023 0.76 (H)    Steatosis Grade 05/04/2023 Comment    NASH Score 05/04/2023 0.55 (H)    NASH Grade 05/04/2023 Comment    Methodology: 05/04/2023 Comment    ALPHA 2-MACROGLOBULINS, * 05/04/2023 132    Haptoglobin 05/04/2023 96    Apolipoprotein A-1 05/04/2023 144    Bilirubin, Total 05/04/2023 0.3    GGT 05/04/2023 334 (H)    ALT (SGPT) P5P 05/04/2023 45    AST (SGOT) P5P 05/04/2023 34    Cholesterol, Total 05/04/2023 187    Glucose 05/04/2023 93     Triglycerides 05/04/2023 136    Interpretations: 05/04/2023 Comment    Fibrosis Scoring: 05/04/2023 Comment    Steatosis Scoring 05/04/2023 Comment    NASH Scoring 05/04/2023 Comment    Limitations: 05/04/2023 Comment    Comment: 05/04/2023 Comment   Scanned Document on 04/22/2023  Component Date Value   Creatinine, POC 04/19/2023 57.7   There may be more visits with results that are not included.   No image results found.    Done later same day in emergency room   CT ABDOMEN PELVIS WO CONTRAST  Result Date: 11/02/2023 CLINICAL DATA:  Acute nonlocalized abdominal pain. Right red blood in stool this morning. EXAM: CT ABDOMEN AND PELVIS WITHOUT CONTRAST TECHNIQUE: Multidetector CT imaging of the abdomen and pelvis was performed following the standard protocol without IV contrast. RADIATION DOSE REDUCTION: This exam was performed according to the departmental dose-optimization program which includes automated exposure control, adjustment of the mA and/or kV according to patient size and/or use of iterative reconstruction technique. COMPARISON:  01/26/2023 FINDINGS: Lower chest: Lung bases are clear. Postoperative changes in the mediastinum. Elevation of the left hemidiaphragm is unchanged. Hepatobiliary: No focal liver abnormality is seen. Status post cholecystectomy. No biliary dilatation. Pancreas: Unremarkable. No pancreatic ductal dilatation or surrounding inflammatory changes. Spleen: Normal in size without focal abnormality. Adrenals/Urinary Tract: Adrenal glands are unremarkable. Kidneys are normal, without renal calculi, focal lesion, or hydronephrosis. Bladder is unremarkable. Stomach/Bowel: Stomach, small bowel, and colon are not abnormally distended. Scattered stool in the colon. No wall thickening or inflammatory changes. Appendix is not identified. Vascular/Lymphatic: Aortic atherosclerosis. No enlarged abdominal or pelvic lymph nodes. Reproductive: Prostate gland is atrophic or  surgically absent. No pelvic mass or lymphadenopathy. Other: No free air or free fluid in the abdomen. Small periumbilical hernia containing fat. Cystic structure in the left periumbilical space measuring 4 cm diameter. This is likely a sebaceous  cyst. No change since previous study. Musculoskeletal: Degenerative changes in the spine. 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 IMPRESSION: 1. No acute process demonstrated in the abdomen or pelvis. No significant change since previous study. 2. Chronic elevation of the left hemidiaphragm. 3. Mild aortic atherosclerosis. 4. Cystic structure adjacent to the umbilicus likely representing a sebaceous cyst. No change. Electronically Signed   By: Burman Nieves M.D.   On: 11/02/2023 20:15   DG Chest Portable 1 View  Result Date: 11/02/2023 CLINICAL DATA:  Shortness of breath. Bright red blood in stool this morning. Midsternal chest pain. Fatigue. EXAM: PORTABLE CHEST 1 VIEW COMPARISON:  01/26/2023 FINDINGS: Postoperative changes in the mediastinum with sternotomy wires present. Elevation of the left hemidiaphragm is unchanged since prior study. Lungs are clear. No pleural effusions. No pneumothorax. Mediastinal contours appear intact. IMPRESSION: Elevation of left hemidiaphragm is unchanged.  Lungs are clear. Electronically Signed   By: Burman Nieves M.D.   On: 11/02/2023 20:10      Additional Info: This encounter employed real-time, collaborative documentation. The patient actively reviewed and updated their medical record on a shared screen, ensuring transparency and facilitating joint problem-solving for the problem list, overview, and plan. This approach promotes accurate, informed care. The treatment plan was discussed and reviewed in detail, including medication safety, potential side effects, and all patient questions. We confirmed understanding and comfort with the plan. Follow-up instructions were established, including contacting the office for  any concerns, returning if symptoms worsen, persist, or new symptoms develop, and precautions for potential emergency department visits.

## 2023-11-02 NOTE — ED Triage Notes (Addendum)
Pt c/o bright red blood in stool this morning. Pt c/o SOB, midsternal chest pain, fatiguexmo. Pt takes eliquis.

## 2023-11-02 NOTE — ED Provider Notes (Signed)
I saw and evaluated the patient, reviewed the resident's note and I agree with the findings and plan.     Patient is anticoagulant Eliquis.  He did have bright red stool per rectum this morning.  The patient has some diffuse mild abdominal pain.  No vomiting.  The patient has history of stage IV renal failure is being seen by nephrology.  Patient reports he was diuresed recently on Lasix and lost approximately 15 pounds.  He reports his nephrologist has been discussing impending dialysis and recommends a start the process of getting a dialysis graft established.  Patient is alert with clear mental status.  No respiratory distress at rest.  Patient has some crackles at the bases of the lung fields.  Abdomen is obese and distended.  2+ peripheral edema bilateral lower extremities.  Patient has risk of significant GI bleeding being anticoagulated on Eliquis and platelets of 54,000.  Patient also has comorbid condition of worsening renal failure.  I agree with plan of management.   Arby Barrette, MD 11/02/23 [id number]

## 2023-11-02 NOTE — Patient Instructions (Signed)
VISIT SUMMARY:  During your visit, we discussed your ongoing sinus issues, shortness of breath, gastrointestinal symptoms, leg pain and swelling, and your history of coronary bypass surgery, liver disease, kidney disease, and prediabetes. We reviewed your symptoms and previous treatments, and we have developed a plan to address your current health concerns.  YOUR PLAN:  -GASTROINTESTINAL BLEEDING: You have reported fresh blood in your stool, significant heartburn, and vomiting, which may indicate a serious gastrointestinal bleed. This could be due to a gastric ulcer or gastric varices related to your liver disease. We recommend an immediate visit to the emergency room for a thorough evaluation and lab workup to manage the risk of significant blood loss and potential rapid deterioration. Especially because of your shortness of breath and nose bleeding.  -SHORTNESS OF BREATH: Your recent shortness of breath could be related to blood loss, fluid overload, or underlying heart issues. Given your history of heart disease and recent fluid retention, we will monitor for signs of fluid overload and adjust your Lasix medication as needed. Given your severe heartburn and history of heart disease, the emergency room can evaluate for the causes of this better.  -LIVER DISEASE: Your liver disease complicates your overall health and increases your risk of bleeding, which may contribute to your gastrointestinal symptoms. We will monitor your liver function tests during your ER visit and discuss the potential need for earlier fistula placement with your nephrologist if significant bleeding is confirmed.  -SINUS INFECTION: You have persistent sinus pressure, headaches, and a stuffy nose, which may indicate a refractory sinus infection. This could be complicated by your use of a CPAP machine. We will order a CT scan of your sinuses, refer you to an ENT specialist, and prescribe a different antibiotic. We will avoid  steroids for now due to the risk of worsening a potential ulcer.  -GENERAL HEALTH MAINTENANCE: Your overall health is affected by multiple chronic conditions, including liver disease, heart disease, and kidney issues. We will continue to manage and monitor these conditions, advocate for insurance approval of necessary medications, and consider alternative funding options if needed. We encourage you to follow up with your nephrologist regarding kidney function and potential fistula placement.  INSTRUCTIONS:  Please visit the emergency room immediately for evaluation of your gastrointestinal bleeding. Follow up with your primary care physician after the ER visit, and ensure you have appointments with your nephrologist and cardiologist as needed. Monitor for any new or worsening symptoms and seek immediate care if necessary.

## 2023-11-02 NOTE — H&P (Addendum)
History and Physical  Sean [name] UVO:[id number] DOB: 10/02/1971 DOA: 11/02/2023  Referring physician: Janyth Pupa, PA-EDP  PCP: Lula Olszewski, MD  Outpatient Specialists: Nephrology, cardiology, GI. Patient coming from: Home through his PCPs office.  Chief Complaint: GI bleed   HPI: Sean [name] is a 52 y.o. male with medical history significant for paroxysmal A-fib on Eliquis, coronary artery disease status post CABG, OSA on CPAP, chronic depression, chronic thrombocytopenia, history of upper GI bleed with gastric polyps seen on EGD in 2023, hypothyroidism, severe morbid obesity, nonalcoholic steatohepatitis, chronic HFpEF, CKD 4, hypertension, hyperlipidemia, gout, who presented to the ED, referred by his PCP due to having an episode of bloody stool this morning.  Associated with worsening epigastric pain for the past 5 days and intermittent dark stools.  No reported subjective fevers or chills.  Recently started on p.o. Lasix less than 2 weeks ago due to bilateral lower extremity edema.  In the ED, lab work revealed drop in hemoglobin from baseline of 12 to 11.3, elevated creatinine 5.63 from baseline of 3.2.  The patient had a rectal exam done by EDP, showing hemorrhoids with negative FOBT.  TRH, hospitalist service, was asked to admit.  ED Course: Temperature 98.2.  BP 117/53, pulse 67, respiration rate 15, O2 saturation 98% on room air.  Review of Systems: Review of systems as noted in the HPI. All other systems reviewed and are negative.   Past Medical History:  Diagnosis Date   Abnormal liver enzymes    a. Sees a doctor in [place].   Cardiac cirrhosis    a. possible elevated LFTs/low platelets felt due to cardiac cirrhosis per 2017 admission.   Chronic combined systolic and diastolic CHF (congestive heart failure) (HCC)    CKD (chronic kidney disease) stage 3, GFR 30-59 ml/min (HCC) 01/04/2016   Lab Results  Component  Value  Date/Time     GFR  29.24 (L)   09/30/2022 02:51 PM     GFR  27.27 (L)  09/24/2022 03:40 PM     GFR  24.10 (L)  09/03/2022 03:45 PM     GFR  18.98 (L)  08/25/2022 12:21 PM     GFR  38.98 (L)  04/24/2022 02:39 PM         CKD (chronic kidney disease), stage II    Stage 4   Congenital heart defect    a. rightward rotation of heart, almost dextrocardia   Coronary artery disease    a. s/p CABGx1 in 12/2015.   Gastric polyps 11/25/2022   Gastritis and gastroduodenitis 11/25/2022          Gastroesophageal reflux disease with esophagitis and hemorrhage 04/04/2021   Hematuria    a. Chronic hx of this, no prior etiology determined through workup per patient.   History of umbilical hernia repair 07/10/2011   History of hernia surgery twice prior   Hypercholesteremia    a. Prev taken off statin due to abnormal liver function.   Hypertension    Incisional hernia 07/10/2011   History of hernia surgery twice prior   Morbid obesity (HCC)    Morbid obesity with BMI of 50.0-59.9, adult (HCC) 01/04/2016   After [place] [facility] doctor is setting him up with a wellness clinic to help with weight losS  He reports not having tried anything for weight loss either diet or or medicine or surgery in the past  We failed to get [facility] 10/2022      Wt Readings from Last 10 Encounters:  11/13/22  (!) 384 lb 12.8 oz (174.5 kg)  10/13/22  (!) 385 lb (174.6 kg)  09/30/22  (!) 384 lb (174.2 kg)  09/24/22  (!) 382 lb (1   Nausea and vomiting 11/25/2022   OSA on CPAP    Paroxysmal atrial flutter (HCC) 02/12/2016   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   LIMA to LAD   Sinus bradycardia    Thrombocytopenia (HCC)    Past Surgical History:  Procedure Laterality Date   APPENDECTOMY     BIOPSY  02/19/2021   Procedure: BIOPSY;  Surgeon: Tressia Danas, MD;  Location: [facility] ENDOSCOPY;  Service: Gastroenterology;;   BIOPSY  11/25/2022   Procedure: BIOPSY;  Surgeon: Tressia Danas, MD;  Location: [facility]OS[facility];  Service: Gastroenterology;;   CARDIAC CATHETERIZATION  N/A 12/24/2015   Procedure: Right/Left Heart Cath and Coronary Angiography;  Surgeon: Dolores Patty, MD;  Location: [facility] INVASIVE CV LAB;  Service: Cardiovascular;  Laterality: N/A;   CARDIOVERSION N/A 02/14/2016   Procedure: CARDIOVERSION;  Surgeon: Chrystie Nose, MD;  Location: [facility] ENDOSCOPY;  Service: Cardiovascular;  Laterality: N/A;   COLONOSCOPY WITH PROPOFOL N/A 02/19/2021   Procedure: COLONOSCOPY WITH PROPOFOL;  Surgeon: Tressia Danas, MD;  Location: [facility] ENDOSCOPY;  Service: Gastroenterology;  Laterality: N/A;   CORONARY ARTERY BYPASS GRAFT N/A 12/26/2015   Procedure: Off Pump Coronary Artery Bypass Grafting times one using left internal mammary artery;  Surgeon: Purcell Nails, MD;  Location: [facility] OR;  Service: Open Heart Surgery;  Laterality: N/A;   ESOPHAGOGASTRODUODENOSCOPY (EGD) WITH PROPOFOL N/A 02/19/2021   Procedure: ESOPHAGOGASTRODUODENOSCOPY (EGD) WITH PROPOFOL;  Surgeon: Tressia Danas, MD;  Location: [facility] ENDOSCOPY;  Service: Gastroenterology;  Laterality: N/A;   ESOPHAGOGASTRODUODENOSCOPY (EGD) WITH PROPOFOL N/A 11/25/2022   Procedure: ESOPHAGOGASTRODUODENOSCOPY (EGD) WITH PROPOFOL;  Surgeon: Tressia Danas, MD;  Location: [facility] ENDOSCOPY;  Service: Gastroenterology;  Laterality: N/A;   GALLBLADDER SURGERY     HERNIA REPAIR     IR TRANSCATHETER BX  06/26/2023   IR US GUIDE VASC ACCESS RIGHT  06/26/2023   IR VENOGRAM HEPATIC W HEMODYNAMIC EVALUATION  06/26/2023   LEFT HEART CATH AND CORS/GRAFTS ANGIOGRAPHY N/A 04/15/2017   Procedure: Left Heart Cath and Cors/Grafts Angiography;  Surgeon: Lennette Bihari, MD;  Location: [facility] INVASIVE CV LAB;  Service: Cardiovascular;  Laterality: N/A;   LEFT HEART CATH AND CORS/GRAFTS ANGIOGRAPHY N/A 11/02/2020   Procedure: LEFT HEART CATH AND CORS/GRAFTS ANGIOGRAPHY;  Surgeon: Tonny Bollman, MD;  Location: [facility] INVASIVE CV LAB;  Service: Cardiovascular;  Laterality: N/A;   POLYPECTOMY  02/19/2021   Procedure: POLYPECTOMY;  Surgeon: Tressia Danas, MD;  Location: [facility] ENDOSCOPY;  Service: Gastroenterology;;   TEE WITHOUT CARDIOVERSION N/A 12/26/2015   Procedure: TRANSESOPHAGEAL ECHOCARDIOGRAM (TEE);  Surgeon: Purcell Nails, MD;  Location: [facility] OR;  Service: Open Heart Surgery;  Laterality: N/A;   TEE WITHOUT CARDIOVERSION N/A 02/14/2016   Procedure: TRANSESOPHAGEAL ECHOCARDIOGRAM (TEE);  Surgeon: Chrystie Nose, MD;  Location: [facility] ENDOSCOPY;  Service: Cardiovascular;  Laterality: N/A;    Social History:  reports that he has never smoked. He has never used smokeless tobacco. He reports current alcohol use. He reports that he does not use drugs.   Allergies  Allergen Reactions   Vibra-Tab [Doxycycline] Shortness Of Breath   Glucophage [Metformin] Other (See Comments)    Pt told by his nephrologist not to take this medication due to his kidney function.   Imdur [Isosorbide Dinitrate] Diarrhea   Valtrex [Valacyclovir] Itching   Augmentin [Amoxicillin-Pot Clavulanate] Itching  Tape Rash    Family History  Problem Relation Age of Onset   CAD Father        4 stents ~ 55   Diabetes Father    Diabetes Maternal Grandfather    Diabetes Maternal Uncle    Heart attack Other    Hyperlipidemia Other       Prior to Admission medications   Medication Sig Start Date End Date Taking? Authorizing Provider  acetaminophen (TYLENOL) 500 MG tablet Take 500-1,500 mg by mouth 2 (two) times daily as needed for moderate pain, fever, headache or mild pain.    [provider]  allopurinol (ZYLOPRIM) 100 MG tablet TAKE 1 TABLET(100 MG) BY MOUTH DAILY 10/27/23   Lula Olszewski, MD  amitriptyline (ELAVIL) 25 MG tablet Take 1 tablet (25 mg total) by mouth at bedtime. Patient taking differently: Take 25 mg by mouth at bedtime. 09/14/23   Arnaldo Natal, NP  apixaban (ELIQUIS) 5 MG TABS tablet Take 1 tablet (5 mg total) by mouth 2 (two) times daily. 07/28/23   Chandrasekhar, Rondel Jumbo, MD  cefdinir (OMNICEF) 300 MG capsule Take 1  capsule (300 mg total) by mouth 2 (two) times daily for 10 days. 11/02/23 11/12/23  Lula Olszewski, MD  Cholecalciferol (VITAMIN D3) 125 MCG (5000 UT) CAPS Take 1 capsule (5,000 Units total) by mouth daily at 12 noon. 07/05/23   Dulce Sellar, NP  Cyanocobalamin (VITAMIN B-12 PO) Place 1 tablet under the tongue daily.    [provider]  diltiazem (CARDIZEM SR) 120 MG 12 hr capsule Take 1 capsule (120 mg total) by mouth 2 (two) times daily. 08/04/23   Lula Olszewski, MD  Evolocumab (REPATHA SURECLICK) 140 MG/ML SOAJ INJECT THE CONTENTS OF 1 SYRINGE UNDER THE SKIN EVERY 14 DAYS 12/17/22   Meriam Sprague, MD  famotidine (PEPCID) 20 MG tablet Take 1 tablet (20 mg total) by mouth 2 (two) times daily. 10/28/23   Arnaldo Natal, NP  furosemide (LASIX) 40 MG tablet Take by mouth. 10/22/23   [provider]  JARDIANCE 10 MG TABS tablet TAKE 1 TABLET(10 MG) BY MOUTH DAILY BEFORE BREAKFAST 10/13/23   Lula Olszewski, MD  losartan (COZAAR) 25 MG tablet Take 1 tablet (25 mg total) by mouth daily. 11/13/22   Lula Olszewski, MD  montelukast (SINGULAIR) 10 MG tablet TAKE 1 TABLET(10 MG) BY MOUTH DAILY 09/04/23   Lula Olszewski, MD  nitroGLYCERIN (NITROSTAT) 0.4 MG SL tablet Place 1 tablet (0.4 mg total) under the tongue every 5 (five) minutes as needed for chest pain. 02/10/22   Chandrasekhar, [place] Dragon A, MD  pantoprazole (PROTONIX) 40 MG tablet TAKE 1 TABLET(40 MG) BY MOUTH TWICE DAILY 08/18/23   Lula Olszewski, MD  Semaglutide,0.25 or 0.5MG /DOS, (OZEMPIC, 0.25 OR 0.5 MG/DOSE,) 2 MG/3ML SOPN Inject 0.25 mg into the skin once a week. 11/02/23   Lula Olszewski, MD  sucralfate (CARAFATE) 1 g tablet TAKE 1 TABLET(1 GRAM) BY MOUTH TWICE DAILY. MAY INCREASE TO 4 TIMES DAILY FOR ANY ACUTE SYMPTOMS Patient taking differently: Take 1 tablet by mouth at bedtime. 11/26/22   Tressia Danas, MD  tirzepatide [facility]) 2.5 MG/0.5ML Pen Inject 2.5 mg into the skin once a week. 11/02/23    Lula Olszewski, MD  triamcinolone (NASACORT) 55 MCG/ACT AERO nasal inhaler Place 1 spray into the nose daily. Start with 1 spray each side twice a day for 3 days, then reduce to daily. 10/08/23   Dulce Sellar, NP  Physical Exam: BP (!) 117/53 (BP Location: Right Arm)   Pulse 67   Temp 98.2 F (36.8 C) (Oral)   Resp 15   Ht 5\' 11"  (1.803 m)   Wt (!) 177.7 kg   SpO2 98%   BMI 54.64 kg/m   General: 52 y.o. year-old male well developed well nourished in no acute distress.  Alert and oriented x3. Cardiovascular: Regular rate and rhythm with no rubs or gallops.  No thyromegaly or JVD noted.   1+ pitting edema in lower extremities bilaterally. Respiratory: Clear to auscultation with no wheezes or rales. Good inspiratory effort. Abdomen: Obese with epigastric tenderness on palpation.  Nondistended with normal bowel sounds x4 quadrants. Muskuloskeletal: No cyanosis or clubbing noted bilaterally Neuro: CN II-XII intact, strength, sensation, reflexes Skin: No ulcerative lesions noted or rashes Psychiatry: Judgement and insight appear normal. Mood is appropriate for condition and setting          Labs on Admission:  Basic Metabolic Panel: Recent Labs  Lab 11/02/23 1336  NA 140  K 3.9  CL 106  CO2 25  GLUCOSE 170*  BUN 70*  CREATININE 5.63*  CALCIUM 9.1   Liver Function Tests: Recent Labs  Lab 11/02/23 1336  AST 57*  ALT 71*  ALKPHOS 188*  BILITOT 0.7  PROT 7.1  ALBUMIN 2.8*   No results for input(s): "LIPASE", "AMYLASE" in the last 168 hours. No results for input(s): "AMMONIA" in the last 168 hours. CBC: Recent Labs  Lab 11/02/23 1336  WBC 5.3  HGB 11.3*  HCT 35.8*  MCV 102.6*  PLT 54*   Cardiac Enzymes: No results for input(s): "CKTOTAL", "CKMB", "CKMBINDEX", "TROPONINI" in the last 168 hours.  BNP (last 3 results) Recent Labs    11/02/23 1336  BNP 25.4    ProBNP (last 3 results) No results for input(s): "PROBNP" in the last 8760  hours.  CBG: No results for input(s): "GLUCAP" in the last 168 hours.  Radiological Exams on Admission: CT ABDOMEN PELVIS WO CONTRAST  Result Date: 11/02/2023 CLINICAL DATA:  Acute nonlocalized abdominal pain. Right red blood in stool this morning. EXAM: CT ABDOMEN AND PELVIS WITHOUT CONTRAST TECHNIQUE: Multidetector CT imaging of the abdomen and pelvis was performed following the standard protocol without IV contrast. RADIATION DOSE REDUCTION: This exam was performed according to the departmental dose-optimization program which includes automated exposure control, adjustment of the mA and/or kV according to patient size and/or use of iterative reconstruction technique. COMPARISON:  01/26/2023 FINDINGS: Lower chest: Lung bases are clear. Postoperative changes in the mediastinum. Elevation of the left hemidiaphragm is unchanged. Hepatobiliary: No focal liver abnormality is seen. Status post cholecystectomy. No biliary dilatation. Pancreas: Unremarkable. No pancreatic ductal dilatation or surrounding inflammatory changes. Spleen: Normal in size without focal abnormality. Adrenals/Urinary Tract: Adrenal glands are unremarkable. Kidneys are normal, without renal calculi, focal lesion, or hydronephrosis. Bladder is unremarkable. Stomach/Bowel: Stomach, small bowel, and colon are not abnormally distended. Scattered stool in the colon. No wall thickening or inflammatory changes. Appendix is not identified. Vascular/Lymphatic: Aortic atherosclerosis. No enlarged abdominal or pelvic lymph nodes. Reproductive: Prostate gland is atrophic or surgically absent. No pelvic mass or lymphadenopathy. Other: No free air or free fluid in the abdomen. Small periumbilical hernia containing fat. Cystic structure in the left periumbilical space measuring 4 cm diameter. This is likely a sebaceous cyst. No change since previous study. Musculoskeletal: Degenerative changes in the spine. Spondylolysis with mild spondylolisthesis at  L4-5. IMPRESSION: 1. No acute process demonstrated in the abdomen  or pelvis. No significant change since previous study. 2. Chronic elevation of the left hemidiaphragm. 3. Mild aortic atherosclerosis. 4. Cystic structure adjacent to the umbilicus likely representing a sebaceous cyst. No change. Electronically Signed   By: Burman Nieves M.D.   On: 11/02/2023 20:15   DG Chest Portable 1 View  Result Date: 11/02/2023 CLINICAL DATA:  Shortness of breath. Bright red blood in stool this morning. Midsternal chest pain. Fatigue. EXAM: PORTABLE CHEST 1 VIEW COMPARISON:  01/26/2023 FINDINGS: Postoperative changes in the mediastinum with sternotomy wires present. Elevation of the left hemidiaphragm is unchanged since prior study. Lungs are clear. No pleural effusions. No pneumothorax. Mediastinal contours appear intact. IMPRESSION: Elevation of left hemidiaphragm is unchanged.  Lungs are clear. Electronically Signed   By: Burman Nieves M.D.   On: 11/02/2023 20:10    EKG: I independently viewed the EKG done and my findings are as followed: Normal sinus rhythm rate of 70.  Nonspecific ST-T changes.  QTc 414.  Assessment/Plan Present on Admission:  AKI (acute kidney injury) (HCC)  Principal Problem:   AKI (acute kidney injury) (HCC)  AKI on CKD 4, prerenal in the setting of dehydration from poor oral intake Baseline creatinine appears to be 3.2 with GFR of 21. Presented with creatinine of 5.63 with GFR of 12 Start IV fluid hydration NS at 100 cc/h x 1 day Monitor urine output Avoid nephrotoxic agents, dehydration and hypotension. Repeat renal function panel in the morning On noncontrast CT abdomen and pelvis kidneys are normal, no renal calculi, lesions, or hydronephrosis, bladder is unremarkable.  GI bleed, unspecified Acute blood loss anemia Anemia of chronic disease History of bleeding gastric polyps (hyperplastic, no intestinal metaplasia, dysplasia or malignancy) History of chronic  gastritis Start IV H2 blocker, instead of PPI (due to AKI) twice daily Reportedly had 1 bloody stool today CT abdomen pelvis without contrast was unrevealing Per EDP, PA Jackson, hemorrhoids on rectal exam, FOBT was negative. Serial H&H every 6 hours x 3 Closely monitor vitals and maintain MAP greater than 65  Acute blood loss anemia in the setting of the above Baseline hemoglobin 12 Presented with hemoglobin of 11.3 Reported GI bleed, unspecified Hold off home DOAC, Eliquis. Serial H&H every 6 hours Transfuse as indicated.  Paroxysmal A-fib on Eliquis Hold off Eliquis due to GI bleed. BPs are soft, hold off home diltiazem  Essential hypertension BPs are soft Hold off home antihypertensives Hold off home losartan and furosemide due to AKI Closely monitor vital signs Maintain MAP greater than 65  Hyperlipidemia On Repatha injections  Type 2 diabetes with hyperglycemia Serum glucose on presentation 170. Last hemoglobin A1c 5.6 on 06/17/2023 Obtain A1c Start insulin sliding scale  Elevated liver enzymes Alkaline phosphatase, AST and ALT elevated Status post cholecystectomy No focal liver abnormality seen, no biliary dilatation. Avoid hepatotoxic agents Repeat CMP in the morning  Chronic HFpEF Euvolemic on exam Last 2D echo done on 04/16/2023 showed LVEF 50 to 55% with no regional wall motion abnormalities. Start strict I's and O's and daily weight Closely monitor volume status while on IV fluid hydration.  Coronary artery disease status post CABG Resume home regimen Monitor on telemetry  OSA Resume home CPAP nightly  Severe morbid obesity BMI 54 Recommend weight loss outpatient regular physical activity and healthy dieting.  Chronic depression Resume home amitriptyline  Gout Hold off home allopurinol due to acute renal insufficiency   Critical care time: 75 minutes.   DVT prophylaxis: SCDs  Code Status: Full code  Family Communication: None at  bedside  Disposition Plan: Admitted to progressive care unit  Consults called: GI, Dr. Lavon Paganini, via secure chat.  Admission status: Inpatient status.   Status is: Inpatient The patient requires at least 2 midnights for further evaluation and treatment of present condition.   Darlin Drop MD [facility] Pager [phone]  If 7PM-7AM, please contact night-coverage [email or web address] Password [email or web address]  11/02/2023, 8:42 PM

## 2023-11-02 NOTE — ED Provider Notes (Signed)
EMERGENCY DEPARTMENT AT [facility] Provider Note   CSN: [id number] Arrival date & time: 11/02/23  1306     History  Chief Complaint  Patient presents with   GI Bleeding    Sean [name] is a 52 y.o. male with a PMH of CABG on Eliquis, HTN, MGUS, CHF, CKD, hepatic steatosis who presented to the ED from his PCPs office for rectal bleeding.  Patient reports this morning, he had an episode of bright red bleeding in the toilet.  He denies ever having an episode like this before and states he had no stool in the toilet.  He endorses vague general abdominal pain without focality.  Denies nausea or vomiting.  Endorses shortness of breath that has been ongoing for 1 month.  States he has been compliant with his Lasix 40 mg daily that he was started on for his CKD.  He endorses congestion secondary to chronic sinus issues but denies fever, cough.  HPI     Home Medications Prior to Admission medications   Medication Sig Start Date End Date Taking? Authorizing Provider  acetaminophen (TYLENOL) 500 MG tablet Take 500-1,500 mg by mouth 2 (two) times daily as needed for moderate pain, fever, headache or mild pain.    [provider]  allopurinol (ZYLOPRIM) 100 MG tablet TAKE 1 TABLET(100 MG) BY MOUTH DAILY 10/27/23   Lula Olszewski, MD  amitriptyline (ELAVIL) 25 MG tablet Take 1 tablet (25 mg total) by mouth at bedtime. Patient taking differently: Take 25 mg by mouth at bedtime. 09/14/23   Arnaldo Natal, NP  apixaban (ELIQUIS) 5 MG TABS tablet Take 1 tablet (5 mg total) by mouth 2 (two) times daily. 07/28/23   Chandrasekhar, Rondel Jumbo, MD  cefdinir (OMNICEF) 300 MG capsule Take 1 capsule (300 mg total) by mouth 2 (two) times daily for 10 days. 11/02/23 11/12/23  Lula Olszewski, MD  Cholecalciferol (VITAMIN D3) 125 MCG (5000 UT) CAPS Take 1 capsule (5,000 Units total) by mouth daily at 12 noon. 07/05/23   Dulce Sellar, NP  Cyanocobalamin (VITAMIN  B-12 PO) Place 1 tablet under the tongue daily.    [provider]  diltiazem (CARDIZEM SR) 120 MG 12 hr capsule Take 1 capsule (120 mg total) by mouth 2 (two) times daily. 08/04/23   Lula Olszewski, MD  Evolocumab (REPATHA SURECLICK) 140 MG/ML SOAJ INJECT THE CONTENTS OF 1 SYRINGE UNDER THE SKIN EVERY 14 DAYS 12/17/22   Meriam Sprague, MD  famotidine (PEPCID) 20 MG tablet Take 1 tablet (20 mg total) by mouth 2 (two) times daily. 10/28/23   Arnaldo Natal, NP  furosemide (LASIX) 40 MG tablet Take by mouth. 10/22/23   [provider]  JARDIANCE 10 MG TABS tablet TAKE 1 TABLET(10 MG) BY MOUTH DAILY BEFORE BREAKFAST 10/13/23   Lula Olszewski, MD  losartan (COZAAR) 25 MG tablet Take 1 tablet (25 mg total) by mouth daily. 11/13/22   Lula Olszewski, MD  montelukast (SINGULAIR) 10 MG tablet TAKE 1 TABLET(10 MG) BY MOUTH DAILY 09/04/23   Lula Olszewski, MD  nitroGLYCERIN (NITROSTAT) 0.4 MG SL tablet Place 1 tablet (0.4 mg total) under the tongue every 5 (five) minutes as needed for chest pain. 02/10/22   Chandrasekhar, [place] Dragon A, MD  pantoprazole (PROTONIX) 40 MG tablet TAKE 1 TABLET(40 MG) BY MOUTH TWICE DAILY 08/18/23   Lula Olszewski, MD  Semaglutide,0.25 or 0.5MG /DOS, (OZEMPIC, 0.25 OR 0.5 MG/DOSE,) 2 MG/3ML SOPN Inject 0.25 mg  into the skin once a week. 11/02/23   Lula Olszewski, MD  sucralfate (CARAFATE) 1 g tablet TAKE 1 TABLET(1 GRAM) BY MOUTH TWICE DAILY. MAY INCREASE TO 4 TIMES DAILY FOR ANY ACUTE SYMPTOMS Patient taking differently: Take 1 tablet by mouth at bedtime. 11/26/22   Tressia Danas, MD  tirzepatide [facility]) 2.5 MG/0.5ML Pen Inject 2.5 mg into the skin once a week. 11/02/23   Lula Olszewski, MD  triamcinolone (NASACORT) 55 MCG/ACT AERO nasal inhaler Place 1 spray into the nose daily. Start with 1 spray each side twice a day for 3 days, then reduce to daily. 10/08/23   Dulce Sellar, NP      Allergies    Vibra-tab [doxycycline], Glucophage  [metformin], Imdur [isosorbide dinitrate], Valtrex [valacyclovir], Augmentin [amoxicillin-pot clavulanate], and Tape    Review of Systems   Review of Systems  Physical Exam Updated Vital Signs BP (!) 117/53 (BP Location: Right Arm)   Pulse 67   Temp 98.2 F (36.8 C) (Oral)   Resp 15   Ht 5\' 11"  (1.803 m)   Wt (!) 177.7 kg   SpO2 98%   BMI 54.64 kg/m  Physical Exam Exam conducted with a chaperone present.  Constitutional:      General: He is not in acute distress.    Appearance: Normal appearance. He is not ill-appearing.  HENT:     Head: Normocephalic and atraumatic.     Nose: Nose normal.     Mouth/Throat:     Mouth: Mucous membranes are moist.     Pharynx: Oropharynx is clear. No oropharyngeal exudate or posterior oropharyngeal erythema.  Eyes:     Pupils: Pupils are equal, round, and reactive to light.  Cardiovascular:     Rate and Rhythm: Normal rate and regular rhythm.     Heart sounds: Normal heart sounds. No murmur heard.    No friction rub. No gallop.  Pulmonary:     Breath sounds: Normal breath sounds. No stridor. No wheezing, rhonchi or rales.  Abdominal:     Palpations: Abdomen is soft.     Tenderness: There is abdominal tenderness. There is no guarding or rebound.     Comments: Generalized tenderness to palpation with guarding in the bilateral lower quadrants  Genitourinary:    Comments: Rectal exam with hemorrhoids, do not appear thrombosed, no anal fissures Musculoskeletal:     Right lower leg: Edema present.     Left lower leg: Edema present.     Comments: Compression stockings in place bilaterally  Skin:    General: Skin is warm and dry.     Capillary Refill: Capillary refill takes less than 2 seconds.  Neurological:     General: No focal deficit present.     Mental Status: He is alert.     ED Results / Procedures / Treatments   Labs (all labs ordered are listed, but only abnormal results are displayed) Labs Reviewed  COMPREHENSIVE METABOLIC  PANEL - Abnormal; Notable for the following components:      Result Value   Glucose, Bld 170 (*)    BUN 70 (*)    Creatinine, Ser 5.63 (*)    Albumin 2.8 (*)    AST 57 (*)    ALT 71 (*)    Alkaline Phosphatase 188 (*)    GFR, Estimated 12 (*)    All other components within normal limits  CBC - Abnormal; Notable for the following components:   RBC 3.49 (*)    Hemoglobin 11.3 (*)  HCT 35.8 (*)    MCV 102.6 (*)    Platelets 54 (*)    All other components within normal limits  BRAIN NATRIURETIC PEPTIDE  HEMOGLOBIN AND HEMATOCRIT, BLOOD  HEMOGLOBIN AND HEMATOCRIT, BLOOD  HEMOGLOBIN AND HEMATOCRIT, BLOOD  POC OCCULT BLOOD, ED  TYPE AND SCREEN  TROPONIN I (HIGH SENSITIVITY)    EKG None  Radiology CT ABDOMEN PELVIS WO CONTRAST  Result Date: 11/02/2023 CLINICAL DATA:  Acute nonlocalized abdominal pain. Right red blood in stool this morning. EXAM: CT ABDOMEN AND PELVIS WITHOUT CONTRAST TECHNIQUE: Multidetector CT imaging of the abdomen and pelvis was performed following the standard protocol without IV contrast. RADIATION DOSE REDUCTION: This exam was performed according to the departmental dose-optimization program which includes automated exposure control, adjustment of the mA and/or kV according to patient size and/or use of iterative reconstruction technique. COMPARISON:  01/26/2023 FINDINGS: Lower chest: Lung bases are clear. Postoperative changes in the mediastinum. Elevation of the left hemidiaphragm is unchanged. Hepatobiliary: No focal liver abnormality is seen. Status post cholecystectomy. No biliary dilatation. Pancreas: Unremarkable. No pancreatic ductal dilatation or surrounding inflammatory changes. Spleen: Normal in size without focal abnormality. Adrenals/Urinary Tract: Adrenal glands are unremarkable. Kidneys are normal, without renal calculi, focal lesion, or hydronephrosis. Bladder is unremarkable. Stomach/Bowel: Stomach, small bowel, and colon are not abnormally  distended. Scattered stool in the colon. No wall thickening or inflammatory changes. Appendix is not identified. Vascular/Lymphatic: Aortic atherosclerosis. No enlarged abdominal or pelvic lymph nodes. Reproductive: Prostate gland is atrophic or surgically absent. No pelvic mass or lymphadenopathy. Other: No free air or free fluid in the abdomen. Small periumbilical hernia containing fat. Cystic structure in the left periumbilical space measuring 4 cm diameter. This is likely a sebaceous cyst. No change since previous study. Musculoskeletal: Degenerative changes in the spine. 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 IMPRESSION: 1. No acute process demonstrated in the abdomen or pelvis. No significant change since previous study. 2. Chronic elevation of the left hemidiaphragm. 3. Mild aortic atherosclerosis. 4. Cystic structure adjacent to the umbilicus likely representing a sebaceous cyst. No change. Electronically Signed   By: Burman Nieves M.D.   On: 11/02/2023 20:15   DG Chest Portable 1 View  Result Date: 11/02/2023 CLINICAL DATA:  Shortness of breath. Bright red blood in stool this morning. Midsternal chest pain. Fatigue. EXAM: PORTABLE CHEST 1 VIEW COMPARISON:  01/26/2023 FINDINGS: Postoperative changes in the mediastinum with sternotomy wires present. Elevation of the left hemidiaphragm is unchanged since prior study. Lungs are clear. No pleural effusions. No pneumothorax. Mediastinal contours appear intact. IMPRESSION: Elevation of left hemidiaphragm is unchanged.  Lungs are clear. Electronically Signed   By: Burman Nieves M.D.   On: 11/02/2023 20:10    Procedures Procedures    Medications Ordered in ED Medications  pantoprazole (PROTONIX) injection 40 mg (has no administration in time range)  0.9 %  sodium chloride infusion (has no administration in time range)  acetaminophen (TYLENOL) tablet 650 mg (has no administration in time range)  prochlorperazine (COMPAZINE) injection  5 mg (has no administration in time range)  melatonin tablet 5 mg (has no administration in time range)  polyethylene glycol (MIRALAX / GLYCOLAX) packet 17 g (has no administration in time range)    ED Course/ Medical Decision Making/ A&P                                 Medical Decision Making Amount and/or Complexity  of Data Reviewed Labs: ordered. Radiology: ordered. ECG/medicine tests: ordered.  Risk Decision regarding hospitalization.   CMP with creatinine 5.63, up from baseline of around 3.2 previously.  CBC with hemoglobin 1.3, around his baseline.  Troponin 3, BNP 25.  Hemoccult negative.  CT scan with no acute findings in the abdomen or pelvis.  Patient was discussed with the hospitalist service given his significant AKI, who has agreed to admit the patient.        Final Clinical Impression(s) / ED Diagnoses Final diagnoses:  AKI (acute kidney injury) [facility])    Rx / DC Orders ED Discharge Orders     None         Janyth Pupa, MD 11/02/23 9629    Arby Barrette, MD 11/09/23 [phone]

## 2023-11-03 ENCOUNTER — Telehealth: Payer: Self-pay

## 2023-11-03 ENCOUNTER — Other Ambulatory Visit (HOSPITAL_COMMUNITY): Payer: Self-pay

## 2023-11-03 DIAGNOSIS — K7581 Nonalcoholic steatohepatitis (NASH): Secondary | ICD-10-CM | POA: Diagnosis not present

## 2023-11-03 DIAGNOSIS — K625 Hemorrhage of anus and rectum: Secondary | ICD-10-CM

## 2023-11-03 DIAGNOSIS — N179 Acute kidney failure, unspecified: Secondary | ICD-10-CM | POA: Diagnosis not present

## 2023-11-03 HISTORY — DX: Hemorrhage of anus and rectum: K62.5

## 2023-11-03 LAB — COMPREHENSIVE METABOLIC PANEL
ALT: 71 U/L — ABNORMAL HIGH (ref 0–44)
AST: 53 U/L — ABNORMAL HIGH (ref 15–41)
Albumin: 2.6 g/dL — ABNORMAL LOW (ref 3.5–5.0)
Alkaline Phosphatase: 185 U/L — ABNORMAL HIGH (ref 38–126)
Anion gap: 9 (ref 5–15)
BUN: 65 mg/dL — ABNORMAL HIGH (ref 6–20)
CO2: 23 mmol/L (ref 22–32)
Calcium: 8.7 mg/dL — ABNORMAL LOW (ref 8.9–10.3)
Chloride: 111 mmol/L (ref 98–111)
Creatinine, Ser: 5.44 mg/dL — ABNORMAL HIGH (ref 0.61–1.24)
GFR, Estimated: 12 mL/min — ABNORMAL LOW (ref >60)
Glucose, Bld: 140 mg/dL — ABNORMAL HIGH (ref 70–99)
Potassium: 3.7 mmol/L (ref 3.5–5.1)
Sodium: 143 mmol/L (ref 135–145)
Total Bilirubin: 0.6 mg/dL (ref <1.2)
Total Protein: 6.3 g/dL — ABNORMAL LOW (ref 6.5–8.1)

## 2023-11-03 LAB — CBG MONITORING, ED
Glucose-Capillary: 111 mg/dL — ABNORMAL HIGH (ref 70–99)
Glucose-Capillary: 98 mg/dL (ref 70–99)
Glucose-Capillary: 105 mg/dL — ABNORMAL HIGH (ref 70–99)

## 2023-11-03 LAB — CBC
HCT: 35.3 % — ABNORMAL LOW (ref 39.0–52.0)
Hemoglobin: 11.3 g/dL — ABNORMAL LOW (ref 13.0–17.0)
MCH: 33.5 pg (ref 26.0–34.0)
MCHC: 32 g/dL (ref 30.0–36.0)
MCV: 104.7 fL — ABNORMAL HIGH (ref 80.0–100.0)
Platelets: 56 10*3/uL — ABNORMAL LOW (ref 150–400)
RBC: 3.37 MIL/uL — ABNORMAL LOW (ref 4.22–5.81)
RDW: 14.8 % (ref 11.5–15.5)
WBC: 5.1 10*3/uL (ref 4.0–10.5)
nRBC: 0 % (ref 0.0–0.2)

## 2023-11-03 LAB — URINALYSIS, COMPLETE (UACMP) WITH MICROSCOPIC
Bacteria, UA: NONE SEEN
Bilirubin Urine: NEGATIVE
Glucose, UA: >=500 mg/dL — AB
Hgb urine dipstick: NEGATIVE
Ketones, ur: NEGATIVE mg/dL
Leukocytes,Ua: NEGATIVE
Nitrite: NEGATIVE
Protein, ur: 100 mg/dL — AB
Specific Gravity, Urine: 1.011 (ref 1.005–1.030)
pH: 6 (ref 5.0–8.0)

## 2023-11-03 LAB — PHOSPHORUS: Phosphorus: 4.9 mg/dL — ABNORMAL HIGH (ref 2.5–4.6)

## 2023-11-03 LAB — MAGNESIUM: Magnesium: 2.3 mg/dL (ref 1.7–2.4)

## 2023-11-03 LAB — GLUCOSE, CAPILLARY: Glucose-Capillary: 167 mg/dL — ABNORMAL HIGH (ref 70–99)

## 2023-11-03 MED ORDER — SENNOSIDES-DOCUSATE SODIUM 8.6-50 MG PO TABS: 1.0000 | ORAL_TABLET | Freq: Every evening | ORAL | Status: DC | PRN

## 2023-11-03 MED ORDER — GUAIFENESIN 100 MG/5ML PO LIQD: 5.0000 mL | ORAL | Status: DC | PRN

## 2023-11-03 MED ORDER — IPRATROPIUM-ALBUTEROL 0.5-2.5 (3) MG/3ML IN SOLN: 3.0000 mL | RESPIRATORY_TRACT | Status: DC | PRN

## 2023-11-03 MED ORDER — NYSTATIN 100000 UNIT/GM EX POWD
Freq: Three times a day (TID) | CUTANEOUS | Status: DC
  Filled 2023-11-03: qty 15

## 2023-11-03 MED ORDER — HYDROCORTISONE ACETATE 25 MG RE SUPP
25.0000 mg | Freq: Two times a day (BID) | RECTAL | Status: DC
  Administered 2023-11-03 – 2023-11-04 (×2): 25 mg via RECTAL
  Filled 2023-11-03 (×3): qty 1

## 2023-11-03 MED ORDER — POLYETHYLENE GLYCOL 3350 17 G PO PACK
17.0000 g | PACK | Freq: Every day | ORAL | Status: DC
  Administered 2023-11-03 – 2023-11-04 (×2): 17 g via ORAL
  Filled 2023-11-03 (×2): qty 1

## 2023-11-03 MED ORDER — GLUCAGON HCL RDNA (DIAGNOSTIC) 1 MG IJ SOLR: 1.0000 mg | INTRAMUSCULAR | Status: DC | PRN

## 2023-11-03 MED ORDER — METOPROLOL TARTRATE 5 MG/5ML IV SOLN: 5.0000 mg | INTRAVENOUS | Status: DC | PRN

## 2023-11-03 MED ORDER — HYDRALAZINE HCL 20 MG/ML IJ SOLN: 10.0000 mg | INTRAMUSCULAR | Status: DC | PRN

## 2023-11-03 MED ORDER — SODIUM CHLORIDE 0.9 % IV SOLN: INTRAVENOUS | Status: AC

## 2023-11-03 NOTE — Hospital Course (Addendum)
Brief Narrative:  52 year old with history of paroxysmal A-fib on Eliquis, CAD status post CABG, OSA on CPAP, chronic depression, chronic thrombocytopenia, upper GI bleed with history of polyps seen on EGD in 2023, hypothyroidism, severe morbid obese CAD, nonalcoholic steatosis, CHF with preserved EF, CKD stage IV, HLD, gout, HTN comes to the ED with complaints of epigastric pain and dark stools.  Upon admission noted to have AKI as well.   Assessment & Plan:  Principal Problem:   AKI (acute kidney injury) (HCC)    AKI on CKD 4, prerenal in the setting of dehydration from poor oral intake Baseline creatinine 3.2, admission creatinine 5.6.  Continue IV fluids.  Slowly improving.  Noncontrast CT abdomen pelvis shows normal kidney.  Follows outpatient [facility] will consult nephrology due to recent multiple changes in his diuretics.  Colonoscopy in March 22 showed polyps Endoscopy December 2023 showed esophageal polyps   GI bleed, unspecified Acute blood loss anemia Anemia of chronic disease History of bleeding gastric polyps (hyperplastic, no intestinal metaplasia, dysplasia or malignancy) History of chronic gastritis For now we will place patient on Pepcid and PPI.  Also on Carafate.  Hemoglobin is overall stable at this time.  FOBT negative in the ER. 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 Melvyn Neth   Acute blood loss anemia in the setting of the above Baseline hemoglobin 12, admission hemoglobin 11.3.  Home Eliquis is on hold.   Paroxysmal A-fib on Eliquis Hold off Eliquis due to GI bleed. BPs are soft, hold off home diltiazem   Essential hypertension Home med on hold.  IV as needed   Hyperlipidemia On Repatha injections   Type 2 diabetes with hyperglycemia Sliding scale and Accu-Cheks   Elevated liver enzymes Alkaline phosphatase, AST and ALT elevated Status post  cholecystectomy Monitor LFTs closely   Chronic HFpEF, EF 50% Euvolemic on exam Last 2D echo done on 04/16/2023 showed LVEF 50 to 55% with no regional wall motion abnormalities. Start strict I's and O's and daily weight Closely monitor volume status while on IV fluid hydration.   Coronary artery disease status post CABG, not aspirin Resume home regimen Monitor on telemetry   OSA Resume home CPAP nightly   Severe morbid obesity BMI 54 Recommend weight loss outpatient regular physical activity and healthy dieting.   Chronic depression Resume home amitriptyline   Gout Hold off home allopurinol due to acute renal insufficiency     DVT prophylaxis: SCDs Start: 11/02/23 2040 Code Status: Full code Family Communication: Sister on the phone while in the room with him Status is: Inpatient Continue hospital stay for management of AKI    Subjective:  Seen at bedside, no further bleeding.  Tells me that recently his outpatient nephrology has been changing his diuretics and trying to balance between his renal function and AKI.  Examination:  General exam: Appears calm and comfortable ; obese Respiratory system: Clear to auscultation. Respiratory effort normal. Cardiovascular system: S1 & S2 heard, RRR. No JVD, murmurs, rubs, gallops or clicks. No pedal edema. Gastrointestinal system: Abdomen is nondistended, soft and nontender. No organomegaly or masses felt. Normal bowel sounds heard. Central nervous system: Alert and oriented. No focal neurological deficits. Extremities: Symmetric 5 x 5 power. Skin: No rashes, lesions or ulcers Psychiatry: Judgement and insight appear normal. Mood & affect appropriate.

## 2023-11-03 NOTE — Progress Notes (Signed)
   11/03/23 0040  BiPAP/CPAP/SIPAP  $ Non-Invasive Ventilator  Non-Invasive Vent Set Up;Non-Invasive Vent Initial  $ Face Mask Large  Yes  BiPAP/CPAP/SIPAP Pt Type Adult  BiPAP/CPAP/SIPAP Resmed  Mask Type Full face mask  Mask Size Large  FiO2 (%) 21 %  Patient Home Equipment No  Auto Titrate Yes

## 2023-11-03 NOTE — Consult Note (Signed)
Consultation Note   Referring Provider:  [facility] PCP: Lula Olszewski, MD Primary Gastroenterologist: Previously Tressia Danas MD . Also followed by [facility]        Reason for Consultation: hHmatochezia  DOA: 11/02/2023         Hospital Day: 2   ASSESSMENT    Brief Narrative:  52 y.o. year old male with with multiple medical problems such as morbid obesity, hypertension, diastolic and systolic CHF with EF of 50 -55% , CAD s/p CABG 2017, paroxysmal atrial fibrillation on Eliquis on Eliquis, CKD, MGUS, OSA uses CPAP , colon polyps, and Metabolic dysfunction associated steatohepatitis.   Patient admitted with [facility], chest pain, blood in stool  Painless rectal bleeding x 1 episode ~ 24 hours ago. He has chronic and likely multifactorial macrocytic anemia . Hgb at baseline. Suspect hemorrhoidal bleed on Eliquis. Brown, heme negative stool in Eliquis Hgb 11.3 ( at baseline). Non -contrast CT AP without acute findings.   Occasional constipation with straining  Chronic intermittent nausea / vomiting.  Vomiting over weekend but no hematemesis  Chronic gastritis without H.pylori on EGD with biopsies Dec 2023  Chronic intermittent mid lower abdominal pain relieved with defecation  Chronic thrombocytopenia, ? 2/2 MGUS and also splenomegaly Platelets in 50s  Metabolic dysfunction-associated steatohepatitis (MASH). Liver biopsy in July 2024 suggested stage 3 of 4 fibrosis. He is followed by [facility] ( last seen 06/12/23).   AKI on CKD 4  AFIB, on Eliquis  AKI on CKD4 Cr 5.4, up from 3.2 in late August  History of colon polyps.  Small tubular adenoma removed in March 2022   Principal Problem:   AKI (acute kidney injury) (HCC)     PLAN:   --Anusol supp BID x 7 days --Increase water intake to 48 oz daily --Didn't recommend fiber supplement as he has abdominal bloating at time.   HPI   Patient seen  at PCP's office yesterday with heartburn, vomiting and blood in stool There was concern for an upper GI bleeding and ED evaluation advised.   Notable labs / Imaging / Events this admission  :   Cr 5.4 Alk phos 185, Albumin 2.6 AST 53, ALT 71, Tbili normal WGC 5.1 Hgb 11.3 ( at baseline) MCV 104 Platelets 56 Non-contrast CT AP - no acute findings   Previous GI Evaluations   Liver biopsy July 2024 -- Heavily fragmented core of liver with essentially unremarkable hepatic parenchyma and portal tracts with mild portal fibrosis, mild chronic inflammation and questionable bile ductular proliferation. The hepatic parenchyma itself is overall unremarkable without the presence of steatosis, ballooning or apoptotic bodies.  It should be noted there is quite a bit of fragmentation which can be a sign of a greater degree of fibrosis/cirrhosis then can be identified on the needle core biopsy given a diminished number of portal triads.  The portal triads available for evaluation do show a mild degree of portalfibrosis but without definitive evidence of periportal or bridging type fibrosis.  There is the presence of questionable bile ductular reaction which again raises the possibility of fibrosis that is not as readily identifiable on the HE/trichrome stains.  There is no central vein or perisinusoidal/pericellular fibrosis.  An iron stain  shows no stainable iron a PAS and reticulin stain are also reviewed in the workup of the case. In summary, there is no ongoing hepatocellular damage identified including the presence of steatosis.  There are indications of an increased level of fibrosis given the fragmentation and bile ductular proliferation; however, the overall stage is difficult to evaluate on the current biopsy but is suggested to be likely stage III of 4.  If clinically indicated a repeat biopsy could be considered.   EGD 11/25/2022: - Normal esophagus.  - Three gastric polyps. Some  spontaneous oozing. Biopsied.  - Abnormal gastric mucosa with both erythema and loss of vascular pattern. Biopsied.  - No evidence for portal hypertensive gastropathy, esophageal varices, or gastric varices.  - Erythematous duodenopathy. Biopsied. A. DUODENUM, BIOPSY: - Duodenal mucosa with prominent mucosal lymphocytic aggregates.  No intraepithelial lymphocytes, villous atrophy or malignancy.  No granulomas identified. - Helicobacter pylori immunostaining negative.  B. STOMACH, POLYPECTOMY: - Hyperplastic polyp.  No intestinal metaplasia, dysplasia or malignancy.  C. STOMACH, ANTRUM, LESSER CURVE, BIOPSY: - Reactive gastropathy. - Intestinal metaplasia.  No dysplasia or malignancy.  D. STOMACH, ANTRUM, GREATER CURVE, BIOPSY: - Reactive gastropathy.  No intestinal metaplasia, dysplasia or malignancy. - Focal chronic gastritis.  E. STOMACH, BODY, BIOPSY: - Gastric mucosa with patchy chronic gastritis.  Helicobacter pylori immunostaining negative. - No intestinal metaplasia, dysplasia or malignancy.  F. STOMACH, INCISURA, BIOPSY: - Focal chronic gastritis.  Helicobacter pylori immunostaining negative. - No intestinal metaplasia, dysplasia or malignancy.  G. STOMACH, FUNDUS, BIOPSY: - Patchy chronic gastritis.  Helicobacter pylori immunostaining negative. - No intestinal metaplasia, dysplasia or malignancy.   VIRTUAL COLONOSCOPY 04/22/2022:  No polyp, mass or stricture.  Virtual colonoscopy is not designed to detect diminutive polyps (i.e., less than or equal to 5 mm), the presence or absence of which may not affect clinical management  March 2022 Screening colonoscopy  Unable to advance beyond the distal ascending colon. There was not enough scope. Procedure technically difficult and complex due to a redundant colon, significant looping and a tortuous colon.  The quality of the bowel preparation was excellent.      Labs and Imaging: Recent Labs    11/02/23 1336  11/02/23 2058 11/03/23 0324  WBC 5.3  --  5.1  HGB 11.3* 11.6* 11.3*  HCT 35.8* 35.9* 35.3*  PLT 54*  --  56*   Recent Labs    11/02/23 1336 11/03/23 0324  NA 140 143  K 3.9 3.7  CL 106 111  CO2 25 23  GLUCOSE 170* 140*  BUN 70* 65*  CREATININE 5.63* 5.44*  CALCIUM 9.1 8.7*   Recent Labs    11/03/23 0324  PROT 6.3*  ALBUMIN 2.6*  AST 53*  ALT 71*  ALKPHOS 185*  BILITOT 0.6   No results for input(s): "HEPBSAG", "HCVAB", "HEPAIGM", "HEPBIGM" in the last 72 hours. No results for input(s): "LABPROT", "INR" in the last 72 hours.    Past Medical History:  Diagnosis Date   Abnormal liver enzymes    a. Sees a doctor in [place].   Cardiac cirrhosis    a. possible elevated LFTs/low platelets felt due to cardiac cirrhosis per 2017 admission.   Chronic combined systolic and diastolic CHF (congestive heart failure) (HCC)    CKD (chronic kidney disease) stage 3, GFR 30-59 ml/min (HCC) 01/04/2016   Lab Results  Component  Value  Date/Time     GFR  29.24 (L)  09/30/2022 02:51 PM     GFR  27.27 (L)  09/24/2022 03:40 PM     GFR  24.10 (L)  09/03/2022 03:45 PM     GFR  18.98 (L)  08/25/2022 12:21 PM     GFR  38.98 (L)  04/24/2022 02:39 PM         CKD (chronic kidney disease), stage II    Stage 4   Congenital heart defect    a. rightward rotation of heart, almost dextrocardia   Coronary artery disease    a. s/p CABGx1 in 12/2015.   Gastric polyps 11/25/2022   Gastritis and gastroduodenitis 11/25/2022          Gastroesophageal reflux disease with esophagitis and hemorrhage 04/04/2021   Hematuria    a. Chronic hx of this, no prior etiology determined through workup per patient.   History of umbilical hernia repair 07/10/2011   History of hernia surgery twice prior   Hypercholesteremia    a. Prev taken off statin due to abnormal liver function.   Hypertension    Incisional hernia 07/10/2011   History of hernia surgery twice prior   Morbid obesity (HCC)    Morbid obesity with  BMI of 50.0-59.9, adult (HCC) 01/04/2016   After [place] [facility] doctor is setting him up with a wellness clinic to help with weight losS  He reports not having tried anything for weight loss either diet or or medicine or surgery in the past  We failed to get [facility] 10/2022      Wt Readings from Last 10 Encounters:  11/13/22  (!) 384 lb 12.8 oz (174.5 kg)  10/13/22  (!) 385 lb (174.6 kg)  09/30/22  (!) 384 lb (174.2 kg)  09/24/22  (!) 382 lb (1   Nausea and vomiting 11/25/2022   OSA on CPAP    Paroxysmal atrial flutter (HCC) 02/12/2016   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   LIMA to LAD   Sinus bradycardia    Thrombocytopenia (HCC)     Past Surgical History:  Procedure Laterality Date   APPENDECTOMY     BIOPSY  02/19/2021   Procedure: BIOPSY;  Surgeon: Tressia Danas, MD;  Location: [facility];  Service: Gastroenterology;;   BIOPSY  11/25/2022   Procedure: BIOPSY;  Surgeon: Tressia Danas, MD;  Location: [facility];  Service: Gastroenterology;;   CARDIAC CATHETERIZATION N/A 12/24/2015   Procedure: Right/Left Heart Cath and Coronary Angiography;  Surgeon: Dolores Patty, MD;  Location: [facility] INVASIVE CV LAB;  Service: Cardiovascular;  Laterality: N/A;   CARDIOVERSION N/A 02/14/2016   Procedure: CARDIOVERSION;  Surgeon: Chrystie Nose, MD;  Location: [facility] ENDOSCOPY;  Service: Cardiovascular;  Laterality: N/A;   COLONOSCOPY WITH PROPOFOL N/A 02/19/2021   Procedure: COLONOSCOPY WITH PROPOFOL;  Surgeon: Tressia Danas, MD;  Location: [facility];  Service: Gastroenterology;  Laterality: N/A;   CORONARY ARTERY BYPASS GRAFT N/A 12/26/2015   Procedure: Off Pump Coronary Artery Bypass Grafting times one using left internal mammary artery;  Surgeon: Purcell Nails, MD;  Location: [facility] OR;  Service: Open Heart Surgery;  Laterality: N/A;   ESOPHAGOGASTRODUODENOSCOPY (EGD) WITH PROPOFOL N/A 02/19/2021   Procedure: ESOPHAGOGASTRODUODENOSCOPY (EGD) WITH PROPOFOL;  Surgeon: Tressia Danas, MD;   Location: [facility];  Service: Gastroenterology;  Laterality: N/A;   ESOPHAGOGASTRODUODENOSCOPY (EGD) WITH PROPOFOL N/A 11/25/2022   Procedure: ESOPHAGOGASTRODUODENOSCOPY (EGD) WITH PROPOFOL;  Surgeon: Tressia Danas, MD;  Location: [facility];  Service: Gastroenterology;  Laterality: N/A;   GALLBLADDER SURGERY     HERNIA REPAIR     IR TRANSCATHETER BX  06/26/2023   IR  US GUIDE VASC ACCESS RIGHT  06/26/2023   IR VENOGRAM HEPATIC W HEMODYNAMIC EVALUATION  06/26/2023   LEFT HEART CATH AND CORS/GRAFTS ANGIOGRAPHY N/A 04/15/2017   Procedure: Left Heart Cath and Cors/Grafts Angiography;  Surgeon: Lennette Bihari, MD;  Location: [facility] INVASIVE CV LAB;  Service: Cardiovascular;  Laterality: N/A;   LEFT HEART CATH AND CORS/GRAFTS ANGIOGRAPHY N/A 11/02/2020   Procedure: LEFT HEART CATH AND CORS/GRAFTS ANGIOGRAPHY;  Surgeon: Tonny Bollman, MD;  Location: [facility] INVASIVE CV LAB;  Service: Cardiovascular;  Laterality: N/A;   POLYPECTOMY  02/19/2021   Procedure: POLYPECTOMY;  Surgeon: Tressia Danas, MD;  Location: [facility];  Service: Gastroenterology;;   TEE WITHOUT CARDIOVERSION N/A 12/26/2015   Procedure: TRANSESOPHAGEAL ECHOCARDIOGRAM (TEE);  Surgeon: Purcell Nails, MD;  Location: [facility] OR;  Service: Open Heart Surgery;  Laterality: N/A;   TEE WITHOUT CARDIOVERSION N/A 02/14/2016   Procedure: TRANSESOPHAGEAL ECHOCARDIOGRAM (TEE);  Surgeon: Chrystie Nose, MD;  Location: [facility] ENDOSCOPY;  Service: Cardiovascular;  Laterality: N/A;    Family History  Problem Relation Age of Onset   CAD Father        4 stents ~ 54   Diabetes Father    Diabetes Maternal Grandfather    Diabetes Maternal Uncle    Heart attack Other    Hyperlipidemia Other     Prior to Admission medications   Medication Sig Start Date End Date Taking? Authorizing Provider  acetaminophen (TYLENOL) 500 MG tablet Take 1,500 mg by mouth once as needed for moderate pain (pain score 4-6), fever, headache or mild pain (pain score 1-3).    Yes [provider]  allopurinol (ZYLOPRIM) 100 MG tablet TAKE 1 TABLET(100 MG) BY MOUTH DAILY Patient taking differently: Take 100 mg by mouth daily. 10/27/23  Yes Lula Olszewski, MD  amitriptyline (ELAVIL) 25 MG tablet Take 1 tablet (25 mg total) by mouth at bedtime. Patient taking differently: Take 25 mg by mouth at bedtime. 09/14/23  Yes Arnaldo Natal, NP  apixaban (ELIQUIS) 5 MG TABS tablet Take 1 tablet (5 mg total) by mouth 2 (two) times daily. 07/28/23  Yes Chandrasekhar, Mahesh A, MD  Cholecalciferol (VITAMIN D3) 125 MCG (5000 UT) CAPS Take 1 capsule (5,000 Units total) by mouth daily at 12 noon. 07/05/23  Yes Hudnell, Judeth Cornfield, NP  Cyanocobalamin (VITAMIN B-12 PO) Place 1 tablet under the tongue daily.   Yes [provider]  diltiazem (CARDIZEM SR) 120 MG 12 hr capsule Take 1 capsule (120 mg total) by mouth 2 (two) times daily. 08/04/23  Yes Lula Olszewski, MD  Evolocumab (REPATHA SURECLICK) 140 MG/ML SOAJ INJECT THE CONTENTS OF 1 SYRINGE UNDER THE SKIN EVERY 14 DAYS Patient taking differently: Inject 140 mg into the skin every 14 (fourteen) days. INJECT THE CONTENTS OF 1 SYRINGE UNDER THE SKIN EVERY 14 DAYS Next dose is due 11/06/2023. 12/17/22  Yes Pemberton, Kathlynn Grate, MD  famotidine (PEPCID) 20 MG tablet Take 1 tablet (20 mg total) by mouth 2 (two) times daily. 10/28/23  Yes Arnaldo Natal, NP  furosemide (LASIX) 40 MG tablet Take by mouth. 10/22/23  Yes [provider]  JARDIANCE 10 MG TABS tablet TAKE 1 TABLET(10 MG) BY MOUTH DAILY BEFORE BREAKFAST Patient taking differently: Take 10 mg by mouth daily. 10/13/23  Yes Lula Olszewski, MD  losartan (COZAAR) 25 MG tablet Take 1 tablet (25 mg total) by mouth daily. 11/13/22  Yes Lula Olszewski, MD  montelukast (SINGULAIR) 10 MG tablet TAKE 1 TABLET(10 MG) BY MOUTH  DAILY Patient taking differently: Take 10 mg by mouth daily. 09/04/23  Yes Lula Olszewski, MD  nitroGLYCERIN (NITROSTAT) 0.4  MG SL tablet Place 1 tablet (0.4 mg total) under the tongue every 5 (five) minutes as needed for chest pain. 02/10/22  Yes Chandrasekhar, Mahesh A, MD  pantoprazole (PROTONIX) 40 MG tablet TAKE 1 TABLET(40 MG) BY MOUTH TWICE DAILY Patient taking differently: Take 40 mg by mouth 2 (two) times daily. 08/18/23  Yes Lula Olszewski, MD  sucralfate (CARAFATE) 1 g tablet TAKE 1 TABLET(1 GRAM) BY MOUTH TWICE DAILY. MAY INCREASE TO 4 TIMES DAILY FOR ANY ACUTE SYMPTOMS Patient taking differently: Take 1 tablet by mouth at bedtime. 11/26/22  Yes Tressia Danas, MD  triamcinolone (NASACORT) 55 MCG/ACT AERO nasal inhaler Place 1 spray into the nose daily. Start with 1 spray each side twice a day for 3 days, then reduce to daily. 10/08/23  Yes Dulce Sellar, NP  Semaglutide,0.25 or 0.5MG /DOS, (OZEMPIC, 0.25 OR 0.5 MG/DOSE,) 2 MG/3ML SOPN Inject 0.25 mg into the skin once a week. Patient taking differently: Inject 0.25 mg into the skin once a week. Waiting for Dr to authorize medication per Insurance, per Patient 11/02/2023. 11/02/23   Lula Olszewski, MD  tirzepatide [facility]) 2.5 MG/0.5ML Pen Inject 2.5 mg into the skin once a week. Patient taking differently: Inject 2.5 mg into the skin once a week. Waiting for Dr to get authorization for Insurance per Patient 11/02/2023. 11/02/23   Lula Olszewski, MD    Current Facility-Administered Medications  Medication Dose Route Frequency Provider Last Rate Last Admin   0.9 %  sodium chloride infusion   Intravenous Continuous Darlin Drop, DO 50 mL/hr at 11/02/23 2236 Rate Change at 11/02/23 2236   acetaminophen (TYLENOL) tablet 650 mg  650 mg Oral Q6H PRN Darlin Drop, DO       amitriptyline (ELAVIL) tablet 25 mg  25 mg Oral QHS Dow Adolph N, DO   25 mg at 11/02/23 2223   cyanocobalamin (VITAMIN B12) tablet 1,000 mcg  1,000 mcg Oral Daily Dow Adolph N, DO       famotidine (PEPCID) IVPB 20 mg premix  20 mg Intravenous Daily Darlin Drop, DO   Stopped  at 11/02/23 2255   HYDROmorphone (DILAUDID) injection 0.5 mg  0.5 mg Intravenous Q4H PRN Dow Adolph N, DO       insulin aspart (novoLOG) injection 0-5 Units  0-5 Units Subcutaneous QHS Hall, Carole N, DO       insulin aspart (novoLOG) injection 0-9 Units  0-9 Units Subcutaneous TID WC Hall, Carole N, DO       melatonin tablet 5 mg  5 mg Oral QHS PRN Hall, Carole N, DO       montelukast (SINGULAIR) tablet 10 mg  10 mg Oral Daily Hall, Carole N, DO       oxyCODONE (Oxy IR/ROXICODONE) immediate release tablet 5 mg  5 mg Oral Q6H PRN Dow Adolph N, DO       polyethylene glycol (MIRALAX / GLYCOLAX) packet 17 g  17 g Oral Daily PRN Dow Adolph N, DO       prochlorperazine (COMPAZINE) injection 5 mg  5 mg Intravenous Q6H PRN Hall, Carole N, DO       sucralfate (CARAFATE) tablet 1 g  1 g Oral QHS Hall, Carole N, DO   1 g at 11/02/23 2223   Current Outpatient Medications  Medication Sig Dispense Refill   acetaminophen (TYLENOL) 500 MG tablet  Take 1,500 mg by mouth once as needed for moderate pain (pain score 4-6), fever, headache or mild pain (pain score 1-3).     allopurinol (ZYLOPRIM) 100 MG tablet TAKE 1 TABLET(100 MG) BY MOUTH DAILY (Patient taking differently: Take 100 mg by mouth daily.) 90 tablet 1   amitriptyline (ELAVIL) 25 MG tablet Take 1 tablet (25 mg total) by mouth at bedtime. (Patient taking differently: Take 25 mg by mouth at bedtime.) 30 tablet 5   apixaban (ELIQUIS) 5 MG TABS tablet Take 1 tablet (5 mg total) by mouth 2 (two) times daily. 180 tablet 1   Cholecalciferol (VITAMIN D3) 125 MCG (5000 UT) CAPS Take 1 capsule (5,000 Units total) by mouth daily at 12 noon. 90 capsule 1   Cyanocobalamin (VITAMIN B-12 PO) Place 1 tablet under the tongue daily.     diltiazem (CARDIZEM SR) 120 MG 12 hr capsule Take 1 capsule (120 mg total) by mouth 2 (two) times daily. 180 capsule 1   Evolocumab (REPATHA SURECLICK) 140 MG/ML SOAJ INJECT THE CONTENTS OF 1 SYRINGE UNDER THE SKIN EVERY 14 DAYS  (Patient taking differently: Inject 140 mg into the skin every 14 (fourteen) days. INJECT THE CONTENTS OF 1 SYRINGE UNDER THE SKIN EVERY 14 DAYS Next dose is due 11/06/2023.) 6 mL 3   famotidine (PEPCID) 20 MG tablet Take 1 tablet (20 mg total) by mouth 2 (two) times daily. 180 tablet 0   furosemide (LASIX) 40 MG tablet Take by mouth.     JARDIANCE 10 MG TABS tablet TAKE 1 TABLET(10 MG) BY MOUTH DAILY BEFORE BREAKFAST (Patient taking differently: Take 10 mg by mouth daily.) 90 tablet 3   losartan (COZAAR) 25 MG tablet Take 1 tablet (25 mg total) by mouth daily. 90 tablet 3   montelukast (SINGULAIR) 10 MG tablet TAKE 1 TABLET(10 MG) BY MOUTH DAILY (Patient taking differently: Take 10 mg by mouth daily.) 90 tablet 1   nitroGLYCERIN (NITROSTAT) 0.4 MG SL tablet Place 1 tablet (0.4 mg total) under the tongue every 5 (five) minutes as needed for chest pain. 25 tablet 6   pantoprazole (PROTONIX) 40 MG tablet TAKE 1 TABLET(40 MG) BY MOUTH TWICE DAILY (Patient taking differently: Take 40 mg by mouth 2 (two) times daily.) 180 tablet 0   sucralfate (CARAFATE) 1 g tablet TAKE 1 TABLET(1 GRAM) BY MOUTH TWICE DAILY. MAY INCREASE TO 4 TIMES DAILY FOR ANY ACUTE SYMPTOMS (Patient taking differently: Take 1 tablet by mouth at bedtime.) 60 tablet 2   triamcinolone (NASACORT) 55 MCG/ACT AERO nasal inhaler Place 1 spray into the nose daily. Start with 1 spray each side twice a day for 3 days, then reduce to daily. 1 each 2   Semaglutide,0.25 or 0.5MG /DOS, (OZEMPIC, 0.25 OR 0.5 MG/DOSE,) 2 MG/3ML SOPN Inject 0.25 mg into the skin once a week. (Patient taking differently: Inject 0.25 mg into the skin once a week. Waiting for Dr to authorize medication per Insurance, per Patient 11/02/2023.) 3 mL 11   tirzepatide (MOUNJARO) 2.5 MG/0.5ML Pen Inject 2.5 mg into the skin once a week. (Patient taking differently: Inject 2.5 mg into the skin once a week. Waiting for Dr to get authorization for Insurance per Patient 11/02/2023.) 2  mL 3   Facility-Administered Medications Ordered in Other Encounters  Medication Dose Route Frequency Provider Last Rate Last Admin   technetium tetrofosmin (TC-MYOVIEW) injection 29.8 millicurie  29.8 millicurie Intravenous Once PRN Chrystie Nose, MD        Allergies as of 11/02/2023 -  Review Complete 11/02/2023  Allergen Reaction Noted   Vibra-tab [doxycycline] Shortness Of Breath 11/15/2021   Glucophage [metformin] Other (See Comments) 06/17/2023   Imdur [isosorbide dinitrate] Diarrhea 04/16/2017   Valtrex [valacyclovir] Itching 12/30/2022   Augmentin [amoxicillin-pot clavulanate] Itching 04/07/2016   Tape Rash 04/14/2017    Social History   Socioeconomic History   Marital status: Divorced    Spouse name: Not on file   Number of children: Not on file   Years of education: Not on file   Highest education level: Not on file  Occupational History   Occupation: [employer] doctor  Tobacco Use   Smoking status: Never   Smokeless tobacco: Never  Vaping Use   Vaping status: Never Used  Substance and Sexual Activity   Alcohol use: Yes    Comment: 6 beers per month   Drug use: No   Sexual activity: Not Currently  Other Topics Concern   Not on file  Social History Narrative   Truck driver -- regional   Lives with mom and dad   Social Determinants of Health   Financial Resource Strain: Not on file  Food Insecurity: No Food Insecurity (06/17/2023)   Hunger Vital Sign    Worried About Running Out of Food in the Last Year: Never true    Ran Out of Food in the Last Year: Never true  Transportation Needs: No Transportation Needs (06/17/2023)   PRAPARE - Administrator, Civil Service (Medical): No    Lack of Transportation (Non-Medical): No  Physical Activity: Not on file  Stress: Not on file  Social Connections: Unknown (08/26/2022)   [facility]   Social Network    Social Network: Not on file  Intimate Partner Violence: Not At Risk  (06/17/2023)   Humiliation, Afraid, Rape, and Kick questionnaire    Fear of Current or Ex-Partner: No    Emotionally Abused: No    Physically Abused: No    Sexually Abused: No     Code Status   Code Status: Full Code  Review of Systems: All systems reviewed and negative except where noted in HPI.  Physical Exam: Vital signs in last 24 hours: Temp:  [97.6 F (36.4 C)-98.8 F (37.1 C)] 98.8 F (37.1 C) (12/03 0622) Pulse Rate:  [63-83] 76 (12/03 0536) Resp:  [14-22] 19 (12/03 0536) BP: (117-151)/(53-83) 129/62 (12/03 0415) SpO2:  [95 %-100 %] 96 % (12/03 0536) FiO2 (%):  [21 %] 21 % (12/03 0040) Weight:  [177.7 kg] 177.7 kg (12/02 1322)    General:  Pleasant obese male in NAD Psych:  Cooperative. Normal mood and affect Eyes: Pupils equal Ears:  Normal auditory acuity Nose: No deformity, discharge or lesions Neck:  Supple, no masses felt Lungs:  Clear to auscultation.  Heart:  Regular rate, regular rhythm.  Abdomen:  Soft, nondistended, nontender, active bowel sounds, no masses felt. Rectal :  Perianal hemorrhoid tags. No masses on DRE. Soft brown stool in vault.  Msk: Symmetrical without gross deformities.  Neurologic:  Alert, oriented, grossly normal neurologically Extremities : No edema Skin:  Intact without significant lesions.    Intake/Output from previous day: 12/02 0701 - 12/03 0700 In: 50 [IV Piggyback:50] Out: -  Intake/Output this shift:  No intake/output data recorded.   Willette Cluster, NP-C   11/03/2023, 8:46 AM

## 2023-11-03 NOTE — ED Notes (Signed)
ED TO INPATIENT HANDOFF REPORT  ED Nurse Name and Phone #: [phone]  S Name/Age/Gender Sean [name] 52 y.o. male Room/Bed: 043C/043C  Code Status   Code Status: Full Code  Home/SNF/Other Home Patient oriented to: self, place, time, and situation Is this baseline? Yes   Triage Complete: Triage complete  Chief Complaint AKI (acute kidney injury) (HCC) [N17.9]  Triage Note Pt c/o bright red blood in stool this morning. Pt c/o SOB, midsternal chest pain, fatiguexmo. Pt takes eliquis.   Allergies Allergies  Allergen Reactions   Vibra-Tab [Doxycycline] Shortness Of Breath   Glucophage [Metformin] Other (See Comments)    Pt told by his nephrologist not to take this medication due to his kidney function.   Imdur [Isosorbide Dinitrate] Diarrhea   Valtrex [Valacyclovir] Itching   Augmentin [Amoxicillin-Pot Clavulanate] Itching   Tape Rash    Level of Care/Admitting Diagnosis ED Disposition     ED Disposition  Admit   Condition  --   Comment  Hospital [facility] [[id number]]  Level of Care: Progressive [102]  Admit to Progressive based on following criteria: MULTISYSTEM THREATS such as stable sepsis, metabolic/electrolyte imbalance with or without encephalopathy that is responding to early treatment.  Admit to Progressive based on following criteria: NEPHROLOGY stable condition requiring close monitoring for AKI, requiring Hemodialysis or Peritoneal Dialysis either from expected electrolyte imbalance, acidosis, or fluid overload that can be managed by NIPPV or high flow oxygen.  Admit to Progressive based on following criteria: GI, ENDOCRINE disease patients with GI bleeding, acute liver failure or pancreatitis, stable with diabetic ketoacidosis or thyrotoxicosis (hypothyroid) state.  May admit patient to [facility] or [facility] if equivalent level of care is available:: No  Covid Evaluation: Asymptomatic - no recent exposure (last 10 days)  testing not required  Diagnosis: AKI (acute kidney injury) [facility]) [[id number]]  Admitting Physician: Darlin Drop [[id number]]  Attending Physician: Darlin Drop [[id number]]  Certification:: I certify this patient will need inpatient services for at least 2 midnights  Expected Medical Readiness: 11/04/2023          B Medical/Surgery History Past Medical History:  Diagnosis Date   Abnormal liver enzymes    a. Sees a doctor in [place].   Cardiac cirrhosis    a. possible elevated LFTs/low platelets felt due to cardiac cirrhosis per 2017 admission.   Chronic combined systolic and diastolic CHF (congestive heart failure) (HCC)    CKD (chronic kidney disease) stage 3, GFR 30-59 ml/min (HCC) 01/04/2016   Lab Results  Component  Value  Date/Time     GFR  29.24 (L)  09/30/2022 02:51 PM     GFR  27.27 (L)  09/24/2022 03:40 PM     GFR  24.10 (L)  09/03/2022 03:45 PM     GFR  18.98 (L)  08/25/2022 12:21 PM     GFR  38.98 (L)  04/24/2022 02:39 PM         CKD (chronic kidney disease), stage II    Stage 4   Congenital heart defect    a. rightward rotation of heart, almost dextrocardia   Coronary artery disease    a. s/p CABGx1 in 12/2015.   Gastric polyps 11/25/2022   Gastritis and gastroduodenitis 11/25/2022          Gastroesophageal reflux disease with esophagitis and hemorrhage 04/04/2021   Hematuria    a. Chronic hx of this, no prior etiology determined through workup per patient.   History of umbilical  hernia repair 07/10/2011   History of hernia surgery twice prior   Hypercholesteremia    a. Prev taken off statin due to abnormal liver function.   Hypertension    Incisional hernia 07/10/2011   History of hernia surgery twice prior   Morbid obesity (HCC)    Morbid obesity with BMI of 50.0-59.9, adult (HCC) 01/04/2016   After [place] [facility] doctor is setting him up with a wellness clinic to help with weight losS  He reports not having tried anything for weight loss either diet or or medicine or  surgery in the past  We failed to get [facility] 10/2022      Wt Readings from Last 10 Encounters:  11/13/22  (!) 384 lb 12.8 oz (174.5 kg)  10/13/22  (!) 385 lb (174.6 kg)  09/30/22  (!) 384 lb (174.2 kg)  09/24/22  (!) 382 lb (1   Nausea and vomiting 11/25/2022   OSA on CPAP    Paroxysmal atrial flutter (HCC) 02/12/2016   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   LIMA to LAD   Sinus bradycardia    Thrombocytopenia (HCC)    Past Surgical History:  Procedure Laterality Date   APPENDECTOMY     BIOPSY  02/19/2021   Procedure: BIOPSY;  Surgeon: Tressia Danas, MD;  Location: [facility];  Service: Gastroenterology;;   BIOPSY  11/25/2022   Procedure: BIOPSY;  Surgeon: Tressia Danas, MD;  Location: [facility] ENDOSCOPY;  Service: Gastroenterology;;   CARDIAC CATHETERIZATION N/A 12/24/2015   Procedure: Right/Left Heart Cath and Coronary Angiography;  Surgeon: Dolores Patty, MD;  Location: [facility] INVASIVE CV LAB;  Service: Cardiovascular;  Laterality: N/A;   CARDIOVERSION N/A 02/14/2016   Procedure: CARDIOVERSION;  Surgeon: Chrystie Nose, MD;  Location: [facility] ENDOSCOPY;  Service: Cardiovascular;  Laterality: N/A;   COLONOSCOPY WITH PROPOFOL N/A 02/19/2021   Procedure: COLONOSCOPY WITH PROPOFOL;  Surgeon: Tressia Danas, MD;  Location: [facility] ENDOSCOPY;  Service: Gastroenterology;  Laterality: N/A;   CORONARY ARTERY BYPASS GRAFT N/A 12/26/2015   Procedure: Off Pump Coronary Artery Bypass Grafting times one using left internal mammary artery;  Surgeon: Purcell Nails, MD;  Location: [facility] OR;  Service: Open Heart Surgery;  Laterality: N/A;   ESOPHAGOGASTRODUODENOSCOPY (EGD) WITH PROPOFOL N/A 02/19/2021   Procedure: ESOPHAGOGASTRODUODENOSCOPY (EGD) WITH PROPOFOL;  Surgeon: Tressia Danas, MD;  Location: [facility] ENDOSCOPY;  Service: Gastroenterology;  Laterality: N/A;   ESOPHAGOGASTRODUODENOSCOPY (EGD) WITH PROPOFOL N/A 11/25/2022   Procedure: ESOPHAGOGASTRODUODENOSCOPY (EGD) WITH PROPOFOL;  Surgeon: Tressia Danas,  MD;  Location: [facility] ENDOSCOPY;  Service: Gastroenterology;  Laterality: N/A;   GALLBLADDER SURGERY     HERNIA REPAIR     IR TRANSCATHETER BX  06/26/2023   IR US GUIDE VASC ACCESS RIGHT  06/26/2023   IR VENOGRAM HEPATIC W HEMODYNAMIC EVALUATION  06/26/2023   LEFT HEART CATH AND CORS/GRAFTS ANGIOGRAPHY N/A 04/15/2017   Procedure: Left Heart Cath and Cors/Grafts Angiography;  Surgeon: Lennette Bihari, MD;  Location: [facility] INVASIVE CV LAB;  Service: Cardiovascular;  Laterality: N/A;   LEFT HEART CATH AND CORS/GRAFTS ANGIOGRAPHY N/A 11/02/2020   Procedure: LEFT HEART CATH AND CORS/GRAFTS ANGIOGRAPHY;  Surgeon: Tonny Bollman, MD;  Location: [facility] INVASIVE CV LAB;  Service: Cardiovascular;  Laterality: N/A;   POLYPECTOMY  02/19/2021   Procedure: POLYPECTOMY;  Surgeon: Tressia Danas, MD;  Location: [facility] ENDOSCOPY;  Service: Gastroenterology;;   TEE WITHOUT CARDIOVERSION N/A 12/26/2015   Procedure: TRANSESOPHAGEAL ECHOCARDIOGRAM (TEE);  Surgeon: Purcell Nails, MD;  Location: [facility] OR;  Service: Open Heart Surgery;  Laterality: N/A;   TEE WITHOUT CARDIOVERSION N/A 02/14/2016   Procedure: TRANSESOPHAGEAL ECHOCARDIOGRAM (TEE);  Surgeon: Chrystie Nose, MD;  Location: [facility] ENDOSCOPY;  Service: Cardiovascular;  Laterality: N/A;     A IV Location/Drains/Wounds Patient Lines/Drains/Airways Status     Active Line/Drains/Airways     Name Placement date Placement time Site Days   Peripheral IV 11/02/23 20 G 1" Left Forearm 11/02/23  2054  Forearm  1            Intake/Output Last 24 hours  Intake/Output Summary (Last 24 hours) at 11/03/2023 1451 Last data filed at 11/02/2023 2255 Gross per 24 hour  Intake 50 ml  Output --  Net 50 ml    Labs/Imaging Results for orders placed or performed during the hospital encounter of 11/02/23 (from the past 48 hour(s))  Type and screen [facility]     Status: None   Collection Time: 11/02/23  1:30 PM  Result Value Ref Range   ABO/RH(D) A POS     Antibody Screen NEG    Sample Expiration      11/05/2023,2359 Performed at [facility] Lab, [address] [place], [place] [address]   Comprehensive metabolic panel     Status: Abnormal   Collection Time: 11/02/23  1:36 PM  Result Value Ref Range   Sodium 140 135 - 145 mmol/L   Potassium 3.9 3.5 - 5.1 mmol/L   Chloride 106 98 - 111 mmol/L   CO2 25 22 - 32 mmol/L   Glucose, Bld 170 (H) 70 - 99 mg/dL    Comment: Glucose reference range applies only to samples taken after fasting for at least 8 hours.   BUN 70 (H) 6 - 20 mg/dL   Creatinine, Ser 0.86 (H) 0.61 - 1.24 mg/dL   Calcium 9.1 8.9 - 57.8 mg/dL   Total Protein 7.1 6.5 - 8.1 g/dL   Albumin 2.8 (L) 3.5 - 5.0 g/dL   AST 57 (H) 15 - 41 U/L   ALT 71 (H) 0 - 44 U/L   Alkaline Phosphatase 188 (H) 38 - 126 U/L   Total Bilirubin 0.7 <1.2 mg/dL   GFR, Estimated 12 (L) >60 mL/min    Comment: (NOTE) Calculated using the CKD-EPI Creatinine Equation (2021)    Anion gap 9 5 - 15    Comment: Performed at [facility] Lab, [address], [place], [place] [address]  CBC     Status: Abnormal   Collection Time: 11/02/23  1:36 PM  Result Value Ref Range   WBC 5.3 4.0 - 10.5 K/uL   RBC 3.49 (L) 4.22 - 5.81 MIL/uL   Hemoglobin 11.3 (L) 13.0 - 17.0 g/dL   HCT 95.2 (L) 84.1 - 32.4 %   MCV 102.6 (H) 80.0 - 100.0 fL   MCH 32.4 26.0 - 34.0 pg   MCHC 31.6 30.0 - 36.0 g/dL   RDW 40.1 02.7 - 25.3 %   Platelets 54 (L) 150 - 400 K/uL    Comment: Immature Platelet Fraction may be clinically indicated, consider ordering this additional test [id number] REPEATED TO VERIFY    nRBC 0.0 0.0 - 0.2 %    Comment: Performed at [facility] Lab, [address] [place], [place] [address]  Troponin I (High Sensitivity)     Status: None   Collection Time: 11/02/23  1:36 PM  Result Value Ref Range   Troponin I (High Sensitivity) 3 <18 ng/L    Comment: (NOTE) Elevated high sensitivity  troponin I (hsTnI) values and significant  changes across serial  measurements may suggest ACS but many other  chronic and acute conditions are known to elevate hsTnI results.  Refer to the "Links" section for chest pain algorithms and additional  guidance. Performed at [facility], [address], [place], [place] [address]   Brain natriuretic peptide     Status: None   Collection Time: 11/02/23  1:36 PM  Result Value Ref Range   B Natriuretic Peptide 25.4 0.0 - 100.0 pg/mL    Comment: Performed at [facility] Lab, [address], [place], [place] [address]  POC occult blood, ED     Status: None   Collection Time: 11/02/23  4:06 PM  Result Value Ref Range   Fecal Occult Bld NEGATIVE NEGATIVE  Hemoglobin and hematocrit, blood     Status: Abnormal   Collection Time: 11/02/23  8:58 PM  Result Value Ref Range   Hemoglobin 11.6 (L) 13.0 - 17.0 g/dL   HCT 82.9 (L) 56.2 - 13.0 %    Comment: Performed at [facility], [address], [place], [place] [address]  CBG monitoring, ED     Status: Abnormal   Collection Time: 11/02/23 10:26 PM  Result Value Ref Range   Glucose-Capillary 123 (H) 70 - 99 mg/dL    Comment: Glucose reference range applies only to samples taken after fasting for at least 8 hours.  CBC     Status: Abnormal   Collection Time: 11/03/23  3:24 AM  Result Value Ref Range   WBC 5.1 4.0 - 10.5 K/uL   RBC 3.37 (L) 4.22 - 5.81 MIL/uL   Hemoglobin 11.3 (L) 13.0 - 17.0 g/dL   HCT 46.9 (L) 62.9 - 52.8 %   MCV 104.7 (H) 80.0 - 100.0 fL   MCH 33.5 26.0 - 34.0 pg   MCHC 32.0 30.0 - 36.0 g/dL   RDW 41.3 24.4 - 01.0 %   Platelets 56 (L) 150 - 400 K/uL    Comment: Immature Platelet Fraction may be clinically indicated, consider ordering this additional test [id number] REPEATED TO VERIFY    nRBC 0.0 0.0 - 0.2 %    Comment: Performed at [facility] Lab, [address] [place], [place] [address]  Comprehensive metabolic panel     Status: Abnormal   Collection Time: 11/03/23  3:24 AM  Result Value Ref Range   Sodium 143  135 - 145 mmol/L   Potassium 3.7 3.5 - 5.1 mmol/L   Chloride 111 98 - 111 mmol/L   CO2 23 22 - 32 mmol/L   Glucose, Bld 140 (H) 70 - 99 mg/dL    Comment: Glucose reference range applies only to samples taken after fasting for at least 8 hours.   BUN 65 (H) 6 - 20 mg/dL   Creatinine, Ser 4.25 (H) 0.61 - 1.24 mg/dL   Calcium 8.7 (L) 8.9 - 10.3 mg/dL   Total Protein 6.3 (L) 6.5 - 8.1 g/dL   Albumin 2.6 (L) 3.5 - 5.0 g/dL   AST 53 (H) 15 - 41 U/L   ALT 71 (H) 0 - 44 U/L   Alkaline Phosphatase 185 (H) 38 - 126 U/L   Total Bilirubin 0.6 <1.2 mg/dL   GFR, Estimated 12 (L) >60 mL/min    Comment: (NOTE) Calculated using the CKD-EPI Creatinine Equation (2021)    Anion gap 9 5 - 15    Comment: Performed at [facility] Lab, [address], [place], [place] [address]  Magnesium     Status: None   Collection Time: 11/03/23  3:24 AM  Result Value Ref Range   Magnesium 2.3 1.7 - 2.4 mg/dL    Comment: Performed at [facility] Lab, [address], [place], [place] [address]  Phosphorus     Status: Abnormal   Collection Time: 11/03/23  3:24 AM  Result Value Ref Range   Phosphorus 4.9 (H) 2.5 - 4.6 mg/dL    Comment: Performed at [facility] Lab, [address] [place], [place] [address]  CBG monitoring, ED     Status: Abnormal   Collection Time: 11/03/23  8:22 AM  Result Value Ref Range   Glucose-Capillary 111 (H) 70 - 99 mg/dL    Comment: Glucose reference range applies only to samples taken after fasting for at least 8 hours.  CBG monitoring, ED     Status: None   Collection Time: 11/03/23 12:03 PM  Result Value Ref Range   Glucose-Capillary 98 70 - 99 mg/dL    Comment: Glucose reference range applies only to samples taken after fasting for at least 8 hours.   CT ABDOMEN PELVIS WO CONTRAST  Result Date: 11/02/2023 CLINICAL DATA:  Acute nonlocalized abdominal pain. Right red blood in stool this morning. EXAM: CT ABDOMEN AND PELVIS WITHOUT CONTRAST TECHNIQUE: Multidetector CT  imaging of the abdomen and pelvis was performed following the standard protocol without IV contrast. RADIATION DOSE REDUCTION: This exam was performed according to the departmental dose-optimization program which includes automated exposure control, adjustment of the mA and/or kV according to patient size and/or use of iterative reconstruction technique. COMPARISON:  01/26/2023 FINDINGS: Lower chest: Lung bases are clear. Postoperative changes in the mediastinum. Elevation of the left hemidiaphragm is unchanged. Hepatobiliary: No focal liver abnormality is seen. Status post cholecystectomy. No biliary dilatation. Pancreas: Unremarkable. No pancreatic ductal dilatation or surrounding inflammatory changes. Spleen: Normal in size without focal abnormality. Adrenals/Urinary Tract: Adrenal glands are unremarkable. Kidneys are normal, without renal calculi, focal lesion, or hydronephrosis. Bladder is unremarkable. Stomach/Bowel: Stomach, small bowel, and colon are not abnormally distended. Scattered stool in the colon. No wall thickening or inflammatory changes. Appendix is not identified. Vascular/Lymphatic: Aortic atherosclerosis. No enlarged abdominal or pelvic lymph nodes. Reproductive: Prostate gland is atrophic or surgically absent. No pelvic mass or lymphadenopathy. Other: No free air or free fluid in the abdomen. Small periumbilical hernia containing fat. Cystic structure in the left periumbilical space measuring 4 cm diameter. This is likely a sebaceous cyst. No change since previous study. Musculoskeletal: Degenerative changes in the spine. 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 IMPRESSION: 1. No acute process demonstrated in the abdomen or pelvis. No significant change since previous study. 2. Chronic elevation of the left hemidiaphragm. 3. Mild aortic atherosclerosis. 4. Cystic structure adjacent to the umbilicus likely representing a sebaceous cyst. No change. Electronically Signed   By: Burman Nieves M.D.   On: 11/02/2023 20:15   DG Chest Portable 1 View  Result Date: 11/02/2023 CLINICAL DATA:  Shortness of breath. Bright red blood in stool this morning. Midsternal chest pain. Fatigue. EXAM: PORTABLE CHEST 1 VIEW COMPARISON:  01/26/2023 FINDINGS: Postoperative changes in the mediastinum with sternotomy wires present. Elevation of the left hemidiaphragm is unchanged since prior study. Lungs are clear. No pleural effusions. No pneumothorax. Mediastinal contours appear intact. IMPRESSION: Elevation of left hemidiaphragm is unchanged.  Lungs are clear. Electronically Signed   By: Burman Nieves M.D.   On: 11/02/2023 20:10  Pending Labs Unresulted Labs (From admission, onward)     Start     Ordered   11/04/23 0500  Basic metabolic panel  Daily,   R      11/03/23 1012   11/04/23 0500  CBC  Daily,   R      11/03/23 1012   11/04/23 0500  Magnesium  Daily,   R      11/03/23 1012            Vitals/Pain Today's Vitals   11/03/23 0622 11/03/23 0813 11/03/23 1105 11/03/23 1218  BP:   (!) 145/66   Pulse:   76   Resp:   (!) 22   Temp: 98.8 F (37.1 C)  98 F (36.7 C)   TempSrc: Oral  Oral   SpO2:   98%   Weight:      Height:      PainSc:  0-No pain  4     Isolation Precautions No active isolations  Medications Medications  acetaminophen (TYLENOL) tablet 650 mg (650 mg Oral Given 11/03/23 1217)  prochlorperazine (COMPAZINE) injection 5 mg (has no administration in time range)  melatonin tablet 5 mg (has no administration in time range)  polyethylene glycol (MIRALAX / GLYCOLAX) packet 17 g (has no administration in time range)  famotidine (PEPCID) IVPB 20 mg premix (0 mg Intravenous Stopped 11/03/23 1221)  amitriptyline (ELAVIL) tablet 25 mg (25 mg Oral Given 11/02/23 2223)  cyanocobalamin (VITAMIN B12) tablet 1,000 mcg (1,000 mcg Oral Given 11/03/23 1141)  montelukast (SINGULAIR) tablet 10 mg (10 mg Oral Given 11/03/23 1141)  sucralfate (CARAFATE) tablet 1 g (1 g Oral  Given 11/02/23 2223)  insulin aspart (novoLOG) injection 0-9 Units ( Subcutaneous Not Given 11/03/23 1203)  insulin aspart (novoLOG) injection 0-5 Units ( Subcutaneous Not Given 11/02/23 2227)  oxyCODONE (Oxy IR/ROXICODONE) immediate release tablet 5 mg (has no administration in time range)  HYDROmorphone (DILAUDID) injection 0.5 mg (has no administration in time range)  glucagon (human recombinant) (GLUCAGEN) injection 1 mg (has no administration in time range)  hydrALAZINE (APRESOLINE) injection 10 mg (has no administration in time range)  metoprolol tartrate (LOPRESSOR) injection 5 mg (has no administration in time range)  ipratropium-albuterol (DUONEB) 0.5-2.5 (3) MG/3ML nebulizer solution 3 mL (has no administration in time range)  senna-docusate (Senokot-S) tablet 1 tablet (has no administration in time range)  guaiFENesin (ROBITUSSIN) 100 MG/5ML liquid 5 mL (has no administration in time range)  0.9 %  sodium chloride infusion ( Intravenous Restarted 11/03/23 1153)  hydrocortisone (ANUSOL-HC) suppository 25 mg (25 mg Rectal Given 11/03/23 1441)  nystatin (MYCOSTATIN/NYSTOP) topical powder (has no administration in time range)    Mobility walks     Focused Assessments GI and Renal function   R Recommendations: See Admitting Provider Note  Report given to:   Additional Notes: elevated creatinine and Bun. Uses Cpap at night, have excoriation under the skin foles, MD order Nystatin.

## 2023-11-03 NOTE — Telephone Encounter (Signed)
Pharmacy Patient Advocate Encounter  Received notification from [facility] that Prior Authorization for  Zepbound 2.5mg /0.42ml  has been DENIED.  Full denial letter will be uploaded to the media tab. See denial reason below.   PA #/Case ID/Reference #: [id number]   DENIAL REASON: Plan exclusion

## 2023-11-03 NOTE — Consult Note (Signed)
[facility] Nephrology Consultation Note  Requesting MD:  Reason for consult:   HPI:  Sean [name] is a 52 y.o. male with history of HFpEF, CKD 4, paroxysmal A-fib on Eliquis, CAD s/p CABG, hypertension, hyperlipidemia, gout, OSA, depression, thrombocytopenia, upper GI bleed, hypothyroidism, obesity, nonalcoholic steatohepatitis presenting due to GI bleeding and epigastric pain.  Consulted for AKI  Found to have elevated creatinine to 5.63 from baseline of 3.2 as well as anemia.  Started on IV normal saline.  Noncontrast CT abdomen/pelvis showed normal kidney.  Notably patient was recently being diuresed with Lasix 40mg  daily due to lower extremity swelling  Today, reports feeling well overall. Discussed recent diuretic use - was on Lasix for about 2 weeks due to swelling. After taking this he has lost a lot of water weight. Still has some swelling but not as much as before. Denies SOB. He reports the plan was to reduce dosing after 2 weeks of daily lasix, down to 3 times per week. Produces urine.    Creatinine  Date/Time Value Ref Range Status  07/27/2023 12:00 AM 3.2 (A) 0.6 - 1.3 Final  04/10/2023 12:00 AM 3.0 (A) 0.6 - 1.3 Final  04/03/2023 04:09 PM 3.02 (H) 0.61 - 1.24 mg/dL Final  60/45/4098 11:91 PM 2.33 (H) 0.61 - 1.24 mg/dL Final  47/82/9562 13:08 AM 2.4 (A) 0.6 - 1.3 Final  03/24/2022 12:00 AM 2.1 (A) 0.6 - 1.3 Final   Creat  Date/Time Value Ref Range Status  12/13/2021 06:08 PM 1.95 (H) 0.60 - 1.29 mg/dL Final  65/78/4696 29:52 PM 2.15 (H) 0.60 - 1.29 mg/dL Final  84/13/2440 10:27 AM 1.44 (H) 0.60 - 1.35 mg/dL Final  25/36/6440 34:74 AM 1.31 0.60 - 1.35 mg/dL Final   Creatinine, Ser  Date/Time Value Ref Range Status  11/03/2023 03:24 AM 5.44 (H) 0.61 - 1.24 mg/dL Final  25/95/6387 56:43 PM 5.63 (H) 0.61 - 1.24 mg/dL Final  32/95/1884 16:60 AM 3.81 (H) 0.61 - 1.24 mg/dL Final  63/12/6008 93:23 AM 3.76 (H) 0.61 - 1.24 mg/dL Final  55/73/2202 54:27  PM 3.35 (H) 0.61 - 1.24 mg/dL Final  05/24/7627 31:51 PM 2.51 (H) 0.40 - 1.50 mg/dL Final  76/16/0737 10:62 PM 2.66 (H) 0.40 - 1.50 mg/dL Final  69/48/5462 70:35 PM 2.95 (H) 0.40 - 1.50 mg/dL Final  00/93/8182 99:37 PM 3.60 (H) 0.40 - 1.50 mg/dL Final  16/96/7893 81:01 PM 1.98 (H) 0.40 - 1.50 mg/dL Final  75/08/2584 27:78 AM 1.97 (H) 0.40 - 1.50 mg/dL Final  24/23/5361 44:31 PM 1.96 (H) 0.40 - 1.50 mg/dL Final  54/00/8676 19:50 AM 1.80 (H) 0.76 - 1.27 mg/dL Final  93/26/7124 58:09 PM 1.60 (H) 0.40 - 1.50 mg/dL Final  98/33/8250 53:97 AM 1.64 (H) 0.76 - 1.27 mg/dL Final  67/34/1937 90:24 AM 1.67 (H) 0.61 - 1.24 mg/dL Final  09/73/5329 92:42 AM 1.42 (H) 0.76 - 1.27 mg/dL Final  68/34/1962 22:97 PM 1.38 (H) 0.76 - 1.27 mg/dL Final  98/92/1194 17:40 PM 1.68 (H) 0.76 - 1.27 mg/dL Final  81/44/8185 63:14 PM 1.65 (H) 0.76 - 1.27 mg/dL Final  97/01/6377 58:85 PM 1.48 (H) 0.76 - 1.27 mg/dL Final  02/77/4128 78:67 AM 1.43 (H) 0.61 - 1.24 mg/dL Final  67/20/9470 96:28 PM 1.49 (H) 0.76 - 1.27 mg/dL Final  36/62/9476 54:65 AM 1.44 (H) 0.76 - 1.27 mg/dL Final  03/54/6568 12:75 AM 1.36 (H) 0.61 - 1.24 mg/dL Final  17/00/1749 44:96 AM 1.34 (H) 0.61 - 1.24 mg/dL Final  75/91/6384 66:59 AM 1.46 (  H) 0.61 - 1.24 mg/dL Final  25/36/6440 34:74 AM 1.26 (H) 0.61 - 1.24 mg/dL Final  25/95/6387 56:43 PM 1.42 (H) 0.61 - 1.24 mg/dL Final  32/95/1884 16:60 AM 1.38 (H) 0.61 - 1.24 mg/dL Final  63/12/6008 93:23 AM 1.82 (H) 0.61 - 1.24 mg/dL Final  55/73/2202 54:27 AM 1.54 (H) 0.61 - 1.24 mg/dL Final  05/24/7627 31:51 PM 1.40 (H) 0.61 - 1.24 mg/dL Final  76/16/0737 10:62 PM 1.56 (H) 0.61 - 1.24 mg/dL Final  69/48/5462 70:35 AM 1.38 (H) 0.61 - 1.24 mg/dL Final  00/93/8182 99:37 PM 1.25 (H) 0.61 - 1.24 mg/dL Final  16/96/7893 81:01 PM 1.10 0.61 - 1.24 mg/dL Final  75/08/2584 27:78 AM 1.20 0.61 - 1.24 mg/dL Final  24/23/5361 44:31 AM 1.10 0.61 - 1.24 mg/dL Final  54/00/8676 19:50 AM 1.00 0.61 - 1.24 mg/dL Final   93/26/7124 58:09 AM 1.31 (H) 0.61 - 1.24 mg/dL Final  98/33/8250 53:97 AM 1.20 0.61 - 1.24 mg/dL Final  67/34/1937 90:24 AM 1.30 (H) 0.61 - 1.24 mg/dL Final  09/73/5329 92:42 AM 1.47 (H) 0.61 - 1.24 mg/dL Final  68/34/1962 22:97 AM 1.24 0.50 - 1.35 mg/dL Final    Comment:    **Please note change in reference range.**  04/23/2011 09:50 AM 1.07 0.4 - 1.5 mg/dL Final     PMHx:   Past Medical History:  Diagnosis Date   Abnormal liver enzymes    a. Sees a doctor in [place].   Cardiac cirrhosis    a. possible elevated LFTs/low platelets felt due to cardiac cirrhosis per 2017 admission.   Chronic combined systolic and diastolic CHF (congestive heart failure) (HCC)    CKD (chronic kidney disease) stage 3, GFR 30-59 ml/min (HCC) 01/04/2016   Lab Results  Component  Value  Date/Time     GFR  29.24 (L)  09/30/2022 02:51 PM     GFR  27.27 (L)  09/24/2022 03:40 PM     GFR  24.10 (L)  09/03/2022 03:45 PM     GFR  18.98 (L)  08/25/2022 12:21 PM     GFR  38.98 (L)  04/24/2022 02:39 PM         CKD (chronic kidney disease), stage II    Stage 4   Congenital heart defect    a. rightward rotation of heart, almost dextrocardia   Coronary artery disease    a. s/p CABGx1 in 12/2015.   Gastric polyps 11/25/2022   Gastritis and gastroduodenitis 11/25/2022          Gastroesophageal reflux disease with esophagitis and hemorrhage 04/04/2021   Hematuria    a. Chronic hx of this, no prior etiology determined through workup per patient.   History of umbilical hernia repair 07/10/2011   History of hernia surgery twice prior   Hypercholesteremia    a. Prev taken off statin due to abnormal liver function.   Hypertension    Incisional hernia 07/10/2011   History of hernia surgery twice prior   Morbid obesity (HCC)    Morbid obesity with BMI of 50.0-59.9, adult (HCC) 01/04/2016   After [facility] doctor is setting him up with a wellness clinic to help with weight losS  He reports not having tried anything  for weight loss either diet or or medicine or surgery in the past  We failed to get [facility] 10/2022      Wt Readings from Last 10 Encounters:  11/13/22  (!) 384 lb 12.8 oz (174.5 kg)  10/13/22  (!) 385 lb (174.6  kg)  09/30/22  (!) 384 lb (174.2 kg)  09/24/22  (!) 382 lb (1   Nausea and vomiting 11/25/2022   OSA on CPAP    Paroxysmal atrial flutter (HCC) 02/12/2016   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   LIMA to LAD   Sinus bradycardia    Thrombocytopenia (HCC)     Past Surgical History:  Procedure Laterality Date   APPENDECTOMY     BIOPSY  02/19/2021   Procedure: BIOPSY;  Surgeon: Tressia Danas, MD;  Location: [facility];  Service: Gastroenterology;;   BIOPSY  11/25/2022   Procedure: BIOPSY;  Surgeon: Tressia Danas, MD;  Location: [facility];  Service: Gastroenterology;;   CARDIAC CATHETERIZATION N/A 12/24/2015   Procedure: Right/Left Heart Cath and Coronary Angiography;  Surgeon: Dolores Patty, MD;  Location: [facility] INVASIVE CV LAB;  Service: Cardiovascular;  Laterality: N/A;   CARDIOVERSION N/A 02/14/2016   Procedure: CARDIOVERSION;  Surgeon: Chrystie Nose, MD;  Location: [facility] ENDOSCOPY;  Service: Cardiovascular;  Laterality: N/A;   COLONOSCOPY WITH PROPOFOL N/A 02/19/2021   Procedure: COLONOSCOPY WITH PROPOFOL;  Surgeon: Tressia Danas, MD;  Location: [facility];  Service: Gastroenterology;  Laterality: N/A;   CORONARY ARTERY BYPASS GRAFT N/A 12/26/2015   Procedure: Off Pump Coronary Artery Bypass Grafting times one using left internal mammary artery;  Surgeon: Purcell Nails, MD;  Location: [facility] OR;  Service: Open Heart Surgery;  Laterality: N/A;   ESOPHAGOGASTRODUODENOSCOPY (EGD) WITH PROPOFOL N/A 02/19/2021   Procedure: ESOPHAGOGASTRODUODENOSCOPY (EGD) WITH PROPOFOL;  Surgeon: Tressia Danas, MD;  Location: [facility];  Service: Gastroenterology;  Laterality: N/A;   ESOPHAGOGASTRODUODENOSCOPY (EGD) WITH PROPOFOL N/A 11/25/2022   Procedure: ESOPHAGOGASTRODUODENOSCOPY  (EGD) WITH PROPOFOL;  Surgeon: Tressia Danas, MD;  Location: [facility];  Service: Gastroenterology;  Laterality: N/A;   GALLBLADDER SURGERY     HERNIA REPAIR     IR TRANSCATHETER BX  06/26/2023   IR US GUIDE VASC ACCESS RIGHT  06/26/2023   IR VENOGRAM HEPATIC W HEMODYNAMIC EVALUATION  06/26/2023   LEFT HEART CATH AND CORS/GRAFTS ANGIOGRAPHY N/A 04/15/2017   Procedure: Left Heart Cath and Cors/Grafts Angiography;  Surgeon: Lennette Bihari, MD;  Location: [facility] INVASIVE CV LAB;  Service: Cardiovascular;  Laterality: N/A;   LEFT HEART CATH AND CORS/GRAFTS ANGIOGRAPHY N/A 11/02/2020   Procedure: LEFT HEART CATH AND CORS/GRAFTS ANGIOGRAPHY;  Surgeon: Tonny Bollman, MD;  Location[facility] INVAS[facility] CV LAB;  Service: Cardiovascular;  Laterality: N/A;   POLYPECTOMY  02/19/2021   Procedure: POLYPECTOMY;  Surgeon: Tressia Danas, MD;  Location: [facility];  Service: Gastroenterology;;   TEE WITHOUT CARDIOVERSION N/A 12/26/2015   Procedure: TRANSESOPHAGEAL ECHOCARDIOGRAM (TEE);  Surgeon: Purcell Nails, MD;  Location: [facility] OR;  Service: Open Heart Surgery;  Laterality: N/A;   TEE WITHOUT CARDIOVERSION N/A 02/14/2016   Procedure: TRANSESOPHAGEAL ECHOCARDIOGRAM (TEE);  Surgeon: Chrystie Nose, MD;  Location: [facility] ENDOSCOPY;  Service: Cardiovascular;  Laterality: N/A;    Family Hx:  Family History  Problem Relation Age of Onset   CAD Father        4 stents ~ 54   Diabetes Father    Diabetes Maternal Grandfather    Diabetes Maternal Uncle    Heart attack Other    Hyperlipidemia Other     Social History:  reports that he has never smoked. He has never used smokeless tobacco. He reports current alcohol use. He reports that he does not use drugs.  Allergies:  Allergies  Allergen Reactions   Vibra-Tab [Doxycycline] Shortness Of Breath  Glucophage [Metformin] Other (See Comments)    Pt told by his nephrologist not to take this medication due to his kidney function.   Imdur [Isosorbide Dinitrate]  Diarrhea   Valtrex [Valacyclovir] Itching   Augmentin [Amoxicillin-Pot Clavulanate] Itching   Tape Rash    Medications: Prior to Admission medications   Medication Sig Start Date End Date Taking? Authorizing Provider  acetaminophen (TYLENOL) 500 MG tablet Take 1,500 mg by mouth once as needed for moderate pain (pain score 4-6), fever, headache or mild pain (pain score 1-3).   Yes [provider]  allopurinol (ZYLOPRIM) 100 MG tablet TAKE 1 TABLET(100 MG) BY MOUTH DAILY Patient taking differently: Take 100 mg by mouth daily. 10/27/23  Yes Lula Olszewski, MD  amitriptyline (ELAVIL) 25 MG tablet Take 1 tablet (25 mg total) by mouth at bedtime. Patient taking differently: Take 25 mg by mouth at bedtime. 09/14/23  Yes Arnaldo Natal, NP  apixaban (ELIQUIS) 5 MG TABS tablet Take 1 tablet (5 mg total) by mouth 2 (two) times daily. 07/28/23  Yes Chandrasekhar, Mahesh A, MD  Cholecalciferol (VITAMIN D3) 125 MCG (5000 UT) CAPS Take 1 capsule (5,000 Units total) by mouth daily at 12 noon. 07/05/23  Yes Hudnell, Judeth Cornfield, NP  Cyanocobalamin (VITAMIN B-12 PO) Place 1 tablet under the tongue daily.   Yes [provider]  diltiazem (CARDIZEM SR) 120 MG 12 hr capsule Take 1 capsule (120 mg total) by mouth 2 (two) times daily. 08/04/23  Yes Lula Olszewski, MD  Evolocumab (REPATHA SURECLICK) 140 MG/ML SOAJ INJECT THE CONTENTS OF 1 SYRINGE UNDER THE SKIN EVERY 14 DAYS Patient taking differently: Inject 140 mg into the skin every 14 (fourteen) days. INJECT THE CONTENTS OF 1 SYRINGE UNDER THE SKIN EVERY 14 DAYS Next dose is due 11/06/2023. 12/17/22  Yes Pemberton, Kathlynn Grate, MD  famotidine (PEPCID) 20 MG tablet Take 1 tablet (20 mg total) by mouth 2 (two) times daily. 10/28/23  Yes Arnaldo Natal, NP  furosemide (LASIX) 40 MG tablet Take by mouth. 10/22/23  Yes [provider]  JARDIANCE 10 MG TABS tablet TAKE 1 TABLET(10 MG) BY MOUTH DAILY BEFORE BREAKFAST Patient  taking differently: Take 10 mg by mouth daily. 10/13/23  Yes Lula Olszewski, MD  losartan (COZAAR) 25 MG tablet Take 1 tablet (25 mg total) by mouth daily. 11/13/22  Yes Lula Olszewski, MD  montelukast (SINGULAIR) 10 MG tablet TAKE 1 TABLET(10 MG) BY MOUTH DAILY Patient taking differently: Take 10 mg by mouth daily. 09/04/23  Yes Lula Olszewski, MD  nitroGLYCERIN (NITROSTAT) 0.4 MG SL tablet Place 1 tablet (0.4 mg total) under the tongue every 5 (five) minutes as needed for chest pain. 02/10/22  Yes Chandrasekhar, Mahesh A, MD  pantoprazole (PROTONIX) 40 MG tablet TAKE 1 TABLET(40 MG) BY MOUTH TWICE DAILY Patient taking differently: Take 40 mg by mouth 2 (two) times daily. 08/18/23  Yes Lula Olszewski, MD  sucralfate (CARAFATE) 1 g tablet TAKE 1 TABLET(1 GRAM) BY MOUTH TWICE DAILY. MAY INCREASE TO 4 TIMES DAILY FOR ANY ACUTE SYMPTOMS Patient taking differently: Take 1 tablet by mouth at bedtime. 11/26/22  Yes Tressia Danas, MD  triamcinolone (NASACORT) 55 MCG/ACT AERO nasal inhaler Place 1 spray into the nose daily. Start with 1 spray each side twice a day for 3 days, then reduce to daily. 10/08/23  Yes Dulce Sellar, NP  Semaglutide,0.25 or 0.5MG /DOS, (OZEMPIC, 0.25 OR 0.5 MG/DOSE,) 2 MG/3ML SOPN Inject 0.25 mg into the skin once a  week. Patient taking differently: Inject 0.25 mg into the skin once a week. Waiting for Dr to authorize medication per Insurance, per Patient 11/02/2023. 11/02/23   Lula Olszewski, MD  tirzepatide [facility]) 2.5 MG/0.5ML Pen Inject 2.5 mg into the skin once a week. Patient taking differently: Inject 2.5 mg into the skin once a week. Waiting for Dr to get authorization for Insurance per Patient 11/02/2023. 11/02/23   Lula Olszewski, MD    I have reviewed the patient's current medications.  Labs:  Results for orders placed or performed during the hospital encounter of 11/02/23 (from the past 48 hour(s))  Type and screen [facility]      Status: None   Collection Time: 11/02/23  1:30 PM  Result Value Ref Range   ABO/RH(D) A POS    Antibody Screen NEG    Sample Expiration      11/05/2023,2359 Performed at [facility] Lab, [address] [place], [place] [address]   Comprehensive metabolic panel     Status: Abnormal   Collection Time: 11/02/23  1:36 PM  Result Value Ref Range   Sodium 140 135 - 145 mmol/L   Potassium 3.9 3.5 - 5.1 mmol/L   Chloride 106 98 - 111 mmol/L   CO2 25 22 - 32 mmol/L   Glucose, Bld 170 (H) 70 - 99 mg/dL    Comment: Glucose reference range applies only to samples taken after fasting for at least 8 hours.   BUN 70 (H) 6 - 20 mg/dL   Creatinine, Ser 0.98 (H) 0.61 - 1.24 mg/dL   Calcium 9.1 8.9 - 11.9 mg/dL   Total Protein 7.1 6.5 - 8.1 g/dL   Albumin 2.8 (L) 3.5 - 5.0 g/dL   AST 57 (H) 15 - 41 U/L   ALT 71 (H) 0 - 44 U/L   Alkaline Phosphatase 188 (H) 38 - 126 U/L   Total Bilirubin 0.7 <1.2 mg/dL   GFR, Estimated 12 (L) >60 mL/min    Comment: (NOTE) Calculated using the CKD-EPI Creatinine Equation (2021)    Anion gap 9 5 - 15    Comment: Performed at [facility] Lab, [address] [place], [place] [address]  CBC     Status: Abnormal   Collection Time: 11/02/23  1:36 PM  Result Value Ref Range   WBC 5.3 4.0 - 10.5 K/uL   RBC 3.49 (L) 4.22 - 5.81 MIL/uL   Hemoglobin 11.3 (L) 13.0 - 17.0 g/dL   HCT 95.6 (L) 21.3 - 08.6 %   MCV 102.6 (H) 80.0 - 100.0 fL   MCH 32.4 26.0 - 34.0 pg   MCHC 31.6 30.0 - 36.0 g/dL   RDW 57.8 46.9 - 62.9 %   Platelets 54 (L) 150 - 400 K/uL    Comment: Immature Platelet Fraction may be clinically indicated, consider ordering this additional test [id number] REPEATED TO VERIFY    nRBC 0.0 0.0 - 0.2 %    Comment: Performed at [facility] Lab, [address], [place], [place] [address]  Troponin I (High Sensitivity)     Status: None   Collection Time: 11/02/23  1:36 PM  Result Value Ref Range   Troponin I (High Sensitivity) 3 <18 ng/L    Comment:  (NOTE) Elevated high sensitivity troponin I (hsTnI) values and significant  changes across serial measurements may suggest ACS but many other  chronic and acute conditions are known to elevate hsTnI results.  Refer to the "Links" section  for chest pain algorithms and additional  guidance. Performed at [facility] Lab, [address], [place], [place] [address]   Brain natriuretic peptide     Status: None   Collection Time: 11/02/23  1:36 PM  Result Value Ref Range   B Natriuretic Peptide 25.4 0.0 - 100.0 pg/mL    Comment: Performed at [facility] Lab, [address] [place], [place] [address]  POC occult blood, ED     Status: None   Collection Time: 11/02/23  4:06 PM  Result Value Ref Range   Fecal Occult Bld NEGATIVE NEGATIVE  Hemoglobin and hematocrit, blood     Status: Abnormal   Collection Time: 11/02/23  8:58 PM  Result Value Ref Range   Hemoglobin 11.6 (L) 13.0 - 17.0 g/dL   HCT 57.8 (L) 46.9 - 62.9 %    Comment: Performed at [facility] Lab, [address], [place], [place] [address]  CBG monitoring, ED     Status: Abnormal   Collection Time: 11/02/23 10:26 PM  Result Value Ref Range   Glucose-Capillary 123 (H) 70 - 99 mg/dL    Comment: Glucose reference range applies only to samples taken after fasting for at least 8 hours.  CBC     Status: Abnormal   Collection Time: 11/03/23  3:24 AM  Result Value Ref Range   WBC 5.1 4.0 - 10.5 K/uL   RBC 3.37 (L) 4.22 - 5.81 MIL/uL   Hemoglobin 11.3 (L) 13.0 - 17.0 g/dL   HCT 32.4 (L) 40.1 - 02.7 %   MCV 104.7 (H) 80.0 - 100.0 fL   MCH 33.5 26.0 - 34.0 pg   MCHC 32.0 30.0 - 36.0 g/dL   RDW 25.3 66.4 - 40.3 %   Platelets 56 (L) 150 - 400 K/uL    Comment: Immature Platelet Fraction may be clinically indicated, consider ordering this additional test [id number] REPEATED TO VERIFY    nRBC 0.0 0.0 - 0.2 %    Comment: Performed at [facility], [address], [place], [place] [address]  Comprehensive metabolic panel      Status: Abnormal   Collection Time: 11/03/23  3:24 AM  Result Value Ref Range   Sodium 143 135 - 145 mmol/L   Potassium 3.7 3.5 - 5.1 mmol/L   Chloride 111 98 - 111 mmol/L   CO2 23 22 - 32 mmol/L   Glucose, Bld 140 (H) 70 - 99 mg/dL    Comment: Glucose reference range applies only to samples taken after fasting for at least 8 hours.   BUN 65 (H) 6 - 20 mg/dL   Creatinine, Ser 4.33 (H) 0.61 - 1.24 mg/dL   Calcium 8.7 (L) 8.9 - 10.3 mg/dL   Total Protein 6.3 (L) 6.5 - 8.1 g/dL   Albumin 2.6 (L) 3.5 - 5.0 g/dL   AST 53 (H) 15 - 41 U/L   ALT 71 (H) 0 - 44 U/L   Alkaline Phosphatase 185 (H) 38 - 126 U/L   Total Bilirubin 0.6 <1.2 mg/dL   GFR, Estimated 12 (L) >60 mL/min    Comment: (NOTE) Calculated using the CKD-EPI Creatinine Equation (2021)    Anion gap 9 5 - 15    Comment: Performed at [facility] Lab, [address] [place], [place] [address]  Magnesium     Status: None   Collection Time: 11/03/23  3:24 AM  Result Value Ref Range   Magnesium 2.3 1.7 - 2.4 mg/dL  Comment: Performed at [facility] Lab, [address], [place], [place] [address]  Phosphorus     Status: Abnormal   Collection Time: 11/03/23  3:24 AM  Result Value Ref Range   Phosphorus 4.9 (H) 2.5 - 4.6 mg/dL    Comment: Performed at [facility] Lab, [address] [place], [place] [address]  CBG monitoring, ED     Status: Abnormal   Collection Time: 11/03/23  8:22 AM  Result Value Ref Range   Glucose-Capillary 111 (H) 70 - 99 mg/dL    Comment: Glucose reference range applies only to samples taken after fasting for at least 8 hours.  CBG monitoring, ED     Status: None   Collection Time: 11/03/23 12:03 PM  Result Value Ref Range   Glucose-Capillary 98 70 - 99 mg/dL    Comment: Glucose reference range applies only to samples taken after fasting for at least 8 hours.     ROS:  Pertinent items are noted in HPI.  Physical Exam: Vitals:   11/03/23 0622 11/03/23 1105  BP:  (!) 145/66   Pulse:  76  Resp:  (!) 22  Temp: 98.8 F (37.1 C) 98 F (36.7 C)  SpO2:  98%     General exam: Appears calm and comfortable  Respiratory system: Clear to auscultation. Respiratory effort normal. No wheezing or crackle Cardiovascular system: S1 & S2 heard, RRR.  1-2+ pitting edema b/l lower extremities to calves Gastrointestinal system: Abdomen is nondistended, soft and nontender. Central nervous system: Alert and oriented. No focal neurological deficits. Psychiatry: Judgement and insight appear normal. Mood & affect appropriate.   Assessment/Plan: AKI on CKD IV - likely prerenal in the setting of diuresis for the past 2 weeks prior to admission with oral lasix 40mg  daily. Creatinine 5.63 on admission, most recently 4.3 on 11/21 and 3.9 6/28.  On IV normal saline infusion, slight improvement in creatinine to 5.44 one day after admission.   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

## 2023-11-03 NOTE — ED Notes (Signed)
House Tray was ordered for Lunch.

## 2023-11-03 NOTE — Progress Notes (Signed)
PROGRESS NOTE    Sean [name]  WUJ:[id number] DOB: June 02, 1971 DOA: 11/02/2023 PCP: Lula Olszewski, MD    Brief Narrative:  52 year old with history of paroxysmal A-fib on Eliquis, CAD status post CABG, OSA on CPAP, chronic depression, chronic thrombocytopenia, upper GI bleed with history of polyps seen on EGD in 2023, hypothyroidism, severe morbid obese CAD, nonalcoholic steatosis, CHF with preserved EF, CKD stage IV, HLD, gout, HTN comes to the ED with complaints of epigastric pain and dark stools.  Upon admission noted to have AKI as well.   Assessment & Plan:  Principal Problem:   AKI (acute kidney injury) (HCC)    AKI on CKD 4, prerenal in the setting of dehydration from poor oral intake Baseline creatinine 3.2, admission creatinine 5.6.  Continue IV fluids.  Slowly improving.  Noncontrast CT abdomen pelvis shows normal kidney.  Follows outpatient [facility] will consult nephrology due to recent multiple changes in his diuretics.  Colonoscopy in March 22 showed polyps Endoscopy December 2023 showed esophageal polyps   GI bleed, unspecified Acute blood loss anemia Anemia of chronic disease History of bleeding gastric polyps (hyperplastic, no intestinal metaplasia, dysplasia or malignancy) History of chronic gastritis For now we will place patient on Pepcid and PPI.  Also on Carafate.  Hemoglobin is overall stable at this time.  FOBT negative in the ER. 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 Melvyn Neth   Acute blood loss anemia in the setting of the above Baseline hemoglobin 12, admission hemoglobin 11.3.  Home Eliquis is on hold.   Paroxysmal A-fib on Eliquis Hold off Eliquis due to GI bleed. BPs are soft, hold off home diltiazem   Essential hypertension Home med on hold.  IV as needed   Hyperlipidemia On Repatha injections   Type 2 diabetes with hyperglycemia Sliding  scale and Accu-Cheks   Elevated liver enzymes Alkaline phosphatase, AST and ALT elevated Status post cholecystectomy Monitor LFTs closely   Chronic HFpEF, EF 50% Euvolemic on exam Last 2D echo done on 04/16/2023 showed LVEF 50 to 55% with no regional wall motion abnormalities. Start strict I's and O's and daily weight Closely monitor volume status while on IV fluid hydration.   Coronary artery disease status post CABG, not aspirin Resume home regimen Monitor on telemetry   OSA Resume home CPAP nightly   Severe morbid obesity BMI 54 Recommend weight loss outpatient regular physical activity and healthy dieting.   Chronic depression Resume home amitriptyline   Gout Hold off home allopurinol due to acute renal insufficiency     DVT prophylaxis: SCDs Start: 11/02/23 2040 Code Status: Full code Family Communication: Sister on the phone while in the room with him Status is: Inpatient Continue hospital stay for management of AKI    Subjective:  Seen at bedside, no further bleeding.  Tells me that recently his outpatient nephrology has been changing his diuretics and trying to balance between his renal function and AKI.  Examination:  General exam: Appears calm and comfortable ; obese Respiratory system: Clear to auscultation. Respiratory effort normal. Cardiovascular system: S1 & S2 heard, RRR. No JVD, murmurs, rubs, gallops or clicks. No pedal edema. Gastrointestinal system: Abdomen is nondistended, soft and nontender. No organomegaly or masses felt. Normal bowel sounds heard. Central nervous system: Alert and oriented. No focal neurological deficits. Extremities: Symmetric 5 x 5 power. Skin: No rashes, lesions or ulcers Psychiatry: Judgement and insight appear normal. Mood &  affect appropriate.                Diet Orders (From admission, onward)     Start     Ordered   11/03/23 1146  Diet Heart Room service appropriate? Yes; Fluid consistency: Thin   Diet effective now       Question Answer Comment  Room service appropriate? Yes   Fluid consistency: Thin      11/03/23 1146            Objective: Vitals:   11/03/23 0535 11/03/23 0536 11/03/23 0622 11/03/23 1105  BP:    (!) 145/66  Pulse: 72 76  76  Resp: 18 19  (!) 22  Temp:   98.8 F (37.1 C) 98 F (36.7 C)  TempSrc:   Oral Oral  SpO2: 95% 96%  98%  Weight:      Height:        Intake/Output Summary (Last 24 hours) at 11/03/2023 1413 Last data filed at 11/02/2023 2255 Gross per 24 hour  Intake 50 ml  Output --  Net 50 ml   Filed Weights   11/02/23 1322  Weight: (!) 177.7 kg    Scheduled Meds:  amitriptyline  25 mg Oral QHS   vitamin B-12  1,000 mcg Oral Daily   hydrocortisone  25 mg Rectal BID   insulin aspart  0-5 Units Subcutaneous QHS   insulin aspart  0-9 Units Subcutaneous TID WC   montelukast  10 mg Oral Daily   sucralfate  1 g Oral QHS   Continuous Infusions:  sodium chloride 50 mL/hr at 11/03/23 1153   famotidine (PEPCID) IV 20 mg (11/03/23 1151)    Nutritional status     Body mass index is 54.64 kg/m.  Data Reviewed:   CBC: Recent Labs  Lab 11/02/23 1336 11/02/23 2058 11/03/23 0324  WBC 5.3  --  5.1  HGB 11.3* 11.6* 11.3*  HCT 35.8* 35.9* 35.3*  MCV 102.6*  --  104.7*  PLT 54*  --  56*   Basic Metabolic Panel: Recent Labs  Lab 11/02/23 1336 11/03/23 0324  NA 140 143  K 3.9 3.7  CL 106 111  CO2 25 23  GLUCOSE 170* 140*  BUN 70* 65*  CREATININE 5.63* 5.44*  CALCIUM 9.1 8.7*  MG  --  2.3  PHOS  --  4.9*   GFR: Estimated Creatinine Clearance: 26.7 mL/min (A) (by C-G formula based on SCr of 5.44 mg/dL (H)). Liver Function Tests: Recent Labs  Lab 11/02/23 1336 11/03/23 0324  AST 57* 53*  ALT 71* 71*  ALKPHOS 188* 185*  BILITOT 0.7 0.6  PROT 7.1 6.3*  ALBUMIN 2.8* 2.6*   No results for input(s): "LIPASE", "AMYLASE" in the last 168 hours. No results for input(s): "AMMONIA" in the last 168 hours. Coagulation  Profile: No results for input(s): "INR", "PROTIME" in the last 168 hours. Cardiac Enzymes: No results for input(s): "CKTOTAL", "CKMB", "CKMBINDEX", "TROPONINI" in the last 168 hours. BNP (last 3 results) No results for input(s): "PROBNP" in the last 8760 hours. HbA1C: No results for input(s): "HGBA1C" in the last 72 hours. CBG: Recent Labs  Lab 11/02/23 2226 11/03/23 0822 11/03/23 1203  GLUCAP 123* 111* 98   Lipid Profile: No results for input(s): "CHOL", "HDL", "LDLCALC", "TRIG", "CHOLHDL", "LDLDIRECT" in the last 72 hours. Thyroid Function Tests: No results for input(s): "TSH", "T4TOTAL", "FREET4", "T3FREE", "THYROIDAB" in the last 72 hours. Anemia Panel: No results for input(s): "VITAMINB12", "FOLATE", "FERRITIN", "TIBC", "IRON", "RETICCTPCT"  in the last 72 hours. Sepsis Labs: No results for input(s): "PROCALCITON", "LATICACIDVEN" in the last 168 hours.  No results found for this or any previous visit (from the past 240 hour(s)).       Radiology Studies: CT ABDOMEN PELVIS WO CONTRAST  Result Date: 11/02/2023 CLINICAL DATA:  Acute nonlocalized abdominal pain. Right red blood in stool this morning. EXAM: CT ABDOMEN AND PELVIS WITHOUT CONTRAST TECHNIQUE: Multidetector CT imaging of the abdomen and pelvis was performed following the standard protocol without IV contrast. RADIATION DOSE REDUCTION: This exam was performed according to the departmental dose-optimization program which includes automated exposure control, adjustment of the mA and/or kV according to patient size and/or use of iterative reconstruction technique. COMPARISON:  01/26/2023 FINDINGS: Lower chest: Lung bases are clear. Postoperative changes in the mediastinum. Elevation of the left hemidiaphragm is unchanged. Hepatobiliary: No focal liver abnormality is seen. Status post cholecystectomy. No biliary dilatation. Pancreas: Unremarkable. No pancreatic ductal dilatation or surrounding inflammatory changes. Spleen:  Normal in size without focal abnormality. Adrenals/Urinary Tract: Adrenal glands are unremarkable. Kidneys are normal, without renal calculi, focal lesion, or hydronephrosis. Bladder is unremarkable. Stomach/Bowel: Stomach, small bowel, and colon are not abnormally distended. Scattered stool in the colon. No wall thickening or inflammatory changes. Appendix is not identified. Vascular/Lymphatic: Aortic atherosclerosis. No enlarged abdominal or pelvic lymph nodes. Reproductive: Prostate gland is atrophic or surgically absent. No pelvic mass or lymphadenopathy. Other: No free air or free fluid in the abdomen. Small periumbilical hernia containing fat. Cystic structure in the left periumbilical space measuring 4 cm diameter. This is likely a sebaceous cyst. No change since previous study. Musculoskeletal: Degenerative changes in the spine. 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 IMPRESSION: 1. No acute process demonstrated in the abdomen or pelvis. No significant change since previous study. 2. Chronic elevation of the left hemidiaphragm. 3. Mild aortic atherosclerosis. 4. Cystic structure adjacent to the umbilicus likely representing a sebaceous cyst. No change. Electronically Signed   By: Burman Nieves M.D.   On: 11/02/2023 20:15   DG Chest Portable 1 View  Result Date: 11/02/2023 CLINICAL DATA:  Shortness of breath. Bright red blood in stool this morning. Midsternal chest pain. Fatigue. EXAM: PORTABLE CHEST 1 VIEW COMPARISON:  01/26/2023 FINDINGS: Postoperative changes in the mediastinum with sternotomy wires present. Elevation of the left hemidiaphragm is unchanged since prior study. Lungs are clear. No pleural effusions. No pneumothorax. Mediastinal contours appear intact. IMPRESSION: Elevation of left hemidiaphragm is unchanged.  Lungs are clear. Electronically Signed   By: Burman Nieves M.D.   On: 11/02/2023 20:10           LOS: 1 day   Time spent= 35 mins    Miguel Rota,  MD [facility]  If 7PM-7AM, please contact night-coverage  11/03/2023, 2:13 PM

## 2023-11-03 NOTE — Telephone Encounter (Signed)
Pharmacy Patient Advocate Encounter   Received notification from Pt Calls Messages that prior authorization for Zepbound 2.5mg /0.20ml is required/requested.   Insurance verification completed.   The patient is insured through [facility] .   Per test claim: PA required; PA submitted to above mentioned insurance via CoverMyMeds Key/confirmation #/EOC [id number] Status is pending

## 2023-11-04 ENCOUNTER — Other Ambulatory Visit (HOSPITAL_COMMUNITY): Payer: Self-pay

## 2023-11-04 DIAGNOSIS — N179 Acute kidney failure, unspecified: Secondary | ICD-10-CM | POA: Diagnosis not present

## 2023-11-04 LAB — GLUCOSE, CAPILLARY: Glucose-Capillary: 153 mg/dL — ABNORMAL HIGH (ref 70–99)

## 2023-11-04 LAB — RENAL FUNCTION PANEL
Albumin: 2.5 g/dL — ABNORMAL LOW (ref 3.5–5.0)
Anion gap: 8 (ref 5–15)
BUN: 62 mg/dL — ABNORMAL HIGH (ref 6–20)
CO2: 22 mmol/L (ref 22–32)
Calcium: 8.3 mg/dL — ABNORMAL LOW (ref 8.9–10.3)
Chloride: 111 mmol/L (ref 98–111)
Creatinine, Ser: 5.25 mg/dL — ABNORMAL HIGH (ref 0.61–1.24)
GFR, Estimated: 13 mL/min — ABNORMAL LOW (ref >60)
Glucose, Bld: 132 mg/dL — ABNORMAL HIGH (ref 70–99)
Phosphorus: 4.8 mg/dL — ABNORMAL HIGH (ref 2.5–4.6)
Potassium: 4 mmol/L (ref 3.5–5.1)
Sodium: 141 mmol/L (ref 135–145)

## 2023-11-04 LAB — MAGNESIUM: Magnesium: 2.2 mg/dL (ref 1.7–2.4)

## 2023-11-04 LAB — CBC
HCT: 31.6 % — ABNORMAL LOW (ref 39.0–52.0)
Hemoglobin: 10.2 g/dL — ABNORMAL LOW (ref 13.0–17.0)
MCH: 32.4 pg (ref 26.0–34.0)
MCHC: 32.3 g/dL (ref 30.0–36.0)
MCV: 100.3 fL — ABNORMAL HIGH (ref 80.0–100.0)
Platelets: 54 10*3/uL — ABNORMAL LOW (ref 150–400)
RBC: 3.15 MIL/uL — ABNORMAL LOW (ref 4.22–5.81)
RDW: 14.8 % (ref 11.5–15.5)
WBC: 4.9 10*3/uL (ref 4.0–10.5)
nRBC: 0 % (ref 0.0–0.2)

## 2023-11-04 MED ORDER — POLYETHYLENE GLYCOL 3350 17 G PO PACK
17.0000 g | PACK | Freq: Every day | ORAL | 0 refills | Status: DC
  Filled 2023-11-04: qty 14, 14d supply, fill #0

## 2023-11-04 MED ORDER — HYDROCORTISONE (PERIANAL) 2.5 % EX CREA
TOPICAL_CREAM | Freq: Two times a day (BID) | CUTANEOUS | 0 refills | Status: DC
  Filled 2023-11-04: qty 30, 30d supply, fill #0

## 2023-11-04 MED ORDER — DOCUSATE SODIUM 100 MG PO CAPS
200.0000 mg | ORAL_CAPSULE | Freq: Two times a day (BID) | ORAL | Status: DC
  Administered 2023-11-04: 200 mg via ORAL
  Filled 2023-11-04: qty 2

## 2023-11-04 MED ORDER — ALLOPURINOL 100 MG PO TABS: 100.0000 mg | ORAL_TABLET | Freq: Every day | ORAL | Status: DC

## 2023-11-04 MED ORDER — TORSEMIDE 10 MG PO TABS
10.0000 mg | ORAL_TABLET | Freq: Every day | ORAL | 0 refills | Status: DC
  Filled 2023-11-04: qty 30, 30d supply, fill #0

## 2023-11-04 MED ORDER — DOCUSATE SODIUM 100 MG PO CAPS
200.0000 mg | ORAL_CAPSULE | Freq: Two times a day (BID) | ORAL | 0 refills | Status: DC
  Filled 2023-11-04: qty 30, 8d supply, fill #0

## 2023-11-04 NOTE — Plan of Care (Signed)
  Problem: Education: Goal: Ability to describe self-care measures that may prevent or decrease complications (Diabetes Survival Skills Education) will improve Outcome: Progressing   Problem: Coping: Goal: Ability to adjust to condition or change in health will improve Outcome: Progressing   Problem: Fluid Volume: Goal: Ability to maintain a balanced intake and output will improve Outcome: Progressing   Problem: Health Behavior/Discharge Planning: Goal: Ability to identify and utilize available resources and services will improve Outcome: Progressing Goal: Ability to manage health-related needs will improve Outcome: Progressing   Problem: Metabolic: Goal: Ability to maintain appropriate glucose levels will improve Outcome: Progressing   Problem: Clinical Measurements: Goal: Diagnostic test results will improve Outcome: Progressing Goal: Respiratory complications will improve Outcome: Progressing   Problem: Activity: Goal: Risk for activity intolerance will decrease Outcome: Progressing   Problem: Coping: Goal: Level of anxiety will decrease Outcome: Progressing

## 2023-11-04 NOTE — Progress Notes (Signed)
Discharge instructions given. Patient verbalized understanding and all questions were answered.  ?

## 2023-11-04 NOTE — Discharge Summary (Signed)
[place] Vandagriff BJY:[id number] DOB: 13-Jul-1971 DOA: 11/02/2023  PCP: Lula Olszewski, MD  Admit date: 11/02/2023  Discharge date: 11/04/2023  Admitted From: Home   Disposition:  Home   Recommendations for Outpatient Follow-up:   Follow up with PCP in 1-2 weeks  PCP Please obtain BMP/CBC, 2 view CXR in 1week,  (see Discharge instructions)   PCP Please follow up on the following pending results: Monitor fluid status, CMP and CBC closely.  Needs close outpatient follow-up with his nephrologist, monitor LFTs closely as well.   Home Health: None   Equipment/Devices: None  Consultations: GI, Renal Discharge Condition: Stable    CODE STATUS: Full    Diet Recommendation: Renal diet with 1.5 L fluid restriction per day    Chief Complaint  Patient presents with   GI Bleeding     Brief history of present illness from the day of admission and additional interim summary    52 year old with history of paroxysmal A-fib on Eliquis, CAD status post CABG, OSA on CPAP, chronic depression, chronic thrombocytopenia, upper GI bleed with history of polyps seen on EGD in 2023, hypothyroidism, severe morbid obese CAD, nonalcoholic steatosis, CHF with preserved EF, CKD stage IV, HLD, gout, HTN comes to the ED with complaints of epigastric pain and dark stools. Upon admission noted to have AKI as well.                                                                  Hospital Course   AKI on CKD 4, prerenal in the setting of dehydration from poor oral intake Is discussed with nephrologist Dr. Allena Katz on 11/04/2023, AKI likely due to overdiuresis and him being on diuretics along with Jardiance and ARB, offending medications held, has been hydrated renal function has stabilized, good urine output, no signs of uremia, will be discharged on  10 of Demadex to be started tomorrow.  Holding ARB and Jardiance upon discharge.  Follow-up with PCP and primary nephrologist within a week of discharge.   GI bleed, unspecified Acute blood loss anemia Anemia of chronic disease History of bleeding gastric polyps (hyperplastic, no intestinal metaplasia, dysplasia or malignancy) History of chronic gastritis In by [facility] thought to have hemorrhoidal bleed which she has history of, he was constipated a few days ago, GI has signed off, placed on stool softeners, continue Eliquis PCP to monitor CBC.   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 Melvyn Neth   Acute blood loss anemia in the setting of the above Baseline hemoglobin 12, admission hemoglobin 11.3.   Fall due to heme dilution.  PCP to monitor.   Paroxysmal A-fib on Eliquis New Eliquis and diltiazem   Essential hypertension Stable on diltiazem   Hyperlipidemia On Repatha injections   Type 2 diabetes with hyperglycemia  Continue home regimen unchanged   Mild asymptomatic transaminitis.  Stable trend.  PCP to monitor.  No symptoms.   Chronic HFpEF, EF 50% Euvolemic on exam Last 2D echo done on 04/16/2023 showed LVEF 50 to 55% with no regional wall motion abnormalities. Decompensated continue home regimen, diuretics adjusted as above   Coronary artery disease status post CABG, not aspirin Resume home regimen Monitor on telemetry   OSA Resumed home CPAP nightly   Severe morbid obesity  BMI 54, Recommend weight loss outpatient regular physical activity and healthy dieting.   Chronic depression  Resume home amitriptyline   Gout  Hold off home allopurinol due to acute renal insufficiency 1 week thereafter per PCP.    Discharge diagnosis     Principal Problem:   AKI (acute kidney injury) (HCC) Active Problems:   Metabolic dysfunction-associated steatohepatitis (MASH)   Rectal bleeding    Discharge instructions    Discharge  Instructions     Discharge instructions   Complete by: As directed    Follow with Primary MD Lula Olszewski, MD in 2-3 days   Get CBC, CMP, 2 view Chest X ray -  checked next visit with your primary MD    Activity: As tolerated with Full fall precautions use walker/cane & assistance as needed  Disposition Home    Diet: Renal diet with 1.5 L fluid restriction per day  Special Instructions: If you have smoked or chewed Tobacco  in the last 2 yrs please stop smoking, stop any regular Alcohol  and or any Recreational drug use.  On your next visit with your primary care physician please Get Medicines reviewed and adjusted.  Please request your Prim.MD to go over all Hospital Tests and Procedure/Radiological results at the follow up, please get all Hospital records sent to your Prim MD by signing hospital release before you go home.  If you experience worsening of your admission symptoms, develop shortness of breath, life threatening emergency, suicidal or homicidal thoughts you must seek medical attention immediately by calling 911 or calling your MD immediately  if symptoms less severe.  You Must read complete instructions/literature along with all the possible adverse reactions/side effects for all the Medicines you take and that have been prescribed to you. Take any new Medicines after you have completely understood and accpet all the possible adverse reactions/side effects.   Do not drive when taking Pain medications.  Do not take more than prescribed Pain, Sleep and Anxiety Medications   Increase activity slowly   Complete by: As directed        Discharge Medications   Allergies as of 11/04/2023       Reactions   Vibra-tab [doxycycline] Shortness Of Breath   Glucophage [metformin] Other (See Comments)   Pt told by his nephrologist not to take this medication due to his kidney function.   Imdur [isosorbide Dinitrate] Diarrhea   Valtrex [valacyclovir] Itching   Augmentin  [amoxicillin-pot Clavulanate] Itching   Tape Rash        Medication List     STOP taking these medications    furosemide 40 MG tablet Commonly known as: LASIX   losartan 25 MG tablet Commonly known as: COZAAR       TAKE these medications    acetaminophen 500 MG tablet Commonly known as: TYLENOL Take 1,500 mg by mouth once as needed for moderate pain (pain score 4-6), fever, headache or mild pain (pain score 1-3).   allopurinol 100 MG tablet Commonly known as: ZYLOPRIM  Take 1 tablet (100 mg total) by mouth daily. Start taking on: November 11, 2023 What changed:  See the new instructions. These instructions start on November 11, 2023. If you are unsure what to do until then, ask your doctor or other care provider.   amitriptyline 25 MG tablet Commonly known as: ELAVIL Take 1 tablet (25 mg total) by mouth at bedtime.   apixaban 5 MG Tabs tablet Commonly known as: Eliquis Take 1 tablet (5 mg total) by mouth 2 (two) times daily.   diltiazem 120 MG 12 hr capsule Commonly known as: CARDIZEM SR Take 1 capsule (120 mg total) by mouth 2 (two) times daily.   docusate sodium 100 MG capsule Commonly known as: COLACE Take 2 capsules (200 mg total) by mouth 2 (two) times daily.   famotidine 20 MG tablet Commonly known as: PEPCID Take 1 tablet (20 mg total) by mouth 2 (two) times daily.   hydrocortisone 2.5 % rectal cream Commonly known as: ANUSOL-HC Place rectally 2 (two) times daily.   Jardiance 10 MG Tabs tablet Generic drug: empagliflozin TAKE 1 TABLET(10 MG) BY MOUTH DAILY BEFORE BREAKFAST What changed: See the new instructions.   montelukast 10 MG tablet Commonly known as: SINGULAIR TAKE 1 TABLET(10 MG) BY MOUTH DAILY What changed: See the new instructions.   nitroGLYCERIN 0.4 MG SL tablet Commonly known as: Nitrostat Place 1 tablet (0.4 mg total) under the tongue every 5 (five) minutes as needed for chest pain.   Ozempic (0.25 or 0.5 MG/DOSE) 2 MG/3ML  Sopn Generic drug: Semaglutide(0.25 or 0.5MG /DOS) Inject 0.25 mg into the skin once a week. What changed: additional instructions   pantoprazole 40 MG tablet Commonly known as: PROTONIX TAKE 1 TABLET(40 MG) BY MOUTH TWICE DAILY What changed: See the new instructions.   polyethylene glycol 17 g packet Commonly known as: MIRALAX / GLYCOLAX Take 17 g by mouth daily. Start taking on: November 05, 2023   Repatha SureClick 140 MG/ML Soaj Generic drug: Evolocumab INJECT THE CONTENTS OF 1 SYRINGE UNDER THE SKIN EVERY 14 DAYS What changed:  how much to take how to take this when to take this additional instructions   sucralfate 1 g tablet Commonly known as: CARAFATE TAKE 1 TABLET(1 GRAM) BY MOUTH TWICE DAILY. MAY INCREASE TO 4 TIMES DAILY FOR ANY ACUTE SYMPTOMS What changed: See the new instructions.   tirzepatide 2.5 MG/0.5ML Pen Commonly known as: MOUNJARO Inject 2.5 mg into the skin once a week. What changed: additional instructions   torsemide 10 MG tablet Commonly known as: DEMADEX Take 1 tablet (10 mg total) by mouth daily. Start taking on: November 05, 2023   triamcinolone 55 MCG/ACT Aero nasal inhaler Commonly known as: NASACORT Place 1 spray into the nose daily. Start with 1 spray each side twice a day for 3 days, then reduce to daily.   VITAMIN B-12 PO Place 1 tablet under the tongue daily.   Vitamin D3 125 MCG (5000 UT) Caps Take 1 capsule (5,000 Units total) by mouth daily at 12 noon.         Follow-up Information     Lula Olszewski, MD. Schedule an appointment as soon as possible for a visit in 3 day(s).   Specialty: Internal Medicine Why: Also follow-up with your nephrologist in 2 to 3 days Contact information: [address] [place] [address] [phone]                 Major procedures and Radiology Reports - PLEASE review  detailed and final reports thoroughly  -     CT ABDOMEN PELVIS WO CONTRAST  Result Date:  11/02/2023 CLINICAL DATA:  Acute nonlocalized abdominal pain. Right red blood in stool this morning. EXAM: CT ABDOMEN AND PELVIS WITHOUT CONTRAST TECHNIQUE: Multidetector CT imaging of the abdomen and pelvis was performed following the standard protocol without IV contrast. RADIATION DOSE REDUCTION: This exam was performed according to the departmental dose-optimization program which includes automated exposure control, adjustment of the mA and/or kV according to patient size and/or use of iterative reconstruction technique. COMPARISON:  01/26/2023 FINDINGS: Lower chest: Lung bases are clear. Postoperative changes in the mediastinum. Elevation of the left hemidiaphragm is unchanged. Hepatobiliary: No focal liver abnormality is seen. Status post cholecystectomy. No biliary dilatation. Pancreas: Unremarkable. No pancreatic ductal dilatation or surrounding inflammatory changes. Spleen: Normal in size without focal abnormality. Adrenals/Urinary Tract: Adrenal glands are unremarkable. Kidneys are normal, without renal calculi, focal lesion, or hydronephrosis. Bladder is unremarkable. Stomach/Bowel: Stomach, small bowel, and colon are not abnormally distended. Scattered stool in the colon. No wall thickening or inflammatory changes. Appendix is not identified. Vascular/Lymphatic: Aortic atherosclerosis. No enlarged abdominal or pelvic lymph nodes. Reproductive: Prostate gland is atrophic or surgically absent. No pelvic mass or lymphadenopathy. Other: No free air or free fluid in the abdomen. Small periumbilical hernia containing fat. Cystic structure in the left periumbilical space measuring 4 cm diameter. This is likely a sebaceous cyst. No change since previous study. Musculoskeletal: Degenerative changes in the spine. 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 IMPRESSION: 1. No acute process demonstrated in the abdomen or pelvis. No significant change since previous study. 2. Chronic elevation of the left  hemidiaphragm. 3. Mild aortic atherosclerosis. 4. Cystic structure adjacent to the umbilicus likely representing a sebaceous cyst. No change. Electronically Signed   By: Burman Nieves M.D.   On: 11/02/2023 20:15   DG Chest Portable 1 View  Result Date: 11/02/2023 CLINICAL DATA:  Shortness of breath. Bright red blood in stool this morning. Midsternal chest pain. Fatigue. EXAM: PORTABLE CHEST 1 VIEW COMPARISON:  01/26/2023 FINDINGS: Postoperative changes in the mediastinum with sternotomy wires present. Elevation of the left hemidiaphragm is unchanged since prior study. Lungs are clear. No pleural effusions. No pneumothorax. Mediastinal contours appear intact. IMPRESSION: Elevation of left hemidiaphragm is unchanged.  Lungs are clear. Electronically Signed   By: Burman Nieves M.D.   On: 11/02/2023 20:10    Micro Results     No results found for this or any previous visit (from the past 240 hour(s)).  Today   Subjective    Garreth Flatten today has no headache,no chest abdominal pain,no new weakness tingling or numbness, feels much better wants to go home today.     Objective   Blood pressure 137/62, pulse 77, temperature (!) 97.4 F (36.3 C), temperature source Axillary, resp. rate (!) 22, height 5\' 11"  (1.803 m), weight (!) 180 kg, SpO2 95%.   Intake/Output Summary (Last 24 hours) at 11/04/2023 0938 Last data filed at 11/04/2023 0500 Gross per 24 hour  Intake 800.82 ml  Output 550 ml  Net 250.82 ml    Exam  Awake Alert, No new F.N deficits,    Kim.AT,PERRAL Supple Neck,   Symmetrical Chest wall movement, Good air movement bilaterally, CTAB RRR,No Gallops,   +ve B.Sounds, Abd Soft, Non tender,  No Cyanosis, Clubbing or edema    Data Review   Recent Labs  Lab 11/02/23 1336 11/02/23 2058 11/03/23 0324 11/04/23 0427  WBC 5.3  --  5.1 4.9  HGB 11.3* 11.6* 11.3* 10.2*  HCT 35.8* 35.9* 35.3* 31.6*  PLT 54*  --  56* 54*  MCV 102.6*  --  104.7* 100.3*  MCH 32.4  --   33.5 32.4  MCHC 31.6  --  32.0 32.3  RDW 14.6  --  14.8 14.8    Recent Labs  Lab 11/02/23 1336 11/03/23 0324 11/04/23 0427  NA 140 143 141  K 3.9 3.7 4.0  CL 106 111 111  CO2 25 23 22   ANIONGAP 9 9 8   GLUCOSE 170* 140* 132*  BUN 70* 65* 62*  CREATININE 5.63* 5.44* 5.25*  AST 57* 53*  --   ALT 71* 71*  --   ALKPHOS 188* 185*  --   BILITOT 0.7 0.6  --   ALBUMIN 2.8* 2.6* 2.5*  BNP 25.4  --   --   MG  --  2.3 2.2  CALCIUM 9.1 8.7* 8.3*    Total Time in preparing paper work, data evaluation and todays exam - 35 minutes  Signature  -    Susa Raring M.D on 11/04/2023 at 9:38 AM   -  To page go to [email or web address]

## 2023-11-04 NOTE — Consult Note (Signed)
[facility] Nephrology Consultation Note  Requesting MD: Stephania Fragmin MD Reason for consult: AKI  HPI:  Sean [name] is a 52 y.o. male with history of HFpEF, CKD 4, paroxysmal A-fib on Eliquis, CAD s/p CABG, hypertension, hyperlipidemia, gout, OSA, depression, thrombocytopenia, upper GI bleed, hypothyroidism, obesity, nonalcoholic steatohepatitis presenting due to GI bleeding and epigastric pain.  Consulted for AKI   Today, Cr slightly improved to 5.25 from 5.63 on arrival. On 50cc/hr NS infusion. Patient reports no concerns, denies SOB, feels his swelling has gone down a bit.  Creatinine  Date/Time Value Ref Range Status  07/27/2023 12:00 AM 3.2 (A) 0.6 - 1.3 Final  04/10/2023 12:00 AM 3.0 (A) 0.6 - 1.3 Final  04/03/2023 04:09 PM 3.02 (H) 0.61 - 1.24 mg/dL Final  52/84/1324 40:10 PM 2.33 (H) 0.61 - 1.24 mg/dL Final  27/25/3664 40:34 AM 2.4 (A) 0.6 - 1.3 Final  03/24/2022 12:00 AM 2.1 (A) 0.6 - 1.3 Final   Creat  Date/Time Value Ref Range Status  12/13/2021 06:08 PM 1.95 (H) 0.60 - 1.29 mg/dL Final  74/25/9563 87:56 PM 2.15 (H) 0.60 - 1.29 mg/dL Final  43/32/9518 84:16 AM 1.44 (H) 0.60 - 1.35 mg/dL Final  60/63/0160 10:93 AM 1.31 0.60 - 1.35 mg/dL Final   Creatinine, Ser  Date/Time Value Ref Range Status  11/04/2023 04:27 AM 5.25 (H) 0.61 - 1.24 mg/dL Final  23/55/7322 02:54 AM 5.44 (H) 0.61 - 1.24 mg/dL Final  27/05/2375 28:31 PM 5.63 (H) 0.61 - 1.24 mg/dL Final  51/76/1607 37:10 AM 3.81 (H) 0.61 - 1.24 mg/dL Final  62/69/4854 62:70 AM 3.76 (H) 0.61 - 1.24 mg/dL Final  35/00/9381 82:99 PM 3.35 (H) 0.61 - 1.24 mg/dL Final  37/16/9678 93:81 PM 2.51 (H) 0.40 - 1.50 mg/dL Final  01/75/1025 85:27 PM 2.66 (H) 0.40 - 1.50 mg/dL Final  78/24/2353 61:44 PM 2.95 (H) 0.40 - 1.50 mg/dL Final  31/54/0086 76:19 PM 3.60 (H) 0.40 - 1.50 mg/dL Final  50/93/2671 24:58 PM 1.98 (H) 0.40 - 1.50 mg/dL Final  09/98/3382 50:53 AM 1.97 (H) 0.40 - 1.50 mg/dL Final  97/67/3419  37:90 PM 1.96 (H) 0.40 - 1.50 mg/dL Final  24/08/7352 29:92 AM 1.80 (H) 0.76 - 1.27 mg/dL Final  42/68/3419 62:22 PM 1.60 (H) 0.40 - 1.50 mg/dL Final  97/98/9211 94:17 AM 1.64 (H) 0.76 - 1.27 mg/dL Final  40/81/4481 85:63 AM 1.67 (H) 0.61 - 1.24 mg/dL Final  14/97/0263 78:58 AM 1.42 (H) 0.76 - 1.27 mg/dL Final  85/01/7740 28:78 PM 1.38 (H) 0.76 - 1.27 mg/dL Final  67/67/2094 70:96 PM 1.68 (H) 0.76 - 1.27 mg/dL Final  28/36/6294 76:54 PM 1.65 (H) 0.76 - 1.27 mg/dL Final  65/01/5464 68:12 PM 1.48 (H) 0.76 - 1.27 mg/dL Final  75/17/0017 49:44 AM 1.43 (H) 0.61 - 1.24 mg/dL Final  96/75/9163 84:66 PM 1.49 (H) 0.76 - 1.27 mg/dL Final  59/93/5701 77:93 AM 1.44 (H) 0.76 - 1.27 mg/dL Final  90/30/0923 30:07 AM 1.36 (H) 0.61 - 1.24 mg/dL Final  62/26/3335 45:62 AM 1.34 (H) 0.61 - 1.24 mg/dL Final  56/38/9373 42:87 AM 1.46 (H) 0.61 - 1.24 mg/dL Final  68/10/5725 20:35 AM 1.26 (H) 0.61 - 1.24 mg/dL Final  59/74/1638 45:36 PM 1.42 (H) 0.61 - 1.24 mg/dL Final  46/80/3212 24:82 AM 1.38 (H) 0.61 - 1.24 mg/dL Final  50/01/7047 88:91 AM 1.82 (H) 0.61 - 1.24 mg/dL Final  69/45/0388 82:80 AM 1.54 (H) 0.61 - 1.24 mg/dL Final  03/49/1791 50:56 PM 1.40 (H) 0.61 - 1.24  mg/dL Final  16/09/9603 54:09 PM 1.56 (H) 0.61 - 1.24 mg/dL Final  81/19/1478 29:56 AM 1.38 (H) 0.61 - 1.24 mg/dL Final  21/30/8657 84:69 PM 1.25 (H) 0.61 - 1.24 mg/dL Final  62/95/2841 32:44 PM 1.10 0.61 - 1.24 mg/dL Final  12/03/7251 66:44 AM 1.20 0.61 - 1.24 mg/dL Final  03/47/4259 56:38 AM 1.10 0.61 - 1.24 mg/dL Final  75/64/3329 51:88 AM 1.00 0.61 - 1.24 mg/dL Final  41/66/0630 16:01 AM 1.31 (H) 0.61 - 1.24 mg/dL Final  09/32/3557 32:20 AM 1.20 0.61 - 1.24 mg/dL Final  25/42/7062 37:62 AM 1.30 (H) 0.61 - 1.24 mg/dL Final  83/15/1761 60:73 AM 1.47 (H) 0.61 - 1.24 mg/dL Final  71/05/2693 85:46 AM 1.24 0.50 - 1.35 mg/dL Final    Comment:    **Please note change in reference range.**  04/23/2011 09:50 AM 1.07 0.4 - 1.5 mg/dL Final      PMHx:   Past Medical History:  Diagnosis Date   Abnormal liver enzymes    a. Sees a doctor in [place].   Cardiac cirrhosis    a. possible elevated LFTs/low platelets felt due to cardiac cirrhosis per 2017 admission.   Chronic combined systolic and diastolic CHF (congestive heart failure) (HCC)    CKD (chronic kidney disease) stage 3, GFR 30-59 ml/min (HCC) 01/04/2016   Lab Results  Component  Value  Date/Time     GFR  29.24 (L)  09/30/2022 02:51 PM     GFR  27.27 (L)  09/24/2022 03:40 PM     GFR  24.10 (L)  09/03/2022 03:45 PM     GFR  18.98 (L)  08/25/2022 12:21 PM     GFR  38.98 (L)  04/24/2022 02:39 PM         CKD (chronic kidney disease), stage II    Stage 4   Congenital heart defect    a. rightward rotation of heart, almost dextrocardia   Coronary artery disease    a. s/p CABGx1 in 12/2015.   Gastric polyps 11/25/2022   Gastritis and gastroduodenitis 11/25/2022          Gastroesophageal reflux disease with esophagitis and hemorrhage 04/04/2021   Hematuria    a. Chronic hx of this, no prior etiology determined through workup per patient.   History of umbilical hernia repair 07/10/2011   History of hernia surgery twice prior   Hypercholesteremia    a. Prev taken off statin due to abnormal liver function.   Hypertension    Incisional hernia 07/10/2011   History of hernia surgery twice prior   Morbid obesity (HCC)    Morbid obesity with BMI of 50.0-59.9, adult (HCC) 01/04/2016   After [place] [facility] doctor is setting him up with a wellness clinic to help with weight losS  He reports not having tried anything for weight loss either diet or or medicine or surgery in the past  We failed to get [facility] 10/2022      Wt Readings from Last 10 Encounters:  11/13/22  (!) 384 lb 12.8 oz (174.5 kg)  10/13/22  (!) 385 lb (174.6 kg)  09/30/22  (!) 384 lb (174.2 kg)  09/24/22  (!) 382 lb (1   Nausea and vomiting 11/25/2022   OSA on CPAP    Paroxysmal atrial flutter (HCC) 02/12/2016   S/P  Off-pump 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   LIMA to LAD   Sinus bradycardia    Thrombocytopenia (HCC)     Past Surgical History:  Procedure Laterality Date  APPENDECTOMY     BIOPSY  02/19/2021   Procedure: BIOPSY;  Surgeon: Tressia Danas, MD;  Location: [facility];  Service: Gastroenterology;;   BIOPSY  11/25/2022   Procedure: BIOPSY;  Surgeon: Tressia Danas, MD;  Location: [facility] ENDOSCOPY;  Service: Gastroenterology;;   CARDIAC CATHETERIZATION N/A 12/24/2015   Procedure: Right/Left Heart Cath and Coronary Angiography;  Surgeon: Dolores Patty, MD;  Location: [facility] INVASIVE CV LAB;  Service: Cardiovascular;  Laterality: N/A;   CARDIOVERSION N/A 02/14/2016   Procedure: CARDIOVERSION;  Surgeon: Chrystie Nose, MD;  Location: [facility] ENDOSCOPY;  Service: Cardiovascular;  Laterality: N/A;   COLONOSCOPY WITH PROPOFOL N/A 02/19/2021   Procedure: COLONOSCOPY WITH PROPOFOL;  Surgeon: Tressia Danas, MD;  Location: [facility] ENDOSCOPY;  Service: Gastroenterology;  Laterality: N/A;   CORONARY ARTERY BYPASS GRAFT N/A 12/26/2015   Procedure: Off Pump Coronary Artery Bypass Grafting times one using left internal mammary artery;  Surgeon: Purcell Nails, MD;  Location: [facility] OR;  Service: Open Heart Surgery;  Laterality: N/A;   ESOPHAGOGASTRODUODENOSCOPY (EGD) WITH PROPOFOL N/A 02/19/2021   Procedure: ESOPHAGOGASTRODUODENOSCOPY (EGD) WITH PROPOFOL;  Surgeon: Tressia Danas, MD;  Location: [facility] ENDOSCOPY;  Service: Gastroenterology;  Laterality: N/A;   ESOPHAGOGASTRODUODENOSCOPY (EGD) WITH PROPOFOL N/A 11/25/2022   Procedure: ESOPHAGOGASTRODUODENOSCOPY (EGD) WITH PROPOFOL;  Surgeon: Tressia Danas, MD;  Location: [facility] ENDOSCOPY;  Service: Gastroenterology;  Laterality: N/A;   GALLBLADDER SURGERY     HERNIA REPAIR     IR TRANSCATHETER BX  06/26/2023   IR US GUIDE VASC ACCESS RIGHT  06/26/2023   IR VENOGRAM HEPATIC W HEMODYNAMIC EVALUATION  06/26/2023   LEFT HEART CATH AND CORS/GRAFTS ANGIOGRAPHY N/A 04/15/2017   Procedure:  Left Heart Cath and Cors/Grafts Angiography;  Surgeon: Lennette Bihari, MD;  Location[facility]VAS[facility] CV LAB;  Service: Cardiovascular;  Laterality: N/A;   LEFT HEART CATH AND CORS/GRAFTS ANGIOGRAPHY N/A 11/02/2020   Procedure: LEFT HEART CATH AND CORS/GRAFTS ANGIOGRAPHY;  Surgeon: Tonny Bollman, MD;  Location: [facility] INVASIVE CV LAB;  Service: Cardiovascular;  Laterality: N/A;   POLYPECTOMY  02/19/2021   Procedure: POLYPECTOMY;  Surgeon: Tressia Danas, MD;  Location: [facility] ENDOSCOPY;  Service: Gastroenterology;;   TEE WITHOUT CARDIOVERSION N/A 12/26/2015   Procedure: TRANSESOPHAGEAL ECHOCARDIOGRAM (TEE);  Surgeon: Purcell Nails, MD;  Location: [facility] OR;  Service: Open Heart Surgery;  Laterality: N/A;   TEE WITHOUT CARDIOVERSION N/A 02/14/2016   Procedure: TRANSESOPHAGEAL ECHOCARDIOGRAM (TEE);  Surgeon: Chrystie Nose, MD;  Location: [facility] ENDOSCOPY;  Service: Cardiovascular;  Laterality: N/A;    Family Hx:  Family History  Problem Relation Age of Onset   CAD Father        4 stents ~ 54   Diabetes Father    Diabetes Maternal Grandfather    Diabetes Maternal Uncle    Heart attack Other    Hyperlipidemia Other     Social History:  reports that he has never smoked. He has never used smokeless tobacco. He reports current alcohol use. He reports that he does not use drugs.  Allergies:  Allergies  Allergen Reactions   Vibra-Tab [Doxycycline] Shortness Of Breath   Glucophage [Metformin] Other (See Comments)    Pt told by his nephrologist not to take this medication due to his kidney function.   Imdur [Isosorbide Dinitrate] Diarrhea   Valtrex [Valacyclovir] Itching   Augmentin [Amoxicillin-Pot Clavulanate] Itching   Tape Rash    Medications: Prior to Admission medications   Medication Sig Start Date End Date Taking? Authorizing Provider  acetaminophen (TYLENOL) 500 MG tablet  Take 1,500 mg by mouth once as needed for moderate pain (pain score 4-6), fever, headache or mild pain (pain score 1-3).    Yes [provider]  allopurinol (ZYLOPRIM) 100 MG tablet TAKE 1 TABLET(100 MG) BY MOUTH DAILY Patient taking differently: Take 100 mg by mouth daily. 10/27/23  Yes Lula Olszewski, MD  amitriptyline (ELAVIL) 25 MG tablet Take 1 tablet (25 mg total) by mouth at bedtime. Patient taking differently: Take 25 mg by mouth at bedtime. 09/14/23  Yes Arnaldo Natal, NP  apixaban (ELIQUIS) 5 MG TABS tablet Take 1 tablet (5 mg total) by mouth 2 (two) times daily. 07/28/23  Yes Chandrasekhar, Mahesh A, MD  Cholecalciferol (VITAMIN D3) 125 MCG (5000 UT) CAPS Take 1 capsule (5,000 Units total) by mouth daily at 12 noon. 07/05/23  Yes Hudnell, Judeth Cornfield, NP  Cyanocobalamin (VITAMIN B-12 PO) Place 1 tablet under the tongue daily.   Yes [provider]  diltiazem (CARDIZEM SR) 120 MG 12 hr capsule Take 1 capsule (120 mg total) by mouth 2 (two) times daily. 08/04/23  Yes Lula Olszewski, MD  Evolocumab (REPATHA SURECLICK) 140 MG/ML SOAJ INJECT THE CONTENTS OF 1 SYRINGE UNDER THE SKIN EVERY 14 DAYS Patient taking differently: Inject 140 mg into the skin every 14 (fourteen) days. INJECT THE CONTENTS OF 1 SYRINGE UNDER THE SKIN EVERY 14 DAYS Next dose is due 11/06/2023. 12/17/22  Yes Pemberton, Kathlynn Grate, MD  famotidine (PEPCID) 20 MG tablet Take 1 tablet (20 mg total) by mouth 2 (two) times daily. 10/28/23  Yes Arnaldo Natal, NP  furosemide (LASIX) 40 MG tablet Take by mouth. 10/22/23  Yes [provider]  JARDIANCE 10 MG TABS tablet TAKE 1 TABLET(10 MG) BY MOUTH DAILY BEFORE BREAKFAST Patient taking differently: Take 10 mg by mouth daily. 10/13/23  Yes Lula Olszewski, MD  losartan (COZAAR) 25 MG tablet Take 1 tablet (25 mg total) by mouth daily. 11/13/22  Yes Lula Olszewski, MD  montelukast (SINGULAIR) 10 MG tablet TAKE 1 TABLET(10 MG) BY MOUTH DAILY Patient taking differently: Take 10 mg by mouth daily. 09/04/23  Yes Lula Olszewski, MD  nitroGLYCERIN (NITROSTAT) 0.4  MG SL tablet Place 1 tablet (0.4 mg total) under the tongue every 5 (five) minutes as needed for chest pain. 02/10/22  Yes Chandrasekhar, Mahesh A, MD  pantoprazole (PROTONIX) 40 MG tablet TAKE 1 TABLET(40 MG) BY MOUTH TWICE DAILY Patient taking differently: Take 40 mg by mouth 2 (two) times daily. 08/18/23  Yes Lula Olszewski, MD  sucralfate (CARAFATE) 1 g tablet TAKE 1 TABLET(1 GRAM) BY MOUTH TWICE DAILY. MAY INCREASE TO 4 TIMES DAILY FOR ANY ACUTE SYMPTOMS Patient taking differently: Take 1 tablet by mouth at bedtime. 11/26/22  Yes Tressia Danas, MD  triamcinolone (NASACORT) 55 MCG/ACT AERO nasal inhaler Place 1 spray into the nose daily. Start with 1 spray each side twice a day for 3 days, then reduce to daily. 10/08/23  Yes Dulce Sellar, NP  Semaglutide,0.25 or 0.5MG /DOS, (OZEMPIC, 0.25 OR 0.5 MG/DOSE,) 2 MG/3ML SOPN Inject 0.25 mg into the skin once a week. Patient taking differently: Inject 0.25 mg into the skin once a week. Waiting for Dr to authorize medication per Insurance, per Patient 11/02/2023. 11/02/23   Lula Olszewski, MD  tirzepatide [facility] 2.5 MG/0.5ML Pen Inject 2.5 mg into the skin once a week. Patient taking differently: Inject 2.5 mg into the skin once a week. Waiting for Dr to get authorization for Insurance per Patient 11/02/2023.  11/02/23   Lula Olszewski, MD    I have reviewed the patient's current medications.  Labs:  Results for orders placed or performed during the hospital encounter of 11/02/23 (from the past 48 hour(s))  Type and screen [facility]     Status: None   Collection Time: 11/02/23  1:30 PM  Result Value Ref Range   ABO/RH(D) A POS    Antibody Screen NEG    Sample Expiration      11/05/2023,2359 Performed at [facility], [address], [place], [place] [address]   Comprehensive metabolic panel     Status: Abnormal   Collection Time: 11/02/23  1:36 PM  Result Value Ref Range   Sodium 140 135 - 145 mmol/L    Potassium 3.9 3.5 - 5.1 mmol/L   Chloride 106 98 - 111 mmol/L   CO2 25 22 - 32 mmol/L   Glucose, Bld 170 (H) 70 - 99 mg/dL    Comment: Glucose reference range applies only to samples taken after fasting for at least 8 hours.   BUN 70 (H) 6 - 20 mg/dL   Creatinine, Ser 1.91 (H) 0.61 - 1.24 mg/dL   Calcium 9.1 8.9 - 47.8 mg/dL   Total Protein 7.1 6.5 - 8.1 g/dL   Albumin 2.8 (L) 3.5 - 5.0 g/dL   AST 57 (H) 15 - 41 U/L   ALT 71 (H) 0 - 44 U/L   Alkaline Phosphatase 188 (H) 38 - 126 U/L   Total Bilirubin 0.7 <1.2 mg/dL   GFR, Estimated 12 (L) >60 mL/min    Comment: (NOTE) Calculated using the CKD-EPI Creatinine Equation (2021)    Anion gap 9 5 - 15    Comment: Performed at [facility] Lab, [address], [place], [place] [address]  CBC     Status: Abnormal   Collection Time: 11/02/23  1:36 PM  Result Value Ref Range   WBC 5.3 4.0 - 10.5 K/uL   RBC 3.49 (L) 4.22 - 5.81 MIL/uL   Hemoglobin 11.3 (L) 13.0 - 17.0 g/dL   HCT 13.0 (L) 86.5 - 78.4 %   MCV 102.6 (H) 80.0 - 100.0 fL   MCH 32.4 26.0 - 34.0 pg   MCHC 31.6 30.0 - 36.0 g/dL   RDW 69.6 29.5 - 28.4 %   Platelets 54 (L) 150 - 400 K/uL    Comment: Immature Platelet Fraction may be clinically indicated, consider ordering this additional test [id number] REPEATED TO VERIFY    nRBC 0.0 0.0 - 0.2 %    Comment: Performed at [facility], [address] [place], [place] [address]  Troponin I (High Sensitivity)     Status: None   Collection Time: 11/02/23  1:36 PM  Result Value Ref Range   Troponin I (High Sensitivity) 3 <18 ng/L    Comment: (NOTE) Elevated high sensitivity troponin I (hsTnI) values and significant  changes across serial measurements may suggest ACS but many other  chronic and acute conditions are known to elevate hsTnI results.  Refer to the "Links" section for chest pain algorithms and additional  guidance. Performed at [facility], [address], [place], [place] [address]   Brain  natriuretic peptide     Status: None   Collection Time: 11/02/23  1:36 PM  Result Value Ref Range   B Natriuretic Peptide 25.4 0.0 - 100.0 pg/mL    Comment: Performed at [facility] Lab, [address],  [place], [place] [address]  POC occult blood, ED     Status: None   Collection Time: 11/02/23  4:06 PM  Result Value Ref Range   Fecal Occult Bld NEGATIVE NEGATIVE  Hemoglobin and hematocrit, blood     Status: Abnormal   Collection Time: 11/02/23  8:58 PM  Result Value Ref Range   Hemoglobin 11.6 (L) 13.0 - 17.0 g/dL   HCT 84.1 (L) 32.4 - 40.1 %    Comment: Performed at [facility] Lab, [address] [place], [place] [address]  CBG monitoring, ED     Status: Abnormal   Collection Time: 11/02/23 10:26 PM  Result Value Ref Range   Glucose-Capillary 123 (H) 70 - 99 mg/dL    Comment: Glucose reference range applies only to samples taken after fasting for at least 8 hours.  CBC     Status: Abnormal   Collection Time: 11/03/23  3:24 AM  Result Value Ref Range   WBC 5.1 4.0 - 10.5 K/uL   RBC 3.37 (L) 4.22 - 5.81 MIL/uL   Hemoglobin 11.3 (L) 13.0 - 17.0 g/dL   HCT 36.6 (L) 44.0 - 34.7 %   MCV 104.7 (H) 80.0 - 100.0 fL   MCH 33.5 26.0 - 34.0 pg   MCHC 32.0 30.0 - 36.0 g/dL   RDW 42.5 95.6 - 38.7 %   Platelets 56 (L) 150 - 400 K/uL    Comment: Immature Platelet Fraction may be clinically indicated, consider ordering this additional test [id number] REPEATED TO VERIFY    nRBC 0.0 0.0 - 0.2 %    Comment: Performed at [facility], [address] [place], [place] [address]  Comprehensive metabolic panel     Status: Abnormal   Collection Time: 11/03/23  3:24 AM  Result Value Ref Range   Sodium 143 135 - 145 mmol/L   Potassium 3.7 3.5 - 5.1 mmol/L   Chloride 111 98 - 111 mmol/L   CO2 23 22 - 32 mmol/L   Glucose, Bld 140 (H) 70 - 99 mg/dL    Comment: Glucose reference range applies only to samples taken after fasting for at least 8 hours.   BUN 65 (H) 6 - 20 mg/dL    Creatinine, Ser 6.60 (H) 0.61 - 1.24 mg/dL   Calcium 8.7 (L) 8.9 - 10.3 mg/dL   Total Protein 6.3 (L) 6.5 - 8.1 g/dL   Albumin 2.6 (L) 3.5 - 5.0 g/dL   AST 53 (H) 15 - 41 U/L   ALT 71 (H) 0 - 44 U/L   Alkaline Phosphatase 185 (H) 38 - 126 U/L   Total Bilirubin 0.6 <1.2 mg/dL   GFR, Estimated 12 (L) >60 mL/min    Comment: (NOTE) Calculated using the CKD-EPI Creatinine Equation (2021)    Anion gap 9 5 - 15    Comment: Performed at [facility] Lab, [address] [place], [place] [address]  Magnesium     Status: None   Collection Time: 11/03/23  3:24 AM  Result Value Ref Range   Magnesium 2.3 1.7 - 2.4 mg/dL    Comment: Performed at [facility] Lab, [address] [place], [place] [address]  Phosphorus     Status: Abnormal   Collection Time: 11/03/23  3:24 AM  Result Value Ref Range   Phosphorus 4.9 (H) 2.5 - 4.6 mg/dL    Comment: Performed at [facility] Lab, [address] [place], [place] [address]  CBG monitoring, ED  Status: Abnormal   Collection Time: 11/03/23  8:22 AM  Result Value Ref Range   Glucose-Capillary 111 (H) 70 - 99 mg/dL    Comment: Glucose reference range applies only to samples taken after fasting for at least 8 hours.  CBG monitoring, ED     Status: None   Collection Time: 11/03/23 12:03 PM  Result Value Ref Range   Glucose-Capillary 98 70 - 99 mg/dL    Comment: Glucose reference range applies only to samples taken after fasting for at least 8 hours.  CBG monitoring, ED     Status: Abnormal   Collection Time: 11/03/23  4:35 PM  Result Value Ref Range   Glucose-Capillary 105 (H) 70 - 99 mg/dL    Comment: Glucose reference range applies only to samples taken after fasting for at least 8 hours.  Urinalysis, Complete w Microscopic -Urine, Clean Catch     Status: Abnormal   Collection Time: 11/03/23  7:15 PM  Result Value Ref Range   Color, Urine YELLOW YELLOW   APPearance CLEAR CLEAR   Specific Gravity, Urine 1.011 1.005 - 1.030   pH 6.0 5.0  - 8.0   Glucose, UA >=500 (A) NEGATIVE mg/dL   Hgb urine dipstick NEGATIVE NEGATIVE   Bilirubin Urine NEGATIVE NEGATIVE   Ketones, ur NEGATIVE NEGATIVE mg/dL   Protein, ur 213 (A) NEGATIVE mg/dL   Nitrite NEGATIVE NEGATIVE   Leukocytes,Ua NEGATIVE NEGATIVE   RBC / HPF 0-5 0 - 5 RBC/hpf   WBC, UA 0-5 0 - 5 WBC/hpf   Bacteria, UA NONE SEEN NONE SEEN   Squamous Epithelial / HPF 0-5 0 - 5 /HPF    Comment: Performed at [facility] Lab, [address], [place], [place] [address]  Glucose, capillary     Status: Abnormal   Collection Time: 11/03/23  9:09 PM  Result Value Ref Range   Glucose-Capillary 167 (H) 70 - 99 mg/dL    Comment: Glucose reference range applies only to samples taken after fasting for at least 8 hours.  CBC     Status: Abnormal   Collection Time: 11/04/23  4:27 AM  Result Value Ref Range   WBC 4.9 4.0 - 10.5 K/uL   RBC 3.15 (L) 4.22 - 5.81 MIL/uL   Hemoglobin 10.2 (L) 13.0 - 17.0 g/dL   HCT 84.6 (L) 96.2 - 95.2 %   MCV 100.3 (H) 80.0 - 100.0 fL   MCH 32.4 26.0 - 34.0 pg   MCHC 32.3 30.0 - 36.0 g/dL   RDW 84.1 32.4 - 40.1 %   Platelets 54 (L) 150 - 400 K/uL    Comment: Immature Platelet Fraction may be clinically indicated, consider ordering this additional test [id number] REPEATED TO VERIFY    nRBC 0.0 0.0 - 0.2 %    Comment: Performed at [facility], [address], [place], [place] [address]  Magnesium     Status: None   Collection Time: 11/04/23  4:27 AM  Result Value Ref Range   Magnesium 2.2 1.7 - 2.4 mg/dL    Comment: Performed at [facility], [address], [place], [place] [address]  Renal function panel     Status: Abnormal   Collection Time: 11/04/23  4:27 AM  Result Value Ref Range   Sodium 141 135 - 145 mmol/L   Potassium 4.0 3.5 - 5.1 mmol/L   Chloride 111 98 - 111 mmol/L   CO2 22 22 - 32 mmol/L   Glucose, Bld 132 (H)  70 - 99 mg/dL    Comment: Glucose reference range applies only to samples taken after fasting for at least 8  hours.   BUN 62 (H) 6 - 20 mg/dL   Creatinine, Ser 1.61 (H) 0.61 - 1.24 mg/dL   Calcium 8.3 (L) 8.9 - 10.3 mg/dL   Phosphorus 4.8 (H) 2.5 - 4.6 mg/dL   Albumin 2.5 (L) 3.5 - 5.0 g/dL   GFR, Estimated 13 (L) >60 mL/min    Comment: (NOTE) Calculated using the CKD-EPI Creatinine Equation (2021)    Anion gap 8 5 - 15    Comment: Performed at [facility] Lab, [address], [place], [place] [address]     ROS:  Pertinent items are noted in HPI.  Physical Exam: Vitals:   11/03/23 2038 11/04/23 0039  BP: 134/62 130/65  Pulse: 81   Resp: 20   Temp: 98.8 F (37.1 C) 98.7 F (37.1 C)  SpO2: 95%      General exam: Appears calm and comfortable  Respiratory system: Clear to auscultation. Respiratory effort normal. No wheezing or crackle Cardiovascular system: S1 & S2 heard, RRR.  1-2+ pitting edema b/l lower extremities to calves Gastrointestinal system: Abdomen is nondistended, soft and nontender. Central nervous system: Alert and oriented. No focal neurological deficits. Psychiatry: Judgement and insight appear normal. Mood & affect appropriate.   Assessment/Plan: AKI on CKD IV - likely prerenal in the setting of oral diuresis prior to admission - improving/stable after NS infusion. UA with some proteinuria and glucosuria. Good urine output with no symptoms of uremia. Still slightly hypervolemic on exam.  Upon shared decision making will switch to torsemide 10mg  daily and arrange close outpatient renal follow up. Continue to hold ARB and SGLT2 upon discharge. Stable for discharge from renal standpoint   Holley Wirt 11/04/2023, 7:56 AM  FM PGY2 [facility].

## 2023-11-04 NOTE — Discharge Instructions (Signed)
  Follow with Primary MD Lula Olszewski, MD in 2-3 days   Get CBC, CMP, 2 view Chest X ray -  checked next visit with your primary MD    Activity: As tolerated with Full fall precautions use walker/cane & assistance as needed  Disposition Home    Diet: Renal diet with 1.5 L fluid restriction per day  Special Instructions: If you have smoked or chewed Tobacco  in the last 2 yrs please stop smoking, stop any regular Alcohol  and or any Recreational drug use.  On your next visit with your primary care physician please Get Medicines reviewed and adjusted.  Please request your Prim.MD to go over all Hospital Tests and Procedure/Radiological results at the follow up, please get all Hospital records sent to your Prim MD by signing hospital release before you go home.  If you experience worsening of your admission symptoms, develop shortness of breath, life threatening emergency, suicidal or homicidal thoughts you must seek medical attention immediately by calling 911 or calling your MD immediately  if symptoms less severe.  You Must read complete instructions/literature along with all the possible adverse reactions/side effects for all the Medicines you take and that have been prescribed to you. Take any new Medicines after you have completely understood and accpet all the possible adverse reactions/side effects.   Do not drive when taking Pain medications.  Do not take more than prescribed Pain, Sleep and Anxiety Medications

## 2023-11-05 ENCOUNTER — Telehealth: Payer: Self-pay

## 2023-11-05 NOTE — Transitions of Care (Post Inpatient/ED Visit) (Signed)
11/05/2023  Name: Sean [name] MRN: [id number] DOB: February 20, 1971  Today's TOC FU Call Status: Today's TOC FU Call Status:: Successful TOC FU Call Completed TOC FU Call Complete Date: 11/05/23 Patient's Name and Date of Birth confirmed.  Transition Care Management Follow-up Telephone Call Date of Discharge: 11/04/23 Discharge Facility: [facility]) Type of Discharge: Inpatient Admission Primary Inpatient Discharge Diagnosis:: "AKI" How have you been since you were released from the hospital?: Better Any questions or concerns?: No  Items Reviewed: Did you receive and understand the discharge instructions provided?: Yes Medications obtained,verified, and reconciled?: Yes (Medications Reviewed) Any new allergies since your discharge?: No Dietary orders reviewed?: Yes Type of Diet Ordered:: low salt/heart  healthy Do you have support at home?: Yes People in Home: alone Name of Support/Comfort Primary Source: has family nearby to assist if needed  Medications Reviewed Today: Medications Reviewed Today     Reviewed by Charlyn Minerva, RN (Registered Nurse) on 11/05/23 at [id number]  Med List Status: <None>   Medication Order Taking? Sig Documenting Provider Last Dose Status Informant  acetaminophen (TYLENOL) 500 MG tablet [id number] Yes Take 1,500 mg by mouth once as needed for moderate pain (pain score 4-6), fever, headache or mild pain (pain score 1-3). [provider] Taking Active Self, Pharmacy Records           Med Note (COFFELL, Otis Brace Jun 17, 2023  4:53 PM) 1500mg  yesterday.  allopurinol (ZYLOPRIM) 100 MG tablet [id number] Yes Take 1 tablet (100 mg total) by mouth daily. Leroy Sea, MD Taking Active   amitriptyline (ELAVIL) 25 MG tablet [id number] Yes Take 1 tablet (25 mg total) by mouth at bedtime.  Patient taking differently: Take 25 mg by mouth at bedtime.   Arnaldo Natal, NP Taking Active Self, Pharmacy Records  apixaban  (ELIQUIS) 5 MG TABS tablet [id number] Yes Take 1 tablet (5 mg total) by mouth 2 (two) times daily. Christell Constant, MD Taking Active Self, Pharmacy Records  Cholecalciferol (VITAMIN D3) 125 MCG (5000 UT) CAPS [id number] Yes Take 1 capsule (5,000 Units total) by mouth daily at 12 noon. Dulce Sellar, NP Taking Active Self, Pharmacy Records  Cyanocobalamin (VITAMIN B-12 PO) [id number] Yes Place 1 tablet under the tongue daily. [provider] Taking Active Self, Pharmacy Records  diltiazem (CARDIZEM SR) 120 MG 12 hr capsule [id number] Yes Take 1 capsule (120 mg total) by mouth 2 (two) times daily. Lula Olszewski, MD Taking Active Self, Pharmacy Records  docusate sodium (COLACE) 100 MG capsule [id number] Yes Take 2 capsules (200 mg total) by mouth 2 (two) times daily. Leroy Sea, MD Taking Active   Evolocumab [facility] SURECLICK) 140 MG/ML Ivory Broad [id number] Yes INJECT THE CONTENTS OF 1 SYRINGE UNDER THE SKIN EVERY 14 DAYS  Patient taking differently: Inject 140 mg into the skin every 14 (fourteen) days. INJECT THE CONTENTS OF 1 SYRINGE UNDER THE SKIN EVERY 14 DAYS Next dose is due 11/06/2023.   Meriam Sprague, MD Taking Active Self, Pharmacy Records  famotidine (PEPCID) 20 MG tablet [id number] Yes Take 1 tablet (20 mg total) by mouth 2 (two) times daily. Arnaldo Natal, NP Taking Active Self, Pharmacy Records  hydrocortisone [facility]) 2.5 % rectal cream [id number] Yes Apply rectally 2 (two) times daily. Leroy Sea, MD Taking Active   JARDIANCE 10 MG TABS tablet [id number] Yes TAKE 1 TABLET(10 MG) BY MOUTH DAILY BEFORE BREAKFAST  Patient taking differently: Take 10 mg by mouth daily.  Lula Olszewski, MD Taking Active Self, Pharmacy Records  montelukast (SINGULAIR) 10 MG tablet [id number] Yes TAKE 1 TABLET(10 MG) BY MOUTH DAILY  Patient taking differently: Take 10 mg by mouth daily.   Lula Olszewski, MD Taking Active Self, Pharmacy Records  nitroGLYCERIN  (NITROSTAT) 0.4 MG SL tablet [id number] Yes Place 1 tablet (0.4 mg total) under the tongue every 5 (five) minutes as needed for chest pain. Christell Constant, MD Taking Active Self, Pharmacy Records           Med Note (COFFELL, Marzella Schlein   Wed Jun 17, 2023  5:00 PM) Pt states the tablet formulation did not help him but the IV solution was effective.  pantoprazole (PROTONIX) 40 MG tablet [id number] Yes TAKE 1 TABLET(40 MG) BY MOUTH TWICE DAILY  Patient taking differently: Take 40 mg by mouth 2 (two) times daily.   Lula Olszewski, MD Taking Active Self, Pharmacy Records  polyethylene glycol [facility] / GLYCOLAX) 17 g packet [id number] Yes Mix 17 grams (1 packet) in 8 ounces of fluid and take by mouth daily. Leroy Sea, MD Taking Active   Semaglutide,0.25 or 0.5MG /DOS, (OZEMPIC, 0.25 OR 0.5 MG/DOSE,) 2 MG/3ML SOPN [id number] No Inject 0.25 mg into the skin once a week.  Patient not taking: Reported on 11/05/2023   Lula Olszewski, MD Not Taking Active Self, Pharmacy Records  sucralfate (CARAFATE) 1 g tablet [id number] Yes TAKE 1 TABLET(1 GRAM) BY MOUTH TWICE DAILY. MAY INCREASE TO 4 TIMES DAILY FOR ANY ACUTE SYMPTOMS  Patient taking differently: Take 1 tablet by mouth at bedtime.   Tressia Danas, MD Taking Active Self, Pharmacy Records  tirzepatide [facility]) 2.5 MG/0.5ML Pen [id number] No Inject 2.5 mg into the skin once a week.  Patient not taking: Reported on 11/05/2023   Lula Olszewski, MD Not Taking Active Self, Pharmacy Records  torsemide [facility]) 10 MG tablet [id number] Yes Take 1 tablet (10 mg total) by mouth daily. Leroy Sea, MD Taking Active   triamcinolone (NASACORT) 55 MCG/ACT AERO nasal inhaler [id number] Yes Place 1 spray into the nose daily. Start with 1 spray each side twice a day for 3 days, then reduce to daily. Dulce Sellar, NP Taking Active Self, Pharmacy Records            Home Care and Equipment/Supplies: Were Home Health Services Ordered?:  NA Any new equipment or medical supplies ordered?: NA  Functional Questionnaire: Do you need assistance with bathing/showering or dressing?: No Do you need assistance with meal preparation?: No Do you need assistance with eating?: No Do you have difficulty maintaining continence: No Do you need assistance with getting out of bed/getting out of a chair/moving?: No Do you have difficulty managing or taking your medications?: No  Follow up appointments reviewed: PCP Follow-up appointment confirmed?: Yes Date of PCP follow-up appointment?: 11/09/23 Follow-up Provider: Dr. Jon [facility] Follow-up appointment confirmed?: Yes Date of Specialist follow-up appointment?: 11/12/23 Follow-Up Specialty Provider:: Dr. Rosita Kea Do you need transportation to your follow-up appointment?: No Do you understand care options if your condition(s) worsen?: Yes-patient verbalized understanding  SDOH Interventions Today    Flowsheet Row Most Recent Value  SDOH Interventions   Food Insecurity Interventions Intervention Not Indicated  Housing Interventions Intervention Not Indicated  Transportation Interventions Intervention Not Indicated  Utilities Interventions Intervention Not Indicated      TOC interventions discussed/reviewed: -Discussed/reviewed insurance/health plans benefits -Doctor visit discussed/reviewed -PCP -Doctor visits discussed/reviewed-Specialist -Provided Verbal Education: 30-day TOC program, nutrition, meds & their  functions, symptom mgmt., fall/safety measures in the home -Disease mgmt. discussed/reviewed: PAF,HTN   Antionette Fairy, RN,BSN,CCM RN Care Manager Transitions of Care  [place]  Direct Phone: [phone] Toll Free: [phone] Fax: [phone]

## 2023-11-06 ENCOUNTER — Other Ambulatory Visit: Payer: Self-pay | Admitting: Internal Medicine

## 2023-11-06 NOTE — Telephone Encounter (Signed)
The patient was prescribed Zepbound (tirzepatide), which does not share the same FDA-approved indication as Wegovy/Ozempic (semaglutide) for reducing the risk of major adverse cardiovascular events (cardiovascular death, non-fatal myocardial infarction, or non-fatal stroke) in adults with established cardiovascular disease and obesity or overweight. As a result, there are no grounds for an appeal. If clinically appropriate, you may consider prescribing Wegovy for this patient instead.  Thank you, Dellie Burns, PharmD Clinical Pharmacist  [place]  Direct Dial: [phone]

## 2023-11-09 ENCOUNTER — Telehealth: Payer: Self-pay

## 2023-11-09 ENCOUNTER — Other Ambulatory Visit (HOSPITAL_COMMUNITY): Payer: Self-pay

## 2023-11-09 ENCOUNTER — Encounter (INDEPENDENT_AMBULATORY_CARE_PROVIDER_SITE_OTHER): Payer: Self-pay | Admitting: Otolaryngology

## 2023-11-09 ENCOUNTER — Ambulatory Visit (INDEPENDENT_AMBULATORY_CARE_PROVIDER_SITE_OTHER): Payer: BC Managed Care – PPO | Admitting: Internal Medicine

## 2023-11-09 ENCOUNTER — Ambulatory Visit
Admission: RE | Admit: 2023-11-09 | Discharge: 2023-11-09 | Disposition: A | Discharge status: Home / Self Care | Payer: BC Managed Care – PPO | Source: Ambulatory Visit | Attending: Internal Medicine

## 2023-11-09 ENCOUNTER — Encounter: Payer: Self-pay | Admitting: Internal Medicine

## 2023-11-09 VITALS — BP 147/77 | HR 70 | Temp 98.3°F | Ht 71.0 in | Wt 394.4 lb

## 2023-11-09 DIAGNOSIS — R5381 Other malaise: Secondary | ICD-10-CM | POA: Diagnosis not present

## 2023-11-09 DIAGNOSIS — E8770 Fluid overload, unspecified: Secondary | ICD-10-CM | POA: Diagnosis not present

## 2023-11-09 DIAGNOSIS — I2510 Atherosclerotic heart disease of native coronary artery without angina pectoris: Secondary | ICD-10-CM

## 2023-11-09 DIAGNOSIS — N179 Acute kidney failure, unspecified: Secondary | ICD-10-CM | POA: Diagnosis not present

## 2023-11-09 DIAGNOSIS — R5383 Other fatigue: Secondary | ICD-10-CM

## 2023-11-09 DIAGNOSIS — N184 Chronic kidney disease, stage 4 (severe): Secondary | ICD-10-CM | POA: Diagnosis not present

## 2023-11-09 DIAGNOSIS — R9389 Abnormal findings on diagnostic imaging of other specified body structures: Secondary | ICD-10-CM | POA: Diagnosis not present

## 2023-11-09 DIAGNOSIS — R748 Abnormal levels of other serum enzymes: Secondary | ICD-10-CM

## 2023-11-09 DIAGNOSIS — I252 Old myocardial infarction: Secondary | ICD-10-CM

## 2023-11-09 DIAGNOSIS — K7210 Chronic hepatic failure without coma: Secondary | ICD-10-CM

## 2023-11-09 DIAGNOSIS — K219 Gastro-esophageal reflux disease without esophagitis: Secondary | ICD-10-CM

## 2023-11-09 DIAGNOSIS — K649 Unspecified hemorrhoids: Secondary | ICD-10-CM

## 2023-11-09 LAB — CBC WITH DIFFERENTIAL/PLATELET
Basophils Absolute: 0.1 10*3/uL (ref 0.0–0.1)
Basophils Relative: 1.1 % (ref 0.0–3.0)
Eosinophils Absolute: 0.3 10*3/uL (ref 0.0–0.7)
Eosinophils Relative: 5.5 % — ABNORMAL HIGH (ref 0.0–5.0)
HCT: 34.3 % — ABNORMAL LOW (ref 39.0–52.0)
Hemoglobin: 11.7 g/dL — ABNORMAL LOW (ref 13.0–17.0)
Lymphocytes Relative: 25.4 % (ref 12.0–46.0)
Lymphs Abs: 1.2 10*3/uL (ref 0.7–4.0)
MCHC: 34.2 g/dL (ref 30.0–36.0)
MCV: 100.1 fL — ABNORMAL HIGH (ref 78.0–100.0)
Monocytes Absolute: 0.5 10*3/uL (ref 0.1–1.0)
Monocytes Relative: 11.2 % (ref 3.0–12.0)
Neutro Abs: 2.7 10*3/uL (ref 1.4–7.7)
Neutrophils Relative %: 56.8 % (ref 43.0–77.0)
Platelets: 66 10*3/uL — ABNORMAL LOW (ref 150.0–400.0)
RBC: 3.42 Mil/uL — ABNORMAL LOW (ref 4.22–5.81)
RDW: 15.3 % (ref 11.5–15.5)
WBC: 4.8 10*3/uL (ref 4.0–10.5)

## 2023-11-09 LAB — COMPREHENSIVE METABOLIC PANEL
ALT: 45 U/L (ref 0–53)
AST: 35 U/L (ref 0–37)
Albumin: 3.3 g/dL — ABNORMAL LOW (ref 3.5–5.2)
Alkaline Phosphatase: 199 U/L — ABNORMAL HIGH (ref 39–117)
BUN: 66 mg/dL — ABNORMAL HIGH (ref 6–23)
CO2: 23 meq/L (ref 19–32)
Calcium: 8.9 mg/dL (ref 8.4–10.5)
Chloride: 107 meq/L (ref 96–112)
Creatinine, Ser: 5.04 mg/dL (ref 0.40–1.50)
GFR: 12.57 mL/min — CL (ref >60.00)
Glucose, Bld: 159 mg/dL — ABNORMAL HIGH (ref 70–99)
Potassium: 3.9 meq/L (ref 3.5–5.1)
Sodium: 141 meq/L (ref 135–145)
Total Bilirubin: 0.7 mg/dL (ref 0.2–1.2)
Total Protein: 6.8 g/dL (ref 6.0–8.3)

## 2023-11-09 LAB — URINALYSIS, ROUTINE W REFLEX MICROSCOPIC
Bilirubin Urine: NEGATIVE
Ketones, ur: NEGATIVE
Leukocytes,Ua: NEGATIVE
Nitrite: NEGATIVE
Specific Gravity, Urine: 1.015 (ref 1.000–1.030)
Total Protein, Urine: 100 — AB
Urine Glucose: >=1000 — AB
Urobilinogen, UA: 0.2 (ref 0.0–1.0)
pH: 6 (ref 5.0–8.0)

## 2023-11-09 MED ORDER — PEPCID COMPLETE 10-800-165 MG PO CHEW: 1.0000 | CHEWABLE_TABLET | Freq: Two times a day (BID) | ORAL | 11 refills | Status: DC | PRN

## 2023-11-09 NOTE — Telephone Encounter (Signed)
Noted  

## 2023-11-09 NOTE — Assessment & Plan Note (Signed)
Bordering stage 5 We had elevated bun/creatinine today but better than hospital He will see nephrology in 2 days and hold torsemide and drink fluid since swelling down.

## 2023-11-09 NOTE — Patient Instructions (Addendum)
VISIT SUMMARY:  Today, we discussed your recent health concerns, including your kidney disease, recent hospitalization for dehydration, and your ongoing weight loss and heartburn treatments. We also talked about your occupational stress and your commitment to making lifestyle changes to avoid dialysis and maintain your health.  YOUR PLAN:  -OBESITY: Obesity is a condition where excess body fat negatively affects your health. You are currently on a weight loss medication, which will help reduce your appetite and aid in weight loss. This medication will increase in dose after four weeks. We discussed the importance of monitoring for side effects like nausea and vomiting, and you should have nausea medication on hand. Please call the office with the exact name of your medication so we can send a prescription for the higher dose.  -MYOCARDIAL INFARCTION: A myocardial infarction, or heart attack, occurs when blood flow to the heart is blocked. Continuing your weight loss medication can help prevent another heart attack by reducing your weight. We will monitor your progress and adjust the medication as needed.  -ACUTE KIDNEY INJURY: Acute kidney injury is a sudden episode of kidney failure or damage. Your recent hospitalization was due to dehydration from increased diuretic use. We discussed the importance of monitoring your fluid status and adjusting your diuretic use. We will order kidney function tests and share the results with your nephrologist. Follow up with Dr. Zetta Bills and consider restarting losartan based on his advice.  -HEMORRHOIDS: Hemorrhoids are swollen veins in the lower rectum that can cause bleeding and discomfort. Your hemorrhoid is likely exacerbated by dehydration and straining. We discussed surgical options if symptoms worsen. We will monitor your symptoms and consider surgical intervention if necessary.  -GENERAL HEALTH MAINTENANCE: Staying up-to-date with vaccinations is important  to prevent illnesses that could worsen your kidney issues. We recommend flu, COVID-19, and pneumonia vaccines. Please discuss your vaccination status with your pharmacist.  INSTRUCTIONS:  We will order a chest x-ray, blood count, and kidney function tests. Please follow up in one month or sooner if your symptoms worsen.    Please go to our [facility] office to get your xrays done. You can walk in M-F between 8:30am- noon or 1pm - 5pm. Tell them you are there for xrays ordered by me. They will send me the results, then I will let you know the results with instructions.   Address: [address].  The Xray department is located in the basement.    Call me back about calling higher dose of weight loss medication(s)   Nephrologist: Estanislado Emms, MD  Medicines:     ARB/ACEI:  not yet discussed    SGLT2 inhibitor:   not yet discussed  Imaging:  Labwork:  Creatinine  Date Value  07/27/2023 3.2 (A)  04/10/2023 3.0 (A)  04/03/2023 3.02 mg/dL (H)  86/57/8469 6.29 mg/dL (H)  52/84/1324 2.4 (A)  03/24/2022 2.1 (A)   Creat (mg/dL)  Date Value  40/08/2724 1.95 (H)  12/06/2021 2.15 (H)  11/26/2016 1.44 (H)  05/26/2016 1.31   Creatinine, Ser (mg/dL)  Date Value  36/64/4034 5.25 (H)  11/03/2023 5.44 (H)  11/02/2023 5.63 (H)  06/26/2023 3.81 (H)  06/19/2023 3.76 (H)  06/17/2023 3.35 (H)  09/30/2022 2.51 (H)  09/24/2022 2.66 (H)  09/03/2022 2.95 (H)  08/25/2022 3.60 (H)  04/24/2022 1.98 (H)  03/19/2022 1.97 (H)  03/03/2022 1.96 (H)  08/13/2021 1.80 (H)  01/02/2021 1.60 (H)  10/31/2020 1.64 (H)  10/05/2020 1.67 (H)  08/13/2020 1.42 (  H)  09/14/2019 1.38 (H)  12/08/2018 1.68 (H)  02/08/2018 1.65 (H)  08/21/2017 1.48 (H)  05/14/2017 1.43 (H)  04/07/2017 1.49 (H)  12/29/2016 1.44 (H)  02/26/2016 1.36 (H)  02/14/2016 1.34 (H)  02/13/2016 1.46 (H)  02/12/2016 1.26 (H)  02/11/2016 1.42 (H)  12/30/2015 1.38 (H)  12/29/2015 1.82 (H)  12/28/2015 1.54 (H)   12/27/2015 1.40 (H)  12/27/2015 1.56 (H)  12/27/2015 1.38 (H)  12/26/2015 1.25 (H)  12/26/2015 1.10  12/26/2015 1.20  12/26/2015 1.10  12/26/2015 1.00  12/26/2015 1.31 (H)  12/25/2015 1.20  12/24/2015 1.30 (H)  12/23/2015 1.47 (H)  06/10/2011 1.24  04/23/2011 1.07   GFR (mL/min)  Date Value  09/30/2022 29.24 (L)  09/24/2022 27.27 (L)  09/03/2022 24.10 (L)  08/25/2022 18.98 (L)  04/24/2022 38.98 (L)  03/19/2022 39.25 (L)  03/03/2022 39.50 (L)  01/02/2021 50.80 (L)   eGFR (mL/min/1.73)  Date Value  08/13/2021 46 (L)   EGFR (no units)  Date Value  10/23/2023 16.0  04/10/2023 22.0  03/11/2023 27.0  03/02/2023 27.0    Lab Results  Component Value Date   VD25OH 19.91 (L) 07/02/2023   NA 141 11/04/2023   K 4.0 11/04/2023   CL 111 11/04/2023   CO2 22 11/04/2023   CALCIUM 8.3 (L) 11/04/2023   PROT 6.3 (L) 11/03/2023   ALBUMIN 2.5 (L) 11/04/2023   HGB 10.2 (L) 11/04/2023   HCT 31.6 (L) 11/04/2023   ANA POSITIVE (A) 04/24/2022   HGBA1C 5.6 06/17/2023   TOTALPROTELP 6.0 07/27/2023   TOTALPROTELP 6.4 07/27/2023   ALBUMINELP 3.2 07/27/2023   A1GS 0.3 07/27/2023   A2GS 0.6 07/27/2023   BETS 1.7 (H) 07/27/2023   GAMS 0.7 07/27/2023   MSPIKE 1.0 (H) 07/27/2023   SPEI Comment 07/27/2023   Lab Results  Component Value Date   COLORURINE YELLOW 11/03/2023   APPEARANCEUR CLEAR 11/03/2023   LABSPEC 1.011 11/03/2023   PHURINE 6.0 11/03/2023   GLUCOSEU >=500 (A) 11/03/2023   HGBUR NEGATIVE 11/03/2023   BILIRUBINUR NEGATIVE 11/03/2023   KETONESUR NEGATIVE 11/03/2023   PROTEINUR 100 (A) 11/03/2023   UROBILINOGEN 0.2 09/03/2022   NITRITE NEGATIVE 11/03/2023   LEUKOCYTESUR NEGATIVE 11/03/2023    Symptoms to Watch For: changes in urination, swelling, fatigue, chest pain, or shortness of breath  Medicines to Avoid:  diuretics (fluid pills), NSAIDs, proton pump inhibitor (PPI) stomach acid reducer, contrasted imaging studies, aminoglycosides, acyclovir,  phosphate-based bowel prep, baclofen  Lifestyle and Nutrition:  Limit sodium, potassium, phosphorus and protein intake, consider nutrition consult, smoking avoidance, exercise regularly, manage weight, and reduce stress, control blood sugar, avoid dehydration (may need early emergency room visits for nausea vomiting diarrhea)  Vaccinations: flu/COVID/pneumonia/hepatitis B vaccinations are more important in kidney disease

## 2023-11-09 NOTE — Progress Notes (Signed)
[facility]: [phone]   -- Medical Office Visit --  Patient:  Sean [name]      Age: 52 y.o.       Sex:  male  Date:   11/09/2023 Today's Healthcare Provider: Lula Olszewski, MD  ==========================================================================   Assessment & Plan AKI (acute kidney injury) [facility]) Acute Kidney Injury   He was recently hospitalized for acute kidney injury, likely due to increased diuretic use and dehydration, but has since recovered. He is currently on torsemide for fluid management. We discussed the importance of monitoring fluid status and the potential need to adjust diuretic use. We explained that dehydration from diuretics could lead to toxin buildup, causing nausea and vomiting, which can be mistaken for GI bleeding. We will order kidney function tests, monitor fluid status and adjust the diuretic as needed, and share lab results with his nephrologist. He will follow up with his nephrologist, Dr. Zetta Bills, and consider restarting losartan based on the nephrologist's advice. Alkaline phosphatase elevation  Chronic kidney disease, stage 4 (severe) (HCC) Bordering stage 5 We had elevated bun/creatinine today but better than hospital He will see nephrology in 2 days and hold torsemide and drink fluid since swelling down. Malaise and fatigue  Chronic liver failure without hepatic coma (HCC)  Gastroesophageal reflux disease without esophagitis  Coronary arteriosclerosis in patient with history of previous myocardial infarction Myocardial Infarction   He has a history of myocardial infarction with no current heart failure. The weight loss medication may help prevent another myocardial infarction and heart failure by reducing weight. We will continue the current weight loss medication to prevent another myocardial infarction. Morbid obesity (HCC) Obesity   He is on weight loss medication, likely semaglutide or  tirzepatide, currently at 0.25 mg weekly, which will increase to 0.5 mg weekly after four weeks. This medication aims to reduce appetite and aid in weight loss, potentially preventing another myocardial infarction by reducing weight. We discussed the risks of nausea, vomiting, dehydration, and kidney issues, emphasizing the importance of having nausea medication on hand and monitoring for side effects. We will send a prescription for a higher dose of the current weight loss medication. He must call the office with the exact name of the medication for the higher dose prescription. We will monitor for side effects and adjust the dose as needed, ensuring he has nausea medication on hand. Hemorrhoids, unspecified hemorrhoid type Hemorrhoids   He has a documented hemorrhoid causing occasional bleeding, likely exacerbated by dehydration and straining, but the condition is not currently bothersome. We discussed surgical intervention options if symptoms worsen, including banding procedures. We will monitor for symptoms and consider surgical intervention if symptoms worsen.      Orders Placed During this Encounter:   ED Discharge Orders          Ordered    DG Chest 2 View       Comments: Specimen [id number]: Team Member Role and Specialty Contact Info Address [address] [place]  all results to Estanislado Emms, MD Consulting Physician (Nephrology) Phone: [phone]: [phone] [address] [place] [address] 04/23/2023 - -    11/09/23 1213    CBC with Differential/Platelet       Comments: Specimen [id number]: Team Member Role and Specialty Contact Info Address [address] [place]  all results to Estanislado Emms, MD Consulting Physician (Nephrology) Phone: [phone]: [phone] [address]  [address] 04/23/2023 - -    11/09/23 1213  Comp Met (CMET)       Comments: Specimen [id number]: Team Member Role and Specialty Contact Info Address [address]  all results to Estanislado Emms, MD Consulting Physician (Nephrology) Phone: [phone]: [phone] [address] [place] [address] 04/23/2023 - -    11/09/23 1213    Protein / creatinine ratio, urine       Comments: Team Member Role and Specialty Contact Info Address [address] [place]  all results to Estanislado Emms, MD Consulting Physician (Nephrology) Phone: [phone]: [phone] [address] [place] [address] 04/23/2023 - -    11/09/23 1213    Urinalysis, Routine w reflex microscopic       Comments: Specimen [id number]: Team Member Role and Specialty Contact Info Address [address] [place]  all results to Estanislado Emms, MD Consulting Physician (Nephrology) Phone: [phone]: [phone] [address] [place] [address] 04/23/2023 - -    11/09/23 1213    famotidine-calcium carbonate-magnesium hydroxide (PEPCID COMPLETE) [phone] MG chewable tablet  2 times daily PRN        11/09/23 1222          General Health Maintenance   He needs to stay up-to-date with vaccinations to prevent illnesses that could exacerbate kidney issues. We discussed the importance of flu, COVID-19, and pneumonia vaccines. We recommend flu, COVID-19, and pneumonia vaccines and will discuss vaccination status with the pharmacist.  Follow-up   We will order a chest x-ray, blood count, and kidney function tests. He will follow up in one month or sooner if symptoms worsen.  Future Appointments  Date Time Provider Department Center  11/12/2023  2:00 PM [facility] [place] [facility] 1 [facility]  12/14/2023 10:40 AM Lula Olszewski, MD [facility]  01/01/2024  4:00 PM Christell Constant, MD [facility]  01/18/2024  8:30 AM [facility] None  01/29/2024  4:30 PM Weston Settle, MD [facility] None  Patient Care Team: Lula Olszewski, MD as PCP - General (Internal Medicine) Christell Constant, MD as PCP - Cardiology (Cardiology) Weston Settle, MD as Consulting Physician (Oncology) Alejandro Mulling, RN as [facility] Care Management Tressia Danas, MD (Inactive) as Consulting Physician (Gastroenterology) Estanislado Emms, MD as Consulting Physician (Nephrology)    SUBJECTIVE: 52 y.o. male who has History of umbilical hernia repair; Hypertension; Hyperlipidemia; Thrombocytopenia (HCC); Sinus bradycardia; CAD (coronary artery disease); Atrial flutter with rapid ventricular response (HCC); Morbid obesity (HCC); Chronic kidney disease, stage 4 (severe) (HCC); Other long term (current) drug therapy; History of coronary artery bypass surgery; Obstructive sleep apnea syndrome; Chronic gout of multiple sites; Alkaline phosphatase elevation; Ectatic aorta (HCC); Gastroesophageal reflux disease without esophagitis; Herpes simplex; Metabolic dysfunction-associated steatohepatitis (MASH); Prediabetes; Statins contraindicated; Hypothyroid; Chronic venous stasis dermatitis of both lower extremities; Nausea and vomiting; Chronic liver failure without hepatic coma (HCC); Splenomegaly; Spondylolisthesis; Bleeding diathesis (HCC); Malaise and fatigue; Allergic rhinitis due to pollen; Amitriptyline adverse reaction; MGUS (monoclonal gammopathy of unknown significance); 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  Main reasons for visit/main concerns/chief complaint: One week follow-up    AI-Extracted: Discussed the use of AI scribe software for clinical note transcription with the patient, who gave verbal consent to proceed.  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 The patient reported a recent episode of nausea and vomiting, which he attributed to an increase in  his diuretic medication. This episode led to significant dehydration, necessitating hospital admission for intravenous fluid administration. The patient also  reported a single episode of rectal bleeding, which was attributed to a known hemorrhoid that occasionally flares, particularly during periods of dehydration.  The patient has been on a weight loss medication, the name of which was not specified in the conversation. He reported taking his first dose without any adverse effects and is scheduled to increase the dose in four weeks. The patient also mentioned being on a Proton Pump Inhibitor (PPI) for chronic heartburn, which he experiences even with water intake.  The patient's kidney function was a significant concern during the conversation. He reported an upcoming appointment with a nephrologist, with the possibility of switching to a different specialist within the same practice. The patient expressed a strong desire to avoid dialysis and seemed committed to making necessary lifestyle changes to preserve kidney function.  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 Despite these challenges, the patient expressed a strong work ethic and a desire to continue working. 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  In summary, the patient is a truck driver with kidney disease, recently hospitalized for dehydration secondary to increased diuretic use and an episode of vomiting. He is on a weight loss medication and a PPI for chronic heartburn. The patient is actively engaged in his care, with a strong desire to avoid dialysis and continue working despite occupational stressors.   Note that patient  has a past medical history of Abnormal liver enzymes, Cardiac cirrhosis, Chronic combined systolic and diastolic CHF (congestive heart failure) (HCC), CKD (chronic kidney disease) stage 3, GFR 30-59 ml/min (HCC) (01/04/2016), CKD (chronic kidney disease), stage II, Congenital heart  defect, Coronary artery disease, Gastric polyps (11/25/2022), Gastritis and gastroduodenitis (11/25/2022), Gastroesophageal reflux disease with esophagitis and hemorrhage (04/04/2021), Hematuria, History of umbilical hernia repair (07/10/2011), Hypercholesteremia, Hypertension, Incisional hernia (07/10/2011), Morbid obesity (HCC), Morbid obesity with BMI of 50.0-59.9, adult (HCC) (01/04/2016), Nausea and vomiting (11/25/2022), OSA on CPAP, Paroxysmal atrial flutter (HCC) (02/12/2016), 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  Problem list overviews that were updated at today's visit: Problem  Aki (Acute Kidney Injury) (Hcc)  Morbid Obesity (Hcc)   Ran out of zepbound Metformin contraindicated by chronic stage 4 kidney disease Stimulants & bariatrics relatively contraindicated by coronary artery disease  Wellbutrin- wants to avoid Topiramate - haven't tried  Lab Results  Component Value Date   HGBA1C 5.2 09/30/2022   Lab Results  Component Value Date   TSH 2.96 09/03/2022    Wt Readings from Last 10 Encounters:  05/04/23 (!) 374 lb (169.6 kg)  04/30/23 (!) 371 lb 4.8 oz (168.4 kg)  04/16/23 (!) 360 lb (163.3 kg)  04/15/23 (!) 366 lb 6.4 oz (166.2 kg)  04/03/23 (!) 362 lb 3.2 oz (164.3 kg)  03/02/23 (!) 360 lb 6.4 oz (163.5 kg)  02/17/23 (!) 362 lb (164.2 kg)  01/26/23 (!) 363 lb 14.4 oz (165.1 kg)  01/26/23 (!) 363 lb 12.8 oz (165 kg)  12/30/22 (!) 367 lb 12.8 oz (166.8 kg)   BMI Readings from Last 1 Encounters:  05/04/23 52.16 kg/m   No history bariatrics Complicated by  elevated alk phos status post cholecystecomy  Complicated by umbilical hernia Complicated by gout Complicated by coronary artery  disease status post cabg Complicated by gastritis Complicated by chronic kidney disease 3-4 Complicated by obstructive sleep apnea  Complicated by aflutter     Med reconciliation: Current Outpatient Medications on File Prior to Visit   Medication Sig   acetaminophen (TYLENOL) 500 MG tablet Take 1,500 mg by mouth once as needed for moderate pain (pain score 4-6), fever, headache or mild pain (pain score 1-3).   [START ON 11/11/2023] allopurinol (ZYLOPRIM) 100 MG tablet Take 1 tablet (100 mg total) by mouth daily.   amitriptyline (ELAVIL) 25 MG tablet Take 1 tablet (25 mg total) by mouth at bedtime. (Patient taking differently: Take 25 mg by mouth at bedtime.)   apixaban (ELIQUIS) 5 MG TABS tablet Take 1 tablet (5 mg total) by mouth 2 (two) times daily.   cefdinir (OMNICEF) 300 MG capsule Take 300 mg by mouth 2 (two) times daily.   Cholecalciferol (VITAMIN D3) 125 MCG (5000 UT) CAPS Take 1 capsule (5,000 Units total) by mouth daily at 12 noon.   Cyanocobalamin (VITAMIN B-12 PO) Place 1 tablet under the tongue daily.   diltiazem (CARDIZEM SR) 120 MG 12 hr capsule Take 1 capsule (120 mg total) by mouth 2 (two) times daily.   docusate sodium (COLACE) 100 MG capsule Take 2 capsules (200 mg total) by mouth 2 (two) times daily.   Evolocumab (REPATHA SURECLICK) 140 MG/ML SOAJ INJECT THE CONTENTS OF 1 SYRINGE UNDER THE SKIN EVERY 14 DAYS (Patient taking differently: Inject 140 mg into the skin every 14 (fourteen) days. INJECT THE CONTENTS OF 1 SYRINGE UNDER THE SKIN EVERY 14 DAYS Next dose is due 11/06/2023.)   famotidine (PEPCID) 20 MG tablet Take 1 tablet (20 mg total) by mouth 2 (two) times daily.   hydrocortisone (ANUSOL-HC) 2.5 % rectal cream Apply rectally 2 (two) times daily.   JARDIANCE 10 MG TABS tablet TAKE 1 TABLET(10 MG) BY MOUTH DAILY BEFORE BREAKFAST (Patient taking differently: Take 10 mg by mouth daily.)   montelukast (SINGULAIR) 10 MG tablet TAKE 1 TABLET(10 MG) BY MOUTH DAILY (Patient taking differently: Take 10 mg by mouth daily.)   nitroGLYCERIN (NITROSTAT) 0.4 MG SL tablet Place 1 tablet (0.4 mg total) under the tongue every 5 (five) minutes as needed for chest pain.   pantoprazole (PROTONIX) 40 MG tablet TAKE 1  TABLET(40 MG) BY MOUTH TWICE DAILY (Patient taking differently: Take 40 mg by mouth 2 (two) times daily.)   polyethylene glycol (MIRALAX / GLYCOLAX) 17 g packet Mix 17 grams (1 packet) in 8 ounces of fluid and take by mouth daily.   sucralfate (CARAFATE) 1 g tablet TAKE 1 TABLET(1 GRAM) BY MOUTH TWICE DAILY. MAY INCREASE TO 4 TIMES DAILY FOR ANY ACUTE SYMPTOMS (Patient taking differently: Take 1 tablet by mouth at bedtime.)   tirzepatide (MOUNJARO) 2.5 MG/0.5ML Pen Inject 2.5 mg into the skin once a week.   torsemide (DEMADEX) 10 MG tablet Take 1 tablet (10 mg total) by mouth daily.   triamcinolone (NASACORT) 55 MCG/ACT AERO nasal inhaler Place 1 spray into the nose daily. Start with 1 spray each side twice a day for 3 days, then reduce to daily.   losartan (COZAAR) 25 MG tablet TAKE 1 TABLET(25 MG) BY MOUTH DAILY (Patient not taking: Reported on 11/09/2023)   Current Facility-Administered Medications on File Prior to Visit  Medication   technetium tetrofosmin (TC-MYOVIEW) injection 29.8 millicurie   Medications Discontinued During This Encounter  Medication Reason   Semaglutide,0.25 or 0.5MG /DOS, (OZEMPIC, 0.25 OR 0.5 MG/DOSE,) 2 MG/3ML SOPN  Objective   Physical Exam     11/09/2023   11:46 AM 11/09/2023   11:37 AM 11/04/2023    8:54 AM  Vitals with BMI  Height  5\' 11"    Weight  394 lbs 6 oz   BMI  55.03   Systolic 147 142   Diastolic 77 76   Pulse  70 77   Wt Readings from Last 10 Encounters:  11/09/23 (!) 394 lb 6.4 oz (178.9 kg)  11/04/23 (!) 396 lb 13.3 oz (180 kg)  11/02/23 (!) 391 lb 12.8 oz (177.7 kg)  10/08/23 (!) 384 lb (174.2 kg)  07/27/23 (!) 382 lb 14.4 oz (173.7 kg)  07/02/23 (!) 370 lb (167.8 kg)  06/17/23 (!) 377 lb 3.3 oz (171.1 kg)  05/04/23 (!) 374 lb (169.6 kg)  04/30/23 (!) 371 lb 4.8 oz (168.4 kg)  04/16/23 (!) 360 lb (163.3 kg)   Vital signs reviewed.  Nursing notes reviewed. Weight trend reviewed. Abnormalities and Problem-Specific physical exam  findings:  mild ankle edema, hemorrhoid not reexamined, marked truncal adiposity   General Appearance:  No acute distress appreciable.   Well-groomed, healthy-appearing male.  Well proportioned with no abnormal fat distribution.  Good muscle tone. Pulmonary:  Normal work of breathing at rest, no respiratory distress apparent. SpO2: 98 %  Musculoskeletal: All extremities are intact.  Neurological:  Awake, alert, oriented, and engaged.  No obvious focal neurological deficits or cognitive impairments.  Sensorium seems unclouded.   Speech is clear and coherent with logical content. Psychiatric:  Appropriate mood, pleasant and cooperative demeanor, thoughtful and engaged during the exam     Results for orders placed or performed in visit on 11/09/23  CBC with Differential/Platelet  Result Value Ref Range   WBC 4.8 4.0 - 10.5 K/uL   RBC 3.42 (L) 4.22 - 5.81 Mil/uL   Hemoglobin 11.7 (L) 13.0 - 17.0 g/dL   HCT 54.0 (L) 98.1 - 19.1 %   MCV 100.1 (H) 78.0 - 100.0 fl   MCHC 34.2 30.0 - 36.0 g/dL   RDW 47.8 29.5 - 62.1 %   Platelets 66.0 (L) 150.0 - 400.0 K/uL   Neutrophils Relative % 56.8 43.0 - 77.0 %   Lymphocytes Relative 25.4 12.0 - 46.0 %   Monocytes Relative 11.2 3.0 - 12.0 %   Eosinophils Relative 5.5 (H) 0.0 - 5.0 %   Basophils Relative 1.1 0.0 - 3.0 %   Neutro Abs 2.7 1.4 - 7.7 K/uL   Lymphs Abs 1.2 0.7 - 4.0 K/uL   Monocytes Absolute 0.5 0.1 - 1.0 K/uL   Eosinophils Absolute 0.3 0.0 - 0.7 K/uL   Basophils Absolute 0.1 0.0 - 0.1 K/uL  Comp Met (CMET)  Result Value Ref Range   Sodium 141 135 - 145 mEq/L   Potassium 3.9 3.5 - 5.1 mEq/L   Chloride 107 96 - 112 mEq/L   CO2 23 19 - 32 mEq/L   Glucose, Bld 159 (H) 70 - 99 mg/dL   BUN 66 (H) 6 - 23 mg/dL   Creatinine, Ser 3.08 (HH) 0.40 - 1.50 mg/dL   Total Bilirubin 0.7 0.2 - 1.2 mg/dL   Alkaline Phosphatase 199 (H) 39 - 117 U/L   AST 35 0 - 37 U/L   ALT 45 0 - 53 U/L   Total Protein 6.8 6.0 - 8.3 g/dL   Albumin 3.3 (L) 3.5 - 5.2  g/dL   GFR 65.78 (LL) >46.96 mL/min   Calcium 8.9 8.4 - 10.5 mg/dL  Urinalysis, Routine  w reflex microscopic  Result Value Ref Range   Color, Urine YELLOW Yellow;Lt. Yellow;Straw;Dark Yellow;Amber;Green;Red;Brown   APPearance CLEAR Clear;Turbid;Slightly Cloudy;Cloudy   Specific Gravity, Urine 1.015 1.000 - 1.030   pH 6.0 5.0 - 8.0   Total Protein, Urine 100 (A) Negative   Urine Glucose >=1000 (A) Negative   Ketones, ur NEGATIVE Negative   Bilirubin Urine NEGATIVE Negative   Hgb urine dipstick SMALL (A) Negative   Urobilinogen, UA 0.2 0.0 - 1.0   Leukocytes,Ua NEGATIVE Negative   Nitrite NEGATIVE Negative   WBC, UA 0-2/hpf 0-2/hpf   RBC / HPF 0-2/hpf 0-2/hpf   Mucus, UA Presence of (A) None   Squamous Epithelial / HPF Rare(0-4/hpf) Rare(0-4/hpf)   Amorphous Present (A) None;Present    Office Visit on 11/09/2023  Component Date Value   WBC 11/09/2023 4.8    RBC 11/09/2023 3.42 (L)    Hemoglobin 11/09/2023 11.7 (L)    HCT 11/09/2023 34.3 (L)    MCV 11/09/2023 100.1 (H)    MCHC 11/09/2023 34.2    RDW 11/09/2023 15.3    Platelets 11/09/2023 66.0 (L)    Neutrophils Relative % 11/09/2023 56.8    Lymphocytes Relative 11/09/2023 25.4    Monocytes Relative 11/09/2023 11.2    Eosinophils Relative 11/09/2023 5.5 (H)    Basophils Relative 11/09/2023 1.1    Neutro Abs 11/09/2023 2.7    Lymphs Abs 11/09/2023 1.2    Monocytes Absolute 11/09/2023 0.5    Eosinophils Absolute 11/09/2023 0.3    Basophils Absolute 11/09/2023 0.1    Sodium 11/09/2023 141    Potassium 11/09/2023 3.9    Chloride 11/09/2023 107    CO2 11/09/2023 23    Glucose, Bld 11/09/2023 159 (H)    BUN 11/09/2023 66 (H)    Creatinine, Ser 11/09/2023 5.04 (HH)    Total Bilirubin 11/09/2023 0.7    Alkaline Phosphatase 11/09/2023 199 (H)    AST 11/09/2023 35    ALT 11/09/2023 45    Total Protein 11/09/2023 6.8    Albumin 11/09/2023 3.3 (L)    GFR 11/09/2023 12.57 (LL)    Calcium 11/09/2023 8.9    Color, Urine  11/09/2023 YELLOW    APPearance 11/09/2023 CLEAR    Specific Gravity, Urine 11/09/2023 1.015    pH 11/09/2023 6.0    Total Protein, Urine 11/09/2023 100 (A)    Urine Glucose 11/09/2023 >=1000 (A)    Ketones, ur 11/09/2023 NEGATIVE    Bilirubin Urine 11/09/2023 NEGATIVE    Hgb urine dipstick 11/09/2023 SMALL (A)    Urobilinogen, UA 11/09/2023 0.2    Leukocytes,Ua 11/09/2023 NEGATIVE    Nitrite 11/09/2023 NEGATIVE    WBC, UA 11/09/2023 0-2/hpf    RBC / HPF 11/09/2023 0-2/hpf    Mucus, UA 11/09/2023 Presence of (A)    Squamous Epithelial / HPF 11/09/2023 Rare(0-4/hpf)    Amorphous 11/09/2023 Present (A)   Admission on 11/02/2023, Discharged on 11/04/2023  Component Date Value   Sodium 11/02/2023 140    Potassium 11/02/2023 3.9    Chloride 11/02/2023 106    CO2 11/02/2023 25    Glucose, Bld 11/02/2023 170 (H)    BUN 11/02/2023 70 (H)    Creatinine, Ser 11/02/2023 5.63 (H)    Calcium 11/02/2023 9.1    Total Protein 11/02/2023 7.1    Albumin 11/02/2023 2.8 (L)    AST 11/02/2023 57 (H)    ALT 11/02/2023 71 (H)    Alkaline Phosphatase 11/02/2023 188 (H)    Total Bilirubin 11/02/2023 0.7    GFR,  Estimated 11/02/2023 12 (L)    Anion gap 11/02/2023 9    WBC 11/02/2023 5.3    RBC 11/02/2023 3.49 (L)    Hemoglobin 11/02/2023 11.3 (L)    HCT 11/02/2023 35.8 (L)    MCV 11/02/2023 102.6 (H)    MCH 11/02/2023 32.4    MCHC 11/02/2023 31.6    RDW 11/02/2023 14.6    Platelets 11/02/2023 54 (L)    nRBC 11/02/2023 0.0    ABO/RH(D) 11/02/2023 A POS    Antibody Screen 11/02/2023 NEG    Sample Expiration 11/02/2023                     Value:11/05/2023,2359 Performed at [facility], [address] [place], [place] [address]    Fecal Occult Bld 11/02/2023 NEGATIVE    Troponin I (High Sensiti* 11/02/2023 3    B Natriuretic Peptide 11/02/2023 25.4    Hemoglobin 11/02/2023 11.6 (L)    HCT 11/02/2023 35.9 (L)    Glucose-Capillary 11/02/2023 123 (H)    WBC 11/03/2023 5.1    RBC  11/03/2023 3.37 (L)    Hemoglobin 11/03/2023 11.3 (L)    HCT 11/03/2023 35.3 (L)    MCV 11/03/2023 104.7 (H)    MCH 11/03/2023 33.5    MCHC 11/03/2023 32.0    RDW 11/03/2023 14.8    Platelets 11/03/2023 56 (L)    nRBC 11/03/2023 0.0    Sodium 11/03/2023 143    Potassium 11/03/2023 3.7    Chloride 11/03/2023 111    CO2 11/03/2023 23    Glucose, Bld 11/03/2023 140 (H)    BUN 11/03/2023 65 (H)    Creatinine, Ser 11/03/2023 5.44 (H)    Calcium 11/03/2023 8.7 (L)    Total Protein 11/03/2023 6.3 (L)    Albumin 11/03/2023 2.6 (L)    AST 11/03/2023 53 (H)    ALT 11/03/2023 71 (H)    Alkaline Phosphatase 11/03/2023 185 (H)    Total Bilirubin 11/03/2023 0.6    GFR, Estimated 11/03/2023 12 (L)    Anion gap 11/03/2023 9    Magnesium 11/03/2023 2.3    Phosphorus 11/03/2023 4.9 (H)    Glucose-Capillary 11/03/2023 111 (H)    Glucose-Capillary 11/03/2023 98    Color, Urine 11/03/2023 YELLOW    APPearance 11/03/2023 CLEAR    Specific Gravity, Urine 11/03/2023 1.011    pH 11/03/2023 6.0    Glucose, UA 11/03/2023 >=500 (A)    Hgb urine dipstick 11/03/2023 NEGATIVE    Bilirubin Urine 11/03/2023 NEGATIVE    Ketones, ur 11/03/2023 NEGATIVE    Protein, ur 11/03/2023 100 (A)    Nitrite 11/03/2023 NEGATIVE    Leukocytes,Ua 11/03/2023 NEGATIVE    RBC / HPF 11/03/2023 0-5    WBC, UA 11/03/2023 0-5    Bacteria, UA 11/03/2023 NONE SEEN    Squamous Epithelial / HPF 11/03/2023 0-5    Glucose-Capillary 11/03/2023 105 (H)    WBC 11/04/2023 4.9    RBC 11/04/2023 3.15 (L)    Hemoglobin 11/04/2023 10.2 (L)    HCT 11/04/2023 31.6 (L)    MCV 11/04/2023 100.3 (H)    MCH 11/04/2023 32.4    MCHC 11/04/2023 32.3    RDW 11/04/2023 14.8    Platelets 11/04/2023 54 (L)    nRBC 11/04/2023 0.0    Magnesium 11/04/2023 2.2    Sodium 11/04/2023 141    Potassium 11/04/2023 4.0    Chloride 11/04/2023 111    CO2 11/04/2023 22    Glucose, Bld 11/04/2023 132 (H)  BUN 11/04/2023 62 (H)    Creatinine, Ser  11/04/2023 5.25 (H)    Calcium 11/04/2023 8.3 (L)    Phosphorus 11/04/2023 4.8 (H)    Albumin 11/04/2023 2.5 (L)    GFR, Estimated 11/04/2023 13 (L)    Anion gap 11/04/2023 8    Glucose-Capillary 11/03/2023 167 (H)    Glucose-Capillary 11/04/2023 153 (H)   Scanned Document on 11/02/2023  Component Date Value   EGFR 10/23/2023 16.0   Abstract on 07/27/2023  Component Date Value   Hemoglobin 07/27/2023 11.6 (A)    HCT 07/27/2023 34 (A)    Neutrophils Absolute 07/27/2023 2.41    Platelets 07/27/2023 53 (A)    WBC 07/27/2023 4.3    RBC 07/27/2023 3.44 (A)    Glucose 07/27/2023 101    BUN 07/27/2023 45 (A)    CO2 07/27/2023 25 (A)    Creatinine 07/27/2023 3.2 (A)    Potassium 07/27/2023 4.4    Sodium 07/27/2023 140    Chloride 07/27/2023 109 (A)    Calcium 07/27/2023 9.2    Albumin 07/27/2023 3.7    Alkaline Phosphatase 07/27/2023 192 (A)    ALT 07/27/2023 50 (A)    AST 07/27/2023 48 (A)    Bilirubin, Total 07/27/2023 1.4   Appointment on 07/27/2023  Component Date Value   Kappa free light chain 07/27/2023 626.3 (H)    Lambda free light chains 07/27/2023 25.3    Kappa, lambda light chai* 07/27/2023 24.75 (H)    IgG (Immunoglobin G), Se* 07/27/2023 673    IgA 07/27/2023 1,160 (H)    IgM (Immunoglobulin M), * 07/27/2023 90    Total Protein ELP 07/27/2023 6.0    Albumin SerPl Elph-Mcnc 07/27/2023 3.0    Alpha 1 07/27/2023 0.2    Alpha2 Glob SerPl Elph-M* 07/27/2023 0.6    B-Globulin SerPl Elph-Mc* 07/27/2023 1.7 (H)    Gamma Glob SerPl Elph-Mc* 07/27/2023 0.5    M Protein SerPl Elph-Mcnc 07/27/2023 0.8 (H)    Globulin, Total 07/27/2023 3.0    Albumin/Glob SerPl 07/27/2023 1.1    IFE 1 07/27/2023 Comment (A)    Please Note 07/27/2023 Comment    Total Protein ELP 07/27/2023 6.4    Albumin ELP 07/27/2023 3.2    Alpha-1-Globulin 07/27/2023 0.3    Alpha-2-Globulin 07/27/2023 0.6    Beta Globulin 07/27/2023 1.7 (H)    Gamma Globulin 07/27/2023 0.7    M-Spike, % 07/27/2023  1.0 (H)    SPE Interp. 07/27/2023 Comment    Comment 07/27/2023 Comment    Globulin, Total 07/27/2023 3.2    A/G Ratio 07/27/2023 1.0   Office Visit on 07/02/2023  Component Date Value   VITD 07/02/2023 19.91 (L)    Free T4 07/02/2023 0.80    TSH 07/02/2023 10.55 (H)   Hospital Outpatient Visit on 06/26/2023  Component Date Value   WBC 06/26/2023 5.3    RBC 06/26/2023 3.34 (L)    Hemoglobin 06/26/2023 10.9 (L)    HCT 06/26/2023 33.3 (L)    MCV 06/26/2023 99.7    MCH 06/26/2023 32.6    MCHC 06/26/2023 32.7    RDW 06/26/2023 14.1    Platelets 06/26/2023 63 (L)    nRBC 06/26/2023 0.0    Prothrombin Time 06/26/2023 14.5    INR 06/26/2023 1.1    Sodium 06/26/2023 139    Potassium 06/26/2023 3.8    Chloride 06/26/2023 109    CO2 06/26/2023 20 (L)    Glucose, Bld 06/26/2023 114 (H)    BUN 06/26/2023 44 (H)  Creatinine, Ser 06/26/2023 3.81 (H)    Calcium 06/26/2023 8.5 (L)    GFR, Estimated 06/26/2023 18 (L)    Anion gap 06/26/2023 10    SURGICAL PATHOLOGY 06/26/2023                     Value:SURGICAL PATHOLOGY CASE: [id number] PATIENT: Julio Sicks Surgical Pathology Report     Clinical History: Medical liver (nt)     FINAL MICROSCOPIC DIAGNOSIS:  A. LIVER, BIOPSY: -  Heavily fragmented core of liver with essentially unremarkable hepatic parenchyma and portal tracts with mild portal fibrosis, mild chronic inflammation and questionable bile ductular proliferation. Note: It is noted that the patient has a complex medical history including acute on chronic kidney injury, chronic thrombocytopenia, a plasma cell neoplasm, diabetes, coronary artery disease with heart failure and reduced ejection fraction as well as morbid obesity.  The hepatic parenchyma itself is overall unremarkable without the presence of steatosis, ballooning or apoptotic bodies.  It should be noted there is quite a bit of fragmentation which can be a sign of a greater degree of  fibrosis/cirrhosis then can be identified on the needle core biopsy given a diminished number of p                         ortal triads.  The portal triads available for evaluation do show a mild degree of portal fibrosis but without definitive evidence of periportal or bridging type fibrosis.  There is the presence of questionable bile ductular reaction which again raises the possibility of fibrosis that is not as readily identifiable on the HE/trichrome stains.  There is no central vein or perisinusoidal/pericellular fibrosis.  An iron stain shows no stainable iron a PAS and reticulin stain are also reviewed in the workup of the case. In summary, there is no ongoing hepatocellular damage identified including the presence of steatosis.  There are indications of an increased level of fibrosis given the fragmentation and bile ductular proliferation; however, the overall stage is difficult to evaluate on the current biopsy but is suggested to be likely stage III of 4.  If clinically indicated a repeat biopsy could be considered.    GROSS DESCRIPTION:  A. Received in formalin labeled with                          the patients name and "Liver bx" is a 1.1 x 0.8 x 0.1 cm aggregate of tan soft tissue fragments, submitted in toto in a single cassette.  (LEF 06/26/2023)   Final Diagnosis performed by Orene Desanctis DO.   Electronically signed 06/30/2023 Technical component performed at [employer]. [facility], [address], [place], [place] [address].  Professional component performed at [facility], [address], [place], [place] [address].  Immunohistochemistry Technical component (if applicable) was performed at [facility]. [address], [address], [place], [place] [address].   IMMUNOHISTOCHEMISTRY DISCLAIMER (if applicable): Some of these immunohistochemical stains may have been developed and the performance characteristics  determine by [facility]. Some may not have been cleared or approved by the U.S. Food and Drug Administration. The FDA has determined that such clearance or approval is not necessary. This test                          is used for clinical purposes. It should not be  regarded as investigational or for research. This laboratory is certified under the Clinical Laboratory Improvement Amendments of 1988 (CLIA-88) as qualified to perform high complexity clinical laboratory testing.  The controls stained appropriately.   IHC stains are performed on formalin fixed, paraffin embedded tissue using a 3,3"diaminobenzidine (DAB) chromogen and Leica Bond Autostainer System. The staining intensity of the nucleus is score manually and is reported as the percentage of tumor cell nuclei demonstrating specific nuclear staining. The specimens are fixed in 10% Neutral Formalin for at least 6 hours and up to 72hrs. These tests are validated on decalcified tissue. Results should be interpreted with caution given the possibility of false negative results on decalcified specimens. Antibody Clones are as follows ER-clone 32F, PR-clone 16, Ki67- clone MM1. Some of these immunohistochemical stains may have been dev                         eloped and the performance characteristics determined by [facility].   Admission on 06/17/2023, Discharged on 06/19/2023  Component Date Value   WBC 06/17/2023 5.0    RBC 06/17/2023 3.56 (L)    Hemoglobin 06/17/2023 12.0 (L)    HCT 06/17/2023 36.3 (L)    MCV 06/17/2023 102.0 (H)    MCH 06/17/2023 33.7    MCHC 06/17/2023 33.1    RDW 06/17/2023 14.2    Platelets 06/17/2023 63 (L)    nRBC 06/17/2023 0.0    ABO/RH(D) 06/17/2023 A POS    Antibody Screen 06/17/2023 NEG    Sample Expiration 06/17/2023                     Value:06/20/2023,2359 Performed at [facility], [address] [place], [place] [address]    Sodium 06/17/2023 142    Potassium  06/17/2023 4.1    Chloride 06/17/2023 110    CO2 06/17/2023 24    Glucose, Bld 06/17/2023 120 (H)    BUN 06/17/2023 36 (H)    Creatinine, Ser 06/17/2023 3.35 (H)    Calcium 06/17/2023 9.0    GFR, Estimated 06/17/2023 21 (L)    Anion gap 06/17/2023 8    HIV Screen 4th Generatio* 06/17/2023 Non Reactive    Hgb A1c MFr Bld 06/17/2023 5.6    Mean Plasma Glucose 06/17/2023 114    Glucose-Capillary 06/17/2023 129 (H)    WBC 06/17/2023 5.3    RBC 06/17/2023 3.43 (L)    Hemoglobin 06/17/2023 11.6 (L)    HCT 06/17/2023 34.8 (L)    MCV 06/17/2023 101.5 (H)    MCH 06/17/2023 33.8    MCHC 06/17/2023 33.3    RDW 06/17/2023 14.3    Platelets 06/17/2023 53 (L)    nRBC 06/17/2023 0.0    Prothrombin Time 06/17/2023 14.5    INR 06/17/2023 1.1    Glucose-Capillary 06/17/2023 106 (H)    WBC 06/18/2023 4.7    RBC 06/18/2023 3.53 (L)    Hemoglobin 06/18/2023 11.5 (L)    HCT 06/18/2023 35.7 (L)    MCV 06/18/2023 101.1 (H)    MCH 06/18/2023 32.6    MCHC 06/18/2023 32.2    RDW 06/18/2023 14.3    Platelets 06/18/2023 55 (L)    nRBC 06/18/2023 0.0    Neutrophils Relative % 06/18/2023 53    Neutro Abs 06/18/2023 2.5    Lymphocytes Relative 06/18/2023 25    Lymphs Abs 06/18/2023 1.2    Monocytes Relative 06/18/2023 11    Monocytes Absolute 06/18/2023 0.5  Eosinophils Relative 06/18/2023 10    Eosinophils Absolute 06/18/2023 0.4    Basophils Relative 06/18/2023 1    Basophils Absolute 06/18/2023 0.0    Immature Granulocytes 06/18/2023 0    Abs Immature Granulocytes 06/18/2023 0.02    Glucose-Capillary 06/18/2023 85    Glucose-Capillary 06/18/2023 133 (H)    Sodium 06/19/2023 139    Potassium 06/19/2023 4.2    Chloride 06/19/2023 110    CO2 06/19/2023 23    Glucose, Bld 06/19/2023 131 (H)    BUN 06/19/2023 42 (H)    Creatinine, Ser 06/19/2023 3.76 (H)    Calcium 06/19/2023 8.6 (L)    GFR, Estimated 06/19/2023 19 (L)    Anion gap 06/19/2023 6    WBC 06/19/2023 5.0    RBC 06/19/2023  3.28 (L)    Hemoglobin 06/19/2023 10.9 (L)    HCT 06/19/2023 34.1 (L)    MCV 06/19/2023 104.0 (H)    MCH 06/19/2023 33.2    MCHC 06/19/2023 32.0    RDW 06/19/2023 14.4    Platelets 06/19/2023 54 (L)    nRBC 06/19/2023 0.0    Magnesium 06/19/2023 2.1    Glucose-Capillary 06/18/2023 143 (H)    Glucose-Capillary 06/19/2023 119 (H)   Hospital Outpatient Visit on 06/18/2023  Component Date Value   SURGICAL PATHOLOGY 06/18/2023                     Value:SURGICAL PATHOLOGY CASE: [id number] PATIENT: Daryel Garman Surgical Pathology Report     Clinical History: CKD stage 4, proteinuria (cm)     FINAL MICROSCOPIC DIAGNOSIS:  A. KIDNEY, RIGHT LOWER POLE, NEEDLE CORE BIOPSY: - See OUTSIDE PATHOLOGY REPORT under RESULTS REVIEW or the LABS TAB under "SURGICAL PATHOLOGY" in [facility].   GROSS DESCRIPTION:  Received fresh are 2 cores of pink-tan soft tissue, each measuring 1.4 cm in length by 0.1 cm in diameter.  The specimen is entirely sent out for [employer] processing. (KW, 06/18/2023)   Final Diagnosis performed by Jimmy Picket, MD.   Electronically signed 06/24/2023 Technical and / or Professional components performed at [facility]. [facility], [address] [place], [place] [address].  Immunohistochemistry Technical component (if applicable) was performed at [facility]. [address], [place], [place] [address].   IMMUNOHISTOCHEMISTRY DISCLAIMER (if applicable): Some of th                         ese immunohistochemical stains may have been developed and the performance characteristics determine by [facility]. Some may not have been cleared or approved by the U.S. Food and Drug Administration. The FDA has determined that such clearance or approval is not necessary. This test is used for clinical purposes. It should not be regarded as investigational or for research. This laboratory is certified under the Clinical Laboratory  Improvement Amendments of 1988 (CLIA-88) as qualified to perform high complexity clinical laboratory testing.  The controls stained appropriately.   IHC stains are performed on formalin fixed, paraffin embedded tissue using a 3,3"diaminobenzidine (DAB) chromogen and Leica Bond Autostainer System. The staining intensity of the nucleus is score manually and is reported as the percentage of tumor cell nuclei demonstrating specific nuclear staining. The specimens are fixed in 10% Neutral Formalin for at least 6 hours and up to 72hrs.                          These tests  are validated on decalcified tissue. Results should be interpreted with caution given the possibility of false negative results on decalcified specimens. Antibody Clones are as follows ER-clone 87F, PR-clone 16, Ki67- clone MM1. Some of these immunohistochemical stains may have been developed and the performance characteristics determined by [facility].   Office Visit on 05/04/2023  Component Date Value   Cholesterol 05/04/2023 173    Triglycerides 05/04/2023 132.0    HDL 05/04/2023 53.70    VLDL 05/04/2023 26.4    LDL Cholesterol 05/04/2023 93    Total CHOL/HDL Ratio 05/04/2023 3    NonHDL 05/04/2023 118.98    TSH 05/04/2023 5.660 (H)    GGT 05/04/2023 299 (H)    AST 05/04/2023 36    ALT 05/04/2023 39    Platelets 05/04/2023 55 (LL)    Hematology Comments: 05/04/2023 Note:    FIB-4 Index 05/04/2023 5.24 (H)    T4,Free (Direct) 05/04/2023 0.93    Fibrosis Score 05/04/2023 0.24 (H)    Fibrosis Stage 05/04/2023 F0-F1    Steatosis Score 05/04/2023 0.76 (H)    Steatosis Grade 05/04/2023 Comment    NASH Score 05/04/2023 0.55 (H)    NASH Grade 05/04/2023 Comment    Methodology: 05/04/2023 Comment    ALPHA 2-MACROGLOBULINS, * 05/04/2023 132    Haptoglobin 05/04/2023 96    Apolipoprotein A-1 05/04/2023 144    Bilirubin, Total 05/04/2023 0.3    GGT 05/04/2023 334 (H)    ALT (SGPT) P5P 05/04/2023 45    AST  (SGOT) P5P 05/04/2023 34    Cholesterol, Total 05/04/2023 187    Glucose 05/04/2023 93    Triglycerides 05/04/2023 136    Interpretations: 05/04/2023 Comment    Fibrosis Scoring: 05/04/2023 Comment    Steatosis Scoring 05/04/2023 Comment    NASH Scoring 05/04/2023 Comment    Limitations: 05/04/2023 Comment    Comment: 05/04/2023 Comment   There may be more visits with results that are not included.   No image results found.   CT ABDOMEN PELVIS WO CONTRAST  Result Date: 11/02/2023 CLINICAL DATA:  Acute nonlocalized abdominal pain. Right red blood in stool this morning. EXAM: CT ABDOMEN AND PELVIS WITHOUT CONTRAST TECHNIQUE: Multidetector CT imaging of the abdomen and pelvis was performed following the standard protocol without IV contrast. RADIATION DOSE REDUCTION: This exam was performed according to the departmental dose-optimization program which includes automated exposure control, adjustment of the mA and/or kV according to patient size and/or use of iterative reconstruction technique. COMPARISON:  01/26/2023 FINDINGS: Lower chest: Lung bases are clear. Postoperative changes in the mediastinum. Elevation of the left hemidiaphragm is unchanged. Hepatobiliary: No focal liver abnormality is seen. Status post cholecystectomy. No biliary dilatation. Pancreas: Unremarkable. No pancreatic ductal dilatation or surrounding inflammatory changes. Spleen: Normal in size without focal abnormality. Adrenals/Urinary Tract: Adrenal glands are unremarkable. Kidneys are normal, without renal calculi, focal lesion, or hydronephrosis. Bladder is unremarkable. Stomach/Bowel: Stomach, small bowel, and colon are not abnormally distended. Scattered stool in the colon. No wall thickening or inflammatory changes. Appendix is not identified. Vascular/Lymphatic: Aortic atherosclerosis. No enlarged abdominal or pelvic lymph nodes. Reproductive: Prostate gland is atrophic or surgically absent. No pelvic mass or  lymphadenopathy. Other: No free air or free fluid in the abdomen. Small periumbilical hernia containing fat. Cystic structure in the left periumbilical space measuring 4 cm diameter. This is likely a sebaceous cyst. No change since previous study. Musculoskeletal: Degenerative changes in the spine. 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 IMPRESSION: 1. No acute process demonstrated in the  abdomen or pelvis. No significant change since previous study. 2. Chronic elevation of the left hemidiaphragm. 3. Mild aortic atherosclerosis. 4. Cystic structure adjacent to the umbilicus likely representing a sebaceous cyst. No change. Electronically Signed   By: Burman Nieves M.D.   On: 11/02/2023 20:15   DG Chest Portable 1 View  Result Date: 11/02/2023 CLINICAL DATA:  Shortness of breath. Bright red blood in stool this morning. Midsternal chest pain. Fatigue. EXAM: PORTABLE CHEST 1 VIEW COMPARISON:  01/26/2023 FINDINGS: Postoperative changes in the mediastinum with sternotomy wires present. Elevation of the left hemidiaphragm is unchanged since prior study. Lungs are clear. No pleural effusions. No pneumothorax. Mediastinal contours appear intact. IMPRESSION: Elevation of left hemidiaphragm is unchanged.  Lungs are clear. Electronically Signed   By: Burman Nieves M.D.   On: 11/02/2023 20:10    CT ABDOMEN PELVIS WO CONTRAST  Result Date: 11/02/2023 CLINICAL DATA:  Acute nonlocalized abdominal pain. Right red blood in stool this morning. EXAM: CT ABDOMEN AND PELVIS WITHOUT CONTRAST TECHNIQUE: Multidetector CT imaging of the abdomen and pelvis was performed following the standard protocol without IV contrast. RADIATION DOSE REDUCTION: This exam was performed according to the departmental dose-optimization program which includes automated exposure control, adjustment of the mA and/or kV according to patient size and/or use of iterative reconstruction technique. COMPARISON:  01/26/2023 FINDINGS: Lower chest:  Lung bases are clear. Postoperative changes in the mediastinum. Elevation of the left hemidiaphragm is unchanged. Hepatobiliary: No focal liver abnormality is seen. Status post cholecystectomy. No biliary dilatation. Pancreas: Unremarkable. No pancreatic ductal dilatation or surrounding inflammatory changes. Spleen: Normal in size without focal abnormality. Adrenals/Urinary Tract: Adrenal glands are unremarkable. Kidneys are normal, without renal calculi, focal lesion, or hydronephrosis. Bladder is unremarkable. Stomach/Bowel: Stomach, small bowel, and colon are not abnormally distended. Scattered stool in the colon. No wall thickening or inflammatory changes. Appendix is not identified. Vascular/Lymphatic: Aortic atherosclerosis. No enlarged abdominal or pelvic lymph nodes. Reproductive: Prostate gland is atrophic or surgically absent. No pelvic mass or lymphadenopathy. Other: No free air or free fluid in the abdomen. Small periumbilical hernia containing fat. Cystic structure in the left periumbilical space measuring 4 cm diameter. This is likely a sebaceous cyst. No change since previous study. Musculoskeletal: Degenerative changes in the spine. 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 IMPRESSION: 1. No acute process demonstrated in the abdomen or pelvis. No significant change since previous study. 2. Chronic elevation of the left hemidiaphragm. 3. Mild aortic atherosclerosis. 4. Cystic structure adjacent to the umbilicus likely representing a sebaceous cyst. No change. Electronically Signed   By: Burman Nieves M.D.   On: 11/02/2023 20:15   DG Chest Portable 1 View  Result Date: 11/02/2023 CLINICAL DATA:  Shortness of breath. Bright red blood in stool this morning. Midsternal chest pain. Fatigue. EXAM: PORTABLE CHEST 1 VIEW COMPARISON:  01/26/2023 FINDINGS: Postoperative changes in the mediastinum with sternotomy wires present. Elevation of the left hemidiaphragm is unchanged since prior study.  Lungs are clear. No pleural effusions. No pneumothorax. Mediastinal contours appear intact. IMPRESSION: Elevation of left hemidiaphragm is unchanged.  Lungs are clear. Electronically Signed   By: Burman Nieves M.D.   On: 11/02/2023 20:10         Additional Info: This encounter employed real-time, collaborative documentation. The patient actively reviewed and updated their medical record on a shared screen, ensuring transparency and facilitating joint problem-solving for the problem list, overview, and plan. This approach promotes accurate, informed care. The treatment plan was discussed and reviewed  in detail, including medication safety, potential side effects, and all patient questions. We confirmed understanding and comfort with the plan. Follow-up instructions were established, including contacting the office for any concerns, returning if symptoms worsen, persist, or new symptoms develop, and precautions for potential emergency department visits.

## 2023-11-09 NOTE — Telephone Encounter (Signed)
Informed patient of critical labs and advised him to go to the hospital per Dr. Jon Billings. Patient stated that he wants Dr. Jon Billings to talk with Dr. Zetta Bills and then for the both of them to call him concerning this. Also, that his creatinine was about the same when he was recently in the hospital and they allowed him to leave.

## 2023-11-09 NOTE — Assessment & Plan Note (Signed)
Acute Kidney Injury   He was recently hospitalized for acute kidney injury, likely due to increased diuretic use and dehydration, but has since recovered. He is currently on torsemide for fluid management. We discussed the importance of monitoring fluid status and the potential need to adjust diuretic use. We explained that dehydration from diuretics could lead to toxin buildup, causing nausea and vomiting, which can be mistaken for GI bleeding. We will order kidney function tests, monitor fluid status and adjust the diuretic as needed, and share lab results with his nephrologist. He will follow up with his nephrologist, Dr. Zetta Bills, and consider restarting losartan based on the nephrologist's advice.

## 2023-11-09 NOTE — Telephone Encounter (Signed)
Hope from [facility]  Critical:  Creatine: 5.04   GFR: 12.57  Christy Gentles

## 2023-11-09 NOTE — Telephone Encounter (Signed)
Pharmacy Patient Advocate Encounter   Received notification from Pt Calls Messages that prior authorization for Ozempic 2mg /13ml is required/requested.   Insurance verification completed.   The patient is insured through [facility] .   Per test claim: The current 28 day co-pay is, $0.  No PA needed at this time. This test claim was processed through [facility]- copay amounts may vary at other pharmacies due to pharmacy/plan contracts, or as the patient moves through the different stages of their insurance plan.

## 2023-11-09 NOTE — Assessment & Plan Note (Signed)
Obesity   He is on weight loss medication, likely semaglutide or tirzepatide, currently at 0.25 mg weekly, which will increase to 0.5 mg weekly after four weeks. This medication aims to reduce appetite and aid in weight loss, potentially preventing another myocardial infarction by reducing weight. We discussed the risks of nausea, vomiting, dehydration, and kidney issues, emphasizing the importance of having nausea medication on hand and monitoring for side effects. We will send a prescription for a higher dose of the current weight loss medication. He must call the office with the exact name of the medication for the higher dose prescription. We will monitor for side effects and adjust the dose as needed, ensuring he has nausea medication on hand.

## 2023-11-09 NOTE — Telephone Encounter (Signed)
Per test claim: The current 28 day co-pay for Ozempic is, $0.  No PA needed at this time. This test claim was processed through [facility]- copay amounts may vary at other pharmacies due to pharmacy/plan contracts, or as the patient moves through the different stages of their insurance plan.

## 2023-11-10 LAB — PROTEIN / CREATININE RATIO, URINE
Creatinine, Urine: 42 mg/dL (ref 20–320)
Protein/Creat Ratio: 2762 mg/g{creat} — ABNORMAL HIGH (ref 25–148)
Protein/Creatinine Ratio: 2.762 mg/mg{creat} — ABNORMAL HIGH (ref 0.025–0.148)
Total Protein, Urine: 116 mg/dL — ABNORMAL HIGH (ref 5–25)

## 2023-11-10 NOTE — Telephone Encounter (Signed)
Patient informed me of this.

## 2023-11-10 NOTE — Telephone Encounter (Signed)
Noted  

## 2023-11-12 ENCOUNTER — Ambulatory Visit
Admission: RE | Admit: 2023-11-12 | Discharge: 2023-11-12 | Disposition: A | Discharge status: Home / Self Care | Payer: BC Managed Care – PPO | Source: Ambulatory Visit | Attending: Internal Medicine | Admitting: Internal Medicine

## 2023-11-12 DIAGNOSIS — N184 Chronic kidney disease, stage 4 (severe): Secondary | ICD-10-CM | POA: Diagnosis not present

## 2023-11-12 DIAGNOSIS — R7303 Prediabetes: Secondary | ICD-10-CM | POA: Diagnosis not present

## 2023-11-12 DIAGNOSIS — J329 Chronic sinusitis, unspecified: Secondary | ICD-10-CM

## 2023-11-12 DIAGNOSIS — E8809 Other disorders of plasma-protein metabolism, not elsewhere classified: Secondary | ICD-10-CM | POA: Diagnosis not present

## 2023-11-12 DIAGNOSIS — I129 Hypertensive chronic kidney disease with stage 1 through stage 4 chronic kidney disease, or unspecified chronic kidney disease: Secondary | ICD-10-CM | POA: Diagnosis not present

## 2023-11-19 ENCOUNTER — Other Ambulatory Visit (HOSPITAL_COMMUNITY): Payer: Self-pay

## 2023-11-27 ENCOUNTER — Encounter: Payer: Self-pay | Admitting: Internal Medicine

## 2023-11-27 DIAGNOSIS — J329 Chronic sinusitis, unspecified: Secondary | ICD-10-CM | POA: Insufficient documentation

## 2023-11-27 NOTE — Telephone Encounter (Signed)
Called pt to f/u leg swelling.  Reports Kidney doctor changed furosemide to Torsemide 10 mg.  Reports swelling is better.  No further concerns at this time.

## 2023-11-27 NOTE — Progress Notes (Signed)
Reviewed CT sinus from 11/24/2023. Shows normal sinus anatomy and aeration without evidence of infection or inflammation. No structural cause identified for symptoms. Will proceed with ENT evaluation for persistent symptoms. Detailed explanation in Patient Message.

## 2023-11-30 ENCOUNTER — Other Ambulatory Visit: Payer: Self-pay | Admitting: Internal Medicine

## 2023-11-30 ENCOUNTER — Other Ambulatory Visit: Payer: Self-pay | Admitting: Pharmacist

## 2023-11-30 ENCOUNTER — Ambulatory Visit: Payer: Self-pay | Admitting: Internal Medicine

## 2023-11-30 DIAGNOSIS — K7210 Chronic hepatic failure without coma: Secondary | ICD-10-CM

## 2023-11-30 DIAGNOSIS — E782 Mixed hyperlipidemia: Secondary | ICD-10-CM

## 2023-11-30 DIAGNOSIS — K219 Gastro-esophageal reflux disease without esophagitis: Secondary | ICD-10-CM

## 2023-11-30 DIAGNOSIS — R001 Bradycardia, unspecified: Secondary | ICD-10-CM

## 2023-11-30 DIAGNOSIS — D696 Thrombocytopenia, unspecified: Secondary | ICD-10-CM

## 2023-11-30 DIAGNOSIS — I2510 Atherosclerotic heart disease of native coronary artery without angina pectoris: Secondary | ICD-10-CM

## 2023-11-30 DIAGNOSIS — Z5309 Procedure and treatment not carried out because of other contraindication: Secondary | ICD-10-CM

## 2023-11-30 DIAGNOSIS — Z951 Presence of aortocoronary bypass graft: Secondary | ICD-10-CM

## 2023-11-30 DIAGNOSIS — N184 Chronic kidney disease, stage 4 (severe): Secondary | ICD-10-CM

## 2023-11-30 DIAGNOSIS — K7581 Nonalcoholic steatohepatitis (NASH): Secondary | ICD-10-CM

## 2023-11-30 DIAGNOSIS — M1A09X0 Idiopathic chronic gout, multiple sites, without tophus (tophi): Secondary | ICD-10-CM

## 2023-11-30 DIAGNOSIS — E039 Hypothyroidism, unspecified: Secondary | ICD-10-CM

## 2023-11-30 DIAGNOSIS — M4316 Spondylolisthesis, lumbar region: Secondary | ICD-10-CM

## 2023-11-30 DIAGNOSIS — I872 Venous insufficiency (chronic) (peripheral): Secondary | ICD-10-CM

## 2023-11-30 DIAGNOSIS — E7849 Other hyperlipidemia: Secondary | ICD-10-CM

## 2023-11-30 DIAGNOSIS — I4892 Unspecified atrial flutter: Secondary | ICD-10-CM

## 2023-11-30 DIAGNOSIS — G4733 Obstructive sleep apnea (adult) (pediatric): Secondary | ICD-10-CM

## 2023-11-30 DIAGNOSIS — D699 Hemorrhagic condition, unspecified: Secondary | ICD-10-CM

## 2023-11-30 DIAGNOSIS — R5381 Other malaise: Secondary | ICD-10-CM

## 2023-11-30 DIAGNOSIS — D472 Monoclonal gammopathy: Secondary | ICD-10-CM

## 2023-11-30 DIAGNOSIS — I1A0 Resistant hypertension: Secondary | ICD-10-CM

## 2023-11-30 DIAGNOSIS — Z8719 Personal history of other diseases of the digestive system: Secondary | ICD-10-CM

## 2023-11-30 DIAGNOSIS — R7303 Prediabetes: Secondary | ICD-10-CM

## 2023-11-30 DIAGNOSIS — R161 Splenomegaly, not elsewhere classified: Secondary | ICD-10-CM

## 2023-11-30 DIAGNOSIS — Z79899 Other long term (current) drug therapy: Secondary | ICD-10-CM

## 2023-11-30 DIAGNOSIS — R112 Nausea with vomiting, unspecified: Secondary | ICD-10-CM

## 2023-11-30 DIAGNOSIS — I77819 Aortic ectasia, unspecified site: Secondary | ICD-10-CM

## 2023-11-30 MED ORDER — REPATHA SURECLICK 140 MG/ML ~~LOC~~ SOAJ: SUBCUTANEOUS | 0 refills | Status: DC

## 2023-11-30 NOTE — Telephone Encounter (Signed)
Going to call the pharmacy back at 2pm today concerning this.

## 2023-11-30 NOTE — Telephone Encounter (Signed)
Copied from CRM [id number]. Topic: Clinical - Red Word Triage >> Nov 30, 2023  8:36 AM Sean [name] wrote: PT states his blood pressure was 180/120 and is very dizzy. PT states he is also very shaky and cold  then hot.  Chief Complaint:  Blood  Pressure 180/70 last night   feel like staggering . Taking  weight loss shot and Jardiance.  Patient believe it is his [place]ju[place]  Symptoms: Dizziness, shakes when he stands up. Vomiting  in the morning last vomit on yesrday. Blood Sugar   Saturday 160-180  today 163/108 Hr 70 Frequency:    approximately 10 days ago. Pertinent Negatives: Patient denies  Chest Pain . Disposition: [] ED /[] Urgent Care (no appt availability in office) / [x] Appointment(In office/virtual)/ []  [facility]/ [] Home Care/ [] Refused Recommended Disposition /[] [facility] [place]/ []  Follow-up with PCP Additional Notes:  Blood Pressure on the  top  has always been this high.   Unsteady Gait is new. Reason for Disposition  [1] Systolic BP  >= 160 OR Diastolic >= 100 AND [2] cardiac (e.g., breathing difficulty, chest pain) or neurologic symptoms (e.g., new-onset blurred or double vision, unsteady gait)  Answer Assessment - Initial Assessment Questions 1. BLOOD PRESSURE: "What is the blood pressure?" "Did you take at least two measurements 5 minutes apart?"     180/70 2. ONSeT: "When did you take your blood pressure?"     yesterday 3. HOW: "How did you take your blood pressure?" (e.g., automatic home BP monitor, visiting nurse)     Automatic monitor 4. HISTORY: "Do you have a history of high blood pressure?"      Always 5. MEDICINES: "Are you taking any medicines for blood pressure?" "Have you missed any doses recently?"      Yes  takes them all. 6. OTHER SYMPTOMS: "Do you have any symptoms?" (e.g., blurred vision, chest pain, difficulty breathing, headache, weakness)      Weakness  Protocols used: Blood Pressure - High-A-AH

## 2023-11-30 NOTE — Telephone Encounter (Signed)
See Triage note 

## 2023-12-01 ENCOUNTER — Encounter (INDEPENDENT_AMBULATORY_CARE_PROVIDER_SITE_OTHER): Payer: Self-pay

## 2023-12-01 ENCOUNTER — Encounter: Payer: Self-pay | Admitting: Internal Medicine

## 2023-12-01 ENCOUNTER — Other Ambulatory Visit (HOSPITAL_BASED_OUTPATIENT_CLINIC_OR_DEPARTMENT_OTHER): Payer: Self-pay

## 2023-12-01 ENCOUNTER — Ambulatory Visit (INDEPENDENT_AMBULATORY_CARE_PROVIDER_SITE_OTHER): Payer: BC Managed Care – PPO | Admitting: Internal Medicine

## 2023-12-01 ENCOUNTER — Ambulatory Visit: Payer: Self-pay | Admitting: Internal Medicine

## 2023-12-01 ENCOUNTER — Ambulatory Visit (INDEPENDENT_AMBULATORY_CARE_PROVIDER_SITE_OTHER): Payer: BC Managed Care – PPO | Admitting: Otolaryngology

## 2023-12-01 ENCOUNTER — Other Ambulatory Visit: Payer: Self-pay

## 2023-12-01 VITALS — BP 156/76 | HR 68 | Resp 19 | Ht 71.0 in | Wt 391.0 lb

## 2023-12-01 VITALS — BP 154/72 | HR 61 | Temp 98.8°F | Ht 71.0 in | Wt 391.4 lb

## 2023-12-01 DIAGNOSIS — H5500 Unspecified nystagmus: Secondary | ICD-10-CM | POA: Diagnosis not present

## 2023-12-01 DIAGNOSIS — M4316 Spondylolisthesis, lumbar region: Secondary | ICD-10-CM

## 2023-12-01 DIAGNOSIS — R112 Nausea with vomiting, unspecified: Secondary | ICD-10-CM

## 2023-12-01 DIAGNOSIS — N184 Chronic kidney disease, stage 4 (severe): Secondary | ICD-10-CM

## 2023-12-01 DIAGNOSIS — M1A09X0 Idiopathic chronic gout, multiple sites, without tophus (tophi): Secondary | ICD-10-CM

## 2023-12-01 DIAGNOSIS — N179 Acute kidney failure, unspecified: Secondary | ICD-10-CM

## 2023-12-01 DIAGNOSIS — K7581 Nonalcoholic steatohepatitis (NASH): Secondary | ICD-10-CM

## 2023-12-01 DIAGNOSIS — K625 Hemorrhage of anus and rectum: Secondary | ICD-10-CM

## 2023-12-01 DIAGNOSIS — J343 Hypertrophy of nasal turbinates: Secondary | ICD-10-CM

## 2023-12-01 DIAGNOSIS — J0181 Other acute recurrent sinusitis: Secondary | ICD-10-CM

## 2023-12-01 DIAGNOSIS — R7309 Other abnormal glucose: Secondary | ICD-10-CM

## 2023-12-01 DIAGNOSIS — Z79899 Other long term (current) drug therapy: Secondary | ICD-10-CM

## 2023-12-01 DIAGNOSIS — R7303 Prediabetes: Secondary | ICD-10-CM

## 2023-12-01 DIAGNOSIS — I872 Venous insufficiency (chronic) (peripheral): Secondary | ICD-10-CM

## 2023-12-01 DIAGNOSIS — T43015S Adverse effect of tricyclic antidepressants, sequela: Secondary | ICD-10-CM

## 2023-12-01 DIAGNOSIS — E782 Mixed hyperlipidemia: Secondary | ICD-10-CM

## 2023-12-01 DIAGNOSIS — K7210 Chronic hepatic failure without coma: Secondary | ICD-10-CM

## 2023-12-01 DIAGNOSIS — I4892 Unspecified atrial flutter: Secondary | ICD-10-CM

## 2023-12-01 DIAGNOSIS — B009 Herpesviral infection, unspecified: Secondary | ICD-10-CM

## 2023-12-01 DIAGNOSIS — R260 Ataxic gait: Secondary | ICD-10-CM

## 2023-12-01 DIAGNOSIS — I10 Essential (primary) hypertension: Secondary | ICD-10-CM

## 2023-12-01 DIAGNOSIS — I2510 Atherosclerotic heart disease of native coronary artery without angina pectoris: Secondary | ICD-10-CM

## 2023-12-01 DIAGNOSIS — Z951 Presence of aortocoronary bypass graft: Secondary | ICD-10-CM

## 2023-12-01 DIAGNOSIS — R251 Tremor, unspecified: Secondary | ICD-10-CM | POA: Diagnosis not present

## 2023-12-01 DIAGNOSIS — R748 Abnormal levels of other serum enzymes: Secondary | ICD-10-CM

## 2023-12-01 DIAGNOSIS — I517 Cardiomegaly: Secondary | ICD-10-CM

## 2023-12-01 DIAGNOSIS — L219 Seborrheic dermatitis, unspecified: Secondary | ICD-10-CM

## 2023-12-01 DIAGNOSIS — E039 Hypothyroidism, unspecified: Secondary | ICD-10-CM

## 2023-12-01 DIAGNOSIS — R5381 Other malaise: Secondary | ICD-10-CM

## 2023-12-01 DIAGNOSIS — D696 Thrombocytopenia, unspecified: Secondary | ICD-10-CM

## 2023-12-01 DIAGNOSIS — R0981 Nasal congestion: Secondary | ICD-10-CM | POA: Diagnosis not present

## 2023-12-01 DIAGNOSIS — D472 Monoclonal gammopathy: Secondary | ICD-10-CM

## 2023-12-01 DIAGNOSIS — G119 Hereditary ataxia, unspecified: Secondary | ICD-10-CM

## 2023-12-01 DIAGNOSIS — J301 Allergic rhinitis due to pollen: Secondary | ICD-10-CM

## 2023-12-01 DIAGNOSIS — K219 Gastro-esophageal reflux disease without esophagitis: Secondary | ICD-10-CM

## 2023-12-01 DIAGNOSIS — D699 Hemorrhagic condition, unspecified: Secondary | ICD-10-CM

## 2023-12-01 DIAGNOSIS — I77819 Aortic ectasia, unspecified site: Secondary | ICD-10-CM

## 2023-12-01 DIAGNOSIS — Z8719 Personal history of other diseases of the digestive system: Secondary | ICD-10-CM

## 2023-12-01 DIAGNOSIS — Z5309 Procedure and treatment not carried out because of other contraindication: Secondary | ICD-10-CM

## 2023-12-01 DIAGNOSIS — R001 Bradycardia, unspecified: Secondary | ICD-10-CM

## 2023-12-01 DIAGNOSIS — G4733 Obstructive sleep apnea (adult) (pediatric): Secondary | ICD-10-CM

## 2023-12-01 DIAGNOSIS — R161 Splenomegaly, not elsewhere classified: Secondary | ICD-10-CM

## 2023-12-01 DIAGNOSIS — J329 Chronic sinusitis, unspecified: Secondary | ICD-10-CM

## 2023-12-01 LAB — COMPREHENSIVE METABOLIC PANEL
ALT: 44 U/L (ref 0–53)
AST: 34 U/L (ref 0–37)
Albumin: 3.5 g/dL (ref 3.5–5.2)
Alkaline Phosphatase: 178 U/L — ABNORMAL HIGH (ref 39–117)
BUN: 50 mg/dL — ABNORMAL HIGH (ref 6–23)
CO2: 25 meq/L (ref 19–32)
Calcium: 9.3 mg/dL (ref 8.4–10.5)
Chloride: 104 meq/L (ref 96–112)
Creatinine, Ser: 5.78 mg/dL (ref 0.40–1.50)
GFR: 10.66 mL/min — CL (ref >60.00)
Glucose, Bld: 96 mg/dL (ref 70–99)
Potassium: 3.5 meq/L (ref 3.5–5.1)
Sodium: 140 meq/L (ref 135–145)
Total Bilirubin: 0.8 mg/dL (ref 0.2–1.2)
Total Protein: 6.7 g/dL (ref 6.0–8.3)

## 2023-12-01 LAB — CBC WITH DIFFERENTIAL/PLATELET
Basophils Absolute: 0 10*3/uL (ref 0.0–0.1)
Basophils Relative: 0.9 % (ref 0.0–3.0)
Eosinophils Absolute: 0.4 10*3/uL (ref 0.0–0.7)
Eosinophils Relative: 6.2 % — ABNORMAL HIGH (ref 0.0–5.0)
HCT: 36.1 % — ABNORMAL LOW (ref 39.0–52.0)
Hemoglobin: 12.6 g/dL — ABNORMAL LOW (ref 13.0–17.0)
Lymphocytes Relative: 23.7 % (ref 12.0–46.0)
Lymphs Abs: 1.4 10*3/uL (ref 0.7–4.0)
MCHC: 34.8 g/dL (ref 30.0–36.0)
MCV: 99.6 fL (ref 78.0–100.0)
Monocytes Absolute: 0.8 10*3/uL (ref 0.1–1.0)
Monocytes Relative: 13 % — ABNORMAL HIGH (ref 3.0–12.0)
Neutro Abs: 3.2 10*3/uL (ref 1.4–7.7)
Neutrophils Relative %: 56.2 % (ref 43.0–77.0)
Platelets: 67 10*3/uL — ABNORMAL LOW (ref 150.0–400.0)
RBC: 3.63 Mil/uL — ABNORMAL LOW (ref 4.22–5.81)
RDW: 14.9 % (ref 11.5–15.5)
WBC: 5.8 10*3/uL (ref 4.0–10.5)

## 2023-12-01 LAB — URINALYSIS, ROUTINE W REFLEX MICROSCOPIC
Bilirubin Urine: NEGATIVE
Ketones, ur: NEGATIVE
Leukocytes,Ua: NEGATIVE
Nitrite: NEGATIVE
Specific Gravity, Urine: 1.015 (ref 1.000–1.030)
Total Protein, Urine: 100 — AB
Urine Glucose: 250 — AB
Urobilinogen, UA: 0.2 (ref 0.0–1.0)
pH: 6 (ref 5.0–8.0)

## 2023-12-01 LAB — GLUCOSE, POCT (MANUAL RESULT ENTRY): POC Glucose: 116 mg/dL — AB (ref 70–99)

## 2023-12-01 MED ORDER — AZELASTINE HCL 0.1 % NA SOLN: 2.0000 | Freq: Two times a day (BID) | NASAL | 12 refills | Status: DC

## 2023-12-01 MED ORDER — KETOCONAZOLE 2 % EX CREA: 1.0000 | TOPICAL_CREAM | Freq: Two times a day (BID) | CUTANEOUS | 0 refills | Status: DC

## 2023-12-01 MED ORDER — FLUTICASONE PROPIONATE 50 MCG/ACT NA SUSP: 2.0000 | Freq: Every day | NASAL | 6 refills | Status: DC

## 2023-12-01 MED ORDER — LOSARTAN POTASSIUM 50 MG PO TABS: 50.0000 mg | ORAL_TABLET | Freq: Every day | ORAL | 3 refills | Status: DC

## 2023-12-01 MED ORDER — TIRZEPATIDE 5 MG/0.5ML ~~LOC~~ SOAJ
5.0000 mg | SUBCUTANEOUS | 11 refills | Status: DC
  Filled 2023-12-01: qty 2, 28d supply, fill #0

## 2023-12-01 MED ORDER — TIRZEPATIDE 5 MG/0.5ML ~~LOC~~ SOAJ: 5.0000 mg | SUBCUTANEOUS | 3 refills | Status: DC

## 2023-12-01 NOTE — Telephone Encounter (Signed)
Dr. Jon Billings sent in a new prescription during his OV today.

## 2023-12-01 NOTE — Progress Notes (Signed)
 Reviewed labs from 12/01/2023. Creatinine 5.78/GFR 10.66 stable at baseline chronic level. Recent neurological symptoms (ataxia, tremor) largely resolved with rehydration. BP improved from 180/120 to 170s/70s. Increasing losartan  to 50mg  daily. Patient educated on fluid goals and warning signs. Detailed plan in Patient Message.

## 2023-12-01 NOTE — Progress Notes (Signed)
 Dear Dr. Jesus, Here is my assessment for our mutual patient, Sean [name]. Thank you for allowing me the opportunity to care for your patient. Please do not hesitate to contact me should you have any other questions. Sincerely, Dr. Eldora Blanch  Otolaryngology Clinic Note  HISTORY: Sean [name] is a 52 y.o. male kindly referred by Dr. Jesus for evaluation of sinus infections.   Initial visit (11/2023): He reports that he gets a sinus infection every spring and fall. Infection symptoms include some epistaxis, pressure around the eyes and a headache around the forehead like a band. Intermittent discolored drainage and PND. Sense of smell is ok. He had an infection in November 2024, got antibiotics and steroids. They clear up all of his symptoms and he has now cleared all his symptoms about 2 weeks ago, except for some intermittent headache (frontal). At times when his headache is bad, he wants to be in a cold/dark room. No sensitivity to light or sound. No history of migraines.    He has used flonase  before. Not used astelin . He reports he is on an allergy medication but does not know the name. Allergy medication helps, but sprays he does not think help.  Additional evaluation has included CT Sinus.  Allergy testing has been done (years ago - does not know exact results). He does report itchy eyes/nose and symptoms in spring and fall. No previous sinonasal surgery.  He is currently using nothing.  GLP-1: Mounjaro  AP/AC: Eliquis   PMHx: CAD s/p CABG, GERD, OSA, Liver dysfunction, CKD, Hypothyroid  RADIOGRAPHIC EVALUATION AND INDEPENDENT REVIEW OF OTHER RECORDS:: Dr. Jesus (11/02/2023):He reports sinus pressure, headaches, stuffy nose, and minor trauma-related inflammation in nasal passages, suggesting a possible refractory sinus infection, potentially complicated by CPAP use. We will order a CT scan of the sinuses, refer to an ENT for evaluation of the refractory sinus condition,  prescribe a different antibiotic for the sinus infection, and hold off on steroids due to the risk of exacerbating a potential ulcer. Reports sinus issue - pressure in sinus area, increased sleepiness. Does use CPAP for OSA. Dx: Sinsutiis, Rx defdinir, CT Sinus Stephanie Hudnell (10/08/2023): reports pressure behind eyes and sinus related sx (head going to explode). Attribute to allergies. Reports PND; Dx: Allergic rhinitis, sinusitis; Rx: Flonase , Steroid taper, saline spray, Z-pak. CT Sinus (11/2023): independently reviewed - clear paranasal sinuses; no significant septal deviation; does report symptoms during that time but was improving CBC w/diff (12/01/2023): WBC 5.8, Hgb 12.6, Eos 400 CMP 12/01/2023 - Cr 5.78, BUN 50; TSH 12/01/2023: wnl  Past Medical History:  Diagnosis Date   Abnormal liver enzymes    a. Sees a doctor in [place].   Cardiac cirrhosis    a. possible elevated LFTs/low platelets felt due to cardiac cirrhosis per 2017 admission.   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 s/p CABGx1 in 12/2015.   Gastric polyps 11/25/2022   Gastritis and gastroduodenitis 11/25/2022          Gastroesophageal reflux disease with esophagitis and hemorrhage 04/04/2021   Hematuria    a. Chronic hx of this, no prior etiology determined through workup per patient.   History of umbilical hernia repair 07/10/2011   History of hernia surgery twice prior   Hypercholesteremia     a. Prev taken off statin due to abnormal liver function.   Hypertension    Incisional hernia 07/10/2011   History of hernia surgery twice prior   Morbid obesity (HCC)    Morbid obesity with BMI of 50.0-59.9, adult (HCC) 01/04/2016   After [facility] doctor is setting him up with a wellness clinic to help with weight losS  He reports not having tried anything for weight loss either diet or or medicine or surgery in the past  We failed to get Wegovy  10/2022      Wt Readings from Last 10 Encounters:  11/13/22  (!) 384 lb 12.8 oz (174.5 kg)  10/13/22  (!) 385 lb (174.6 kg)  09/30/22  (!) 384 lb (174.2 kg)  09/24/22  (!) 382 lb (1   Nausea and vomiting 11/25/2022   OSA on CPAP    Paroxysmal atrial flutter (HCC) 02/12/2016   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   LIMA to LAD   Sinus bradycardia    Thrombocytopenia (HCC)    Past Surgical History:  Procedure Laterality Date   APPENDECTOMY     BIOPSY  02/19/2021   Procedure: BIOPSY;  Surgeon: Eda Iha, MD;  Location: [facility]OSCOPY;  Service: Gastroenterology;;   BIOPSY  11/25/2022   Procedure: BIOPSY;  Surgeon: Eda Iha, MD;  Location: [facility];  Service: Gastroenterology;;   CARDIAC CATHETERIZATION N/A 12/24/2015   Procedure: Right/Left Heart Cath and Coronary Angiography;  Surgeon: Toribio JONELLE Fuel, MD;  Location: [facility] INVASIVE CV LAB;  Service: Cardiovascular;  Laterality: N/A;   CARDIOVERSION N/A 02/14/2016   Procedure: CARDIOVERSION;  Surgeon: Vinie JAYSON Maxcy, MD;  Location: [facility] ENDOSCOPY;  Service: Cardiovascular;  Laterality: N/A;   COLONOSCOPY WITH PROPOFOL  N/A 02/19/2021   Procedure: COLONOSCOPY WITH PROPOFOL ;  Surgeon: Eda Iha, MD;  Location: [facility];  Service: Gastroenterology;  Laterality: N/A;   CORONARY ARTERY BYPASS GRAFT N/A 12/26/2015   Procedure: Off Pump Coronary Artery Bypass Grafting times one using left internal mammary artery;  Surgeon: Sudie VEAR Laine, MD;  Location: [facility] OR;  Service: Open Heart  Surgery;  Laterality: N/A;   ESOPHAGOGASTRODUODENOSCOPY (EGD) WITH PROPOFOL  N/A 02/19/2021   Procedure: ESOPHAGOGASTRODUODENOSCOPY (EGD) WITH PROPOFOL ;  Surgeon: Eda Iha, MD;  Location: [facility];  Service: Gastroenterology;  Laterality: N/A;   ESOPHAGOGASTRODUODENOSCOPY (EGD) WITH PROPOFOL  N/A 11/25/2022   Procedure: ESOPHAGOGASTRODUODENOSCOPY (EGD) WITH PROPOFOL ;  Surgeon: Eda Iha, MD;  Location: [facility];  Service: Gastroenterology;  Laterality: N/A;   GALLBLADDER SURGERY     HERNIA REPAIR     IR TRANSCATHETER BX  06/26/2023   IR US  GUIDE VASC ACCESS RIGHT  06/26/2023   IR VENOGRAM HEPATIC W HEMODYNAMIC EVALUATION  06/26/2023   LEFT HEART CATH AND CORS/GRAFTS ANGIOGRAPHY N/A 04/15/2017   Procedure: Left Heart Cath and Cors/Grafts Angiography;  Surgeon: Burnard Debby LABOR, MD;  Location: [facility] INVASIVE CV LAB;  Service: Cardiovascular;  Laterality: N/A;   LEFT HEART CATH AND CORS/GRAFTS ANGIOGRAPHY N/A 11/02/2020   Procedure:  LEFT HEART CATH AND CORS/GRAFTS ANGIOGRAPHY;  Surgeon: Wonda Sharper, MD;  Location[facility] INVASIVE CV LAB;  Service: Cardiovascular;  Laterality: N/A;   POLYPECTOMY  02/19/2021   Procedure: POLYPECTOMY;  Surgeon: Eda Iha, MD;  Location: [facility]OS[facility];  Service: Gastroenterology;;   TEE WITHOUT CARDIOVERSION N/A 12/26/2015   Procedure: TRANSESOPHAGEAL ECHOCARDIOGRAM (TEE);  Surgeon: Sudie VEAR Laine, MD;  Location: [facility] OR;  Service: Open Heart Surgery;  Laterality: N/A;   TEE WITHOUT CARDIOVERSION N/A 02/14/2016   Procedure: TRANSESOPHAGEAL ECHOCARDIOGRAM (TEE);  Surgeon: Vinie JAYSON Maxcy, MD;  Location: [facility] ENDOSCOPY;  Service: Cardiovascular;  Laterality: N/A;   Family History  Problem Relation Age of Onset   CAD Father        4 stents ~ 54   Diabetes Father    Diabetes Maternal Grandfather    Diabetes Maternal Uncle    Heart attack Other    Hyperlipidemia Other    Social History   Tobacco Use   Smoking status: Never   Smokeless tobacco:  Never  Substance Use Topics   Alcohol use: Yes    Comment: 6 beers per month   Allergies  Allergen Reactions   Vibra -Tab [Doxycycline ] Shortness Of Breath   Glucophage  [Metformin ] Other (See Comments)    Pt told by his nephrologist not to take this medication due to his kidney function.   Imdur  [Isosorbide  Dinitrate] Diarrhea   Valtrex  [Valacyclovir ] Itching   Augmentin [Amoxicillin-Pot Clavulanate] Itching   Tape Rash   Current Outpatient Medications  Medication Sig Dispense Refill   acetaminophen  (TYLENOL ) 500 MG tablet Take 1,500 mg by mouth once as needed for moderate pain (pain score 4-6), fever, headache or mild pain (pain score 1-3).     allopurinol  (ZYLOPRIM ) 100 MG tablet Take 1 tablet (100 mg total) by mouth daily.     amitriptyline  (ELAVIL ) 25 MG tablet Take 1 tablet (25 mg total) by mouth at bedtime. (Patient taking differently: Take 25 mg by mouth at bedtime.) 30 tablet 5   apixaban  (ELIQUIS ) 5 MG TABS tablet Take 1 tablet (5 mg total) by mouth 2 (two) times daily. 180 tablet 1   azelastine  (ASTELIN ) 0.1 % nasal spray Place 2 sprays into both nostrils 2 (two) times daily. Use in each nostril as directed 30 mL 12   Cholecalciferol (VITAMIN D3) 125 MCG (5000 UT) CAPS Take 1 capsule (5,000 Units total) by mouth daily at 12 noon. 90 capsule 1   Cyanocobalamin  (VITAMIN B-12 PO) Place 1 tablet under the tongue daily.     diltiazem  (CARDIZEM  SR) 120 MG 12 hr capsule Take 1 capsule (120 mg total) by mouth 2 (two) times daily. 180 capsule 1   docusate sodium  (COLACE) 100 MG capsule Take 2 capsules (200 mg total) by mouth 2 (two) times daily. 30 capsule 0   Evolocumab  (REPATHA  SURECLICK) 140 MG/ML SOAJ INJECT THE CONTENTS OF 1 SYRINGE UNDER THE SKIN EVERY 14 DAYS 6 mL 0   famotidine  (PEPCID ) 20 MG tablet Take 1 tablet (20 mg total) by mouth 2 (two) times daily. 180 tablet 0   famotidine -calcium  carbonate-magnesium  hydroxide (PEPCID  COMPLETE) 10-800-165 MG chewable tablet Chew 1 tablet  by mouth 2 (two) times daily as needed. 100 tablet 11   fluticasone  (FLONASE ) 50 MCG/ACT nasal spray Place 2 sprays into both nostrils daily. 15.8 mL 6   hydrocortisone  (ANUSOL -HC) 2.5 % rectal cream Apply rectally 2 (two) times daily. 30 g 0   JARDIANCE  10 MG TABS tablet TAKE 1 TABLET(10 MG) BY MOUTH DAILY BEFORE  BREAKFAST (Patient taking differently: Take 10 mg by mouth daily.) 90 tablet 3   ketoconazole  (NIZORAL ) 2 % cream Apply 1 Application topically 2 (two) times daily. 60 g 0   montelukast  (SINGULAIR ) 10 MG tablet TAKE 1 TABLET(10 MG) BY MOUTH DAILY (Patient taking differently: Take 10 mg by mouth daily.) 90 tablet 1   nitroGLYCERIN  (NITROSTAT ) 0.4 MG SL tablet Place 1 tablet (0.4 mg total) under the tongue every 5 (five) minutes as needed for chest pain. 25 tablet 6   pantoprazole  (PROTONIX ) 40 MG tablet TAKE 1 TABLET(40 MG) BY MOUTH TWICE DAILY (Patient taking differently: Take 40 mg by mouth 2 (two) times daily.) 180 tablet 0   polyethylene glycol (MIRALAX  / GLYCOLAX ) 17 g packet Mix 17 grams (1 packet) in 8 ounces of fluid and take by mouth daily. 14 each 0   sucralfate  (CARAFATE ) 1 g tablet TAKE 1 TABLET(1 GRAM) BY MOUTH TWICE DAILY. MAY INCREASE TO 4 TIMES DAILY FOR ANY ACUTE SYMPTOMS (Patient taking differently: Take 1 tablet by mouth at bedtime.) 60 tablet 2   tirzepatide  (MOUNJARO ) 2.5 MG/0.5ML Pen Inject 2.5 mg into the skin once a week. 2 mL 3   tirzepatide  (MOUNJARO ) 5 MG/0.5ML Pen Inject 5 mg into the skin once a week. 6 mL 3   tirzepatide  (MOUNJARO ) 5 MG/0.5ML Pen Inject 5 mg into the skin once a week. 6 mL 11   torsemide  (DEMADEX ) 10 MG tablet Take 1 tablet (10 mg total) by mouth daily. 30 tablet 0   cefdinir  (OMNICEF ) 300 MG capsule Take 300 mg by mouth 2 (two) times daily. (Patient not taking: Reported on 12/01/2023)     losartan  (COZAAR ) 50 MG tablet Take 1 tablet (50 mg total) by mouth daily. 90 tablet 3   potassium chloride  (KLOR-CON  M) 10 MEQ tablet TAKE 1 TABLET(10 MEQ)  BY MOUTH DAILY 90 tablet 1   triamcinolone  (NASACORT ) 55 MCG/ACT AERO nasal inhaler Place 1 spray into the nose daily. Start with 1 spray each side twice a day for 3 days, then reduce to daily. (Patient not taking: Reported on 12/01/2023) 1 each 2   No current facility-administered medications for this visit.   Facility-Administered Medications Ordered in Other Visits  Medication Dose Route Frequency Provider Last Rate Last Admin   technetium tetrofosmin  (TC-MYOVIEW ) injection 29.8 millicurie  29.8 millicurie Intravenous Once PRN Hilty, Vinie BROCKS, MD       BP (!) 156/76 (BP Location: Right Arm, Patient Position: Sitting, Cuff Size: Normal)   Pulse 68   Resp 19   Ht 5' 11 (1.803 m)   Wt (!) 391 lb (177.4 kg)   SpO2 94%   BMI 54.53 kg/m   PHYSICAL EXAM:  BP (!) 156/76 (BP Location: Right Arm, Patient Position: Sitting, Cuff Size: Normal)   Pulse 68   Resp 19   Ht 5' 11 (1.803 m)   Wt (!) 391 lb (177.4 kg)   SpO2 94%   BMI 54.53 kg/m   Salient findings:  CN II-XII intact  Bilateral EAC clear and TM intact with well pneumatized middle ear spaces Nose: Anterior rhinoscopy reveals relatively midline nasal septum, bilateral inferior turbinates with mild hypertrophy.  Nasal endoscopy was indicated to better evaluate the nose and paranasal sinuses, given the patient's history and exam findings, and is detailed below. No lesions of oral cavity/oropharynx; No obviously palpable neck masses/lymphadenopathy/thyromegaly No respiratory distress or stridor   PROCEDURE: Diagnostic Nasal Endoscopy Pre-procedure diagnosis: Concern for chronic sinusitis Post-procedure diagnosis: same Indication: See pre-procedure  diagnosis and physical exam above Complications: None apparent EBL: 0 mL Anesthesia: Lidocaine  4% and topical decongestant was topically sprayed in each nasal cavity  Description of Procedure:  Patient was identified. A rigid 30 degree endoscope was utilized to evaluate the  sinonasal cavities, mucosa, sinus ostia and turbinates and septum.  Overall, signs of mucosal inflammation are not noted.  No mucopurulence, polyps, or masses noted.   Right Middle meatus: Clear Right SE Recess: Clear Left MM: Clear Left SE Recess: Clear    Photodocumentation was obtained.  CPT CODE -- 68768 - Mod 25   ASSESSMENT:  52 y.o. with multiple comorbidity with: 1. Other acute recurrent sinusitis   2. Nasal congestion   3. Hypertrophy of both inferior nasal turbinates    Symptoms have resolved after rounds of antibiotics.  He is asymptomatic and recent CT and current endoscopy is overall reassuring  We've discussed issues and options today.  We reviewed the nasal endoscopy images together.  The risks, benefits and alternatives were discussed and questions answered.  Multiple medical comorbidities and reassuring CT as well as relatively infrequent flares, would recommend medical management.  He has elected to proceed with:  - Start Flonase  BID and Astelin  BID (some AR sx) - Daily sinuses rinses - f/u in 6 months  See below regarding exact medications prescribed this encounter including dosages and route: Meds ordered this encounter  Medications   fluticasone  (FLONASE ) 50 MCG/ACT nasal spray    Sig: Place 2 sprays into both nostrils daily.    Dispense:  15.8 mL    Refill:  6   azelastine  (ASTELIN ) 0.1 % nasal spray    Sig: Place 2 sprays into both nostrils 2 (two) times daily. Use in each nostril as directed    Dispense:  30 mL    Refill:  12     Thank you for allowing me the opportunity to care for your patient. Please do not hesitate to contact me should you have any other questions.  Sincerely, Eldora Blanch, MD Otolarynoglogist (ENT), [facility] Phone: [phone] Fax: [phone]  MDM:  Level 4: [phone] Complexity/Problems addressed: mod - multiple chronic problems Data complexity: mod - independent interpretation of CT imaging; review of  notes and labs - Morbidity: mod  - Prescription Drug prescribed or managed: yes  12/12/2023, 6:01 PM

## 2023-12-01 NOTE — Patient Instructions (Signed)
Use flonase two sprays each nostril twice per day Use astelin two sprays each nostril twice per day F/u 6 months

## 2023-12-01 NOTE — Assessment & Plan Note (Signed)
 Hypertension (I10) Assessment: Blood pressure elevated with recent readings showing systolic consistently >150, improved from 180/120 yesterday to 170s systolic today. Multiple agents including diltiazem  and losartan  currently prescribed.  Plan:  Increase losartan  to 50mg  daily from 25mg  Continue other antihypertensive medications unchanged Home BP monitoring with diary May take additional losartan  if BP remains elevated Target BP <140/90

## 2023-12-01 NOTE — Telephone Encounter (Signed)
 Telephone lab report: Sean [name] from [facility] called to report critical lab values:  Creatine: 5.78 GFR: 10.66  Office closed for holiday. RN called on call doctor, Sean [name] at [phone] and left message for him to return our call to give results.  Spoke with dr. No further instructions.

## 2023-12-01 NOTE — Telephone Encounter (Signed)
Patient was seen by Dr. Jon Billings concerning this today.

## 2023-12-01 NOTE — Patient Instructions (Signed)
 PATIENT INSTRUCTIONS  Your Recent Health Issues: You recently experienced dizziness, tremors, and unsteady walking which have improved today. While these symptoms are likely related to your medications and kidney function, we are ordering additional testing to ensure there isn't a more serious cause.  Medication Changes: 1. RESUME Jardiance  as previously prescribed 2. INCREASE losartan  to 50mg  daily (up from 25mg ) for blood pressure 3. NEW antifungal cream - apply to affected areas of face twice daily  Important Instructions: 1. Fluid Intake:    - Drink at least 1.5 liters (about 6-8 cups) of water daily    - Keep track of your fluid intake    - Increase fluids if symptoms return  2. Blood Pressure Monitoring:    - Check blood pressure daily    - Record readings in a diary    - Call if systolic (top number) is over 819 or below 100  3. CPAP Care:    - Use mask liner to reduce moisture (can purchase online)    - Clean CPAP equipment daily    - Apply prescribed antifungal cream morning and night  WARNING SIGNS - Seek Immediate Medical Attention if: - Severe dizziness or inability to walk - Severe tremors or difficulty using hands - Confusion or extreme fatigue - Chest pain or severe shortness of breath - Blood pressure over 180/120 with symptoms  Follow-Up Plans: 1. ENT appointment today at 4pm 2. Blood work today before leaving 3. Return to clinic in 2-4 weeks 4. MRI to be scheduled (open MRI)  Call Our Office If: - Symptoms return or worsen - Unable to take medications as prescribed - Questions about medication adjustments - Problems with CPAP or skin irritation - Other new or concerning symptoms  Remember: - Continue all other medications as prescribed - Maintain use of compression stockings - Monitor for swelling in legs - Keep track of daily weights - Continue regular blood sugar monitoring

## 2023-12-01 NOTE — Progress Notes (Signed)
 ==============================  [facility]: [phone]   -- Medical Office Visit --  Patient: Sean [name]      Age: 52 y.o.       Sex:  male  Date:   12/01/2023 Today's Healthcare Provider: Bernardino KANDICE Cone, MD  ==============================   CHIEF COMPLAINT: Shaking (Not currently.), Hypertension (Elevated BP of 160/108 yesterday. Blood sugar of 214.), Dizziness (When standing up previously. Slightly since Sunday, intermittent.), and Emesis (When standing up, last episode was Sunday.)    SUBJECTIVE: 52 y.o. male who has History of umbilical hernia repair; Hypertension; Hyperlipidemia; Thrombocytopenia (HCC); Sinus bradycardia; CAD (coronary artery disease); Atrial flutter with rapid ventricular response (HCC); Morbid obesity (HCC); Chronic kidney disease, stage 4 (severe) (HCC); Other long term (current) drug therapy; History of coronary artery bypass surgery; Obstructive sleep apnea syndrome; Chronic gout of multiple sites; Alkaline phosphatase elevation; Ectatic aorta (HCC); Gastroesophageal reflux disease without esophagitis; Herpes simplex; Metabolic dysfunction-associated steatohepatitis (MASH); Prediabetes; Statins contraindicated; Hypothyroid; Chronic venous stasis dermatitis of both lower extremities; Nausea and vomiting; Chronic liver failure without hepatic coma (HCC); Splenomegaly; Spondylolisthesis; Bleeding diathesis (HCC); Malaise and fatigue; Allergic rhinitis due to pollen; Amitriptyline  adverse reaction; MGUS (monoclonal gammopathy of unknown significance); 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  History of Present Illness:  Patient presents for follow-up of acute neurological symptoms that began yesterday evening. He reported experiencing episodic staggers with unsteady gait, tremors affecting fine motor control, and  transient dizziness. Initial blood pressure reading was 180/120 with associated diaphoresis and temperature dysregulation. Symptoms began approximately 10 days ago but worsened significantly yesterday, prompting after-hours triage contact.  After telephone guidance to hold Jardiance  and increase fluid intake overnight, patient reports significant improvement in symptoms today. He denies any persistent tremors or staggering. He continues to note some unsteadiness with prolonged standing, particularly in the shower, which he attributes partially to deconditioning.  Notable exam findings today include nystagmus and slight dysmetria on finger-nose testing with the left hand on first attempt, though improved on subsequent testing. No orthostatic hypotension was elicited. Blood pressure has improved to 170s systolic.  Symptoms developed in the context of complex medical comorbidities including Stage 4 CKD (GFR <20), morbid obesity (current weight 391 lbs), CAD status post CABG, and recent medication adjustments including transition from furosemide  to torsemide  for volume management. Patient is currently treated with multiple agents including Jardiance , torsemide , diltiazem , losartan , and amitriptyline  among others.  Review of recent blood pressures shows significant variability with systolic readings ranging from 110-177 over the past year, with multiple elevated readings >150 systolic noted in recent months. Patient reports good medication compliance and denies chest pain, shortness of breath, headache, visual changes, focal weakness, or other acute cardiopulmonary or neurological symptoms.  Note that patient  has a past medical history of Abnormal liver enzymes, Cardiac cirrhosis, Chronic combined systolic and diastolic CHF (congestive heart failure) (HCC), CKD (chronic kidney disease) stage 3, GFR 30-59 ml/min (HCC) (01/04/2016), CKD (chronic kidney disease), stage II, Congenital heart defect, Coronary artery  disease, Gastric polyps (11/25/2022), Gastritis and gastroduodenitis (11/25/2022), Gastroesophageal reflux disease with esophagitis and hemorrhage (04/04/2021), Hematuria, History of umbilical hernia repair (07/10/2011), Hypercholesteremia, Hypertension, Incisional hernia (07/10/2011), Morbid obesity (HCC), Morbid obesity with BMI of 50.0-59.9, adult (HCC) (01/04/2016), Nausea and vomiting (11/25/2022), OSA on CPAP, Paroxysmal atrial flutter (HCC) (02/12/2016), 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  Problem list overviews  that were updated at today's visit:No problems updated.  Med reconciliation: Current Outpatient Medications on File Prior to Visit  Medication Sig   acetaminophen  (TYLENOL ) 500 MG tablet Take 1,500 mg by mouth once as needed for moderate pain (pain score 4-6), fever, headache or mild pain (pain score 1-3).   allopurinol  (ZYLOPRIM ) 100 MG tablet Take 1 tablet (100 mg total) by mouth daily.   amitriptyline  (ELAVIL ) 25 MG tablet Take 1 tablet (25 mg total) by mouth at bedtime. (Patient taking differently: Take 25 mg by mouth at bedtime.)   apixaban  (ELIQUIS ) 5 MG TABS tablet Take 1 tablet (5 mg total) by mouth 2 (two) times daily.   cefdinir  (OMNICEF ) 300 MG capsule Take 300 mg by mouth 2 (two) times daily. (Patient not taking: Reported on 12/01/2023)   Cholecalciferol (VITAMIN D3) 125 MCG (5000 UT) CAPS Take 1 capsule (5,000 Units total) by mouth daily at 12 noon.   Cyanocobalamin  (VITAMIN B-12 PO) Place 1 tablet under the tongue daily.   diltiazem  (CARDIZEM  SR) 120 MG 12 hr capsule Take 1 capsule (120 mg total) by mouth 2 (two) times daily.   docusate sodium  (COLACE) 100 MG capsule Take 2 capsules (200 mg total) by mouth 2 (two) times daily.   Evolocumab  (REPATHA  SURECLICK) 140 MG/ML SOAJ INJECT THE CONTENTS OF 1 SYRINGE UNDER THE SKIN EVERY 14 DAYS   famotidine  (PEPCID ) 20 MG tablet Take 1 tablet (20 mg total) by mouth 2 (two) times  daily.   famotidine -calcium  carbonate-magnesium  hydroxide (PEPCID  COMPLETE) 10-800-165 MG chewable tablet Chew 1 tablet by mouth 2 (two) times daily as needed.   hydrocortisone  (ANUSOL -HC) 2.5 % rectal cream Apply rectally 2 (two) times daily.   JARDIANCE  10 MG TABS tablet TAKE 1 TABLET(10 MG) BY MOUTH DAILY BEFORE BREAKFAST (Patient taking differently: Take 10 mg by mouth daily.)   losartan  (COZAAR ) 25 MG tablet TAKE 1 TABLET(25 MG) BY MOUTH DAILY (Patient not taking: Reported on 12/01/2023)   montelukast  (SINGULAIR ) 10 MG tablet TAKE 1 TABLET(10 MG) BY MOUTH DAILY (Patient taking differently: Take 10 mg by mouth daily.)   nitroGLYCERIN  (NITROSTAT ) 0.4 MG SL tablet Place 1 tablet (0.4 mg total) under the tongue every 5 (five) minutes as needed for chest pain.   pantoprazole  (PROTONIX ) 40 MG tablet TAKE 1 TABLET(40 MG) BY MOUTH TWICE DAILY (Patient taking differently: Take 40 mg by mouth 2 (two) times daily.)   polyethylene glycol (MIRALAX  / GLYCOLAX ) 17 g packet Mix 17 grams (1 packet) in 8 ounces of fluid and take by mouth daily.   sucralfate  (CARAFATE ) 1 g tablet TAKE 1 TABLET(1 GRAM) BY MOUTH TWICE DAILY. MAY INCREASE TO 4 TIMES DAILY FOR ANY ACUTE SYMPTOMS (Patient taking differently: Take 1 tablet by mouth at bedtime.)   tirzepatide  (MOUNJARO ) 2.5 MG/0.5ML Pen Inject 2.5 mg into the skin once a week.   torsemide  (DEMADEX ) 10 MG tablet Take 1 tablet (10 mg total) by mouth daily.   triamcinolone  (NASACORT ) 55 MCG/ACT AERO nasal inhaler Place 1 spray into the nose daily. Start with 1 spray each side twice a day for 3 days, then reduce to daily. (Patient not taking: Reported on 12/01/2023)   Current Facility-Administered Medications on File Prior to Visit  Medication   technetium tetrofosmin  (TC-MYOVIEW ) injection 29.8 millicurie  There are no discontinued medications.    Objective   Physical Exam     12/01/2023    3:48 PM 12/01/2023   12:08 PM 12/01/2023   11:51 AM  Vitals with BMI  Height 5' 11  5' 11  Weight 391 lbs  391 lbs 6 oz  BMI 54.56  54.61  Systolic 156 154 854  Diastolic 76 72 105  Pulse 68  61   Wt Readings from Last 10 Encounters:  12/01/23 (!) 391 lb (177.4 kg)  12/01/23 (!) 391 lb 6.4 oz (177.5 kg)  11/09/23 (!) 394 lb 6.4 oz (178.9 kg)  11/04/23 (!) 396 lb 13.3 oz (180 kg)  11/02/23 (!) 391 lb 12.8 oz (177.7 kg)  10/08/23 (!) 384 lb (174.2 kg)  07/27/23 (!) 382 lb 14.4 oz (173.7 kg)  07/02/23 (!) 370 lb (167.8 kg)  06/17/23 (!) 377 lb 3.3 oz (171.1 kg)  05/04/23 (!) 374 lb (169.6 kg)   Vital signs reviewed.  Nursing notes reviewed. Weight trend reviewed. Abnormalities and Problem-Specific physical exam findings:  truncal adiposity nystagmus, no significant past pointing, no gait disturbance, negative romberg, no tremor today  General Appearance:  No acute distress appreciable.   Well-groomed, healthy-appearing male.  Well proportioned with no abnormal fat distribution.  Good muscle tone. Pulmonary:  Normal work of breathing at rest, no respiratory distress apparent. SpO2: 98 %  Musculoskeletal: All extremities are intact.  Neurological:  Awake, alert, oriented, and engaged.  No obvious focal neurological deficits or cognitive impairments.  Sensorium seems unclouded.   Speech is clear and coherent with logical content. Psychiatric:  Appropriate mood, pleasant and cooperative demeanor, thoughtful and engaged during the exam    Results for orders placed or performed in visit on 12/01/23  CBC with Differential/Platelet  Result Value Ref Range   WBC 5.8 4.0 - 10.5 K/uL   RBC 3.63 (L) 4.22 - 5.81 Mil/uL   Hemoglobin 12.6 (L) 13.0 - 17.0 g/dL   HCT 63.8 (L) 60.9 - 47.9 %   MCV 99.6 78.0 - 100.0 fl   MCHC 34.8 30.0 - 36.0 g/dL   RDW 85.0 88.4 - 84.4 %   Platelets 67.0 (L) 150.0 - 400.0 K/uL   Neutrophils Relative % 56.2 43.0 - 77.0 %   Lymphocytes Relative 23.7 12.0 - 46.0 %   Monocytes Relative 13.0 (H) 3.0 - 12.0 %   Eosinophils Relative  6.2 (H) 0.0 - 5.0 %   Basophils Relative 0.9 0.0 - 3.0 %   Neutro Abs 3.2 1.4 - 7.7 K/uL   Lymphs Abs 1.4 0.7 - 4.0 K/uL   Monocytes Absolute 0.8 0.1 - 1.0 K/uL   Eosinophils Absolute 0.4 0.0 - 0.7 K/uL   Basophils Absolute 0.0 0.0 - 0.1 K/uL  Comp Met (CMET)  Result Value Ref Range   Sodium 140 135 - 145 mEq/L   Potassium 3.5 3.5 - 5.1 mEq/L   Chloride 104 96 - 112 mEq/L   CO2 25 19 - 32 mEq/L   Glucose, Bld 96 70 - 99 mg/dL   BUN 50 (H) 6 - 23 mg/dL   Creatinine, Ser 4.21 (HH) 0.40 - 1.50 mg/dL   Total Bilirubin 0.8 0.2 - 1.2 mg/dL   Alkaline Phosphatase 178 (H) 39 - 117 U/L   AST 34 0 - 37 U/L   ALT 44 0 - 53 U/L   Total Protein 6.7 6.0 - 8.3 g/dL   Albumin  3.5 3.5 - 5.2 g/dL   GFR 89.33 (LL) >39.99 mL/min   Calcium  9.3 8.4 - 10.5 mg/dL  Urinalysis, Routine w reflex microscopic  Result Value Ref Range   Color, Urine YELLOW Yellow;Lt. Yellow;Straw;Dark Yellow;Amber;Green;Red;Brown   APPearance CLEAR Clear;Turbid;Slightly Cloudy;Cloudy   Specific Gravity, Urine  1.015 1.000 - 1.030   pH 6.0 5.0 - 8.0   Total Protein, Urine 100 (A) Negative   Urine Glucose 250 (A) Negative   Ketones, ur NEGATIVE Negative   Bilirubin Urine NEGATIVE Negative   Hgb urine dipstick SMALL (A) Negative   Urobilinogen, UA 0.2 0.0 - 1.0   Leukocytes,Ua NEGATIVE Negative   Nitrite NEGATIVE Negative   WBC, UA 0-2/hpf 0-2/hpf   RBC / HPF 0-2/hpf 0-2/hpf   Squamous Epithelial / HPF Rare(0-4/hpf) Rare(0-4/hpf)  POCT glucose (manual entry)  Result Value Ref Range   POC Glucose 116 (A) 70 - 99 mg/dl   No image results found. CT SINUS WO CONTRAST Result Date: 11/24/2023 CLINICAL DATA:  Recurring sinus infection EXAM: CT MAXILLOFACIAL WITHOUT CONTRAST TECHNIQUE: Multidetector CT images of the paranasal sinuses were obtained using the standard protocol without intravenous contrast. RADIATION DOSE REDUCTION: This exam was performed according to the departmental dose-optimization program which includes  automated exposure control, adjustment of the mA and/or kV according to patient size and/or use of iterative reconstruction technique. COMPARISON:  No prior CT sinus or maxillofacial available. Correlation is made with CT head 10/01/2010 FINDINGS: Paranasal sinuses: Frontal: Normally aerated. Patent frontal sinus drainage pathways. Ethmoid: Normally aerated. Maxillary: Normally aerated. Sphenoid: Normally aerated. Patent sphenoethmoidal recesses. No evidence of osseous thickening or erosion. Right ostiomeatal unit: Patent. Left ostiomeatal unit: Patent. Nasal passages: Patent. Intact nasal septum is midline. Anatomy: No pneumatization superior to anterior ethmoid notches. Symmetric and intact olfactory grooves and fovea ethmoidalis, Keros II (4-39mm). Conchal sphenoid pneumatization pattern. No dehiscence of carotid or optic canals. No onodi cell. Other: Orbits and intracranial compartment are unremarkable. Visible mastoid air cells are normally aerated. IMPRESSION: Normally aerated paranasal sinuses. Patent sinus drainage pathways. No evidence of acute or chronic sinusitis. Electronically Signed   By: Donald Campion M.D.   On: 11/24/2023 20:40   DG Chest 2 View Result Date: 11/16/2023 CLINICAL DATA:  52 year old male with fluid overload EXAM: CHEST - 2 VIEW COMPARISON:  11/02/2023 FINDINGS: Cardiomediastinal silhouette unchanged with surgical changes of median sternotomy and CABG. No pneumothorax pleural effusion or confluent airspace disease. Similar appearance of asymmetric elevation the left hemidiaphragm. Coarse interstitial markings. Degenerative changes the spine.  No displaced fracture IMPRESSION: Negative for acute cardiopulmonary disease Electronically Signed   By: Ami Bellman D.O.   On: 11/16/2023 10:20   CT ABDOMEN PELVIS WO CONTRAST Result Date: 11/02/2023 CLINICAL DATA:  Acute nonlocalized abdominal pain. Right red blood in stool this morning. EXAM: CT ABDOMEN AND PELVIS WITHOUT CONTRAST  TECHNIQUE: Multidetector CT imaging of the abdomen and pelvis was performed following the standard protocol without IV contrast. RADIATION DOSE REDUCTION: This exam was performed according to the departmental dose-optimization program which includes automated exposure control, adjustment of the mA and/or kV according to patient size and/or use of iterative reconstruction technique. COMPARISON:  01/26/2023 FINDINGS: Lower chest: Lung bases are clear. Postoperative changes in the mediastinum. Elevation of the left hemidiaphragm is unchanged. Hepatobiliary: No focal liver abnormality is seen. Status post cholecystectomy. No biliary dilatation. Pancreas: Unremarkable. No pancreatic ductal dilatation or surrounding inflammatory changes. Spleen: Normal in size without focal abnormality. Adrenals/Urinary Tract: Adrenal glands are unremarkable. Kidneys are normal, without renal calculi, focal lesion, or hydronephrosis. Bladder is unremarkable. Stomach/Bowel: Stomach, small bowel, and colon are not abnormally distended. Scattered stool in the colon. No wall thickening or inflammatory changes. Appendix is not identified. Vascular/Lymphatic: Aortic atherosclerosis. No enlarged abdominal or pelvic lymph nodes. Reproductive: Prostate gland is atrophic or  surgically absent. No pelvic mass or lymphadenopathy. Other: No free air or free fluid in the abdomen. Small periumbilical hernia containing fat. Cystic structure in the left periumbilical space measuring 4 cm diameter. This is likely a sebaceous cyst. No change since previous study. Musculoskeletal: Degenerative changes in the spine. 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 IMPRESSION: 1. No acute process demonstrated in the abdomen or pelvis. No significant change since previous study. 2. Chronic elevation of the left hemidiaphragm. 3. Mild aortic atherosclerosis. 4. Cystic structure adjacent to the umbilicus likely representing a sebaceous cyst. No change.  Electronically Signed   By: Elsie Gravely M.D.   On: 11/02/2023 20:15   DG Chest Portable 1 View Result Date: 11/02/2023 CLINICAL DATA:  Shortness of breath. Bright red blood in stool this morning. Midsternal chest pain. Fatigue. EXAM: PORTABLE CHEST 1 VIEW COMPARISON:  01/26/2023 FINDINGS: Postoperative changes in the mediastinum with sternotomy wires present. Elevation of the left hemidiaphragm is unchanged since prior study. Lungs are clear. No pleural effusions. No pneumothorax. Mediastinal contours appear intact. IMPRESSION: Elevation of left hemidiaphragm is unchanged.  Lungs are clear. Electronically Signed   By: Elsie Gravely M.D.   On: 11/02/2023 20:10  CT SINUS WO CONTRAST Result Date: 11/24/2023 CLINICAL DATA:  Recurring sinus infection EXAM: CT MAXILLOFACIAL WITHOUT CONTRAST TECHNIQUE: Multidetector CT images of the paranasal sinuses were obtained using the standard protocol without intravenous contrast. RADIATION DOSE REDUCTION: This exam was performed according to the departmental dose-optimization program which includes automated exposure control, adjustment of the mA and/or kV according to patient size and/or use of iterative reconstruction technique. COMPARISON:  No prior CT sinus or maxillofacial available. Correlation is made with CT head 10/01/2010 FINDINGS: Paranasal sinuses: Frontal: Normally aerated. Patent frontal sinus drainage pathways. Ethmoid: Normally aerated. Maxillary: Normally aerated. Sphenoid: Normally aerated. Patent sphenoethmoidal recesses. No evidence of osseous thickening or erosion. Right ostiomeatal unit: Patent. Left ostiomeatal unit: Patent. Nasal passages: Patent. Intact nasal septum is midline. Anatomy: No pneumatization superior to anterior ethmoid notches. Symmetric and intact olfactory grooves and fovea ethmoidalis, Keros II (4-13mm). Conchal sphenoid pneumatization pattern. No dehiscence of carotid or optic canals. No onodi cell. Other: Orbits and  intracranial compartment are unremarkable. Visible mastoid air cells are normally aerated. IMPRESSION: Normally aerated paranasal sinuses. Patent sinus drainage pathways. No evidence of acute or chronic sinusitis. Electronically Signed   By: Donald Campion M.D.   On: 11/24/2023 20:40       Assessment & Plan Cerebellar ataxia (HCC) Acute Cerebellar Dysfunction (R27.0) Assessment: Patient presented with transient cerebellar symptoms including ataxia, tremor, and dizziness, now largely resolved. Exam notable for persistent nystagmus and mild dysmetria. Most likely etiology is metabolic/toxic given severe CKD and complex medication regimen. Dehydration from combined Jardiance  and diuretic therapy may have contributed through reduced renal clearance and increased uremia. Alternative etiologies including structural lesions require consideration given persistence of nystagmus.  Plan:  Order non-contrast open MRI brain to evaluate for structural pathology Resume Jardiance  with increased fluid intake target of 1.5L daily minimum Check comprehensive metabolic panel and BUN today Monitor for symptom recurrence; hold Jardiance  and increase fluids if symptoms return Patient educated on warning signs requiring urgent evaluation Follow-up in 2-4 weeks sooner if symptoms recur  Tremor  Nystagmus  Staggering  Primary hypertension Hypertension (I10) Assessment: Blood pressure elevated with recent readings showing systolic consistently >150, improved from 180/120 yesterday to 170s systolic today. Multiple agents including diltiazem  and losartan  currently prescribed.  Plan:  Increase losartan  to 50mg  daily from  25mg  Continue other antihypertensive medications unchanged Home BP monitoring with diary May take additional losartan  if BP remains elevated Target BP <140/90 Seborrheic dermatitis Seborrheic Dermatitis (L21.9) Assessment: Facial dermatitis likely related to moisture accumulation from CPAP  mask.  Plan:  Prescribe antifungal cream for twice daily application Recommend CPAP mask liner to reduce moisture accumulation Patient education on proper CPAP cleaning and maintenance Consider follow-up if no improvement in 2-4 weeks Metabolic dysfunction-associated steatohepatitis (MASH)  Prediabetes  Coronary artery disease involving native coronary artery of native heart without angina pectoris  Chronic kidney disease, stage 4 (severe) (HCC) Stage 4 Chronic Kidney Disease (N18.4) Assessment: Severe CKD with GFR <20, currently managed without dialysis. Recent transition from furosemide  to torsemide . Current symptoms raise concern for possible uremic encephalopathy vs medication effects.  Plan:  Continue current medications including torsemide  and Jardiance  with close monitoring Check electrolytes, BUN/Cr, eGFR today Ensure follow-up with new nephrologist Dr. Tobie Defer fistula placement per recent nephrology recommendations Patient educated on symptoms of uremia/volume overload Gastroesophageal reflux disease without esophagitis  History of coronary artery bypass surgery  Mixed hyperlipidemia  Morbid obesity (HCC)  Obstructive sleep apnea syndrome  Thrombocytopenia (HCC)  Statins contraindicated  Spondylolisthesis of lumbar region  Splenomegaly  Sinus bradycardia  Recurrent sinusitis  Rectal bleeding  Other long term (current) drug therapy  Nausea and vomiting, unspecified vomiting type  MGUS (monoclonal gammopathy of unknown significance)  Malaise and fatigue  Hypothyroidism, unspecified type  History of umbilical hernia repair  Herpes simplex  Ectatic aorta (HCC)  Chronic venous stasis dermatitis of both lower extremities  Chronic liver failure without hepatic coma (HCC)  Chronic gout of multiple sites, unspecified cause  Bleeding diathesis (HCC)  Atrial flutter with rapid ventricular response (HCC)  Adverse effect of amitriptyline ,  sequela  Seasonal allergic rhinitis due to pollen  Alkaline phosphatase elevation  AKI (acute kidney injury) (HCC)  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  No contrast due to advanced kidney disease.  Weight 391 pounds.    What is the patient's sedation requirement?:   No Sedation    Does the patient have a pacemaker or implanted devices?:   No    Preferred imaging location?:   External   CBC with Differential/Platelet   Comp Met (CMET)   TSH + free T4   Urinalysis, Routine w reflex microscopic   POCT glucose (manual entry)   Meds ordered this encounter  Medications   losartan  (COZAAR ) 50 MG tablet    Sig: Take 1 tablet (50 mg total) by mouth daily.    Dispense:  90 tablet    Refill:  3   DISCONTD: ketoconazole  (NIZORAL ) 2 % cream    Sig: Apply 1 Application topically 2 (two) times daily.    Dispense:  60 g    Refill:  0   tirzepatide  (MOUNJARO ) 5 MG/0.5ML Pen    Sig: Inject 5 mg into the skin once a week.    Dispense:  6 mL    Refill:  3   tirzepatide  (MOUNJARO ) 5 MG/0.5ML Pen    Sig: Inject 5 mg into the skin once  a week.    Dispense:  6 mL    Refill:  11   General Health Maintenance   Continue current medications including Mounjaro  for weight management ENT follow-up today at 4pm for chronic cough evaluation Consider dose reduction of amitriptyline  given renal dysfunction Encouraged continuation of compression stockings for chronic venous insufficiency Reinforce fluid goals and medication compliance  Follow-up in 2-4 weeks with interval labs. Return sooner for worsening neurological symptoms, severe hypertension, or other concerns.  Attestation:  I have  personally spent  52 minutes involved in face-to-face and non-face-to-face activities for this patient on the day of the visit. Professional time spent includes the following activities:  Preparing to see the patient by reviewing medical records prior to and during the encounter; Obtaining, documenting, and reviewing an updated medical history; Performing a medically appropriate examination;  Evaluating, synthesizing, and documenting the available clinical information in the EMR;  Coordinating/Communicating with other health care professionals; Independently interpreting results (not separately reported), Communicating, counseling, educating about results to the patient/family/caregiver (not separately reported); Collaboratively developing and communicating an individualized treatment plan with the patient; Placing medically necessary orders (for medications/tests/procedures/referrals);   This time was independent of any separately billable procedure(s).  The extended duration of this patient visit was medically necessary due to several factors:  The patient's health condition is multifaceted, requiring a comprehensive evaluation of patient and their past records to ensure accurate diagnosis and treatment planning; Effective patient education and communication, particularly for patients with complex care needs, often require additional time to ensure the patient (or caregivers) fully understand the care plan;  Coordination of care with other healthcare professionals and services depends on thorough documentation, extending both documentation time and visit durations.  All these factors are integral to providing high-quality patient care and ensuring optimal health outcomes.  MEDICAL DECISION MAKING ATTESTATION  Level of MDM: High Complexity  1. 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 Amount and/or Complexity of Data Reviewed/Analyzed: - Review and analysis of blood pressure trends over past 30 encounters - Independent neurological examination with detailed cerebellar testing - Assessment of multiple interactive medications including Jardiance , torsemide , antihypertensives - Review of recent nephrology recommendations - Discussion of medication adjustments and monitoring plan with patient/caregiver  3. Risk of Complications and/or Morbidity/Mortality: - High risk medical decision making due to:   * Multiple interacting comorbidities including severe CKD, CAD, morbid obesity   * Complex medication adjustments in setting of severe renal dysfunction   * Need for careful monitoring of volume status and neurological symptoms   * Risk of progression to uremic encephalopathy or hypertensive crisis   * Multiple social determinants of health affecting care  Medical necessity for today's high-level visit is supported by: 1. Acute neurological symptoms requiring urgent evaluation 2. Complex medication adjustments needed 3. Multiple chronic conditions requiring careful coordination 4. Need for extensive patient/caregiver education 5. High risk of adverse outcomes without careful monitoring  Supporting documentation present in note including detailed history, examination, assessment and plan addressing each active problem with appropriate follow-up planning. This document was synthesized by artificial intelligence (Abridge) using HIPAA-compliant recording of the clinical interaction;   We  discussed the use of AI scribe software for clinical note transcription with the patient, who gave  verbal consent to proceed.    The treatment plan was discussed and reviewed in detail, including medication safety, potential side effects, and all patient questions. We confirmed understanding and comfort with the plan. Follow-up instructions were established, including contacting the office for any concerns, returning if symptoms worsen, persist, or new symptoms develop, and precautions for potential emergency department visits.

## 2023-12-01 NOTE — Assessment & Plan Note (Signed)
 Stage 4 Chronic Kidney Disease (N18.4) Assessment: Severe CKD with GFR <20, currently managed without dialysis. Recent transition from furosemide  to torsemide . Current symptoms raise concern for possible uremic encephalopathy vs medication effects.  Plan:  Continue current medications including torsemide  and Jardiance  with close monitoring Check electrolytes, BUN/Cr, eGFR today Ensure follow-up with new nephrologist Dr. Tobie Defer fistula placement per recent nephrology recommendations Patient educated on symptoms of uremia/volume overload

## 2023-12-01 NOTE — Telephone Encounter (Signed)
  [name] with [facility] lab called with critical lab values:  Creatine- 5.78 GFR-10.66  Office close for holiday. RN called on call  Dr Worth Crazier at [phone] and left message for him to call us  back to give results.  Sending to [facility] Admin/Clinical        Copied from CRM [id number]. Topic: Clinical - Pink Word Triage >> Dec 01, 2023  2:58 PM Renea ORN wrote: [name] w/Lab @ [facility]  states that she has some critical results to give.

## 2023-12-02 LAB — TSH+FREE T4: TSH W/REFLEX TO FT4: 3.83 m[IU]/L (ref 0.40–4.50)

## 2023-12-03 ENCOUNTER — Other Ambulatory Visit: Payer: Self-pay

## 2023-12-03 ENCOUNTER — Other Ambulatory Visit (HOSPITAL_COMMUNITY): Payer: Self-pay

## 2023-12-03 ENCOUNTER — Telehealth: Payer: Self-pay | Admitting: Pharmacy Technician

## 2023-12-03 ENCOUNTER — Telehealth: Payer: Self-pay | Admitting: Internal Medicine

## 2023-12-03 ENCOUNTER — Other Ambulatory Visit: Payer: Self-pay | Admitting: Internal Medicine

## 2023-12-03 DIAGNOSIS — E7849 Other hyperlipidemia: Secondary | ICD-10-CM

## 2023-12-03 DIAGNOSIS — I10 Essential (primary) hypertension: Secondary | ICD-10-CM

## 2023-12-03 DIAGNOSIS — R251 Tremor, unspecified: Secondary | ICD-10-CM

## 2023-12-03 DIAGNOSIS — I2510 Atherosclerotic heart disease of native coronary artery without angina pectoris: Secondary | ICD-10-CM

## 2023-12-03 DIAGNOSIS — H5500 Unspecified nystagmus: Secondary | ICD-10-CM

## 2023-12-03 DIAGNOSIS — R260 Ataxic gait: Secondary | ICD-10-CM

## 2023-12-03 MED ORDER — LOSARTAN POTASSIUM 50 MG PO TABS: 50.0000 mg | ORAL_TABLET | Freq: Every day | ORAL | 3 refills | Status: DC

## 2023-12-03 NOTE — Telephone Encounter (Signed)
 Pt c/o medication issue:  1. Name of Medication:   Evolocumab  (REPATHA  SURECLICK) 140 MG/ML SOAJ    2. How are you currently taking this medication (dosage and times per day)? INJECT THE CONTENTS OF 1 SYRINGE UNDER THE SKIN EVERY 14 DAYS   3. Are you having a reaction (difficulty breathing--STAT)? No  4. What is your medication issue? Pharmacy calling to ask if this medication needs prior auth or if we were working on the order. Please advise

## 2023-12-03 NOTE — Telephone Encounter (Signed)
 Informed patient of lab results/notes. Patient informed me that his ENT doctor had already informed him of these results.

## 2023-12-03 NOTE — Telephone Encounter (Signed)
 Pharmacy Patient Advocate Encounter   Received notification from Pt Calls Messages that prior authorization for repatha  is required/requested.   Insurance verification completed.   The patient is insured through [facility] .   Per test claim: PA required; PA submitted to above mentioned insurance via CoverMyMeds Key/confirmation #/EOC [id number] Status is pending

## 2023-12-07 ENCOUNTER — Other Ambulatory Visit (HOSPITAL_COMMUNITY): Payer: Self-pay

## 2023-12-07 NOTE — Telephone Encounter (Signed)
 Per prior auth team: PA request has been Approved. New Encounter created for follow up. For additional info see Pharmacy Prior Auth telephone encounter from 12/03/23.

## 2023-12-07 NOTE — Telephone Encounter (Signed)
 Pharmacy Patient Advocate Encounter  Received notification from [facility] that Prior Authorization for repatha  has been APPROVED from 12/03/23 to 12/02/24. Ran test claim, Copay is $24.99 one month. This test claim was processed through [facility]- copay amounts may vary at other pharmacies due to pharmacy/plan contracts, or as the patient moves through the different stages of their insurance plan.   PA #/Case ID/Reference #: [id number]

## 2023-12-14 ENCOUNTER — Encounter: Payer: Self-pay | Admitting: Internal Medicine

## 2023-12-14 ENCOUNTER — Other Ambulatory Visit: Payer: Self-pay | Admitting: Internal Medicine

## 2023-12-14 ENCOUNTER — Telehealth: Payer: Self-pay

## 2023-12-14 ENCOUNTER — Ambulatory Visit (INDEPENDENT_AMBULATORY_CARE_PROVIDER_SITE_OTHER): Payer: BC Managed Care – PPO | Admitting: Internal Medicine

## 2023-12-14 VITALS — BP 168/87 | HR 72 | Temp 99.3°F | Ht 71.0 in | Wt 387.6 lb

## 2023-12-14 DIAGNOSIS — D696 Thrombocytopenia, unspecified: Secondary | ICD-10-CM

## 2023-12-14 DIAGNOSIS — I1A0 Resistant hypertension: Secondary | ICD-10-CM | POA: Diagnosis not present

## 2023-12-14 DIAGNOSIS — Z8719 Personal history of other diseases of the digestive system: Secondary | ICD-10-CM

## 2023-12-14 DIAGNOSIS — K130 Diseases of lips: Secondary | ICD-10-CM

## 2023-12-14 DIAGNOSIS — I4892 Unspecified atrial flutter: Secondary | ICD-10-CM

## 2023-12-14 DIAGNOSIS — G4733 Obstructive sleep apnea (adult) (pediatric): Secondary | ICD-10-CM

## 2023-12-14 DIAGNOSIS — R112 Nausea with vomiting, unspecified: Secondary | ICD-10-CM | POA: Diagnosis not present

## 2023-12-14 DIAGNOSIS — I77819 Aortic ectasia, unspecified site: Secondary | ICD-10-CM

## 2023-12-14 DIAGNOSIS — Z951 Presence of aortocoronary bypass graft: Secondary | ICD-10-CM

## 2023-12-14 DIAGNOSIS — D472 Monoclonal gammopathy: Secondary | ICD-10-CM

## 2023-12-14 DIAGNOSIS — M4316 Spondylolisthesis, lumbar region: Secondary | ICD-10-CM

## 2023-12-14 DIAGNOSIS — D699 Hemorrhagic condition, unspecified: Secondary | ICD-10-CM

## 2023-12-14 DIAGNOSIS — R161 Splenomegaly, not elsewhere classified: Secondary | ICD-10-CM

## 2023-12-14 DIAGNOSIS — R21 Rash and other nonspecific skin eruption: Secondary | ICD-10-CM

## 2023-12-14 DIAGNOSIS — M1A09X0 Idiopathic chronic gout, multiple sites, without tophus (tophi): Secondary | ICD-10-CM

## 2023-12-14 DIAGNOSIS — K219 Gastro-esophageal reflux disease without esophagitis: Secondary | ICD-10-CM

## 2023-12-14 DIAGNOSIS — E782 Mixed hyperlipidemia: Secondary | ICD-10-CM

## 2023-12-14 DIAGNOSIS — N184 Chronic kidney disease, stage 4 (severe): Secondary | ICD-10-CM

## 2023-12-14 DIAGNOSIS — E039 Hypothyroidism, unspecified: Secondary | ICD-10-CM

## 2023-12-14 DIAGNOSIS — I10 Essential (primary) hypertension: Secondary | ICD-10-CM

## 2023-12-14 DIAGNOSIS — R5381 Other malaise: Secondary | ICD-10-CM

## 2023-12-14 DIAGNOSIS — L219 Seborrheic dermatitis, unspecified: Secondary | ICD-10-CM

## 2023-12-14 DIAGNOSIS — Z5309 Procedure and treatment not carried out because of other contraindication: Secondary | ICD-10-CM

## 2023-12-14 DIAGNOSIS — Z79899 Other long term (current) drug therapy: Secondary | ICD-10-CM

## 2023-12-14 DIAGNOSIS — I2510 Atherosclerotic heart disease of native coronary artery without angina pectoris: Secondary | ICD-10-CM

## 2023-12-14 DIAGNOSIS — I872 Venous insufficiency (chronic) (peripheral): Secondary | ICD-10-CM

## 2023-12-14 DIAGNOSIS — R7303 Prediabetes: Secondary | ICD-10-CM

## 2023-12-14 DIAGNOSIS — R001 Bradycardia, unspecified: Secondary | ICD-10-CM

## 2023-12-14 DIAGNOSIS — K7581 Nonalcoholic steatohepatitis (NASH): Secondary | ICD-10-CM

## 2023-12-14 DIAGNOSIS — R42 Dizziness and giddiness: Secondary | ICD-10-CM

## 2023-12-14 DIAGNOSIS — K7210 Chronic hepatic failure without coma: Secondary | ICD-10-CM

## 2023-12-14 LAB — URINALYSIS, ROUTINE W REFLEX MICROSCOPIC
Bilirubin Urine: NEGATIVE
Ketones, ur: NEGATIVE
Leukocytes,Ua: NEGATIVE
Nitrite: NEGATIVE
Specific Gravity, Urine: 1.02 (ref 1.000–1.030)
Total Protein, Urine: >=300 — AB
Urine Glucose: NEGATIVE
Urobilinogen, UA: 0.2 (ref 0.0–1.0)
pH: 6.5 (ref 5.0–8.0)

## 2023-12-14 LAB — COMPREHENSIVE METABOLIC PANEL
ALT: 39 U/L (ref 0–53)
AST: 37 U/L (ref 0–37)
Albumin: 3.3 g/dL — ABNORMAL LOW (ref 3.5–5.2)
Alkaline Phosphatase: 171 U/L — ABNORMAL HIGH (ref 39–117)
BUN: 45 mg/dL — ABNORMAL HIGH (ref 6–23)
CO2: 26 meq/L (ref 19–32)
Calcium: 9.3 mg/dL (ref 8.4–10.5)
Chloride: 107 meq/L (ref 96–112)
Creatinine, Ser: 5.98 mg/dL (ref 0.40–1.50)
GFR: 10.23 mL/min — CL (ref >60.00)
Glucose, Bld: 110 mg/dL — ABNORMAL HIGH (ref 70–99)
Potassium: 3.5 meq/L (ref 3.5–5.1)
Sodium: 143 meq/L (ref 135–145)
Total Bilirubin: 0.9 mg/dL (ref 0.2–1.2)
Total Protein: 6.6 g/dL (ref 6.0–8.3)

## 2023-12-14 LAB — CBC WITH DIFFERENTIAL/PLATELET
Basophils Absolute: 0 10*3/uL (ref 0.0–0.1)
Basophils Relative: 0.8 % (ref 0.0–3.0)
Eosinophils Absolute: 0.2 10*3/uL (ref 0.0–0.7)
Eosinophils Relative: 3.9 % (ref 0.0–5.0)
HCT: 37 % — ABNORMAL LOW (ref 39.0–52.0)
Hemoglobin: 12.6 g/dL — ABNORMAL LOW (ref 13.0–17.0)
Lymphocytes Relative: 16.1 % (ref 12.0–46.0)
Lymphs Abs: 1 10*3/uL (ref 0.7–4.0)
MCHC: 34 g/dL (ref 30.0–36.0)
MCV: 100.3 fL — ABNORMAL HIGH (ref 78.0–100.0)
Monocytes Absolute: 0.8 10*3/uL (ref 0.1–1.0)
Monocytes Relative: 11.9 % (ref 3.0–12.0)
Neutro Abs: 4.2 10*3/uL (ref 1.4–7.7)
Neutrophils Relative %: 67.3 % (ref 43.0–77.0)
Platelets: 70 10*3/uL — ABNORMAL LOW (ref 150.0–400.0)
RBC: 3.69 Mil/uL — ABNORMAL LOW (ref 4.22–5.81)
RDW: 14.3 % (ref 11.5–15.5)
WBC: 6.3 10*3/uL (ref 4.0–10.5)

## 2023-12-14 MED ORDER — TIRZEPATIDE 2.5 MG/0.5ML ~~LOC~~ SOAJ: 2.5000 mg | SUBCUTANEOUS | 3 refills | Status: DC

## 2023-12-14 MED ORDER — METOPROLOL TARTRATE 25 MG PO TABS: 25.0000 mg | ORAL_TABLET | Freq: Two times a day (BID) | ORAL | 3 refills | Status: DC

## 2023-12-14 MED ORDER — KETOCONAZOLE 2 % EX CREA: 1.0000 | TOPICAL_CREAM | Freq: Two times a day (BID) | CUTANEOUS | 0 refills | Status: DC

## 2023-12-14 MED ORDER — ONDANSETRON 4 MG PO TBDP: 4.0000 mg | ORAL_TABLET | Freq: Three times a day (TID) | ORAL | 2 refills | Status: DC | PRN

## 2023-12-14 NOTE — Telephone Encounter (Signed)
 Creatinine at 5.98, GFR AT 10.23.

## 2023-12-14 NOTE — Progress Notes (Signed)
 Reviewed lab work showing stable GFR BUN anemia and low platelets despite nausea and vomiting which we attributed to Mounjaro .  Called patient and discussed this importance of going to emergency room if any worsening of condition since he is holding Mounjaro  we would expect it to improve.

## 2023-12-14 NOTE — Progress Notes (Signed)
 ==============================  [facility]: [id number]   -- Medical Office Visit --  Patient: Sean [name]      Age: 53 y.o.       Sex:  male  Date:   12/14/2023 Today's Healthcare Provider: Bernardino KANDICE Cone, MD  ==============================   CHIEF COMPLAINT: 1 month follow-up (Since Saturday, has been experiencing shakes, vomiting, slight dizziness.), Dizziness, Nausea, and Emesis    SUBJECTIVE: 53 y.o. male who has History of umbilical hernia repair; Hypertension; Hyperlipidemia; Thrombocytopenia (HCC); Sinus bradycardia; CAD (coronary artery disease); Atrial flutter with rapid ventricular response (HCC); Morbid obesity (HCC); Chronic kidney disease, stage 4 (severe) (HCC); Other long term (current) drug therapy; History of coronary artery bypass surgery; Obstructive sleep apnea syndrome; Chronic gout of multiple sites; Alkaline phosphatase elevation; Ectatic aorta (HCC); Gastroesophageal reflux disease without esophagitis; Herpes simplex; Metabolic dysfunction-associated steatohepatitis (MASH); Prediabetes; Statins contraindicated; Hypothyroid; Chronic venous stasis dermatitis of both lower extremities; Nausea and vomiting; Chronic liver failure without hepatic coma (HCC); Splenomegaly; Spondylolisthesis; Bleeding diathesis (HCC); Malaise and fatigue; Allergic rhinitis due to pollen; Amitriptyline  adverse reaction; MGUS (monoclonal gammopathy of unknown significance); 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    This is a very complex patient.  History of Present Illness The patient, with a history of kidney dysfunction and a recent initiation of Mounjaro  injections, presents with persistent nausea and vomiting since Saturday. The patient attributes these symptoms to the Mounjaro  5mg injections, which are administered weekly. The onset of symptoms  is consistently post-injection, with the patient reporting similar episodes of nausea and vomiting following each administration. The patient also reports experiencing tremors in both hands post-injection.    In addition to the nausea and vomiting, the patient reports feeling lightheaded and experiencing shortness of breath, particularly upon exertion. The patient does not believe these symptoms are new, attributing them to being out of shape.  The patient also reports a history of high blood pressure and is inquiring about the need for metoprolol , a medication he was previously prescribed but was not refilled. The patient's blood pressure was noted to be high during the consultation, which was attributed to dehydration from vomiting.  The patient also mentions a persistent facial rash, which he believes is due to the use of a CPAP machine. The patient reports not receiving a previously prescribed cream for this issue.  The patient's overall condition is complex, with multiple potential contributing factors to the current symptoms of nausea vomiting and lightheadedness. The patient's kidney function is of particular concern, with recent lab results indicating a possible decline to end stage renal disease on dialysis. The patient's hydration status is also a concern due to the ongoing vomiting.  The patient's medication regimen includes Mounjaro  injections, which are now suspected to be contributing to the patient's symptoms.  Note that patient  has a past medical history of Abnormal liver enzymes, Cardiac cirrhosis, Chronic combined systolic and diastolic CHF (congestive heart failure) (HCC), CKD (chronic kidney disease) stage 3, GFR 30-59 ml/min (HCC) (01/04/2016), CKD (chronic kidney disease), stage II, Congenital heart defect, Coronary artery disease, Gastric polyps (11/25/2022), Gastritis and gastroduodenitis (11/25/2022), Gastroesophageal reflux disease with esophagitis and hemorrhage (04/04/2021),  Hematuria, History of umbilical hernia repair (07/10/2011), Hypercholesteremia, Hypertension, Incisional hernia (07/10/2011), Morbid obesity (HCC), Morbid obesity with BMI of 50.0-59.9, adult (HCC) (01/04/2016), Nausea and vomiting (11/25/2022), OSA on CPAP, Paroxysmal atrial flutter (HCC) (02/12/2016), 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  Problem list overviews that were updated at today's visit:No problems updated.  Med reconciliation: Current Outpatient Medications on File Prior to Visit  Medication Sig   acetaminophen  (TYLENOL ) 500 MG tablet Take 1,500 mg by mouth once as needed for moderate pain (pain score 4-6), fever, headache or mild pain (pain score 1-3).   allopurinol  (ZYLOPRIM ) 100 MG tablet Take 1 tablet (100 mg total) by mouth daily.   amitriptyline  (ELAVIL ) 25 MG tablet Take 1 tablet (25 mg total) by mouth at bedtime. (Patient taking differently: Take 25 mg by mouth at bedtime.)   apixaban  (ELIQUIS ) 5 MG TABS tablet Take 1 tablet (5 mg total) by mouth 2 (two) times daily.   azelastine  (ASTELIN ) 0.1 % nasal spray Place 2 sprays into both nostrils 2 (two) times daily. Use in each nostril as directed   Cholecalciferol (VITAMIN D3) 125 MCG (5000 UT) CAPS Take 1 capsule (5,000 Units total) by mouth daily at 12 noon.   Cyanocobalamin  (VITAMIN B-12 PO) Place 1 tablet under the tongue daily.   diltiazem  (CARDIZEM  SR) 120 MG 12 hr capsule Take 1 capsule (120 mg total) by mouth 2 (two) times daily.   docusate sodium  (COLACE) 100 MG capsule Take 2 capsules (200 mg total) by mouth 2 (two) times daily.   Evolocumab  (REPATHA  SURECLICK) 140 MG/ML SOAJ INJECT THE CONTENTS OF 1 SYRINGE UNDER THE SKIN EVERY 14 DAYS   famotidine  (PEPCID ) 20 MG tablet Take 1 tablet (20 mg total) by mouth 2 (two) times daily.   famotidine -calcium  carbonate-magnesium  hydroxide (PEPCID  COMPLETE) 10-800-165 MG chewable tablet Chew 1 tablet by mouth 2 (two) times daily as needed.    fluticasone  (FLONASE ) 50 MCG/ACT nasal spray Place 2 sprays into both nostrils daily.   hydrocortisone  (ANUSOL -HC) 2.5 % rectal cream Apply rectally 2 (two) times daily.   JARDIANCE  10 MG TABS tablet TAKE 1 TABLET(10 MG) BY MOUTH DAILY BEFORE BREAKFAST (Patient taking differently: Take 10 mg by mouth daily.)   losartan  (COZAAR ) 50 MG tablet Take 1 tablet (50 mg total) by mouth daily.   montelukast  (SINGULAIR ) 10 MG tablet TAKE 1 TABLET(10 MG) BY MOUTH DAILY (Patient taking differently: Take 10 mg by mouth daily.)   nitroGLYCERIN  (NITROSTAT ) 0.4 MG SL tablet Place 1 tablet (0.4 mg total) under the tongue every 5 (five) minutes as needed for chest pain.   pantoprazole  (PROTONIX ) 40 MG tablet TAKE 1 TABLET(40 MG) BY MOUTH TWICE DAILY (Patient taking differently: Take 40 mg by mouth 2 (two) times daily.)   polyethylene glycol (MIRALAX  / GLYCOLAX ) 17 g packet Mix 17 grams (1 packet) in 8 ounces of fluid and take by mouth daily.   potassium chloride  (KLOR-CON  M) 10 MEQ tablet TAKE 1 TABLET(10 MEQ) BY MOUTH DAILY   sucralfate  (CARAFATE ) 1 g tablet TAKE 1 TABLET(1 GRAM) BY MOUTH TWICE DAILY. MAY INCREASE TO 4 TIMES DAILY FOR ANY ACUTE SYMPTOMS (Patient taking differently: Take 1 tablet by mouth at bedtime.)   torsemide  (DEMADEX ) 10 MG tablet Take 1 tablet (10 mg total) by mouth daily.   triamcinolone  (NASACORT ) 55 MCG/ACT AERO nasal inhaler Place 1 spray into the nose daily. Start with 1 spray each side twice a day for 3 days, then reduce to daily.   cefdinir  (OMNICEF ) 300 MG capsule Take 300 mg by mouth 2 (two) times daily. (Patient not taking: Reported on 12/14/2023)   Current Facility-Administered Medications on File Prior to Visit  Medication   technetium tetrofosmin  (TC-MYOVIEW ) injection 29.8 millicurie  Medications Discontinued During This Encounter  Medication Reason   tirzepatide  (MOUNJARO ) 5 MG/0.5ML Pen    tirzepatide  (MOUNJARO ) 5 MG/0.5ML Pen    tirzepatide  (MOUNJARO ) 2.5 MG/0.5ML Pen  Reorder   ketoconazole  (NIZORAL ) 2 % cream Reorder      Objective   Physical Exam     12/14/2023   11:35 AM 12/14/2023   11:31 AM 12/01/2023    3:48 PM  Vitals with BMI  Height  5' 11 5' 11  Weight  387 lbs 10 oz 391 lbs  BMI  54.08 54.56  Systolic 168 154 843  Diastolic 87 74 76  Pulse  72 68   Wt Readings from Last 10 Encounters:  12/14/23 (!) 387 lb 9.6 oz (175.8 kg)  12/01/23 (!) 391 lb (177.4 kg)  12/01/23 (!) 391 lb 6.4 oz (177.5 kg)  11/09/23 (!) 394 lb 6.4 oz (178.9 kg)  11/04/23 (!) 396 lb 13.3 oz (180 kg)  11/02/23 (!) 391 lb 12.8 oz (177.7 kg)  10/08/23 (!) 384 lb (174.2 kg)  07/27/23 (!) 382 lb 14.4 oz (173.7 kg)  07/02/23 (!) 370 lb (167.8 kg)  06/17/23 (!) 377 lb 3.3 oz (171.1 kg)   Vital signs reviewed.  Nursing notes reviewed. Weight trend reviewed. Abnormalities and Problem-Specific physical exam findings:  no emesis today. Truncal adiposity. Short of breath with exertion- scabs on chapped lips.  General Appearance:  No acute distress appreciable.   Well-groomed, healthy-appearing male.  Well proportioned with no abnormal fat distribution.  Good muscle tone. Pulmonary:  Normal work of breathing at rest, no respiratory distress apparent. SpO2: 98 %  Musculoskeletal: All extremities are intact.  Neurological:  Awake, alert, oriented, and engaged.  No obvious focal neurological deficits or cognitive impairments.  Sensorium seems unclouded.   Speech is clear and coherent with logical content. Psychiatric:  Appropriate mood, pleasant and cooperative demeanor, thoughtful and engaged during the exam    No results found for any visits on 12/14/23. Office Visit on 12/01/2023  Component Date Value   WBC 12/01/2023 5.8    RBC 12/01/2023 3.63 (L)    Hemoglobin 12/01/2023 12.6 (L)    HCT 12/01/2023 36.1 (L)    MCV 12/01/2023 99.6    MCHC 12/01/2023 34.8    RDW 12/01/2023 14.9    Platelets 12/01/2023 67.0 (L)    Neutrophils Relative % 12/01/2023 56.2     Lymphocytes Relative 12/01/2023 23.7    Monocytes Relative 12/01/2023 13.0 (H)    Eosinophils Relative 12/01/2023 6.2 (H)    Basophils Relative 12/01/2023 0.9    Neutro Abs 12/01/2023 3.2    Lymphs Abs 12/01/2023 1.4    Monocytes Absolute 12/01/2023 0.8    Eosinophils Absolute 12/01/2023 0.4    Basophils Absolute 12/01/2023 0.0    Sodium 12/01/2023 140    Potassium 12/01/2023 3.5    Chloride 12/01/2023 104    CO2 12/01/2023 25    Glucose, Bld 12/01/2023 96    BUN 12/01/2023 50 (H)    Creatinine, Ser 12/01/2023 5.78 (HH)    Total Bilirubin 12/01/2023 0.8    Alkaline Phosphatase 12/01/2023 178 (H)    AST 12/01/2023 34    ALT 12/01/2023 44    Total Protein 12/01/2023 6.7    Albumin  12/01/2023 3.5    GFR 12/01/2023 10.66 (LL)    Calcium  12/01/2023 9.3    TSH W/REFLEX TO FT4 12/01/2023 3.83    Color, Urine 12/01/2023 YELLOW    APPearance 12/01/2023 CLEAR    Specific Gravity, Urine 12/01/2023 1.015  pH 12/01/2023 6.0    Total Protein, Urine 12/01/2023 100 (A)    Urine Glucose 12/01/2023 250 (A)    Ketones, ur 12/01/2023 NEGATIVE    Bilirubin Urine 12/01/2023 NEGATIVE    Hgb urine dipstick 12/01/2023 SMALL (A)    Urobilinogen, UA 12/01/2023 0.2    Leukocytes,Ua 12/01/2023 NEGATIVE    Nitrite 12/01/2023 NEGATIVE    WBC, UA 12/01/2023 0-2/hpf    RBC / HPF 12/01/2023 0-2/hpf    Squamous Epithelial / HPF 12/01/2023 Rare(0-4/hpf)    POC Glucose 12/01/2023 116 (A)   Office Visit on 11/09/2023  Component Date Value   WBC 11/09/2023 4.8    RBC 11/09/2023 3.42 (L)    Hemoglobin 11/09/2023 11.7 (L)    HCT 11/09/2023 34.3 (L)    MCV 11/09/2023 100.1 (H)    MCHC 11/09/2023 34.2    RDW 11/09/2023 15.3    Platelets 11/09/2023 66.0 (L)    Neutrophils Relative % 11/09/2023 56.8    Lymphocytes Relative 11/09/2023 25.4    Monocytes Relative 11/09/2023 11.2    Eosinophils Relative 11/09/2023 5.5 (H)    Basophils Relative 11/09/2023 1.1    Neutro Abs 11/09/2023 2.7    Lymphs Abs  11/09/2023 1.2    Monocytes Absolute 11/09/2023 0.5    Eosinophils Absolute 11/09/2023 0.3    Basophils Absolute 11/09/2023 0.1    Sodium 11/09/2023 141    Potassium 11/09/2023 3.9    Chloride 11/09/2023 107    CO2 11/09/2023 23    Glucose, Bld 11/09/2023 159 (H)    BUN 11/09/2023 66 (H)    Creatinine, Ser 11/09/2023 5.04 (HH)    Total Bilirubin 11/09/2023 0.7    Alkaline Phosphatase 11/09/2023 199 (H)    AST 11/09/2023 35    ALT 11/09/2023 45    Total Protein 11/09/2023 6.8    Albumin  11/09/2023 3.3 (L)    GFR 11/09/2023 12.57 (LL)    Calcium  11/09/2023 8.9    Creatinine, Urine 11/09/2023 42    Protein/Creat Ratio 11/09/2023 2,762 (H)    Protein/Creatinine Ratio 11/09/2023 2.762 (H)    Total Protein, Urine 11/09/2023 116 (H)    Color, Urine 11/09/2023 YELLOW    APPearance 11/09/2023 CLEAR    Specific Gravity, Urine 11/09/2023 1.015    pH 11/09/2023 6.0    Total Protein, Urine 11/09/2023 100 (A)    Urine Glucose 11/09/2023 >=1000 (A)    Ketones, ur 11/09/2023 NEGATIVE    Bilirubin Urine 11/09/2023 NEGATIVE    Hgb urine dipstick 11/09/2023 SMALL (A)    Urobilinogen, UA 11/09/2023 0.2    Leukocytes,Ua 11/09/2023 NEGATIVE    Nitrite 11/09/2023 NEGATIVE    WBC, UA 11/09/2023 0-2/hpf    RBC / HPF 11/09/2023 0-2/hpf    Mucus, UA 11/09/2023 Presence of (A)    Squamous Epithelial / HPF 11/09/2023 Rare(0-4/hpf)    Amorphous 11/09/2023 Present (A)   Admission on 11/02/2023, Discharged on 11/04/2023  Component Date Value   Sodium 11/02/2023 140    Potassium 11/02/2023 3.9    Chloride 11/02/2023 106    CO2 11/02/2023 25    Glucose, Bld 11/02/2023 170 (H)    BUN 11/02/2023 70 (H)    Creatinine, Ser 11/02/2023 5.63 (H)    Calcium  11/02/2023 9.1    Total Protein 11/02/2023 7.1    Albumin  11/02/2023 2.8 (L)    AST 11/02/2023 57 (H)    ALT 11/02/2023 71 (H)    Alkaline Phosphatase 11/02/2023 188 (H)    Total Bilirubin 11/02/2023 0.7    GFR, Estimated  11/02/2023 12 (L)    Anion  gap 11/02/2023 9    WBC 11/02/2023 5.3    RBC 11/02/2023 3.49 (L)    Hemoglobin 11/02/2023 11.3 (L)    HCT 11/02/2023 35.8 (L)    MCV 11/02/2023 102.6 (H)    MCH 11/02/2023 32.4    MCHC 11/02/2023 31.6    RDW 11/02/2023 14.6    Platelets 11/02/2023 54 (L)    nRBC 11/02/2023 0.0    ABO/RH(D) 11/02/2023 A POS    Antibody Screen 11/02/2023 NEG    Sample Expiration 11/02/2023                     Value:11/05/2023,2359 Performed at [facility], [address], [place], [place] [address]    Fecal Occult Bld 11/02/2023 NEGATIVE    Troponin I (High Sensiti* 11/02/2023 3    B Natriuretic Peptide 11/02/2023 25.4    Hemoglobin 11/02/2023 11.6 (L)    HCT 11/02/2023 35.9 (L)    Glucose-Capillary 11/02/2023 123 (H)    WBC 11/03/2023 5.1    RBC 11/03/2023 3.37 (L)    Hemoglobin 11/03/2023 11.3 (L)    HCT 11/03/2023 35.3 (L)    MCV 11/03/2023 104.7 (H)    MCH 11/03/2023 33.5    MCHC 11/03/2023 32.0    RDW 11/03/2023 14.8    Platelets 11/03/2023 56 (L)    nRBC 11/03/2023 0.0    Sodium 11/03/2023 143    Potassium 11/03/2023 3.7    Chloride 11/03/2023 111    CO2 11/03/2023 23    Glucose, Bld 11/03/2023 140 (H)    BUN 11/03/2023 65 (H)    Creatinine, Ser 11/03/2023 5.44 (H)    Calcium  11/03/2023 8.7 (L)    Total Protein 11/03/2023 6.3 (L)    Albumin  11/03/2023 2.6 (L)    AST 11/03/2023 53 (H)    ALT 11/03/2023 71 (H)    Alkaline Phosphatase 11/03/2023 185 (H)    Total Bilirubin 11/03/2023 0.6    GFR, Estimated 11/03/2023 12 (L)    Anion gap 11/03/2023 9    Magnesium  11/03/2023 2.3    Phosphorus 11/03/2023 4.9 (H)    Glucose-Capillary 11/03/2023 111 (H)    Glucose-Capillary 11/03/2023 98    Color, Urine 11/03/2023 YELLOW    APPearance 11/03/2023 CLEAR    Specific Gravity, Urine 11/03/2023 1.011    pH 11/03/2023 6.0    Glucose, UA 11/03/2023 >=500 (A)    Hgb urine dipstick 11/03/2023 NEGATIVE    Bilirubin Urine 11/03/2023 NEGATIVE    Ketones, ur 11/03/2023 NEGATIVE     Protein, ur 11/03/2023 100 (A)    Nitrite 11/03/2023 NEGATIVE    Leukocytes,Ua 11/03/2023 NEGATIVE    RBC / HPF 11/03/2023 0-5    WBC, UA 11/03/2023 0-5    Bacteria, UA 11/03/2023 NONE SEEN    Squamous Epithelial / HPF 11/03/2023 0-5    Glucose-Capillary 11/03/2023 105 (H)    WBC 11/04/2023 4.9    RBC 11/04/2023 3.15 (L)    Hemoglobin 11/04/2023 10.2 (L)    HCT 11/04/2023 31.6 (L)    MCV 11/04/2023 100.3 (H)    MCH 11/04/2023 32.4    MCHC 11/04/2023 32.3    RDW 11/04/2023 14.8    Platelets 11/04/2023 54 (L)    nRBC 11/04/2023 0.0    Magnesium  11/04/2023 2.2    Sodium 11/04/2023 141    Potassium 11/04/2023 4.0    Chloride 11/04/2023 111    CO2 11/04/2023 22    Glucose, Bld 11/04/2023 132 (H)  BUN 11/04/2023 62 (H)    Creatinine, Ser 11/04/2023 5.25 (H)    Calcium  11/04/2023 8.3 (L)    Phosphorus 11/04/2023 4.8 (H)    Albumin  11/04/2023 2.5 (L)    GFR, Estimated 11/04/2023 13 (L)    Anion gap 11/04/2023 8    Glucose-Capillary 11/03/2023 167 (H)    Glucose-Capillary 11/04/2023 153 (H)   Scanned Document on 11/02/2023  Component Date Value   EGFR 10/23/2023 16.0   Abstract on 07/27/2023  Component Date Value   Hemoglobin 07/27/2023 11.6 (A)    HCT 07/27/2023 34 (A)    Neutrophils Absolute 07/27/2023 2.41    Platelets 07/27/2023 53 (A)    WBC 07/27/2023 4.3    RBC 07/27/2023 3.44 (A)    Glucose 07/27/2023 101    BUN 07/27/2023 45 (A)    CO2 07/27/2023 25 (A)    Creatinine 07/27/2023 3.2 (A)    Potassium 07/27/2023 4.4    Sodium 07/27/2023 140    Chloride 07/27/2023 109 (A)    Calcium  07/27/2023 9.2    Albumin  07/27/2023 3.7    Alkaline Phosphatase 07/27/2023 192 (A)    ALT 07/27/2023 50 (A)    AST 07/27/2023 48 (A)    Bilirubin, Total 07/27/2023 1.4   Appointment on 07/27/2023  Component Date Value   Kappa free light chain 07/27/2023 626.3 (H)    Lambda free light chains 07/27/2023 25.3    Kappa, lambda light chai* 07/27/2023 24.75 (H)    IgG (Immunoglobin  G), Se* 07/27/2023 673    IgA 07/27/2023 1,160 (H)    IgM (Immunoglobulin M), * 07/27/2023 90    Total Protein ELP 07/27/2023 6.0    Albumin  SerPl Elph-Mcnc 07/27/2023 3.0    Alpha 1 07/27/2023 0.2    Alpha2 Glob SerPl Elph-M* 07/27/2023 0.6    B-Globulin SerPl Elph-Mc* 07/27/2023 1.7 (H)    Gamma Glob SerPl Elph-Mc* 07/27/2023 0.5    M Protein SerPl Elph-Mcnc 07/27/2023 0.8 (H)    Globulin, Total 07/27/2023 3.0    Albumin /Glob SerPl 07/27/2023 1.1    IFE 1 07/27/2023 Comment (A)    Please Note 07/27/2023 Comment    Total Protein ELP 07/27/2023 6.4    Albumin  ELP 07/27/2023 3.2    Alpha-1-Globulin 07/27/2023 0.3    Alpha-2-Globulin 07/27/2023 0.6    Beta Globulin 07/27/2023 1.7 (H)    Gamma Globulin 07/27/2023 0.7    M-Spike, % 07/27/2023 1.0 (H)    SPE Interp. 07/27/2023 Comment    Comment 07/27/2023 Comment    Globulin, Total 07/27/2023 3.2    A/G Ratio 07/27/2023 1.0   Office Visit on 07/02/2023  Component Date Value   VITD 07/02/2023 19.91 (L)    Free T4 07/02/2023 0.80    TSH 07/02/2023 10.55 (H)   Hospital Outpatient Visit on 06/26/2023  Component Date Value   WBC 06/26/2023 5.3    RBC 06/26/2023 3.34 (L)    Hemoglobin 06/26/2023 10.9 (L)    HCT 06/26/2023 33.3 (L)    MCV 06/26/2023 99.7    MCH 06/26/2023 32.6    MCHC 06/26/2023 32.7    RDW 06/26/2023 14.1    Platelets 06/26/2023 63 (L)    nRBC 06/26/2023 0.0    Prothrombin Time 06/26/2023 14.5    INR 06/26/2023 1.1    Sodium 06/26/2023 139    Potassium 06/26/2023 3.8    Chloride 06/26/2023 109    CO2 06/26/2023 20 (L)    Glucose, Bld 06/26/2023 114 (H)    BUN 06/26/2023 44 (H)  Creatinine, Ser 06/26/2023 3.81 (H)    Calcium  06/26/2023 8.5 (L)    GFR, Estimated 06/26/2023 18 (L)    Anion gap 06/26/2023 10    SURGICAL PATHOLOGY 06/26/2023                     Value:SURGICAL PATHOLOGY CASE: [id number] PATIENT: OZELL BEALS Surgical Pathology Report     Clinical History: Medical liver  (nt)     FINAL MICROSCOPIC DIAGNOSIS:  A. LIVER, BIOPSY: -  Heavily fragmented core of liver with essentially unremarkable hepatic parenchyma and portal tracts with mild portal fibrosis, mild chronic inflammation and questionable bile ductular proliferation. Note: It is noted that the patient has a complex medical history including acute on chronic kidney injury, chronic thrombocytopenia, a plasma cell neoplasm, diabetes, coronary artery disease with heart failure and reduced ejection fraction as well as morbid obesity.  The hepatic parenchyma itself is overall unremarkable without the presence of steatosis, ballooning or apoptotic bodies.  It should be noted there is quite a bit of fragmentation which can be a sign of a greater degree of fibrosis/cirrhosis then can be identified on the needle core biopsy given a diminished number of p                         ortal triads.  The portal triads available for evaluation do show a mild degree of portal fibrosis but without definitive evidence of periportal or bridging type fibrosis.  There is the presence of questionable bile ductular reaction which again raises the possibility of fibrosis that is not as readily identifiable on the HE/trichrome stains.  There is no central vein or perisinusoidal/pericellular fibrosis.  An iron stain shows no stainable iron a PAS and reticulin stain are also reviewed in the workup of the case. In summary, there is no ongoing hepatocellular damage identified including the presence of steatosis.  There are indications of an increased level of fibrosis given the fragmentation and bile ductular proliferation; however, the overall stage is difficult to evaluate on the current biopsy but is suggested to be likely stage III of 4.  If clinically indicated a repeat biopsy could be considered.    GROSS DESCRIPTION:  A. Received in formalin labeled with                          the patients name and Liver  bx is a 1.1 x 0.8 x 0.1 cm aggregate of tan soft tissue fragments, submitted in toto in a single cassette.  (LEF 06/26/2023)   Final Diagnosis performed by Mark LeGolvan DO.   Electronically signed 06/30/2023 Technical component performed at [employer], [address], [place], [place] [address].  Professional component performed at [facility], [address], [place], [place] [address].  Immunohistochemistry Technical component (if applicable) was performed at [facility]. [address], [place], [place] [address].   IMMUNOHISTOCHEMISTRY DISCLAIMER (if applicable): Some of these immunohistochemical stains may have been developed and the performance characteristics determine by [facility]. Some may not have been cleared or approved by the U.S. Food and Drug Administration. The FDA has determined that such clearance or approval is not necessary. This test                          is used for clinical purposes. It should not be  regarded as investigational or for research. This laboratory is certified under the Clinical Laboratory Improvement Amendments of 1988 (CLIA-88) as qualified to perform high complexity clinical laboratory testing.  The controls stained appropriately.   IHC stains are performed on formalin fixed, paraffin embedded tissue using a 3,3diaminobenzidine (DAB) chromogen and Leica Bond Autostainer System. The staining intensity of the nucleus is score manually and is reported as the percentage of tumor cell nuclei demonstrating specific nuclear staining. The specimens are fixed in 10% Neutral Formalin for at least 6 hours and up to 72hrs. These tests are validated on decalcified tissue. Results should be interpreted with caution given the possibility of false negative results on decalcified specimens. Antibody Clones are as follows ER-clone 12F, PR-clone 16, Ki67- clone MM1. Some of  these immunohistochemical stains may have been dev                         eloped and the performance characteristics determined by [facility].   Admission on 06/17/2023, Discharged on 06/19/2023  Component Date Value   WBC 06/17/2023 5.0    RBC 06/17/2023 3.56 (L)    Hemoglobin 06/17/2023 12.0 (L)    HCT 06/17/2023 36.3 (L)    MCV 06/17/2023 102.0 (H)    MCH 06/17/2023 33.7    MCHC 06/17/2023 33.1    RDW 06/17/2023 14.2    Platelets 06/17/2023 63 (L)    nRBC 06/17/2023 0.0    ABO/RH(D) 06/17/2023 A POS    Antibody Screen 06/17/2023 NEG    Sample Expiration 06/17/2023                     Value:06/20/2023,2359 Performed at [facility], [address] [place], [place] [address]    Sodium 06/17/2023 142    Potassium 06/17/2023 4.1    Chloride 06/17/2023 110    CO2 06/17/2023 24    Glucose, Bld 06/17/2023 120 (H)    BUN 06/17/2023 36 (H)    Creatinine, Ser 06/17/2023 3.35 (H)    Calcium  06/17/2023 9.0    GFR, Estimated 06/17/2023 21 (L)    Anion gap 06/17/2023 8    HIV Screen 4th Generatio* 06/17/2023 Non Reactive    Hgb A1c MFr Bld 06/17/2023 5.6    Mean Plasma Glucose 06/17/2023 114    Glucose-Capillary 06/17/2023 129 (H)    WBC 06/17/2023 5.3    RBC 06/17/2023 3.43 (L)    Hemoglobin 06/17/2023 11.6 (L)    HCT 06/17/2023 34.8 (L)    MCV 06/17/2023 101.5 (H)    MCH 06/17/2023 33.8    MCHC 06/17/2023 33.3    RDW 06/17/2023 14.3    Platelets 06/17/2023 53 (L)    nRBC 06/17/2023 0.0    Prothrombin Time 06/17/2023 14.5    INR 06/17/2023 1.1    Glucose-Capillary 06/17/2023 106 (H)    WBC 06/18/2023 4.7    RBC 06/18/2023 3.53 (L)    Hemoglobin 06/18/2023 11.5 (L)    HCT 06/18/2023 35.7 (L)    MCV 06/18/2023 101.1 (H)    MCH 06/18/2023 32.6    MCHC 06/18/2023 32.2    RDW 06/18/2023 14.3    Platelets 06/18/2023 55 (L)    nRBC 06/18/2023 0.0    Neutrophils Relative % 06/18/2023 53    Neutro Abs 06/18/2023 2.5    Lymphocytes Relative 06/18/2023 25     Lymphs Abs 06/18/2023 1.2    Monocytes Relative 06/18/2023 11    Monocytes Absolute 06/18/2023 0.5  Eosinophils Relative 06/18/2023 10    Eosinophils Absolute 06/18/2023 0.4    Basophils Relative 06/18/2023 1    Basophils Absolute 06/18/2023 0.0    Immature Granulocytes 06/18/2023 0    Abs Immature Granulocytes 06/18/2023 0.02    Glucose-Capillary 06/18/2023 85    Glucose-Capillary 06/18/2023 133 (H)    Sodium 06/19/2023 139    Potassium 06/19/2023 4.2    Chloride 06/19/2023 110    CO2 06/19/2023 23    Glucose, Bld 06/19/2023 131 (H)    BUN 06/19/2023 42 (H)    Creatinine, Ser 06/19/2023 3.76 (H)    Calcium  06/19/2023 8.6 (L)    GFR, Estimated 06/19/2023 19 (L)    Anion gap 06/19/2023 6    WBC 06/19/2023 5.0    RBC 06/19/2023 3.28 (L)    Hemoglobin 06/19/2023 10.9 (L)    HCT 06/19/2023 34.1 (L)    MCV 06/19/2023 104.0 (H)    MCH 06/19/2023 33.2    MCHC 06/19/2023 32.0    RDW 06/19/2023 14.4    Platelets 06/19/2023 54 (L)    nRBC 06/19/2023 0.0    Magnesium  06/19/2023 2.1    Glucose-Capillary 06/18/2023 143 (H)    Glucose-Capillary 06/19/2023 119 (H)   Hospital Outpatient Visit on 06/18/2023  Component Date Value   SURGICAL PATHOLOGY 06/18/2023                     Value:SURGICAL PATHOLOGY CASE: [id number] PATIENT: Drayke Heinemann Surgical Pathology Report     Clinical History: CKD stage 4, proteinuria (cm)     FINAL MICROSCOPIC DIAGNOSIS:  A. KIDNEY, RIGHT LOWER POLE, NEEDLE CORE BIOPSY: - See OUTSIDE PATHOLOGY REPORT under RESULTS REVIEW or the LABS TAB under SURGICAL PATHOLOGY in [facility].   GROSS DESCRIPTION:  Received fresh are 2 cores of pink-tan soft tissue, each measuring 1.4 cm in length by 0.1 cm in diameter.  The specimen is entirely sent out for [employer] processing. (KW, 06/18/2023)   Final Diagnosis performed by Norleen Dover, MD.   Electronically signed 06/24/2023 Technical and / or Professional components performed at [facility].  [facility], [address], [place], [place] [address].  Immunohistochemistry Technical component (if applicable) was performed at [facility]. [address], [address], [place], [place] [address].   IMMUNOHISTOCHEMISTRY DISCLAIMER (if applicable): Some of th                         ese immunohistochemical stains may have been developed and the performance characteristics determine by [facility]. Some may not have been cleared or approved by the U.S. Food and Drug Administration. The FDA has determined that such clearance or approval is not necessary. This test is used for clinical purposes. It should not be regarded as investigational or for research. This laboratory is certified under the Clinical Laboratory Improvement Amendments of 1988 (CLIA-88) as qualified to perform high complexity clinical laboratory testing.  The controls stained appropriately.   IHC stains are performed on formalin fixed, paraffin embedded tissue using a 3,3diaminobenzidine (DAB) chromogen and Leica Bond Autostainer System. The staining intensity of the nucleus is score manually and is reported as the percentage of tumor cell nuclei demonstrating specific nuclear staining. The specimens are fixed in 10% Neutral Formalin for at least 6 hours and up to 72hrs.                          These tests are  validated on decalcified tissue. Results should be interpreted with caution given the possibility of false negative results on decalcified specimens. Antibody Clones are as follows ER-clone [id number], PR-clone 16, Ki67- clone MM1. Some of these immunohistochemical stains may have been developed and the performance characteristics determined by [facility] Pathology.   There may be more visits with results that are not included.  No image results found. CT SINUS WO CONTRAST Result Date: 11/24/2023 CLINICAL DATA:  Recurring sinus infection EXAM: CT MAXILLOFACIAL WITHOUT CONTRAST  TECHNIQUE: Multidetector CT images of the paranasal sinuses were obtained using the standard protocol without intravenous contrast. RADIATION DOSE REDUCTION: This exam was performed according to the departmental dose-optimization program which includes automated exposure control, adjustment of the mA and/or kV according to patient size and/or use of iterative reconstruction technique. COMPARISON:  No prior CT sinus or maxillofacial available. Correlation is made with CT head 10/01/2010 FINDINGS: Paranasal sinuses: Frontal: Normally aerated. Patent frontal sinus drainage pathways. Ethmoid: Normally aerated. Maxillary: Normally aerated. Sphenoid: Normally aerated. Patent sphenoethmoidal recesses. No evidence of osseous thickening or erosion. Right ostiomeatal unit: Patent. Left ostiomeatal unit: Patent. Nasal passages: Patent. Intact nasal septum is midline. Anatomy: No pneumatization superior to anterior ethmoid notches. Symmetric and intact olfactory grooves and fovea ethmoidalis, Keros II (4-109mm). Conchal sphenoid pneumatization pattern. No dehiscence of carotid or optic canals. No onodi cell. Other: Orbits and intracranial compartment are unremarkable. Visible mastoid air cells are normally aerated. IMPRESSION: Normally aerated paranasal sinuses. Patent sinus drainage pathways. No evidence of acute or chronic sinusitis. Electronically Signed   By: Donald Campion M.D.   On: 11/24/2023 20:40   DG Chest 2 View Result Date: 11/16/2023 CLINICAL DATA:  53 year old male with fluid overload EXAM: CHEST - 2 VIEW COMPARISON:  11/02/2023 FINDINGS: Cardiomediastinal silhouette unchanged with surgical changes of median sternotomy and CABG. No pneumothorax pleural effusion or confluent airspace disease. Similar appearance of asymmetric elevation the left hemidiaphragm. Coarse interstitial markings. Degenerative changes the spine.  No displaced fracture IMPRESSION: Negative for acute cardiopulmonary disease Electronically  Signed   By: Ami Bellman D.O.   On: 11/16/2023 10:20   CT ABDOMEN PELVIS WO CONTRAST Result Date: 11/02/2023 CLINICAL DATA:  Acute nonlocalized abdominal pain. Right red blood in stool this morning. EXAM: CT ABDOMEN AND PELVIS WITHOUT CONTRAST TECHNIQUE: Multidetector CT imaging of the abdomen and pelvis was performed following the standard protocol without IV contrast. RADIATION DOSE REDUCTION: This exam was performed according to the departmental dose-optimization program which includes automated exposure control, adjustment of the mA and/or kV according to patient size and/or use of iterative reconstruction technique. COMPARISON:  01/26/2023 FINDINGS: Lower chest: Lung bases are clear. Postoperative changes in the mediastinum. Elevation of the left hemidiaphragm is unchanged. Hepatobiliary: No focal liver abnormality is seen. Status post cholecystectomy. No biliary dilatation. Pancreas: Unremarkable. No pancreatic ductal dilatation or surrounding inflammatory changes. Spleen: Normal in size without focal abnormality. Adrenals/Urinary Tract: Adrenal glands are unremarkable. Kidneys are normal, without renal calculi, focal lesion, or hydronephrosis. Bladder is unremarkable. Stomach/Bowel: Stomach, small bowel, and colon are not abnormally distended. Scattered stool in the colon. No wall thickening or inflammatory changes. Appendix is not identified. Vascular/Lymphatic: Aortic atherosclerosis. No enlarged abdominal or pelvic lymph nodes. Reproductive: Prostate gland is atrophic or surgically absent. No pelvic mass or lymphadenopathy. Other: No free air or free fluid in the abdomen. Small periumbilical hernia containing fat. Cystic structure in the left periumbilical space measuring 4 cm diameter. This is likely a sebaceous cyst. No change since previous study.  Musculoskeletal: Degenerative changes in the spine. 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 IMPRESSION: 1. No acute process demonstrated in  the abdomen or pelvis. No significant change since previous study. 2. Chronic elevation of the left hemidiaphragm. 3. Mild aortic atherosclerosis. 4. Cystic structure adjacent to the umbilicus likely representing a sebaceous cyst. No change. Electronically Signed   By: Elsie Gravely M.D.   On: 11/02/2023 20:15   DG Chest Portable 1 View Result Date: 11/02/2023 CLINICAL DATA:  Shortness of breath. Bright red blood in stool this morning. Midsternal chest pain. Fatigue. EXAM: PORTABLE CHEST 1 VIEW COMPARISON:  01/26/2023 FINDINGS: Postoperative changes in the mediastinum with sternotomy wires present. Elevation of the left hemidiaphragm is unchanged since prior study. Lungs are clear. No pleural effusions. No pneumothorax. Mediastinal contours appear intact. IMPRESSION: Elevation of left hemidiaphragm is unchanged.  Lungs are clear. Electronically Signed   By: Elsie Gravely M.D.   On: 11/02/2023 20:10  CT SINUS WO CONTRAST Result Date: 11/24/2023 CLINICAL DATA:  Recurring sinus infection EXAM: CT MAXILLOFACIAL WITHOUT CONTRAST TECHNIQUE: Multidetector CT images of the paranasal sinuses were obtained using the standard protocol without intravenous contrast. RADIATION DOSE REDUCTION: This exam was performed according to the departmental dose-optimization program which includes automated exposure control, adjustment of the mA and/or kV according to patient size and/or use of iterative reconstruction technique. COMPARISON:  No prior CT sinus or maxillofacial available. Correlation is made with CT head 10/01/2010 FINDINGS: Paranasal sinuses: Frontal: Normally aerated. Patent frontal sinus drainage pathways. Ethmoid: Normally aerated. Maxillary: Normally aerated. Sphenoid: Normally aerated. Patent sphenoethmoidal recesses. No evidence of osseous thickening or erosion. Right ostiomeatal unit: Patent. Left ostiomeatal unit: Patent. Nasal passages: Patent. Intact nasal septum is midline. Anatomy: No pneumatization  superior to anterior ethmoid notches. Symmetric and intact olfactory grooves and fovea ethmoidalis, Keros II (4-9mm). Conchal sphenoid pneumatization pattern. No dehiscence of carotid or optic canals. No onodi cell. Other: Orbits and intracranial compartment are unremarkable. Visible mastoid air cells are normally aerated. IMPRESSION: Normally aerated paranasal sinuses. Patent sinus drainage pathways. No evidence of acute or chronic sinusitis. Electronically Signed   By: Donald Campion M.D.   On: 11/24/2023 20:40       Assessment & Plan Nausea and vomiting, unspecified vomiting type Nausea and Vomiting Nausea and vomiting have persisted since Saturday, likely due to the Mounjaro  injection, with symptoms including tremors. Differential diagnosis includes infection and elevated BUN from kidney dysfunction. Mounjaro 's potential to cause hypoglycemia may contribute to these symptoms. Hydration is crucial to prevent dehydration, and potential hospitalization is discussed if kidney function worsens. Informed consent covers risks of dehydration, IV fluids, and possible dialysis if BUN levels rise significantly. Stop Mounjaro . Order an abdominal x-ray(he felt abdomen pain not bad enough to warrant going downtown to obtain), blood work including BUN, and urine analysis. Prescribe Zofran  and Phenergan  for nausea. Advised hydration with fluids, ice chips, and popsicles, and recommend oral care post-vomiting with brushing, mouthwash, and Tums. Provided bags for emesis.  Advised to stay off work. Chronic kidney disease, stage 4 (severe) (HCC) Chronic Kidney Disease possibly worsening to stage 5 Chronic kidney disease is present with creatinine consistently above 5, most recently 5.78, and GFR at 10.6. Elevated BUN may contribute to nausea. Monitoring kidney function is essential, with potential hospitalization if it worsens. Informed consent includes risks of worsening kidney function and potential dialysis. Monitor BUN  and creatinine levels. Advise close follow-up and potential hospitalization if lab results worsen. Resistant hypertension Hydrate, resume metoprolol , follow up 1  week(s).  Uncontrolled  Elevated blood pressure is likely secondary to dehydration from vomiting. The physiological response to early dehydration can cause hypertension. Emphasized hydration to manage blood pressure. Prescribed metoprolol  immediate release, twice daily. Advise increased fluid intake to address dehydration. Chapped lips Chapped Lips Chapped lips with cracking are likely due to wind exposure and vomiting. Regular use of chapstick is important to prevent further cracking. Advise regular use of chapstick. Hypertension, unspecified type  Lightheadedness  Coronary artery disease involving native coronary artery of native heart without angina pectoris  Bleeding diathesis (HCC)  Chronic venous stasis dermatitis of both lower extremities  Chronic liver failure without hepatic coma (HCC)  Ectatic aorta (HCC)  Gastroesophageal reflux disease without esophagitis  History of coronary artery bypass surgery  History of umbilical hernia repair  Mixed hyperlipidemia  Hypothyroidism, unspecified type  Malaise and fatigue  MGUS (monoclonal gammopathy of unknown significance)  Morbid obesity (HCC)  NASH (nonalcoholic steatohepatitis)  Obstructive sleep apnea syndrome  Other long term (current) drug therapy  Splenomegaly  Sinus bradycardia  Prediabetes  Atrial flutter with rapid ventricular response (HCC)  Thrombocytopenia (HCC)  Statins contraindicated  Spondylolisthesis of lumbar region  Chronic gout of multiple sites, unspecified cause  Seborrheic dermatitis  Facial rash      Orders Placed During this Encounter:   Orders Placed This Encounter  Procedures   DG Abd 2 Views    Standing Status:   Future    Expected Date:   12/15/2023    Expiration Date:   12/13/2024    Reason for Exam (SYMPTOM   OR DIAGNOSIS REQUIRED):   abdominal pain    Preferred imaging location?:   [facility]   CBC with Differential/Platelet   Comp Met (CMET)   Urinalysis, Routine w reflex microscopic   Meds ordered this encounter  Medications   metoprolol  tartrate (LOPRESSOR ) 25 MG tablet    Sig: Take 1 tablet (25 mg total) by mouth 2 (two) times daily.    Dispense:  180 tablet    Refill:  3   tirzepatide  (MOUNJARO ) 2.5 MG/0.5ML Pen    Sig: Inject 2.5 mg into the skin once a week.    Dispense:  2 mL    Refill:  3   ketoconazole  (NIZORAL ) 2 % cream    Sig: Apply 1 Application topically 2 (two) times daily.    Dispense:  60 g    Refill:  0   ondansetron  (ZOFRAN -ODT) 4 MG disintegrating tablet    Sig: Take 1 tablet (4 mg total) by mouth every 8 (eight) hours as needed for nausea or vomiting.    Dispense:  30 tablet    Refill:  2     Although not directly addressed today the entire problem list was considered in the context of the patients symptome.   General Health Maintenance Maintaining hydration and monitoring for symptoms requiring emergency care is emphasized. Regular follow-up with specialists is necessary. Schedule follow-up appointments with a cardiologist, hematologist, and nephrologist. Advise close follow-up with primary care, potentially via video if in-person visits are difficult.  Follow-up Stay reachable for follow-up on lab results and imaging. Schedule a follow-up appointment in a week or sooner if needed.  This document was synthesized by artificial intelligence ([employer]bridge) using HIPAA-compliant recording of the clinical interaction;   We discussed the use of AI scribe software for clinical note transcription with the patient, who gave verbal consent to proceed.    Additional Info: This encounter employed state-of-the-art, real-time, collaborative documentation.  The patient actively reviewed and assisted in updating their electronic medical record on a shared screen,  ensuring transparency and facilitating joint problem-solving for the problem list, overview, and plan. This approach promotes accurate, informed care. The treatment plan was discussed and reviewed in detail, including medication safety, potential side effects, and all patient questions. We confirmed understanding and comfort with the plan. Follow-up instructions were established, including contacting the office for any concerns, returning if symptoms worsen, persist, or new symptoms develop, and precautions for potential emergency department visits.

## 2023-12-14 NOTE — Assessment & Plan Note (Signed)
 Chronic Kidney Disease possibly worsening to stage 5 Chronic kidney disease is present with creatinine consistently above 5, most recently 5.78, and GFR at 10.6. Elevated BUN may contribute to nausea. Monitoring kidney function is essential, with potential hospitalization if it worsens. Informed consent includes risks of worsening kidney function and potential dialysis. Monitor BUN and creatinine levels. Advise close follow-up and potential hospitalization if lab results worsen.

## 2023-12-14 NOTE — Patient Instructions (Signed)
 VISIT SUMMARY:  During today's visit, we discussed your ongoing symptoms of nausea and vomiting, which you believe are related to your Mounjaro  injections. We also addressed your concerns about high blood pressure, a persistent facial rash, and your chronic kidney disease. Your recent lab results indicate a decline in kidney function, and we discussed the importance of staying hydrated and monitoring your condition closely.  YOUR PLAN:  -NAUSEA AND VOMITING: Nausea and vomiting can be side effects of Mounjaro  injections. We will stop the Mounjaro  injections and have prescribed Zofran  and Phenergan  to help manage your nausea. It's important to stay hydrated by drinking fluids, eating ice chips, and using popsicles. Please take care of your mouth by brushing your teeth, using mouthwash, and taking Tums after vomiting. We will also conduct an abdominal x-ray, blood work, and a urine analysis to further investigate your symptoms.  -CHRONIC KIDNEY DISEASE: Chronic kidney disease means your kidneys are not functioning properly, which can lead to a buildup of waste in your body. Your recent lab results show that your kidney function has declined. We will monitor your kidney function closely and discuss potential hospitalization if your condition worsens. Please stay hydrated and follow up with your specialists regularly.  -HYPERTENSION: Hypertension, or high blood pressure, can be caused by dehydration from vomiting. We have prescribed metoprolol  to help manage your blood pressure. It's important to drink plenty of fluids to stay hydrated and help control your blood pressure.  -CHAPPED LIPS: Chapped lips can occur due to wind exposure and vomiting. Regular use of chapstick can help prevent further cracking. Please use chapstick regularly to keep your lips moisturized.  -GENERAL HEALTH MAINTENANCE: Maintaining good hydration and monitoring for any symptoms that may require emergency care is crucial. Regular  follow-up with your specialists, including a cardiologist, hematologist, and nephrologist, is necessary. We recommend scheduling follow-up appointments and staying in close contact with your primary care provider, possibly through video visits if in-person visits are difficult.  INSTRUCTIONS:  Please stay reachable for follow-up on your lab results and imaging. Schedule a follow-up appointment in a week or sooner if needed.It was a pleasure seeing you today! Your health and satisfaction are our top priorities.  Bernardino Cone, MD  Your Providers PCP: Cone Bernardino MATSU, MD,  [phone]) Referring Provider: Cone Bernardino MATSU, MD,  [phone]) Care Team Provider: Santo Stanly LABOR, MD,  [phone]) Care Team Provider: Alvia Olam BIRCH, RN Care Team Provider: Ezzard Valaria LABOR, MD,  [phone]) Care Team Provider: Eda Iha, MD Care Team Provider: Jerrye Katheryn BROCKS, MD,  [phone])     NEXT STEPS: [x]  Early Intervention: Schedule sooner appointment, call our on-call services, or go to emergency room if there is any significant Increase in pain or discomfort New or worsening symptoms Sudden or severe changes in your health [x]  Flexible Follow-Up: We recommend a No follow-ups on file. for optimal routine care. This allows for progress monitoring and treatment adjustments. [x]  Preventive Care: Schedule your annual preventive care visit! It's typically covered by insurance and helps identify potential health issues early. [x]  Lab & X-ray Appointments: Incomplete tests scheduled today, or call to schedule. X-rays: [facility] (M-F, 8:30am-noon or 1pm-5pm). [x]  Medical Information Release: Sign a release form at front desk to obtain relevant medical information we don't have.  MAKING THE MOST OF OUR FOCUSED 20 MINUTE APPOINTMENTS: [x]   Clearly state your top concerns at the beginning of the visit to focus our discussion [x]   If you anticipate you will need  more time, please inform the front desk during scheduling - we can book multiple appointments in the same week. [x]   If you have transportation problems- use our convenient video appointments or ask about transportation support. [x]   We can get down to business faster if you use MyChart to update information before the visit and submit non-urgent questions before your visit. Thank you for taking the time to provide details through MyChart.  Let our nurse know and she can import this information into your encounter documents.  Arrival and Wait Times: [x]   Arriving on time ensures that everyone receives prompt attention. [x]   Early morning (8a) and afternoon (1p) appointments tend to have shortest wait times. [x]   Unfortunately, we cannot delay appointments for late arrivals or hold slots during phone calls.  Getting Answers and Following Up [x]   Simple Questions & Concerns: For quick questions or basic follow-up after your visit, reach us  at [phone] or MyChart messaging. [x]   Complex Concerns: If your concern is more complex, scheduling an appointment might be best. Discuss this with the staff to find the most suitable option. [x]   Lab & Imaging Results: We'll contact you directly if results are abnormal or you don't use MyChart. Most normal results will be on MyChart within 2-3 business days, with a review message from Dr. Jesus. Haven't heard back in 2 weeks? Need results sooner? Contact us  at [phone]. [x]   Referrals: Our referral coordinator will manage specialist referrals. The specialist's office should contact you within 2 weeks to schedule an appointment. Call us  if you haven't heard from them after 2 weeks.  Staying Connected [x]   MyChart: Activate your MyChart for the fastest way to access results and message us . See the last page of this paperwork for instructions on how to activate.  Bring to Your Next Appointment [x]   Medications: Please bring all your medication  bottles to your next appointment to ensure we have an accurate record of your prescriptions. [x]   Health Diaries: If you're monitoring any health conditions at home, keeping a diary of your readings can be very helpful for discussions at your next appointment.  Billing [x]   X-ray & Lab Orders: These are billed by separate companies. Contact the invoicing company directly for questions or concerns. [x]   Visit Charges: Discuss any billing inquiries with our administrative services team.  Your Satisfaction Matters [x]   Share Your Experience: We strive for your satisfaction! If you have any complaints, or preferably compliments, please let Dr. Jesus know directly or contact our Practice Administrators, Manuelita Rubin or [employer], by asking at the front desk.   Reviewing Your Records [x]   Review this early draft of your clinical encounter notes below and the final encounter summary tomorrow on MyChart after its been completed.  All orders placed so far are visible here: Nausea and vomiting, unspecified vomiting type -     CBC with Differential/Platelet -     Comprehensive metabolic panel -     Urinalysis, Routine w reflex microscopic -     DG Abd 2 Views; Future -     Tirzepatide ; Inject 2.5 mg into the skin once a week.  Dispense: 2 mL; Refill: 3 -     Ondansetron ; Take 1 tablet (4 mg total) by mouth every 8 (eight) hours as needed for nausea or vomiting.  Dispense: 30 tablet; Refill: 2  Hypertension, unspecified type -     Metoprolol  Tartrate; Take 1 tablet (25 mg total) by mouth 2 (two) times daily.  Dispense: 180 tablet; Refill: 3 -     Tirzepatide ; Inject 2.5 mg into the skin once a week.  Dispense: 2 mL; Refill: 3  Lightheadedness -     Tirzepatide ; Inject 2.5 mg into the skin once a week.  Dispense: 2 mL; Refill: 3  Chronic kidney disease, stage 4 (severe) (HCC) -     Tirzepatide ; Inject 2.5 mg into the skin once a week.  Dispense: 2 mL; Refill: 3  Coronary artery disease  involving native coronary artery of native heart without angina pectoris -     Tirzepatide ; Inject 2.5 mg into the skin once a week.  Dispense: 2 mL; Refill: 3  Bleeding diathesis (HCC) -     Tirzepatide ; Inject 2.5 mg into the skin once a week.  Dispense: 2 mL; Refill: 3  Chronic venous stasis dermatitis of both lower extremities -     Tirzepatide ; Inject 2.5 mg into the skin once a week.  Dispense: 2 mL; Refill: 3  Chronic liver failure without hepatic coma (HCC) -     Tirzepatide ; Inject 2.5 mg into the skin once a week.  Dispense: 2 mL; Refill: 3  Ectatic aorta (HCC) -     Tirzepatide ; Inject 2.5 mg into the skin once a week.  Dispense: 2 mL; Refill: 3  Gastroesophageal reflux disease without esophagitis -     Tirzepatide ; Inject 2.5 mg into the skin once a week.  Dispense: 2 mL; Refill: 3  History of coronary artery bypass surgery -     Tirzepatide ; Inject 2.5 mg into the skin once a week.  Dispense: 2 mL; Refill: 3  History of umbilical hernia repair -     Tirzepatide ; Inject 2.5 mg into the skin once a week.  Dispense: 2 mL; Refill: 3  Resistant hypertension -     Tirzepatide ; Inject 2.5 mg into the skin once a week.  Dispense: 2 mL; Refill: 3  Mixed hyperlipidemia -     Tirzepatide ; Inject 2.5 mg into the skin once a week.  Dispense: 2 mL; Refill: 3  Hypothyroidism, unspecified type -     Tirzepatide ; Inject 2.5 mg into the skin once a week.  Dispense: 2 mL; Refill: 3  Malaise and fatigue -     Tirzepatide ; Inject 2.5 mg into the skin once a week.  Dispense: 2 mL; Refill: 3  MGUS (monoclonal gammopathy of unknown significance) -     Tirzepatide ; Inject 2.5 mg into the skin once a week.  Dispense: 2 mL; Refill: 3  Morbid obesity (HCC) -     Tirzepatide ; Inject 2.5 mg into the skin once a week.  Dispense: 2 mL; Refill: 3  NASH (nonalcoholic steatohepatitis) -     Tirzepatide ; Inject 2.5 mg into the skin once a week.  Dispense: 2 mL; Refill: 3  Obstructive sleep  apnea syndrome -     Tirzepatide ; Inject 2.5 mg into the skin once a week.  Dispense: 2 mL; Refill: 3  Other long term (current) drug therapy -     Tirzepatide ; Inject 2.5 mg into the skin once a week.  Dispense: 2 mL; Refill: 3  Splenomegaly -     Tirzepatide ; Inject 2.5 mg into the skin once a week.  Dispense: 2 mL; Refill: 3  Sinus bradycardia -     Tirzepatide ; Inject 2.5 mg into the skin once a week.  Dispense: 2 mL; Refill: 3  Prediabetes -     Tirzepatide ; Inject 2.5 mg into the skin once a week.  Dispense: 2 mL; Refill: 3  Atrial flutter with rapid ventricular response (HCC) -     Tirzepatide ; Inject 2.5 mg into the skin once a week.  Dispense: 2 mL; Refill: 3  Thrombocytopenia (HCC) -     Tirzepatide ; Inject 2.5 mg into the skin once a week.  Dispense: 2 mL; Refill: 3  Statins contraindicated -     Tirzepatide ; Inject 2.5 mg into the skin once a week.  Dispense: 2 mL; Refill: 3  Spondylolisthesis of lumbar region -     Tirzepatide ; Inject 2.5 mg into the skin once a week.  Dispense: 2 mL; Refill: 3  Chronic gout of multiple sites, unspecified cause -     Tirzepatide ; Inject 2.5 mg into the skin once a week.  Dispense: 2 mL; Refill: 3  Seborrheic dermatitis -     Tirzepatide ; Inject 2.5 mg into the skin once a week.  Dispense: 2 mL; Refill: 3 -     Ketoconazole ; Apply 1 Application topically 2 (two) times daily.  Dispense: 60 g; Refill: 0  Facial rash -     Tirzepatide ; Inject 2.5 mg into the skin once a week.  Dispense: 2 mL; Refill: 3 -     Ketoconazole ; Apply 1 Application topically 2 (two) times daily.  Dispense: 60 g; Refill: 0  Chapped lips

## 2023-12-14 NOTE — Assessment & Plan Note (Signed)
 Nausea and Vomiting Nausea and vomiting have persisted since Saturday, likely due to the Mounjaro  injection, with symptoms including tremors. Differential diagnosis includes infection and elevated BUN from kidney dysfunction. Mounjaro 's potential to cause hypoglycemia may contribute to these symptoms. Hydration is crucial to prevent dehydration, and potential hospitalization is discussed if kidney function worsens. Informed consent covers risks of dehydration, IV fluids, and possible dialysis if BUN levels rise significantly. Stop Mounjaro . Order an abdominal x-ray(he felt abdomen pain not bad enough to warrant going downtown to obtain), blood work including BUN, and urine analysis. Prescribe Zofran  and Phenergan  for nausea. Advised hydration with fluids, ice chips, and popsicles, and recommend oral care post-vomiting with brushing, mouthwash, and Tums. Provided bags for emesis.  Advised to stay off work.

## 2023-12-14 NOTE — Assessment & Plan Note (Signed)
 Hydrate, resume metoprolol , follow up 1 week(s).  Uncontrolled  Elevated blood pressure is likely secondary to dehydration from vomiting. The physiological response to early dehydration can cause hypertension. Emphasized hydration to manage blood pressure. Prescribed metoprolol  immediate release, twice daily. Advise increased fluid intake to address dehydration.

## 2023-12-16 ENCOUNTER — Other Ambulatory Visit: Payer: Self-pay

## 2023-12-16 DIAGNOSIS — D699 Hemorrhagic condition, unspecified: Secondary | ICD-10-CM

## 2023-12-16 DIAGNOSIS — M4316 Spondylolisthesis, lumbar region: Secondary | ICD-10-CM

## 2023-12-16 DIAGNOSIS — D696 Thrombocytopenia, unspecified: Secondary | ICD-10-CM

## 2023-12-16 DIAGNOSIS — K7581 Nonalcoholic steatohepatitis (NASH): Secondary | ICD-10-CM

## 2023-12-16 DIAGNOSIS — R42 Dizziness and giddiness: Secondary | ICD-10-CM

## 2023-12-16 DIAGNOSIS — R7303 Prediabetes: Secondary | ICD-10-CM

## 2023-12-16 DIAGNOSIS — M1A09X0 Idiopathic chronic gout, multiple sites, without tophus (tophi): Secondary | ICD-10-CM

## 2023-12-16 DIAGNOSIS — R161 Splenomegaly, not elsewhere classified: Secondary | ICD-10-CM

## 2023-12-16 DIAGNOSIS — I872 Venous insufficiency (chronic) (peripheral): Secondary | ICD-10-CM

## 2023-12-16 DIAGNOSIS — G4733 Obstructive sleep apnea (adult) (pediatric): Secondary | ICD-10-CM

## 2023-12-16 DIAGNOSIS — K219 Gastro-esophageal reflux disease without esophagitis: Secondary | ICD-10-CM

## 2023-12-16 DIAGNOSIS — I2510 Atherosclerotic heart disease of native coronary artery without angina pectoris: Secondary | ICD-10-CM

## 2023-12-16 DIAGNOSIS — R001 Bradycardia, unspecified: Secondary | ICD-10-CM

## 2023-12-16 DIAGNOSIS — D472 Monoclonal gammopathy: Secondary | ICD-10-CM

## 2023-12-16 DIAGNOSIS — R5381 Other malaise: Secondary | ICD-10-CM

## 2023-12-16 DIAGNOSIS — Z5309 Procedure and treatment not carried out because of other contraindication: Secondary | ICD-10-CM

## 2023-12-16 DIAGNOSIS — I1A0 Resistant hypertension: Secondary | ICD-10-CM

## 2023-12-16 DIAGNOSIS — I77819 Aortic ectasia, unspecified site: Secondary | ICD-10-CM

## 2023-12-16 DIAGNOSIS — E782 Mixed hyperlipidemia: Secondary | ICD-10-CM

## 2023-12-16 DIAGNOSIS — R112 Nausea with vomiting, unspecified: Secondary | ICD-10-CM

## 2023-12-16 DIAGNOSIS — R21 Rash and other nonspecific skin eruption: Secondary | ICD-10-CM

## 2023-12-16 DIAGNOSIS — K7210 Chronic hepatic failure without coma: Secondary | ICD-10-CM

## 2023-12-16 DIAGNOSIS — N184 Chronic kidney disease, stage 4 (severe): Secondary | ICD-10-CM

## 2023-12-16 DIAGNOSIS — Z8719 Personal history of other diseases of the digestive system: Secondary | ICD-10-CM

## 2023-12-16 DIAGNOSIS — E039 Hypothyroidism, unspecified: Secondary | ICD-10-CM

## 2023-12-16 DIAGNOSIS — I4892 Unspecified atrial flutter: Secondary | ICD-10-CM

## 2023-12-16 DIAGNOSIS — Z951 Presence of aortocoronary bypass graft: Secondary | ICD-10-CM

## 2023-12-16 DIAGNOSIS — Z79899 Other long term (current) drug therapy: Secondary | ICD-10-CM

## 2023-12-16 DIAGNOSIS — L219 Seborrheic dermatitis, unspecified: Secondary | ICD-10-CM

## 2023-12-16 DIAGNOSIS — I10 Essential (primary) hypertension: Secondary | ICD-10-CM

## 2023-12-16 MED ORDER — METOPROLOL TARTRATE 25 MG PO TABS: 25.0000 mg | ORAL_TABLET | Freq: Two times a day (BID) | ORAL | 3 refills | Status: DC

## 2023-12-16 MED ORDER — ONDANSETRON 4 MG PO TBDP: 4.0000 mg | ORAL_TABLET | Freq: Three times a day (TID) | ORAL | 2 refills | Status: AC | PRN

## 2023-12-16 NOTE — Telephone Encounter (Signed)
 Copied from CRM [id number]. Topic: Clinical - Prescription Issue >> Dec 16, 2023  2:36 PM Pattie Borders B wrote: Reason for CRM: Patient calling because saw PCP on 1/13 and all Rxs were sent to incorrect pharmacy([facility]). Pharmacy ([facility]) has been updated, needs sent to correct location in chart.  1-metoprolol  tartrate (LOPRESSOR ) 25 MG tablet [[id number]]    2-ketoconazole  (NIZORAL ) 2 % cream [[id number]]    3-ondansetron  (ZOFRAN -ODT) 4 MG disintegrating tablet [[id number]]   4-tirzepatide  (MOUNJARO ) 2.5 MG/0.5ML Pen [[id number]]   Sent the metoprolol  and the ondansetron  to the [facility] pharmacy on file. The other two medications [facility] to [facility].

## 2023-12-21 DIAGNOSIS — D693 Immune thrombocytopenic purpura: Secondary | ICD-10-CM | POA: Diagnosis not present

## 2023-12-21 DIAGNOSIS — K7402 Hepatic fibrosis, advanced fibrosis: Secondary | ICD-10-CM | POA: Diagnosis not present

## 2023-12-21 DIAGNOSIS — K7581 Nonalcoholic steatohepatitis (NASH): Secondary | ICD-10-CM | POA: Diagnosis not present

## 2023-12-22 ENCOUNTER — Other Ambulatory Visit: Payer: Self-pay | Admitting: Nurse Practitioner

## 2023-12-22 DIAGNOSIS — K7402 Hepatic fibrosis, advanced fibrosis: Secondary | ICD-10-CM

## 2023-12-22 DIAGNOSIS — K7581 Nonalcoholic steatohepatitis (NASH): Secondary | ICD-10-CM

## 2023-12-23 ENCOUNTER — Other Ambulatory Visit: Payer: Self-pay | Admitting: Internal Medicine

## 2023-12-23 DIAGNOSIS — D631 Anemia in chronic kidney disease: Secondary | ICD-10-CM | POA: Diagnosis not present

## 2023-12-23 DIAGNOSIS — N184 Chronic kidney disease, stage 4 (severe): Secondary | ICD-10-CM | POA: Diagnosis not present

## 2023-12-23 DIAGNOSIS — R1013 Epigastric pain: Secondary | ICD-10-CM

## 2023-12-23 DIAGNOSIS — N2581 Secondary hyperparathyroidism of renal origin: Secondary | ICD-10-CM | POA: Diagnosis not present

## 2023-12-23 DIAGNOSIS — I129 Hypertensive chronic kidney disease with stage 1 through stage 4 chronic kidney disease, or unspecified chronic kidney disease: Secondary | ICD-10-CM | POA: Diagnosis not present

## 2023-12-23 DIAGNOSIS — N189 Chronic kidney disease, unspecified: Secondary | ICD-10-CM | POA: Diagnosis not present

## 2023-12-24 ENCOUNTER — Ambulatory Visit
Admission: RE | Admit: 2023-12-24 | Discharge: 2023-12-24 | Disposition: A | Discharge status: Home / Self Care | Payer: BC Managed Care – PPO | Source: Ambulatory Visit | Attending: Nurse Practitioner | Admitting: Nurse Practitioner

## 2023-12-24 DIAGNOSIS — K7581 Nonalcoholic steatohepatitis (NASH): Secondary | ICD-10-CM

## 2023-12-24 DIAGNOSIS — K7402 Hepatic fibrosis, advanced fibrosis: Secondary | ICD-10-CM

## 2023-12-24 LAB — LAB REPORT - SCANNED
Calcium: 9.1
EGFR: 10
PTH: 177

## 2023-12-28 NOTE — Progress Notes (Signed)
Cardiology Office Note:  .    Date:  01/01/2024  ID:  Sean [name], DOB [birth year], MRN [id number] PCP: Sean Olszewski, MD  [facility] Providers Cardiologist:  Christell Constant, MD     CC: Follow up CAD and HF  History of Present Illness: Sean [name]    Sean [name] is a 53 y.o. male with coronary artery disease (prior CABG), ischemic cardiomyopathy, and CKD stage 5 who presents for evaluation and planning for dialysis.  He has chronic kidney disease (CKD) stage 5 with worsening kidney function, evidenced by a recent increase in creatinine from 5 to 6.  He experiences symptoms including leg pain, shortness of breath, and hand tremors, describing feeling 'yucky at times'. He was previously on [place], but these were discontinued due to dehydration and sickness. He restarted Jardiance on his own due to swelling and fluid retention, which he attributes to his kidneys rather than his liver. He has gained significant weight, from 380 to 400 pounds, suspected to be due to fluid retention.  He has a history of coronary artery disease status post coronary artery bypass grafting and ischemic cardiomyopathy with recovered ejection fraction. He also has paroxysmal atrial fibrillation and atrial flutter managed with anticoagulation and metoprolol. Currently, he is not on metoprolol due to issues with prescription refills after changing pharmacies.  He has been diagnosed with idiopathic thrombocytopenia and metabolic dysfunction associated with steatohepatitis (MASH). A recent liver biopsy was unremarkable, showing fatty deposits but no cancer. He was evaluated by a liver transplant team, but it was determined that he does not currently need a liver transplant.  He is on a medication prescribed by his gastroenterologist, which he is unable to refill due to the prescribing doctor's retirement.  Relevant histories: .  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   Studies Reviewed: .   Cardiac Studies & Procedures   CARDIAC CATHETERIZATION  CARDIAC CATHETERIZATION 11/02/2020  Narrative 1.  Severe single-vessel coronary artery disease with total occlusion of the ostial LAD 2.  Widely patent left main, left circumflex, and RCA with no significant stenoses 3.  Patent LIMA to LAD with no significant stenosis present 4.  Normal LVEDP  Recommendations: Ongoing medical therapy.  The patient has stable coronary anatomy with continued patency of the LIMA graft.  Patient may resume apixaban tomorrow morning at his normal dosing schedule.  Findings Coronary Findings Diagnostic  Dominance: Right  Left Anterior Descending Ost LAD lesion is 100% stenosed. The ostium of the LAD is flush occluded, unchanged from the previous heart catheterization. Mid LAD lesion is 30% stenosed.  Left Circumflex  First Obtuse Marginal Branch The circumflex and its branch vessels are patent with no significant stenoses.  The circumflex supplies 2 proximal obtuse marginal branches with no stenoses and a posterior lateral branch with no stenosis.  Right Coronary Artery Vessel is large. There is mild diffuse disease throughout the vessel. The right coronary artery is patent throughout.  There are diffuse irregularities.  There are no significant stenoses. Prox RCA lesion is 15% stenosed.  LIMA LIMA Graft To Mid LAD LIMA and is normal in caliber. The LIMA to LAD graft is widely patent with no significant stenosis.  There is mild stenosis in the mid LAD just beyond the LIMA insertion that appears about 30% narrowed.  Intervention  No interventions have been documented.   CARDIAC CATHETERIZATION  CARDIAC CATHETERIZATION 04/15/2017  Narrative  Ost LAD lesion, 100 %stenosed.  Prox RCA lesion, 15 %stenosed.  Mid LAD lesion, 25 %stenosed.  LIMA and is normal in caliber and anatomically normal.  The left ventricular ejection fraction is 50-55% by visual  estimate.  The left ventricular systolic function is normal.  Normal LV function with an ejection fraction of 50-55% without definitive focal segmental wall motion abnormalities.  Significant native CAD with total occlusion of the LAD at the ostium; normal codominant left circumflex vessel, and RCA with smooth 10% luminal narrowing of the proximal vessel.  Patent LIMA graft supplying the LAD.  There is a mild 25% smooth narrowing in the LAD beyond the anastomosis.  RECOMMENDATION: Medical therapy.  Findings Coronary Findings Diagnostic  Dominance: Right  Left Anterior Descending  Right Coronary Artery  LIMA LIMA Graft To Mid LAD LIMA and is normal in caliber and anatomically normal.  Intervention  No interventions have been documented.   STRESS TESTS  MYOCARDIAL PERFUSION IMAGING 04/17/2023  Narrative   Findings are consistent with no ischemia. The study is low risk.   No ST deviation was noted.   LV perfusion is normal.   Left ventricular function is normal. Nuclear stress EF: 57 %. The left ventricular ejection fraction is normal (55-65%). End diastolic cavity size is moderately enlarged. End systolic cavity size is moderately enlarged.   Prior study available for comparison from 10/04/2021. Abnormal perfusion. Apical to mid inferior fixed defect and apical to mid anterior wall defect, both thought to be artifact. LVEF 47%.  Large size, moderate intensity fixed anterior and inferior perfusion defects without reversible ischemia, consistent with artifact. LVEF 57%, moderately dilated LV with normal wall motion. This is a low risk study. Compared to a prior study in 2022, the perfusion defects are unchanged and favored to be artifact. The LVEF has improved.  ECHOCARDIOGRAM  ECHOCARDIOGRAM COMPLETE 04/16/2023  Narrative ECHOCARDIOGRAM REPORT    Patient Name:   Se[name] Date of Exam: 04/16/2023 Medical Rec #:  [id number]           Height:       71.0 in Accession  #:    [id number]          Weight:       360.0 lb Date of Birth:  [birth year]           BSA:          2.707 m Patient Age:    52 years            BP:           150/72 mmHg Patient Gender: M                   HR:           61 bpm. Exam Location:  [address]  Procedure: 2D Echo, Cardiac Doppler, Color Doppler and Intracardiac Opacification Agent  Indications:    I25.10 Coronary artery disease  History:        Patient has prior history of Echocardiogram examinations, most recent 03/31/2016. CAD, Prior CABG; Risk Factors:Hypertension. Obstructive sleep apnea-CPAP Chronic combined systolic and diastolic heart failure. Chronic kidney disease.  Sonographer:    Daphine Deutscher RDCS Referring Phys: [id number] [facility] Kyrin Garn   Sonographer Comments: Technically difficult study due to poor echo windows. IMPRESSIONS   1. Left ventricular ejection fraction, by estimation, is 50 to 55%. The left ventricle has low normal function. The left ventricle has no regional wall motion abnormalities. Left ventricular diastolic parameters were normal. 2. Right ventricular  systolic function is normal. The right ventricular size is mildly enlarged. 3. Left atrial size was mildly dilated. 4. The mitral valve is normal in structure. No evidence of mitral valve regurgitation. No evidence of mitral stenosis. 5. The aortic valve is tricuspid. Aortic valve regurgitation is not visualized. No aortic stenosis is present. 6. Aortic dilatation noted. There is mild dilatation of the ascending aorta, measuring 42 mm. 7. The inferior vena cava is normal in size with greater than 50% respiratory variability, suggesting right atrial pressure of 3 mmHg.  FINDINGS Left Ventricle: Left ventricular ejection fraction, by estimation, is 50 to 55%. The left ventricle has low normal function. The left ventricle has no regional wall motion abnormalities. Definity contrast agent was given IV to delineate the  left ventricular endocardial borders. The left ventricular internal cavity size was normal in size. There is no left ventricular hypertrophy. Left ventricular diastolic parameters were normal.  Right Ventricle: The right ventricular size is mildly enlarged. No increase in right ventricular wall thickness. Right ventricular systolic function is normal.  Left Atrium: Left atrial size was mildly dilated.  Right Atrium: Right atrial size was normal in size.  Pericardium: There is no evidence of pericardial effusion.  Mitral Valve: The mitral valve is normal in structure. No evidence of mitral valve regurgitation. No evidence of mitral valve stenosis.  Tricuspid Valve: The tricuspid valve is normal in structure. Tricuspid valve regurgitation is not demonstrated. No evidence of tricuspid stenosis.  Aortic Valve: The aortic valve is tricuspid. Aortic valve regurgitation is not visualized. No aortic stenosis is present.  Pulmonic Valve: The pulmonic valve was normal in structure. Pulmonic valve regurgitation is not visualized. No evidence of pulmonic stenosis.  Aorta: The aortic root is normal in size and structure and aortic dilatation noted. There is mild dilatation of the ascending aorta, measuring 42 mm.  Venous: The inferior vena cava is normal in size with greater than 50% respiratory variability, suggesting right atrial pressure of 3 mmHg.  IAS/Shunts: No atrial level shunt detected by color flow Doppler.   LEFT VENTRICLE PLAX 2D LVIDd:         4.50 cm   Diastology LVIDs:         3.20 cm   LV e' medial:    7.47 cm/s LV PW:         1.00 cm   LV E/e' medial:  10.8 LV IVS:        1.00 cm   LV e' lateral:   9.19 cm/s LVOT diam:     2.50 cm   LV E/e' lateral: 8.7 LV SV:         77 LV SV Index:   28 LVOT Area:     4.91 cm   RIGHT VENTRICLE RV Basal diam:  5.00 cm RV S prime:     12.70 cm/s TAPSE (M-mode): 3.6 cm  LEFT ATRIUM             Index        RIGHT ATRIUM            Index LA diam:        4.40 cm 1.63 cm/m   RA Area:     18.90 cm LA Vol (A2C):   54.8 ml 20.24 ml/m  RA Volume:   61.70 ml  22.79 ml/m LA Vol (A4C):   92.6 ml 34.21 ml/m LA Biplane Vol: 77.8 ml 28.74 ml/m AORTIC VALVE LVOT Vmax:   77.40 cm/s LVOT Vmean:  49.550  cm/s LVOT VTI:    0.156 m  AORTA Ao Root diam: 3.70 cm Ao Asc diam:  4.20 cm  MITRAL VALVE MV Area (PHT): 3.88 cm    SHUNTS MV Decel Time: 196 msec    Systemic VTI:  0.16 m MV E velocity: 80.40 cm/s  Systemic Diam: 2.50 cm MV A velocity: 57.60 cm/s MV E/A ratio:  1.40  Donato Schultz MD Electronically signed by Donato Schultz MD Signature Date/Time: 04/16/2023/12:31:40 PM    Final  TEE  ECHO TEE 02/14/2016  Narrative *[place]* *[facility]* [address] [place], [place] [address] [phone]  ------------------------------------------------------------------- Transesophageal Echocardiography with Cardioversion  Patient:    Sean, [name] MR #:       [id number] Study Date: 02/14/2016 Gender:     M Age:        45 Height:     180.3 cm Weight:     149.1 kg BSA:        2.81 m^2 Pt. Status: Room:       3W21C  Anell Barr, Joline Salt PERFORMING   Zoila Shutter MD ADMITTING    Tobias Alexander, M.D. ATTENDING    Tobias Alexander, M.D. SONOGRAPHER  Sheralyn Boatman  cc:  ------------------------------------------------------------------- LV EF: 35% -   40%  ------------------------------------------------------------------- History:   PMH:  OSA.  Atrial fibrillation.  Atrial flutter. Coronary artery disease.  Risk factors:  Hypertension. Dyslipidemia.  ------------------------------------------------------------------- Study Conclusions  - Left ventricle: There was mild concentric hypertrophy. Systolic function was moderately reduced. The estimated ejection fraction was in the range of 35% to 40%. Diffuse hypokinesis. No evidence of thrombus. - Mitral valve: There was mild  regurgitation. - Left atrium: The atrium was dilated. No evidence of thrombus in the atrial cavity or appendage. No evidence of thrombus in the atrial cavity or appendage. - Pulmonary veins: No anomaly. - Right atrium: No evidence of thrombus in the atrial cavity or appendage. - Atrial septum: Anuerysmal. No defect or patent foramen ovale was identified. - Pulmonic valve: No evidence of vegetation.  Impressions:  - Successful cardioversion. No cardiac source of emboli was indentified.  ------------------------------------------------------------------- Labs, prior tests, procedures, and surgery: Coronary artery bypass grafting.  Transesophageal echocardiography with cardioversion.  2D and intravenous contrast injection.  Birthdate:  Patient birthdate: 08/26/1971.  Age:  Patient is 53 yr old.  Sex:  Gender: male. BMI: 45.8 kg/m^2.  Blood pressure:     100/64  Patient status: Inpatient.  Study date:  Study date: 02/14/2016. Study time: 11:52 AM.  Location:  Endoscopy.  -------------------------------------------------------------------  ------------------------------------------------------------------- Left ventricle:  There was mild concentric hypertrophy. Systolic function was moderately reduced. The estimated ejection fraction was in the range of 35% to 40%. Diffuse hypokinesis.  No evidence of thrombus.  ------------------------------------------------------------------- Aortic valve:   Trileaflet.  ------------------------------------------------------------------- Aorta:  The aorta was normal, not dilated, and non-diseased.  ------------------------------------------------------------------- Mitral valve:   Doppler:  There was mild regurgitation.  ------------------------------------------------------------------- Left atrium:  The atrium was dilated.  No evidence of thrombus in the atrial cavity or appendage.  No evidence of thrombus in the atrial cavity or  appendage.  ------------------------------------------------------------------- Atrial septum:  Anuerysmal. No defect or patent foramen ovale was identified.  ------------------------------------------------------------------- Pulmonary veins:  No anomaly.  ------------------------------------------------------------------- Right ventricle:  The cavity size was normal. Wall thickness was normal. Systolic function was normal.  ------------------------------------------------------------------- Pulmonic valve:    Structurally normal valve.   Cusp separation was normal.  No evidence of vegetation.  Doppler:  There was trivial regurgitation.  ------------------------------------------------------------------- Pulmonary artery:   The main pulmonary artery was normal-sized.  ------------------------------------------------------------------- Right atrium:  The atrium was normal in size.  No evidence of thrombus in the atrial cavity or appendage.  ------------------------------------------------------------------- Pericardium:  There was no pericardial effusion.  ------------------------------------------------------------------- Post procedure conclusions Ascending Aorta:  - The aorta was normal, not dilated, and non-diseased.  ------------------------------------------------------------------- Prepared and Electronically Authenticated by  Zoila Shutter MD 2017-03-16T16:51:54  MONITORS  LONG TERM MONITOR (3-14 DAYS) 12/31/2021  Narrative  Patient had a minimum heart rate of 36 bpm, maximum heart rate of 115 bpm, and average heart rate of 61 bpm.  Predominant underlying rhythm was sinus rhythm.  Isolated PACs were rare (<1.0%).  Isolated PVCs were rare (<1.0%).  Four nocturnal sinus pauses occurred, the longest lasting 3.3 seconds.  Triggered and diary events associated with sinus bradycardia.  No malignant arrhythmias.  CT SCANS  CT CORONARY MORPH W/CTA COR  W/SCORE 12/25/2015  Addendum 12/25/2015  3:48 PM ADDENDUM REPORT: 12/25/2015 15:46 CLINICAL DATA:  53 year old male with severe proximal LAD lesion scheduled for a CABG and suspicion for dextrocardia on cardiac catheterization. EXAM: Cardiac/Coronary  CT TECHNIQUE: The patient was scanned on a Philips 256 scanner. FINDINGS: A 120 kV prospective scan was triggered in the descending thoracic aorta at 111 HU's. Axial non-contrast 3mm slices were carried out through the heart. The data set was analyzed on a dedicated work station and scored using the Agatson method. Gantry rotation speed was 270 msecs and collimation was .9 mm. No beta blockade or NTG was given. The 3D data set was reconstructed in 5% intervals of the 67-82 % of the R-R cycle. Diastolic phases were analyzed on a dedicated work station using MPR, MIP and VRT modes. The patient received 80 cc of contrast. Aorta: Normal caliber, no calcifications or dissection. Normal origin of the brachiocephalic arteries. Aortic Valve:  Trileaflet, no calcifications. Coronary Arteries: Originating in a normal position. Right dominance. Left main is short with no obvious plaque. Ostial to proximal LAD has a severe mixed plaque associated with > 90% stenosis. The plaque has almost circumferential calcifications but also has non-calcified portion. There is another mild calcified plaque in teh mid LAD associated with 25-50% stenosis. LCX is nondominant vessel. There is mild calcified plaque in OM1 associated with 25-50% stenosis. RCA is a dominant vessel with minimal calcifications in the proximal and mid portion. Other findings: The heart is rotated anteriorly with apex-base axis directed antero-posteriorly, no obvious dextrocardia present. No other anomalies such as situs inversus were seen. Mild concentric hypertrophy. Dilated pulmonary artery measuring 30 x 36 mm suggestive of pulmonary hypertension. Normal size of the right sided  chambers. Normal pulmonary veins draining into the left atrium. No ASD or VSD.  There is a possible small PFO. IMPRESSION: 1. Coronary calcium score of 327. This was 72 percentile for age and sex matched control. 2. Severe obstructive CAD in the ostial to proximal LAD with mixed plaque associated with > 90% stenosis. Only mild non-obstructive plaque in LCX and RCA. 3. Anterior rotation of the cardiac apex, no obvious dextrocardia or other anomalies such as situs inversus. 4. Mild concentric LVH. 5. Dilated pulmonary artery suggestive of pulmonary hypertension. 6. Possible PFO. Tobias Alexander Electronically Signed By: Tobias Alexander On: 12/25/2015 15:46  Narrative EXAM: OVER-READ INTERPRETATION  CT CHEST  The following report is an over-read performed by radiologist Dr. Royal Piedra [facility] on 12/25/2015. This over-read does not include  interpretation of cardiac or coronary anatomy or pathology. The coronary calcium score/coronary CTA interpretation by the cardiologist is attached.  COMPARISON:  No priors.  FINDINGS: Within the visualized portions of the thorax there are no suspicious appearing pulmonary nodules or masses, there is no acute consolidative airspace disease, no pleural effusions and no pneumothorax. Linear areas of scarring are noted in the lung bases bilaterally, particularly in the left lower lobe. No lymphadenopathy in the visualize mediastinal or hilar nodal stations. Visualized portions of the upper abdomen are remarkable for postoperative changes of prior cholecystectomy. There are no aggressive appearing lytic or blastic lesions noted in the visualized portions of the skeleton.  IMPRESSION: 1. No acute incidental noncardiac findings noted.  Electronically Signed: By: Trudie Reed M.D. On: 12/25/2015 15:06           Physical Exam:    VS:  BP (!) 148/82 (BP Location: Right Arm)   Pulse 70   Ht 5\' 11"  (1.803 m)    Wt (!) 400 lb 9.6 oz (181.7 kg)   SpO2 98%   BMI 55.87 kg/m    Wt Readings from Last 3 Encounters:  01/01/24 (!) 400 lb 9.6 oz (181.7 kg)  12/14/23 (!) 387 lb 9.6 oz (175.8 kg)  12/01/23 (!) 391 lb (177.4 kg)    Gen: no distress, Morbid obesity   Neck: No JVD Cardiac: No Rubs or Gallops, no Murmur, RRR +2 radial pulses Respiratory: Clear to auscultation bilaterally, normal effort, normal  respiratory rate GI: Soft, nontender, non-distended  MS: +2 bilateral edema;  moves all extremities Integument: Skin feels warm Neuro:  At time of evaluation, alert and oriented to person/place/time/situation; no asterixis but has resting tremor (hands) Psych: Normal affect, patient feels ok   ASSESSMENT AND PLAN: .    Chronic Kidney Disease Stage 5 CKD stage 5 with creatinine increased from 5 to 6. Symptoms include leg pain, dyspnea, and fluid retention. Discussed the necessity of starting dialysis to prevent further kidney damage and potential complications. Risks of dialysis include hypotension, especially post-dialysis. Benefits include improved fluid management and symptom relief. Patient is considering a kidney transplant. - Initiate dialysis as per Dr. Allena Katz - Monitor blood pressure closely post-dialysis - I suspect he will need to stop ARB and SGLT2i (can keep torsemide) once starting HD  Hypertension Elevated blood pressure noted. Currently on metoprolol. Discussed potential need to adjust antihypertensive medications post-dialysis due to risk of hypotension. Fluid retention and swelling may be contributing to elevated blood pressure. - Refill metoprolol - Monitor blood pressure closely post-dialysis - Adjust antihypertensive medications as needed post-dialysis: after stopping the above, stop diltiazem if persistent hypotension on HD  Ischemic Cardiomyopathy Atrial fibrillation (Paroxsymal) with CHADSVASC 3+ Ischemic cardiomyopathy with recovered ejection fraction. On anticoagulation for  paroxysmal atrial fibrillation and atrial flutter. No new symptoms reported. Importance of continuing current anticoagulation therapy to prevent thromboembolic events discussed.  Metabolic Dysfunction-Associated Steatohepatitis (MASH) Diagnosed with MASH. Liver biopsy unremarkable. Patient may restart  GLP-1 receptor agonist therapy as part of the Essence trial and to optimize candidacy for kidney transplant. Discussed potential benefits of GLP-1 therapy. - send one time refill for carafate; send copy of our note to [facility] so they may continue his care  Obstructive Sleep Apnea Obstructive sleep apnea. No new symptoms reported. Continue current management.  Hyperlipidemia Hyperlipidemia.  Continue current lipid-lowering therapy to manage cholesterol levels. - Continue current lipid-lowering therapy  Six months with me, Lorin Picket, or Beatrix Fetters, MD FASE [facility] Cardiologist  [facility]  [address] [place], [place] [address] [phone]  5:10 PM

## 2024-01-01 ENCOUNTER — Ambulatory Visit: Payer: BC Managed Care – PPO | Attending: Internal Medicine | Admitting: Internal Medicine

## 2024-01-01 VITALS — BP 148/82 | HR 70 | Ht 71.0 in | Wt >= 6400 oz

## 2024-01-01 DIAGNOSIS — I2510 Atherosclerotic heart disease of native coronary artery without angina pectoris: Secondary | ICD-10-CM

## 2024-01-01 DIAGNOSIS — J301 Allergic rhinitis due to pollen: Secondary | ICD-10-CM | POA: Diagnosis not present

## 2024-01-01 DIAGNOSIS — I4892 Unspecified atrial flutter: Secondary | ICD-10-CM | POA: Diagnosis not present

## 2024-01-01 DIAGNOSIS — N19 Unspecified kidney failure: Secondary | ICD-10-CM | POA: Diagnosis not present

## 2024-01-01 DIAGNOSIS — G4733 Obstructive sleep apnea (adult) (pediatric): Secondary | ICD-10-CM | POA: Diagnosis not present

## 2024-01-01 DIAGNOSIS — R1013 Epigastric pain: Secondary | ICD-10-CM

## 2024-01-01 DIAGNOSIS — I10 Essential (primary) hypertension: Secondary | ICD-10-CM

## 2024-01-01 MED ORDER — SUCRALFATE 1 G PO TABS: 1.0000 g | ORAL_TABLET | Freq: Every day | ORAL | 1 refills | Status: DC

## 2024-01-01 MED ORDER — METOPROLOL TARTRATE 25 MG PO TABS: 25.0000 mg | ORAL_TABLET | Freq: Two times a day (BID) | ORAL | 3 refills | Status: DC

## 2024-01-01 NOTE — Patient Instructions (Signed)
Medication Instructions:  Your physician recommends that you continue on your current medications as directed. Please refer to the Current Medication list given to you today. REFILLED: metoprolol and carafate   *If you need a refill on your cardiac medications before your next appointment, please call your pharmacy*   Lab Work: NONE  If you have labs (blood work) drawn today and your tests are completely normal, you will receive your results only by: MyChart Message (if you have MyChart) OR A paper copy in the mail If you have any lab test that is abnormal or we need to change your treatment, we will call you to review the results.   Testing/Procedures: NONE   Follow-Up: At [facility], you and your health needs are our priority.  As part of our continuing mission to provide you with exceptional heart care, we have created designated Provider Care Teams.  These Care Teams include your primary Cardiologist (physician) and Advanced Practice Providers (APPs -  Physician Assistants and Nurse Practitioners) who all work together to provide you with the care you need, when you need it.   Your next appointment:   6 month(s)  Provider:   Christell Constant, MD

## 2024-01-05 ENCOUNTER — Encounter (HOSPITAL_COMMUNITY): Payer: Self-pay

## 2024-01-05 ENCOUNTER — Emergency Department (HOSPITAL_COMMUNITY): Payer: BC Managed Care – PPO

## 2024-01-05 ENCOUNTER — Other Ambulatory Visit: Payer: Self-pay

## 2024-01-05 ENCOUNTER — Emergency Department (HOSPITAL_COMMUNITY)
Admission: EM | Admit: 2024-01-05 | Discharge: 2024-01-05 | Discharge status: Against Medical Advice | Payer: BC Managed Care – PPO | Attending: Emergency Medicine | Admitting: Emergency Medicine

## 2024-01-05 DIAGNOSIS — R1110 Vomiting, unspecified: Secondary | ICD-10-CM | POA: Insufficient documentation

## 2024-01-05 DIAGNOSIS — Z20822 Contact with and (suspected) exposure to covid-19: Secondary | ICD-10-CM | POA: Insufficient documentation

## 2024-01-05 DIAGNOSIS — N189 Chronic kidney disease, unspecified: Secondary | ICD-10-CM | POA: Insufficient documentation

## 2024-01-05 DIAGNOSIS — Z5321 Procedure and treatment not carried out due to patient leaving prior to being seen by health care provider: Secondary | ICD-10-CM | POA: Diagnosis not present

## 2024-01-05 DIAGNOSIS — R112 Nausea with vomiting, unspecified: Secondary | ICD-10-CM | POA: Diagnosis not present

## 2024-01-05 DIAGNOSIS — R9389 Abnormal findings on diagnostic imaging of other specified body structures: Secondary | ICD-10-CM | POA: Diagnosis not present

## 2024-01-05 DIAGNOSIS — R918 Other nonspecific abnormal finding of lung field: Secondary | ICD-10-CM | POA: Diagnosis not present

## 2024-01-05 DIAGNOSIS — R0602 Shortness of breath: Secondary | ICD-10-CM | POA: Diagnosis not present

## 2024-01-05 LAB — COMPREHENSIVE METABOLIC PANEL
ALT: 26 U/L (ref 0–44)
AST: 27 U/L (ref 15–41)
Albumin: 2.4 g/dL — ABNORMAL LOW (ref 3.5–5.0)
Alkaline Phosphatase: 140 U/L — ABNORMAL HIGH (ref 38–126)
Anion gap: 11 (ref 5–15)
BUN: 57 mg/dL — ABNORMAL HIGH (ref 6–20)
CO2: 24 mmol/L (ref 22–32)
Calcium: 8.9 mg/dL (ref 8.9–10.3)
Chloride: 107 mmol/L (ref 98–111)
Creatinine, Ser: 6.95 mg/dL — ABNORMAL HIGH (ref 0.61–1.24)
GFR, Estimated: 9 mL/min — ABNORMAL LOW (ref >60)
Glucose, Bld: 151 mg/dL — ABNORMAL HIGH (ref 70–99)
Potassium: 3.9 mmol/L (ref 3.5–5.1)
Sodium: 142 mmol/L (ref 135–145)
Total Bilirubin: 0.7 mg/dL (ref 0.0–1.2)
Total Protein: 5.9 g/dL — ABNORMAL LOW (ref 6.5–8.1)

## 2024-01-05 LAB — CBC WITH DIFFERENTIAL/PLATELET
Abs Immature Granulocytes: 0.05 10*3/uL (ref 0.00–0.07)
Basophils Absolute: 0.1 10*3/uL (ref 0.0–0.1)
Basophils Relative: 1 %
Eosinophils Absolute: 0.6 10*3/uL — ABNORMAL HIGH (ref 0.0–0.5)
Eosinophils Relative: 10 %
HCT: 32.8 % — ABNORMAL LOW (ref 39.0–52.0)
Hemoglobin: 10.8 g/dL — ABNORMAL LOW (ref 13.0–17.0)
Immature Granulocytes: 1 %
Lymphocytes Relative: 22 %
Lymphs Abs: 1.4 10*3/uL (ref 0.7–4.0)
MCH: 34.1 pg — ABNORMAL HIGH (ref 26.0–34.0)
MCHC: 32.9 g/dL (ref 30.0–36.0)
MCV: 103.5 fL — ABNORMAL HIGH (ref 80.0–100.0)
Monocytes Absolute: 0.8 10*3/uL (ref 0.1–1.0)
Monocytes Relative: 12 %
Neutro Abs: 3.6 10*3/uL (ref 1.7–7.7)
Neutrophils Relative %: 54 %
Platelets: 66 10*3/uL — ABNORMAL LOW (ref 150–400)
RBC: 3.17 MIL/uL — ABNORMAL LOW (ref 4.22–5.81)
RDW: 14.2 % (ref 11.5–15.5)
WBC: 6.5 10*3/uL (ref 4.0–10.5)
nRBC: 0 % (ref 0.0–0.2)

## 2024-01-05 LAB — RESP PANEL BY RT-PCR (RSV, FLU A&B, COVID)  RVPGX2
Influenza A by PCR: NEGATIVE
Influenza B by PCR: NEGATIVE
Resp Syncytial Virus by PCR: NEGATIVE
SARS Coronavirus 2 by RT PCR: NEGATIVE

## 2024-01-05 LAB — BRAIN NATRIURETIC PEPTIDE: B Natriuretic Peptide: 430.6 pg/mL — ABNORMAL HIGH (ref 0.0–100.0)

## 2024-01-05 LAB — MAGNESIUM: Magnesium: 2.4 mg/dL (ref 1.7–2.4)

## 2024-01-05 NOTE — ED Notes (Signed)
Pt stated that he was leaving. Pt seen leaving the ED with family.

## 2024-01-05 NOTE — ED Provider Triage Note (Signed)
 Emergency Medicine Provider Triage Evaluation Note  Sean [name] , a 53 y.o. male  was evaluated in triage.  Pt complains of a week of feeling sick with some episodes of vomiting.  Also notes he has been retaining more fluid in his legs.  Reports he has a history of CKD and is currently being taken care of by Dr. Tobie, who recommended he come to the ER for further evaluation of his symptoms.  He has not started dialysis yet.  Review of Systems  Positive: As above Negative: As above  Physical Exam  BP (!) 143/68   Pulse (!) 57   Temp 97.9 F (36.6 C) (Oral)   Resp 17   Ht 5' 11 (1.803 m)   Wt (!) 181.4 kg   SpO2 100%   BMI 55.79 kg/m  Gen:   Awake, no distress   Resp:  Normal effort  MSK:   Moves extremities without difficulty    Medical Decision Making  Medically screening exam initiated at 12:29 PM.  Appropriate orders placed.  Sean [name] was informed that the remainder of the evaluation will be completed by another provider, this initial triage assessment does not replace that evaluation, and the importance of remaining in the ED until their evaluation is complete.     Veta Palma, PA-C 01/05/24 1230

## 2024-01-05 NOTE — ED Triage Notes (Signed)
Pt came to ED for n/v and retaining fluid. Hx of stage 4 CKD. C/O abd pain and bilateral leg swelling. Pt takes a fluid pill and states he has not been urinating as usual.

## 2024-01-08 ENCOUNTER — Other Ambulatory Visit: Payer: Self-pay

## 2024-01-08 ENCOUNTER — Emergency Department (HOSPITAL_BASED_OUTPATIENT_CLINIC_OR_DEPARTMENT_OTHER): Payer: BC Managed Care – PPO

## 2024-01-08 ENCOUNTER — Inpatient Hospital Stay (HOSPITAL_BASED_OUTPATIENT_CLINIC_OR_DEPARTMENT_OTHER)
Admission: EM | Admit: 2024-01-08 | Discharge: 2024-01-16 | DRG: 264 | Disposition: A | Discharge status: Home / Self Care | Payer: BC Managed Care – PPO | Attending: Internal Medicine | Admitting: Internal Medicine

## 2024-01-08 DIAGNOSIS — I471 Supraventricular tachycardia, unspecified: Secondary | ICD-10-CM | POA: Diagnosis not present

## 2024-01-08 DIAGNOSIS — R112 Nausea with vomiting, unspecified: Secondary | ICD-10-CM | POA: Diagnosis not present

## 2024-01-08 DIAGNOSIS — Z880 Allergy status to penicillin: Secondary | ICD-10-CM

## 2024-01-08 DIAGNOSIS — Z6841 Body Mass Index (BMI) 40.0 and over, adult: Secondary | ICD-10-CM

## 2024-01-08 DIAGNOSIS — I132 Hypertensive heart and chronic kidney disease with heart failure and with stage 5 chronic kidney disease, or end stage renal disease: Secondary | ICD-10-CM | POA: Diagnosis not present

## 2024-01-08 DIAGNOSIS — Z79899 Other long term (current) drug therapy: Secondary | ICD-10-CM | POA: Diagnosis not present

## 2024-01-08 DIAGNOSIS — M1A9XX0 Chronic gout, unspecified, without tophus (tophi): Secondary | ICD-10-CM | POA: Diagnosis present

## 2024-01-08 DIAGNOSIS — J45909 Unspecified asthma, uncomplicated: Secondary | ICD-10-CM | POA: Diagnosis present

## 2024-01-08 DIAGNOSIS — N179 Acute kidney failure, unspecified: Secondary | ICD-10-CM | POA: Diagnosis not present

## 2024-01-08 DIAGNOSIS — I2510 Atherosclerotic heart disease of native coronary artery without angina pectoris: Secondary | ICD-10-CM | POA: Diagnosis present

## 2024-01-08 DIAGNOSIS — I4719 Other supraventricular tachycardia: Secondary | ICD-10-CM | POA: Diagnosis present

## 2024-01-08 DIAGNOSIS — N189 Chronic kidney disease, unspecified: Secondary | ICD-10-CM | POA: Diagnosis not present

## 2024-01-08 DIAGNOSIS — E039 Hypothyroidism, unspecified: Secondary | ICD-10-CM | POA: Diagnosis not present

## 2024-01-08 DIAGNOSIS — R9389 Abnormal findings on diagnostic imaging of other specified body structures: Secondary | ICD-10-CM | POA: Diagnosis not present

## 2024-01-08 DIAGNOSIS — Z7901 Long term (current) use of anticoagulants: Secondary | ICD-10-CM | POA: Diagnosis not present

## 2024-01-08 DIAGNOSIS — I480 Paroxysmal atrial fibrillation: Secondary | ICD-10-CM | POA: Diagnosis not present

## 2024-01-08 DIAGNOSIS — N2581 Secondary hyperparathyroidism of renal origin: Secondary | ICD-10-CM | POA: Diagnosis not present

## 2024-01-08 DIAGNOSIS — Z452 Encounter for adjustment and management of vascular access device: Secondary | ICD-10-CM | POA: Diagnosis not present

## 2024-01-08 DIAGNOSIS — Z91048 Other nonmedicinal substance allergy status: Secondary | ICD-10-CM

## 2024-01-08 DIAGNOSIS — K7460 Unspecified cirrhosis of liver: Secondary | ICD-10-CM | POA: Diagnosis not present

## 2024-01-08 DIAGNOSIS — I5042 Chronic combined systolic (congestive) and diastolic (congestive) heart failure: Secondary | ICD-10-CM | POA: Diagnosis present

## 2024-01-08 DIAGNOSIS — Z951 Presence of aortocoronary bypass graft: Secondary | ICD-10-CM

## 2024-01-08 DIAGNOSIS — N185 Chronic kidney disease, stage 5: Secondary | ICD-10-CM | POA: Diagnosis present

## 2024-01-08 DIAGNOSIS — Z992 Dependence on renal dialysis: Secondary | ICD-10-CM | POA: Diagnosis not present

## 2024-01-08 DIAGNOSIS — I10 Essential (primary) hypertension: Secondary | ICD-10-CM | POA: Diagnosis present

## 2024-01-08 DIAGNOSIS — K7581 Nonalcoholic steatohepatitis (NASH): Secondary | ICD-10-CM | POA: Diagnosis present

## 2024-01-08 DIAGNOSIS — K2100 Gastro-esophageal reflux disease with esophagitis, without bleeding: Secondary | ICD-10-CM | POA: Diagnosis present

## 2024-01-08 DIAGNOSIS — I4891 Unspecified atrial fibrillation: Secondary | ICD-10-CM

## 2024-01-08 DIAGNOSIS — R0602 Shortness of breath: Secondary | ICD-10-CM | POA: Diagnosis not present

## 2024-01-08 DIAGNOSIS — N186 End stage renal disease: Secondary | ICD-10-CM | POA: Diagnosis present

## 2024-01-08 DIAGNOSIS — E1122 Type 2 diabetes mellitus with diabetic chronic kidney disease: Secondary | ICD-10-CM | POA: Diagnosis present

## 2024-01-08 DIAGNOSIS — G4733 Obstructive sleep apnea (adult) (pediatric): Secondary | ICD-10-CM | POA: Diagnosis present

## 2024-01-08 DIAGNOSIS — Z7984 Long term (current) use of oral hypoglycemic drugs: Secondary | ICD-10-CM | POA: Diagnosis not present

## 2024-01-08 DIAGNOSIS — D693 Immune thrombocytopenic purpura: Secondary | ICD-10-CM | POA: Diagnosis present

## 2024-01-08 DIAGNOSIS — D731 Hypersplenism: Secondary | ICD-10-CM | POA: Diagnosis present

## 2024-01-08 DIAGNOSIS — M109 Gout, unspecified: Secondary | ICD-10-CM | POA: Insufficient documentation

## 2024-01-08 DIAGNOSIS — Z8249 Family history of ischemic heart disease and other diseases of the circulatory system: Secondary | ICD-10-CM

## 2024-01-08 DIAGNOSIS — Z888 Allergy status to other drugs, medicaments and biological substances status: Secondary | ICD-10-CM

## 2024-01-08 DIAGNOSIS — M25511 Pain in right shoulder: Secondary | ICD-10-CM | POA: Diagnosis present

## 2024-01-08 DIAGNOSIS — Z833 Family history of diabetes mellitus: Secondary | ICD-10-CM

## 2024-01-08 DIAGNOSIS — R000 Tachycardia, unspecified: Secondary | ICD-10-CM | POA: Diagnosis not present

## 2024-01-08 DIAGNOSIS — D696 Thrombocytopenia, unspecified: Secondary | ICD-10-CM | POA: Diagnosis present

## 2024-01-08 DIAGNOSIS — R001 Bradycardia, unspecified: Secondary | ICD-10-CM | POA: Diagnosis not present

## 2024-01-08 DIAGNOSIS — R1013 Epigastric pain: Secondary | ICD-10-CM

## 2024-01-08 DIAGNOSIS — Z881 Allergy status to other antibiotic agents status: Secondary | ICD-10-CM

## 2024-01-08 DIAGNOSIS — R110 Nausea: Secondary | ICD-10-CM | POA: Diagnosis present

## 2024-01-08 DIAGNOSIS — I4892 Unspecified atrial flutter: Secondary | ICD-10-CM | POA: Diagnosis not present

## 2024-01-08 DIAGNOSIS — I255 Ischemic cardiomyopathy: Secondary | ICD-10-CM | POA: Diagnosis present

## 2024-01-08 DIAGNOSIS — I129 Hypertensive chronic kidney disease with stage 1 through stage 4 chronic kidney disease, or unspecified chronic kidney disease: Secondary | ICD-10-CM | POA: Diagnosis not present

## 2024-01-08 DIAGNOSIS — N19 Unspecified kidney failure: Secondary | ICD-10-CM

## 2024-01-08 DIAGNOSIS — I120 Hypertensive chronic kidney disease with stage 5 chronic kidney disease or end stage renal disease: Secondary | ICD-10-CM | POA: Diagnosis not present

## 2024-01-08 DIAGNOSIS — I509 Heart failure, unspecified: Secondary | ICD-10-CM | POA: Diagnosis not present

## 2024-01-08 DIAGNOSIS — I517 Cardiomegaly: Secondary | ICD-10-CM | POA: Diagnosis not present

## 2024-01-08 DIAGNOSIS — R1110 Vomiting, unspecified: Principal | ICD-10-CM

## 2024-01-08 DIAGNOSIS — G8929 Other chronic pain: Secondary | ICD-10-CM | POA: Diagnosis present

## 2024-01-08 DIAGNOSIS — E7800 Pure hypercholesterolemia, unspecified: Secondary | ICD-10-CM | POA: Diagnosis present

## 2024-01-08 DIAGNOSIS — J4520 Mild intermittent asthma, uncomplicated: Secondary | ICD-10-CM | POA: Diagnosis not present

## 2024-01-08 DIAGNOSIS — Z959 Presence of cardiac and vascular implant and graft, unspecified: Secondary | ICD-10-CM | POA: Diagnosis not present

## 2024-01-08 HISTORY — DX: Chronic kidney disease, stage 5: N17.9

## 2024-01-08 LAB — COMPREHENSIVE METABOLIC PANEL
ALT: 19 U/L (ref 0–44)
AST: 19 U/L (ref 15–41)
Albumin: 2.8 g/dL — ABNORMAL LOW (ref 3.5–5.0)
Alkaline Phosphatase: 130 U/L — ABNORMAL HIGH (ref 38–126)
Anion gap: 10 (ref 5–15)
BUN: 65 mg/dL — ABNORMAL HIGH (ref 6–20)
CO2: 25 mmol/L (ref 22–32)
Calcium: 9.3 mg/dL (ref 8.9–10.3)
Chloride: 110 mmol/L (ref 98–111)
Creatinine, Ser: 7.17 mg/dL — ABNORMAL HIGH (ref 0.61–1.24)
GFR, Estimated: 9 mL/min — ABNORMAL LOW (ref >60)
Glucose, Bld: 107 mg/dL — ABNORMAL HIGH (ref 70–99)
Potassium: 3.5 mmol/L (ref 3.5–5.1)
Sodium: 145 mmol/L (ref 135–145)
Total Bilirubin: 0.6 mg/dL (ref 0.0–1.2)
Total Protein: 6 g/dL — ABNORMAL LOW (ref 6.5–8.1)

## 2024-01-08 LAB — CBC
HCT: 34.2 % — ABNORMAL LOW (ref 39.0–52.0)
Hemoglobin: 11.2 g/dL — ABNORMAL LOW (ref 13.0–17.0)
MCH: 33.7 pg (ref 26.0–34.0)
MCHC: 32.7 g/dL (ref 30.0–36.0)
MCV: 103 fL — ABNORMAL HIGH (ref 80.0–100.0)
Platelets: 65 10*3/uL — ABNORMAL LOW (ref 150–400)
RBC: 3.32 MIL/uL — ABNORMAL LOW (ref 4.22–5.81)
RDW: 14.3 % (ref 11.5–15.5)
WBC: 5.7 10*3/uL (ref 4.0–10.5)
nRBC: 0 % (ref 0.0–0.2)

## 2024-01-08 LAB — URINALYSIS, ROUTINE W REFLEX MICROSCOPIC
Bilirubin Urine: NEGATIVE
Glucose, UA: 500 mg/dL — AB
Ketones, ur: NEGATIVE mg/dL
Leukocytes,Ua: NEGATIVE
Nitrite: NEGATIVE
Protein, ur: 100 mg/dL — AB
Specific Gravity, Urine: 1.009 (ref 1.005–1.030)
pH: 6.5 (ref 5.0–8.0)

## 2024-01-08 LAB — LIPASE, BLOOD: Lipase: 126 U/L — ABNORMAL HIGH (ref 11–51)

## 2024-01-08 MED ORDER — METOCLOPRAMIDE HCL 5 MG/ML IJ SOLN
10.0000 mg | Freq: Once | INTRAMUSCULAR | Status: DC
  Filled 2024-01-08: qty 2

## 2024-01-08 NOTE — ED Triage Notes (Signed)
 Pt POV from home reporting fatigue and emesis, stage 4 CKD, scheduled to start dialysis, seen in ED on 2/4 for same, left due to wait times.

## 2024-01-08 NOTE — ED Notes (Signed)
 Labs obtained at [facility] today

## 2024-01-08 NOTE — Progress Notes (Signed)
 Plan of Care Note for accepted transfer   Patient: Sean [name] MRN: [id number]   DOA: 01/08/2024  Facility requesting transfer: [facility]   Requesting Provider: Dr. Doretha  Reason for transfer: AKI on CKD V  Facility course: 53 yr old man with BMI 56, HTN, HLD, DM, CAD, ITP, MGUS, PAF on Eliquis , OSA, and progressively worsening renal function who was being prepared for dialysis but now presents with fatigue, nausea, and vomiting.  He reports weight gain and increased swelling despite nausea and vomiting for a few days  Labs are most notable for BUN 65, creatinine 7.17, and lipase 126.  There were no acute findings on chest x-ray.  Dr. Tobie of nephrology was consulted by the ED physician and recommended medical admission to [facility].  Plan of care: The patient is accepted for admission to Progressive unit, at [facility].   Author: Evalene GORMAN Sprinkles, MD 01/08/2024  Check [email or web address] for on-call coverage.  Nursing staff, Please call [facility] Admits & Consults System-Wide number on Amion as soon as patient's arrival, so appropriate admitting provider can evaluate the pt.

## 2024-01-08 NOTE — ED Provider Notes (Signed)
 [place] EMERGENCY DEPARTMENT AT [facility] Provider Note   CSN: [id number] Arrival date & time: 01/08/24  1703     History  Chief Complaint  Patient presents with   Chronic Kidney Disease    Moishe Chad Mchargue is a 53 y.o. male.  Pt is a 53 y.o. male with coronary artery disease (prior CABG), ischemic cardiomyopathy, and CKD stage 5 who is currently in the process of getting scheduled for dialysis but has not yet had fistula placement who is presenting today with persistent nausea vomiting, fatigue, feeling generally unwell with increased weight gain and shortness of breath.  He reports the shortness of breath is a little worse in the last week.  He has not had a fever and reports his last bowel movement was on Tuesday.  He states that anytime he tries to eat he vomits.  He does have issues with feeling nauseated frequently but denies any localized abdominal pain.  He is not having any dysuria, frequency or urgency.  The history is provided by the patient.       Home Medications Prior to Admission medications   Medication Sig Start Date End Date Taking? Authorizing Provider  acetaminophen  (TYLENOL ) 500 MG tablet Take 1,500 mg by mouth once as needed for moderate pain (pain score 4-6), fever, headache or mild pain (pain score 1-3).    [provider]  allopurinol  (ZYLOPRIM ) 100 MG tablet Take 1 tablet (100 mg total) by mouth daily. 11/11/23   Singh, Prashant K, MD  amitriptyline  (ELAVIL ) 25 MG tablet Take 1 tablet (25 mg total) by mouth at bedtime. Patient taking differently: Take 25 mg by mouth at bedtime. 09/14/23   Kennedy-Smith, Colleen M, NP  apixaban  (ELIQUIS ) 5 MG TABS tablet Take 1 tablet (5 mg total) by mouth 2 (two) times daily. 07/28/23   Chandrasekhar, Stanly LABOR, MD  azelastine  (ASTELIN ) 0.1 % nasal spray Place 2 sprays into both nostrils 2 (two) times daily. Use in each nostril as directed Patient taking differently: Place 2 sprays into both nostrils  as needed for allergies. Use in each nostril as directed 12/01/23   Tobie Eldora NOVAK, MD  Cholecalciferol (VITAMIN D3) 125 MCG (5000 UT) CAPS Take 1 capsule (5,000 Units total) by mouth daily at 12 noon. 07/05/23   Lucius Krabbe, NP  Cyanocobalamin  (VITAMIN B-12 PO) Place 1 tablet under the tongue daily.    [provider]  diltiazem  (CARDIZEM  SR) 120 MG 12 hr capsule Take 1 capsule (120 mg total) by mouth 2 (two) times daily. 08/04/23   Jesus Bernardino MATSU, MD  docusate sodium  (COLACE) 100 MG capsule Take 2 capsules (200 mg total) by mouth 2 (two) times daily. Patient taking differently: Take 200 mg by mouth as needed for moderate constipation. 11/04/23   Dennise Lavada POUR, MD  Evolocumab  (REPATHA  SURECLICK) 140 MG/ML SOAJ INJECT THE CONTENTS OF 1 SYRINGE UNDER THE SKIN EVERY 14 DAYS 11/30/23   Santo Stanly LABOR, MD  famotidine  (PEPCID ) 20 MG tablet Take 1 tablet (20 mg total) by mouth 2 (two) times daily. 10/28/23   Kennedy-Smith, Colleen M, NP  famotidine -calcium  carbonate-magnesium  hydroxide (PEPCID  COMPLETE) 10-800-165 MG chewable tablet Chew 1 tablet by mouth 2 (two) times daily as needed. 11/09/23   Jesus Bernardino MATSU, MD  fluticasone  (FLONASE ) 50 MCG/ACT nasal spray Place 2 sprays into both nostrils daily. 12/01/23   Tobie Eldora NOVAK, MD  hydrocortisone  (ANUSOL -HC) 2.5 % rectal cream Apply rectally 2 (two) times daily. 11/04/23   Singh, Prashant K, MD  JARDIANCE  10 MG TABS tablet TAKE 1 TABLET(10 MG) BY MOUTH DAILY BEFORE BREAKFAST Patient taking differently: Take 10 mg by mouth daily. 10/13/23   Jesus Bernardino MATSU, MD  ketoconazole  (NIZORAL ) 2 % cream Apply 1 Application topically 2 (two) times daily. 12/14/23   Jesus Bernardino MATSU, MD  losartan  (COZAAR ) 50 MG tablet Take 1 tablet (50 mg total) by mouth daily. 12/03/23   Jesus Bernardino MATSU, MD  metoprolol  tartrate (LOPRESSOR ) 25 MG tablet Take 1 tablet (25 mg total) by mouth 2 (two) times daily. 01/01/24   Chandrasekhar, Stanly LABOR, MD  montelukast   (SINGULAIR ) 10 MG tablet TAKE 1 TABLET(10 MG) BY MOUTH DAILY Patient taking differently: Take 10 mg by mouth daily. 09/04/23   Jesus Bernardino MATSU, MD  nitroGLYCERIN  (NITROSTAT ) 0.4 MG SL tablet Place 1 tablet (0.4 mg total) under the tongue every 5 (five) minutes as needed for chest pain. 02/10/22   Chandrasekhar, Stanly LABOR, MD  ondansetron  (ZOFRAN -ODT) 4 MG disintegrating tablet Take 1 tablet (4 mg total) by mouth every 8 (eight) hours as needed for nausea or vomiting. 12/16/23   Jesus Bernardino MATSU, MD  pantoprazole  (PROTONIX ) 40 MG tablet TAKE 1 TABLET(40 MG) BY MOUTH TWICE DAILY 12/23/23   Jesus Bernardino MATSU, MD  polyethylene glycol (MIRALAX  / GLYCOLAX ) 17 g packet Mix 17 grams (1 packet) in 8 ounces of fluid and take by mouth daily. 11/05/23   Singh, Prashant K, MD  sucralfate  (CARAFATE ) 1 g tablet Take 1 tablet (1 g total) by mouth at bedtime. 01/01/24   Santo Stanly LABOR, MD  torsemide  (DEMADEX ) 10 MG tablet Take 1 tablet (10 mg total) by mouth daily. 11/05/23   Singh, Prashant K, MD  triamcinolone  (NASACORT ) 55 MCG/ACT AERO nasal inhaler Place 1 spray into the nose daily. Start with 1 spray each side twice a day for 3 days, then reduce to daily. 10/08/23   Lucius Krabbe, NP      Allergies    Vibra -tab [doxycycline ], Glucophage  [metformin ], Imdur  [isosorbide  dinitrate], Valtrex  [valacyclovir ], Augmentin [amoxicillin-pot clavulanate], and Tape    Review of Systems   Review of Systems  Physical Exam Updated Vital Signs BP (!) 136/55   Pulse 66   Temp 98.3 F (36.8 C)   Resp (!) 25   Ht 5' 11 (1.803 m)   Wt (!) 181.4 kg   SpO2 98%   BMI 55.79 kg/m  Physical Exam Vitals and nursing note reviewed.  Constitutional:      General: He is not in acute distress.    Appearance: He is well-developed.  HENT:     Head: Normocephalic and atraumatic.  Eyes:     Conjunctiva/sclera: Conjunctivae normal.     Pupils: Pupils are equal, round, and reactive to light.  Neck:     Comments: Jvd  present Cardiovascular:     Rate and Rhythm: Normal rate and regular rhythm.     Heart sounds: No murmur heard. Pulmonary:     Effort: Pulmonary effort is normal. No respiratory distress.     Breath sounds: Examination of the right-lower field reveals rales. Examination of the left-lower field reveals rales. Rales present. No wheezing.  Abdominal:     General: There is no distension.     Palpations: Abdomen is soft.     Tenderness: There is no abdominal tenderness. There is no guarding or rebound.  Musculoskeletal:        General: No tenderness. Normal range of motion.     Cervical back: Normal range of motion and  neck supple.     Right lower leg: Edema present.     Left lower leg: Edema present.  Skin:    General: Skin is warm and dry.     Findings: No erythema or rash.  Neurological:     Mental Status: He is alert and oriented to person, place, and time. Mental status is at baseline.  Psychiatric:        Behavior: Behavior normal.     ED Results / Procedures / Treatments   Labs (all labs ordered are listed, but only abnormal results are displayed) Labs Reviewed  LIPASE, BLOOD - Abnormal; Notable for the following components:      Result Value   Lipase 126 (*)    All other components within normal limits  COMPREHENSIVE METABOLIC PANEL - Abnormal; Notable for the following components:   Glucose, Bld 107 (*)    BUN 65 (*)    Creatinine, Ser 7.17 (*)    Total Protein 6.0 (*)    Albumin  2.8 (*)    Alkaline Phosphatase 130 (*)    GFR, Estimated 9 (*)    All other components within normal limits  CBC - Abnormal; Notable for the following components:   RBC 3.32 (*)    Hemoglobin 11.2 (*)    HCT 34.2 (*)    MCV 103.0 (*)    Platelets 65 (*)    All other components within normal limits  URINALYSIS, ROUTINE W REFLEX MICROSCOPIC - Abnormal; Notable for the following components:   Glucose, UA 500 (*)    Hgb urine dipstick SMALL (*)    Protein, ur 100 (*)    Bacteria, UA  RARE (*)    All other components within normal limits    EKG None  Radiology DG Chest Port 1 View Result Date: 01/08/2024 CLINICAL DATA:  Shortness of breath EXAM: PORTABLE CHEST 1 VIEW COMPARISON:  Chest x-ray 01/05/2024 FINDINGS: The cardiac silhouette is enlarged, unchanged. Sternotomy wires are present. There is no focal lung infiltrate, pleural effusion or pneumothorax. There stable elevation of the left hemidiaphragm. No acute fractures are seen. IMPRESSION: 1. No active disease. 2. Stable cardiomegaly. Electronically Signed   By: Greig Pique M.D.   On: 01/08/2024 19:12    Procedures Procedures    Medications Ordered in ED Medications  metoCLOPramide  (REGLAN ) injection 10 mg (has no administration in time range)    ED Course/ Medical Decision Making/ A&P                                 Medical Decision Making Amount and/or Complexity of Data Reviewed External Data Reviewed: notes. Labs: ordered. Decision-making details documented in ED Course. Radiology: ordered and independent interpretation performed. Decision-making details documented in ED Course. ECG/medicine tests: ordered and independent interpretation performed. Decision-making details documented in ED Course.  Risk Prescription drug management.   Pt with multiple medical problems and comorbidities and presenting today with a complaint that caries a high risk for morbidity and mortality.  Here today with complaints of nausea vomiting, fatigue and shortness of breath in the setting of renal disease.  Concern for worsening uremia potentially causing the nausea vomiting and worsening renal function.  Also concern for fluid overload.  Lower suspicion for ACS or infectious etiology at this time.  No localized abdominal pain.  9:24 PM I independently interpreted patient's labs and EKG.  UA today without acute findings, lipase is elevated at 126 on  patient's baseline is usually 60, CMP with worsening renal function now  at 7.17 with a BUN of 65 from 5.92 weeks ago.  Anion gap is normal, CBC without acute changes. I have independently visualized and interpreted pt's images today.  CXR wnl. Given worsening renal function, ongoing nausea will discuss with nephrology.  Patient does not have significant pain in his abdomen concerning for severe pancreatitis and low suspicion for cholecystitis or biliary colic.  Spoke with Dr. Tobie who agrees with admission and will consult when he arrives.  Consulted the hospitalist for admission.         Final Clinical Impression(s) / ED Diagnoses Final diagnoses:  Intractable vomiting  AKI (acute kidney injury) (HCC)  Stage 5 chronic kidney disease not on chronic dialysis (HCC)  Uremia    Rx / DC Orders ED Discharge Orders     None         Doretha Folks, MD 01/08/24 2124

## 2024-01-09 ENCOUNTER — Inpatient Hospital Stay (HOSPITAL_COMMUNITY): Payer: BC Managed Care – PPO

## 2024-01-09 DIAGNOSIS — K7460 Unspecified cirrhosis of liver: Secondary | ICD-10-CM | POA: Diagnosis present

## 2024-01-09 DIAGNOSIS — N185 Chronic kidney disease, stage 5: Secondary | ICD-10-CM

## 2024-01-09 DIAGNOSIS — K7581 Nonalcoholic steatohepatitis (NASH): Secondary | ICD-10-CM | POA: Diagnosis present

## 2024-01-09 DIAGNOSIS — J45909 Unspecified asthma, uncomplicated: Secondary | ICD-10-CM | POA: Diagnosis present

## 2024-01-09 DIAGNOSIS — I471 Supraventricular tachycardia, unspecified: Secondary | ICD-10-CM | POA: Diagnosis not present

## 2024-01-09 DIAGNOSIS — Z7984 Long term (current) use of oral hypoglycemic drugs: Secondary | ICD-10-CM | POA: Diagnosis not present

## 2024-01-09 DIAGNOSIS — I4892 Unspecified atrial flutter: Secondary | ICD-10-CM | POA: Diagnosis present

## 2024-01-09 DIAGNOSIS — R1110 Vomiting, unspecified: Secondary | ICD-10-CM | POA: Diagnosis not present

## 2024-01-09 DIAGNOSIS — N19 Unspecified kidney failure: Secondary | ICD-10-CM | POA: Diagnosis present

## 2024-01-09 DIAGNOSIS — E1122 Type 2 diabetes mellitus with diabetic chronic kidney disease: Secondary | ICD-10-CM | POA: Diagnosis present

## 2024-01-09 DIAGNOSIS — I4719 Other supraventricular tachycardia: Secondary | ICD-10-CM | POA: Diagnosis present

## 2024-01-09 DIAGNOSIS — Z79899 Other long term (current) drug therapy: Secondary | ICD-10-CM | POA: Diagnosis not present

## 2024-01-09 DIAGNOSIS — N179 Acute kidney failure, unspecified: Secondary | ICD-10-CM

## 2024-01-09 DIAGNOSIS — I2510 Atherosclerotic heart disease of native coronary artery without angina pectoris: Secondary | ICD-10-CM | POA: Diagnosis not present

## 2024-01-09 DIAGNOSIS — Z7901 Long term (current) use of anticoagulants: Secondary | ICD-10-CM | POA: Diagnosis not present

## 2024-01-09 DIAGNOSIS — Z992 Dependence on renal dialysis: Secondary | ICD-10-CM | POA: Diagnosis not present

## 2024-01-09 DIAGNOSIS — D731 Hypersplenism: Secondary | ICD-10-CM | POA: Diagnosis present

## 2024-01-09 DIAGNOSIS — I132 Hypertensive heart and chronic kidney disease with heart failure and with stage 5 chronic kidney disease, or end stage renal disease: Secondary | ICD-10-CM | POA: Diagnosis present

## 2024-01-09 DIAGNOSIS — M1A9XX0 Chronic gout, unspecified, without tophus (tophi): Secondary | ICD-10-CM | POA: Diagnosis present

## 2024-01-09 DIAGNOSIS — N2581 Secondary hyperparathyroidism of renal origin: Secondary | ICD-10-CM | POA: Diagnosis present

## 2024-01-09 DIAGNOSIS — N186 End stage renal disease: Secondary | ICD-10-CM | POA: Diagnosis present

## 2024-01-09 DIAGNOSIS — Z6841 Body Mass Index (BMI) 40.0 and over, adult: Secondary | ICD-10-CM | POA: Diagnosis not present

## 2024-01-09 DIAGNOSIS — D693 Immune thrombocytopenic purpura: Secondary | ICD-10-CM | POA: Diagnosis present

## 2024-01-09 DIAGNOSIS — I480 Paroxysmal atrial fibrillation: Secondary | ICD-10-CM | POA: Diagnosis present

## 2024-01-09 DIAGNOSIS — E039 Hypothyroidism, unspecified: Secondary | ICD-10-CM | POA: Diagnosis present

## 2024-01-09 DIAGNOSIS — D696 Thrombocytopenia, unspecified: Secondary | ICD-10-CM | POA: Diagnosis present

## 2024-01-09 DIAGNOSIS — J4520 Mild intermittent asthma, uncomplicated: Secondary | ICD-10-CM | POA: Diagnosis not present

## 2024-01-09 DIAGNOSIS — I5042 Chronic combined systolic (congestive) and diastolic (congestive) heart failure: Secondary | ICD-10-CM | POA: Diagnosis present

## 2024-01-09 DIAGNOSIS — I10 Essential (primary) hypertension: Secondary | ICD-10-CM | POA: Diagnosis not present

## 2024-01-09 LAB — CBC WITH DIFFERENTIAL/PLATELET
Abs Immature Granulocytes: 0.04 10*3/uL (ref 0.00–0.07)
Basophils Absolute: 0.1 10*3/uL (ref 0.0–0.1)
Basophils Relative: 1 %
Eosinophils Absolute: 0.6 10*3/uL — ABNORMAL HIGH (ref 0.0–0.5)
Eosinophils Relative: 9 %
HCT: 30.5 % — ABNORMAL LOW (ref 39.0–52.0)
Hemoglobin: 10.1 g/dL — ABNORMAL LOW (ref 13.0–17.0)
Immature Granulocytes: 1 %
Lymphocytes Relative: 27 %
Lymphs Abs: 1.6 10*3/uL (ref 0.7–4.0)
MCH: 33.9 pg (ref 26.0–34.0)
MCHC: 33.1 g/dL (ref 30.0–36.0)
MCV: 102.3 fL — ABNORMAL HIGH (ref 80.0–100.0)
Monocytes Absolute: 1 10*3/uL (ref 0.1–1.0)
Monocytes Relative: 15 %
Neutro Abs: 2.9 10*3/uL (ref 1.7–7.7)
Neutrophils Relative %: 47 %
Platelets: 64 10*3/uL — ABNORMAL LOW (ref 150–400)
RBC: 2.98 MIL/uL — ABNORMAL LOW (ref 4.22–5.81)
RDW: 14.5 % (ref 11.5–15.5)
WBC: 6.2 10*3/uL (ref 4.0–10.5)
nRBC: 0 % (ref 0.0–0.2)

## 2024-01-09 LAB — MAGNESIUM: Magnesium: 2.5 mg/dL — ABNORMAL HIGH (ref 1.7–2.4)

## 2024-01-09 LAB — COMPREHENSIVE METABOLIC PANEL
ALT: 21 U/L (ref 0–44)
AST: 21 U/L (ref 15–41)
Albumin: 2.2 g/dL — ABNORMAL LOW (ref 3.5–5.0)
Alkaline Phosphatase: 115 U/L (ref 38–126)
Anion gap: 12 (ref 5–15)
BUN: 65 mg/dL — ABNORMAL HIGH (ref 6–20)
CO2: 22 mmol/L (ref 22–32)
Calcium: 8.9 mg/dL (ref 8.9–10.3)
Chloride: 110 mmol/L (ref 98–111)
Creatinine, Ser: 7.44 mg/dL — ABNORMAL HIGH (ref 0.61–1.24)
GFR, Estimated: 8 mL/min — ABNORMAL LOW (ref >60)
Glucose, Bld: 131 mg/dL — ABNORMAL HIGH (ref 70–99)
Potassium: 3.7 mmol/L (ref 3.5–5.1)
Sodium: 144 mmol/L (ref 135–145)
Total Bilirubin: 0.9 mg/dL (ref 0.0–1.2)
Total Protein: 5.6 g/dL — ABNORMAL LOW (ref 6.5–8.1)

## 2024-01-09 LAB — TROPONIN I (HIGH SENSITIVITY): Troponin I (High Sensitivity): 7 ng/L (ref <18)

## 2024-01-09 LAB — CK: Total CK: 96 U/L (ref 49–397)

## 2024-01-09 LAB — PHOSPHORUS: Phosphorus: 5.5 mg/dL — ABNORMAL HIGH (ref 2.5–4.6)

## 2024-01-09 MED ORDER — ACETAMINOPHEN 650 MG RE SUPP: 650.0000 mg | Freq: Four times a day (QID) | RECTAL | Status: DC | PRN

## 2024-01-09 MED ORDER — MELATONIN 3 MG PO TABS
3.0000 mg | ORAL_TABLET | Freq: Every evening | ORAL | Status: DC | PRN
  Administered 2024-01-09 – 2024-01-10 (×2): 3 mg via ORAL
  Filled 2024-01-09 (×2): qty 1

## 2024-01-09 MED ORDER — ALLOPURINOL 100 MG PO TABS
100.0000 mg | ORAL_TABLET | Freq: Every day | ORAL | Status: DC
  Administered 2024-01-09 – 2024-01-16 (×7): 100 mg via ORAL
  Filled 2024-01-09 (×7): qty 1

## 2024-01-09 MED ORDER — MONTELUKAST SODIUM 10 MG PO TABS
10.0000 mg | ORAL_TABLET | Freq: Every day | ORAL | Status: DC
  Administered 2024-01-09 – 2024-01-16 (×7): 10 mg via ORAL
  Filled 2024-01-09 (×7): qty 1

## 2024-01-09 MED ORDER — HYDRALAZINE HCL 20 MG/ML IJ SOLN: 10.0000 mg | INTRAMUSCULAR | Status: DC | PRN

## 2024-01-09 MED ORDER — ALUM & MAG HYDROXIDE-SIMETH 200-200-20 MG/5ML PO SUSP
30.0000 mL | Freq: Four times a day (QID) | ORAL | Status: DC | PRN
  Administered 2024-01-09: 30 mL via ORAL
  Filled 2024-01-09 (×2): qty 30

## 2024-01-09 MED ORDER — TORSEMIDE 20 MG PO TABS: 20.0000 mg | ORAL_TABLET | Freq: Every day | ORAL | Status: DC

## 2024-01-09 MED ORDER — SODIUM CHLORIDE 0.9 % IV SOLN
12.5000 mg | Freq: Four times a day (QID) | INTRAVENOUS | Status: DC | PRN
  Administered 2024-01-09: 12.5 mg via INTRAVENOUS
  Filled 2024-01-09: qty 12.5

## 2024-01-09 MED ORDER — EVOLOCUMAB 140 MG/ML ~~LOC~~ SOAJ
140.0000 mg | SUBCUTANEOUS | Status: DC
Start: 2024-01-10 — End: 2024-01-10
  Administered 2024-01-09: 140 mg via SUBCUTANEOUS

## 2024-01-09 MED ORDER — LOSARTAN POTASSIUM 50 MG PO TABS: 50.0000 mg | ORAL_TABLET | Freq: Every day | ORAL | Status: DC

## 2024-01-09 MED ORDER — PANTOPRAZOLE SODIUM 40 MG PO TBEC
40.0000 mg | DELAYED_RELEASE_TABLET | Freq: Two times a day (BID) | ORAL | Status: DC
  Administered 2024-01-09 – 2024-01-16 (×13): 40 mg via ORAL
  Filled 2024-01-09 (×13): qty 1

## 2024-01-09 MED ORDER — CALCIUM ACETATE (PHOS BINDER) 667 MG PO CAPS
667.0000 mg | ORAL_CAPSULE | Freq: Three times a day (TID) | ORAL | Status: DC
  Administered 2024-01-09 – 2024-01-15 (×11): 667 mg via ORAL
  Filled 2024-01-09 (×12): qty 1

## 2024-01-09 MED ORDER — ACETAMINOPHEN 325 MG PO TABS
650.0000 mg | ORAL_TABLET | Freq: Four times a day (QID) | ORAL | Status: DC | PRN
  Administered 2024-01-09 – 2024-01-12 (×4): 650 mg via ORAL
  Filled 2024-01-09 (×4): qty 2

## 2024-01-09 MED ORDER — FAMOTIDINE 20 MG PO TABS
20.0000 mg | ORAL_TABLET | Freq: Two times a day (BID) | ORAL | Status: DC
  Administered 2024-01-09 – 2024-01-12 (×5): 20 mg via ORAL
  Filled 2024-01-09 (×5): qty 1

## 2024-01-09 MED ORDER — LABETALOL HCL 5 MG/ML IV SOLN: 20.0000 mg | INTRAVENOUS | Status: DC | PRN

## 2024-01-09 MED ORDER — ONDANSETRON HCL 4 MG/2ML IJ SOLN
4.0000 mg | Freq: Four times a day (QID) | INTRAMUSCULAR | Status: DC | PRN
  Administered 2024-01-09 – 2024-01-10 (×4): 4 mg via INTRAVENOUS
  Filled 2024-01-09 (×4): qty 2

## 2024-01-09 MED ORDER — TORSEMIDE 20 MG PO TABS: 10.0000 mg | ORAL_TABLET | Freq: Every day | ORAL | Status: DC

## 2024-01-09 MED ORDER — EMPAGLIFLOZIN 10 MG PO TABS
10.0000 mg | ORAL_TABLET | Freq: Every day | ORAL | Status: DC
  Administered 2024-01-09 – 2024-01-16 (×7): 10 mg via ORAL
  Filled 2024-01-09 (×7): qty 1

## 2024-01-09 MED ORDER — AMITRIPTYLINE HCL 50 MG PO TABS
25.0000 mg | ORAL_TABLET | Freq: Every day | ORAL | Status: DC
  Administered 2024-01-09 – 2024-01-15 (×7): 25 mg via ORAL
  Filled 2024-01-09 (×7): qty 1

## 2024-01-09 NOTE — H&P (Signed)
 History and Physical    Patient: Sean [name] FMW:[id number] DOB: 11/05/1971 DOA: 01/08/2024 DOS: the patient was seen and examined on 01/09/2024 PCP: Jesus Bernardino MATSU, MD  Patient coming from: Home > [facility] ER> [facility] UNIT  Chief Complaint:  Chief Complaint  Patient presents with   Chronic Kidney Disease   HPI: Sean [name] is a 53 y.o. male with medical history significant of chronic kidney disease.  Patient has been medically managed with diuretics as well as Jardiance  as well as losartan  in the past.  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  Patient states that for approximately 1 or 2 months he has had intermittent episodes of nausea, poor appetite.  He denies any chest pain fever diarrhea.  Since the last 6 days or so patient actually started throwing up.  This was very variable.  Sometimes he would throw up 3 times in a day sometimes not.  Patient has been able to intermittently tolerate diet.  Patient is still making urine.  Patient has developed increasing ankle swelling bilaterally.  Again no fever no chest pain no palpitation no sensation of shortness of breath.  Patient came with above symptoms to our [facility] ER initially about 4 days ago.  But seem to have left without notice at the time.  And then returned again yesterday to [facility].  Patient is noted to have BUN of 65 and creatinine of 7.17 which are much worse than his previous baseline.  Patient is transferred to [facility] inpatient unit.  Medical evaluation is sought.  Patient is s/p evaluation by nephrology service Dr. Tobie as well as vascular surgical team.  Overall plan/impression is uremia with need to start inpatient dialysis after placement of fistula in the left upper extremity as well as tunneled hemodialysis catheter.  Patient is pretty well-informed  Review of Systems: As mentioned in the history of present illness. All other systems reviewed and are negative. Past Medical History:   Diagnosis Date   Abnormal liver enzymes    a. Sees a doctor in [place].   Cardiac cirrhosis    a. possible elevated LFTs/low platelets felt due to cardiac cirrhosis per 2017 admission.   Chronic combined systolic and diastolic CHF (congestive heart failure) (HCC)    CKD (chronic kidney disease) stage 3, GFR 30-59 ml/min (HCC) 01/04/2016   Lab Results  Component  Value  Date/Time     GFR  29.24 (L)  09/30/2022 02:51 PM     GFR  27.27 (L)  09/24/2022 03:40 PM     GFR  24.10 (L)  09/03/2022 03:45 PM     GFR  18.98 (L)  08/25/2022 12:21 PM     GFR  38.98 (L)  04/24/2022 02:39 PM         CKD (chronic kidney disease), stage II    Stage 4   Congenital heart defect    a. rightward rotation of heart, almost dextrocardia   Coronary artery disease    a. s/p CABGx1 in 12/2015.   Gastric polyps 11/25/2022   Gastritis and gastroduodenitis 11/25/2022          Gastroesophageal reflux disease with esophagitis and hemorrhage 04/04/2021   Hematuria    a. Chronic hx of this, no prior etiology determined through workup per patient.   History of umbilical hernia repair 07/10/2011   History of hernia surgery twice prior   Hypercholesteremia    a. Prev taken off statin due to abnormal liver  function.   Hypertension    Incisional hernia 07/10/2011   History of hernia surgery twice prior   Morbid obesity (HCC)    Morbid obesity with BMI of 50.0-59.9, adult (HCC) 01/04/2016   After [facility] [facility] doctor is setting him up with a wellness clinic to help with weight losS  He reports not having tried anything for weight loss either diet or or medicine or surgery in the past  We failed to get Wegovy  10/2022      Wt Readings from Last 10 Encounters:  11/13/22  (!) 384 lb 12.8 oz (174.5 kg)  10/13/22  (!) 385 lb (174.6 kg)  09/30/22  (!) 384 lb (174.2 kg)  09/24/22  (!) 382 lb (1   Nausea and vomiting 11/25/2022   OSA on CPAP    Paroxysmal atrial flutter (HCC) 02/12/2016   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   LIMA to  LAD   Sinus bradycardia    Thrombocytopenia (HCC)    Past Surgical History:  Procedure Laterality Date   APPENDECTOMY     BIOPSY  02/19/2021   Procedure: BIOPSY;  Surgeon: Eda Iha, MD;  Location: [facility] ENDOSCOPY;  Service: Gastroenterology;;   BIOPSY  11/25/2022   Procedure: BIOPSY;  Surgeon: Eda Iha, MD;  Location: [facility];  Service: Gastroenterology;;   CARDIAC CATHETERIZATION N/A 12/24/2015   Procedure: Right/Left Heart Cath and Coronary Angiography;  Surgeon: Toribio JONELLE Fuel, MD;  Location: [facility] INVASIVE CV LAB;  Service: Cardiovascular;  Laterality: N/A;   CARDIOVERSION N/A 02/14/2016   Procedure: CARDIOVERSION;  Surgeon: Vinie JAYSON Maxcy, MD;  Location: [facility] ENDOSCOPY;  Service: Cardiovascular;  Laterality: N/A;   COLONOSCOPY WITH PROPOFOL  N/A 02/19/2021   Procedure: COLONOSCOPY WITH PROPOFOL ;  Surgeon: Eda Iha, MD;  Location: [facility]OSCOPY;  Service: Gastroenterology;  Laterality: N/A;   CORONARY ARTERY BYPASS GRAFT N/A 12/26/2015   Procedure: Off Pump Coronary Artery Bypass Grafting times one using left internal mammary artery;  Surgeon: Sudie VEAR Laine, MD;  Location: [facility] OR;  Service: Open Heart Surgery;  Laterality: N/A;   ESOPHAGOGASTRODUODENOSCOPY (EGD) WITH PROPOFOL  N/A 02/19/2021   Procedure: ESOPHAGOGASTRODUODENOSCOPY (EGD) WITH PROPOFOL ;  Surgeon: Eda Iha, MD;  Location: [facility];  Service: Gastroenterology;  Laterality: N/A;   ESOPHAGOGASTRODUODENOSCOPY (EGD) WITH PROPOFOL  N/A 11/25/2022   Procedure: ESOPHAGOGASTRODUODENOSCOPY (EGD) WITH PROPOFOL ;  Surgeon: Eda Iha, MD;  Location: [facility] ENDOSCOPY;  Service: Gastroenterology;  Laterality: N/A;   GALLBLADDER SURGERY     HERNIA REPAIR     IR TRANSCATHETER BX  06/26/2023   IR US  GUIDE VASC ACCESS RIGHT  06/26/2023   IR VENOGRAM HEPATIC W HEMODYNAMIC EVALUATION  06/26/2023   LEFT HEART CATH AND CORS/GRAFTS ANGIOGRAPHY N/A 04/15/2017   Procedure: Left Heart Cath and Cors/Grafts  Angiography;  Surgeon: Burnard Debby LABOR, MD;  Location: [facility] INVASIVE CV LAB;  Service: Cardiovascular;  Laterality: N/A;   LEFT HEART CATH AND CORS/GRAFTS ANGIOGRAPHY N/A 11/02/2020   Procedure: LEFT HEART CATH AND CORS/GRAFTS ANGIOGRAPHY;  Surgeon: Wonda Sharper, MD;  Location: [facility] INVASIVE CV LAB;  Service: Cardiovascular;  Laterality: N/A;   POLYPECTOMY  02/19/2021   Procedure: POLYPECTOMY;  Surgeon: Eda Iha, MD;  Location: [facility] ENDOSCOPY;  Service: Gastroenterology;;   TEE WITHOUT CARDIOVERSION N/A 12/26/2015   Procedure: TRANSESOPHAGEAL ECHOCARDIOGRAM (TEE);  Surgeon: Sudie VEAR Laine, MD;  Location: [facility] OR;  Service: Open Heart Surgery;  Laterality: N/A;   TEE WITHOUT CARDIOVERSION N/A 02/14/2016   Procedure: TRANSESOPHAGEAL ECHOCARDIOGRAM (TEE);  Surgeon: Vinie JAYSON Maxcy, MD;  Location: [facility] ENDOSCOPY;  Service: Cardiovascular;  Laterality: N/A;   Social History:  reports that he has never smoked. He has never used smokeless tobacco. He reports current alcohol use. He reports that he does not use drugs.  Allergies  Allergen Reactions   Vibra -Tab [Doxycycline ] Shortness Of Breath   Glucophage  [Metformin ] Other (See Comments)    Pt told by his nephrologist not to take this medication due to his kidney function.   Imdur  [Isosorbide  Dinitrate] Diarrhea   Valtrex  [Valacyclovir ] Itching   Augmentin [Amoxicillin-Pot Clavulanate] Itching   Tape Rash    Family History  Problem Relation Age of Onset   CAD Father        4 stents ~ 77   Diabetes Father    Diabetes Maternal Grandfather    Diabetes Maternal Uncle    Heart attack Other    Hyperlipidemia Other     Prior to Admission medications   Medication Sig Start Date End Date Taking? Authorizing Provider  acetaminophen  (TYLENOL ) 500 MG tablet Take 1,500 mg by mouth once as needed for moderate pain (pain score 4-6), fever, headache or mild pain (pain score 1-3).    [provider]  allopurinol  (ZYLOPRIM ) 100 MG tablet Take 1  tablet (100 mg total) by mouth daily. 11/11/23   Singh, Prashant K, MD  amitriptyline  (ELAVIL ) 25 MG tablet Take 1 tablet (25 mg total) by mouth at bedtime. Patient taking differently: Take 25 mg by mouth at bedtime. 09/14/23   Kennedy-Smith, Colleen M, NP  apixaban  (ELIQUIS ) 5 MG TABS tablet Take 1 tablet (5 mg total) by mouth 2 (two) times daily. 07/28/23   Chandrasekhar, Stanly A, MD  azelastine  (ASTELIN ) 0.1 % nasal spray Place 2 sprays into both nostrils 2 (two) times daily. Use in each nostril as directed Patient taking differently: Place 2 sprays into both nostrils as needed for allergies. Use in each nostril as directed 12/01/23   Tobie Eldora NOVAK, MD  Cholecalciferol (VITAMIN D3) 125 MCG (5000 UT) CAPS Take 1 capsule (5,000 Units total) by mouth daily at 12 noon. 07/05/23   Lucius Krabbe, NP  Cyanocobalamin  (VITAMIN B-12 PO) Place 1 tablet under the tongue daily.    [provider]  diltiazem  (CARDIZEM  SR) 120 MG 12 hr capsule Take 1 capsule (120 mg total) by mouth 2 (two) times daily. 08/04/23   Jesus Bernardino MATSU, MD  docusate sodium  (COLACE) 100 MG capsule Take 2 capsules (200 mg total) by mouth 2 (two) times daily. Patient taking differently: Take 200 mg by mouth as needed for moderate constipation. 11/04/23   Dennise Lavada POUR, MD  Evolocumab  (REPATHA  SURECLICK) 140 MG/ML SOAJ INJECT THE CONTENTS OF 1 SYRINGE UNDER THE SKIN EVERY 14 DAYS 11/30/23   Chandrasekhar, Mahesh A, MD  famotidine  (PEPCID ) 20 MG tablet Take 1 tablet (20 mg total) by mouth 2 (two) times daily. 10/28/23   Kennedy-Smith, Colleen M, NP  famotidine -calcium  carbonate-magnesium  hydroxide (PEPCID  COMPLETE) 10-800-165 MG chewable tablet Chew 1 tablet by mouth 2 (two) times daily as needed. 11/09/23   Jesus Bernardino MATSU, MD  fluticasone  (FLONASE ) 50 MCG/ACT nasal spray Place 2 sprays into both nostrils daily. 12/01/23   Tobie Eldora NOVAK, MD  hydrocortisone  (ANUSOL -HC) 2.5 % rectal cream Apply rectally 2 (two) times daily.  11/04/23   Singh, Prashant K, MD  JARDIANCE  10 MG TABS tablet TAKE 1 TABLET(10 MG) BY MOUTH DAILY BEFORE BREAKFAST Patient taking differently: Take 10 mg by mouth daily. 10/13/23   Jesus Bernardino MATSU, MD  ketoconazole  (NIZORAL ) 2 % cream  Apply 1 Application topically 2 (two) times daily. 12/14/23   Jesus Bernardino MATSU, MD  losartan  (COZAAR ) 50 MG tablet Take 1 tablet (50 mg total) by mouth daily. 12/03/23   Jesus Bernardino MATSU, MD  metoprolol  tartrate (LOPRESSOR ) 25 MG tablet Take 1 tablet (25 mg total) by mouth 2 (two) times daily. 01/01/24   Chandrasekhar, Stanly A, MD  montelukast  (SINGULAIR ) 10 MG tablet TAKE 1 TABLET(10 MG) BY MOUTH DAILY Patient taking differently: Take 10 mg by mouth daily. 09/04/23   Jesus Bernardino MATSU, MD  nitroGLYCERIN  (NITROSTAT ) 0.4 MG SL tablet Place 1 tablet (0.4 mg total) under the tongue every 5 (five) minutes as needed for chest pain. 02/10/22   Chandrasekhar, Stanly LABOR, MD  ondansetron  (ZOFRAN -ODT) 4 MG disintegrating tablet Take 1 tablet (4 mg total) by mouth every 8 (eight) hours as needed for nausea or vomiting. 12/16/23   Jesus Bernardino MATSU, MD  pantoprazole  (PROTONIX ) 40 MG tablet TAKE 1 TABLET(40 MG) BY MOUTH TWICE DAILY 12/23/23   Jesus Bernardino MATSU, MD  polyethylene glycol (MIRALAX  / GLYCOLAX ) 17 g packet Mix 17 grams (1 packet) in 8 ounces of fluid and take by mouth daily. 11/05/23   Singh, Prashant K, MD  sucralfate  (CARAFATE ) 1 g tablet Take 1 tablet (1 g total) by mouth at bedtime. 01/01/24   Santo Stanly LABOR, MD  torsemide  (DEMADEX ) 10 MG tablet Take 1 tablet (10 mg total) by mouth daily. 11/05/23   Singh, Prashant K, MD  triamcinolone  (NASACORT ) 55 MCG/ACT AERO nasal inhaler Place 1 spray into the nose daily. Start with 1 spray each side twice a day for 3 days, then reduce to daily. 10/08/23   Lucius Krabbe, NP    Physical Exam: Vitals:   01/09/24 0117 01/09/24 0206 01/09/24 0418 01/09/24 0808  BP: (!) 146/60 129/61 137/65 138/67  Pulse: 63 (!) 58 (!) 58 (!) 57   Resp: 17 (!) 22 20 20   Temp: 98.6 F (37 C) 98.1 F (36.7 C) 97.6 F (36.4 C) 97.9 F (36.6 C)  TempSrc: Oral Oral Axillary Oral  SpO2: 96% 97% 97% 98%  Weight:  (!) 178.3 kg    Height:  5' 11 (1.803 m)     General: Obese appearing gentleman appears to be in no distress.  Is pretty alert and awake, sitting in bed with feet on the floor, tolerating breakfast. Respiratory exam: Bilateral intravesicular Cardiovascular exam S1-S2 normal Abdomen all quadrant soft nontender obese Extremities warm no edema except for ankle bilaterally where I noticed 1-2+ edema.  Distal function intact. Data Reviewed:  Labs on Admission:  Results for orders placed or performed during the hospital encounter of 01/08/24 (from the past 24 hours)  Lipase, blood     Status: Abnormal   Collection Time: 01/08/24  7:19 PM  Result Value Ref Range   Lipase 126 (H) 11 - 51 U/L  Comprehensive metabolic panel     Status: Abnormal   Collection Time: 01/08/24  7:19 PM  Result Value Ref Range   Sodium 145 135 - 145 mmol/L   Potassium 3.5 3.5 - 5.1 mmol/L   Chloride 110 98 - 111 mmol/L   CO2 25 22 - 32 mmol/L   Glucose, Bld 107 (H) 70 - 99 mg/dL   BUN 65 (H) 6 - 20 mg/dL   Creatinine, Ser 2.82 (H) 0.61 - 1.24 mg/dL   Calcium  9.3 8.9 - 10.3 mg/dL   Total Protein 6.0 (L) 6.5 - 8.1 g/dL   Albumin  2.8 (L) 3.5 -  5.0 g/dL   AST 19 15 - 41 U/L   ALT 19 0 - 44 U/L   Alkaline Phosphatase 130 (H) 38 - 126 U/L   Total Bilirubin 0.6 0.0 - 1.2 mg/dL   GFR, Estimated 9 (L) >60 mL/min   Anion gap 10 5 - 15  CBC     Status: Abnormal   Collection Time: 01/08/24  7:19 PM  Result Value Ref Range   WBC 5.7 4.0 - 10.5 K/uL   RBC 3.32 (L) 4.22 - 5.81 MIL/uL   Hemoglobin 11.2 (L) 13.0 - 17.0 g/dL   HCT 65.7 (L) 60.9 - 47.9 %   MCV 103.0 (H) 80.0 - 100.0 fL   MCH 33.7 26.0 - 34.0 pg   MCHC 32.7 30.0 - 36.0 g/dL   RDW 85.6 88.4 - 84.4 %   Platelets 65 (L) 150 - 400 K/uL   nRBC 0.0 0.0 - 0.2 %  Urinalysis, Routine w reflex  microscopic -Urine, Clean Catch     Status: Abnormal   Collection Time: 01/08/24  7:37 PM  Result Value Ref Range   Color, Urine YELLOW YELLOW   APPearance CLEAR CLEAR   Specific Gravity, Urine 1.009 1.005 - 1.030   pH 6.5 5.0 - 8.0   Glucose, UA 500 (A) NEGATIVE mg/dL   Hgb urine dipstick SMALL (A) NEGATIVE   Bilirubin Urine NEGATIVE NEGATIVE   Ketones, ur NEGATIVE NEGATIVE mg/dL   Protein, ur 899 (A) NEGATIVE mg/dL   Nitrite NEGATIVE NEGATIVE   Leukocytes,Ua NEGATIVE NEGATIVE   RBC / HPF 6-10 0 - 5 RBC/hpf   WBC, UA 0-5 0 - 5 WBC/hpf   Bacteria, UA RARE (A) NONE SEEN   Squamous Epithelial / HPF 0-5 0 - 5 /HPF  CBC with Differential/Platelet     Status: Abnormal   Collection Time: 01/09/24  6:39 AM  Result Value Ref Range   WBC 6.2 4.0 - 10.5 K/uL   RBC 2.98 (L) 4.22 - 5.81 MIL/uL   Hemoglobin 10.1 (L) 13.0 - 17.0 g/dL   HCT 69.4 (L) 60.9 - 47.9 %   MCV 102.3 (H) 80.0 - 100.0 fL   MCH 33.9 26.0 - 34.0 pg   MCHC 33.1 30.0 - 36.0 g/dL   RDW 85.4 88.4 - 84.4 %   Platelets 64 (L) 150 - 400 K/uL   nRBC 0.0 0.0 - 0.2 %   Neutrophils Relative % 47 %   Neutro Abs 2.9 1.7 - 7.7 K/uL   Lymphocytes Relative 27 %   Lymphs Abs 1.6 0.7 - 4.0 K/uL   Monocytes Relative 15 %   Monocytes Absolute 1.0 0.1 - 1.0 K/uL   Eosinophils Relative 9 %   Eosinophils Absolute 0.6 (H) 0.0 - 0.5 K/uL   Basophils Relative 1 %   Basophils Absolute 0.1 0.0 - 0.1 K/uL   Immature Granulocytes 1 %   Abs Immature Granulocytes 0.04 0.00 - 0.07 K/uL  Comprehensive metabolic panel     Status: Abnormal   Collection Time: 01/09/24  6:39 AM  Result Value Ref Range   Sodium 144 135 - 145 mmol/L   Potassium 3.7 3.5 - 5.1 mmol/L   Chloride 110 98 - 111 mmol/L   CO2 22 22 - 32 mmol/L   Glucose, Bld 131 (H) 70 - 99 mg/dL   BUN 65 (H) 6 - 20 mg/dL   Creatinine, Ser 2.55 (H) 0.61 - 1.24 mg/dL   Calcium  8.9 8.9 - 10.3 mg/dL   Total Protein 5.6 (L) 6.5 -  8.1 g/dL   Albumin  2.2 (L) 3.5 - 5.0 g/dL   AST 21 15 - 41  U/L   ALT 21 0 - 44 U/L   Alkaline Phosphatase 115 38 - 126 U/L   Total Bilirubin 0.9 0.0 - 1.2 mg/dL   GFR, Estimated 8 (L) >60 mL/min   Anion gap 12 5 - 15  Magnesium      Status: Abnormal   Collection Time: 01/09/24  6:39 AM  Result Value Ref Range   Magnesium  2.5 (H) 1.7 - 2.4 mg/dL  Phosphorus     Status: Abnormal   Collection Time: 01/09/24  6:39 AM  Result Value Ref Range   Phosphorus 5.5 (H) 2.5 - 4.6 mg/dL   Basic Metabolic Panel: Recent Labs  Lab 01/05/24 1238 01/08/24 1919 01/09/24 0639  NA 142 145 144  K 3.9 3.5 3.7  CL 107 110 110  CO2 24 25 22   GLUCOSE 151* 107* 131*  BUN 57* 65* 65*  CREATININE 6.95* 7.17* 7.44*  CALCIUM  8.9 9.3 8.9  MG 2.4  --  2.5*  PHOS  --   --  5.5*   Liver Function Tests: Recent Labs  Lab 01/05/24 1238 01/08/24 1919 01/09/24 0639  AST 27 19 21   ALT 26 19 21   ALKPHOS 140* 130* 115  BILITOT 0.7 0.6 0.9  PROT 5.9* 6.0* 5.6*  ALBUMIN  2.4* 2.8* 2.2*   Recent Labs  Lab 01/08/24 1919  LIPASE 126*   No results for input(s): AMMONIA in the last 168 hours. CBC: Recent Labs  Lab 01/05/24 1238 01/08/24 1919 01/09/24 0639  WBC 6.5 5.7 6.2  NEUTROABS 3.6  --  2.9  HGB 10.8* 11.2* 10.1*  HCT 32.8* 34.2* 30.5*  MCV 103.5* 103.0* 102.3*  PLT 66* 65* 64*   Cardiac Enzymes: No results for input(s): CKTOTAL, CKMB, CKMBINDEX, TROPONINIHS in the last 168 hours.  BNP (last 3 results) No results for input(s): PROBNP in the last 8760 hours. CBG: No results for input(s): GLUCAP in the last 168 hours.  Radiological Exams on Admission:  VAS US  UPPER EXT VEIN MAPPING (PRE-OP  AVF) Result Date: 01/09/2024 UPPER EXTREMITY VEIN MAPPING Patient Name:  Sean [name]  Date of Exam:   01/09/2024 Medical Rec #: [id number]            Accession #:    [id number] Date of Birth: 01/01/1971            Patient Gender: M Patient Age:   47 years Exam Location:  [facility] Procedure:      VAS US  UPPER EXT VEIN MAPPING (PRE-OP   AVF) Referring Phys: JAY PATEL --------------------------------------------------------------------------------  Indications: Pre-access. History: CKD /ESRD.  Comparison Study: No prior study on file Performing Technologist: Alberta Lis RVS  Examination Guidelines: A complete evaluation includes B-mode imaging, spectral Doppler, color Doppler, and power Doppler as needed of all accessible portions of each vessel. Bilateral testing is considered an integral part of a complete examination. Limited examinations for reoccurring indications may be performed as noted. +-----------------+-------------+----------+---------+ Right Cephalic   Diameter (cm)Depth (cm)Findings  +-----------------+-------------+----------+---------+ Prox upper arm       0.47        0.63             +-----------------+-------------+----------+---------+ Mid upper arm        0.47        0.41             +-----------------+-------------+----------+---------+ Dist upper arm       0.51  0.43             +-----------------+-------------+----------+---------+ Antecubital fossa    0.60        0.31             +-----------------+-------------+----------+---------+ Prox forearm       0.39/0.35  0.33/0.78 branching +-----------------+-------------+----------+---------+ Mid forearm          0.46        0.46             +-----------------+-------------+----------+---------+ Dist forearm         0.38        0.39             +-----------------+-------------+----------+---------+ Wrist                0.30        0.53             +-----------------+-------------+----------+---------+ +-----------------+-------------+----------+---------+ Right Basilic    Diameter (cm)Depth (cm)Findings  +-----------------+-------------+----------+---------+ Prox upper arm       0.65        2.11    origin   +-----------------+-------------+----------+---------+ Mid upper arm        0.52        1.18              +-----------------+-------------+----------+---------+ Dist upper arm       0.40        0.71             +-----------------+-------------+----------+---------+ Antecubital fossa    0.32        0.36             +-----------------+-------------+----------+---------+ Prox forearm         0.34        0.29             +-----------------+-------------+----------+---------+ Mid forearm        0.35/0.25  0.36/0.24 branching +-----------------+-------------+----------+---------+ Distal forearm       0.21        0.42             +-----------------+-------------+----------+---------+ Wrist                0.37        0.58             +-----------------+-------------+----------+---------+ +-----------------+-------------+----------+---------+ Left Cephalic    Diameter (cm)Depth (cm)Findings  +-----------------+-------------+----------+---------+ Prox upper arm       0.65        0.47             +-----------------+-------------+----------+---------+ Mid upper arm        0.48        0.55             +-----------------+-------------+----------+---------+ Dist upper arm       0.63        0.48             +-----------------+-------------+----------+---------+ Antecubital fossa    0.79        0.45             +-----------------+-------------+----------+---------+ Prox forearm       0.49/0.45  0.70/0.62 branching +-----------------+-------------+----------+---------+ Mid forearm          0.42        0.46             +-----------------+-------------+----------+---------+ Dist forearm         0.38        0.46             +-----------------+-------------+----------+---------+  Wrist                0.37        0.58             +-----------------+-------------+----------+---------+ +-----------------+-------------+----------+--------+ Left Basilic     Diameter (cm)Depth (cm)Findings +-----------------+-------------+----------+--------+ Prox upper arm       0.54         0.17    origin  +-----------------+-------------+----------+--------+ Mid upper arm        0.76        0.98            +-----------------+-------------+----------+--------+ Dist upper arm       0.42        0.97            +-----------------+-------------+----------+--------+ Antecubital fossa    0.57        1.22            +-----------------+-------------+----------+--------+ Prox forearm         0.40        0.55            +-----------------+-------------+----------+--------+ Mid forearm          0.21        0.40            +-----------------+-------------+----------+--------+ Distal forearm       0.15        0.25            +-----------------+-------------+----------+--------+ Wrist                0.17        0.38            +-----------------+-------------+----------+--------+ *See table(s) above for measurements and observations.  Diagnosing physician:    Preliminary    DG Chest Port 1 View Result Date: 01/08/2024 CLINICAL DATA:  Shortness of breath EXAM: PORTABLE CHEST 1 VIEW COMPARISON:  Chest x-ray 01/05/2024 FINDINGS: The cardiac silhouette is enlarged, unchanged. Sternotomy wires are present. There is no focal lung infiltrate, pleural effusion or pneumothorax. There stable elevation of the left hemidiaphragm. No acute fractures are seen. IMPRESSION: 1. No active disease. 2. Stable cardiomegaly. Electronically Signed   By: Greig Pique M.D.   On: 01/08/2024 19:12    chest X-ray  EKG: Independently reviewed. LVH, sinus  I/O last 3 completed shifts: In: 120 [P.O.:120] Out: -  Total I/O In: -  Out: 440 [Urine:440]     Assessment and Plan: * Acute renal failure superimposed on stage 5 chronic kidney disease, not on chronic dialysis [facility]) Appreciate nephrology evaluation.  Patient is felt to have uremia given his presentation of nausea and vomiting that is actually going on for at least a month.  But worse over the last 1 week.  With associated  fluid overload.  I will continue with torsemide .  Plan is to start the patient on dialysis as mentioned in HPI.  I will check a bladder scan to make sure were not dealing with any urinary retention.  And we will check a troponin to rule out ACS.  Patient noted to be Slifer slightly hypophosphatemic.  Started on PhosLo . C.w jardiance  for now  Asthma, chronic Not in exacerbation, continue with Singulair   Nausea and vomiting Exacerbated likely due to uremia or uremia.  Continue with chronic famotidine  as well as pantoprazole  Zofran  ordered  Gout Chronic, continue with allopurinol   Atrial flutter with rapid ventricular response (HCC) Chronically documented.  Since the plan for the patient is to  get dialysis initiated, I am holding patient's apixaban .  CAD (coronary artery disease) Chronically documented in the chart, continue with Repatha .  Beta-blockers held.  Hypertension This is chronically documented, patient's blood pressure is actually pretty soft right now and heart rate less than 60.  Therefore I am holding patient diltiazem , metoprolol , losartan .  Will use agents as needed.   Please review medication reconciliation after pharmacy input.   Advance Care Planning:   Code Status: Full Code   Consults: renal, Vascular surgery.  Family Communication: per pateitn he has been in touch with family  Severity of Illness: The appropriate patient status for this patient is INPATIENT. Inpatient status is judged to be reasonable and necessary in order to provide the required intensity of service to ensure the patient's safety. The patient's presenting symptoms, physical exam findings, and initial radiographic and laboratory data in the context of their chronic comorbidities is felt to place them at high risk for further clinical deterioration. Furthermore, it is not anticipated that the patient will be medically stable for discharge from the hospital within 2 midnights of admission.   * I  certify that at the point of admission it is my clinical judgment that the patient will require inpatient hospital care spanning beyond 2 midnights from the point of admission due to high intensity of service, high risk for further deterioration and high frequency of surveillance required.*  Author: Jacqulyn Divine, MD 01/09/2024 10:07 AM  For on call review [email or web address].

## 2024-01-09 NOTE — Progress Notes (Signed)
 Dr. Brock Canner aware of patient arrival.

## 2024-01-09 NOTE — Progress Notes (Signed)
 Patient arrived via stretcher with [facility] staff. Alert and orin. With no complaints. Orin. To room and call bell. Tim RN into assess patient.

## 2024-01-09 NOTE — Assessment & Plan Note (Signed)
 Chronic, continue with allopurinol 

## 2024-01-09 NOTE — Consult Note (Signed)
 Hospital Consult    Reason for Consult: End-stage renal disease needing dialysis Requesting Physician: Dr. Patel-nephrology MRN #:  [id number]  History of Present Illness: This is a 53 y.o. male with history outlined below.  This includes CAD with CABG in 2017, CKD that is now progressed to end-stage renal disease.  He is on Eliquis  for paroxysmal A-fib.  Patient presented to the ED with acute on chronic kidney disease.  He had weight gain, increased swelling, nausea and vomiting for a few days.  He was seen by nephrology, he felt like he had progressed to end-stage renal disease.  Vascular surgery was consulted for permanent HD access.  On exam, Sean [name] was doing well.  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  His runs mainly included [address] [place] , [place] , [place]  runs.  Denies upper extremity surgery.  Past Medical History:  Diagnosis Date   Abnormal liver enzymes    a. Sees a doctor in [place].   Cardiac cirrhosis    a. possible elevated LFTs/low platelets felt due to cardiac cirrhosis per 2017 admission.   Chronic combined systolic and diastolic CHF (congestive heart failure) (HCC)    CKD (chronic kidney disease) stage 3, GFR 30-59 ml/min (HCC) 01/04/2016   Lab Results  Component  Value  Date/Time     GFR  29.24 (L)  09/30/2022 02:51 PM     GFR  27.27 (L)  09/24/2022 03:40 PM     GFR  24.10 (L)  09/03/2022 03:45 PM     GFR  18.98 (L)  08/25/2022 12:21 PM     GFR  38.98 (L)  04/24/2022 02:39 PM         CKD (chronic kidney disease), stage II    Stage 4   Congenital heart defect    a. rightward rotation of heart, almost dextrocardia   Coronary artery disease    a. s/p CABGx1 in 12/2015.   Gastric polyps 11/25/2022   Gastritis and gastroduodenitis 11/25/2022          Gastroesophageal reflux disease with esophagitis and  hemorrhage 04/04/2021   Hematuria    a. Chronic hx of this, no prior etiology determined through workup per patient.   History of umbilical hernia repair 07/10/2011   History of hernia surgery twice prior   Hypercholesteremia    a. Prev taken off statin due to abnormal liver function.   Hypertension    Incisional hernia 07/10/2011   History of hernia surgery twice prior   Morbid obesity (HCC)    Morbid obesity with BMI of 50.0-59.9, adult (HCC) 01/04/2016   After [facility] doctor is setting him up with a wellness clinic to help with weight losS  He reports not having tried anything for weight loss either diet or or medicine or surgery in the past  We failed to get Wegovy  10/2022      Wt Readings from Last 10 Encounters:  11/13/22  (!) 384 lb 12.8 oz (174.5 kg)  10/13/22  (!) 385 lb (174.6 kg)  09/30/22  (!) 384 lb (174.2 kg)  09/24/22  (!) 382 lb (1   Nausea and vomiting 11/25/2022   OSA on CPAP    Paroxysmal atrial flutter (HCC) 02/12/2016   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   LIMA to LAD   Sinus bradycardia  Thrombocytopenia [facility])     Past Surgical History:  Procedure Laterality Date   APPENDECTOMY     BIOPSY  02/19/2021   Procedure: BIOPSY;  Surgeon: Eda Iha, MD;  Location: [facility] ENDOSCOPY;  Service: Gastroenterology;;   BIOPSY  11/25/2022   Procedure: BIOPSY;  Surgeon: Eda Iha, MD;  Location: [facility] ENDOSCOPY;  Service: Gastroenterology;;   CARDIAC CATHETERIZATION N/A 12/24/2015   Procedure: Right/Left Heart Cath and Coronary Angiography;  Surgeon: Toribio JONELLE Fuel, MD;  Location: [facility] INVASIVE CV LAB;  Service: Cardiovascular;  Laterality: N/A;   CARDIOVERSION N/A 02/14/2016   Procedure: CARDIOVERSION;  Surgeon: Vinie JAYSON Maxcy, MD;  Location: [facility] ENDOSCOPY;  Service: Cardiovascular;  Laterality: N/A;   COLONOSCOPY WITH PROPOFOL  N/A 02/19/2021   Procedure: COLONOSCOPY WITH PROPOFOL ;  Surgeon: Eda Iha, MD;  Location: [facility] ENDOSCOPY;  Service: Gastroenterology;   Laterality: N/A;   CORONARY ARTERY BYPASS GRAFT N/A 12/26/2015   Procedure: Off Pump Coronary Artery Bypass Grafting times one using left internal mammary artery;  Surgeon: Sudie VEAR Laine, MD;  Location: [facility] OR;  Service: Open Heart Surgery;  Laterality: N/A;   ESOPHAGOGASTRODUODENOSCOPY (EGD) WITH PROPOFOL  N/A 02/19/2021   Procedure: ESOPHAGOGASTRODUODENOSCOPY (EGD) WITH PROPOFOL ;  Surgeon: Eda Iha, MD;  Location: [facility] ENDOSCOPY;  Service: Gastroenterology;  Laterality: N/A;   ESOPHAGOGASTRODUODENOSCOPY (EGD) WITH PROPOFOL  N/A 11/25/2022   Procedure: ESOPHAGOGASTRODUODENOSCOPY (EGD) WITH PROPOFOL ;  Surgeon: Eda Iha, MD;  Location: [facility] ENDOSCOPY;  Service: Gastroenterology;  Laterality: N/A;   GALLBLADDER SURGERY     HERNIA REPAIR     IR TRANSCATHETER BX  06/26/2023   IR US  GUIDE VASC ACCESS RIGHT  06/26/2023   IR VENOGRAM HEPATIC W HEMODYNAMIC EVALUATION  06/26/2023   LEFT HEART CATH AND CORS/GRAFTS ANGIOGRAPHY N/A 04/15/2017   Procedure: Left Heart Cath and Cors/Grafts Angiography;  Surgeon: Burnard Debby LABOR, MD;  Location: [facility] INVASIVE CV LAB;  Service: Cardiovascular;  Laterality: N/A;   LEFT HEART CATH AND CORS/GRAFTS ANGIOGRAPHY N/A 11/02/2020   Procedure: LEFT HEART CATH AND CORS/GRAFTS ANGIOGRAPHY;  Surgeon: Wonda Sharper, MD;  Location: [facility] INVASIVE CV LAB;  Service: Cardiovascular;  Laterality: N/A;   POLYPECTOMY  02/19/2021   Procedure: POLYPECTOMY;  Surgeon: Eda Iha, MD;  Location: [facility] ENDOSCOPY;  Service: Gastroenterology;;   TEE WITHOUT CARDIOVERSION N/A 12/26/2015   Procedure: TRANSESOPHAGEAL ECHOCARDIOGRAM (TEE);  Surgeon: Sudie VEAR Laine, MD;  Location: [facility] OR;  Service: Open Heart Surgery;  Laterality: N/A;   TEE WITHOUT CARDIOVERSION N/A 02/14/2016   Procedure: TRANSESOPHAGEAL ECHOCARDIOGRAM (TEE);  Surgeon: Vinie JAYSON Maxcy, MD;  Location: [facility] ENDOSCOPY;  Service: Cardiovascular;  Laterality: N/A;    Allergies  Allergen Reactions   Vibra -Tab [Doxycycline ]  Shortness Of Breath   Glucophage  [Metformin ] Other (See Comments)    Pt told by his nephrologist not to take this medication due to his kidney function.   Imdur  [Isosorbide  Dinitrate] Diarrhea   Valtrex  [Valacyclovir ] Itching   Augmentin [Amoxicillin-Pot Clavulanate] Itching   Tape Rash    Prior to Admission medications   Medication Sig Start Date End Date Taking? Authorizing Provider  acetaminophen  (TYLENOL ) 500 MG tablet Take 1,500 mg by mouth once as needed for moderate pain (pain score 4-6), fever, headache or mild pain (pain score 1-3).    [provider]  allopurinol  (ZYLOPRIM ) 100 MG tablet Take 1 tablet (100 mg total) by mouth daily. 11/11/23   Dennise Lavada POUR, MD  amitriptyline  (ELAVIL ) 25 MG tablet Take 1 tablet (25 mg total) by mouth at bedtime. Patient taking differently: Take 25 mg by  mouth at bedtime. 09/14/23   Kennedy-Smith, Colleen M, NP  apixaban  (ELIQUIS ) 5 MG TABS tablet Take 1 tablet (5 mg total) by mouth 2 (two) times daily. 07/28/23   Chandrasekhar, Stanly LABOR, MD  azelastine  (ASTELIN ) 0.1 % nasal spray Place 2 sprays into both nostrils 2 (two) times daily. Use in each nostril as directed Patient taking differently: Place 2 sprays into both nostrils as needed for allergies. Use in each nostril as directed 12/01/23   Tobie Eldora NOVAK, MD  Cholecalciferol (VITAMIN D3) 125 MCG (5000 UT) CAPS Take 1 capsule (5,000 Units total) by mouth daily at 12 noon. 07/05/23   Lucius Krabbe, NP  Cyanocobalamin  (VITAMIN B-12 PO) Place 1 tablet under the tongue daily.    [provider]  diltiazem  (CARDIZEM  SR) 120 MG 12 hr capsule Take 1 capsule (120 mg total) by mouth 2 (two) times daily. 08/04/23   Jesus Bernardino MATSU, MD  docusate sodium  (COLACE) 100 MG capsule Take 2 capsules (200 mg total) by mouth 2 (two) times daily. Patient taking differently: Take 200 mg by mouth as needed for moderate constipation. 11/04/23   Singh, Prashant K, MD  Evolocumab  (REPATHA  SURECLICK) 140  MG/ML SOAJ INJECT THE CONTENTS OF 1 SYRINGE UNDER THE SKIN EVERY 14 DAYS 11/30/23   Santo Stanly LABOR, MD  famotidine  (PEPCID ) 20 MG tablet Take 1 tablet (20 mg total) by mouth 2 (two) times daily. 10/28/23   Kennedy-Smith, Colleen M, NP  famotidine -calcium  carbonate-magnesium  hydroxide (PEPCID  COMPLETE) 10-800-165 MG chewable tablet Chew 1 tablet by mouth 2 (two) times daily as needed. 11/09/23   Jesus Bernardino MATSU, MD  fluticasone  (FLONASE ) 50 MCG/ACT nasal spray Place 2 sprays into both nostrils daily. 12/01/23   Tobie Eldora NOVAK, MD  hydrocortisone  (ANUSOL -HC) 2.5 % rectal cream Apply rectally 2 (two) times daily. 11/04/23   Singh, Prashant K, MD  JARDIANCE  10 MG TABS tablet TAKE 1 TABLET(10 MG) BY MOUTH DAILY BEFORE BREAKFAST Patient taking differently: Take 10 mg by mouth daily. 10/13/23   Jesus Bernardino MATSU, MD  ketoconazole  (NIZORAL ) 2 % cream Apply 1 Application topically 2 (two) times daily. 12/14/23   Jesus Bernardino MATSU, MD  losartan  (COZAAR ) 50 MG tablet Take 1 tablet (50 mg total) by mouth daily. 12/03/23   Jesus Bernardino MATSU, MD  metoprolol  tartrate (LOPRESSOR ) 25 MG tablet Take 1 tablet (25 mg total) by mouth 2 (two) times daily. 01/01/24   Chandrasekhar, Stanly LABOR, MD  montelukast  (SINGULAIR ) 10 MG tablet TAKE 1 TABLET(10 MG) BY MOUTH DAILY Patient taking differently: Take 10 mg by mouth daily. 09/04/23   Jesus Bernardino MATSU, MD  nitroGLYCERIN  (NITROSTAT ) 0.4 MG SL tablet Place 1 tablet (0.4 mg total) under the tongue every 5 (five) minutes as needed for chest pain. 02/10/22   Chandrasekhar, Stanly LABOR, MD  ondansetron  (ZOFRAN -ODT) 4 MG disintegrating tablet Take 1 tablet (4 mg total) by mouth every 8 (eight) hours as needed for nausea or vomiting. 12/16/23   Jesus Bernardino MATSU, MD  pantoprazole  (PROTONIX ) 40 MG tablet TAKE 1 TABLET(40 MG) BY MOUTH TWICE DAILY 12/23/23   Jesus Bernardino MATSU, MD  polyethylene glycol (MIRALAX  / GLYCOLAX ) 17 g packet Mix 17 grams (1 packet) in 8 ounces of fluid and take by mouth  daily. 11/05/23   Singh, Prashant K, MD  sucralfate  (CARAFATE ) 1 g tablet Take 1 tablet (1 g total) by mouth at bedtime. 01/01/24   Chandrasekhar, Stanly LABOR, MD  torsemide  (DEMADEX ) 10 MG tablet Take 1 tablet (10 mg total) by  mouth daily. 11/05/23   Singh, Prashant K, MD  triamcinolone  (NASACORT ) 55 MCG/ACT AERO nasal inhaler Place 1 spray into the nose daily. Start with 1 spray each side twice a day for 3 days, then reduce to daily. 10/08/23   Lucius Krabbe, NP    Social History   Socioeconomic History   Marital status: Divorced    Spouse name: Not on file   Number of children: Not on file   Years of education: Not on file   Highest education level: Not on file  Occupational History   Occupation: Truck Hospital Doctor  Tobacco Use   Smoking status: Never   Smokeless tobacco: Never  Vaping Use   Vaping status: Never Used  Substance and Sexual Activity   Alcohol use: Yes    Comment: 6 beers per month   Drug use: No   Sexual activity: Not Currently  Other Topics Concern   Not on file  Social History Narrative   Truck driver -- regional   Lives with mom and dad   Social Drivers of Health   Financial Resource Strain: Not on file  Food Insecurity: No Food Insecurity (11/05/2023)   Hunger Vital Sign    Worried About Running Out of Food in the Last Year: Never true    Ran Out of Food in the Last Year: Never true  Transportation Needs: No Transportation Needs (11/05/2023)   PRAPARE - Administrator, Civil Service (Medical): No    Lack of Transportation (Non-Medical): No  Physical Activity: Not on file  Stress: Not on file  Social Connections: Unknown (08/26/2022)   [facility]   Social Network    Social Network: Not on file  Intimate Partner Violence: Not At Risk (11/05/2023)   Humiliation, Afraid, Rape, and Kick questionnaire    Fear of Current or Ex-Partner: No    Emotionally Abused: No    Physically Abused: No    Sexually Abused: No    Family History  Problem Relation Age of Onset   CAD Father        4 stents ~ 68   Diabetes Father    Diabetes Maternal Grandfather    Diabetes Maternal Uncle    Heart attack Other    Hyperlipidemia Other     ROS: Otherwise negative unless mentioned in HPI  Physical Examination  Vitals:   01/09/24 0418 01/09/24 0808  BP: 137/65 138/67  Pulse: (!) 58 (!) 57  Resp: 20 20  Temp: 97.6 F (36.4 C) 97.9 F (36.6 C)  SpO2: 97% 98%   Body mass index is 54.82 kg/m.  General:  WDWN in NAD, obese Gait: Not observed HENT: WNL, normocephalic Pulmonary: normal non-labored breathing, without Rales, rhonchi,  wheezing Cardiac: regular Abdomen: soft, NT/ND, no masses Skin: without rashes Vascular Exam/Pulses: 2+ radial pulses Extremities: without ischemic changes, without Gangrene , without cellulitis; without open wounds;  Musculoskeletal: no muscle wasting or atrophy  Neurologic: A&O X 3;  No focal weakness or paresthesias are detected; speech is fluent/normal Psychiatric:  The pt has Normal affect. Lymph:  Unremarkable  CBC    Component Value Date/Time   WBC 6.2 01/09/2024 0639   RBC 2.98 (L) 01/09/2024 0639   HGB 10.1 (L) 01/09/2024 0639   HGB 14.4 10/31/2020 0958   HCT 30.5 (L) 01/09/2024 0639   HCT 42.5 10/31/2020 0958   PLT 64 (L) 01/09/2024 0639   PLT 55 (LL) 05/04/2023 1119   MCV 102.3 (H) 01/09/2024 9360  MCV 99 (A) 03/24/2022 0000   MCH 33.9 01/09/2024 0639   MCHC 33.1 01/09/2024 0639   RDW 14.5 01/09/2024 0639   RDW 13.6 10/31/2020 0958   LYMPHSABS 1.6 01/09/2024 0639   LYMPHSABS 1.4 09/14/2019 1350   MONOABS 1.0 01/09/2024 0639   EOSABS 0.6 (H) 01/09/2024 0639   EOSABS 0.2 09/14/2019 1350   BASOSABS 0.1 01/09/2024 0639   BASOSABS 0.0 09/14/2019 1350    BMET    Component Value Date/Time   NA 144 01/09/2024 0639   NA 140 07/27/2023 0000   K 3.7 01/09/2024 0639   CL 110 01/09/2024 0639   CO2 22 01/09/2024 0639   GLUCOSE 131 (H) 01/09/2024 0639    BUN 65 (H) 01/09/2024 0639   BUN 45 (A) 07/27/2023 0000   CREATININE 7.44 (H) 01/09/2024 0639   CREATININE 3.02 (H) 04/03/2023 1609   CREATININE 1.95 (H) 12/13/2021 1808   CALCIUM  8.9 01/09/2024 0639   CALCIUM  9.1 12/23/2023 0000   GFRNONAA 8 (L) 01/09/2024 0639   GFRNONAA 24 (L) 04/03/2023 1609   GFRAA 57 (L) 10/31/2020 0958    COAGS: Lab Results  Component Value Date   INR 1.1 06/26/2023   INR 1.1 06/17/2023   INR 1.00 04/10/2023   PROTIME 10.3 04/10/2023     Non-Invasive Vascular Imaging:   Pending     ASSESSMENT/PLAN: This is a 53 y.o. male with acute on chronic kidney disease now progressed end-stage renal disease requiring long-term HD access.  I had a long conversation with Sean [name] regarding the above.  We discussed that for hemodialysis he would require tunneled HD line, as well as fistula.  He is right-handed, therefore I will try to place the fistula in the left upper extremity if he has adequate vein.  We discussed the risks and benefits of both tunneled hemodialysis catheter placement as well as fistula creation.  After discussing the risks and benefits of both, he elected to proceed.  Vein mapping is pending. Will plan for Monday. Please make n.p.o. Sunday night.    Fonda FORBES Rim MD MS [facility] [phone] 01/09/2024  8:10 AM

## 2024-01-09 NOTE — Assessment & Plan Note (Signed)
 This is chronically documented, patient's blood pressure is actually pretty soft right now and heart rate less than 60.  Therefore I am holding patient diltiazem , metoprolol , losartan .  Will use agents as needed.

## 2024-01-09 NOTE — Assessment & Plan Note (Addendum)
 Appreciate nephrology evaluation.  Patient is felt to have uremia given his presentation of nausea and vomiting that is actually going on for at least a month.  But worse over the last 1 week.  With associated fluid overload.  I will continue with torsemide .  Plan is to start the patient on dialysis as mentioned in HPI.  I will check a bladder scan to make sure were not dealing with any urinary retention.  And we will check a troponin to rule out ACS.  Patient noted to be Slifer slightly hypophosphatemic.  Started on PhosLo . C.w jardiance  for now

## 2024-01-09 NOTE — Assessment & Plan Note (Signed)
 Exacerbated likely due to uremia or uremia.  Continue with chronic famotidine  as well as pantoprazole  Zofran  ordered

## 2024-01-09 NOTE — Progress Notes (Signed)
 VASCULAR LAB    Upper extremity vein mapping has been performed.  See CV proc for preliminary results.   Kloie Whiting, RVT 01/09/2024, 9:46 AM

## 2024-01-09 NOTE — Consult Note (Signed)
 Hospital Consult    Reason for Consult:  dialysis access Requesting Physician:  Dr.Patel MRN #:  [id number]  History of Present Illness: Sean [name] ([place]) is a 53 y.o. male with a past medical history of CHF with preserved ejection fraction, paroxysmal atrial fibrillation on Eliquis , CAD s/p CABG, morbid obesity with OSA, nonalcoholic steatohepatitis with thrombocytopenia, and CKD.  He was admitted to the hospital yesterday with worsening nausea and vomiting and decreased oral intake.  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  He denies any fevers.  He denies any chest pain.  We were consulted for dialysis access.  Upon evaluation of the patient he is in good spirits.  He denies any pain currently.  He says his uremia symptoms started about a week ago and worsened with time.  He did go to the ED earlier this week for his symptoms, however he left after waiting almost 12 hours.  He does take Eliquis  for his paroxysmal atrial fibrillation.  His last dose of Eliquis  was last night.  Past Medical History:  Diagnosis Date   Abnormal liver enzymes    a. Sees a doctor in [place].   Cardiac cirrhosis    a. possible elevated LFTs/low platelets felt due to cardiac cirrhosis per 2017 admission.   Chronic combined systolic and diastolic CHF (congestive heart failure) (HCC)    CKD (chronic kidney disease) stage 3, GFR 30-59 ml/min (HCC) 01/04/2016   Lab Results  Component  Value  Date/Time     GFR  29.24 (L)  09/30/2022 02:51 PM     GFR  27.27 (L)  09/24/2022 03:40 PM     GFR  24.10 (L)  09/03/2022 03:45 PM     GFR  18.98 (L)  08/25/2022 12:21 PM     GFR  38.98 (L)  04/24/2022 02:39 PM         CKD (chronic kidney disease), stage II    Stage 4   Congenital heart defect    a. rightward rotation of heart, almost dextrocardia   Coronary artery disease    a. s/p CABGx1 in 12/2015.   Gastric polyps 11/25/2022   Gastritis and gastroduodenitis 11/25/2022           Gastroesophageal reflux disease with esophagitis and hemorrhage 04/04/2021   Hematuria    a. Chronic hx of this, no prior etiology determined through workup per patient.   History of umbilical hernia repair 07/10/2011   History of hernia surgery twice prior   Hypercholesteremia    a. Prev taken off statin due to abnormal liver function.   Hypertension    Incisional hernia 07/10/2011   History of hernia surgery twice prior   Morbid obesity (HCC)    Morbid obesity with BMI of 50.0-59.9, adult (HCC) 01/04/2016   After [place] [facility] doctor is setting him up with a wellness clinic to help with weight losS  He reports not having tried anything for weight loss either diet or or medicine or surgery in the past  We failed to get Wegovy  10/2022      Wt Readings from Last 10 Encounters:  11/13/22  (!) 384 lb 12.8 oz (174.5 kg)  10/13/22  (!) 385 lb (174.6 kg)  09/30/22  (!) 384 lb (174.2 kg)  09/24/22  (!) 382 lb (1   Nausea and vomiting 11/25/2022   OSA on CPAP    Paroxysmal atrial flutter (HCC) 02/12/2016   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  LIMA to LAD   Sinus bradycardia    Thrombocytopenia (HCC)     Past Surgical History:  Procedure Laterality Date   APPENDECTOMY     BIOPSY  02/19/2021   Procedure: BIOPSY;  Surgeon: Eda Iha, MD;  Location: [facility] ENDOSCOPY;  Service: Gastroenterology;;   BIOPSY  11/25/2022   Procedure: BIOPSY;  Surgeon: Eda Iha, MD;  Location: [facility];  Service: Gastroenterology;;   CARDIAC CATHETERIZATION N/A 12/24/2015   Procedure: Right/Left Heart Cath and Coronary Angiography;  Surgeon: Toribio JONELLE Fuel, MD;  Location: [facility] INVASIVE CV LAB;  Service: Cardiovascular;  Laterality: N/A;   CARDIOVERSION N/A 02/14/2016   Procedure: CARDIOVERSION;  Surgeon: Vinie JAYSON Maxcy, MD;  Location: [facility] ENDOSCOPY;  Service: Cardiovascular;  Laterality: N/A;   COLONOSCOPY WITH PROPOFOL  N/A 02/19/2021   Procedure: COLONOSCOPY WITH PROPOFOL ;  Surgeon: Eda Iha, MD;   Location: [facility]OS[facility];  Service: Gastroenterology;  Laterality: N/A;   CORONARY ARTERY BYPASS GRAFT N/A 12/26/2015   Procedure: Off Pump Coronary Artery Bypass Grafting times one using left internal mammary artery;  Surgeon: Sudie VEAR Laine, MD;  Location: [facility] OR;  Service: Open Heart Surgery;  Laterality: N/A;   ESOPHAGOGASTRODUODENOSCOPY (EGD) WITH PROPOFOL  N/A 02/19/2021   Procedure: ESOPHAGOGASTRODUODENOSCOPY (EGD) WITH PROPOFOL ;  Surgeon: Eda Iha, MD;  Location: [facility]OS[facility];  Service: Gastroenterology;  Laterality: N/A;   ESOPHAGOGASTRODUODENOSCOPY (EGD) WITH PROPOFOL  N/A 11/25/2022   Procedure: ESOPHAGOGASTRODUODENOSCOPY (EGD) WITH PROPOFOL ;  Surgeon: Eda Iha, MD;  Location: [facility] ENDOSCOPY;  Service: Gastroenterology;  Laterality: N/A;   GALLBLADDER SURGERY     HERNIA REPAIR     IR TRANSCATHETER BX  06/26/2023   IR US  GUIDE VASC ACCESS RIGHT  06/26/2023   IR VENOGRAM HEPATIC W HEMODYNAMIC EVALUATION  06/26/2023   LEFT HEART CATH AND CORS/GRAFTS ANGIOGRAPHY N/A 04/15/2017   Procedure: Left Heart Cath and Cors/Grafts Angiography;  Surgeon: Burnard Debby LABOR, MD;  Location: [facility] INVASIVE CV LAB;  Service: Cardiovascular;  Laterality: N/A;   LEFT HEART CATH AND CORS/GRAFTS ANGIOGRAPHY N/A 11/02/2020   Procedure: LEFT HEART CATH AND CORS/GRAFTS ANGIOGRAPHY;  Surgeon: Wonda Sharper, MD;  Location: [facility] INVASIVE CV LAB;  Service: Cardiovascular;  Laterality: N/A;   POLYPECTOMY  02/19/2021   Procedure: POLYPECTOMY;  Surgeon: Eda Iha, MD;  Location: [facility] ENDOSCOPY;  Service: Gastroenterology;;   TEE WITHOUT CARDIOVERSION N/A 12/26/2015   Procedure: TRANSESOPHAGEAL ECHOCARDIOGRAM (TEE);  Surgeon: Sudie VEAR Laine, MD;  Location[facility] OR;  Service: Open Heart Surgery;  Laterality: N/A;   TEE WITHOUT CARDIOVERSION N/A 02/14/2016   Procedure: TRANSESOPHAGEAL ECHOCARDIOGRAM (TEE);  Surgeon: Vinie JAYSON Maxcy, MD;  Location: [facility] ENDOSCOPY;  Service: Cardiovascular;  Laterality: N/A;     Allergies  Allergen Reactions   Vibra -Tab [Doxycycline ] Shortness Of Breath   Glucophage  [Metformin ] Other (See Comments)    Pt told by his nephrologist not to take this medication due to his kidney function.   Imdur  [Isosorbide  Dinitrate] Diarrhea   Valtrex  [Valacyclovir ] Itching   Augmentin [Amoxicillin-Pot Clavulanate] Itching   Tape Rash    Prior to Admission medications   Medication Sig Start Date End Date Taking? Authorizing Provider  acetaminophen  (TYLENOL ) 500 MG tablet Take 1,500 mg by mouth once as needed for moderate pain (pain score 4-6), fever, headache or mild pain (pain score 1-3).    [provider]  allopurinol  (ZYLOPRIM ) 100 MG tablet Take 1 tablet (100 mg total) by mouth daily. 11/11/23   Singh, Prashant K, MD  amitriptyline  (ELAVIL ) 25 MG tablet Take 1 tablet (25 mg total) by  mouth at bedtime. Patient taking differently: Take 25 mg by mouth at bedtime. 09/14/23   Kennedy-Smith, Colleen M, NP  apixaban  (ELIQUIS ) 5 MG TABS tablet Take 1 tablet (5 mg total) by mouth 2 (two) times daily. 07/28/23   Chandrasekhar, Stanly LABOR, MD  azelastine  (ASTELIN ) 0.1 % nasal spray Place 2 sprays into both nostrils 2 (two) times daily. Use in each nostril as directed Patient taking differently: Place 2 sprays into both nostrils as needed for allergies. Use in each nostril as directed 12/01/23   Tobie Eldora NOVAK, MD  Cholecalciferol (VITAMIN D3) 125 MCG (5000 UT) CAPS Take 1 capsule (5,000 Units total) by mouth daily at 12 noon. 07/05/23   Lucius Krabbe, NP  Cyanocobalamin  (VITAMIN B-12 PO) Place 1 tablet under the tongue daily.    [provider]  diltiazem  (CARDIZEM  SR) 120 MG 12 hr capsule Take 1 capsule (120 mg total) by mouth 2 (two) times daily. 08/04/23   Jesus Bernardino MATSU, MD  docusate sodium  (COLACE) 100 MG capsule Take 2 capsules (200 mg total) by mouth 2 (two) times daily. Patient taking differently: Take 200 mg by mouth as needed for moderate constipation.  11/04/23   Singh, Prashant K, MD  Evolocumab  (REPATHA  SURECLICK) 140 MG/ML SOAJ INJECT THE CONTENTS OF 1 SYRINGE UNDER THE SKIN EVERY 14 DAYS 11/30/23   Santo Stanly A, MD  famotidine  (PEPCID ) 20 MG tablet Take 1 tablet (20 mg total) by mouth 2 (two) times daily. 10/28/23   Kennedy-Smith, Colleen M, NP  famotidine -calcium  carbonate-magnesium  hydroxide (PEPCID  COMPLETE) 10-800-165 MG chewable tablet Chew 1 tablet by mouth 2 (two) times daily as needed. 11/09/23   Jesus Bernardino MATSU, MD  fluticasone  (FLONASE ) 50 MCG/ACT nasal spray Place 2 sprays into both nostrils daily. 12/01/23   Tobie Eldora NOVAK, MD  hydrocortisone  (ANUSOL -HC) 2.5 % rectal cream Apply rectally 2 (two) times daily. 11/04/23   Singh, Prashant K, MD  JARDIANCE  10 MG TABS tablet TAKE 1 TABLET(10 MG) BY MOUTH DAILY BEFORE BREAKFAST Patient taking differently: Take 10 mg by mouth daily. 10/13/23   Jesus Bernardino MATSU, MD  ketoconazole  (NIZORAL ) 2 % cream Apply 1 Application topically 2 (two) times daily. 12/14/23   Jesus Bernardino MATSU, MD  losartan  (COZAAR ) 50 MG tablet Take 1 tablet (50 mg total) by mouth daily. 12/03/23   Jesus Bernardino MATSU, MD  metoprolol  tartrate (LOPRESSOR ) 25 MG tablet Take 1 tablet (25 mg total) by mouth 2 (two) times daily. 01/01/24   Chandrasekhar, Stanly LABOR, MD  montelukast  (SINGULAIR ) 10 MG tablet TAKE 1 TABLET(10 MG) BY MOUTH DAILY Patient taking differently: Take 10 mg by mouth daily. 09/04/23   Jesus Bernardino MATSU, MD  nitroGLYCERIN  (NITROSTAT ) 0.4 MG SL tablet Place 1 tablet (0.4 mg total) under the tongue every 5 (five) minutes as needed for chest pain. 02/10/22   Chandrasekhar, Stanly LABOR, MD  ondansetron  (ZOFRAN -ODT) 4 MG disintegrating tablet Take 1 tablet (4 mg total) by mouth every 8 (eight) hours as needed for nausea or vomiting. 12/16/23   Jesus Bernardino MATSU, MD  pantoprazole  (PROTONIX ) 40 MG tablet TAKE 1 TABLET(40 MG) BY MOUTH TWICE DAILY 12/23/23   Jesus Bernardino MATSU, MD  polyethylene glycol (MIRALAX  / GLYCOLAX ) 17 g  packet Mix 17 grams (1 packet) in 8 ounces of fluid and take by mouth daily. 11/05/23   Singh, Prashant K, MD  sucralfate  (CARAFATE ) 1 g tablet Take 1 tablet (1 g total) by mouth at bedtime. 01/01/24   Santo Stanly LABOR, MD  torsemide  (DEMADEX )  10 MG tablet Take 1 tablet (10 mg total) by mouth daily. 11/05/23   Singh, Prashant K, MD  triamcinolone  (NASACORT ) 55 MCG/ACT AERO nasal inhaler Place 1 spray into the nose daily. Start with 1 spray each side twice a day for 3 days, then reduce to daily. 10/08/23   Lucius Krabbe, NP    Social History   Socioeconomic History   Marital status: Divorced    Spouse name: Not on file   Number of children: Not on file   Years of education: Not on file   Highest education level: Not on file  Occupational History   Occupation: Truck Hospital Doctor  Tobacco Use   Smoking status: Never   Smokeless tobacco: Never  Vaping Use   Vaping status: Never Used  Substance and Sexual Activity   Alcohol use: Yes    Comment: 6 beers per month   Drug use: No   Sexual activity: Not Currently  Other Topics Concern   Not on file  Social History Narrative   Truck driver -- regional   Lives with mom and dad   Social Drivers of Health   Financial Resource Strain: Not on file  Food Insecurity: No Food Insecurity (11/05/2023)   Hunger Vital Sign    Worried About Running Out of Food in the Last Year: Never true    Ran Out of Food in the Last Year: Never true  Transportation Needs: No Transportation Needs (11/05/2023)   PRAPARE - Administrator, Civil Service (Medical): No    Lack of Transportation (Non-Medical): No  Physical Activity: Not on file  Stress: Not on file  Social Connections: Unknown (08/26/2022)   [facility]   Social Network    Social Network: Not on file  Intimate Partner Violence: Not At Risk (11/05/2023)   Humiliation, Afraid, Rape, and Kick questionnaire    Fear of Current or Ex-Partner: No     Emotionally Abused: No    Physically Abused: No    Sexually Abused: No     Family History  Problem Relation Age of Onset   CAD Father        4 stents ~ 39   Diabetes Father    Diabetes Maternal Grandfather    Diabetes Maternal Uncle    Heart attack Other    Hyperlipidemia Other     ROS: Otherwise negative unless mentioned in HPI  Physical Examination  Vitals:   01/09/24 0206 01/09/24 0418  BP: 129/61 137/65  Pulse: (!) 58 (!) 58  Resp: (!) 22 20  Temp: 98.1 F (36.7 C) 97.6 F (36.4 C)  SpO2: 97% 97%   Body mass index is 54.82 kg/m.  General:  morbidly obese male in NAD Gait: Not observed HENT: WNL, normocephalic Pulmonary: slightly increased work of breathing, no acute respiratory distress Cardiac: regular Abdomen:  soft, NT/ND, no masses Skin: without rashes Vascular Exam/Pulses: palpable radial pulses bilaterally Extremities: without ischemic changes, without Gangrene , without cellulitis; without open wounds;  Musculoskeletal: no muscle wasting or atrophy  Neurologic: A&O X 3;  No focal weakness or paresthesias are detected; speech is fluent/normal Psychiatric:  The pt has Normal affect. Lymph:  Unremarkable  CBC    Component Value Date/Time   WBC 6.2 01/09/2024 0639   RBC 2.98 (L) 01/09/2024 0639   HGB 10.1 (L) 01/09/2024 0639   HGB 14.4 10/31/2020 0958   HCT 30.5 (L) 01/09/2024 0639   HCT 42.5 10/31/2020 0958   PLT 64 (L)  01/09/2024 0639   PLT 55 (LL) 05/04/2023 1119   MCV 102.3 (H) 01/09/2024 0639   MCV 99 (A) 03/24/2022 0000   MCH 33.9 01/09/2024 0639   MCHC 33.1 01/09/2024 0639   RDW 14.5 01/09/2024 0639   RDW 13.6 10/31/2020 0958   LYMPHSABS 1.6 01/09/2024 0639   LYMPHSABS 1.4 09/14/2019 1350   MONOABS 1.0 01/09/2024 0639   EOSABS 0.6 (H) 01/09/2024 0639   EOSABS 0.2 09/14/2019 1350   BASOSABS 0.1 01/09/2024 0639   BASOSABS 0.0 09/14/2019 1350    BMET    Component Value Date/Time   NA 144 01/09/2024 0639   NA 140 07/27/2023 0000    K 3.7 01/09/2024 0639   CL 110 01/09/2024 0639   CO2 22 01/09/2024 0639   GLUCOSE 131 (H) 01/09/2024 0639   BUN 65 (H) 01/09/2024 0639   BUN 45 (A) 07/27/2023 0000   CREATININE 7.44 (H) 01/09/2024 0639   CREATININE 3.02 (H) 04/03/2023 1609   CREATININE 1.95 (H) 12/13/2021 1808   CALCIUM  8.9 01/09/2024 0639   CALCIUM  9.1 12/23/2023 0000   GFRNONAA 8 (L) 01/09/2024 0639   GFRNONAA 24 (L) 04/03/2023 1609   GFRAA 57 (L) 10/31/2020 0958    COAGS: Lab Results  Component Value Date   INR 1.1 06/26/2023   INR 1.1 06/17/2023   INR 1.00 04/10/2023   PROTIME 10.3 04/10/2023     Non-Invasive Vascular Imaging:   Vein Mapping Ordered   ASSESSMENT/PLAN: This is a 53 y.o. male with a history of CKD   -The patient has a history of CKD that has now progressed into ESRD with uremic symptoms.  He was admitted to the hospital yesterday with worsening nausea and vomiting and decreased oral intake for the past week.  We have been asked to create dialysis access for the patient -On exam the patient is fairly stable.  He does have some exertional shortness of breath -He has palpable radial pulses bilaterally.  He is right-hand dominant -I discussed with the patient that we can likely proceed with dialysis access on Monday.  This will include placement of a tunneled dialysis catheter and creation of permanent access in his nondominant arm.  I have explained to the patient that on his access may be a fistula or graft, depending if he has any healthy appearing veins in the left arm -Vein mapping studies have been ordered.  We can follow-up on the studies tomorrow with the patient -He is agreeable to access surgery.  Risk versus benefits discussed.  All questions answered at this time.  Please continue to hold Eliquis    [name] PA-C [facility] [phone]

## 2024-01-09 NOTE — Progress Notes (Signed)
 Have encourage patient all night to save urine. He insist on going to bathroom

## 2024-01-09 NOTE — Progress Notes (Signed)
(  Carryover admission to the Day Admitter; accepted by Dr.  Charlton as transfer from  [facility]  to a  pcu bed at  [facility]  for  aki superimposed on ckd stage 5. Please see Dr.  Jodeane transfer progress note for additional details).  I have placed some additional preliminary admit orders via the adult multi-morbid admission order set. I have also ordered morning labs in the form of CMP, CBC, magnesium  and phosphorus levels.    Eva Pore, DO Hospitalist

## 2024-01-09 NOTE — Consult Note (Signed)
 Reason for Consult: Chronic kidney disease stage V, uremic symptoms Referring Physician: Jacqulyn Divine, MD [facility])  HPI:  53 year old man with past medical history significant for congestive heart failure with preserved ejection fraction, paroxysmal atrial fibrillation on anticoagulation with Eliquis , coronary artery disease status post CABG, obesity with obstructive sleep apnea, hypothyroidism, nonalcoholic steatohepatitis with thrombocytopenia and progressive chronic kidney disease from multiple factors; most recently GFR ranging 13-14 mL/minute.  Over the past week, he has had progressively worsening nausea/vomiting and decreased oral intake along with dysgeusia.  Denies any diarrhea or fever but has had some intermittent chills.  He has had some exertional dyspnea that is unchanged with no chest pain.  He denies any orthostatic dizziness and has had stable pedal edema.  He attempted to be seen in the emergency room earlier this week for the above concerns but left after waiting about 11 hours in the emergency room.  His last dose of Eliquis  was yesterday night.  Past Medical History:  Diagnosis Date   Abnormal liver enzymes    a. Sees a doctor in [place].   Cardiac cirrhosis    a. possible elevated LFTs/low platelets felt due to cardiac cirrhosis per 2017 admission.   Chronic combined systolic and diastolic CHF (congestive heart failure) (HCC)    CKD (chronic kidney disease) stage 3, GFR 30-59 ml/min (HCC) 01/04/2016   Lab Results  Component  Value  Date/Time     GFR  29.24 (L)  09/30/2022 02:51 PM     GFR  27.27 (L)  09/24/2022 03:40 PM     GFR  24.10 (L)  09/03/2022 03:45 PM     GFR  18.98 (L)  08/25/2022 12:21 PM     GFR  38.98 (L)  04/24/2022 02:39 PM         CKD (chronic kidney disease), stage II    Stage 4   Congenital heart defect    a. rightward rotation of heart, almost dextrocardia   Coronary artery disease    a. s/p CABGx1 in 12/2015.   Gastric polyps 11/25/2022   Gastritis and  gastroduodenitis 11/25/2022          Gastroesophageal reflux disease with esophagitis and hemorrhage 04/04/2021   Hematuria    a. Chronic hx of this, no prior etiology determined through workup per patient.   History of umbilical hernia repair 07/10/2011   History of hernia surgery twice prior   Hypercholesteremia    a. Prev taken off statin due to abnormal liver function.   Hypertension    Incisional hernia 07/10/2011   History of hernia surgery twice prior   Morbid obesity (HCC)    Morbid obesity with BMI of 50.0-59.9, adult (HCC) 01/04/2016   After [facility] [facility] doctor is setting him up with a wellness clinic to help with weight losS  He reports not having tried anything for weight loss either diet or or medicine or surgery in the past  We failed to get Wegovy  10/2022      Wt Readings from Last 10 Encounters:  11/13/22  (!) 384 lb 12.8 oz (174.5 kg)  10/13/22  (!) 385 lb (174.6 kg)  09/30/22  (!) 384 lb (174.2 kg)  09/24/22  (!) 382 lb (1   Nausea and vomiting 11/25/2022   OSA on CPAP    Paroxysmal atrial flutter (HCC) 02/12/2016   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   LIMA to LAD   Sinus bradycardia    Thrombocytopenia (HCC)     Past Surgical History:  Procedure Laterality Date   APPENDECTOMY     BIOPSY  02/19/2021   Procedure: BIOPSY;  Surgeon: Eda Iha, MD;  Location: [facility] ENDOSCOPY;  Service: Gastroenterology;;   BIOPSY  11/25/2022   Procedure: BIOPSY;  Surgeon: Eda Iha, MD;  Location: [facility]OSCOPY;  Service: Gastroenterology;;   CARDIAC CATHETERIZATION N/A 12/24/2015   Procedure: Right/Left Heart Cath and Coronary Angiography;  Surgeon: Toribio JONELLE Fuel, MD;  Location: [facility] INVASIVE CV LAB;  Service: Cardiovascular;  Laterality: N/A;   CARDIOVERSION N/A 02/14/2016   Procedure: CARDIOVERSION;  Surgeon: Vinie JAYSON Maxcy, MD;  Location: [facility] ENDOSCOPY;  Service: Cardiovascular;  Laterality: N/A;   COLONOSCOPY WITH PROPOFOL  N/A 02/19/2021   Procedure: COLONOSCOPY WITH  PROPOFOL ;  Surgeon: Eda Iha, MD;  Location: [facility] ENDOSCOPY;  Service: Gastroenterology;  Laterality: N/A;   CORONARY ARTERY BYPASS GRAFT N/A 12/26/2015   Procedure: Off Pump Coronary Artery Bypass Grafting times one using left internal mammary artery;  Surgeon: Sudie VEAR Laine, MD;  Location: [facility] OR;  Service: Open Heart Surgery;  Laterality: N/A;   ESOPHAGOGASTRODUODENOSCOPY (EGD) WITH PROPOFOL  N/A 02/19/2021   Procedure: ESOPHAGOGASTRODUODENOSCOPY (EGD) WITH PROPOFOL ;  Surgeon: Eda Iha, MD;  Location: [facility] ENDOSCOPY;  Service: Gastroenterology;  Laterality: N/A;   ESOPHAGOGASTRODUODENOSCOPY (EGD) WITH PROPOFOL  N/A 11/25/2022   Procedure: ESOPHAGOGASTRODUODENOSCOPY (EGD) WITH PROPOFOL ;  Surgeon: Eda Iha, MD;  Location: [facility] ENDOSCOPY;  Service: Gastroenterology;  Laterality: N/A;   GALLBLADDER SURGERY     HERNIA REPAIR     IR TRANSCATHETER BX  06/26/2023   IR US  GUIDE VASC ACCESS RIGHT  06/26/2023   IR VENOGRAM HEPATIC W HEMODYNAMIC EVALUATION  06/26/2023   LEFT HEART CATH AND CORS/GRAFTS ANGIOGRAPHY N/A 04/15/2017   Procedure: Left Heart Cath and Cors/Grafts Angiography;  Surgeon: Burnard Debby LABOR, MD;  Location: [facility] INVASIVE CV LAB;  Service: Cardiovascular;  Laterality: N/A;   LEFT HEART CATH AND CORS/GRAFTS ANGIOGRAPHY N/A 11/02/2020   Procedure: LEFT HEART CATH AND CORS/GRAFTS ANGIOGRAPHY;  Surgeon: Wonda Sharper, MD;  Location: [facility] INVASIVE CV LAB;  Service: Cardiovascular;  Laterality: N/A;   POLYPECTOMY  02/19/2021   Procedure: POLYPECTOMY;  Surgeon: Eda Iha, MD;  Location: [facility] ENDOSCOPY;  Service: Gastroenterology;;   TEE WITHOUT CARDIOVERSION N/A 12/26/2015   Procedure: TRANSESOPHAGEAL ECHOCARDIOGRAM (TEE);  Surgeon: Sudie VEAR Laine, MD;  Location: [facility] OR;  Service: Open Heart Surgery;  Laterality: N/A;   TEE WITHOUT CARDIOVERSION N/A 02/14/2016   Procedure: TRANSESOPHAGEAL ECHOCARDIOGRAM (TEE);  Surgeon: Vinie JAYSON Maxcy, MD;  Location: [facility] ENDOSCOPY;  Service:  Cardiovascular;  Laterality: N/A;    Family History  Problem Relation Age of Onset   CAD Father        4 stents ~ 54   Diabetes Father    Diabetes Maternal Grandfather    Diabetes Maternal Uncle    Heart attack Other    Hyperlipidemia Other     Social History:  reports that he has never smoked. He has never used smokeless tobacco. He reports current alcohol use. He reports that he does not use drugs.  Allergies:  Allergies  Allergen Reactions   Vibra -Tab [Doxycycline ] Shortness Of Breath   Glucophage  [Metformin ] Other (See Comments)    Pt told by his nephrologist not to take this medication due to his kidney function.   Imdur  [Isosorbide  Dinitrate] Diarrhea   Valtrex  [Valacyclovir ] Itching   Augmentin [Amoxicillin-Pot Clavulanate] Itching   Tape Rash    Medications: I have reviewed the patient's current medications. Scheduled:  metoCLOPramide  (REGLAN ) injection  10 mg Intravenous Once  Latest Ref Rng & Units 01/09/2024    6:39 AM 01/08/2024    7:19 PM 01/05/2024   12:38 PM  BMP  Glucose 70 - 99 mg/dL 868  892  848   BUN 6 - 20 mg/dL 65  65  57   Creatinine 0.61 - 1.24 mg/dL 2.55  2.82  3.04   Sodium 135 - 145 mmol/L 144  145  142   Potassium 3.5 - 5.1 mmol/L 3.7  3.5  3.9   Chloride 98 - 111 mmol/L 110  110  107   CO2 22 - 32 mmol/L 22  25  24    Calcium  8.9 - 10.3 mg/dL 8.9  9.3  8.9       Latest Ref Rng & Units 01/09/2024    6:39 AM 01/08/2024    7:19 PM 01/05/2024   12:38 PM  CBC  WBC 4.0 - 10.5 K/uL 6.2  5.7  6.5   Hemoglobin 13.0 - 17.0 g/dL 89.8  88.7  89.1   Hematocrit 39.0 - 52.0 % 30.5  34.2  32.8   Platelets 150 - 400 K/uL 64  65  66     DG Chest Port 1 View Result Date: 01/08/2024 CLINICAL DATA:  Shortness of breath EXAM: PORTABLE CHEST 1 VIEW COMPARISON:  Chest x-ray 01/05/2024 FINDINGS: The cardiac silhouette is enlarged, unchanged. Sternotomy wires are present. There is no focal lung infiltrate, pleural effusion or pneumothorax. There stable elevation  of the left hemidiaphragm. No acute fractures are seen. IMPRESSION: 1. No active disease. 2. Stable cardiomegaly. Electronically Signed   By: Greig Pique M.D.   On: 01/08/2024 19:12    Review of Systems  Constitutional:  Positive for appetite change, chills and fatigue. Negative for fever.  HENT:  Negative for nosebleeds, sinus pressure, sore throat and trouble swallowing.   Eyes:  Negative for pain and visual disturbance.  Respiratory:  Positive for shortness of breath. Negative for cough and chest tightness.   Cardiovascular:  Positive for leg swelling. Negative for chest pain.  Gastrointestinal:  Positive for nausea and vomiting. Negative for abdominal pain, blood in stool and diarrhea.  Endocrine: Negative for polydipsia and polyuria.  Genitourinary:  Negative for frequency, hematuria and urgency.  Musculoskeletal:  Negative for back pain and myalgias.  Skin:  Negative for pallor and wound.  Neurological:  Positive for weakness. Negative for light-headedness and headaches.  Psychiatric/Behavioral:  Negative for confusion. The patient is nervous/anxious.    Blood pressure 137/65, pulse (!) 58, temperature 97.6 F (36.4 C), temperature source Axillary, resp. rate 20, height 5' 11 (1.803 m), weight (!) 178.3 kg, SpO2 97%. Physical Exam Vitals and nursing note reviewed.  Constitutional:      General: He is not in acute distress.    Appearance: Normal appearance. He is obese. He is not toxic-appearing.  HENT:     Head: Normocephalic and atraumatic.     Right Ear: External ear normal.     Left Ear: External ear normal.     Nose: Nose normal.     Mouth/Throat:     Mouth: Mucous membranes are dry.     Pharynx: Oropharynx is clear.  Eyes:     General: No scleral icterus.    Extraocular Movements: Extraocular movements intact.  Cardiovascular:     Rate and Rhythm: Regular rhythm. Bradycardia present.     Pulses: Normal pulses.     Heart sounds: Normal heart sounds.     No gallop.   Pulmonary:  Effort: Pulmonary effort is normal.     Breath sounds: Normal breath sounds. No wheezing or rales.  Abdominal:     General: Bowel sounds are normal.     Palpations: Abdomen is soft.     Tenderness: There is no abdominal tenderness.  Musculoskeletal:     Cervical back: Normal range of motion and neck supple.     Right lower leg: Edema present.     Left lower leg: Edema present.     Comments: 2+ bilateral lower extremity edema  Lymphadenopathy:     Cervical: No cervical adenopathy.  Skin:    General: Skin is warm and dry.     Coloration: Skin is not pale.  Neurological:     Mental Status: He is alert and oriented to person, place, and time. Mental status is at baseline.     Comments: No asterixis     Assessment/Plan: 1.  Chronic kidney disease stage V: Presented with a multitude of symptoms following what initially may have been a self-limited viral illness that are now suggestive of uremia.  He additionally has been having a sensation of increased pedal edema although remains adherent to his diuretics.  He is agreeable to initiating dialysis understanding that this will be long-term.  Vein mapping ordered and vascular surgery consulted; they will place a tunneled dialysis catheter and permanent access on 2/10.  The process will be initiated for outpatient dialysis placement following this.  Will start diuretics. 2.  Hypertension: Blood pressures currently at goal, will restart his diuretics. 3.  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  Monitor for bleeding diathesis. 4.  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  Will limit dietary phosphorus and monitor on hemodialysis.   Gordy MARLA Blanch 01/09/2024, 7:38 AM

## 2024-01-09 NOTE — Assessment & Plan Note (Signed)
 Chronically documented in the chart, continue with Repatha .  Beta-blockers held.

## 2024-01-09 NOTE — Progress Notes (Signed)
 K-pad to right shoulder not left per patient request

## 2024-01-09 NOTE — Assessment & Plan Note (Signed)
 Chronically documented.  Since the plan for the patient is to get dialysis initiated, I am holding patient's apixaban .

## 2024-01-09 NOTE — Assessment & Plan Note (Signed)
 Not in exacerbation, continue with Singulair 

## 2024-01-10 DIAGNOSIS — N185 Chronic kidney disease, stage 5: Secondary | ICD-10-CM | POA: Diagnosis not present

## 2024-01-10 DIAGNOSIS — R1110 Vomiting, unspecified: Secondary | ICD-10-CM

## 2024-01-10 LAB — HEPATITIS B SURFACE ANTIGEN: Hepatitis B Surface Ag: NONREACTIVE

## 2024-01-10 LAB — PHOSPHORUS: Phosphorus: 4.8 mg/dL — ABNORMAL HIGH (ref 2.5–4.6)

## 2024-01-10 MED ORDER — CHLORHEXIDINE GLUCONATE CLOTH 2 % EX PADS
6.0000 | MEDICATED_PAD | Freq: Every day | CUTANEOUS | Status: DC
  Administered 2024-01-11: 6 via TOPICAL

## 2024-01-10 MED ORDER — FUROSEMIDE 10 MG/ML IJ SOLN
80.0000 mg | Freq: Two times a day (BID) | INTRAMUSCULAR | Status: AC
  Administered 2024-01-10 (×2): 80 mg via INTRAVENOUS
  Filled 2024-01-10 (×2): qty 8

## 2024-01-10 MED ORDER — ALUM & MAG HYDROXIDE-SIMETH 200-200-20 MG/5ML PO SUSP
30.0000 mL | ORAL | Status: DC | PRN
  Administered 2024-01-10: 30 mL via ORAL

## 2024-01-10 NOTE — Progress Notes (Signed)
 Patient ID: Sean [name], male   DOB: June 19, 1971, 53 y.o.   MRN: [id number] [facility] Progress Note   Assessment/ Plan:   1.  Chronic kidney disease stage V: Presented with a multitude of symptoms following what initially may have been a self-limited viral illness that are now suggestive of uremia.  I appreciate vascular surgery seeing him yesterday and putting him on the schedule for [facility] with likely left BCF creation tomorrow.  I will order for hemodialysis starting tomorrow.  Discontinue torsemide  and treat overnight with intravenous furosemide  for volume unloading to help his tolerance of laying flat for surgery tomorrow. 2.  Hypertension: Blood pressures currently elevated, diuretics adjusted. 3.  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  Monitor for bleeding diathesis. 4.  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  Phosphorus level improved on labs this morning.  Subjective:   Had some nausea and vomiting yesterday evening that improved and he was able to sleep.  Tolerated breakfast this morning.   Objective:   BP (!) 156/80 (BP Location: Left Arm)   Pulse 90   Temp 97.7 F (36.5 C) (Oral)   Resp 20   Ht 5' 11 (1.803 m)   Wt (!) 178.8 kg   SpO2 99%   BMI 54.98 kg/m   Intake/Output Summary (Last 24 hours) at 01/10/2024 0853 Last data filed at 01/10/2024 0011 Gross per 24 hour  Intake 50 ml  Output 200 ml  Net -150 ml   Weight change: -2.639 kg  Physical Exam: Gen: Obese man, comfortably on BiPAP.  Aunt at bedside CVS: Regular rhythm, normal rate S1 and S2  Resp: Diminished breath sounds over bases, no rales/rhonchi Abd: Soft, obese, nontender, bowel sounds normal Ext: 2+ bilateral pitting edema  Imaging: US  RENAL Result Date: 01/09/2024 CLINICAL DATA:  Acute kidney injury EXAM: RENAL / URINARY  TRACT ULTRASOUND COMPLETE COMPARISON:  CT abdomen pelvis 11/02/2023 FINDINGS: Right Kidney: Not well visualized due to adjacent shadowing bowel gas. Left Kidney: Not well visualized due to adjacent shadowing bowel gas. Bladder: Appears normal for degree of bladder distention. Other: None. IMPRESSION: Nonvisualization of the kidneys due to patient body habitus and adjacent shadowing bowel gas. Electronically Signed   By: Aliene Lloyd M.D.   On: 01/09/2024 12:44   VAS US  UPPER EXT VEIN MAPPING (PRE-OP  AVF) Result Date: 01/09/2024 UPPER EXTREMITY VEIN MAPPING Patient Name:  Sean [name]  Date of Exam:   01/09/2024 Medical Rec #: [id number]            Accession #:    [id number] Date of Birth: 1971/08/20            Patient Gender: M Patient Age:   37 years Exam Location:  [facility] Procedure:      VAS US  UPPER EXT VEIN MAPPING (PRE-OP  AVF) Referring Phys: Shaneece Stockburger --------------------------------------------------------------------------------  Indications: Pre-access. History: CKD /ESRD.  Comparison Study: No prior study on file Performing Technologist: Alberta Lis RVS  Examination Guidelines: A complete evaluation includes B-mode imaging, spectral Doppler, color Doppler, and power Doppler as needed of all accessible portions of each vessel. Bilateral testing is considered an integral part of a complete examination. Limited examinations for reoccurring indications may be performed as noted. +-----------------+-------------+----------+---------+ Right Cephalic   Diameter (cm)Depth (cm)Findings  +-----------------+-------------+----------+---------+ Prox upper arm       0.47        0.63             +-----------------+-------------+----------+---------+  Mid upper arm        0.47        0.41             +-----------------+-------------+----------+---------+ Dist upper arm       0.51        0.43             +-----------------+-------------+----------+---------+ Antecubital fossa     0.60        0.31             +-----------------+-------------+----------+---------+ Prox forearm       0.39/0.35  0.33/0.78 branching +-----------------+-------------+----------+---------+ Mid forearm          0.46        0.46             +-----------------+-------------+----------+---------+ Dist forearm         0.38        0.39             +-----------------+-------------+----------+---------+ Wrist                0.30        0.53             +-----------------+-------------+----------+---------+ +-----------------+-------------+----------+---------+ Right Basilic    Diameter (cm)Depth (cm)Findings  +-----------------+-------------+----------+---------+ Prox upper arm       0.65        2.11    origin   +-----------------+-------------+----------+---------+ Mid upper arm        0.52        1.18             +-----------------+-------------+----------+---------+ Dist upper arm       0.40        0.71             +-----------------+-------------+----------+---------+ Antecubital fossa    0.32        0.36             +-----------------+-------------+----------+---------+ Prox forearm         0.34        0.29             +-----------------+-------------+----------+---------+ Mid forearm        0.35/0.25  0.36/0.24 branching +-----------------+-------------+----------+---------+ Distal forearm       0.21        0.42             +-----------------+-------------+----------+---------+ Wrist                0.37        0.58             +-----------------+-------------+----------+---------+ +-----------------+-------------+----------+---------+ Left Cephalic    Diameter (cm)Depth (cm)Findings  +-----------------+-------------+----------+---------+ Prox upper arm       0.65        0.47             +-----------------+-------------+----------+---------+ Mid upper arm        0.48        0.55              +-----------------+-------------+----------+---------+ Dist upper arm       0.63        0.48             +-----------------+-------------+----------+---------+ Antecubital fossa    0.79        0.45             +-----------------+-------------+----------+---------+ Prox forearm       0.49/0.45  0.70/0.62 branching +-----------------+-------------+----------+---------+ Mid forearm  0.42        0.46             +-----------------+-------------+----------+---------+ Dist forearm         0.38        0.46             +-----------------+-------------+----------+---------+ Wrist                0.37        0.58             +-----------------+-------------+----------+---------+ +-----------------+-------------+----------+--------+ Left Basilic     Diameter (cm)Depth (cm)Findings +-----------------+-------------+----------+--------+ Prox upper arm       0.54        0.17    origin  +-----------------+-------------+----------+--------+ Mid upper arm        0.76        0.98            +-----------------+-------------+----------+--------+ Dist upper arm       0.42        0.97            +-----------------+-------------+----------+--------+ Antecubital fossa    0.57        1.22            +-----------------+-------------+----------+--------+ Prox forearm         0.40        0.55            +-----------------+-------------+----------+--------+ Mid forearm          0.21        0.40            +-----------------+-------------+----------+--------+ Distal forearm       0.15        0.25            +-----------------+-------------+----------+--------+ Wrist                0.17        0.38            +-----------------+-------------+----------+--------+ *See table(s) above for measurements and observations.  Diagnosing physician:    Preliminary    DG Chest Port 1 View Result Date: 01/08/2024 CLINICAL DATA:  Shortness of breath EXAM: PORTABLE CHEST 1 VIEW  COMPARISON:  Chest x-ray 01/05/2024 FINDINGS: The cardiac silhouette is enlarged, unchanged. Sternotomy wires are present. There is no focal lung infiltrate, pleural effusion or pneumothorax. There stable elevation of the left hemidiaphragm. No acute fractures are seen. IMPRESSION: 1. No active disease. 2. Stable cardiomegaly. Electronically Signed   By: Greig Pique M.D.   On: 01/08/2024 19:12    Labs: BMET Recent Labs  Lab 01/05/24 1238 01/08/24 1919 01/09/24 0639 01/10/24 0317  NA 142 145 144  --   K 3.9 3.5 3.7  --   CL 107 110 110  --   CO2 24 25 22   --   GLUCOSE 151* 107* 131*  --   BUN 57* 65* 65*  --   CREATININE 6.95* 7.17* 7.44*  --   CALCIUM  8.9 9.3 8.9  --   PHOS  --   --  5.5* 4.8*   CBC Recent Labs  Lab 01/05/24 1238 01/08/24 1919 01/09/24 0639  WBC 6.5 5.7 6.2  NEUTROABS 3.6  --  2.9  HGB 10.8* 11.2* 10.1*  HCT 32.8* 34.2* 30.5*  MCV 103.5* 103.0* 102.3*  PLT 66* 65* 64*    Medications:     allopurinol   100 mg Oral Daily   amitriptyline   25 mg Oral QHS  calcium  acetate  667 mg Oral TID WC   empagliflozin   10 mg Oral Daily   famotidine   20 mg Oral BID   metoCLOPramide  (REGLAN ) injection  10 mg Intravenous Once   montelukast   10 mg Oral Daily   pantoprazole   40 mg Oral BID   torsemide   20 mg Oral Daily    Gordy Blanch, MD 01/10/2024, 8:53 AM

## 2024-01-10 NOTE — Progress Notes (Addendum)
  Progress Note    01/10/2024 7:58 AM * No surgery found *  Subjective:  says he feels ok this morning. He had some nausea and vomiting yesterday    Vitals:   01/09/24 2310 01/10/24 0500  BP: (!) 149/78 (!) 152/63  Pulse: 76 90  Resp: 15 20  Temp: 98.1 F (36.7 C) 98.4 F (36.9 C)  SpO2: 96% 99%    Physical Exam: General:  sitting up in bed, NAD Cardiac:  regular Lungs:  nonlabored Extremities:  palpable left radial pulse   CBC    Component Value Date/Time   WBC 6.2 01/09/2024 0639   RBC 2.98 (L) 01/09/2024 0639   HGB 10.1 (L) 01/09/2024 0639   HGB 14.4 10/31/2020 0958   HCT 30.5 (L) 01/09/2024 0639   HCT 42.5 10/31/2020 0958   PLT 64 (L) 01/09/2024 0639   PLT 55 (LL) 05/04/2023 1119   MCV 102.3 (H) 01/09/2024 0639   MCV 99 (A) 03/24/2022 0000   MCH 33.9 01/09/2024 0639   MCHC 33.1 01/09/2024 0639   RDW 14.5 01/09/2024 0639   RDW 13.6 10/31/2020 0958   LYMPHSABS 1.6 01/09/2024 0639   LYMPHSABS 1.4 09/14/2019 1350   MONOABS 1.0 01/09/2024 0639   EOSABS 0.6 (H) 01/09/2024 0639   EOSABS 0.2 09/14/2019 1350   BASOSABS 0.1 01/09/2024 0639   BASOSABS 0.0 09/14/2019 1350    BMET    Component Value Date/Time   NA 144 01/09/2024 0639   NA 140 07/27/2023 0000   K 3.7 01/09/2024 0639   CL 110 01/09/2024 0639   CO2 22 01/09/2024 0639   GLUCOSE 131 (H) 01/09/2024 0639   BUN 65 (H) 01/09/2024 0639   BUN 45 (A) 07/27/2023 0000   CREATININE 7.44 (H) 01/09/2024 0639   CREATININE 3.02 (H) 04/03/2023 1609   CREATININE 1.95 (H) 12/13/2021 1808   CALCIUM  8.9 01/09/2024 0639   CALCIUM  9.1 12/23/2023 0000   GFRNONAA 8 (L) 01/09/2024 0639   GFRNONAA 24 (L) 04/03/2023 1609   GFRAA 57 (L) 10/31/2020 0958    INR    Component Value Date/Time   INR 1.1 06/26/2023 0736     Intake/Output Summary (Last 24 hours) at 01/10/2024 0758 Last data filed at 01/10/2024 0011 Gross per 24 hour  Intake 50 ml  Output 200 ml  Net -150 ml      Assessment/Plan:  53 y.o. male  in need of dialysis access   -Vein mapping was performed yesterday and demonstrates a great cephalic vein in the left upper arm -I have discussed with the patient we can proceed with [facility] placement and likely left brachiocephalic fistula creation in the OR tomorrow. All questions were answered -Will make NPO at midnight. Consent orders placed. Continue to hold home Eliquis    [name], [place] [facility] [phone] 01/10/2024 7:58 AM  VASCULAR STAFF ADDENDUM: I have independently interviewed and examined the patient. I agree with the above.  OR tomorrow for L arm Avf, TDC  Fonda FORBES Rim MD [facility] Phone Number: [phone] 01/10/2024 10:04 AM

## 2024-01-10 NOTE — Progress Notes (Signed)
 PROGRESS NOTE    Sean [name]  FMW:[id number] DOB: 1971/10/19 DOA: 01/08/2024 PCP: Jesus Bernardino MATSU, MD    Brief Narrative:  Sean [name] is a 53 y.o. male with medical history significant of chronic kidney disease.  Patient has been medically managed with diuretics as well as Jardiance  as well as losartan  in the past.  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  Patient states that for approximately 1 or 2 months he has had intermittent episodes of nausea, poor appetite.  Patient is s/p evaluation by nephrology service Dr. Tobie as well as vascular surgical team. Overall plan/impression is uremia with need to start inpatient dialysis after placement of fistula in the left upper extremity as well as tunneled hemodialysis catheter.  Assessment and Plan: * Acute renal failure superimposed on stage 5 chronic kidney disease, not on chronic dialysis [facility]) -Appreciate nephrology evaluation.   -OR tomorrow for L arm Avf, TDC  -plan to start HD 2/10  Asthma, chronic Not in exacerbation, continue with Singulair   Nausea and vomiting Exacerbated likely due to uremia or uremia -PRNs  Gout Chronic, continue with allopurinol   Atrial flutter with rapid ventricular response (HCC) - plan for the patient is to get dialysis initiated, -hold patient's apixaban .  CAD (coronary artery disease) -resume meds as able  Hypertension -PRN agents  OSA -CPAP QHS  Obesity Estimated body mass index is 54.98 kg/m as calculated from the following:   Height as of this encounter: 5' 11 (1.803 m).   Weight as of this encounter: 178.8 kg.     DVT prophylaxis: SCDs Start: 01/09/24 0226    Code Status: Full Code Family Communication: wife at bedside  Disposition Plan:  Level of care: Progressive Status is: Inpatient     Consultants:  Renal vascular    Subjective: sleeping  Objective: Vitals:   01/09/24 2310 01/10/24 0500 01/10/24 0626 01/10/24 0804  BP: (!)  149/78 (!) 152/63  (!) 156/80  Pulse: 76 90    Resp: 15 20  20   Temp: 98.1 F (36.7 C) 98.4 F (36.9 C)  97.7 F (36.5 C)  TempSrc: Oral Oral  Oral  SpO2: 96% 99%    Weight:   (!) 178.8 kg   Height:        Intake/Output Summary (Last 24 hours) at 01/10/2024 1109 Last data filed at 01/10/2024 0011 Gross per 24 hour  Intake 50 ml  Output --  Net 50 ml   Filed Weights   01/08/24 1802 01/09/24 0206 01/10/24 0626  Weight: (!) 181.4 kg (!) 178.3 kg (!) 178.8 kg    Examination:   General: Appearance:    Severely obese male in no acute distress     Lungs:     Clear to auscultation bilaterally, respirations unlabored  Heart:    Normal heart rate. Normal rhythm. No murmurs, rubs, or gallops.    MS:   All extremities are intact.    Neurologic:   Awake, alert, oriented x 3. No apparent focal neurological           defect.        Data Reviewed: I have personally reviewed following labs and imaging studies  CBC: Recent Labs  Lab 01/05/24 1238 01/08/24 1919 01/09/24 0639  WBC 6.5 5.7 6.2  NEUTROABS 3.6  --  2.9  HGB 10.8* 11.2* 10.1*  HCT 32.8* 34.2* 30.5*  MCV 103.5* 103.0* 102.3*  PLT 66* 65* 64*   Basic Metabolic Panel: Recent Labs  Lab 01/05/24 1238 01/08/24 1919 01/09/24 0639 01/10/24 0317  NA 142 145 144  --   K 3.9 3.5 3.7  --   CL 107 110 110  --   CO2 24 25 22   --   GLUCOSE 151* 107* 131*  --   BUN 57* 65* 65*  --   CREATININE 6.95* 7.17* 7.44*  --   CALCIUM  8.9 9.3 8.9  --   MG 2.4  --  2.5*  --   PHOS  --   --  5.5* 4.8*   GFR: Estimated Creatinine Clearance: 19.4 mL/min (A) (by C-G formula based on SCr of 7.44 mg/dL (H)). Liver Function Tests: Recent Labs  Lab 01/05/24 1238 01/08/24 1919 01/09/24 0639  AST 27 19 21   ALT 26 19 21   ALKPHOS 140* 130* 115  BILITOT 0.7 0.6 0.9  PROT 5.9* 6.0* 5.6*  ALBUMIN  2.4* 2.8* 2.2*   Recent Labs  Lab 01/08/24 1919  LIPASE 126*   No results for input(s): AMMONIA in the last 168  hours. Coagulation Profile: No results for input(s): INR, PROTIME in the last 168 hours. Cardiac Enzymes: Recent Labs  Lab 01/09/24 0639  CKTOTAL 96   BNP (last 3 results) No results for input(s): PROBNP in the last 8760 hours. HbA1C: No results for input(s): HGBA1C in the last 72 hours. CBG: No results for input(s): GLUCAP in the last 168 hours. Lipid Profile: No results for input(s): CHOL, HDL, LDLCALC, TRIG, CHOLHDL, LDLDIRECT in the last 72 hours. Thyroid  Function Tests: No results for input(s): TSH, T4TOTAL, FREET4, T3FREE, THYROIDAB in the last 72 hours. Anemia Panel: No results for input(s): VITAMINB12, FOLATE, FERRITIN, TIBC, IRON, RETICCTPCT in the last 72 hours. Sepsis Labs: No results for input(s): PROCALCITON, LATICACIDVEN in the last 168 hours.  Recent Results (from the past 240 hours)  Resp panel by RT-PCR (RSV, Flu A&B, Covid) Anterior Nasal Swab     Status: None   Collection Time: 01/05/24 12:30 PM   Specimen: Anterior Nasal Swab  Result Value Ref Range Status   SARS Coronavirus 2 by RT PCR NEGATIVE NEGATIVE Final   Influenza A by PCR NEGATIVE NEGATIVE Final   Influenza B by PCR NEGATIVE NEGATIVE Final    Comment: (NOTE) The Xpert Xpress SARS-CoV-2/FLU/RSV plus assay is intended as an aid in the diagnosis of influenza from Nasopharyngeal swab specimens and should not be used as a sole basis for treatment. Nasal washings and aspirates are unacceptable for Xpert Xpress SARS-CoV-2/FLU/RSV testing.  Fact Sheet for Patients: bloggercourse.com  Fact Sheet for Healthcare Providers: seriousbroker.it  This test is not yet approved or cleared by the [place]  FDA and has been authorized for detection and/or diagnosis of SARS-CoV-2 by FDA under an Emergency Use Authorization (EUA). This EUA will remain in effect (meaning this test can be used) for the duration of  the COVID-19 declaration under Section 564(b)(1) of the Act, 21 U.S.C. section 360bbb-3(b)(1), unless the authorization is terminated or revoked.     Resp Syncytial Virus by PCR NEGATIVE NEGATIVE Final    Comment: (NOTE) Fact Sheet for Patients: bloggercourse.com  Fact Sheet for Healthcare Providers: seriousbroker.it  This test is not yet approved or cleared by the [place]  FDA and has been authorized for detection and/or diagnosis of SARS-CoV-2 by FDA under an Emergency Use Authorization (EUA). This EUA will remain in effect (meaning this test can be used) for the duration of the COVID-19 declaration under Section 564(b)(1) of the Act, 21 U.S.C. section 360bbb-3(b)(1), unless the  authorization is terminated or revoked.  Performed at [facility], [address], [place], [place] [address]          Radiology Studies: VAS US  UPPER EXT VEIN MAPPING (PRE-OP  AVF) Result Date: 01/10/2024 UPPER EXTREMITY VEIN MAPPING Patient Name:  Norton [facility]  Date of Exam:   01/09/2024 Medical Rec #: [id number]            Accession #:    [id number] Date of Birth: Aug 17, 1971            Patient Gender: M Patient Age:   89 years Exam Location:  [facility] Procedure:      VAS US  UPPER EXT VEIN MAPPING (PRE-OP  AVF) Referring Phys: JAY PATEL --------------------------------------------------------------------------------  Indications: Pre-access. History: CKD /ESRD.  Comparison Study: No prior study on file Performing Technologist: Alberta Lis RVS  Examination Guidelines: A complete evaluation includes B-mode imaging, spectral Doppler, color Doppler, and power Doppler as needed of all accessible portions of each vessel. Bilateral testing is considered an integral part of a complete examination. Limited examinations for reoccurring indications may be performed as noted. +-----------------+-------------+----------+---------+ Right  Cephalic   Diameter (cm)Depth (cm)Findings  +-----------------+-------------+----------+---------+ Prox upper arm       0.47        0.63             +-----------------+-------------+----------+---------+ Mid upper arm        0.47        0.41             +-----------------+-------------+----------+---------+ Dist upper arm       0.51        0.43             +-----------------+-------------+----------+---------+ Antecubital fossa    0.60        0.31             +-----------------+-------------+----------+---------+ Prox forearm       0.39/0.35  0.33/0.78 branching +-----------------+-------------+----------+---------+ Mid forearm          0.46        0.46             +-----------------+-------------+----------+---------+ Dist forearm         0.38        0.39             +-----------------+-------------+----------+---------+ Wrist                0.30        0.53             +-----------------+-------------+----------+---------+ +-----------------+-------------+----------+---------+ Right Basilic    Diameter (cm)Depth (cm)Findings  +-----------------+-------------+----------+---------+ Prox upper arm       0.65        2.11    origin   +-----------------+-------------+----------+---------+ Mid upper arm        0.52        1.18             +-----------------+-------------+----------+---------+ Dist upper arm       0.40        0.71             +-----------------+-------------+----------+---------+ Antecubital fossa    0.32        0.36             +-----------------+-------------+----------+---------+ Prox forearm         0.34        0.29             +-----------------+-------------+----------+---------+ Mid  forearm        0.35/0.25  0.36/0.24 branching +-----------------+-------------+----------+---------+ Distal forearm       0.21        0.42             +-----------------+-------------+----------+---------+ Wrist                0.37         0.58             +-----------------+-------------+----------+---------+ +-----------------+-------------+----------+---------+ Left Cephalic    Diameter (cm)Depth (cm)Findings  +-----------------+-------------+----------+---------+ Prox upper arm       0.65        0.47             +-----------------+-------------+----------+---------+ Mid upper arm        0.48        0.55             +-----------------+-------------+----------+---------+ Dist upper arm       0.63        0.48             +-----------------+-------------+----------+---------+ Antecubital fossa    0.79        0.45             +-----------------+-------------+----------+---------+ Prox forearm       0.49/0.45  0.70/0.62 branching +-----------------+-------------+----------+---------+ Mid forearm          0.42        0.46             +-----------------+-------------+----------+---------+ Dist forearm         0.38        0.46             +-----------------+-------------+----------+---------+ Wrist                0.37        0.58             +-----------------+-------------+----------+---------+ +-----------------+-------------+----------+--------+ Left Basilic     Diameter (cm)Depth (cm)Findings +-----------------+-------------+----------+--------+ Prox upper arm       0.54        0.17    origin  +-----------------+-------------+----------+--------+ Mid upper arm        0.76        0.98            +-----------------+-------------+----------+--------+ Dist upper arm       0.42        0.97            +-----------------+-------------+----------+--------+ Antecubital fossa    0.57        1.22            +-----------------+-------------+----------+--------+ Prox forearm         0.40        0.55            +-----------------+-------------+----------+--------+ Mid forearm          0.21        0.40            +-----------------+-------------+----------+--------+ Distal forearm        0.15        0.25            +-----------------+-------------+----------+--------+ Wrist                0.17        0.38            +-----------------+-------------+----------+--------+ *See table(s) above for measurements and observations.  Diagnosing physician: Fonda Rim Electronically signed by Fonda Rim on  01/10/2024 at 10:26:10 AM.    Final    US  RENAL Result Date: 01/09/2024 CLINICAL DATA:  Acute kidney injury EXAM: RENAL / URINARY TRACT ULTRASOUND COMPLETE COMPARISON:  CT abdomen pelvis 11/02/2023 FINDINGS: Right Kidney: Not well visualized due to adjacent shadowing bowel gas. Left Kidney: Not well visualized due to adjacent shadowing bowel gas. Bladder: Appears normal for degree of bladder distention. Other: None. IMPRESSION: Nonvisualization of the kidneys due to patient body habitus and adjacent shadowing bowel gas. Electronically Signed   By: Aliene Lloyd M.D.   On: 01/09/2024 12:44   DG Chest Port 1 View Result Date: 01/08/2024 CLINICAL DATA:  Shortness of breath EXAM: PORTABLE CHEST 1 VIEW COMPARISON:  Chest x-ray 01/05/2024 FINDINGS: The cardiac silhouette is enlarged, unchanged. Sternotomy wires are present. There is no focal lung infiltrate, pleural effusion or pneumothorax. There stable elevation of the left hemidiaphragm. No acute fractures are seen. IMPRESSION: 1. No active disease. 2. Stable cardiomegaly. Electronically Signed   By: Greig Pique M.D.   On: 01/08/2024 19:12        Scheduled Meds:  allopurinol   100 mg Oral Daily   amitriptyline   25 mg Oral QHS   calcium  acetate  667 mg Oral TID WC   Chlorhexidine  Gluconate Cloth  6 each Topical Q0600   empagliflozin   10 mg Oral Daily   famotidine   20 mg Oral BID   furosemide   80 mg Intravenous BID   metoCLOPramide  (REGLAN ) injection  10 mg Intravenous Once   montelukast   10 mg Oral Daily   pantoprazole   40 mg Oral BID   Continuous Infusions:  promethazine  (PHENERGAN ) injection (IM or IVPB) Stopped  (01/09/24 2303)     LOS: 1 day    Time spent: 45 minutes spent on chart review, discussion with nursing staff, consultants, updating family and interview/physical exam; more than 50% of that time was spent in counseling and/or coordination of care.    Harlene RAYMOND Bowl, DO [facility] Available via Epic secure chat 7am-7pm After these hours, please refer to coverage provider listed on [email or web address] 01/10/2024, 11:09 AM

## 2024-01-11 ENCOUNTER — Inpatient Hospital Stay (HOSPITAL_COMMUNITY): Payer: BC Managed Care – PPO

## 2024-01-11 ENCOUNTER — Encounter (HOSPITAL_COMMUNITY): Payer: Self-pay | Admitting: Family Medicine

## 2024-01-11 ENCOUNTER — Inpatient Hospital Stay (HOSPITAL_COMMUNITY): Payer: BC Managed Care – PPO | Admitting: Anesthesiology

## 2024-01-11 ENCOUNTER — Other Ambulatory Visit: Payer: Self-pay

## 2024-01-11 ENCOUNTER — Encounter (HOSPITAL_COMMUNITY)
Admission: EM | Disposition: A | Discharge status: Home / Self Care | Payer: Self-pay | Source: Home / Self Care | Attending: Internal Medicine

## 2024-01-11 DIAGNOSIS — R1110 Vomiting, unspecified: Secondary | ICD-10-CM | POA: Diagnosis not present

## 2024-01-11 DIAGNOSIS — N185 Chronic kidney disease, stage 5: Secondary | ICD-10-CM | POA: Diagnosis not present

## 2024-01-11 HISTORY — PX: INSERTION OF DIALYSIS CATHETER: SHX1324

## 2024-01-11 HISTORY — PX: AV FISTULA PLACEMENT: SHX1204

## 2024-01-11 LAB — POCT I-STAT, CHEM 8
BUN: 59 mg/dL — ABNORMAL HIGH (ref 6–20)
Calcium, Ion: 1.11 mmol/L — ABNORMAL LOW (ref 1.15–1.40)
Chloride: 108 mmol/L (ref 98–111)
Creatinine, Ser: 8.5 mg/dL — ABNORMAL HIGH (ref 0.61–1.24)
Glucose, Bld: 124 mg/dL — ABNORMAL HIGH (ref 70–99)
HCT: 35 % — ABNORMAL LOW (ref 39.0–52.0)
Hemoglobin: 11.9 g/dL — ABNORMAL LOW (ref 13.0–17.0)
Potassium: 3.9 mmol/L (ref 3.5–5.1)
Sodium: 144 mmol/L (ref 135–145)
TCO2: 26 mmol/L (ref 22–32)

## 2024-01-11 LAB — SURGICAL PCR SCREEN
MRSA, PCR: NEGATIVE
Staphylococcus aureus: NEGATIVE

## 2024-01-11 SURGERY — ARTERIOVENOUS (AV) FISTULA CREATION
Anesthesia: General | Site: Neck | Laterality: Right

## 2024-01-11 MED ORDER — PROPOFOL 10 MG/ML IV BOLUS
INTRAVENOUS | Status: AC
  Filled 2024-01-11: qty 20

## 2024-01-11 MED ORDER — CEFAZOLIN SODIUM-DEXTROSE 1-4 GM/50ML-% IV SOLN
INTRAVENOUS | Status: DC | PRN
Start: 2024-01-11 — End: 2024-01-11
  Administered 2024-01-11: 3 g via INTRAVENOUS

## 2024-01-11 MED ORDER — SODIUM CHLORIDE 0.9 % IV SOLN
INTRAVENOUS | Status: DC | PRN
Start: 2024-01-11 — End: 2024-01-11

## 2024-01-11 MED ORDER — METOPROLOL TARTRATE 12.5 MG HALF TABLET
ORAL_TABLET | ORAL | Status: AC
Start: 2024-01-11 — End: 2024-01-11
  Administered 2024-01-11: 25 mg via ORAL
  Filled 2024-01-11: qty 2

## 2024-01-11 MED ORDER — METOPROLOL TARTRATE 12.5 MG HALF TABLET: 25.0000 mg | ORAL_TABLET | Freq: Once | ORAL | Status: AC

## 2024-01-11 MED ORDER — HEPARIN 6000 UNIT IRRIGATION SOLUTION
Status: DC | PRN
  Administered 2024-01-11: 1

## 2024-01-11 MED ORDER — CEFAZOLIN SODIUM-DEXTROSE 3-4 GM/150ML-% IV SOLN
INTRAVENOUS | Status: AC
Start: 2024-01-11 — End: 2024-01-11
  Filled 2024-01-11: qty 150

## 2024-01-11 MED ORDER — DEXAMETHASONE SODIUM PHOSPHATE 10 MG/ML IJ SOLN
INTRAMUSCULAR | Status: DC | PRN
  Administered 2024-01-11: 5 mg via INTRAVENOUS

## 2024-01-11 MED ORDER — ROCURONIUM BROMIDE 10 MG/ML (PF) SYRINGE
PREFILLED_SYRINGE | INTRAVENOUS | Status: DC | PRN
  Administered 2024-01-11: 70 mg via INTRAVENOUS

## 2024-01-11 MED ORDER — PHENYLEPHRINE 80 MCG/ML (10ML) SYRINGE FOR IV PUSH (FOR BLOOD PRESSURE SUPPORT)
PREFILLED_SYRINGE | INTRAVENOUS | Status: DC | PRN
  Administered 2024-01-11: 80 ug via INTRAVENOUS

## 2024-01-11 MED ORDER — OXYCODONE-ACETAMINOPHEN 5-325 MG PO TABS
1.0000 | ORAL_TABLET | ORAL | Status: DC | PRN
  Administered 2024-01-12: 1 via ORAL
  Administered 2024-01-13: 2 via ORAL
  Administered 2024-01-15: 1 via ORAL
  Administered 2024-01-15 – 2024-01-16 (×2): 2 via ORAL
  Filled 2024-01-11: qty 2
  Filled 2024-01-11: qty 1
  Filled 2024-01-11 (×2): qty 2
  Filled 2024-01-11: qty 1

## 2024-01-11 MED ORDER — LIDOCAINE 2% (20 MG/ML) 5 ML SYRINGE
INTRAMUSCULAR | Status: DC | PRN
Start: 2024-01-11 — End: 2024-01-11
  Administered 2024-01-11: 100 mg via INTRAVENOUS

## 2024-01-11 MED ORDER — SUCCINYLCHOLINE CHLORIDE 200 MG/10ML IV SOSY
PREFILLED_SYRINGE | INTRAVENOUS | Status: DC | PRN
Start: 2024-01-11 — End: 2024-01-11
  Administered 2024-01-11: 200 mg via INTRAVENOUS

## 2024-01-11 MED ORDER — PROPOFOL 10 MG/ML IV BOLUS
INTRAVENOUS | Status: DC | PRN
  Administered 2024-01-11: 200 mg via INTRAVENOUS

## 2024-01-11 MED ORDER — HEPARIN SODIUM (PORCINE) 1000 UNIT/ML IJ SOLN
INTRAMUSCULAR | Status: DC | PRN
  Administered 2024-01-11: 3800 [IU]

## 2024-01-11 MED ORDER — HEPARIN SODIUM (PORCINE) 1000 UNIT/ML IJ SOLN
INTRAMUSCULAR | Status: AC
  Administered 2024-01-11: 3800 [IU]
  Filled 2024-01-11: qty 4

## 2024-01-11 MED ORDER — FENTANYL CITRATE (PF) 250 MCG/5ML IJ SOLN
INTRAMUSCULAR | Status: AC
  Filled 2024-01-11: qty 5

## 2024-01-11 MED ORDER — HEPARIN SODIUM (PORCINE) 1000 UNIT/ML IJ SOLN
INTRAMUSCULAR | Status: AC
  Filled 2024-01-11: qty 10

## 2024-01-11 MED ORDER — ONDANSETRON HCL 4 MG/2ML IJ SOLN
INTRAMUSCULAR | Status: DC | PRN
  Administered 2024-01-11: 4 mg via INTRAVENOUS

## 2024-01-11 MED ORDER — FENTANYL CITRATE (PF) 250 MCG/5ML IJ SOLN
INTRAMUSCULAR | Status: DC | PRN
  Administered 2024-01-11 (×2): 50 ug via INTRAVENOUS
  Administered 2024-01-11: 100 ug via INTRAVENOUS
  Administered 2024-01-11: 25 ug via INTRAVENOUS

## 2024-01-11 MED ORDER — PHENYLEPHRINE HCL-NACL 20-0.9 MG/250ML-% IV SOLN
INTRAVENOUS | Status: DC | PRN
  Administered 2024-01-11: 40 ug/min via INTRAVENOUS

## 2024-01-11 MED ORDER — LIDOCAINE HCL (PF) 1 % IJ SOLN
INTRAMUSCULAR | Status: AC
  Filled 2024-01-11: qty 30

## 2024-01-11 MED ORDER — SUGAMMADEX SODIUM 200 MG/2ML IV SOLN
INTRAVENOUS | Status: DC | PRN
  Administered 2024-01-11: 200 mg via INTRAVENOUS

## 2024-01-11 MED ORDER — HEPARIN SODIUM (PORCINE) 1000 UNIT/ML IJ SOLN
INTRAMUSCULAR | Status: DC | PRN
  Administered 2024-01-11: 2000 [IU] via INTRAVENOUS

## 2024-01-11 MED ORDER — 0.9 % SODIUM CHLORIDE (POUR BTL) OPTIME
TOPICAL | Status: DC | PRN
  Administered 2024-01-11: 1000 mL

## 2024-01-11 MED ORDER — CHLORHEXIDINE GLUCONATE 0.12 % MT SOLN: 15.0000 mL | Freq: Once | OROMUCOSAL | Status: AC

## 2024-01-11 MED ORDER — ORAL CARE MOUTH RINSE
15.0000 mL | Freq: Once | OROMUCOSAL | Status: AC
  Administered 2024-01-11: 15 mL via OROMUCOSAL

## 2024-01-11 MED ORDER — HEPARIN 6000 UNIT IRRIGATION SOLUTION
Status: AC
  Filled 2024-01-11: qty 500

## 2024-01-11 SURGICAL SUPPLY — 71 items
APPLIER CLIP 9.375 SM OPEN (CLIP) ×2
ARMBAND PINK RESTRICT EXTREMIT (MISCELLANEOUS) ×2 IMPLANT
BAG COUNTER SPONGE SURGICOUNT (BAG) ×2 IMPLANT
BAG DECANTER FOR FLEXI CONT (MISCELLANEOUS) ×2 IMPLANT
BIOPATCH RED 1 DISK 7.0 (GAUZE/BANDAGES/DRESSINGS) ×2 IMPLANT
BLADE CLIPPER SURG (BLADE) ×2 IMPLANT
BNDG ELASTIC 4X5.8 VLCR NS LF (GAUZE/BANDAGES/DRESSINGS) IMPLANT
BNDG ELASTIC 4X5.8 VLCR STR LF (GAUZE/BANDAGES/DRESSINGS) ×2 IMPLANT
CANISTER SUCT 3000ML PPV (MISCELLANEOUS) ×2 IMPLANT
CATH BEACON 5 .035 65 KMP TIP (CATHETERS) IMPLANT
CATH PALINDROME 19 SP (CATHETERS) IMPLANT
CATH PALINDROME-P 19CM W/VT (CATHETERS) IMPLANT
CATH PALINDROME-P 23 W/VT (CATHETERS) IMPLANT
CATH PALINDROME-P 23CM W/VT (CATHETERS) IMPLANT
CATH PALINDROME-P 28CM W/VT (CATHETERS) IMPLANT
CATH QUICKCROSS SUPP .035X90CM (MICROCATHETER) IMPLANT
CATH STRAIGHT 5FR 65CM (CATHETERS) IMPLANT
CLIP APPLIE 9.375 SM OPEN (CLIP) ×2 IMPLANT
CLIP TI MEDIUM 6 (CLIP) ×6 IMPLANT
CLIP TI WIDE RED SMALL 6 (CLIP) ×4 IMPLANT
COVER PROBE W GEL 5X96 (DRAPES) ×2 IMPLANT
COVER SURGICAL LIGHT HANDLE (MISCELLANEOUS) ×2 IMPLANT
DERMABOND ADVANCED .7 DNX12 (GAUZE/BANDAGES/DRESSINGS) ×2 IMPLANT
DERMABOND ADVANCED .7 DNX6 (GAUZE/BANDAGES/DRESSINGS) IMPLANT
DRAPE C-ARM 42X72 X-RAY (DRAPES) ×2 IMPLANT
DRAPE CHEST BREAST 15X10 FENES (DRAPES) ×2 IMPLANT
ELECT REM PT RETURN 9FT ADLT (ELECTROSURGICAL) ×2
ELECTRODE REM PT RTRN 9FT ADLT (ELECTROSURGICAL) ×2 IMPLANT
GAUZE 4X4 16PLY ~~LOC~~+RFID DBL (SPONGE) ×2 IMPLANT
GAUZE SPONGE 4X4 12PLY STRL (GAUZE/BANDAGES/DRESSINGS) IMPLANT
GLIDEWIRE ADV .035X180CM (WIRE) IMPLANT
GLOVE BIOGEL PI IND STRL 8 (GLOVE) ×2 IMPLANT
GOWN STRL REUS W/ TWL LRG LVL3 (GOWN DISPOSABLE) ×4 IMPLANT
GOWN STRL REUS W/TWL 2XL LVL3 (GOWN DISPOSABLE) ×4 IMPLANT
KIT BASIN OR (CUSTOM PROCEDURE TRAY) ×2 IMPLANT
KIT PALINDROME-P 55CM (CATHETERS) IMPLANT
KIT TURNOVER KIT B (KITS) ×2 IMPLANT
LOOP VESSEL MINI RED (MISCELLANEOUS) IMPLANT
NDL 18GX1X1/2 (RX/OR ONLY) (NEEDLE) ×2 IMPLANT
NDL HYPO 25GX1X1/2 BEV (NEEDLE) ×2 IMPLANT
NEEDLE 18GX1X1/2 (RX/OR ONLY) (NEEDLE) ×2 IMPLANT
NEEDLE HYPO 25GX1X1/2 BEV (NEEDLE) ×2 IMPLANT
NS IRRIG 1000ML POUR BTL (IV SOLUTION) ×2 IMPLANT
PACK CV ACCESS (CUSTOM PROCEDURE TRAY) ×2 IMPLANT
PACK SRG BSC III STRL LF ECLPS (CUSTOM PROCEDURE TRAY) ×2 IMPLANT
PAD ARMBOARD 7.5X6 YLW CONV (MISCELLANEOUS) ×4 IMPLANT
SET MICROPUNCTURE 5F STIFF (MISCELLANEOUS) IMPLANT
SLING ARM FOAM STRAP LRG (SOFTGOODS) IMPLANT
SLING ARM FOAM STRAP MED (SOFTGOODS) IMPLANT
SOAP 2 % CHG 4 OZ (WOUND CARE) ×2 IMPLANT
SPIKE FLUID TRANSFER (MISCELLANEOUS) ×2 IMPLANT
SPONGE T-LAP 18X18 ~~LOC~~+RFID (SPONGE) IMPLANT
SUT ETHILON 3 0 PS 1 (SUTURE) ×2 IMPLANT
SUT MNCRL AB 4-0 PS2 18 (SUTURE) ×2 IMPLANT
SUT PROLENE 6 0 BV (SUTURE) ×2 IMPLANT
SUT PROLENE 7 0 BV 1 (SUTURE) IMPLANT
SUT SILK 2 0 PERMA HAND 18 BK (SUTURE) IMPLANT
SUT SILK 2 0 SH (SUTURE) IMPLANT
SUT SILK 3 0 SH CR/8 (SUTURE) ×2 IMPLANT
SUT VIC AB 3-0 SH 27X BRD (SUTURE) ×2 IMPLANT
SYR 10ML LL (SYRINGE) ×2 IMPLANT
SYR 20ML LL LF (SYRINGE) ×4 IMPLANT
SYR 5ML LL (SYRINGE) ×2 IMPLANT
SYR CONTROL 10ML LL (SYRINGE) ×2 IMPLANT
TOWEL GREEN STERILE (TOWEL DISPOSABLE) ×2 IMPLANT
TOWEL GREEN STERILE FF (TOWEL DISPOSABLE) ×2 IMPLANT
UNDERPAD 30X36 HEAVY ABSORB (UNDERPADS AND DIAPERS) ×2 IMPLANT
WATER STERILE IRR 1000ML POUR (IV SOLUTION) ×2 IMPLANT
WIRE AMPLATZ SS-J .035X180CM (WIRE) IMPLANT
WIRE BENTSON .035X145CM (WIRE) IMPLANT
WIRE STIFF LUNDERQUIST 260CM (WIRE) IMPLANT

## 2024-01-11 NOTE — Progress Notes (Signed)
 Patient ID: Sean [name], male   DOB: [birth year], 53 y.o.   MRN: [id number] [facility] Progress Note   Assessment/ Plan:   1.  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  He is willing to start dialysis and was seen earlier by vascular surgery and is on the schedule for [facility] with likely left BCF creation today.  He will have his first dialysis treatment done today and a second tomorrow.  We will begin the process for outpatient dialysis unit placement. 2.  Hypertension: Blood pressures currently elevated, monitor with dialysis. 3.  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  Monitor for bleeding diathesis. 4.  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  Phosphorus level at goal yesterday.  Subjective:   After a rather uneventful day, had some nausea/vomiting overnight.  Currently n.p.o. for access surgery.   Objective:   BP 136/67 (BP Location: Left Arm)   Pulse 81   Temp (!) 97.3 F (36.3 C) (Oral)   Resp 18   Ht 5\' 11"  (1.803 m)   Wt (!) 176.3 kg   SpO2 91%   BMI 54.21 kg/m   Intake/Output Summary (Last 24 hours) at 01/11/2024 9629 Last data filed at 01/10/2024 2300 Gross per 24 hour  Intake --  Output 750 ml  Net -750 ml   Weight change: -2.5 kg  Physical Exam: Gen: Obese man, comfortably sitting on the edge of his bed, mother and aunt at bedside CVS: Regular rhythm, normal rate S1 and S2  Resp: Diminished breath sounds over bases, no rales/rhonchi Abd: Soft, obese, nontender, bowel sounds normal Ext: 2+ bilateral pitting edema  Imaging: VAS US  UPPER EXT VEIN MAPPING (PRE-OP  AVF) Result Date: 01/10/2024 UPPER EXTREMITY VEIN  MAPPING Patient Name:  Sean [name] [facility]  Date of Exam:   01/09/2024 Medical Rec #: [id number]            Accession #:    [id number] Date of Birth: [birth year]            Patient Gender: M Patient Age:   68 years Exam Location:  [facility] Procedure:      VAS US  UPPER EXT VEIN MAPPING (PRE-OP  AVF) Referring Phys: Green Quincy --------------------------------------------------------------------------------  Indications: Pre-access. History: CKD /ESRD.  Comparison Study: No prior study on file Performing Technologist: Carleene Chase RVS  Examination Guidelines: A complete evaluation includes B-mode imaging, spectral Doppler, color Doppler, and power Doppler as needed of all accessible portions of each vessel. Bilateral testing is considered an integral part of a complete examination. Limited examinations for reoccurring indications may be performed as noted. +-----------------+-------------+----------+---------+ Right Cephalic   Diameter (cm)Depth (cm)Findings  +-----------------+-------------+----------+---------+ Prox upper arm       0.47        0.63             +-----------------+-------------+----------+---------+ Mid upper arm        0.47        0.41             +-----------------+-------------+----------+---------+ Dist upper arm       0.51        0.43             +-----------------+-------------+----------+---------+ Antecubital fossa    0.60        0.31             +-----------------+-------------+----------+---------+  Prox forearm       0.39/0.35  0.33/0.78 branching +-----------------+-------------+----------+---------+ Mid forearm          0.46        0.46             +-----------------+-------------+----------+---------+ Dist forearm         0.38        0.39             +-----------------+-------------+----------+---------+ Wrist                0.30        0.53             +-----------------+-------------+----------+---------+  +-----------------+-------------+----------+---------+ Right Basilic    Diameter (cm)Depth (cm)Findings  +-----------------+-------------+----------+---------+ Prox upper arm       0.65        2.11    origin   +-----------------+-------------+----------+---------+ Mid upper arm        0.52        1.18             +-----------------+-------------+----------+---------+ Dist upper arm       0.40        0.71             +-----------------+-------------+----------+---------+ Antecubital fossa    0.32        0.36             +-----------------+-------------+----------+---------+ Prox forearm         0.34        0.29             +-----------------+-------------+----------+---------+ Mid forearm        0.35/0.25  0.36/0.24 branching +-----------------+-------------+----------+---------+ Distal forearm       0.21        0.42             +-----------------+-------------+----------+---------+ Wrist                0.37        0.58             +-----------------+-------------+----------+---------+ +-----------------+-------------+----------+---------+ Left Cephalic    Diameter (cm)Depth (cm)Findings  +-----------------+-------------+----------+---------+ Prox upper arm       0.65        0.47             +-----------------+-------------+----------+---------+ Mid upper arm        0.48        0.55             +-----------------+-------------+----------+---------+ Dist upper arm       0.63        0.48             +-----------------+-------------+----------+---------+ Antecubital fossa    0.79        0.45             +-----------------+-------------+----------+---------+ Prox forearm       0.49/0.45  0.70/0.62 branching +-----------------+-------------+----------+---------+ Mid forearm          0.42        0.46             +-----------------+-------------+----------+---------+ Dist forearm         0.38        0.46              +-----------------+-------------+----------+---------+ Wrist                0.37        0.58             +-----------------+-------------+----------+---------+ +-----------------+-------------+----------+--------+  Left Basilic     Diameter (cm)Depth (cm)Findings +-----------------+-------------+----------+--------+ Prox upper arm       0.54        0.17    origin  +-----------------+-------------+----------+--------+ Mid upper arm        0.76        0.98            +-----------------+-------------+----------+--------+ Dist upper arm       0.42        0.97            +-----------------+-------------+----------+--------+ Antecubital fossa    0.57        1.22            +-----------------+-------------+----------+--------+ Prox forearm         0.40        0.55            +-----------------+-------------+----------+--------+ Mid forearm          0.21        0.40            +-----------------+-------------+----------+--------+ Distal forearm       0.15        0.25            +-----------------+-------------+----------+--------+ Wrist                0.17        0.38            +-----------------+-------------+----------+--------+ *See table(s) above for measurements and observations.  Diagnosing physician: Irvin Mantel Electronically signed by Irvin Mantel on 01/10/2024 at 10:26:10 AM.    Final    US  RENAL Result Date: 01/09/2024 CLINICAL DATA:  Acute kidney injury EXAM: RENAL / URINARY TRACT ULTRASOUND COMPLETE COMPARISON:  CT abdomen pelvis 11/02/2023 FINDINGS: Right Kidney: Not well visualized due to adjacent shadowing bowel gas. Left Kidney: Not well visualized due to adjacent shadowing bowel gas. Bladder: Appears normal for degree of bladder distention. Other: None. IMPRESSION: Nonvisualization of the kidneys due to patient body habitus and adjacent shadowing bowel gas. Electronically Signed   By: Elester Grim M.D.   On: 01/09/2024 12:44    Labs: BMET Recent  Labs  Lab 01/05/24 1238 01/08/24 1919 01/09/24 0639 01/10/24 0317  NA 142 145 144  --   K 3.9 3.5 3.7  --   CL 107 110 110  --   CO2 24 25 22   --   GLUCOSE 151* 107* 131*  --   BUN 57* 65* 65*  --   CREATININE 6.95* 7.17* 7.44*  --   CALCIUM  8.9 9.3 8.9  --   PHOS  --   --  5.5* 4.8*   CBC Recent Labs  Lab 01/05/24 1238 01/08/24 1919 01/09/24 0639  WBC 6.5 5.7 6.2  NEUTROABS 3.6  --  2.9  HGB 10.8* 11.2* 10.1*  HCT 32.8* 34.2* 30.5*  MCV 103.5* 103.0* 102.3*  PLT 66* 65* 64*    Medications:     allopurinol   100 mg Oral Daily   amitriptyline   25 mg Oral QHS   calcium  acetate  667 mg Oral TID WC   Chlorhexidine  Gluconate Cloth  6 each Topical Q0600   empagliflozin   10 mg Oral Daily   famotidine   20 mg Oral BID   furosemide   80 mg Intravenous BID   metoCLOPramide  (REGLAN ) injection  10 mg Intravenous Once   montelukast   10 mg Oral Daily   pantoprazole   40 mg Oral BID    Clevester Dally, MD  01/11/2024, 8:26 AM

## 2024-01-11 NOTE — Progress Notes (Addendum)
  Progress Note    01/11/2024 7:27 AM * No surgery found *  Subjective:  no complaints, sleeping before I entered the room    Vitals:   01/10/24 2313 01/11/24 0445  BP:  130/67  Pulse: 73 63  Resp:  15  Temp:  97.8 F (36.6 C)  SpO2: 96% 91%    Physical Exam: General:  resting comfortably Lungs:  nonlabored  CBC    Component Value Date/Time   WBC 6.2 01/09/2024 0639   RBC 2.98 (L) 01/09/2024 0639   HGB 10.1 (L) 01/09/2024 0639   HGB 14.4 10/31/2020 0958   HCT 30.5 (L) 01/09/2024 0639   HCT 42.5 10/31/2020 0958   PLT 64 (L) 01/09/2024 0639   PLT 55 (LL) 05/04/2023 1119   MCV 102.3 (H) 01/09/2024 0639   MCV 99 (A) 03/24/2022 0000   MCH 33.9 01/09/2024 0639   MCHC 33.1 01/09/2024 0639   RDW 14.5 01/09/2024 0639   RDW 13.6 10/31/2020 0958   LYMPHSABS 1.6 01/09/2024 0639   LYMPHSABS 1.4 09/14/2019 1350   MONOABS 1.0 01/09/2024 0639   EOSABS 0.6 (H) 01/09/2024 0639   EOSABS 0.2 09/14/2019 1350   BASOSABS 0.1 01/09/2024 0639   BASOSABS 0.0 09/14/2019 1350    BMET    Component Value Date/Time   NA 144 01/09/2024 0639   NA 140 07/27/2023 0000   K 3.7 01/09/2024 0639   CL 110 01/09/2024 0639   CO2 22 01/09/2024 0639   GLUCOSE 131 (H) 01/09/2024 0639   BUN 65 (H) 01/09/2024 0639   BUN 45 (A) 07/27/2023 0000   CREATININE 7.44 (H) 01/09/2024 0639   CREATININE 3.02 (H) 04/03/2023 1609   CREATININE 1.95 (H) 12/13/2021 1808   CALCIUM  8.9 01/09/2024 0639   CALCIUM  9.1 12/23/2023 0000   GFRNONAA 8 (L) 01/09/2024 0639   GFRNONAA 24 (L) 04/03/2023 1609   GFRAA 57 (L) 10/31/2020 0958    INR    Component Value Date/Time   INR 1.1 06/26/2023 0736     Intake/Output Summary (Last 24 hours) at 01/11/2024 0727 Last data filed at 01/10/2024 2300 Gross per 24 hour  Intake --  Output 1150 ml  Net -1150 ml      Assessment/Plan:  53 y.o. male going to OR today   -Plan is for OR this morning for placement of TDC and creation of left arm AVF -Patient remains  agreeable to surgery. All questions answered  -He has been NPO since midnight   Deneise Finlay, [place] [facility] [phone] 01/11/2024 7:27 AM   VASCULAR STAFF ADDENDUM: I have independently interviewed and examined the patient. I agree with the above.  Left arm examined, IV removed from the forearm. Will need new IV and holding. Room air.  Will plan for left arm fistula creation, tunneled dialysis catheter placement. We discussed the risks and benefits of fistula creation, and from vein mapping, will attempt left radiocephalic if the vein remains suitable.  If not, we will move to brachiocephalic.  After discussing risks and benefits, [place] elected to proceed.  Kayla Part MD [facility] Phone Number: [phone] 01/11/2024 9:18 AM

## 2024-01-11 NOTE — Transfer of Care (Signed)
 Immediate Anesthesia Transfer of Care Note  Patient: Sean [name]  Procedure(s) Performed: LEFT RADIOCEPHALIC ARTERIOVENOUS (AV) FISTULA CREATION (Left: Arm Lower) INSERTION OF TUNNELED  DIALYSIS CATHETER RIGHT INTERNAL JUGULAR (Right: Neck)  Patient Location: PACU  Anesthesia Type:General  Level of Consciousness: awake  Airway & Oxygen Therapy: Patient Spontanous Breathing  Post-op Assessment: Report given to RN  Post vital signs: Reviewed and stable  Last Vitals:  Vitals Value Taken Time  BP 149/80 01/11/24 1320  Temp    Pulse 74 01/11/24 1328  Resp 24 01/11/24 1328  SpO2 94 % 01/11/24 1328  Vitals shown include unfiled device data.  Last Pain:  Vitals:   01/11/24 0951  TempSrc: Oral  PainSc: 0-No pain      Patients Stated Pain Goal: 0 (01/10/24 2341)  Complications: No notable events documented.

## 2024-01-11 NOTE — Progress Notes (Signed)
 Seen postop.  Doing well with CPAP. No bleeding from the [facility], no bleeding from the left fistula creation site.  No pneumothorax. Will see in AM.  Kayla Part MD

## 2024-01-11 NOTE — Anesthesia Preprocedure Evaluation (Addendum)
 Anesthesia Evaluation  Patient identified by MRN, date of birth, ID band Patient awake    Reviewed: Allergy & Precautions, NPO status , Patient's Chart, lab work & pertinent test results  Airway Mallampati: III  TM Distance: >3 FB Neck ROM: Full    Dental no notable dental hx.    Pulmonary asthma , sleep apnea and Continuous Positive Airway Pressure Ventilation    Pulmonary exam normal        Cardiovascular hypertension, Pt. on medications and Pt. on home beta blockers + CAD and + CABG  Normal cardiovascular exam+ dysrhythmias Atrial Fibrillation      Neuro/Psych negative neurological ROS  negative psych ROS   GI/Hepatic ,GERD  Medicated and Controlled,,(+) Cirrhosis         Endo/Other    Class 4 obesity  Renal/GU ESRFRenal disease     Musculoskeletal  (+) Arthritis ,    Abdominal  (+) + obese  Peds  Hematology  (+) Blood dyscrasia (Eliquis ), anemia Thrombocytopenia    Anesthesia Other Findings   Reproductive/Obstetrics                             Anesthesia Physical Anesthesia Plan  ASA: 4  Anesthesia Plan: General   Post-op Pain Management:    Induction: Intravenous  PONV Risk Score and Plan: 2 and Ondansetron , Dexamethasone , Midazolam  and Treatment may vary due to age or medical condition  Airway Management Planned: Oral ETT and Video Laryngoscope Planned  Additional Equipment:   Intra-op Plan:   Post-operative Plan: Extubation in OR  Informed Consent: I have reviewed the patients History and Physical, chart, labs and discussed the procedure including the risks, benefits and alternatives for the proposed anesthesia with the patient or authorized representative who has indicated his/her understanding and acceptance.       Plan Discussed with: CRNA  Anesthesia Plan Comments:        Anesthesia Quick Evaluation

## 2024-01-11 NOTE — Progress Notes (Signed)
 Requested to see pt for out-pt HD needs at d/c. Unable to meet with pt today due to pt being off the unit for a procedure. Will f/u with pt tomorrow morning.   Lauraine Polite Renal Navigator [phone]

## 2024-01-11 NOTE — Progress Notes (Signed)
 PROGRESS NOTE    Sean [name]  QQV:[id number] DOB: [birth year] DOA: 01/08/2024 PCP: Anthon Kins, MD    Brief Narrative:  Sean [name] is a 53 y.o. male with medical history significant of chronic kidney disease.  Patient has been medically managed with diuretics as well as Jardiance  as well as losartan  in the past.  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  Patient states that for approximately 1 or 2 months he has had intermittent episodes of nausea, poor appetite.  Patient is s/p evaluation by nephrology service Dr. Lydia Sams as well as vascular surgical team. Overall plan/impression is uremia with need to start inpatient dialysis after placement of fistula in the left upper extremity as well as tunneled hemodialysis catheter-- planned for 2/10.  Assessment and Plan: * Acute renal failure superimposed on stage 5 chronic kidney disease, not on chronic dialysis [facility]) -Appreciate nephrology evaluation.   -OR 2/10 for L arm Avf, TDC  -plan to start HD after  Asthma, chronic Not in exacerbation, continue with Singulair   Nausea and vomiting Exacerbated likely due to uremia or uremia -PRNs  Gout Chronic, continue with allopurinol   Atrial flutter with rapid ventricular response (HCC) - plan for the patient is to get dialysis initiated, -hold patient's apixaban  until ok to reumse by vascular  CAD (coronary artery disease) -resume meds as able  Hypertension -PRN agents  OSA -CPAP QHS  Obesity Estimated body mass index is 54.21 kg/m as calculated from the following:   Height as of this encounter: 5\' 11"  (1.803 m).   Weight as of this encounter: 176.3 kg.     DVT prophylaxis: SCDs Start: 01/09/24 0226    Code Status: Full Code Family Communication: wife at bedside  Disposition Plan:  Level of care: Progressive Status is: Inpatient     Consultants:  Renal vascular    Subjective: On side of bed-- talking on phone  Objective: Vitals:    01/10/24 2313 01/11/24 0445 01/11/24 0743 01/11/24 0951  BP:  130/67 136/67 (!) 172/81  Pulse: 73 63 81 71  Resp:  15 18 (!) 22  Temp:  97.8 F (36.6 C) (!) 97.3 F (36.3 C) (!) 97.5 F (36.4 C)  TempSrc:  Axillary Oral Oral  SpO2: 96% 91%  98%  Weight:  (!) 176.3 kg    Height:        Intake/Output Summary (Last 24 hours) at 01/11/2024 1148 Last data filed at 01/11/2024 1034 Gross per 24 hour  Intake 150 ml  Output 750 ml  Net -600 ml   Filed Weights   01/09/24 0206 01/10/24 0626 01/11/24 0445  Weight: (!) 178.3 kg (!) 178.8 kg (!) 176.3 kg    Examination:    General: Appearance:    Severely obese male in no acute distress     Lungs:     respirations unlabored  Heart:    Normal heart rate. Normal rhythm. No murmurs, rubs, or gallops.   MS:   All extremities are intact.   Neurologic:   Awake, alert, oriented x 3. No apparent focal neurological           defect.      Data Reviewed: I have personally reviewed following labs and imaging studies  CBC: Recent Labs  Lab 01/05/24 1238 01/08/24 1919 01/09/24 0639 01/11/24 0950  WBC 6.5 5.7 6.2  --   NEUTROABS 3.6  --  2.9  --   HGB 10.8* 11.2* 10.1* 11.9*  HCT 32.8* 34.2*  30.5* 35.0*  MCV 103.5* 103.0* 102.3*  --   PLT 66* 65* 64*  --    Basic Metabolic Panel: Recent Labs  Lab 01/05/24 1238 01/08/24 1919 01/09/24 0639 01/10/24 0317 01/11/24 0950  NA 142 145 144  --  144  K 3.9 3.5 3.7  --  3.9  CL 107 110 110  --  108  CO2 24 25 22   --   --   GLUCOSE 151* 107* 131*  --  124*  BUN 57* 65* 65*  --  59*  CREATININE 6.95* 7.17* 7.44*  --  8.50*  CALCIUM  8.9 9.3 8.9  --   --   MG 2.4  --  2.5*  --   --   PHOS  --   --  5.5* 4.8*  --    GFR: Estimated Creatinine Clearance: 16.8 mL/min (A) (by C-G formula based on SCr of 8.5 mg/dL (H)). Liver Function Tests: Recent Labs  Lab 01/05/24 1238 01/08/24 1919 01/09/24 0639  AST 27 19 21   ALT 26 19 21   ALKPHOS 140* 130* 115  BILITOT 0.7 0.6 0.9  PROT 5.9*  6.0* 5.6*  ALBUMIN  2.4* 2.8* 2.2*   Recent Labs  Lab 01/08/24 1919  LIPASE 126*   No results for input(s): "AMMONIA" in the last 168 hours. Coagulation Profile: No results for input(s): "INR", "PROTIME" in the last 168 hours. Cardiac Enzymes: Recent Labs  Lab 01/09/24 0639  CKTOTAL 96   BNP (last 3 results) No results for input(s): "PROBNP" in the last 8760 hours. HbA1C: No results for input(s): "HGBA1C" in the last 72 hours. CBG: No results for input(s): "GLUCAP" in the last 168 hours. Lipid Profile: No results for input(s): "CHOL", "HDL", "LDLCALC", "TRIG", "CHOLHDL", "LDLDIRECT" in the last 72 hours. Thyroid  Function Tests: No results for input(s): "TSH", "T4TOTAL", "FREET4", "T3FREE", "THYROIDAB" in the last 72 hours. Anemia Panel: No results for input(s): "VITAMINB12", "FOLATE", "FERRITIN", "TIBC", "IRON", "RETICCTPCT" in the last 72 hours. Sepsis Labs: No results for input(s): "PROCALCITON", "LATICACIDVEN" in the last 168 hours.  Recent Results (from the past 240 hours)  Resp panel by RT-PCR (RSV, Flu A&B, Covid) Anterior Nasal Swab     Status: None   Collection Time: 01/05/24 12:30 PM   Specimen: Anterior Nasal Swab  Result Value Ref Range Status   SARS Coronavirus 2 by RT PCR NEGATIVE NEGATIVE Final   Influenza A by PCR NEGATIVE NEGATIVE Final   Influenza B by PCR NEGATIVE NEGATIVE Final    Comment: (NOTE) The Xpert Xpress SARS-CoV-2/FLU/RSV plus assay is intended as an aid in the diagnosis of influenza from Nasopharyngeal swab specimens and should not be used as a sole basis for treatment. Nasal washings and aspirates are unacceptable for Xpert Xpress SARS-CoV-2/FLU/RSV testing.  Fact Sheet for Patients: BloggerCourse.com  Fact Sheet for Healthcare Providers: SeriousBroker.it  This test is not yet approved or cleared by the [place]  FDA and has been authorized for detection and/or diagnosis of  SARS-CoV-2 by FDA under an Emergency Use Authorization (EUA). This EUA will remain in effect (meaning this test can be used) for the duration of the COVID-19 declaration under Section 564(b)(1) of the Act, 21 U.S.C. section 360bbb-3(b)(1), unless the authorization is terminated or revoked.     Resp Syncytial Virus by PCR NEGATIVE NEGATIVE Final    Comment: (NOTE) Fact Sheet for Patients: BloggerCourse.com  Fact Sheet for Healthcare Providers: SeriousBroker.it  This test is not yet approved or cleared by the [place]  FDA and has been  authorized for detection and/or diagnosis of SARS-CoV-2 by FDA under an Emergency Use Authorization (EUA). This EUA will remain in effect (meaning this test can be used) for the duration of the COVID-19 declaration under Section 564(b)(1) of the Act, 21 U.S.C. section 360bbb-3(b)(1), unless the authorization is terminated or revoked.  Performed at [facility], [address], [place], [place] [address]   Surgical pcr screen     Status: None   Collection Time: 01/11/24  4:51 AM   Specimen: Nasal Mucosa; Nasal Swab  Result Value Ref Range Status   MRSA, PCR NEGATIVE NEGATIVE Final   Staphylococcus aureus NEGATIVE NEGATIVE Final    Comment: (NOTE) The Xpert SA Assay (FDA approved for NASAL specimens in patients 72 years of age and older), is one component of a comprehensive surveillance program. It is not intended to diagnose infection nor to guide or monitor treatment. Performed at [facility], [address] [place], [place] [address]          Radiology Studies: No results found.       Scheduled Meds:  [MAR Hold] allopurinol   100 mg Oral Daily   [MAR Hold] amitriptyline   25 mg Oral QHS   [MAR Hold] calcium  acetate  667 mg Oral TID WC   ceFAZolin        [MAR Hold] Chlorhexidine  Gluconate Cloth  6 each Topical Q0600   [MAR Hold] empagliflozin   10 mg Oral Daily    [MAR Hold] famotidine   20 mg Oral BID   furosemide   80 mg Intravenous BID   [MAR Hold] metoCLOPramide  (REGLAN ) injection  10 mg Intravenous Once   [MAR Hold] montelukast   10 mg Oral Daily   [MAR Hold] pantoprazole   40 mg Oral BID   Continuous Infusions:  [MAR Hold] promethazine  (PHENERGAN ) injection (IM or IVPB) Stopped (01/09/24 2303)     LOS: 2 days    Time spent: 45 minutes spent on chart review, discussion with nursing staff, consultants, updating family and interview/physical exam; more than 50% of that time was spent in counseling and/or coordination of care.    Enrigue Harvard, DO [facility] Available via Epic secure chat 7am-7pm After these hours, please refer to coverage provider listed on [email or web address] 01/11/2024, 11:48 AM

## 2024-01-11 NOTE — Anesthesia Postprocedure Evaluation (Signed)
 Anesthesia Post Note  Patient: Najae [place] Esco  Procedure(s) Performed: LEFT RADIOCEPHALIC ARTERIOVENOUS (AV) FISTULA CREATION (Left: Arm Lower) INSERTION OF TUNNELED  DIALYSIS CATHETER RIGHT INTERNAL JUGULAR (Right: Neck)     Patient location during evaluation: PACU Anesthesia Type: General Level of consciousness: sedated, patient cooperative and oriented Pain management: pain level controlled Vital Signs Assessment: post-procedure vital signs reviewed and stable Respiratory status: spontaneous breathing, nonlabored ventilation and respiratory function stable (requiring CPAP (uses CPAP at home)) Cardiovascular status: blood pressure returned to baseline and stable Postop Assessment: no apparent nausea or vomiting Anesthetic complications: no   No notable events documented.  Last Vitals:  Vitals:   01/11/24 1329 01/11/24 1345  BP:  139/81  Pulse: 74   Resp: (!) 24   Temp:    SpO2: 94%     Last Pain:  Vitals:   01/11/24 1345  TempSrc:   PainSc: 0-No pain                 Makaiya Geerdes,E. Fredrick Geoghegan

## 2024-01-11 NOTE — Op Note (Signed)
 NAME: Sean [name]    MRN: [id number] DOB: [birth year]    DATE OF OPERATION: 01/11/2024  PREOP DIAGNOSIS:    End stage renal disease requiring dialysis  POSTOP DIAGNOSIS:    Same  PROCEDURE:    Right internal jugular tunneled dialysis catheter placement-23 cm palindrome Left arm radiocephalic fistula creation  SURGEON: Kayla Part  ASSIST: Cordie Deters, PA  ANESTHESIA: General  EBL: 50ml  INDICATIONS:    Sean [name] is a 53 y.o. male who presented to the hospital over the weekend with fluid overload, and acute on chronic kidney injury.  Vascular surgery was called by nephrology for permanent HD access.  FINDINGS:   Patent right internal jugular vein 3.5 mm cephalic vein at the level of the wrist, 3 mm radial artery  TECHNIQUE:   Using ultrasound guidance the left internal jugular vein was accessed with micropuncture technique.  Through the micropuncture sheath a floppy J-wire was advanced into the superior vena cava.  A small incision was made around the skin access point.  A counterincision was made in the chest under the clavicle.  A 19 cm tunneled dialysis catheter was then tunneled under the skin, over the clavicle into the incision in the neck.  The access point was serially dilated under direct fluoroscopic guidance.  A peel-away sheath was introduced into the superior vena cava under fluoroscopic guidance.  The tunneling device was removed and the catheter fed through the peel-away sheath into the superior vena cava.  The peel-away sheath was removed and the catheter gently pulled back.  X-ray was utilized to confirm adequate position.  Interestingly, there was a kink in the catheter due to the patient's morbid obesity, the kink did not improve with catheter manipulation.  I elected to attempt catheter exchange but lost wire access.  I repeated the above procedure through the incision already created incision in the right neck using a 23 cm tunneled  dialysis catheter, with follow-up x-ray demonstrating an excellent result with no kinking and adequate position at the atriocaval junction.  Next, I moved to left arm AV fistula creation.  An ultrasound was brought to the field and the left arm was insonated demonstrating a large, 3.5 mm cephalic vein at the level of the wrist with a 3 mm radial artery.  The cephalic vein was quite large, 8 mm, and I was afraid that using this for access would likely result in steal syndrome.  I elected to stay at the level of the wrist.  The cephalic vein and radial artery were marked.  A longitudinal incision was made between the 2, and the cephalic vein and radial artery were exposed in standard fashion.  Branches were ligated from the cephalic vein, and the cephalic vein was ligated distally.  Coronary dilators were used up to 4 mm.  The patient was given a small amount of heparin , the artery was controlled with Potts Vesseloops and open wound using an 11 blade.  The vein was sewn end-to-side to the radial artery using 6-0 running Prolene suture.  The artery was backbled prior to completion.  At completion, there was an excellent signal in the proximal radial artery with depressed signal distally.  There was an excellent signal in the fistula.  The ulnar artery had a triphasic signal.  The wound bed was irrigated with copious amounts of saline, and closed using Vicryl with Monocryl and Dermabond at the level of the skin.   Kayla Part, MD [facility] [place] DATE OF DICTATION:   01/11/2024

## 2024-01-12 ENCOUNTER — Encounter (HOSPITAL_COMMUNITY): Payer: Self-pay | Admitting: Vascular Surgery

## 2024-01-12 DIAGNOSIS — N185 Chronic kidney disease, stage 5: Secondary | ICD-10-CM | POA: Diagnosis not present

## 2024-01-12 DIAGNOSIS — N179 Acute kidney failure, unspecified: Secondary | ICD-10-CM | POA: Diagnosis not present

## 2024-01-12 DIAGNOSIS — R1110 Vomiting, unspecified: Secondary | ICD-10-CM | POA: Diagnosis not present

## 2024-01-12 LAB — RENAL FUNCTION PANEL
Albumin: 2.5 g/dL — ABNORMAL LOW (ref 3.5–5.0)
Anion gap: 12 (ref 5–15)
BUN: 64 mg/dL — ABNORMAL HIGH (ref 6–20)
CO2: 23 mmol/L (ref 22–32)
Calcium: 9 mg/dL (ref 8.9–10.3)
Chloride: 106 mmol/L (ref 98–111)
Creatinine, Ser: 7.57 mg/dL — ABNORMAL HIGH (ref 0.61–1.24)
GFR, Estimated: 8 mL/min — ABNORMAL LOW (ref >60)
Glucose, Bld: 111 mg/dL — ABNORMAL HIGH (ref 70–99)
Phosphorus: 4.6 mg/dL (ref 2.5–4.6)
Potassium: 3.5 mmol/L (ref 3.5–5.1)
Sodium: 141 mmol/L (ref 135–145)

## 2024-01-12 LAB — CBC
HCT: 33.9 % — ABNORMAL LOW (ref 39.0–52.0)
Hemoglobin: 11 g/dL — ABNORMAL LOW (ref 13.0–17.0)
MCH: 33.2 pg (ref 26.0–34.0)
MCHC: 32.4 g/dL (ref 30.0–36.0)
MCV: 102.4 fL — ABNORMAL HIGH (ref 80.0–100.0)
Platelets: 73 10*3/uL — ABNORMAL LOW (ref 150–400)
RBC: 3.31 MIL/uL — ABNORMAL LOW (ref 4.22–5.81)
RDW: 14.2 % (ref 11.5–15.5)
WBC: 10.5 10*3/uL (ref 4.0–10.5)
nRBC: 0 % (ref 0.0–0.2)

## 2024-01-12 LAB — HEPATITIS B SURFACE ANTIBODY, QUANTITATIVE: Hep B S AB Quant (Post): 12.1 m[IU]/mL

## 2024-01-12 MED ORDER — SENNA 8.6 MG PO TABS
1.0000 | ORAL_TABLET | Freq: Every day | ORAL | Status: DC | PRN
Start: 2024-01-12 — End: 2024-01-15
  Administered 2024-01-13 – 2024-01-14 (×2): 8.6 mg via ORAL
  Filled 2024-01-12 (×2): qty 1

## 2024-01-12 MED ORDER — APIXABAN 5 MG PO TABS
5.0000 mg | ORAL_TABLET | Freq: Two times a day (BID) | ORAL | Status: DC
  Administered 2024-01-12 – 2024-01-16 (×9): 5 mg via ORAL
  Filled 2024-01-12 (×9): qty 1

## 2024-01-12 MED ORDER — FAMOTIDINE 20 MG PO TABS
20.0000 mg | ORAL_TABLET | Freq: Every day | ORAL | Status: DC
  Administered 2024-01-13 – 2024-01-16 (×4): 20 mg via ORAL
  Filled 2024-01-12 (×4): qty 1

## 2024-01-12 NOTE — Progress Notes (Signed)
Met with pt, pt's mom, and aunt at bedside this morning. Introduced self and explained role. Discussed out-pt HD options. Pt prefers to stay with Dr Allena Katz if possible. Therefore, pt is interested in [facility]. Referral submitted to [facility] admissions for review. Pt's family plans to assist with transportation to out-pt HD appointments until it is known if pt can drive self. Will assist as needed.   Olivia Canter Renal Navigator [phone]

## 2024-01-12 NOTE — Progress Notes (Signed)
CHG was discontinued because pt is allergic to this product.

## 2024-01-12 NOTE — Discharge Instructions (Addendum)
[facility]  Discharge Instructions  AV Fistula or Graft Surgery for Dialysis Access  Please refer to the following instructions for your post-procedure care. Your surgeon or physician assistant will discuss any changes with you.  Activity  You may drive the day following your surgery, if you are comfortable and no longer taking prescription pain medication. Resume full activity as the soreness in your incision resolves.  Bathing/Showering  You may shower after you go home. Keep your incision dry for 48 hours. Do not soak in a bathtub, hot tub, or swim until the incision heals completely. You may not shower if you have a hemodialysis catheter.  Incision Care  Clean your incision with mild soap and water after 48 hours. Pat the area dry with a clean towel. You do not need a bandage unless otherwise instructed. Do not apply any ointments or creams to your incision. You may have skin glue on your incision. Do not peel it off. It will come off on its own in about one week. Your arm may swell a bit after surgery. To reduce swelling use pillows to elevate your arm so it is above your heart. Your doctor will tell you if you need to lightly wrap your arm with an ACE bandage.  Diet  Resume your normal diet. There are not special food restrictions following this procedure. In order to heal from your surgery, it is CRITICAL to get adequate nutrition. Your body requires vitamins, minerals, and protein. Vegetables are the best source of vitamins and minerals. Vegetables also provide the perfect balance of protein. Processed food has little nutritional value, so try to avoid this.  Medications  Resume taking all of your medications. If your incision is causing pain, you may take over-the counter pain relievers such as acetaminophen (Tylenol). If you were prescribed a stronger pain medication, please be aware these medications can cause nausea and constipation. Prevent  nausea by taking the medication with a snack or meal. Avoid constipation by drinking plenty of fluids and eating foods with high amount of fiber, such as fruits, vegetables, and grains.  Do not take Tylenol if you are taking prescription pain medications.  Follow up Your surgeon may want to see you in the office following your access surgery. If so, this will be arranged at the time of your surgery.  Please call us immediately for any of the following conditions:  Increased pain, redness, drainage (pus) from your incision site Fever of 101 degrees or higher Severe or worsening pain at your incision site Hand pain or numbness.  Reduce your risk of vascular disease:  Stop smoking. If you would like help, call [facility] at 1-800-QUIT-NOW (917-450-4812) or [place] at (779) 474-6237  Manage your cholesterol Maintain a desired weight Control your diabetes Keep your blood pressure down  Dialysis  It will take several weeks to several months for your new dialysis access to be ready for use. Your surgeon will determine when it is okay to use it. Your nephrologist will continue to direct your dialysis. You can continue to use your Permcath until your new access is ready for use.   01/12/2024 Sean [name] [id number] 07/22/1971  Surgeon(s): Victorino Sparrow, MD  Procedure(s): LEFT RADIOCEPHALIC ARTERIOVENOUS (AV) FISTULA CREATION INSERTION OF TUNNELED  DIALYSIS CATHETER RIGHT INTERNAL JUGULAR   May stick graft immediately   May stick graft on designated area only:   x Do not stick fistula for 12 weeks    If you  have any questions, please call the office at [phone].   Information on my medicine - ELIQUIS (apixaban)  This medication education was reviewed with me or my healthcare representative as part of my discharge preparation.    Why was Eliquis prescribed for you? Eliquis was prescribed for you to reduce the risk of a blood clot forming that can cause a  stroke if you have a medical condition called atrial fibrillation (a type of irregular heartbeat).  What do You need to know about Eliquis ? Take your Eliquis TWICE DAILY - one tablet in the morning and one tablet in the evening with or without food. If you have difficulty swallowing the tablet whole please discuss with your pharmacist how to take the medication safely.  Take Eliquis exactly as prescribed by your doctor and DO NOT stop taking Eliquis without talking to the doctor who prescribed the medication.  Stopping may increase your risk of developing a stroke.  Refill your prescription before you run out.  After discharge, you should have regular check-up appointments with your healthcare provider that is prescribing your Eliquis.  In the future your dose may need to be changed if your kidney function or weight changes by a significant amount or as you get older.  What do you do if you miss a dose? If you miss a dose, take it as soon as you remember on the same day and resume taking twice daily.  Do not take more than one dose of ELIQUIS at the same time to make up a missed dose.  Important Safety Information A possible side effect of Eliquis is bleeding. You should call your healthcare provider right away if you experience any of the following: Bleeding from an injury or your nose that does not stop. Unusual colored urine (red or dark brown) or unusual colored stools (red or black). Unusual bruising for unknown reasons. A serious fall or if you hit your head (even if there is no bleeding).  Some medicines may interact with Eliquis and might increase your risk of bleeding or clotting while on Eliquis. To help avoid this, consult your healthcare provider or pharmacist prior to using any new prescription or non-prescription medications, including herbals, vitamins, non-steroidal anti-inflammatory drugs (NSAIDs) and supplements.  This website has more information on Eliquis  (apixaban): http://www.eliquis.com/eliquis/home

## 2024-01-12 NOTE — Progress Notes (Signed)
Patient ID: Sean [name], male   DOB: [birth year], 53 y.o.   MRN: [id number] [facility] Progress Note   Assessment/ Plan:   1.  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  He is willing to start dialysis and was seen earlier by vascular surgery and is on the schedule for [facility] with likely left BCF creation today.  He will have his first dialysis treatment done today and a second tomorrow.  We will begin the process for outpatient dialysis unit placement. - HD #1 01/11/24 - HD #2 01/12/24 - HD #3 for 01/14/24 2.  Hypertension: Blood pressures currently elevated, monitor with dialysis. 3.  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  Monitor for bleeding diathesis. 4.  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  Phosphorus level at goal yesterday.  Subjective:    S/p TDC and AVF.  HD #1 yesterday, went well.  For HD #2 today.     Objective:   BP 130/64 (BP Location: Right Arm)   Pulse (!) 57   Temp 97.7 F (36.5 C) (Oral)   Resp 20   Ht 5\' 11"  (1.803 m)   Wt (!) 176.2 kg   SpO2 97%   BMI 54.18 kg/m   Intake/Output Summary (Last 24 hours) at 01/12/2024 1038 Last data filed at 01/11/2024 1859 Gross per 24 hour  Intake 700 ml  Output 1410 ml  Net -710 ml   Weight change: 1.9 kg  Physical Exam: Gen: Obese man, comfortably sitting on the edge of his bed, mother and aunt at bedside CVS: Regular rhythm, normal rate S1 and S2  Resp: Diminished breath sounds over bases, no rales/rhonchi Abd: Soft, obese, nontender, bowel sounds normal Ext: 2+ bilateral pitting edema  Imaging: DG CHEST PORT 1 VIEW Result Date: 01/11/2024 CLINICAL DATA:  Vascular  dialysis catheter placement. EXAM: PORTABLE CHEST 1 VIEW COMPARISON:  01/08/2024 FINDINGS: Catheter placed from a right internal jugular approach. Tip is either at the SVC RA junction or at the proximal right atrium. No pneumothorax. IMPRESSION: Catheter placed from a right internal jugular approach. Tip is either at the SVC RA junction or at the proximal right atrium. No pneumothorax. Electronically Signed   By: Paulina Fusi M.D.   On: 01/11/2024 17:33   DG C-Arm 1-60 Min Result Date: 01/11/2024 CLINICAL DATA:  Tunneled dialysis catheter insertion EXAM: DG C-ARM 1-60 MIN COMPARISON:  None Available. FINDINGS: 2 fluoroscopic images are obtained during performance of the procedure and are provided for interpretation only. Right-sided dialysis catheter is identified via internal jugular approach, tip overlying atriocaval junction. Please refer to operative report. Fluoroscopy time: 4 minutes 17 seconds, 59.34 mGy IMPRESSION: 1. Intraoperative exam as above. Electronically Signed   By: Sharlet Salina M.D.   On: 01/11/2024 15:31    Labs: BMET Recent Labs  Lab 01/05/24 1238 01/08/24 1919 01/09/24 0639 01/10/24 0317 01/11/24 0950  NA 142 145 144  --  144  K 3.9 3.5 3.7  --  3.9  CL 107 110 110  --  108  CO2 24 25 22   --   --   GLUCOSE 151* 107* 131*  --  124*  BUN 57* 65* 65*  --  59*  CREATININE 6.95* 7.17* 7.44*  --  8.50*  CALCIUM 8.9 9.3 8.9  --   --  PHOS  --   --  5.5* 4.8*  --    CBC Recent Labs  Lab 01/05/24 1238 01/08/24 1919 01/09/24 0639 01/11/24 0950  WBC 6.5 5.7 6.2  --   NEUTROABS 3.6  --  2.9  --   HGB 10.8* 11.2* 10.1* 11.9*  HCT 32.8* 34.2* 30.5* 35.0*  MCV 103.5* 103.0* 102.3*  --   PLT 66* 65* 64*  --     Medications:     allopurinol  100 mg Oral Daily   amitriptyline  25 mg Oral QHS   apixaban  5 mg Oral BID   calcium acetate  667 mg Oral TID WC   empagliflozin  10 mg Oral Daily   famotidine  20 mg Oral BID   metoCLOPramide (REGLAN) injection  10 mg  Intravenous Once   montelukast  10 mg Oral Daily   pantoprazole  40 mg Oral BID    Bufford Buttner, MD 01/12/2024, 10:38 AM

## 2024-01-12 NOTE — Plan of Care (Signed)

## 2024-01-12 NOTE — Progress Notes (Addendum)
Progress Note    01/12/2024 7:33 AM 1 Day Post-Op  Subjective:  says he feels a little sore at his [facility] entrance site. Denies any pain, numbness, or weakness in the left hand    Vitals:   01/12/24 0458 01/12/24 0459  BP:  (!) 149/71  Pulse:  70  Resp:  18  Temp: (!) 97.5 F (36.4 C) (!) 97.5 F (36.4 C)  SpO2:  96%    Physical Exam: General:  sitting up in bed, NAD Cardiac:  regular Lungs:  nonlabored Incisions: right TDC entrance site with dried bloody bandage from last night, new dry dressing placed. Left wrist incision intact and dry, small amount of bruising at the wrist Extremities:  palpable left radial pulse. Palpable thrill in left RC fistula. Intact motor and sensation of LUE  CBC    Component Value Date/Time   WBC 6.2 01/09/2024 0639   RBC 2.98 (L) 01/09/2024 0639   HGB 11.9 (L) 01/11/2024 0950   HGB 14.4 10/31/2020 0958   HCT 35.0 (L) 01/11/2024 0950   HCT 42.5 10/31/2020 0958   PLT 64 (L) 01/09/2024 0639   PLT 55 (LL) 05/04/2023 1119   MCV 102.3 (H) 01/09/2024 0639   MCV 99 (A) 03/24/2022 0000   MCH 33.9 01/09/2024 0639   MCHC 33.1 01/09/2024 0639   RDW 14.5 01/09/2024 0639   RDW 13.6 10/31/2020 0958   LYMPHSABS 1.6 01/09/2024 0639   LYMPHSABS 1.4 09/14/2019 1350   MONOABS 1.0 01/09/2024 0639   EOSABS 0.6 (H) 01/09/2024 0639   EOSABS 0.2 09/14/2019 1350   BASOSABS 0.1 01/09/2024 0639   BASOSABS 0.0 09/14/2019 1350    BMET    Component Value Date/Time   NA 144 01/11/2024 0950   NA 140 07/27/2023 0000   K 3.9 01/11/2024 0950   CL 108 01/11/2024 0950   CO2 22 01/09/2024 0639   GLUCOSE 124 (H) 01/11/2024 0950   BUN 59 (H) 01/11/2024 0950   BUN 45 (A) 07/27/2023 0000   CREATININE 8.50 (H) 01/11/2024 0950   CREATININE 3.02 (H) 04/03/2023 1609   CREATININE 1.95 (H) 12/13/2021 1808   CALCIUM 8.9 01/09/2024 0639   CALCIUM 9.1 12/23/2023 0000   GFRNONAA 8 (L) 01/09/2024 0639   GFRNONAA 24 (L) 04/03/2023 1609   GFRAA 57 (L) 10/31/2020 0958     INR    Component Value Date/Time   INR 1.1 06/26/2023 0736     Intake/Output Summary (Last 24 hours) at 01/12/2024 0733 Last data filed at 01/11/2024 1859 Gross per 24 hour  Intake 850 ml  Output 1410 ml  Net -560 ml      Assessment/Plan:  53 y.o. male is 1 day post op, s/p: placement of right internal jugular TDC and creation of left radiocephalic fistula    -Says he is feeling so much better this morning after getting dialysis. He no longer has any N/V -Endorses a little bit of soreness at his incision sites. They are intact without hematoma -Right internal jugular TDC in place. There was a dried bloody bandage at his [facility] site. There was no active bleeding. I placed a new dry gauze -Left wrist incision is intact and dry. There is mild bruising at the wrist. He has a palpable radial pulse without any steal symptoms. His left radiocephalic fistula has a great thrill in it -His TDC should be used for dialysis for the next couple of months while his fistula matures. He is stable for d/c from a vascular standpoint  Loel Dubonnet, PA-C [facility] [phone] 01/12/2024 7:33 AM   VASCULAR STAFF ADDENDUM: I have independently interviewed and examined the patient. I agree with the above.    Victorino Sparrow MD [facility] Phone Number: [phone] 01/12/2024 8:14 AM

## 2024-01-12 NOTE — Progress Notes (Signed)
PROGRESS NOTE    Sean [name]  WGN:[id number] DOB: November 21, 1971 DOA: 01/08/2024 PCP: Lula Olszewski, MD    Brief Narrative:  Sean [name] is a 53 y.o. male with medical history significant of chronic kidney disease.  Patient has been medically managed with diuretics as well as Jardiance as well as losartan in the past.  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  Patient states that for approximately 1 or 2 months he has had intermittent episodes of nausea, poor appetite.  Patient is s/p evaluation by nephrology service Dr. Allena Katz as well as vascular surgical team. Overall plan/impression is uremia with need to start inpatient dialysis after placement of fistula in the left upper extremity as well as tunneled hemodialysis catheter-- planned for 2/10.   Assessment and Plan: * Acute renal failure superimposed on stage 5 chronic kidney disease, not on chronic dialysis [facility]) -Appreciate nephrology evaluation.   -OR 2/10 for L arm Avf, TDC  -plan to start HD - need CLIPPED -needs nutrition consult  Asthma, chronic Not in exacerbation, continue with Singulair  Nausea and vomiting Exacerbated likely due to uremia or uremia -PRNs  Gout Chronic, continue with allopurinol  Atrial flutter with rapid ventricular response (HCC) - plan for the patient is to get dialysis initiated, -hold patient's apixaban until ok to reumse by vascular  CAD (coronary artery disease) -resume meds as able  Hypertension -PRN agents  OSA -CPAP QHS  Obesity Estimated body mass index is 54.18 kg/m as calculated from the following:   Height as of this encounter: 5\' 11"  (1.803 m).   Weight as of this encounter: 176.2 kg.     DVT prophylaxis: SCDs Start: 01/09/24 0226 apixaban (ELIQUIS) tablet 5 mg    Code Status: Full Code Family Communication: no family at bedside  Disposition Plan:  Level of care: Progressive Status is: Inpatient     Consultants:   Renal vascular    Subjective: Likes to drink OJ  Objective: Vitals:   01/12/24 0036 01/12/24 0458 01/12/24 0459 01/12/24 0815  BP: (!) 132/55  (!) 149/71 130/64  Pulse: 60  70 (!) 57  Resp: 18  18 20   Temp: 98 F (36.7 C) (!) 97.5 F (36.4 C) (!) 97.5 F (36.4 C) 97.7 F (36.5 C)  TempSrc: Oral Oral Oral Oral  SpO2: 96%  96% 97%  Weight:   (!) 176.2 kg   Height:        Intake/Output Summary (Last 24 hours) at 01/12/2024 1151 Last data filed at 01/11/2024 1859 Gross per 24 hour  Intake 700 ml  Output 1410 ml  Net -710 ml   Filed Weights   01/11/24 1605 01/11/24 1859 01/12/24 0459  Weight: (S) (!) 178.2 kg (S) (!) 176.7 kg (!) 176.2 kg    Examination:     General: Appearance:    Severely obese male in no acute distress     Lungs:     respirations unlabored  Heart:    Bradycardic.    MS:   All extremities are intact.   Neurologic:   Awake, alert     Data Reviewed: I have personally reviewed following labs and imaging studies  CBC: Recent Labs  Lab 01/05/24 1238 01/08/24 1919 01/09/24 0639 01/11/24 0950  WBC 6.5 5.7 6.2  --   NEUTROABS 3.6  --  2.9  --   HGB 10.8* 11.2* 10.1* 11.9*  HCT 32.8* 34.2* 30.5* 35.0*  MCV 103.5* 103.0* 102.3*  --  PLT 66* 65* 64*  --    Basic Metabolic Panel: Recent Labs  Lab 01/05/24 1238 01/08/24 1919 01/09/24 0639 01/10/24 0317 01/11/24 0950  NA 142 145 144  --  144  K 3.9 3.5 3.7  --  3.9  CL 107 110 110  --  108  CO2 24 25 22   --   --   GLUCOSE 151* 107* 131*  --  124*  BUN 57* 65* 65*  --  59*  CREATININE 6.95* 7.17* 7.44*  --  8.50*  CALCIUM 8.9 9.3 8.9  --   --   MG 2.4  --  2.5*  --   --   PHOS  --   --  5.5* 4.8*  --    GFR: Estimated Creatinine Clearance: 16.8 mL/min (A) (by C-G formula based on SCr of 8.5 mg/dL (H)). Liver Function Tests: Recent Labs  Lab 01/05/24 1238 01/08/24 1919 01/09/24 0639  AST 27 19 21   ALT 26 19 21   ALKPHOS 140* 130* 115  BILITOT 0.7 0.6 0.9  PROT 5.9* 6.0*  5.6*  ALBUMIN 2.4* 2.8* 2.2*   Recent Labs  Lab 01/08/24 1919  LIPASE 126*   No results for input(s): "AMMONIA" in the last 168 hours. Coagulation Profile: No results for input(s): "INR", "PROTIME" in the last 168 hours. Cardiac Enzymes: Recent Labs  Lab 01/09/24 0639  CKTOTAL 96   BNP (last 3 results) No results for input(s): "PROBNP" in the last 8760 hours. HbA1C: No results for input(s): "HGBA1C" in the last 72 hours. CBG: No results for input(s): "GLUCAP" in the last 168 hours. Lipid Profile: No results for input(s): "CHOL", "HDL", "LDLCALC", "TRIG", "CHOLHDL", "LDLDIRECT" in the last 72 hours. Thyroid Function Tests: No results for input(s): "TSH", "T4TOTAL", "FREET4", "T3FREE", "THYROIDAB" in the last 72 hours. Anemia Panel: No results for input(s): "VITAMINB12", "FOLATE", "FERRITIN", "TIBC", "IRON", "RETICCTPCT" in the last 72 hours. Sepsis Labs: No results for input(s): "PROCALCITON", "LATICACIDVEN" in the last 168 hours.  Recent Results (from the past 240 hours)  Resp panel by RT-PCR (RSV, Flu A&B, Covid) Anterior Nasal Swab     Status: None   Collection Time: 01/05/24 12:30 PM   Specimen: Anterior Nasal Swab  Result Value Ref Range Status   SARS Coronavirus 2 by RT PCR NEGATIVE NEGATIVE Final   Influenza A by PCR NEGATIVE NEGATIVE Final   Influenza B by PCR NEGATIVE NEGATIVE Final    Comment: (NOTE) The Xpert Xpress SARS-CoV-2/FLU/RSV plus assay is intended as an aid in the diagnosis of influenza from Nasopharyngeal swab specimens and should not be used as a sole basis for treatment. Nasal washings and aspirates are unacceptable for Xpert Xpress SARS-CoV-2/FLU/RSV testing.  Fact Sheet for Patients: BloggerCourse.com  Fact Sheet for Healthcare Providers: SeriousBroker.it  This test is not yet approved or cleared by the [place] FDA and has been authorized for detection and/or diagnosis of SARS-CoV-2  by FDA under an Emergency Use Authorization (EUA). This EUA will remain in effect (meaning this test can be used) for the duration of the COVID-19 declaration under Section 564(b)(1) of the Act, 21 U.S.C. section 360bbb-3(b)(1), unless the authorization is terminated or revoked.     Resp Syncytial Virus by PCR NEGATIVE NEGATIVE Final    Comment: (NOTE) Fact Sheet for Patients: BloggerCourse.com  Fact Sheet for Healthcare Providers: SeriousBroker.it  This test is not yet approved or cleared by the [place] FDA and has been authorized for detection and/or diagnosis of SARS-CoV-2 by FDA under an  Emergency Use Authorization (EUA). This EUA will remain in effect (meaning this test can be used) for the duration of the COVID-19 declaration under Section 564(b)(1) of the Act, 21 U.S.C. section 360bbb-3(b)(1), unless the authorization is terminated or revoked.  Performed at [facility], [address] [place], [place] [address]   Surgical pcr screen     Status: None   Collection Time: 01/11/24  4:51 AM   Specimen: Nasal Mucosa; Nasal Swab  Result Value Ref Range Status   MRSA, PCR NEGATIVE NEGATIVE Final   Staphylococcus aureus NEGATIVE NEGATIVE Final    Comment: (NOTE) The Xpert SA Assay (FDA approved for NASAL specimens in patients 8 years of age and older), is one component of a comprehensive surveillance program. It is not intended to diagnose infection nor to guide or monitor treatment. Performed at [facility], [address] [place], [place] [address]          Radiology Studies: DG CHEST PORT 1 VIEW Result Date: 01/11/2024 CLINICAL DATA:  Vascular dialysis catheter placement. EXAM: PORTABLE CHEST 1 VIEW COMPARISON:  01/08/2024 FINDINGS: Catheter placed from a right internal jugular approach. Tip is either at the SVC RA junction or at the proximal right atrium. No pneumothorax. IMPRESSION: Catheter  placed from a right internal jugular approach. Tip is either at the SVC RA junction or at the proximal right atrium. No pneumothorax. Electronically Signed   By: Paulina Fusi M.D.   On: 01/11/2024 17:33   DG C-Arm 1-60 Min Result Date: 01/11/2024 CLINICAL DATA:  Tunneled dialysis catheter insertion EXAM: DG C-ARM 1-60 MIN COMPARISON:  None Available. FINDINGS: 2 fluoroscopic images are obtained during performance of the procedure and are provided for interpretation only. Right-sided dialysis catheter is identified via internal jugular approach, tip overlying atriocaval junction. Please refer to operative report. Fluoroscopy time: 4 minutes 17 seconds, 59.34 mGy IMPRESSION: 1. Intraoperative exam as above. Electronically Signed   By: Sharlet Salina M.D.   On: 01/11/2024 15:31         Scheduled Meds:  allopurinol  100 mg Oral Daily   amitriptyline  25 mg Oral QHS   apixaban  5 mg Oral BID   calcium acetate  667 mg Oral TID WC   empagliflozin  10 mg Oral Daily   famotidine  20 mg Oral BID   metoCLOPramide (REGLAN) injection  10 mg Intravenous Once   montelukast  10 mg Oral Daily   pantoprazole  40 mg Oral BID   Continuous Infusions:  promethazine (PHENERGAN) injection (IM or IVPB) Stopped (01/09/24 2303)     LOS: 3 days    Time spent: 45 minutes spent on chart review, discussion with nursing staff, consultants, updating family and interview/physical exam; more than 50% of that time was spent in counseling and/or coordination of care.    Joseph Art, DO [facility] Available via Epic secure chat 7am-7pm After these hours, please refer to coverage provider listed on [email or web address] 01/12/2024, 11:51 AM

## 2024-01-12 NOTE — Progress Notes (Signed)
Pt is s/p TDC 2/10. Ok to resume apixaban per Dr. Benjamine Mola.  Ulyses Southward, PharmD, BCIDP, AAHIVP, CPP Infectious Disease Pharmacist 01/12/2024 8:48 AM

## 2024-01-12 NOTE — Progress Notes (Signed)
   01/12/24 1900  Vitals  Temp 98.8 F (37.1 C)  Pulse Rate 77  Resp (!) 24  BP 130/66  SpO2 97 %  O2 Device Room Air  Post Treatment  Dialyzer Clearance Lightly streaked  Hemodialysis Intake (mL) 0 mL  Liters Processed 45  Fluid Removed (mL) 1000 mL  Tolerated HD Treatment Yes   Received patient in bed to unit.  Alert and oriented.  Informed consent signed and in chart.   TX duration:3hrs  Patient tolerated well.  Transported back to the room  Alert, without acute distress.  Hand-off given to patient's nurse.   Access used: [facility] Access issues: none  Total UF removed: 1L Medication(s) given: none   [facility]

## 2024-01-12 NOTE — Progress Notes (Signed)
Mobility Specialist Progress Note:    01/12/24 1134  Mobility  Activity Ambulated with assistance in hallway  Level of Assistance Standby assist, set-up cues, supervision of patient - no hands on  Assistive Device Other (Comment) (hand rails)  Distance Ambulated (ft) 500 ft  Activity Response Tolerated well  Mobility Referral Yes  Mobility visit 1 Mobility  Mobility Specialist Start Time (ACUTE ONLY) 0932  Mobility Specialist Stop Time (ACUTE ONLY) 0946  Mobility Specialist Time Calculation (min) (ACUTE ONLY) 14 min   Received pt seated EOB having no complaints and agreeable to mobility.c/o mild lower bacl pain during ambulation, otherwise asx. Returned to room w/o fault. Left seated EOB w/ call bell in reach and all needs met.   Thompson Grayer Mobility Specialist  Please contact vis Secure Chat or  Rehab Office [phone]

## 2024-01-13 ENCOUNTER — Inpatient Hospital Stay (HOSPITAL_COMMUNITY): Payer: BC Managed Care – PPO

## 2024-01-13 DIAGNOSIS — J4520 Mild intermittent asthma, uncomplicated: Secondary | ICD-10-CM | POA: Diagnosis not present

## 2024-01-13 DIAGNOSIS — N186 End stage renal disease: Secondary | ICD-10-CM | POA: Diagnosis not present

## 2024-01-13 DIAGNOSIS — Z992 Dependence on renal dialysis: Secondary | ICD-10-CM | POA: Insufficient documentation

## 2024-01-13 DIAGNOSIS — N179 Acute kidney failure, unspecified: Secondary | ICD-10-CM | POA: Diagnosis not present

## 2024-01-13 DIAGNOSIS — I4892 Unspecified atrial flutter: Secondary | ICD-10-CM

## 2024-01-13 DIAGNOSIS — I5032 Chronic diastolic (congestive) heart failure: Secondary | ICD-10-CM | POA: Insufficient documentation

## 2024-01-13 DIAGNOSIS — N2581 Secondary hyperparathyroidism of renal origin: Secondary | ICD-10-CM | POA: Insufficient documentation

## 2024-01-13 DIAGNOSIS — I10 Essential (primary) hypertension: Secondary | ICD-10-CM

## 2024-01-13 DIAGNOSIS — I2510 Atherosclerotic heart disease of native coronary artery without angina pectoris: Secondary | ICD-10-CM | POA: Diagnosis present

## 2024-01-13 DIAGNOSIS — E7800 Pure hypercholesterolemia, unspecified: Secondary | ICD-10-CM | POA: Insufficient documentation

## 2024-01-13 DIAGNOSIS — I120 Hypertensive chronic kidney disease with stage 5 chronic kidney disease or end stage renal disease: Secondary | ICD-10-CM | POA: Insufficient documentation

## 2024-01-13 DIAGNOSIS — M1A9XX0 Chronic gout, unspecified, without tophus (tophi): Secondary | ICD-10-CM

## 2024-01-13 DIAGNOSIS — I5042 Chronic combined systolic (congestive) and diastolic (congestive) heart failure: Secondary | ICD-10-CM | POA: Insufficient documentation

## 2024-01-13 DIAGNOSIS — M1030 Gout due to renal impairment, unspecified site: Secondary | ICD-10-CM | POA: Insufficient documentation

## 2024-01-13 LAB — CBC
HCT: 34.9 % — ABNORMAL LOW (ref 39.0–52.0)
Hemoglobin: 11.7 g/dL — ABNORMAL LOW (ref 13.0–17.0)
MCH: 34.3 pg — ABNORMAL HIGH (ref 26.0–34.0)
MCHC: 33.5 g/dL (ref 30.0–36.0)
MCV: 102.3 fL — ABNORMAL HIGH (ref 80.0–100.0)
Platelets: 70 10*3/uL — ABNORMAL LOW (ref 150–400)
RBC: 3.41 MIL/uL — ABNORMAL LOW (ref 4.22–5.81)
RDW: 14.6 % (ref 11.5–15.5)
WBC: 8.6 10*3/uL (ref 4.0–10.5)
nRBC: 0 % (ref 0.0–0.2)

## 2024-01-13 LAB — RENAL FUNCTION PANEL
Albumin: 2.7 g/dL — ABNORMAL LOW (ref 3.5–5.0)
Anion gap: 16 — ABNORMAL HIGH (ref 5–15)
BUN: 49 mg/dL — ABNORMAL HIGH (ref 6–20)
CO2: 27 mmol/L (ref 22–32)
Calcium: 9.2 mg/dL (ref 8.9–10.3)
Chloride: 98 mmol/L (ref 98–111)
Creatinine, Ser: 6.11 mg/dL — ABNORMAL HIGH (ref 0.61–1.24)
GFR, Estimated: 10 mL/min — ABNORMAL LOW (ref >60)
Glucose, Bld: 126 mg/dL — ABNORMAL HIGH (ref 70–99)
Phosphorus: 5.7 mg/dL — ABNORMAL HIGH (ref 2.5–4.6)
Potassium: 3.5 mmol/L (ref 3.5–5.1)
Sodium: 141 mmol/L (ref 135–145)

## 2024-01-13 MED ORDER — METHOCARBAMOL 500 MG PO TABS
500.0000 mg | ORAL_TABLET | Freq: Once | ORAL | Status: AC
  Administered 2024-01-13: 500 mg via ORAL
  Filled 2024-01-13: qty 1

## 2024-01-13 MED ORDER — ACETAMINOPHEN 325 MG PO TABS
650.0000 mg | ORAL_TABLET | Freq: Once | ORAL | Status: AC
  Administered 2024-01-13: 650 mg via ORAL

## 2024-01-13 MED ORDER — METHOCARBAMOL 500 MG PO TABS
500.0000 mg | ORAL_TABLET | Freq: Three times a day (TID) | ORAL | Status: DC | PRN
  Administered 2024-01-14 – 2024-01-16 (×5): 500 mg via ORAL
  Filled 2024-01-13 (×5): qty 1

## 2024-01-13 MED ORDER — RENA-VITE PO TABS
1.0000 | ORAL_TABLET | Freq: Every day | ORAL | Status: DC
  Administered 2024-01-13 – 2024-01-15 (×3): 1 via ORAL
  Filled 2024-01-13 (×3): qty 1

## 2024-01-13 MED ORDER — PROSOURCE PLUS PO LIQD
30.0000 mL | Freq: Two times a day (BID) | ORAL | Status: DC
  Administered 2024-01-13 – 2024-01-15 (×2): 30 mL via ORAL
  Filled 2024-01-13 (×3): qty 30

## 2024-01-13 NOTE — Progress Notes (Signed)
Patient ID: Sean [name], male   DOB: 1971-03-11, 53 y.o.   MRN: [id number] [facility] Progress Note   Assessment/ Plan:   1.  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  He is willing to start dialysis and was seen earlier by vascular surgery and is on the schedule for [facility] with likely left BCF creation today.  He will have his first dialysis treatment done today and a second tomorrow.  We will begin the process for outpatient dialysis unit placement. - HD #1 01/11/24 - HD #2 01/12/24 - HD #3 for 01/14/24 2.  Hypertension: Blood pressures currently elevated, monitor with dialysis. 3.  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  Monitor for bleeding diathesis. 4.  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  Phosphorus level at goal yesterday.  Subjective:    HD #2 yesterday, did well.  Working with dietician today.  For HD #3 tomorrow, CLIP in process.   Objective:   BP (!) 125/59 (BP Location: Right Arm)   Pulse 64   Temp 97.8 F (36.6 C) (Axillary)   Resp 16   Ht 5\' 11"  (1.803 m)   Wt (!) 175.3 kg   SpO2 97%   BMI 53.89 kg/m   Intake/Output Summary (Last 24 hours) at 01/13/2024 1117 Last data filed at 01/13/2024 0900 Gross per 24 hour  Intake 300 ml  Output 2000 ml  Net -1700 ml   Weight change: -2.1 kg  Physical Exam: Gen: Obese man, comfortably sitting on the edge of his bed, mother and aunt at bedside CVS: Regular rhythm, normal rate S1 and S2  Resp: Diminished breath sounds over bases, no rales/rhonchi Abd: Soft, obese, nontender, bowel sounds normal Ext: 2+ bilateral pitting edema  Imaging: DG CHEST PORT 1 VIEW Result Date:  01/11/2024 CLINICAL DATA:  Vascular dialysis catheter placement. EXAM: PORTABLE CHEST 1 VIEW COMPARISON:  01/08/2024 FINDINGS: Catheter placed from a right internal jugular approach. Tip is either at the SVC RA junction or at the proximal right atrium. No pneumothorax. IMPRESSION: Catheter placed from a right internal jugular approach. Tip is either at the SVC RA junction or at the proximal right atrium. No pneumothorax. Electronically Signed   By: Paulina Fusi M.D.   On: 01/11/2024 17:33   DG C-Arm 1-60 Min Result Date: 01/11/2024 CLINICAL DATA:  Tunneled dialysis catheter insertion EXAM: DG C-ARM 1-60 MIN COMPARISON:  None Available. FINDINGS: 2 fluoroscopic images are obtained during performance of the procedure and are provided for interpretation only. Right-sided dialysis catheter is identified via internal jugular approach, tip overlying atriocaval junction. Please refer to operative report. Fluoroscopy time: 4 minutes 17 seconds, 59.34 mGy IMPRESSION: 1. Intraoperative exam as above. Electronically Signed   By: Sharlet Salina M.D.   On: 01/11/2024 15:31    Labs: BMET Recent Labs  Lab 01/08/24 1919 01/09/24 0639 01/10/24 0317 01/11/24 0950 01/12/24 1545  NA 145 144  --  144 141  K 3.5 3.7  --  3.9 3.5  CL 110 110  --  108 106  CO2 25 22  --   --  23  GLUCOSE 107* 131*  --  124* 111*  BUN 65* 65*  --  59* 64*  CREATININE 7.17* 7.44*  --  8.50* 7.57*  CALCIUM 9.3 8.9  --   --  9.0  PHOS  --  5.5* 4.8*  --  4.6   CBC Recent Labs  Lab 01/08/24 1919 01/09/24 0639 01/11/24 0950 01/12/24 1545 01/13/24 0941  WBC 5.7 6.2  --  10.5 8.6  NEUTROABS  --  2.9  --   --   --   HGB 11.2* 10.1* 11.9* 11.0* 11.7*  HCT 34.2* 30.5* 35.0* 33.9* 34.9*  MCV 103.0* 102.3*  --  102.4* 102.3*  PLT 65* 64*  --  73* 70*    Medications:     allopurinol  100 mg Oral Daily   amitriptyline  25 mg Oral QHS   apixaban  5 mg Oral BID   calcium acetate  667 mg Oral TID WC   empagliflozin  10 mg Oral  Daily   famotidine  20 mg Oral Daily   metoCLOPramide (REGLAN) injection  10 mg Intravenous Once   montelukast  10 mg Oral Daily   pantoprazole  40 mg Oral BID    Bufford Buttner, MD 01/13/2024, 11:17 AM

## 2024-01-13 NOTE — Consult Note (Signed)
[facility] Liaison Consult Note   01/13/2024  Sean [name] 03-27-1971 [id number]  Insurance:   Primary Care Provider: Lula Olszewski, MD with [place], this provider is listed for the transition of care follow up appointments  and TOC follow up calls   [facility] Liaison rounding on unit for patient on high risk list.    The patient was screened for hospitalization with noted [facility] Liaison Readmission Risk Score:[id number]} risk score for unplanned readmission risk ***hospital admissions in 6 months.  The patient was assessed for potential Community Care Coordination service needs for post hospital transition for care coordination. Review of patient's electronic medical record reveals patient is ***.   Plan: [facility] Liaison will continue to follow progress and disposition to asess for post hospital community care coordination/management needs.  Referral request for community care coordination: ***   [facility] Community Care, Population Health does not replace or interfere with any arrangements made by the Inpatient Transition of Care team.   For questions contact:   Charlesetta Shanks, RN, BSN, CCM [place]  [facility] Liaison Direct Dial: [phone] or secure chat Email: [email or web address]

## 2024-01-13 NOTE — Progress Notes (Signed)
   01/13/24 1535  TOC Brief Assessment  Insurance and Status Reviewed  Patient has primary care physician Yes  Home environment has been reviewed home  Prior level of function: self  Prior/Current Home Services No current home services  Social Drivers of Health Review SDOH reviewed no interventions necessary  Readmission risk has been reviewed Yes  Transition of care needs no transition of care needs at this time    Pt admitted with ARF, with need to start Hemo dialysis. Renal navigator following for clipping to outpt HD chair. We will continue to monitor patient advancement through interdisciplinary progression rounds. If new patient transition needs arise, please place a TOC consult.

## 2024-01-13 NOTE — Progress Notes (Signed)
Initial Nutrition Assessment  DOCUMENTATION CODES:   Morbid obesity  INTERVENTION:   Provided verbal/written education on Renal Diet to pt and his mother. Encouraged pt to follow-up frequently with RD at outpatient dialysis. Focused initial education on adequate protein intake with protein source with each meal/snack, increased intake of fruits and vegetables, decreasing sodium and limiting added sugars in addition to compliance with iHD schedule, renal MVI and phosphorus binder as prescribed. 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   Add Renal MVI daily  Add 30 ml ProSource Plus BID, each supplement provides 100 kcals and 15 grams protein.   Recommend bowel regimen; last documented BM 2/04. Recommend scheduled bowel regimen  Continue Renal Diet for now, once at dry wt-recommend liberalizing fluid restriction to 1L plus UOP (~1.5 L)   NUTRITION DIAGNOSIS:   Food and nutrition related knowledge deficit related to  (new ESRD-iHD initiation) as evidenced by  (consulted for diet education).  GOAL:   Patient will meet greater than or equal to 90% of their needs   MONITOR:   PO intake, Supplement acceptance, Labs, Weight trends  REASON FOR ASSESSMENT:   Consult Diet education  ASSESSMENT:   53 yo male admitted with AKI on CKD 5 requiring initiation of iHD this admission. PMH includes CKD 4, CHF, HTN, cardiac cirrhosis, obesity, CAD s/p CABG, GERD, congenital heart defect, gout  2/10 OR for L arm AVF and TDC, First iHD 2/11 2nd iHD  Noted next iHD scheduled for 2/13. Noted net UF 1L yesterday, Of note, pt is oliguric-makes around 500 mL of urine per day currently  Pt alert and sitting in chair on visit today. Mother at beside. 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   Pt reports good appetite at present, feeling much  better post initiation of iHD. No current nausea. Pt reports tremors (thought he had Parkinson's) and fatigue as well prior to admission, likely related to uremia.   Prior to admission, pt reports intermittent nausea and poor appetite limiting po intake. Noted per MD notes, pt would vomit multiple times some days and other days would be fine. Pt also indicates he had been on GLP-1 for wt loss, reports recently on Mounjaro -dose was recently increased and he did not tolerate. Pt reports he would given himself injections on Sundays and then would be super sick on Monday with N/V. Encouraged pt to talk with MD and Outpatient RD at Dialysis regarding wt loss and GLP-1 if he decides to continue.   Pt reports UBW around 400, EDW not determined yet.Admit weight around 393 pounds, current wt 386 pounds (175.3 kg). Pt still with bilateral LE edema on exam  but improved per pt   Potassium 3.5->3.7->3.9->3.5 Phosphorus 5.5->4.8 ->4.6->5.7; noted pt started on PhosLo 667 mL with meals. Pt was educated on phosphorus binder; continue to monitor phosphorus trend.   Labs: sodium 141 (wdl) Meds: senna prn, jardiance   NUTRITION - FOCUSED PHYSICAL EXAM:  Flowsheet Row Most Recent Value  Orbital Region No depletion  Upper Arm Region No depletion  Thoracic and Lumbar Region No depletion  Buccal Region No depletion  Temple Region No depletion  Clavicle Bone Region No depletion  Clavicle and Acromion Bone Region No depletion  Scapular Bone Region No depletion  Dorsal Hand Unable to assess  Patellar Region Unable to assess  Anterior  Thigh Region Unable to assess  Posterior Calf Region Unable to assess  Edema (RD Assessment) Moderate  Hair Reviewed  Eyes Reviewed  Mouth Reviewed  Skin Reviewed  Nails Reviewed       Diet Order:   Diet Order             Diet renal with fluid restriction Fluid consistency: Thin  Diet effective now                   EDUCATION NEEDS:   Education needs have  been addressed  Skin:  Skin Assessment: Skin Integrity Issues: Skin Integrity Issues:: Incisions Incisions: chest, arm (new AVF creation)  Last BM:  2/4  Height:   Ht Readings from Last 1 Encounters:  01/09/24 5\' 11"  (1.803 m)    Weight:   Wt Readings from Last 1 Encounters:  01/13/24 (!) 175.3 kg    BMI:  Body mass index is 53.89 kg/m.  Estimated Nutritional Needs:   Kcal:  1950-2150 kcals  Protein:  120-140 g  Fluid:  1L plus UOP   Romelle Starcher MS, RDN, LDN, CNSC Registered Dietitian 3 Clinical Nutrition RD Inpatient Contact Info in Amion

## 2024-01-13 NOTE — Progress Notes (Signed)
PROGRESS NOTE    Sean [name]  WJX:[id number] DOB: 12/24/1970 DOA: 01/08/2024 PCP: Lula Olszewski, MD    Chief Complaint  Patient presents with   Chronic Kidney Disease    Brief Narrative:  Sean [name] is a 53 y.o. male with medical history significant of chronic kidney disease.  Patient has been medically managed with diuretics as well as Jardiance as well as losartan in the past.  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  Patient states that for approximately 1 or 2 months he has had intermittent episodes of nausea, poor appetite.  Patient is s/p evaluation by nephrology service Dr. Allena Katz as well as vascular surgical team. Overall plan/impression is uremia with need to start inpatient dialysis after placement of fistula in the left upper extremity as well as tunneled hemodialysis catheter-- planned for 2/10.     Assessment & Plan:   Principal Problem:   Acute renal failure superimposed on stage 5 chronic kidney disease, not on chronic dialysis [facility]) Active Problems:   Hypertension   CAD (coronary artery disease)   Atrial flutter with rapid ventricular response (HCC)   Gout   Nausea and vomiting   Asthma, chronic  #1 new end-stage renal disease with progressive chronic kidney disease stage V -Patient noted to have presented to the hospital with episodes of nausea, decreased appetite, symptoms concerning for uremia which did not improve with conservative measures. -Patient followed by nephrology, seen by vascular surgery and TDC placed with creation of fistula and patient subsequently started dialysis with first session on 01/11/2024, second session 01/12/2024 and scheduled for third session 01/14/2024. -Patient with clinical improvement after initiation of dialysis. -Per nephrology.  2..  Chronic asthma -Stable. -Continue Singulair.  3.  Gout -Stable. -Allopurinol.  4.  History of atrial flutter with RVR -Rate controlled. -Cardizem currently  on hold and will resume when able to. -Eliquis for anticoagulation.  5.  Hypertension -BP currently controlled with initiation of dialysis. -Monitor on HD.  6.  OSA -CPAP nightly.  7.  CAD -Stable. -Lopressor and Cardizem on hold and resume when able to.  8.  Right shoulder pain -Patient with complaints of right shoulder pain ongoing since admission per patient. -??  Musculoskeletal as reproducible. -Get plain films of the right shoulder. -Pain management, supportive care. -    DVT prophylaxis: Eliquis Code Status: Full Family Communication: Updated patient and mother at bedside. Disposition: Likely home when clinically improved and cleared by nephrology.  Status is: Inpatient Remains inpatient appropriate because: Severity of illness   Consultants:  Nephrology: Dr. Allena Katz 01/09/2024 Vascular surgery: Dr. Karin Lieu 01/09/2024  Procedures:  Chest x-ray 01/11/2024, 01/08/2024 Renal ultrasound 01/09/2024 Vein mapping 01/09/2024 Right internal jugular tunneled dialysis catheter placement-23 cm palindrome left arm radiocephalic fistula creation per vascular surgery: Dr. Karin Lieu 01/11/2024  Antimicrobials:  Anti-infectives (From admission, onward)    Start     Dose/Rate Route Frequency Ordered Stop   01/11/24 0959  ceFAZolin (ANCEF) 3-4 GM/150ML-% IVPB       Note to Pharmacy: [facility] L: cabinet override      01/11/24 0959 01/11/24 2214         Subjective: Patient complaining of right shoulder pain states he is feeling like crap this morning.  Denies any chest pain or shortness of breath.  States nausea improved after being started on hemodialysis.  Appetite okay.  Mother at bedside.  Objective: Vitals:   01/13/24 0425 01/13/24 0436 01/13/24 0754 01/13/24 0947  BP:  137/64  (!) 125/59   Pulse: 63  64   Resp: 15  16   Temp: 98 F (36.7 C)  97.8 F (36.6 C)   TempSrc: Axillary  Axillary   SpO2: 97%     Weight:  (!) 177.7 kg  (!) 175.3 kg  Height:         Intake/Output Summary (Last 24 hours) at 01/13/2024 1244 Last data filed at 01/13/2024 0900 Gross per 24 hour  Intake 300 ml  Output 2000 ml  Net -1700 ml   Filed Weights   01/12/24 1540 01/13/24 0436 01/13/24 0947  Weight: (!) 176.1 kg (!) 177.7 kg (!) 175.3 kg    Examination:  General exam: Appears calm and comfortable.  Right shoulder tender to palpation Respiratory system: Clear to auscultation. Respiratory effort normal. Cardiovascular system: S1 & S2 heard, RRR. No JVD, murmurs, rubs, gallops or clicks. No pedal edema. Gastrointestinal system: Abdomen is nondistended, soft and nontender. No organomegaly or masses felt. Normal bowel sounds heard. Central nervous system: Alert and oriented. No focal neurological deficits. Extremities: Symmetric 5 x 5 power. Skin: No rashes, lesions or ulcers Psychiatry: Judgement and insight appear normal. Mood & affect appropriate.     Data Reviewed: I have personally reviewed following labs and imaging studies  CBC: Recent Labs  Lab 01/08/24 1919 01/09/24 0639 01/11/24 0950 01/12/24 1545 01/13/24 0941  WBC 5.7 6.2  --  10.5 8.6  NEUTROABS  --  2.9  --   --   --   HGB 11.2* 10.1* 11.9* 11.0* 11.7*  HCT 34.2* 30.5* 35.0* 33.9* 34.9*  MCV 103.0* 102.3*  --  102.4* 102.3*  PLT 65* 64*  --  73* 70*    Basic Metabolic Panel: Recent Labs  Lab 01/08/24 1919 01/09/24 0639 01/10/24 0317 01/11/24 0950 01/12/24 1545 01/13/24 0941  NA 145 144  --  144 141 141  K 3.5 3.7  --  3.9 3.5 3.5  CL 110 110  --  108 106 98  CO2 25 22  --   --  23 27  GLUCOSE 107* 131*  --  124* 111* 126*  BUN 65* 65*  --  59* 64* 49*  CREATININE 7.17* 7.44*  --  8.50* 7.57* 6.11*  CALCIUM 9.3 8.9  --   --  9.0 9.2  MG  --  2.5*  --   --   --   --   PHOS  --  5.5* 4.8*  --  4.6 5.7*    GFR: Estimated Creatinine Clearance: 23.3 mL/min (A) (by C-G formula based on SCr of 6.11 mg/dL (H)).  Liver Function Tests: Recent Labs  Lab 01/08/24 1919  01/09/24 0639 01/12/24 1545 01/13/24 0941  AST 19 21  --   --   ALT 19 21  --   --   ALKPHOS 130* 115  --   --   BILITOT 0.6 0.9  --   --   PROT 6.0* 5.6*  --   --   ALBUMIN 2.8* 2.2* 2.5* 2.7*    CBG: No results for input(s): "GLUCAP" in the last 168 hours.   Recent Results (from the past 240 hours)  Resp panel by RT-PCR (RSV, Flu A&B, Covid) Anterior Nasal Swab     Status: None   Collection Time: 01/05/24 12:30 PM   Specimen: Anterior Nasal Swab  Result Value Ref Range Status   SARS Coronavirus 2 by RT PCR NEGATIVE NEGATIVE Final   Influenza A by PCR NEGATIVE NEGATIVE  Final   Influenza B by PCR NEGATIVE NEGATIVE Final    Comment: (NOTE) The Xpert Xpress SARS-CoV-2/FLU/RSV plus assay is intended as an aid in the diagnosis of influenza from Nasopharyngeal swab specimens and should not be used as a sole basis for treatment. Nasal washings and aspirates are unacceptable for Xpert Xpress SARS-CoV-2/FLU/RSV testing.  Fact Sheet for Patients: BloggerCourse.com  Fact Sheet for Healthcare Providers: SeriousBroker.it  This test is not yet approved or cleared by the [place] FDA and has been authorized for detection and/or diagnosis of SARS-CoV-2 by FDA under an Emergency Use Authorization (EUA). This EUA will remain in effect (meaning this test can be used) for the duration of the COVID-19 declaration under Section 564(b)(1) of the Act, 21 U.S.C. section 360bbb-3(b)(1), unless the authorization is terminated or revoked.     Resp Syncytial Virus by PCR NEGATIVE NEGATIVE Final    Comment: (NOTE) Fact Sheet for Patients: BloggerCourse.com  Fact Sheet for Healthcare Providers: SeriousBroker.it  This test is not yet approved or cleared by the [place] FDA and has been authorized for detection and/or diagnosis of SARS-CoV-2 by FDA under an Emergency Use Authorization  (EUA). This EUA will remain in effect (meaning this test can be used) for the duration of the COVID-19 declaration under Section 564(b)(1) of the Act, 21 U.S.C. section 360bbb-3(b)(1), unless the authorization is terminated or revoked.  Performed at [facility] Lab, [address] [place], [place] [address]   Surgical pcr screen     Status: None   Collection Time: 01/11/24  4:51 AM   Specimen: Nasal Mucosa; Nasal Swab  Result Value Ref Range Status   MRSA, PCR NEGATIVE NEGATIVE Final   Staphylococcus aureus NEGATIVE NEGATIVE Final    Comment: (NOTE) The Xpert SA Assay (FDA approved for NASAL specimens in patients 59 years of age and older), is one component of a comprehensive surveillance program. It is not intended to diagnose infection nor to guide or monitor treatment. Performed at [facility], [address] [place], [place] [address]          Radiology Studies: DG CHEST PORT 1 VIEW Result Date: 01/11/2024 CLINICAL DATA:  Vascular dialysis catheter placement. EXAM: PORTABLE CHEST 1 VIEW COMPARISON:  01/08/2024 FINDINGS: Catheter placed from a right internal jugular approach. Tip is either at the SVC RA junction or at the proximal right atrium. No pneumothorax. IMPRESSION: Catheter placed from a right internal jugular approach. Tip is either at the SVC RA junction or at the proximal right atrium. No pneumothorax. Electronically Signed   By: Paulina Fusi M.D.   On: 01/11/2024 17:33        Scheduled Meds:  acetaminophen  650 mg Oral Once   allopurinol  100 mg Oral Daily   amitriptyline  25 mg Oral QHS   apixaban  5 mg Oral BID   calcium acetate  667 mg Oral TID WC   empagliflozin  10 mg Oral Daily   famotidine  20 mg Oral Daily   metoCLOPramide (REGLAN) injection  10 mg Intravenous Once   montelukast  10 mg Oral Daily   pantoprazole  40 mg Oral BID   Continuous Infusions:  promethazine (PHENERGAN) injection (IM or IVPB) Stopped (01/09/24 2303)      LOS: 4 days    Time spent: 35 minutes    Ramiro Harvest, MD [facility]   To contact the attending provider between 7A-7P or the covering provider during after hours 7P-7A, please log into the  web site [email or web address] and access using universal  password for that web site. If you do not have the password, please call the hospital operator.  01/13/2024, 12:44 PM

## 2024-01-13 NOTE — Progress Notes (Signed)
Contacted [facility] to confirm pt's out-pt HD schedule. Navigator advised that clinic is awaiting MD review and acceptance. Will await determination from provider.   Olivia Canter Renal Navigator [phone]

## 2024-01-13 NOTE — Progress Notes (Signed)
Mobility Specialist Progress Note:    01/13/24 0941  Mobility  Activity Ambulated with assistance in hallway  Level of Assistance Standby assist, set-up cues, supervision of patient - no hands on  Assistive Device None  Distance Ambulated (ft) 500 ft  Activity Response Tolerated well  Mobility Referral Yes  Mobility visit 1 Mobility  Mobility Specialist Start Time (ACUTE ONLY) 0930  Mobility Specialist Stop Time (ACUTE ONLY) 0941  Mobility Specialist Time Calculation (min) (ACUTE ONLY) 11 min   Pt received in bed, mother at bedside. Agreeable to mobility session with slight encouragement. Ambulated in hallway with SBA, no AD required. Tolerated well, asx throughout. Took one standing rest break d/t fatigue. Returned pt to room, linen changed. Left with all needs met.   Feliciana Rossetti Mobility Specialist Please contact via Special educational needs teacher or  Rehab office at [phone]

## 2024-01-14 DIAGNOSIS — I2510 Atherosclerotic heart disease of native coronary artery without angina pectoris: Secondary | ICD-10-CM | POA: Diagnosis not present

## 2024-01-14 DIAGNOSIS — N179 Acute kidney failure, unspecified: Secondary | ICD-10-CM | POA: Diagnosis not present

## 2024-01-14 DIAGNOSIS — I471 Supraventricular tachycardia, unspecified: Secondary | ICD-10-CM | POA: Diagnosis not present

## 2024-01-14 DIAGNOSIS — J4520 Mild intermittent asthma, uncomplicated: Secondary | ICD-10-CM | POA: Diagnosis not present

## 2024-01-14 DIAGNOSIS — R000 Tachycardia, unspecified: Secondary | ICD-10-CM

## 2024-01-14 DIAGNOSIS — I10 Essential (primary) hypertension: Secondary | ICD-10-CM | POA: Diagnosis not present

## 2024-01-14 DIAGNOSIS — N186 End stage renal disease: Secondary | ICD-10-CM | POA: Diagnosis not present

## 2024-01-14 DIAGNOSIS — I4892 Unspecified atrial flutter: Secondary | ICD-10-CM | POA: Diagnosis not present

## 2024-01-14 LAB — RENAL FUNCTION PANEL
Albumin: 2.4 g/dL — ABNORMAL LOW (ref 3.5–5.0)
Anion gap: 14 (ref 5–15)
BUN: 55 mg/dL — ABNORMAL HIGH (ref 6–20)
CO2: 26 mmol/L (ref 22–32)
Calcium: 9 mg/dL (ref 8.9–10.3)
Chloride: 101 mmol/L (ref 98–111)
Creatinine, Ser: 6.57 mg/dL — ABNORMAL HIGH (ref 0.61–1.24)
GFR, Estimated: 10 mL/min — ABNORMAL LOW (ref >60)
Glucose, Bld: 140 mg/dL — ABNORMAL HIGH (ref 70–99)
Phosphorus: 6.7 mg/dL — ABNORMAL HIGH (ref 2.5–4.6)
Potassium: 3.4 mmol/L — ABNORMAL LOW (ref 3.5–5.1)
Sodium: 141 mmol/L (ref 135–145)

## 2024-01-14 LAB — CBC
HCT: 33 % — ABNORMAL LOW (ref 39.0–52.0)
Hemoglobin: 10.9 g/dL — ABNORMAL LOW (ref 13.0–17.0)
MCH: 34 pg (ref 26.0–34.0)
MCHC: 33 g/dL (ref 30.0–36.0)
MCV: 102.8 fL — ABNORMAL HIGH (ref 80.0–100.0)
Platelets: 66 10*3/uL — ABNORMAL LOW (ref 150–400)
RBC: 3.21 MIL/uL — ABNORMAL LOW (ref 4.22–5.81)
RDW: 14.3 % (ref 11.5–15.5)
WBC: 7.5 10*3/uL (ref 4.0–10.5)
nRBC: 0 % (ref 0.0–0.2)

## 2024-01-14 LAB — TROPONIN I (HIGH SENSITIVITY)
Troponin I (High Sensitivity): 4 ng/L (ref <18)
Troponin I (High Sensitivity): 6 ng/L (ref <18)

## 2024-01-14 MED ORDER — ADENOSINE 6 MG/2ML IV SOLN
6.0000 mg | Freq: Once | INTRAVENOUS | Status: DC
  Filled 2024-01-14: qty 2

## 2024-01-14 MED ORDER — HEPARIN SODIUM (PORCINE) 1000 UNIT/ML DIALYSIS
1000.0000 [IU] | INTRAMUSCULAR | Status: DC | PRN
  Filled 2024-01-14: qty 1

## 2024-01-14 MED ORDER — DICLOFENAC SODIUM 1 % EX GEL
2.0000 g | Freq: Four times a day (QID) | CUTANEOUS | Status: DC
  Administered 2024-01-14 – 2024-01-16 (×8): 2 g via TOPICAL
  Filled 2024-01-14 (×3): qty 100

## 2024-01-14 MED ORDER — DOCUSATE SODIUM 100 MG PO CAPS
100.0000 mg | ORAL_CAPSULE | Freq: Every day | ORAL | Status: DC
  Administered 2024-01-14: 100 mg via ORAL
  Filled 2024-01-14: qty 1

## 2024-01-14 MED ORDER — ALTEPLASE 2 MG IJ SOLR: 2.0000 mg | Freq: Once | INTRAMUSCULAR | Status: DC | PRN

## 2024-01-14 MED ORDER — METOPROLOL TARTRATE 5 MG/5ML IV SOLN
5.0000 mg | Freq: Once | INTRAVENOUS | Status: AC
Start: 2024-01-14 — End: 2024-01-14
  Administered 2024-01-14: 5 mg via INTRAVENOUS
  Filled 2024-01-14: qty 5

## 2024-01-14 MED ORDER — METOPROLOL TARTRATE 25 MG PO TABS
25.0000 mg | ORAL_TABLET | Freq: Two times a day (BID) | ORAL | Status: DC
  Administered 2024-01-14 – 2024-01-15 (×3): 25 mg via ORAL
  Filled 2024-01-14 (×4): qty 1

## 2024-01-14 MED ORDER — ADENOSINE 6 MG/2ML IV SOLN
12.0000 mg | INTRAVENOUS | Status: DC
  Filled 2024-01-14: qty 4

## 2024-01-14 MED ORDER — HEPARIN SODIUM (PORCINE) 1000 UNIT/ML DIALYSIS: 40.0000 [IU]/kg | INTRAMUSCULAR | Status: DC | PRN

## 2024-01-14 MED ORDER — ANTICOAGULANT SODIUM CITRATE 4% (200MG/5ML) IV SOLN: 5.0000 mL | Status: DC | PRN

## 2024-01-14 MED ORDER — ADENOSINE 6 MG/2ML IV SOLN
6.0000 mg | INTRAVENOUS | Status: DC
  Filled 2024-01-14: qty 2

## 2024-01-14 MED ORDER — METOPROLOL TARTRATE 5 MG/5ML IV SOLN
5.0000 mg | INTRAVENOUS | Status: DC | PRN
  Administered 2024-01-14: 5 mg via INTRAVENOUS
  Filled 2024-01-14: qty 5

## 2024-01-14 NOTE — Progress Notes (Signed)
Acute SVT noticed even with Metoprolol 25 mg and 5 mg iv given. EKG by patient's nurse. Renal on call notify and made aware.

## 2024-01-14 NOTE — Progress Notes (Incomplete)
Mobility Specialist Progress Note:   01/14/24 1045  Mobility  Activity Ambulated independently in hallway  Level of Assistance Standby assist, set-up cues, supervision of patient - no hands on  Assistive Device None  Activity Response Tolerated well  Mobility Referral Yes  Mobility visit 1 Mobility  Mobility Specialist Start Time (ACUTE ONLY) 1045   Pt received in chair, agreeable to mobility session, mother in room. Pt reports having issues passing BM. RN encouraged ambulatonin hallway   Pre Mobility: 94 bpm During Mobility: Post Mobility:  Sean [name] Mobility Specialist Please contact via SecureChat or  Rehab office at [phone]

## 2024-01-14 NOTE — Progress Notes (Addendum)
Pt has been accepted at [facility] by Dr Allena Katz. Awaiting a return call from clinic with pt's schedule. Will provide update to team as info is received from clinic and discussed with pt,.   Olivia Canter Renal Navigator [phone]  Addendum at 4:04 pm: Pt has been approved for [facility] on TTS 10:25 am chair time. Pt can start on Tuesday, Feb 18 and will need to arrive at 9:30 am to complete paperwork prior treatment. Clinic is unable to start pt on Saturday. Met with pt and pt's mother at bedside. Discussed arrangements. Pt prefers a MWF and inquired if [place] or [facility] has a MWF 2nd shift available. Advised pt would contact [facility] admissions with this inquiry. Contacted [facility] admissions to inquire if [place] or [facility] has a MWF 2nd shift available. Will await a response.

## 2024-01-14 NOTE — Progress Notes (Signed)
Patient ID: Sean [name], male   DOB: 05/02/1971, 53 y.o.   MRN: [id number] [facility] Progress Note   Assessment/ Plan:   1.  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  He is willing to start dialysis and was seen earlier by vascular surgery and is on the schedule for [facility] with likely left BCF creation today.  He will have his first dialysis treatment done today and a second tomorrow.  We will begin the process for outpatient dialysis unit placement. - HD #1 01/11/24 - HD #2 01/12/24 - HD #3 for 01/14/24 - CLIP complete- can go when schedule confirmed 2.  Hypertension: Blood pressures currently elevated, monitor with dialysis. 3.  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  Monitor for bleeding diathesis. 4.  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  Phosphorus level at goal yesterday.  Subjective:    For HD #3 today.  Requesting stool softener.     Objective:   BP (!) 143/84 (BP Location: Right Arm)   Pulse 82   Temp 97.6 F (36.4 C) (Oral)   Resp 17   Ht 5\' 11"  (1.803 m)   Wt (!) 176 kg   SpO2 93%   BMI 54.13 kg/m   Intake/Output Summary (Last 24 hours) at 01/14/2024 1047 Last data filed at 01/14/2024 0600 Gross per 24 hour  Intake 540 ml  Output --  Net 540 ml   Weight change: -0.83 kg  Physical Exam: Gen: Obese man, comfortably sitting on the edge of his bed CVS: Regular rhythm, normal rate S1 and S2  Resp: Diminished breath sounds over bases, no rales/rhonchi Abd: Soft, obese, nontender, bowel sounds normal Ext: 2+ bilateral pitting edema  Imaging: DG Shoulder Right Result Date: 01/14/2024 CLINICAL DATA:  Shoulder pain  EXAM: RIGHT SHOULDER - 2+ VIEW COMPARISON:  None Available. FINDINGS: There is no evidence of fracture or dislocation. There is no evidence of arthropathy or other focal bone abnormality. Soft tissues are unremarkable. IMPRESSION: Negative. Electronically Signed   By: Jasmine Pang M.D.   On: 01/14/2024 00:10    Labs: BMET Recent Labs  Lab 01/08/24 1919 01/09/24 7628 01/10/24 0317 01/11/24 0950 01/12/24 1545 01/13/24 0941 01/14/24 0336  NA 145 144  --  144 141 141 141  K 3.5 3.7  --  3.9 3.5 3.5 3.4*  CL 110 110  --  108 106 98 101  CO2 25 22  --   --  23 27 26   GLUCOSE 107* 131*  --  124* 111* 126* 140*  BUN 65* 65*  --  59* 64* 49* 55*  CREATININE 7.17* 7.44*  --  8.50* 7.57* 6.11* 6.57*  CALCIUM 9.3 8.9  --   --  9.0 9.2 9.0  PHOS  --  5.5* 4.8*  --  4.6 5.7* 6.7*   CBC Recent Labs  Lab 01/09/24 0639 01/11/24 0950 01/12/24 1545 01/13/24 0941 01/14/24 0336  WBC 6.2  --  10.5 8.6 7.5  NEUTROABS 2.9  --   --   --   --   HGB 10.1* 11.9* 11.0* 11.7* 10.9*  HCT 30.5* 35.0* 33.9* 34.9* 33.0*  MCV 102.3*  --  102.4* 102.3* 102.8*  PLT 64*  --  73* 70* 66*    Medications:     (feeding supplement) PROSource  Plus  30 mL Oral BID BM   allopurinol  100 mg Oral Daily   amitriptyline  25 mg Oral QHS   apixaban  5 mg Oral BID   calcium acetate  667 mg Oral TID WC   diclofenac Sodium  2 g Topical QID   empagliflozin  10 mg Oral Daily   famotidine  20 mg Oral Daily   metoCLOPramide (REGLAN) injection  10 mg Intravenous Once   metoprolol tartrate  25 mg Oral BID   montelukast  10 mg Oral Daily   multivitamin  1 tablet Oral QHS   pantoprazole  40 mg Oral BID    Bufford Buttner, MD 01/14/2024, 10:47 AM

## 2024-01-14 NOTE — Progress Notes (Signed)
PROGRESS NOTE    Sean [name]  UUV:[id number] DOB: [birth year] DOA: 01/08/2024 PCP: Lula Olszewski, MD    Chief Complaint  Patient presents with   Chronic Kidney Disease    Brief Narrative:  Sean [name] is a 53 y.o. male with medical history significant of chronic kidney disease.  Patient has been medically managed with diuretics as well as Jardiance as well as losartan in the past.  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  Patient states that for approximately 1 or 2 months he has had intermittent episodes of nausea, poor appetite.  Patient is s/p evaluation by nephrology service Dr. Allena Katz as well as vascular surgical team. Overall plan/impression is uremia with need to start inpatient dialysis after placement of fistula in the left upper extremity as well as tunneled hemodialysis catheter-- planned for 2/10.     Assessment & Plan:   Principal Problem:   Acute renal failure superimposed on stage 5 chronic kidney disease, not on chronic dialysis [facility]) Active Problems:   Hypertension   CAD (coronary artery disease)   Atrial flutter with rapid ventricular response (HCC)   Gout   Nausea and vomiting   Asthma, chronic   ESRD on dialysis (HCC)  #1 new end-stage renal disease with progressive chronic kidney disease stage V -Patient noted to have presented to the hospital with episodes of nausea, decreased appetite, symptoms concerning for uremia which did not improve with conservative measures. -Patient followed by nephrology, seen by vascular surgery and TDC placed with creation of fistula and patient subsequently started dialysis with first session on 01/11/2024, second session 01/12/2024 and scheduled for third session today 01/14/2024. -Patient with clinical improvement after initiation of dialysis. -Per nephrology.  2..  Chronic asthma -Stable. -Continue Singulair.  3.  Gout -Stable. -Allopurinol.  4.  History of atrial flutter with RVR/sinus  tachycardia -Heart rate noted to be elevated sustained in 140s to 150s however likely has sinus tachycardia.  -EKG obtained with a sinus tachycardia, some ST depressions in leads V2 and V3.  -Patient with some complaints of shortness of breath.  -Lopressor resumed this morning.  -Will give Lopressor 5 mg IV x 1.  -Continue Eliquis for anticoagulation.  -Cycle cardiac enzymes, check a 2D echo. -Will consult with cardiology for further evaluation and management.   5.  Hypertension -BP currently controlled with initiation of dialysis. -Resume Lopressor and monitor blood pressure with HD.  6.  OSA -CPAP nightly.  7.  CAD -Stable. -Lopressor and Cardizem initially held during the hospitalization.  -Resume Lopressor.   8.  Right shoulder pain -Patient with complaints of right shoulder pain ongoing since admission per patient. -??  Musculoskeletal as reproducible. -Plain films of the right shoulder unremarkable.   -Place on Voltaren gel.  -Pain management, supportive care. -    DVT prophylaxis: Eliquis Code Status: Full Family Communication: Updated patient and mother at bedside. Disposition: Likely home when clinically improved and cleared by nephrology.  Status is: Inpatient Remains inpatient appropriate because: Severity of illness   Consultants:  Nephrology: Dr. Allena Katz 01/09/2024 Vascular surgery: Dr. Karin Lieu 01/09/2024  Procedures:  Chest x-ray 01/11/2024, 01/08/2024 Renal ultrasound 01/09/2024 Vein mapping 01/09/2024 Right internal jugular tunneled dialysis catheter placement-23 cm palindrome left arm radiocephalic fistula creation per vascular surgery: Dr. Karin Lieu 01/11/2024  Antimicrobials:  Anti-infectives (From admission, onward)    Start     Dose/Rate Route Frequency Ordered Stop   01/11/24 0959  ceFAZolin (ANCEF) 3-4 GM/150ML-% IVPB  Note to Pharmacy: Pasty Spillers: cabinet override      01/11/24 1610 01/11/24 2214         Subjective: Patient sleeping with  CPAP on.  Mother at bedside.  Mother stated just finally fell asleep after working with therapy.   Was called by RN this afternoon that patient tachycardic with heart rate sustaining in the 140s, patient had not received metoprolol this morning.  Went by and reassess patient patient with some complaints of shortness of breath as well as right shoulder pain.  Patient denies any crushing chest pain  Objective: Vitals:   01/13/24 2100 01/13/24 2326 01/14/24 0315 01/14/24 0900  BP: 134/66 127/66 (!) 155/80 (!) 143/84  Pulse:      Resp: 19 16 18 17   Temp: 98.3 F (36.8 C)  97.6 F (36.4 C) 97.6 F (36.4 C)  TempSrc: Oral  Oral Oral  SpO2: 93%     Weight:   (!) 176 kg   Height:        Intake/Output Summary (Last 24 hours) at 01/14/2024 1213 Last data filed at 01/14/2024 0600 Gross per 24 hour  Intake 540 ml  Output --  Net 540 ml   Filed Weights   01/13/24 0436 01/13/24 0947 01/14/24 0315  Weight: (!) 177.7 kg (!) 175.3 kg (!) 176 kg    Examination:  General exam: Appears calm and comfortable.  Right shoulder tender to palpation Respiratory system: Some scattered crackles greater in the bases.  No wheezing.  Fair air movement.  Cardiovascular system: Tachycardia.  No JVD.  No murmurs rubs or gallops.  No pitting lower extremity edema. Gastrointestinal system: Abdomen is obese, soft, nontender, nondistended, positive bowel sounds.  No rebound.  No guarding. Central nervous system: Alert and oriented. No focal neurological deficits. Extremities: Symmetric 5 x 5 power. Skin: No rashes, lesions or ulcers Psychiatry: Judgement and insight appear normal. Mood & affect appropriate.     Data Reviewed: I have personally reviewed following labs and imaging studies  CBC: Recent Labs  Lab 01/08/24 1919 01/09/24 0639 01/11/24 0950 01/12/24 1545 01/13/24 0941 01/14/24 0336  WBC 5.7 6.2  --  10.5 8.6 7.5  NEUTROABS  --  2.9  --   --   --   --   HGB 11.2* 10.1* 11.9* 11.0* 11.7*  10.9*  HCT 34.2* 30.5* 35.0* 33.9* 34.9* 33.0*  MCV 103.0* 102.3*  --  102.4* 102.3* 102.8*  PLT 65* 64*  --  73* 70* 66*    Basic Metabolic Panel: Recent Labs  Lab 01/08/24 1919 01/09/24 0639 01/10/24 0317 01/11/24 0950 01/12/24 1545 01/13/24 0941 01/14/24 0336  NA 145 144  --  144 141 141 141  K 3.5 3.7  --  3.9 3.5 3.5 3.4*  CL 110 110  --  108 106 98 101  CO2 25 22  --   --  23 27 26   GLUCOSE 107* 131*  --  124* 111* 126* 140*  BUN 65* 65*  --  59* 64* 49* 55*  CREATININE 7.17* 7.44*  --  8.50* 7.57* 6.11* 6.57*  CALCIUM 9.3 8.9  --   --  9.0 9.2 9.0  MG  --  2.5*  --   --   --   --   --   PHOS  --  5.5* 4.8*  --  4.6 5.7* 6.7*    GFR: Estimated Creatinine Clearance: 21.7 mL/min (A) (by C-G formula based on SCr of 6.57 mg/dL (H)).  Liver Function  Tests: Recent Labs  Lab 01/08/24 1919 01/09/24 0639 01/12/24 1545 01/13/24 0941 01/14/24 0336  AST 19 21  --   --   --   ALT 19 21  --   --   --   ALKPHOS 130* 115  --   --   --   BILITOT 0.6 0.9  --   --   --   PROT 6.0* 5.6*  --   --   --   ALBUMIN 2.8* 2.2* 2.5* 2.7* 2.4*    CBG: No results for input(s): "GLUCAP" in the last 168 hours.   Recent Results (from the past 240 hours)  Resp panel by RT-PCR (RSV, Flu A&B, Covid) Anterior Nasal Swab     Status: None   Collection Time: 01/05/24 12:30 PM   Specimen: Anterior Nasal Swab  Result Value Ref Range Status   SARS Coronavirus 2 by RT PCR NEGATIVE NEGATIVE Final   Influenza A by PCR NEGATIVE NEGATIVE Final   Influenza B by PCR NEGATIVE NEGATIVE Final    Comment: (NOTE) The Xpert Xpress SARS-CoV-2/FLU/RSV plus assay is intended as an aid in the diagnosis of influenza from Nasopharyngeal swab specimens and should not be used as a sole basis for treatment. Nasal washings and aspirates are unacceptable for Xpert Xpress SARS-CoV-2/FLU/RSV testing.  Fact Sheet for Patients: BloggerCourse.com  Fact Sheet for Healthcare  Providers: SeriousBroker.it  This test is not yet approved or cleared by the [place] FDA and has been authorized for detection and/or diagnosis of SARS-CoV-2 by FDA under an Emergency Use Authorization (EUA). This EUA will remain in effect (meaning this test can be used) for the duration of the COVID-19 declaration under Section 564(b)(1) of the Act, 21 U.S.C. section 360bbb-3(b)(1), unless the authorization is terminated or revoked.     Resp Syncytial Virus by PCR NEGATIVE NEGATIVE Final    Comment: (NOTE) Fact Sheet for Patients: BloggerCourse.com  Fact Sheet for Healthcare Providers: SeriousBroker.it  This test is not yet approved or cleared by the [place] FDA and has been authorized for detection and/or diagnosis of SARS-CoV-2 by FDA under an Emergency Use Authorization (EUA). This EUA will remain in effect (meaning this test can be used) for the duration of the COVID-19 declaration under Section 564(b)(1) of the Act, 21 U.S.C. section 360bbb-3(b)(1), unless the authorization is terminated or revoked.  Performed at [facility], [address], [place], [place] [address]   Surgical pcr screen     Status: None   Collection Time: 01/11/24  4:51 AM   Specimen: Nasal Mucosa; Nasal Swab  Result Value Ref Range Status   MRSA, PCR NEGATIVE NEGATIVE Final   Staphylococcus aureus NEGATIVE NEGATIVE Final    Comment: (NOTE) The Xpert SA Assay (FDA approved for NASAL specimens in patients 41 years of age and older), is one component of a comprehensive surveillance program. It is not intended to diagnose infection nor to guide or monitor treatment. Performed at [facility], [address], [place], [place] [address]          Radiology Studies: DG Shoulder Right Result Date: 01/14/2024 CLINICAL DATA:  Shoulder pain EXAM: RIGHT SHOULDER - 2+ VIEW COMPARISON:  None Available.  FINDINGS: There is no evidence of fracture or dislocation. There is no evidence of arthropathy or other focal bone abnormality. Soft tissues are unremarkable. IMPRESSION: Negative. Electronically Signed   By: Jasmine Pang M.D.   On: 01/14/2024 00:10        Scheduled Meds:  (  feeding supplement) PROSource Plus  30 mL Oral BID BM   allopurinol  100 mg Oral Daily   amitriptyline  25 mg Oral QHS   apixaban  5 mg Oral BID   calcium acetate  667 mg Oral TID WC   diclofenac Sodium  2 g Topical QID   docusate sodium  100 mg Oral Daily   empagliflozin  10 mg Oral Daily   famotidine  20 mg Oral Daily   metoCLOPramide (REGLAN) injection  10 mg Intravenous Once   metoprolol tartrate  25 mg Oral BID   montelukast  10 mg Oral Daily   multivitamin  1 tablet Oral QHS   pantoprazole  40 mg Oral BID   Continuous Infusions:  anticoagulant sodium citrate     promethazine (PHENERGAN) injection (IM or IVPB) Stopped (01/09/24 2303)     LOS: 5 days    Time spent: 40 minutes    Ramiro Harvest, MD [facility]   To contact the attending provider between 7A-7P or the covering provider during after hours 7P-7A, please log into the web site [email or web address] and access using universal [place] password for that web site. If you do not have the password, please call the hospital operator.  01/14/2024, 12:13 PM

## 2024-01-14 NOTE — Plan of Care (Signed)
?  Problem: Clinical Measurements: ?Goal: Ability to maintain clinical measurements within normal limits will improve ?Outcome: Progressing ?Goal: Will remain free from infection ?Outcome: Progressing ?Goal: Diagnostic test results will improve ?Outcome: Progressing ?  ?

## 2024-01-14 NOTE — Consult Note (Signed)
Cardiology Consultation   Patient ID: Sean [name] MRN: [id number]; DOB: 20-Jun-1971  Admit date: 01/08/2024 Date of Consult: 01/14/2024  PCP:  Sean Olszewski, MD   [facility] Providers Cardiologist:  Sean Constant, MD        Patient Profile:   Sean [name] is a 53 y.o. male with a hx of CAD (s/p CABG), ischemic cardiomyopathy, CKD V, idiopathic thrombocytopenia, metabolic dysfunction associated with steatohepatitis who is being seen 01/14/2024 for the evaluation of acute tachycardia at the request of Dr. Janee [name].  History of Present Illness:   Mr. Sean [name] presented to the hospital on 2/4 with symptoms of nausea/vomiting and fluid retention. He appears to have left before formal evaluation (waited 12 hours) but returned again on 2/7 for evaluation of the same symptoms. Patient has had slow rise in creatinine over the last several months, GFR down to ~44mL/min, creatinine 7.17. Given symptoms suggestive of progressive uremia, he was admitted with plans to initiate dialysis. Patient was seen by VS, had TDC placed with fistula. First dialysis session was 01/11/24. Per patient, he is feeling much better since starting dialysis.   He has a history of coronary artery disease status post coronary artery bypass grafting and ischemic cardiomyopathy with recovered ejection fraction. He also has paroxysmal atrial fibrillation and atrial flutter managed with anticoagulation and Diltiazem/metoprolol. In recent timeframe, patient has apparently not been on metoprolol due to issues with prescription refills after changing pharmacies. Appears that Metoprolol, Diltiazem held on admission due to soft BP. This afternoon, he developed asymptomatic tachycardia, regular rhythm and was given Metoprolol Tartrate 25mg . He also received on dose of Lopressor 5mg  IV. Marginal rate improvement, now 130s.   On exam, patient reports vague awareness of his rapid HR but denies focal symptoms  like chest pain or dyspnea. He reports fatigue/general malaise. He has chronic right shoulder pain, reports that it is currently quite painful. Also reports discomfort secondary to constipation.   Past Medical History:  Diagnosis Date   Abnormal liver enzymes    a. Sees a doctor in [place].   Cardiac cirrhosis    a. possible elevated LFTs/low platelets felt due to cardiac cirrhosis per 2017 admission.   Chronic combined systolic and diastolic CHF (congestive heart failure) (HCC)    CKD (chronic kidney disease) stage 3, GFR 30-59 ml/min (HCC) 01/04/2016   Lab Results  Component  Value  Date/Time     GFR  29.24 (L)  09/30/2022 02:51 PM     GFR  27.27 (L)  09/24/2022 03:40 PM     GFR  24.10 (L)  09/03/2022 03:45 PM     GFR  18.98 (L)  08/25/2022 12:21 PM     GFR  38.98 (L)  04/24/2022 02:39 PM         CKD (chronic kidney disease), stage II    Stage 4   Congenital heart defect    a. rightward rotation of heart, almost dextrocardia   Coronary artery disease    a. s/p CABGx1 in 12/2015.   Gastric polyps 11/25/2022   Gastritis and gastroduodenitis 11/25/2022          Gastroesophageal reflux disease with esophagitis and hemorrhage 04/04/2021   Hematuria    a. Chronic hx of this, no prior etiology determined through workup per patient.   History of umbilical hernia repair 07/10/2011   History of hernia surgery twice prior   Hypercholesteremia    a. Prev taken off statin due to abnormal liver function.  Hypertension    Incisional hernia 07/10/2011   History of hernia surgery twice prior   Morbid obesity (HCC)    Morbid obesity with BMI of 50.0-59.9, adult (HCC) 01/04/2016   After Sean [name] GI doctor is setting him up with a wellness clinic to help with weight losS  He reports not having tried anything for weight loss either diet or or medicine or surgery in the past  We failed to get Sean [name] 10/2022      Wt Readings from Last 10 Encounters:  11/13/22  (!) 384 lb 12.8 oz (174.5 kg)  10/13/22  (!)  385 lb (174.6 kg)  09/30/22  (!) 384 lb (174.2 kg)  09/24/22  (!) 382 lb (1   Nausea and vomiting 11/25/2022   OSA on CPAP    Paroxysmal atrial flutter (HCC) 02/12/2016   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   LIMA to LAD   Sinus bradycardia    Thrombocytopenia (HCC)     Past Surgical History:  Procedure Laterality Date   APPENDECTOMY     AV FISTULA PLACEMENT Left 01/11/2024   Procedure: LEFT RADIOCEPHALIC ARTERIOVENOUS (AV) FISTULA CREATION;  Surgeon: Victorino Sparrow, MD;  Location: [facility] OR;  Service: Vascular;  Laterality: Left;   BIOPSY  02/19/2021   Procedure: BIOPSY;  Surgeon: Tressia Danas, MD;  Location: [facility] ENDOSCOPY;  Service: Gastroenterology;;   BIOPSY  11/25/2022   Procedure: BIOPSY;  Surgeon: Tressia Danas, MD;  Location: [facility];  Service: Gastroenterology;;   CARDIAC CATHETERIZATION N/A 12/24/2015   Procedure: Right/Left Heart Cath and Coronary Angiography;  Surgeon: Dolores Patty, MD;  Location: [facility] INVASIVE CV LAB;  Service: Cardiovascular;  Laterality: N/A;   CARDIOVERSION N/A 02/14/2016   Procedure: CARDIOVERSION;  Surgeon: Chrystie Nose, MD;  Location: [facility] ENDOSCOPY;  Service: Cardiovascular;  Laterality: N/A;   COLONOSCOPY WITH PROPOFOL N/A 02/19/2021   Procedure: COLONOSCOPY WITH PROPOFOL;  Surgeon: Tressia Danas, MD;  Location: [facility] ENDOSCOPY;  Service: Gastroenterology;  Laterality: N/A;   CORONARY ARTERY BYPASS GRAFT N/A 12/26/2015   Procedure: Off Pump Coronary Artery Bypass Grafting times one using left internal mammary artery;  Surgeon: Purcell Nails, MD;  Location: [facility] OR;  Service: Open Heart Surgery;  Laterality: N/A;   ESOPHAGOGASTRODUODENOSCOPY (EGD) WITH PROPOFOL N/A 02/19/2021   Procedure: ESOPHAGOGASTRODUODENOSCOPY (EGD) WITH PROPOFOL;  Surgeon: Tressia Danas, MD;  Location: [facility] ENDOSCOPY;  Service: Gastroenterology;  Laterality: N/A;   ESOPHAGOGASTRODUODENOSCOPY (EGD) WITH PROPOFOL N/A 11/25/2022   Procedure: ESOPHAGOGASTRODUODENOSCOPY  (EGD) WITH PROPOFOL;  Surgeon: Tressia Danas, MD;  Location: [facility] ENDOSCOPY;  Service: Gastroenterology;  Laterality: N/A;   GALLBLADDER SURGERY     HERNIA REPAIR     INSERTION OF DIALYSIS CATHETER Right 01/11/2024   Procedure: INSERTION OF TUNNELED  DIALYSIS CATHETER RIGHT INTERNAL JUGULAR;  Surgeon: Victorino Sparrow, MD;  Location: [facility] OR;  Service: Vascular;  Laterality: Right;   IR TRANSCATHETER BX  06/26/2023   IR US GUIDE VASC ACCESS RIGHT  06/26/2023   IR VENOGRAM HEPATIC W HEMODYNAMIC EVALUATION  06/26/2023   LEFT HEART CATH AND CORS/GRAFTS ANGIOGRAPHY N/A 04/15/2017   Procedure: Left Heart Cath and Cors/Grafts Angiography;  Surgeon: Lennette Bihari, MD;  Location: [facility] INVASIVE CV LAB;  Service: Cardiovascular;  Laterality: N/A;   LEFT HEART CATH AND CORS/GRAFTS ANGIOGRAPHY N/A 11/02/2020   Procedure: LEFT HEART CATH AND CORS/GRAFTS ANGIOGRAPHY;  Surgeon: Tonny Bollman, MD;  Location: [facility] INVASIVE CV LAB;  Service: Cardiovascular;  Laterality: N/A;   POLYPECTOMY  02/19/2021   Procedure: POLYPECTOMY;  Surgeon: Tressia Danas, MD;  Location: [facility] ENDOSCOPY;  Service: Gastroenterology;;   TEE WITHOUT CARDIOVERSION N/A 12/26/2015   Procedure: TRANSESOPHAGEAL ECHOCARDIOGRAM (TEE);  Surgeon: Purcell Nails, MD;  Location: [facility] OR;  Service: Open Heart Surgery;  Laterality: N/A;   TEE WITHOUT CARDIOVERSION N/A 02/14/2016   Procedure: TRANSESOPHAGEAL ECHOCARDIOGRAM (TEE);  Surgeon: Chrystie Nose, MD;  Location: [facility] ENDOSCOPY;  Service: Cardiovascular;  Laterality: N/A;     Home Medications:  Prior to Admission medications   Medication Sig Start Date End Date Taking? Authorizing Provider  acetaminophen (TYLENOL) 500 MG tablet Take 1,500 mg by mouth once as needed for moderate pain (pain score 4-6), fever, headache or mild pain (pain score 1-3).   Yes [provider]  allopurinol (ZYLOPRIM) 100 MG tablet Take 1 tablet (100 mg total) by mouth daily. 11/11/23  Yes Leroy Sea, MD   amitriptyline (ELAVIL) 25 MG tablet Take 1 tablet (25 mg total) by mouth at bedtime. Patient taking differently: Take 25 mg by mouth at bedtime. 09/14/23  Yes Arnaldo Natal, NP  apixaban (ELIQUIS) 5 MG TABS tablet Take 1 tablet (5 mg total) by mouth 2 (two) times daily. 07/28/23  Yes Chandrasekhar, Mahesh A, MD  azelastine (ASTELIN) 0.1 % nasal spray Place 2 sprays into both nostrils 2 (two) times daily. Use in each nostril as directed Patient taking differently: Place 2 sprays into both nostrils as needed for allergies. Use in each nostril as directed 12/01/23  Yes Read Drivers, MD  Cholecalciferol (VITAMIN D3) 125 MCG (5000 UT) CAPS Take 1 capsule (5,000 Units total) by mouth daily at 12 noon. 07/05/23  Yes Hudnell, Judeth Cornfield, NP  Cyanocobalamin (VITAMIN B-12 PO) Place 1 tablet under the tongue daily.   Yes [provider]  diltiazem (CARDIZEM SR) 120 MG 12 hr capsule Take 1 capsule (120 mg total) by mouth 2 (two) times daily. 08/04/23  Yes Sean Olszewski, MD  docusate sodium (COLACE) 100 MG capsule Take 2 capsules (200 mg total) by mouth 2 (two) times daily. Patient taking differently: Take 200 mg by mouth as needed for moderate constipation. 11/04/23  Yes Leroy Sea, MD  Evolocumab (REPATHA SURECLICK) 140 MG/ML SOAJ INJECT THE CONTENTS OF 1 SYRINGE UNDER THE SKIN EVERY 14 DAYS 11/30/23  Yes Chandrasekhar, Mahesh A, MD  famotidine (PEPCID) 20 MG tablet Take 1 tablet (20 mg total) by mouth 2 (two) times daily. 10/28/23  Yes Arnaldo Natal, NP  famotidine-calcium carbonate-magnesium hydroxide (PEPCID COMPLETE) 10-800-165 MG chewable tablet Chew 1 tablet by mouth 2 (two) times daily as needed. 11/09/23  Yes Sean Olszewski, MD  fluticasone [facility]) 50 MCG/ACT nasal spray Place 2 sprays into both nostrils daily. 12/01/23  Yes Read Drivers, MD  JARDIANCE 10 MG TABS tablet TAKE 1 TABLET(10 MG) BY MOUTH DAILY BEFORE BREAKFAST Patient taking differently: Take 10 mg  by mouth daily. 10/13/23  Yes Sean Olszewski, MD  ketoconazole (NIZORAL) 2 % cream Apply 1 Application topically 2 (two) times daily. 12/14/23  Yes Sean Olszewski, MD  losartan (COZAAR) 50 MG tablet Take 1 tablet (50 mg total) by mouth daily. 12/03/23  Yes Sean Olszewski, MD  metoprolol tartrate (LOPRESSOR) 25 MG tablet Take 1 tablet (25 mg total) by mouth 2 (two) times daily. 01/01/24  Yes Chandrasekhar, Mahesh A, MD  montelukast (SINGULAIR) 10 MG tablet TAKE 1 TABLET(10 MG) BY MOUTH DAILY Patient taking differently: Take 10 mg by mouth daily. 09/04/23  Yes Sean Olszewski, MD  nitroGLYCERIN (NITROSTAT) 0.4 MG SL tablet Place 1 tablet (0.4 mg total) under the tongue every 5 (five) minutes as needed for chest pain. 02/10/22  Yes Chandrasekhar, Mahesh A, MD  ondansetron (ZOFRAN-ODT) 4 MG disintegrating tablet Take 1 tablet (4 mg total) by mouth every 8 (eight) hours as needed for nausea or vomiting. 12/16/23  Yes Sean Olszewski, MD  pantoprazole (PROTONIX) 40 MG tablet TAKE 1 TABLET(40 MG) BY MOUTH TWICE DAILY 12/23/23  Yes Sean Olszewski, MD  polyethylene glycol (MIRALAX / GLYCOLAX) 17 g packet Mix 17 grams (1 packet) in 8 ounces of fluid and take by mouth daily. Patient taking differently: Take 17 g by mouth daily as needed for moderate constipation. 11/05/23  Yes Leroy Sea, MD  sucralfate (CARAFATE) 1 g tablet Take 1 tablet (1 g total) by mouth at bedtime. 01/01/24  Yes Chandrasekhar, Mahesh A, MD  torsemide (DEMADEX) 10 MG tablet Take 1 tablet (10 mg total) by mouth daily. Patient taking differently: Take 1-2 tablets by mouth daily. Increase to 20mg  daily if swelling worsens 11/05/23  Yes Leroy Sea, MD  triamcinolone (NASACORT) 55 MCG/ACT AERO nasal inhaler Place 1 spray into the nose daily. Start with 1 spray each side twice a day for 3 days, then reduce to daily. 10/08/23  Yes Dulce Sellar, NP    Inpatient Medications: Scheduled Meds:  (feeding supplement) PROSource Plus   30 mL Oral BID BM   allopurinol  100 mg Oral Daily   amitriptyline  25 mg Oral QHS   apixaban  5 mg Oral BID   calcium acetate  667 mg Oral TID WC   diclofenac Sodium  2 g Topical QID   docusate sodium  100 mg Oral Daily   empagliflozin  10 mg Oral Daily   famotidine  20 mg Oral Daily   metoCLOPramide (REGLAN) injection  10 mg Intravenous Once   metoprolol tartrate  5 mg Intravenous Once   metoprolol tartrate  25 mg Oral BID   montelukast  10 mg Oral Daily   multivitamin  1 tablet Oral QHS   pantoprazole  40 mg Oral BID   Continuous Infusions:  anticoagulant sodium citrate     promethazine (PHENERGAN) injection (IM or IVPB) Stopped (01/09/24 2303)   PRN Meds: acetaminophen **OR** acetaminophen, alteplase, alum & mag hydroxide-simeth, anticoagulant sodium citrate, heparin, heparin, hydrALAZINE, labetalol, melatonin, methocarbamol, ondansetron (ZOFRAN) IV, oxyCODONE-acetaminophen, promethazine (PHENERGAN) injection (IM or IVPB), senna  Allergies:    Allergies  Allergen Reactions   Glucophage [Metformin] Other (See Comments)    Pt told by his nephrologist not to take this medication due to his kidney function.   Vibra-Tab [Doxycycline] Shortness Of Breath   Chlorhexidine Gluconate Itching and Other (See Comments)    burning   Augmentin [Amoxicillin-Pot Clavulanate] Itching   Imdur [Isosorbide Dinitrate] Diarrhea   Tape Rash   Valtrex [Valacyclovir] Itching    Social History:   Social History   Socioeconomic History   Marital status: Divorced    Spouse name: Not on file   Number of children: Not on file   Years of education: Not on file   Highest education level: Not on file  Occupational History   Occupation: [employer] doctor  Tobacco Use   Smoking status: Never   Smokeless tobacco: Never  Vaping Use   Vaping status: Never Used  Substance and Sexual Activity   Alcohol use: Yes    Comment: 6 beers per month   Drug use: No   Sexual  activity: Not Currently  Other  Topics Concern   Not on file  Social History Narrative   Truck driver -- regional   Lives with mom and dad   Social Drivers of Health   Financial Resource Strain: Not on file  Food Insecurity: No Food Insecurity (11/05/2023)   Hunger Vital Sign    Worried About Running Out of Food in the Last Year: Never true    Ran Out of Food in the Last Year: Never true  Transportation Needs: No Transportation Needs (11/05/2023)   PRAPARE - Administrator, Civil Service (Medical): No    Lack of Transportation (Non-Medical): No  Physical Activity: Not on file  Stress: Not on file  Social Connections: Unknown (08/26/2022)   Received from [facility]   Social Network    Social Network: Not on file  Intimate Partner Violence: Not At Risk (11/05/2023)   Humiliation, Afraid, Rape, and Kick questionnaire    Fear of Current or Ex-Partner: No    Emotionally Abused: No    Physically Abused: No    Sexually Abused: No    Family History:    Family History  Problem Relation Age of Onset   CAD Father        4 stents ~ 58   Diabetes Father    Diabetes Maternal Grandfather    Diabetes Maternal Uncle    Heart attack Other    Hyperlipidemia Other      ROS:  Please see the history of present illness.   All other ROS reviewed and negative.     Physical Exam/Data:   Vitals:   01/13/24 2326 01/14/24 0315 01/14/24 0900 01/14/24 1416  BP: 127/66 (!) 155/80 (!) 143/84 123/79  Pulse:    (!) 153  Resp: 16 18 17  (!) 25  Temp:  97.6 F (36.4 C) 97.6 F (36.4 C) 98 F (36.7 C)  TempSrc:  Oral Oral Oral  SpO2:    98%  Weight:  (!) 176 kg    Height:        Intake/Output Summary (Last 24 hours) at 01/14/2024 1535 Last data filed at 01/14/2024 0600 Gross per 24 hour  Intake 300 ml  Output --  Net 300 ml      01/14/2024    3:15 AM 01/13/2024    9:47 AM 01/13/2024    4:36 AM  Last 3 Weights  Weight (lbs) 388 lb 1.6 oz 386 lb 6.4 oz 391 lb 12.1 oz  Weight (kg) 176.041 kg  175.27 kg 177.7 kg     Body mass index is 54.13 kg/m.  General: Obese male, Well nourished, well developed, in no acute distress HEENT: normal Neck: difficult to assess with body habitus Vascular: No carotid bruits; Distal pulses 2+ bilaterally Cardiac:  normal S1, S2; rapid and regular rate/rhythm; no murmur  Lungs:  bilateral bases diminished Abd: soft, nontender, no hepatomegaly  Ext: non-pitting edema in bilateral LE. Musculoskeletal:  No deformities, BUE and BLE strength normal and equal Skin: warm and dry  Neuro:  CNs 2-12 intact, no focal abnormalities noted Psych:  Normal affect   EKG:  The EKG was personally reviewed and demonstrates:  SVT with HR 159. No acute ischemic changes Telemetry:  Telemetry was personally reviewed and demonstrates:  SVT with rates up to 160bpm starting around 2:30pm. Marginal improvement in rates after Lopressor. HR 70s, NSR after spontaneous conversion.  Relevant CV Studies:  04/16/23 Stress Test     Findings are consistent with  no ischemia. The study is low risk.   No ST deviation was noted.   LV perfusion is normal.   Left ventricular function is normal. Nuclear stress EF: 57 %. The left ventricular ejection fraction is normal (55-65%). End diastolic cavity size is moderately enlarged. End systolic cavity size is moderately enlarged.   Prior study available for comparison from 10/04/2021. Abnormal perfusion. Apical to mid inferior fixed defect and apical to mid anterior wall defect, both thought to be artifact. LVEF 47%.   Large size, moderate intensity fixed anterior and inferior perfusion defects without reversible ischemia, consistent with artifact. LVEF 57%, moderately dilated LV with normal wall motion. This is a low risk study. Compared to a prior study in 2022, the perfusion defects are unchanged and favored to be artifact. The LVEF has improved.  04/16/23 TTE  IMPRESSIONS     1. Left ventricular ejection fraction, by estimation, is 50  to 55%. The  left ventricle has low normal function. The left ventricle has no regional  wall motion abnormalities. Left ventricular diastolic parameters were  normal.   2. Right ventricular systolic function is normal. The right ventricular  size is mildly enlarged.   3. Left atrial size was mildly dilated.   4. The mitral valve is normal in structure. No evidence of mitral valve  regurgitation. No evidence of mitral stenosis.   5. The aortic valve is tricuspid. Aortic valve regurgitation is not  visualized. No aortic stenosis is present.   6. Aortic dilatation noted. There is mild dilatation of the ascending  aorta, measuring 42 mm.   7. The inferior vena cava is normal in size with greater than 50%  respiratory variability, suggesting right atrial pressure of 3 mmHg.   FINDINGS   Left Ventricle: Left ventricular ejection fraction, by estimation, is 50  to 55%. The left ventricle has low normal function. The left ventricle has  no regional wall motion abnormalities. Definity contrast agent was given  IV to delineate the left  ventricular endocardial borders. The left ventricular internal cavity size  was normal in size. There is no left ventricular hypertrophy. Left  ventricular diastolic parameters were normal.   Right Ventricle: The right ventricular size is mildly enlarged. No  increase in right ventricular wall thickness. Right ventricular systolic  function is normal.   Left Atrium: Left atrial size was mildly dilated.   Right Atrium: Right atrial size was normal in size.   Pericardium: There is no evidence of pericardial effusion.   Mitral Valve: The mitral valve is normal in structure. No evidence of  mitral valve regurgitation. No evidence of mitral valve stenosis.   Tricuspid Valve: The tricuspid valve is normal in structure. Tricuspid  valve regurgitation is not demonstrated. No evidence of tricuspid  stenosis.   Aortic Valve: The aortic valve is tricuspid.  Aortic valve regurgitation is  not visualized. No aortic stenosis is present.   Pulmonic Valve: The pulmonic valve was normal in structure. Pulmonic valve  regurgitation is not visualized. No evidence of pulmonic stenosis.   Aorta: The aortic root is normal in size and structure and aortic  dilatation noted. There is mild dilatation of the ascending aorta,  measuring 42 mm.   Venous: The inferior vena cava is normal in size with greater than 50%  respiratory variability, suggesting right atrial pressure of 3 mmHg.   IAS/Shunts: No atrial level shunt detected by color flow Doppler.   Laboratory Data:  High Sensitivity Troponin:   Recent Labs  Lab  01/09/24 0639  TROPONINIHS 7     Chemistry Recent Labs  Lab 01/09/24 0639 01/11/24 0950 01/12/24 1545 01/13/24 0941 01/14/24 0336  NA 144   < > 141 141 141  K 3.7   < > 3.5 3.5 3.4*  CL 110   < > 106 98 101  CO2 22  --  23 27 26   GLUCOSE 131*   < > 111* 126* 140*  BUN 65*   < > 64* 49* 55*  CREATININE 7.44*   < > 7.57* 6.11* 6.57*  CALCIUM 8.9  --  9.0 9.2 9.0  MG 2.5*  --   --   --   --   GFRNONAA 8*  --  8* 10* 10*  ANIONGAP 12  --  12 16* 14   < > = values in this interval not displayed.    Recent Labs  Lab 01/08/24 1919 01/09/24 0639 01/12/24 1545 01/13/24 0941 01/14/24 0336  PROT 6.0* 5.6*  --   --   --   ALBUMIN 2.8* 2.2* 2.5* 2.7* 2.4*  AST 19 21  --   --   --   ALT 19 21  --   --   --   ALKPHOS 130* 115  --   --   --   BILITOT 0.6 0.9  --   --   --    Lipids No results for input(s): "CHOL", "TRIG", "HDL", "LABVLDL", "LDLCALC", "CHOLHDL" in the last 168 hours.  Hematology Recent Labs  Lab 01/12/24 1545 01/13/24 0941 01/14/24 0336  WBC 10.5 8.6 7.5  RBC 3.31* 3.41* 3.21*  HGB 11.0* 11.7* 10.9*  HCT 33.9* 34.9* 33.0*  MCV 102.4* 102.3* 102.8*  MCH 33.2 34.3* 34.0  MCHC 32.4 33.5 33.0  RDW 14.2 14.6 14.3  PLT 73* 70* 66*   Thyroid No results for input(s): "TSH", "FREET4" in the last 168 hours.   BNPNo results for input(s): "BNP", "PROBNP" in the last 168 hours.  DDimer No results for input(s): "DDIMER" in the last 168 hours.   Radiology/Studies:  DG Shoulder Right Result Date: 01/14/2024 CLINICAL DATA:  Shoulder pain EXAM: RIGHT SHOULDER - 2+ VIEW COMPARISON:  None Available. FINDINGS: There is no evidence of fracture or dislocation. There is no evidence of arthropathy or other focal bone abnormality. Soft tissues are unremarkable. IMPRESSION: Negative. Electronically Signed   By: Jasmine Pang M.D.   On: 01/14/2024 00:10   DG CHEST PORT 1 VIEW Result Date: 01/11/2024 CLINICAL DATA:  Vascular dialysis catheter placement. EXAM: PORTABLE CHEST 1 VIEW COMPARISON:  01/08/2024 FINDINGS: Catheter placed from a right internal jugular approach. Tip is either at the SVC RA junction or at the proximal right atrium. No pneumothorax. IMPRESSION: Catheter placed from a right internal jugular approach. Tip is either at the SVC RA junction or at the proximal right atrium. No pneumothorax. Electronically Signed   By: Paulina Fusi M.D.   On: 01/11/2024 17:33   DG C-Arm 1-60 Min Result Date: 01/11/2024 CLINICAL DATA:  Tunneled dialysis catheter insertion EXAM: DG C-ARM 1-60 MIN COMPARISON:  None Available. FINDINGS: 2 fluoroscopic images are obtained during performance of the procedure and are provided for interpretation only. Right-sided dialysis catheter is identified via internal jugular approach, tip overlying atriocaval junction. Please refer to operative report. Fluoroscopy time: 4 minutes 17 seconds, 59.34 mGy IMPRESSION: 1. Intraoperative exam as above. Electronically Signed   By: Sharlet Salina M.D.   On: 01/11/2024 15:31     Assessment and Plan:  Acute supraventricular tachycardia Hx atrial flutter Patient on Diltiazem and Metoprolol PTA had these medications held due to borderline BP earlier this admission. Today he developed asymptomatic tachy-arrhythmia, appears to be SVT by ECG.  Metoprolol Tartrate 25mg  PO given by primary team, no change in rates. Patient given two additional 5mg  Lopressor pushes at 5 min interval. Again marginal rate improvement into upper 120s. Plans made to chemically cardiovert with Adenosine. Prior to placing backup defib pads, patient went to the bathroom, had a bowel movement, and spontaneously converted to NSR, HR 70s. Continue home Metoprolol tartrate 25mg  BID (resumed today). Recommend giving second dose after HD today. Low threshold to increase dose to maintain NSR.  Continue Eliquis 5mg  BID.   Hypertension BP somewhat fluctuant this admission, primarily hypertensive today, lower in the setting of RVR. Continue to follow closely now that metoprolol has been resumed.   CAD Hyperlipidemia S/p CABG in 2017 (LIMA to LAD). No acute chest pain. No ASA with Eliquis Continue BB as above Continue repatha  Ischemic cardiomyopathy Ischemic cardiomyopathy with recovered ejection fraction. Appears generally well compensated.  Volume management per nephrology with diuresis.   ESRD on HD Per nephrology Risk Assessment/Risk Scores:                For questions or updates, please contact [facility] Please consult [email or web address] for contact info under    Signed, Perlie Gold, PA-C  01/14/2024 3:35 PM

## 2024-01-14 NOTE — Progress Notes (Signed)
Pt heart rate elevated to 150-170's. Pt asymptomatic vs 98.0, 123/79, 156,21, 98 2 liters. MD called. No dizziness, not lightheadedness, no chest pain or pressure. Pt c/o of right shoulder pain (not new) gave ordered voltaren. Pt also c/o jaw pain? Asked if this was related to him having cpap face mask on and he is not sure.  Gave PO metoprolol per Dr. Janee Morn and will make HD aware.  EKG done and placed in chart. Asked pt to remain lying and rest till medication started to work. Placed 2 liters of oxygen for elevated heart rate. Pt resting with call bell within reach.  Will continue to monitor.

## 2024-01-14 NOTE — Progress Notes (Signed)
Mobility Specialist Progress Note:   01/14/24 1045  Mobility  Activity Ambulated independently in hallway  Level of Assistance Standby assist, set-up cues, supervision of patient - no hands on  Assistive Device None  Distance Ambulated (ft) 500 ft  Activity Response Tolerated well  Mobility Referral Yes  Mobility visit 1 Mobility  Mobility Specialist Start Time (ACUTE ONLY) 1045  Mobility Specialist Stop Time (ACUTE ONLY) 1100  Mobility Specialist Time Calculation (min) (ACUTE ONLY) 15 min   Pt received in chair, agreeable to mobility session, mother in room. Pt reports having issues passing BM. RN encouraged ambulation hallway. Tolerated well, limited by fatigue. Returned pt to room with all needs met, sitting up in chair.   Pre Mobility: 94 bpm During Mobility: 108bpm   Feliciana Rossetti Mobility Specialist Please contact via SecureChat or  Rehab office at [phone]

## 2024-01-15 ENCOUNTER — Other Ambulatory Visit (HOSPITAL_COMMUNITY): Payer: BC Managed Care – PPO

## 2024-01-15 DIAGNOSIS — I471 Supraventricular tachycardia, unspecified: Secondary | ICD-10-CM | POA: Diagnosis not present

## 2024-01-15 DIAGNOSIS — N186 End stage renal disease: Secondary | ICD-10-CM | POA: Diagnosis not present

## 2024-01-15 DIAGNOSIS — I4892 Unspecified atrial flutter: Secondary | ICD-10-CM | POA: Diagnosis not present

## 2024-01-15 DIAGNOSIS — J4520 Mild intermittent asthma, uncomplicated: Secondary | ICD-10-CM | POA: Diagnosis not present

## 2024-01-15 DIAGNOSIS — I2510 Atherosclerotic heart disease of native coronary artery without angina pectoris: Secondary | ICD-10-CM | POA: Diagnosis not present

## 2024-01-15 DIAGNOSIS — I4891 Unspecified atrial fibrillation: Secondary | ICD-10-CM

## 2024-01-15 DIAGNOSIS — N179 Acute kidney failure, unspecified: Secondary | ICD-10-CM | POA: Diagnosis not present

## 2024-01-15 DIAGNOSIS — I10 Essential (primary) hypertension: Secondary | ICD-10-CM | POA: Diagnosis not present

## 2024-01-15 LAB — RENAL FUNCTION PANEL
Albumin: 2.2 g/dL — ABNORMAL LOW (ref 3.5–5.0)
Anion gap: 11 (ref 5–15)
BUN: 63 mg/dL — ABNORMAL HIGH (ref 6–20)
CO2: 24 mmol/L (ref 22–32)
Calcium: 8.5 mg/dL — ABNORMAL LOW (ref 8.9–10.3)
Chloride: 103 mmol/L (ref 98–111)
Creatinine, Ser: 6.94 mg/dL — ABNORMAL HIGH (ref 0.61–1.24)
GFR, Estimated: 9 mL/min — ABNORMAL LOW (ref >60)
Glucose, Bld: 133 mg/dL — ABNORMAL HIGH (ref 70–99)
Phosphorus: 6 mg/dL — ABNORMAL HIGH (ref 2.5–4.6)
Potassium: 3.5 mmol/L (ref 3.5–5.1)
Sodium: 138 mmol/L (ref 135–145)

## 2024-01-15 LAB — CBC
HCT: 30.7 % — ABNORMAL LOW (ref 39.0–52.0)
Hemoglobin: 10.1 g/dL — ABNORMAL LOW (ref 13.0–17.0)
MCH: 33.6 pg (ref 26.0–34.0)
MCHC: 32.9 g/dL (ref 30.0–36.0)
MCV: 102 fL — ABNORMAL HIGH (ref 80.0–100.0)
Platelets: 63 10*3/uL — ABNORMAL LOW (ref 150–400)
RBC: 3.01 MIL/uL — ABNORMAL LOW (ref 4.22–5.81)
RDW: 14.6 % (ref 11.5–15.5)
WBC: 8.5 10*3/uL (ref 4.0–10.5)
nRBC: 0.4 % — ABNORMAL HIGH (ref 0.0–0.2)

## 2024-01-15 MED ORDER — CAMPHOR-MENTHOL 0.5-0.5 % EX LOTN: 1.0000 | TOPICAL_LOTION | Freq: Three times a day (TID) | CUTANEOUS | Status: DC | PRN

## 2024-01-15 MED ORDER — CALCIUM ACETATE (PHOS BINDER) 667 MG PO CAPS
1334.0000 mg | ORAL_CAPSULE | Freq: Three times a day (TID) | ORAL | Status: DC
  Administered 2024-01-15 – 2024-01-16 (×2): 1334 mg via ORAL
  Filled 2024-01-15 (×2): qty 2

## 2024-01-15 MED ORDER — DOCUSATE SODIUM 283 MG RE ENEM: 1.0000 | ENEMA | RECTAL | Status: DC | PRN

## 2024-01-15 MED ORDER — HYDROXYZINE HCL 25 MG PO TABS: 25.0000 mg | ORAL_TABLET | Freq: Three times a day (TID) | ORAL | Status: DC | PRN

## 2024-01-15 MED ORDER — POLYETHYLENE GLYCOL 3350 17 G PO PACK: 17.0000 g | PACK | Freq: Two times a day (BID) | ORAL | Status: DC

## 2024-01-15 MED ORDER — SENNOSIDES-DOCUSATE SODIUM 8.6-50 MG PO TABS
1.0000 | ORAL_TABLET | Freq: Two times a day (BID) | ORAL | Status: DC
  Administered 2024-01-15 (×2): 1 via ORAL
  Filled 2024-01-15 (×2): qty 1

## 2024-01-15 MED ORDER — NEPRO/CARBSTEADY PO LIQD: 237.0000 mL | Freq: Three times a day (TID) | ORAL | Status: DC | PRN

## 2024-01-15 MED ORDER — SORBITOL 70 % SOLN: 30.0000 mL | Status: DC | PRN

## 2024-01-15 MED ORDER — HEPARIN SODIUM (PORCINE) 1000 UNIT/ML IJ SOLN
3800.0000 [IU] | Freq: Once | INTRAMUSCULAR | Status: AC
  Administered 2024-01-15: 3800 [IU]

## 2024-01-15 NOTE — Progress Notes (Signed)
Patient ID: Sean [name], male   DOB: Jun 08, 1971, 53 y.o.   MRN: [id number] [facility] Progress Note   Assessment/ Plan:   1.  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  He is willing to start dialysis and was seen earlier by vascular surgery and is on the schedule for [facility] with likely left [facility] .  He will have his first dialysis treatment done today and a second tomorrow.  We will begin the process for outpatient dialysis unit placement. - HD #1 01/11/24 - HD #2 01/12/24 - HD #3 now for 01/15/24 - CLIP complete- can start at OP unit on Tuesday -  if he were ready to be discharged from other services I would feel OK with him having HD here today, then wait til Tuesday for next.  However, if he is to stay here overnight for any reason will do 2 hour treatment Sat so gap does not need to be as long  2.  Hypertension: Blood pressures currently elevated, monitor with dialysis. Lopressor only 3.  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  Monitor for bleeding diathesis. Followed as OP by heme 4.  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 On phoslo-  phos is still high-  will inc dose  5. Anemia-  hgb over 10-  no action needed as of yet  Subjective:    HD was not done last night due to his SVT-  planning on running him today - have heard that he now has an OP spot in [place] [facility] can start on Tuesday    Objective:   BP (!) 121/56 (BP Location: Right Arm)   Pulse (!) 53   Temp 97.6 F (36.4 C) (Axillary)   Resp 18   Ht 5\' 11"  (1.803 m)   Wt (!) 176.5 kg   SpO2 98%   BMI 54.27 kg/m  No intake or output data in the 24 hours ending 01/15/24  0947  Weight change: 1.23 kg  Physical Exam: Gen: Obese man, comfortably sitting on the edge of his bed  TDC in place CVS: Regular rhythm, normal rate S1 and S2  Resp: Diminished breath sounds over bases, no rales/rhonchi Abd: Soft, obese, nontender, bowel sounds normal Ext: 2+ bilateral pitting edema  left AVF-  can appreciate bruit with steth  Imaging: DG Shoulder Right Result Date: 01/14/2024 CLINICAL DATA:  Shoulder pain EXAM: RIGHT SHOULDER - 2+ VIEW COMPARISON:  None Available. FINDINGS: There is no evidence of fracture or dislocation. There is no evidence of arthropathy or other focal bone abnormality. Soft tissues are unremarkable. IMPRESSION: Negative. Electronically Signed   By: Jasmine Pang M.D.   On: 01/14/2024 00:10    Labs: BMET Recent Labs  Lab 01/08/24 1919 01/09/24 4098 01/10/24 0317 01/11/24 0950 01/12/24 1545 01/13/24 0941 01/14/24 0336 01/15/24 0334  NA 145 144  --  144 141 141 141 138  K 3.5 3.7  --  3.9 3.5 3.5 3.4* 3.5  CL 110 110  --  108 106 98 101 103  CO2 25 22  --   --  23 27 26 24   GLUCOSE 107* 131*  --  124* 111* 126* 140* 133*  BUN 65* 65*  --  59* 64* 49* 55* 63*  CREATININE 7.17* 7.44*  --  8.50* 7.57* 6.11*  6.57* 6.94*  CALCIUM 9.3 8.9  --   --  9.0 9.2 9.0 8.5*  PHOS  --  5.5* 4.8*  --  4.6 5.7* 6.7* 6.0*   CBC Recent Labs  Lab 01/09/24 0639 01/11/24 0950 01/12/24 1545 01/13/24 0941 01/14/24 0336 01/15/24 0334  WBC 6.2  --  10.5 8.6 7.5 8.5  NEUTROABS 2.9  --   --   --   --   --   HGB 10.1*   < > 11.0* 11.7* 10.9* 10.1*  HCT 30.5*   < > 33.9* 34.9* 33.0* 30.7*  MCV 102.3*  --  102.4* 102.3* 102.8* 102.0*  PLT 64*  --  73* 70* 66* 63*   < > = values in this interval not displayed.    Medications:     (feeding supplement) PROSource Plus  30 mL Oral BID BM   allopurinol  100 mg Oral Daily   amitriptyline  25 mg Oral QHS   apixaban  5 mg Oral BID   calcium acetate  667 mg Oral TID WC   diclofenac Sodium  2 g Topical QID    empagliflozin  10 mg Oral Daily   famotidine  20 mg Oral Daily   metoCLOPramide (REGLAN) injection  10 mg Intravenous Once   metoprolol tartrate  25 mg Oral BID   montelukast  10 mg Oral Daily   multivitamin  1 tablet Oral QHS   pantoprazole  40 mg Oral BID   polyethylene glycol  17 g Oral BID   senna-docusate  1 tablet Oral BID    Cecille Aver  01/15/2024, 9:47 AM

## 2024-01-15 NOTE — Progress Notes (Signed)
PROGRESS NOTE    Sean [name]  WUJ:[id number] DOB: [birth year] DOA: 01/08/2024 PCP: Lula Olszewski, MD    Chief Complaint  Patient presents with   Chronic Kidney Disease    Brief Narrative:  Sean [name] is a 53 y.o. male with medical history significant of chronic kidney disease.  Patient has been medically managed with diuretics as well as Jardiance as well as losartan in the past.  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  Patient states that for approximately 1 or 2 months he has had intermittent episodes of nausea, poor appetite.  Patient is s/p evaluation by nephrology service Dr. Allena Katz as well as vascular surgical team. Overall plan/impression is uremia with need to start inpatient dialysis after placement of fistula in the left upper extremity as well as tunneled hemodialysis catheter-- planned for 2/10.     Assessment & Plan:   Principal Problem:   ESRD on dialysis [facility]) Active Problems:   Acute renal failure superimposed on stage 5 chronic kidney disease, not on chronic dialysis (HCC)   SVT (supraventricular tachycardia) (HCC)   Hypertension   CAD (coronary artery disease)   Atrial flutter with rapid ventricular response (HCC)   Gout   Nausea and vomiting   Asthma, chronic  #1 new end-stage renal disease with progressive chronic kidney disease stage V -Patient noted to have presented to the hospital with episodes of nausea, decreased appetite, symptoms concerning for uremia which did not improve with conservative measures. -Patient followed by nephrology, seen by vascular surgery and TDC placed with creation of fistula and patient subsequently started dialysis with first session on 01/11/2024, second session 01/12/2024 and scheduled for third session today 01/15/2024. -Patient with clinical improvement after initiation of dialysis. -Per nephrology clip complete and patient can start outpatient dialysis unit on Tuesday. -Patient getting  hemodialysis today and per nephrology we will do a 2-hour treatment tomorrow. -Per nephrology.  2..  Chronic asthma -Stable. -Continue Singulair.  3.  Gout -Stable. -Allopurinol.  4.  History of atrial flutter with RVR/SVT -Heart rate noted to be elevated sustained in 140s to 150s on 01/14/2024. -EKG obtained with a sinus tachycardia, some ST depressions in leads V2 and V3.  -Patient noted to be symptomatic received oral Lopressor as well as IV Lopressor on 01/14/2024 with no significant improvement with SVT. -Cardiac enzymes cycled were negative. -Cardiology consulted who assessed the patient and SVT initially was not broken despite IV metoprolol or vagal maneuvers.  Adenosine was being prepared however patient spontaneously converted while straining for bowel movement. -Patient with no further recurrence of SVT. -Continue Lopressor as recommended per cardiology. -Continue Eliquis for anticoagulation. -Appreciate cardiology input and recommendations.  5.  Hypertension -BP currently controlled with initiation of dialysis. -Lopressor resumed as patient noted to go into SVT on 01/14/2024.  Continue to hold diltiazem due to soft blood pressure.  6.  OSA -CPAP nightly.  7.  CAD -Stable. -Lopressor and Cardizem initially held during the hospitalization.  -Lopressor resumed which we will continue as patient noted to go into SVT on 01/14/2024.   8.  Right shoulder pain -Patient with complaints of right shoulder pain ongoing since admission per patient. -??  Musculoskeletal as reproducible. -Plain films of the right shoulder unremarkable.   -Continue Voltaren gel.  -Pain management, supportive care. -    DVT prophylaxis: Eliquis Code Status: Full Family Communication: Updated patient. Disposition: Likely home when clinically improved and cleared by nephrology with no further bouts of  SVT hopefully in the next 24 hours..  Status is: Inpatient Remains inpatient appropriate because:  Severity of illness   Consultants:  Nephrology: Dr. Allena Katz 01/09/2024 Vascular surgery: Dr. Karin Lieu 01/09/2024 Cardiology: Dr. Cristal Deer 01/14/2024  Procedures:  Chest x-ray 01/11/2024, 01/08/2024 Renal ultrasound 01/09/2024 Vein mapping 01/09/2024 Right internal jugular tunneled dialysis catheter placement-23 cm palindrome left arm radiocephalic fistula creation per vascular surgery: Dr. Karin Lieu 01/11/2024  Antimicrobials:  Anti-infectives (From admission, onward)    Start     Dose/Rate Route Frequency Ordered Stop   01/11/24 0959  ceFAZolin (ANCEF) 3-4 GM/150ML-% IVPB       Note to Pharmacy: Susy [place] L: cabinet override      01/11/24 0959 01/11/24 2214         Subjective: Patient seen in hemodialysis.  States feels much better today than he did late yesterday afternoon when he was in SVT.  Denies any chest pain or shortness of breath.  States Voltaren gel is helping with right shoulder pain when it is placed.  Denies any bleeding.    Objective: Vitals:   01/15/24 1400 01/15/24 1430 01/15/24 1500 01/15/24 1503  BP: (!) 118/56 110/60 102/61   Pulse: (!) 59 (!) 57 60   Resp: (!) 25 16 17    Temp:   98 F (36.7 C)   TempSrc:   Oral   SpO2: 97% 98% 98%   Weight:   (!) 174.8 kg (!) 174.8 kg  Height:        Intake/Output Summary (Last 24 hours) at 01/15/2024 1549 Last data filed at 01/15/2024 1527 Gross per 24 hour  Intake --  Output 4000 ml  Net -4000 ml   Filed Weights   01/15/24 1145 01/15/24 1500 01/15/24 1503  Weight: (!) 174.6 kg (!) 174.8 kg (!) 174.8 kg    Examination:  General exam: Appears calm and comfortable.  Right shoulder less tender to palpation Respiratory system: CTAB anterior lung fields.  No wheezes, no crackles, no rhonchi.  Fair air movement.  Cardiovascular system: RRR no murmurs rubs or gallops.  No JVD.  No pitting lower extremity edema.  Gastrointestinal system: Abdomen is obese, soft, nontender, nondistended, positive bowel sounds.  No rebound.   No guarding. Central nervous system: Alert and oriented. No focal neurological deficits. Extremities: Symmetric 5 x 5 power. Skin: No rashes, lesions or ulcers Psychiatry: Judgement and insight appear normal. Mood & affect appropriate.     Data Reviewed: I have personally reviewed following labs and imaging studies  CBC: Recent Labs  Lab 01/09/24 0639 01/11/24 0950 01/12/24 1545 01/13/24 0941 01/14/24 0336 01/15/24 0334  WBC 6.2  --  10.5 8.6 7.5 8.5  NEUTROABS 2.9  --   --   --   --   --   HGB 10.1* 11.9* 11.0* 11.7* 10.9* 10.1*  HCT 30.5* 35.0* 33.9* 34.9* 33.0* 30.7*  MCV 102.3*  --  102.4* 102.3* 102.8* 102.0*  PLT 64*  --  73* 70* 66* 63*    Basic Metabolic Panel: Recent Labs  Lab 01/09/24 0639 01/10/24 0317 01/11/24 0950 01/12/24 1545 01/13/24 0941 01/14/24 0336 01/15/24 0334  NA 144  --  144 141 141 141 138  K 3.7  --  3.9 3.5 3.5 3.4* 3.5  CL 110  --  108 106 98 101 103  CO2 22  --   --  23 27 26 24   GLUCOSE 131*  --  124* 111* 126* 140* 133*  BUN 65*  --  59* 64* 49* 55*  63*  CREATININE 7.44*  --  8.50* 7.57* 6.11* 6.57* 6.94*  CALCIUM 8.9  --   --  9.0 9.2 9.0 8.5*  MG 2.5*  --   --   --   --   --   --   PHOS 5.5* 4.8*  --  4.6 5.7* 6.7* 6.0*    GFR: Estimated Creatinine Clearance: 20.5 mL/min (A) (by C-G formula based on SCr of 6.94 mg/dL (H)).  Liver Function Tests: Recent Labs  Lab 01/08/24 1919 01/09/24 0639 01/12/24 1545 01/13/24 0941 01/14/24 0336 01/15/24 0334  AST 19 21  --   --   --   --   ALT 19 21  --   --   --   --   ALKPHOS 130* 115  --   --   --   --   BILITOT 0.6 0.9  --   --   --   --   PROT 6.0* 5.6*  --   --   --   --   ALBUMIN 2.8* 2.2* 2.5* 2.7* 2.4* 2.2*    CBG: No results for input(s): "GLUCAP" in the last 168 hours.   Recent Results (from the past 240 hours)  Surgical pcr screen     Status: None   Collection Time: 01/11/24  4:51 AM   Specimen: Nasal Mucosa; Nasal Swab  Result Value Ref Range Status   MRSA,  PCR NEGATIVE NEGATIVE Final   Staphylococcus aureus NEGATIVE NEGATIVE Final    Comment: (NOTE) The Xpert SA Assay (FDA approved for NASAL specimens in patients 86 years of age and older), is one component of a comprehensive surveillance program. It is not intended to diagnose infection nor to guide or monitor treatment. Performed at [facility], [address], [place], [place] [address]          Radiology Studies: DG Shoulder Right Result Date: 01/14/2024 CLINICAL DATA:  Shoulder pain EXAM: RIGHT SHOULDER - 2+ VIEW COMPARISON:  None Available. FINDINGS: There is no evidence of fracture or dislocation. There is no evidence of arthropathy or other focal bone abnormality. Soft tissues are unremarkable. IMPRESSION: Negative. Electronically Signed   By: Jasmine Pang M.D.   On: 01/14/2024 00:10        Scheduled Meds:  (feeding supplement) PROSource Plus  30 mL Oral BID BM   allopurinol  100 mg Oral Daily   amitriptyline  25 mg Oral QHS   apixaban  5 mg Oral BID   calcium acetate  1,334 mg Oral TID WC   diclofenac Sodium  2 g Topical QID   empagliflozin  10 mg Oral Daily   famotidine  20 mg Oral Daily   heparin sodium (porcine)  3,800 Units Intracatheter Once   metoCLOPramide (REGLAN) injection  10 mg Intravenous Once   metoprolol tartrate  25 mg Oral BID   montelukast  10 mg Oral Daily   multivitamin  1 tablet Oral QHS   pantoprazole  40 mg Oral BID   polyethylene glycol  17 g Oral BID   senna-docusate  1 tablet Oral BID   Continuous Infusions:  promethazine (PHENERGAN) injection (IM or IVPB) Stopped (01/09/24 2303)     LOS: 6 days    Time spent: 40 minutes    Ramiro Harvest, MD [facility]   To contact the attending provider between 7A-7P or the covering provider during after hours 7P-7A, please log into the web site [email or web address] and access using universal [place] password for that  web site. If you do not have the password, please call the  hospital operator.  01/15/2024, 3:49 PM

## 2024-01-15 NOTE — Progress Notes (Signed)
Mobility Specialist Progress Note:    01/15/24 1100  Mobility  Activity Ambulated independently in hallway  Level of Assistance Standby assist, set-up cues, supervision of patient - no hands on  Assistive Device None  Distance Ambulated (ft) 500 ft  Activity Response Tolerated well  Mobility Referral Yes  Mobility visit 1 Mobility  Mobility Specialist Start Time (ACUTE ONLY) 1045  Mobility Specialist Stop Time (ACUTE ONLY) 1100  Mobility Specialist Time Calculation (min) (ACUTE ONLY) 15 min   Pt received in bed, agreeable to mobility session. Ambulated in hallway, no AD required. Tolerated well, asx throughout. Returned pt back to room. All needs met, family in room.   Pre Mobility: HR 56 bpm During Mobility: HR 79 bpm Post Mobility: HR 70 bpm  Feliciana Rossetti Mobility Specialist Please contact via Special educational needs teacher or  Rehab office at [phone]

## 2024-01-15 NOTE — Progress Notes (Signed)
   01/15/24 1500  Vitals  Temp 98 F (36.7 C)  Pulse Rate 60  Resp 17  BP 102/61  SpO2 98 %  O2 Device Room Air  Weight (!) 174.8 kg  Type of Weight Post-Dialysis  Post Treatment  Hemodialysis Intake (mL) 0 mL  Liters Processed 54  Fluid Removed (mL) 2000 mL  Tolerated HD Treatment Yes   Received patient in bed to unit.  Alert and oriented.  Informed consent signed and in chart.   TX duration:3hrs  Patient tolerated well.  Transported back to the room  Alert, without acute distress.  Hand-off given to patient's nurse.   Access used: [facility] Access issues: none  Total UF removed: 2L Medication(s) given: pain med  [facility]

## 2024-01-15 NOTE — Progress Notes (Signed)
   Patient Name: Sean [name] Date of Encounter: 01/15/2024 [facility] Cardiologist: Christell Constant, MD   Interval Summary  .    Patient feels well today "except for my shoulder and back."   Vital Signs .    Vitals:   01/15/24 0045 01/15/24 0344 01/15/24 0559 01/15/24 0758  BP: 117/61 (!) 115/58  (!) 121/56  Pulse: (!) 59 (!) 53    Resp: 17 18 (!) 25 18  Temp: 97.6 F (36.4 C) 97.9 F (36.6 C)  97.6 F (36.4 C)  TempSrc: Axillary Axillary  Axillary  SpO2: 97% 98%    Weight:   (!) 176.5 kg   Height:       No intake or output data in the 24 hours ending 01/15/24 0924    01/15/2024    5:59 AM 01/14/2024    3:15 AM 01/13/2024    9:47 AM  Last 3 Weights  Weight (lbs) 389 lb 1.8 oz 388 lb 1.6 oz 386 lb 6.4 oz  Weight (kg) 176.5 kg 176.041 kg 175.27 kg      Telemetry/ECG    Sinus rhythm/sinus bradycardia - Personally Reviewed  Physical Exam .   GEN: No acute distress.  CPAP in place on exam. Neck: No JVD Cardiac: RRR, no murmurs, rubs, or gallops.  Respiratory: Clear to auscultation bilaterally. GI: Soft, nontender, non-distended  MS: No edema  Assessment & Plan .    Sean [name] is a 53 y.o. male with a hx of CAD (s/p CABG), ischemic cardiomyopathy, CKD V, idiopathic thrombocytopenia, metabolic dysfunction associated with steatohepatitis who is being seen for the evaluation of acute tachycardia at the request of Dr. Janee Morn.    Acute SVT Hx atrial flutter Cardiology consulted on 2/13 for management of SVT. Patient on Diltiazem and Metoprolol PTA, had these held in the setting of new HD initiation. Yesterday patient was consented for chemical cardioversion with Adenosine but had spontaneous return to NSR while having a bowel movement. Remains in NSR today without recurrent tachy-arrhythmia. Continue Metoprolol Tartrate 25mg  BID Continue Eliquis 5mg  BID.   Hypertension Patient with labile BP this admission in setting of new start HD.  Low normal today. Continue Metoprolol as above. Given propensity for SVT, needs AV nodal blocking agent.   CAD Hyperlipidemia S/p CABG in 2017 (LIMA to LAD). No acute chest pain. 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 Net negative 3.1L this admission with HD, euvolemic appearing.  Continue HD per nephrology  ESRD, new HD initiation Management per nephrology  Cardiology will sign off. Please re-consult if new cardiac concerns while patient is admitted.   For questions or updates, please contact [facility] Please consult [email or web address] for contact info under        Signed, Perlie Gold, PA-C

## 2024-01-15 NOTE — Progress Notes (Addendum)
Advised by [facility] admissions and clinic that [facility] and [facility] do not have a MWF 2nd shift available. Contacted pt via cell phone this morning to make aware of this info. Pt's mother answered phone and reports pt is asleep. Mother advised that [facility] and [facility] do not have a MWF 2nd shift appt available. Mother reports she will make pt aware of this info when he wakes up. Update provided to attending, nephrologist, pt's RN,and RN CM regarding pt's acceptance at [facility] (TTS 10:25 chair time- see 2/13 note for full details). Will add HD arrangements to pt's AVS and will contact renal NP regarding clinic's need for orders at d/c. Will assist as needed.   Olivia Canter Renal Navigator [phone]  Addendum at 4:12 pm: Contacted [facility] to advise clinic of pt's likely d/c tomorrow and pt will likely start on Tuesday as planned. Renal NP made aware of this info as well.

## 2024-01-16 ENCOUNTER — Other Ambulatory Visit (HOSPITAL_COMMUNITY): Payer: Self-pay

## 2024-01-16 DIAGNOSIS — N179 Acute kidney failure, unspecified: Secondary | ICD-10-CM | POA: Diagnosis not present

## 2024-01-16 DIAGNOSIS — N186 End stage renal disease: Secondary | ICD-10-CM | POA: Diagnosis not present

## 2024-01-16 DIAGNOSIS — I471 Supraventricular tachycardia, unspecified: Secondary | ICD-10-CM | POA: Diagnosis not present

## 2024-01-16 DIAGNOSIS — R112 Nausea with vomiting, unspecified: Secondary | ICD-10-CM

## 2024-01-16 DIAGNOSIS — I10 Essential (primary) hypertension: Secondary | ICD-10-CM | POA: Diagnosis not present

## 2024-01-16 LAB — CBC
HCT: 30.9 % — ABNORMAL LOW (ref 39.0–52.0)
Hemoglobin: 10.2 g/dL — ABNORMAL LOW (ref 13.0–17.0)
MCH: 33.2 pg (ref 26.0–34.0)
MCHC: 33 g/dL (ref 30.0–36.0)
MCV: 100.7 fL — ABNORMAL HIGH (ref 80.0–100.0)
Platelets: 62 10*3/uL — ABNORMAL LOW (ref 150–400)
RBC: 3.07 MIL/uL — ABNORMAL LOW (ref 4.22–5.81)
RDW: 14.6 % (ref 11.5–15.5)
WBC: 7 10*3/uL (ref 4.0–10.5)
nRBC: 0 % (ref 0.0–0.2)

## 2024-01-16 LAB — RENAL FUNCTION PANEL
Albumin: 2.3 g/dL — ABNORMAL LOW (ref 3.5–5.0)
Anion gap: 10 (ref 5–15)
BUN: 50 mg/dL — ABNORMAL HIGH (ref 6–20)
CO2: 25 mmol/L (ref 22–32)
Calcium: 8.6 mg/dL — ABNORMAL LOW (ref 8.9–10.3)
Chloride: 103 mmol/L (ref 98–111)
Creatinine, Ser: 6.82 mg/dL — ABNORMAL HIGH (ref 0.61–1.24)
GFR, Estimated: 9 mL/min — ABNORMAL LOW (ref >60)
Glucose, Bld: 156 mg/dL — ABNORMAL HIGH (ref 70–99)
Phosphorus: 5.2 mg/dL — ABNORMAL HIGH (ref 2.5–4.6)
Potassium: 3.4 mmol/L — ABNORMAL LOW (ref 3.5–5.1)
Sodium: 138 mmol/L (ref 135–145)

## 2024-01-16 MED ORDER — PANTOPRAZOLE SODIUM 40 MG PO TBEC
40.0000 mg | DELAYED_RELEASE_TABLET | Freq: Two times a day (BID) | ORAL | 0 refills | Status: DC
  Filled 2024-01-16: qty 180, 90d supply, fill #0

## 2024-01-16 MED ORDER — DICLOFENAC SODIUM 1 % EX GEL
CUTANEOUS | 0 refills | Status: DC
  Filled 2024-01-16: qty 50, 7d supply, fill #0

## 2024-01-16 MED ORDER — HEPARIN SODIUM (PORCINE) 1000 UNIT/ML IJ SOLN
3800.0000 [IU] | Freq: Once | INTRAMUSCULAR | Status: AC
  Administered 2024-01-16: 3800 [IU]
  Filled 2024-01-16: qty 3.8

## 2024-01-16 MED ORDER — CALCIUM ACETATE (PHOS BINDER) 667 MG PO CAPS
1334.0000 mg | ORAL_CAPSULE | Freq: Three times a day (TID) | ORAL | 0 refills | Status: DC
  Filled 2024-01-16: qty 180, 30d supply, fill #0

## 2024-01-16 MED ORDER — METHOCARBAMOL 500 MG PO TABS
500.0000 mg | ORAL_TABLET | Freq: Three times a day (TID) | ORAL | 0 refills | Status: DC | PRN
  Filled 2024-01-16: qty 30, 10d supply, fill #0

## 2024-01-16 NOTE — Progress Notes (Signed)
 Patient discharged from unit  medications and property returned to designated person. Discharge instructions reviewed and patient questions answered.

## 2024-01-16 NOTE — Progress Notes (Signed)
   01/16/24 1349  Vitals  Temp 97.9 F (36.6 C)  Temp Source Oral  BP 118/77  Pulse Rate 62  ECG Heart Rate 63  Resp 15  Oxygen Therapy  SpO2 97 %  O2 Device Room Air  During Treatment Monitoring  Dialysate Potassium Concentration 3  Dialysate Calcium Concentration 2.5  Duration of HD Treatment -hour(s) 2 hour(s)  Cumulative Fluid Removed (mL) per Treatment  1000.12  HD Safety Checks Performed Yes  Intra-Hemodialysis Comments Tx completed  Dialysis Fluid Bolus Normal Saline  Bolus Amount (mL) 300 mL  Hemodialysis Catheter Right Internal jugular Double lumen Permanent (Tunneled)  Placement Date/Time: 01/11/24 1132   Placed prior to admission: No  Serial / Lot #: [id number]  Expiration Date: 04/30/28  Time Out: Correct patient;Correct site;Correct procedure  Maximum sterile barrier precautions: Sterile gown;Sterile gloves;Mask;C...  Site Condition No complications  Blue Lumen Status Flushed;Heparin locked;Dead end cap in place  Red Lumen Status Heparin locked;Dead end cap in place;Flushed  Purple Lumen Status N/A  Catheter fill solution Heparin 1000 units/ml  Catheter fill volume (Arterial) 1.9 cc  Catheter fill volume (Venous) 1.9  Dressing Type Transparent  Dressing Status Antimicrobial disc/dressing in place;Clean, Dry, Intact  Interventions Other (Comment) (deaccessed)  Drainage Description None  Dressing Change Due 01/22/24  Post treatment catheter status Capped and Clamped

## 2024-01-16 NOTE — Discharge Summary (Signed)
Physician Discharge Summary  Sean [name] BMW:[id number] DOB: 1971-07-07 DOA: 01/08/2024  PCP: Lula Olszewski, MD  Admit date: 01/08/2024 Discharge date: 01/16/2024  Time spent: 60 minutes  Recommendations for Outpatient Follow-up:  Follow-up with Lula Olszewski, MD in 2 weeks.  On follow-up patient's chronic medical issues need to be reassessed as well as his blood pressure. Follow-up with Eligha Bridegroom, cardiology on 02/01/2024 at 2:20 PM.   Follow-up at the [facility] on 01/19/2024. Follow-up with vascular surgery in 6 weeks.   Discharge Diagnoses:  Principal Problem:   ESRD on dialysis Sean [name]) Active Problems:   Acute renal failure superimposed on stage 5 chronic kidney disease, not on chronic dialysis (HCC)   SVT (supraventricular tachycardia) (HCC)   Hypertension   CAD (coronary artery disease)   Atrial flutter with rapid ventricular response (HCC)   Gout   Nausea and vomiting   Asthma, chronic   Discharge Condition: Stable and improved.  Diet recommendation: Heart healthy  Filed Weights   01/16/24 0431 01/16/24 1135 01/16/24 1358  Weight: (!) 176.6 kg (!) 174.3 kg (!) 173.3 kg    History of present illness:  HPI per Dr.Goel Sean [name] is a 53 y.o. male with medical history significant of chronic kidney disease.  Patient has been medically managed with diuretics as well as Jardiance as well as losartan in the past.  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  Patient states that for approximately 1 or 2 months he has had intermittent episodes of nausea, poor appetite.  He denies any chest pain fever diarrhea.  Since the last 6 days or so patient actually started throwing up.  This was very variable.  Sometimes he would throw up 3 times in a day sometimes not.  Patient has been able to intermittently tolerate diet.  Patient is still making urine.  Patient has developed increasing ankle swelling bilaterally.  Again no fever no chest  pain no palpitation no sensation of shortness of breath.   Patient came with above symptoms to our [facility] ER initially about 4 days ago.  But seem to have left without notice at the time.  And then returned again yesterday [facility].  Patient is noted to have BUN of 65 and creatinine of 7.17 which are much worse than his previous baseline.  Patient is transferred to [name] inpatient [facility].  Medical evaluation is sought.   Patient is s/p evaluation by nephrology service Dr. Allena Katz as well as vascular surgical team.  Overall plan/impression is uremia with need to start inpatient dialysis after placement of fistula in the left upper extremity as well as tunneled hemodialysis catheter.  Patient is pretty well-informed.  [name] Course:  #1 new end-stage renal disease with progressive chronic kidney disease stage V -Patient noted to have presented to the [name] with episodes of nausea, decreased appetite, symptoms concerning for uremia which did not improve with conservative measures. -Patient followed by nephrology, seen by vascular surgery and TDC placed with creation of fistula and patient subsequently started dialysis with first session on 01/11/2024, second session 01/12/2024 and scheduled for third session today 01/15/2024. -Patient with clinical improvement after initiation of dialysis. -Per nephrology clip complete and patient can start outpatient dialysis unit on Tuesday, 01/19/2024. -Patient received hemodialysis on 01/15/2024 and did a 2-hour treatment on day of discharge 01/16/2024.   -Outpatient follow-up at dialysis center.    2..  Chronic asthma -Stable. -Patient maintained on Singulair.  3.  Gout -Stable. -Patient maintained on allopurinol.   4.  History of atrial flutter with RVR/SVT -Heart rate noted to be elevated sustained in 140s to 150s on 01/14/2024. -EKG obtained with a sinus tachycardia, some ST depressions in leads V2 and V3.  -Patient noted to be  symptomatic received oral Lopressor as well as IV Lopressor on 01/14/2024 with no significant improvement with SVT. -Cardiac enzymes cycled were negative. -Cardiology consulted who assessed the patient and SVT initially was not broken despite IV metoprolol or vagal maneuvers.  Adenosine was being prepared however patient spontaneously converted while straining for bowel movement. -Patient with no further recurrence of SVT. - Lopressor as recommended per cardiology. -Patient maintained on Eliquis for anticoagulation. -Outpatient follow-up with cardiology.   5.  Hypertension -BP currently controlled with initiation of dialysis. -Lopressor resumed as patient noted to go into SVT on 01/14/2024.  -Diltiazem discontinued due to soft blood pressure.  -Outpatient follow-up.   6.  OSA -CPAP nightly.   7.  CAD -Stable. -Lopressor and Cardizem initially held during the hospitalization.  -Lopressor resumed as patient noted to go into SVT on 01/14/2024.  -Patient with discharge on home regimen of Lopressor.   8.  Right shoulder pain -Patient with complaints of right shoulder pain ongoing since admission per patient. -??  Musculoskeletal as reproducible. -Plain films of the right shoulder unremarkable.   -Patient placed on Robaxin as needed as well as Voltaren gel.  -Improved clinically.  -Outpatient follow-up. -  Procedures: Chest x-ray 01/11/2024, 01/08/2024 Renal ultrasound 01/09/2024 Vein mapping 01/09/2024 Right internal jugular tunneled dialysis catheter placement-23 cm palindrome left arm radiocephalic fistula creation per vascular surgery: Dr. Karin Lieu 01/11/2024  Consultations: Nephrology: Dr. Allena Katz 01/09/2024 Vascular surgery: Dr. Karin Lieu 01/09/2024 Cardiology: Dr. Cristal Deer 01/14/2024  Discharge Exam: Vitals:   01/16/24 1349 01/16/24 1356  BP: 118/77 125/67  Pulse: 62 62  Resp: 15 18  Temp: 97.9 F (36.6 C)   SpO2: 97% 98%    General: NAD Cardiovascular: Regular rate rhythm no  murmurs rubs or gallops.  No JVD.  No lower extremity edema. Respiratory: Lungs clear to auscultation bilaterally anterior lung fields.  No wheezes, no crackles, no rhonchi.  Fair air movement.  Speaking in full sentences.  Discharge Instructions   Discharge Instructions     Diet - low sodium heart healthy   Complete by: As directed    Increase activity slowly   Complete by: As directed       Allergies as of 01/16/2024       Reactions   Glucophage [metformin] Other (See Comments)   Pt told by his nephrologist not to take this medication due to his kidney function.   Vibra-tab [doxycycline] Shortness Of Breath   Chlorhexidine Gluconate Itching, Other (See Comments)   burning   Augmentin [amoxicillin-pot Clavulanate] Itching   Imdur [isosorbide Dinitrate] Diarrhea   Tape Rash   Valtrex [valacyclovir] Itching        Medication List     STOP taking these medications    diltiazem 120 MG 12 hr capsule Commonly known as: CARDIZEM SR   Jardiance 10 MG Tabs tablet Generic drug: empagliflozin   losartan 50 MG tablet Commonly known as: COZAAR   torsemide 10 MG tablet Commonly known as: DEMADEX       TAKE these medications    acetaminophen 500 MG tablet Commonly known as: TYLENOL Take 1,500 mg by mouth once as needed for moderate pain (pain score 4-6), fever, headache or mild pain (pain score  1-3).   allopurinol 100 MG tablet Commonly known as: ZYLOPRIM Take 1 tablet (100 mg total) by mouth daily.   amitriptyline 25 MG tablet Commonly known as: ELAVIL Take 1 tablet (25 mg total) by mouth at bedtime.   apixaban 5 MG Tabs tablet Commonly known as: Eliquis Take 1 tablet (5 mg total) by mouth 2 (two) times daily.   azelastine 0.1 % nasal spray Commonly known as: ASTELIN Place 2 sprays into both nostrils 2 (two) times daily. Use in each nostril as directed What changed:  when to take this reasons to take this   calcium acetate 667 MG capsule Commonly known  as: PHOSLO Take 2 capsules (1,334 mg total) by mouth 3 (three) times daily with meals.   docusate sodium 100 MG capsule Commonly known as: COLACE Take 2 capsules (200 mg total) by mouth 2 (two) times daily. What changed:  when to take this reasons to take this   famotidine 20 MG tablet Commonly known as: PEPCID Take 1 tablet (20 mg total) by mouth 2 (two) times daily.   fluticasone 50 MCG/ACT nasal spray Commonly known as: FLONASE Place 2 sprays into both nostrils daily.   ketoconazole 2 % cream Commonly known as: NIZORAL Apply 1 Application topically 2 (two) times daily.   methocarbamol 500 MG tablet Commonly known as: ROBAXIN Take 1 tablet (500 mg total) by mouth every 8 (eight) hours as needed for muscle spasms (shoulder pain).   metoprolol tartrate 25 MG tablet Commonly known as: LOPRESSOR Take 1 tablet (25 mg total) by mouth 2 (two) times daily.   montelukast 10 MG tablet Commonly known as: SINGULAIR TAKE 1 TABLET(10 MG) BY MOUTH DAILY What changed: See the new instructions.   nitroGLYCERIN 0.4 MG SL tablet Commonly known as: Nitrostat Place 1 tablet (0.4 mg total) under the tongue every 5 (five) minutes as needed for chest pain.   ondansetron 4 MG disintegrating tablet Commonly known as: ZOFRAN-ODT Take 1 tablet (4 mg total) by mouth every 8 (eight) hours as needed for nausea or vomiting.   pantoprazole 40 MG tablet Commonly known as: PROTONIX Take 1 tablet (40 mg total) by mouth 2 (two) times daily before a meal. What changed: See the new instructions.   Pepcid Complete 10-800-165 MG chewable tablet Generic drug: famotidine-calcium carbonate-magnesium hydroxide Chew 1 tablet by mouth 2 (two) times daily as needed.   polyethylene glycol 17 g packet Commonly known as: MIRALAX / GLYCOLAX Mix 17 grams (1 packet) in 8 ounces of fluid and take by mouth daily. What changed:  when to take this reasons to take this   Repatha SureClick 140 MG/ML Soaj Generic  drug: Evolocumab INJECT THE CONTENTS OF 1 SYRINGE UNDER THE SKIN EVERY 14 DAYS   sucralfate 1 g tablet Commonly known as: CARAFATE Take 1 tablet (1 g total) by mouth at bedtime.   triamcinolone 55 MCG/ACT Aero nasal inhaler Commonly known as: NASACORT Place 1 spray into the nose daily. Start with 1 spray each side twice a day for 3 days, then reduce to daily.   VITAMIN B-12 PO Place 1 tablet under the tongue daily.   Vitamin D3 125 MCG (5000 UT) Caps Take 1 capsule (5,000 Units total) by mouth daily at 12 noon.   Voltaren Arthritis Pain 1 % Gel Generic drug: diclofenac Sodium Apply 2 g topically 4 (four) times daily for 7 days, THEN 2 g 4 (four) times daily as needed. Start taking on: January 16, 2024  Allergies  Allergen Reactions   Glucophage [Metformin] Other (See Comments)    Pt told by his nephrologist not to take this medication due to his kidney function.   Vibra-Tab [Doxycycline] Shortness Of Breath   Chlorhexidine Gluconate Itching and Other (See Comments)    burning   Augmentin [Amoxicillin-Pot Clavulanate] Itching   Imdur [Isosorbide Dinitrate] Diarrhea   Tape Rash   Valtrex [Valacyclovir] Itching    Follow-up Information     [facility] at [facility] Follow up in 6 week(s).   Specialty: Vascular Surgery Contact information: [address] [place] [address] [phone]        Center, [facility] Go on 01/19/2024.   Why: Schedule is Tuesday, Thursday, Saturday with 10:25 am chair time.  On Tuesday (2/18), please arrive at 9:30 am for paperwork prior to treatment. Contact information: [address] [place] [address] [phone]         Lula Olszewski, MD. Schedule an appointment as soon as possible for a visit in 2 week(s).   Specialty: Internal Medicine Contact information: [address] [place] [address] [phone]         Levi Aland, NP Follow up on  02/01/2024.   Specialty: Cardiology Why: Follow-up at 2:20 PM Contact information: [address] [place] [address] [phone]                  The results of significant diagnostics from this hospitalization (including imaging, microbiology, ancillary and laboratory) are listed below for reference.    Significant Diagnostic Studies: DG Shoulder Right Result Date: 01/14/2024 CLINICAL DATA:  Shoulder pain EXAM: RIGHT SHOULDER - 2+ VIEW COMPARISON:  None Available. FINDINGS: There is no evidence of fracture or dislocation. There is no evidence of arthropathy or other focal bone abnormality. Soft tissues are unremarkable. IMPRESSION: Negative. Electronically Signed   By: Jasmine Pang M.D.   On: 01/14/2024 00:10   DG CHEST PORT 1 VIEW Result Date: 01/11/2024 CLINICAL DATA:  Vascular dialysis catheter placement. EXAM: PORTABLE CHEST 1 VIEW COMPARISON:  01/08/2024 FINDINGS: Catheter placed from a right internal jugular approach. Tip is either at the SVC RA junction or at the proximal right atrium. No pneumothorax. IMPRESSION: Catheter placed from a right internal jugular approach. Tip is either at the SVC RA junction or at the proximal right atrium. No pneumothorax. Electronically Signed   By: Paulina Fusi M.D.   On: 01/11/2024 17:33   DG C-Arm 1-60 Min Result Date: 01/11/2024 CLINICAL DATA:  Tunneled dialysis catheter insertion EXAM: DG C-ARM 1-60 MIN COMPARISON:  None Available. FINDINGS: 2 fluoroscopic images are obtained during performance of the procedure and are provided for interpretation only. Right-sided dialysis catheter is identified via internal jugular approach, tip overlying atriocaval junction. Please refer to operative report. Fluoroscopy time: 4 minutes 17 seconds, 59.34 mGy IMPRESSION: 1. Intraoperative exam as above. Electronically Signed   By: Sharlet Salina M.D.   On: 01/11/2024 15:31   VAS [place] UPPER EXT VEIN MAPPING (PRE-OP AVF) Result Date: 01/10/2024 UPPER  EXTREMITY VEIN MAPPING Patient Name:  [place] Kula [name]  Date of Exam:   01/09/2024 Medical Rec #: [id number]            Accession #:    [id number] Date of Birth: 1971/06/10            Patient Gender: M Patient Age:   60 years Exam Location[facility]  [name] Procedure:  VAS [place] UPPER EXT VEIN MAPPING (PRE-OP AVF) Referring Phys: JAY PATEL --------------------------------------------------------------------------------  Indications: Pre-access. History: CKD /ESRD.  Comparison Study: No prior study on file Performing Technologist: Sherren Kerns RVS  Examination Guidelines: A complete evaluation includes B-mode imaging, spectral Doppler, color Doppler, and power Doppler as needed of all accessible portions of each vessel. Bilateral testing is considered an integral part of a complete examination. Limited examinations for reoccurring indications may be performed as noted. +-----------------+-------------+----------+---------+ Right Cephalic   Diameter (cm)Depth (cm)Findings  +-----------------+-------------+----------+---------+ Prox upper arm       0.47        0.63             +-----------------+-------------+----------+---------+ Mid upper arm        0.47        0.41             +-----------------+-------------+----------+---------+ Dist upper arm       0.51        0.43             +-----------------+-------------+----------+---------+ Antecubital fossa    0.60        0.31             +-----------------+-------------+----------+---------+ Prox forearm       0.39/0.35  0.33/0.78 branching +-----------------+-------------+----------+---------+ Mid forearm          0.46        0.46             +-----------------+-------------+----------+---------+ Dist forearm         0.38        0.39             +-----------------+-------------+----------+---------+ Wrist                0.30        0.53             +-----------------+-------------+----------+---------+  +-----------------+-------------+----------+---------+ Right Basilic    Diameter (cm)Depth (cm)Findings  +-----------------+-------------+----------+---------+ Prox upper arm       0.65        2.11    origin   +-----------------+-------------+----------+---------+ Mid upper arm        0.52        1.18             +-----------------+-------------+----------+---------+ Dist upper arm       0.40        0.71             +-----------------+-------------+----------+---------+ Antecubital fossa    0.32        0.36             +-----------------+-------------+----------+---------+ Prox forearm         0.34        0.29             +-----------------+-------------+----------+---------+ Mid forearm        0.35/0.25  0.36/0.24 branching +-----------------+-------------+----------+---------+ Distal forearm       0.21        0.42             +-----------------+-------------+----------+---------+ Wrist                0.37        0.58             +-----------------+-------------+----------+---------+ +-----------------+-------------+----------+---------+ Left Cephalic    Diameter (cm)Depth (cm)Findings  +-----------------+-------------+----------+---------+ Prox upper arm       0.65        0.47             +-----------------+-------------+----------+---------+  Mid upper arm        0.48        0.55             +-----------------+-------------+----------+---------+ Dist upper arm       0.63        0.48             +-----------------+-------------+----------+---------+ Antecubital fossa    0.79        0.45             +-----------------+-------------+----------+---------+ Prox forearm       0.49/0.45  0.70/0.62 branching +-----------------+-------------+----------+---------+ Mid forearm          0.42        0.46             +-----------------+-------------+----------+---------+ Dist forearm         0.38        0.46              +-----------------+-------------+----------+---------+ Wrist                0.37        0.58             +-----------------+-------------+----------+---------+ +-----------------+-------------+----------+--------+ Left Basilic     Diameter (cm)Depth (cm)Findings +-----------------+-------------+----------+--------+ Prox upper arm       0.54        0.17    origin  +-----------------+-------------+----------+--------+ Mid upper arm        0.76        0.98            +-----------------+-------------+----------+--------+ Dist upper arm       0.42        0.97            +-----------------+-------------+----------+--------+ Antecubital fossa    0.57        1.22            +-----------------+-------------+----------+--------+ Prox forearm         0.40        0.55            +-----------------+-------------+----------+--------+ Mid forearm          0.21        0.40            +-----------------+-------------+----------+--------+ Distal forearm       0.15        0.25            +-----------------+-------------+----------+--------+ Wrist                0.17        0.38            +-----------------+-------------+----------+--------+ *See table(s) above for measurements and observations.  Diagnosing physician: Gerarda Fraction Electronically signed by Gerarda Fraction on 01/10/2024 at 10:26:10 AM.    Final    US RENAL Result Date: 01/09/2024 CLINICAL DATA:  Acute kidney injury EXAM: RENAL / URINARY TRACT ULTRASOUND COMPLETE COMPARISON:  CT abdomen pelvis 11/02/2023 FINDINGS: Right Kidney: Not well visualized due to adjacent shadowing bowel gas. Left Kidney: Not well visualized due to adjacent shadowing bowel gas. Bladder: Appears normal for degree of bladder distention. Other: None. IMPRESSION: Nonvisualization of the kidneys due to patient body habitus and adjacent shadowing bowel gas. Electronically Signed   By: Acquanetta Belling M.D.   On: 01/09/2024 12:44   DG Chest Port 1  View Result Date: 01/08/2024 CLINICAL DATA:  Shortness of breath EXAM: PORTABLE CHEST 1 VIEW COMPARISON:  Chest x-ray 01/05/2024  FINDINGS: The cardiac silhouette is enlarged, unchanged. Sternotomy wires are present. There is no focal lung infiltrate, pleural effusion or pneumothorax. There stable elevation of the left hemidiaphragm. No acute fractures are seen. IMPRESSION: 1. No active disease. 2. Stable cardiomegaly. Electronically Signed   By: Darliss Cheney M.D.   On: 01/08/2024 19:12   DG Chest 2 View Result Date: 01/05/2024 CLINICAL DATA:  Shortness of breath EXAM: CHEST - 2 VIEW COMPARISON:  Chest radiograph dated 11/09/2023 FINDINGS: Unchanged asymmetric elevation of the left hemidiaphragm. Normal lung volumes. Right basilar patchy opacity. No pleural effusion or pneumothorax. Similar enlarged cardiomediastinal silhouette. Median sternotomy wires are nondisplaced. IMPRESSION: 1. Right basilar patchy opacity, likely atelectasis. 2. Similar cardiomegaly. Electronically Signed   By: Agustin Cree M.D.   On: 01/05/2024 15:18   US Abdomen Limited RUQ (LIVER/GB) Result Date: 12/24/2023 CLINICAL DATA:  Elita Boone EXAM: ULTRASOUND ABDOMEN LIMITED RIGHT UPPER QUADRANT COMPARISON:  CT abdomen pelvis 11/02/2023 FINDINGS: Gallbladder: Surgically absent Common bile duct: Diameter: 4 mm Liver: Increased echogenicity. No focal lesion. Portal vein is patent on color Doppler imaging with normal direction of blood flow towards the liver. Other: None. IMPRESSION: 1. Increased hepatic parenchymal echogenicity suggestive of steatosis. 2. Status post cholecystectomy. Electronically Signed   By: Annia Belt M.D.   On: 12/24/2023 12:47    Microbiology: Recent Results (from the past 240 hours)  Surgical pcr screen     Status: None   Collection Time: 01/11/24  4:51 AM   Specimen: Nasal Mucosa; Nasal Swab  Result Value Ref Range Status   MRSA, PCR NEGATIVE NEGATIVE Final   Staphylococcus aureus NEGATIVE NEGATIVE Final    Comment:  (NOTE) The Xpert SA Assay (FDA approved for NASAL specimens in patients 4 years of age and older), is one component of a comprehensive surveillance program. It is not intended to diagnose infection nor to guide or monitor treatment. Performed at [name], [address], [place], [place] [address]      Labs: Basic Metabolic Panel: Recent Labs  Lab 01/12/24 1545 01/13/24 0941 01/14/24 0336 01/15/24 0334 01/16/24 0310  NA 141 141 141 138 138  K 3.5 3.5 3.4* 3.5 3.4*  CL 106 98 101 103 103  CO2 23 27 26 24 25   GLUCOSE 111* 126* 140* 133* 156*  BUN 64* 49* 55* 63* 50*  CREATININE 7.57* 6.11* 6.57* 6.94* 6.82*  CALCIUM 9.0 9.2 9.0 8.5* 8.6*  PHOS 4.6 5.7* 6.7* 6.0* 5.2*   Liver Function Tests: Recent Labs  Lab 01/12/24 1545 01/13/24 0941 01/14/24 0336 01/15/24 0334 01/16/24 0310  ALBUMIN 2.5* 2.7* 2.4* 2.2* 2.3*   No results for input(s): "LIPASE", "AMYLASE" in the last 168 hours. No results for input(s): "AMMONIA" in the last 168 hours. CBC: Recent Labs  Lab 01/12/24 1545 01/13/24 0941 01/14/24 0336 01/15/24 0334 01/16/24 0310  WBC 10.5 8.6 7.5 8.5 7.0  HGB 11.0* 11.7* 10.9* 10.1* 10.2*  HCT 33.9* 34.9* 33.0* 30.7* 30.9*  MCV 102.4* 102.3* 102.8* 102.0* 100.7*  PLT 73* 70* 66* 63* 62*   Cardiac Enzymes: No results for input(s): "CKTOTAL", "CKMB", "CKMBINDEX", "TROPONINI" in the last 168 hours. BNP: BNP (last 3 results) Recent Labs    11/02/23 1336 01/05/24 1238  BNP 25.4 430.6*    ProBNP (last 3 results) No results for input(s): "PROBNP" in the last 8760 hours.  CBG: No results for input(s): "GLUCAP" in the last 168 hours.     Signed:  Ramiro Harvest MD.  [facility] 01/16/2024, 2:59  PM

## 2024-01-16 NOTE — Progress Notes (Signed)
Received patient in bed to unit.  Alert and oriented.  Informed consent signed and in chart.   TX duration: 2 hours  Patient tolerated well.  Transported back to the room  Alert, without acute distress.  Hand-off given to patient's nurse.   Access used: R internal jugular HD Cath Access issues: none  Total UF removed: 1L Medication(s) given: none   01/16/24 1349  Vitals  Temp 97.9 F (36.6 C)  Temp Source Oral  BP 118/77 (Simultaneous filing. User may not have seen previous data.)  Pulse Rate 62  ECG Heart Rate 63  Resp 15  MEWS COLOR  MEWS Score Color Green  Oxygen Therapy  SpO2 97 %  O2 Device Room Air  MEWS Score  MEWS Temp 0  MEWS Systolic 0  MEWS Pulse 0  MEWS RR 0  MEWS LOC 0  MEWS Score 0     Stacie Glaze LPN [facility]

## 2024-01-16 NOTE — Plan of Care (Signed)

## 2024-01-16 NOTE — Progress Notes (Signed)
Patient ID: Sean [name], male   DOB: 08-Feb-1971, 53 y.o.   MRN: [id number] [facility] Progress Note   Assessment/ Plan:   1.  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  He is willing to start dialysis and was seen earlier by vascular surgery and is on the schedule for [facility] with likely left BCF .  He will have his first dialysis treatment done today and a second tomorrow.  We will begin the process for outpatient dialysis unit placement. - HD #1 01/11/24 - HD #2 01/12/24 - HD #3 01/15/24  - CLIP complete- can start at OP unit on Tuesday -  can go after HD today 2.  Hypertension: Blood pressures currently elevated, monitor with dialysis. Lopressor only 3.  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  Monitor for bleeding diathesis. Followed as OP by heme 4.  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 On phoslo-  phos is still high-  will inc dose  5. Anemia-  hgb over 10-  no action needed as of yet  Subjective:    Short HD today.  Sleeping, no issues this AM   Objective:   BP (!) 145/68 (BP Location: Right Wrist)   Pulse 69   Temp 97.8 F (36.6 C) (Axillary)   Resp 19   Ht 5\' 11"  (1.803 m)   Wt (!) 176.6 kg   SpO2 96%   BMI 54.30 kg/m   Intake/Output Summary (Last 24 hours) at 01/16/2024 1039 Last data filed at 01/15/2024 1527 Gross per 24 hour  Intake --  Output 4000 ml  Net -4000 ml    Weight change: -1.9 kg  Physical Exam: Gen: Obese man, comfortably sitting on the edge of his bed  TDC in place CVS: Regular rhythm, normal rate S1 and S2  Resp: Diminished breath sounds over bases, no rales/rhonchi Abd: Soft, obese, nontender,  bowel sounds normal Ext: 2+ bilateral pitting edema  left AVF-  can appreciate bruit with steth  Imaging: No results found.   Labs: BMET Recent Labs  Lab 01/10/24 0317 01/11/24 0950 01/12/24 1545 01/13/24 0941 01/14/24 0336 01/15/24 0334 01/16/24 0310  NA  --  144 141 141 141 138 138  K  --  3.9 3.5 3.5 3.4* 3.5 3.4*  CL  --  108 106 98 101 103 103  CO2  --   --  23 27 26 24 25   GLUCOSE  --  124* 111* 126* 140* 133* 156*  BUN  --  59* 64* 49* 55* 63* 50*  CREATININE  --  8.50* 7.57* 6.11* 6.57* 6.94* 6.82*  CALCIUM  --   --  9.0 9.2 9.0 8.5* 8.6*  PHOS 4.8*  --  4.6 5.7* 6.7* 6.0* 5.2*   CBC Recent Labs  Lab 01/13/24 0941 01/14/24 0336 01/15/24 0334 01/16/24 0310  WBC 8.6 7.5 8.5 7.0  HGB 11.7* 10.9* 10.1* 10.2*  HCT 34.9* 33.0* 30.7* 30.9*  MCV 102.3* 102.8* 102.0* 100.7*  PLT 70* 66* 63* 62*    Medications:     (feeding supplement) PROSource Plus  30 mL Oral BID BM   allopurinol  100 mg Oral Daily   amitriptyline  25 mg Oral QHS   apixaban  5 mg Oral BID   calcium acetate  1,334 mg Oral TID WC  diclofenac Sodium  2 g Topical QID   empagliflozin  10 mg Oral Daily   famotidine  20 mg Oral Daily   metoCLOPramide (REGLAN) injection  10 mg Intravenous Once   metoprolol tartrate  25 mg Oral BID   montelukast  10 mg Oral Daily   multivitamin  1 tablet Oral QHS   pantoprazole  40 mg Oral BID   polyethylene glycol  17 g Oral BID   senna-docusate  1 tablet Oral BID    Kewan Mcnease  01/16/2024, 10:39 AM

## 2024-01-17 NOTE — Discharge Planning (Signed)
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  Hx A-flutter with RVR/SVT - patient spontaneously converted while straining for bowel movement. On Lopressor and Eliquis per Cards HTN - On Lopressor and Diltiazem stopped   Allergies:  Allergies  Allergen Reactions   Glucophage [Metformin] Other (See Comments)    Pt told by his nephrologist not to take this medication due to his kidney function.   Vibra-Tab [Doxycycline] Shortness Of Breath   Chlorhexidine Gluconate Itching and Other (See Comments)    burning   Augmentin [Amoxicillin-Pot Clavulanate] Itching   Imdur [Isosorbide Dinitrate] Diarrhea   Tape Rash   Valtrex [Valacyclovir] Itching    Date of First Dialysis:  - HD #1 01/11/24 - HD #2 01/12/24 - HD #3 01/15/24  Cause of renal disease: Likely in the setting of CHF/nonalcoholic steatohepatitis/cirrhosis.   Dialysis Prescription: Dialysis Frequency: TTS Tx duration: 3.5 hrs then 4 hrs  BFR: 350 then 400  DFR: 500 then 600 EDW: 173.3kg  Dialyzer: 180NRe UF profile/Sodium modeling?: None Dialysis Bath: 3 K/2.5 Ca  Dialysis access: Access type: RIJ TDC and L AVF  Date placed: 01/11/24  Surgeon: Dr. Karin Lieu *His [facility] should be used for dialysis for the next couple of months while his fistula matures*  In Center Medications: Heparin Dose: Hold Heparin for now  VDRA: Initiate Calcitriol per  protocol Venofer: Per protocol Doesn't appear Aranesp was given here. Start Micera per protocol 5. Continue Calcium Acetate 2 tablets with meals. Ensure RD is aware of this.  Discharge labs: Hgb: 10.2  K+: 3.4  Ca: 8.6  Phos: 5.2  Alb: 2.3 Plts: 62K  Please draw routine labs. Additional labs needed: Please check weekly K+ levels

## 2024-01-17 NOTE — TOC Transition Note (Signed)
Transition of Care - Initial Contact from Inpatient Facility  Date of discharge: 01/16/24 Date of contact: 01/17/24 Method: Phone Spoke to: Patient  Patient contacted to discuss transition of care from recent inpatient hospitalization. Patient was admitted to [facility] from 2.7/25-2/15/25 with discharge diagnosis of new ESRD and HD was initiated. He reports an upset stomach after leaving the hospital yesterday but he contributed to eating a whooper without cheese at [employer]. He reports feeling much better today.  The discharge medication list was reviewed. Patient understands the changes and has no concerns. I discussed with him and his Aunt on a proper renal diet in addition to sodium restriction.  Patient will continue outpatient dialysis on 01/19/24 at [facility].  Salome Holmes, NP

## 2024-01-18 ENCOUNTER — Telehealth: Payer: Self-pay | Admitting: *Deleted

## 2024-01-18 ENCOUNTER — Encounter: Payer: Self-pay | Admitting: *Deleted

## 2024-01-18 ENCOUNTER — Inpatient Hospital Stay: Payer: BC Managed Care – PPO | Attending: Oncology

## 2024-01-18 ENCOUNTER — Other Ambulatory Visit: Payer: Self-pay

## 2024-01-18 ENCOUNTER — Telehealth: Payer: Self-pay | Admitting: Internal Medicine

## 2024-01-18 DIAGNOSIS — D472 Monoclonal gammopathy: Secondary | ICD-10-CM

## 2024-01-18 DIAGNOSIS — D693 Immune thrombocytopenic purpura: Secondary | ICD-10-CM | POA: Diagnosis not present

## 2024-01-18 DIAGNOSIS — D6959 Other secondary thrombocytopenia: Secondary | ICD-10-CM | POA: Diagnosis not present

## 2024-01-18 DIAGNOSIS — I4892 Unspecified atrial flutter: Secondary | ICD-10-CM

## 2024-01-18 DIAGNOSIS — L299 Pruritus, unspecified: Secondary | ICD-10-CM | POA: Insufficient documentation

## 2024-01-18 DIAGNOSIS — D508 Other iron deficiency anemias: Secondary | ICD-10-CM | POA: Insufficient documentation

## 2024-01-18 DIAGNOSIS — D631 Anemia in chronic kidney disease: Secondary | ICD-10-CM | POA: Insufficient documentation

## 2024-01-18 DIAGNOSIS — D509 Iron deficiency anemia, unspecified: Secondary | ICD-10-CM | POA: Insufficient documentation

## 2024-01-18 DIAGNOSIS — G8929 Other chronic pain: Secondary | ICD-10-CM | POA: Insufficient documentation

## 2024-01-18 DIAGNOSIS — R0602 Shortness of breath: Secondary | ICD-10-CM | POA: Insufficient documentation

## 2024-01-18 DIAGNOSIS — E8779 Other fluid overload: Secondary | ICD-10-CM | POA: Insufficient documentation

## 2024-01-18 DIAGNOSIS — D689 Coagulation defect, unspecified: Secondary | ICD-10-CM | POA: Insufficient documentation

## 2024-01-18 LAB — CBC WITH DIFFERENTIAL (CANCER CENTER ONLY)
Abs Immature Granulocytes: 0.07 10*3/uL (ref 0.00–0.07)
Basophils Absolute: 0.1 10*3/uL (ref 0.0–0.1)
Basophils Relative: 1 %
Eosinophils Absolute: 0.6 10*3/uL — ABNORMAL HIGH (ref 0.0–0.5)
Eosinophils Relative: 8 %
HCT: 33.4 % — ABNORMAL LOW (ref 39.0–52.0)
Hemoglobin: 11.4 g/dL — ABNORMAL LOW (ref 13.0–17.0)
Immature Granulocytes: 1 %
Immature Platelet Fraction: 0.3 % — ABNORMAL LOW (ref 1.2–8.6)
Lymphocytes Relative: 26 %
Lymphs Abs: 1.9 10*3/uL (ref 0.7–4.0)
MCH: 34.2 pg — ABNORMAL HIGH (ref 26.0–34.0)
MCHC: 34.1 g/dL (ref 30.0–36.0)
MCV: 100.3 fL — ABNORMAL HIGH (ref 80.0–100.0)
Monocytes Absolute: 1.2 10*3/uL — ABNORMAL HIGH (ref 0.1–1.0)
Monocytes Relative: 16 %
Neutro Abs: 3.6 10*3/uL (ref 1.7–7.7)
Neutrophils Relative %: 48 %
Platelet Count: 70 10*3/uL — ABNORMAL LOW (ref 150–400)
RBC: 3.33 MIL/uL — ABNORMAL LOW (ref 4.22–5.81)
RDW: 14.8 % (ref 11.5–15.5)
WBC Count: 7.4 10*3/uL (ref 4.0–10.5)
nRBC: 0 % (ref 0.0–0.2)
nRBC: 0 /100{WBCs}

## 2024-01-18 LAB — IRON AND TIBC
Iron: 152 ug/dL (ref 45–182)
Saturation Ratios: 33 % (ref 17.9–39.5)
TIBC: 456 ug/dL — ABNORMAL HIGH (ref 250–450)
UIBC: 304 ug/dL

## 2024-01-18 LAB — VITAMIN B12: Vitamin B-12: 6881 pg/mL — ABNORMAL HIGH (ref 180–914)

## 2024-01-18 LAB — FERRITIN: Ferritin: 114 ng/mL (ref 24–336)

## 2024-01-18 MED ORDER — APIXABAN 5 MG PO TABS: 5.0000 mg | ORAL_TABLET | Freq: Two times a day (BID) | ORAL | 1 refills | Status: DC

## 2024-01-18 NOTE — Transitions of Care (Post Inpatient/ED Visit) (Signed)
01/18/2024  Name: Sean [name] MRN: [id number] DOB: 05-14-1971  Today's TOC FU Call Status: Today's TOC FU Call Status:: Successful TOC FU Call Completed TOC FU Call Complete Date: 01/18/24 Patient's Name and Date of Birth confirmed.  Transition Care Management Follow-up Telephone Call Date of Discharge: 01/16/24 Discharge Facility: [facility]) Type of Discharge: Inpatient Admission Primary Inpatient Discharge Diagnosis:: ESRD on dialysis How have you been since you were released from the hospital?: Better Any questions or concerns?: No  Items Reviewed: Did you receive and understand the discharge instructions provided?: Yes Medications obtained,verified, and reconciled?: Yes (Medications Reviewed) Any new allergies since your discharge?: No Dietary orders reviewed?: Yes Type of Diet Ordered:: heart healthy/  renal Do you have support at home?: Yes People in Home: parent(s) Name of Support/Comfort Primary Source: lives w/ parents Patient declined enrollment in [facility] 30 day program, pt states he has a lot going on with appointments, dialysis  Medications Reviewed Today: Medications Reviewed Today     Reviewed by Audrie Gallus, RN (Registered Nurse) on 01/18/24 at 1135  Med List Status: <None>   Medication Order Taking? Sig Documenting Provider Last Dose Status Informant  acetaminophen (TYLENOL) 500 MG tablet [id number] Yes Take 1,500 mg by mouth once as needed for moderate pain (pain score 4-6), fever, headache or mild pain (pain score 1-3). [provider] Taking Active Self, Pharmacy Records           Med Note Kandis Cocking Alinda Dooms A   Sat Jan 09, 2024  2:32 PM)    allopurinol (ZYLOPRIM) 100 MG tablet [id number] Yes Take 1 tablet (100 mg total) by mouth daily. Leroy Sea, MD Taking Active Self, Pharmacy Records  amitriptyline (ELAVIL) 25 MG tablet [id number] Yes Take 1 tablet (25 mg total) by mouth at bedtime.  Patient taking differently: Take 25 mg  by mouth at bedtime.   Arnaldo Natal, NP Taking Active Self, Pharmacy Records  apixaban (ELIQUIS) 5 MG TABS tablet [id number] Yes Take 1 tablet (5 mg total) by mouth 2 (two) times daily. Christell Constant, MD Taking Active Self, Pharmacy Records  azelastine (ASTELIN) 0.1 % nasal spray [id number] Yes Place 2 sprays into both nostrils 2 (two) times daily. Use in each nostril as directed  Patient taking differently: Place 2 sprays into both nostrils as needed for allergies. Use in each nostril as directed   Read Drivers, MD Taking Active Self, Pharmacy Records  calcium acetate [facility]) 667 MG capsule [id number] Yes Take 2 capsules (1,334 mg total) by mouth 3 (three) times daily with meals. Rodolph Bong, MD Taking Active   Cholecalciferol (VITAMIN D3) 125 MCG (5000 UT) CAPS [id number] Yes Take 1 capsule (5,000 Units total) by mouth daily at 12 noon. Dulce Sellar, NP Taking Active Self, Pharmacy Records  Cyanocobalamin (VITAMIN B-12 PO) [id number] Yes Place 1 tablet under the tongue daily. [provider] Taking Active Self, Pharmacy Records  diclofenac Sodium (VOLTAREN) 1 % GEL [id number] Yes Apply 2 g topically 4 (four) times daily for 7 days, THEN 2 g 4 (four) times daily as needed. Rodolph Bong, MD Taking Active   docusate sodium (COLACE) 100 MG capsule [id number] Yes Take 2 capsules (200 mg total) by mouth 2 (two) times daily.  Patient taking differently: Take 200 mg by mouth as needed for moderate constipation.   Leroy Sea, MD Taking Active Self, Pharmacy Records  Evolocumab [facility] SURECLICK) 140 MG/ML Ivory Broad [id number] Yes INJECT THE CONTENTS OF 1 SYRINGE  UNDER THE SKIN EVERY 14 DAYS Chandrasekhar, Rondel Jumbo, MD Taking Active Self, Pharmacy Records           Med Note Kandis Cocking Alinda Dooms A   Sat Jan 09, 2024  3:07 PM) Pt states that he is due for medication this weekend  famotidine (PEPCID) 20 MG tablet [id number] Yes Take 1 tablet (20 mg total) by  mouth 2 (two) times daily. Arnaldo Natal, NP Taking Active Self, Pharmacy Records  famotidine-calcium carbonate-magnesium hydroxide [facility] COMPLETE) 10-800-165 MG chewable tablet [id number] Yes Chew 1 tablet by mouth 2 (two) times daily as needed. Lula Olszewski, MD Taking Active Self, Pharmacy Records  fluticasone [facility]) 50 MCG/ACT nasal spray [id number] Yes Place 2 sprays into both nostrils daily. Read Drivers, MD Taking Active Self, Pharmacy Records           Med Note [facility] Alinda Dooms A   Sat Jan 09, 2024  3:08 PM) prn  ketoconazole (NIZORAL) 2 % cream [id number] Yes Apply 1 Application topically 2 (two) times daily. Lula Olszewski, MD Taking Active Self, Pharmacy Records  methocarbamol (ROBAXIN) 500 MG tablet [id number] Yes Take 1 tablet (500 mg total) by mouth every 8 (eight) hours as needed for muscle spasms (shoulder pain). Rodolph Bong, MD Taking Active   metoprolol tartrate (LOPRESSOR) 25 MG tablet [id number] Yes Take 1 tablet (25 mg total) by mouth 2 (two) times daily. Christell Constant, MD Taking Active Self, Pharmacy Records  montelukast (SINGULAIR) 10 MG tablet [id number] Yes TAKE 1 TABLET(10 MG) BY MOUTH DAILY  Patient taking differently: Take 10 mg by mouth daily.   Lula Olszewski, MD Taking Active Self, Pharmacy Records  nitroGLYCERIN (NITROSTAT) 0.4 MG SL tablet [id number] Yes Place 1 tablet (0.4 mg total) under the tongue every 5 (five) minutes as needed for chest pain. Christell Constant, MD Taking Active Self, Pharmacy Records           Med Note Kandis Cocking Alinda Dooms A   Sat Jan 09, 2024  2:55 PM)    ondansetron (ZOFRAN-ODT) 4 MG disintegrating tablet [id number] Yes Take 1 tablet (4 mg total) by mouth every 8 (eight) hours as needed for nausea or vomiting. Lula Olszewski, MD Taking Active Self, Pharmacy Records  pantoprazole (PROTONIX) 40 MG tablet [id number] Yes Take 1 tablet (40 mg total) by mouth 2 (two) times daily before a meal.  Rodolph Bong, MD Taking Active   polyethylene glycol (MIRALAX / GLYCOLAX) 17 g packet [id number] Yes Mix 17 grams (1 packet) in 8 ounces of fluid and take by mouth daily.  Patient taking differently: Take 17 g by mouth daily as needed for moderate constipation.   Leroy Sea, MD Taking Active Self, Pharmacy Records  sucralfate (CARAFATE) 1 g tablet [id number] Yes Take 1 tablet (1 g total) by mouth at bedtime. Christell Constant, MD Taking Active Self, Pharmacy Records  triamcinolone (NASACORT) 55 MCG/ACT AERO nasal inhaler [id number] Yes Place 1 spray into the nose daily. Start with 1 spray each side twice a day for 3 days, then reduce to daily. Dulce Sellar, NP Taking Active Self, Pharmacy Records           Med Note Kandis Cocking Alinda Dooms A   Sat Jan 09, 2024  3:08 PM) prn            Home Care and Equipment/Supplies: Were Home Health Services Ordered?: No Any new equipment or medical supplies ordered?: No  Functional Questionnaire: Do you need assistance  with bathing/showering or dressing?: No Do you need assistance with meal preparation?: No Do you need assistance with eating?: No Do you have difficulty maintaining continence: No Do you need assistance with getting out of bed/getting out of a chair/moving?: No Do you have difficulty managing or taking your medications?: No  Follow up appointments reviewed: PCP Follow-up appointment confirmed?: Yes Date of PCP follow-up appointment?: 02/05/24 Follow-up Provider: Dr. Jon Billings  @ 8 am Specialist Hospital Follow-up appointment confirmed?: Yes Date of Specialist follow-up appointment?: 02/01/24 Follow-Up Specialty Provider:: Cardiologist   Eligha Bridegroom  NP  @ 220 pm Do you need transportation to your follow-up appointment?: No Do you understand care options if your condition(s) worsen?: Yes-patient verbalized understanding  SDOH Interventions Today    Flowsheet Row Most Recent Value  SDOH Interventions    Food Insecurity Interventions Intervention Not Indicated  Housing Interventions Intervention Not Indicated  Transportation Interventions Intervention Not Indicated  Utilities Interventions Intervention Not Indicated       Irving Shows [facility], BSN RN Care Manager/ Transition of Care [place]/ [facility] Population Health [phone]

## 2024-01-18 NOTE — Transitions of Care (Post Inpatient/ED Visit) (Signed)
   01/18/2024  Name: Sean [name] MRN: [id number] DOB: 03-27-1971  Today's TOC FU Call Status: Today's TOC FU Call Status:: Unsuccessful Call (1st Attempt) Unsuccessful Call (1st Attempt) Date: 01/18/24  Attempted to reach the patient regarding the most recent Inpatient/ED visit.  Follow Up Plan: Additional outreach attempts will be made to reach the patient to complete the Transitions of Care (Post Inpatient/ED visit) call.   Irving Shows [facility], BSN RN Care Manager/ Transition of Care [place]/ [facility] [phone]

## 2024-01-18 NOTE — Transitions of Care (Post Inpatient/ED Visit) (Signed)
   01/18/2024  Name: Sean [name] MRN: [id number] DOB: [birth year]  Today's TOC FU Call Status:    Attempted to reach the patient regarding the most recent Inpatient/ED visit. Pt called back, RN CM unable to reach pt.  Follow Up Plan: Additional outreach attempts will be made to reach the patient to complete the Transitions of Care (Post Inpatient/ED visit) call.   Irving Shows [facility], BSN RN Care Manager/ Transition of Care [place]/ [facility] [phone]

## 2024-01-18 NOTE — Telephone Encounter (Signed)
 *  STAT* If patient is at the pharmacy, call can be transferred to refill team.   1. Which medications need to be refilled? (please list name of each medication and dose if known)   apixaban (ELIQUIS) 5 MG TABS tablet   2. Which pharmacy/location (including street and city if local pharmacy) is medication to be sent to?  [id number] - [place], [place] - [address]    3. Do they need a 30 day or 90 day supply? 90 days  Patient is completely out of medication

## 2024-01-18 NOTE — Telephone Encounter (Signed)
Prescription refill request for Eliquis received. Indication:aflutter Last office visit:1/25 Scr:8.5  2/25 Age: 53 Weight:173.3  kg  Prescription refilled

## 2024-01-19 DIAGNOSIS — N2581 Secondary hyperparathyroidism of renal origin: Secondary | ICD-10-CM | POA: Diagnosis not present

## 2024-01-19 DIAGNOSIS — N186 End stage renal disease: Secondary | ICD-10-CM | POA: Diagnosis not present

## 2024-01-19 DIAGNOSIS — Z992 Dependence on renal dialysis: Secondary | ICD-10-CM | POA: Diagnosis not present

## 2024-01-19 LAB — KAPPA/LAMBDA LIGHT CHAINS
Kappa free light chain: 1099.9 mg/L — ABNORMAL HIGH (ref 3.3–19.4)
Kappa, lambda light chain ratio: 33.13 — ABNORMAL HIGH (ref 0.26–1.65)
Lambda free light chains: 33.2 mg/L — ABNORMAL HIGH (ref 5.7–26.3)

## 2024-01-19 NOTE — Progress Notes (Signed)
Late Note Entry- Jan 19, 2024  Pt was d/c on Saturday. Contacted [facility] this morning to be advised of pt's d/c date and that pt should start today. Clinic received orders.   Olivia Canter Renal Navigator [phone]

## 2024-01-20 DIAGNOSIS — N2581 Secondary hyperparathyroidism of renal origin: Secondary | ICD-10-CM | POA: Diagnosis not present

## 2024-01-20 DIAGNOSIS — Z992 Dependence on renal dialysis: Secondary | ICD-10-CM | POA: Diagnosis not present

## 2024-01-20 DIAGNOSIS — N186 End stage renal disease: Secondary | ICD-10-CM | POA: Diagnosis not present

## 2024-01-21 LAB — MULTIPLE MYELOMA PANEL, SERUM
Albumin SerPl Elph-Mcnc: 2.8 g/dL — ABNORMAL LOW (ref 2.9–4.4)
Albumin/Glob SerPl: 1 (ref 0.7–1.7)
Alpha 1: 0.3 g/dL (ref 0.0–0.4)
Alpha2 Glob SerPl Elph-Mcnc: 0.6 g/dL (ref 0.4–1.0)
B-Globulin SerPl Elph-Mcnc: 1.7 g/dL — ABNORMAL HIGH (ref 0.7–1.3)
Gamma Glob SerPl Elph-Mcnc: 0.4 g/dL (ref 0.4–1.8)
Globulin, Total: 3.1 g/dL (ref 2.2–3.9)
IgA: 1203 mg/dL — ABNORMAL HIGH (ref 90–386)
IgG (Immunoglobin G), Serum: 561 mg/dL — ABNORMAL LOW (ref 603–1613)
IgM (Immunoglobulin M), Srm: 91 mg/dL (ref 20–172)
M Protein SerPl Elph-Mcnc: 0.9 g/dL — ABNORMAL HIGH
Total Protein ELP: 5.9 g/dL — ABNORMAL LOW (ref 6.0–8.5)

## 2024-01-23 DIAGNOSIS — N186 End stage renal disease: Secondary | ICD-10-CM | POA: Diagnosis not present

## 2024-01-23 DIAGNOSIS — Z992 Dependence on renal dialysis: Secondary | ICD-10-CM | POA: Diagnosis not present

## 2024-01-23 DIAGNOSIS — N2581 Secondary hyperparathyroidism of renal origin: Secondary | ICD-10-CM | POA: Diagnosis not present

## 2024-01-26 ENCOUNTER — Other Ambulatory Visit: Payer: Self-pay | Admitting: Internal Medicine

## 2024-01-26 ENCOUNTER — Other Ambulatory Visit: Payer: Self-pay | Admitting: Nurse Practitioner

## 2024-01-26 DIAGNOSIS — I4892 Unspecified atrial flutter: Secondary | ICD-10-CM

## 2024-01-26 DIAGNOSIS — N186 End stage renal disease: Secondary | ICD-10-CM | POA: Diagnosis not present

## 2024-01-26 DIAGNOSIS — N2581 Secondary hyperparathyroidism of renal origin: Secondary | ICD-10-CM | POA: Diagnosis not present

## 2024-01-26 DIAGNOSIS — Z992 Dependence on renal dialysis: Secondary | ICD-10-CM | POA: Diagnosis not present

## 2024-01-26 DIAGNOSIS — E441 Mild protein-calorie malnutrition: Secondary | ICD-10-CM | POA: Insufficient documentation

## 2024-01-26 NOTE — Telephone Encounter (Signed)
 Prescription refill request for Eliquis received. Indication:aflutter Last office visit:1/25 Scr:8.50  2/25 Age:53  Weight:173.3  kg  Prescription refilled

## 2024-01-28 DIAGNOSIS — N2581 Secondary hyperparathyroidism of renal origin: Secondary | ICD-10-CM | POA: Diagnosis not present

## 2024-01-28 DIAGNOSIS — N186 End stage renal disease: Secondary | ICD-10-CM | POA: Diagnosis not present

## 2024-01-28 DIAGNOSIS — Z992 Dependence on renal dialysis: Secondary | ICD-10-CM | POA: Diagnosis not present

## 2024-01-29 ENCOUNTER — Other Ambulatory Visit: Payer: Self-pay | Admitting: Oncology

## 2024-01-29 ENCOUNTER — Inpatient Hospital Stay: Payer: BC Managed Care – PPO | Admitting: Oncology

## 2024-01-29 VITALS — BP 144/81 | HR 64 | Temp 98.4°F | Resp 16 | Ht 71.0 in | Wt 381.3 lb

## 2024-01-29 DIAGNOSIS — D472 Monoclonal gammopathy: Secondary | ICD-10-CM

## 2024-01-29 DIAGNOSIS — N186 End stage renal disease: Secondary | ICD-10-CM | POA: Diagnosis not present

## 2024-01-29 DIAGNOSIS — Z992 Dependence on renal dialysis: Secondary | ICD-10-CM | POA: Diagnosis not present

## 2024-01-29 DIAGNOSIS — I509 Heart failure, unspecified: Secondary | ICD-10-CM | POA: Diagnosis not present

## 2024-01-29 NOTE — Progress Notes (Signed)
 Cardiology Office Note:  .   Date:  02/01/2024  ID:  Sean [name], DOB 1971/04/07, MRN [id number] PCP: Lula Olszewski, MD  [facility] Providers Cardiologist:  Christell Constant, MD    Patient Profile: .      PMH Coronary artery disease S/p CABG x 1 (LIMA-LAD) 12/2015 Chronic kidney disease Stage V Ischemic cardiomyopathy PAF on chronic anticoagulation Morbid obesity Hypertension MASH (metabolic dysfunction-associated steatohepatitis OSA Hyperlipidemia Idiopathic thrombocytopenia  He underwent CABG times 02 December 2015 (LIMA-LAD). In March 2017, he presented with atrial flutter, RVR.  He was placed on OAC and underwent successful TEE, negative for LAA thrombus, followed by DCCV back to NSR.  EF was estimated at 35 to 40%.  Unfortunately, 2 weeks later he had recurrent atrial flutter RVR.  He underwent successful DC cardioversion with restoration of sinus rhythm.  He was encouraged to improve compliance with CPAP and undergo repeat sleep study.  Previously followed by Dr. Delton See, he established with Dr. Raynelle Jan in 2022.  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  Previously on [place] but these were discontinued due to dehydration and sickness.  He restarted Jardiance on his own due to swelling and fluid retention which he attributes to his kidneys rather than his liver.  He has gained significant weight from 380 to 400 pounds, suspected to be due to fluid retention.  Last cardiology clinic visit was 01/01/2024 with Dr. Raynelle Jan.  He reported being diagnosed with idiopathic thrombocytopenia and metabolic dysfunction associated with steatohepatitis.  A recent liver biopsy was unremarkable, showing fatty deposits but no cancer.  He was evaluated by the liver transplant team but determined liver transplant not needed at this time.  Due to worsening kidney function with a recent increase in creatinine from 5-6, he was planning for dialysis.  He  was continued on current medications with hesitancy to add additional antihypertensives in the setting of likely hypotension following dialysis.  Admission 2/7-2/15/25 for nausea and poor appetite.  For the previous 6 days he started vomiting and would throw up as often as 3 times a day sometimes not at all.  He was still making urine. Creatinine was 7.17 and BUN was 65.  Cardiology was evaluated for tachycardia.  He was found to have paroxysmal SVT and prior to undergoing chemical cardioversion with adenosine, he had spontaneous return to NSR while having a bowel movement.  He was started on hemodialysis with first session occurring on 01/11/2024.  He was maintained on Eliquis for anticoagulation.       History of Present Illness: Marland Kitchen   Sean [name] is a pleasant 53 y.o. male who is here today for hospital follow-up and is accompanied by his aunt. He reports he is feeling well with no acute concerns today.  He has noted palpitations almost daily, with episodes lasting up to 10 minutes.  He denies lightheadedness, presyncope, or syncope.  Is having hemodialysis on Tuesdays Thursdays and Saturdays.  Reports by their report he is down 20 lb.  He is tolerating dialysis well without any concerning symptoms.  Feels tired for a few hours afterward but this improves with rest.  He denies worsening edema.  He has been sleeping in a recliner for several years and denies orthopnea or PND.  No chest pain. He lives with his mother and father and reports his mother tries to cook healthy but his father refuses to eat a healthy diet.  His daily activity level is  difficult to discern.  Discussed the use of AI scribe software for clinical note transcription with the patient, who gave verbal consent to proceed.   ROS: See HPI       Studies Reviewed: .        Risk Assessment/Calculations:    CHA2DS2-VASc Score = 3   This indicates a 3.2% annual risk of stroke. The patient's score is based upon: CHF History:  1 HTN History: 1 Diabetes History: 0 Stroke History: 0 Vascular Disease History: 1 Age Score: 0 Gender Score: 0            Physical Exam:   VS:  BP 138/72   Pulse 65   Ht 5\' 11"  (1.803 m)   Wt (!) 381 lb 3.2 oz (172.9 kg)   SpO2 95%   BMI 53.17 kg/m    Wt Readings from Last 3 Encounters:  02/01/24 (!) 381 lb 3.2 oz (172.9 kg)  01/29/24 (!) 381 lb 4.8 oz (173 kg)  01/16/24 (!) 382 lb 0.9 oz (173.3 kg)    GEN: Well nourished, well developed in no acute distress NECK: No JVD; No carotid bruits CARDIAC: RRR, no murmurs, rubs, gallops RESPIRATORY:  Clear to auscultation without rales, wheezing or rhonchi  ABDOMEN: Soft, non-tender, non-distended EXTREMITIES:  No edema; No deformity     ASSESSMENT AND PLAN: .    SVT/Atrial flutter: Prolonged episode of SVT on 01/14/2024 during hospitalization.  Today, HR is well-controlled.  He reports daily palpitations that at times feel like they last up to 10 minutes.  He is unsure of his pulse rate during these episodes.  No presyncope or syncope. We discussed metoprolol and Cardizem which she is taking for rate control.  Advised him to take additional metoprolol 25 mg for sustained HR > 130 bpm, sooner if he develops dizziness or other concerning symptoms. Advised him to notify us if this is occurring frequently.  He remains on Eliquis 5 mg twice daily which is appropriate dose for stroke prevention for CHA2DS2-VASc score of 3. No bleeding concerns.  Continue metoprolol and Cardizem for rate control.  CAD: History of CABG x 1 in 2017. He denies chest pain, dyspnea, or other symptoms concerning for angina.  No indication for further ischemic evaluation at this time.   Hyperlipidemia LDL goal < 55: Lipid panel 05/04/2023 with total cholesterol 173, triglycerides 132, HDL 53.7, LDL 93.  He reports compliance with Repatha. Encouraged heart healthy diet avoiding saturated fat, processed foods, sugar, simple carbohydrates and encouraged increased physical  activity.   ESRD on HD: Recently started dialysis Tues/Thurs/Sat.  He reports he is tolerating HD well with no major concerns.  He is generally tired following his sessions but recovers within a couple of hours.  He feels LE edema is stable. Management per nephrology.   Hypertension: He reports blood pressure has been well-controlled throughout dialysis. Management per nephrology.        Disposition:3-4 months with Dr. Fanny Dance  Signed, Eligha Bridegroom, NP-C

## 2024-01-29 NOTE — Progress Notes (Signed)
 [facility]  [address] [place],  [place]  [address] [phone]  Clinic Day:  01/29/2024  Referring physician: Anthon Kins, MD   HISTORY OF PRESENT ILLNESS:  The patient is a 53 y.o. 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  He also has  MGUS.  A bone marrow biopsy done in May 2024 revealed 7% plasma cells.  These hematologic disorders have been followed conservatively as they have not led to any changes in his health.  He comes in today for routine follow-up.  Unfortunately, since his last visit, the patient was hospitalized for acute end-stage renal disease, which led to him being placed on dialysis.  Overall, the patient remains in good spirits despite this recent diagnosis.  Furthermore, since his last visit, the patient has had an outside abdominal ultrasound which showed evidence of fatty liver disease and secondary splenomegaly.  These findings are the likely reason behind his baseline thrombocytopenia.  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  PHYSICAL EXAM:  Blood pressure (!) 144/81, pulse 64, temperature 98.4 F (36.9 C), temperature source Oral, resp. rate 16, height 5\' 11"  (1.803 m), weight (!) 381 lb 4.8 oz (173 kg), SpO2 99%.Body mass index is 53.18 kg/m.  Performance status (ECOG): 1 - Symptomatic but completely ambulatory Physical Exam Constitutional:      Appearance: Normal appearance. He is obese. He is not ill-appearing.  HENT:     Mouth/Throat:     Mouth: Mucous membranes are moist.     Pharynx: Oropharynx is clear. No oropharyngeal exudate or posterior oropharyngeal erythema.  Cardiovascular:     Rate and Rhythm: Regular rhythm. Tachycardia present.     Heart sounds: No murmur heard.    No friction rub. No gallop.  Pulmonary:     Effort: Pulmonary effort is normal. No respiratory  distress.     Breath sounds: Normal breath sounds. No wheezing, rhonchi or rales.  Abdominal:     General: Bowel sounds are normal. There is no distension.     Palpations: Abdomen is soft. There is no mass.     Tenderness: There is no abdominal tenderness.  Musculoskeletal:        General: No swelling.     Right lower leg: No edema.     Left lower leg: No edema.  Lymphadenopathy:     Cervical: No cervical adenopathy.     Upper Body:     Right upper body: No supraclavicular or axillary adenopathy.     Left upper body: No supraclavicular or axillary adenopathy.     Lower Body: No right inguinal adenopathy. No left inguinal adenopathy.  Skin:    General: Skin is warm.     Coloration: Skin is not jaundiced.     Findings: No lesion or rash.  Neurological:     General: No focal deficit present.     Mental Status: He is alert and oriented to person, place, and time. Mental status is at baseline.  Psychiatric:        Mood and Affect: Mood normal.        Behavior: Behavior normal.        Thought Content: Thought content normal.   LABS:  Latest Reference Range & Units 01/18/24 08:51  WBC 4.0 - 10.5 K/uL 7.4  RBC 4.22 - 5.81 MIL/uL 3.33 (L)  Hemoglobin 13.0 -  17.0 g/dL 41.3 (L)  HCT 24.4 - 01.0 % 33.4 (L)  MCV 80.0 - 100.0 fL 100.3 (H)  MCH 26.0 - 34.0 pg 34.2 (H)  MCHC 30.0 - 36.0 g/dL 27.2  RDW 53.6 - 64.4 % 14.8  Platelets 150 - 400 K/uL 70 (L)  (L): Data is abnormally low (H): Data is abnormally high  Latest Reference Range & Units 01/18/24 08:51  Neutrophils % 48  Lymphocytes % 26  Monocytes Relative % 16  Eosinophil % 8  Basophil % 1  Immature Granulocytes % 1    Latest Reference Range & Units 01/16/24 03:10  Sodium 135 - 145 mmol/L 138  Potassium 3.5 - 5.1 mmol/L 3.4 (L)  Chloride 98 - 111 mmol/L 103  CO2 22 - 32 mmol/L 25  Glucose 70 - 99 mg/dL 034 (H)  BUN 6 - 20 mg/dL 50 (H)  Creatinine 7.42 - 1.24 mg/dL 5.95 (H)  Calcium  8.9 - 10.3 mg/dL 8.6 (L)  Anion gap 5  - 15  10  Phosphorus 2.5 - 4.6 mg/dL 5.2 (H)  Albumin  3.5 - 5.0 g/dL 2.3 (L)  (L): Data is abnormally low (H): Data is abnormally high   Latest Reference Range & Units 01/18/24 08:52  M Protein SerPl Elph-Mcnc Not Observed g/dL 0.9 (H) (C)  (H): Data is abnormally high (C): Corrected   Latest Reference Range & Units 01/18/24 08:52  IgA 90 - 386 mg/dL 6,387 (H)  (H): Data is abnormally high   Latest Reference Range & Units 01/18/24 08:51  Kappa free light chain 3.3 - 19.4 mg/L 1,099.9 (H)  (H): Data is abnormally high   Latest Reference Range & Units 01/18/24 08:51 01/18/24 08:52  Iron 45 - 182 ug/dL  564  UIBC ug/dL  332  TIBC 951 - 884 ug/dL  166 (H)  Saturation Ratios 17.9 - 39.5 %  33  Ferritin 24 - 336 ng/mL  114  Vitamin B12 180 - 914 pg/mL 6,881 (H)   (H): Data is abnormally high   ASSESSMENT & PLAN:  Assessment/Plan:  A 53 y.o. 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  He also has MGUS.  His platelet count of 70 today is essentially unchanged versus what it has been over these past few years.  Based upon this, his thrombocytopenia will continue to be followed conservatively.  When evaluating his monoclonal parameters, his M spike has essentially held stable.  As mentioned previously, his bone marrow biopsy in May 2024 was not consistent with multiple myeloma being present.  The patient understands the major health issue impacting his life is his end-stage renal disease, for which compliance with his dialysis schedule will be imperative.  Otherwise, as he is stable from a hematologic standpoint, I will see him back in 6 months for repeat clinical assessment.  The patient understands all the plans discussed today and is in agreement with them.    Mylan Schwarz Felicia Horde, MD

## 2024-01-30 ENCOUNTER — Other Ambulatory Visit: Payer: Self-pay | Admitting: Internal Medicine

## 2024-01-30 DIAGNOSIS — N2581 Secondary hyperparathyroidism of renal origin: Secondary | ICD-10-CM | POA: Diagnosis not present

## 2024-01-30 DIAGNOSIS — Z992 Dependence on renal dialysis: Secondary | ICD-10-CM | POA: Diagnosis not present

## 2024-01-30 DIAGNOSIS — I4892 Unspecified atrial flutter: Secondary | ICD-10-CM

## 2024-01-30 DIAGNOSIS — N186 End stage renal disease: Secondary | ICD-10-CM | POA: Diagnosis not present

## 2024-02-01 ENCOUNTER — Ambulatory Visit: Payer: BC Managed Care – PPO | Attending: Nurse Practitioner | Admitting: Nurse Practitioner

## 2024-02-01 ENCOUNTER — Encounter: Payer: Self-pay | Admitting: Nurse Practitioner

## 2024-02-01 ENCOUNTER — Telehealth: Payer: Self-pay | Admitting: Oncology

## 2024-02-01 VITALS — BP 138/72 | HR 65 | Ht 71.0 in | Wt 381.2 lb

## 2024-02-01 DIAGNOSIS — I480 Paroxysmal atrial fibrillation: Secondary | ICD-10-CM | POA: Diagnosis not present

## 2024-02-01 DIAGNOSIS — I471 Supraventricular tachycardia, unspecified: Secondary | ICD-10-CM | POA: Diagnosis not present

## 2024-02-01 DIAGNOSIS — Z7901 Long term (current) use of anticoagulants: Secondary | ICD-10-CM

## 2024-02-01 DIAGNOSIS — N186 End stage renal disease: Secondary | ICD-10-CM

## 2024-02-01 DIAGNOSIS — E785 Hyperlipidemia, unspecified: Secondary | ICD-10-CM

## 2024-02-01 DIAGNOSIS — Z992 Dependence on renal dialysis: Secondary | ICD-10-CM

## 2024-02-01 DIAGNOSIS — I2510 Atherosclerotic heart disease of native coronary artery without angina pectoris: Secondary | ICD-10-CM

## 2024-02-01 NOTE — Telephone Encounter (Signed)
 3/3/25Spoke with patient and confirmed next appts in Aug.

## 2024-02-01 NOTE — Patient Instructions (Signed)
 Medication Instructions:   Your physician recommends that you continue on your current medications as directed. Please refer to the Current Medication list given to you today.   *If you need a refill on your cardiac medications before your next appointment, please call your pharmacy*   Lab Work:  None ordered.  If you have labs (blood work) drawn today and your tests are completely normal, you will receive your results only by: MyChart Message (if you have MyChart) OR A paper copy in the mail If you have any lab test that is abnormal or we need to change your treatment, we will call you to review the results.   Testing/Procedures:  None ordered.   Follow-Up: At [facility], you and your health needs are our priority.  As part of our continuing mission to provide you with exceptional heart care, we have created designated Provider Care Teams.  These Care Teams include your primary Cardiologist (physician) and Advanced Practice Providers (APPs -  Physician Assistants and Nurse Practitioners) who all work together to provide you with the care you need, when you need it.  We recommend signing up for the patient portal called "MyChart".  Sign up information is provided on this After Visit Summary.  MyChart is used to connect with patients for Virtual Visits (Telemedicine).  Patients are able to view lab/test results, encounter notes, upcoming appointments, etc.  Non-urgent messages can be sent to your provider as well.   To learn more about what you can do with MyChart, go to [email or web address].    Your next appointment:   3-4 month(s) Patient cannot do Tuesday and Thursday due to dialysis.   Provider:   Christell Constant, MD     Other Instructions  Your physician wants you to follow-up in: 3-4 months  You will receive a reminder letter in the mail two months in advance. If you don't receive a letter, please call our office to schedule the follow-up  appointment.    1st Floor: - Lobby - Registration  - Pharmacy  - Lab - Cafe  2nd Floor: - PV Lab - Diagnostic Testing (echo, CT, nuclear med)  3rd Floor: - Vacant  4th Floor: - TCTS (cardiothoracic surgery) - AFib Clinic - Structural Heart Clinic - Vascular Surgery  - Vascular Ultrasound  5th Floor: - [facility] (general and EP) - Clinical Pharmacy for coumadin, hypertension, lipid, weight-loss medications, and med management appointments    Valet parking services will be available as well.

## 2024-02-02 DIAGNOSIS — Z992 Dependence on renal dialysis: Secondary | ICD-10-CM | POA: Diagnosis not present

## 2024-02-02 DIAGNOSIS — N186 End stage renal disease: Secondary | ICD-10-CM | POA: Diagnosis not present

## 2024-02-02 DIAGNOSIS — N2581 Secondary hyperparathyroidism of renal origin: Secondary | ICD-10-CM | POA: Diagnosis not present

## 2024-02-04 DIAGNOSIS — Z992 Dependence on renal dialysis: Secondary | ICD-10-CM | POA: Diagnosis not present

## 2024-02-04 DIAGNOSIS — N2581 Secondary hyperparathyroidism of renal origin: Secondary | ICD-10-CM | POA: Diagnosis not present

## 2024-02-04 DIAGNOSIS — N186 End stage renal disease: Secondary | ICD-10-CM | POA: Diagnosis not present

## 2024-02-05 ENCOUNTER — Encounter: Payer: Self-pay | Admitting: Internal Medicine

## 2024-02-05 ENCOUNTER — Ambulatory Visit: Payer: BC Managed Care – PPO | Admitting: Internal Medicine

## 2024-02-05 VITALS — BP 115/71 | HR 56 | Temp 98.2°F | Ht 71.0 in | Wt 375.2 lb

## 2024-02-05 DIAGNOSIS — R21 Rash and other nonspecific skin eruption: Secondary | ICD-10-CM

## 2024-02-05 DIAGNOSIS — D699 Hemorrhagic condition, unspecified: Secondary | ICD-10-CM

## 2024-02-05 DIAGNOSIS — I4892 Unspecified atrial flutter: Secondary | ICD-10-CM

## 2024-02-05 DIAGNOSIS — L219 Seborrheic dermatitis, unspecified: Secondary | ICD-10-CM

## 2024-02-05 DIAGNOSIS — I2510 Atherosclerotic heart disease of native coronary artery without angina pectoris: Secondary | ICD-10-CM

## 2024-02-05 DIAGNOSIS — R7303 Prediabetes: Secondary | ICD-10-CM

## 2024-02-05 DIAGNOSIS — I77819 Aortic ectasia, unspecified site: Secondary | ICD-10-CM

## 2024-02-05 DIAGNOSIS — K279 Peptic ulcer, site unspecified, unspecified as acute or chronic, without hemorrhage or perforation: Secondary | ICD-10-CM

## 2024-02-05 DIAGNOSIS — E039 Hypothyroidism, unspecified: Secondary | ICD-10-CM

## 2024-02-05 DIAGNOSIS — I10 Essential (primary) hypertension: Secondary | ICD-10-CM

## 2024-02-05 DIAGNOSIS — R1013 Epigastric pain: Secondary | ICD-10-CM | POA: Diagnosis not present

## 2024-02-05 DIAGNOSIS — I872 Venous insufficiency (chronic) (peripheral): Secondary | ICD-10-CM

## 2024-02-05 DIAGNOSIS — M1A09X0 Idiopathic chronic gout, multiple sites, without tophus (tophi): Secondary | ICD-10-CM

## 2024-02-05 DIAGNOSIS — N186 End stage renal disease: Secondary | ICD-10-CM | POA: Diagnosis not present

## 2024-02-05 DIAGNOSIS — R161 Splenomegaly, not elsewhere classified: Secondary | ICD-10-CM

## 2024-02-05 DIAGNOSIS — Z79899 Other long term (current) drug therapy: Secondary | ICD-10-CM

## 2024-02-05 DIAGNOSIS — R001 Bradycardia, unspecified: Secondary | ICD-10-CM

## 2024-02-05 DIAGNOSIS — R112 Nausea with vomiting, unspecified: Secondary | ICD-10-CM

## 2024-02-05 DIAGNOSIS — Z5309 Procedure and treatment not carried out because of other contraindication: Secondary | ICD-10-CM

## 2024-02-05 DIAGNOSIS — Z8719 Personal history of other diseases of the digestive system: Secondary | ICD-10-CM

## 2024-02-05 DIAGNOSIS — M4316 Spondylolisthesis, lumbar region: Secondary | ICD-10-CM

## 2024-02-05 DIAGNOSIS — R42 Dizziness and giddiness: Secondary | ICD-10-CM

## 2024-02-05 DIAGNOSIS — K219 Gastro-esophageal reflux disease without esophagitis: Secondary | ICD-10-CM

## 2024-02-05 DIAGNOSIS — K7581 Nonalcoholic steatohepatitis (NASH): Secondary | ICD-10-CM

## 2024-02-05 DIAGNOSIS — Z992 Dependence on renal dialysis: Secondary | ICD-10-CM

## 2024-02-05 DIAGNOSIS — D472 Monoclonal gammopathy: Secondary | ICD-10-CM

## 2024-02-05 DIAGNOSIS — D696 Thrombocytopenia, unspecified: Secondary | ICD-10-CM

## 2024-02-05 DIAGNOSIS — I1A0 Resistant hypertension: Secondary | ICD-10-CM

## 2024-02-05 DIAGNOSIS — K7210 Chronic hepatic failure without coma: Secondary | ICD-10-CM

## 2024-02-05 DIAGNOSIS — G4733 Obstructive sleep apnea (adult) (pediatric): Secondary | ICD-10-CM

## 2024-02-05 DIAGNOSIS — R5381 Other malaise: Secondary | ICD-10-CM

## 2024-02-05 DIAGNOSIS — N184 Chronic kidney disease, stage 4 (severe): Secondary | ICD-10-CM

## 2024-02-05 DIAGNOSIS — Z951 Presence of aortocoronary bypass graft: Secondary | ICD-10-CM

## 2024-02-05 DIAGNOSIS — E782 Mixed hyperlipidemia: Secondary | ICD-10-CM

## 2024-02-05 MED ORDER — SUCRALFATE 1 G PO TABS: 1.0000 g | ORAL_TABLET | Freq: Every day | ORAL | 1 refills | Status: DC

## 2024-02-05 MED ORDER — TIRZEPATIDE 2.5 MG/0.5ML ~~LOC~~ SOAJ: 2.5000 mg | SUBCUTANEOUS | 3 refills | Status: DC

## 2024-02-05 NOTE — Patient Instructions (Addendum)
 AFTER VISIT SUMMARY  YOUR HEALTH CONDITIONS DISCUSSED TODAY: End Stage Renal Disease (ESRD) on dialysis Peptic Ulcer Disease/GERD Morbid Obesity Coronary Artery Disease Chronic Liver Disease (NASH) Thrombocytopenia (low platelets) Obstructive Sleep Apnea Hypertension MEDICATION CHANGES: ADDED: Carafate (sucralfate) 1g tablet - Take 1 tablet at bedtime ADDED: Mounjaro (tirzepatide) 2.5mg  - Inject under the skin once weekly IMPORTANT INSTRUCTIONS: For Your Kidney Disease: Continue dialysis three times weekly Limit fluid intake to 32 ounces (4 cups) daily Weigh yourself daily and record your weight Maintain your renal diet as advised by your dietitian Take all prescribed medications, especially phosphate binders with meals For Your Stomach/Ulcer: Take Carafate at bedtime to help heal your ulcer Continue Protonix twice daily before meals Avoid spicy foods, alcohol, and caffeine Call if you experience severe stomach pain, vomiting blood, or black/tarry stools For Your New Mounjaro Medication: Inject once weekly as instructed Store in refrigerator until ready to use Start with lowest dose (2.5mg ) May cause nausea, diarrhea, or decreased appetite Call if you experience severe side effects or dehydration This medication will help with both weight loss and liver health For Your Heart Health: Continue all heart medications Keep nitroglycerin available for chest pain Call 911 if you experience severe chest pain that doesn't resolve with nitroglycerin For Your Sleep Apnea: Use CPAP every night when sleeping Weight loss should help improve your sleep apnea symptoms Clean your CPAP equipment regularly For Your Leg Swelling/Skin Issues: Elevate legs when sitting Apply prescribed creams as directed Use compression stockings if recommended Report any skin breakdown or wounds promptly DIET AND LIFESTYLE: Continue with renal diet guidelines Low sodium (salt) diet Avoid processed  foods Limit fluids to 32 ounces daily Gentle physical activity as tolerated, like short walks Consider meeting with a dietitian for personalized advice FOLLOW-UP APPOINTMENTS: Return to clinic in 4 weeks Continue regular nephrology appointments Continue regular dialysis schedule DISABILITY APPLICATION: We support your application for disability benefits Contact Social Security at [phone] to begin application Our office will provide necessary medical documentation You may qualify for Medicare based on your ESRD diagnosis WHEN TO SEEK MEDICAL ATTENTION: Call 911 or go to the Emergency Room if you experience: Severe chest pain not relieved by nitroglycerin Shortness of breath Vomiting blood or passing black stools Severe headache with high blood pressure Confusion or difficulty speaking Call the clinic if you experience: Increased nausea or vomiting Difficulty tolerating Mounjaro Signs of infection at dialysis access site Significant weight gain (more than 2-3 pounds in one day) Increased swelling in your legs or feet New or worsening symptoms OUR CONTACT INFORMATION: Dr. Glenetta Hew   [facility]   ? [phone]  ?? View Profile  ?? Schedule Sales promotion account executive at [facility] [address] [place], [place] [address] [phone]

## 2024-02-05 NOTE — Progress Notes (Addendum)
 ==============================  [facility]: [phone]   -- Medical Office Visit --  Patient: Sean [name]      Age: 53 y.o.       Sex:  male  Date:   02/05/2024 Today's Healthcare Provider: Bernardino KANDICE Cone, MD  ==============================   CHIEF COMPLAINT: Hospitalization Follow-up Now on dialysis with active peptic ulcer disease wanting to resume Zepbound /Mounjaro .  SUBJECTIVE: Background This is a 53 y.o. male who has History of umbilical hernia repair; Hypertension; Hyperlipidemia; Thrombocytopenia (HCC); Sinus bradycardia; CAD (coronary artery disease); Atrial flutter with rapid ventricular response (HCC); Morbid obesity (HCC); Chronic kidney disease, stage 4 (severe) (HCC); Other long term (current) drug therapy; History of coronary artery bypass surgery; Obstructive sleep apnea syndrome; Chronic gout of multiple sites; Alkaline phosphatase elevation; Gout; Ectatic aorta (HCC); Gastroesophageal reflux disease without esophagitis; Herpes simplex; Metabolic dysfunction-associated steatohepatitis (MASH); Prediabetes; Statins contraindicated; Hypothyroid; Chronic venous stasis dermatitis of both lower extremities; Nausea and vomiting; Chronic liver failure without hepatic coma (HCC); Splenomegaly; Spondylolisthesis; Bleeding diathesis (HCC); Malaise and fatigue; Allergic rhinitis due to pollen; Amitriptyline  adverse reaction; MGUS (monoclonal gammopathy of unknown significance); 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 History of Present Illness This 53 year old male with end-stage renal disease presents for follow-up after recent hospitalization. Patient is  currently on hemodialysis three times weekly and reports overall stability since discharge, with some persistent gastrointestinal symptoms. Patient experiences intermittent nausea and vomiting, which he attributes to both his renal disease and peptic ulcer disease. He specifically notes vomiting on Tuesday night after consuming spicy pork chops, which he believes aggravated his underlying ulcer. He has been taking Carafate  as prescribed to manage these symptoms, along with Protonix . Patient reports muscle cramps, particularly in his calves and feet, which he attributes to electrolyte imbalances related to dialysis. He is adherent to fluid restriction of 32 ounces daily as instructed by his nephrologist and monitors his weight daily to track fluid status. Despite having end-stage renal disease, patient notes frequent urination, which initially puzzled him. He continues to experience fatigue and limitations in daily activities related to his dialysis schedule, which has impacted his ability to work. 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 Patient was previously on Mounjaro  for weight management, which was discontinued due to concerns about dehydration. He expresses interest in restarting this medication to address his morbid obesity but is cautious given his current ulcer symptoms and dialysis requirements. Medication regimen is extensive and complex, with many medications taken as needed. Notable regular medications include Protonix , Cardizem , Eliquis , and Repatha  injections every two weeks. He is also taking calcium  acetate, amitriptyline , and multiple other medications for symptomatic management of his numerous comorbidities. During this visit, patient is accompanied by mother and appears in no acute distress despite his multiple chronic conditions.  Visually reviewed that  patient  has a past medical history of Abnormal liver enzymes, Cardiac cirrhosis, Chronic combined systolic and diastolic CHF (congestive heart failure) (HCC), CKD (chronic kidney disease) stage 3, GFR 30-59 ml/min (HCC) (01/04/2016), CKD (chronic kidney disease), stage II, Congenital heart defect, Coronary artery disease, Gastric polyps (11/25/2022), Gastritis and gastroduodenitis (11/25/2022), Gastroesophageal reflux disease with esophagitis  and hemorrhage (04/04/2021), Hematuria, History of umbilical hernia repair (07/10/2011), Hypercholesteremia, Hypertension, Incisional hernia (07/10/2011), Morbid obesity (HCC), Morbid obesity with BMI of 50.0-59.9, adult (HCC) (01/04/2016), Nausea and vomiting (11/25/2022), OSA on CPAP, Paroxysmal atrial flutter (HCC) (02/12/2016), 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 Manually updated: No problems updated. Verbally reviewed (per [employer]-generated extraction):  Past Medical History - Constipation - Heartburn - Vomiting - Cramps in the top of the foot - Kidney failure Medications - Tylenol  500 mg as needed - Xylocaine  nasal spray as needed - Diclofenac  sodium, Voltaren  gel 1% as needed - Colace (docusate sodium ) twice a day - Miralax  as needed - Repatha  injection every 2 weeks - Pepcid  (cimetidine) for stomach 2 times a day - Flonase  nasal spray as needed - Ketoconazole  cream as needed - Methocarbamol  (muscle relaxer) as needed - Nitroglycerin  as needed for chest pain - Ondansetron  as needed for nausea or vomiting - Triamcinolone  nasal inhaler (Nasacort ) as needed - Protonix  - Cardizem  - Eliquis  - B12 - Singulair  - Carafate   Visually reviewed and manually updated: Current Outpatient Medications on File Prior to Visit  Medication Sig   acetaminophen  (TYLENOL ) 500 MG tablet Take 1,500 mg by mouth once as needed for moderate pain (pain score 4-6), fever, headache or mild pain (pain score 1-3).   allopurinol  (ZYLOPRIM ) 100  MG tablet Take 1 tablet (100 mg total) by mouth daily.   amitriptyline  (ELAVIL ) 25 MG tablet Take 1 tablet (25 mg total) by mouth at bedtime. (Patient taking differently: Take 25 mg by mouth at bedtime.)   azelastine  (ASTELIN ) 0.1 % nasal spray Place 2 sprays into both nostrils 2 (two) times daily. Use in each nostril as directed (Patient taking differently: Place 2 sprays into both nostrils as needed for allergies. Use in each nostril as directed)   B Complex-C-Folic Acid  (DIALYVITE  TABLET) TABS Take by mouth.   calcium  acetate (PHOSLO ) 667 MG capsule Take 2 capsules (1,334 mg total) by mouth 3 (three) times daily with meals.   Cholecalciferol (VITAMIN D3) 125 MCG (5000 UT) CAPS Take 1 capsule (5,000 Units total) by mouth daily at 12 noon.   Cyanocobalamin  (VITAMIN B-12 PO) Place 1 tablet under the tongue daily.   diclofenac  Sodium (VOLTAREN ) 1 % GEL Apply 2 g topically 4 (four) times daily for 7 days, THEN 2 g 4 (four) times daily as needed.   diltiazem  (CARDIZEM  SR) 120 MG 12 hr capsule TAKE 1 CAPSULE(120 MG) BY MOUTH TWICE DAILY   docusate sodium  (COLACE) 100 MG capsule Take 2 capsules (200 mg total) by mouth 2 (two) times daily. (Patient taking differently: Take 200 mg by mouth as needed for moderate constipation.)   ELIQUIS  5 MG TABS tablet TAKE 1 TABLET(5 MG) BY MOUTH TWICE DAILY   Evolocumab  (REPATHA  SURECLICK) 140 MG/ML SOAJ INJECT THE CONTENTS OF 1 SYRINGE UNDER THE SKIN EVERY 14 DAYS   famotidine  (PEPCID ) 20 MG tablet TAKE 1 TABLET(20 MG) BY MOUTH TWICE DAILY   famotidine -calcium  carbonate-magnesium  hydroxide (PEPCID  COMPLETE) [phone] MG chewable tablet Chew 1 tablet by mouth 2 (two) times daily as needed.   fluticasone  (FLONASE ) 50 MCG/ACT nasal spray Place 2 sprays into both nostrils daily.   ketoconazole  (NIZORAL ) 2 % cream Apply 1 Application topically 2 (two) times daily.   methocarbamol  (ROBAXIN ) 500 MG tablet Take 1 tablet (500 mg total) by mouth every 8 (eight) hours as needed  for muscle spasms (shoulder pain).   metoprolol  tartrate (LOPRESSOR ) 25 MG tablet Take 1 tablet (25  mg total) by mouth 2 (two) times daily.   montelukast  (SINGULAIR ) 10 MG tablet TAKE 1 TABLET(10 MG) BY MOUTH DAILY (Patient taking differently: Take 10 mg by mouth daily.)   nitroGLYCERIN  (NITROSTAT ) 0.4 MG SL tablet Place 1 tablet (0.4 mg total) under the tongue every 5 (five) minutes as needed for chest pain.   ondansetron  (ZOFRAN -ODT) 4 MG disintegrating tablet Take 1 tablet (4 mg total) by mouth every 8 (eight) hours as needed for nausea or vomiting.   pantoprazole  (PROTONIX ) 40 MG tablet Take 1 tablet (40 mg total) by mouth 2 (two) times daily before a meal.   polyethylene glycol (MIRALAX  / GLYCOLAX ) 17 g packet Mix 17 grams (1 packet) in 8 ounces of fluid and take by mouth daily. (Patient taking differently: Take 17 g by mouth daily as needed for moderate constipation.)   triamcinolone  (NASACORT ) 55 MCG/ACT AERO nasal inhaler Place 1 spray into the nose daily. Start with 1 spray each side twice a day for 3 days, then reduce to daily.   Current Facility-Administered Medications on File Prior to Visit  Medication   technetium tetrofosmin  (TC-MYOVIEW ) injection 29.8 millicurie   Medications Discontinued During This Encounter  Medication Reason   sucralfate  (CARAFATE ) 1 g tablet Reorder      Objective      02/05/2024    8:03 AM 02/01/2024    2:37 PM 01/29/2024    4:54 PM  Vitals with BMI  Height 5' 11 5' 11 5' 11  Weight 375 lbs 3 oz 381 lbs 3 oz 381 lbs 5 oz  BMI 52.35 53.19 53.2  Systolic 115 138 855  Diastolic 71 72 81  Pulse 56 65 64   Wt Readings from Last 10 Encounters:  02/05/24 (!) 375 lb 3.2 oz (170.2 kg)  02/01/24 (!) 381 lb 3.2 oz (172.9 kg)  01/29/24 (!) 381 lb 4.8 oz (173 kg)  01/16/24 (!) 382 lb 0.9 oz (173.3 kg)  01/05/24 (!) 400 lb (181.4 kg)  01/01/24 (!) 400 lb 9.6 oz (181.7 kg)  12/14/23 (!) 387 lb 9.6 oz (175.8 kg)  12/01/23 (!) 391 lb (177.4 kg)  12/01/23  (!) 391 lb 6.4 oz (177.5 kg)  11/09/23 (!) 394 lb 6.4 oz (178.9 kg)   Vital signs reviewed.  Nursing notes reviewed. Weight trend reviewed. Physical Exam  Physical Exam General Appearance:  truncal adiposity No acute distress appreciable.   Well-groomed, healthy-appearing male.  Truncal adiposity .  Good muscle tone. Pulmonary:  Normal work of breathing at rest, no respiratory distress apparent.    Musculoskeletal: All extremities are intact.  Neurological:  Awake, alert, oriented, and engaged.  No obvious focal neurological deficits or cognitive impairments.  Sensorium seems unclouded.   Speech is clear and coherent with logical content. Psychiatric:  Appropriate mood, pleasant and cooperative demeanor, thoughtful and engaged during the exam    No results found for any visits on 02/05/24. Appointment on 01/18/2024  Component Date Value   WBC Count 01/18/2024 7.4    RBC 01/18/2024 3.33 (L)    Hemoglobin 01/18/2024 11.4 (L)    HCT 01/18/2024 33.4 (L)    MCV 01/18/2024 100.3 (H)    MCH 01/18/2024 34.2 (H)    MCHC 01/18/2024 34.1    RDW 01/18/2024 14.8    Platelet Count 01/18/2024 70 (L)    nRBC 01/18/2024 0.00    Neutrophils Relative % 01/18/2024 48    Neutro Abs 01/18/2024 3.6    Lymphocytes Relative 01/18/2024 26    Lymphs Abs  01/18/2024 1.9    Monocytes Relative 01/18/2024 16    Monocytes Absolute 01/18/2024 1.2 (H)    Eosinophils Relative 01/18/2024 8    Eosinophils Absolute 01/18/2024 0.6 (H)    Basophils Relative 01/18/2024 1    Basophils Absolute 01/18/2024 0.1    Immature Granulocytes 01/18/2024 1    Abs Immature Granulocytes 01/18/2024 0.07    nRBC 01/18/2024 0    Immature Platelet Fracti* 01/18/2024 0.3 (L)    Ferritin 01/18/2024 114    Iron 01/18/2024 152    TIBC 01/18/2024 456 (H)    Saturation Ratios 01/18/2024 33    UIBC 01/18/2024 304    Vitamin B-12 01/18/2024 6,881 (H)    IgG (Immunoglobin G), Se* 01/18/2024 561 (L)    IgA 01/18/2024 1,203 (H)    IgM  (Immunoglobulin M), * 01/18/2024 91    Total Protein ELP 01/18/2024 5.9 (L)    Albumin  SerPl Elph-Mcnc 01/18/2024 2.8 (L)    Alpha 1 01/18/2024 0.3    Alpha2 Glob SerPl Elph-M* 01/18/2024 0.6    B-Globulin SerPl Elph-Mc* 01/18/2024 1.7 (H)    Gamma Glob SerPl Elph-Mc* 01/18/2024 0.4    M Protein SerPl Elph-Mcnc 01/18/2024 0.9 (H)    Globulin, Total 01/18/2024 3.1    Albumin /Glob SerPl 01/18/2024 1.0    IFE 1 01/18/2024 Comment (A)    Please Note 01/18/2024 Comment    Kappa free light chain 01/18/2024 1,099.9 (H)    Lambda free light chains 01/18/2024 33.2 (H)    Kappa, lambda light chai* 01/18/2024 33.13 (H)   No results displayed because visit has over 200 results.    Admission on 01/05/2024, Discharged on 01/05/2024  Component Date Value   WBC 01/05/2024 6.5    RBC 01/05/2024 3.17 (L)    Hemoglobin 01/05/2024 10.8 (L)    HCT 01/05/2024 32.8 (L)    MCV 01/05/2024 103.5 (H)    MCH 01/05/2024 34.1 (H)    MCHC 01/05/2024 32.9    RDW 01/05/2024 14.2    Platelets 01/05/2024 66 (L)    nRBC 01/05/2024 0.0    Neutrophils Relative % 01/05/2024 54    Neutro Abs 01/05/2024 3.6    Lymphocytes Relative 01/05/2024 22    Lymphs Abs 01/05/2024 1.4    Monocytes Relative 01/05/2024 12    Monocytes Absolute 01/05/2024 0.8    Eosinophils Relative 01/05/2024 10    Eosinophils Absolute 01/05/2024 0.6 (H)    Basophils Relative 01/05/2024 1    Basophils Absolute 01/05/2024 0.1    Immature Granulocytes 01/05/2024 1    Abs Immature Granulocytes 01/05/2024 0.05    Sodium 01/05/2024 142    Potassium 01/05/2024 3.9    Chloride 01/05/2024 107    CO2 01/05/2024 24    Glucose, Bld 01/05/2024 151 (H)    BUN 01/05/2024 57 (H)    Creatinine, Ser 01/05/2024 6.95 (H)    Calcium  01/05/2024 8.9    Total Protein 01/05/2024 5.9 (L)    Albumin  01/05/2024 2.4 (L)    AST 01/05/2024 27    ALT 01/05/2024 26    Alkaline Phosphatase 01/05/2024 140 (H)    Total Bilirubin 01/05/2024 0.7    GFR, Estimated  01/05/2024 9 (L)    Anion gap 01/05/2024 11    Magnesium  01/05/2024 2.4    B Natriuretic Peptide 01/05/2024 430.6 (H)    SARS Coronavirus 2 by RT* 01/05/2024 NEGATIVE    Influenza A by PCR 01/05/2024 NEGATIVE    Influenza B by PCR 01/05/2024 NEGATIVE    Resp Syncytial Virus  by * 01/05/2024 NEGATIVE   Scanned Document on 12/28/2023  Component Date Value   PTH 12/23/2023 177    Calcium  12/23/2023 9.1    EGFR 12/23/2023 10.0   Office Visit on 12/14/2023  Component Date Value   WBC 12/14/2023 6.3    RBC 12/14/2023 3.69 (L)    Hemoglobin 12/14/2023 12.6 (L)    HCT 12/14/2023 37.0 (L)    MCV 12/14/2023 100.3 (H)    MCHC 12/14/2023 34.0    RDW 12/14/2023 14.3    Platelets 12/14/2023 70.0 (L)    Neutrophils Relative % 12/14/2023 67.3    Lymphocytes Relative 12/14/2023 16.1    Monocytes Relative 12/14/2023 11.9    Eosinophils Relative 12/14/2023 3.9    Basophils Relative 12/14/2023 0.8    Neutro Abs 12/14/2023 4.2    Lymphs Abs 12/14/2023 1.0    Monocytes Absolute 12/14/2023 0.8    Eosinophils Absolute 12/14/2023 0.2    Basophils Absolute 12/14/2023 0.0    Sodium 12/14/2023 143    Potassium 12/14/2023 3.5    Chloride 12/14/2023 107    CO2 12/14/2023 26    Glucose, Bld 12/14/2023 110 (H)    BUN 12/14/2023 45 (H)    Creatinine, Ser 12/14/2023 5.98 (HH)    Total Bilirubin 12/14/2023 0.9    Alkaline Phosphatase 12/14/2023 171 (H)    AST 12/14/2023 37    ALT 12/14/2023 39    Total Protein 12/14/2023 6.6    Albumin  12/14/2023 3.3 (L)    GFR 12/14/2023 10.23 (LL)    Calcium  12/14/2023 9.3    Color, Urine 12/14/2023 YELLOW    APPearance 12/14/2023 CLEAR    Specific Gravity, Urine 12/14/2023 1.020    pH 12/14/2023 6.5    Total Protein, Urine 12/14/2023 >=300 (A)    Urine Glucose 12/14/2023 NEGATIVE    Ketones, ur 12/14/2023 NEGATIVE    Bilirubin Urine 12/14/2023 NEGATIVE    Hgb urine dipstick 12/14/2023 SMALL (A)    Urobilinogen, UA 12/14/2023 0.2    Leukocytes,Ua 12/14/2023  NEGATIVE    Nitrite 12/14/2023 NEGATIVE    WBC, UA 12/14/2023 0-2/hpf    RBC / HPF 12/14/2023 3-6/hpf (A)    Squamous Epithelial / HPF 12/14/2023 Rare(0-4/hpf)   Office Visit on 12/01/2023  Component Date Value   WBC 12/01/2023 5.8    RBC 12/01/2023 3.63 (L)    Hemoglobin 12/01/2023 12.6 (L)    HCT 12/01/2023 36.1 (L)    MCV 12/01/2023 99.6    MCHC 12/01/2023 34.8    RDW 12/01/2023 14.9    Platelets 12/01/2023 67.0 (L)    Neutrophils Relative % 12/01/2023 56.2    Lymphocytes Relative 12/01/2023 23.7    Monocytes Relative 12/01/2023 13.0 (H)    Eosinophils Relative 12/01/2023 6.2 (H)    Basophils Relative 12/01/2023 0.9    Neutro Abs 12/01/2023 3.2    Lymphs Abs 12/01/2023 1.4    Monocytes Absolute 12/01/2023 0.8    Eosinophils Absolute 12/01/2023 0.4    Basophils Absolute 12/01/2023 0.0    Sodium 12/01/2023 140    Potassium 12/01/2023 3.5    Chloride 12/01/2023 104    CO2 12/01/2023 25    Glucose, Bld 12/01/2023 96    BUN 12/01/2023 50 (H)    Creatinine, Ser 12/01/2023 5.78 (HH)    Total Bilirubin 12/01/2023 0.8    Alkaline Phosphatase 12/01/2023 178 (H)    AST 12/01/2023 34    ALT 12/01/2023 44    Total Protein 12/01/2023 6.7    Albumin  12/01/2023 3.5    GFR  12/01/2023 10.66 (LL)    Calcium  12/01/2023 9.3    TSH W/REFLEX TO FT4 12/01/2023 3.83    Color, Urine 12/01/2023 YELLOW    APPearance 12/01/2023 CLEAR    Specific Gravity, Urine 12/01/2023 1.015    pH 12/01/2023 6.0    Total Protein, Urine 12/01/2023 100 (A)    Urine Glucose 12/01/2023 250 (A)    Ketones, ur 12/01/2023 NEGATIVE    Bilirubin Urine 12/01/2023 NEGATIVE    Hgb urine dipstick 12/01/2023 SMALL (A)    Urobilinogen, UA 12/01/2023 0.2    Leukocytes,Ua 12/01/2023 NEGATIVE    Nitrite 12/01/2023 NEGATIVE    WBC, UA 12/01/2023 0-2/hpf    RBC / HPF 12/01/2023 0-2/hpf    Squamous Epithelial / HPF 12/01/2023 Rare(0-4/hpf)    POC Glucose 12/01/2023 116 (A)   Office Visit on 11/09/2023  Component Date  Value   WBC 11/09/2023 4.8    RBC 11/09/2023 3.42 (L)    Hemoglobin 11/09/2023 11.7 (L)    HCT 11/09/2023 34.3 (L)    MCV 11/09/2023 100.1 (H)    MCHC 11/09/2023 34.2    RDW 11/09/2023 15.3    Platelets 11/09/2023 66.0 (L)    Neutrophils Relative % 11/09/2023 56.8    Lymphocytes Relative 11/09/2023 25.4    Monocytes Relative 11/09/2023 11.2    Eosinophils Relative 11/09/2023 5.5 (H)    Basophils Relative 11/09/2023 1.1    Neutro Abs 11/09/2023 2.7    Lymphs Abs 11/09/2023 1.2    Monocytes Absolute 11/09/2023 0.5    Eosinophils Absolute 11/09/2023 0.3    Basophils Absolute 11/09/2023 0.1    Sodium 11/09/2023 141    Potassium 11/09/2023 3.9    Chloride 11/09/2023 107    CO2 11/09/2023 23    Glucose, Bld 11/09/2023 159 (H)    BUN 11/09/2023 66 (H)    Creatinine, Ser 11/09/2023 5.04 (HH)    Total Bilirubin 11/09/2023 0.7    Alkaline Phosphatase 11/09/2023 199 (H)    AST 11/09/2023 35    ALT 11/09/2023 45    Total Protein 11/09/2023 6.8    Albumin  11/09/2023 3.3 (L)    GFR 11/09/2023 12.57 (LL)    Calcium  11/09/2023 8.9    Creatinine, Urine 11/09/2023 42    Protein/Creat Ratio 11/09/2023 2,762 (H)    Protein/Creatinine Ratio 11/09/2023 2.762 (H)    Total Protein, Urine 11/09/2023 116 (H)    Color, Urine 11/09/2023 YELLOW    APPearance 11/09/2023 CLEAR    Specific Gravity, Urine 11/09/2023 1.015    pH 11/09/2023 6.0    Total Protein, Urine 11/09/2023 100 (A)    Urine Glucose 11/09/2023 >=1000 (A)    Ketones, ur 11/09/2023 NEGATIVE    Bilirubin Urine 11/09/2023 NEGATIVE    Hgb urine dipstick 11/09/2023 SMALL (A)    Urobilinogen, UA 11/09/2023 0.2    Leukocytes,Ua 11/09/2023 NEGATIVE    Nitrite 11/09/2023 NEGATIVE    WBC, UA 11/09/2023 0-2/hpf    RBC / HPF 11/09/2023 0-2/hpf    Mucus, UA 11/09/2023 Presence of (A)    Squamous Epithelial / HPF 11/09/2023 Rare(0-4/hpf)    Amorphous 11/09/2023 Present (A)   Admission on 11/02/2023, Discharged on 11/04/2023  Component Date  Value   Sodium 11/02/2023 140    Potassium 11/02/2023 3.9    Chloride 11/02/2023 106    CO2 11/02/2023 25    Glucose, Bld 11/02/2023 170 (H)    BUN 11/02/2023 70 (H)    Creatinine, Ser 11/02/2023 5.63 (H)    Calcium  11/02/2023 9.1    Total Protein  11/02/2023 7.1    Albumin  11/02/2023 2.8 (L)    AST 11/02/2023 57 (H)    ALT 11/02/2023 71 (H)    Alkaline Phosphatase 11/02/2023 188 (H)    Total Bilirubin 11/02/2023 0.7    GFR, Estimated 11/02/2023 12 (L)    Anion gap 11/02/2023 9    WBC 11/02/2023 5.3    RBC 11/02/2023 3.49 (L)    Hemoglobin 11/02/2023 11.3 (L)    HCT 11/02/2023 35.8 (L)    MCV 11/02/2023 102.6 (H)    MCH 11/02/2023 32.4    MCHC 11/02/2023 31.6    RDW 11/02/2023 14.6    Platelets 11/02/2023 54 (L)    nRBC 11/02/2023 0.0    ABO/RH(D) 11/02/2023 A POS    Antibody Screen 11/02/2023 NEG    Sample Expiration 11/02/2023                     Value:11/05/2023,2359 Performed at [facility], [address], [place], [place] [address]    Fecal Occult Bld 11/02/2023 NEGATIVE    Troponin I (High Sensiti* 11/02/2023 3    B Natriuretic Peptide 11/02/2023 25.4    Hemoglobin 11/02/2023 11.6 (L)    HCT 11/02/2023 35.9 (L)    Glucose-Capillary 11/02/2023 123 (H)    WBC 11/03/2023 5.1    RBC 11/03/2023 3.37 (L)    Hemoglobin 11/03/2023 11.3 (L)    HCT 11/03/2023 35.3 (L)    MCV 11/03/2023 104.7 (H)    MCH 11/03/2023 33.5    MCHC 11/03/2023 32.0    RDW 11/03/2023 14.8    Platelets 11/03/2023 56 (L)    nRBC 11/03/2023 0.0    Sodium 11/03/2023 143    Potassium 11/03/2023 3.7    Chloride 11/03/2023 111    CO2 11/03/2023 23    Glucose, Bld 11/03/2023 140 (H)    BUN 11/03/2023 65 (H)    Creatinine, Ser 11/03/2023 5.44 (H)    Calcium  11/03/2023 8.7 (L)    Total Protein 11/03/2023 6.3 (L)    Albumin  11/03/2023 2.6 (L)    AST 11/03/2023 53 (H)    ALT 11/03/2023 71 (H)    Alkaline Phosphatase 11/03/2023 185 (H)    Total Bilirubin 11/03/2023 0.6    GFR, Estimated  11/03/2023 12 (L)    Anion gap 11/03/2023 9    Magnesium  11/03/2023 2.3    Phosphorus 11/03/2023 4.9 (H)    Glucose-Capillary 11/03/2023 111 (H)    Glucose-Capillary 11/03/2023 98    Color, Urine 11/03/2023 YELLOW    APPearance 11/03/2023 CLEAR    Specific Gravity, Urine 11/03/2023 1.011    pH 11/03/2023 6.0    Glucose, UA 11/03/2023 >=500 (A)    Hgb urine dipstick 11/03/2023 NEGATIVE    Bilirubin Urine 11/03/2023 NEGATIVE    Ketones, ur 11/03/2023 NEGATIVE    Protein, ur 11/03/2023 100 (A)    Nitrite 11/03/2023 NEGATIVE    Leukocytes,Ua 11/03/2023 NEGATIVE    RBC / HPF 11/03/2023 0-5    WBC, UA 11/03/2023 0-5    Bacteria, UA 11/03/2023 NONE SEEN    Squamous Epithelial / HPF 11/03/2023 0-5    Glucose-Capillary 11/03/2023 105 (H)    WBC 11/04/2023 4.9    RBC 11/04/2023 3.15 (L)    Hemoglobin 11/04/2023 10.2 (L)    HCT 11/04/2023 31.6 (L)    MCV 11/04/2023 100.3 (H)    MCH 11/04/2023 32.4    MCHC 11/04/2023 32.3    RDW 11/04/2023 14.8    Platelets 11/04/2023 54 (L)  nRBC 11/04/2023 0.0    Magnesium  11/04/2023 2.2    Sodium 11/04/2023 141    Potassium 11/04/2023 4.0    Chloride 11/04/2023 111    CO2 11/04/2023 22    Glucose, Bld 11/04/2023 132 (H)    BUN 11/04/2023 62 (H)    Creatinine, Ser 11/04/2023 5.25 (H)    Calcium  11/04/2023 8.3 (L)    Phosphorus 11/04/2023 4.8 (H)    Albumin  11/04/2023 2.5 (L)    GFR, Estimated 11/04/2023 13 (L)    Anion gap 11/04/2023 8    Glucose-Capillary 11/03/2023 167 (H)    Glucose-Capillary 11/04/2023 153 (H)   Scanned Document on 11/02/2023  Component Date Value   EGFR 10/23/2023 16.0   Abstract on 07/27/2023  Component Date Value   Hemoglobin 07/27/2023 11.6 (A)    HCT 07/27/2023 34 (A)    Neutrophils Absolute 07/27/2023 2.41    Platelets 07/27/2023 53 (A)    WBC 07/27/2023 4.3    RBC 07/27/2023 3.44 (A)    Glucose 07/27/2023 101    BUN 07/27/2023 45 (A)    CO2 07/27/2023 25 (A)    Creatinine 07/27/2023 3.2 (A)     Potassium 07/27/2023 4.4    Sodium 07/27/2023 140    Chloride 07/27/2023 109 (A)    Calcium  07/27/2023 9.2    Albumin  07/27/2023 3.7    Alkaline Phosphatase 07/27/2023 192 (A)    ALT 07/27/2023 50 (A)    AST 07/27/2023 48 (A)    Bilirubin, Total 07/27/2023 1.4   There may be more visits with results that are not included.  No image results found. DG Shoulder Right Result Date: 01/14/2024 CLINICAL DATA:  Shoulder pain EXAM: RIGHT SHOULDER - 2+ VIEW COMPARISON:  None Available. FINDINGS: There is no evidence of fracture or dislocation. There is no evidence of arthropathy or other focal bone abnormality. Soft tissues are unremarkable. IMPRESSION: Negative. Electronically Signed   By: Luke Bun M.D.   On: 01/14/2024 00:10   DG CHEST PORT 1 VIEW Result Date: 01/11/2024 CLINICAL DATA:  Vascular dialysis catheter placement. EXAM: PORTABLE CHEST 1 VIEW COMPARISON:  01/08/2024 FINDINGS: Catheter placed from a right internal jugular approach. Tip is either at the SVC RA junction or at the proximal right atrium. No pneumothorax. IMPRESSION: Catheter placed from a right internal jugular approach. Tip is either at the SVC RA junction or at the proximal right atrium. No pneumothorax. Electronically Signed   By: Oneil Officer M.D.   On: 01/11/2024 17:33   DG C-Arm 1-60 Min Result Date: 01/11/2024 CLINICAL DATA:  Tunneled dialysis catheter insertion EXAM: DG C-ARM 1-60 MIN COMPARISON:  None Available. FINDINGS: 2 fluoroscopic images are obtained during performance of the procedure and are provided for interpretation only. Right-sided dialysis catheter is identified via internal jugular approach, tip overlying atriocaval junction. Please refer to operative report. Fluoroscopy time: 4 minutes 17 seconds, 59.34 mGy IMPRESSION: 1. Intraoperative exam as above. Electronically Signed   By: Ozell Daring M.D.   On: 01/11/2024 15:31   VAS US  UPPER EXT VEIN MAPPING (PRE-OP  AVF) Result Date: 01/10/2024 UPPER EXTREMITY  VEIN MAPPING Patient Name:  Sean [name]  Date of Exam:   01/09/2024 Medical Rec #: [id number]            Accession #:    [id number] Date of Birth: 1971/02/15            Patient Gender: M Patient Age:   45 years Exam Location:  [facility] Procedure:  VAS US  UPPER EXT VEIN MAPPING (PRE-OP  AVF) Referring Phys: JAY PATEL --------------------------------------------------------------------------------  Indications: Pre-access. History: CKD /ESRD.  Comparison Study: No prior study on file Performing Technologist: Alberta Lis RVS  Examination Guidelines: A complete evaluation includes B-mode imaging, spectral Doppler, color Doppler, and power Doppler as needed of all accessible portions of each vessel. Bilateral testing is considered an integral part of a complete examination. Limited examinations for reoccurring indications may be performed as noted. +-----------------+-------------+----------+---------+ Right Cephalic   Diameter (cm)Depth (cm)Findings  +-----------------+-------------+----------+---------+ Prox upper arm       0.47        0.63             +-----------------+-------------+----------+---------+ Mid upper arm        0.47        0.41             +-----------------+-------------+----------+---------+ Dist upper arm       0.51        0.43             +-----------------+-------------+----------+---------+ Antecubital fossa    0.60        0.31             +-----------------+-------------+----------+---------+ Prox forearm       0.39/0.35  0.33/0.78 branching +-----------------+-------------+----------+---------+ Mid forearm          0.46        0.46             +-----------------+-------------+----------+---------+ Dist forearm         0.38        0.39             +-----------------+-------------+----------+---------+ Wrist                0.30        0.53             +-----------------+-------------+----------+---------+  +-----------------+-------------+----------+---------+ Right Basilic    Diameter (cm)Depth (cm)Findings  +-----------------+-------------+----------+---------+ Prox upper arm       0.65        2.11    origin   +-----------------+-------------+----------+---------+ Mid upper arm        0.52        1.18             +-----------------+-------------+----------+---------+ Dist upper arm       0.40        0.71             +-----------------+-------------+----------+---------+ Antecubital fossa    0.32        0.36             +-----------------+-------------+----------+---------+ Prox forearm         0.34        0.29             +-----------------+-------------+----------+---------+ Mid forearm        0.35/0.25  0.36/0.24 branching +-----------------+-------------+----------+---------+ Distal forearm       0.21        0.42             +-----------------+-------------+----------+---------+ Wrist                0.37        0.58             +-----------------+-------------+----------+---------+ +-----------------+-------------+----------+---------+ Left Cephalic    Diameter (cm)Depth (cm)Findings  +-----------------+-------------+----------+---------+ Prox upper arm       0.65        0.47             +-----------------+-------------+----------+---------+  Mid upper arm        0.48        0.55             +-----------------+-------------+----------+---------+ Dist upper arm       0.63        0.48             +-----------------+-------------+----------+---------+ Antecubital fossa    0.79        0.45             +-----------------+-------------+----------+---------+ Prox forearm       0.49/0.45  0.70/0.62 branching +-----------------+-------------+----------+---------+ Mid forearm          0.42        0.46             +-----------------+-------------+----------+---------+ Dist forearm         0.38        0.46              +-----------------+-------------+----------+---------+ Wrist                0.37        0.58             +-----------------+-------------+----------+---------+ +-----------------+-------------+----------+--------+ Left Basilic     Diameter (cm)Depth (cm)Findings +-----------------+-------------+----------+--------+ Prox upper arm       0.54        0.17    origin  +-----------------+-------------+----------+--------+ Mid upper arm        0.76        0.98            +-----------------+-------------+----------+--------+ Dist upper arm       0.42        0.97            +-----------------+-------------+----------+--------+ Antecubital fossa    0.57        1.22            +-----------------+-------------+----------+--------+ Prox forearm         0.40        0.55            +-----------------+-------------+----------+--------+ Mid forearm          0.21        0.40            +-----------------+-------------+----------+--------+ Distal forearm       0.15        0.25            +-----------------+-------------+----------+--------+ Wrist                0.17        0.38            +-----------------+-------------+----------+--------+ *See table(s) above for measurements and observations.  Diagnosing physician: Fonda Rim Electronically signed by Fonda Rim on 01/10/2024 at 10:26:10 AM.    Final    US  RENAL Result Date: 01/09/2024 CLINICAL DATA:  Acute kidney injury EXAM: RENAL / URINARY TRACT ULTRASOUND COMPLETE COMPARISON:  CT abdomen pelvis 11/02/2023 FINDINGS: Right Kidney: Not well visualized due to adjacent shadowing bowel gas. Left Kidney: Not well visualized due to adjacent shadowing bowel gas. Bladder: Appears normal for degree of bladder distention. Other: None. IMPRESSION: Nonvisualization of the kidneys due to patient body habitus and adjacent shadowing bowel gas. Electronically Signed   By: Aliene Lloyd M.D.   On: 01/09/2024 12:44   DG Chest Port 1  View Result Date: 01/08/2024 CLINICAL DATA:  Shortness of breath EXAM: PORTABLE CHEST 1 VIEW COMPARISON:  Chest x-ray 01/05/2024  FINDINGS: The cardiac silhouette is enlarged, unchanged. Sternotomy wires are present. There is no focal lung infiltrate, pleural effusion or pneumothorax. There stable elevation of the left hemidiaphragm. No acute fractures are seen. IMPRESSION: 1. No active disease. 2. Stable cardiomegaly. Electronically Signed   By: Greig Pique M.D.   On: 01/08/2024 19:12   DG Chest 2 View Result Date: 01/05/2024 CLINICAL DATA:  Shortness of breath EXAM: CHEST - 2 VIEW COMPARISON:  Chest radiograph dated 11/09/2023 FINDINGS: Unchanged asymmetric elevation of the left hemidiaphragm. Normal lung volumes. Right basilar patchy opacity. No pleural effusion or pneumothorax. Similar enlarged cardiomediastinal silhouette. Median sternotomy wires are nondisplaced. IMPRESSION: 1. Right basilar patchy opacity, likely atelectasis. 2. Similar cardiomegaly. Electronically Signed   By: Limin  Xu M.D.   On: 01/05/2024 15:18   US  Abdomen Limited RUQ (LIVER/GB) Result Date: 12/24/2023 CLINICAL DATA:  Hollie EXAM: ULTRASOUND ABDOMEN LIMITED RIGHT UPPER QUADRANT COMPARISON:  CT abdomen pelvis 11/02/2023 FINDINGS: Gallbladder: Surgically absent Common bile duct: Diameter: 4 mm Liver: Increased echogenicity. No focal lesion. Portal vein is patent on color Doppler imaging with normal direction of blood flow towards the liver. Other: None. IMPRESSION: 1. Increased hepatic parenchymal echogenicity suggestive of steatosis. 2. Status post cholecystectomy. Electronically Signed   By: Bard Moats M.D.   On: 12/24/2023 12:47   CT SINUS WO CONTRAST Result Date: 11/24/2023 CLINICAL DATA:  Recurring sinus infection EXAM: CT MAXILLOFACIAL WITHOUT CONTRAST TECHNIQUE: Multidetector CT images of the paranasal sinuses were obtained using the standard protocol without intravenous contrast. RADIATION DOSE REDUCTION: This exam was  performed according to the departmental dose-optimization program which includes automated exposure control, adjustment of the mA and/or kV according to patient size and/or use of iterative reconstruction technique. COMPARISON:  No prior CT sinus or maxillofacial available. Correlation is made with CT head 10/01/2010 FINDINGS: Paranasal sinuses: Frontal: Normally aerated. Patent frontal sinus drainage pathways. Ethmoid: Normally aerated. Maxillary: Normally aerated. Sphenoid: Normally aerated. Patent sphenoethmoidal recesses. No evidence of osseous thickening or erosion. Right ostiomeatal unit: Patent. Left ostiomeatal unit: Patent. Nasal passages: Patent. Intact nasal septum is midline. Anatomy: No pneumatization superior to anterior ethmoid notches. Symmetric and intact olfactory grooves and fovea ethmoidalis, Keros II (4-13mm). Conchal sphenoid pneumatization pattern. No dehiscence of carotid or optic canals. No onodi cell. Other: Orbits and intracranial compartment are unremarkable. Visible mastoid air cells are normally aerated. IMPRESSION: Normally aerated paranasal sinuses. Patent sinus drainage pathways. No evidence of acute or chronic sinusitis. Electronically Signed   By: Donald Campion M.D.   On: 11/24/2023 20:40   DG Chest 2 View Result Date: 11/16/2023 CLINICAL DATA:  53 year old male with fluid overload EXAM: CHEST - 2 VIEW COMPARISON:  11/02/2023 FINDINGS: Cardiomediastinal silhouette unchanged with surgical changes of median sternotomy and CABG. No pneumothorax pleural effusion or confluent airspace disease. Similar appearance of asymmetric elevation the left hemidiaphragm. Coarse interstitial markings. Degenerative changes the spine.  No displaced fracture IMPRESSION: Negative for acute cardiopulmonary disease Electronically Signed   By: Ami Bellman D.O.   On: 11/16/2023 10:20      Assessment & Plan ESRD (end stage renal disease) on dialysis [facility]) Assessment: Patient remains on thrice  weekly hemodialysis with adequate fluid management. Recent hospitalization appears to have stabilized his condition. Patient reports adherence to fluid restriction of 32 oz daily as directed. Recent labs show persistent anemia and thrombocytopenia, likely related to his ESRD. Creatinine remains elevated at 5.98-6.95, consistent with end-stage disease. Plan: Continue hemodialysis three times weekly per nephrology schedule Maintain fluid restriction  of 32 oz daily Continue calcium  acetate with meals for phosphate binding Follow up with nephrology as scheduled Support application for Social Security Disability Insurance (SSDI) and Medicare eligibility Monitor weight and fluid status; consider using ankle circumference measurements to track fluid status Coordinate care with nephrology regarding medication adjustments Abdominal pain, epigastric Resolving, continue(s) with carafate  a little longer and proton pump inhibitor (PPI) stomach acid reducer  Coronary artery disease involving native coronary artery of native heart without angina pectoris alternative therapy (Repatha ). Plan: Continue diltiazem  (Cardizem ) 120mg  twice daily Continue Eliquis  5mg  twice daily Continue Repatha  injections every 2 weeks for LDL management (statin contraindicated) Maintain nitroglycerin  0.4mg  SL prn for chest pain Monitor for cardiac symptoms Encourage heart-healthy lifestyle modifications Chronic venous stasis dermatitis of both lower extremities Assessment: Bilateral lower extremity involvement, likely exacerbated by fluid status and obesity. Plan: Continue sequential compression devices as recommended Apply ketoconazole  cream to affected areas twice daily as needed Elevate legs when sitting or lying down Consider compression stockings if tolerated Chronic liver failure without hepatic coma (HCC) Important to be on GLP-1 agonist for weight loss.  Gastroesophageal reflux disease without esophagitis He  experiences heartburn and vomiting, possibly related to peptic ulcer disease. Sensitivity to certain foods suggests an active ulcer. Carafate  is prescribed to coat the ulcer and aid healing. He should continue Carafate  for up to a month after symptom resolution and avoid spicy foods and other known irritants to prevent symptom exacerbation. Monitoring for symptoms of heartburn and vomiting is advised.\ Mixed hyperlipidemia Assessment: Managed with Repatha  due to statin contraindication. Weight loss efforts should help improve lipid profile. Plan: Continue Repatha  injections every 2 weeks Ensure Medicare coverage for medication (approximately $700 monthly) Anticipate lipid profile improvement with Mounjaro  therapy and weight loss Recheck lipid panel at next visit Morbid obesity (HCC) Assessment: Patient with BMI 52.35, down from 53.19 at previous visit, showing modest improvement. Patient expresses interest in restarting Mounjaro  for weight management, which was previously discontinued due to dehydration concerns. Plan: Restart tirzepatide  (Mounjaro ) 2.5mg  weekly subcutaneous injection (ordered with 3 refills) Begin at lowest dose (2.5mg ) and titrate based on tolerance Monitor for dehydration, nausea, and vomiting, particularly given ESRD and dialysis dependence Dietitian referral to be placed for nutritional counseling compatible with renal diet Encourage regular physical activity as tolerated Reassess effectiveness and tolerance at next visit NASH (nonalcoholic steatohepatitis) Assessment: Patient with known NASH, as evidenced by elevated liver enzymes and imaging showing increased hepatic echogenicity. Recent ultrasound confirmed steatosis. Plan: GLP-1 agonist therapy (Mounjaro ) should benefit NASH Monitor liver enzymes Encourage weight loss and healthy diet Avoid hepatotoxic medications and alcohol Nausea and vomiting, unspecified vomiting type From end stage renal disease on dialysis  patient dialysis, likely caused resolving peptic ulcer disease.  Well-controlled now Obstructive sleep apnea syndrome Assessment: Patient reports difficulties with CPAP mask tolerance. Sleep apnea likely exacerbated by obesity. Plan: Encourage weight loss via Mounjaro  therapy which may improve OSA symptoms Encourage continued CPAP use despite difficulties Discuss alternative mask options if current one is problematic Consider sleep medicine referral if symptoms worsen Thrombocytopenia (HCC) Assessment: Persistent thrombocytopenia with platelets ranging 66-70 K/uL. Likely multifactorial related to ESRD, chronic liver disease, and potentially medication effects. Plan: Continue monitoring platelet counts with regular blood work No specific intervention required at this time given stability Avoid medications that may worsen thrombocytopenia  Hypertension, unspecified type Assessment: Blood pressure improved at today's visit (115/71) compared to previous readings. Overall reasonably controlled on current regimen. Plan: Continue current antihypertensive regimen Monitor blood pressure at  home regularly Adjust therapy based on blood pressure trend and volume status Continue sodium restriction and fluid management  Disability affecting daily living Assessment: Patient reports inability to work due to dialysis schedule and overall health status. Plan: Support application for Social Security Disability Insurance (SSDI) Complete necessary documentation for disability application Provide information regarding Medicare eligibility with ESRD If desired, will arrange for social work consultation to assist with application process PUD (peptic ulcer disease) Assessment: Patient reports episodic vomiting, particularly after consuming spicy foods. Symptoms suggest active peptic ulcer disease with recent exacerbation. Currently using Carafate  and Protonix  with some symptom control. Plan: Continue  Carafate  1g at bedtime for the next month Continue Protonix  40mg  twice daily before meals but this is not desirable long term due to dialysis Reordered sucralfate  (Carafate ) 1g tablet daily at bedtime, 30-day supply with 1 refill Advise dietary modifications: avoid spicy foods, acidic foods, alcohol, and NSAIDs Continue Pepcid  Complete as needed for breakthrough symptoms Follow up in 4 weeks to assess symptom improvement ESRD on dialysis (HCC)  Metabolic dysfunction-associated steatohepatitis (MASH)       Orders Placed During this Encounter:   Meds ordered this encounter  Medications   sucralfate  (CARAFATE ) 1 g tablet    Sig: Take 1 tablet (1 g total) by mouth at bedtime.    Dispense:  30 tablet    Refill:  1   tirzepatide  (MOUNJARO ) 2.5 MG/0.5ML Pen    Sig: Inject 2.5 mg into the skin once a week.    Dispense:  2 mL    Refill:  3     Created follow up plan for our visit in 1 month: # PERSONAL SPECIALTY NOTES - NEXT VISIT  ## Key Follow-ups: - **ESRD/Nephrology**: Check if Mounjaro  was approved by nephrology; document dry weight/dialysis adequacy - **GI Status**: Evaluate response to Carafate  for ulcer symptoms; reassess vomiting frequency - **Weight Management**: Document response to Mounjaro  2.5mg  weekly - any side effects or dehydration? - **Disability**: Verify if patient connected with [facility] team and progress on SSDI application  ## Care Gaps: - Permanent dialysis access planning (still using catheter placed 01/11/24) - Thrombocytopenia evaluation (platelets consistently 66-70K) - CPAP compliance remains poor - follow up on mask tolerance - No recent cardiology follow-up despite CAD/elevated BNP (430.6)  ## Critical Labs to Review: - Creatinine trend (baseline 5.98-6.95) - Potassium levels (risk with GLP-1 therapy) - Liver enzymes (monitor NASH) - Platelet count (chronic thrombocytopenia)  ## Medication Adjustments to Consider: - Mounjaro : Evaluate for dose  adjustment based on tolerance - Carafate : Determine if continued therapy needed - Consider restarting allopurinol  if no active ulcer (was on problem list)  ## Personal Reminders: - Document weight change (last: 375 lbs, down from 400) - Review ankle measurements if patient followed recommendation - Check insurance coverage confirmation for Repatha  - Consider GI referral if ulcer symptoms persist  ## Clinical Context: - Complex patient with interacting conditions: ESRD, CAD, PUD, obesity, NASH - Recent hospitalization (01/05/24) for volume management - Significant weight loss effort showing modest results - Will need careful coordination with nephrology regarding medication adjustments     **This document was synthesized by artificial intelligence (Abridge) using HIPAA-compliant recording of the clinical interaction;   We discussed the use of AI scribe software for clinical note transcription with the patient, who gave verbal consent to proceed.    Additional Info: This encounter employed state-of-the-art, real-time, collaborative documentation. The patient actively reviewed and assisted in updating their electronic medical record on a shared screen, ensuring transparency  and facilitating joint problem-solving for the problem list, overview, and plan. This approach promotes accurate, informed care. The treatment plan was discussed and reviewed in detail, including medication safety, potential side effects, and all patient questions. We confirmed understanding and comfort with the plan. Follow-up instructions were established, including contacting the office for any concerns, returning if symptoms worsen, persist, or new symptoms develop, and precautions for potential emergency department visits.  ## MEDICAL DECISION MAKING ATTESTATION  This encounter represents high complexity medical decision making based on the following elements:  ### 1. Number and Complexity of Problems  Addressed: Medical decision making involves management of multiple chronic illnesses with severe exacerbations, including: - End-stage renal disease requiring ongoing dialysis, recently hospitalized (01/05/2024) - Active peptic ulcer disease with recent symptomatic exacerbation - Morbid obesity (BMI 52.35) with multiple related comorbidities - Stable coronary artery disease status post CABG - Non-alcoholic steatohepatitis (NASH) - Chronic thrombocytopenia - Obstructive sleep apnea with suboptimal control - Hypertension  The combination of ESRD on dialysis with multiple interacting comorbidities presents a high risk for complications, requires careful coordination of care, and necessitates extensive management decisions with consideration of multiple organ systems.  ### 2. Amount and/or Complexity of Data Reviewed: Today's assessment required comprehensive review of: - Multiple recent lab results from 01/18/2024, 01/05/2024, 12/14/2023, 12/01/2023, and 11/09/2023, including:   - Complete blood counts showing anemia and thrombocytopenia   - Comprehensive metabolic panels showing renal dysfunction   - Liver function tests showing elevated transaminases   - Specialty labs including ferritin, immunoglobulins, and light chains - Recent diagnostic imaging:   - Shoulder x-ray (01/14/2024)   - Chest x-ray (01/11/2024)   - Renal ultrasound (01/09/2024)   - Liver ultrasound (12/24/2023)   - CT sinus (11/24/2023) - Weight trends from past 10 encounters - Vital sign trends - Analysis of medication interactions across multiple organ systems - Recent hospitalization records (01/05/2024)  The extensive data review from multiple sources supports high complexity decision making.  ### 3. Risk of Complications and/or Morbidity/Mortality: Patient presents with high-risk management decisions including: - Initiation of GLP-1 agonist (Mounjaro ) in a patient with ESRD on dialysis, requiring careful  assessment of benefits versus risks of dehydration, electrolyte disturbances, and medication clearance especially with coinciding peptic ulcer disease with bleeding diathesis. - Management of peptic ulcer disease with recent exacerbation in a patient with complex medical issues - Management of thrombocytopenia which increases bleeding risk with procedures and certain medications - Coordination of care across multiple specialists (nephrology, cardiology, gastroenterology) - Complex medication regimen requiring assessment for interactions, dosing in renal failure, and risk-benefit analysis  The decision to restart Mounjaro  in a dialysis-dependent patient requires careful consideration of potential complications, including dehydration, electrolyte abnormalities, and gastrointestinal side effects. The risk of worsening his peptic ulcer disease must be balanced against the benefits of weight loss for his multiple comorbidities.  The combination of multiple serious chronic illnesses, including ESRD on dialysis, CAD, PUD, and NASH, presents a high risk of morbidity and mortality, requiring complex medical decision making to optimize outcomes and prevent complications.  This encounter meets criteria for high complexity medical decision making (00784) based on management of multiple high-risk chronic conditions, extensive data review, and high risk of complications from therapeutic decisions.  I have personally spent 42 minutes involved in face-to-face and non-face-to-face activities for this patient on the day of the visit. Professional time spent includes the following activities: Preparing to see the patient (review of tests), Obtaining and/or reviewing separately obtained history (admission/discharge record),  Performing a medically appropriate examination and/or evaluation , Ordering medications/tests/procedures, referring and communicating with other health care professionals, Documenting clinical  information in the EMR, Independently interpreting results (not separately reported), Communicating results to the patient/family/caregiver, Counseling and educating the patient/family/caregiver and Care coordination (not separately reported).

## 2024-02-06 DIAGNOSIS — K279 Peptic ulcer, site unspecified, unspecified as acute or chronic, without hemorrhage or perforation: Secondary | ICD-10-CM | POA: Insufficient documentation

## 2024-02-06 DIAGNOSIS — R5381 Other malaise: Secondary | ICD-10-CM | POA: Insufficient documentation

## 2024-02-06 DIAGNOSIS — N186 End stage renal disease: Secondary | ICD-10-CM | POA: Diagnosis not present

## 2024-02-06 DIAGNOSIS — N2581 Secondary hyperparathyroidism of renal origin: Secondary | ICD-10-CM | POA: Diagnosis not present

## 2024-02-06 DIAGNOSIS — Z992 Dependence on renal dialysis: Secondary | ICD-10-CM | POA: Diagnosis not present

## 2024-02-06 NOTE — Assessment & Plan Note (Signed)
 Assessment: Persistent thrombocytopenia with platelets ranging 66-70 K/uL. Likely multifactorial related to ESRD, chronic liver disease, and potentially medication effects. Plan: Continue monitoring platelet counts with regular blood work No specific intervention required at this time given stability Avoid medications that may worsen thrombocytopenia

## 2024-02-06 NOTE — Assessment & Plan Note (Signed)
 From end stage renal disease on dialysis patient dialysis, likely caused resolving peptic ulcer disease.  Well-controlled now

## 2024-02-06 NOTE — Assessment & Plan Note (Signed)
 Assessment: Patient remains on thrice weekly hemodialysis with adequate fluid management. Recent hospitalization appears to have stabilized his condition. Patient reports adherence to fluid restriction of 32 oz daily as directed. Recent labs show persistent anemia and thrombocytopenia, likely related to his ESRD. Creatinine remains elevated at 5.98-6.95, consistent with end-stage disease. Plan: Continue hemodialysis three times weekly per nephrology schedule Maintain fluid restriction of 32 oz daily Continue calcium acetate with meals for phosphate binding Follow up with nephrology as scheduled Support application for Social Security Disability Insurance (SSDI) and Medicare eligibility Monitor weight and fluid status; consider using ankle circumference measurements to track fluid status Coordinate care with nephrology regarding medication adjustments

## 2024-02-06 NOTE — Assessment & Plan Note (Signed)
 He experiences heartburn and vomiting, possibly related to peptic ulcer disease. Sensitivity to certain foods suggests an active ulcer. Carafate is prescribed to coat the ulcer and aid healing. He should continue Carafate for up to a month after symptom resolution and avoid spicy foods and other known irritants to prevent symptom exacerbation. Monitoring for symptoms of heartburn and vomiting is advised.\

## 2024-02-06 NOTE — Assessment & Plan Note (Signed)
 Important to be on GLP-1 agonist for weight loss.

## 2024-02-06 NOTE — Assessment & Plan Note (Signed)
 Assessment: Managed with Repatha due to statin contraindication. Weight loss efforts should help improve lipid profile. Plan: Continue Repatha injections every 2 weeks Ensure Medicare coverage for medication (approximately $700 monthly) Anticipate lipid profile improvement with Mounjaro therapy and weight loss Recheck lipid panel at next visit

## 2024-02-06 NOTE — Assessment & Plan Note (Signed)
 Assessment: Patient reports inability to work due to dialysis schedule and overall health status. Plan: Support application for Social Security Disability Insurance (SSDI) Complete necessary documentation for disability application Provide information regarding Medicare eligibility with ESRD If desired, will arrange for social work consultation to assist with application process

## 2024-02-06 NOTE — Assessment & Plan Note (Signed)
 Assessment: Blood pressure improved at today's visit (115/71) compared to previous readings. Overall reasonably controlled on current regimen. Plan: Continue current antihypertensive regimen Monitor blood pressure at home regularly Adjust therapy based on blood pressure trend and volume status Continue sodium restriction and fluid management

## 2024-02-06 NOTE — Assessment & Plan Note (Signed)
 Assessment: Bilateral lower extremity involvement, likely exacerbated by fluid status and obesity. Plan: Continue sequential compression devices as recommended Apply ketoconazole cream to affected areas twice daily as needed Elevate legs when sitting or lying down Consider compression stockings if tolerated

## 2024-02-06 NOTE — Assessment & Plan Note (Signed)
 Assessment: Patient with BMI 52.35, down from 53.19 at previous visit, showing modest improvement. Patient expresses interest in restarting Mounjaro for weight management, which was previously discontinued due to dehydration concerns. Plan: Restart tirzepatide Greggory Keen) 2.5mg  weekly subcutaneous injection (ordered with 3 refills) Begin at lowest dose (2.5mg ) and titrate based on tolerance Monitor for dehydration, nausea, and vomiting, particularly given ESRD and dialysis dependence Dietitian referral to be placed for nutritional counseling compatible with renal diet Encourage regular physical activity as tolerated Reassess effectiveness and tolerance at next visit

## 2024-02-06 NOTE — Assessment & Plan Note (Signed)
 Assessment: Patient reports episodic vomiting, particularly after consuming spicy foods. Symptoms suggest active peptic ulcer disease with recent exacerbation. Currently using Carafate and Protonix with some symptom control. Plan: Continue Carafate 1g at bedtime for the next month Continue Protonix 40mg  twice daily before meals but this is not desirable long term due to dialysis Reordered sucralfate (Carafate) 1g tablet daily at bedtime, 30-day supply with 1 refill Advise dietary modifications: avoid spicy foods, acidic foods, alcohol, and NSAIDs Continue Pepcid Complete as needed for breakthrough symptoms Follow up in 4 weeks to assess symptom improvement

## 2024-02-06 NOTE — Assessment & Plan Note (Signed)
 Assessment: Patient reports difficulties with CPAP mask tolerance. Sleep apnea likely exacerbated by obesity. Plan: Encourage weight loss via Mounjaro therapy which may improve OSA symptoms Encourage continued CPAP use despite difficulties Discuss alternative mask options if current one is problematic Consider sleep medicine referral if symptoms worsen

## 2024-02-06 NOTE — Assessment & Plan Note (Signed)
 alternative therapy (Repatha). Plan: Continue diltiazem (Cardizem) 120mg  twice daily Continue Eliquis 5mg  twice daily Continue Repatha injections every 2 weeks for LDL management (statin contraindicated) Maintain nitroglycerin 0.4mg  SL prn for chest pain Monitor for cardiac symptoms Encourage heart-healthy lifestyle modifications

## 2024-02-08 ENCOUNTER — Telehealth: Payer: Self-pay

## 2024-02-08 NOTE — Telephone Encounter (Signed)
 Copied from CRM [id number]. Topic: Clinical - Prescription Issue >> Feb 08, 2024 10:19 AM Drema Balzarine wrote: Reason for CRM: Patient called says the Sucralfate and Tirzepatide medications were sent to the wrong pharmacy [facility]) but I did let him know that both were sent to the correct [facility] on file. I told pateint I will let Dr. Jon Billings know but in the meantime advised patient to call [facility] to check if the meds are there     Patient was already advised appropriately concerning this. Donzetta Starch, CMA

## 2024-02-09 DIAGNOSIS — N186 End stage renal disease: Secondary | ICD-10-CM | POA: Diagnosis not present

## 2024-02-09 DIAGNOSIS — N2581 Secondary hyperparathyroidism of renal origin: Secondary | ICD-10-CM | POA: Diagnosis not present

## 2024-02-09 DIAGNOSIS — Z992 Dependence on renal dialysis: Secondary | ICD-10-CM | POA: Diagnosis not present

## 2024-02-10 ENCOUNTER — Encounter (HOSPITAL_COMMUNITY): Payer: BC Managed Care – PPO

## 2024-02-10 ENCOUNTER — Other Ambulatory Visit (HOSPITAL_COMMUNITY): Payer: BC Managed Care – PPO

## 2024-02-10 ENCOUNTER — Encounter: Payer: BC Managed Care – PPO | Admitting: Vascular Surgery

## 2024-02-11 DIAGNOSIS — N186 End stage renal disease: Secondary | ICD-10-CM | POA: Diagnosis not present

## 2024-02-11 DIAGNOSIS — Z992 Dependence on renal dialysis: Secondary | ICD-10-CM | POA: Diagnosis not present

## 2024-02-11 DIAGNOSIS — N2581 Secondary hyperparathyroidism of renal origin: Secondary | ICD-10-CM | POA: Diagnosis not present

## 2024-02-12 ENCOUNTER — Encounter: Payer: Self-pay | Admitting: Internal Medicine

## 2024-02-12 ENCOUNTER — Ambulatory Visit (INDEPENDENT_AMBULATORY_CARE_PROVIDER_SITE_OTHER): Admitting: Internal Medicine

## 2024-02-12 VITALS — BP 126/76 | HR 55 | Temp 98.2°F | Ht 71.0 in | Wt 375.0 lb

## 2024-02-12 DIAGNOSIS — R5381 Other malaise: Secondary | ICD-10-CM | POA: Diagnosis not present

## 2024-02-12 DIAGNOSIS — N186 End stage renal disease: Secondary | ICD-10-CM

## 2024-02-12 NOTE — Patient Instructions (Addendum)
 VISIT SUMMARY:  Sean [name], a 53 year old male with end stage renal disease, came in for a follow-up and assistance with disability paperwork. He started dialysis in February 2025 due to severe kidney issues, which has impacted his ability to work and perform daily activities. He experiences frequent health issues such as nausea, vomiting, and shortness of breath. He relies on family for transportation to dialysis appointments and is seeking additional support for transportation and financial assistance.  YOUR PLAN:  -END STAGE RENAL DISEASE (ESRD): End stage renal disease is the final stage of chronic kidney disease where the kidneys can no longer function on their own, requiring dialysis. Sean [name] will continue dialysis three times a week and is considering a kidney transplant in six months. We will write letters to Social Security and Medicare for disability and insurance coverage and refer him to a Child psychotherapist for assistance with these applications. We will also discuss the potential for restarting weight loss medication if his nausea is controlled.  -TRANSPORTATION ASSISTANCE: Due to post-dialysis weakness, Sean [name] needs additional transportation assistance for his dialysis appointments. We will refer him to a social worker to explore options such as free bus passes.  -FINANCIAL ASSISTANCE: Sean [name] is seeking financial assistance due to his inability to work and the costs associated with dialysis. We will refer him to a social worker to help with financial planning and Medicaid application. 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  INSTRUCTIONS:  Please schedule a follow-up appointment in one month to review the progress on your disability and Medicare applications and to ensure there are no issues with your dialysis management.  It was a pleasure seeing you today! Your health and satisfaction are our top priorities.  Glenetta Hew, MD  Your Providers PCP:  Lula Olszewski, MD,  [phone]) Referring Provider: Lula Olszewski, MD,  [phone]) Care Team Provider: Christell Constant, MD,  [phone]) Care Team Provider: Alejandro Mulling, RN Care Team Provider: Weston Settle, MD,  [phone]) Care Team Provider: Tressia Danas, MD Care Team Provider: Estanislado Emms, MD,  [phone])     NEXT STEPS: [x]  Early Intervention: Schedule sooner appointment, call our on-call services, or go to emergency room if there is any significant Increase in pain or discomfort New or worsening symptoms Sudden or severe changes in your health [x]  Flexible Follow-Up: We recommend a Return in about 1 month (around 03/14/2024). for optimal routine care. This allows for progress monitoring and treatment adjustments. [x]  Preventive Care: Schedule your annual preventive care visit! It's typically covered by insurance and helps identify potential health issues early. [x]  Lab & X-ray Appointments: Incomplete tests scheduled today, or call to schedule. X-rays: [facility] (M-F, 8:30am-noon or 1pm-5pm). [x]  Medical Information Release: Sign a release form at front desk to obtain relevant medical information we don't have.  MAKING THE MOST OF OUR FOCUSED 20 MINUTE APPOINTMENTS: [x]   Clearly state your top concerns at the beginning of the visit to focus our discussion [x]   If you anticipate you will need more time, please inform the front desk during scheduling - we can book multiple appointments in the same week. [x]   If you have transportation problems- use our convenient video appointments or ask about transportation support. [x]   We can get down to business faster if you use MyChart to update information before the visit and submit non-urgent questions before your visit. Thank you for taking the time to provide details through MyChart.  Let our nurse know and she can import this information into your encounter  documents.  Arrival and Wait Times: [x]   Arriving on time ensures that everyone receives prompt attention. [x]   Early morning (8a) and afternoon (1p) appointments tend to have shortest wait times. [x]   Unfortunately, we cannot delay appointments for late arrivals or hold slots during phone calls.  Getting Answers and Following Up [x]   Simple Questions & Concerns: For quick questions or basic follow-up after your visit, reach [place] at [phone] or MyChart messaging. [x]   Complex Concerns: If your concern is more complex, scheduling an appointment might be best. Discuss this with the staff to find the most suitable option. [x]   Lab & Imaging Results: We'll contact you directly if results are abnormal or you don't use MyChart. Most normal results will be on MyChart within 2-3 business days, with a review message from Dr. Jon Billings. Haven't heard back in 2 weeks? Need results sooner? Contact us at [phone]. [x]   Referrals: Our referral coordinator will manage specialist referrals. The specialist's office should contact you within 2 weeks to schedule an appointment. Call us if you haven't heard from them after 2 weeks.  Staying Connected [x]   MyChart: Activate your MyChart for the fastest way to access results and message [place]. See the last page of this paperwork for instructions on how to activate.  Bring to Your Next Appointment [x]   Medications: Please bring all your medication bottles to your next appointment to ensure we have an accurate record of your prescriptions. [x]   Health Diaries: If you're monitoring any health conditions at home, keeping a diary of your readings can be very helpful for discussions at your next appointment.  Billing [x]   X-ray & Lab Orders: These are billed by separate companies. Contact the invoicing company directly for questions or concerns. [x]   Visit Charges: Discuss any billing inquiries with our administrative services team.  Your Satisfaction Matters [x]    Share Your Experience: We strive for your satisfaction! If you have any complaints, or preferably compliments, please let Dr. Jon Billings know directly or contact our Practice Administrators, Edwena Felty or [employer], by asking at the front desk.   Reviewing Your Records [x]   Review this early draft of your clinical encounter notes below and the final encounter summary tomorrow on MyChart after its been completed.  All orders placed so far are visible here: Disability affecting daily living Assessment & Plan:  Transportation Assistance   He needs transportation assistance for dialysis appointments. Family members currently help, but he seeks additional options due to post-dialysis weakness affecting his ability to drive. Refer to a Child psychotherapist to explore transportation assistance options such as free bus passes.  Financial Assistance   He seeks financial assistance due to inability to work and dialysis costs. 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 He is advised to reduce income and assets to qualify for Medicaid. Refer to a Child psychotherapist to assist with financial planning and Medicaid application. 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  Orders: -     AMB Referral [facility] Care Management  ESRD (end stage renal disease) (HCC) Assessment & Plan: End Stage Renal Disease (ESRD)   He has ESRD requiring dialysis since January 08, 2024, causing recurrent nausea, vomiting, and dyspnea due to fluid overload. He cannot work as a [employer] because of thrice-weekly dialysis and related health issues. The  cause of renal failure is unclear but may be linked to past dehydration and weight issues. He is considering a kidney transplant in six months once stabilized. Write letters to [employer] and Medicare for disability and insurance coverage. Refer to a Child psychotherapist  for assistance with disability and Medicare benefits application. Discuss potential kidney transplant with a nephrologist in six months. Consider restarting weight loss medication at a low dose if nausea is controlled.    Orders: -     AMB Referral [facility] Care Management

## 2024-02-13 DIAGNOSIS — Z992 Dependence on renal dialysis: Secondary | ICD-10-CM | POA: Diagnosis not present

## 2024-02-13 DIAGNOSIS — N2581 Secondary hyperparathyroidism of renal origin: Secondary | ICD-10-CM | POA: Diagnosis not present

## 2024-02-13 DIAGNOSIS — N186 End stage renal disease: Secondary | ICD-10-CM | POA: Diagnosis not present

## 2024-02-13 NOTE — Assessment & Plan Note (Signed)
 End Stage Renal Disease (ESRD)   He has ESRD requiring dialysis since January 08, 2024, causing recurrent nausea, vomiting, and dyspnea due to fluid overload. He cannot work as a [employer] because of thrice-weekly dialysis and related health issues. The cause of renal failure is unclear but may be linked to past dehydration and weight issues. He is considering a kidney transplant in six months once stabilized. Write letters to [employer] and Medicare for disability and insurance coverage. Refer to a Child psychotherapist for assistance with disability and Medicare benefits application. Discuss potential kidney transplant with a nephrologist in six months. Consider restarting weight loss medication at a low dose if nausea is controlled.

## 2024-02-13 NOTE — Assessment & Plan Note (Signed)
  Transportation Assistance   He needs transportation assistance for dialysis appointments. Family members currently help, but he seeks additional options due to post-dialysis weakness affecting his ability to drive. Refer to a Child psychotherapist to explore transportation assistance options such as free bus passes.  Financial Assistance   He seeks financial assistance due to inability to work and dialysis costs. 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 He is advised to reduce income and assets to qualify for Medicaid. Refer to a Child psychotherapist to assist with financial planning and Medicaid application. 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

## 2024-02-13 NOTE — Progress Notes (Signed)
 ==============================   [facility]: [phone]   -- Medical Office Visit --  Patient: Sean [name]      Age: 53 y.o.       Sex:  male  Date:   02/12/2024 Today's Healthcare Provider: Lula Olszewski, MD  ==============================    CHIEF COMPLAINT: One week follow-up Patient assistance with Social Security Disability Insurance (SSDI)/medicare app now that he is on dialysis and can't work.  Background This is a 53 y.o. male who has History of umbilical hernia repair; Hypertension; Hyperlipidemia; Thrombocytopenia (HCC); Sinus bradycardia; CAD (coronary artery disease); Atrial flutter with rapid ventricular response (HCC); Morbid obesity (HCC); Chronic kidney disease, stage 4 (severe) (HCC); Other long term (current) drug therapy; History of coronary artery bypass surgery; Obstructive sleep apnea syndrome; Chronic gout of multiple sites; Alkaline phosphatase elevation; Gout; Ectatic aorta (HCC); Gastroesophageal reflux disease without esophagitis; Herpes simplex; Metabolic dysfunction-associated steatohepatitis (MASH); Prediabetes; Statins contraindicated; Hypothyroid; Chronic venous stasis dermatitis of both lower extremities; Nausea and vomiting; Chronic liver failure without hepatic coma (HCC); Splenomegaly; Spondylolisthesis; Bleeding diathesis (HCC); Malaise and fatigue; Allergic rhinitis due to pollen; Amitriptyline adverse reaction; MGUS (monoclonal gammopathy of unknown significance); 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  History of Present Illness Sean [name] Squitieri  "[name]" is a 53 year old male with end stage renal disease who presents for a one week follow-up and assistance with disability paperwork.  He has end stage renal disease and started dialysis on January 08, 2024, after being hospitalized for recurrent nausea and vomiting due to elevated blood urea nitrogen levels. His glomerular filtration rate (GFR) dropped significantly from 50 three years ago to a level requiring dialysis. He undergoes dialysis three times a week on Tuesday, Thursday, and Saturday.  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 He experiences frequent health flares, including nausea and vomiting, and must stay close to the hospital. He has shortness of breath with exertion, worsened by fluid buildup from kidney failure, and is unable to perform instrumental activities of daily living such as shopping.  He is not currently driving due to his condition and relies on family members for transportation to dialysis appointments. His aunts and mother assist with transportation on different days of the week.  His current medications are generally affordable, costing zero to five dollars, but he notes that costs increase after February or March. He has a port catheter for dialysis, which requires special maintenance, as he cannot use his fistula yet.  Visually reviewed and manually updated: Current Outpatient Medications on File Prior to Visit  Medication Sig   acetaminophen (TYLENOL) 500 MG tablet Take 1,500 mg by mouth once as needed for moderate pain (pain score 4-6), fever, headache or mild pain (pain score 1-3).   allopurinol (ZYLOPRIM) 100 MG tablet Take 1 tablet (100 mg total) by mouth daily.   amitriptyline (ELAVIL) 25 MG tablet Take 1 tablet (25 mg total) by mouth at bedtime. (Patient taking differently: Take 25 mg by mouth at bedtime.)   azelastine (ASTELIN) 0.1 % nasal spray Place 2 sprays into both nostrils 2 (two) times daily. Use in each  nostril as directed (Patient taking differently: Place  2 sprays into both nostrils as needed for allergies. Use in each nostril as directed)   B Complex-C-Folic Acid (DIALYVITE TABLET) TABS Take by mouth.   calcium acetate (PHOSLO) 667 MG capsule Take 2 capsules (1,334 mg total) by mouth 3 (three) times daily with meals.   Calcium Carb-Cholecalciferol 600-25 MG-MCG CAPS Take by mouth.   Cholecalciferol (VITAMIN D3) 125 MCG (5000 UT) CAPS Take 1 capsule (5,000 Units total) by mouth daily at 12 noon.   Cyanocobalamin (VITAMIN B-12 PO) Place 1 tablet under the tongue daily.   diclofenac Sodium (VOLTAREN) 1 % GEL Apply 2 g topically 4 (four) times daily for 7 days, THEN 2 g 4 (four) times daily as needed.   diltiazem (CARDIZEM SR) 120 MG 12 hr capsule TAKE 1 CAPSULE(120 MG) BY MOUTH TWICE DAILY   docusate sodium (COLACE) 100 MG capsule Take 2 capsules (200 mg total) by mouth 2 (two) times daily. (Patient taking differently: Take 200 mg by mouth as needed for moderate constipation.)   ELIQUIS 5 MG TABS tablet TAKE 1 TABLET(5 MG) BY MOUTH TWICE DAILY   Evolocumab (REPATHA SURECLICK) 140 MG/ML SOAJ INJECT THE CONTENTS OF 1 SYRINGE UNDER THE SKIN EVERY 14 DAYS   famotidine (PEPCID) 20 MG tablet TAKE 1 TABLET(20 MG) BY MOUTH TWICE DAILY   famotidine-calcium carbonate-magnesium hydroxide (PEPCID COMPLETE) 10-800-165 MG chewable tablet Chew 1 tablet by mouth 2 (two) times daily as needed.   fluticasone (FLONASE) 50 MCG/ACT nasal spray Place 2 sprays into both nostrils daily.   ketoconazole (NIZORAL) 2 % cream Apply 1 Application topically 2 (two) times daily.   methocarbamol (ROBAXIN) 500 MG tablet Take 1 tablet (500 mg total) by mouth every 8 (eight) hours as needed for muscle spasms (shoulder pain).   metoprolol tartrate (LOPRESSOR) 25 MG tablet Take 1 tablet (25 mg total) by mouth 2 (two) times daily.   montelukast (SINGULAIR) 10 MG tablet TAKE 1 TABLET(10 MG) BY MOUTH DAILY (Patient taking differently:  Take 10 mg by mouth daily.)   nitroGLYCERIN (NITROSTAT) 0.4 MG SL tablet Place 1 tablet (0.4 mg total) under the tongue every 5 (five) minutes as needed for chest pain.   ondansetron (ZOFRAN-ODT) 4 MG disintegrating tablet Take 1 tablet (4 mg total) by mouth every 8 (eight) hours as needed for nausea or vomiting.   pantoprazole (PROTONIX) 40 MG tablet Take 1 tablet (40 mg total) by mouth 2 (two) times daily before a meal.   polyethylene glycol (MIRALAX / GLYCOLAX) 17 g packet Mix 17 grams (1 packet) in 8 ounces of fluid and take by mouth daily. (Patient taking differently: Take 17 g by mouth daily as needed for moderate constipation.)   sucralfate (CARAFATE) 1 g tablet Take 1 tablet (1 g total) by mouth at bedtime.   tirzepatide [facility]) 2.5 MG/0.5ML Pen Inject 2.5 mg into the skin once a week.   torsemide (DEMADEX) 10 MG tablet Take by mouth.   triamcinolone (NASACORT) 55 MCG/ACT AERO nasal inhaler Place 1 spray into the nose daily. Start with 1 spray each side twice a day for 3 days, then reduce to daily.   Current Facility-Administered Medications on File Prior to Visit  Medication   technetium tetrofosmin (TC-MYOVIEW) injection 29.8 millicurie  There are no discontinued medications.          02/12/2024    1:01 PM 02/05/2024    8:03 AM 02/01/2024    2:37 PM  Vitals with BMI  Height 5\' 11"  5\' 11"  5\' 11"   Weight 375  lbs 375 lbs 3 oz 381 lbs 3 oz  BMI 52.33 52.35 53.19  Systolic 126 115 161  Diastolic 76 71 72  Pulse 55 56 65   Wt Readings from Last 10 Encounters:  02/12/24 (!) 375 lb (170.1 kg)  02/05/24 (!) 375 lb 3.2 oz (170.2 kg)  02/01/24 (!) 381 lb 3.2 oz (172.9 kg)  01/29/24 (!) 381 lb 4.8 oz (173 kg)  01/16/24 (!) 382 lb 0.9 oz (173.3 kg)  01/05/24 (!) 400 lb (181.4 kg)  01/01/24 (!) 400 lb 9.6 oz (181.7 kg)  12/14/23 (!) 387 lb 9.6 oz (175.8 kg)  12/01/23 (!) 391 lb (177.4 kg)  12/01/23 (!) 391 lb 6.4 oz (177.5 kg)   Vital signs reviewed.  Nursing notes reviewed.  Weight trend reviewed. Physical Exam  Physical Exam General Appearance:  No acute distress appreciable.   Well-groomed, healthy-appearing male.  Well proportioned with no abnormal fat distribution.  Good muscle tone. Pulmonary:  Normal work of breathing at rest, no respiratory distress apparent. SpO2: 99 %  Musculoskeletal: All extremities are intact.  Neurological:  Awake, alert, oriented, and engaged.  No obvious focal neurological deficits or cognitive impairments.  Sensorium seems unclouded.   Speech is clear and coherent with logical content. Psychiatric:  Appropriate mood, pleasant and cooperative demeanor, thoughtful and engaged during the exam}  Problem-Specific physical exam findings:  severe truncal adiposity, port subclavian on right looks clean , avf left arm      No results found for any visits on 02/12/24. Appointment on 01/18/2024  Component Date Value   WBC Count 01/18/2024 7.4    RBC 01/18/2024 3.33 (L)    Hemoglobin 01/18/2024 11.4 (L)    HCT 01/18/2024 33.4 (L)    MCV 01/18/2024 100.3 (H)    MCH 01/18/2024 34.2 (H)    MCHC 01/18/2024 34.1    RDW 01/18/2024 14.8    Platelet Count 01/18/2024 70 (L)    nRBC 01/18/2024 0.00    Neutrophils Relative % 01/18/2024 48    Neutro Abs 01/18/2024 3.6    Lymphocytes Relative 01/18/2024 26    Lymphs Abs 01/18/2024 1.9    Monocytes Relative 01/18/2024 16    Monocytes Absolute 01/18/2024 1.2 (H)    Eosinophils Relative 01/18/2024 8    Eosinophils Absolute 01/18/2024 0.6 (H)    Basophils Relative 01/18/2024 1    Basophils Absolute 01/18/2024 0.1    Immature Granulocytes 01/18/2024 1    Abs Immature Granulocytes 01/18/2024 0.07    nRBC 01/18/2024 0    Immature Platelet Fracti* 01/18/2024 0.3 (L)    Ferritin 01/18/2024 114    Iron 01/18/2024 152    TIBC 01/18/2024 456 (H)    Saturation Ratios 01/18/2024 33    UIBC 01/18/2024 304    Vitamin B-12 01/18/2024 6,881 (H)    IgG (Immunoglobin G), Se* 01/18/2024 561 (L)    IgA  01/18/2024 1,203 (H)    IgM (Immunoglobulin M), * 01/18/2024 91    Total Protein ELP 01/18/2024 5.9 (L)    Albumin SerPl Elph-Mcnc 01/18/2024 2.8 (L)    Alpha 1 01/18/2024 0.3    Alpha2 Glob SerPl Elph-M* 01/18/2024 0.6    B-Globulin SerPl Elph-Mc* 01/18/2024 1.7 (H)    Gamma Glob SerPl Elph-Mc* 01/18/2024 0.4    M Protein SerPl Elph-Mcnc 01/18/2024 0.9 (H)    Globulin, Total 01/18/2024 3.1    Albumin/Glob SerPl 01/18/2024 1.0    IFE 1 01/18/2024 Comment (A)    Please Note 01/18/2024 Comment    Kappa  free light chain 01/18/2024 1,099.9 (H)    Lambda free light chains 01/18/2024 33.2 (H)    Kappa, lambda light chai* 01/18/2024 33.13 (H)   No results displayed because visit has over 200 results.    Admission on 01/05/2024, Discharged on 01/05/2024  Component Date Value   WBC 01/05/2024 6.5    RBC 01/05/2024 3.17 (L)    Hemoglobin 01/05/2024 10.8 (L)    HCT 01/05/2024 32.8 (L)    MCV 01/05/2024 103.5 (H)    MCH 01/05/2024 34.1 (H)    MCHC 01/05/2024 32.9    RDW 01/05/2024 14.2    Platelets 01/05/2024 66 (L)    nRBC 01/05/2024 0.0    Neutrophils Relative % 01/05/2024 54    Neutro Abs 01/05/2024 3.6    Lymphocytes Relative 01/05/2024 22    Lymphs Abs 01/05/2024 1.4    Monocytes Relative 01/05/2024 12    Monocytes Absolute 01/05/2024 0.8    Eosinophils Relative 01/05/2024 10    Eosinophils Absolute 01/05/2024 0.6 (H)    Basophils Relative 01/05/2024 1    Basophils Absolute 01/05/2024 0.1    Immature Granulocytes 01/05/2024 1    Abs Immature Granulocytes 01/05/2024 0.05    Sodium 01/05/2024 142    Potassium 01/05/2024 3.9    Chloride 01/05/2024 107    CO2 01/05/2024 24    Glucose, Bld 01/05/2024 151 (H)    BUN 01/05/2024 57 (H)    Creatinine, Ser 01/05/2024 6.95 (H)    Calcium 01/05/2024 8.9    Total Protein 01/05/2024 5.9 (L)    Albumin 01/05/2024 2.4 (L)    AST 01/05/2024 27    ALT 01/05/2024 26    Alkaline Phosphatase 01/05/2024 140 (H)    Total Bilirubin 01/05/2024  0.7    GFR, Estimated 01/05/2024 9 (L)    Anion gap 01/05/2024 11    Magnesium 01/05/2024 2.4    B Natriuretic Peptide 01/05/2024 430.6 (H)    SARS Coronavirus 2 by RT* 01/05/2024 NEGATIVE    Influenza A by PCR 01/05/2024 NEGATIVE    Influenza B by PCR 01/05/2024 NEGATIVE    Resp Syncytial Virus by * 01/05/2024 NEGATIVE   Scanned Document on 12/28/2023  Component Date Value   PTH 12/23/2023 177    Calcium 12/23/2023 9.1    EGFR 12/23/2023 10.0   Office Visit on 12/14/2023  Component Date Value   WBC 12/14/2023 6.3    RBC 12/14/2023 3.69 (L)    Hemoglobin 12/14/2023 12.6 (L)    HCT 12/14/2023 37.0 (L)    MCV 12/14/2023 100.3 (H)    MCHC 12/14/2023 34.0    RDW 12/14/2023 14.3    Platelets 12/14/2023 70.0 (L)    Neutrophils Relative % 12/14/2023 67.3    Lymphocytes Relative 12/14/2023 16.1    Monocytes Relative 12/14/2023 11.9    Eosinophils Relative 12/14/2023 3.9    Basophils Relative 12/14/2023 0.8    Neutro Abs 12/14/2023 4.2    Lymphs Abs 12/14/2023 1.0    Monocytes Absolute 12/14/2023 0.8    Eosinophils Absolute 12/14/2023 0.2    Basophils Absolute 12/14/2023 0.0    Sodium 12/14/2023 143    Potassium 12/14/2023 3.5    Chloride 12/14/2023 107    CO2 12/14/2023 26    Glucose, Bld 12/14/2023 110 (H)    BUN 12/14/2023 45 (H)    Creatinine, Ser 12/14/2023 5.98 (HH)    Total Bilirubin 12/14/2023 0.9    Alkaline Phosphatase 12/14/2023 171 (H)    AST 12/14/2023 37    ALT 12/14/2023 39  Total Protein 12/14/2023 6.6    Albumin 12/14/2023 3.3 (L)    GFR 12/14/2023 10.23 (LL)    Calcium 12/14/2023 9.3    Color, Urine 12/14/2023 YELLOW    APPearance 12/14/2023 CLEAR    Specific Gravity, Urine 12/14/2023 1.020    pH 12/14/2023 6.5    Total Protein, Urine 12/14/2023 >=300 (A)    Urine Glucose 12/14/2023 NEGATIVE    Ketones, ur 12/14/2023 NEGATIVE    Bilirubin Urine 12/14/2023 NEGATIVE    Hgb urine dipstick 12/14/2023 SMALL (A)    Urobilinogen, UA 12/14/2023 0.2     Leukocytes,Ua 12/14/2023 NEGATIVE    Nitrite 12/14/2023 NEGATIVE    WBC, UA 12/14/2023 0-2/hpf    RBC / HPF 12/14/2023 3-6/hpf (A)    Squamous Epithelial / HPF 12/14/2023 Rare(0-4/hpf)   Office Visit on 12/01/2023  Component Date Value   WBC 12/01/2023 5.8    RBC 12/01/2023 3.63 (L)    Hemoglobin 12/01/2023 12.6 (L)    HCT 12/01/2023 36.1 (L)    MCV 12/01/2023 99.6    MCHC 12/01/2023 34.8    RDW 12/01/2023 14.9    Platelets 12/01/2023 67.0 (L)    Neutrophils Relative % 12/01/2023 56.2    Lymphocytes Relative 12/01/2023 23.7    Monocytes Relative 12/01/2023 13.0 (H)    Eosinophils Relative 12/01/2023 6.2 (H)    Basophils Relative 12/01/2023 0.9    Neutro Abs 12/01/2023 3.2    Lymphs Abs 12/01/2023 1.4    Monocytes Absolute 12/01/2023 0.8    Eosinophils Absolute 12/01/2023 0.4    Basophils Absolute 12/01/2023 0.0    Sodium 12/01/2023 140    Potassium 12/01/2023 3.5    Chloride 12/01/2023 104    CO2 12/01/2023 25    Glucose, Bld 12/01/2023 96    BUN 12/01/2023 50 (H)    Creatinine, Ser 12/01/2023 5.78 (HH)    Total Bilirubin 12/01/2023 0.8    Alkaline Phosphatase 12/01/2023 178 (H)    AST 12/01/2023 34    ALT 12/01/2023 44    Total Protein 12/01/2023 6.7    Albumin 12/01/2023 3.5    GFR 12/01/2023 10.66 (LL)    Calcium 12/01/2023 9.3    TSH W/REFLEX TO FT4 12/01/2023 3.83    Color, Urine 12/01/2023 YELLOW    APPearance 12/01/2023 CLEAR    Specific Gravity, Urine 12/01/2023 1.015    pH 12/01/2023 6.0    Total Protein, Urine 12/01/2023 100 (A)    Urine Glucose 12/01/2023 250 (A)    Ketones, ur 12/01/2023 NEGATIVE    Bilirubin Urine 12/01/2023 NEGATIVE    Hgb urine dipstick 12/01/2023 SMALL (A)    Urobilinogen, UA 12/01/2023 0.2    Leukocytes,Ua 12/01/2023 NEGATIVE    Nitrite 12/01/2023 NEGATIVE    WBC, UA 12/01/2023 0-2/hpf    RBC / HPF 12/01/2023 0-2/hpf    Squamous Epithelial / HPF 12/01/2023 Rare(0-4/hpf)    POC Glucose 12/01/2023 116 (A)   Office Visit on  11/09/2023  Component Date Value   WBC 11/09/2023 4.8    RBC 11/09/2023 3.42 (L)    Hemoglobin 11/09/2023 11.7 (L)    HCT 11/09/2023 34.3 (L)    MCV 11/09/2023 100.1 (H)    MCHC 11/09/2023 34.2    RDW 11/09/2023 15.3    Platelets 11/09/2023 66.0 (L)    Neutrophils Relative % 11/09/2023 56.8    Lymphocytes Relative 11/09/2023 25.4    Monocytes Relative 11/09/2023 11.2    Eosinophils Relative 11/09/2023 5.5 (H)    Basophils Relative 11/09/2023 1.1    Neutro Abs  11/09/2023 2.7    Lymphs Abs 11/09/2023 1.2    Monocytes Absolute 11/09/2023 0.5    Eosinophils Absolute 11/09/2023 0.3    Basophils Absolute 11/09/2023 0.1    Sodium 11/09/2023 141    Potassium 11/09/2023 3.9    Chloride 11/09/2023 107    CO2 11/09/2023 23    Glucose, Bld 11/09/2023 159 (H)    BUN 11/09/2023 66 (H)    Creatinine, Ser 11/09/2023 5.04 (HH)    Total Bilirubin 11/09/2023 0.7    Alkaline Phosphatase 11/09/2023 199 (H)    AST 11/09/2023 35    ALT 11/09/2023 45    Total Protein 11/09/2023 6.8    Albumin 11/09/2023 3.3 (L)    GFR 11/09/2023 12.57 (LL)    Calcium 11/09/2023 8.9    Creatinine, Urine 11/09/2023 42    Protein/Creat Ratio 11/09/2023 2,762 (H)    Protein/Creatinine Ratio 11/09/2023 2.762 (H)    Total Protein, Urine 11/09/2023 116 (H)    Color, Urine 11/09/2023 YELLOW    APPearance 11/09/2023 CLEAR    Specific Gravity, Urine 11/09/2023 1.015    pH 11/09/2023 6.0    Total Protein, Urine 11/09/2023 100 (A)    Urine Glucose 11/09/2023 >=1000 (A)    Ketones, ur 11/09/2023 NEGATIVE    Bilirubin Urine 11/09/2023 NEGATIVE    Hgb urine dipstick 11/09/2023 SMALL (A)    Urobilinogen, UA 11/09/2023 0.2    Leukocytes,Ua 11/09/2023 NEGATIVE    Nitrite 11/09/2023 NEGATIVE    WBC, UA 11/09/2023 0-2/hpf    RBC / HPF 11/09/2023 0-2/hpf    Mucus, UA 11/09/2023 Presence of (A)    Squamous Epithelial / HPF 11/09/2023 Rare(0-4/hpf)    Amorphous 11/09/2023 Present (A)   Admission on 11/02/2023, Discharged on  11/04/2023  Component Date Value   Sodium 11/02/2023 140    Potassium 11/02/2023 3.9    Chloride 11/02/2023 106    CO2 11/02/2023 25    Glucose, Bld 11/02/2023 170 (H)    BUN 11/02/2023 70 (H)    Creatinine, Ser 11/02/2023 5.63 (H)    Calcium 11/02/2023 9.1    Total Protein 11/02/2023 7.1    Albumin 11/02/2023 2.8 (L)    AST 11/02/2023 57 (H)    ALT 11/02/2023 71 (H)    Alkaline Phosphatase 11/02/2023 188 (H)    Total Bilirubin 11/02/2023 0.7    GFR, Estimated 11/02/2023 12 (L)    Anion gap 11/02/2023 9    WBC 11/02/2023 5.3    RBC 11/02/2023 3.49 (L)    Hemoglobin 11/02/2023 11.3 (L)    HCT 11/02/2023 35.8 (L)    MCV 11/02/2023 102.6 (H)    MCH 11/02/2023 32.4    MCHC 11/02/2023 31.6    RDW 11/02/2023 14.6    Platelets 11/02/2023 54 (L)    nRBC 11/02/2023 0.0    ABO/RH(D) 11/02/2023 A POS    Antibody Screen 11/02/2023 NEG    Sample Expiration 11/02/2023                     Value:11/05/2023,2359 Performed at [name], [address], [place], [place] [address]    Fecal Occult Bld 11/02/2023 NEGATIVE    Troponin I (High Sensiti* 11/02/2023 3    B Natriuretic Peptide 11/02/2023 25.4    Hemoglobin 11/02/2023 11.6 (L)    HCT 11/02/2023 35.9 (L)    Glucose-Capillary 11/02/2023 123 (H)    WBC 11/03/2023 5.1    RBC 11/03/2023 3.37 (L)    Hemoglobin 11/03/2023 11.3 (L)  HCT 11/03/2023 35.3 (L)    MCV 11/03/2023 104.7 (H)    MCH 11/03/2023 33.5    MCHC 11/03/2023 32.0    RDW 11/03/2023 14.8    Platelets 11/03/2023 56 (L)    nRBC 11/03/2023 0.0    Sodium 11/03/2023 143    Potassium 11/03/2023 3.7    Chloride 11/03/2023 111    CO2 11/03/2023 23    Glucose, Bld 11/03/2023 140 (H)    BUN 11/03/2023 65 (H)    Creatinine, Ser 11/03/2023 5.44 (H)    Calcium 11/03/2023 8.7 (L)    Total Protein 11/03/2023 6.3 (L)    Albumin 11/03/2023 2.6 (L)    AST 11/03/2023 53 (H)    ALT 11/03/2023 71 (H)    Alkaline Phosphatase 11/03/2023 185 (H)    Total Bilirubin  11/03/2023 0.6    GFR, Estimated 11/03/2023 12 (L)    Anion gap 11/03/2023 9    Magnesium 11/03/2023 2.3    Phosphorus 11/03/2023 4.9 (H)    Glucose-Capillary 11/03/2023 111 (H)    Glucose-Capillary 11/03/2023 98    Color, Urine 11/03/2023 YELLOW    APPearance 11/03/2023 CLEAR    Specific Gravity, Urine 11/03/2023 1.011    pH 11/03/2023 6.0    Glucose, UA 11/03/2023 >=500 (A)    Hgb urine dipstick 11/03/2023 NEGATIVE    Bilirubin Urine 11/03/2023 NEGATIVE    Ketones, ur 11/03/2023 NEGATIVE    Protein, ur 11/03/2023 100 (A)    Nitrite 11/03/2023 NEGATIVE    Leukocytes,Ua 11/03/2023 NEGATIVE    RBC / HPF 11/03/2023 0-5    WBC, UA 11/03/2023 0-5    Bacteria, UA 11/03/2023 NONE SEEN    Squamous Epithelial / HPF 11/03/2023 0-5    Glucose-Capillary 11/03/2023 105 (H)    WBC 11/04/2023 4.9    RBC 11/04/2023 3.15 (L)    Hemoglobin 11/04/2023 10.2 (L)    HCT 11/04/2023 31.6 (L)    MCV 11/04/2023 100.3 (H)    MCH 11/04/2023 32.4    MCHC 11/04/2023 32.3    RDW 11/04/2023 14.8    Platelets 11/04/2023 54 (L)    nRBC 11/04/2023 0.0    Magnesium 11/04/2023 2.2    Sodium 11/04/2023 141    Potassium 11/04/2023 4.0    Chloride 11/04/2023 111    CO2 11/04/2023 22    Glucose, Bld 11/04/2023 132 (H)    BUN 11/04/2023 62 (H)    Creatinine, Ser 11/04/2023 5.25 (H)    Calcium 11/04/2023 8.3 (L)    Phosphorus 11/04/2023 4.8 (H)    Albumin 11/04/2023 2.5 (L)    GFR, Estimated 11/04/2023 13 (L)    Anion gap 11/04/2023 8    Glucose-Capillary 11/03/2023 167 (H)    Glucose-Capillary 11/04/2023 153 (H)   Scanned Document on 11/02/2023  Component Date Value   EGFR 10/23/2023 16.0   Abstract on 07/27/2023  Component Date Value   Hemoglobin 07/27/2023 11.6 (A)    HCT 07/27/2023 34 (A)    Neutrophils Absolute 07/27/2023 2.41    Platelets 07/27/2023 53 (A)    WBC 07/27/2023 4.3    RBC 07/27/2023 3.44 (A)    Glucose 07/27/2023 101    BUN 07/27/2023 45 (A)    CO2 07/27/2023 25 (A)     Creatinine 07/27/2023 3.2 (A)    Potassium 07/27/2023 4.4    Sodium 07/27/2023 140    Chloride 07/27/2023 109 (A)    Calcium 07/27/2023 9.2    Albumin 07/27/2023 3.7    Alkaline Phosphatase 07/27/2023 192 (A)  ALT 07/27/2023 50 (A)    AST 07/27/2023 48 (A)    Bilirubin, Total 07/27/2023 1.4   There may be more visits with results that are not included.  No image results found. DG Shoulder Right Result Date: 01/14/2024 CLINICAL DATA:  Shoulder pain EXAM: RIGHT SHOULDER - 2+ VIEW COMPARISON:  None Available. FINDINGS: There is no evidence of fracture or dislocation. There is no evidence of arthropathy or other focal bone abnormality. Soft tissues are unremarkable. IMPRESSION: Negative. Electronically Signed   By: Jasmine Pang M.D.   On: 01/14/2024 00:10   DG CHEST PORT 1 VIEW Result Date: 01/11/2024 CLINICAL DATA:  Vascular dialysis catheter placement. EXAM: PORTABLE CHEST 1 VIEW COMPARISON:  01/08/2024 FINDINGS: Catheter placed from a right internal jugular approach. Tip is either at the SVC RA junction or at the proximal right atrium. No pneumothorax. IMPRESSION: Catheter placed from a right internal jugular approach. Tip is either at the SVC RA junction or at the proximal right atrium. No pneumothorax. Electronically Signed   By: Paulina Fusi M.D.   On: 01/11/2024 17:33   DG C-Arm 1-60 Min Result Date: 01/11/2024 CLINICAL DATA:  Tunneled dialysis catheter insertion EXAM: DG C-ARM 1-60 MIN COMPARISON:  None Available. FINDINGS: 2 fluoroscopic images are obtained during performance of the procedure and are provided for interpretation only. Right-sided dialysis catheter is identified via internal jugular approach, tip overlying atriocaval junction. Please refer to operative report. Fluoroscopy time: 4 minutes 17 seconds, 59.34 mGy IMPRESSION: 1. Intraoperative exam as above. Electronically Signed   By: Sharlet Salina M.D.   On: 01/11/2024 15:31   VAS [place] UPPER EXT VEIN MAPPING (PRE-OP  AVF) Result Date: 01/10/2024 UPPER EXTREMITY VEIN MAPPING Patient Name:  Sean [name]  Date of Exam:   01/09/2024 Medical Rec #: [id number]            Accession #:    [id number] Date of Birth: [birth year]            Patient Gender: M Patient Age:   84 years Exam Location:  [facility] Procedure:      VAS [place] UPPER EXT VEIN MAPPING (PRE-OP AVF) Referring Phys: JAY PATEL --------------------------------------------------------------------------------  Indications: Pre-access. History: CKD /ESRD.  Comparison Study: No prior study on file Performing Technologist: Sherren Kerns RVS  Examination Guidelines: A complete evaluation includes B-mode imaging, spectral Doppler, color Doppler, and power Doppler as needed of all accessible portions of each vessel. Bilateral testing is considered an integral part of a complete examination. Limited examinations for reoccurring indications may be performed as noted. +-----------------+-------------+----------+---------+ Right Cephalic   Diameter (cm)Depth (cm)Findings  +-----------------+-------------+----------+---------+ Prox upper arm       0.47        0.63             +-----------------+-------------+----------+---------+ Mid upper arm        0.47        0.41             +-----------------+-------------+----------+---------+ Dist upper arm       0.51        0.43             +-----------------+-------------+----------+---------+ Antecubital fossa    0.60        0.31             +-----------------+-------------+----------+---------+ Prox forearm       0.39/0.35  0.33/0.78 branching +-----------------+-------------+----------+---------+ Mid forearm          0.46  0.46             +-----------------+-------------+----------+---------+ Dist forearm         0.38        0.39             +-----------------+-------------+----------+---------+ Wrist                0.30        0.53              +-----------------+-------------+----------+---------+ +-----------------+-------------+----------+---------+ Right Basilic    Diameter (cm)Depth (cm)Findings  +-----------------+-------------+----------+---------+ Prox upper arm       0.65        2.11    origin   +-----------------+-------------+----------+---------+ Mid upper arm        0.52        1.18             +-----------------+-------------+----------+---------+ Dist upper arm       0.40        0.71             +-----------------+-------------+----------+---------+ Antecubital fossa    0.32        0.36             +-----------------+-------------+----------+---------+ Prox forearm         0.34        0.29             +-----------------+-------------+----------+---------+ Mid forearm        0.35/0.25  0.36/0.24 branching +-----------------+-------------+----------+---------+ Distal forearm       0.21        0.42             +-----------------+-------------+----------+---------+ Wrist                0.37        0.58             +-----------------+-------------+----------+---------+ +-----------------+-------------+----------+---------+ Left Cephalic    Diameter (cm)Depth (cm)Findings  +-----------------+-------------+----------+---------+ Prox upper arm       0.65        0.47             +-----------------+-------------+----------+---------+ Mid upper arm        0.48        0.55             +-----------------+-------------+----------+---------+ Dist upper arm       0.63        0.48             +-----------------+-------------+----------+---------+ Antecubital fossa    0.79        0.45             +-----------------+-------------+----------+---------+ Prox forearm       0.49/0.45  0.70/0.62 branching +-----------------+-------------+----------+---------+ Mid forearm          0.42        0.46             +-----------------+-------------+----------+---------+ Dist forearm          0.38        0.46             +-----------------+-------------+----------+---------+ Wrist                0.37        0.58             +-----------------+-------------+----------+---------+ +-----------------+-------------+----------+--------+ Left Basilic     Diameter (cm)Depth (cm)Findings +-----------------+-------------+----------+--------+ Prox upper arm       0.54  0.17    origin  +-----------------+-------------+----------+--------+ Mid upper arm        0.76        0.98            +-----------------+-------------+----------+--------+ Dist upper arm       0.42        0.97            +-----------------+-------------+----------+--------+ Antecubital fossa    0.57        1.22            +-----------------+-------------+----------+--------+ Prox forearm         0.40        0.55            +-----------------+-------------+----------+--------+ Mid forearm          0.21        0.40            +-----------------+-------------+----------+--------+ Distal forearm       0.15        0.25            +-----------------+-------------+----------+--------+ Wrist                0.17        0.38            +-----------------+-------------+----------+--------+ *See table(s) above for measurements and observations.  Diagnosing physician: Gerarda Fraction Electronically signed by Gerarda Fraction on 01/10/2024 at 10:26:10 AM.    Final    US RENAL Result Date: 01/09/2024 CLINICAL DATA:  Acute kidney injury EXAM: RENAL / URINARY TRACT ULTRASOUND COMPLETE COMPARISON:  CT abdomen pelvis 11/02/2023 FINDINGS: Right Kidney: Not well visualized due to adjacent shadowing bowel gas. Left Kidney: Not well visualized due to adjacent shadowing bowel gas. Bladder: Appears normal for degree of bladder distention. Other: None. IMPRESSION: Nonvisualization of the kidneys due to patient body habitus and adjacent shadowing bowel gas. Electronically Signed   By: Acquanetta Belling M.D.   On: 01/09/2024  12:44   DG Chest Port 1 View Result Date: 01/08/2024 CLINICAL DATA:  Shortness of breath EXAM: PORTABLE CHEST 1 VIEW COMPARISON:  Chest x-ray 01/05/2024 FINDINGS: The cardiac silhouette is enlarged, unchanged. Sternotomy wires are present. There is no focal lung infiltrate, pleural effusion or pneumothorax. There stable elevation of the left hemidiaphragm. No acute fractures are seen. IMPRESSION: 1. No active disease. 2. Stable cardiomegaly. Electronically Signed   By: Darliss Cheney M.D.   On: 01/08/2024 19:12   DG Chest 2 View Result Date: 01/05/2024 CLINICAL DATA:  Shortness of breath EXAM: CHEST - 2 VIEW COMPARISON:  Chest radiograph dated 11/09/2023 FINDINGS: Unchanged asymmetric elevation of the left hemidiaphragm. Normal lung volumes. Right basilar patchy opacity. No pleural effusion or pneumothorax. Similar enlarged cardiomediastinal silhouette. Median sternotomy wires are nondisplaced. IMPRESSION: 1. Right basilar patchy opacity, likely atelectasis. 2. Similar cardiomegaly. Electronically Signed   By: Agustin Cree M.D.   On: 01/05/2024 15:18   US Abdomen Limited RUQ (LIVER/GB) Result Date: 12/24/2023 CLINICAL DATA:  Elita Boone EXAM: ULTRASOUND ABDOMEN LIMITED RIGHT UPPER QUADRANT COMPARISON:  CT abdomen pelvis 11/02/2023 FINDINGS: Gallbladder: Surgically absent Common bile duct: Diameter: 4 mm Liver: Increased echogenicity. No focal lesion. Portal vein is patent on color Doppler imaging with normal direction of blood flow towards the liver. Other: None. IMPRESSION: 1. Increased hepatic parenchymal echogenicity suggestive of steatosis. 2. Status post cholecystectomy. Electronically Signed   By: Annia Belt M.D.   On: 12/24/2023 12:47          Assessment &  Plan ESRD (end stage renal disease) (HCC) End Stage Renal Disease (ESRD)   He has ESRD requiring dialysis since January 08, 2024, causing recurrent nausea, vomiting, and dyspnea due to fluid overload. 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 The cause of renal failure is unclear but may be linked to past dehydration and weight issues. He is considering a kidney transplant in six months once stabilized. Write letters to [employer] and Medicare for disability and insurance coverage. Refer to a Child psychotherapist for assistance with disability and Medicare benefits application. Discuss potential kidney transplant with a nephrologist in six months. Consider restarting weight loss medication at a low dose if nausea is controlled.   Disability affecting daily living  Transportation Assistance   He needs transportation assistance for dialysis appointments. Family members currently help, but he seeks additional options due to post-dialysis weakness affecting his ability to drive. Refer to a Child psychotherapist to explore transportation assistance options such as free bus passes.  Financial Assistance   He seeks financial assistance due to inability to work and dialysis costs. 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 He is advised to reduce income and assets to qualify for Medicaid. Refer to a Child psychotherapist to assist with financial planning and Medicaid application. 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  Follow-up   He requires follow-up to ensure processing of disability and Medicare applications and to monitor management plan effectiveness. Schedule a follow-up appointment in one month to review progress on disability and Medicare applications and ensure no issues with dialysis management.       Orders Placed During this Encounter:   Orders Placed This Encounter  Procedures   AMB Referral VBCI Care Management    Referral Priority:   Routine    Referral Type:   Consultation    Referral Reason:   Care Coordination    Number of Visits Requested:   1   No orders of  the defined types were placed in this encounter.    TIME-BASED BILLING ATTESTATION  I spent a total of 32 minutes providing care for this patient during today's encounter. This time included:  - Reviewing the patient's complex medical history and recent hospitalization records - Performing a focused examination - Assessing multiple active medical conditions including post-appendectomy status, iron deficiency anemia, atherosclerotic cardiovascular disease, acute cystitis, diabetes mellitus, and post-traumatic stress disorder - Developing comprehensive management plans for each condition - making necessary referrals - Medication management including initiating new prescriptions and adjusting dosages - Patient education regarding diagnosis, treatment options, and self-care strategies - Preparing two medical letters: one for Social Security Disability Insurance application and one supporting early Medicare eligibility  The extended time spent was medically necessary due to: 1. The complexity of the patient's medical conditions 2. Recent hospitalization for ruptured appendix requiring careful post-discharge management 3. Multiple medication adjustments requiring detailed counseling 4. Need for coordination of care across multiple specialties 5. Documentation required for disability and Medicare applications  This encounter required significant medical decision-making to address the patient's interconnected health issues and to provide comprehensive care management during this transitional period following hospitalization.  Lula Olszewski, MD  02/13/2024 3:37 PM   [facility]:  [phone]          **This document was synthesized by artificial intelligence (Abridge) using HIPAA-compliant  recording of the clinical interaction;   We discussed the use of AI scribe software for clinical note transcription with the patient, who gave verbal consent to  proceed.    Additional Info: This encounter employed state-of-the-art, real-time, collaborative documentation. The patient actively reviewed and assisted in updating their electronic medical record on a shared screen, ensuring transparency and facilitating joint problem-solving for the problem list, overview, and plan. This approach promotes accurate, informed care. The treatment plan was discussed and reviewed in detail, including medication safety, potential side effects, and all patient questions. We confirmed understanding and comfort with the plan. Follow-up instructions were established, including contacting the office for any concerns, returning if symptoms worsen, persist, or new symptoms develop, and precautions for potential emergency department visits.

## 2024-02-16 ENCOUNTER — Telehealth: Payer: Self-pay | Admitting: *Deleted

## 2024-02-16 DIAGNOSIS — Z992 Dependence on renal dialysis: Secondary | ICD-10-CM | POA: Diagnosis not present

## 2024-02-16 DIAGNOSIS — N186 End stage renal disease: Secondary | ICD-10-CM | POA: Diagnosis not present

## 2024-02-16 DIAGNOSIS — N2581 Secondary hyperparathyroidism of renal origin: Secondary | ICD-10-CM | POA: Diagnosis not present

## 2024-02-16 NOTE — Progress Notes (Unsigned)
 Complex Care Management Note Care Guide Note  02/16/2024 Name: Sean [name] MRN: [id number] DOB: 1971-11-20   Complex Care Management Outreach Attempts: An unsuccessful telephone outreach was attempted today to offer the patient information about available complex care management services.  Follow Up Plan:  Additional outreach attempts will be made to offer the patient complex care management information and services.   Encounter Outcome:  No Answer  Burman Nieves, CMA, Care Guide [facility], [facility]  [facility], [facility] Guide Direct Dial: [phone]  Fax: [phone] Website: [email or web address]

## 2024-02-18 DIAGNOSIS — N2581 Secondary hyperparathyroidism of renal origin: Secondary | ICD-10-CM | POA: Diagnosis not present

## 2024-02-18 DIAGNOSIS — N186 End stage renal disease: Secondary | ICD-10-CM | POA: Diagnosis not present

## 2024-02-18 DIAGNOSIS — Z992 Dependence on renal dialysis: Secondary | ICD-10-CM | POA: Diagnosis not present

## 2024-02-18 NOTE — Progress Notes (Signed)
 Complex Care Management Note  Care Guide Note 02/18/2024 Name: Jamesrobert Italy Poblano MRN: [id number] DOB: [birth year]  Jathniel Italy Conaway is a 53 y.o. year old male who sees Lula Olszewski, MD for primary care. I reached out to Koi Italy Moulder by phone today to offer complex care management services.  Mr. Flaum was given information about Complex Care Management services today including:   The Complex Care Management services include support from the care team which includes your Nurse Care Manager, Clinical Social Worker, or Pharmacist.  The Complex Care Management team is here to help remove barriers to the health concerns and goals most important to you. Complex Care Management services are voluntary, and the patient may decline or stop services at any time by request to their care team member.   Complex Care Management Consent Status: Patient agreed to services and verbal consent obtained.   Follow up plan:  Telephone appointment with complex care management team member scheduled for:  02/22/2024  Encounter Outcome:  Patient Scheduled  Burman Nieves, CMA, Care Guide [facility]  [facility], [facility] Guide Direct Dial: [phone]  Fax: [phone] Website: [email or web address]

## 2024-02-20 DIAGNOSIS — N186 End stage renal disease: Secondary | ICD-10-CM | POA: Diagnosis not present

## 2024-02-20 DIAGNOSIS — Z992 Dependence on renal dialysis: Secondary | ICD-10-CM | POA: Diagnosis not present

## 2024-02-20 DIAGNOSIS — N2581 Secondary hyperparathyroidism of renal origin: Secondary | ICD-10-CM | POA: Diagnosis not present

## 2024-02-22 ENCOUNTER — Ambulatory Visit: Payer: Self-pay

## 2024-02-22 ENCOUNTER — Other Ambulatory Visit: Payer: Self-pay | Admitting: Internal Medicine

## 2024-02-22 DIAGNOSIS — I2510 Atherosclerotic heart disease of native coronary artery without angina pectoris: Secondary | ICD-10-CM

## 2024-02-22 DIAGNOSIS — E7849 Other hyperlipidemia: Secondary | ICD-10-CM

## 2024-02-22 NOTE — Patient Instructions (Signed)
 Visit Information  Thank you for taking time to visit with me today. Please don't hesitate to contact me if I can be of assistance to you.   Following are the goals we discussed today:  Patient will apply online for medicaid and foodstamps. Patient will contact [facility] for assistance with applying for disability.   Our next appointment is by telephone on 02/29/24 at 1pm  Please call the care guide team at [phone] if you need to cancel or reschedule your appointment.   If you are experiencing a Mental Health or Behavioral Health Crisis or need someone to talk to, please call 911  Patient verbalizes understanding of instructions and care plan provided today and agrees to view in MyChart. Active MyChart status and patient understanding of how to access instructions and care plan via MyChart confirmed with patient.     Telephone follow up appointment with care management team member scheduled for:02/29/24 at 1pm.  Lysle Morales, BSW [place]  [facility], [facility] Social Worker Direct Dial: [phone]  Fax: [phone] Website: [email or web address]

## 2024-02-22 NOTE — Patient Outreach (Signed)
 Care Coordination   Initial Visit Note   02/22/2024 Name: Sean [name] MRN: [id number] DOB: 08-Mar-1971  Sean [name] is a 53 y.o. year old male who sees Lula Olszewski, MD for primary care. I spoke with  Sean [name] by phone today.  What matters to the patients health and wellness today?  Patient lives with parents and is no longer working. 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    Goals Addressed             This Visit's Progress    Care Coordination Activities       Interventions Today    Flowsheet Row Most Recent Value  Chronic Disease   Chronic disease during today's visit Hypertension (HTN), Atrial Fibrillation (AFib), Chronic Kidney Disease/End Stage Renal Disease (ESRD)  General Interventions   General Interventions Discussed/Reviewed General Interventions Discussed, General Interventions Reviewed, [employer] can't work due to dialysis.Pt self employed truck Hospital doctor.SW provided link to apply for medicaid/foodstamps and [facility] to assist w/disability application.[employer] insurance is $260.Transportation by family.]  Education Interventions   Education Provided Provided Education  [SW educated patient on Medicaid transporation for medical visits. This will assist as multiple family members are coordinating transportation for dialysis 3 days a week.]  Provided Verbal Education On Applications  [SW educated pt the disability application can be very long.]              SDOH assessments and interventions completed:  Yes  SDOH Interventions Today    Flowsheet Row Most Recent Value  SDOH Interventions   Food Insecurity Interventions Intervention Not Indicated  [Will apply for foodstamps and eat with family]  Housing Interventions Intervention Not Indicated  [Lives with parents for several years]  Transportation Interventions Intervention Not Indicated  [Family  assist with Transportation, will apply for medicaid for transportation]  Utilities Interventions Intervention Not Indicated        Care Coordination Interventions:  Yes, provided   Follow up plan: Follow up call scheduled for 02/29/24 at 1pm    Encounter Outcome:  Patient Visit Completed

## 2024-02-23 DIAGNOSIS — N2581 Secondary hyperparathyroidism of renal origin: Secondary | ICD-10-CM | POA: Diagnosis not present

## 2024-02-23 DIAGNOSIS — N186 End stage renal disease: Secondary | ICD-10-CM | POA: Diagnosis not present

## 2024-02-23 DIAGNOSIS — Z992 Dependence on renal dialysis: Secondary | ICD-10-CM | POA: Diagnosis not present

## 2024-02-25 ENCOUNTER — Encounter (HOSPITAL_COMMUNITY): Payer: BC Managed Care – PPO

## 2024-02-25 ENCOUNTER — Other Ambulatory Visit: Payer: Self-pay | Admitting: Internal Medicine

## 2024-02-25 DIAGNOSIS — I4892 Unspecified atrial flutter: Secondary | ICD-10-CM

## 2024-02-25 DIAGNOSIS — Z992 Dependence on renal dialysis: Secondary | ICD-10-CM | POA: Diagnosis not present

## 2024-02-25 DIAGNOSIS — N186 End stage renal disease: Secondary | ICD-10-CM | POA: Diagnosis not present

## 2024-02-25 DIAGNOSIS — N2581 Secondary hyperparathyroidism of renal origin: Secondary | ICD-10-CM | POA: Diagnosis not present

## 2024-02-25 NOTE — Telephone Encounter (Signed)
 Copied from CRM [id number]. Topic: Clinical - Medication Refill >> Feb 25, 2024  9:02 AM Elizebeth Brooking wrote: Most Recent Primary Care Visit:  Provider: Lula Olszewski  Department: [facility]  Visit Type: OFFICE VISIT  Date: 02/12/2024  Medication: diltiazem (CARDIZEM SR) 120 MG 12 hr capsule  Has the patient contacted their pharmacy? Yes (Agent: If no, request that the patient contact the pharmacy for the refill. If patient does not wish to contact the pharmacy document the reason why and proceed with request.) (Agent: If yes, when and what did the pharmacy advise?)  Is this the correct pharmacy for this prescription? Yes If no, delete pharmacy and type the correct one.  This is the patient's preferred pharmacy:  [id number] [place] - [address] [place] [place] [address] Phone: [phone] Fax: [phone]  [facility] [address] [place] [address] Phone: [phone] Fax: [phone]  [facility] [address] [place] [address] Phone: [phone] Fax: [phone]   Has the prescription been filled recently? No  Is the patient out of the medication? Yes  Has the patient been seen for an appointment in the last year OR does the patient have an upcoming appointment? Yes  Can we respond through MyChart? Yes  Agent: Please be advised that Rx refills may take up to 3 business days. We ask that you follow-up with your pharmacy.

## 2024-02-27 DIAGNOSIS — N186 End stage renal disease: Secondary | ICD-10-CM | POA: Diagnosis not present

## 2024-02-27 DIAGNOSIS — N2581 Secondary hyperparathyroidism of renal origin: Secondary | ICD-10-CM | POA: Diagnosis not present

## 2024-02-27 DIAGNOSIS — Z992 Dependence on renal dialysis: Secondary | ICD-10-CM | POA: Diagnosis not present

## 2024-02-29 ENCOUNTER — Ambulatory Visit: Payer: Self-pay

## 2024-02-29 DIAGNOSIS — N186 End stage renal disease: Secondary | ICD-10-CM | POA: Diagnosis not present

## 2024-02-29 DIAGNOSIS — I509 Heart failure, unspecified: Secondary | ICD-10-CM | POA: Diagnosis not present

## 2024-02-29 DIAGNOSIS — Z992 Dependence on renal dialysis: Secondary | ICD-10-CM | POA: Diagnosis not present

## 2024-02-29 NOTE — Patient Outreach (Signed)
 Care Coordination   Follow Up Visit Note   02/29/2024 Name: Sean [name] MRN: [id number] DOB: 11/13/1971  Sean [name] is a 53 y.o. year old male who sees Lula Olszewski, MD for primary care. I spoke with  Sean [name] by phone today.  What matters to the patients health and wellness today?  Patient needs food, insurance and assistance with disability.    Goals Addressed             This Visit's Progress    Care Coordination Activities       Interventions Today    Flowsheet Row Most Recent Value  Chronic Disease   Chronic disease during today's visit Chronic Kidney Disease/End Stage Renal Disease (ESRD), Hypertension (HTN)  General Interventions   General Interventions Discussed/Reviewed General Interventions Discussed, General Interventions Reviewed, [employer] reports he did not receive information from SW.Pt discovers he received the information and will apply for Medicaid/Foodstamps online.Pt will contact Disability Advocacy to connect to services.Pt has family to assist with online application.]              SDOH assessments and interventions completed:  No     Care Coordination Interventions:  Yes, provided   Follow up plan: Follow up call scheduled for 03/11/24 at 1pm.    Encounter Outcome:  Patient Visit Completed

## 2024-02-29 NOTE — Patient Instructions (Signed)
 Visit Information  Thank you for taking time to visit with me today. Please don't hesitate to contact me if I can be of assistance to you.   Following are the goals we discussed today:  Patient will apply for Medicaid and Foodstamps. Patent will contact [employer] [facility] to apply for disability.   Our next appointment is by telephone on 03/11/24 at 1pm.  Please call the care guide team at [phone] if you need to cancel or reschedule your appointment.   If you are experiencing a Mental Health or Behavioral Health Crisis or need someone to talk to, please call 911  Patient verbalizes understanding of instructions and care plan provided today and agrees to view in MyChart. Active MyChart status and patient understanding of how to access instructions and care plan via MyChart confirmed with patient.     Telephone follow up appointment with care management team member scheduled for:03/11/24 at 1pm.   Lysle Morales, BSW [place]  [facility], [facility] Social Worker Direct Dial: [phone]  Fax: [phone] Website: [email or web address]

## 2024-03-01 DIAGNOSIS — N2581 Secondary hyperparathyroidism of renal origin: Secondary | ICD-10-CM | POA: Diagnosis not present

## 2024-03-01 DIAGNOSIS — R52 Pain, unspecified: Secondary | ICD-10-CM | POA: Diagnosis not present

## 2024-03-01 DIAGNOSIS — Z992 Dependence on renal dialysis: Secondary | ICD-10-CM | POA: Diagnosis not present

## 2024-03-01 DIAGNOSIS — N186 End stage renal disease: Secondary | ICD-10-CM | POA: Diagnosis not present

## 2024-03-03 DIAGNOSIS — N186 End stage renal disease: Secondary | ICD-10-CM | POA: Diagnosis not present

## 2024-03-03 DIAGNOSIS — R52 Pain, unspecified: Secondary | ICD-10-CM | POA: Diagnosis not present

## 2024-03-03 DIAGNOSIS — Z992 Dependence on renal dialysis: Secondary | ICD-10-CM | POA: Diagnosis not present

## 2024-03-03 DIAGNOSIS — N2581 Secondary hyperparathyroidism of renal origin: Secondary | ICD-10-CM | POA: Diagnosis not present

## 2024-03-04 DIAGNOSIS — Z992 Dependence on renal dialysis: Secondary | ICD-10-CM | POA: Diagnosis not present

## 2024-03-04 DIAGNOSIS — R52 Pain, unspecified: Secondary | ICD-10-CM | POA: Diagnosis not present

## 2024-03-04 DIAGNOSIS — N186 End stage renal disease: Secondary | ICD-10-CM | POA: Diagnosis not present

## 2024-03-04 DIAGNOSIS — N2581 Secondary hyperparathyroidism of renal origin: Secondary | ICD-10-CM | POA: Diagnosis not present

## 2024-03-08 DIAGNOSIS — Z992 Dependence on renal dialysis: Secondary | ICD-10-CM | POA: Diagnosis not present

## 2024-03-08 DIAGNOSIS — R52 Pain, unspecified: Secondary | ICD-10-CM | POA: Diagnosis not present

## 2024-03-08 DIAGNOSIS — N186 End stage renal disease: Secondary | ICD-10-CM | POA: Diagnosis not present

## 2024-03-08 DIAGNOSIS — N2581 Secondary hyperparathyroidism of renal origin: Secondary | ICD-10-CM | POA: Diagnosis not present

## 2024-03-09 ENCOUNTER — Ambulatory Visit: Payer: Self-pay

## 2024-03-09 NOTE — Patient Outreach (Signed)
 Complex Care Management   Visit Note  03/09/2024  Name:  Sean [name] MRN: [id number] DOB: 1971-05-28  Situation: Referral received for Complex Care Management related to Chronic Kidney Disease and Atrial Fibrillation I obtained verbal consent from patient.  Visit completed with patient and relative on the phone  Background:   Past Medical History:  Diagnosis Date   Abnormal liver enzymes    a. Sees a doctor in [place].   Cardiac cirrhosis    a. possible elevated LFTs/low platelets felt due to cardiac cirrhosis per 2017 admission.   Chronic combined systolic and diastolic CHF (congestive heart failure) (HCC)    CKD (chronic kidney disease) stage 3, GFR 30-59 ml/min (HCC) 01/04/2016   Lab Results  Component  Value  Date/Time     GFR  29.24 (L)  09/30/2022 02:51 PM     GFR  27.27 (L)  09/24/2022 03:40 PM     GFR  24.10 (L)  09/03/2022 03:45 PM     GFR  18.98 (L)  08/25/2022 12:21 PM     GFR  38.98 (L)  04/24/2022 02:39 PM         CKD (chronic kidney disease), stage II    Stage 4   Congenital heart defect    a. rightward rotation of heart, almost dextrocardia   Coronary artery disease    a. s/p CABGx1 in 12/2015.   Gastric polyps 11/25/2022   Gastritis and gastroduodenitis 11/25/2022          Gastroesophageal reflux disease with esophagitis and hemorrhage 04/04/2021   Hematuria    a. Chronic hx of this, no prior etiology determined through workup per patient.   History of umbilical hernia repair 07/10/2011   History of hernia surgery twice prior   Hypercholesteremia    a. Prev taken off statin due to abnormal liver function.   Hypertension    Incisional hernia 07/10/2011   History of hernia surgery twice prior   Morbid obesity (HCC)    Morbid obesity with BMI of 50.0-59.9, adult (HCC) 01/04/2016   After [place] [facility] doctor is setting him up with a wellness clinic to help with weight losS  He reports not having tried anything for weight loss either diet or or medicine or  surgery in the past  We failed to get [facility] 10/2022      Wt Readings from Last 10 Encounters:  11/13/22  (!) 384 lb 12.8 oz (174.5 kg)  10/13/22  (!) 385 lb (174.6 kg)  09/30/22  (!) 384 lb (174.2 kg)  09/24/22  (!) 382 lb (1   Nausea and vomiting 11/25/2022   OSA on CPAP    Paroxysmal atrial flutter (HCC) 02/12/2016   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  Medications Reviewed Today   Medications were not reviewed in this encounter     Recommendation:   Patient to work with family to complete online application or contact DSS to apply if  needed.  Follow Up Plan:   Telephone follow up appointment date/time:  03/11/24 at 1pm  Lysle Morales, BSW [place]  [facility], [facility] Social Worker Direct Dial: [phone]  Fax: [phone] Website: [email or web address]

## 2024-03-09 NOTE — Patient Instructions (Signed)
 Visit Information  Thank you for taking time to visit with me today. Please don't hesitate to contact me if I can be of assistance to you.   Following are the goals we discussed today:   Goals Addressed             This Visit's Progress    [facility] Social Work Care Plan       Problems:   Foodstamps/Medicaid  CSW Clinical Goal(s):   Over the next 2 days the Patient will  Complete foodstamps/medicaid application .  Interventions:  Social Determinants of Health in Patient with Atrial Fibrillation and HTN: SDOH assessments completed: Financial Strain , Food Insecurity , and Medical needs Evaluation of current treatment plan related to unmet needs Referral to Department of Social Services apply for foodstamps and medicaid  Patient Goals/Self-Care Activities:  Call Department of Social Services [phone] to apply for Foodstamps and Medicaid.  Plan:   The care management team will reach out to the patient again over the next 03/11/24  days.        Our next appointment is by telephone on 03/11/24 at 1pm  Please call the care guide team at [phone] if you need to cancel or reschedule your appointment.   If you are experiencing a Mental Health or Behavioral Health Crisis or need someone to talk to, please call 911  Patient verbalizes understanding of instructions and care plan provided today and agrees to view in MyChart. Active MyChart status and patient understanding of how to access instructions and care plan via MyChart confirmed with patient.     Telephone follow up appointment with care management team member scheduled for:03/11/24 at 1pm   Lysle Morales, BSW [place]  [facility] Social Worker Direct Dial: [phone]  Fax: [phone] Website: [email or web address]

## 2024-03-10 ENCOUNTER — Other Ambulatory Visit: Payer: Self-pay | Admitting: *Deleted

## 2024-03-10 DIAGNOSIS — N186 End stage renal disease: Secondary | ICD-10-CM

## 2024-03-10 DIAGNOSIS — R52 Pain, unspecified: Secondary | ICD-10-CM | POA: Diagnosis not present

## 2024-03-10 DIAGNOSIS — N2581 Secondary hyperparathyroidism of renal origin: Secondary | ICD-10-CM | POA: Diagnosis not present

## 2024-03-10 DIAGNOSIS — Z992 Dependence on renal dialysis: Secondary | ICD-10-CM | POA: Diagnosis not present

## 2024-03-12 DIAGNOSIS — R52 Pain, unspecified: Secondary | ICD-10-CM | POA: Diagnosis not present

## 2024-03-12 DIAGNOSIS — N186 End stage renal disease: Secondary | ICD-10-CM | POA: Diagnosis not present

## 2024-03-12 DIAGNOSIS — N2581 Secondary hyperparathyroidism of renal origin: Secondary | ICD-10-CM | POA: Diagnosis not present

## 2024-03-12 DIAGNOSIS — Z992 Dependence on renal dialysis: Secondary | ICD-10-CM | POA: Diagnosis not present

## 2024-03-14 ENCOUNTER — Encounter: Payer: Self-pay | Admitting: Internal Medicine

## 2024-03-14 ENCOUNTER — Ambulatory Visit (INDEPENDENT_AMBULATORY_CARE_PROVIDER_SITE_OTHER): Admitting: Internal Medicine

## 2024-03-14 VITALS — BP 110/60 | HR 76 | Temp 98.0°F | Ht 71.0 in | Wt 377.8 lb

## 2024-03-14 DIAGNOSIS — J301 Allergic rhinitis due to pollen: Secondary | ICD-10-CM

## 2024-03-14 DIAGNOSIS — N186 End stage renal disease: Secondary | ICD-10-CM | POA: Diagnosis not present

## 2024-03-14 DIAGNOSIS — I10 Essential (primary) hypertension: Secondary | ICD-10-CM

## 2024-03-14 DIAGNOSIS — E782 Mixed hyperlipidemia: Secondary | ICD-10-CM | POA: Diagnosis not present

## 2024-03-14 DIAGNOSIS — R7303 Prediabetes: Secondary | ICD-10-CM

## 2024-03-14 DIAGNOSIS — E039 Hypothyroidism, unspecified: Secondary | ICD-10-CM

## 2024-03-14 DIAGNOSIS — D472 Monoclonal gammopathy: Secondary | ICD-10-CM

## 2024-03-14 DIAGNOSIS — G4733 Obstructive sleep apnea (adult) (pediatric): Secondary | ICD-10-CM

## 2024-03-14 DIAGNOSIS — L0231 Cutaneous abscess of buttock: Secondary | ICD-10-CM

## 2024-03-14 DIAGNOSIS — M1A9XX0 Chronic gout, unspecified, without tophus (tophi): Secondary | ICD-10-CM

## 2024-03-14 DIAGNOSIS — K7581 Nonalcoholic steatohepatitis (NASH): Secondary | ICD-10-CM

## 2024-03-14 DIAGNOSIS — Z992 Dependence on renal dialysis: Secondary | ICD-10-CM | POA: Diagnosis not present

## 2024-03-14 DIAGNOSIS — R112 Nausea with vomiting, unspecified: Secondary | ICD-10-CM | POA: Diagnosis not present

## 2024-03-14 DIAGNOSIS — Z6841 Body Mass Index (BMI) 40.0 and over, adult: Secondary | ICD-10-CM

## 2024-03-14 DIAGNOSIS — R7989 Other specified abnormal findings of blood chemistry: Secondary | ICD-10-CM

## 2024-03-14 DIAGNOSIS — K648 Other hemorrhoids: Secondary | ICD-10-CM

## 2024-03-14 DIAGNOSIS — K7210 Chronic hepatic failure without coma: Secondary | ICD-10-CM

## 2024-03-14 MED ORDER — CLINDAMYCIN HCL 150 MG PO CAPS: 450.0000 mg | ORAL_CAPSULE | Freq: Three times a day (TID) | ORAL | 0 refills | Status: AC

## 2024-03-14 NOTE — Patient Instructions (Signed)
??   Top Concerns to Consider (and Rule Out): Dialysate-related issues Rapid fluid shifts or electrolyte imbalances during dialysis. Low post-dialysis sodium, calcium, or hypotension can cause nausea. Uremia or underdialysis Nausea can result from insufficient toxin clearance. Check last Kt/V or URR if available. Gastroparesis or reflux Common in diabetes and ESRD. History of GERD, gastritis, and PUD present. Medication-related Amitriptyline and calcium acetate can also slow gastric emptying. Consider simplifying GI meds--he's on sucralfate, Pepcid Complete, Miralax, ondansetron, etc.--to avoid drug burden. Volume overload or heart-related nausea He has CHF, atrial flutter, LV dilation, and prior CABG. Consider echocardiogram if signs of fluid overload or hypotension.  ? Interim Plan While Awaiting Nephrology ?? Workup: BMP post-dialysis (check Na, Ca, K, BUN) Magnesium, phosphorus, and bicarbonate levels CBC with diff (he has thrombocytopenia) TSH (with known hypothyroidism) Consider stool guaiac if concern for bleeding ?? Medication Adjustments: Temporarily hold tirzepatide Florence Hunt) - common cause of nausea, especially with ESRD Ensure ondansetron 4-8 mg ODT before and after dialysis If not contraindicated, consider a trial of prokinetic like metoclopramide 5-10 mg TID AC, especially with suspected gastroparesis Discontinue or space out sucralfate and Pepcid Complete if reflux/constipation are worsening nausea ?? Patient Education Points: Eat a light, low-fat meal several hours before dialysis Avoid skipping meals--empty stomach worsens nausea Drink small sips of cold water or clear liquids post-dialysis if tolerable Use ginger tea, peppermint, or acupuncture bands for non-pharma support Keep a symptom log: when nausea starts, how long it lasts, associated symptoms ?? When to Seek Urgent Help: Can't keep fluids down Weight gain of >2-3 lbs in a day (possible fluid  overload) Chest pain, shortness of breath, palpitations Persistent vomiting >24 hours  ?? Your Role as PCP Right Now: Manage symptoms and monitor labs Coordinate with dialysis nurse/staff: share concern and request adjustment of ultrafiltration rate, dialysate sodium, or anti-nausea premeds Document clearly and call nephrology office if symptoms worsen--they may squeeze him in sooner  Would you like a brief patient handout or EMR template for this? Or a medication grid to help decide what to pause vs continue?

## 2024-03-14 NOTE — Progress Notes (Signed)
 ==============================  [facility]: [phone]   -- Medical Office Visit --  Patient: Sean [name]      Age: 53 y.o.       Sex:  male  Date:   03/14/2024 Today's Healthcare Provider: Bernardino KANDICE Cone, MD  ==============================   Chief Complaint: Dizziness (Pt states having dizziness and nausea comes and goes at times.) Associated with dialysis  History of Present Illness 53 year old male with a complex past medical history, most notably End-Stage Renal Disease (ESRD) on hemodialysis, who presents today accompanied by his sister with complaints of recurrent dizziness and nausea, particularly following his dialysis sessions. The primary concern is persistent, severe vomiting that occurs primarily after dialysis, often lasting through the night. His sister corroborates this, stating it has been a consistent issue since initiating dialysis and leads to significant discomfort and weakness. Associated with these episodes, especially post-dialysis, are symptomatic hypotension with reported blood pressure readings as low as 80/60 mmHg, resulting in weakness severe enough to require wheelchair transport after treatments. The patient also experiences muscle cramps during dialysis sessions, which his sister attributes to electrolyte imbalances. His medication regimen is complex and adjustments have been made recently due to these symptoms. Losartan  has been held due to hypotension. He is taking sucralfate , initiated for a suspected stress ulcer contributing to nausea/vomiting, while pantoprazole  (previously prescribed, now listed as discontinued) was also used for GI protection. He has a history of taking amitriptyline  25mg  at bedtime for depression, which reportedly helped previous morning nausea, but there is concern it might be exacerbating his current post-dialysis symptoms; this medication is agreed being held for assessment. Dietary  management, particularly adherence to a low-phosphate diet required for ESRD, is challenging for the patient and his family, who have a background of consuming higher-fat, fried foods. Ensuring adequate nutrition before dialysis to potentially mitigate symptoms is also an area of focus. Additionally, just prior to concluding the visit, examination revealed findings concerning for either an infected hemorrhoid or a gluteal abscess on the patient's buttocks, adding an acute infectious component to his presentation. Past Medical History (Relevant): ESRD on HD, Hypertension, Morbid Obesity (BMI 52.72), Coronary Artery Disease (s/p CABG 2017), Atrial Flutter (on Eliquis ), Hyperlipidemia (statin intolerant, on Repatha ), Chronic Liver Disease (MASH, Cardiac Cirrhosis, elevated LFTs, splenomegaly), Thrombocytopenia, MGUS, GERD/PUD, Prediabetes, Obstructive Sleep Apnea, Chronic Gout, Hypothyroidism. Allergies: Metformin  (Other), Doxycycline  (SOB), Chlorhexidine  (Itching), Amoxicillin-Clavulanate (Itching), Isosorbide  Dinitrate (Diarrhea), Tape (Rash), Valacyclovir  (Itching).  Medications: Reviewed: Current Outpatient Medications on File Prior to Visit  Medication Sig   acetaminophen  (TYLENOL ) 500 MG tablet Take 1,500 mg by mouth once as needed for moderate pain (pain score 4-6), fever, headache or mild pain (pain score 1-3).   Acetaminophen  500 MG PACK Take by mouth.   allopurinol  (ZYLOPRIM ) 100 MG tablet Take 1 tablet (100 mg total) by mouth daily.   B Complex-C-Folic Acid  (DIALYVITE  TABLET) TABS Take by mouth.   calcium  acetate (PHOSLO ) 667 MG capsule Take 2 capsules (1,334 mg total) by mouth 3 (three) times daily with meals.   Calcium  Carb-Cholecalciferol 600-25 MG-MCG CAPS Take by mouth.   Cholecalciferol (VITAMIN D3) 125 MCG (5000 UT) CAPS Take 1 capsule (5,000 Units total) by mouth daily at 12 noon.   Cyanocobalamin  (VITAMIN B-12 PO) Place 1 tablet under the tongue daily.   diltiazem  (CARDIZEM  SR) 120  MG 12 hr capsule TAKE 1 CAPSULE(120 MG) BY MOUTH TWICE DAILY   ELIQUIS  5 MG TABS tablet TAKE 1 TABLET(5 MG) BY MOUTH  TWICE DAILY   Evolocumab  (REPATHA  SURECLICK) 140 MG/ML SOAJ INJECT THE CONTENTS OF 1 SYRINGE UNDER THE SKIN EVERY 14 DAYS   famotidine -calcium  carbonate-magnesium  hydroxide (PEPCID  COMPLETE) 10-800-165 MG chewable tablet Chew 1 tablet by mouth 2 (two) times daily as needed.   fluticasone  (FLONASE ) 50 MCG/ACT nasal spray Place 2 sprays into both nostrils daily.   iron sucrose in sodium chloride  0.9 % 100 mL Iron Sucrose (Venofer)   metoprolol  tartrate (LOPRESSOR ) 25 MG tablet Take 1 tablet (25 mg total) by mouth 2 (two) times daily.   montelukast  (SINGULAIR ) 10 MG tablet TAKE 1 TABLET(10 MG) BY MOUTH DAILY (Patient taking differently: Take 10 mg by mouth daily.)   nitroGLYCERIN  (NITROSTAT ) 0.4 MG SL tablet Place 1 tablet (0.4 mg total) under the tongue every 5 (five) minutes as needed for chest pain.   ondansetron  (ZOFRAN -ODT) 4 MG disintegrating tablet Take 1 tablet (4 mg total) by mouth every 8 (eight) hours as needed for nausea or vomiting.   polyethylene glycol (MIRALAX  / GLYCOLAX ) 17 g packet Mix 17 grams (1 packet) in 8 ounces of fluid and take by mouth daily. (Patient taking differently: Take 17 g by mouth daily as needed for moderate constipation.)   triamcinolone  (NASACORT ) 55 MCG/ACT AERO nasal inhaler Place 1 spray into the nose daily. Start with 1 spray each side twice a day for 3 days, then reduce to daily.   amitriptyline  (ELAVIL ) 25 MG tablet Take 1 tablet (25 mg total) by mouth at bedtime. (Patient taking differently: Take 25 mg by mouth at bedtime.)   azelastine  (ASTELIN ) 0.1 % nasal spray Place 2 sprays into both nostrils 2 (two) times daily. Use in each nostril as directed (Patient taking differently: Place 2 sprays into both nostrils as needed for allergies. Use in each nostril as directed)   diclofenac  Sodium (VOLTAREN ) 1 % GEL Apply 2 g topically 4 (four) times daily  for 7 days, THEN 2 g 4 (four) times daily as needed. (Patient not taking: Reported on 03/14/2024)   ketoconazole  (NIZORAL ) 2 % cream Apply 1 Application topically 2 (two) times daily. (Patient not taking: Reported on 03/14/2024)   sucralfate  (CARAFATE ) 1 g tablet Take 1 tablet (1 g total) by mouth at bedtime.   tirzepatide  (MOUNJARO ) 2.5 MG/0.5ML Pen Inject 2.5 mg into the skin once a week. (Patient not taking: Reported on 03/14/2024)   Current Facility-Administered Medications on File Prior to Visit  Medication   technetium tetrofosmin  (TC-MYOVIEW ) injection 29.8 millicurie   Medications Discontinued During This Encounter  Medication Reason   docusate sodium  (COLACE) 100 MG capsule Completed Course   pantoprazole  (PROTONIX ) 40 MG tablet Completed Course   methocarbamol  (ROBAXIN ) 500 MG tablet Completed Course   famotidine  (PEPCID ) 20 MG tablet Completed Course   torsemide  (DEMADEX ) 10 MG tablet Completed Course   sitaGLIPtin (JANUVIA) 50 MG tablet Completed Course       Physical Exam:    03/14/2024    1:56 PM 02/12/2024    1:01 PM 02/05/2024    8:03 AM  Vitals with BMI  Height 5' 11 5' 11 5' 11  Weight 377 lbs 13 oz 375 lbs 375 lbs 3 oz  BMI 52.72 52.33 52.35  Systolic 110 126 884  Diastolic 60 76 71  Pulse 76 55 56   Wt Readings from Last 10 Encounters:  03/14/24 (!) 377 lb 12.8 oz (171.4 kg)  02/12/24 (!) 375 lb (170.1 kg)  02/05/24 (!) 375 lb 3.2 oz (170.2 kg)  02/01/24 (!) 381 lb  3.2 oz (172.9 kg)  01/29/24 (!) 381 lb 4.8 oz (173 kg)  01/16/24 (!) 382 lb 0.9 oz (173.3 kg)  01/05/24 (!) 400 lb (181.4 kg)  01/01/24 (!) 400 lb 9.6 oz (181.7 kg)  12/14/23 (!) 387 lb 9.6 oz (175.8 kg)  12/01/23 (!) 391 lb (177.4 kg)  Vital signs reviewed.  Nursing notes reviewed. Weight trend reviewed. Physical Exam  Physical Exam  General Appearance:  No acute distress appreciable.   Well-groomed, healthy-appearing male.  Well proportioned with no abnormal fat distribution.  Good muscle  tone. Pulmonary:  Normal work of breathing at rest, no respiratory distress apparent. SpO2: 98 %  Musculoskeletal: All extremities are intact.  Neurological:  Awake, alert, oriented, and engaged.  No obvious focal neurological deficits or cognitive impairments.  Sensorium seems unclouded.   Speech is clear and coherent with logical content. Psychiatric:  Appropriate mood, pleasant and cooperative demeanor, thoughtful and engaged during the exam  Office Visit on 03/14/2024  Component Date Value   Cholesterol 03/14/2024 208 (H)    Triglycerides 03/14/2024 167.0 (H)    HDL 03/14/2024 50.60    VLDL 03/14/2024 33.4    LDL Cholesterol 03/14/2024 124 (H)    Total CHOL/HDL Ratio 03/14/2024 4    NonHDL 03/14/2024 157.38    Sodium 03/14/2024 137    Potassium 03/14/2024 4.3    Chloride 03/14/2024 98    CO2 03/14/2024 22    Glucose, Bld 03/14/2024 152 (H)    BUN 03/14/2024 69 (H)    Creatinine, Ser 03/14/2024 10.96 (HH)    Total Bilirubin 03/14/2024 1.6 (H)    Alkaline Phosphatase 03/14/2024 244 (H)    AST 03/14/2024 55 (H)    ALT 03/14/2024 64 (H)    Total Protein 03/14/2024 6.1    Albumin  03/14/2024 3.3 (L)    GFR 03/14/2024 4.94 (LL)    Calcium  03/14/2024 9.2    WBC 03/14/2024 7.8    RBC 03/14/2024 3.07 (L)    Hemoglobin 03/14/2024 11.0 (L)    HCT 03/14/2024 32.0 (L)    MCV 03/14/2024 104.2 (H)    MCHC 03/14/2024 34.4    RDW 03/14/2024 15.9 (H)    Platelets 03/14/2024 89.0 (L)    Neutrophils Relative % 03/14/2024 59.4    Lymphocytes Relative 03/14/2024 17.6    Monocytes Relative 03/14/2024 16.0 (H)    Eosinophils Relative 03/14/2024 6.6 (H)    Basophils Relative 03/14/2024 0.4    Neutro Abs 03/14/2024 4.6    Lymphs Abs 03/14/2024 1.4    Monocytes Absolute 03/14/2024 1.2 (H)    Eosinophils Absolute 03/14/2024 0.5    Basophils Absolute 03/14/2024 0.0    TSH 03/14/2024 2.070    Hgb A1c MFr Bld 03/14/2024 6.1    Magnesium  03/14/2024 2.4    Phosphorus 03/14/2024 8.1 (H)    Appointment on 01/18/2024  Component Date Value   WBC Count 01/18/2024 7.4    RBC 01/18/2024 3.33 (L)    Hemoglobin 01/18/2024 11.4 (L)    HCT 01/18/2024 33.4 (L)    MCV 01/18/2024 100.3 (H)    MCH 01/18/2024 34.2 (H)    MCHC 01/18/2024 34.1    RDW 01/18/2024 14.8    Platelet Count 01/18/2024 70 (L)    nRBC 01/18/2024 0.00    Neutrophils Relative % 01/18/2024 48    Neutro Abs 01/18/2024 3.6    Lymphocytes Relative 01/18/2024 26    Lymphs Abs 01/18/2024 1.9    Monocytes Relative 01/18/2024 16    Monocytes Absolute 01/18/2024 1.2 (H)  Eosinophils Relative 01/18/2024 8    Eosinophils Absolute 01/18/2024 0.6 (H)    Basophils Relative 01/18/2024 1    Basophils Absolute 01/18/2024 0.1    Immature Granulocytes 01/18/2024 1    Abs Immature Granulocytes 01/18/2024 0.07    nRBC 01/18/2024 0    Immature Platelet Fracti* 01/18/2024 0.3 (L)    Ferritin 01/18/2024 114    Iron 01/18/2024 152    TIBC 01/18/2024 456 (H)    Saturation Ratios 01/18/2024 33    UIBC 01/18/2024 304    Vitamin B-12 01/18/2024 6,881 (H)    IgG (Immunoglobin G), Se* 01/18/2024 561 (L)    IgA 01/18/2024 1,203 (H)    IgM (Immunoglobulin M), * 01/18/2024 91    Total Protein ELP 01/18/2024 5.9 (L)    Albumin  SerPl Elph-Mcnc 01/18/2024 2.8 (L)    Alpha 1 01/18/2024 0.3    Alpha2 Glob SerPl Elph-M* 01/18/2024 0.6    B-Globulin SerPl Elph-Mc* 01/18/2024 1.7 (H)    Gamma Glob SerPl Elph-Mc* 01/18/2024 0.4    M Protein SerPl Elph-Mcnc 01/18/2024 0.9 (H)    Globulin, Total 01/18/2024 3.1    Albumin /Glob SerPl 01/18/2024 1.0    IFE 1 01/18/2024 Comment (A)    Please Note 01/18/2024 Comment    Kappa free light chain 01/18/2024 1,099.9 (H)    Lambda free light chains 01/18/2024 33.2 (H)    Kappa, lambda light chai* 01/18/2024 33.13 (H)   No results displayed because visit has over 200 results.    Admission on 01/05/2024, Discharged on 01/05/2024  Component Date Value   WBC 01/05/2024 6.5    RBC 01/05/2024 3.17  (L)    Hemoglobin 01/05/2024 10.8 (L)    HCT 01/05/2024 32.8 (L)    MCV 01/05/2024 103.5 (H)    MCH 01/05/2024 34.1 (H)    MCHC 01/05/2024 32.9    RDW 01/05/2024 14.2    Platelets 01/05/2024 66 (L)    nRBC 01/05/2024 0.0    Neutrophils Relative % 01/05/2024 54    Neutro Abs 01/05/2024 3.6    Lymphocytes Relative 01/05/2024 22    Lymphs Abs 01/05/2024 1.4    Monocytes Relative 01/05/2024 12    Monocytes Absolute 01/05/2024 0.8    Eosinophils Relative 01/05/2024 10    Eosinophils Absolute 01/05/2024 0.6 (H)    Basophils Relative 01/05/2024 1    Basophils Absolute 01/05/2024 0.1    Immature Granulocytes 01/05/2024 1    Abs Immature Granulocytes 01/05/2024 0.05    Sodium 01/05/2024 142    Potassium 01/05/2024 3.9    Chloride 01/05/2024 107    CO2 01/05/2024 24    Glucose, Bld 01/05/2024 151 (H)    BUN 01/05/2024 57 (H)    Creatinine, Ser 01/05/2024 6.95 (H)    Calcium  01/05/2024 8.9    Total Protein 01/05/2024 5.9 (L)    Albumin  01/05/2024 2.4 (L)    AST 01/05/2024 27    ALT 01/05/2024 26    Alkaline Phosphatase 01/05/2024 140 (H)    Total Bilirubin 01/05/2024 0.7    GFR, Estimated 01/05/2024 9 (L)    Anion gap 01/05/2024 11    Magnesium  01/05/2024 2.4    B Natriuretic Peptide 01/05/2024 430.6 (H)    SARS Coronavirus 2 by RT* 01/05/2024 NEGATIVE    Influenza A by PCR 01/05/2024 NEGATIVE    Influenza B by PCR 01/05/2024 NEGATIVE    Resp Syncytial Virus by * 01/05/2024 NEGATIVE   Scanned Document on 12/28/2023  Component Date Value   PTH 12/23/2023 177  Calcium  12/23/2023 9.1    EGFR 12/23/2023 10.0   Office Visit on 12/14/2023  Component Date Value   WBC 12/14/2023 6.3    RBC 12/14/2023 3.69 (L)    Hemoglobin 12/14/2023 12.6 (L)    HCT 12/14/2023 37.0 (L)    MCV 12/14/2023 100.3 (H)    MCHC 12/14/2023 34.0    RDW 12/14/2023 14.3    Platelets 12/14/2023 70.0 (L)    Neutrophils Relative % 12/14/2023 67.3    Lymphocytes Relative 12/14/2023 16.1    Monocytes  Relative 12/14/2023 11.9    Eosinophils Relative 12/14/2023 3.9    Basophils Relative 12/14/2023 0.8    Neutro Abs 12/14/2023 4.2    Lymphs Abs 12/14/2023 1.0    Monocytes Absolute 12/14/2023 0.8    Eosinophils Absolute 12/14/2023 0.2    Basophils Absolute 12/14/2023 0.0    Sodium 12/14/2023 143    Potassium 12/14/2023 3.5    Chloride 12/14/2023 107    CO2 12/14/2023 26    Glucose, Bld 12/14/2023 110 (H)    BUN 12/14/2023 45 (H)    Creatinine, Ser 12/14/2023 5.98 (HH)    Total Bilirubin 12/14/2023 0.9    Alkaline Phosphatase 12/14/2023 171 (H)    AST 12/14/2023 37    ALT 12/14/2023 39    Total Protein 12/14/2023 6.6    Albumin  12/14/2023 3.3 (L)    GFR 12/14/2023 10.23 (LL)    Calcium  12/14/2023 9.3    Color, Urine 12/14/2023 YELLOW    APPearance 12/14/2023 CLEAR    Specific Gravity, Urine 12/14/2023 1.020    pH 12/14/2023 6.5    Total Protein, Urine 12/14/2023 >=300 (A)    Urine Glucose 12/14/2023 NEGATIVE    Ketones, ur 12/14/2023 NEGATIVE    Bilirubin Urine 12/14/2023 NEGATIVE    Hgb urine dipstick 12/14/2023 SMALL (A)    Urobilinogen, UA 12/14/2023 0.2    Leukocytes,Ua 12/14/2023 NEGATIVE    Nitrite 12/14/2023 NEGATIVE    WBC, UA 12/14/2023 0-2/hpf    RBC / HPF 12/14/2023 3-6/hpf (A)    Squamous Epithelial / HPF 12/14/2023 Rare(0-4/hpf)   Office Visit on 12/01/2023  Component Date Value   WBC 12/01/2023 5.8    RBC 12/01/2023 3.63 (L)    Hemoglobin 12/01/2023 12.6 (L)    HCT 12/01/2023 36.1 (L)    MCV 12/01/2023 99.6    MCHC 12/01/2023 34.8    RDW 12/01/2023 14.9    Platelets 12/01/2023 67.0 (L)    Neutrophils Relative % 12/01/2023 56.2    Lymphocytes Relative 12/01/2023 23.7    Monocytes Relative 12/01/2023 13.0 (H)    Eosinophils Relative 12/01/2023 6.2 (H)    Basophils Relative 12/01/2023 0.9    Neutro Abs 12/01/2023 3.2    Lymphs Abs 12/01/2023 1.4    Monocytes Absolute 12/01/2023 0.8    Eosinophils Absolute 12/01/2023 0.4    Basophils Absolute  12/01/2023 0.0    Sodium 12/01/2023 140    Potassium 12/01/2023 3.5    Chloride 12/01/2023 104    CO2 12/01/2023 25    Glucose, Bld 12/01/2023 96    BUN 12/01/2023 50 (H)    Creatinine, Ser 12/01/2023 5.78 (HH)    Total Bilirubin 12/01/2023 0.8    Alkaline Phosphatase 12/01/2023 178 (H)    AST 12/01/2023 34    ALT 12/01/2023 44    Total Protein 12/01/2023 6.7    Albumin  12/01/2023 3.5    GFR 12/01/2023 10.66 (LL)    Calcium  12/01/2023 9.3    TSH W/REFLEX TO FT4 12/01/2023 3.83  Color, Urine 12/01/2023 YELLOW    APPearance 12/01/2023 CLEAR    Specific Gravity, Urine 12/01/2023 1.015    pH 12/01/2023 6.0    Total Protein, Urine 12/01/2023 100 (A)    Urine Glucose 12/01/2023 250 (A)    Ketones, ur 12/01/2023 NEGATIVE    Bilirubin Urine 12/01/2023 NEGATIVE    Hgb urine dipstick 12/01/2023 SMALL (A)    Urobilinogen, UA 12/01/2023 0.2    Leukocytes,Ua 12/01/2023 NEGATIVE    Nitrite 12/01/2023 NEGATIVE    WBC, UA 12/01/2023 0-2/hpf    RBC / HPF 12/01/2023 0-2/hpf    Squamous Epithelial / HPF 12/01/2023 Rare(0-4/hpf)    POC Glucose 12/01/2023 116 (A)   Office Visit on 11/09/2023  Component Date Value   WBC 11/09/2023 4.8    RBC 11/09/2023 3.42 (L)    Hemoglobin 11/09/2023 11.7 (L)    HCT 11/09/2023 34.3 (L)    MCV 11/09/2023 100.1 (H)    MCHC 11/09/2023 34.2    RDW 11/09/2023 15.3    Platelets 11/09/2023 66.0 (L)    Neutrophils Relative % 11/09/2023 56.8    Lymphocytes Relative 11/09/2023 25.4    Monocytes Relative 11/09/2023 11.2    Eosinophils Relative 11/09/2023 5.5 (H)    Basophils Relative 11/09/2023 1.1    Neutro Abs 11/09/2023 2.7    Lymphs Abs 11/09/2023 1.2    Monocytes Absolute 11/09/2023 0.5    Eosinophils Absolute 11/09/2023 0.3    Basophils Absolute 11/09/2023 0.1    Sodium 11/09/2023 141    Potassium 11/09/2023 3.9    Chloride 11/09/2023 107    CO2 11/09/2023 23    Glucose, Bld 11/09/2023 159 (H)    BUN 11/09/2023 66 (H)    Creatinine, Ser 11/09/2023  5.04 (HH)    Total Bilirubin 11/09/2023 0.7    Alkaline Phosphatase 11/09/2023 199 (H)    AST 11/09/2023 35    ALT 11/09/2023 45    Total Protein 11/09/2023 6.8    Albumin  11/09/2023 3.3 (L)    GFR 11/09/2023 12.57 (LL)    Calcium  11/09/2023 8.9    Creatinine, Urine 11/09/2023 42    Protein/Creat Ratio 11/09/2023 2,762 (H)    Protein/Creatinine Ratio 11/09/2023 2.762 (H)    Total Protein, Urine 11/09/2023 116 (H)    Color, Urine 11/09/2023 YELLOW    APPearance 11/09/2023 CLEAR    Specific Gravity, Urine 11/09/2023 1.015    pH 11/09/2023 6.0    Total Protein, Urine 11/09/2023 100 (A)    Urine Glucose 11/09/2023 >=1000 (A)    Ketones, ur 11/09/2023 NEGATIVE    Bilirubin Urine 11/09/2023 NEGATIVE    Hgb urine dipstick 11/09/2023 SMALL (A)    Urobilinogen, UA 11/09/2023 0.2    Leukocytes,Ua 11/09/2023 NEGATIVE    Nitrite 11/09/2023 NEGATIVE    WBC, UA 11/09/2023 0-2/hpf    RBC / HPF 11/09/2023 0-2/hpf    Mucus, UA 11/09/2023 Presence of (A)    Squamous Epithelial / HPF 11/09/2023 Rare(0-4/hpf)    Amorphous 11/09/2023 Present (A)   Admission on 11/02/2023, Discharged on 11/04/2023  Component Date Value   Sodium 11/02/2023 140    Potassium 11/02/2023 3.9    Chloride 11/02/2023 106    CO2 11/02/2023 25    Glucose, Bld 11/02/2023 170 (H)    BUN 11/02/2023 70 (H)    Creatinine, Ser 11/02/2023 5.63 (H)    Calcium  11/02/2023 9.1    Total Protein 11/02/2023 7.1    Albumin  11/02/2023 2.8 (L)    AST 11/02/2023 57 (H)    ALT 11/02/2023 71 (  H)    Alkaline Phosphatase 11/02/2023 188 (H)    Total Bilirubin 11/02/2023 0.7    GFR, Estimated 11/02/2023 12 (L)    Anion gap 11/02/2023 9    WBC 11/02/2023 5.3    RBC 11/02/2023 3.49 (L)    Hemoglobin 11/02/2023 11.3 (L)    HCT 11/02/2023 35.8 (L)    MCV 11/02/2023 102.6 (H)    MCH 11/02/2023 32.4    MCHC 11/02/2023 31.6    RDW 11/02/2023 14.6    Platelets 11/02/2023 54 (L)    nRBC 11/02/2023 0.0    ABO/RH(D) 11/02/2023 A POS     Antibody Screen 11/02/2023 NEG    Sample Expiration 11/02/2023                     Value:11/05/2023,2359 Performed at [facility], [address], [place], [place] [address]    Fecal Occult Bld 11/02/2023 NEGATIVE    Troponin I (High Sensiti* 11/02/2023 3    B Natriuretic Peptide 11/02/2023 25.4    Hemoglobin 11/02/2023 11.6 (L)    HCT 11/02/2023 35.9 (L)    Glucose-Capillary 11/02/2023 123 (H)    WBC 11/03/2023 5.1    RBC 11/03/2023 3.37 (L)    Hemoglobin 11/03/2023 11.3 (L)    HCT 11/03/2023 35.3 (L)    MCV 11/03/2023 104.7 (H)    MCH 11/03/2023 33.5    MCHC 11/03/2023 32.0    RDW 11/03/2023 14.8    Platelets 11/03/2023 56 (L)    nRBC 11/03/2023 0.0    Sodium 11/03/2023 143    Potassium 11/03/2023 3.7    Chloride 11/03/2023 111    CO2 11/03/2023 23    Glucose, Bld 11/03/2023 140 (H)    BUN 11/03/2023 65 (H)    Creatinine, Ser 11/03/2023 5.44 (H)    Calcium  11/03/2023 8.7 (L)    Total Protein 11/03/2023 6.3 (L)    Albumin  11/03/2023 2.6 (L)    AST 11/03/2023 53 (H)    ALT 11/03/2023 71 (H)    Alkaline Phosphatase 11/03/2023 185 (H)    Total Bilirubin 11/03/2023 0.6    GFR, Estimated 11/03/2023 12 (L)    Anion gap 11/03/2023 9    Magnesium  11/03/2023 2.3    Phosphorus 11/03/2023 4.9 (H)    Glucose-Capillary 11/03/2023 111 (H)    Glucose-Capillary 11/03/2023 98    Color, Urine 11/03/2023 YELLOW    APPearance 11/03/2023 CLEAR    Specific Gravity, Urine 11/03/2023 1.011    pH 11/03/2023 6.0    Glucose, UA 11/03/2023 >=500 (A)    Hgb urine dipstick 11/03/2023 NEGATIVE    Bilirubin Urine 11/03/2023 NEGATIVE    Ketones, ur 11/03/2023 NEGATIVE    Protein, ur 11/03/2023 100 (A)    Nitrite 11/03/2023 NEGATIVE    Leukocytes,Ua 11/03/2023 NEGATIVE    RBC / HPF 11/03/2023 0-5    WBC, UA 11/03/2023 0-5    Bacteria, UA 11/03/2023 NONE SEEN    Squamous Epithelial / HPF 11/03/2023 0-5    Glucose-Capillary 11/03/2023 105 (H)    WBC 11/04/2023 4.9    RBC 11/04/2023 3.15  (L)    Hemoglobin 11/04/2023 10.2 (L)    HCT 11/04/2023 31.6 (L)    MCV 11/04/2023 100.3 (H)    MCH 11/04/2023 32.4    MCHC 11/04/2023 32.3    RDW 11/04/2023 14.8    Platelets 11/04/2023 54 (L)    nRBC 11/04/2023 0.0    Magnesium  11/04/2023 2.2    Sodium 11/04/2023 141    Potassium 11/04/2023  4.0    Chloride 11/04/2023 111    CO2 11/04/2023 22    Glucose, Bld 11/04/2023 132 (H)    BUN 11/04/2023 62 (H)    Creatinine, Ser 11/04/2023 5.25 (H)    Calcium  11/04/2023 8.3 (L)    Phosphorus 11/04/2023 4.8 (H)    Albumin  11/04/2023 2.5 (L)    GFR, Estimated 11/04/2023 13 (L)    Anion gap 11/04/2023 8    Glucose-Capillary 11/03/2023 167 (H)    Glucose-Capillary 11/04/2023 153 (H)   Scanned Document on 11/02/2023  Component Date Value   EGFR 10/23/2023 16.0   There may be more visits with results that are not included.  No image results found.  Results LABS Blood Glucose: 140-160 mg/dL    Assessment and Plan: This is a 53 year old male with ESRD on hemodialysis presenting with significant post-dialysis complications (nausea, vomiting, hypotension, cramps) and a newly identified potential gluteal abscess/infected hemorrhoid. Assessment & Plan Nausea and vomiting, unspecified vomiting type  Post-Dialysis Nausea, Vomiting, and Hypotension: Assessment: Symptoms likely multifactorial, related to fluid shifts and electrolyte imbalances during dialysis (intradialytic hypotension, potential transient hyponatremia or hypocalcemia despite current normal labs), uremia, possible contribution from GERD/stress ulcer, and potential side effects from medications (e.g., amitriptyline ). Current BP 110/60, but history of significant drops post-HD. Plan:  Hold Amitriptyline : Assess if discontinuation improves post-dialysis symptoms. Continue Sucralfate : 1g PO QHS for suspected stress ulcer/gastritis. Ondansetron : Continue 4mg  ODT PO Q8H PRN nausea/vomiting. Nephrology Consultation: Urgent  communication with Dr. Tobie (Nephrology) recommended to review dialysis prescription (e.g., ultrafiltration rate, sodium/calcium  modeling, dialysate buffer) to minimize hypotension and cramping. Monitoring: Encourage checking post-dialysis BP. Consider checking post-dialysis electrolytes (Na, Ca) if symptoms persist despite dialysis adjustments. Diet: Encourage adequate nutritional intake, especially a balanced meal, before dialysis sessions. ESRD (end stage renal disease) on dialysis (HCC)  End-Stage Renal Disease (ESRD) on Hemodialysis: Assessment: Confirmed ESRD (Cr 10.96, GFR 4.94). Complicated by anemia (Hgb 11.0), thrombocytopenia (Plt 89.0), hyperphosphatemia (Phos 8.1), low albumin  (3.3), and likely contributing to fatigue and malaise. Plan:  Continue Hemodialysis: As scheduled. Phosphate Management: Continue Calcium  Acetate 1334mg  PO TID with meals. Strongly reinforce adherence to low-phosphate diet; provide additional education/resources if needed. Anemia Management: Continue scheduled Iron Sucrose infusions. Monitor Hgb. Nutrition: Continue B Complex-C-Folic Acid  (Dialyvite ). Address low albumin  - reinforce protein intake as allowed by renal diet, consider dietitian referral. Nephrology Follow-up: Essential for ongoing dialysis management. Gluteal abscess Infected Hemorrhoid / Gluteal Abscess: Assessment: Acute finding on exam concerning for localized infection. May contribute to systemic symptoms/malaise. Plan:  Antibiotics: Prescribe Clindamycin  150mg  capsules, Take 3 capsules (450mg ) PO TID x 7 days. (Dispense #63). Local Care: Counsel on warm compresses/sitz baths (if feasible/tolerated), hygiene. Follow-up: Advise patient to monitor for worsening symptoms (increased pain, swelling, redness, fever, purulent drainage) and return or seek urgent care if concerns arise. Consider surgical evaluation for incision and drainage if no improvement or abscess clearly forms. Hypertension,  unspecified type Hypertension / Intradialytic Hypotension: Assessment: History of hypertension, but currently experiencing significant post-dialysis hypotension requiring holding of antihypertensives. Today's BP 110/60. Plan:  Continue Holding Losartan . Continue Metoprolol  Tartrate 25mg  PO BID. Continue Diltiazem  SR 120mg  PO BID. Monitor BP closely, especially pre- and post-dialysis. Coordinate adjustments with Nephrology based on dialysis tolerance. Chronic liver failure without hepatic coma (HCC) Chronic Liver Disease / Elevated LFTs: Assessment: History of MASH, cardiac cirrhosis. Persistently elevated LFTs (AST 55, ALT 64, Alk Phos 244, T.Bili 1.6) and low albumin  (3.3), thrombocytopenia (89.0), likely multifactorial (liver disease, cardiac component, MGUS?). Plan:  Order  Liver Doppler Ultrasound: Assess for portal hypertension, fibrosis/cirrhosis. Referral to Gastroenterology: For further evaluation and management of chronic liver disease. Prediabetes Prediabetes / Hyperglycemia: Assessment: Random glucose 152, HbA1c 6.1%. Sitagliptin recently discontinued (was used off-label). Plan:  Check HbA1c (ordered today). Continue Lifestyle/Dietary management as appropriate within renal diet restrictions. No current indication for pharmacologic therapy for glucose control based on A1c. Morbid obesity (HCC) Morbid Obesity (BMI 52.72): Assessment: Significant contributor to multiple comorbidities. Tirzepatide  previously discontinued. Plan: Continue counseling on diet/lifestyle within significant constraints of ESRD/other conditions. Consider referral to renal dietitian. Mixed hyperlipidemia Mixed Hyperlipidemia: Assessment: Elevated LDL (124), Total Cholesterol (208), Triglycerides (167). Statin intolerant. Plan:  Continue Evolocumab  (Repatha ) 140mg  SQ every 14 days. Continue Lifestyle/Dietary management. Check lipid panel (ordered today). 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 Anemia also present. Likely contributes to hematologic abnormalities alongside CKD/Liver disease. Plan:  Continue Apixaban  (Eliquis ) 5mg  PO BID for Atrial Flutter (risk/benefit assessed in context of thrombocytopenia). Monitor CBC periodically (ordered today). Hematology follow-up as previously established/indicated. Elevated LFTs  ESRD (end stage renal disease) (HCC) End-Stage Renal Disease (ESRD) on Hemodialysis (HCC) (Diagnosed ~01/13/2024, previously CKD Stage 4 noted 01/04/2016) Current Status: Actively undergoing thrice-weekly hemodialysis. Current labs (03/14/2024) show Cr 10.96 mg/dL, BUN 69 mg/dL, GFR 5.05 mL/min. Patient experiences significant post-dialysis nausea, vomiting, hypotension (SBP as low as 80s), weakness, and cramping. Hyperphosphatemia noted (Phos 8.1 mg/dL). Chronic anemia (Hgb 11.0 g/dL) and low albumin  (3.3 g/dL) persist. Compliant with dialysis schedule; adherence to 32 oz fluid restriction mentioned previously. Etiology considered secondary to HTN and possibly plasma cell dyscrasia (MGUS). Recent hospitalization 01/05/2024 (reason not specified but likely ESRD-related); dialysis catheter placed 01/11/2024. Management Plan: Continue hemodialysis 3x/week. Continue Calcium  Acetate 1334mg  PO TID w/ meals (phosphate binder). Continue Iron Sucrose infusions and dialysis vitamins (B Complex/Folic Acid ). Strongly reinforce adherence to low-phosphate diet. Monitor labs closely (CMP, CBC, Mg, Phos ordered 03/14/24). Urgent consultation with Nephrology (Dr. Tobie) to adjust dialysis prescription aiming to mitigate intradialytic hypotension and cramping.  Metabolic dysfunction-associated steatohepatitis (MASH)  Seasonal allergic rhinitis due to pollen  Hypothyroidism,  unspecified type  Obstructive sleep apnea syndrome  Inflamed internal hemorrhoid  Chronic gout without tophus, unspecified cause, unspecified site   Health Maintenance / Follow-up: Labs Ordered Today: CBC w/ diff, CMP, Lipid Panel, HbA1c, TSH, Magnesium , Phosphorus. Review results when available. Imaging Ordered: US  Liver Doppler. Referrals: Gastroenterology. Follow-up: Coordinate closely with Nephrology (Dr. Tobie) regarding dialysis and medication adjustments. Follow up with PCP as needed after specialist consultations and lab results.       Orders Placed During this Encounter:    Meds ordered this encounter  Medications   clindamycin  (CLEOCIN ) 150 MG capsule    Sig: Take 3 capsules (450 mg total) by mouth 3 (three) times daily for 7 days.    Dispense:  63 capsule    Refill:  0   ED Discharge Orders          Ordered    US  LIVER DOPPLER        03/15/24 1455    Ambulatory referral to Gastroenterology        03/15/24 1455    Lipid panel       Comments: Specimen [id number]: [facility]    03/14/24 1443    Comprehensive metabolic panel with GFR        03/14/24 1443    CBC with  Differential/Platelet        03/14/24 1443    TSH Rfx on Abnormal to Free T4        03/14/24 1443    HgB A1c        03/14/24 1443    Magnesium         03/14/24 1445    Phosphorus        03/14/24 1445    clindamycin  (CLEOCIN ) 150 MG capsule  3 times daily        03/14/24 1531                        Additional Info: This encounter employed state-of-the-art, real-time, collaborative documentation. The patient actively reviewed and assisted in updating their electronic medical record on a shared screen, ensuring transparency and facilitating joint problem-solving for the problem list, overview, and plan. This approach promotes accurate, informed care. The treatment plan was discussed and reviewed in detail, including medication safety, potential side effects, and all patient questions. We  confirmed understanding and comfort with the plan. Follow-up instructions were established, including contacting the office for any concerns, returning if symptoms worsen, persist, or new symptoms develop, and precautions for potential emergency department visits.   This document was synthesized by artificial intelligence ([employer]) using HIPAA-compliant recording of the clinical interaction;   We discussed the use of AI scribe software for clinical note transcription with the patient, who gave verbal consent to proceed.

## 2024-03-15 ENCOUNTER — Telehealth: Payer: Self-pay

## 2024-03-15 ENCOUNTER — Encounter: Payer: Self-pay | Admitting: Internal Medicine

## 2024-03-15 DIAGNOSIS — K648 Other hemorrhoids: Secondary | ICD-10-CM | POA: Insufficient documentation

## 2024-03-15 DIAGNOSIS — Z992 Dependence on renal dialysis: Secondary | ICD-10-CM | POA: Diagnosis not present

## 2024-03-15 DIAGNOSIS — N186 End stage renal disease: Secondary | ICD-10-CM | POA: Diagnosis not present

## 2024-03-15 DIAGNOSIS — N2581 Secondary hyperparathyroidism of renal origin: Secondary | ICD-10-CM | POA: Diagnosis not present

## 2024-03-15 LAB — COMPREHENSIVE METABOLIC PANEL WITH GFR
ALT: 64 U/L — ABNORMAL HIGH (ref 0–53)
AST: 55 U/L — ABNORMAL HIGH (ref 0–37)
Albumin: 3.3 g/dL — ABNORMAL LOW (ref 3.5–5.2)
Alkaline Phosphatase: 244 U/L — ABNORMAL HIGH (ref 39–117)
BUN: 69 mg/dL — ABNORMAL HIGH (ref 6–23)
CO2: 22 meq/L (ref 19–32)
Calcium: 9.2 mg/dL (ref 8.4–10.5)
Chloride: 98 meq/L (ref 96–112)
Creatinine, Ser: 10.96 mg/dL (ref 0.40–1.50)
GFR: 4.94 mL/min — CL (ref >60.00)
Glucose, Bld: 152 mg/dL — ABNORMAL HIGH (ref 70–99)
Potassium: 4.3 meq/L (ref 3.5–5.1)
Sodium: 137 meq/L (ref 135–145)
Total Bilirubin: 1.6 mg/dL — ABNORMAL HIGH (ref 0.2–1.2)
Total Protein: 6.1 g/dL (ref 6.0–8.3)

## 2024-03-15 LAB — CBC WITH DIFFERENTIAL/PLATELET
Basophils Absolute: 0 10*3/uL (ref 0.0–0.1)
Basophils Relative: 0.4 % (ref 0.0–3.0)
Eosinophils Absolute: 0.5 10*3/uL (ref 0.0–0.7)
Eosinophils Relative: 6.6 % — ABNORMAL HIGH (ref 0.0–5.0)
HCT: 32 % — ABNORMAL LOW (ref 39.0–52.0)
Hemoglobin: 11 g/dL — ABNORMAL LOW (ref 13.0–17.0)
Lymphocytes Relative: 17.6 % (ref 12.0–46.0)
Lymphs Abs: 1.4 10*3/uL (ref 0.7–4.0)
MCHC: 34.4 g/dL (ref 30.0–36.0)
MCV: 104.2 fl — ABNORMAL HIGH (ref 78.0–100.0)
Monocytes Absolute: 1.2 10*3/uL — ABNORMAL HIGH (ref 0.1–1.0)
Monocytes Relative: 16 % — ABNORMAL HIGH (ref 3.0–12.0)
Neutro Abs: 4.6 10*3/uL (ref 1.4–7.7)
Neutrophils Relative %: 59.4 % (ref 43.0–77.0)
Platelets: 89 10*3/uL — ABNORMAL LOW (ref 150.0–400.0)
RBC: 3.07 Mil/uL — ABNORMAL LOW (ref 4.22–5.81)
RDW: 15.9 % — ABNORMAL HIGH (ref 11.5–15.5)
WBC: 7.8 10*3/uL (ref 4.0–10.5)

## 2024-03-15 LAB — MAGNESIUM: Magnesium: 2.4 mg/dL (ref 1.5–2.5)

## 2024-03-15 LAB — LIPID PANEL
Cholesterol: 208 mg/dL — ABNORMAL HIGH (ref 0–200)
HDL: 50.6 mg/dL (ref >39.00)
LDL Cholesterol: 124 mg/dL — ABNORMAL HIGH (ref 0–99)
NonHDL: 157.38
Total CHOL/HDL Ratio: 4
Triglycerides: 167 mg/dL — ABNORMAL HIGH (ref 0.0–149.0)
VLDL: 33.4 mg/dL (ref 0.0–40.0)

## 2024-03-15 LAB — HEMOGLOBIN A1C: Hgb A1c MFr Bld: 6.1 % (ref 4.6–6.5)

## 2024-03-15 LAB — TSH RFX ON ABNORMAL TO FREE T4: TSH: 2.07 u[IU]/mL (ref 0.450–4.500)

## 2024-03-15 LAB — PHOSPHORUS: Phosphorus: 8.1 mg/dL — ABNORMAL HIGH (ref 2.3–4.6)

## 2024-03-15 NOTE — Assessment & Plan Note (Signed)
 Hypertension / Intradialytic Hypotension: Assessment: History of hypertension, but currently experiencing significant post-dialysis hypotension requiring holding of antihypertensives. Today's BP 110/60. Plan:  Continue Holding Losartan. Continue Metoprolol Tartrate 25mg  PO BID. Continue Diltiazem SR 120mg  PO BID. Monitor BP closely, especially pre- and post-dialysis. Coordinate adjustments with Nephrology based on dialysis tolerance.

## 2024-03-15 NOTE — Assessment & Plan Note (Signed)
 Chronic Liver Disease / Elevated LFTs: Assessment: History of MASH, cardiac cirrhosis. Persistently elevated LFTs (AST 55, ALT 64, Alk Phos 244, T.Bili 1.6) and low albumin (3.3), thrombocytopenia (89.0), likely multifactorial (liver disease, cardiac component, MGUS?). Plan:  Order Liver Doppler Ultrasound: Assess for portal hypertension, fibrosis/cirrhosis. Referral to Gastroenterology: For further evaluation and management of chronic liver disease.

## 2024-03-15 NOTE — Assessment & Plan Note (Addendum)
 End-Stage Renal Disease (ESRD) on Hemodialysis (HCC) (Diagnosed ~01/13/2024, previously CKD Stage 4 noted 01/04/2016) Current Status: Actively undergoing thrice-weekly hemodialysis. Current labs (03/14/2024) show Cr 10.96 mg/dL, BUN 69 mg/dL, GFR 1.61 mL/min. Patient experiences significant post-dialysis nausea, vomiting, hypotension (SBP as low as 80s), weakness, and cramping. Hyperphosphatemia noted (Phos 8.1 mg/dL). Chronic anemia (Hgb 11.0 g/dL) and low albumin (3.3 g/dL) persist. Compliant with dialysis schedule; adherence to 32 oz fluid restriction mentioned previously. Etiology considered secondary to HTN and possibly plasma cell dyscrasia (MGUS). Recent hospitalization 01/05/2024 (reason not specified but likely ESRD-related); dialysis catheter placed 01/11/2024. Management Plan: Continue hemodialysis 3x/week. Continue Calcium Acetate 1334mg  PO TID w/ meals (phosphate binder). Continue Iron Sucrose infusions and dialysis vitamins (B Complex/Folic Acid). Strongly reinforce adherence to low-phosphate diet. Monitor labs closely (CMP, CBC, Mg, Phos ordered 03/14/24). Urgent consultation with Nephrology (Dr. Lydia Sams) to adjust dialysis prescription aiming to mitigate intradialytic hypotension and cramping.

## 2024-03-15 NOTE — Progress Notes (Signed)
 See patient message.  Will order doppler liver and new gastrointestinal referral for the elev LFT. Will defer nephrological plan to his nephrologist and the dialysis team.

## 2024-03-15 NOTE — Assessment & Plan Note (Signed)
 Prediabetes / Hyperglycemia: Assessment: Random glucose 152, HbA1c 6.1%. Sitagliptin recently discontinued (was used off-label). Plan:  Check HbA1c (ordered today). Continue Lifestyle/Dietary management as appropriate within renal diet restrictions. No current indication for pharmacologic therapy for glucose control based on A1c.

## 2024-03-15 NOTE — Telephone Encounter (Signed)
 Verbally made Dr Boston Byers aware, per Dr Boston Byers, this pt is a dialysis patient and he is aware of Creatinine and GFR levels will be elevated/critical

## 2024-03-15 NOTE — Assessment & Plan Note (Signed)
 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 Anemia also present. Likely contributes to hematologic abnormalities alongside CKD/Liver disease. Plan:  Continue Apixaban (Eliquis) 5mg  PO BID for Atrial Flutter (risk/benefit assessed in context of thrombocytopenia). Monitor CBC periodically (ordered today). Hematology follow-up as previously established/indicated.

## 2024-03-15 NOTE — Telephone Encounter (Signed)
 CRITICAL VALUE STICKER  CRITICAL VALUE: Creatinine-10.96, GFR-4.94  RECEIVER (on-site recipient of call): Vihaan Gloss  DATE & TIME NOTIFIED: 03/15/24 @ 11:15am   MESSENGER (representative from lab):[email or web address]  MD NOTIFIED: Dr Boston Byers   TIME OF NOTIFICATION: 11:20am   RESPONSE:

## 2024-03-15 NOTE — Assessment & Plan Note (Signed)
 Mixed Hyperlipidemia: Assessment: Elevated LDL (124), Total Cholesterol (208), Triglycerides (167). Statin intolerant. Plan:  Continue Evolocumab (Repatha) 140mg  SQ every 14 days. Continue Lifestyle/Dietary management. Check lipid panel (ordered today).

## 2024-03-15 NOTE — Assessment & Plan Note (Signed)
 Morbid Obesity (BMI 52.72): Assessment: Significant contributor to multiple comorbidities. Tirzepatide previously discontinued. Plan: Continue counseling on diet/lifestyle within significant constraints of ESRD/other conditions. Consider referral to renal dietitian.

## 2024-03-15 NOTE — Assessment & Plan Note (Signed)
 Post-Dialysis Nausea, Vomiting, and Hypotension: Assessment: Symptoms likely multifactorial, related to fluid shifts and electrolyte imbalances during dialysis (intradialytic hypotension, potential transient hyponatremia or hypocalcemia despite current normal labs), uremia, possible contribution from GERD/stress ulcer, and potential side effects from medications (e.g., amitriptyline). Current BP 110/60, but history of significant drops post-HD. Plan:  Hold Amitriptyline: Assess if discontinuation improves post-dialysis symptoms. Continue Sucralfate: 1g PO QHS for suspected stress ulcer/gastritis. Ondansetron: Continue 4mg  ODT PO Q8H PRN nausea/vomiting. Nephrology Consultation: Urgent communication with Dr. Lydia Sams (Nephrology) recommended to review dialysis prescription (e.g., ultrafiltration rate, sodium/calcium modeling, dialysate buffer) to minimize hypotension and cramping. Monitoring: Encourage checking post-dialysis BP. Consider checking post-dialysis electrolytes (Na, Ca) if symptoms persist despite dialysis adjustments. Diet: Encourage adequate nutritional intake, especially a balanced meal, before dialysis sessions.

## 2024-03-16 NOTE — Telephone Encounter (Signed)
 Called pt and advised results. Pt already scheduled for liver US /doppler for 03/28/24; Gastro referral already placed and advised phone number to call and schedule an appointment. Pt scheduled with me today for office visit to see you next week and repeat labs. If you would like me to place future labs please let me know.

## 2024-03-17 DIAGNOSIS — Z992 Dependence on renal dialysis: Secondary | ICD-10-CM | POA: Diagnosis not present

## 2024-03-17 DIAGNOSIS — N186 End stage renal disease: Secondary | ICD-10-CM | POA: Diagnosis not present

## 2024-03-17 DIAGNOSIS — N2581 Secondary hyperparathyroidism of renal origin: Secondary | ICD-10-CM | POA: Diagnosis not present

## 2024-03-19 DIAGNOSIS — N186 End stage renal disease: Secondary | ICD-10-CM | POA: Diagnosis not present

## 2024-03-19 DIAGNOSIS — Z992 Dependence on renal dialysis: Secondary | ICD-10-CM | POA: Diagnosis not present

## 2024-03-19 DIAGNOSIS — N2581 Secondary hyperparathyroidism of renal origin: Secondary | ICD-10-CM | POA: Diagnosis not present

## 2024-03-21 ENCOUNTER — Encounter

## 2024-03-21 ENCOUNTER — Encounter (HOSPITAL_COMMUNITY)

## 2024-03-22 DIAGNOSIS — L299 Pruritus, unspecified: Secondary | ICD-10-CM | POA: Diagnosis not present

## 2024-03-22 DIAGNOSIS — R52 Pain, unspecified: Secondary | ICD-10-CM | POA: Diagnosis not present

## 2024-03-22 DIAGNOSIS — Z992 Dependence on renal dialysis: Secondary | ICD-10-CM | POA: Diagnosis not present

## 2024-03-22 DIAGNOSIS — N186 End stage renal disease: Secondary | ICD-10-CM | POA: Diagnosis not present

## 2024-03-22 DIAGNOSIS — N2581 Secondary hyperparathyroidism of renal origin: Secondary | ICD-10-CM | POA: Diagnosis not present

## 2024-03-23 ENCOUNTER — Ambulatory Visit (INDEPENDENT_AMBULATORY_CARE_PROVIDER_SITE_OTHER): Admitting: Internal Medicine

## 2024-03-23 ENCOUNTER — Encounter: Payer: Self-pay | Admitting: Internal Medicine

## 2024-03-23 ENCOUNTER — Ambulatory Visit: Admitting: Family

## 2024-03-23 ENCOUNTER — Other Ambulatory Visit: Payer: Self-pay | Admitting: Internal Medicine

## 2024-03-23 VITALS — BP 112/86 | HR 70 | Temp 97.9°F | Wt 373.6 lb

## 2024-03-23 DIAGNOSIS — N186 End stage renal disease: Secondary | ICD-10-CM | POA: Diagnosis not present

## 2024-03-23 DIAGNOSIS — J329 Chronic sinusitis, unspecified: Secondary | ICD-10-CM

## 2024-03-23 DIAGNOSIS — L304 Erythema intertrigo: Secondary | ICD-10-CM | POA: Diagnosis not present

## 2024-03-23 DIAGNOSIS — R1115 Cyclical vomiting syndrome unrelated to migraine: Secondary | ICD-10-CM

## 2024-03-23 DIAGNOSIS — K7400 Hepatic fibrosis, unspecified: Secondary | ICD-10-CM

## 2024-03-23 DIAGNOSIS — K279 Peptic ulcer, site unspecified, unspecified as acute or chronic, without hemorrhage or perforation: Secondary | ICD-10-CM | POA: Diagnosis not present

## 2024-03-23 DIAGNOSIS — R1013 Epigastric pain: Secondary | ICD-10-CM

## 2024-03-23 DIAGNOSIS — Z992 Dependence on renal dialysis: Secondary | ICD-10-CM

## 2024-03-23 MED ORDER — NYSTATIN 100000 UNIT/GM EX POWD: 1.0000 | Freq: Three times a day (TID) | CUTANEOUS | 4 refills | Status: DC

## 2024-03-23 NOTE — Assessment & Plan Note (Signed)
 Cyclical vomiting of unknown cause so we have been keeping on proton pump inhibitor (PPI) stomach acid reducer until that calms down despite kidney disease risk. He sometimes gets some dyspepsis still even with it.

## 2024-03-23 NOTE — Assessment & Plan Note (Signed)
 Chronic sinusitis with recent episodes of sinus drainage and minor bleeding is exacerbated by pollen exposure. Current management includes antibiotics and sinus rinses. Provide a sinus care handout and advise the use of sinus rinses and nasal steroid spray (Flonase ) to manage symptoms.  Currently pollen quite bad likely flaring.

## 2024-03-23 NOTE — Patient Instructions (Addendum)
 VISIT SUMMARY:  [place], you came in today for a follow-up regarding your kidney function and associated symptoms. We discussed your chronic kidney disease, recent vomiting episodes, liver fibrosis, sinus issues, and a yeast infection on your skin.  YOUR PLAN:  -CHRONIC KIDNEY DISEASE: Chronic kidney disease means your kidneys are not working as well as they should. 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 We will continue to coordinate with your nephrologist, Dr. Lydia Sams, to discuss potential treatments, including the use of Tarpeyo and possibly restarting Mounjaro . We will also request your renal biopsy results from Dr. Yvonnie Heritage.  -VOMITING: Your recent vomiting episodes seem to be related to your diet and possibly your kidney function. Avoiding known triggers like cucumbers and adjusting the timing of your blood pressure medication has helped. Continue to avoid foods that cause issues and take your blood pressure medication after dialysis sessions.  -LIVER FIBROSIS: Liver fibrosis means there is some scarring in your liver. We discussed the possibility of resuming Mounjaro  to help with weight loss, pending approval from your nephrologist due to potential kidney risks. This could potentially reduce the need for bariatric surgery.  -SINUSITIS: Sinusitis is the inflammation of your sinuses, which has been causing drainage and minor bleeding. This is likely worsened by pollen exposure. Continue using antibiotics and sinus rinses. I have provided a sinus care handout and recommend using a nasal steroid spray like Flonase  to manage your symptoms.  -YEAST INFECTION OF SKIN: A yeast infection in your skin fold is causing raw, bleeding areas, worsened by blood thinners. You are managing it well with nystatin  powder and gauze. Keep the area dry by using absorbent, breathable fabric and continue using the nystatin  powder.  INSTRUCTIONS:  Please follow up with  Dr. Lydia Sams to discuss the potential use of Tarpeyo and restarting Mounjaro . We will also request your renal biopsy results from Dr. Yvonnie Heritage. Continue to avoid foods that trigger vomiting and take your blood pressure medication after dialysis. Use the sinus care handout provided and start using Flonase . Keep the affected skin area dry and continue using nystatin  powder.   ALLERGY MANAGEMENT PLAN  This plan is designed to help manage your allergic rhinitis (nasal allergies) effectively. Follow these steps daily for best results.  3x daily sinus saline sprays  Saline mist into nose leaning over sink at 45 degrees Once daily, after a sinus rinse, use sensimist.  Bedtime usually best. Once daily for xyzal 5 mg in am Take benadryl 25 mg at bedtime also if allergic mucus is persisting      DAILY TREATMENT ROUTINE   Time of Day Treatment Steps  Morning 1. Saline Nasal Spray - Use to cleanse nasal passages 2. Xyzal (levocetirizine) - Take one tablet daily   Throughout Day Saline Nasal Spray - Use 2 additional times (mid-day and afternoon)   Evening/Bedtime 1. Saline Nasal Rinse - Thoroughly clean nasal passages 2. Flonase  Sensimist - Apply after nasal rinse 3. Benadryl (diphenhydramine) - Take 25mg  if experiencing persistent congestion    PROPER TECHNIQUE GUIDE       Saline Nasal Spray/Rinse Technique: Lean forward over sink at a 45-degree angle Turn head slightly to one side Insert spray tip into upper nostril Spray gently while breathing lightly through your nose Repeat on other side Gently blow nose to clear excess solution Use saline spray 3 times daily to keep nasal passages moist and clear allergens.       Flonase  Sensimist Technique:  Shake bottle gently before each use Prime the bottle if it's new or hasn't been used for a week Tilt your head forward slightly Insert tip into nostril, pointing away from the center of your nose Spray while inhaling gently Repeat in other  nostril Use Flonase  Sensimist once daily, preferably at bedtime after using saline rinse. It may take several days of regular use to feel maximum benefit.   WHY FLONASE  SENSIMIST?   Benefits of Flonase  Sensimist:  Alcohol-free and scent-free formula - gentler on sensitive nasal passages Fine mist application - more comfortable with less dripping down throat Effectively relieves nasal congestion, sneezing, runny nose, and even eye symptoms 24-hour relief with once-daily dosing Uses a more potent form of fluticasone  that works at a lower dose Less liquid per spray means less discomfort  UNDERSTANDING YOUR MEDICATIONS   Medication How It Works Important Notes  Flonase  Sensimist (fluticasone  furoate) Reduces inflammation in nasal passages, addressing the underlying cause of allergy symptoms - Takes several days for full effect - Use daily for best results - Safe for long-term use   Xyzal (levocetirizine) Blocks histamine to reduce allergy symptoms like sneezing and itching - Take at the same time each day - May cause drowsiness in some people - Once-daily dosing   Benadryl (diphenhydramine) Antihistamine that provides additional relief for breakthrough symptoms - Causes drowsiness - Use only at bedtime - For occasional use when needed   Saline Spray/Rinse Physically removes allergens and moistens nasal passages - Safe to use frequently - Improves effectiveness of other treatments - Reduces nasal irritation    CONTACT YOUR PROVIDER IF: Your symptoms do not improve after 1-2 weeks of following this plan You develop sinus pain with fever or green/yellow discharge You experience frequent nosebleeds You develop new or worsening symptoms You have questions about your treatment plan     ADDITIONAL ALLERGY MANAGEMENT TIPS   HELPFUL STRATEGIES: ?? Keep windows closed during high pollen seasons ??? Use allergen-proof covers for pillows and mattresses ?? Vacuum regularly with a HEPA  filter vacuum ?? Shower and change clothes after spending time outdoors ?? Check local pollen counts and limit outdoor time when counts are high ?? Stay well-hydrated to help keep mucous membranes moist

## 2024-03-23 NOTE — Progress Notes (Signed)
 ==============================  [facility]: [phone]   -- Medical Office Visit --  Patient: Sean [name]      Age: 53 y.o.       Sex:  male  Date:   03/23/2024 Today's Healthcare Provider: Anthon Kins, MD  ==============================   Chief Complaint: Medical Management of Chronic Issues (Follow up and labs)  History of Present Illness 53 year old male with kidney disease who presents for follow-up regarding his kidney function and associated symptoms.  He has a raw, bleeding area on his skin fold of abdomen-which he (likely correctly) attributes to a yeast infection. He manages it by washing, rinsing, drying, and applying nystatin  powder. The bleeding is exacerbated by blood thinners, causing significant bleeding and clotting.  He has a history of kidney disease and is currently on dialysis. A previous kidney biopsy was performed in July of last year, but the results are not readily available. He experiences vomiting, particularly after dialysis, which has improved recently. He has been off Mounjaro  due to concerns from his kidney doctor. He experienced significant weight loss with this medication in the past.  He experienced a recent episode of vomiting after consuming cucumbers, which he is known to be intolerant to. The vomiting was severe but resolved after expelling the contents.  He has a history of sinus issues, with recent episodes of coughing up blood, which he attributes to sinus drainage. He has been using antibiotics and reports improvement in his symptoms. He also uses a CPAP machine regularly.  He is currently taking blood thinners, nystatin  powder, and has been adjusting his blood pressure medication timing.  Background Reviewed: Problem List: has History of umbilical hernia repair; Hypertension; Hyperlipidemia; Thrombocytopenia (HCC); Sinus bradycardia; CAD (coronary artery disease); Atrial flutter with rapid  ventricular response (HCC); Morbid obesity (HCC); Other long term (current) drug therapy; History of coronary artery bypass surgery; Obstructive sleep apnea syndrome; Chronic gout of multiple sites; Alkaline phosphatase elevation; Gout; Ectatic aorta (HCC); Gastroesophageal reflux disease without esophagitis; Herpes simplex; Metabolic dysfunction-associated steatohepatitis (MASH); Prediabetes; Statins contraindicated; Hypothyroid; Chronic venous stasis dermatitis of both lower extremities; Nausea and vomiting; Chronic liver failure without hepatic coma (HCC); Splenomegaly; Spondylolisthesis; Bleeding diathesis (HCC); Malaise and fatigue; Allergic rhinitis due to pollen; Amitriptyline  adverse reaction; MGUS (monoclonal gammopathy of unknown significance); 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 Medications:  has a current medication list which includes the following prescription(s): acetaminophen , acetaminophen , allopurinol , amitriptyline , azelastine , dialyvite tablet, calcium  acetate, calcium  carb-cholecalciferol, vitamin d3, cyanocobalamin , diclofenac  sodium, diltiazem , eliquis , repatha  sureclick, pepcid  complete, fluticasone , iron sucrose in sodium chloride  0.9 % 100 mL, metoprolol  tartrate, montelukast , nitroglycerin , nystatin , ondansetron , pantoprazole , polyethylene glycol, sucralfate , triamcinolone , ketoconazole , and tirzepatide , and the following Facility-Administered Medications: technetium tetrofosmin .  Allergies:  is allergic to glucophage  [metformin ], vibra -tab [doxycycline ], chlorhexidine , chlorhexidine  gluconate, augmentin [amoxicillin-pot clavulanate], imdur  [isosorbide  dinitrate], tape, and valacyclovir .  Past Medical History:  has a past  medical history of Abnormal liver enzymes, Acute renal failure superimposed on stage 5 chronic kidney disease, not on chronic dialysis (HCC) (01/08/2024), AKI (acute kidney injury) (HCC) (06/17/2023), Cardiac cirrhosis, Chronic combined systolic and diastolic CHF (congestive heart failure) (HCC), Chronic kidney disease, stage 4 (severe) (HCC) (01/04/2016), CKD (chronic kidney disease) stage 3, GFR 30-59 ml/min (HCC) (01/04/2016), CKD (chronic kidney disease), stage II, Congenital  heart defect, Coronary artery disease, Gastric polyps (11/25/2022), Gastritis and gastroduodenitis (11/25/2022), Gastroesophageal reflux disease with esophagitis and hemorrhage (04/04/2021), Hematuria, History of umbilical hernia repair (07/10/2011), Hypercholesteremia, Hypertension, Incisional hernia (07/10/2011), Morbid obesity (HCC), Morbid obesity with BMI of 50.0-59.9, adult (HCC) (01/04/2016), Nausea and vomiting (11/25/2022), OSA on CPAP, Paroxysmal atrial flutter (HCC) (02/12/2016), 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 Past Surgical History:   has a past surgical history that includes Gallbladder surgery; Appendectomy; Hernia repair; Cardiac catheterization (N/A, 12/24/2015); Coronary artery bypass graft (N/A, 12/26/2015); TEE without cardioversion (N/A, 12/26/2015); Cardioversion (N/A, 02/14/2016); TEE without cardioversion (N/A, 02/14/2016); LEFT HEART CATH AND CORS/GRAFTS ANGIOGRAPHY (N/A, 04/15/2017); LEFT HEART CATH AND CORS/GRAFTS ANGIOGRAPHY (N/A, 11/02/2020); Colonoscopy with propofol  (N/A, 02/19/2021); Esophagogastroduodenoscopy (egd) with propofol  (N/A, 02/19/2021); biopsy (02/19/2021); polypectomy (02/19/2021); Esophagogastroduodenoscopy (egd) with propofol  (N/A, 11/25/2022); biopsy (11/25/2022); IR Transcatheter BX (06/26/2023); IR US  Guide Vasc Access Right (06/26/2023); IR Venogram Hepatic W Hemodynamic Evaluation (06/26/2023); AV fistula placement (Left, 01/11/2024); and Insertion of  dialysis catheter (Right, 01/11/2024). Social History:   reports that he has never smoked. He has never used smokeless tobacco. He reports current alcohol use. He reports that he does not use drugs. Family History:  family history includes CAD in his father; Diabetes in his father, maternal grandfather, and maternal uncle; Heart attack in an other family member; Hyperlipidemia in an other family member. Depression Screen and Health Maintenance:    02/12/2024    1:08 PM 02/05/2024    8:26 AM 11/02/2023   10:55 AM 05/04/2023   10:23 AM  PHQ 2/9 Scores  PHQ - 2 Score 0 0 2 0  PHQ- 9 Score   8     Medication Reconciliation: Current Outpatient Medications on File Prior to Visit  Medication Sig   acetaminophen  (TYLENOL ) 500 MG tablet Take 1,500 mg by mouth once as needed for moderate pain (pain score 4-6), fever, headache or mild pain (pain score 1-3).   Acetaminophen  500 MG PACK Take by mouth.   allopurinol  (ZYLOPRIM ) 100 MG tablet Take 1 tablet (100 mg total) by mouth daily.   amitriptyline  (ELAVIL ) 25 MG tablet Take 1 tablet (25 mg total) by mouth at bedtime. (Patient taking differently: Take 25 mg by mouth at bedtime.)   azelastine  (ASTELIN ) 0.1 % nasal spray Place 2 sprays into both nostrils 2 (two) times daily. Use in each nostril as directed (Patient taking differently: Place 2 sprays into both nostrils as needed for allergies. Use in each nostril as directed)   B Complex-C-Folic Acid  (DIALYVITE TABLET) TABS Take by mouth.   calcium  acetate (PHOSLO ) 667 MG capsule Take 2 capsules (1,334 mg total) by mouth 3 (three) times daily with meals.   Calcium  Carb-Cholecalciferol 600-25 MG-MCG CAPS Take by mouth.   Cholecalciferol (VITAMIN D3) 125 MCG (5000 UT) CAPS Take 1 capsule (5,000 Units total) by mouth daily at 12 noon.   Cyanocobalamin  (VITAMIN B-12 PO) Place 1 tablet under the tongue daily.   diclofenac  Sodium (VOLTAREN ) 1 % GEL Apply 2 g topically 4 (four) times daily for 7 days, THEN 2 g 4  (four) times daily as needed.   diltiazem  (CARDIZEM  SR) 120 MG 12 hr capsule TAKE 1 CAPSULE(120 MG) BY MOUTH TWICE DAILY   ELIQUIS  5 MG TABS tablet TAKE 1 TABLET(5 MG) BY MOUTH TWICE DAILY   Evolocumab  (REPATHA  SURECLICK) 140 MG/ML SOAJ INJECT THE CONTENTS OF 1 SYRINGE UNDER THE SKIN EVERY 14 DAYS   famotidine -calcium  carbonate-magnesium  hydroxide (PEPCID  COMPLETE) 10-800-165 MG chewable tablet  Chew 1 tablet by mouth 2 (two) times daily as needed.   fluticasone  (FLONASE ) 50 MCG/ACT nasal spray Place 2 sprays into both nostrils daily.   iron sucrose in sodium chloride  0.9 % 100 mL Iron Sucrose (Venofer)   metoprolol  tartrate (LOPRESSOR ) 25 MG tablet Take 1 tablet (25 mg total) by mouth 2 (two) times daily.   montelukast  (SINGULAIR ) 10 MG tablet TAKE 1 TABLET(10 MG) BY MOUTH DAILY (Patient taking differently: Take 10 mg by mouth daily.)   nitroGLYCERIN  (NITROSTAT ) 0.4 MG SL tablet Place 1 tablet (0.4 mg total) under the tongue every 5 (five) minutes as needed for chest pain.   ondansetron  (ZOFRAN -ODT) 4 MG disintegrating tablet Take 1 tablet (4 mg total) by mouth every 8 (eight) hours as needed for nausea or vomiting.   polyethylene glycol (MIRALAX  / GLYCOLAX ) 17 g packet Mix 17 grams (1 packet) in 8 ounces of fluid and take by mouth daily. (Patient taking differently: Take 17 g by mouth daily as needed for moderate constipation.)   sucralfate  (CARAFATE ) 1 g tablet Take 1 tablet (1 g total) by mouth at bedtime.   triamcinolone  (NASACORT ) 55 MCG/ACT AERO nasal inhaler Place 1 spray into the nose daily. Start with 1 spray each side twice a day for 3 days, then reduce to daily.   ketoconazole  (NIZORAL ) 2 % cream Apply 1 Application topically 2 (two) times daily. (Patient not taking: Reported on 03/14/2024)   tirzepatide  (MOUNJARO ) 2.5 MG/0.5ML Pen Inject 2.5 mg into the skin once a week. (Patient not taking: Reported on 03/23/2024)   Current Facility-Administered Medications on File Prior to Visit   Medication   technetium tetrofosmin  (TC-MYOVIEW ) injection 29.8 millicurie  There are no discontinued medications.      Physical Exam:    03/23/2024    9:32 AM 03/14/2024    1:56 PM 02/12/2024    1:01 PM  Vitals with BMI  Height  5\' 11"  5\' 11"   Weight 373 lbs 10 oz 377 lbs 13 oz 375 lbs  BMI 52.13 52.72 52.33  Systolic 112 110 604  Diastolic 86 60 76  Pulse 70 76 55   Wt Readings from Last 10 Encounters:  03/23/24 (!) 373 lb 9.6 oz (169.5 kg)  03/14/24 (!) 377 lb 12.8 oz (171.4 kg)  02/12/24 (!) 375 lb (170.1 kg)  02/05/24 (!) 375 lb 3.2 oz (170.2 kg)  02/01/24 (!) 381 lb 3.2 oz (172.9 kg)  01/29/24 (!) 381 lb 4.8 oz (173 kg)  01/16/24 (!) 382 lb 0.9 oz (173.3 kg)  01/05/24 (!) 400 lb (181.4 kg)  01/01/24 (!) 400 lb 9.6 oz (181.7 kg)  12/14/23 (!) 387 lb 9.6 oz (175.8 kg)  Vital signs reviewed.  Nursing notes reviewed. Weight trend reviewed. Physical Exam  Physical Exam ABDOMEN: Abdomen normal on inspection, soft, non-tender, non-distended, without organomegaly, normal bowel sounds.  General Appearance:  No acute distress appreciable.   Well-groomed, healthy-appearing male.  Well proportioned with no abnormal fat distribution.  Good muscle tone. Pulmonary:  Normal work of breathing at rest, no respiratory distress apparent. SpO2: 98 %  Musculoskeletal: All extremities are intact.  Neurological:  Awake, alert, oriented, and engaged.  No obvious focal neurological deficits or cognitive impairments.  Sensorium seems unclouded.   Speech is clear and coherent with logical content. Psychiatric:  Appropriate mood, pleasant and cooperative demeanor, thoughtful and engaged during the exam    No results found for any visits on 03/23/24. Office Visit on 03/14/2024  Component Date Value   Cholesterol  03/14/2024 208 (H)    Triglycerides 03/14/2024 167.0 (H)    HDL 03/14/2024 50.60    VLDL 03/14/2024 33.4    LDL Cholesterol 03/14/2024 124 (H)    Total CHOL/HDL Ratio 03/14/2024 4     NonHDL 03/14/2024 157.38    Sodium 03/14/2024 137    Potassium 03/14/2024 4.3    Chloride 03/14/2024 98    CO2 03/14/2024 22    Glucose, Bld 03/14/2024 152 (H)    BUN 03/14/2024 69 (H)    Creatinine, Ser 03/14/2024 10.96 (HH)    Total Bilirubin 03/14/2024 1.6 (H)    Alkaline Phosphatase 03/14/2024 244 (H)    AST 03/14/2024 55 (H)    ALT 03/14/2024 64 (H)    Total Protein 03/14/2024 6.1    Albumin  03/14/2024 3.3 (L)    GFR 03/14/2024 4.94 (LL)    Calcium  03/14/2024 9.2    WBC 03/14/2024 7.8    RBC 03/14/2024 3.07 (L)    Hemoglobin 03/14/2024 11.0 (L)    HCT 03/14/2024 32.0 (L)    MCV 03/14/2024 104.2 (H)    MCHC 03/14/2024 34.4    RDW 03/14/2024 15.9 (H)    Platelets 03/14/2024 89.0 (L)    Neutrophils Relative % 03/14/2024 59.4    Lymphocytes Relative 03/14/2024 17.6    Monocytes Relative 03/14/2024 16.0 (H)    Eosinophils Relative 03/14/2024 6.6 (H)    Basophils Relative 03/14/2024 0.4    Neutro Abs 03/14/2024 4.6    Lymphs Abs 03/14/2024 1.4    Monocytes Absolute 03/14/2024 1.2 (H)    Eosinophils Absolute 03/14/2024 0.5    Basophils Absolute 03/14/2024 0.0    TSH 03/14/2024 2.070    Hgb A1c MFr Bld 03/14/2024 6.1    Magnesium  03/14/2024 2.4    Phosphorus 03/14/2024 8.1 (H)   Appointment on 01/18/2024  Component Date Value   WBC Count 01/18/2024 7.4    RBC 01/18/2024 3.33 (L)    Hemoglobin 01/18/2024 11.4 (L)    HCT 01/18/2024 33.4 (L)    MCV 01/18/2024 100.3 (H)    MCH 01/18/2024 34.2 (H)    MCHC 01/18/2024 34.1    RDW 01/18/2024 14.8    Platelet Count 01/18/2024 70 (L)    nRBC 01/18/2024 0.00    Neutrophils Relative % 01/18/2024 48    Neutro Abs 01/18/2024 3.6    Lymphocytes Relative 01/18/2024 26    Lymphs Abs 01/18/2024 1.9    Monocytes Relative 01/18/2024 16    Monocytes Absolute 01/18/2024 1.2 (H)    Eosinophils Relative 01/18/2024 8    Eosinophils Absolute 01/18/2024 0.6 (H)    Basophils Relative 01/18/2024 1    Basophils Absolute 01/18/2024 0.1     Immature Granulocytes 01/18/2024 1    Abs Immature Granulocytes 01/18/2024 0.07    nRBC 01/18/2024 0    Immature Platelet Fracti* 01/18/2024 0.3 (L)    Ferritin 01/18/2024 114    Iron 01/18/2024 152    TIBC 01/18/2024 456 (H)    Saturation Ratios 01/18/2024 33    UIBC 01/18/2024 304    Vitamin B-12 01/18/2024 6,881 (H)    IgG (Immunoglobin G), Se* 01/18/2024 561 (L)    IgA 01/18/2024 1,203 (H)    IgM (Immunoglobulin M), * 01/18/2024 91    Total Protein ELP 01/18/2024 5.9 (L)    Albumin  SerPl Elph-Mcnc 01/18/2024 2.8 (L)    Alpha 1 01/18/2024 0.3    Alpha2 Glob SerPl Elph-M* 01/18/2024 0.6    B-Globulin SerPl Elph-Mc* 01/18/2024 1.7 (H)    Gamma Glob SerPl  Elph-Mc* 01/18/2024 0.4    M Protein SerPl Elph-Mcnc 01/18/2024 0.9 (H)    Globulin, Total 01/18/2024 3.1    Albumin /Glob SerPl 01/18/2024 1.0    IFE 1 01/18/2024 Comment (A)    Please Note 01/18/2024 Comment    Kappa free light chain 01/18/2024 1,099.9 (H)    Lambda free light chains 01/18/2024 33.2 (H)    Kappa, lambda light chai* 01/18/2024 33.13 (H)   No results displayed because visit has over 200 results.    Admission on 01/05/2024, Discharged on 01/05/2024  Component Date Value   WBC 01/05/2024 6.5    RBC 01/05/2024 3.17 (L)    Hemoglobin 01/05/2024 10.8 (L)    HCT 01/05/2024 32.8 (L)    MCV 01/05/2024 103.5 (H)    MCH 01/05/2024 34.1 (H)    MCHC 01/05/2024 32.9    RDW 01/05/2024 14.2    Platelets 01/05/2024 66 (L)    nRBC 01/05/2024 0.0    Neutrophils Relative % 01/05/2024 54    Neutro Abs 01/05/2024 3.6    Lymphocytes Relative 01/05/2024 22    Lymphs Abs 01/05/2024 1.4    Monocytes Relative 01/05/2024 12    Monocytes Absolute 01/05/2024 0.8    Eosinophils Relative 01/05/2024 10    Eosinophils Absolute 01/05/2024 0.6 (H)    Basophils Relative 01/05/2024 1    Basophils Absolute 01/05/2024 0.1    Immature Granulocytes 01/05/2024 1    Abs Immature Granulocytes 01/05/2024 0.05    Sodium 01/05/2024 142     Potassium 01/05/2024 3.9    Chloride 01/05/2024 107    CO2 01/05/2024 24    Glucose, Bld 01/05/2024 151 (H)    BUN 01/05/2024 57 (H)    Creatinine, Ser 01/05/2024 6.95 (H)    Calcium  01/05/2024 8.9    Total Protein 01/05/2024 5.9 (L)    Albumin  01/05/2024 2.4 (L)    AST 01/05/2024 27    ALT 01/05/2024 26    Alkaline Phosphatase 01/05/2024 140 (H)    Total Bilirubin 01/05/2024 0.7    GFR, Estimated 01/05/2024 9 (L)    Anion gap 01/05/2024 11    Magnesium  01/05/2024 2.4    B Natriuretic Peptide 01/05/2024 430.6 (H)    SARS Coronavirus 2 by RT* 01/05/2024 NEGATIVE    Influenza A by PCR 01/05/2024 NEGATIVE    Influenza B by PCR 01/05/2024 NEGATIVE    Resp Syncytial Virus by * 01/05/2024 NEGATIVE   Scanned Document on 12/28/2023  Component Date Value   PTH 12/23/2023 177    Calcium  12/23/2023 9.1    EGFR 12/23/2023 10.0   Office Visit on 12/14/2023  Component Date Value   WBC 12/14/2023 6.3    RBC 12/14/2023 3.69 (L)    Hemoglobin 12/14/2023 12.6 (L)    HCT 12/14/2023 37.0 (L)    MCV 12/14/2023 100.3 (H)    MCHC 12/14/2023 34.0    RDW 12/14/2023 14.3    Platelets 12/14/2023 70.0 (L)    Neutrophils Relative % 12/14/2023 67.3    Lymphocytes Relative 12/14/2023 16.1    Monocytes Relative 12/14/2023 11.9    Eosinophils Relative 12/14/2023 3.9    Basophils Relative 12/14/2023 0.8    Neutro Abs 12/14/2023 4.2    Lymphs Abs 12/14/2023 1.0    Monocytes Absolute 12/14/2023 0.8    Eosinophils Absolute 12/14/2023 0.2    Basophils Absolute 12/14/2023 0.0    Sodium 12/14/2023 143    Potassium 12/14/2023 3.5    Chloride 12/14/2023 107    CO2 12/14/2023 26    Glucose,  Bld 12/14/2023 110 (H)    BUN 12/14/2023 45 (H)    Creatinine, Ser 12/14/2023 5.98 (HH)    Total Bilirubin 12/14/2023 0.9    Alkaline Phosphatase 12/14/2023 171 (H)    AST 12/14/2023 37    ALT 12/14/2023 39    Total Protein 12/14/2023 6.6    Albumin  12/14/2023 3.3 (L)    GFR 12/14/2023 10.23 (LL)    Calcium   12/14/2023 9.3    Color, Urine 12/14/2023 YELLOW    APPearance 12/14/2023 CLEAR    Specific Gravity, Urine 12/14/2023 1.020    pH 12/14/2023 6.5    Total Protein, Urine 12/14/2023 >=300 (A)    Urine Glucose 12/14/2023 NEGATIVE    Ketones, ur 12/14/2023 NEGATIVE    Bilirubin Urine 12/14/2023 NEGATIVE    Hgb urine dipstick 12/14/2023 SMALL (A)    Urobilinogen, UA 12/14/2023 0.2    Leukocytes,Ua 12/14/2023 NEGATIVE    Nitrite 12/14/2023 NEGATIVE    WBC, UA 12/14/2023 0-2/hpf    RBC / HPF 12/14/2023 3-6/hpf (A)    Squamous Epithelial / HPF 12/14/2023 Rare(0-4/hpf)   Office Visit on 12/01/2023  Component Date Value   WBC 12/01/2023 5.8    RBC 12/01/2023 3.63 (L)    Hemoglobin 12/01/2023 12.6 (L)    HCT 12/01/2023 36.1 (L)    MCV 12/01/2023 99.6    MCHC 12/01/2023 34.8    RDW 12/01/2023 14.9    Platelets 12/01/2023 67.0 (L)    Neutrophils Relative % 12/01/2023 56.2    Lymphocytes Relative 12/01/2023 23.7    Monocytes Relative 12/01/2023 13.0 (H)    Eosinophils Relative 12/01/2023 6.2 (H)    Basophils Relative 12/01/2023 0.9    Neutro Abs 12/01/2023 3.2    Lymphs Abs 12/01/2023 1.4    Monocytes Absolute 12/01/2023 0.8    Eosinophils Absolute 12/01/2023 0.4    Basophils Absolute 12/01/2023 0.0    Sodium 12/01/2023 140    Potassium 12/01/2023 3.5    Chloride 12/01/2023 104    CO2 12/01/2023 25    Glucose, Bld 12/01/2023 96    BUN 12/01/2023 50 (H)    Creatinine, Ser 12/01/2023 5.78 (HH)    Total Bilirubin 12/01/2023 0.8    Alkaline Phosphatase 12/01/2023 178 (H)    AST 12/01/2023 34    ALT 12/01/2023 44    Total Protein 12/01/2023 6.7    Albumin  12/01/2023 3.5    GFR 12/01/2023 10.66 (LL)    Calcium  12/01/2023 9.3    TSH W/REFLEX TO FT4 12/01/2023 3.83    Color, Urine 12/01/2023 YELLOW    APPearance 12/01/2023 CLEAR    Specific Gravity, Urine 12/01/2023 1.015    pH 12/01/2023 6.0    Total Protein, Urine 12/01/2023 100 (A)    Urine Glucose 12/01/2023 250 (A)     Ketones, ur 12/01/2023 NEGATIVE    Bilirubin Urine 12/01/2023 NEGATIVE    Hgb urine dipstick 12/01/2023 SMALL (A)    Urobilinogen, UA 12/01/2023 0.2    Leukocytes,Ua 12/01/2023 NEGATIVE    Nitrite 12/01/2023 NEGATIVE    WBC, UA 12/01/2023 0-2/hpf    RBC / HPF 12/01/2023 0-2/hpf    Squamous Epithelial / HPF 12/01/2023 Rare(0-4/hpf)    POC Glucose 12/01/2023 116 (A)   Office Visit on 11/09/2023  Component Date Value   WBC 11/09/2023 4.8    RBC 11/09/2023 3.42 (L)    Hemoglobin 11/09/2023 11.7 (L)    HCT 11/09/2023 34.3 (L)    MCV 11/09/2023 100.1 (H)    MCHC 11/09/2023 34.2    RDW 11/09/2023 15.3  Platelets 11/09/2023 66.0 (L)    Neutrophils Relative % 11/09/2023 56.8    Lymphocytes Relative 11/09/2023 25.4    Monocytes Relative 11/09/2023 11.2    Eosinophils Relative 11/09/2023 5.5 (H)    Basophils Relative 11/09/2023 1.1    Neutro Abs 11/09/2023 2.7    Lymphs Abs 11/09/2023 1.2    Monocytes Absolute 11/09/2023 0.5    Eosinophils Absolute 11/09/2023 0.3    Basophils Absolute 11/09/2023 0.1    Sodium 11/09/2023 141    Potassium 11/09/2023 3.9    Chloride 11/09/2023 107    CO2 11/09/2023 23    Glucose, Bld 11/09/2023 159 (H)    BUN 11/09/2023 66 (H)    Creatinine, Ser 11/09/2023 5.04 (HH)    Total Bilirubin 11/09/2023 0.7    Alkaline Phosphatase 11/09/2023 199 (H)    AST 11/09/2023 35    ALT 11/09/2023 45    Total Protein 11/09/2023 6.8    Albumin  11/09/2023 3.3 (L)    GFR 11/09/2023 12.57 (LL)    Calcium  11/09/2023 8.9    Creatinine, Urine 11/09/2023 42    Protein/Creat Ratio 11/09/2023 2,762 (H)    Protein/Creatinine Ratio 11/09/2023 2.762 (H)    Total Protein, Urine 11/09/2023 116 (H)    Color, Urine 11/09/2023 YELLOW    APPearance 11/09/2023 CLEAR    Specific Gravity, Urine 11/09/2023 1.015    pH 11/09/2023 6.0    Total Protein, Urine 11/09/2023 100 (A)    Urine Glucose 11/09/2023 >=1000 (A)    Ketones, ur 11/09/2023 NEGATIVE    Bilirubin Urine 11/09/2023  NEGATIVE    Hgb urine dipstick 11/09/2023 SMALL (A)    Urobilinogen, UA 11/09/2023 0.2    Leukocytes,Ua 11/09/2023 NEGATIVE    Nitrite 11/09/2023 NEGATIVE    WBC, UA 11/09/2023 0-2/hpf    RBC / HPF 11/09/2023 0-2/hpf    Mucus, UA 11/09/2023 Presence of (A)    Squamous Epithelial / HPF 11/09/2023 Rare(0-4/hpf)    Amorphous 11/09/2023 Present (A)   Admission on 11/02/2023, Discharged on 11/04/2023  Component Date Value   Sodium 11/02/2023 140    Potassium 11/02/2023 3.9    Chloride 11/02/2023 106    CO2 11/02/2023 25    Glucose, Bld 11/02/2023 170 (H)    BUN 11/02/2023 70 (H)    Creatinine, Ser 11/02/2023 5.63 (H)    Calcium  11/02/2023 9.1    Total Protein 11/02/2023 7.1    Albumin  11/02/2023 2.8 (L)    AST 11/02/2023 57 (H)    ALT 11/02/2023 71 (H)    Alkaline Phosphatase 11/02/2023 188 (H)    Total Bilirubin 11/02/2023 0.7    GFR, Estimated 11/02/2023 12 (L)    Anion gap 11/02/2023 9    WBC 11/02/2023 5.3    RBC 11/02/2023 3.49 (L)    Hemoglobin 11/02/2023 11.3 (L)    HCT 11/02/2023 35.8 (L)    MCV 11/02/2023 102.6 (H)    MCH 11/02/2023 32.4    MCHC 11/02/2023 31.6    RDW 11/02/2023 14.6    Platelets 11/02/2023 54 (L)    nRBC 11/02/2023 0.0    ABO/RH(D) 11/02/2023 A POS    Antibody Screen 11/02/2023 NEG    Sample Expiration 11/02/2023                     Value:11/05/2023,2359 Performed at [name], [address], [place], [place] [address]    Fecal Occult Bld 11/02/2023 NEGATIVE    Troponin I (High Sensiti* 11/02/2023 3    B Natriuretic Peptide  11/02/2023 25.4    Hemoglobin 11/02/2023 11.6 (L)    HCT 11/02/2023 35.9 (L)    Glucose-Capillary 11/02/2023 123 (H)    WBC 11/03/2023 5.1    RBC 11/03/2023 3.37 (L)    Hemoglobin 11/03/2023 11.3 (L)    HCT 11/03/2023 35.3 (L)    MCV 11/03/2023 104.7 (H)    MCH 11/03/2023 33.5    MCHC 11/03/2023 32.0    RDW 11/03/2023 14.8    Platelets 11/03/2023 56 (L)    nRBC 11/03/2023 0.0    Sodium 11/03/2023 143     Potassium 11/03/2023 3.7    Chloride 11/03/2023 111    CO2 11/03/2023 23    Glucose, Bld 11/03/2023 140 (H)    BUN 11/03/2023 65 (H)    Creatinine, Ser 11/03/2023 5.44 (H)    Calcium  11/03/2023 8.7 (L)    Total Protein 11/03/2023 6.3 (L)    Albumin  11/03/2023 2.6 (L)    AST 11/03/2023 53 (H)    ALT 11/03/2023 71 (H)    Alkaline Phosphatase 11/03/2023 185 (H)    Total Bilirubin 11/03/2023 0.6    GFR, Estimated 11/03/2023 12 (L)    Anion gap 11/03/2023 9    Magnesium  11/03/2023 2.3    Phosphorus 11/03/2023 4.9 (H)    Glucose-Capillary 11/03/2023 111 (H)    Glucose-Capillary 11/03/2023 98    Color, Urine 11/03/2023 YELLOW    APPearance 11/03/2023 CLEAR    Specific Gravity, Urine 11/03/2023 1.011    pH 11/03/2023 6.0    Glucose, UA 11/03/2023 >=500 (A)    Hgb urine dipstick 11/03/2023 NEGATIVE    Bilirubin Urine 11/03/2023 NEGATIVE    Ketones, ur 11/03/2023 NEGATIVE    Protein, ur 11/03/2023 100 (A)    Nitrite 11/03/2023 NEGATIVE    Leukocytes,Ua 11/03/2023 NEGATIVE    RBC / HPF 11/03/2023 0-5    WBC, UA 11/03/2023 0-5    Bacteria, UA 11/03/2023 NONE SEEN    Squamous Epithelial / HPF 11/03/2023 0-5    Glucose-Capillary 11/03/2023 105 (H)    WBC 11/04/2023 4.9    RBC 11/04/2023 3.15 (L)    Hemoglobin 11/04/2023 10.2 (L)    HCT 11/04/2023 31.6 (L)    MCV 11/04/2023 100.3 (H)    MCH 11/04/2023 32.4    MCHC 11/04/2023 32.3    RDW 11/04/2023 14.8    Platelets 11/04/2023 54 (L)    nRBC 11/04/2023 0.0    Magnesium  11/04/2023 2.2    Sodium 11/04/2023 141    Potassium 11/04/2023 4.0    Chloride 11/04/2023 111    CO2 11/04/2023 22    Glucose, Bld 11/04/2023 132 (H)    BUN 11/04/2023 62 (H)    Creatinine, Ser 11/04/2023 5.25 (H)    Calcium  11/04/2023 8.3 (L)    Phosphorus 11/04/2023 4.8 (H)    Albumin  11/04/2023 2.5 (L)    GFR, Estimated 11/04/2023 13 (L)    Anion gap 11/04/2023 8    Glucose-Capillary 11/03/2023 167 (H)    Glucose-Capillary 11/04/2023 153 (H)   Scanned  Document on 11/02/2023  Component Date Value   EGFR 10/23/2023 16.0   There may be more visits with results that are not included.  No image results found. DG Shoulder Right Result Date: 01/14/2024 CLINICAL DATA:  Shoulder pain EXAM: RIGHT SHOULDER - 2+ VIEW COMPARISON:  None Available. FINDINGS: There is no evidence of fracture or dislocation. There is no evidence of arthropathy or other focal bone abnormality. Soft tissues are unremarkable. IMPRESSION: Negative. Electronically Signed   By: Burdette Carolin  Cyndy Dresser M.D.   On: 01/14/2024 00:10   DG CHEST PORT 1 VIEW Result Date: 01/11/2024 CLINICAL DATA:  Vascular dialysis catheter placement. EXAM: PORTABLE CHEST 1 VIEW COMPARISON:  01/08/2024 FINDINGS: Catheter placed from a right internal jugular approach. Tip is either at the SVC RA junction or at the proximal right atrium. No pneumothorax. IMPRESSION: Catheter placed from a right internal jugular approach. Tip is either at the SVC RA junction or at the proximal right atrium. No pneumothorax. Electronically Signed   By: Bettylou Brunner M.D.   On: 01/11/2024 17:33   DG C-Arm 1-60 Min Result Date: 01/11/2024 CLINICAL DATA:  Tunneled dialysis catheter insertion EXAM: DG C-ARM 1-60 MIN COMPARISON:  None Available. FINDINGS: 2 fluoroscopic images are obtained during performance of the procedure and are provided for interpretation only. Right-sided dialysis catheter is identified via internal jugular approach, tip overlying atriocaval junction. Please refer to operative report. Fluoroscopy time: 4 minutes 17 seconds, 59.34 mGy IMPRESSION: 1. Intraoperative exam as above. Electronically Signed   By: Bobbye Burrow M.D.   On: 01/11/2024 15:31   VAS US  UPPER EXT VEIN MAPPING (PRE-OP  AVF) Result Date: 01/10/2024 UPPER EXTREMITY VEIN MAPPING Patient Name:  Sean [name]  Date of Exam:   01/09/2024 Medical Rec #: [id number]            Accession #:    [id number] Date of Birth: 07-30-1971            Patient Gender: M Patient  Age:   80 years Exam Location:  [facility] Procedure:      VAS US  UPPER EXT VEIN MAPPING (PRE-OP  AVF) Referring Phys: JAY PATEL --------------------------------------------------------------------------------  Indications: Pre-access. History: CKD /ESRD.  Comparison Study: No prior study on file Performing Technologist: Carleene Chase RVS  Examination Guidelines: A complete evaluation includes B-mode imaging, spectral Doppler, color Doppler, and power Doppler as needed of all accessible portions of each vessel. Bilateral testing is considered an integral part of a complete examination. Limited examinations for reoccurring indications may be performed as noted. +-----------------+-------------+----------+---------+ Right Cephalic   Diameter (cm)Depth (cm)Findings  +-----------------+-------------+----------+---------+ Prox upper arm       0.47        0.63             +-----------------+-------------+----------+---------+ Mid upper arm        0.47        0.41             +-----------------+-------------+----------+---------+ Dist upper arm       0.51        0.43             +-----------------+-------------+----------+---------+ Antecubital fossa    0.60        0.31             +-----------------+-------------+----------+---------+ Prox forearm       0.39/0.35  0.33/0.78 branching +-----------------+-------------+----------+---------+ Mid forearm          0.46        0.46             +-----------------+-------------+----------+---------+ Dist forearm         0.38        0.39             +-----------------+-------------+----------+---------+ Wrist                0.30        0.53             +-----------------+-------------+----------+---------+ +-----------------+-------------+----------+---------+ Right Basilic  Diameter (cm)Depth (cm)Findings  +-----------------+-------------+----------+---------+ Prox upper arm       0.65        2.11    origin    +-----------------+-------------+----------+---------+ Mid upper arm        0.52        1.18             +-----------------+-------------+----------+---------+ Dist upper arm       0.40        0.71             +-----------------+-------------+----------+---------+ Antecubital fossa    0.32        0.36             +-----------------+-------------+----------+---------+ Prox forearm         0.34        0.29             +-----------------+-------------+----------+---------+ Mid forearm        0.35/0.25  0.36/0.24 branching +-----------------+-------------+----------+---------+ Distal forearm       0.21        0.42             +-----------------+-------------+----------+---------+ Wrist                0.37        0.58             +-----------------+-------------+----------+---------+ +-----------------+-------------+----------+---------+ Left Cephalic    Diameter (cm)Depth (cm)Findings  +-----------------+-------------+----------+---------+ Prox upper arm       0.65        0.47             +-----------------+-------------+----------+---------+ Mid upper arm        0.48        0.55             +-----------------+-------------+----------+---------+ Dist upper arm       0.63        0.48             +-----------------+-------------+----------+---------+ Antecubital fossa    0.79        0.45             +-----------------+-------------+----------+---------+ Prox forearm       0.49/0.45  0.70/0.62 branching +-----------------+-------------+----------+---------+ Mid forearm          0.42        0.46             +-----------------+-------------+----------+---------+ Dist forearm         0.38        0.46             +-----------------+-------------+----------+---------+ Wrist                0.37        0.58             +-----------------+-------------+----------+---------+ +-----------------+-------------+----------+--------+ Left Basilic      Diameter (cm)Depth (cm)Findings +-----------------+-------------+----------+--------+ Prox upper arm       0.54        0.17    origin  +-----------------+-------------+----------+--------+ Mid upper arm        0.76        0.98            +-----------------+-------------+----------+--------+ Dist upper arm       0.42        0.97            +-----------------+-------------+----------+--------+ Antecubital fossa    0.57        1.22            +-----------------+-------------+----------+--------+  Prox forearm         0.40        0.55            +-----------------+-------------+----------+--------+ Mid forearm          0.21        0.40            +-----------------+-------------+----------+--------+ Distal forearm       0.15        0.25            +-----------------+-------------+----------+--------+ Wrist                0.17        0.38            +-----------------+-------------+----------+--------+ *See table(s) above for measurements and observations.  Diagnosing physician: Irvin Mantel Electronically signed by Irvin Mantel on 01/10/2024 at 10:26:10 AM.    Final    US  RENAL Result Date: 01/09/2024 CLINICAL DATA:  Acute kidney injury EXAM: RENAL / URINARY TRACT ULTRASOUND COMPLETE COMPARISON:  CT abdomen pelvis 11/02/2023 FINDINGS: Right Kidney: Not well visualized due to adjacent shadowing bowel gas. Left Kidney: Not well visualized due to adjacent shadowing bowel gas. Bladder: Appears normal for degree of bladder distention. Other: None. IMPRESSION: Nonvisualization of the kidneys due to patient body habitus and adjacent shadowing bowel gas. Electronically Signed   By: Elester Grim M.D.   On: 01/09/2024 12:44   DG Chest Port 1 View Result Date: 01/08/2024 CLINICAL DATA:  Shortness of breath EXAM: PORTABLE CHEST 1 VIEW COMPARISON:  Chest x-ray 01/05/2024 FINDINGS: The cardiac silhouette is enlarged, unchanged. Sternotomy wires are present. There is no focal lung  infiltrate, pleural effusion or pneumothorax. There stable elevation of the left hemidiaphragm. No acute fractures are seen. IMPRESSION: 1. No active disease. 2. Stable cardiomegaly. Electronically Signed   By: Tyron Gallon M.D.   On: 01/08/2024 19:12   DG Chest 2 View Result Date: 01/05/2024 CLINICAL DATA:  Shortness of breath EXAM: CHEST - 2 VIEW COMPARISON:  Chest radiograph dated 11/09/2023 FINDINGS: Unchanged asymmetric elevation of the left hemidiaphragm. Normal lung volumes. Right basilar patchy opacity. No pleural effusion or pneumothorax. Similar enlarged cardiomediastinal silhouette. Median sternotomy wires are nondisplaced. IMPRESSION: 1. Right basilar patchy opacity, likely atelectasis. 2. Similar cardiomegaly. Electronically Signed   By: Limin  Xu M.D.   On: 01/05/2024 15:18      Results LABS Creatinine: elevated (03/14/2024) Phosphorus: significantly elevated (03/14/2024) IgA: markedly elevated (01/2024)  PATHOLOGY Liver biopsy: fibrosis, F3 (06/18/2023)     Assessment & Plan Intertrigo A yeast infection in a skin fold, exacerbated by blood thinners, is managed effectively with nystatin  powder and gauze. Additional measures to keep the area dry, including the use of absorbent, breathable fabric, are discussed. Advise the use of absorbent, breathable fabric in the skin fold to maintain dryness and continue the use of nystatin  powder. 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 Elevated creatinine levels and recent vomiting episodes are noted. Coordination with nephrologist Dr. Lydia Sams is ongoing, with discussions about potential kidney transplant and the use of Tarpeyo for IgA nephropathy. Request renal biopsy results from Dr. Yvonnie Heritage and Dr. Basilio Both notes. Discuss IgA nephropathy and treatment options, including the potential use of Tarpeyo  and starting Mounjaro , with Dr. Lydia Sams. PUD (peptic ulcer disease) Cyclical vomiting of unknown cause so we have been keeping  on proton pump inhibitor (PPI) stomach acid reducer until that calms down despite kidney disease risk. He sometimes gets some dyspepsis still even with it. Cyclical vomiting syndrome not associated with migraine Recent vomiting episodes are linked to dietary intake, specifically cucumbers, and possibly related to kidney function. Symptoms have decreased with dietary adjustments and changes in medication timing. Blood pressure management during dialysis has been adjusted to prevent post-dialysis hypotension. Adjust diet to avoid known triggers and ensure blood pressure medication is taken after dialysis sessions. Liver fibrosis Liver fibrosis is noted as F3. Resuming Mounjaro  for treatment is discussed, pending nephrologist approval due to potential kidney risks. Mounjaro  could aid in weight loss, potentially reducing the need for bariatric surgery. Coordinate with the nephrologist regarding resuming Mounjaro . Recurrent sinusitis Chronic sinusitis with recent episodes of sinus drainage and minor bleeding is exacerbated by pollen exposure. Current management includes antibiotics and sinus rinses. Provide a sinus care handout and advise the use of sinus rinses and nasal steroid spray (Flonase ) to manage symptoms.  Currently pollen quite bad likely flaring.        Orders Placed During this Encounter:   Meds ordered this encounter  Medications   nystatin  (MYCOSTATIN /NYSTOP ) powder    Sig: Apply 1 Application topically 3 (three) times daily.    Dispense:  60 g    Refill:  4              Additional Info: This encounter employed state-of-the-art, real-time, collaborative documentation. The patient actively reviewed and assisted in updating their electronic medical record on a shared screen, ensuring transparency and facilitating joint problem-solving for the problem list,  overview, and plan. This approach promotes accurate, informed care. The treatment plan was discussed and reviewed in detail, including medication safety, potential side effects, and all patient questions. We confirmed understanding and comfort with the plan. Follow-up instructions were established, including contacting the office for any concerns, returning if symptoms worsen, persist, or new symptoms develop, and precautions for potential emergency department visits.   This document was synthesized by artificial intelligence ([employer]bridge) using HIPAA-compliant recording of the clinical interaction;   We discussed the use of AI scribe software for clinical note transcription with the patient, who gave verbal consent to proceed.

## 2024-03-24 DIAGNOSIS — N186 End stage renal disease: Secondary | ICD-10-CM | POA: Diagnosis not present

## 2024-03-24 DIAGNOSIS — N2581 Secondary hyperparathyroidism of renal origin: Secondary | ICD-10-CM | POA: Diagnosis not present

## 2024-03-24 DIAGNOSIS — L299 Pruritus, unspecified: Secondary | ICD-10-CM | POA: Diagnosis not present

## 2024-03-24 DIAGNOSIS — Z992 Dependence on renal dialysis: Secondary | ICD-10-CM | POA: Diagnosis not present

## 2024-03-24 DIAGNOSIS — R52 Pain, unspecified: Secondary | ICD-10-CM | POA: Diagnosis not present

## 2024-03-26 DIAGNOSIS — N2581 Secondary hyperparathyroidism of renal origin: Secondary | ICD-10-CM | POA: Diagnosis not present

## 2024-03-26 DIAGNOSIS — R52 Pain, unspecified: Secondary | ICD-10-CM | POA: Diagnosis not present

## 2024-03-26 DIAGNOSIS — N186 End stage renal disease: Secondary | ICD-10-CM | POA: Diagnosis not present

## 2024-03-26 DIAGNOSIS — Z992 Dependence on renal dialysis: Secondary | ICD-10-CM | POA: Diagnosis not present

## 2024-03-26 DIAGNOSIS — L299 Pruritus, unspecified: Secondary | ICD-10-CM | POA: Diagnosis not present

## 2024-03-28 ENCOUNTER — Inpatient Hospital Stay: Admission: RE | Admit: 2024-03-28 | Source: Ambulatory Visit

## 2024-03-29 DIAGNOSIS — N2581 Secondary hyperparathyroidism of renal origin: Secondary | ICD-10-CM | POA: Diagnosis not present

## 2024-03-29 DIAGNOSIS — R52 Pain, unspecified: Secondary | ICD-10-CM | POA: Diagnosis not present

## 2024-03-29 DIAGNOSIS — Z992 Dependence on renal dialysis: Secondary | ICD-10-CM | POA: Diagnosis not present

## 2024-03-29 DIAGNOSIS — L299 Pruritus, unspecified: Secondary | ICD-10-CM | POA: Diagnosis not present

## 2024-03-29 DIAGNOSIS — N186 End stage renal disease: Secondary | ICD-10-CM | POA: Diagnosis not present

## 2024-03-30 DIAGNOSIS — N186 End stage renal disease: Secondary | ICD-10-CM | POA: Diagnosis not present

## 2024-03-30 DIAGNOSIS — Z992 Dependence on renal dialysis: Secondary | ICD-10-CM | POA: Diagnosis not present

## 2024-03-30 DIAGNOSIS — I509 Heart failure, unspecified: Secondary | ICD-10-CM | POA: Diagnosis not present

## 2024-03-31 DIAGNOSIS — L299 Pruritus, unspecified: Secondary | ICD-10-CM | POA: Diagnosis not present

## 2024-03-31 DIAGNOSIS — N2581 Secondary hyperparathyroidism of renal origin: Secondary | ICD-10-CM | POA: Diagnosis not present

## 2024-03-31 DIAGNOSIS — Z992 Dependence on renal dialysis: Secondary | ICD-10-CM | POA: Diagnosis not present

## 2024-03-31 DIAGNOSIS — N186 End stage renal disease: Secondary | ICD-10-CM | POA: Diagnosis not present

## 2024-03-31 DIAGNOSIS — R52 Pain, unspecified: Secondary | ICD-10-CM | POA: Diagnosis not present

## 2024-04-02 DIAGNOSIS — N186 End stage renal disease: Secondary | ICD-10-CM | POA: Diagnosis not present

## 2024-04-02 DIAGNOSIS — Z992 Dependence on renal dialysis: Secondary | ICD-10-CM | POA: Diagnosis not present

## 2024-04-02 DIAGNOSIS — L299 Pruritus, unspecified: Secondary | ICD-10-CM | POA: Diagnosis not present

## 2024-04-02 DIAGNOSIS — N2581 Secondary hyperparathyroidism of renal origin: Secondary | ICD-10-CM | POA: Diagnosis not present

## 2024-04-02 DIAGNOSIS — R52 Pain, unspecified: Secondary | ICD-10-CM | POA: Diagnosis not present

## 2024-04-04 ENCOUNTER — Other Ambulatory Visit: Payer: Self-pay | Admitting: Internal Medicine

## 2024-04-04 DIAGNOSIS — R1013 Epigastric pain: Secondary | ICD-10-CM

## 2024-04-04 NOTE — Telephone Encounter (Signed)
 Copied from CRM [id number]. Topic: Clinical - Medication Refill >> Apr 04, 2024 10:17 AM Marlan Silva wrote: Most Recent Primary Care Visit:  Provider: MORRISON, RYAN G  Department: [facility]  Visit Type: OFFICE VISIT  Date: 03/23/2024  Medication: pantoprazole  (PROTONIX ) 40 MG tablet  Has the patient contacted their pharmacy? Yes (Agent: If no, request that the patient contact the pharmacy for the refill. If patient does not wish to contact the pharmacy document the reason why and proceed with request.) (Agent: If yes, when and what did the pharmacy advise?)  Is this the correct pharmacy for this prescription? Yes If no, delete pharmacy and type the correct one.  This is the patient's preferred pharmacy:  [id number] [place] - [address] [place] [address] Phone: [phone] Fax: [phone]   Has the prescription been filled recently? Yes  Is the patient out of the medication? Yes  Has the patient been seen for an appointment in the last year OR does the patient have an upcoming appointment? Yes  Can we respond through MyChart? Yes  Agent: Please be advised that Rx refills may take up to 3 business days. We ask that you follow-up with your pharmacy.

## 2024-04-05 DIAGNOSIS — L299 Pruritus, unspecified: Secondary | ICD-10-CM | POA: Diagnosis not present

## 2024-04-05 DIAGNOSIS — N186 End stage renal disease: Secondary | ICD-10-CM | POA: Diagnosis not present

## 2024-04-05 DIAGNOSIS — Z992 Dependence on renal dialysis: Secondary | ICD-10-CM | POA: Diagnosis not present

## 2024-04-05 DIAGNOSIS — R52 Pain, unspecified: Secondary | ICD-10-CM | POA: Diagnosis not present

## 2024-04-05 DIAGNOSIS — N2581 Secondary hyperparathyroidism of renal origin: Secondary | ICD-10-CM | POA: Diagnosis not present

## 2024-04-07 DIAGNOSIS — N186 End stage renal disease: Secondary | ICD-10-CM | POA: Diagnosis not present

## 2024-04-07 DIAGNOSIS — L299 Pruritus, unspecified: Secondary | ICD-10-CM | POA: Diagnosis not present

## 2024-04-07 DIAGNOSIS — R52 Pain, unspecified: Secondary | ICD-10-CM | POA: Diagnosis not present

## 2024-04-07 DIAGNOSIS — N2581 Secondary hyperparathyroidism of renal origin: Secondary | ICD-10-CM | POA: Diagnosis not present

## 2024-04-07 DIAGNOSIS — Z992 Dependence on renal dialysis: Secondary | ICD-10-CM | POA: Diagnosis not present

## 2024-04-09 DIAGNOSIS — N2581 Secondary hyperparathyroidism of renal origin: Secondary | ICD-10-CM | POA: Diagnosis not present

## 2024-04-09 DIAGNOSIS — N186 End stage renal disease: Secondary | ICD-10-CM | POA: Diagnosis not present

## 2024-04-09 DIAGNOSIS — R52 Pain, unspecified: Secondary | ICD-10-CM | POA: Diagnosis not present

## 2024-04-09 DIAGNOSIS — L299 Pruritus, unspecified: Secondary | ICD-10-CM | POA: Diagnosis not present

## 2024-04-09 DIAGNOSIS — Z992 Dependence on renal dialysis: Secondary | ICD-10-CM | POA: Diagnosis not present

## 2024-04-11 DIAGNOSIS — N19 Unspecified kidney failure: Secondary | ICD-10-CM | POA: Diagnosis not present

## 2024-04-11 DIAGNOSIS — J4520 Mild intermittent asthma, uncomplicated: Secondary | ICD-10-CM | POA: Diagnosis not present

## 2024-04-11 DIAGNOSIS — G4733 Obstructive sleep apnea (adult) (pediatric): Secondary | ICD-10-CM | POA: Diagnosis not present

## 2024-04-11 DIAGNOSIS — J301 Allergic rhinitis due to pollen: Secondary | ICD-10-CM | POA: Diagnosis not present

## 2024-04-12 DIAGNOSIS — N186 End stage renal disease: Secondary | ICD-10-CM | POA: Diagnosis not present

## 2024-04-12 DIAGNOSIS — Z992 Dependence on renal dialysis: Secondary | ICD-10-CM | POA: Diagnosis not present

## 2024-04-12 DIAGNOSIS — N2581 Secondary hyperparathyroidism of renal origin: Secondary | ICD-10-CM | POA: Diagnosis not present

## 2024-04-13 ENCOUNTER — Other Ambulatory Visit: Payer: Self-pay | Admitting: Nurse Practitioner

## 2024-04-14 DIAGNOSIS — N186 End stage renal disease: Secondary | ICD-10-CM | POA: Diagnosis not present

## 2024-04-14 DIAGNOSIS — Z992 Dependence on renal dialysis: Secondary | ICD-10-CM | POA: Diagnosis not present

## 2024-04-14 DIAGNOSIS — N2581 Secondary hyperparathyroidism of renal origin: Secondary | ICD-10-CM | POA: Diagnosis not present

## 2024-04-16 DIAGNOSIS — Z992 Dependence on renal dialysis: Secondary | ICD-10-CM | POA: Diagnosis not present

## 2024-04-16 DIAGNOSIS — N186 End stage renal disease: Secondary | ICD-10-CM | POA: Diagnosis not present

## 2024-04-16 DIAGNOSIS — N2581 Secondary hyperparathyroidism of renal origin: Secondary | ICD-10-CM | POA: Diagnosis not present

## 2024-04-18 ENCOUNTER — Ambulatory Visit (HOSPITAL_COMMUNITY)
Admission: RE | Admit: 2024-04-18 | Discharge: 2024-04-18 | Disposition: A | Discharge status: Home / Self Care | Source: Ambulatory Visit | Attending: Vascular Surgery | Admitting: Vascular Surgery

## 2024-04-18 ENCOUNTER — Ambulatory Visit: Attending: Vascular Surgery | Admitting: Physician Assistant

## 2024-04-18 ENCOUNTER — Encounter: Payer: Self-pay | Admitting: Physician Assistant

## 2024-04-18 VITALS — BP 100/51 | HR 59 | Temp 98.3°F | Wt 376.7 lb

## 2024-04-18 DIAGNOSIS — N186 End stage renal disease: Secondary | ICD-10-CM | POA: Diagnosis not present

## 2024-04-18 NOTE — H&P (View-Only) (Signed)
 Office Note     CC:  follow up Requesting Provider:  Anthon Kins, MD  HPI: Sean [name] is a 53 y.o. (July 27, 1971) male who presents status post left radiocephalic fistula creation by Dr. Rosalva Comber on 01/11/2024.  He is currently dialyzing via right IJ [facility] on a Tuesday Thursday Saturday schedule at the [place] location.  He denies steal symptoms and has left hand.  His incision is well-healed.  ESRD is managed by Dr. Lydia Sams.   Past Medical History:  Diagnosis Date   Abnormal liver enzymes    a. Sees a doctor in [place].   Acute renal failure superimposed on stage 5 chronic kidney disease, not on chronic dialysis (HCC) 01/08/2024   AKI (acute kidney injury) (HCC) 06/17/2023   Cardiac cirrhosis    a. possible elevated LFTs/low platelets felt due to cardiac cirrhosis per 2017 admission.   Chronic combined systolic and diastolic CHF (congestive heart failure) (HCC)    Chronic kidney disease, stage 4 (severe) (HCC) 01/04/2016   Lab Results      Component    Value    Date/Time           GFR    29.24 (L)    09/30/2022 02:51 PM           GFR    27.27 (L)    09/24/2022 03:40 PM           GFR    24.10 (L)    09/03/2022 03:45 PM           GFR    18.98 (L)    08/25/2022 12:21 PM           GFR    38.98 (L)    04/24/2022 02:39 PM   Seeing [facility]: Nan Aver, MD  Lastt plan 04/2023: # CKD stage IV    CKD (chronic kidney disease) stage 3, GFR 30-59 ml/min (HCC) 01/04/2016   Lab Results  Component  Value  Date/Time     GFR  29.24 (L)  09/30/2022 02:51 PM     GFR  27.27 (L)  09/24/2022 03:40 PM     GFR  24.10 (L)  09/03/2022 03:45 PM     GFR  18.98 (L)  08/25/2022 12:21 PM     GFR  38.98 (L)  04/24/2022 02:39 PM         CKD (chronic kidney disease), stage II    Stage 4   Congenital heart defect    a. rightward rotation of heart, almost dextrocardia   Coronary artery disease    a. s/p CABGx1 in 12/2015.   Gastric polyps 11/25/2022   Gastritis and  gastroduodenitis 11/25/2022          Gastroesophageal reflux disease with esophagitis and hemorrhage 04/04/2021   Hematuria    a. Chronic hx of this, no prior etiology determined through workup per patient.   History of umbilical hernia repair 07/10/2011   History of hernia surgery twice prior   Hypercholesteremia    a. Prev taken off statin due to abnormal liver function.   Hypertension    Incisional hernia 07/10/2011   History of hernia surgery twice prior   Morbid obesity (HCC)    Morbid obesity with BMI of 50.0-59.9, adult (HCC) 01/04/2016   After [facility] doctor is setting him up with a wellness clinic to help with weight losS  He reports not having tried anything for weight loss either diet or or medicine or surgery  in the past  We failed to get Wegovy  10/2022      Wt Readings from Last 10 Encounters:  11/13/22  (!) 384 lb 12.8 oz (174.5 kg)  10/13/22  (!) 385 lb (174.6 kg)  09/30/22  (!) 384 lb (174.2 kg)  09/24/22  (!) 382 lb (1   Nausea and vomiting 11/25/2022   OSA on CPAP    Paroxysmal atrial flutter (HCC) 02/12/2016   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   LIMA to LAD   Sinus bradycardia    Thrombocytopenia (HCC)     Past Surgical History:  Procedure Laterality Date   APPENDECTOMY     AV FISTULA PLACEMENT Left 01/11/2024   Procedure: LEFT RADIOCEPHALIC ARTERIOVENOUS (AV) FISTULA CREATION;  Surgeon: Kayla Part, MD;  Location: [facility] OR;  Service: Vascular;  Laterality: Left;   BIOPSY  02/19/2021   Procedure: BIOPSY;  Surgeon: Lindle Rhea, MD;  Location: [facility] ENDOSCOPY;  Service: Gastroenterology;;   BIOPSY  11/25/2022   Procedure: BIOPSY;  Surgeon: Lindle Rhea, MD;  Location: [facility];  Service: Gastroenterology;;   CARDIAC CATHETERIZATION N/A 12/24/2015   Procedure: Right/Left Heart Cath and Coronary Angiography;  Surgeon: Mardell Shade, MD;  Location: [facility] INVASIVE CV LAB;  Service: Cardiovascular;  Laterality: N/A;   CARDIOVERSION N/A 02/14/2016    Procedure: CARDIOVERSION;  Surgeon: Hazle Lites, MD;  Location: [facility] ENDOSCOPY;  Service: Cardiovascular;  Laterality: N/A;   COLONOSCOPY WITH PROPOFOL  N/A 02/19/2021   Procedure: COLONOSCOPY WITH PROPOFOL ;  Surgeon: Lindle Rhea, MD;  Location: [facility]OS[facility];  Service: Gastroenterology;  Laterality: N/A;   CORONARY ARTERY BYPASS GRAFT N/A 12/26/2015   Procedure: Off Pump Coronary Artery Bypass Grafting times one using left internal mammary artery;  Surgeon: Gardenia Jump, MD;  Location: [facility] OR;  Service: Open Heart Surgery;  Laterality: N/A;   ESOPHAGOGASTRODUODENOSCOPY (EGD) WITH PROPOFOL  N/A 02/19/2021   Procedure: ESOPHAGOGASTRODUODENOSCOPY (EGD) WITH PROPOFOL ;  Surgeon: Lindle Rhea, MD;  Location: [facility];  Service: Gastroenterology;  Laterality: N/A;   ESOPHAGOGASTRODUODENOSCOPY (EGD) WITH PROPOFOL  N/A 11/25/2022   Procedure: ESOPHAGOGASTRODUODENOSCOPY (EGD) WITH PROPOFOL ;  Surgeon: Lindle Rhea, MD;  Location: [facility];  Service: Gastroenterology;  Laterality: N/A;   GALLBLADDER SURGERY     HERNIA REPAIR     INSERTION OF DIALYSIS CATHETER Right 01/11/2024   Procedure: INSERTION OF TUNNELED  DIALYSIS CATHETER RIGHT INTERNAL JUGULAR;  Surgeon: Kayla Part, MD;  Location: [facility] OR;  Service: Vascular;  Laterality: Right;   IR TRANSCATHETER BX  06/26/2023   IR US  GUIDE VASC ACCESS RIGHT  06/26/2023   IR VENOGRAM HEPATIC W HEMODYNAMIC EVALUATION  06/26/2023   LEFT HEART CATH AND CORS/GRAFTS ANGIOGRAPHY N/A 04/15/2017   Procedure: Left Heart Cath and Cors/Grafts Angiography;  Surgeon: Millicent Ally, MD;  Location: [facility] INVASIVE CV LAB;  Service: Cardiovascular;  Laterality: N/A;   LEFT HEART CATH AND CORS/GRAFTS ANGIOGRAPHY N/A 11/02/2020   Procedure: LEFT HEART CATH AND CORS/GRAFTS ANGIOGRAPHY;  Surgeon: Arnoldo Lapping, MD;  Location: [facility] INVASIVE CV LAB;  Service: Cardiovascular;  Laterality: N/A;   POLYPECTOMY  02/19/2021   Procedure: POLYPECTOMY;  Surgeon: Lindle Rhea, MD;  Location: [facility]OSCOPY;  Service: Gastroenterology;;   TEE WITHOUT CARDIOVERSION N/A 12/26/2015   Procedure: TRANSESOPHAGEAL ECHOCARDIOGRAM (TEE);  Surgeon: Gardenia Jump, MD;  Location: [facility] OR;  Service: Open Heart Surgery;  Laterality: N/A;   TEE WITHOUT CARDIOVERSION N/A 02/14/2016   Procedure: TRANSESOPHAGEAL ECHOCARDIOGRAM (TEE);  Surgeon: Hazle Lites, MD;  Location: [facility] ENDOSCOPY;  Service:  Cardiovascular;  Laterality: N/A;    Social History   Socioeconomic History   Marital status: Divorced    Spouse name: Not on file   Number of children: Not on file   Years of education: Not on file   Highest education level: Not on file  Occupational History   Occupation: [employer] doctor  Tobacco Use   Smoking status: Never   Smokeless tobacco: Never  Vaping Use   Vaping status: Never Used  Substance and Sexual Activity   Alcohol use: Yes    Comment: 6 beers per month   Drug use: No   Sexual activity: Not Currently  Other Topics Concern   Not on file  Social History Narrative   Truck driver -- [employer]   Lives with mom and dad   Social Drivers of Health   Financial Resource Strain: Not on file  Food Insecurity: No Food Insecurity (02/22/2024)   Hunger Vital Sign    Worried About Running Out of Food in the Last Year: Never true    Ran Out of Food in the Last Year: Never true  Transportation Needs: No Transportation Needs (02/22/2024)   PRAPARE - Administrator, Civil Service (Medical): No    Lack of Transportation (Non-Medical): No  Physical Activity: Not on file  Stress: Not on file  Social Connections: Unknown (08/26/2022)   Received from [facility], [facility]   Social Network    Social Network: Not on file  Intimate Partner Violence: Not At Risk (02/22/2024)   Humiliation, Afraid, Rape, and Kick questionnaire    Fear of Current or Ex-Partner: No    Emotionally Abused: No    Physically Abused: No    Sexually Abused: No    Family History   Problem Relation Age of Onset   CAD Father        4 stents ~ 54   Diabetes Father    Diabetes Maternal Grandfather    Diabetes Maternal Uncle    Heart attack Other    Hyperlipidemia Other     Current Outpatient Medications  Medication Sig Dispense Refill   acetaminophen  (TYLENOL ) 500 MG tablet Take 1,500 mg by mouth once as needed for moderate pain (pain score 4-6), fever, headache or mild pain (pain score 1-3).     Acetaminophen  500 MG PACK Take by mouth.     allopurinol  (ZYLOPRIM ) 100 MG tablet Take 1 tablet (100 mg total) by mouth daily.     amitriptyline  (ELAVIL ) 25 MG tablet TAKE 1 TABLET BY MOUTH EVERYDAY AT BEDTIME 30 tablet 0   azelastine  (ASTELIN ) 0.1 % nasal spray Place 2 sprays into both nostrils 2 (two) times daily. Use in each nostril as directed (Patient taking differently: Place 2 sprays into both nostrils as needed for allergies. Use in each nostril as directed) 30 mL 12   B Complex-C-Folic Acid  (DIALYVITE TABLET) TABS Take by mouth.     calcium  acetate (PHOSLO ) 667 MG capsule Take 2 capsules (1,334 mg total) by mouth 3 (three) times daily with meals. 180 capsule 0   Calcium  Carb-Cholecalciferol 600-25 MG-MCG CAPS Take by mouth.     Cholecalciferol (VITAMIN D3) 125 MCG (5000 UT) CAPS Take 1 capsule (5,000 Units total) by mouth daily at 12 noon. 90 capsule 1   Cyanocobalamin  (VITAMIN B-12 PO) Place 1 tablet under the tongue daily.     diclofenac  Sodium (VOLTAREN ) 1 % GEL Apply 2 g topically 4 (four) times daily for 7 days, THEN 2 g 4 (four)  times daily as needed. 150 g 0   diltiazem  (CARDIZEM  SR) 120 MG 12 hr capsule TAKE 1 CAPSULE(120 MG) BY MOUTH TWICE DAILY 180 capsule 1   ELIQUIS  5 MG TABS tablet TAKE 1 TABLET(5 MG) BY MOUTH TWICE DAILY 180 tablet 1   Evolocumab  (REPATHA  SURECLICK) 140 MG/ML SOAJ INJECT THE CONTENTS OF 1 SYRINGE UNDER THE SKIN EVERY 14 DAYS 6 mL 3   famotidine -calcium  carbonate-magnesium  hydroxide (PEPCID  COMPLETE) 10-800-165 MG chewable tablet Chew 1  tablet by mouth 2 (two) times daily as needed. 100 tablet 11   fluticasone  (FLONASE ) 50 MCG/ACT nasal spray Place 2 sprays into both nostrils daily. 15.8 mL 6   iron sucrose in sodium chloride  0.9 % 100 mL Iron Sucrose (Venofer)     ketoconazole  (NIZORAL ) 2 % cream Apply 1 Application topically 2 (two) times daily. 60 g 0   metoprolol  tartrate (LOPRESSOR ) 25 MG tablet Take 1 tablet (25 mg total) by mouth 2 (two) times daily. 180 tablet 3   montelukast  (SINGULAIR ) 10 MG tablet TAKE 1 TABLET(10 MG) BY MOUTH DAILY (Patient taking differently: Take 10 mg by mouth daily.) 90 tablet 1   nitroGLYCERIN  (NITROSTAT ) 0.4 MG SL tablet Place 1 tablet (0.4 mg total) under the tongue every 5 (five) minutes as needed for chest pain. 25 tablet 6   nystatin  (MYCOSTATIN /NYSTOP ) powder Apply 1 Application topically 3 (three) times daily. 60 g 4   ondansetron  (ZOFRAN -ODT) 4 MG disintegrating tablet Take 1 tablet (4 mg total) by mouth every 8 (eight) hours as needed for nausea or vomiting. 30 tablet 2   pantoprazole  (PROTONIX ) 40 MG tablet TAKE 1 TABLET(40 MG) BY MOUTH TWICE DAILY 180 tablet 0   polyethylene glycol (MIRALAX  / GLYCOLAX ) 17 g packet Mix 17 grams (1 packet) in 8 ounces of fluid and take by mouth daily. (Patient taking differently: Take 17 g by mouth daily as needed for moderate constipation.) 14 each 0   sucralfate  (CARAFATE ) 1 g tablet Take 1 tablet (1 g total) by mouth at bedtime. 30 tablet 1   tirzepatide  (MOUNJARO ) 2.5 MG/0.5ML Pen Inject 2.5 mg into the skin once a week. 2 mL 3   triamcinolone  (NASACORT ) 55 MCG/ACT AERO nasal inhaler Place 1 spray into the nose daily. Start with 1 spray each side twice a day for 3 days, then reduce to daily. 1 each 2   No current facility-administered medications for this visit.   Facility-Administered Medications Ordered in Other Visits  Medication Dose Route Frequency Provider Last Rate Last Admin   technetium tetrofosmin  (TC-MYOVIEW ) injection 29.8 millicurie   29.8 millicurie Intravenous Once PRN Hilty, Aviva Lemmings, MD        Allergies  Allergen Reactions   Glucophage  [Metformin ] Other (See Comments)    Pt told by his nephrologist not to take this medication due to his kidney function.   Vibra -Tab [Doxycycline ] Shortness Of Breath   Chlorhexidine  Itching   Chlorhexidine  Gluconate Itching and Other (See Comments)    burning   Augmentin [Amoxicillin-Pot Clavulanate] Itching   Imdur  [Isosorbide  Dinitrate] Diarrhea   Tape Rash   Valacyclovir  Itching     REVIEW OF SYSTEMS:   [X]  denotes positive finding, [ ]  denotes negative finding Cardiac  Comments:  Chest pain or chest pressure:    Shortness of breath upon exertion:    Short of breath when lying flat:    Irregular heart rhythm:        Vascular    Pain in calf, thigh, or hip brought on by  ambulation:    Pain in feet at night that wakes you up from your sleep:     Blood clot in your veins:    Leg swelling:         Pulmonary    Oxygen at home:    Productive cough:     Wheezing:         Neurologic    Sudden weakness in arms or legs:     Sudden numbness in arms or legs:     Sudden onset of difficulty speaking or slurred speech:    Temporary loss of vision in one eye:     Problems with dizziness:         Gastrointestinal    Blood in stool:     Vomited blood:         Genitourinary    Burning when urinating:     Blood in urine:        Psychiatric    Major depression:         Hematologic    Bleeding problems:    Problems with blood clotting too easily:        Skin    Rashes or ulcers:        Constitutional    Fever or chills:      PHYSICAL EXAMINATION:  Vitals:   04/18/24 1333  BP: (!) 100/51  Pulse: (!) 59  Temp: 98.3 F (36.8 C)  TempSrc: Temporal  SpO2: 97%  Weight: (!) 376 lb 11.2 oz (170.9 kg)    General:  WDWN in NAD; vital signs documented above Gait: Not observed HENT: WNL, normocephalic Pulmonary: normal non-labored breathing Cardiac: regular  HR Abdomen: soft, NT, no masses Skin: without rashes Vascular Exam/Pulses: Palpable left radial pulse; easily palpable thrill from the anastomosis to the mid forearm; fistula runs deeper from the mid forearm to the [facility] fossa Extremities: without ischemic changes, without Gangrene , without cellulitis; without open wounds;  Musculoskeletal: no muscle wasting or atrophy  Neurologic: A&O X 3 Psychiatric:  The pt has Normal affect.   Non-Invasive Vascular Imaging:   Patent left radiocephalic fistula with diameter greater than 6 mm throughout; depth around 5 to 6 mm in the forearm with multiple sidebranches    ASSESSMENT/PLAN:: 53 y.o. male status post left radiocephalic fistula creation 3 months ago  Patent radiocephalic fistula in the left arm without signs or symptoms of steal syndrome in the left hand.  Easily palpable thrill at the anastomosis and to the mid forearm however fistula becomes more difficult to palpate as you move towards the [facility] fossa.  Duplex demonstrates the depth of the fistula to be around 5 to 6 mm with numerous sidebranches.  Given the depth and sidebranches he was offered 2 options.  Option 1 would be to proceed with sidebranch ligation and superficialization to hopefully improve longevity of radiocephalic fistula.  Option 2 would be to allow the dialysis center to begin cannulating.  If cannulation proves to be difficult, we can then proceed with the above-mentioned surgery.  He would like to let Dr. Lydia Sams, his nephrologist, evaluate his fistula tomorrow before making his decision.  If he decides to proceed with left arm AV fistula superficialization and sidebranch ligation he will notify our office and will be scheduled over the phone.  He does not need to return to office.     Cordie Deters, PA-C [facility] [phone]  Clinic MD:   Charlotte Cookey

## 2024-04-18 NOTE — Progress Notes (Signed)
 Office Note     CC:  follow up Requesting Provider:  Anthon Kins, MD  HPI: Sean [name] is a 53 y.o. (July 27, 1971) male who presents status post left radiocephalic fistula creation by Dr. Rosalva Comber on 01/11/2024.  He is currently dialyzing via right IJ [facility] on a Tuesday Thursday Saturday schedule at the [place] location.  He denies steal symptoms and has left hand.  His incision is well-healed.  ESRD is managed by Dr. Lydia Sams.   Past Medical History:  Diagnosis Date   Abnormal liver enzymes    a. Sees a doctor in [place].   Acute renal failure superimposed on stage 5 chronic kidney disease, not on chronic dialysis (HCC) 01/08/2024   AKI (acute kidney injury) (HCC) 06/17/2023   Cardiac cirrhosis    a. possible elevated LFTs/low platelets felt due to cardiac cirrhosis per 2017 admission.   Chronic combined systolic and diastolic CHF (congestive heart failure) (HCC)    Chronic kidney disease, stage 4 (severe) (HCC) 01/04/2016   Lab Results      Component    Value    Date/Time           GFR    29.24 (L)    09/30/2022 02:51 PM           GFR    27.27 (L)    09/24/2022 03:40 PM           GFR    24.10 (L)    09/03/2022 03:45 PM           GFR    18.98 (L)    08/25/2022 12:21 PM           GFR    38.98 (L)    04/24/2022 02:39 PM   Seeing [facility]: Nan Aver, MD  Lastt plan 04/2023: # CKD stage IV    CKD (chronic kidney disease) stage 3, GFR 30-59 ml/min (HCC) 01/04/2016   Lab Results  Component  Value  Date/Time     GFR  29.24 (L)  09/30/2022 02:51 PM     GFR  27.27 (L)  09/24/2022 03:40 PM     GFR  24.10 (L)  09/03/2022 03:45 PM     GFR  18.98 (L)  08/25/2022 12:21 PM     GFR  38.98 (L)  04/24/2022 02:39 PM         CKD (chronic kidney disease), stage II    Stage 4   Congenital heart defect    a. rightward rotation of heart, almost dextrocardia   Coronary artery disease    a. s/p CABGx1 in 12/2015.   Gastric polyps 11/25/2022   Gastritis and  gastroduodenitis 11/25/2022          Gastroesophageal reflux disease with esophagitis and hemorrhage 04/04/2021   Hematuria    a. Chronic hx of this, no prior etiology determined through workup per patient.   History of umbilical hernia repair 07/10/2011   History of hernia surgery twice prior   Hypercholesteremia    a. Prev taken off statin due to abnormal liver function.   Hypertension    Incisional hernia 07/10/2011   History of hernia surgery twice prior   Morbid obesity (HCC)    Morbid obesity with BMI of 50.0-59.9, adult (HCC) 01/04/2016   After [facility] doctor is setting him up with a wellness clinic to help with weight losS  He reports not having tried anything for weight loss either diet or or medicine or surgery  in the past  We failed to get Wegovy  10/2022      Wt Readings from Last 10 Encounters:  11/13/22  (!) 384 lb 12.8 oz (174.5 kg)  10/13/22  (!) 385 lb (174.6 kg)  09/30/22  (!) 384 lb (174.2 kg)  09/24/22  (!) 382 lb (1   Nausea and vomiting 11/25/2022   OSA on CPAP    Paroxysmal atrial flutter (HCC) 02/12/2016   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   LIMA to LAD   Sinus bradycardia    Thrombocytopenia (HCC)     Past Surgical History:  Procedure Laterality Date   APPENDECTOMY     AV FISTULA PLACEMENT Left 01/11/2024   Procedure: LEFT RADIOCEPHALIC ARTERIOVENOUS (AV) FISTULA CREATION;  Surgeon: Kayla Part, MD;  Location: [facility] OR;  Service: Vascular;  Laterality: Left;   BIOPSY  02/19/2021   Procedure: BIOPSY;  Surgeon: Lindle Rhea, MD;  Location: [facility] ENDOSCOPY;  Service: Gastroenterology;;   BIOPSY  11/25/2022   Procedure: BIOPSY;  Surgeon: Lindle Rhea, MD;  Location: [facility];  Service: Gastroenterology;;   CARDIAC CATHETERIZATION N/A 12/24/2015   Procedure: Right/Left Heart Cath and Coronary Angiography;  Surgeon: Mardell Shade, MD;  Location: [facility] INVASIVE CV LAB;  Service: Cardiovascular;  Laterality: N/A;   CARDIOVERSION N/A 02/14/2016    Procedure: CARDIOVERSION;  Surgeon: Hazle Lites, MD;  Location: [facility] ENDOSCOPY;  Service: Cardiovascular;  Laterality: N/A;   COLONOSCOPY WITH PROPOFOL  N/A 02/19/2021   Procedure: COLONOSCOPY WITH PROPOFOL ;  Surgeon: Lindle Rhea, MD;  Location: [facility]OSCOPY;  Service: Gastroenterology;  Laterality: N/A;   CORONARY ARTERY BYPASS GRAFT N/A 12/26/2015   Procedure: Off Pump Coronary Artery Bypass Grafting times one using left internal mammary artery;  Surgeon: Gardenia Jump, MD;  Location: [facility] OR;  Service: Open Heart Surgery;  Laterality: N/A;   ESOPHAGOGASTRODUODENOSCOPY (EGD) WITH PROPOFOL  N/A 02/19/2021   Procedure: ESOPHAGOGASTRODUODENOSCOPY (EGD) WITH PROPOFOL ;  Surgeon: Lindle Rhea, MD;  Location: [facility];  Service: Gastroenterology;  Laterality: N/A;   ESOPHAGOGASTRODUODENOSCOPY (EGD) WITH PROPOFOL  N/A 11/25/2022   Procedure: ESOPHAGOGASTRODUODENOSCOPY (EGD) WITH PROPOFOL ;  Surgeon: Lindle Rhea, MD;  Location: [facility];  Service: Gastroenterology;  Laterality: N/A;   GALLBLADDER SURGERY     HERNIA REPAIR     INSERTION OF DIALYSIS CATHETER Right 01/11/2024   Procedure: INSERTION OF TUNNELED  DIALYSIS CATHETER RIGHT INTERNAL JUGULAR;  Surgeon: Kayla Part, MD;  Location: [facility] OR;  Service: Vascular;  Laterality: Right;   IR TRANSCATHETER BX  06/26/2023   IR US  GUIDE VASC ACCESS RIGHT  06/26/2023   IR VENOGRAM HEPATIC W HEMODYNAMIC EVALUATION  06/26/2023   LEFT HEART CATH AND CORS/GRAFTS ANGIOGRAPHY N/A 04/15/2017   Procedure: Left Heart Cath and Cors/Grafts Angiography;  Surgeon: Millicent Ally, MD;  Location: [facility] INVASIVE CV LAB;  Service: Cardiovascular;  Laterality: N/A;   LEFT HEART CATH AND CORS/GRAFTS ANGIOGRAPHY N/A 11/02/2020   Procedure: LEFT HEART CATH AND CORS/GRAFTS ANGIOGRAPHY;  Surgeon: Arnoldo Lapping, MD;  Location: [facility] INVASIVE CV LAB;  Service: Cardiovascular;  Laterality: N/A;   POLYPECTOMY  02/19/2021   Procedure: POLYPECTOMY;  Surgeon: Lindle Rhea, MD;  Location: [facility] ENDOSCOPY;  Service: Gastroenterology;;   TEE WITHOUT CARDIOVERSION N/A 12/26/2015   Procedure: TRANSESOPHAGEAL ECHOCARDIOGRAM (TEE);  Surgeon: Gardenia Jump, MD;  Location: [facility] OR;  Service: Open Heart Surgery;  Laterality: N/A;   TEE WITHOUT CARDIOVERSION N/A 02/14/2016   Procedure: TRANSESOPHAGEAL ECHOCARDIOGRAM (TEE);  Surgeon: Hazle Lites, MD;  Location: [facility] ENDOSCOPY;  Service:  Cardiovascular;  Laterality: N/A;    Social History   Socioeconomic History   Marital status: Divorced    Spouse name: Not on file   Number of children: Not on file   Years of education: Not on file   Highest education level: Not on file  Occupational History   Occupation: [employer] doctor  Tobacco Use   Smoking status: Never   Smokeless tobacco: Never  Vaping Use   Vaping status: Never Used  Substance and Sexual Activity   Alcohol use: Yes    Comment: 6 beers per month   Drug use: No   Sexual activity: Not Currently  Other Topics Concern   Not on file  Social History Narrative   Truck driver -- [employer]   Lives with mom and dad   Social Drivers of Health   Financial Resource Strain: Not on file  Food Insecurity: No Food Insecurity (02/22/2024)   Hunger Vital Sign    Worried About Running Out of Food in the Last Year: Never true    Ran Out of Food in the Last Year: Never true  Transportation Needs: No Transportation Needs (02/22/2024)   PRAPARE - Administrator, Civil Service (Medical): No    Lack of Transportation (Non-Medical): No  Physical Activity: Not on file  Stress: Not on file  Social Connections: Unknown (08/26/2022)   Received from [facility], [facility]   Social Network    Social Network: Not on file  Intimate Partner Violence: Not At Risk (02/22/2024)   Humiliation, Afraid, Rape, and Kick questionnaire    Fear of Current or Ex-Partner: No    Emotionally Abused: No    Physically Abused: No    Sexually Abused: No    Family History   Problem Relation Age of Onset   CAD Father        4 stents ~ 54   Diabetes Father    Diabetes Maternal Grandfather    Diabetes Maternal Uncle    Heart attack Other    Hyperlipidemia Other     Current Outpatient Medications  Medication Sig Dispense Refill   acetaminophen  (TYLENOL ) 500 MG tablet Take 1,500 mg by mouth once as needed for moderate pain (pain score 4-6), fever, headache or mild pain (pain score 1-3).     Acetaminophen  500 MG PACK Take by mouth.     allopurinol  (ZYLOPRIM ) 100 MG tablet Take 1 tablet (100 mg total) by mouth daily.     amitriptyline  (ELAVIL ) 25 MG tablet TAKE 1 TABLET BY MOUTH EVERYDAY AT BEDTIME 30 tablet 0   azelastine  (ASTELIN ) 0.1 % nasal spray Place 2 sprays into both nostrils 2 (two) times daily. Use in each nostril as directed (Patient taking differently: Place 2 sprays into both nostrils as needed for allergies. Use in each nostril as directed) 30 mL 12   B Complex-C-Folic Acid  (DIALYVITE TABLET) TABS Take by mouth.     calcium  acetate (PHOSLO ) 667 MG capsule Take 2 capsules (1,334 mg total) by mouth 3 (three) times daily with meals. 180 capsule 0   Calcium  Carb-Cholecalciferol 600-25 MG-MCG CAPS Take by mouth.     Cholecalciferol (VITAMIN D3) 125 MCG (5000 UT) CAPS Take 1 capsule (5,000 Units total) by mouth daily at 12 noon. 90 capsule 1   Cyanocobalamin  (VITAMIN B-12 PO) Place 1 tablet under the tongue daily.     diclofenac  Sodium (VOLTAREN ) 1 % GEL Apply 2 g topically 4 (four) times daily for 7 days, THEN 2 g 4 (four)  times daily as needed. 150 g 0   diltiazem  (CARDIZEM  SR) 120 MG 12 hr capsule TAKE 1 CAPSULE(120 MG) BY MOUTH TWICE DAILY 180 capsule 1   ELIQUIS  5 MG TABS tablet TAKE 1 TABLET(5 MG) BY MOUTH TWICE DAILY 180 tablet 1   Evolocumab  (REPATHA  SURECLICK) 140 MG/ML SOAJ INJECT THE CONTENTS OF 1 SYRINGE UNDER THE SKIN EVERY 14 DAYS 6 mL 3   famotidine -calcium  carbonate-magnesium  hydroxide (PEPCID  COMPLETE) 10-800-165 MG chewable tablet Chew 1  tablet by mouth 2 (two) times daily as needed. 100 tablet 11   fluticasone  (FLONASE ) 50 MCG/ACT nasal spray Place 2 sprays into both nostrils daily. 15.8 mL 6   iron sucrose in sodium chloride  0.9 % 100 mL Iron Sucrose (Venofer)     ketoconazole  (NIZORAL ) 2 % cream Apply 1 Application topically 2 (two) times daily. 60 g 0   metoprolol  tartrate (LOPRESSOR ) 25 MG tablet Take 1 tablet (25 mg total) by mouth 2 (two) times daily. 180 tablet 3   montelukast  (SINGULAIR ) 10 MG tablet TAKE 1 TABLET(10 MG) BY MOUTH DAILY (Patient taking differently: Take 10 mg by mouth daily.) 90 tablet 1   nitroGLYCERIN  (NITROSTAT ) 0.4 MG SL tablet Place 1 tablet (0.4 mg total) under the tongue every 5 (five) minutes as needed for chest pain. 25 tablet 6   nystatin  (MYCOSTATIN /NYSTOP ) powder Apply 1 Application topically 3 (three) times daily. 60 g 4   ondansetron  (ZOFRAN -ODT) 4 MG disintegrating tablet Take 1 tablet (4 mg total) by mouth every 8 (eight) hours as needed for nausea or vomiting. 30 tablet 2   pantoprazole  (PROTONIX ) 40 MG tablet TAKE 1 TABLET(40 MG) BY MOUTH TWICE DAILY 180 tablet 0   polyethylene glycol (MIRALAX  / GLYCOLAX ) 17 g packet Mix 17 grams (1 packet) in 8 ounces of fluid and take by mouth daily. (Patient taking differently: Take 17 g by mouth daily as needed for moderate constipation.) 14 each 0   sucralfate  (CARAFATE ) 1 g tablet Take 1 tablet (1 g total) by mouth at bedtime. 30 tablet 1   tirzepatide  (MOUNJARO ) 2.5 MG/0.5ML Pen Inject 2.5 mg into the skin once a week. 2 mL 3   triamcinolone  (NASACORT ) 55 MCG/ACT AERO nasal inhaler Place 1 spray into the nose daily. Start with 1 spray each side twice a day for 3 days, then reduce to daily. 1 each 2   No current facility-administered medications for this visit.   Facility-Administered Medications Ordered in Other Visits  Medication Dose Route Frequency Provider Last Rate Last Admin   technetium tetrofosmin  (TC-MYOVIEW ) injection 29.8 millicurie   29.8 millicurie Intravenous Once PRN Hilty, Aviva Lemmings, MD        Allergies  Allergen Reactions   Glucophage  [Metformin ] Other (See Comments)    Pt told by his nephrologist not to take this medication due to his kidney function.   Vibra -Tab [Doxycycline ] Shortness Of Breath   Chlorhexidine  Itching   Chlorhexidine  Gluconate Itching and Other (See Comments)    burning   Augmentin [Amoxicillin-Pot Clavulanate] Itching   Imdur  [Isosorbide  Dinitrate] Diarrhea   Tape Rash   Valacyclovir  Itching     REVIEW OF SYSTEMS:   [X]  denotes positive finding, [ ]  denotes negative finding Cardiac  Comments:  Chest pain or chest pressure:    Shortness of breath upon exertion:    Short of breath when lying flat:    Irregular heart rhythm:        Vascular    Pain in calf, thigh, or hip brought on by  ambulation:    Pain in feet at night that wakes you up from your sleep:     Blood clot in your veins:    Leg swelling:         Pulmonary    Oxygen at home:    Productive cough:     Wheezing:         Neurologic    Sudden weakness in arms or legs:     Sudden numbness in arms or legs:     Sudden onset of difficulty speaking or slurred speech:    Temporary loss of vision in one eye:     Problems with dizziness:         Gastrointestinal    Blood in stool:     Vomited blood:         Genitourinary    Burning when urinating:     Blood in urine:        Psychiatric    Major depression:         Hematologic    Bleeding problems:    Problems with blood clotting too easily:        Skin    Rashes or ulcers:        Constitutional    Fever or chills:      PHYSICAL EXAMINATION:  Vitals:   04/18/24 1333  BP: (!) 100/51  Pulse: (!) 59  Temp: 98.3 F (36.8 C)  TempSrc: Temporal  SpO2: 97%  Weight: (!) 376 lb 11.2 oz (170.9 kg)    General:  WDWN in NAD; vital signs documented above Gait: Not observed HENT: WNL, normocephalic Pulmonary: normal non-labored breathing Cardiac: regular  HR Abdomen: soft, NT, no masses Skin: without rashes Vascular Exam/Pulses: Palpable left radial pulse; easily palpable thrill from the anastomosis to the mid forearm; fistula runs deeper from the mid forearm to the [facility] fossa Extremities: without ischemic changes, without Gangrene , without cellulitis; without open wounds;  Musculoskeletal: no muscle wasting or atrophy  Neurologic: A&O X 3 Psychiatric:  The pt has Normal affect.   Non-Invasive Vascular Imaging:   Patent left radiocephalic fistula with diameter greater than 6 mm throughout; depth around 5 to 6 mm in the forearm with multiple sidebranches    ASSESSMENT/PLAN:: 53 y.o. male status post left radiocephalic fistula creation 3 months ago  Patent radiocephalic fistula in the left arm without signs or symptoms of steal syndrome in the left hand.  Easily palpable thrill at the anastomosis and to the mid forearm however fistula becomes more difficult to palpate as you move towards the [facility] fossa.  Duplex demonstrates the depth of the fistula to be around 5 to 6 mm with numerous sidebranches.  Given the depth and sidebranches he was offered 2 options.  Option 1 would be to proceed with sidebranch ligation and superficialization to hopefully improve longevity of radiocephalic fistula.  Option 2 would be to allow the dialysis center to begin cannulating.  If cannulation proves to be difficult, we can then proceed with the above-mentioned surgery.  He would like to let Dr. Lydia Sams, his nephrologist, evaluate his fistula tomorrow before making his decision.  If he decides to proceed with left arm AV fistula superficialization and sidebranch ligation he will notify our office and will be scheduled over the phone.  He does not need to return to office.     Cordie Deters, PA-C [facility] [phone]  Clinic MD:   Charlotte Cookey

## 2024-04-19 DIAGNOSIS — Z992 Dependence on renal dialysis: Secondary | ICD-10-CM | POA: Diagnosis not present

## 2024-04-19 DIAGNOSIS — N186 End stage renal disease: Secondary | ICD-10-CM | POA: Diagnosis not present

## 2024-04-19 DIAGNOSIS — N2581 Secondary hyperparathyroidism of renal origin: Secondary | ICD-10-CM | POA: Diagnosis not present

## 2024-04-21 DIAGNOSIS — N186 End stage renal disease: Secondary | ICD-10-CM | POA: Diagnosis not present

## 2024-04-21 DIAGNOSIS — Z992 Dependence on renal dialysis: Secondary | ICD-10-CM | POA: Diagnosis not present

## 2024-04-21 DIAGNOSIS — N2581 Secondary hyperparathyroidism of renal origin: Secondary | ICD-10-CM | POA: Diagnosis not present

## 2024-04-22 ENCOUNTER — Ambulatory Visit: Admitting: Gastroenterology

## 2024-04-22 ENCOUNTER — Ambulatory Visit (INDEPENDENT_AMBULATORY_CARE_PROVIDER_SITE_OTHER): Admitting: Internal Medicine

## 2024-04-22 ENCOUNTER — Encounter: Payer: Self-pay | Admitting: Internal Medicine

## 2024-04-22 ENCOUNTER — Telehealth: Payer: Self-pay

## 2024-04-22 ENCOUNTER — Encounter: Payer: Self-pay | Admitting: Gastroenterology

## 2024-04-22 VITALS — BP 122/68 | HR 60 | Ht 71.0 in | Wt 371.0 lb

## 2024-04-22 DIAGNOSIS — K7210 Chronic hepatic failure without coma: Secondary | ICD-10-CM

## 2024-04-22 DIAGNOSIS — I10 Essential (primary) hypertension: Secondary | ICD-10-CM

## 2024-04-22 DIAGNOSIS — D696 Thrombocytopenia, unspecified: Secondary | ICD-10-CM | POA: Diagnosis not present

## 2024-04-22 DIAGNOSIS — R252 Cramp and spasm: Secondary | ICD-10-CM

## 2024-04-22 DIAGNOSIS — E782 Mixed hyperlipidemia: Secondary | ICD-10-CM

## 2024-04-22 DIAGNOSIS — L304 Erythema intertrigo: Secondary | ICD-10-CM

## 2024-04-22 DIAGNOSIS — N186 End stage renal disease: Secondary | ICD-10-CM | POA: Diagnosis not present

## 2024-04-22 DIAGNOSIS — K5904 Chronic idiopathic constipation: Secondary | ICD-10-CM

## 2024-04-22 DIAGNOSIS — K7581 Nonalcoholic steatohepatitis (NASH): Secondary | ICD-10-CM | POA: Diagnosis not present

## 2024-04-22 DIAGNOSIS — I2510 Atherosclerotic heart disease of native coronary artery without angina pectoris: Secondary | ICD-10-CM

## 2024-04-22 DIAGNOSIS — K219 Gastro-esophageal reflux disease without esophagitis: Secondary | ICD-10-CM

## 2024-04-22 DIAGNOSIS — G4733 Obstructive sleep apnea (adult) (pediatric): Secondary | ICD-10-CM

## 2024-04-22 DIAGNOSIS — Z992 Dependence on renal dialysis: Secondary | ICD-10-CM

## 2024-04-22 DIAGNOSIS — R7989 Other specified abnormal findings of blood chemistry: Secondary | ICD-10-CM

## 2024-04-22 DIAGNOSIS — R198 Other specified symptoms and signs involving the digestive system and abdomen: Secondary | ICD-10-CM

## 2024-04-22 DIAGNOSIS — Z8719 Personal history of other diseases of the digestive system: Secondary | ICD-10-CM

## 2024-04-22 DIAGNOSIS — K31A0 Gastric intestinal metaplasia, unspecified: Secondary | ICD-10-CM

## 2024-04-22 MED ORDER — TIRZEPATIDE 2.5 MG/0.5ML ~~LOC~~ SOAJ: 2.5000 mg | SUBCUTANEOUS | 3 refills | Status: DC

## 2024-04-22 NOTE — Progress Notes (Signed)
 Chief Complaint:elevated LFT's Primary GI Doctor:(previously Dr. Savannah Curlin) Dr. Rosaline Coma  HPI:  Patient is a  53  year old male patient with past medical history of CKD stage 5, thrombocytopenia, GERD,morbid obesity, CAD s/p CABG 2017, ischemic cardiomyopathy, PAF on chronic AC,advanced fibrosis likely secondary to [facility] versus congestive hepatopathy. who was referred to me by Anthon Kins, MD on 03/15/24 for a complaint of elevated LFT's .    Patient is currently being seen by [facility]. He saw her for consult 06/2023 and then follow-up 12/2023. Per her last note: "Previous serologic evaluation notable for weakly positive ANA of 1: 40 with low IgG. No prior exposure or infection with hepatitis B or C and no evidence of immunity to hepatitis A or B. Evaluation for genetic liver disease was unremarkable.  He underwent transjugular biopsy with hepatic venous pressure measurements in July 2024. The quality of the specimen was core however did suggest F3 fibrosis. There was no active hepatitis on the biopsy no evidence of steatosis.   Hepatic venogram with no evidence of portal hypertension.  Patient has had thrombocytopenia since at least 2012. He also had prior evidence of splenomegaly.  He has longstanding mild elevation of alkaline phosphatase and GGT, previous alkaline phosphatase isoenzymes were unrevealing.   Patient has Metabolic dysfunction-associated steatohepatitis (MASH) there Kylen Schliep also be a component of congestive hepatopathy although this was not appreciated on biopsy.   They discussed GLP-1 versus Rezdiffra. Plan:  Lifestyle modification to reduce risk factors associated with steatotic liver disease Hepatoma screening: Right upper quadrant ultrasound Follow up in 6 month(s)   02/01/24 cardiology note, reviewed.  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  A bone marrow biopsy done in Joanthony Hamza 2024 revealed 7% plasma cells. Furthermore, since  his last visit, the patient has had an outside abdominal ultrasound which showed evidence of fatty liver disease and secondary splenomegaly.  These findings are the likely reason behind his baseline thrombocytopenia.  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 "  Interval History   Patient presents for evaluation of elevated liver enzymes. He last saw Duey Ghent, NP, at [facility] in January 2025 for follow-up on MASH and recommendations for patient to follow-up in 6 mths. Patient was unaware of any follow-up and thought he was here today to talk about constipation. Accompanied by his Aunt.       He presents today complaining of chronic constipation. He has bowel movement most days but can range from a small to large amount. He reports lower abdominal pain that is worse with constipation. If he has bowel movement the pain goes away.       Patient also has history of GERD and takes Pantoprazole  40 mg po daily.  Patient denies dysphagia. Patient denies nausea or vomiting.  He states he uses sucralfate  prn for abdominal pain. NO NSAID's.   Patient on Eliquis .   Patient has had recent changes to medications with nephrologist but unable to state what changes.  No alcohol use. Nonsmoker. 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  He has a left radiocephalic fistula and his ESRD is managed by Dr. Lydia Sams. Patient has Dialysis on Tues/Thurs/Saturday. Patient states he needs to lose 70 lbs to be able to meet criteria for transplant.   Patient's family history includes maternal grandfather with kidney CA and stomach  ulcers  His surgical history includes appendectomy, cardiac catheterization, cardioversion, CABG, and cholecystectomy.   Wt Readings from Last 3 Encounters:  04/22/24 (!) 371 lb (168.3 kg)  04/22/24 (!) 374 lb 6.4 oz (169.8 kg)  04/18/24 (!) 376 lb 11.2 oz (170.9 kg)     Past Medical History:  Diagnosis Date   Abnormal liver enzymes    a. Sees a doctor in [place].   Acute renal failure superimposed on stage 5 chronic kidney disease, not on chronic dialysis (HCC) 01/08/2024   AKI (acute kidney injury) (HCC) 06/17/2023   Cardiac cirrhosis    a. possible elevated LFTs/low platelets felt due to cardiac cirrhosis per 2017 admission.   Chronic combined systolic and diastolic CHF (congestive heart failure) (HCC)    Chronic kidney disease, stage 4 (severe) (HCC) 01/04/2016   Lab Results      Component    Value    Date/Time           GFR    29.24 (L)    09/30/2022 02:51 PM           GFR    27.27 (L)    09/24/2022 03:40 PM           GFR    24.10 (L)    09/03/2022 03:45 PM           GFR    18.98 (L)    08/25/2022 12:21 PM           GFR    38.98 (L)    04/24/2022 02:39 PM   Seeing [facility]rologist: Nan Aver, MD  Lastt plan 04/2023: # CKD stage IV    CKD (chronic kidney disease) stage 3, GFR 30-59 ml/min (HCC) 01/04/2016   Lab Results  Component  Value  Date/Time     GFR  29.24 (L)  09/30/2022 02:51 PM     GFR  27.27 (L)  09/24/2022 03:40 PM     GFR  24.10 (L)  09/03/2022 03:45 PM     GFR  18.98 (L)  08/25/2022 12:21 PM     GFR  38.98 (L)  04/24/2022 02:39 PM         CKD (chronic kidney disease), stage II    Stage 4   Congenital heart defect    a. rightward rotation of heart, almost dextrocardia   Coronary artery disease    a. s/p CABGx1 in 12/2015.   Gastric polyps 11/25/2022   Gastritis and gastroduodenitis 11/25/2022          Gastroesophageal reflux disease with esophagitis and hemorrhage 04/04/2021   Hematuria    a. Chronic hx of this, no prior etiology determined through workup per patient.   History of umbilical hernia repair 07/10/2011   History of hernia surgery twice prior   Hypercholesteremia    a. Prev taken off statin due to abnormal liver function.   Hypertension    Incisional hernia 07/10/2011   History of hernia surgery  twice prior   Morbid obesity (HCC)    Morbid obesity with BMI of 50.0-59.9, adult (HCC) 01/04/2016   After [place] [facility] doctor is setting him up with a wellness clinic to help with weight losS  He reports not having tried anything for weight loss either diet or or medicine or surgery in the past  We failed to get Wegovy  10/2022      Wt Readings from Last 10 Encounters:  11/13/22  (!) 384 lb 12.8 oz (174.5 kg)  10/13/22  Aaron Aas)  385 lb (174.6 kg)  09/30/22  (!) 384 lb (174.2 kg)  09/24/22  (!) 382 lb (1   Nausea and vomiting 11/25/2022   OSA on CPAP    Paroxysmal atrial flutter (HCC) 02/12/2016   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   LIMA to LAD   Sinus bradycardia    Thrombocytopenia (HCC)     Past Surgical History:  Procedure Laterality Date   APPENDECTOMY     AV FISTULA PLACEMENT Left 01/11/2024   Procedure: LEFT RADIOCEPHALIC ARTERIOVENOUS (AV) FISTULA CREATION;  Surgeon: Kayla Part, MD;  Location: [facility] OR;  Service: Vascular;  Laterality: Left;   BIOPSY  02/19/2021   Procedure: BIOPSY;  Surgeon: Lindle Rhea, MD;  Location: [facility] ENDOSCOPY;  Service: Gastroenterology;;   BIOPSY  11/25/2022   Procedure: BIOPSY;  Surgeon: Lindle Rhea, MD;  Location: [facility];  Service: Gastroenterology;;   CARDIAC CATHETERIZATION N/A 12/24/2015   Procedure: Right/Left Heart Cath and Coronary Angiography;  Surgeon: Mardell Shade, MD;  Location: [facility] INVASIVE CV LAB;  Service: Cardiovascular;  Laterality: N/A;   CARDIOVERSION N/A 02/14/2016   Procedure: CARDIOVERSION;  Surgeon: Hazle Lites, MD;  Location: [facility] ENDOSCOPY;  Service: Cardiovascular;  Laterality: N/A;   COLONOSCOPY WITH PROPOFOL  N/A 02/19/2021   Procedure: COLONOSCOPY WITH PROPOFOL ;  Surgeon: Lindle Rhea, MD;  Location: [facility]OSCOPY;  Service: Gastroenterology;  Laterality: N/A;   CORONARY ARTERY BYPASS GRAFT N/A 12/26/2015   Procedure: Off Pump Coronary Artery Bypass Grafting times one using left internal mammary artery;   Surgeon: Gardenia Jump, MD;  Location: [facility] OR;  Service: Open Heart Surgery;  Laterality: N/A;   ESOPHAGOGASTRODUODENOSCOPY (EGD) WITH PROPOFOL  N/A 02/19/2021   Procedure: ESOPHAGOGASTRODUODENOSCOPY (EGD) WITH PROPOFOL ;  Surgeon: Lindle Rhea, MD;  Location: [facility];  Service: Gastroenterology;  Laterality: N/A;   ESOPHAGOGASTRODUODENOSCOPY (EGD) WITH PROPOFOL  N/A 11/25/2022   Procedure: ESOPHAGOGASTRODUODENOSCOPY (EGD) WITH PROPOFOL ;  Surgeon: Lindle Rhea, MD;  Location: [facility];  Service: Gastroenterology;  Laterality: N/A;   GALLBLADDER SURGERY     HERNIA REPAIR     INSERTION OF DIALYSIS CATHETER Right 01/11/2024   Procedure: INSERTION OF TUNNELED  DIALYSIS CATHETER RIGHT INTERNAL JUGULAR;  Surgeon: Kayla Part, MD;  Location: [facility] OR;  Service: Vascular;  Laterality: Right;   IR TRANSCATHETER BX  06/26/2023   IR US  GUIDE VASC ACCESS RIGHT  06/26/2023   IR VENOGRAM HEPATIC W HEMODYNAMIC EVALUATION  06/26/2023   LEFT HEART CATH AND CORS/GRAFTS ANGIOGRAPHY N/A 04/15/2017   Procedure: Left Heart Cath and Cors/Grafts Angiography;  Surgeon: Millicent Ally, MD;  Location: [facility] INVASIVE CV LAB;  Service: Cardiovascular;  Laterality: N/A;   LEFT HEART CATH AND CORS/GRAFTS ANGIOGRAPHY N/A 11/02/2020   Procedure: LEFT HEART CATH AND CORS/GRAFTS ANGIOGRAPHY;  Surgeon: Arnoldo Lapping, MD;  Location: [facility] INVASIVE CV LAB;  Service: Cardiovascular;  Laterality: N/A;   POLYPECTOMY  02/19/2021   Procedure: POLYPECTOMY;  Surgeon: Lindle Rhea, MD;  Location: [facility]OSCOPY;  Service: Gastroenterology;;   TEE WITHOUT CARDIOVERSION N/A 12/26/2015   Procedure: TRANSESOPHAGEAL ECHOCARDIOGRAM (TEE);  Surgeon: Gardenia Jump, MD;  Location: [facility] OR;  Service: Open Heart Surgery;  Laterality: N/A;   TEE WITHOUT CARDIOVERSION N/A 02/14/2016   Procedure: TRANSESOPHAGEAL ECHOCARDIOGRAM (TEE);  Surgeon: Hazle Lites, MD;  Location: [facility] ENDOSCOPY;  Service: Cardiovascular;  Laterality: N/A;    Current  Outpatient Medications  Medication Sig Dispense Refill   amitriptyline  (ELAVIL ) 25 MG tablet TAKE 1 TABLET BY MOUTH EVERYDAY AT BEDTIME 30 tablet 0  azelastine  (ASTELIN ) 0.1 % nasal spray Place 2 sprays into both nostrils 2 (two) times daily. Use in each nostril as directed (Patient taking differently: Place 2 sprays into both nostrils as needed for allergies. Use in each nostril as directed) 30 mL 12   Cholecalciferol (VITAMIN D3) 125 MCG (5000 UT) CAPS Take 1 capsule (5,000 Units total) by mouth daily at 12 noon. 90 capsule 1   ELIQUIS  5 MG TABS tablet TAKE 1 TABLET(5 MG) BY MOUTH TWICE DAILY 180 tablet 1   folic acid -vitamin b complex-vitamin c-selenium-zinc (DIALYVITE) 3 MG TABS tablet Take 1 tablet by mouth daily.     folic acid -vitamin b complex-vitamin c-selenium-zinc (DIALYVITE) 3 MG TABS tablet Take 1 tablet by mouth daily.     iron sucrose in sodium chloride  0.9 % 100 mL Iron Sucrose (Venofer)     lanthanum (FOSRENOL) 1000 MG chewable tablet Chew 1,000 mg by mouth 3 (three) times daily with meals.     lanthanum (FOSRENOL) 1000 MG chewable tablet Chew 1,000 mg by mouth 2 (two) times daily with a meal.     losartan  (COZAAR ) 50 MG tablet Take 50 mg by mouth daily.     Methoxy PEG-Epoetin Beta (MIRCERA IJ) 75 mcg.     nitroGLYCERIN  (NITROSTAT ) 0.4 MG SL tablet Place 1 tablet (0.4 mg total) under the tongue every 5 (five) minutes as needed for chest pain. 25 tablet 6   nystatin  (MYCOSTATIN /NYSTOP ) powder Apply 1 Application topically 3 (three) times daily. 60 g 4   ondansetron  (ZOFRAN -ODT) 4 MG disintegrating tablet Take 1 tablet (4 mg total) by mouth every 8 (eight) hours as needed for nausea or vomiting. 30 tablet 2   polyethylene glycol (MIRALAX  / GLYCOLAX ) 17 g packet Mix 17 grams (1 packet) in 8 ounces of fluid and take by mouth daily. (Patient taking differently: Take 17 g by mouth daily as needed for moderate constipation.) 14 each 0   sucralfate  (CARAFATE ) 1 g tablet Take 1 tablet (1 g  total) by mouth at bedtime. 30 tablet 1   tirzepatide  (MOUNJARO ) 2.5 MG/0.5ML Pen Inject 2.5 mg into the skin once a week. 2 mL 3   triamcinolone  (NASACORT ) 55 MCG/ACT AERO nasal inhaler Place 1 spray into the nose daily. Start with 1 spray each side twice a day for 3 days, then reduce to daily. 1 each 2   triamcinolone  cream (KENALOG ) 0.1 % Apply 1 Application topically 2 (two) times daily.     acetaminophen  (TYLENOL ) 500 MG tablet Take by mouth once as needed for moderate pain (pain score 4-6), fever, headache or mild pain (pain score 1-3). (Patient not taking: Reported on 04/22/2024)     Acetaminophen  500 MG PACK Take by mouth. (Patient not taking: Reported on 04/22/2024)     albuterol  (VENTOLIN  HFA) 108 (90 Base) MCG/ACT inhaler Inhale 2 puffs into the lungs every 4 (four) hours as needed. (Patient not taking: Reported on 04/22/2024)     allopurinol  (ZYLOPRIM ) 100 MG tablet Take 1 tablet (100 mg total) by mouth daily. (Patient not taking: Reported on 04/22/2024)     calcium  acetate (PHOSLO ) 667 MG capsule Take 2 capsules (1,334 mg total) by mouth 3 (three) times daily with meals. (Patient not taking: Reported on 04/22/2024) 180 capsule 0   calcium  acetate (PHOSLO ) 667 MG capsule Take 667 mg by mouth 3 (three) times daily with meals. (Patient not taking: Reported on 04/22/2024)     Calcium  Carb-Cholecalciferol 600-25 MG-MCG CAPS Take by mouth. (Patient not taking: Reported on 04/22/2024)  Cyanocobalamin  (VITAMIN B-12 PO) Place 1 tablet under the tongue daily. (Patient not taking: Reported on 04/22/2024)     diltiazem  (CARDIZEM  SR) 120 MG 12 hr capsule TAKE 1 CAPSULE(120 MG) BY MOUTH TWICE DAILY (Patient not taking: Reported on 04/22/2024) 180 capsule 1   Evolocumab  (REPATHA  SURECLICK) 140 MG/ML SOAJ INJECT THE CONTENTS OF 1 SYRINGE UNDER THE SKIN EVERY 14 DAYS (Patient not taking: Reported on 04/22/2024) 6 mL 3   Evolocumab  (REPATHA  SURECLICK) 140 MG/ML SOAJ Inject 140 mg into the skin. (Patient not  taking: Reported on 04/22/2024)     famotidine -calcium  carbonate-magnesium  hydroxide (PEPCID  COMPLETE) 10-800-165 MG chewable tablet Chew 1 tablet by mouth 2 (two) times daily as needed. (Patient not taking: Reported on 04/22/2024) 100 tablet 11   metoprolol  succinate (TOPROL -XL) 25 MG 24 hr tablet Take 1 tablet by mouth. (Patient not taking: Reported on 04/22/2024)     montelukast  (SINGULAIR ) 10 MG tablet TAKE 1 TABLET(10 MG) BY MOUTH DAILY (Patient not taking: Reported on 04/22/2024) 90 tablet 1   pantoprazole  (PROTONIX ) 40 MG tablet TAKE 1 TABLET(40 MG) BY MOUTH TWICE DAILY (Patient not taking: Reported on 04/22/2024) 180 tablet 0   No current facility-administered medications for this visit.   Facility-Administered Medications Ordered in Other Visits  Medication Dose Route Frequency Provider Last Rate Last Admin   technetium tetrofosmin  (TC-MYOVIEW ) injection 29.8 millicurie  29.8 millicurie Intravenous Once PRN Hilty, Aviva Lemmings, MD        Allergies as of 04/22/2024 - Review Complete 04/22/2024  Allergen Reaction Noted   Glucophage  [metformin ] Other (See Comments) 06/17/2023   Vibra -tab [doxycycline ] Shortness Of Breath 11/15/2021   Chlorhexidine  Itching 02/05/2024   Chlorhexidine  gluconate Itching and Other (See Comments) 01/11/2024   Augmentin [amoxicillin-pot clavulanate] Itching 04/07/2016   Imdur  [isosorbide  dinitrate] Diarrhea 04/16/2017   Tape Rash 04/14/2017   Valacyclovir  Itching 12/30/2022    Family History  Problem Relation Age of Onset   CAD Father        4 stents ~ 81   Diabetes Father    Diabetes Maternal Grandfather    Diabetes Maternal Uncle    Heart attack Other    Hyperlipidemia Other     Review of Systems:    Constitutional: No weight loss, fever, chills, weakness or fatigue HEENT: Eyes: No change in vision               Ears, Nose, Throat:  No change in hearing or congestion Skin: No rash or itching Cardiovascular: No chest pain, chest pressure or  palpitations   Respiratory: No SOB or cough Gastrointestinal: See HPI and otherwise negative Genitourinary: No dysuria or change in urinary frequency Neurological: No headache, dizziness or syncope Musculoskeletal: No new muscle or joint pain Hematologic: No bleeding or bruising Psychiatric: No history of depression or anxiety    Physical Exam:  Vital signs: BP 122/68   Pulse 60   Ht 5\' 11"  (1.803 m)   Wt (!) 371 lb (168.3 kg)   BMI 51.74 kg/m   Constitutional:  Pleasant male appears to be in NAD, Well developed, Well nourished, obese, alert and cooperative Eyes:   PEERL, EOMI. No icterus. Conjunctiva pink. Throat: Oral cavity and pharynx without inflammation, swelling or lesion.  Respiratory: Respirations even and unlabored. Lungs clear to auscultation bilaterally.   No wheezes, crackles, or rhonchi.  Cardiovascular: Normal S1, S2. Regular rate and rhythm. Bilateral lower extremity edema +2 with compression stockings Gastrointestinal:  Soft, nondistended, obese, nontender. No rebound or guarding. Normal bowel sounds.  No appreciable masses or hepatomegaly. Rectal:  Not performed.  Msk:  Symmetrical without gross deformities.  Neurologic:  Alert and  oriented x4;  grossly normal neurologically. No asterixis  Skin:   Dry and intact without significant lesions or rashes. Psychiatric: Oriented to person, place and time. Demonstrates good judgement and reason without abnormal affect or behaviors.  RELEVANT LABS AND IMAGING: CBC    Latest Ref Rng & Units 03/14/2024    2:50 PM 01/18/2024    8:51 AM 01/16/2024    3:10 AM  CBC  WBC 4.0 - 10.5 K/uL 7.8  7.4  7.0   Hemoglobin 13.0 - 17.0 g/dL 40.9  81.1  91.4   Hematocrit 39.0 - 52.0 % 32.0  33.4  30.9   Platelets 150.0 - 400.0 K/uL 89.0  70  62      CMP     Latest Ref Rng & Units 03/14/2024    2:50 PM 01/16/2024    3:10 AM 01/15/2024    3:34 AM  CMP  Glucose 70 - 99 mg/dL 782  956  213   BUN 6 - 23 mg/dL 69  50  63   Creatinine  0.40 - 1.50 mg/dL 08.65  7.84  6.96   Sodium 135 - 145 mEq/L 137  138  138   Potassium 3.5 - 5.1 mEq/L 4.3  3.4  3.5   Chloride 96 - 112 mEq/L 98  103  103   CO2 19 - 32 mEq/L 22  25  24    Calcium  8.4 - 10.5 mg/dL 9.2  8.6  8.5   Total Protein 6.0 - 8.3 g/dL 6.1     Total Bilirubin 0.2 - 1.2 mg/dL 1.6     Alkaline Phos 39 - 117 U/L 244     AST 0 - 37 U/L 55     ALT 0 - 53 U/L 64        Lab Results  Component Value Date   TSH 2.070 03/14/2024  04/16/23 echo- Left ventricular ejection fraction, by estimation, is 50 to 55%.  Imaging:   01/15/2021  ULTRASOUND HEPATIC ELASTOGRAPHY  Median kPa: 3.7 IQR: 1.0 IQR/Median kPa ratio: 0.3 Data quality: IQR/Median kPa ratio of 0.3 or greater indicates reduced accuracy  BIOPSY RESULTS: 04/10/2023 BONE MARROW, ASPIRATE, CLOT, CORE: -Slightly hypercellular bone marrow for age with plasma cell neoplasm PERIPHERAL BLOOD: -Mild macrocytic anemia -Thrombocytopenia COMMENT: The bone marrow is slightly hypercellular for age with increased number of plasma cells representing 7% of all cells in the aspirate with lack of large aggregates or sheets. The plasma cells display kappa light chain restriction consistent with plasma cell neoplasm. The background shows trilineage hematopoiesis with scattering of atypical megakaryocytes, possibly secondary in nature especially in the setting of ITP, kidney disease, etc. Nonetheless, correlation with cytogenetic and FISH studies is recommended.   12/24/23 US  Abd limited RUQ  IMPRESSION: 1. Increased hepatic parenchymal echogenicity suggestive of steatosis. 2. Status post cholecystectomy.  IMAGING: 11/02/2023 CT ABDOMEN AND PELVIS WITHOUT CONTRAST   TECHNIQUE:  Multidetector CT imaging of the abdomen and pelvis was performed  following the standard protocol without IV contrast.   RADIATION DOSE REDUCTION: This exam was performed according to the  departmental dose-optimization program which includes  automated  exposure control, adjustment of the mA and/or kV according to  patient size and/or use of iterative reconstruction technique.   COMPARISON: 01/26/2023   FINDINGS:  Lower chest: Lung bases are clear. Postoperative changes in the  mediastinum. Elevation of the left  hemidiaphragm is unchanged.   Hepatobiliary: No focal liver abnormality is seen. Status post  cholecystectomy. No biliary dilatation.   Pancreas: Unremarkable. No pancreatic ductal dilatation or  surrounding inflammatory changes.   Spleen: Normal in size without focal abnormality.   Adrenals/Urinary Tract: Adrenal glands are unremarkable. Kidneys are  normal, without renal calculi, focal lesion, or hydronephrosis.  Bladder is unremarkable.   Stomach/Bowel: Stomach, small bowel, and colon are not abnormally  distended. Scattered stool in the colon. No wall thickening or  inflammatory changes. Appendix is not identified.   Vascular/Lymphatic: Aortic atherosclerosis. No enlarged abdominal or  pelvic lymph nodes.   Reproductive: Prostate gland is atrophic or surgically absent. No  pelvic mass or lymphadenopathy.   Other: No free air or free fluid in the abdomen. Small periumbilical  hernia containing fat. Cystic structure in the left periumbilical  space measuring 4 cm diameter. This is likely a sebaceous cyst. No  change since previous study.   Musculoskeletal: Degenerative changes in the spine. Spondylolysis  with mild spondylolisthesis at L4-5.   IMPRESSION:  1. No acute process demonstrated in the abdomen or pelvis. No  significant change since previous study.  2. Chronic elevation of the left hemidiaphragm.  3. Mild aortic atherosclerosis.  4. Cystic structure adjacent to the umbilicus likely representing a  sebaceous cyst. No change.  07/17/2023 ULTRASOUND ABDOMEN LIMITED RIGHT UPPER QUADRANT   COMPARISON: September 23, 2022   FINDINGS:  Gallbladder:   Surgically removed.   Common bile duct:    Diameter: 2.9 mm   Liver:   Increased echotexture. No focal liver lesion. Portal vein is patent  on color Doppler imaging with normal direction of blood flow towards  the liver.   Other: None.   IMPRESSION:  1. Prior cholecystectomy.  2. Increased echotexture of the liver. This is a nonspecific finding  but can be seen in hepatic steatosis.   06/26/2023 HEPATIC VENOGRAM  IVC pressure: atrial pressure: 0-66mmHgFree hepatic vein  pressure: hepatic vein pressure: 45mmHg9-3= . Portal  vein pressure = , within normal   09/23/2022 EXAM: ABDOMEN ULTRASOUND COMPLETE  COMPARISON: Abdominal ultrasound dated 04/07/2022.  FINDINGS: Gallbladder: Surgically absent. No sonographic Murphy sign noted by sonographer.  Common bile duct: Diameter: 4 mm  Liver: No focal lesion identified. Increased parenchymal echogenicity. Portal vein is patent on color Doppler imaging with normal direction of blood flow towards the liver.  IVC: No abnormality visualized.  Pancreas: Visualized portion unremarkable.  Spleen: Size and appearance within normal limits.  Right Kidney: Length: 9.8 cm. Echogenicity within normal limits. No mass or hydronephrosis visualized.  Left Kidney: Length: 10.5 cm. Echogenicity within normal limits. No mass or hydronephrosis visualized.  Abdominal aorta: No aneurysm visualized.  Other findings: None.  IMPRESSION: 1. Hepatic steatosis. Please note limited evaluation for focal hepatic masses in a patient with hepatic steatosis due to decreased penetration of the acoustic ultrasound waves.  02/02/2021 MRI w/o Hepatobiliary: Hepatic parenchyma is grossly unremarkable on limited  imaging. Gallbladder surgically absent. Mild prominence of the intra  and extrahepatic   06/26/23 IR transjugular Biopsy: 06/26/2023 LIVER, BIOPSY:  - Heavily fragmented core of liver with essentially unremarkable  hepatic parenchyma and portal tracts with  mild portal fibrosis, mild  chronic inflammation and questionable bile ductular proliferation.  Note: It is noted that the patient has a complex medical history  including acute on chronic kidney injury, chronic thrombocytopenia, a  plasma cell neoplasm, diabetes, coronary artery disease with heart  failure and reduced ejection  fraction as well as morbid obesity.   The hepatic parenchyma itself is overall unremarkable without the  presence of steatosis, ballooning or apoptotic bodies. It should be  noted there is quite a bit of fragmentation which can be a sign of a  greater degree of fibrosis/cirrhosis then can be identified on the  needle core biopsy given a diminished number of portal triads. The  portal triads available for evaluation do show a mild degree of portal  fibrosis but without definitive evidence of periportal or bridging type  fibrosis. There is the presence of questionable bile ductular reaction  which again raises the possibility of fibrosis that is not as readily  identifiable on the HE/trichrome stains. There is no central vein or  perisinusoidal/pericellular fibrosis. An iron stain shows no stainable  iron a PAS and reticulin stain are also reviewed in the workup of the  case.  In summary, there is no ongoing hepatocellular damage identified  including the presence of steatosis. 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 If  clinically indicated a repeat biopsy could be considered.   Procedures: 02/19/21 colonoscopy with Dr. Savannah Curlin, recall 2028 Impression: - One 3 mm polyp in the transverse colon, removed with a cold snare. Resected and retrieved. - The examination was otherwise normal on direct and retroflexion views. Path:  F. COLON, TRANSVERSE, POLYPECTOMY: -  Tubular  adenoma (1 of 1 fragments) -  No high-grade dysplasia or malignancy identified  04/22/21 virtual colonoscopy  IMPRESSION: 1. No polyp, stricture or mass. 2.  Aortic atherosclerosis (ICD10-I70.0). 02/19/21 EGD Impression: - Normal esophagus. Biopsied. - Erosive gastropathy with no bleeding and no stigmata of recent bleeding. Biopsied. - One gastric polyp. Biopsied. - Erythematous duodenopathy. Biopsied. Path: A. DUODENUM, BIOPSY:  -  Benign duodenal mucosa with focal villous blunting  -  No acute inflammation or increased intraepithelial lymphocytes  identified   B. GASTRIC, RANDOM, FUNDUS, ANTRUM, BODY, BIOPSY:  -  Reactive gastropathy with intestinal metaplasia and patchy chronic  inflammation  - Focal features consistent with proton pump inhibitor effect  -  No H. pylori identified  -  See comment   C. GASTRIC, POLYPECTOMY:  -  Hyperplastic gastric polyp(s)  -  No H. pylori, intestinal metaplasia or malignancy identified   D. ESOPHAGUS, DISTAL, BIOPSY:  -  Benign squamous mucosa  -  No increased intraepithelial eosinophils   E. ESOPHAGUS, MID PROXIMAL, BIOPSY:  -  Benign squamous mucosa  -  No increased intraepithelial eosinophils   EGD 11/25/2022 with Dr. Savannah Curlin  - Normal esophagus. - Three gastric polyps. Some spontaneous oozing. Biopsied. - Abnormal gastric mucosa with both erythema and loss of vascular pattern. Biopsied. - No evidence for portal hypertensive gastropathy, esophageal varices, or gastric varices. - Erythematous duodenopathy. Biopsied A. DUODENUM, BIOPSY: - Duodenal mucosa with prominent mucosal lymphocytic aggregates. No intraepithelial lymphocytes, villous atrophy or malignancy. No granulomas identified. - Helicobacter pylori immunostaining negative.  B. STOMACH, POLYPECTOMY: - Hyperplastic polyp. No intestinal metaplasia, dysplasia or malignancy.  C. STOMACH, ANTRUM, LESSER CURVE, BIOPSY: - Reactive gastropathy. - Intestinal metaplasia. No dysplasia  or malignancy.  D. STOMACH, ANTRUM, GREATER CURVE, BIOPSY: - Reactive gastropathy. No intestinal metaplasia, dysplasia or malignancy. - Focal chronic gastritis.  E. STOMACH, BODY, BIOPSY: - Gastric mucosa with patchy chronic gastritis. Helicobacter pylori immunostaining negative. -  No intestinal metaplasia, dysplasia or malignancy.  F. STOMACH, INCISURA, BIOPSY: - Focal chronic gastritis. Helicobacter pylori immunostaining negative. - No intestinal metaplasia, dysplasia or malignancy.  G. STOMACH, FUNDUS, BIOPSY: - Patchy chronic gastritis. Helicobacter pylori immunostaining negative. - No intestinal metaplasia, dysplasia or malignancy.   Assessment:    Patient has had extensive liver workup with Duey Ghent, NP at [facility] with recommendations to follow-up in 6 mths. Patient will call her office for appointment. History of MASH, cardiac cirrhosis. Persistently elevated LFTs (AST 55, ALT 64, Alk Phos 244, T.Bili 1.6) and low albumin  (3.3), thrombocytopenia (89.0), likely multifactorial (liver disease, cardiac component, MGUS?). 12/2023 Abd ultrasound with no focal lesion.    History of Gastric intestinal metaplasia:  Repeat EGD recommended for gastric mapping 2025 per Dr. Savannah Curlin. Will defer to Dr. Rosaline Coma. He would need to be done at hospital due to BMI and ESRD.    For the [facility] we discussed daily Miralax  OTC versus prn to regulate bowel. UTD on colonoscopy, recall 5/ 2028 Plan: - Continue OTC Miralax  po daily -Follow-up with [facility] as scheduled  -History of gastric intestinal metaplasia: Defer repeat EGD recommended for gastric mapping 2025 to Dr. Rosaline Coma -recall colonoscopy 04/2027   Thank you for the courtesy of this consult. Please call me with any questions or concerns.   Mario Voong, FNP-C [facility] 04/22/2024, 5:18 PM  Cc: Anthon Kins, MD

## 2024-04-22 NOTE — Progress Notes (Signed)
 ==============================  [facility]: [phone]   -- Medical Office Visit --  Patient: Sean [name]      Age: 53 y.o.       Sex:  male  Date:   04/22/2024 Today's Healthcare Provider: Anthon Kins, MD  ==============================   Chief Complaint: Hypertension and Chronic Kidney Disease  Discussed the use of AI scribe software for clinical note transcription with the patient, who gave verbal consent to proceed.  History of Present Illness  53 year old male with end-stage renal disease on dialysis who presents for follow-up regarding dialysis management and medication adjustments.  He is experiencing cramping during and after dialysis sessions, primarily in his legs, starting about an hour to an hour and a half after returning home. Despite extending his session time from four hours to four hours and fifteen minutes, he still experienced cramps after the last session. The fluid removal amount was increased to three liters, which he suspects may contribute to the cramping.  He has a history of nausea, which he attributes to high blood urea nitrogen levels. He was previously on Mounjaro  but stopped due to nausea concerns. His kidney doctor has advised him to resume it, but he has not yet restarted the medication.  His medication regimen was recently adjusted. He was switched from a fast-releasing blood pressure medication to a slow-releasing metoprolol  three days ago. He was also taken off a calcium -containing binder and started on a different binder. He continues to take losartan  50 mg daily, Cardizem  120 mg, and Repatha  140 mg every two weeks. He uses an albuterol  inhaler as needed.  He reports a change in bowel habits since starting dialysis, with stools becoming harder and more pellet-like. His fluid intake is restricted to 32 ounces a day due to his dialysis regimen.  He has a history of fatty liver disease and is taking  Prilosec daily for heartburn. He reports occasional mild abdominal discomfort, which he attributes to his skin folds. No current diarrhea but experiences occasional shortness of breath.  BP Readings from Last 30 Encounters:  04/22/24 122/68  04/22/24 (!) 108/58  04/18/24 (!) 100/51  03/23/24 112/86  03/14/24 110/60  02/12/24 126/76  02/05/24 115/71  02/01/24 138/72  01/29/24 (!) 144/81  01/16/24 125/67  01/05/24 (!) 144/70  01/01/24 (!) 148/82  12/14/23 (!) 168/87  12/01/23 (!) 156/76  12/01/23 (!) 154/72  11/09/23 (!) 147/77  11/04/23 137/62  11/02/23 (!) 147/83  10/08/23 (!) 151/69  07/27/23 (!) 172/80  07/02/23 120/63  06/26/23 (!) 158/66  06/19/23 129/64  06/18/23 131/79  05/04/23 137/75  04/30/23 (!) 154/74  04/15/23 (!) 150/72  04/03/23 (!) 153/77  03/02/23 121/71  02/17/23 130/70     Wt Readings from Last 10 Encounters:  04/22/24 (!) 371 lb (168.3 kg)  04/22/24 (!) 374 lb 6.4 oz (169.8 kg)  04/18/24 (!) 376 lb 11.2 oz (170.9 kg)  03/23/24 (!) 373 lb 9.6 oz (169.5 kg)  03/14/24 (!) 377 lb 12.8 oz (171.4 kg)  02/12/24 (!) 375 lb (170.1 kg)  02/05/24 (!) 375 lb 3.2 oz (170.2 kg)  02/01/24 (!) 381 lb 3.2 oz (172.9 kg)  01/29/24 (!) 381 lb 4.8 oz (173 kg)  01/16/24 (!) 382 lb 0.9 oz (173.3 kg)        Background Reviewed: Problem List: has History of umbilical hernia repair; Hypertension; Hyperlipidemia; Thrombocytopenia (HCC); Sinus bradycardia; Atherosclerotic heart disease of native coronary artery without angina pectoris; Atrial flutter with rapid  ventricular response (HCC); Morbid obesity (HCC); Other long term (current) drug therapy; History of coronary artery bypass surgery; Obstructive sleep apnea syndrome; Chronic gout of multiple sites; Alkaline phosphatase elevation; Gout; Ectatic aorta (HCC); Gastroesophageal reflux disease without esophagitis; Herpes simplex; Metabolic dysfunction-associated steatohepatitis (MASH); Prediabetes; Statins  contraindicated; Hypothyroid; Chronic venous stasis dermatitis of both lower extremities; Nausea and vomiting; Chronic liver failure without hepatic coma (HCC); Splenomegaly; Spondylolisthesis; Bleeding diathesis (HCC); Malaise and fatigue; Allergic rhinitis due to pollen; Amitriptyline  adverse reaction; MGUS (monoclonal gammopathy of unknown significance); 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; Rectal bleeding; Recurrent sinusitis; Asthma, chronic; ESRD (end stage renal disease) (HCC); SVT (supraventricular tachycardia) (HCC); PUD (peptic ulcer disease); Disability affecting daily living; Inflamed internal hemorrhoid; Anemia in chronic kidney disease; Chronic combined systolic (congestive) and diastolic (congestive) heart failure (HCC); Coagulation defect, unspecified (HCC); Dependence on renal dialysis (HCC); Gout due to renal impairment, unspecified site; Hypertensive chronic kidney disease with stage 5 chronic kidney disease or end stage renal disease (HCC); Mild protein-calorie malnutrition (HCC); Other chronic pain; Other disorders of phosphorus metabolism; Other fluid overload; Iron deficiency anemia, unspecified; Other iron deficiency anemias; Pruritus, unspecified; Pure hypercholesterolemia, unspecified; Secondary hyperparathyroidism of renal origin (HCC); Shortness of breath; and Abnormal bowel habits on their problem list. Past Medical History:  has a past medical history of Abnormal liver enzymes, Acute renal failure superimposed on stage 5 chronic kidney disease, not on chronic dialysis (HCC) (01/08/2024), AKI (acute kidney injury) (HCC) (06/17/2023), Cardiac cirrhosis, Chronic combined systolic and diastolic CHF (congestive heart failure) (HCC), Chronic kidney disease, stage 4 (severe) (HCC) (01/04/2016), CKD (chronic kidney disease) stage 3, GFR 30-59 ml/min (HCC) (01/04/2016), CKD (chronic kidney disease), stage II, Congenital heart defect, Coronary artery disease, Gastric polyps  (11/25/2022), Gastritis and gastroduodenitis (11/25/2022), Gastroesophageal reflux disease with esophagitis and hemorrhage (04/04/2021), Hematuria, History of umbilical hernia repair (07/10/2011), Hypercholesteremia, Hypertension, Incisional hernia (07/10/2011), Morbid obesity (HCC), Morbid obesity with BMI of 50.0-59.9, adult (HCC) (01/04/2016), Nausea and vomiting (11/25/2022), OSA on CPAP, Paroxysmal atrial flutter (HCC) (02/12/2016), 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 Past Surgical History:   has a past surgical history that includes Gallbladder surgery; Appendectomy; Hernia repair; Cardiac catheterization (N/A, 12/24/2015); Coronary artery bypass graft (N/A, 12/26/2015); TEE without cardioversion (N/A, 12/26/2015); Cardioversion (N/A, 02/14/2016); TEE without cardioversion (N/A, 02/14/2016); LEFT HEART CATH AND CORS/GRAFTS ANGIOGRAPHY (N/A, 04/15/2017); LEFT HEART CATH AND CORS/GRAFTS ANGIOGRAPHY (N/A, 11/02/2020); Colonoscopy with propofol  (N/A, 02/19/2021); Esophagogastroduodenoscopy (egd) with propofol  (N/A, 02/19/2021); biopsy (02/19/2021); polypectomy (02/19/2021); Esophagogastroduodenoscopy (egd) with propofol  (N/A, 11/25/2022); biopsy (11/25/2022); IR Transcatheter BX (06/26/2023); IR US  Guide Vasc Access Right (06/26/2023); IR Venogram Hepatic W Hemodynamic Evaluation (06/26/2023); AV fistula placement (Left, 01/11/2024); and Insertion of dialysis catheter (Right, 01/11/2024). Social History:   reports that he has never smoked. He has never used smokeless tobacco. He reports current alcohol use. He reports that he does not use drugs. Family History:  family history includes CAD in his father; Diabetes in his father, maternal grandfather, and maternal uncle; Heart attack in an other family member; Hyperlipidemia in an other family member. Allergies:  is allergic to glucophage  [metformin ], vibra -tab [doxycycline ], chlorhexidine , chlorhexidine  gluconate, augmentin  [amoxicillin-pot clavulanate], imdur  [isosorbide  dinitrate], tape, and valacyclovir .   Medication Reconciliation: Current Outpatient Medications on File Prior to Visit  Medication Sig   amitriptyline  (ELAVIL ) 25 MG tablet TAKE 1 TABLET BY MOUTH EVERYDAY AT BEDTIME   azelastine  (ASTELIN ) 0.1 % nasal spray Place 2 sprays into both nostrils 2 (two) times daily. Use in each  nostril as directed (Patient taking differently: Place 2 sprays into both nostrils as needed for allergies. Use in each nostril as directed)   ELIQUIS  5 MG TABS tablet TAKE 1 TABLET(5 MG) BY MOUTH TWICE DAILY   folic acid -vitamin b complex-vitamin c-selenium-zinc (DIALYVITE) 3 MG TABS tablet Take 1 tablet by mouth daily.   iron sucrose in sodium chloride  0.9 % 100 mL Iron Sucrose (Venofer)   lanthanum (FOSRENOL) 1000 MG chewable tablet Chew 1,000 mg by mouth 3 (three) times daily with meals.   lanthanum (FOSRENOL) 1000 MG chewable tablet Chew 1,000 mg by mouth 2 (two) times daily with a meal.   losartan  (COZAAR ) 50 MG tablet Take 50 mg by mouth daily.   Methoxy PEG-Epoetin Beta (MIRCERA IJ) 75 mcg.   nitroGLYCERIN  (NITROSTAT ) 0.4 MG SL tablet Place 1 tablet (0.4 mg total) under the tongue every 5 (five) minutes as needed for chest pain.   nystatin  (MYCOSTATIN /NYSTOP ) powder Apply 1 Application topically 3 (three) times daily.   ondansetron  (ZOFRAN -ODT) 4 MG disintegrating tablet Take 1 tablet (4 mg total) by mouth every 8 (eight) hours as needed for nausea or vomiting.   sucralfate  (CARAFATE ) 1 g tablet Take 1 tablet (1 g total) by mouth at bedtime.   Cholecalciferol (VITAMIN D3) 125 MCG (5000 UT) CAPS Take 1 capsule (5,000 Units total) by mouth daily at 12 noon.   triamcinolone  (NASACORT ) 55 MCG/ACT AERO nasal inhaler Place 1 spray into the nose daily. Start with 1 spray each side twice a day for 3 days, then reduce to daily.   triamcinolone  cream (KENALOG ) 0.1 % Apply 1 Application topically 2 (two) times daily.   Current  Facility-Administered Medications on File Prior to Visit  Medication   technetium tetrofosmin  (TC-MYOVIEW ) injection 29.8 millicurie   Medications Discontinued During This Encounter  Medication Reason   metoprolol  tartrate (LOPRESSOR ) 25 MG tablet Completed Course   ketoconazole  (NIZORAL ) 2 % cream Completed Course   fluticasone  (FLONASE ) 50 MCG/ACT nasal spray    diclofenac  Sodium (VOLTAREN ) 1 % GEL    B Complex-C-Folic Acid  (DIALYVITE TABLET) TABS    tirzepatide  (MOUNJARO ) 2.5 MG/0.5ML Pen Reorder   acetaminophen  (TYLENOL ) 500 MG tablet    Cyanocobalamin  (VITAMIN B-12 PO)    montelukast  (SINGULAIR ) 10 MG tablet    polyethylene glycol (MIRALAX  / GLYCOLAX ) 17 g packet    allopurinol  (ZYLOPRIM ) 100 MG tablet    famotidine -calcium  carbonate-magnesium  hydroxide (PEPCID  COMPLETE) 10-800-165 MG chewable tablet    calcium  acetate (PHOSLO ) 667 MG capsule    diltiazem  (CARDIZEM  SR) 120 MG 12 hr capsule    Calcium  Carb-Cholecalciferol 600-25 MG-MCG CAPS    Evolocumab  (REPATHA  SURECLICK) 140 MG/ML SOAJ    Acetaminophen  500 MG PACK    pantoprazole  (PROTONIX ) 40 MG tablet    metoprolol  succinate (TOPROL -XL) 25 MG 24 hr tablet    albuterol  (VENTOLIN  HFA) 108 (90 Base) MCG/ACT inhaler    calcium  acetate (PHOSLO ) 667 MG capsule    Evolocumab  (REPATHA  SURECLICK) 140 MG/ML SOAJ    folic acid -vitamin b complex-vitamin c-selenium-zinc (DIALYVITE) 3 MG TABS tablet      Physical Exam:    04/22/2024   11:03 AM 04/22/2024    9:09 AM 04/18/2024    1:33 PM  Vitals with BMI  Height 5\' 11"  5\' 11"    Weight 371 lbs 374 lbs 6 oz 376 lbs 11 oz  BMI 51.77 52.24   Systolic 122 108 960  Diastolic 68 58 51  Pulse 60 72 59  Vital signs reviewed.  Nursing  notes reviewed. Weight trend reviewed. Physical Exam General Appearance:  No acute distress appreciable.   Well-groomed, healthy-appearing male.  Well proportioned with no abnormal fat distribution.  Good muscle tone. Pulmonary:  Normal work of breathing at  rest, no respiratory distress apparent. SpO2: 98 %  Musculoskeletal: All extremities are intact.  Neurological:  Awake, alert, oriented, and engaged.  No obvious focal neurological deficits or cognitive impairments.  Sensorium seems unclouded.   Speech is clear and coherent with logical content. Psychiatric:  Appropriate mood, pleasant and cooperative demeanor, thoughtful and engaged during the exam Physical Exam SKIN: Normal  Truncal adiposity      No results found for any visits on 04/22/24. Scanned Document on 03/24/2024  Component Date Value   Creatinine, POC 05/30/2023 95.2    EGFR 05/30/2023 18.0   Office Visit on 03/14/2024  Component Date Value   Cholesterol 03/14/2024 208 (H)    Triglycerides 03/14/2024 167.0 (H)    HDL 03/14/2024 50.60    VLDL 03/14/2024 33.4    LDL Cholesterol 03/14/2024 124 (H)    Total CHOL/HDL Ratio 03/14/2024 4    NonHDL 03/14/2024 157.38    Sodium 03/14/2024 137    Potassium 03/14/2024 4.3    Chloride 03/14/2024 98    CO2 03/14/2024 22    Glucose, Bld 03/14/2024 152 (H)    BUN 03/14/2024 69 (H)    Creatinine, Ser 03/14/2024 10.96 (HH)    Total Bilirubin 03/14/2024 1.6 (H)    Alkaline Phosphatase 03/14/2024 244 (H)    AST 03/14/2024 55 (H)    ALT 03/14/2024 64 (H)    Total Protein 03/14/2024 6.1    Albumin  03/14/2024 3.3 (L)    GFR 03/14/2024 4.94 (LL)    Calcium  03/14/2024 9.2    WBC 03/14/2024 7.8    RBC 03/14/2024 3.07 (L)    Hemoglobin 03/14/2024 11.0 (L)    HCT 03/14/2024 32.0 (L)    MCV 03/14/2024 104.2 (H)    MCHC 03/14/2024 34.4    RDW 03/14/2024 15.9 (H)    Platelets 03/14/2024 89.0 (L)    Neutrophils Relative % 03/14/2024 59.4    Lymphocytes Relative 03/14/2024 17.6    Monocytes Relative 03/14/2024 16.0 (H)    Eosinophils Relative 03/14/2024 6.6 (H)    Basophils Relative 03/14/2024 0.4    Neutro Abs 03/14/2024 4.6    Lymphs Abs 03/14/2024 1.4    Monocytes Absolute 03/14/2024 1.2 (H)    Eosinophils Absolute 03/14/2024  0.5    Basophils Absolute 03/14/2024 0.0    TSH 03/14/2024 2.070    Hgb A1c MFr Bld 03/14/2024 6.1    Magnesium  03/14/2024 2.4    Phosphorus 03/14/2024 8.1 (H)   Appointment on 01/18/2024  Component Date Value   WBC Count 01/18/2024 7.4    RBC 01/18/2024 3.33 (L)    Hemoglobin 01/18/2024 11.4 (L)    HCT 01/18/2024 33.4 (L)    MCV 01/18/2024 100.3 (H)    MCH 01/18/2024 34.2 (H)    MCHC 01/18/2024 34.1    RDW 01/18/2024 14.8    Platelet Count 01/18/2024 70 (L)    nRBC 01/18/2024 0.00    Neutrophils Relative % 01/18/2024 48    Neutro Abs 01/18/2024 3.6    Lymphocytes Relative 01/18/2024 26    Lymphs Abs 01/18/2024 1.9    Monocytes Relative 01/18/2024 16    Monocytes Absolute 01/18/2024 1.2 (H)    Eosinophils Relative 01/18/2024 8    Eosinophils Absolute 01/18/2024 0.6 (H)    Basophils Relative 01/18/2024 1  Basophils Absolute 01/18/2024 0.1    Immature Granulocytes 01/18/2024 1    Abs Immature Granulocytes 01/18/2024 0.07    nRBC 01/18/2024 0    Immature Platelet Fracti* 01/18/2024 0.3 (L)    Ferritin 01/18/2024 114    Iron 01/18/2024 152    TIBC 01/18/2024 456 (H)    Saturation Ratios 01/18/2024 33    UIBC 01/18/2024 304    Vitamin B-12 01/18/2024 6,881 (H)    IgG (Immunoglobin G), Se* 01/18/2024 561 (L)    IgA 01/18/2024 1,203 (H)    IgM (Immunoglobulin M), * 01/18/2024 91    Total Protein ELP 01/18/2024 5.9 (L)    Albumin  SerPl Elph-Mcnc 01/18/2024 2.8 (L)    Alpha 1 01/18/2024 0.3    Alpha2 Glob SerPl Elph-M* 01/18/2024 0.6    B-Globulin SerPl Elph-Mc* 01/18/2024 1.7 (H)    Gamma Glob SerPl Elph-Mc* 01/18/2024 0.4    M Protein SerPl Elph-Mcnc 01/18/2024 0.9 (H)    Globulin, Total 01/18/2024 3.1    Albumin /Glob SerPl 01/18/2024 1.0    IFE 1 01/18/2024 Comment (A)    Please Note 01/18/2024 Comment    Kappa free light chain 01/18/2024 1,099.9 (H)    Lambda free light chains 01/18/2024 33.2 (H)    Kappa, lambda light chai* 01/18/2024 33.13 (H)   No results  displayed because visit has over 200 results.    Admission on 01/05/2024, Discharged on 01/05/2024  Component Date Value   WBC 01/05/2024 6.5    RBC 01/05/2024 3.17 (L)    Hemoglobin 01/05/2024 10.8 (L)    HCT 01/05/2024 32.8 (L)    MCV 01/05/2024 103.5 (H)    MCH 01/05/2024 34.1 (H)    MCHC 01/05/2024 32.9    RDW 01/05/2024 14.2    Platelets 01/05/2024 66 (L)    nRBC 01/05/2024 0.0    Neutrophils Relative % 01/05/2024 54    Neutro Abs 01/05/2024 3.6    Lymphocytes Relative 01/05/2024 22    Lymphs Abs 01/05/2024 1.4    Monocytes Relative 01/05/2024 12    Monocytes Absolute 01/05/2024 0.8    Eosinophils Relative 01/05/2024 10    Eosinophils Absolute 01/05/2024 0.6 (H)    Basophils Relative 01/05/2024 1    Basophils Absolute 01/05/2024 0.1    Immature Granulocytes 01/05/2024 1    Abs Immature Granulocytes 01/05/2024 0.05    Sodium 01/05/2024 142    Potassium 01/05/2024 3.9    Chloride 01/05/2024 107    CO2 01/05/2024 24    Glucose, Bld 01/05/2024 151 (H)    BUN 01/05/2024 57 (H)    Creatinine, Ser 01/05/2024 6.95 (H)    Calcium  01/05/2024 8.9    Total Protein 01/05/2024 5.9 (L)    Albumin  01/05/2024 2.4 (L)    AST 01/05/2024 27    ALT 01/05/2024 26    Alkaline Phosphatase 01/05/2024 140 (H)    Total Bilirubin 01/05/2024 0.7    GFR, Estimated 01/05/2024 9 (L)    Anion gap 01/05/2024 11    Magnesium  01/05/2024 2.4    B Natriuretic Peptide 01/05/2024 430.6 (H)    SARS Coronavirus 2 by RT* 01/05/2024 NEGATIVE    Influenza A by PCR 01/05/2024 NEGATIVE    Influenza B by PCR 01/05/2024 NEGATIVE    Resp Syncytial Virus by * 01/05/2024 NEGATIVE   Scanned Document on 12/28/2023  Component Date Value   PTH 12/23/2023 177    Calcium  12/23/2023 9.1    EGFR 12/23/2023 10.0   Office Visit on 12/14/2023  Component Date Value  WBC 12/14/2023 6.3    RBC 12/14/2023 3.69 (L)    Hemoglobin 12/14/2023 12.6 (L)    HCT 12/14/2023 37.0 (L)    MCV 12/14/2023 100.3 (H)    MCHC  12/14/2023 34.0    RDW 12/14/2023 14.3    Platelets 12/14/2023 70.0 (L)    Neutrophils Relative % 12/14/2023 67.3    Lymphocytes Relative 12/14/2023 16.1    Monocytes Relative 12/14/2023 11.9    Eosinophils Relative 12/14/2023 3.9    Basophils Relative 12/14/2023 0.8    Neutro Abs 12/14/2023 4.2    Lymphs Abs 12/14/2023 1.0    Monocytes Absolute 12/14/2023 0.8    Eosinophils Absolute 12/14/2023 0.2    Basophils Absolute 12/14/2023 0.0    Sodium 12/14/2023 143    Potassium 12/14/2023 3.5    Chloride 12/14/2023 107    CO2 12/14/2023 26    Glucose, Bld 12/14/2023 110 (H)    BUN 12/14/2023 45 (H)    Creatinine, Ser 12/14/2023 5.98 (HH)    Total Bilirubin 12/14/2023 0.9    Alkaline Phosphatase 12/14/2023 171 (H)    AST 12/14/2023 37    ALT 12/14/2023 39    Total Protein 12/14/2023 6.6    Albumin  12/14/2023 3.3 (L)    GFR 12/14/2023 10.23 (LL)    Calcium  12/14/2023 9.3    Color, Urine 12/14/2023 YELLOW    APPearance 12/14/2023 CLEAR    Specific Gravity, Urine 12/14/2023 1.020    pH 12/14/2023 6.5    Total Protein, Urine 12/14/2023 >=300 (A)    Urine Glucose 12/14/2023 NEGATIVE    Ketones, ur 12/14/2023 NEGATIVE    Bilirubin Urine 12/14/2023 NEGATIVE    Hgb urine dipstick 12/14/2023 SMALL (A)    Urobilinogen, UA 12/14/2023 0.2    Leukocytes,Ua 12/14/2023 NEGATIVE    Nitrite 12/14/2023 NEGATIVE    WBC, UA 12/14/2023 0-2/hpf    RBC / HPF 12/14/2023 3-6/hpf (A)    Squamous Epithelial / HPF 12/14/2023 Rare(0-4/hpf)   Office Visit on 12/01/2023  Component Date Value   WBC 12/01/2023 5.8    RBC 12/01/2023 3.63 (L)    Hemoglobin 12/01/2023 12.6 (L)    HCT 12/01/2023 36.1 (L)    MCV 12/01/2023 99.6    MCHC 12/01/2023 34.8    RDW 12/01/2023 14.9    Platelets 12/01/2023 67.0 (L)    Neutrophils Relative % 12/01/2023 56.2    Lymphocytes Relative 12/01/2023 23.7    Monocytes Relative 12/01/2023 13.0 (H)    Eosinophils Relative 12/01/2023 6.2 (H)    Basophils Relative 12/01/2023  0.9    Neutro Abs 12/01/2023 3.2    Lymphs Abs 12/01/2023 1.4    Monocytes Absolute 12/01/2023 0.8    Eosinophils Absolute 12/01/2023 0.4    Basophils Absolute 12/01/2023 0.0    Sodium 12/01/2023 140    Potassium 12/01/2023 3.5    Chloride 12/01/2023 104    CO2 12/01/2023 25    Glucose, Bld 12/01/2023 96    BUN 12/01/2023 50 (H)    Creatinine, Ser 12/01/2023 5.78 (HH)    Total Bilirubin 12/01/2023 0.8    Alkaline Phosphatase 12/01/2023 178 (H)    AST 12/01/2023 34    ALT 12/01/2023 44    Total Protein 12/01/2023 6.7    Albumin  12/01/2023 3.5    GFR 12/01/2023 10.66 (LL)    Calcium  12/01/2023 9.3    TSH W/REFLEX TO FT4 12/01/2023 3.83    Color, Urine 12/01/2023 YELLOW    APPearance 12/01/2023 CLEAR    Specific Gravity, Urine 12/01/2023 1.015  pH 12/01/2023 6.0    Total Protein, Urine 12/01/2023 100 (A)    Urine Glucose 12/01/2023 250 (A)    Ketones, ur 12/01/2023 NEGATIVE    Bilirubin Urine 12/01/2023 NEGATIVE    Hgb urine dipstick 12/01/2023 SMALL (A)    Urobilinogen, UA 12/01/2023 0.2    Leukocytes,Ua 12/01/2023 NEGATIVE    Nitrite 12/01/2023 NEGATIVE    WBC, UA 12/01/2023 0-2/hpf    RBC / HPF 12/01/2023 0-2/hpf    Squamous Epithelial / HPF 12/01/2023 Rare(0-4/hpf)    POC Glucose 12/01/2023 116 (A)   Office Visit on 11/09/2023  Component Date Value   WBC 11/09/2023 4.8    RBC 11/09/2023 3.42 (L)    Hemoglobin 11/09/2023 11.7 (L)    HCT 11/09/2023 34.3 (L)    MCV 11/09/2023 100.1 (H)    MCHC 11/09/2023 34.2    RDW 11/09/2023 15.3    Platelets 11/09/2023 66.0 (L)    Neutrophils Relative % 11/09/2023 56.8    Lymphocytes Relative 11/09/2023 25.4    Monocytes Relative 11/09/2023 11.2    Eosinophils Relative 11/09/2023 5.5 (H)    Basophils Relative 11/09/2023 1.1    Neutro Abs 11/09/2023 2.7    Lymphs Abs 11/09/2023 1.2    Monocytes Absolute 11/09/2023 0.5    Eosinophils Absolute 11/09/2023 0.3    Basophils Absolute 11/09/2023 0.1    Sodium 11/09/2023 141     Potassium 11/09/2023 3.9    Chloride 11/09/2023 107    CO2 11/09/2023 23    Glucose, Bld 11/09/2023 159 (H)    BUN 11/09/2023 66 (H)    Creatinine, Ser 11/09/2023 5.04 (HH)    Total Bilirubin 11/09/2023 0.7    Alkaline Phosphatase 11/09/2023 199 (H)    AST 11/09/2023 35    ALT 11/09/2023 45    Total Protein 11/09/2023 6.8    Albumin  11/09/2023 3.3 (L)    GFR 11/09/2023 12.57 (LL)    Calcium  11/09/2023 8.9    Creatinine, Urine 11/09/2023 42    Protein/Creat Ratio 11/09/2023 2,762 (H)    Protein/Creatinine Ratio 11/09/2023 2.762 (H)    Total Protein, Urine 11/09/2023 116 (H)    Color, Urine 11/09/2023 YELLOW    APPearance 11/09/2023 CLEAR    Specific Gravity, Urine 11/09/2023 1.015    pH 11/09/2023 6.0    Total Protein, Urine 11/09/2023 100 (A)    Urine Glucose 11/09/2023 >=1000 (A)    Ketones, ur 11/09/2023 NEGATIVE    Bilirubin Urine 11/09/2023 NEGATIVE    Hgb urine dipstick 11/09/2023 SMALL (A)    Urobilinogen, UA 11/09/2023 0.2    Leukocytes,Ua 11/09/2023 NEGATIVE    Nitrite 11/09/2023 NEGATIVE    WBC, UA 11/09/2023 0-2/hpf    RBC / HPF 11/09/2023 0-2/hpf    Mucus, UA 11/09/2023 Presence of (A)    Squamous Epithelial / HPF 11/09/2023 Rare(0-4/hpf)    Amorphous 11/09/2023 Present (A)   Admission on 11/02/2023, Discharged on 11/04/2023  Component Date Value   Sodium 11/02/2023 140    Potassium 11/02/2023 3.9    Chloride 11/02/2023 106    CO2 11/02/2023 25    Glucose, Bld 11/02/2023 170 (H)    BUN 11/02/2023 70 (H)    Creatinine, Ser 11/02/2023 5.63 (H)    Calcium  11/02/2023 9.1    Total Protein 11/02/2023 7.1    Albumin  11/02/2023 2.8 (L)    AST 11/02/2023 57 (H)    ALT 11/02/2023 71 (H)    Alkaline Phosphatase 11/02/2023 188 (H)    Total Bilirubin 11/02/2023 0.7    GFR, Estimated  11/02/2023 12 (L)    Anion gap 11/02/2023 9    WBC 11/02/2023 5.3    RBC 11/02/2023 3.49 (L)    Hemoglobin 11/02/2023 11.3 (L)    HCT 11/02/2023 35.8 (L)    MCV 11/02/2023 102.6 (H)     MCH 11/02/2023 32.4    MCHC 11/02/2023 31.6    RDW 11/02/2023 14.6    Platelets 11/02/2023 54 (L)    nRBC 11/02/2023 0.0    ABO/RH(D) 11/02/2023 A POS    Antibody Screen 11/02/2023 NEG    Sample Expiration 11/02/2023                     Value:11/05/2023,2359 Performed at [facility], [address], [place], [place] [address]    Fecal Occult Bld 11/02/2023 NEGATIVE    Troponin I (High Sensiti* 11/02/2023 3    B Natriuretic Peptide 11/02/2023 25.4    Hemoglobin 11/02/2023 11.6 (L)    HCT 11/02/2023 35.9 (L)    Glucose-Capillary 11/02/2023 123 (H)    WBC 11/03/2023 5.1    RBC 11/03/2023 3.37 (L)    Hemoglobin 11/03/2023 11.3 (L)    HCT 11/03/2023 35.3 (L)    MCV 11/03/2023 104.7 (H)    MCH 11/03/2023 33.5    MCHC 11/03/2023 32.0    RDW 11/03/2023 14.8    Platelets 11/03/2023 56 (L)    nRBC 11/03/2023 0.0    Sodium 11/03/2023 143    Potassium 11/03/2023 3.7    Chloride 11/03/2023 111    CO2 11/03/2023 23    Glucose, Bld 11/03/2023 140 (H)    BUN 11/03/2023 65 (H)    Creatinine, Ser 11/03/2023 5.44 (H)    Calcium  11/03/2023 8.7 (L)    Total Protein 11/03/2023 6.3 (L)    Albumin  11/03/2023 2.6 (L)    AST 11/03/2023 53 (H)    ALT 11/03/2023 71 (H)    Alkaline Phosphatase 11/03/2023 185 (H)    Total Bilirubin 11/03/2023 0.6    GFR, Estimated 11/03/2023 12 (L)    Anion gap 11/03/2023 9    Magnesium  11/03/2023 2.3    Phosphorus 11/03/2023 4.9 (H)    Glucose-Capillary 11/03/2023 111 (H)    Glucose-Capillary 11/03/2023 98    Color, Urine 11/03/2023 YELLOW    APPearance 11/03/2023 CLEAR    Specific Gravity, Urine 11/03/2023 1.011    pH 11/03/2023 6.0    Glucose, UA 11/03/2023 >=500 (A)    Hgb urine dipstick 11/03/2023 NEGATIVE    Bilirubin Urine 11/03/2023 NEGATIVE    Ketones, ur 11/03/2023 NEGATIVE    Protein, ur 11/03/2023 100 (A)    Nitrite 11/03/2023 NEGATIVE    Leukocytes,Ua 11/03/2023 NEGATIVE    RBC / HPF 11/03/2023 0-5    WBC, UA 11/03/2023 0-5     Bacteria, UA 11/03/2023 NONE SEEN    Squamous Epithelial / HPF 11/03/2023 0-5    Glucose-Capillary 11/03/2023 105 (H)    WBC 11/04/2023 4.9    RBC 11/04/2023 3.15 (L)    Hemoglobin 11/04/2023 10.2 (L)    HCT 11/04/2023 31.6 (L)    MCV 11/04/2023 100.3 (H)    MCH 11/04/2023 32.4    MCHC 11/04/2023 32.3    RDW 11/04/2023 14.8    Platelets 11/04/2023 54 (L)    nRBC 11/04/2023 0.0    Magnesium  11/04/2023 2.2    Sodium 11/04/2023 141    Potassium 11/04/2023 4.0    Chloride 11/04/2023 111    CO2 11/04/2023 22    Glucose, Bld 11/04/2023 132 (H)  BUN 11/04/2023 62 (H)    Creatinine, Ser 11/04/2023 5.25 (H)    Calcium  11/04/2023 8.3 (L)    Phosphorus 11/04/2023 4.8 (H)    Albumin  11/04/2023 2.5 (L)    GFR, Estimated 11/04/2023 13 (L)    Anion gap 11/04/2023 8    Glucose-Capillary 11/03/2023 167 (H)    Glucose-Capillary 11/04/2023 153 (H)   There may be more visits with results that are not included.  No image results found. VAS US  DUPLEX DIALYSIS ACCESS (AVF, AVG) Result Date: 04/18/2024 DIALYSIS ACCESS Patient Name:  Sean [name] [facility]  Date of Exam:   04/18/2024 Medical Rec #: [id number]            Accession #:    [id number] Date of Birth: 28-Mar-1971            Patient Gender: M Patient Age:   77 years Exam Location:  [address] Procedure:      VAS US  DUPLEX DIALYSIS ACCESS (AVF, AVG) Referring Phys: JOSHUA ROBINS --------------------------------------------------------------------------------  Reason for Exam: Routine follow up. Access Site: Left Upper Extremity. Access Type: Radial-cephalic AVF. Comparison Study: No prior study Performing Technologist: Delford Felling MHA, RDMS, RVT, RDCS  Examination Guidelines: A complete evaluation includes B-mode imaging, spectral Doppler, color Doppler, and power Doppler as needed of all accessible portions of each vessel. Unilateral testing is considered an integral part of a complete examination. Limited examinations for reoccurring  indications may be performed as noted.  Findings: +--------------------+----------+-----------------+--------+ AVF                 PSV (cm/s)Flow Vol (mL/min)Comments +--------------------+----------+-----------------+--------+ Native artery inflow   187           874                +--------------------+----------+-----------------+--------+ AVF Anastomosis        549                              +--------------------+----------+-----------------+--------+  +------------+---------+------------+----------+-------------------------------+ OUTFLOW VEIN   PSV     Diameter  Depth (cm)           Describe                          (cm/s)      (cm)                                              +------------+---------+------------+----------+-------------------------------+ Prox UA        86        0.69       0.83     competing branch measuring                                                           0.22cm              +------------+---------+------------+----------+-------------------------------+ Mid UA         129       0.67       0.40     competing branch measuring  0.18cm              +------------+---------+------------+----------+-------------------------------+ Dist UA        54        0.84       0.48                                   +------------+---------+------------+----------+-------------------------------+ AC Fossa       82        0.87       0.37     competing branch measuring                                                           0.57cm              +------------+---------+------------+----------+-------------------------------+ Prox Forearm   148       0.85       0.58     competing branch measuring                                                            0.2cm              +------------+---------+------------+----------+-------------------------------+ Mid Forearm     112       0.91       0.46                                   +------------+---------+------------+----------+-------------------------------+ Dist Forearm   206       0.91       0.21                                   +------------+---------+------------+----------+-------------------------------+   Summary: Patent arteriovenous fistula. Volume flow= 874 mL/min. Arteriovenous fistula-Velocities less than 100cm/s noted. Multiple competing branches as noted above; largest measuring 0.57cm at [facility] fossa. *See table(s) above for measurements and observations.  Diagnosing physician: Genny Kid MD Electronically signed by Genny Kid MD on 04/18/2024 at 3:39:42 PM.   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 ESRD (end stage renal disease) on dialysis [facility]) He experiences cramps post-dialysis likely due to electrolyte imbalances and rapid fluid removal. Dialysis time is extended to 4 hours and 15 minutes to mitigate cramps. Nausea is suspected to be due to high blood urea nitrogen levels rather than Mounjaro .  Blood pressure medication is adjusted from fast-releasing to slow-releasing metoprolol . There are concerns about the fluid removal rate and the potential need for additional dialysis if vomiting occurs. Continue dialysis with the extended time. Discuss with the dialysis team about adjusting the fluid removal rate to prevent cramps.  Monitor for nausea and consider additional dialysis if vomiting occurs. Ensure follow-up with the nephrologist if cramps persist. Switch to long-acting metoprolol  as recommended. Discuss with the dialysis team about checking extended electrolytes if cramps persist. Chronic liver failure without hepatic coma (HCC) Advanced fatty liver disease affects liver function and causes blood cell destruction. Weight loss is recommended to improve liver function. Mounjaro  is considered to aid in weight loss. Insurance and cost issues with Mounjaro  are discussed, with potential pharmacy options and savings cards considered. Prescribe Mounjaro  for weight loss, starting at 2.5 mg if possible. [facility] obtain [facility]; if not possible, try [facility]. Consider online savings cards to reduce the cost of Mounjaro . Monitor liver function and weight loss progress. NASH (nonalcoholic steatohepatitis) Advanced fatty liver disease affects liver function and causes blood cell destruction. Weight loss is recommended to improve liver function. Mounjaro  is considered to aid in weight loss. Insurance and cost issues with Mounjaro  are discussed, with potential pharmacy options and savings cards considered. Prescribe Mounjaro  for weight loss, starting at 2.5 mg if possible. Attempt to obtain [facility]  [facility]; if not possible, try [facility]. Consider online savings cards to reduce the cost of Mounjaro . Monitor liver function and weight loss progress. Obstructive sleep apnea syndrome Part of why we want Mounjaro /Zepbound  Abnormal bowel habits He gets occasional discomfort lower abdomen and irregular bowel movements. Updated problem overview for this problem to improve longitudinal management. Encouraged patient to add more fiber. Muscle cramps Cramps are likely due to electrolyte imbalances from rapid fluid removal during dialysis. Potassium and calcium  levels are of concern, with  trace minerals like zinc potentially involved. Consider dietary adjustments and extended electrolyte checks if cramps persist. Adjust dialysis fluid removal rate to prevent cramps. Intertrigo Intertrigo is nearly healed with current treatment. Mild discomfort is reported. Continue the current treatment regimen and monitor for any signs of worsening or infection. Hypertension, unspecified type BP Readings from Last 3 Encounters:  04/22/24 122/68  04/22/24 (!) 108/58  04/18/24 (!) 100/51  Well-controlled Was running borderline low so I wanted to monitor closely. continues on, as of 04/23/2024 Current hypertension medications:       Sig   losartan  (COZAAR ) 50 MG tablet (Taking) Take 50 mg by mouth daily.   metoprolol  succinate (TOPROL -XL) 25 MG 24 hr tablet (Taking) Take 1 tablet (25 mg total) by mouth daily.        Follow up 3 wk for disability assistance, possible GLP-1 agonist insurance prior authorization, continue close blood pressure monitoring      Orders Placed During this Encounter:   Meds ordered this encounter  Medications   tirzepatide  (MOUNJARO ) 2.5 MG/0.5ML Pen    Sig: Inject 2.5 mg into the skin once a week.    Dispense:  2 mL    Refill:  3    Restarting after holding for onset of dialysis.   metoprolol  succinate (TOPROL -XL) 25 MG 24 hr tablet    Sig: Take 1 tablet (25 mg total) by mouth daily.    Dispense:  90 tablet    Refill:  3        This document was synthesized by artificial intelligence ([employer]) using HIPAA-compliant recording of  the clinical interaction;   We discussed the use of AI scribe software for clinical note transcription with the patient, who gave verbal consent to proceed. additional Info: This encounter employed state-of-the-art, real-time, collaborative documentation. The patient actively reviewed and assisted in updating their electronic medical record on a shared screen, ensuring transparency and facilitating joint problem-solving for the  problem list, overview, and plan. This approach promotes accurate, informed care. The treatment plan was discussed and reviewed in detail, including medication safety, potential side effects, and all patient questions. We confirmed understanding and comfort with the plan. Follow-up instructions were established, including contacting the office for any concerns, returning if symptoms worsen, persist, or new symptoms develop, and precautions for potential emergency department visits.

## 2024-04-22 NOTE — Patient Instructions (Addendum)
 OTC Miralax  po daily as needed for constipation No strianing or pushing during bowel movements Recommend following up with Drazek, Juanita Norlander, NP  at [facility] as scheduled.  _______________________________________________________  If your blood pressure at your visit was 140/90 or greater, please contact your primary care physician to follow up on this.  _______________________________________________________  If you are age 25 or older, your body mass index should be between 23-30. Your Body mass index is 51.74 kg/m. If this is out of the aforementioned range listed, please consider follow up with your Primary Care Provider.  If you are age 51 or younger, your body mass index should be between 19-25. Your Body mass index is 51.74 kg/m. If this is out of the aformentioned range listed, please consider follow up with your Primary Care Provider.   ________________________________________________________  The [facility] providers would like to encourage you to use MYCHART to communicate with providers for non-urgent requests or questions.  Due to long hold times on the telephone, sending your provider a message by [facility] may be a faster and more efficient way to get a response.  Please allow 48 business hours for a response.  Please remember that this is for non-urgent requests.  _______________________________________________________  Thank you for trusting me with your gastrointestinal care. Deanna May, RNP

## 2024-04-22 NOTE — Telephone Encounter (Signed)
 Attempted to return patient's call re: surgery scheduling.  No answer / No VM

## 2024-04-23 DIAGNOSIS — R198 Other specified symptoms and signs involving the digestive system and abdomen: Secondary | ICD-10-CM | POA: Insufficient documentation

## 2024-04-23 DIAGNOSIS — Z992 Dependence on renal dialysis: Secondary | ICD-10-CM | POA: Diagnosis not present

## 2024-04-23 DIAGNOSIS — N186 End stage renal disease: Secondary | ICD-10-CM | POA: Diagnosis not present

## 2024-04-23 DIAGNOSIS — N2581 Secondary hyperparathyroidism of renal origin: Secondary | ICD-10-CM | POA: Diagnosis not present

## 2024-04-23 MED ORDER — METOPROLOL SUCCINATE ER 25 MG PO TB24: 25.0000 mg | ORAL_TABLET | Freq: Every day | ORAL | 3 refills | Status: DC

## 2024-04-23 NOTE — Assessment & Plan Note (Signed)
 Part of why we want Mounjaro /Zepbound 

## 2024-04-23 NOTE — Patient Instructions (Signed)
 VISIT SUMMARY:  Sean [name], you came in today for a follow-up regarding your dialysis management and medication adjustments. We discussed your experiences with cramping during and after dialysis, nausea, changes in bowel habits, and your history of fatty liver disease and intertrigo.  YOUR PLAN:  -END-STAGE RENAL DISEASE ON DIALYSIS: End-stage renal disease means your kidneys are no longer able to work as they should to meet your body's needs. We have extended your dialysis time to 4 hours and 15 minutes to help reduce cramping. Please discuss with your dialysis team about adjusting the fluid removal rate to prevent cramps. Continue monitoring for nausea and consider additional dialysis if vomiting occurs. Ensure follow-up with your nephrologist if cramps persist. We have switched you to long-acting metoprolol  for blood pressure management.  -ELECTROLYTE IMBALANCE CAUSING CRAMPS: Electrolyte imbalance means that the levels of minerals in your blood, like potassium and calcium , are not in the right balance. This can cause cramps, especially with rapid fluid removal during dialysis. We recommend dietary adjustments and extended electrolyte checks if cramps persist. Please discuss with your dialysis team about adjusting the fluid removal rate to prevent cramps.  -FATTY LIVER DISEASE: Fatty liver disease means there is excess fat in your liver, which can affect its function. Weight loss is recommended to improve liver function. We have prescribed Mounjaro  to aid in weight loss, starting at 2.5 mg if possible. We will attempt to obtain [facility]; if not possible, we will try [facility]. Consider using online savings cards to reduce the cost of Mounjaro . We will monitor your liver function and weight loss progress.  -INTERTRIGO: Intertrigo is a rash that occurs in skin folds. Your intertrigo is nearly healed with the current treatment. Please continue the current treatment regimen and  monitor for any signs of worsening or infection.  INSTRUCTIONS:  Please follow up with your nephrologist if cramps persist. Discuss with your dialysis team about adjusting the fluid removal rate and checking extended electrolytes if cramps continue. Attempt to obtain M[facility]o  [facility]; if not possible, try [facility]. Consider using online savings cards to reduce the cost of Mounjaro . Continue monitoring for nausea and consider additional dialysis if vomiting occurs.  It was a pleasure seeing you today! Your health and satisfaction are our top priorities.  Scherrie Curt, MD  Your Providers PCP: Anthon Kins, MD,  [phone]) Referring Provider: Anthon Kins, MD,  [phone]) Care Team Provider: Jann Melody, MD,  [phone]) Care Team Provider: Karren Paddock, RN Care Team Provider: Deloria Fetch, MD,  [phone]) Care Team Provider: Lindle Rhea, MD Care Team Provider: Levada Raymond Care Team Provider: Melodie Spry, MD,  [phone])     NEXT STEPS: [x]  Early Intervention: Schedule sooner appointment, call our on-call services, or go to emergency room if there is any significant Increase in pain or discomfort New or worsening symptoms Sudden or severe changes in your health [x]  Flexible Follow-Up: We recommend a Return in about 3 weeks (around 05/13/2024) for review problems and medications. for optimal routine care. This allows for progress monitoring and treatment adjustments. [x]  Preventive Care: Schedule your annual preventive care visit! It's typically covered by insurance and helps identify potential health issues early. [x]  Lab & X-ray Appointments: Incomplete tests scheduled today, or call to schedule. X-rays: [facility] (M-F, 8:30am-noon or 1pm-5pm). [x]  Medical Information Release: Sign a release form at front desk to obtain relevant medical information we don't have.  MAKING THE MOST  OF  OUR FOCUSED 20 MINUTE APPOINTMENTS: [x]   Clearly state your top concerns at the beginning of the visit to focus our discussion [x]   If you anticipate you will need more time, please inform the front desk during scheduling - we can book multiple appointments in the same week. [x]   If you have transportation problems- use our convenient video appointments or ask about transportation support. [x]   We can get down to business faster if you use MyChart to update information before the visit and submit non-urgent questions before your visit. Thank you for taking the time to provide details through MyChart.  Let our nurse know and she can import this information into your encounter documents.  Arrival and Wait Times: [x]   Arriving on time ensures that everyone receives prompt attention. [x]   Early morning (8a) and afternoon (1p) appointments tend to have shortest wait times. [x]   Unfortunately, we cannot delay appointments for late arrivals or hold slots during phone calls.  Getting Answers and Following Up [x]   Simple Questions & Concerns: For quick questions or basic follow-up after your visit, reach us  at [phone] or MyChart messaging. [x]   Complex Concerns: If your concern is more complex, scheduling an appointment might be best. Discuss this with the staff to find the most suitable option. [x]   Lab & Imaging Results: We'll contact you directly if results are abnormal or you don't use MyChart. Most normal results will be on MyChart within 2-3 business days, with a review message from Dr. Boston Byers. Haven't heard back in 2 weeks? Need results sooner? Contact us  at [phone]. [x]   Referrals: Our referral coordinator will manage specialist referrals. The specialist's office should contact you within 2 weeks to schedule an appointment. Call us  if you haven't heard from them after 2 weeks.  Staying Connected [x]   MyChart: Activate your MyChart for the fastest way to access results and message us .  See the last page of this paperwork for instructions on how to activate.  Bring to Your Next Appointment [x]   Medications: Please bring all your medication bottles to your next appointment to ensure we have an accurate record of your prescriptions. [x]   Health Diaries: If you're monitoring any health conditions at home, keeping a diary of your readings can be very helpful for discussions at your next appointment.  Billing [x]   X-ray & Lab Orders: These are billed by separate companies. Contact the invoicing company directly for questions or concerns. [x]   Visit Charges: Discuss any billing inquiries with our administrative services team.  Your Satisfaction Matters [x]   Share Your Experience: We strive for your satisfaction! If you have any complaints, or preferably compliments, please let Dr. Boston Byers know directly or contact our Practice Administrators, Olinda Bertrand or [employer], by asking at the front desk.   Reviewing Your Records [x]   Review this early draft of your clinical encounter notes below and the final encounter summary tomorrow on MyChart after its been completed.  All orders placed so far are visible here: Morbid obesity (HCC) -     Tirzepatide ; Inject 2.5 mg into the skin once a week.  Dispense: 2 mL; Refill: 3  ESRD (end stage renal disease) on dialysis (HCC)  Chronic liver failure without hepatic coma (HCC)  NASH (nonalcoholic steatohepatitis) -     Tirzepatide ; Inject 2.5 mg into the skin once a week.  Dispense: 2 mL; Refill: 3  Obstructive sleep apnea syndrome -     Tirzepatide ; Inject 2.5 mg into the skin once  a week.  Dispense: 2 mL; Refill: 3  Abnormal bowel habits  Muscle cramps  Intertrigo  Hypertension, unspecified type

## 2024-04-23 NOTE — Assessment & Plan Note (Signed)
 He gets occasional discomfort lower abdomen and irregular bowel movements. Updated problem overview for this problem to improve longitudinal management. Encouraged patient to add more fiber.

## 2024-04-23 NOTE — Assessment & Plan Note (Addendum)
 BP Readings from Last 3 Encounters:  04/22/24 122/68  04/22/24 (!) 108/58  04/18/24 (!) 100/51  Well-controlled Was running borderline low so I wanted to monitor closely. continues on, as of 04/23/2024 Current hypertension medications:       Sig   losartan  (COZAAR ) 50 MG tablet (Taking) Take 50 mg by mouth daily.   metoprolol  succinate (TOPROL -XL) 25 MG 24 hr tablet (Taking) Take 1 tablet (25 mg total) by mouth daily.

## 2024-04-23 NOTE — Assessment & Plan Note (Signed)
 Restart Mounjaro , will possibly need reapproved Was held due to nausea/peptic ulcer disease/progression of renal disease to dialysis but now approved to resume from nephrological standpoint.

## 2024-04-23 NOTE — Assessment & Plan Note (Signed)
 Advanced fatty liver disease affects liver function and causes blood cell destruction. Weight loss is recommended to improve liver function. Mounjaro  is considered to aid in weight loss. Insurance and cost issues with Mounjaro  are discussed, with potential pharmacy options and savings cards considered. Prescribe Mounjaro  for weight loss, starting at 2.5 mg if possible. Attempt to obtain [facility]; if not possible, try [facility]. Consider online savings cards to reduce the cost of Mounjaro . Monitor liver function and weight loss progress.

## 2024-04-26 ENCOUNTER — Other Ambulatory Visit: Payer: Self-pay

## 2024-04-26 DIAGNOSIS — Z4901 Encounter for fitting and adjustment of extracorporeal dialysis catheter: Secondary | ICD-10-CM | POA: Insufficient documentation

## 2024-04-26 DIAGNOSIS — Z992 Dependence on renal dialysis: Secondary | ICD-10-CM | POA: Diagnosis not present

## 2024-04-26 DIAGNOSIS — N186 End stage renal disease: Secondary | ICD-10-CM

## 2024-04-26 DIAGNOSIS — L299 Pruritus, unspecified: Secondary | ICD-10-CM | POA: Diagnosis not present

## 2024-04-26 DIAGNOSIS — N2581 Secondary hyperparathyroidism of renal origin: Secondary | ICD-10-CM | POA: Diagnosis not present

## 2024-04-26 DIAGNOSIS — R52 Pain, unspecified: Secondary | ICD-10-CM | POA: Diagnosis not present

## 2024-04-26 NOTE — Progress Notes (Signed)
 I agree with the assessment and plan as outlined by Ms. May. 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 Patient does not have any high risk factors for stomach cancer so I do not think that we need surveillance of his GIM for now. If Sean [name] determine that the patient needs variceal screening, then we can plan for EGD per her recommendations in the future.

## 2024-04-28 DIAGNOSIS — N186 End stage renal disease: Secondary | ICD-10-CM | POA: Diagnosis not present

## 2024-04-28 DIAGNOSIS — L299 Pruritus, unspecified: Secondary | ICD-10-CM | POA: Diagnosis not present

## 2024-04-28 DIAGNOSIS — N2581 Secondary hyperparathyroidism of renal origin: Secondary | ICD-10-CM | POA: Diagnosis not present

## 2024-04-28 DIAGNOSIS — Z992 Dependence on renal dialysis: Secondary | ICD-10-CM | POA: Diagnosis not present

## 2024-04-28 DIAGNOSIS — R52 Pain, unspecified: Secondary | ICD-10-CM | POA: Diagnosis not present

## 2024-04-30 DIAGNOSIS — N186 End stage renal disease: Secondary | ICD-10-CM | POA: Diagnosis not present

## 2024-04-30 DIAGNOSIS — N2581 Secondary hyperparathyroidism of renal origin: Secondary | ICD-10-CM | POA: Diagnosis not present

## 2024-04-30 DIAGNOSIS — R52 Pain, unspecified: Secondary | ICD-10-CM | POA: Diagnosis not present

## 2024-04-30 DIAGNOSIS — I509 Heart failure, unspecified: Secondary | ICD-10-CM | POA: Diagnosis not present

## 2024-04-30 DIAGNOSIS — L299 Pruritus, unspecified: Secondary | ICD-10-CM | POA: Diagnosis not present

## 2024-04-30 DIAGNOSIS — Z992 Dependence on renal dialysis: Secondary | ICD-10-CM | POA: Diagnosis not present

## 2024-05-02 ENCOUNTER — Telehealth: Payer: Self-pay

## 2024-05-02 ENCOUNTER — Telehealth: Payer: Self-pay | Admitting: Internal Medicine

## 2024-05-02 NOTE — Telephone Encounter (Signed)
 Copied from CRM [id number]. Topic: General - Other >> May 02, 2024 12:24 PM Clyde Darling P wrote: Reason for CRM: Pt advise disability people has not receive the forms from visit 03/14. Pt would like to have it resent.  Tried to call pt back to inform him we do not have the forms could he get the place to resend his disability forms to our office at [phone]. There was no answer to leave voicemail.

## 2024-05-02 NOTE — Telephone Encounter (Unsigned)
 Copied from CRM [id number]. Topic: Clinical - Prescription Issue >> May 02, 2024 12:19 PM Clyde Darling P wrote: Reason for CRM: [facility]at[facility]  (MOUNJARO ) 2.5 MG/0.5ML Pen & .metoprolol  succinate (TOPROL -XL) 25 MG 24 hr tablet [id number] [place], [place] - [address]

## 2024-05-03 DIAGNOSIS — Z992 Dependence on renal dialysis: Secondary | ICD-10-CM | POA: Diagnosis not present

## 2024-05-03 DIAGNOSIS — L299 Pruritus, unspecified: Secondary | ICD-10-CM | POA: Diagnosis not present

## 2024-05-03 DIAGNOSIS — N186 End stage renal disease: Secondary | ICD-10-CM | POA: Diagnosis not present

## 2024-05-03 DIAGNOSIS — N2581 Secondary hyperparathyroidism of renal origin: Secondary | ICD-10-CM | POA: Diagnosis not present

## 2024-05-03 DIAGNOSIS — R52 Pain, unspecified: Secondary | ICD-10-CM | POA: Diagnosis not present

## 2024-05-04 ENCOUNTER — Encounter (HOSPITAL_COMMUNITY): Payer: Self-pay | Admitting: Vascular Surgery

## 2024-05-04 ENCOUNTER — Other Ambulatory Visit: Payer: Self-pay

## 2024-05-04 NOTE — Progress Notes (Signed)
 SDW CALL  Patient was given pre-op  instructions over the phone. The opportunity was given for the patient to ask questions. No further questions asked. Patient verbalized understanding of instructions given.   PCP - Scherrie Curt Cardiologist - Dr. Gloriann Larger  PPM/ICD - denies   Chest x-ray - 01/11/24 EKG - 01/14/24 Stress Test - 04/17/23 ECHO - 04/16/23 Cardiac Cath - 04/15/17  Sleep Study - OSA+ and wears CPAP  Patient states that he is pre-diabetic and checks his blood sugar approximately 4 times a week but does not know his fast glucose.    Last dose of GLP1 agonist-  has not started GLP1 instructions:  n/a  Blood Thinner Instructions:  Eliquis  - last dose was evening of 6/2 Aspirin  Instructions: n/a  ERAS Protcol -  NPO   COVID TEST- n/a   Anesthesia review: yes - cardiac history  Patient denies shortness of breath, fever, cough and chest pain over the phone call   All instructions explained to the patient, with a verbal understanding of the material. Patient agrees to go over the instructions while at home for a better understanding.

## 2024-05-05 DIAGNOSIS — R52 Pain, unspecified: Secondary | ICD-10-CM | POA: Diagnosis not present

## 2024-05-05 DIAGNOSIS — Z992 Dependence on renal dialysis: Secondary | ICD-10-CM | POA: Diagnosis not present

## 2024-05-05 DIAGNOSIS — N2581 Secondary hyperparathyroidism of renal origin: Secondary | ICD-10-CM | POA: Diagnosis not present

## 2024-05-05 DIAGNOSIS — N186 End stage renal disease: Secondary | ICD-10-CM | POA: Diagnosis not present

## 2024-05-05 DIAGNOSIS — L299 Pruritus, unspecified: Secondary | ICD-10-CM | POA: Diagnosis not present

## 2024-05-05 NOTE — Anesthesia Preprocedure Evaluation (Signed)
 Anesthesia Evaluation  Patient identified by MRN, date of birth, ID band Patient awake    Reviewed: Allergy & Precautions, NPO status , Patient's Chart, lab work & pertinent test results  History of Anesthesia Complications (+) PONV and history of anesthetic complications  Airway Mallampati: III  TM Distance: >3 FB Neck ROM: Full    Dental no notable dental hx.    Pulmonary asthma , sleep apnea and Continuous Positive Airway Pressure Ventilation    Pulmonary exam normal        Cardiovascular hypertension, Pt. on medications and Pt. on home beta blockers + CAD and + CABG  Normal cardiovascular exam+ dysrhythmias Atrial Fibrillation      Neuro/Psych negative neurological ROS  negative psych ROS   GI/Hepatic ,GERD  Medicated and Controlled,,(+) Cirrhosis         Endo/Other    Class 4 obesity  Renal/GU ESRFRenal disease     Musculoskeletal  (+) Arthritis ,    Abdominal  (+) + obese  Peds  Hematology  (+) Blood dyscrasia (Eliquis ), anemia Thrombocytopenia    Anesthesia Other Findings   Reproductive/Obstetrics                             Anesthesia Physical Anesthesia Plan  ASA: 4  Anesthesia Plan: General   Post-op Pain Management:    Induction: Intravenous  PONV Risk Score and Plan: 3 and Ondansetron , Dexamethasone , Midazolam  and Treatment may vary due to age or medical condition  Airway Management Planned: Oral ETT and Video Laryngoscope Planned  Additional Equipment:   Intra-op Plan:   Post-operative Plan: Extubation in OR  Informed Consent: I have reviewed the patients History and Physical, chart, labs and discussed the procedure including the risks, benefits and alternatives for the proposed anesthesia with the patient or authorized representative who has indicated his/her understanding and acceptance.       Plan Discussed with: CRNA  Anesthesia Plan Comments: (PAT  note written 05/05/2024 by Lavaughn Haberle, PA-C.  )       Anesthesia Quick Evaluation

## 2024-05-05 NOTE — Progress Notes (Signed)
 Anesthesia Chart Review: SAME DAY WORK-UP  Case: [id number] Date/Time: 05/06/24 0906   Procedures:      FISTULA SUPERFICIALIZATION (Left)     LIGATION OF COMPETING BRANCHES OF ARTERIOVENOUS FISTULA (Left)   Anesthesia type: Choice   Diagnosis: ESRD (end stage renal disease) (HCC) [N18.6]   Pre-op  diagnosis: esrd   [facility] / [facility]   Surgeons: Adine Hoof, MD       DISCUSSION: Patient is a 53 year old male scheduled for the above procedure. S/p right internal jugular TDC and creation of left radiocephalic AVF 01/11/24.   History includes never smoker, post-operative N/V, HTN, rightward heart rotation ("anterior rotation of the cardiac apex, no obvious dextrocardia or other anomalies such as situs inversus" per 12/25/15 Cardiac CT), CAD (s/p CABG: LIMA-LAD 12/26/15), ischemic cardiomyopathy, PAF/flutter (s/p DCCV 02/14/16), SVT (01/2024 insetting of uremia/progressive to ESRD), bradycardia, chronic combined CHF, pre-diabetes, ESRD (HD initiated 01/12/24, HD TTS), OSA (uses CPAP), exertional dyspnea, advanced liver fibrosis ("likely secondary to [facility] versus congestive hepatopathy"; no evidence for portal hypertensive gastropathy, varices 11/25/22 EGD), thrombocytopenia (likely multifactorial (liver disease, cardiac component, MGUS?, ITP?), morbid obesity,   [facility] - 01/16/24 for uremia with progression of CKD 5 to ESRD. S/p [facility] placement and AVF creation on 01/11/24 and HD initiated 01/12/24. On 01/14/24 had sustained tachycardia at rates up to 150s. EKG initially interpreted as STEMI. Cardiology diagnosed with SVT. He did not initially convert after oral/IV metoprolol  and vagal maneuvers, but prior to adenosine  being administered he spontaneously converted to SR while straining for BM. Troponins were negative x2. Metoprolol  recommended per cardiology. He was maintained on Eliquis  for afib/flutter history.  Last Eliquis  05/04/24.  Last cardiology follow-up was on 02/01/24  by Slater Duncan, NP. HR well controlled but still with daily episodes of palpitations, some up to 10 minutes. He is on Cardizem , Torpol, and Eliquis . Advised to take additional metoprolol  25 mg for sustained HR > 130 bpm or if associated symptoms like dizziness. No chest pain. On Repatha  for HLD. Volume controlled on HD. HTN well controlled since on dialysis. No ischemic testing recommended. Follow-up in 3-4 months planned.   Recent GI and HEM notes summarized below.    Anesthesia team to evaluate on the day of surgery.    VS:  Wt Readings from Last 3 Encounters:  04/22/24 (!) 168.3 kg  04/22/24 (!) 169.8 kg  04/18/24 (!) 170.9 kg   BP Readings from Last 3 Encounters:  04/22/24 122/68  04/22/24 (!) 108/58  04/18/24 (!) 100/51   Pulse Readings from Last 3 Encounters:  04/22/24 60  04/22/24 72  04/18/24 (!) 59     PROVIDERS: Anthon Kins, MD is PCP. Last visit 04/22/24. Anastacio Balm, MD is cardiologist - Garth Kansky, MD is GI. GI APP follow-up on 04/22/24. Repeat EGD for gastric mapping (history of gastric intestinal metaplasia) recommended in the near future-->referred to Dr. Lindle Rhea.  Duey Ghent, NP is hepatology provider ([facility]). Last visit 12/21/23 with six month follow-up planned. Ciro Cress, MD is HEM. Last visit 01/29/24.  Following abdominal US  showing fatty liver disease and secondary splenomegaly, he felt this was likely the reason behind his baseline thrombocytopenia  Bone marrow biopsy in 04/2023 was "non consistent with multiple myeloma". PLT count stable at ~ 70K. Six month follow-up planned.  Clevester Dally, MD is nephrologist   LABS: For day of surgery. Per [facility] labs, H/H 10.1/31.3 on 05/03/24, A1c 6.1% on 03/14/24. AST 55,  ALT 64, PLT count 54-89K since 10/2024 in [facility].    IMAGES: 1V PCXR 01/11/24: IMPRESSION: Catheter placed from a right internal jugular approach. Tip is either at the SVC RA junction or at the proximal right  atrium. No pneumothorax.  US  Abd RUQ 12/24/23: IMPRESSION: 1. Increased hepatic parenchymal echogenicity suggestive of steatosis. 2. Status post cholecystectomy.   CT Abd/pelvis 11/02/23: IMPRESSION: 1. No acute process demonstrated in the abdomen or pelvis. No significant change since previous study. 2. Chronic elevation of the left hemidiaphragm. 3. Mild aortic atherosclerosis. 4. Cystic structure adjacent to the umbilicus likely representing a sebaceous cyst. No change.   EKG: EKG 01/14/24: ** Critical Test Result: STEMI Sinus tachycardia at 159 bpm Posterior infarct , possibly acute Marked ST abnormality, possible inferior subendocardial injury ** ACUTE MI / STEMI ** Abnormal ECG When compared with ECG of 09-Jan-2024 02:57, HEART RATE has increased ST/T abnormalities are more pronounced Confirmed by End, Christopher [phone]) on 01/16/2024 12:27:16 PM - Evaluated by cardiology and EKG felt to represent SVT and not treated as STEMI. He converted to SR after oral and IV metoprolol  and vagal maneuvers. Troponins on 01/14/24 were negative x2.  EKG 01/09/24: Sinus bradycardia at 59 bpm Minimal voltage criteria for LVH, may be normal variant ( Sokolow-Lyon ) ST & T wave abnormality, consider anterolateral ischemia Abnormal ECG No significant change since last tracing Confirmed by Maudine Sos ([id number]) on 01/10/2024 7:51:36 PM   CV: Nuclear stress test 04/17/23:   Findings are consistent with no ischemia. The study is low risk.   No ST deviation was noted.   LV perfusion is normal.   Left ventricular function is normal. Nuclear stress EF: 57 %. The left ventricular ejection fraction is normal (55-65%). End diastolic cavity size is moderately enlarged. End systolic cavity size is moderately enlarged.   Prior study available for comparison from 10/04/2021. Abnormal perfusion. Apical to mid inferior fixed defect and apical to mid anterior wall defect, both thought to be artifact.  LVEF 47%.   Large size, moderate intensity fixed anterior and inferior perfusion defects without reversible ischemia, consistent with artifact. LVEF 57%, moderately dilated LV with normal wall motion. This is a low risk study. Compared to a prior study in 2022, the perfusion defects are unchanged and favored to be artifact. The LVEF has improved.     TTE 04/16/23: IMPRESSIONS   1. Left ventricular ejection fraction, by estimation, is 50 to 55%. The  left ventricle has low normal function. The left ventricle has no regional  wall motion abnormalities. Left ventricular diastolic parameters were  normal.   2. Right ventricular systolic function is normal. The right ventricular  size is mildly enlarged.   3. Left atrial size was mildly dilated.   4. The mitral valve is normal in structure. No evidence of mitral valve  regurgitation. No evidence of mitral stenosis.   5. The aortic valve is tricuspid. Aortic valve regurgitation is not  visualized. No aortic stenosis is present.   6. Aortic dilatation noted. There is mild dilatation of the ascending  aorta, measuring 42 mm.   7. The inferior vena cava is normal in size with greater than 50%  respiratory variability, suggesting right atrial pressure of 3 mmHg.  - Comparison LVEF 45-50% 03/31/16; 35-40% 02/14/16 TEE; 60-65% 12/24/15    Long term ZioXT monitor 12/15/21 - 12/22/21: Patient had a minimum heart rate of 36 bpm, maximum heart rate of 115 bpm, and average heart rate of 61 bpm. Predominant underlying rhythm  was sinus rhythm. Isolated PACs were rare (<1.0%). Isolated PVCs were rare (<1.0%). Four nocturnal sinus pauses occurred, the longest lasting 3.3 seconds. Triggered and diary events associated with sinus bradycardia.   No malignant arrhythmias.    LHC 11/02/20: 1.  Severe single-vessel coronary artery disease with total occlusion of the ostial LAD 2.  Widely patent left main, left circumflex, and RCA with no significant stenoses 3.   Patent LIMA to LAD with no significant stenosis present 4.  Normal LVEDP   Recommendations: Ongoing medical therapy.  The patient has stable coronary anatomy with continued patency of the LIMA graft.  Patient may resume apixaban  tomorrow morning at his normal dosing schedule.   US  Carotid 12/25/15: Summary:  - No significant extracranial carotid artery stenosis demonstrated.    Vertebrals are patent with antegrade flow.    Past Medical History:  Diagnosis Date   Abnormal liver enzymes    a. Sees a doctor in [place].   Acute renal failure superimposed on stage 5 chronic kidney disease, not on chronic dialysis (HCC) 01/08/2024   AKI (acute kidney injury) (HCC) 06/17/2023   Atrial fibrillation (HCC)    Cardiac cirrhosis    a. possible elevated LFTs/low platelets felt due to cardiac cirrhosis per 2017 admission.   Chronic combined systolic and diastolic CHF (congestive heart failure) (HCC)    Chronic kidney disease, stage 4 (severe) (HCC) 01/04/2016   Lab Results      Component    Value    Date/Time           GFR    29.24 (L)    09/30/2022 02:51 PM           GFR    27.27 (L)    09/24/2022 03:40 PM           GFR    24.10 (L)    09/03/2022 03:45 PM           GFR    18.98 (L)    08/25/2022 12:21 PM           GFR    38.98 (L)    04/24/2022 02:39 PM   Seeing [facility] [place] [facility] Nephrologist: Nan Aver, MD  Lastt plan 04/2023: # CKD stage IV    CKD (chronic kidney disease) stage 3, GFR 30-59 ml/min (HCC) 01/04/2016   Lab Results  Component  Value  Date/Time     GFR  29.24 (L)  09/30/2022 02:51 PM     GFR  27.27 (L)  09/24/2022 03:40 PM     GFR  24.10 (L)  09/03/2022 03:45 PM     GFR  18.98 (L)  08/25/2022 12:21 PM     GFR  38.98 (L)  04/24/2022 02:39 PM         CKD (chronic kidney disease), stage II    Stage 4   Congenital heart defect    a. rightward rotation of heart, almost dextrocardia   Coronary artery disease    a. s/p CABGx1 in 12/2015.   Dyspnea    with exertion   Gastric  polyps 11/25/2022   Gastritis and gastroduodenitis 11/25/2022          Gastroesophageal reflux disease with esophagitis and hemorrhage 04/04/2021   Hematuria    a. Chronic hx of this, no prior etiology determined through workup per patient.   History of umbilical hernia repair 07/10/2011   History of hernia surgery twice prior   Hypercholesteremia    a. Prev taken off statin  due to abnormal liver function.   Hypertension    Incisional hernia 07/10/2011   History of hernia surgery twice prior   Morbid obesity (HCC)    Morbid obesity with BMI of 50.0-59.9, adult (HCC) 01/04/2016   After [place] [facility] doctor is setting him up with a wellness clinic to help with weight losS  He reports not having tried anything for weight loss either diet or or medicine or surgery in the past  We failed to get Wegovy  10/2022      Wt Readings from Last 10 Encounters:  11/13/22  (!) 384 lb 12.8 oz (174.5 kg)  10/13/22  (!) 385 lb (174.6 kg)  09/30/22  (!) 384 lb (174.2 kg)  09/24/22  (!) 382 lb (1   Nausea and vomiting 11/25/2022   OSA on CPAP    Paroxysmal atrial flutter (HCC) 02/12/2016   PONV (postoperative nausea and vomiting)    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   LIMA to LAD   Sinus bradycardia    Thrombocytopenia (HCC)     Past Surgical History:  Procedure Laterality Date   APPENDECTOMY     AV FISTULA PLACEMENT Left 01/11/2024   Procedure: LEFT RADIOCEPHALIC ARTERIOVENOUS (AV) FISTULA CREATION;  Surgeon: Kayla Part, MD;  Location: [facility] OR;  Service: Vascular;  Laterality: Left;   BIOPSY  02/19/2021   Procedure: BIOPSY;  Surgeon: Lindle Rhea, MD;  Location: [facility] ENDOSCOPY;  Service: Gastroenterology;;   BIOPSY  11/25/2022   Procedure: BIOPSY;  Surgeon: Lindle Rhea, MD;  Location: [facility] ENDOSCOPY;  Service: Gastroenterology;;   CARDIAC CATHETERIZATION N/A 12/24/2015   Procedure: Right/Left Heart Cath and Coronary Angiography;  Surgeon: Mardell Shade, MD;  Location: [facility] INVASIVE CV LAB;   Service: Cardiovascular;  Laterality: N/A;   CARDIOVERSION N/A 02/14/2016   Procedure: CARDIOVERSION;  Surgeon: Hazle Lites, MD;  Location: [facility] ENDOSCOPY;  Service: Cardiovascular;  Laterality: N/A;   COLONOSCOPY WITH PROPOFOL  N/A 02/19/2021   Procedure: COLONOSCOPY WITH PROPOFOL ;  Surgeon: Lindle Rhea, MD;  Location: [facility] ENDOSCOPY;  Service: Gastroenterology;  Laterality: N/A;   CORONARY ARTERY BYPASS GRAFT N/A 12/26/2015   Procedure: Off Pump Coronary Artery Bypass Grafting times one using left internal mammary artery;  Surgeon: Gardenia Jump, MD;  Location: [facility] OR;  Service: Open Heart Surgery;  Laterality: N/A;   ESOPHAGOGASTRODUODENOSCOPY (EGD) WITH PROPOFOL  N/A 02/19/2021   Procedure: ESOPHAGOGASTRODUODENOSCOPY (EGD) WITH PROPOFOL ;  Surgeon: Lindle Rhea, MD;  Location: [facility] ENDOSCOPY;  Service: Gastroenterology;  Laterality: N/A;   ESOPHAGOGASTRODUODENOSCOPY (EGD) WITH PROPOFOL  N/A 11/25/2022   Procedure: ESOPHAGOGASTRODUODENOSCOPY (EGD) WITH PROPOFOL ;  Surgeon: Lindle Rhea, MD;  Location: [facility] ENDOSCOPY;  Service: Gastroenterology;  Laterality: N/A;   GALLBLADDER SURGERY     HERNIA REPAIR     INSERTION OF DIALYSIS CATHETER Right 01/11/2024   Procedure: INSERTION OF TUNNELED  DIALYSIS CATHETER RIGHT INTERNAL JUGULAR;  Surgeon: Kayla Part, MD;  Location: [facility] OR;  Service: Vascular;  Laterality: Right;   IR TRANSCATHETER BX  06/26/2023   IR US  GUIDE VASC ACCESS RIGHT  06/26/2023   IR VENOGRAM HEPATIC W HEMODYNAMIC EVALUATION  06/26/2023   LEFT HEART CATH AND CORS/GRAFTS ANGIOGRAPHY N/A 04/15/2017   Procedure: Left Heart Cath and Cors/Grafts Angiography;  Surgeon: Millicent Ally, MD;  Location: [facility] INVASIVE CV LAB;  Service: Cardiovascular;  Laterality: N/A;   LEFT HEART CATH AND CORS/GRAFTS ANGIOGRAPHY N/A 11/02/2020   Procedure: LEFT HEART CATH AND CORS/GRAFTS ANGIOGRAPHY;  Surgeon: Arnoldo Lapping, MD;  Location: [facility] INVASIVE CV LAB;  Service: Cardiovascular;  Laterality: N/A;    POLYPECTOMY  02/19/2021   Procedure: POLYPECTOMY;  Surgeon: Lindle Rhea, MD;  Location: [facility] ENDOSCOPY;  Service: Gastroenterology;;   TEE WITHOUT CARDIOVERSION N/A 12/26/2015   Procedure: TRANSESOPHAGEAL ECHOCARDIOGRAM (TEE);  Surgeon: Gardenia Jump, MD;  Location: [facility] OR;  Service: Open Heart Surgery;  Laterality: N/A;   TEE WITHOUT CARDIOVERSION N/A 02/14/2016   Procedure: TRANSESOPHAGEAL ECHOCARDIOGRAM (TEE);  Surgeon: Hazle Lites, MD;  Location: [facility] ENDOSCOPY;  Service: Cardiovascular;  Laterality: N/A;    MEDICATIONS: No current facility-administered medications for this encounter.    allopurinol  (ZYLOPRIM ) 100 MG tablet   amitriptyline  (ELAVIL ) 25 MG tablet   azelastine  (ASTELIN ) 0.1 % nasal spray   Cholecalciferol (VITAMIN D3) 125 MCG (5000 UT) CAPS   Cyanocobalamin  (VITAMIN B-12) 5000 MCG SUBL   diltiazem  (CARDIZEM  SR) 120 MG 12 hr capsule   ELIQUIS  5 MG TABS tablet   Evolocumab  (REPATHA ) 140 MG/ML SOSY   folic acid -vitamin b complex-vitamin c-selenium-zinc (DIALYVITE) 3 MG TABS tablet   lanthanum (FOSRENOL) 1000 MG chewable tablet   metoprolol  succinate (TOPROL -XL) 25 MG 24 hr tablet   montelukast  (SINGULAIR ) 10 MG tablet   nitroGLYCERIN  (NITROSTAT ) 0.4 MG SL tablet   pantoprazole  (PROTONIX ) 40 MG tablet   sucralfate  (CARAFATE ) 1 g tablet   iron sucrose in sodium chloride  0.9 % 100 mL   Methoxy PEG-Epoetin Beta (MIRCERA IJ)   nystatin  (MYCOSTATIN /NYSTOP ) powder   ondansetron  (ZOFRAN -ODT) 4 MG disintegrating tablet   tirzepatide  (MOUNJARO ) 2.5 MG/0.5ML Pen    technetium tetrofosmin  (TC-MYOVIEW ) injection 29.8 millicurie   Not currently taking Mounjaro  (prescribed for weight loss; awaiting insurance approval), Zofran , nystatin ,    Ella Gun, PA-C Surgical Short Stay/Anesthesiology [facility] Phone [phone] [facility] Phone [phone] 05/05/2024 11:23 AM

## 2024-05-06 ENCOUNTER — Encounter (HOSPITAL_COMMUNITY)
Admission: RE | Disposition: A | Discharge status: Home / Self Care | Payer: Self-pay | Source: Home / Self Care | Attending: Vascular Surgery

## 2024-05-06 ENCOUNTER — Ambulatory Visit (HOSPITAL_COMMUNITY): Payer: Self-pay | Admitting: Vascular Surgery

## 2024-05-06 ENCOUNTER — Ambulatory Visit (HOSPITAL_COMMUNITY)
Admission: RE | Admit: 2024-05-06 | Discharge: 2024-05-06 | Disposition: A | Discharge status: Home / Self Care | Attending: Vascular Surgery | Admitting: Vascular Surgery

## 2024-05-06 ENCOUNTER — Other Ambulatory Visit (HOSPITAL_COMMUNITY): Payer: Self-pay

## 2024-05-06 ENCOUNTER — Other Ambulatory Visit: Payer: Self-pay

## 2024-05-06 DIAGNOSIS — Z992 Dependence on renal dialysis: Secondary | ICD-10-CM

## 2024-05-06 DIAGNOSIS — Z6841 Body Mass Index (BMI) 40.0 and over, adult: Secondary | ICD-10-CM | POA: Insufficient documentation

## 2024-05-06 DIAGNOSIS — I5042 Chronic combined systolic (congestive) and diastolic (congestive) heart failure: Secondary | ICD-10-CM | POA: Insufficient documentation

## 2024-05-06 DIAGNOSIS — N186 End stage renal disease: Secondary | ICD-10-CM | POA: Diagnosis not present

## 2024-05-06 DIAGNOSIS — K219 Gastro-esophageal reflux disease without esophagitis: Secondary | ICD-10-CM | POA: Diagnosis not present

## 2024-05-06 DIAGNOSIS — T82898A Other specified complication of vascular prosthetic devices, implants and grafts, initial encounter: Secondary | ICD-10-CM

## 2024-05-06 DIAGNOSIS — E039 Hypothyroidism, unspecified: Secondary | ICD-10-CM | POA: Diagnosis not present

## 2024-05-06 DIAGNOSIS — I132 Hypertensive heart and chronic kidney disease with heart failure and with stage 5 chronic kidney disease, or end stage renal disease: Secondary | ICD-10-CM | POA: Insufficient documentation

## 2024-05-06 DIAGNOSIS — Z951 Presence of aortocoronary bypass graft: Secondary | ICD-10-CM | POA: Diagnosis not present

## 2024-05-06 DIAGNOSIS — I130 Hypertensive heart and chronic kidney disease with heart failure and stage 1 through stage 4 chronic kidney disease, or unspecified chronic kidney disease: Secondary | ICD-10-CM | POA: Diagnosis not present

## 2024-05-06 DIAGNOSIS — I2510 Atherosclerotic heart disease of native coronary artery without angina pectoris: Secondary | ICD-10-CM | POA: Insufficient documentation

## 2024-05-06 DIAGNOSIS — G4733 Obstructive sleep apnea (adult) (pediatric): Secondary | ICD-10-CM | POA: Diagnosis not present

## 2024-05-06 DIAGNOSIS — I4891 Unspecified atrial fibrillation: Secondary | ICD-10-CM | POA: Insufficient documentation

## 2024-05-06 DIAGNOSIS — Z7901 Long term (current) use of anticoagulants: Secondary | ICD-10-CM | POA: Insufficient documentation

## 2024-05-06 HISTORY — DX: Unspecified atrial fibrillation: I48.91

## 2024-05-06 HISTORY — DX: Nausea with vomiting, unspecified: R11.2

## 2024-05-06 HISTORY — DX: Dyspnea, unspecified: R06.00

## 2024-05-06 HISTORY — PX: LIGATION OF COMPETING BRANCHES OF ARTERIOVENOUS FISTULA: SHX5949

## 2024-05-06 HISTORY — DX: Other specified postprocedural states: Z98.890

## 2024-05-06 HISTORY — DX: Other specified postprocedural states: R11.2

## 2024-05-06 HISTORY — PX: FISTULA SUPERFICIALIZATION: SHX6341

## 2024-05-06 LAB — POCT I-STAT, CHEM 8
BUN: 38 mg/dL — ABNORMAL HIGH (ref 6–20)
Calcium, Ion: 1.02 mmol/L — ABNORMAL LOW (ref 1.15–1.40)
Chloride: 101 mmol/L (ref 98–111)
Creatinine, Ser: 7.5 mg/dL — ABNORMAL HIGH (ref 0.61–1.24)
Glucose, Bld: 166 mg/dL — ABNORMAL HIGH (ref 70–99)
HCT: 33 % — ABNORMAL LOW (ref 39.0–52.0)
Hemoglobin: 11.2 g/dL — ABNORMAL LOW (ref 13.0–17.0)
Potassium: 3.7 mmol/L (ref 3.5–5.1)
Sodium: 135 mmol/L (ref 135–145)
TCO2: 23 mmol/L (ref 22–32)

## 2024-05-06 SURGERY — FISTULA SUPERFICIALIZATION
Anesthesia: General | Laterality: Left

## 2024-05-06 MED ORDER — ONDANSETRON HCL 4 MG/2ML IJ SOLN
INTRAMUSCULAR | Status: DC | PRN
  Administered 2024-05-06: 4 mg via INTRAVENOUS

## 2024-05-06 MED ORDER — LIDOCAINE 2% (20 MG/ML) 5 ML SYRINGE
INTRAMUSCULAR | Status: AC
Start: 2024-05-06 — End: ?
  Filled 2024-05-06: qty 5

## 2024-05-06 MED ORDER — SODIUM CHLORIDE 0.9 % IV SOLN: 12.5000 mg | INTRAVENOUS | Status: DC | PRN

## 2024-05-06 MED ORDER — ROCURONIUM BROMIDE 10 MG/ML (PF) SYRINGE
PREFILLED_SYRINGE | INTRAVENOUS | Status: DC | PRN
  Administered 2024-05-06: 10 mg via INTRAVENOUS

## 2024-05-06 MED ORDER — EPHEDRINE 5 MG/ML INJ
INTRAVENOUS | Status: AC
  Filled 2024-05-06: qty 5

## 2024-05-06 MED ORDER — AMISULPRIDE (ANTIEMETIC) 5 MG/2ML IV SOLN: 10.0000 mg | Freq: Once | INTRAVENOUS | Status: DC | PRN

## 2024-05-06 MED ORDER — FENTANYL CITRATE (PF) 250 MCG/5ML IJ SOLN
INTRAMUSCULAR | Status: AC
  Filled 2024-05-06: qty 5

## 2024-05-06 MED ORDER — DEXAMETHASONE SODIUM PHOSPHATE 10 MG/ML IJ SOLN
INTRAMUSCULAR | Status: AC
Start: 2024-05-06 — End: ?
  Filled 2024-05-06: qty 1

## 2024-05-06 MED ORDER — EPHEDRINE SULFATE-NACL 50-0.9 MG/10ML-% IV SOSY
PREFILLED_SYRINGE | INTRAVENOUS | Status: DC | PRN
  Administered 2024-05-06: 10 mg via INTRAVENOUS
  Administered 2024-05-06: 15 mg via INTRAVENOUS
  Administered 2024-05-06 (×2): 12.5 mg via INTRAVENOUS

## 2024-05-06 MED ORDER — ROCURONIUM BROMIDE 10 MG/ML (PF) SYRINGE
PREFILLED_SYRINGE | INTRAVENOUS | Status: AC
  Filled 2024-05-06: qty 10

## 2024-05-06 MED ORDER — PROPOFOL 10 MG/ML IV BOLUS
INTRAVENOUS | Status: DC | PRN
  Administered 2024-05-06: 150 mg via INTRAVENOUS

## 2024-05-06 MED ORDER — LIDOCAINE 2% (20 MG/ML) 5 ML SYRINGE
INTRAMUSCULAR | Status: DC | PRN
  Administered 2024-05-06: 100 mg via INTRAVENOUS

## 2024-05-06 MED ORDER — OXYCODONE-ACETAMINOPHEN 5-325 MG PO TABS
1.0000 | ORAL_TABLET | Freq: Four times a day (QID) | ORAL | 0 refills | Status: AC | PRN
  Filled 2024-05-06: qty 8, 2d supply, fill #0

## 2024-05-06 MED ORDER — OXYCODONE HCL 5 MG PO TABS
5.0000 mg | ORAL_TABLET | Freq: Once | ORAL | Status: AC | PRN
  Administered 2024-05-06: 5 mg via ORAL

## 2024-05-06 MED ORDER — CHLORHEXIDINE GLUCONATE 0.12 % MT SOLN: 15.0000 mL | Freq: Once | OROMUCOSAL | Status: AC

## 2024-05-06 MED ORDER — PHENYLEPHRINE 80 MCG/ML (10ML) SYRINGE FOR IV PUSH (FOR BLOOD PRESSURE SUPPORT)
PREFILLED_SYRINGE | INTRAVENOUS | Status: AC
  Filled 2024-05-06: qty 10

## 2024-05-06 MED ORDER — FENTANYL CITRATE (PF) 250 MCG/5ML IJ SOLN
INTRAMUSCULAR | Status: DC | PRN
  Administered 2024-05-06: 100 ug via INTRAVENOUS
  Administered 2024-05-06 (×2): 50 ug via INTRAVENOUS

## 2024-05-06 MED ORDER — PHENYLEPHRINE 80 MCG/ML (10ML) SYRINGE FOR IV PUSH (FOR BLOOD PRESSURE SUPPORT)
PREFILLED_SYRINGE | INTRAVENOUS | Status: DC | PRN
  Administered 2024-05-06 (×4): 160 ug via INTRAVENOUS

## 2024-05-06 MED ORDER — 0.9 % SODIUM CHLORIDE (POUR BTL) OPTIME
TOPICAL | Status: DC | PRN
  Administered 2024-05-06: 1000 mL

## 2024-05-06 MED ORDER — OXYCODONE HCL 5 MG/5ML PO SOLN: 5.0000 mg | Freq: Once | ORAL | Status: AC | PRN

## 2024-05-06 MED ORDER — SUCCINYLCHOLINE CHLORIDE 200 MG/10ML IV SOSY
PREFILLED_SYRINGE | INTRAVENOUS | Status: DC | PRN
  Administered 2024-05-06: 140 mg via INTRAVENOUS

## 2024-05-06 MED ORDER — SUGAMMADEX SODIUM 200 MG/2ML IV SOLN
INTRAVENOUS | Status: DC | PRN
  Administered 2024-05-06: 200 mg via INTRAVENOUS

## 2024-05-06 MED ORDER — SODIUM CHLORIDE 0.9% FLUSH: 3.0000 mL | INTRAVENOUS | Status: DC | PRN

## 2024-05-06 MED ORDER — ONDANSETRON HCL 4 MG/2ML IJ SOLN
INTRAMUSCULAR | Status: AC
  Filled 2024-05-06: qty 2

## 2024-05-06 MED ORDER — ORAL CARE MOUTH RINSE
15.0000 mL | Freq: Once | OROMUCOSAL | Status: AC
  Administered 2024-05-06: 15 mL via OROMUCOSAL

## 2024-05-06 MED ORDER — HYDROMORPHONE HCL 1 MG/ML IJ SOLN
INTRAMUSCULAR | Status: AC
  Filled 2024-05-06: qty 1

## 2024-05-06 MED ORDER — SODIUM CHLORIDE 0.9 % IV SOLN
INTRAVENOUS | Status: DC | PRN
Start: 2024-05-06 — End: 2024-05-06

## 2024-05-06 MED ORDER — DEXAMETHASONE SODIUM PHOSPHATE 10 MG/ML IJ SOLN
INTRAMUSCULAR | Status: DC | PRN
  Administered 2024-05-06: 5 mg via INTRAVENOUS

## 2024-05-06 MED ORDER — MIDAZOLAM HCL 2 MG/2ML IJ SOLN
INTRAMUSCULAR | Status: DC | PRN
  Administered 2024-05-06: 2 mg via INTRAVENOUS

## 2024-05-06 MED ORDER — HEPARIN 6000 UNIT IRRIGATION SOLUTION
Status: DC | PRN
  Administered 2024-05-06: 1

## 2024-05-06 MED ORDER — HEPARIN 6000 UNIT IRRIGATION SOLUTION
Status: AC
  Filled 2024-05-06: qty 500

## 2024-05-06 MED ORDER — VANCOMYCIN HCL 1500 MG/300ML IV SOLN
1500.0000 mg | INTRAVENOUS | Status: AC
  Administered 2024-05-06: 1500 mg via INTRAVENOUS
  Filled 2024-05-06: qty 300

## 2024-05-06 MED ORDER — MIDAZOLAM HCL 2 MG/2ML IJ SOLN
INTRAMUSCULAR | Status: AC
  Filled 2024-05-06: qty 2

## 2024-05-06 MED ORDER — OXYCODONE HCL 5 MG PO TABS
ORAL_TABLET | ORAL | Status: AC
  Filled 2024-05-06: qty 1

## 2024-05-06 MED ORDER — PROPOFOL 10 MG/ML IV BOLUS
INTRAVENOUS | Status: AC
  Filled 2024-05-06: qty 20

## 2024-05-06 MED ORDER — LIDOCAINE HCL (PF) 1 % IJ SOLN
INTRAMUSCULAR | Status: AC
  Filled 2024-05-06: qty 30

## 2024-05-06 MED ORDER — SUCCINYLCHOLINE CHLORIDE 200 MG/10ML IV SOSY
PREFILLED_SYRINGE | INTRAVENOUS | Status: AC
Start: 2024-05-06 — End: ?
  Filled 2024-05-06: qty 10

## 2024-05-06 MED ORDER — HYDROMORPHONE HCL 1 MG/ML IJ SOLN
0.2500 mg | INTRAMUSCULAR | Status: DC | PRN
  Administered 2024-05-06: 0.5 mg via INTRAVENOUS

## 2024-05-06 SURGICAL SUPPLY — 25 items
ARMBAND PINK RESTRICT EXTREMIT (MISCELLANEOUS) ×1 IMPLANT
BAG COUNTER SPONGE SURGICOUNT (BAG) ×1 IMPLANT
CANISTER SUCTION 3000ML PPV (SUCTIONS) ×1 IMPLANT
CLIP LIGATING EXTRA MED SLVR (CLIP) ×1 IMPLANT
CLIP LIGATING EXTRA SM BLUE (MISCELLANEOUS) ×1 IMPLANT
COVER PROBE W GEL 5X96 (DRAPES) ×1 IMPLANT
DERMABOND ADVANCED .7 DNX12 (GAUZE/BANDAGES/DRESSINGS) ×1 IMPLANT
ELECTRODE REM PT RTRN 9FT ADLT (ELECTROSURGICAL) ×1 IMPLANT
GLOVE BIO SURGEON STRL SZ7.5 (GLOVE) ×1 IMPLANT
GOWN STRL REUS W/ TWL LRG LVL3 (GOWN DISPOSABLE) ×2 IMPLANT
GOWN STRL REUS W/ TWL XL LVL3 (GOWN DISPOSABLE) ×1 IMPLANT
KIT BASIN OR (CUSTOM PROCEDURE TRAY) ×1 IMPLANT
KIT TURNOVER KIT B (KITS) ×1 IMPLANT
NS IRRIG 1000ML POUR BTL (IV SOLUTION) ×1 IMPLANT
PACK CV ACCESS (CUSTOM PROCEDURE TRAY) ×1 IMPLANT
PAD ARMBOARD POSITIONER FOAM (MISCELLANEOUS) ×2 IMPLANT
POWDER SURGICEL 3.0 GRAM (HEMOSTASIS) IMPLANT
SLING ARM FOAM STRAP LRG (SOFTGOODS) IMPLANT
SUT MNCRL AB 4-0 PS2 18 (SUTURE) ×1 IMPLANT
SUT PROLENE 6 0 BV (SUTURE) ×1 IMPLANT
SUT SILK 0 TIES 10X30 (SUTURE) ×1 IMPLANT
SUT VIC AB 3-0 SH 27X BRD (SUTURE) ×1 IMPLANT
TOWEL GREEN STERILE (TOWEL DISPOSABLE) ×1 IMPLANT
UNDERPAD 30X36 HEAVY ABSORB (UNDERPADS AND DIAPERS) ×1 IMPLANT
WATER STERILE IRR 1000ML POUR (IV SOLUTION) ×1 IMPLANT

## 2024-05-06 NOTE — Interval H&P Note (Signed)
 History and Physical Interval Note:  05/06/2024 8:08 AM  Sean [name]  has presented today for surgery, with the diagnosis of esrd.  The various methods of treatment have been discussed with the patient and family. After consideration of risks, benefits and other options for treatment, the patient has consented to  Procedure(s): FISTULA SUPERFICIALIZATION (Left) LIGATION OF COMPETING BRANCHES OF ARTERIOVENOUS FISTULA (Left) as a surgical intervention.  The patient's history has been reviewed, patient examined, no change in status, stable for surgery.  I have reviewed the patient's chart and labs.  Questions were answered to the patient's satisfaction.     Angela Kell

## 2024-05-06 NOTE — Transfer of Care (Signed)
 Immediate Anesthesia Transfer of Care Note  Patient: Sean [name]  Procedure(s) Performed: FISTULA SUPERFICIALIZATION (Left) LIGATION OF COMPETING BRANCHES OF ARTERIOVENOUS FISTULA (Left)  Patient Location: PACU  Anesthesia Type:General  Level of Consciousness: awake, alert , and oriented  Airway & Oxygen Therapy: Patient Spontanous Breathing and Patient connected to face mask oxygen  Post-op Assessment: Report given to RN and Post -op Vital signs reviewed and stable  Post vital signs: Reviewed and stable  Last Vitals:  Vitals Value Taken Time  BP 123/67 05/06/24 1104  Temp    Pulse 67 05/06/24 1108  Resp 11 05/06/24 1108  SpO2 96 % 05/06/24 1108  Vitals shown include unfiled device data.  Last Pain:  Vitals:   05/06/24 0745  PainSc: 0-No pain         Complications: No notable events documented.

## 2024-05-06 NOTE — Anesthesia Procedure Notes (Signed)
 Procedure Name: Intubation Date/Time: 05/06/2024 9:56 AM  Performed by: Candance Certain, CRNAPre-anesthesia Checklist: Patient identified, Emergency Drugs available, Suction available and Patient being monitored Patient Re-evaluated:Patient Re-evaluated prior to induction Oxygen Delivery Method: Circle System Utilized Preoxygenation: Pre-oxygenation with 100% oxygen Induction Type: IV induction Laryngoscope Size: Glidescope and 4 Grade View: Grade II Tube type: Oral Tube size: 8.0 mm Number of attempts: 1 Airway Equipment and Method: Stylet and Oral airway Placement Confirmation: ETT inserted through vocal cords under direct vision, positive ETCO2 and breath sounds checked- equal and bilateral Secured at: 23 cm Tube secured with: Tape Dental Injury: Teeth and Oropharynx as per pre-operative assessment  Comments: G/S used in anticipation of possibly difficult airway. Patient ramped prior to induction. Atraumatic induction/intubation. Dentition and oral mucosa as per preop.

## 2024-05-06 NOTE — Anesthesia Postprocedure Evaluation (Signed)
 Anesthesia Post Note  Patient: [name]  Procedure(s) Performed: FISTULA SUPERFICIALIZATION (Left) LIGATION OF COMPETING BRANCHES OF ARTERIOVENOUS FISTULA (Left)     Patient location during evaluation: PACU Anesthesia Type: General Level of consciousness: awake and alert Pain management: pain level controlled Vital Signs Assessment: post-procedure vital signs reviewed and stable Respiratory status: spontaneous breathing, nonlabored ventilation and respiratory function stable Cardiovascular status: blood pressure returned to baseline and stable Postop Assessment: no apparent nausea or vomiting Anesthetic complications: no   No notable events documented.  Last Vitals:  Vitals:   05/06/24 1130 05/06/24 1140  BP: (!) 92/50 (!) 117/47  Pulse: 62 62  Resp: 20 (!) 26  Temp:  36.5 C  SpO2: 94% 94%    Last Pain:  Vitals:   05/06/24 1140  PainSc: 3                  [name]

## 2024-05-06 NOTE — Discharge Instructions (Signed)
   [facility]  Discharge Instructions  AV Fistula or Graft Surgery for Dialysis Access  Please refer to the following instructions for your post-procedure care. Your surgeon or physician assistant will discuss any changes with you.  Activity  You may drive the day following your surgery, if you are comfortable and no longer taking prescription pain medication. Resume full activity as the soreness in your incision resolves.  Bathing/Showering  You may shower after you go home. Keep your incision dry for 48 hours. Do not soak in a bathtub, hot tub, or swim until the incision heals completely. You may not shower if you have a hemodialysis catheter.  Incision Care  Clean your incision with mild soap and water after 48 hours. Pat the area dry with a clean towel. You do not need a bandage unless otherwise instructed. Do not apply any ointments or creams to your incision. You may have skin glue on your incision. Do not peel it off. It will come off on its own in about one week. Your arm may swell a bit after surgery. To reduce swelling use pillows to elevate your arm so it is above your heart. Your doctor will tell you if you need to lightly wrap your arm with an ACE bandage.  Diet  Resume your normal diet. There are not special food restrictions following this procedure. In order to heal from your surgery, it is CRITICAL to get adequate nutrition. Your body requires vitamins, minerals, and protein. Vegetables are the best source of vitamins and minerals. Vegetables also provide the perfect balance of protein. Processed food has little nutritional value, so try to avoid this.  Medications  Resume taking all of your medications. If your incision is causing pain, you may take over-the counter pain relievers such as acetaminophen  (Tylenol ). If you were prescribed a stronger pain medication, please be aware these medications can cause nausea and constipation. Prevent  nausea by taking the medication with a snack or meal. Avoid constipation by drinking plenty of fluids and eating foods with high amount of fiber, such as fruits, vegetables, and grains.  Do not take Tylenol  if you are taking prescription pain medications.  Follow up Your surgeon may want to see you in the office following your access surgery. If so, this will be arranged at the time of your surgery.  Please call us  immediately for any of the following conditions:  Increased pain, redness, drainage (pus) from your incision site Fever of 101 degrees or higher Severe or worsening pain at your incision site Hand pain or numbness.  Reduce your risk of vascular disease:  Stop smoking. If you would like help, call [facility] at 1-800-QUIT-NOW (5742041132) or [place] at 6476827748  Manage your cholesterol Maintain a desired weight Control your diabetes Keep your blood pressure down  Dialysis  It will take several weeks to several months for your new dialysis access to be ready for use. Your surgeon will determine when it is okay to use it. Your nephrologist will continue to direct your dialysis. You can continue to use your Permcath until your new access is ready for use.   05/06/2024 Sean [name] [id number] 1971-01-18  Surgeon(s): Adine Hoof, MD  Procedure(s): FISTULA SUPERFICIALIZATION LIGATION OF COMPETING BRANCHES OF ARTERIOVENOUS FISTULA  x Do not stick fistula until seen back in our office for incision check.     If you have any questions, please call the office at [phone].

## 2024-05-06 NOTE — Op Note (Signed)
    Patient name: Sean [name] MRN: [id number] DOB: Sep 21, 1971 Sex: male  05/06/2024 Pre-operative Diagnosis: End-stage renal disease Post-operative diagnosis:  Same Surgeon:  Ace Holder C. Vikki Graves, MD Assistant: Maryanna Smart, PA Procedure Performed:  Revision of left radiocephalic AV fistula with branch ligation and superficialization  Indications: 53 year old male with end-stage renal disease on dialysis via tunneled catheter.  He has a left arm radiocephalic AV fistula which has matured but has multiple sidebranches and is too deep for cannulation.  He is now indicated for revision with superficialization and branch ligation.  An experience assistant was necessary to facilitate exposure of the fistula as well as ligate the branches and close the skin overlying the fistula.  Findings: The fistula measured approximately 1 cm throughout its course.  There were small sidebranches which were ligated and the fistula was very deep initially but was elevated to just below the incision sites.  Patient will follow-up in 2 to 3 weeks in our office and we will discuss accessing the fistula after the wounds have healed.   Procedure:  The patient was identified in the holding area and taken to the operating room where he was placed upon operative table and LMA anesthesia was induced.  He was sterilely prepped and draped in the left upper extremity in the usual fashion, antibiotics were administered and a timeout was called.  We began using ultrasound to map the fistula throughout the forearm and identify the branches.  We then made 2 vertical incisions in the arm dissected out the entirety of the fistula in the forearm dividing branches between ties.  We then mobilized the entire fistula thin the skin over top of this close down the deep tissue with interrupted Vicryl suture and the skin was closed over top with 4-0 Monocryl.  Dermabond is placed at the skin level.  The patient was awakened from anesthesia  having tolerated the procedure well any complication.  WERE correct at completion.  EBL: 50 cc   Takahiro Godinho C. Vikki Graves, MD [facility] Office: [phone] Pager: [phone]

## 2024-05-07 ENCOUNTER — Encounter (HOSPITAL_COMMUNITY): Payer: Self-pay | Admitting: Vascular Surgery

## 2024-05-07 DIAGNOSIS — Z992 Dependence on renal dialysis: Secondary | ICD-10-CM | POA: Diagnosis not present

## 2024-05-07 DIAGNOSIS — N2581 Secondary hyperparathyroidism of renal origin: Secondary | ICD-10-CM | POA: Diagnosis not present

## 2024-05-07 DIAGNOSIS — L299 Pruritus, unspecified: Secondary | ICD-10-CM | POA: Diagnosis not present

## 2024-05-07 DIAGNOSIS — R52 Pain, unspecified: Secondary | ICD-10-CM | POA: Diagnosis not present

## 2024-05-07 DIAGNOSIS — N186 End stage renal disease: Secondary | ICD-10-CM | POA: Diagnosis not present

## 2024-05-09 ENCOUNTER — Encounter (HOSPITAL_COMMUNITY): Admission: RE | Payer: Self-pay | Source: Home / Self Care

## 2024-05-09 ENCOUNTER — Ambulatory Visit (HOSPITAL_COMMUNITY): Admission: RE | Admit: 2024-05-09 | Source: Home / Self Care | Admitting: Vascular Surgery

## 2024-05-09 DIAGNOSIS — N186 End stage renal disease: Secondary | ICD-10-CM

## 2024-05-09 SURGERY — A/V SHUNT INTERVENTION
Anesthesia: LOCAL

## 2024-05-10 DIAGNOSIS — Z992 Dependence on renal dialysis: Secondary | ICD-10-CM | POA: Diagnosis not present

## 2024-05-10 DIAGNOSIS — N186 End stage renal disease: Secondary | ICD-10-CM | POA: Diagnosis not present

## 2024-05-10 DIAGNOSIS — R52 Pain, unspecified: Secondary | ICD-10-CM | POA: Diagnosis not present

## 2024-05-10 DIAGNOSIS — L299 Pruritus, unspecified: Secondary | ICD-10-CM | POA: Diagnosis not present

## 2024-05-10 DIAGNOSIS — N2581 Secondary hyperparathyroidism of renal origin: Secondary | ICD-10-CM | POA: Diagnosis not present

## 2024-05-12 DIAGNOSIS — N2581 Secondary hyperparathyroidism of renal origin: Secondary | ICD-10-CM | POA: Diagnosis not present

## 2024-05-12 DIAGNOSIS — N186 End stage renal disease: Secondary | ICD-10-CM | POA: Diagnosis not present

## 2024-05-12 DIAGNOSIS — L299 Pruritus, unspecified: Secondary | ICD-10-CM | POA: Diagnosis not present

## 2024-05-12 DIAGNOSIS — Z992 Dependence on renal dialysis: Secondary | ICD-10-CM | POA: Diagnosis not present

## 2024-05-12 DIAGNOSIS — R52 Pain, unspecified: Secondary | ICD-10-CM | POA: Diagnosis not present

## 2024-05-13 ENCOUNTER — Encounter: Payer: Self-pay | Admitting: Internal Medicine

## 2024-05-13 ENCOUNTER — Ambulatory Visit (INDEPENDENT_AMBULATORY_CARE_PROVIDER_SITE_OTHER): Admitting: Internal Medicine

## 2024-05-13 ENCOUNTER — Other Ambulatory Visit (HOSPITAL_BASED_OUTPATIENT_CLINIC_OR_DEPARTMENT_OTHER): Payer: Self-pay

## 2024-05-13 DIAGNOSIS — Z5689 Other problems related to employment: Secondary | ICD-10-CM

## 2024-05-13 DIAGNOSIS — G4733 Obstructive sleep apnea (adult) (pediatric): Secondary | ICD-10-CM

## 2024-05-13 DIAGNOSIS — Z6841 Body Mass Index (BMI) 40.0 and over, adult: Secondary | ICD-10-CM

## 2024-05-13 DIAGNOSIS — K760 Fatty (change of) liver, not elsewhere classified: Secondary | ICD-10-CM

## 2024-05-13 DIAGNOSIS — N186 End stage renal disease: Secondary | ICD-10-CM

## 2024-05-13 MED ORDER — TIRZEPATIDE-WEIGHT MANAGEMENT 2.5 MG/0.5ML ~~LOC~~ SOAJ
2.5000 mg | SUBCUTANEOUS | 11 refills | Status: DC
  Filled 2024-05-13: qty 2, 28d supply, fill #0

## 2024-05-13 NOTE — Progress Notes (Unsigned)
 ==============================  [facility]: [phone]   -- Medical Office Visit --  Patient: Sean [name]      Age: 53 y.o.       Sex:  male  Date:   05/13/2024 Today's Healthcare Provider: Anthon Kins, MD  ==============================   Chief Complaint: Follow-up   Discussed the use of AI scribe software for clinical note transcription with the patient, who gave verbal consent to proceed.  History of Present Illness Sean [name] is a 53 year old male with end-stage renal disease who presents for medication management and insurance issues related to Mounjaro  and Zepbound . He is accompanied by his wife.  He is currently undergoing dialysis three times a week. Recently, he underwent a procedure to revise his fistula due to concerns about vein size and a potential twist. Post-procedure, he experienced swelling and tenderness in his arm and wrist. No nerve damage in his hand.  He has been facing issues with insurance coverage for Mounjaro , a medication he was previously on. The insurance company has denied coverage, citing the need for further communication with the pharmacy or physician. He is considering an appeal to address this issue.  He has a history of obesity and sleep apnea, for which he has been prescribed Zepbound . The insurance company has also denied coverage for Zepbound , despite having FDA-approved indications for his conditions. He has tried various diets, including low carb and Mediterranean, but has struggled with weight loss due to a persistent appetite, especially after dialysis.  He has a history of fatty liver disease and has not been able to maintain a consistent diet or exercise regimen due to arthritis and other health issues. He has a history of heart surgery and arthritis, which limits his physical activity. He experiences occasional muscle cramps. He is not currently taking torsemide  regularly, only  as needed for swelling.  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  Lab Results  Component Value Date   HGBA1C 6.1 03/14/2024   HGBA1C 5.6 06/17/2023   HGBA1C 5.2 09/30/2022       {{# PERSONAL SPECIALTY NOTES - NEXT VISIT  ## Key Follow-ups: - **ESRD/Nephrology**: Check if Mounjaro  was approved by nephrology; document dry weight/dialysis adequacy - **GI Status**: Evaluate response to Carafate  for ulcer symptoms; reassess vomiting frequency - **Weight Management**: Document response to Mounjaro  2.5mg  weekly - any side effects or dehydration? - **Disability**: Verify if patient connected with [facility] team and progress on SSDI application  ## Care Gaps: - Permanent dialysis access planning (still using catheter placed 01/11/24) - Thrombocytopenia evaluation (platelets consistently 66-70K) - CPAP compliance remains poor - follow up on mask tolerance - No recent cardiology follow-up despite CAD/elevated BNP (430.6)  ## Critical Labs to Review: - Creatinine trend (baseline 5.98-6.95) - Potassium levels (risk with GLP-1 therapy) - Liver enzymes (monitor NASH) - Platelet count (chronic thrombocytopenia)  ## Medication Adjustments to Consider: - Mounjaro : Evaluate for dose adjustment based on tolerance - Carafate : Determine if continued therapy needed - Consider restarting allopurinol  if no active ulcer (was on problem list)  ## Personal Reminders: - Document weight change (last: 375 lbs, down from 400) - Review ankle measurements if patient followed recommendation - Check insurance coverage confirmation for Repatha  - Consider GI referral if ulcer symptoms persist  ## Clinical Context: - Complex patient with interacting conditions: ESRD, CAD, PUD, obesity, NASH - Recent hospitalization (01/05/24) for  volume management - Significant weight loss effort showing modest results - Will need careful coordination  with nephrology regarding medication adjustments:1}{ Problem List as of 05/13/2024 Reviewed: 04/23/2024 10:33 AM by Anthon Kins, MD    Abnormal bowel habits   Last Assessment & Plan 04/22/2024 Office Visit Written 04/23/2024 10:30 AM by Anthon Kins, MD  He gets occasional discomfort lower abdomen and irregular bowel movements. Updated problem overview for this problem to improve longitudinal management. Encouraged patient to add more fiber.      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 Savannah Curlin leaving No results found for: GGT so will order Also fib-4 N/A will update for upcoming liver specialist       Allergic rhinitis due to pollen   Amitriptyline  adverse reaction   Last Assessment & Plan 03/02/2023 Office Visit Written 03/02/2023  2:02 PM by Anthon Kins, MD  He meant that he always wear his CPAP which she already does but there is especially important when taking amitriptyline .  I also advised him there are many interactions with this medication.  At this time he feels it helps him to wake up without feeling sick in the morning so I suggested may be just cutting it out for a day and seeing if he can get by without it now that his stomach seems to have gotten better after antibiotics last month.  If he is unable to stop it without waking up feeling sick then return to just taking it every day it is 1 to make sure that it is medically necessary before we stay on it      Anemia in chronic kidney disease   Asthma, chronic   Last Assessment & Plan 01/08/2024 Hospital Encounter Written 01/09/2024 10:19 AM by Bennie Brave, MD  Not in exacerbation, continue with Singulair       Atherosclerotic heart disease of native coronary artery without angina pectoris   Last Assessment & Plan 02/05/2024 Office Visit  Written 02/06/2024  8:15 PM by Anthon Kins, MD  alternative therapy (Repatha ). Plan: Continue diltiazem  (Cardizem ) 120mg  twice daily Continue Eliquis  5mg  twice daily Continue Repatha  injections every 2 weeks for LDL management (statin contraindicated) Maintain nitroglycerin  0.4mg  SL prn for chest pain Monitor for cardiac symptoms Encourage heart-healthy lifestyle modifications      Atrial flutter with rapid ventricular response [facility])   Last Assessment & Plan 01/08/2024 Hospital Encounter Written 01/09/2024 10:17 AM by Bennie Brave, MD  Chronically documented.  Since the plan for the patient is to get dialysis initiated, I am holding patient's apixaban .      Bleeding diathesis [facility])   Last Assessment & Plan 05/04/2023 Office Visit Edited 05/04/2023 12:39 PM by Anthon Kins, MD  Following with hematology Advised patient to discuss with hematologist and cardiologist I defer to them about eliquis  Gave them info about needs to go to ER if any blood in stool       Chronic combined systolic (congestive) and diastolic (congestive) heart failure (HCC)   Chronic gout of multiple sites   Last Assessment & Plan 05/04/2023 Office Visit Written 05/04/2023 12:42 PM by Anthon Kins, MD  Need a uric acid but its low priority- didn't discuss getting today so will do next blood draw      Chronic liver failure without hepatic coma (  [facility])   Last Assessment & Plan 04/22/2024 Office Visit Written 04/23/2024 10:30 AM by Anthon Kins, MD  Advanced fatty liver disease affects liver function and causes blood cell destruction. Weight loss is recommended to improve liver function. Mounjaro  is considered to aid in weight loss. Insurance and cost issues with Mounjaro  are discussed, with potential pharmacy options and savings cards considered. Prescribe Mounjaro  for weight loss, starting at 2.5 mg if possible. Attempt to obtain Mounjaro  through [facility]; if not possible, try [facility]. Consider online  savings cards to reduce the cost of Mounjaro . Monitor liver function and weight loss progress.      Chronic venous stasis dermatitis of both lower extremities   Last Assessment & Plan 02/05/2024 Office Visit Written 02/06/2024  8:15 PM by Anthon Kins, MD  Assessment: Bilateral lower extremity involvement, likely exacerbated by fluid status and obesity. Plan: Continue sequential compression devices as recommended Apply ketoconazole  cream to affected areas twice daily as needed Elevate legs when sitting or lying down Consider compression stockings if tolerated      Coagulation defect, unspecified (HCC)   Dependence on renal dialysis [facility])   Disability affecting daily living   Last Assessment & Plan 02/12/2024 Office Visit Written 02/13/2024  3:38 PM by Anthon Kins, MD   Transportation Assistance   He needs transportation assistance for dialysis appointments. Family members currently help, but he seeks additional options due to post-dialysis weakness affecting his ability to drive. Refer to a Child psychotherapist to explore transportation assistance options such as free bus passes.  Financial Assistance   He seeks financial assistance due to inability to work and dialysis costs. 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 He is advised to reduce income and assets to qualify for Medicaid. Refer to a Child psychotherapist to assist with financial planning and Medicaid application. 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      Ectatic aorta (HCC)   ESRD (end stage renal disease) [facility])   Last Assessment & Plan 03/14/2024 Office Visit Edited 03/15/2024  6:18 PM by Anthon Kins, MD  End-Stage Renal Disease (ESRD) on Hemodialysis (HCC) (Diagnosed ~01/13/2024, previously CKD Stage 4 noted 01/04/2016) Current Status: Actively undergoing thrice-weekly hemodialysis. Current labs  (03/14/2024) show Cr 10.96 mg/dL, BUN 69 mg/dL, GFR 1.47 mL/min. Patient experiences significant post-dialysis nausea, vomiting, hypotension (SBP as low as 80s), weakness, and cramping. Hyperphosphatemia noted (Phos 8.1 mg/dL). Chronic anemia (Hgb 11.0 g/dL) and low albumin  (3.3 g/dL) persist. Compliant with dialysis schedule; adherence to 32 oz fluid restriction mentioned previously. Etiology considered secondary to HTN and possibly plasma cell dyscrasia (MGUS). Recent hospitalization 01/05/2024 (reason not specified but likely ESRD-related); dialysis catheter placed 01/11/2024. Management Plan: Continue hemodialysis 3x/week. Continue Calcium  Acetate 1334mg  PO TID w/ meals (phosphate binder). Continue Iron Sucrose infusions and dialysis vitamins (B Complex/Folic Acid ). Strongly reinforce adherence to low-phosphate diet. Monitor labs closely (CMP, CBC, Mg, Phos ordered 03/14/24). Urgent consultation with Nephrology (Dr. Lydia Sams) to adjust dialysis prescription aiming to mitigate intradialytic hypotension and cramping.       Gastroesophageal reflux disease without esophagitis   Last Assessment & Plan 02/05/2024 Office Visit Written 02/06/2024  8:15 PM by Anthon Kins, MD  He experiences heartburn and vomiting, possibly related to peptic ulcer disease. Sensitivity to certain foods suggests an active ulcer. Carafate  is prescribed to coat the ulcer and aid healing. He should continue Carafate  for  up to a month after symptom resolution and avoid spicy foods and other known irritants to prevent symptom exacerbation. Monitoring for symptoms of heartburn and vomiting is advised.\      Gout   Last Assessment & Plan 01/08/2024 Hospital Encounter Written 01/09/2024 10:19 AM by Bennie Brave, MD  Chronic, continue with allopurinol       Gout due to renal impairment, unspecified site   Herpes simplex   History of coronary artery bypass surgery   History of umbilical hernia repair   Last Assessment & Plan 10/13/2022  Office Visit Edited 10/13/2022  2:41 PM by Anthon Kins, MD  Encouraged weight loss      Hyperlipidemia   Last Assessment & Plan 03/14/2024 Office Visit Written 03/15/2024  5:56 PM by Anthon Kins, MD  Mixed Hyperlipidemia: Assessment: Elevated LDL (124), Total Cholesterol (208), Triglycerides (167). Statin intolerant. Plan:  Continue Evolocumab  (Repatha ) 140mg  SQ every 14 days. Continue Lifestyle/Dietary management. Check lipid panel (ordered today).      Hypertension   Last Assessment & Plan 04/22/2024 Office Visit Edited 04/23/2024 10:33 AM by Anthon Kins, MD  BP Readings from Last 3 Encounters:  04/22/24 122/68  04/22/24 (!) 108/58  04/18/24 (!) 100/51  Well-controlled Was running borderline low so I wanted to monitor closely. continues on, as of 04/23/2024 Current hypertension medications:       Sig   losartan  (COZAAR ) 50 MG tablet (Taking) Take 50 mg by mouth daily.   metoprolol  succinate (TOPROL -XL) 25 MG 24 hr tablet (Taking) Take 1 tablet (25 mg total) by mouth daily.            Hypertensive chronic kidney disease with stage 5 chronic kidney disease or end stage renal disease [facility])   Hypothyroid   Last Assessment & Plan 10/13/2022 Office Visit Written 10/13/2022  2:10 PM by Anthon Kins, MD  He takes 50 mcg of levothyroxine  every morning before breakfast and I encouraged him to give at least an hour every time afterwards and before without taking anything else but water Lab Results  Component Value Date   TSH 2.96 09/03/2022         Inflamed internal hemorrhoid   Iron deficiency anemia, unspecified   Malaise and fatigue   Metabolic dysfunction-associated steatohepatitis (MASH)   Last Assessment & Plan 11/02/2023 Office Visit Written 11/02/2023  8:30 PM by Anthon Kins, MD  Liver Disease   His liver disease complicates overall health and increases bleeding risk, potentially contributing to GI symptoms and overall poor health. We will monitor  liver function tests during the ER visit and discuss the potential need for earlier fistula placement with a nephrologist if significant bleeding is confirmed.      MGUS (monoclonal gammopathy of unknown significance)   Last Assessment & Plan 03/14/2024 Office Visit Written 03/15/2024  5:56 PM by Anthon Kins, MD  Monoclonal Gammopathy of Unknown Significance (MGUS) / Thrombocytopenia: Assessment: Known MGUS (labs from 01/18/24 showed elevated IgA, M-protein, abnormal FLC ratio). 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 Anemia also present. Likely contributes to hematologic abnormalities alongside CKD/Liver disease. Plan:  Continue Apixaban  (Eliquis ) 5mg  PO BID for Atrial Flutter (risk/benefit assessed in context of thrombocytopenia). Monitor CBC periodically (ordered today). Hematology follow-up as previously established/indicated.      Mild protein-calorie malnutrition (HCC)   Morbid obesity [facility])   Last Assessment & Plan 04/22/2024 Office Visit Written 04/23/2024 10:30 AM by Anthon Kins, MD  Restart Mounjaro , will possibly need reapproved Was held due to nausea/peptic ulcer  disease/progression of renal disease to dialysis but now approved to resume from nephrological standpoint.      Nausea and vomiting   Last Assessment & Plan 03/14/2024 Office Visit Written 03/15/2024  5:56 PM by Anthon Kins, MD   Post-Dialysis Nausea, Vomiting, and Hypotension: Assessment: Symptoms likely multifactorial, related to fluid shifts and electrolyte imbalances during dialysis (intradialytic hypotension, potential transient hyponatremia or hypocalcemia despite current normal labs), uremia, possible contribution from GERD/stress ulcer, and potential side effects from medications (e.g., amitriptyline ). Current BP 110/60, but history of significant drops post-HD. Plan:  Hold Amitriptyline : Assess if discontinuation improves post-dialysis symptoms. Continue Sucralfate : 1g PO QHS for suspected stress  ulcer/gastritis. Ondansetron : Continue 4mg  ODT PO Q8H PRN nausea/vomiting. Nephrology Consultation: Urgent communication with Dr. Lydia Sams (Nephrology) recommended to review dialysis prescription (e.g., ultrafiltration rate, sodium/calcium  modeling, dialysate buffer) to minimize hypotension and cramping. Monitoring: Encourage checking post-dialysis BP. Consider checking post-dialysis electrolytes (Na, Ca) if symptoms persist despite dialysis adjustments. Diet: Encourage adequate nutritional intake, especially a balanced meal, before dialysis sessions.      Obstructive sleep apnea syndrome   Last Assessment & Plan 04/22/2024 Office Visit Written 04/23/2024 10:30 AM by Anthon Kins, MD  Part of why we want Mounjaro /Zepbound       Other chronic pain   Other disorders of phosphorus metabolism   Other fluid overload   Other iron deficiency anemias   Other long term (current) drug therapy   Prediabetes   Last Assessment & Plan 03/14/2024 Office Visit Written 03/15/2024  5:56 PM by Anthon Kins, MD  Prediabetes / Hyperglycemia: Assessment: Random glucose 152, HbA1c 6.1%. Sitagliptin recently discontinued (was used off-label). Plan:  Check HbA1c (ordered today). Continue Lifestyle/Dietary management as appropriate within renal diet restrictions. No current indication for pharmacologic therapy for glucose control based on A1c.      Pruritus, unspecified   PUD (peptic ulcer disease)   Last Assessment & Plan 03/23/2024 Office Visit Written 03/23/2024  7:02 PM by Anthon Kins, MD  Cyclical vomiting of unknown cause so we have been keeping on proton pump inhibitor (PPI) stomach acid reducer until that calms down despite kidney disease risk. He sometimes gets some dyspepsis still even with it.      Pure hypercholesterolemia, unspecified   Rectal bleeding   Recurrent sinusitis   Last Assessment & Plan 03/23/2024 Office Visit Written 03/23/2024  7:02 PM by Anthon Kins, MD  Chronic sinusitis  with recent episodes of sinus drainage and minor bleeding is exacerbated by pollen exposure. Current management includes antibiotics and sinus rinses. Provide a sinus care handout and advise the use of sinus rinses and nasal steroid spray (Flonase ) to manage symptoms.  Currently pollen quite bad likely flaring.      Secondary hyperparathyroidism of renal origin [facility])   Shortness of breath   Sinus bradycardia   Splenomegaly   Spondylolisthesis   Statins contraindicated   SVT (supraventricular tachycardia) (HCC)   Thrombocytopenia [facility])   Last Assessment & Plan 02/05/2024 Office Visit Written 02/06/2024  8:15 PM by Anthon Kins, MD  Assessment: Persistent thrombocytopenia with platelets ranging 66-70 K/uL. Likely multifactorial related to ESRD, chronic liver disease, and potentially medication effects. Plan: Continue monitoring platelet counts with regular blood work No specific intervention required at this time given stability Avoid medications that may worsen thrombocytopenia      :1}}  Updated Problem List Entries: No problems updated.  Background Reviewed: Problem List: has History of umbilical hernia repair; Hypertension; Hyperlipidemia; Thrombocytopenia (HCC); Sinus bradycardia;  Atherosclerotic heart disease of native coronary artery without angina pectoris; Atrial flutter with rapid ventricular response (HCC); Morbid obesity (HCC); Other long term (current) drug therapy; History of coronary artery bypass surgery; Obstructive sleep apnea syndrome; Chronic gout of multiple sites; Alkaline phosphatase elevation; Gout; Ectatic aorta (HCC); Gastroesophageal reflux disease without esophagitis; Herpes simplex; Metabolic dysfunction-associated steatohepatitis (MASH); Prediabetes; Statins contraindicated; Hypothyroid; Chronic venous stasis dermatitis of both lower extremities; Nausea and vomiting; Chronic liver failure without hepatic coma (HCC); Splenomegaly; Spondylolisthesis; Bleeding  diathesis (HCC); Malaise and fatigue; Allergic rhinitis due to pollen; Amitriptyline  adverse reaction; MGUS (monoclonal gammopathy of unknown significance); 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; Rectal bleeding; Recurrent sinusitis; Asthma, chronic; ESRD (end stage renal disease) (HCC); SVT (supraventricular tachycardia) (HCC); PUD (peptic ulcer disease); Disability affecting daily living; Inflamed internal hemorrhoid; Anemia in chronic kidney disease; Chronic combined systolic (congestive) and diastolic (congestive) heart failure (HCC); Coagulation defect, unspecified (HCC); Dependence on renal dialysis (HCC); Gout due to renal impairment, unspecified site; Hypertensive chronic kidney disease with stage 5 chronic kidney disease or end stage renal disease (HCC); Mild protein-calorie malnutrition (HCC); Other chronic pain; Other disorders of phosphorus metabolism; Other fluid overload; Iron deficiency anemia, unspecified; Other iron deficiency anemias; Pruritus, unspecified; Pure hypercholesterolemia, unspecified; Secondary hyperparathyroidism of renal origin (HCC); Shortness of breath; and Abnormal bowel habits on their problem list. Past Medical History:  has a past medical history of Abnormal liver enzymes, Acute renal failure superimposed on stage 5 chronic kidney disease, not on chronic dialysis (HCC) (01/08/2024), AKI (acute kidney injury) (HCC) (06/17/2023), Atrial fibrillation (HCC), Cardiac cirrhosis, Chronic combined systolic and diastolic CHF (congestive heart failure) (HCC), Chronic kidney disease, stage 4 (severe) (HCC) (01/04/2016), CKD (chronic kidney disease) stage 3, GFR 30-59 ml/min (HCC) (01/04/2016), CKD (chronic kidney disease), stage II, Congenital heart defect, Coronary artery disease, Dyspnea, Gastric polyps (11/25/2022), Gastritis and gastroduodenitis (11/25/2022), Gastroesophageal reflux disease with esophagitis and hemorrhage (04/04/2021), Hematuria, History of umbilical hernia  repair (07/10/2011), Hypercholesteremia, Hypertension, Incisional hernia (07/10/2011), Morbid obesity (HCC), Morbid obesity with BMI of 50.0-59.9, adult (HCC) (01/04/2016), Nausea and vomiting (11/25/2022), OSA on CPAP, Paroxysmal atrial flutter (HCC) (02/12/2016), PONV (postoperative nausea and vomiting), 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 Past Surgical History:   has a past surgical history that includes Gallbladder surgery; Appendectomy; Hernia repair; Cardiac catheterization (N/A, 12/24/2015); Coronary artery bypass graft (N/A, 12/26/2015); TEE without cardioversion (N/A, 12/26/2015); Cardioversion (N/A, 02/14/2016); TEE without cardioversion (N/A, 02/14/2016); LEFT HEART CATH AND CORS/GRAFTS ANGIOGRAPHY (N/A, 04/15/2017); LEFT HEART CATH AND CORS/GRAFTS ANGIOGRAPHY (N/A, 11/02/2020); Colonoscopy with propofol  (N/A, 02/19/2021); Esophagogastroduodenoscopy (egd) with propofol  (N/A, 02/19/2021); biopsy (02/19/2021); polypectomy (02/19/2021); Esophagogastroduodenoscopy (egd) with propofol  (N/A, 11/25/2022); biopsy (11/25/2022); IR Transcatheter BX (06/26/2023); IR US  Guide Vasc Access Right (06/26/2023); IR Venogram Hepatic W Hemodynamic Evaluation (06/26/2023); AV fistula placement (Left, 01/11/2024); Insertion of dialysis catheter (Right, 01/11/2024); Fistula superficialization (Left, 05/06/2024); and Ligation of competing branches of arteriovenous fistula (Left, 05/06/2024). Social History:   reports that he has never smoked. He has never used smokeless tobacco. He reports that he does not currently use alcohol. He reports that he does not use drugs. Family History:  family history includes CAD in his father; Diabetes in his father, maternal grandfather, and maternal uncle; Heart attack in an other family member; Hyperlipidemia in an other family member. Allergies:  is allergic to glucophage  [metformin ], vibra -tab [doxycycline ], chlorhexidine , chlorhexidine  gluconate, augmentin  [amoxicillin-pot clavulanate], imdur  [isosorbide  dinitrate], tape, and valacyclovir .   Medication Reconciliation: Current Outpatient Medications on File Prior to  Visit  Medication Sig  . allopurinol  (ZYLOPRIM ) 100 MG tablet Take 100 mg by mouth daily.  . amitriptyline  (ELAVIL ) 25 MG tablet TAKE 1 TABLET BY MOUTH EVERYDAY AT BEDTIME  . azelastine  (ASTELIN ) 0.1 % nasal spray Place 2 sprays into both nostrils 2 (two) times daily. Use in each nostril as directed  . B Complex-C-Folic Acid  (DIALYVITE TABLET) TABS Take 1 tablet by mouth daily.  . Cholecalciferol (VITAMIN D3) 125 MCG (5000 [place]) CAPS Take 1 capsule (5,000 Units total) by mouth daily at 12 noon.  . Cyanocobalamin  (VITAMIN B-12) 5000 MCG SUBL Place 5,000 mcg under the tongue daily.  . diltiazem  (CARDIZEM  SR) 120 MG 12 hr capsule Take 120 mg by mouth 2 (two) times daily.  . ELIQUIS  5 MG TABS tablet TAKE 1 TABLET(5 MG) BY MOUTH TWICE DAILY  . Evolocumab  (REPATHA ) 140 MG/ML SOSY Inject 140 mg into the skin every 14 (fourteen) days.  Aaron Aas lanthanum (FOSRENOL) 1000 MG chewable tablet Chew 1,000 mg by mouth 3 (three) times daily with meals.  . metoprolol  succinate (TOPROL -XL) 25 MG 24 hr tablet Take 1 tablet (25 mg total) by mouth daily.  . montelukast  (SINGULAIR ) 10 MG tablet Take 10 mg by mouth at bedtime.  . nitroGLYCERIN  (NITROSTAT ) 0.4 MG SL tablet Place 1 tablet (0.4 mg total) under the tongue every 5 (five) minutes as needed for chest pain.  . pantoprazole  (PROTONIX ) 40 MG tablet Take 40 mg by mouth daily. Take additional 40 mg at bedtime if needed  . sucralfate  (CARAFATE ) 1 g tablet Take 1 tablet (1 g total) by mouth at bedtime. (Patient taking differently: Take 1 g by mouth at bedtime as needed (Stomach pain).)  . tirzepatide  (MOUNJARO ) 2.5 MG/0.5ML Pen Inject 2.5 mg into the skin once a week.  . torsemide  (DEMADEX ) 10 MG tablet Take by mouth.  . folic acid -vitamin b complex-vitamin c-selenium-zinc (DIALYVITE) 3 MG TABS tablet Take 1  tablet by mouth daily. (Patient not taking: Reported on 05/13/2024)  . iron sucrose in sodium chloride  0.9 % 100 mL Iron Sucrose (Venofer)  . Methoxy PEG-Epoetin Beta (MIRCERA IJ) 75 mcg.  . nystatin  (MYCOSTATIN /NYSTOP ) powder Apply 1 Application topically 3 (three) times daily. (Patient not taking: Reported on 05/13/2024)  . ondansetron  (ZOFRAN -ODT) 4 MG disintegrating tablet Take 1 tablet (4 mg total) by mouth every 8 (eight) hours as needed for nausea or vomiting. (Patient not taking: Reported on 05/04/2024)  . oxyCODONE -acetaminophen  (PERCOCET) 5-325 MG tablet Take 1 tablet by mouth every 6 (six) hours as needed for severe pain (pain score 7-10). (Patient not taking: Reported on 05/13/2024)   Current Facility-Administered Medications on File Prior to Visit  Medication  . technetium tetrofosmin  (TC-MYOVIEW ) injection 29.8 millicurie  There are no discontinued medications.   Physical Exam:    05/13/2024   11:13 AM 05/06/2024   11:40 AM 05/06/2024   11:30 AM  Vitals with BMI  Height 5' 11    Weight 373 lbs 10 oz    BMI 52.13    Systolic 104 117 92  Diastolic 62 47 50  Pulse 62 62 62  Vital signs reviewed.  Nursing notes reviewed. Weight trend reviewed. Physical Exam General Appearance:  No acute distress appreciable.   Well-groomed, healthy-appearing male.  Well proportioned with no abnormal fat distribution.  Good muscle tone. Pulmonary:  Normal work of breathing at rest, no respiratory distress apparent. SpO2: 98 %  Musculoskeletal: All extremities are intact.  Neurological:  Awake, alert, oriented, and engaged.  No obvious focal neurological  deficits or cognitive impairments.  Sensorium seems unclouded.   Speech is clear and coherent with logical content. Psychiatric:  Appropriate mood, pleasant and cooperative demeanor, thoughtful and engaged during the exam Physical Exam  ***   {Insert previous labs (optional):23779} {See past labs  Heme  Chem  Endocrine  Serology  Results  Review (optional):1} No results found for any visits on 05/13/24. Admission on 05/06/2024, Discharged on 05/06/2024  Component Date Value Ref Range Status  . Sodium 05/06/2024 135  135 - 145 mmol/L Final  . Potassium 05/06/2024 3.7  3.5 - 5.1 mmol/L Final  . Chloride 05/06/2024 101  98 - 111 mmol/L Final  . BUN 05/06/2024 38 (H)  6 - 20 mg/dL Final  . Creatinine, Ser 05/06/2024 7.50 (H)  0.61 - 1.24 mg/dL Final  . Glucose, Bld 16/09/9603 166 (H)  70 - 99 mg/dL Final  . Calcium , Ion 05/06/2024 1.02 (L)  1.15 - 1.40 mmol/L Final  . TCO2 05/06/2024 23  22 - 32 mmol/L Final  . Hemoglobin 05/06/2024 11.2 (L)  13.0 - 17.0 g/dL Final  . HCT 54/08/8118 33.0 (L)  39.0 - 52.0 % Final  Scanned Document on 03/24/2024  Component Date Value Ref Range Status  . Creatinine, POC 05/30/2023 95.2  mg/dL Final  . EGFR 14/78/2956 18.0   Final  Office Visit on 03/14/2024  Component Date Value Ref Range Status  . Cholesterol 03/14/2024 208 (H)  0 - 200 mg/dL Final  . Triglycerides 03/14/2024 167.0 (H)  0.0 - 149.0 mg/dL Final  . HDL 21/30/8657 50.60  >39.00 mg/dL Final  . VLDL 84/69/6295 33.4  0.0 - 40.0 mg/dL Final  . LDL Cholesterol 03/14/2024 124 (H)  0 - 99 mg/dL Final  . Total CHOL/HDL Ratio 03/14/2024 4   Final  . NonHDL 03/14/2024 157.38   Final  . Sodium 03/14/2024 137  135 - 145 mEq/L Final  . Potassium 03/14/2024 4.3  3.5 - 5.1 mEq/L Final  . Chloride 03/14/2024 98  96 - 112 mEq/L Final  . CO2 03/14/2024 22  19 - 32 mEq/L Final  . Glucose, Bld 03/14/2024 152 (H)  70 - 99 mg/dL Final  . BUN 28/41/3244 69 (H)  6 - 23 mg/dL Final  . Creatinine, Ser 03/14/2024 10.96 (HH)  0.40 - 1.50 mg/dL Final  . Total Bilirubin 03/14/2024 1.6 (H)  0.2 - 1.2 mg/dL Final  . Alkaline Phosphatase 03/14/2024 244 (H)  39 - 117 U/L Final  . AST 03/14/2024 55 (H)  0 - 37 U/L Final  . ALT 03/14/2024 64 (H)  0 - 53 U/L Final  . Total Protein 03/14/2024 6.1  6.0 - 8.3 g/dL Final  . Albumin  03/14/2024 3.3 (L)  3.5 - 5.2  g/dL Final  . GFR 12/03/7251 4.94 (LL)  >60.00 mL/min Final  . Calcium  03/14/2024 9.2  8.4 - 10.5 mg/dL Final  . WBC 66/44/0347 7.8  4.0 - 10.5 K/uL Final  . RBC 03/14/2024 3.07 (L)  4.22 - 5.81 Mil/uL Final  . Hemoglobin 03/14/2024 11.0 (L)  13.0 - 17.0 g/dL Final  . HCT 42/59/5638 32.0 (L)  39.0 - 52.0 % Final  . MCV 03/14/2024 104.2 (H)  78.0 - 100.0 fl Final  . MCHC 03/14/2024 34.4  30.0 - 36.0 g/dL Final  . RDW 75/64/3329 15.9 (H)  11.5 - 15.5 % Final  . Platelets 03/14/2024 89.0 (L)  150.0 - 400.0 K/uL Final  . Neutrophils Relative % 03/14/2024 59.4  43.0 - 77.0 % Final  .  Lymphocytes Relative 03/14/2024 17.6  12.0 - 46.0 % Final  . Monocytes Relative 03/14/2024 16.0 (H)  3.0 - 12.0 % Final  . Eosinophils Relative 03/14/2024 6.6 (H)  0.0 - 5.0 % Final  . Basophils Relative 03/14/2024 0.4  0.0 - 3.0 % Final  . Neutro Abs 03/14/2024 4.6  1.4 - 7.7 K/uL Final  . Lymphs Abs 03/14/2024 1.4  0.7 - 4.0 K/uL Final  . Monocytes Absolute 03/14/2024 1.2 (H)  0.1 - 1.0 K/uL Final  . Eosinophils Absolute 03/14/2024 0.5  0.0 - 0.7 K/uL Final  . Basophils Absolute 03/14/2024 0.0  0.0 - 0.1 K/uL Final  . TSH 03/14/2024 2.070  0.450 - 4.500 uIU/mL Final  . Hgb A1c MFr Bld 03/14/2024 6.1  4.6 - 6.5 % Final  . Magnesium  03/14/2024 2.4  1.5 - 2.5 mg/dL Final  . Phosphorus 16/09/9603 8.1 (H)  2.3 - 4.6 mg/dL Final  Appointment on 54/08/8118  Component Date Value Ref Range Status  . WBC Count 01/18/2024 7.4  4.0 - 10.5 K/uL Final  . RBC 01/18/2024 3.33 (L)  4.22 - 5.81 MIL/uL Final  . Hemoglobin 01/18/2024 11.4 (L)  13.0 - 17.0 g/dL Final  . HCT 14/78/2956 33.4 (L)  39.0 - 52.0 % Final  . MCV 01/18/2024 100.3 (H)  80.0 - 100.0 fL Final  . MCH 01/18/2024 34.2 (H)  26.0 - 34.0 pg Final  . MCHC 01/18/2024 34.1  30.0 - 36.0 g/dL Final  . RDW 21/30/8657 14.8  11.5 - 15.5 % Final  . Platelet Count 01/18/2024 70 (L)  150 - 400 K/uL Final  . nRBC 01/18/2024 0.00  0.0 - 0.2 % Final  . Neutrophils  Relative % 01/18/2024 48  % Final  . Neutro Abs 01/18/2024 3.6  1.7 - 7.7 K/uL Final  . Lymphocytes Relative 01/18/2024 26  % Final  . Lymphs Abs 01/18/2024 1.9  0.7 - 4.0 K/uL Final  . Monocytes Relative 01/18/2024 16  % Final  . Monocytes Absolute 01/18/2024 1.2 (H)  0.1 - 1.0 K/uL Final  . Eosinophils Relative 01/18/2024 8  % Final  . Eosinophils Absolute 01/18/2024 0.6 (H)  0.0 - 0.5 K/uL Final  . Basophils Relative 01/18/2024 1  % Final  . Basophils Absolute 01/18/2024 0.1  0.0 - 0.1 K/uL Final  . Immature Granulocytes 01/18/2024 1  % Final  . Abs Immature Granulocytes 01/18/2024 0.07  0.00 - 0.07 K/uL Final  . nRBC 01/18/2024 0  0 /100 WBC Final  . Immature Platelet Fraction 01/18/2024 0.3 (L)  1.2 - 8.6 % Final  . Ferritin 01/18/2024 114  24 - 336 ng/mL Final  . Iron 01/18/2024 152  45 - 182 ug/dL Final  . TIBC 84/69/6295 456 (H)  250 - 450 ug/dL Final  . Saturation Ratios 01/18/2024 33  17.9 - 39.5 % Final  . UIBC 01/18/2024 304  ug/dL Final  . Vitamin B-12 28/41/3244 6,881 (H)  180 - 914 pg/mL Final  . IgG (Immunoglobin G), Serum 01/18/2024 561 (L)  603 - 1,613 mg/dL Final  . IgA 12/03/7251 1,203 (H)  90 - 386 mg/dL Final  . IgM (Immunoglobulin M), Srm 01/18/2024 91  20 - 172 mg/dL Final  . Total Protein ELP 01/18/2024 5.9 (L)  6.0 - 8.5 g/dL Corrected  . Albumin  SerPl Elph-Mcnc 01/18/2024 2.8 (L)  2.9 - 4.4 g/dL Corrected  . Alpha 1 01/18/2024 0.3  0.0 - 0.4 g/dL Corrected  . Alpha2 Glob SerPl Elph-Mcnc 01/18/2024 0.6  0.4 -  1.0 g/dL Corrected  . B-Globulin SerPl Elph-Mcnc 01/18/2024 1.7 (H)  0.7 - 1.3 g/dL Corrected  . Gamma Glob SerPl Elph-Mcnc 01/18/2024 0.4  0.4 - 1.8 g/dL Corrected  . M Protein SerPl Elph-Mcnc 01/18/2024 0.9 (H)  Not Observed g/dL Corrected  . Globulin, Total 01/18/2024 3.1  2.2 - 3.9 g/dL Corrected  . Albumin /Glob SerPl 01/18/2024 1.0  0.7 - 1.7 Corrected  . IFE 1 01/18/2024 Comment (A)   Corrected  . Please Note 01/18/2024 Comment   Corrected  .  Kappa free light chain 01/18/2024 1,099.9 (H)  3.3 - 19.4 mg/L Final  . Lambda free light chains 01/18/2024 33.2 (H)  5.7 - 26.3 mg/L Final  . Kappa, lambda light chain ratio 01/18/2024 33.13 (H)  0.26 - 1.65 Final  No results displayed because visit has over 200 results.    Admission on 01/05/2024, Discharged on 01/05/2024  Component Date Value Ref Range Status  . WBC 01/05/2024 6.5  4.0 - 10.5 K/uL Final  . RBC 01/05/2024 3.17 (L)  4.22 - 5.81 MIL/uL Final  . Hemoglobin 01/05/2024 10.8 (L)  13.0 - 17.0 g/dL Final  . HCT 16/09/9603 32.8 (L)  39.0 - 52.0 % Final  . MCV 01/05/2024 103.5 (H)  80.0 - 100.0 fL Final  . MCH 01/05/2024 34.1 (H)  26.0 - 34.0 pg Final  . MCHC 01/05/2024 32.9  30.0 - 36.0 g/dL Final  . RDW 54/08/8118 14.2  11.5 - 15.5 % Final  . Platelets 01/05/2024 66 (L)  150 - 400 K/uL Final  . nRBC 01/05/2024 0.0  0.0 - 0.2 % Final  . Neutrophils Relative % 01/05/2024 54  % Final  . Neutro Abs 01/05/2024 3.6  1.7 - 7.7 K/uL Final  . Lymphocytes Relative 01/05/2024 22  % Final  . Lymphs Abs 01/05/2024 1.4  0.7 - 4.0 K/uL Final  . Monocytes Relative 01/05/2024 12  % Final  . Monocytes Absolute 01/05/2024 0.8  0.1 - 1.0 K/uL Final  . Eosinophils Relative 01/05/2024 10  % Final  . Eosinophils Absolute 01/05/2024 0.6 (H)  0.0 - 0.5 K/uL Final  . Basophils Relative 01/05/2024 1  % Final  . Basophils Absolute 01/05/2024 0.1  0.0 - 0.1 K/uL Final  . Immature Granulocytes 01/05/2024 1  % Final  . Abs Immature Granulocytes 01/05/2024 0.05  0.00 - 0.07 K/uL Final  . Sodium 01/05/2024 142  135 - 145 mmol/L Final  . Potassium 01/05/2024 3.9  3.5 - 5.1 mmol/L Final  . Chloride 01/05/2024 107  98 - 111 mmol/L Final  . CO2 01/05/2024 24  22 - 32 mmol/L Final  . Glucose, Bld 01/05/2024 151 (H)  70 - 99 mg/dL Final  . BUN 14/78/2956 57 (H)  6 - 20 mg/dL Final  . Creatinine, Ser 01/05/2024 6.95 (H)  0.61 - 1.24 mg/dL Final  . Calcium  01/05/2024 8.9  8.9 - 10.3 mg/dL Final  . Total  Protein 01/05/2024 5.9 (L)  6.5 - 8.1 g/dL Final  . Albumin  01/05/2024 2.4 (L)  3.5 - 5.0 g/dL Final  . AST 21/30/8657 27  15 - 41 U/L Final  . ALT 01/05/2024 26  0 - 44 U/L Final  . Alkaline Phosphatase 01/05/2024 140 (H)  38 - 126 U/L Final  . Total Bilirubin 01/05/2024 0.7  0.0 - 1.2 mg/dL Final  . GFR, Estimated 01/05/2024 9 (L)  >60 mL/min Final  . Anion gap 01/05/2024 11  5 - 15 Final  . Magnesium  01/05/2024 2.4  1.7 -  2.4 mg/dL Final  . B Natriuretic Peptide 01/05/2024 430.6 (H)  0.0 - 100.0 pg/mL Final  . SARS Coronavirus 2 by RT PCR 01/05/2024 NEGATIVE  NEGATIVE Final  . Influenza A by PCR 01/05/2024 NEGATIVE  NEGATIVE Final  . Influenza B by PCR 01/05/2024 NEGATIVE  NEGATIVE Final  . Resp Syncytial Virus by PCR 01/05/2024 NEGATIVE  NEGATIVE Final  Scanned Document on 12/28/2023  Component Date Value Ref Range Status  . PTH 12/23/2023 177   Final  . Calcium  12/23/2023 9.1   Final  . EGFR 12/23/2023 10.0   Final  Office Visit on 12/14/2023  Component Date Value Ref Range Status  . WBC 12/14/2023 6.3  4.0 - 10.5 K/uL Final  . RBC 12/14/2023 3.69 (L)  4.22 - 5.81 Mil/uL Final  . Hemoglobin 12/14/2023 12.6 (L)  13.0 - 17.0 g/dL Final  . HCT 29/56/2130 37.0 (L)  39.0 - 52.0 % Final  . MCV 12/14/2023 100.3 (H)  78.0 - 100.0 fl Final  . MCHC 12/14/2023 34.0  30.0 - 36.0 g/dL Final  . RDW 86/57/8469 14.3  11.5 - 15.5 % Final  . Platelets 12/14/2023 70.0 (L)  150.0 - 400.0 K/uL Final  . Neutrophils Relative % 12/14/2023 67.3  43.0 - 77.0 % Final  . Lymphocytes Relative 12/14/2023 16.1  12.0 - 46.0 % Final  . Monocytes Relative 12/14/2023 11.9  3.0 - 12.0 % Final  . Eosinophils Relative 12/14/2023 3.9  0.0 - 5.0 % Final  . Basophils Relative 12/14/2023 0.8  0.0 - 3.0 % Final  . Neutro Abs 12/14/2023 4.2  1.4 - 7.7 K/uL Final  . Lymphs Abs 12/14/2023 1.0  0.7 - 4.0 K/uL Final  . Monocytes Absolute 12/14/2023 0.8  0.1 - 1.0 K/uL Final  . Eosinophils Absolute 12/14/2023 0.2  0.0 -  0.7 K/uL Final  . Basophils Absolute 12/14/2023 0.0  0.0 - 0.1 K/uL Final  . Sodium 12/14/2023 143  135 - 145 mEq/L Final  . Potassium 12/14/2023 3.5  3.5 - 5.1 mEq/L Final  . Chloride 12/14/2023 107  96 - 112 mEq/L Final  . CO2 12/14/2023 26  19 - 32 mEq/L Final  . Glucose, Bld 12/14/2023 110 (H)  70 - 99 mg/dL Final  . BUN 62/95/2841 45 (H)  6 - 23 mg/dL Final  . Creatinine, Ser 12/14/2023 5.98 (HH)  0.40 - 1.50 mg/dL Final  . Total Bilirubin 12/14/2023 0.9  0.2 - 1.2 mg/dL Final  . Alkaline Phosphatase 12/14/2023 171 (H)  39 - 117 U/L Final  . AST 12/14/2023 37  0 - 37 U/L Final  . ALT 12/14/2023 39  0 - 53 U/L Final  . Total Protein 12/14/2023 6.6  6.0 - 8.3 g/dL Final  . Albumin  12/14/2023 3.3 (L)  3.5 - 5.2 g/dL Final  . GFR 32/44/0102 10.23 (LL)  >60.00 mL/min Final  . Calcium  12/14/2023 9.3  8.4 - 10.5 mg/dL Final  . Color, Urine 72/53/6644 YELLOW  Yellow;Lt. Yellow;Straw;Dark Yellow;Amber;Green;Red;Brown Final  . APPearance 12/14/2023 CLEAR  Clear;Turbid;Slightly Cloudy;Cloudy Final  . Specific Gravity, Urine 12/14/2023 1.020  1.000 - 1.030 Final  . pH 12/14/2023 6.5  5.0 - 8.0 Final  . Total Protein, Urine 12/14/2023 >=300 (A)  Negative Final  . Urine Glucose 12/14/2023 NEGATIVE  Negative Final  . Ketones, ur 12/14/2023 NEGATIVE  Negative Final  . Bilirubin Urine 12/14/2023 NEGATIVE  Negative Final  . Hgb urine dipstick 12/14/2023 SMALL (A)  Negative Final  . Urobilinogen, UA 12/14/2023 0.2  0.0 -  1.0 Final  . Leukocytes,Ua 12/14/2023 NEGATIVE  Negative Final  . Nitrite 12/14/2023 NEGATIVE  Negative Final  . WBC, UA 12/14/2023 0-2/hpf  0-2/hpf Final  . RBC / HPF 12/14/2023 3-6/hpf (A)  0-2/hpf Final  . Squamous Epithelial / HPF 12/14/2023 Rare(0-4/hpf)  Rare(0-4/hpf) Final  Office Visit on 12/01/2023  Component Date Value Ref Range Status  . WBC 12/01/2023 5.8  4.0 - 10.5 K/uL Final  . RBC 12/01/2023 3.63 (L)  4.22 - 5.81 Mil/uL Final  . Hemoglobin 12/01/2023 12.6 (L)   13.0 - 17.0 g/dL Final  . HCT 29/56/2130 36.1 (L)  39.0 - 52.0 % Final  . MCV 12/01/2023 99.6  78.0 - 100.0 fl Final  . MCHC 12/01/2023 34.8  30.0 - 36.0 g/dL Final  . RDW 86/57/8469 14.9  11.5 - 15.5 % Final  . Platelets 12/01/2023 67.0 (L)  150.0 - 400.0 K/uL Final  . Neutrophils Relative % 12/01/2023 56.2  43.0 - 77.0 % Final  . Lymphocytes Relative 12/01/2023 23.7  12.0 - 46.0 % Final  . Monocytes Relative 12/01/2023 13.0 (H)  3.0 - 12.0 % Final  . Eosinophils Relative 12/01/2023 6.2 (H)  0.0 - 5.0 % Final  . Basophils Relative 12/01/2023 0.9  0.0 - 3.0 % Final  . Neutro Abs 12/01/2023 3.2  1.4 - 7.7 K/uL Final  . Lymphs Abs 12/01/2023 1.4  0.7 - 4.0 K/uL Final  . Monocytes Absolute 12/01/2023 0.8  0.1 - 1.0 K/uL Final  . Eosinophils Absolute 12/01/2023 0.4  0.0 - 0.7 K/uL Final  . Basophils Absolute 12/01/2023 0.0  0.0 - 0.1 K/uL Final  . Sodium 12/01/2023 140  135 - 145 mEq/L Final  . Potassium 12/01/2023 3.5  3.5 - 5.1 mEq/L Final  . Chloride 12/01/2023 104  96 - 112 mEq/L Final  . CO2 12/01/2023 25  19 - 32 mEq/L Final  . Glucose, Bld 12/01/2023 96  70 - 99 mg/dL Final  . BUN 62/95/2841 50 (H)  6 - 23 mg/dL Final  . Creatinine, Ser 12/01/2023 5.78 (HH)  0.40 - 1.50 mg/dL Final  . Total Bilirubin 12/01/2023 0.8  0.2 - 1.2 mg/dL Final  . Alkaline Phosphatase 12/01/2023 178 (H)  39 - 117 U/L Final  . AST 12/01/2023 34  0 - 37 U/L Final  . ALT 12/01/2023 44  0 - 53 U/L Final  . Total Protein 12/01/2023 6.7  6.0 - 8.3 g/dL Final  . Albumin  12/01/2023 3.5  3.5 - 5.2 g/dL Final  . GFR 32/44/0102 10.66 (LL)  >60.00 mL/min Final  . Calcium  12/01/2023 9.3  8.4 - 10.5 mg/dL Final  . TSH W/REFLEX TO FT4 12/01/2023 3.83  0.40 - 4.50 mIU/L Final  . Color, Urine 12/01/2023 YELLOW  Yellow;Lt. Yellow;Straw;Dark Yellow;Amber;Green;Red;Brown Final  . APPearance 12/01/2023 CLEAR  Clear;Turbid;Slightly Cloudy;Cloudy Final  . Specific Gravity, Urine 12/01/2023 1.015  1.000 - 1.030 Final  . pH  12/01/2023 6.0  5.0 - 8.0 Final  . Total Protein, Urine 12/01/2023 100 (A)  Negative Final  . Urine Glucose 12/01/2023 250 (A)  Negative Final  . Ketones, ur 12/01/2023 NEGATIVE  Negative Final  . Bilirubin Urine 12/01/2023 NEGATIVE  Negative Final  . Hgb urine dipstick 12/01/2023 SMALL (A)  Negative Final  . Urobilinogen, UA 12/01/2023 0.2  0.0 - 1.0 Final  . Leukocytes,Ua 12/01/2023 NEGATIVE  Negative Final  . Nitrite 12/01/2023 NEGATIVE  Negative Final  . WBC, UA 12/01/2023 0-2/hpf  0-2/hpf Final  . RBC / HPF 12/01/2023 0-2/hpf  0-2/hpf Final  . Squamous Epithelial / HPF 12/01/2023 Rare(0-4/hpf)  Rare(0-4/hpf) Final  . POC Glucose 12/01/2023 116 (A)  70 - 99 mg/dl Final  Office Visit on 11/09/2023  Component Date Value Ref Range Status  . WBC 11/09/2023 4.8  4.0 - 10.5 K/uL Final  . RBC 11/09/2023 3.42 (L)  4.22 - 5.81 Mil/uL Final  . Hemoglobin 11/09/2023 11.7 (L)  13.0 - 17.0 g/dL Final  . HCT 40/98/1191 34.3 (L)  39.0 - 52.0 % Final  . MCV 11/09/2023 100.1 (H)  78.0 - 100.0 fl Final  . MCHC 11/09/2023 34.2  30.0 - 36.0 g/dL Final  . RDW 47/82/9562 15.3  11.5 - 15.5 % Final  . Platelets 11/09/2023 66.0 (L)  150.0 - 400.0 K/uL Final  . Neutrophils Relative % 11/09/2023 56.8  43.0 - 77.0 % Final  . Lymphocytes Relative 11/09/2023 25.4  12.0 - 46.0 % Final  . Monocytes Relative 11/09/2023 11.2  3.0 - 12.0 % Final  . Eosinophils Relative 11/09/2023 5.5 (H)  0.0 - 5.0 % Final  . Basophils Relative 11/09/2023 1.1  0.0 - 3.0 % Final  . Neutro Abs 11/09/2023 2.7  1.4 - 7.7 K/uL Final  . Lymphs Abs 11/09/2023 1.2  0.7 - 4.0 K/uL Final  . Monocytes Absolute 11/09/2023 0.5  0.1 - 1.0 K/uL Final  . Eosinophils Absolute 11/09/2023 0.3  0.0 - 0.7 K/uL Final  . Basophils Absolute 11/09/2023 0.1  0.0 - 0.1 K/uL Final  . Sodium 11/09/2023 141  135 - 145 mEq/L Final  . Potassium 11/09/2023 3.9  3.5 - 5.1 mEq/L Final  . Chloride 11/09/2023 107  96 - 112 mEq/L Final  . CO2 11/09/2023 23  19 - 32  mEq/L Final  . Glucose, Bld 11/09/2023 159 (H)  70 - 99 mg/dL Final  . BUN 13/07/6577 66 (H)  6 - 23 mg/dL Final  . Creatinine, Ser 11/09/2023 5.04 (HH)  0.40 - 1.50 mg/dL Final  . Total Bilirubin 11/09/2023 0.7  0.2 - 1.2 mg/dL Final  . Alkaline Phosphatase 11/09/2023 199 (H)  39 - 117 U/L Final  . AST 11/09/2023 35  0 - 37 U/L Final  . ALT 11/09/2023 45  0 - 53 U/L Final  . Total Protein 11/09/2023 6.8  6.0 - 8.3 g/dL Final  . Albumin  11/09/2023 3.3 (L)  3.5 - 5.2 g/dL Final  . GFR 46/96/2952 12.57 (LL)  >60.00 mL/min Final  . Calcium  11/09/2023 8.9  8.4 - 10.5 mg/dL Final  . Creatinine, Urine 11/09/2023 42  20 - 320 mg/dL Final  . Protein/Creat Ratio 11/09/2023 2,762 (H)  25 - 148 mg/g creat Final  . Protein/Creatinine Ratio 11/09/2023 2.762 (H)  0.025 - 0.148 mg/mg creat Final  . Total Protein, Urine 11/09/2023 116 (H)  5 - 25 mg/dL Final  . Color, Urine 84/13/2440 YELLOW  Yellow;Lt. Yellow;Straw;Dark Yellow;Amber;Green;Red;Brown Final  . APPearance 11/09/2023 CLEAR  Clear;Turbid;Slightly Cloudy;Cloudy Final  . Specific Gravity, Urine 11/09/2023 1.015  1.000 - 1.030 Final  . pH 11/09/2023 6.0  5.0 - 8.0 Final  . Total Protein, Urine 11/09/2023 100 (A)  Negative Final  . Urine Glucose 11/09/2023 >=1000 (A)  Negative Final  . Ketones, ur 11/09/2023 NEGATIVE  Negative Final  . Bilirubin Urine 11/09/2023 NEGATIVE  Negative Final  . Hgb urine dipstick 11/09/2023 SMALL (A)  Negative Final  . Urobilinogen, UA 11/09/2023 0.2  0.0 - 1.0 Final  . Leukocytes,Ua 11/09/2023 NEGATIVE  Negative Final  . Nitrite 11/09/2023 NEGATIVE  Negative  Final  . WBC, UA 11/09/2023 0-2/hpf  0-2/hpf Final  . RBC / HPF 11/09/2023 0-2/hpf  0-2/hpf Final  . Mucus, UA 11/09/2023 Presence of (A)  None Final  . Squamous Epithelial / HPF 11/09/2023 Rare(0-4/hpf)  Rare(0-4/hpf) Final  . Amorphous 11/09/2023 Present (A)  None;Present Final  There may be more visits with results that are not included.  No image  results found. VAS US  DUPLEX DIALYSIS ACCESS (AVF, AVG) Result Date: 04/18/2024 DIALYSIS ACCESS Patient Name:  Tameron [name] [facility]  Date of Exam:   04/18/2024 Medical Rec #: [id number]            Accession #:    [id number] Date of Birth: 24-Feb-1971            Patient Gender: M Patient Age:   35 years Exam Location:  [address] Procedure:      VAS US  DUPLEX DIALYSIS ACCESS (AVF, AVG) Referring Phys: JOSHUA ROBINS --------------------------------------------------------------------------------  Reason for Exam: Routine follow up. Access Site: Left Upper Extremity. Access Type: Radial-cephalic AVF. Comparison Study: No prior study Performing Technologist: Delford Felling MHA, RDMS, RVT, RDCS  Examination Guidelines: A complete evaluation includes B-mode imaging, spectral Doppler, color Doppler, and power Doppler as needed of all accessible portions of each vessel. Unilateral testing is considered an integral part of a complete examination. Limited examinations for reoccurring indications may be performed as noted.  Findings: +--------------------+----------+-----------------+--------+ AVF                 PSV (cm/s)Flow Vol (mL/min)Comments +--------------------+----------+-----------------+--------+ Native artery inflow   187           874                +--------------------+----------+-----------------+--------+ AVF Anastomosis        549                              +--------------------+----------+-----------------+--------+  +------------+---------+------------+----------+-------------------------------+ OUTFLOW VEIN   PSV     Diameter  Depth (cm)           Describe                          (cm/s)      (cm)                                              +------------+---------+------------+----------+-------------------------------+ Prox UA        86        0.69       0.83     competing branch measuring                                                           0.22cm               +------------+---------+------------+----------+-------------------------------+ Mid UA         129       0.67       0.40     competing branch measuring  0.18cm              +------------+---------+------------+----------+-------------------------------+ Dist UA        54        0.84       0.48                                   +------------+---------+------------+----------+-------------------------------+ AC Fossa       82        0.87       0.37     competing branch measuring                                                           0.57cm              +------------+---------+------------+----------+-------------------------------+ Prox Forearm   148       0.85       0.58     competing branch measuring                                                            0.2cm              +------------+---------+------------+----------+-------------------------------+ Mid Forearm    112       0.91       0.46                                   +------------+---------+------------+----------+-------------------------------+ Dist Forearm   206       0.91       0.21                                   +------------+---------+------------+----------+-------------------------------+   Summary: Patent arteriovenous fistula. Volume flow= 874 mL/min. Arteriovenous fistula-Velocities less than 100cm/s noted. Multiple competing branches as noted above; largest measuring 0.57cm at [facility] fossa. *See table(s) above for measurements and observations.  Diagnosing physician: Genny Kid MD Electronically signed by Genny Kid MD on 04/18/2024 at 3:39:42 PM.   --------------------------------------------------------------------------------   Final         04/22/2024    9:21 AM 02/12/2024    1:08 PM 02/05/2024    8:26 AM 11/02/2023   10:55 AM  PHQ 2/9 Scores  PHQ - 2 Score 0 0 0 2  PHQ- 9 Score 0   8    Results DIAGNOSTIC Dialysis: Performed three times a week (05/06/2024)  PATHOLOGY Fistula repair: Vein not adequate, twist corrected (05/06/2024)    Assessment & Plan Obstructive sleep apnea syndrome  Morbid obesity (HCC)   Assessment and Plan Assessment & Plan End-stage renal disease on dialysis   He undergoes dialysis three times a week. A recent fistula revision was necessary due to inadequate vein size and potential complications. Post-procedure swelling and tenderness are expected to resolve. Dialysis speed remains unchanged, and the new fistula will be usable in 4-6 weeks. Monitor fistula healing and swelling, and continue dialysis  as scheduled.  Obesity   He seeks Zepbound  (tirzepatide ) for weight loss, but insurance denied coverage due to the absence of a diabetes diagnosis. Comorbidities such as sleep apnea and fatty liver disease support the use of Zepbound . He struggles to lose weight despite following low-carb and Mediterranean diets and experiences increased appetite post-dialysis. An insurance appeal has been initiated with a comprehensive letter detailing medical necessity and comorbidities. Zepbound  is FDA approved for sleep apnea and weight loss, potentially reducing medication burden and improving outcomes. Consider Wegovy  if Zepbound  is denied, though it has more side effects like nausea and diarrhea. Write an appeal letter to insurance for Zepbound  coverage, coordinate with the pharmacy for prior authorization, and monitor for side effects if medication is approved and initiated.  Obstructive sleep apnea syndrome   This comorbid condition supports the use of Zepbound  for weight loss, contributing to the overall health burden.  Chronic liver failure without hepatic coma   Chronic liver failure with NASH is part of the rationale for Zepbound  weight loss treatment.  Goals of Care   He is considering semi-retirement with an interest in less demanding work like  deep-sea fishing, aligning with personal interests and lifestyle goals. Discussed the potential for disability benefits to support lifestyle changes.  Follow-up   Follow up in one month to assess the progress of the Zepbound  appeal and treatment plan developments. Schedule a follow-up appointment in one month and check with the pharmacy regarding insurance approval for Zepbound .  Recording duration: 41 minutes  has History of umbilical hernia repair; Hypertension; Hyperlipidemia; Thrombocytopenia (HCC); Sinus bradycardia; Atherosclerotic heart disease of native coronary artery without angina pectoris; Atrial flutter with rapid ventricular response (HCC); Morbid obesity (HCC); Other long term (current) drug therapy; History of coronary artery bypass surgery; Obstructive sleep apnea syndrome; Chronic gout of multiple sites; Alkaline phosphatase elevation; Gout; Ectatic aorta (HCC); Gastroesophageal reflux disease without esophagitis; Herpes simplex; Metabolic dysfunction-associated steatohepatitis (MASH); Prediabetes; Statins contraindicated; Hypothyroid; Chronic venous stasis dermatitis of both lower extremities; Nausea and vomiting; Chronic liver failure without hepatic coma (HCC); Splenomegaly; Spondylolisthesis; Bleeding diathesis (HCC); Malaise and fatigue; Allergic rhinitis due to pollen; Amitriptyline  adverse reaction; MGUS (monoclonal gammopathy of unknown significance); 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; Rectal bleeding; Recurrent sinusitis; Asthma, chronic; ESRD (end stage renal disease) (HCC); SVT (supraventricular tachycardia) (HCC); PUD (peptic ulcer disease); Disability affecting daily living; Inflamed internal hemorrhoid; Anemia in chronic kidney disease; Chronic combined systolic (congestive) and diastolic (congestive) heart failure (HCC); Coagulation defect, unspecified (HCC); Dependence on renal dialysis (HCC); Gout due to renal impairment, unspecified site; Hypertensive chronic  kidney disease with stage 5 chronic kidney disease or end stage renal disease (HCC); Mild protein-calorie malnutrition (HCC); Other chronic pain; Other disorders of phosphorus metabolism; Other fluid overload; Iron deficiency anemia, unspecified; Other iron deficiency anemias; Pruritus, unspecified; Pure hypercholesterolemia, unspecified; Secondary hyperparathyroidism of renal origin (HCC); Shortness of breath; and Abnormal bowel habits on their problem list.         Body mass index is 52.11 kg/m.  Lipoprotein(a), Apolipoprotein B (ApoB), and High-sensitivity C-reactive protein (hs-CRP) No results found for: HSCRP, LIPOA Improving Your Cholesterol: Diet: Focus on a Mediterranean-style diet, limit saturated fats and sugars, and increase omega-3 fatty acids (fish, flaxseeds,nuts,extra virgin olive oil, avocados). Exercise: Engage in regular physical activity (aerobic exercises are particularly beneficial for HDL). Weight Management: Maintain a healthy weight. Smoking Cessation: Quitting smoking improves cholesterol levels.         Orders Placed in Encounter:   Lab Orders  No laboratory test(s) ordered today   Imaging Orders  No imaging studies ordered today   Referral Orders  No referral(s) requested today   No orders of the defined types were placed in this encounter.    No orders of the defined types were placed in this encounter.  ED Discharge Orders     None         This document was synthesized by artificial intelligence (Abridge) using HIPAA-compliant recording of the clinical interaction;   We discussed the use of AI scribe software for clinical note transcription with the patient, who gave verbal consent to proceed. additional Info: This encounter employed state-of-the-art, real-time, collaborative documentation. The patient actively reviewed and assisted in updating their electronic medical record on a shared screen, ensuring transparency and facilitating joint  problem-solving for the problem list, overview, and plan. This approach promotes accurate, informed care. The treatment plan was discussed and reviewed in detail, including medication safety, potential side effects, and all patient questions. We confirmed understanding and comfort with the plan. Follow-up instructions were established, including contacting the office for any concerns, returning if symptoms worsen, persist, or new symptoms develop, and precautions for potential emergency department visits.

## 2024-05-14 ENCOUNTER — Other Ambulatory Visit (HOSPITAL_BASED_OUTPATIENT_CLINIC_OR_DEPARTMENT_OTHER): Payer: Self-pay

## 2024-05-14 ENCOUNTER — Encounter: Payer: Self-pay | Admitting: Internal Medicine

## 2024-05-14 ENCOUNTER — Other Ambulatory Visit: Payer: Self-pay | Admitting: Nurse Practitioner

## 2024-05-14 DIAGNOSIS — N186 End stage renal disease: Secondary | ICD-10-CM | POA: Diagnosis not present

## 2024-05-14 DIAGNOSIS — R52 Pain, unspecified: Secondary | ICD-10-CM | POA: Diagnosis not present

## 2024-05-14 DIAGNOSIS — Z992 Dependence on renal dialysis: Secondary | ICD-10-CM | POA: Diagnosis not present

## 2024-05-14 DIAGNOSIS — N2581 Secondary hyperparathyroidism of renal origin: Secondary | ICD-10-CM | POA: Diagnosis not present

## 2024-05-14 DIAGNOSIS — L299 Pruritus, unspecified: Secondary | ICD-10-CM | POA: Diagnosis not present

## 2024-05-14 NOTE — Assessment & Plan Note (Signed)
 He undergoes dialysis three times a week. A recent fistula revision was necessary due to inadequate vein size and potential complications. Post-procedure swelling and tenderness are expected to resolve. Dialysis speed remains unchanged, and the new fistula will be usable in 4-6 weeks. Monitor fistula healing and swelling, and continue dialysis as scheduled.

## 2024-05-14 NOTE — Patient Instructions (Signed)
 It was a pleasure seeing you today! Your health and satisfaction are our top priorities.  Scherrie Curt, MD  Your Providers PCP: Anthon Kins, MD,  [phone]) Referring Provider: Anthon Kins, MD,  [phone]) Care Team Provider: Jann Melody, MD,  [phone]) Care Team Provider: Karren Paddock, RN Care Team Provider: Deloria Fetch, MD,  [phone]) Care Team Provider: Lindle Rhea, MD Care Team Provider: Levada Raymond Care Team Provider: Melodie Spry, MD,  [phone])     NEXT STEPS: [x]  Early Intervention: Schedule sooner appointment, call our on-call services, or go to emergency room if there is any significant Increase in pain or discomfort New or worsening symptoms Sudden or severe changes in your health [x]  Flexible Follow-Up: We recommend a Return in about 1 month (around 06/12/2024) for chronic disease monitoring and management. for optimal routine care. This allows for progress monitoring and treatment adjustments. [x]  Preventive Care: Schedule your annual preventive care visit! It's typically covered by insurance and helps identify potential health issues early. [x]  Lab & X-ray Appointments: Incomplete tests scheduled today, or call to schedule. X-rays: [facility] (M-F, 8:30am-noon or 1pm-5pm). [x]  Medical Information Release: Sign a release form at front desk to obtain relevant medical information we don't have.  MAKING THE MOST OF OUR FOCUSED 20 MINUTE APPOINTMENTS: [x]   Clearly state your top concerns at the beginning of the visit to focus our discussion [x]   If you anticipate you will need more time, please inform the front desk during scheduling - we can book multiple appointments in the same week. [x]   If you have transportation problems- use our convenient video appointments or ask about transportation support. [x]   We can get down to business faster if you use MyChart to update information before the  visit and submit non-urgent questions before your visit. Thank you for taking the time to provide details through MyChart.  Let our nurse know and she can import this information into your encounter documents.  Arrival and Wait Times: [x]   Arriving on time ensures that everyone receives prompt attention. [x]   Early morning (8a) and afternoon (1p) appointments tend to have shortest wait times. [x]   Unfortunately, we cannot delay appointments for late arrivals or hold slots during phone calls.  Getting Answers and Following Up [x]   Simple Questions & Concerns: For quick questions or basic follow-up after your visit, reach us  at [phone] or MyChart messaging. [x]   Complex Concerns: If your concern is more complex, scheduling an appointment might be best. Discuss this with the staff to find the most suitable option. [x]   Lab & Imaging Results: We'll contact you directly if results are abnormal or you don't use MyChart. Most normal results will be on MyChart within 2-3 business days, with a review message from Dr. Boston Byers. Haven't heard back in 2 weeks? Need results sooner? Contact us  at [phone]. [x]   Referrals: Our referral coordinator will manage specialist referrals. The specialist's office should contact you within 2 weeks to schedule an appointment. Call us  if you haven't heard from them after 2 weeks.  Staying Connected [x]   MyChart: Activate your MyChart for the fastest way to access results and message us . See the last page of this paperwork for instructions on how to activate.  Bring to Your Next Appointment [x]   Medications: Please bring all your medication bottles to your next appointment to ensure we have an accurate record of your prescriptions. [x]   Health Diaries: If you're monitoring any  health conditions at home, keeping a diary of your readings can be very helpful for discussions at your next appointment.  Billing [x]   X-ray & Lab Orders: These are billed by separate  companies. Contact the invoicing company directly for questions or concerns. [x]   Visit Charges: Discuss any billing inquiries with our administrative services team.  Your Satisfaction Matters [x]   Share Your Experience: We strive for your satisfaction! If you have any complaints, or preferably compliments, please let Dr. Boston Byers know directly or contact our Practice Administrators, Olinda Bertrand or [employer], by asking at the front desk.   Reviewing Your Records [x]   Review this early draft of your clinical encounter notes below and the final encounter summary tomorrow on MyChart after its been completed.  All orders placed so far are visible here: Obstructive sleep apnea syndrome -     Tirzepatide -Weight Management; Inject 2.5 mg into the skin once a week.  Dispense: 2 mL; Refill: 11  Morbid obesity (HCC) -     Tirzepatide -Weight Management; Inject 2.5 mg into the skin once a week.  Dispense: 2 mL; Refill: 11

## 2024-05-14 NOTE — Assessment & Plan Note (Signed)
 This comorbid condition supports the use of Zepbound  for weight loss, contributing to the overall health burden.

## 2024-05-14 NOTE — Assessment & Plan Note (Signed)
 He seeks Zepbound  (tirzepatide ) for weight loss, but insurance denied coverage due to the absence of a diabetes diagnosis. Comorbidities such as sleep apnea and fatty liver disease support the use of Zepbound . He struggles to lose weight despite following low-carb and Mediterranean diets and experiences increased appetite post-dialysis. An insurance appeal has been initiated with a comprehensive letter detailing medical necessity and comorbidities. Zepbound  is FDA approved for sleep apnea and weight loss, potentially reducing medication burden and improving outcomes. Consider Wegovy  if Zepbound  is denied, though it has more side effects like nausea and diarrhea. Write an appeal letter to insurance for Zepbound  coverage, coordinate with the pharmacy for prior authorization, and monitor for side effects if medication is approved and initiated.

## 2024-05-16 ENCOUNTER — Other Ambulatory Visit (HOSPITAL_BASED_OUTPATIENT_CLINIC_OR_DEPARTMENT_OTHER): Payer: Self-pay

## 2024-05-17 DIAGNOSIS — N2581 Secondary hyperparathyroidism of renal origin: Secondary | ICD-10-CM | POA: Diagnosis not present

## 2024-05-17 DIAGNOSIS — L299 Pruritus, unspecified: Secondary | ICD-10-CM | POA: Diagnosis not present

## 2024-05-17 DIAGNOSIS — R52 Pain, unspecified: Secondary | ICD-10-CM | POA: Diagnosis not present

## 2024-05-17 DIAGNOSIS — Z992 Dependence on renal dialysis: Secondary | ICD-10-CM | POA: Diagnosis not present

## 2024-05-17 DIAGNOSIS — N186 End stage renal disease: Secondary | ICD-10-CM | POA: Diagnosis not present

## 2024-05-18 ENCOUNTER — Other Ambulatory Visit: Payer: Self-pay | Admitting: Internal Medicine

## 2024-05-18 DIAGNOSIS — L219 Seborrheic dermatitis, unspecified: Secondary | ICD-10-CM

## 2024-05-19 ENCOUNTER — Other Ambulatory Visit: Payer: Self-pay | Admitting: Internal Medicine

## 2024-05-19 DIAGNOSIS — L299 Pruritus, unspecified: Secondary | ICD-10-CM | POA: Diagnosis not present

## 2024-05-19 DIAGNOSIS — N2581 Secondary hyperparathyroidism of renal origin: Secondary | ICD-10-CM | POA: Diagnosis not present

## 2024-05-19 DIAGNOSIS — N186 End stage renal disease: Secondary | ICD-10-CM | POA: Diagnosis not present

## 2024-05-19 DIAGNOSIS — G4733 Obstructive sleep apnea (adult) (pediatric): Secondary | ICD-10-CM

## 2024-05-19 DIAGNOSIS — R52 Pain, unspecified: Secondary | ICD-10-CM | POA: Diagnosis not present

## 2024-05-19 DIAGNOSIS — Z992 Dependence on renal dialysis: Secondary | ICD-10-CM | POA: Diagnosis not present

## 2024-05-21 DIAGNOSIS — L299 Pruritus, unspecified: Secondary | ICD-10-CM | POA: Diagnosis not present

## 2024-05-21 DIAGNOSIS — R52 Pain, unspecified: Secondary | ICD-10-CM | POA: Diagnosis not present

## 2024-05-21 DIAGNOSIS — Z992 Dependence on renal dialysis: Secondary | ICD-10-CM | POA: Diagnosis not present

## 2024-05-21 DIAGNOSIS — N186 End stage renal disease: Secondary | ICD-10-CM | POA: Diagnosis not present

## 2024-05-21 DIAGNOSIS — N2581 Secondary hyperparathyroidism of renal origin: Secondary | ICD-10-CM | POA: Diagnosis not present

## 2024-05-24 DIAGNOSIS — R52 Pain, unspecified: Secondary | ICD-10-CM | POA: Diagnosis not present

## 2024-05-24 DIAGNOSIS — N186 End stage renal disease: Secondary | ICD-10-CM | POA: Diagnosis not present

## 2024-05-24 DIAGNOSIS — N2581 Secondary hyperparathyroidism of renal origin: Secondary | ICD-10-CM | POA: Diagnosis not present

## 2024-05-24 DIAGNOSIS — L299 Pruritus, unspecified: Secondary | ICD-10-CM | POA: Diagnosis not present

## 2024-05-24 DIAGNOSIS — Z992 Dependence on renal dialysis: Secondary | ICD-10-CM | POA: Diagnosis not present

## 2024-05-26 DIAGNOSIS — R52 Pain, unspecified: Secondary | ICD-10-CM | POA: Diagnosis not present

## 2024-05-26 DIAGNOSIS — Z992 Dependence on renal dialysis: Secondary | ICD-10-CM | POA: Diagnosis not present

## 2024-05-26 DIAGNOSIS — L299 Pruritus, unspecified: Secondary | ICD-10-CM | POA: Diagnosis not present

## 2024-05-26 DIAGNOSIS — N186 End stage renal disease: Secondary | ICD-10-CM | POA: Diagnosis not present

## 2024-05-26 DIAGNOSIS — N2581 Secondary hyperparathyroidism of renal origin: Secondary | ICD-10-CM | POA: Diagnosis not present

## 2024-05-28 DIAGNOSIS — N186 End stage renal disease: Secondary | ICD-10-CM | POA: Diagnosis not present

## 2024-05-28 DIAGNOSIS — R52 Pain, unspecified: Secondary | ICD-10-CM | POA: Diagnosis not present

## 2024-05-28 DIAGNOSIS — L299 Pruritus, unspecified: Secondary | ICD-10-CM | POA: Diagnosis not present

## 2024-05-28 DIAGNOSIS — Z992 Dependence on renal dialysis: Secondary | ICD-10-CM | POA: Diagnosis not present

## 2024-05-28 DIAGNOSIS — N2581 Secondary hyperparathyroidism of renal origin: Secondary | ICD-10-CM | POA: Diagnosis not present

## 2024-05-30 ENCOUNTER — Ambulatory Visit (INDEPENDENT_AMBULATORY_CARE_PROVIDER_SITE_OTHER): Payer: BC Managed Care – PPO | Admitting: Otolaryngology

## 2024-05-30 DIAGNOSIS — N2581 Secondary hyperparathyroidism of renal origin: Secondary | ICD-10-CM | POA: Diagnosis not present

## 2024-05-30 DIAGNOSIS — Z0289 Encounter for other administrative examinations: Secondary | ICD-10-CM | POA: Diagnosis not present

## 2024-05-30 DIAGNOSIS — L299 Pruritus, unspecified: Secondary | ICD-10-CM | POA: Diagnosis not present

## 2024-05-30 DIAGNOSIS — N186 End stage renal disease: Secondary | ICD-10-CM | POA: Diagnosis not present

## 2024-05-30 DIAGNOSIS — I509 Heart failure, unspecified: Secondary | ICD-10-CM | POA: Diagnosis not present

## 2024-05-30 DIAGNOSIS — R52 Pain, unspecified: Secondary | ICD-10-CM | POA: Diagnosis not present

## 2024-05-30 DIAGNOSIS — Z992 Dependence on renal dialysis: Secondary | ICD-10-CM | POA: Diagnosis not present

## 2024-06-01 ENCOUNTER — Ambulatory Visit: Payer: Self-pay

## 2024-06-01 DIAGNOSIS — L299 Pruritus, unspecified: Secondary | ICD-10-CM | POA: Diagnosis not present

## 2024-06-01 DIAGNOSIS — N186 End stage renal disease: Secondary | ICD-10-CM | POA: Diagnosis not present

## 2024-06-01 DIAGNOSIS — Z992 Dependence on renal dialysis: Secondary | ICD-10-CM | POA: Diagnosis not present

## 2024-06-01 DIAGNOSIS — N2581 Secondary hyperparathyroidism of renal origin: Secondary | ICD-10-CM | POA: Diagnosis not present

## 2024-06-01 DIAGNOSIS — R52 Pain, unspecified: Secondary | ICD-10-CM | POA: Diagnosis not present

## 2024-06-03 DIAGNOSIS — N2581 Secondary hyperparathyroidism of renal origin: Secondary | ICD-10-CM | POA: Diagnosis not present

## 2024-06-03 DIAGNOSIS — N186 End stage renal disease: Secondary | ICD-10-CM | POA: Diagnosis not present

## 2024-06-03 DIAGNOSIS — R52 Pain, unspecified: Secondary | ICD-10-CM | POA: Diagnosis not present

## 2024-06-03 DIAGNOSIS — Z992 Dependence on renal dialysis: Secondary | ICD-10-CM | POA: Diagnosis not present

## 2024-06-03 DIAGNOSIS — L299 Pruritus, unspecified: Secondary | ICD-10-CM | POA: Diagnosis not present

## 2024-06-06 DIAGNOSIS — N2581 Secondary hyperparathyroidism of renal origin: Secondary | ICD-10-CM | POA: Diagnosis not present

## 2024-06-06 DIAGNOSIS — Z992 Dependence on renal dialysis: Secondary | ICD-10-CM | POA: Diagnosis not present

## 2024-06-06 DIAGNOSIS — N186 End stage renal disease: Secondary | ICD-10-CM | POA: Diagnosis not present

## 2024-06-06 DIAGNOSIS — R52 Pain, unspecified: Secondary | ICD-10-CM | POA: Diagnosis not present

## 2024-06-06 DIAGNOSIS — L299 Pruritus, unspecified: Secondary | ICD-10-CM | POA: Diagnosis not present

## 2024-06-07 ENCOUNTER — Ambulatory Visit: Payer: Self-pay | Attending: Vascular Surgery | Admitting: Physician Assistant

## 2024-06-07 VITALS — BP 113/50 | HR 64 | Temp 98.6°F | Resp 18 | Ht 71.0 in | Wt 367.1 lb

## 2024-06-07 DIAGNOSIS — N186 End stage renal disease: Secondary | ICD-10-CM

## 2024-06-07 NOTE — Progress Notes (Signed)
 POST OPERATIVE OFFICE NOTE    CC:  F/u for surgery  HPI:  Sean [name] is a 53 y.o. male who is here for wound check.  He recently underwent left radiocephalic AV fistula superficialization on 05/06/2024 by Dr. Sheree.  His fistula was first created on 01/11/2024 by Dr. Lanis.  He returns today for follow-up.  He says that he is doing well.  Most of his incisions have healed well aside from 1 area of scabbing.  He says this area of scabbing has gotten a little bit smaller since surgery.  He denies any redness, tenderness, or drainage from his incisions.  He denies any fevers or chills.  He denies any symptoms of steal in the left hand.   He currently dialyzes via right IJ TDC.  Allergies  Allergen Reactions   Glucophage  [Metformin ] Other (See Comments)    Pt told by his nephrologist not to take this medication due to his kidney function.   Vibra -Tab [Doxycycline ] Shortness Of Breath   Chlorhexidine  Itching   Chlorhexidine  Gluconate Itching and Other (See Comments)    burning   Augmentin [Amoxicillin-Pot Clavulanate] Itching   Imdur  [Isosorbide  Dinitrate] Diarrhea   Tape Rash   Valacyclovir  Itching    Current Outpatient Medications  Medication Sig Dispense Refill   allopurinol  (ZYLOPRIM ) 100 MG tablet TAKE 1 TABLET BY MOUTH EVERY DAY 90 tablet 3   amitriptyline  (ELAVIL ) 25 MG tablet TAKE 1 TABLET BY MOUTH EVERYDAY AT BEDTIME 30 tablet 0   azelastine  (ASTELIN ) 0.1 % nasal spray Place 2 sprays into both nostrils 2 (two) times daily. Use in each nostril as directed 30 mL 12   B Complex-C-Folic Acid  (DIALYVITE TABLET) TABS Take 1 tablet by mouth daily.     Cholecalciferol (VITAMIN D3) 125 MCG (5000 UT) CAPS Take 1 capsule (5,000 Units total) by mouth daily at 12 noon. 90 capsule 1   Cyanocobalamin  (VITAMIN B-12) 5000 MCG SUBL Place 5,000 mcg under the tongue daily.     diltiazem  (CARDIZEM  SR) 120 MG 12 hr capsule Take 120 mg by mouth 2 (two) times daily.     ELIQUIS  5 MG TABS tablet  TAKE 1 TABLET(5 MG) BY MOUTH TWICE DAILY 180 tablet 1   Evolocumab  (REPATHA ) 140 MG/ML SOSY Inject 140 mg into the skin every 14 (fourteen) days.     iron sucrose in sodium chloride  0.9 % 100 mL Iron Sucrose (Venofer)     ketoconazole  (NIZORAL ) 2 % cream APPLY TO AFFECTED AREA TWICE A DAY 60 g 0   lanthanum (FOSRENOL) 1000 MG chewable tablet Chew 1,000 mg by mouth 3 (three) times daily with meals.     Methoxy PEG-Epoetin Beta (MIRCERA IJ) 75 mcg.     metoprolol  succinate (TOPROL -XL) 25 MG 24 hr tablet Take 1 tablet (25 mg total) by mouth daily. 90 tablet 3   montelukast  (SINGULAIR ) 10 MG tablet TAKE 1 TABLET BY MOUTH EVERY DAY 90 tablet 1   nitroGLYCERIN  (NITROSTAT ) 0.4 MG SL tablet Place 1 tablet (0.4 mg total) under the tongue every 5 (five) minutes as needed for chest pain. 25 tablet 6   ondansetron  (ZOFRAN -ODT) 4 MG disintegrating tablet Take 1 tablet (4 mg total) by mouth every 8 (eight) hours as needed for nausea or vomiting. 30 tablet 2   oxyCODONE -acetaminophen  (PERCOCET) 5-325 MG tablet Take 1 tablet by mouth every 6 (six) hours as needed for severe pain (pain score 7-10). 8 tablet 0   pantoprazole  (PROTONIX ) 40 MG tablet TAKE 1 TABLET BY MOUTH TWICE  A DAY 180 tablet 3   sucralfate  (CARAFATE ) 1 g tablet Take 1 tablet (1 g total) by mouth at bedtime. (Patient taking differently: Take 1 g by mouth at bedtime as needed (Stomach pain).) 30 tablet 1   tirzepatide  (ZEPBOUND ) 2.5 MG/0.5ML Pen INJECT 2.5MG  UNDER THE SKIN ONCE A WEEK 2 mL 2   No current facility-administered medications for this visit.   Facility-Administered Medications Ordered in Other Visits  Medication Dose Route Frequency Provider Last Rate Last Admin   technetium tetrofosmin  (TC-MYOVIEW ) injection 29.8 millicurie  29.8 millicurie Intravenous Once PRN Hilty, Vinie BROCKS, MD         ROS:  See HPI  Physical Exam:   Incision: Well-healed proximal and distal left forearm incisions.  Mid left forearm incision nearly healed  with small area of dry scabbing Extremities: Palpable left radial pulse.  Left radiocephalic fistula with good thrill Neuro: Intact motor and sensation of left hand     Assessment/Plan:  This is a 53 y.o. male who is here for wound check  - The patient recently underwent superficialization of a left radiocephalic AV fistula on 05/06/2024 - The proximal and distal left forearm incisions are well-healed without signs of infection.  His mid forearm incision is nearly healed with a small area of scabbing.  There is no active drainage.  His fistula is directly under this incision - He denies any symptoms of steal.  He has a palpable left radial pulse -On exam his left radiocephalic fistula has a great thrill throughout the forearm -I would like to ensure that the patient's scabbing on the left forearm completely resolves prior to use of the fistula, since this scabbing lies directly on top of the fistula.  He can return to clinic in 2 weeks for repeat wound check.  If his scabbing has completely healed, he can be cleared for use of the fistula   [name], PA-C [facility] [phone]   Clinic MD: Miguel

## 2024-06-08 DIAGNOSIS — L299 Pruritus, unspecified: Secondary | ICD-10-CM | POA: Diagnosis not present

## 2024-06-08 DIAGNOSIS — Z992 Dependence on renal dialysis: Secondary | ICD-10-CM | POA: Diagnosis not present

## 2024-06-08 DIAGNOSIS — N2581 Secondary hyperparathyroidism of renal origin: Secondary | ICD-10-CM | POA: Diagnosis not present

## 2024-06-08 DIAGNOSIS — N186 End stage renal disease: Secondary | ICD-10-CM | POA: Diagnosis not present

## 2024-06-08 DIAGNOSIS — R52 Pain, unspecified: Secondary | ICD-10-CM | POA: Diagnosis not present

## 2024-06-10 DIAGNOSIS — L299 Pruritus, unspecified: Secondary | ICD-10-CM | POA: Diagnosis not present

## 2024-06-10 DIAGNOSIS — N2581 Secondary hyperparathyroidism of renal origin: Secondary | ICD-10-CM | POA: Diagnosis not present

## 2024-06-10 DIAGNOSIS — N186 End stage renal disease: Secondary | ICD-10-CM | POA: Diagnosis not present

## 2024-06-10 DIAGNOSIS — Z992 Dependence on renal dialysis: Secondary | ICD-10-CM | POA: Diagnosis not present

## 2024-06-10 DIAGNOSIS — R52 Pain, unspecified: Secondary | ICD-10-CM | POA: Diagnosis not present

## 2024-06-13 DIAGNOSIS — N186 End stage renal disease: Secondary | ICD-10-CM | POA: Diagnosis not present

## 2024-06-13 DIAGNOSIS — N2581 Secondary hyperparathyroidism of renal origin: Secondary | ICD-10-CM | POA: Diagnosis not present

## 2024-06-13 DIAGNOSIS — Z992 Dependence on renal dialysis: Secondary | ICD-10-CM | POA: Diagnosis not present

## 2024-06-13 DIAGNOSIS — R52 Pain, unspecified: Secondary | ICD-10-CM | POA: Diagnosis not present

## 2024-06-15 DIAGNOSIS — N2581 Secondary hyperparathyroidism of renal origin: Secondary | ICD-10-CM | POA: Diagnosis not present

## 2024-06-15 DIAGNOSIS — Z992 Dependence on renal dialysis: Secondary | ICD-10-CM | POA: Diagnosis not present

## 2024-06-15 DIAGNOSIS — R52 Pain, unspecified: Secondary | ICD-10-CM | POA: Diagnosis not present

## 2024-06-15 DIAGNOSIS — N186 End stage renal disease: Secondary | ICD-10-CM | POA: Diagnosis not present

## 2024-06-16 ENCOUNTER — Ambulatory Visit: Payer: Self-pay | Admitting: Internal Medicine

## 2024-06-16 ENCOUNTER — Ambulatory Visit (INDEPENDENT_AMBULATORY_CARE_PROVIDER_SITE_OTHER): Admitting: Otolaryngology

## 2024-06-16 ENCOUNTER — Encounter: Payer: Self-pay | Admitting: Internal Medicine

## 2024-06-16 ENCOUNTER — Encounter (INDEPENDENT_AMBULATORY_CARE_PROVIDER_SITE_OTHER): Payer: Self-pay | Admitting: Otolaryngology

## 2024-06-16 VITALS — BP 125/70 | HR 64 | Ht 71.0 in | Wt 360.0 lb

## 2024-06-16 DIAGNOSIS — J343 Hypertrophy of nasal turbinates: Secondary | ICD-10-CM | POA: Diagnosis not present

## 2024-06-16 DIAGNOSIS — R300 Dysuria: Secondary | ICD-10-CM | POA: Diagnosis not present

## 2024-06-16 DIAGNOSIS — R1115 Cyclical vomiting syndrome unrelated to migraine: Secondary | ICD-10-CM | POA: Diagnosis not present

## 2024-06-16 DIAGNOSIS — N186 End stage renal disease: Secondary | ICD-10-CM

## 2024-06-16 DIAGNOSIS — R161 Splenomegaly, not elsewhere classified: Secondary | ICD-10-CM

## 2024-06-16 DIAGNOSIS — I2510 Atherosclerotic heart disease of native coronary artery without angina pectoris: Secondary | ICD-10-CM

## 2024-06-16 DIAGNOSIS — J0181 Other acute recurrent sinusitis: Secondary | ICD-10-CM

## 2024-06-16 DIAGNOSIS — I10 Essential (primary) hypertension: Secondary | ICD-10-CM

## 2024-06-16 DIAGNOSIS — R110 Nausea: Secondary | ICD-10-CM

## 2024-06-16 DIAGNOSIS — R5381 Other malaise: Secondary | ICD-10-CM

## 2024-06-16 DIAGNOSIS — G4733 Obstructive sleep apnea (adult) (pediatric): Secondary | ICD-10-CM

## 2024-06-16 DIAGNOSIS — R0981 Nasal congestion: Secondary | ICD-10-CM

## 2024-06-16 DIAGNOSIS — K7581 Nonalcoholic steatohepatitis (NASH): Secondary | ICD-10-CM

## 2024-06-16 DIAGNOSIS — R7989 Other specified abnormal findings of blood chemistry: Secondary | ICD-10-CM

## 2024-06-16 DIAGNOSIS — K2101 Gastro-esophageal reflux disease with esophagitis, with bleeding: Secondary | ICD-10-CM

## 2024-06-16 DIAGNOSIS — R7303 Prediabetes: Secondary | ICD-10-CM

## 2024-06-16 DIAGNOSIS — L309 Dermatitis, unspecified: Secondary | ICD-10-CM

## 2024-06-16 MED ORDER — VOQUEZNA 20 MG PO TABS: 1.0000 | ORAL_TABLET | Freq: Every day | ORAL | 3 refills | Status: DC

## 2024-06-16 MED ORDER — AZELASTINE HCL 0.1 % NA SOLN: 2.0000 | Freq: Two times a day (BID) | NASAL | 12 refills | Status: AC

## 2024-06-16 NOTE — Progress Notes (Unsigned)
 Dear Dr. Jesus, Here is my assessment for our mutual patient, Sean [name]. Thank you for allowing me the opportunity to care for your patient. Please do not hesitate to contact me should you have any other questions. Sincerely, Dr. Eldora Blanch  Otolaryngology Clinic Note  HISTORY: Sean [name] is a 53 y.o. male kindly referred by Dr. Jesus for evaluation of sinus infections.   Initial visit (11/2023): He reports that he gets a sinus infection every spring and fall. Infection symptoms include some epistaxis, pressure around the eyes and a headache around the forehead like a band. Intermittent discolored drainage and PND. Sense of smell is ok. He had an infection in November 2024, got antibiotics and steroids. They clear up all of his symptoms and he has now cleared all his symptoms about 2 weeks ago, except for some intermittent headache (frontal). At times when his headache is bad, he wants to be in a cold/dark room. No sensitivity to light or sound. No history of migraines.    He has used flonase  before. Not used astelin . He reports he is on an allergy medication but does not know the name. Allergy medication helps, but sprays he does not think help.  Additional evaluation has included CT Sinus.  Allergy testing has been done (years ago - does not know exact results). He does report itchy eyes/nose and symptoms in spring and fall. No previous sinonasal surgery.  He is currently using nothing.  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 From sinus standpoint, he reports that he does not have his typical sinus symptoms but has some congestion in the nose but no significant rhinorrhea or facial pressure/pain, discolored drainage. No infections needing abx. He has not used any nasal sprays but stopped in Feb.    GLP-1: Mounjaro  AP/AC: Eliquis   PMHx: CAD s/p CABG, GERD, OSA, Liver dysfunction, ESRD  on dialysis, Hypothyroid  RADIOGRAPHIC EVALUATION AND INDEPENDENT REVIEW OF OTHER RECORDS:: Dr. Jesus (11/02/2023):He reports sinus pressure, headaches, stuffy nose, and minor trauma-related inflammation in nasal passages, suggesting a possible refractory sinus infection, potentially complicated by CPAP use. We will order a CT scan of the sinuses, refer to an ENT for evaluation of the refractory sinus condition, prescribe a different antibiotic for the sinus infection, and hold off on steroids due to the risk of exacerbating a potential ulcer. Reports sinus issue - pressure in sinus area, increased sleepiness. Does use CPAP for OSA. Dx: Sinsutiis, Rx defdinir, CT Sinus Stephanie Hudnell (10/08/2023): reports pressure behind eyes and sinus related sx (head going to explode). Attribute to allergies. Reports PND; Dx: Allergic rhinitis, sinusitis; Rx: Flonase , Steroid taper, saline spray, Z-pak. CT Sinus (11/2023): independently reviewed - clear paranasal sinuses; no significant septal deviation; does report symptoms during that time but was improving CBC w/diff (12/01/2023): WBC 5.8, Hgb 12.6, Eos 400 CMP 12/01/2023 - Cr 5.78, BUN 50; TSH 12/01/2023: wnl  Past Medical History:  Diagnosis Date   Abnormal liver enzymes    a. Sees a doctor in [place].   Acute renal failure superimposed on stage 5 chronic kidney disease, not on chronic dialysis (HCC) 01/08/2024   AKI (acute kidney injury) (HCC) 06/17/2023   Atrial fibrillation (HCC)    Cardiac cirrhosis    a. possible elevated LFTs/low platelets felt due to cardiac cirrhosis per 2017 admission.   Chronic combined systolic and diastolic CHF (congestive heart failure) (HCC)    Chronic kidney disease, stage 4 (severe) (HCC) 01/04/2016   Lab Results  Component    Value    Date/Time           GFR    29.24 (L)    09/30/2022 02:51 PM           GFR    27.27 (L)    09/24/2022 03:40 PM           GFR    24.10 (L)    09/03/2022 03:45 PM           GFR     18.98 (L)    08/25/2022 12:21 PM           GFR    38.98 (L)    04/24/2022 02:39 PM   Seeing [facility]: Jerrye Katheryn BROCKS, MD  Lastt plan 04/2023: # CKD stage IV    CKD (chronic kidney disease) stage 3, GFR 30-59 ml/min (HCC) 01/04/2016   Lab Results  Component  Value  Date/Time     GFR  29.24 (L)  09/30/2022 02:51 PM     GFR  27.27 (L)  09/24/2022 03:40 PM     GFR  24.10 (L)  09/03/2022 03:45 PM     GFR  18.98 (L)  08/25/2022 12:21 PM     GFR  38.98 (L)  04/24/2022 02:39 PM         CKD (chronic kidney disease), stage II    Stage 4   Congenital heart defect    a. rightward rotation of heart, almost dextrocardia   Coronary artery disease    a. s/p CABGx1 in 12/2015.   Dyspnea    with exertion   Gastric polyps 11/25/2022   Gastritis and gastroduodenitis 11/25/2022          Gastroesophageal reflux disease with esophagitis and hemorrhage 04/04/2021   Hematuria    a. Chronic hx of this, no prior etiology determined through workup per patient.   History of umbilical hernia repair 07/10/2011   History of hernia surgery twice prior   Hypercholesteremia    a. Prev taken off statin due to abnormal liver function.   Hypertension    Incisional hernia 07/10/2011   History of hernia surgery twice prior   Morbid obesity (HCC)    Morbid obesity with BMI of 50.0-59.9, adult (HCC) 01/04/2016   After [facility] doctor is setting him up with a wellness clinic to help with weight losS  He reports not having tried anything for weight loss either diet or or medicine or surgery in the past  We failed to get Wegovy  10/2022      Wt Readings from Last 10 Encounters:  11/13/22  (!) 384 lb 12.8 oz (174.5 kg)  10/13/22  (!) 385 lb (174.6 kg)  09/30/22  (!) 384 lb (174.2 kg)  09/24/22  (!) 382 lb (1   Nausea and vomiting 11/25/2022   OSA on CPAP    Paroxysmal atrial flutter (HCC) 02/12/2016   PONV (postoperative nausea and vomiting)    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   LIMA to LAD   Sinus  bradycardia    Thrombocytopenia (HCC)    Past Surgical History:  Procedure Laterality Date   APPENDECTOMY     AV FISTULA PLACEMENT Left 01/11/2024   Procedure: LEFT RADIOCEPHALIC ARTERIOVENOUS (AV) FISTULA CREATION;  Surgeon: Lanis Fonda BRAVO, MD;  Location: [facility] OR;  Service: Vascular;  Laterality: Left;   BIOPSY  02/19/2021   Procedure: BIOPSY;  Surgeon: Eda Iha, MD;  Location: [facility] ENDOSCOPY;  Service: Gastroenterology;;  BIOPSY  11/25/2022   Procedure: BIOPSY;  Surgeon: Eda Iha, MD;  Location: [facility];  Service: Gastroenterology;;   CARDIAC CATHETERIZATION N/A 12/24/2015   Procedure: Right/Left Heart Cath and Coronary Angiography;  Surgeon: Toribio JONELLE Fuel, MD;  Location: [facility] INVASIVE CV LAB;  Service: Cardiovascular;  Laterality: N/A;   CARDIOVERSION N/A 02/14/2016   Procedure: CARDIOVERSION;  Surgeon: Vinie JAYSON Maxcy, MD;  Location: [facility] ENDOSCOPY;  Service: Cardiovascular;  Laterality: N/A;   COLONOSCOPY WITH PROPOFOL  N/A 02/19/2021   Procedure: COLONOSCOPY WITH PROPOFOL ;  Surgeon: Eda Iha, MD;  Location: [facility] ENDOSCOPY;  Service: Gastroenterology;  Laterality: N/A;   CORONARY ARTERY BYPASS GRAFT N/A 12/26/2015   Procedure: Off Pump Coronary Artery Bypass Grafting times one using left internal mammary artery;  Surgeon: Sudie VEAR Laine, MD;  Location: [facility] OR;  Service: Open Heart Surgery;  Laterality: N/A;   ESOPHAGOGASTRODUODENOSCOPY (EGD) WITH PROPOFOL  N/A 02/19/2021   Procedure: ESOPHAGOGASTRODUODENOSCOPY (EGD) WITH PROPOFOL ;  Surgeon: Eda Iha, MD;  Location: [facility] ENDOSCOPY;  Service: Gastroenterology;  Laterality: N/A;   ESOPHAGOGASTRODUODENOSCOPY (EGD) WITH PROPOFOL  N/A 11/25/2022   Procedure: ESOPHAGOGASTRODUODENOSCOPY (EGD) WITH PROPOFOL ;  Surgeon: Eda Iha, MD;  Location: [facility] ENDOSCOPY;  Service: Gastroenterology;  Laterality: N/A;   FISTULA SUPERFICIALIZATION Left 05/06/2024   Procedure: FISTULA SUPERFICIALIZATION;  Surgeon: Sheree Penne Bruckner, MD;  Location: [facility] OR;  Service: Vascular;  Laterality: Left;   GALLBLADDER SURGERY     HERNIA REPAIR     INSERTION OF DIALYSIS CATHETER Right 01/11/2024   Procedure: INSERTION OF TUNNELED  DIALYSIS CATHETER RIGHT INTERNAL JUGULAR;  Surgeon: Lanis Fonda BRAVO, MD;  Location: [facility] OR;  Service: Vascular;  Laterality: Right;   IR TRANSCATHETER BX  06/26/2023   IR US  GUIDE VASC ACCESS RIGHT  06/26/2023   IR VENOGRAM HEPATIC W HEMODYNAMIC EVALUATION  06/26/2023   LEFT HEART CATH AND CORS/GRAFTS ANGIOGRAPHY N/A 04/15/2017   Procedure: Left Heart Cath and Cors/Grafts Angiography;  Surgeon: Burnard Debby LABOR, MD;  Location: [facility] INVASIVE CV LAB;  Service: Cardiovascular;  Laterality: N/A;   LEFT HEART CATH AND CORS/GRAFTS ANGIOGRAPHY N/A 11/02/2020   Procedure: LEFT HEART CATH AND CORS/GRAFTS ANGIOGRAPHY;  Surgeon: Wonda Sharper, MD;  Location: [facility] INVASIVE CV LAB;  Service: Cardiovascular;  Laterality: N/A;   LIGATION OF COMPETING BRANCHES OF ARTERIOVENOUS FISTULA Left 05/06/2024   Procedure: LIGATION OF COMPETING BRANCHES OF ARTERIOVENOUS FISTULA;  Surgeon: Sheree Penne Bruckner, MD;  Location: [facility] OR;  Service: Vascular;  Laterality: Left;   POLYPECTOMY  02/19/2021   Procedure: POLYPECTOMY;  Surgeon: Eda Iha, MD;  Location: [facility] ENDOSCOPY;  Service: Gastroenterology;;   TEE WITHOUT CARDIOVERSION N/A 12/26/2015   Procedure: TRANSESOPHAGEAL ECHOCARDIOGRAM (TEE);  Surgeon: Sudie VEAR Laine, MD;  Location: [facility] OR;  Service: Open Heart Surgery;  Laterality: N/A;   TEE WITHOUT CARDIOVERSION N/A 02/14/2016   Procedure: TRANSESOPHAGEAL ECHOCARDIOGRAM (TEE);  Surgeon: Vinie JAYSON Maxcy, MD;  Location: [facility] ENDOSCOPY;  Service: Cardiovascular;  Laterality: N/A;   Family History  Problem Relation Age of Onset   CAD Father        4 stents ~ 54   Diabetes Father    Diabetes Maternal Grandfather    Diabetes Maternal Uncle    Heart attack Other    Hyperlipidemia Other    Social History   Tobacco Use    Smoking status: Never   Smokeless tobacco: Never  Substance Use Topics   Alcohol use: Not Currently    Comment: 6 beers per month   Allergies  Allergen Reactions   Glucophage  [  Metformin ] Other (See Comments)    Pt told by his nephrologist not to take this medication due to his kidney function.   Vibra -Tab [Doxycycline ] Shortness Of Breath   Chlorhexidine  Itching   Chlorhexidine  Gluconate Itching and Other (See Comments)    burning   Augmentin [Amoxicillin-Pot Clavulanate] Itching   Imdur  [Isosorbide  Dinitrate] Diarrhea   Tape Rash   Valacyclovir  Itching   Current Outpatient Medications  Medication Sig Dispense Refill   allopurinol  (ZYLOPRIM ) 100 MG tablet TAKE 1 TABLET BY MOUTH EVERY DAY 90 tablet 3   amitriptyline  (ELAVIL ) 25 MG tablet TAKE 1 TABLET BY MOUTH EVERYDAY AT BEDTIME 30 tablet 0   B Complex-C-Folic Acid  (DIALYVITE TABLET) TABS Take 1 tablet by mouth daily.     Cholecalciferol (VITAMIN D3) 125 MCG (5000 UT) CAPS Take 1 capsule (5,000 Units total) by mouth daily at 12 noon. 90 capsule 1   Cyanocobalamin  (VITAMIN B-12) 5000 MCG SUBL Place 5,000 mcg under the tongue daily.     diltiazem  (CARDIZEM  SR) 120 MG 12 hr capsule Take 120 mg by mouth 2 (two) times daily.     ELIQUIS  5 MG TABS tablet TAKE 1 TABLET(5 MG) BY MOUTH TWICE DAILY 180 tablet 1   Evolocumab  (REPATHA ) 140 MG/ML SOSY Inject 140 mg into the skin every 14 (fourteen) days.     iron sucrose in sodium chloride  0.9 % 100 mL Iron Sucrose (Venofer)     ketoconazole  (NIZORAL ) 2 % cream APPLY TO AFFECTED AREA TWICE A DAY 60 g 0   lanthanum (FOSRENOL) 1000 MG chewable tablet Chew 1,000 mg by mouth 3 (three) times daily with meals.     Methoxy PEG-Epoetin Beta (MIRCERA IJ) 75 mcg.     montelukast  (SINGULAIR ) 10 MG tablet TAKE 1 TABLET BY MOUTH EVERY DAY 90 tablet 1   nitroGLYCERIN  (NITROSTAT ) 0.4 MG SL tablet Place 1 tablet (0.4 mg total) under the tongue every 5 (five) minutes as needed for chest pain. 25 tablet 6    ondansetron  (ZOFRAN -ODT) 4 MG disintegrating tablet Take 1 tablet (4 mg total) by mouth every 8 (eight) hours as needed for nausea or vomiting. 30 tablet 2   oxyCODONE -acetaminophen  (PERCOCET) 5-325 MG tablet Take 1 tablet by mouth every 6 (six) hours as needed for severe pain (pain score 7-10). 8 tablet 0   sucralfate  (CARAFATE ) 1 g tablet Take 1 tablet (1 g total) by mouth at bedtime. (Patient taking differently: Take 1 g by mouth at bedtime as needed (Stomach pain).) 30 tablet 1   Vonoprazan Fumarate  (VOQUEZNA ) 20 MG TABS Take 1 tablet by mouth daily at 6 (six) AM. 90 tablet 3   azelastine  (ASTELIN ) 0.1 % nasal spray Place 2 sprays into both nostrils 2 (two) times daily. Use in each nostril as directed 30 mL 12   No current facility-administered medications for this visit.   Facility-Administered Medications Ordered in Other Visits  Medication Dose Route Frequency Provider Last Rate Last Admin   technetium tetrofosmin  (TC-MYOVIEW ) injection 29.8 millicurie  29.8 millicurie Intravenous Once PRN Hilty, Vinie BROCKS, MD       BP 125/70 (BP Location: Right Arm, Patient Position: Sitting, Cuff Size: Large)   Pulse 64   Ht 5' 11 (1.803 m)   Wt (!) 360 lb (163.3 kg)   SpO2 (!) 65%   BMI 50.21 kg/m   PHYSICAL EXAM:  BP 125/70 (BP Location: Right Arm, Patient Position: Sitting, Cuff Size: Large)   Pulse 64   Ht 5' 11 (1.803 m)   [place] ROLLEN)  360 lb (163.3 kg)   SpO2 (!) 65%   BMI 50.21 kg/m   Salient findings:  CN II-XII intact  Bilateral EAC clear and TM intact with well pneumatized middle ear spaces Nose: Anterior rhinoscopy reveals relatively midline nasal septum, bilateral inferior turbinates with mild hypertrophy.  Nasal endoscopy was indicated to better evaluate the nose and paranasal sinuses, given the patient's history and exam findings, and is detailed below. No obviously palpable neck masses/lymphadenopathy/thyromegaly No respiratory distress or stridor   PROCEDURE: Diagnostic Nasal  Endoscopy Pre-procedure diagnosis: Concern for chronic sinusitis, re-evaluation Post-procedure diagnosis: same Indication: See pre-procedure diagnosis and physical exam above Complications: None apparent EBL: 0 mL Anesthesia: Lidocaine  4% and topical decongestant was topically sprayed in each nasal cavity  Description of Procedure:  Patient was identified. A rigid 30 degree endoscope was utilized to evaluate the sinonasal cavities, mucosa, sinus ostia and turbinates and septum.  Overall, signs of mucosal inflammation are very minimal with global mucosal edema. No mucopurulence, polyps, or masses noted.   Right Middle meatus: Clear Right SE Recess: Clear Left MM: Clear Left SE Recess: Clear   CPT CODE -- 68768 - Mod 25   ASSESSMENT:  53 y.o. with multiple comorbidity with: 1. Other acute recurrent sinusitis   2. Nasal congestion   3. Hypertrophy of both inferior nasal turbinates    Issues last winter with sinus infections, but he has not had significant issues from infection standpoint this past spring. Endo again overall reassuring as well as prior CT without significant disease. Given lack of symptoms,  He is asymptomatic and prior CT and current endoscopy is overall reassuring  We've discussed issues and options today.  We reviewed the nasal endoscopy images together.  The risks, benefits and alternatives were discussed and questions answered.  Given continued multiple medical comorbidities and reassuring CT overall prior as well as relatively infrequent flares, would recommend medical management especially in light of his recent health changes. He agrees  - Re-Start Flonase  BID and Astelin  BID (some AR sx) - Re-start Daily sinuses rinses - He uses CPAP at night and reports nose feels dry so will add Ayr Gel QID -- especially at night. - we discussed f/u, and he opted PRN given his current health conditions. He will contact us  in case of frequent sinus infections or  recurrence.  See below regarding exact medications prescribed this encounter including dosages and route: Meds ordered this encounter  Medications   azelastine  (ASTELIN ) 0.1 % nasal spray    Sig: Place 2 sprays into both nostrils 2 (two) times daily. Use in each nostril as directed    Dispense:  30 mL    Refill:  12     Thank you for allowing me the opportunity to care for your patient. Please do not hesitate to contact me should you have any other questions.  Sincerely, Eldora Blanch, MD Otolarynoglogist (ENT), [facility] Phone: [phone] Fax: [phone]  I have personally spent 30 minutes involved in face-to-face and non-face-to-face activities for this patient on the day of the visit.  Professional time spent excludes any procedures performed but includes the following activities, in addition to those noted in the documentation: preparing to see the patient (review of outside documentation and results), performing a medically appropriate examination, counseling, ordering medications (astelin ), documenting in the electronic health record, re-review of prior CT.   06/17/2024, 9:36 AM

## 2024-06-16 NOTE — Patient Instructions (Signed)
 VISIT SUMMARY:  Today, we discussed several of your health concerns, including protein levels, skin issues, gastrointestinal problems, blood pressure, and medication management. We also addressed your urinary symptoms and difficulties with disability claims and insurance coverage.  YOUR PLAN:  -PRURITUS: Pruritus means itching. You are experiencing burning and itching in your fingers and hands, possibly related to fluid shifts during dialysis. We will refer you to a dermatologist to evaluate these skin issues, including any moles under your arm.  -LOW PROTEIN LEVELS: Your protein levels have shown some discrepancies between different healthcare providers. The dialysis nurse noted a level of 2.4, which is slightly below the desired 2.5. However, Dr. Tobie believes this is not a significant concern. The kidney team will continue to monitor and manage your protein levels.  -HYPERTENSION: Hypertension means high blood pressure. Your blood pressure has been running slightly low, and we suggest stopping metoprolol , one of your blood pressure medications. Please discuss this change with your kidney doctor.  -GASTROESOPHAGEAL REFLUX DISEASE (GERD): GERD is a condition where stomach acid frequently flows back into the tube connecting your mouth and stomach, causing heartburn. Despite taking pantoprazole , you still experience heartburn and vomiting. We provided samples of a new heartburn medication, which should be more effective. You should discontinue pantoprazole , start the new medication, and follow up with Dr. Federico Cassis for further evaluation.  -MEDICATION MANAGEMENT FOR WEIGHT LOSS: You previously used Mounjaro  and Zepbound  for weight management but experienced severe vomiting with Mounjaro  at a higher dose. Due to insurance issues, you have not been able to access the lower dose. We discussed appealing to your insurance company for coverage and considering Wegovy , which may have kidney protective  effects. You will not start any new weight loss medication until your gastrointestinal issues are resolved.  -URINARY SYMPTOMS: You reported a burning sensation after urination, which could indicate a urinary tract infection or kidney stones. We will perform a urinalysis to investigate further.  -DISABILITY AND INSURANCE ISSUES: You are having difficulties with disability claims and insurance coverage for medications. We will assist you with navigating insurance appeals and coordinate with social workers to help with your disability claims.  INSTRUCTIONS:  Please follow up with the kidney doctor to discuss stopping metoprolol . Start the new heartburn medication and discontinue pantoprazole . Follow up with Dr. Federico Cassis for further evaluation of your GERD. We will refer you to dermatology for your skin issues and perform a urinalysis to investigate your urinary symptoms. Coordination with social workers will be arranged to assist with your disability claims and insurance appeals.

## 2024-06-16 NOTE — Progress Notes (Signed)
 ==============================  [facility]: [phone]   -- Medical Office Visit --  Patient: Sean [name]      Age: 53 y.o.       Sex:  male  Date:   06/16/2024 Today's Healthcare Provider: Bernardino KANDICE Cone, MD  ==============================   Chief Complaint: Nausea (Still throwing up and having a lot of acid on stomach.) Weight management    Discussed the use of AI scribe software for clinical note transcription with the patient, who gave verbal consent to proceed.  History of Present Illness  53 year old male with chronic kidney disease on dialysis who presents with concerns about protein levels and skin issues.  He is experiencing discrepancies in his protein levels as reported by different healthcare providers. One provider reports his blood work as good, while the dialysis nurse notes his protein levels are too low, specifically mentioning a level of 2.4 when it should be 2.5. He seeks consistency in these reports.  He has skin issues, including dry skin and itching, particularly on his hands and fingers. He describes a burning sensation and itching between his fingers, which he attributes to fluid changes during dialysis. He uses a steroid cream for his dry skin.  He has a history of gastrointestinal issues, including nausea and vomiting, which he associates with heartburn. He vomited this morning and experienced heartburn after eating a salad the previous night. He is currently taking pantoprazole  40 mg twice a day, but still experiences heartburn occasionally.  Patient also presents for management of morbid obesity, which has resulted in severe functional limitations (difficulty ambulating, reduced ability to perform ADLs, inability to work), and has failed to achieve adequate weight loss despite multiple, sustained attempts at diet and lifestyle modification. Previous pharmacologic interventions (e.g., Mounjaro  at 2.5 mg) resulted  in partial weight loss but were limited by insurance coverage and GI side effects at higher doses.  He has been on tirzepatide  as Mounjaro  previously for his conditions but experienced severe vomiting when the dosage was increased to 5 mg. His insurance has not approved resuming the 2.5 mg dosage, and so he has been unable to continue the medication.  However, the vomiting has not resolved despite holding the GLP-1 agonist so it is not thought to be related anymore.  He reports urinary symptoms, including a burning sensation after urination. A dialysis nurse suggested it could be related to kidney issues.  He has a history of high blood pressure and is currently on multiple medications. He feels weak and 'like I'm drunk' at times, and his blood pressure has been running low. He mentions a conversation with a kidney doctor's assistant about possibly reducing his blood pressure medication.  He has a history of sleep apnea and has been using a CPAP machine for 10-15 years. He also mentions a history of bleeding ulcers and ongoing gastrointestinal issues since childhood.    Background Reviewed: Problem List: has History of umbilical hernia repair; Hypertension; Hyperlipidemia; Thrombocytopenia (HCC); Sinus bradycardia; Atherosclerotic heart disease of native coronary artery without angina pectoris; Atrial flutter with rapid ventricular response (HCC); Morbid obesity (HCC); Other long term (current) drug therapy; History of coronary artery bypass surgery; Obstructive sleep apnea syndrome; Chronic gout of multiple sites; Alkaline phosphatase elevation; Gout; Ectatic aorta (HCC); Gastroesophageal reflux disease without esophagitis; Herpes simplex; Metabolic dysfunction-associated steatohepatitis (MASH); Prediabetes; Statins contraindicated; Hypothyroid; Chronic venous stasis dermatitis of both lower extremities; Chronic nausea; Chronic liver failure without hepatic coma (HCC); Splenomegaly; Spondylolisthesis;  Bleeding diathesis (HCC); Malaise and fatigue; Allergic rhinitis due to pollen; Amitriptyline  adverse reaction; MGUS (monoclonal gammopathy of unknown significance); 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; Rectal bleeding; Recurrent sinusitis; Asthma, chronic; ESRD (end stage renal disease) (HCC); SVT (supraventricular tachycardia) (HCC); PUD (peptic ulcer disease); Disability affecting daily living; Inflamed internal hemorrhoid; Anemia in chronic kidney disease; Chronic combined systolic (congestive) and diastolic (congestive) heart failure (HCC); Coagulation defect, unspecified (HCC); Dependence on renal dialysis (HCC); Gout due to renal impairment, unspecified site; Hypertensive chronic kidney disease with stage 5 chronic kidney disease or end stage renal disease (HCC); Mild protein-calorie malnutrition (HCC); Other chronic pain; Other disorders of phosphorus metabolism; Other fluid overload; Iron deficiency anemia, unspecified; Other iron deficiency anemias; Pruritus, unspecified; Pure hypercholesterolemia, unspecified; Secondary hyperparathyroidism of renal origin (HCC); Shortness of breath; and Abnormal bowel habits on their problem list. Past Medical History:  has a past medical history of Abnormal liver enzymes, Acute renal failure superimposed on stage 5 chronic kidney disease, not on chronic dialysis (HCC) (01/08/2024), AKI (acute kidney injury) (HCC) (06/17/2023), Atrial fibrillation (HCC), Cardiac cirrhosis, Chronic combined systolic and diastolic CHF (congestive heart failure) (HCC), Chronic kidney disease, stage 4 (severe) (HCC) (01/04/2016), CKD (chronic kidney disease) stage 3, GFR 30-59 ml/min (HCC) (01/04/2016), CKD (chronic kidney disease), stage II, Congenital heart defect, Coronary artery disease, Dyspnea, Gastric polyps (11/25/2022), Gastritis and gastroduodenitis (11/25/2022), Gastroesophageal reflux disease with esophagitis and hemorrhage (04/04/2021), Hematuria, History of umbilical  hernia repair (07/10/2011), Hypercholesteremia, Hypertension, Incisional hernia (07/10/2011), Morbid obesity (HCC), Morbid obesity with BMI of 50.0-59.9, adult (HCC) (01/04/2016), Nausea and vomiting (11/25/2022), OSA on CPAP, Paroxysmal atrial flutter (HCC) (02/12/2016), PONV (postoperative nausea and vomiting), 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 Past Surgical History:   has a past surgical history that includes Gallbladder surgery; Appendectomy; Hernia repair; Cardiac catheterization (N/A, 12/24/2015); Coronary artery bypass graft (N/A, 12/26/2015); TEE without cardioversion (N/A, 12/26/2015); Cardioversion (N/A, 02/14/2016); TEE without cardioversion (N/A, 02/14/2016); LEFT HEART CATH AND CORS/GRAFTS ANGIOGRAPHY (N/A, 04/15/2017); LEFT HEART CATH AND CORS/GRAFTS ANGIOGRAPHY (N/A, 11/02/2020); Colonoscopy with propofol  (N/A, 02/19/2021); Esophagogastroduodenoscopy (egd) with propofol  (N/A, 02/19/2021); biopsy (02/19/2021); polypectomy (02/19/2021); Esophagogastroduodenoscopy (egd) with propofol  (N/A, 11/25/2022); biopsy (11/25/2022); IR Transcatheter BX (06/26/2023); IR US  Guide Vasc Access Right (06/26/2023); IR Venogram Hepatic W Hemodynamic Evaluation (06/26/2023); AV fistula placement (Left, 01/11/2024); Insertion of dialysis catheter (Right, 01/11/2024); Fistula superficialization (Left, 05/06/2024); and Ligation of competing branches of arteriovenous fistula (Left, 05/06/2024). Social History:   reports that he has never smoked. He has never used smokeless tobacco. He reports that he does not currently use alcohol. He reports that he does not use drugs. Family History:  family history includes CAD in his father; Diabetes in his father, maternal grandfather, and maternal uncle; Heart attack in an other family member; Hyperlipidemia in an other family member. Allergies:  is allergic to glucophage  [metformin ], vibra -tab [doxycycline ], chlorhexidine , chlorhexidine  gluconate,  augmentin [amoxicillin-pot clavulanate], imdur  [isosorbide  dinitrate], tape, and valacyclovir .   Medication Reconciliation: Current Outpatient Medications on File Prior to Visit  Medication Sig   allopurinol  (ZYLOPRIM ) 100 MG tablet TAKE 1 TABLET BY MOUTH EVERY DAY   amitriptyline  (ELAVIL ) 25 MG tablet TAKE 1 TABLET BY MOUTH EVERYDAY AT BEDTIME   B Complex-C-Folic Acid  (DIALYVITE TABLET) TABS Take 1 tablet by mouth daily.   Cholecalciferol (VITAMIN D3) 125 MCG (5000 UT) CAPS Take 1 capsule (5,000 Units total) by mouth daily at 12 noon.   Cyanocobalamin  (VITAMIN B-12) 5000 MCG SUBL Place 5,000 mcg under the tongue daily.  diltiazem  (CARDIZEM  SR) 120 MG 12 hr capsule Take 120 mg by mouth 2 (two) times daily.   ELIQUIS  5 MG TABS tablet TAKE 1 TABLET(5 MG) BY MOUTH TWICE DAILY   Evolocumab  (REPATHA ) 140 MG/ML SOSY Inject 140 mg into the skin every 14 (fourteen) days.   iron sucrose in sodium chloride  0.9 % 100 mL Iron Sucrose (Venofer)   ketoconazole  (NIZORAL ) 2 % cream APPLY TO AFFECTED AREA TWICE A DAY   lanthanum (FOSRENOL) 1000 MG chewable tablet Chew 1,000 mg by mouth 3 (three) times daily with meals.   Methoxy PEG-Epoetin Beta (MIRCERA IJ) 75 mcg.   montelukast  (SINGULAIR ) 10 MG tablet TAKE 1 TABLET BY MOUTH EVERY DAY   nitroGLYCERIN  (NITROSTAT ) 0.4 MG SL tablet Place 1 tablet (0.4 mg total) under the tongue every 5 (five) minutes as needed for chest pain.   ondansetron  (ZOFRAN -ODT) 4 MG disintegrating tablet Take 1 tablet (4 mg total) by mouth every 8 (eight) hours as needed for nausea or vomiting.   oxyCODONE -acetaminophen  (PERCOCET) 5-325 MG tablet Take 1 tablet by mouth every 6 (six) hours as needed for severe pain (pain score 7-10).   sucralfate  (CARAFATE ) 1 g tablet Take 1 tablet (1 g total) by mouth at bedtime. (Patient taking differently: Take 1 g by mouth at bedtime as needed (Stomach pain).)   Current Facility-Administered Medications on File Prior to Visit  Medication    technetium tetrofosmin  (TC-MYOVIEW ) injection 29.8 millicurie   Medications Discontinued During This Encounter  Medication Reason   tirzepatide  (ZEPBOUND ) 2.5 MG/0.5ML Pen    pantoprazole  (PROTONIX ) 40 MG tablet Dose change   metoprolol  succinate (TOPROL -XL) 25 MG 24 hr tablet Completed Course     Physical Exam:    06/16/2024    3:53 PM 06/16/2024   10:37 AM 06/07/2024   10:22 AM  Vitals with BMI  Height 5' 11 5' 11 5' 11  Weight 360 lbs 361 lbs 6 oz 367 lbs 2 oz  BMI 50.23 50.43 51.22  Systolic 125 108 886  Diastolic 70 58 50  Pulse 64 72 64  Vital signs reviewed.  Nursing notes reviewed. Weight trend reviewed. Physical Exam General Appearance:  No acute distress appreciable.   Well-groomed, healthy-appearing male.  Well proportioned with no abnormal fat distribution.  Good muscle tone. Pulmonary:  Normal work of breathing at rest, no respiratory distress apparent. SpO2: 98 %  Musculoskeletal: All extremities are intact.  Neurological:  Awake, alert, oriented, and engaged.  No obvious focal neurological deficits or cognitive impairments.  Sensorium seems unclouded.   Speech is clear and coherent with logical content. Psychiatric:  Appropriate mood, pleasant and cooperative demeanor, thoughtful and engaged during the exam Physical Exam   Truncal adiposity   Results:    04/22/2024    9:21 AM 02/12/2024    1:08 PM 02/05/2024    8:26 AM 11/02/2023   10:55 AM  PHQ 2/9 Scores  PHQ - 2 Score 0 0 0 2  PHQ- 9 Score 0   8   Results LABS Total protein: Normal (03/2024) Blood glucose: 174 mg/dL (92/82/7974)    Results for orders placed or performed in visit on 06/16/24  Urine Culture  Result Value Ref Range   MICRO NUMBER: [id number]    SPECIMEN QUALITY: Adequate    Sample Source URINE    STATUS: PRELIMINARY    ISOLATE 1: Gram negative bacilli isolated (A)   Urinalysis w microscopic + reflex cultur   Specimen: Urine  Result Value Ref Range   Color,  Urine DARK YELLOW YELLOW    APPearance TURBID (A) CLEAR   Specific Gravity, Urine 1.019 1.001 - 1.035   pH 5.5 5.0 - 8.0   Glucose, UA NEGATIVE NEGATIVE   Bilirubin Urine NEGATIVE NEGATIVE   Ketones, ur TRACE (A) NEGATIVE   Hgb urine dipstick TRACE (A) NEGATIVE   Protein, ur 2+ (A) NEGATIVE   Nitrites, Initial NEGATIVE NEGATIVE   Leukocyte Esterase 3+ (A) NEGATIVE   WBC, UA > OR = 60 (A) 0 - 5 /HPF   RBC / HPF NONE SEEN 0 - 2 /HPF   Squamous Epithelial / HPF 0-5 < OR = 5 /HPF   Bacteria, UA MANY (A) NONE SEEN /HPF   Hyaline Cast NONE SEEN NONE SEEN /LPF   Note    REFLEXIVE URINE CULTURE  Result Value Ref Range   REFLEXIVE URINE CULTURE     Office Visit on 06/16/2024  Component Date Value Ref Range Status   Color, Urine 06/16/2024 DARK YELLOW  YELLOW Final   APPearance 06/16/2024 TURBID (A)  CLEAR Final   Specific Gravity, Urine 06/16/2024 1.019  1.001 - 1.035 Final   pH 06/16/2024 5.5  5.0 - 8.0 Final   Glucose, UA 06/16/2024 NEGATIVE  NEGATIVE Final   Bilirubin Urine 06/16/2024 NEGATIVE  NEGATIVE Final   Ketones, ur 06/16/2024 TRACE (A)  NEGATIVE Final   Hgb urine dipstick 06/16/2024 TRACE (A)  NEGATIVE Final   Protein, ur 06/16/2024 2+ (A)  NEGATIVE Final   Nitrites, Initial 06/16/2024 NEGATIVE  NEGATIVE Final   Leukocyte Esterase 06/16/2024 3+ (A)  NEGATIVE Final   WBC, UA 06/16/2024 > OR = 60 (A)  0 - 5 /HPF Final   RBC / HPF 06/16/2024 NONE SEEN  0 - 2 /HPF Final   Squamous Epithelial / HPF 06/16/2024 0-5  < OR = 5 /HPF Final   Bacteria, UA 06/16/2024 MANY (A)  NONE SEEN /HPF Final   Hyaline Cast 06/16/2024 NONE SEEN  NONE SEEN /LPF Final   Note 06/16/2024    Final   MICRO NUMBER: 06/16/2024 [id number]   Preliminary   SPECIMEN QUALITY: 06/16/2024 Adequate   Preliminary   Sample Source 06/16/2024 URINE   Preliminary   STATUS: 06/16/2024 PRELIMINARY   Preliminary   ISOLATE 1: 06/16/2024 Gram negative bacilli isolated (A)   Preliminary   REFLEXIVE URINE CULTURE 06/16/2024    Final  Admission on  05/06/2024, Discharged on 05/06/2024  Component Date Value Ref Range Status   Sodium 05/06/2024 135  135 - 145 mmol/L Final   Potassium 05/06/2024 3.7  3.5 - 5.1 mmol/L Final   Chloride 05/06/2024 101  98 - 111 mmol/L Final   BUN 05/06/2024 38 (H)  6 - 20 mg/dL Final   Creatinine, Ser 05/06/2024 7.50 (H)  0.61 - 1.24 mg/dL Final   Glucose, Bld 93/93/7974 166 (H)  70 - 99 mg/dL Final   Calcium , Ion 05/06/2024 1.02 (L)  1.15 - 1.40 mmol/L Final   TCO2 05/06/2024 23  22 - 32 mmol/L Final   Hemoglobin 05/06/2024 11.2 (L)  13.0 - 17.0 g/dL Final   HCT 93/93/7974 33.0 (L)  39.0 - 52.0 % Final  Scanned Document on 03/24/2024  Component Date Value Ref Range Status   Creatinine, POC 05/30/2023 95.2  mg/dL Final   EGFR 93/70/7975 18.0   Final  Office Visit on 03/14/2024  Component Date Value Ref Range Status   Cholesterol 03/14/2024 208 (H)  0 - 200 mg/dL Final   Triglycerides 95/85/7974 167.0 (H)  0.0 - 149.0 mg/dL Final   HDL 95/85/7974 50.60  >39.00 mg/dL Final   VLDL 95/85/7974 33.4  0.0 - 40.0 mg/dL Final   LDL Cholesterol 03/14/2024 124 (H)  0 - 99 mg/dL Final   Total CHOL/HDL Ratio 03/14/2024 4   Final   NonHDL 03/14/2024 157.38   Final   Sodium 03/14/2024 137  135 - 145 mEq/L Final   Potassium 03/14/2024 4.3  3.5 - 5.1 mEq/L Final   Chloride 03/14/2024 98  96 - 112 mEq/L Final   CO2 03/14/2024 22  19 - 32 mEq/L Final   Glucose, Bld 03/14/2024 152 (H)  70 - 99 mg/dL Final   BUN 95/85/7974 69 (H)  6 - 23 mg/dL Final   Creatinine, Ser 03/14/2024 10.96 (HH)  0.40 - 1.50 mg/dL Final   Total Bilirubin 03/14/2024 1.6 (H)  0.2 - 1.2 mg/dL Final   Alkaline Phosphatase 03/14/2024 244 (H)  39 - 117 U/L Final   AST 03/14/2024 55 (H)  0 - 37 U/L Final   ALT 03/14/2024 64 (H)  0 - 53 U/L Final   Total Protein 03/14/2024 6.1  6.0 - 8.3 g/dL Final   Albumin  03/14/2024 3.3 (L)  3.5 - 5.2 g/dL Final   GFR 95/85/7974 4.94 (LL)  >60.00 mL/min Final   Calcium  03/14/2024 9.2  8.4 - 10.5 mg/dL Final    WBC 95/85/7974 7.8  4.0 - 10.5 K/uL Final   RBC 03/14/2024 3.07 (L)  4.22 - 5.81 Mil/uL Final   Hemoglobin 03/14/2024 11.0 (L)  13.0 - 17.0 g/dL Final   HCT 95/85/7974 32.0 (L)  39.0 - 52.0 % Final   MCV 03/14/2024 104.2 (H)  78.0 - 100.0 fl Final   MCHC 03/14/2024 34.4  30.0 - 36.0 g/dL Final   RDW 95/85/7974 15.9 (H)  11.5 - 15.5 % Final   Platelets 03/14/2024 89.0 (L)  150.0 - 400.0 K/uL Final   Neutrophils Relative % 03/14/2024 59.4  43.0 - 77.0 % Final   Lymphocytes Relative 03/14/2024 17.6  12.0 - 46.0 % Final   Monocytes Relative 03/14/2024 16.0 (H)  3.0 - 12.0 % Final   Eosinophils Relative 03/14/2024 6.6 (H)  0.0 - 5.0 % Final   Basophils Relative 03/14/2024 0.4  0.0 - 3.0 % Final   Neutro Abs 03/14/2024 4.6  1.4 - 7.7 K/uL Final   Lymphs Abs 03/14/2024 1.4  0.7 - 4.0 K/uL Final   Monocytes Absolute 03/14/2024 1.2 (H)  0.1 - 1.0 K/uL Final   Eosinophils Absolute 03/14/2024 0.5  0.0 - 0.7 K/uL Final   Basophils Absolute 03/14/2024 0.0  0.0 - 0.1 K/uL Final   TSH 03/14/2024 2.070  0.450 - 4.500 uIU/mL Final   Hgb A1c MFr Bld 03/14/2024 6.1  4.6 - 6.5 % Final   Magnesium  03/14/2024 2.4  1.5 - 2.5 mg/dL Final   Phosphorus 95/85/7974 8.1 (H)  2.3 - 4.6 mg/dL Final  Appointment on 97/82/7974  Component Date Value Ref Range Status   WBC Count 01/18/2024 7.4  4.0 - 10.5 K/uL Final   RBC 01/18/2024 3.33 (L)  4.22 - 5.81 MIL/uL Final   Hemoglobin 01/18/2024 11.4 (L)  13.0 - 17.0 g/dL Final   HCT 97/82/7974 33.4 (L)  39.0 - 52.0 % Final   MCV 01/18/2024 100.3 (H)  80.0 - 100.0 fL Final   MCH 01/18/2024 34.2 (H)  26.0 - 34.0 pg Final   MCHC 01/18/2024 34.1  30.0 - 36.0 g/dL Final   RDW 97/82/7974 14.8  11.5 - 15.5 % Final   Platelet Count 01/18/2024 70 (L)  150 - 400 K/uL Final   nRBC 01/18/2024 0.00  0.0 - 0.2 % Final   Neutrophils Relative % 01/18/2024 48  % Final   Neutro Abs 01/18/2024 3.6  1.7 - 7.7 K/uL Final   Lymphocytes Relative 01/18/2024 26  % Final   Lymphs Abs  01/18/2024 1.9  0.7 - 4.0 K/uL Final   Monocytes Relative 01/18/2024 16  % Final   Monocytes Absolute 01/18/2024 1.2 (H)  0.1 - 1.0 K/uL Final   Eosinophils Relative 01/18/2024 8  % Final   Eosinophils Absolute 01/18/2024 0.6 (H)  0.0 - 0.5 K/uL Final   Basophils Relative 01/18/2024 1  % Final   Basophils Absolute 01/18/2024 0.1  0.0 - 0.1 K/uL Final   Immature Granulocytes 01/18/2024 1  % Final   Abs Immature Granulocytes 01/18/2024 0.07  0.00 - 0.07 K/uL Final   nRBC 01/18/2024 0  0 /100 WBC Final   Immature Platelet Fraction 01/18/2024 0.3 (L)  1.2 - 8.6 % Final   Ferritin 01/18/2024 114  24 - 336 ng/mL Final   Iron 01/18/2024 152  45 - 182 ug/dL Final   TIBC 97/82/7974 456 (H)  250 - 450 ug/dL Final   Saturation Ratios 01/18/2024 33  17.9 - 39.5 % Final   UIBC 01/18/2024 304  ug/dL Final   Vitamin A-87 97/82/7974 6,881 (H)  180 - 914 pg/mL Final   IgG (Immunoglobin G), Serum 01/18/2024 561 (L)  603 - 1,613 mg/dL Final   IgA 97/82/7974 1,203 (H)  90 - 386 mg/dL Final   IgM (Immunoglobulin M), Srm 01/18/2024 91  20 - 172 mg/dL Final   Total Protein ELP 01/18/2024 5.9 (L)  6.0 - 8.5 g/dL Corrected   Albumin  SerPl Elph-Mcnc 01/18/2024 2.8 (L)  2.9 - 4.4 g/dL Corrected   Alpha 1 97/82/7974 0.3  0.0 - 0.4 g/dL Corrected   Alpha2 Glob SerPl Elph-Mcnc 01/18/2024 0.6  0.4 - 1.0 g/dL Corrected   B-Globulin SerPl Elph-Mcnc 01/18/2024 1.7 (H)  0.7 - 1.3 g/dL Corrected   Gamma Glob SerPl Elph-Mcnc 01/18/2024 0.4  0.4 - 1.8 g/dL Corrected   M Protein SerPl Elph-Mcnc 01/18/2024 0.9 (H)  Not Observed g/dL Corrected   Globulin, Total 01/18/2024 3.1  2.2 - 3.9 g/dL Corrected   Albumin /Glob SerPl 01/18/2024 1.0  0.7 - 1.7 Corrected   IFE 1 01/18/2024 Comment (A)   Corrected   Please Note 01/18/2024 Comment   Corrected   Kappa free light chain 01/18/2024 1,099.9 (H)  3.3 - 19.4 mg/L Final   Lambda free light chains 01/18/2024 33.2 (H)  5.7 - 26.3 mg/L Final   Kappa, lambda light chain ratio  01/18/2024 33.13 (H)  0.26 - 1.65 Final  No results displayed because visit has over 200 results.    Admission on 01/05/2024, Discharged on 01/05/2024  Component Date Value Ref Range Status   WBC 01/05/2024 6.5  4.0 - 10.5 K/uL Final   RBC 01/05/2024 3.17 (L)  4.22 - 5.81 MIL/uL Final   Hemoglobin 01/05/2024 10.8 (L)  13.0 - 17.0 g/dL Final   HCT 97/95/7974 32.8 (L)  39.0 - 52.0 % Final   MCV 01/05/2024 103.5 (H)  80.0 - 100.0 fL Final   MCH 01/05/2024 34.1 (H)  26.0 - 34.0 pg Final   MCHC 01/05/2024 32.9  30.0 - 36.0 g/dL Final   RDW 97/95/7974 14.2  11.5 - 15.5 % Final   Platelets 01/05/2024 66 (  L)  150 - 400 K/uL Final   nRBC 01/05/2024 0.0  0.0 - 0.2 % Final   Neutrophils Relative % 01/05/2024 54  % Final   Neutro Abs 01/05/2024 3.6  1.7 - 7.7 K/uL Final   Lymphocytes Relative 01/05/2024 22  % Final   Lymphs Abs 01/05/2024 1.4  0.7 - 4.0 K/uL Final   Monocytes Relative 01/05/2024 12  % Final   Monocytes Absolute 01/05/2024 0.8  0.1 - 1.0 K/uL Final   Eosinophils Relative 01/05/2024 10  % Final   Eosinophils Absolute 01/05/2024 0.6 (H)  0.0 - 0.5 K/uL Final   Basophils Relative 01/05/2024 1  % Final   Basophils Absolute 01/05/2024 0.1  0.0 - 0.1 K/uL Final   Immature Granulocytes 01/05/2024 1  % Final   Abs Immature Granulocytes 01/05/2024 0.05  0.00 - 0.07 K/uL Final   Sodium 01/05/2024 142  135 - 145 mmol/L Final   Potassium 01/05/2024 3.9  3.5 - 5.1 mmol/L Final   Chloride 01/05/2024 107  98 - 111 mmol/L Final   CO2 01/05/2024 24  22 - 32 mmol/L Final   Glucose, Bld 01/05/2024 151 (H)  70 - 99 mg/dL Final   BUN 97/95/7974 57 (H)  6 - 20 mg/dL Final   Creatinine, Ser 01/05/2024 6.95 (H)  0.61 - 1.24 mg/dL Final   Calcium  01/05/2024 8.9  8.9 - 10.3 mg/dL Final   Total Protein 97/95/7974 5.9 (L)  6.5 - 8.1 g/dL Final   Albumin  01/05/2024 2.4 (L)  3.5 - 5.0 g/dL Final   AST 97/95/7974 27  15 - 41 U/L Final   ALT 01/05/2024 26  0 - 44 U/L Final   Alkaline Phosphatase  01/05/2024 140 (H)  38 - 126 U/L Final   Total Bilirubin 01/05/2024 0.7  0.0 - 1.2 mg/dL Final   GFR, Estimated 01/05/2024 9 (L)  >60 mL/min Final   Anion gap 01/05/2024 11  5 - 15 Final   Magnesium  01/05/2024 2.4  1.7 - 2.4 mg/dL Final   B Natriuretic Peptide 01/05/2024 430.6 (H)  0.0 - 100.0 pg/mL Final   SARS Coronavirus 2 by RT PCR 01/05/2024 NEGATIVE  NEGATIVE Final   Influenza A by PCR 01/05/2024 NEGATIVE  NEGATIVE Final   Influenza B by PCR 01/05/2024 NEGATIVE  NEGATIVE Final   Resp Syncytial Virus by PCR 01/05/2024 NEGATIVE  NEGATIVE Final  Scanned Document on 12/28/2023  Component Date Value Ref Range Status   PTH 12/23/2023 177   Final   Calcium  12/23/2023 9.1   Final   EGFR 12/23/2023 10.0   Final  Office Visit on 12/14/2023  Component Date Value Ref Range Status   WBC 12/14/2023 6.3  4.0 - 10.5 K/uL Final   RBC 12/14/2023 3.69 (L)  4.22 - 5.81 Mil/uL Final   Hemoglobin 12/14/2023 12.6 (L)  13.0 - 17.0 g/dL Final   HCT 98/86/7974 37.0 (L)  39.0 - 52.0 % Final   MCV 12/14/2023 100.3 (H)  78.0 - 100.0 fl Final   MCHC 12/14/2023 34.0  30.0 - 36.0 g/dL Final   RDW 98/86/7974 14.3  11.5 - 15.5 % Final   Platelets 12/14/2023 70.0 (L)  150.0 - 400.0 K/uL Final   Neutrophils Relative % 12/14/2023 67.3  43.0 - 77.0 % Final   Lymphocytes Relative 12/14/2023 16.1  12.0 - 46.0 % Final   Monocytes Relative 12/14/2023 11.9  3.0 - 12.0 % Final   Eosinophils Relative 12/14/2023 3.9  0.0 - 5.0 % Final   Basophils  Relative 12/14/2023 0.8  0.0 - 3.0 % Final   Neutro Abs 12/14/2023 4.2  1.4 - 7.7 K/uL Final   Lymphs Abs 12/14/2023 1.0  0.7 - 4.0 K/uL Final   Monocytes Absolute 12/14/2023 0.8  0.1 - 1.0 K/uL Final   Eosinophils Absolute 12/14/2023 0.2  0.0 - 0.7 K/uL Final   Basophils Absolute 12/14/2023 0.0  0.0 - 0.1 K/uL Final   Sodium 12/14/2023 143  135 - 145 mEq/L Final   Potassium 12/14/2023 3.5  3.5 - 5.1 mEq/L Final   Chloride 12/14/2023 107  96 - 112 mEq/L Final   CO2 12/14/2023  26  19 - 32 mEq/L Final   Glucose, Bld 12/14/2023 110 (H)  70 - 99 mg/dL Final   BUN 98/86/7974 45 (H)  6 - 23 mg/dL Final   Creatinine, Ser 12/14/2023 5.98 (HH)  0.40 - 1.50 mg/dL Final   Total Bilirubin 12/14/2023 0.9  0.2 - 1.2 mg/dL Final   Alkaline Phosphatase 12/14/2023 171 (H)  39 - 117 U/L Final   AST 12/14/2023 37  0 - 37 U/L Final   ALT 12/14/2023 39  0 - 53 U/L Final   Total Protein 12/14/2023 6.6  6.0 - 8.3 g/dL Final   Albumin  12/14/2023 3.3 (L)  3.5 - 5.2 g/dL Final   GFR 98/86/7974 10.23 (LL)  >60.00 mL/min Final   Calcium  12/14/2023 9.3  8.4 - 10.5 mg/dL Final   Color, Urine 98/86/7974 YELLOW  Yellow;Lt. Yellow;Straw;Dark Yellow;Amber;Green;Red;Brown Final   APPearance 12/14/2023 CLEAR  Clear;Turbid;Slightly Cloudy;Cloudy Final   Specific Gravity, Urine 12/14/2023 1.020  1.000 - 1.030 Final   pH 12/14/2023 6.5  5.0 - 8.0 Final   Total Protein, Urine 12/14/2023 >=300 (A)  Negative Final   Urine Glucose 12/14/2023 NEGATIVE  Negative Final   Ketones, ur 12/14/2023 NEGATIVE  Negative Final   Bilirubin Urine 12/14/2023 NEGATIVE  Negative Final   Hgb urine dipstick 12/14/2023 SMALL (A)  Negative Final   Urobilinogen, UA 12/14/2023 0.2  0.0 - 1.0 Final   Leukocytes,Ua 12/14/2023 NEGATIVE  Negative Final   Nitrite 12/14/2023 NEGATIVE  Negative Final   WBC, UA 12/14/2023 0-2/hpf  0-2/hpf Final   RBC / HPF 12/14/2023 3-6/hpf (A)  0-2/hpf Final   Squamous Epithelial / HPF 12/14/2023 Rare(0-4/hpf)  Rare(0-4/hpf) Final  Office Visit on 12/01/2023  Component Date Value Ref Range Status   WBC 12/01/2023 5.8  4.0 - 10.5 K/uL Final   RBC 12/01/2023 3.63 (L)  4.22 - 5.81 Mil/uL Final   Hemoglobin 12/01/2023 12.6 (L)  13.0 - 17.0 g/dL Final   HCT 87/68/7975 36.1 (L)  39.0 - 52.0 % Final   MCV 12/01/2023 99.6  78.0 - 100.0 fl Final   MCHC 12/01/2023 34.8  30.0 - 36.0 g/dL Final   RDW 87/68/7975 14.9  11.5 - 15.5 % Final   Platelets 12/01/2023 67.0 (L)  150.0 - 400.0 K/uL Final    Neutrophils Relative % 12/01/2023 56.2  43.0 - 77.0 % Final   Lymphocytes Relative 12/01/2023 23.7  12.0 - 46.0 % Final   Monocytes Relative 12/01/2023 13.0 (H)  3.0 - 12.0 % Final   Eosinophils Relative 12/01/2023 6.2 (H)  0.0 - 5.0 % Final   Basophils Relative 12/01/2023 0.9  0.0 - 3.0 % Final   Neutro Abs 12/01/2023 3.2  1.4 - 7.7 K/uL Final   Lymphs Abs 12/01/2023 1.4  0.7 - 4.0 K/uL Final   Monocytes Absolute 12/01/2023 0.8  0.1 - 1.0 K/uL Final   Eosinophils  Absolute 12/01/2023 0.4  0.0 - 0.7 K/uL Final   Basophils Absolute 12/01/2023 0.0  0.0 - 0.1 K/uL Final   Sodium 12/01/2023 140  135 - 145 mEq/L Final   Potassium 12/01/2023 3.5  3.5 - 5.1 mEq/L Final   Chloride 12/01/2023 104  96 - 112 mEq/L Final   CO2 12/01/2023 25  19 - 32 mEq/L Final   Glucose, Bld 12/01/2023 96  70 - 99 mg/dL Final   BUN 87/68/7975 50 (H)  6 - 23 mg/dL Final   Creatinine, Ser 12/01/2023 5.78 (HH)  0.40 - 1.50 mg/dL Final   Total Bilirubin 12/01/2023 0.8  0.2 - 1.2 mg/dL Final   Alkaline Phosphatase 12/01/2023 178 (H)  39 - 117 U/L Final   AST 12/01/2023 34  0 - 37 U/L Final   ALT 12/01/2023 44  0 - 53 U/L Final   Total Protein 12/01/2023 6.7  6.0 - 8.3 g/dL Final   Albumin  12/01/2023 3.5  3.5 - 5.2 g/dL Final   GFR 87/68/7975 10.66 (LL)  >60.00 mL/min Final   Calcium  12/01/2023 9.3  8.4 - 10.5 mg/dL Final   TSH W/REFLEX TO FT4 12/01/2023 3.83  0.40 - 4.50 mIU/L Final   Color, Urine 12/01/2023 YELLOW  Yellow;Lt. Yellow;Straw;Dark Yellow;Amber;Green;Red;Brown Final   APPearance 12/01/2023 CLEAR  Clear;Turbid;Slightly Cloudy;Cloudy Final   Specific Gravity, Urine 12/01/2023 1.015  1.000 - 1.030 Final   pH 12/01/2023 6.0  5.0 - 8.0 Final   Total Protein, Urine 12/01/2023 100 (A)  Negative Final   Urine Glucose 12/01/2023 250 (A)  Negative Final   Ketones, ur 12/01/2023 NEGATIVE  Negative Final   Bilirubin Urine 12/01/2023 NEGATIVE  Negative Final   Hgb urine dipstick 12/01/2023 SMALL (A)  Negative Final    Urobilinogen, UA 12/01/2023 0.2  0.0 - 1.0 Final   Leukocytes,Ua 12/01/2023 NEGATIVE  Negative Final   Nitrite 12/01/2023 NEGATIVE  Negative Final   WBC, UA 12/01/2023 0-2/hpf  0-2/hpf Final   RBC / HPF 12/01/2023 0-2/hpf  0-2/hpf Final   Squamous Epithelial / HPF 12/01/2023 Rare(0-4/hpf)  Rare(0-4/hpf) Final   POC Glucose 12/01/2023 116 (A)  70 - 99 mg/dl Final  There may be more visits with results that are not included.  No image results found. VAS US  DUPLEX DIALYSIS ACCESS (AVF, AVG) Result Date: 04/18/2024 DIALYSIS ACCESS Patient Name:  Nixon IT[facility]Y [facility]  Date of Exam:   04/18/2024 Medical Rec #: [id number]            Accession #:    [id number] Date of Birth: 03/04/1971            Patient Gender: M Patient Age:   75 years Exam Location:  [address] Procedure:      VAS US  DUPLEX DIALYSIS ACCESS (AVF, AVG) Referring Phys: JOSHUA ROBINS --------------------------------------------------------------------------------  Reason for Exam: Routine follow up. Access Site: Left Upper Extremity. Access Type: Radial-cephalic AVF. Comparison Study: No prior study Performing Technologist: Rosaline Fujisawa MHA, RDMS, RVT, RDCS  Examination Guidelines: A complete evaluation includes B-mode imaging, spectral Doppler, color Doppler, and power Doppler as needed of all accessible portions of each vessel. Unilateral testing is considered an integral part of a complete examination. Limited examinations for reoccurring indications may be performed as noted.  Findings: +--------------------+----------+-----------------+--------+ AVF                 PSV (cm/s)Flow Vol (mL/min)Comments +--------------------+----------+-----------------+--------+ Native artery inflow   187           874                +--------------------+----------+-----------------+--------+  AVF Anastomosis        549                              +--------------------+----------+-----------------+--------+   +------------+---------+------------+----------+-------------------------------+ OUTFLOW VEIN   PSV     Diameter  Depth (cm)           Describe                          (cm/s)      (cm)                                              +------------+---------+------------+----------+-------------------------------+ Prox UA        86        0.69       0.83     competing branch measuring                                                           0.22cm              +------------+---------+------------+----------+-------------------------------+ Mid UA         129       0.67       0.40     competing branch measuring                                                           0.18cm              +------------+---------+------------+----------+-------------------------------+ Dist UA        54        0.84       0.48                                   +------------+---------+------------+----------+-------------------------------+ AC Fossa       82        0.87       0.37     competing branch measuring                                                           0.57cm              +------------+---------+------------+----------+-------------------------------+ Prox Forearm   148       0.85       0.58     competing branch measuring  0.2cm              +------------+---------+------------+----------+-------------------------------+ Mid Forearm    112       0.91       0.46                                   +------------+---------+------------+----------+-------------------------------+ Dist Forearm   206       0.91       0.21                                   +------------+---------+------------+----------+-------------------------------+   Summary: Patent arteriovenous fistula. Volume flow= 874 mL/min. Arteriovenous fistula-Velocities less than 100cm/s noted. Multiple competing branches as noted above;  largest measuring 0.57cm at [facility] fossa. *See table(s) above for measurements and observations.  Diagnosing physician: Gaile New MD Electronically signed by Gaile New MD on 04/18/2024 at 3:39:42 PM.   --------------------------------------------------------------------------------   Final          ASSESSMENT & PLAN   Assessment & Plan Morbid obesity (HCC) Morbid obesity (BMI 50.4) is the primary driver of the patient's OSA (requiring CPAP), advanced fatty liver disease (MASH), prediabetes, ESRD, and severe cardiovascular disease. Weight loss is the only intervention proven to modify the trajectory of these conditions. The severity and multiplicity of comorbidities confer high risk for morbidity and mortality, making aggressive, evidence-based weight management medically necessary.  Patient has participated in medically supervised weight loss programs (including low-carb and Mediterranean diets, exercise regimens, and behavioral counseling) for >12 months with <10% sustained weight loss. Not a surgical candidate due to comorbidities/polypharmacy.  GLP-1 agonist therapy (tirzepatide /semaglutide ) is FDA-approved for chronic weight management in patients with BMI >=30 and OSA, both of which are present. The patient previously achieved >25 lbs weight loss on Mounjaro  2.5 mg, and nephrology supports resumption at a low dose with slow titration. Close monitoring for GI side effects and volume status will be maintained.  Morbid obesity has led to significant impairment in daily activities, reliance on family for transportation, and inability to work, contributing to financial hardship and reduced quality of life." Obstructive sleep apnea syndrome Morbid obesity (BMI 50.4) is the primary driver of the patient's OSA (requiring CPAP), advanced fatty liver disease (MASH), prediabetes, ESRD, and severe cardiovascular disease. Weight loss is the only intervention proven to modify the trajectory of these  conditions. The severity and multiplicity of comorbidities confer high risk for morbidity and mortality, making aggressive, evidence-based weight management medically necessary. Chronic nausea Cyclical vomiting syndrome not associated with migraine Suspect due to chronic stress gastritis and obesity/diet related gastritis.  Weight loss likely to improve both.  Voquezna  antacid likely to help. Dermatitis He experiences burning and itching in his fingers and hands, possibly related to fluid shifts during dialysis. Suspecting a reaction related to dialysis, he suggests a dermatological evaluation to address skin issues, including moles under the arm. He will be referred to dermatology for evaluation of pruritus and skin lesions. Dysuria He reports a burning sensation after urination, which could indicate a urinary tract infection or kidney stones. A urinalysis will be performed to investigate further. Gastroesophageal reflux disease with esophagitis and hemorrhage He experiences persistent heartburn and vomiting despite being on the highest dose of pantoprazole . Suspecting an underlying issue such as an ulcer that is not healing, he qualifies for a new heartburn medication due recent hx  bleeding ulcers and ongoing suspected gastroesophagitis with failure of twice daily pantoprazole  40 mg to control. Samples of a new heartburn medication Voquezna  have been provided, which is expected to be more effective. He plans to discontinue pantoprazole , start the new medication, and refer to gastroenterology for further evaluation. A message will be sent to Dr. Federico Cassis for follow-up. Low serum total protein level He reports conflicting information regarding protein levels from different healthcare providers. The dialysis nurse indicated the protein level should be 2.5, but it was 2.4. Previous total protein levels were normal in April. He sides with Dr. Tobie, indicating the low protein level is not a significant  concern and does not require intervention. The kidney team will manage protein levels. Hypertension, unspecified type His blood pressure is running slightly low, and the kidney team suggests reducing blood pressure medication. He agrees with this assessment and recommends stopping metoprolol , which was previously prescribed. He will stop metoprolol  and discuss medication changes with the kidney doctor. Disability affecting daily living He is experiencing difficulties with disability claims and insurance coverage for medications. We  supports the disability claim and plans to assist with navigating insurance appeals. we discussed the possibility of coordinating with social workers to assist with disability claims. Coordination with social workers will be arranged, and an appeal letter for medication coverage will be written.    Social Determinants / Disability Needs:         Patient is a 53 year old male with end-stage renal disease (ESRD) on hemodialysis three times per week, morbid obesity, advanced cardiovascular disease, and multiple other serious comorbidities.         He is unable to maintain employment due to the time requirements and physical effects of dialysis (fatigue, post-dialysis weakness, transportation needs), as well as functional limitations from morbid obesity and heart failure.         Reports significant financial hardship; has exhausted savings and is unable to meet basic needs.         Family currently provides transportation for dialysis, but this is not sustainable long-term.         Patient reports that his [employer] application has been pending for over a year, despite meeting criteria for disability due to ESRD requiring dialysis (which is a qualifying condition per [employer]).         Strongly recommend urgent social work involvement to assist with:             Expediting disability application/appeal, with supporting medical  documentation             Assistance with Medicaid application, if not already completed             Transportation resources for dialysis and medical appointments             Assistance with prescription drug coverage and medication affordability programs Metabolic dysfunction-associated steatohepatitis (MASH) Significant reason patient need GLP-1 agonist, he had advanced disease with splenomegaly and thrombocytopenia.   ORDER ASSOCIATIONS  #   DIAGNOSIS / CONDITION ICD-10 ENCOUNTER ORDER     ICD-10-CM   1. Morbid obesity (HCC)  E66.01     2. Chronic nausea  R11.0     3. Dermatitis  L30.9 Ambulatory referral to Dermatology    4. Obstructive sleep apnea syndrome  G47.33     5. Dysuria  R30.0 Urinalysis w microscopic + reflex cultur    6. Gastroesophageal reflux disease with esophagitis and hemorrhage  K21.01 Vonoprazan  Fumarate (VOQUEZNA ) 20 MG TABS     Meds ordered this encounter  Medications   Vonoprazan Fumarate  (VOQUEZNA ) 20 MG TABS    Sig: Take 1 tablet by mouth daily at 6 (six) AM.    Dispense:  90 tablet    Refill:  3   Orders Placed This Encounter  Procedures   Urine Culture   Urinalysis w microscopic + reflex cultur   REFLEXIVE URINE CULTURE   Ambulatory referral to Dermatology    Referral Priority:   Routine    Referral Reason:   Specialty Services Required    Number of Visits Requested:   1    This document was synthesized by artificial intelligence (Abridge) using HIPAA-compliant recording of the clinical interaction;   We discussed the use of AI scribe software for clinical note transcription with the patient, who gave verbal consent to proceed. additional Info: This encounter employed state-of-the-art, real-time, collaborative documentation. The patient actively reviewed and assisted in updating their electronic medical record on a shared screen, ensuring transparency and facilitating joint problem-solving for the problem list, overview, and plan. This approach  promotes accurate, informed care. The treatment plan was discussed and reviewed in detail, including medication safety, potential side effects, and all patient questions. We confirmed understanding and comfort with the plan. Follow-up instructions were established, including contacting the office for any concerns, returning if symptoms worsen, persist, or new symptoms develop, and precautions for potential emergency department visits.

## 2024-06-16 NOTE — Patient Instructions (Addendum)
 Use two sprays of flonase  in each nostril twice per day  right after, use astelin  spray two sprays each nostril twice per day  Ayr gel: Apply a pea sized amount up to 4 times daily -- especially before CPAP use -- just to inside of each nostril like above, then pinch your nose for 10 seconds. Use this consistently.       Aureliano Med Nasal Saline Rinse - use daily - start nasal saline rinses with NeilMed Bottle available over the counter    Nasal Saline Irrigation instructions: If you choose to make your own salt water solution, You will need: Salt (kosher, canning, or pickling salt) Baking soda Nasal irrigation bottle (i.e. Aureliano Med Sinus Rinse) Measuring spoon ( teaspoon) Distilled / boiled water   Mix solution Mix 1 teaspoon of salt, 1/2 teaspoon of baking soda and 1 cup of water into irrigation bottle ** May use saline packet instead of homemade recipe for this step if you prefer If medicine was prescribed to be mixed with solution, place this into bottle Examples 2 inches of 2% mupirocin ointment Budesonide solution Position your head: Lean over sink (about 45 degrees) Rotate head (about 45 degrees) so that one nostril is above the other Irrigate Insert tip of irrigation bottle into upper nostril so it forms a comfortable seal Irrigate while breathing through your mouth May remove the straw from the bottle in order to irrigate the entire solution (important if medicine was added) Exhale through nose when finished and blow nose as necessary  Repeat on opposite side with other 1/2 of solution (120 mL) or remake solution if all 240 mL was used on first side Wash irrigation bottle regularly, replace every 3 months

## 2024-06-17 DIAGNOSIS — N186 End stage renal disease: Secondary | ICD-10-CM | POA: Diagnosis not present

## 2024-06-17 DIAGNOSIS — R52 Pain, unspecified: Secondary | ICD-10-CM | POA: Diagnosis not present

## 2024-06-17 DIAGNOSIS — N2581 Secondary hyperparathyroidism of renal origin: Secondary | ICD-10-CM | POA: Diagnosis not present

## 2024-06-17 DIAGNOSIS — Z992 Dependence on renal dialysis: Secondary | ICD-10-CM | POA: Diagnosis not present

## 2024-06-18 ENCOUNTER — Other Ambulatory Visit (HOSPITAL_BASED_OUTPATIENT_CLINIC_OR_DEPARTMENT_OTHER): Payer: Self-pay

## 2024-06-18 ENCOUNTER — Ambulatory Visit: Payer: Self-pay | Admitting: Internal Medicine

## 2024-06-18 MED ORDER — OZEMPIC (0.25 OR 0.5 MG/DOSE) 2 MG/3ML ~~LOC~~ SOPN
0.2500 mg | PEN_INJECTOR | SUBCUTANEOUS | 0 refills | Status: DC
  Filled 2024-06-18: qty 3, 28d supply, fill #0

## 2024-06-18 MED ORDER — TIRZEPATIDE-WEIGHT MANAGEMENT 2.5 MG/0.5ML ~~LOC~~ SOLN
2.5000 mg | SUBCUTANEOUS | 0 refills | Status: DC
  Filled 2024-06-18: qty 2, 28d supply, fill #0

## 2024-06-18 MED ORDER — WEGOVY 0.25 MG/0.5ML ~~LOC~~ SOAJ
0.2500 mg | SUBCUTANEOUS | 0 refills | Status: DC
  Filled 2024-06-18 – 2024-07-13 (×3): qty 2, 28d supply, fill #0

## 2024-06-18 MED ORDER — TIRZEPATIDE 2.5 MG/0.5ML ~~LOC~~ SOAJ
2.5000 mg | SUBCUTANEOUS | 0 refills | Status: DC
  Filled 2024-06-18: qty 2, 28d supply, fill #0

## 2024-06-18 MED ORDER — TIRZEPATIDE-WEIGHT MANAGEMENT 2.5 MG/0.5ML ~~LOC~~ SOAJ
2.5000 mg | SUBCUTANEOUS | 11 refills | Status: DC
  Filled 2024-06-18: qty 2, 28d supply, fill #0

## 2024-06-18 NOTE — Addendum Note (Signed)
 Addended by: Luwanda Starr G on: 06/18/2024 10:22 AM   Modules accepted: Orders

## 2024-06-18 NOTE — Assessment & Plan Note (Signed)
 He is experiencing difficulties with disability claims and insurance coverage for medications. We  supports the disability claim and plans to assist with navigating insurance appeals. we discussed the possibility of coordinating with social workers to assist with disability claims. Coordination with social workers will be arranged, and an appeal letter for medication coverage will be written.    Social Determinants / Disability Needs:         Patient is a 52 year old male with end-stage renal disease (ESRD) on hemodialysis three times per week, morbid obesity, advanced cardiovascular disease, and multiple other serious comorbidities.         He is unable to maintain employment due to the time requirements and physical effects of dialysis (fatigue, post-dialysis weakness, transportation needs), as well as functional limitations from morbid obesity and heart failure.         Reports significant financial hardship; has exhausted savings and is unable to meet basic needs.         Family currently provides transportation for dialysis, but this is not sustainable long-term.         Patient reports that his [employer] application has been pending for over a year, despite meeting criteria for disability due to ESRD requiring dialysis (which is a qualifying condition per [employer]).         Strongly recommend urgent social work involvement to assist with:             Expediting disability application/appeal, with supporting medical documentation             Assistance with Medicaid application, if not already completed             Transportation resources for dialysis and medical appointments             Assistance with prescription drug coverage and medication affordability programs

## 2024-06-18 NOTE — Assessment & Plan Note (Signed)
 His blood pressure is running slightly low, and the kidney team suggests reducing blood pressure medication. He agrees with this assessment and recommends stopping metoprolol , which was previously prescribed. He will stop metoprolol  and discuss medication changes with the kidney doctor.

## 2024-06-18 NOTE — Assessment & Plan Note (Signed)
 Significant reason patient need GLP-1 agonist, he had advanced disease with splenomegaly and thrombocytopenia.

## 2024-06-18 NOTE — Assessment & Plan Note (Signed)
 Morbid obesity (BMI 50.4) is the primary driver of the patient's OSA (requiring CPAP), advanced fatty liver disease (MASH), prediabetes, ESRD, and severe cardiovascular disease. Weight loss is the only intervention proven to modify the trajectory of these conditions. The severity and multiplicity of comorbidities confer high risk for morbidity and mortality, making aggressive, evidence-based weight management medically necessary.  Patient has participated in medically supervised weight loss programs (including low-carb and Mediterranean diets, exercise regimens, and behavioral counseling) for >12 months with <10% sustained weight loss. Not a surgical candidate due to comorbidities/polypharmacy.  GLP-1 agonist therapy (tirzepatide /semaglutide ) is FDA-approved for chronic weight management in patients with BMI >=30 and OSA, both of which are present. The patient previously achieved >25 lbs weight loss on Mounjaro  2.5 mg, and nephrology supports resumption at a low dose with slow titration. Close monitoring for GI side effects and volume status will be maintained.  Morbid obesity has led to significant impairment in daily activities, reliance on family for transportation, and inability to work, contributing to financial hardship and reduced quality of life."

## 2024-06-18 NOTE — Assessment & Plan Note (Signed)
 Suspect due to chronic stress gastritis and obesity/diet related gastritis.  Weight loss likely to improve both.  Voquezna  antacid likely to help.

## 2024-06-18 NOTE — Assessment & Plan Note (Signed)
 Morbid obesity (BMI 50.4) is the primary driver of the patient's OSA (requiring CPAP), advanced fatty liver disease (MASH), prediabetes, ESRD, and severe cardiovascular disease. Weight loss is the only intervention proven to modify the trajectory of these conditions. The severity and multiplicity of comorbidities confer high risk for morbidity and mortality, making aggressive, evidence-based weight management medically necessary.

## 2024-06-19 LAB — URINE CULTURE
MICRO NUMBER:: 16716877
SPECIMEN QUALITY:: ADEQUATE

## 2024-06-19 LAB — URINALYSIS W MICROSCOPIC + REFLEX CULTURE
Bilirubin Urine: NEGATIVE
Glucose, UA: NEGATIVE
Hyaline Cast: NONE SEEN /LPF
Nitrites, Initial: NEGATIVE
RBC / HPF: NONE SEEN /HPF (ref 0–2)
Specific Gravity, Urine: 1.019 (ref 1.001–1.035)
WBC, UA: >=60 /HPF — AB (ref 0–5)
pH: 5.5 (ref 5.0–8.0)

## 2024-06-19 LAB — CULTURE INDICATED

## 2024-06-20 ENCOUNTER — Telehealth: Payer: Self-pay

## 2024-06-20 ENCOUNTER — Other Ambulatory Visit (HOSPITAL_COMMUNITY): Payer: Self-pay

## 2024-06-20 DIAGNOSIS — Z992 Dependence on renal dialysis: Secondary | ICD-10-CM | POA: Diagnosis not present

## 2024-06-20 DIAGNOSIS — L299 Pruritus, unspecified: Secondary | ICD-10-CM | POA: Diagnosis not present

## 2024-06-20 DIAGNOSIS — N2581 Secondary hyperparathyroidism of renal origin: Secondary | ICD-10-CM | POA: Diagnosis not present

## 2024-06-20 DIAGNOSIS — N186 End stage renal disease: Secondary | ICD-10-CM | POA: Diagnosis not present

## 2024-06-20 DIAGNOSIS — R52 Pain, unspecified: Secondary | ICD-10-CM | POA: Diagnosis not present

## 2024-06-20 NOTE — Telephone Encounter (Signed)
 Pharmacy Patient Advocate Encounter   Received notification from Onbase that prior authorization for Wegovy  0.25MG /0.5ML auto-injectors is required/requested.   Insurance verification completed.   The patient is insured through [facility] .   Per test claim: PA required; PA submitted to above mentioned insurance via CoverMyMeds Key/confirmation #/EOC [id number] Status is pending

## 2024-06-20 NOTE — Telephone Encounter (Signed)
 Spoke w/pt and reviewed notes from Dr. Jesus. Patient verbalized understanding. No further questions at this time.

## 2024-06-20 NOTE — Telephone Encounter (Signed)
 Pharmacy Patient Advocate Encounter  Received notification from [facility] that Prior Authorization for Wegovy  0.25MG /0.5ML auto-injector  has been DENIED.  Full denial letter will be uploaded to the media tab. See denial reason below.    PA #/Case ID/Reference #: [id number]

## 2024-06-21 ENCOUNTER — Other Ambulatory Visit: Payer: Self-pay | Admitting: Nurse Practitioner

## 2024-06-21 ENCOUNTER — Other Ambulatory Visit (HOSPITAL_COMMUNITY): Payer: Self-pay

## 2024-06-21 DIAGNOSIS — K7581 Nonalcoholic steatohepatitis (NASH): Secondary | ICD-10-CM

## 2024-06-21 DIAGNOSIS — K7402 Hepatic fibrosis, advanced fibrosis: Secondary | ICD-10-CM

## 2024-06-21 NOTE — Telephone Encounter (Signed)
 Spoke with pt via phone about option for weight loss shot pt states he seen kidney dr today she is going to be sending provider Dr Jesus some notes to go with PA for the weight loss shot to see if we could get it approved.

## 2024-06-22 ENCOUNTER — Other Ambulatory Visit (HOSPITAL_BASED_OUTPATIENT_CLINIC_OR_DEPARTMENT_OTHER): Payer: Self-pay

## 2024-06-22 DIAGNOSIS — Z992 Dependence on renal dialysis: Secondary | ICD-10-CM | POA: Diagnosis not present

## 2024-06-22 DIAGNOSIS — R52 Pain, unspecified: Secondary | ICD-10-CM | POA: Diagnosis not present

## 2024-06-22 DIAGNOSIS — L299 Pruritus, unspecified: Secondary | ICD-10-CM | POA: Diagnosis not present

## 2024-06-22 DIAGNOSIS — N186 End stage renal disease: Secondary | ICD-10-CM | POA: Diagnosis not present

## 2024-06-22 DIAGNOSIS — N2581 Secondary hyperparathyroidism of renal origin: Secondary | ICD-10-CM | POA: Diagnosis not present

## 2024-06-23 ENCOUNTER — Other Ambulatory Visit (HOSPITAL_COMMUNITY): Payer: Self-pay

## 2024-06-23 ENCOUNTER — Other Ambulatory Visit (HOSPITAL_BASED_OUTPATIENT_CLINIC_OR_DEPARTMENT_OTHER): Payer: Self-pay

## 2024-06-23 ENCOUNTER — Ambulatory Visit: Attending: Vascular Surgery | Admitting: Physician Assistant

## 2024-06-23 VITALS — BP 129/70 | HR 83 | Temp 98.6°F | Wt 361.5 lb

## 2024-06-23 DIAGNOSIS — N186 End stage renal disease: Secondary | ICD-10-CM

## 2024-06-23 NOTE — Progress Notes (Signed)
 POST OPERATIVE OFFICE NOTE    CC:  F/u for surgery  HPI:  This is a 52 y.o. male who is s/p superficialization of left RC AVF on 05/06/2024 by Dr. Sheree.  His original fistula was created 01/11/2024 by Dr. Lanis  Pt was seen 06/07/2024 at which time he had a small scab over the incision.  He is brought back today to check incision.  Pt states he does not have pain/numbness in the left hand.    The pt is on dialysis M/W/F at [facility] location.   Allergies  Allergen Reactions   Glucophage  [Metformin ] Other (See Comments)    Pt told by his nephrologist not to take this medication due to his kidney function.   Vibra -Tab [Doxycycline ] Shortness Of Breath   Chlorhexidine  Itching   Chlorhexidine  Gluconate Itching and Other (See Comments)    burning   Augmentin [Amoxicillin-Pot Clavulanate] Itching   Imdur  [Isosorbide  Dinitrate] Diarrhea   Tape Rash   Valacyclovir  Itching    Current Outpatient Medications  Medication Sig Dispense Refill   allopurinol  (ZYLOPRIM ) 100 MG tablet TAKE 1 TABLET BY MOUTH EVERY DAY 90 tablet 3   amitriptyline  (ELAVIL ) 25 MG tablet TAKE 1 TABLET BY MOUTH EVERYDAY AT BEDTIME 30 tablet 0   azelastine  (ASTELIN ) 0.1 % nasal spray Place 2 sprays into both nostrils 2 (two) times daily. Use in each nostril as directed 30 mL 12   B Complex-C-Folic Acid  (DIALYVITE TABLET) TABS Take 1 tablet by mouth daily.     Cholecalciferol (VITAMIN D3) 125 MCG (5000 UT) CAPS Take 1 capsule (5,000 Units total) by mouth daily at 12 noon. 90 capsule 1   Cyanocobalamin  (VITAMIN B-12) 5000 MCG SUBL Place 5,000 mcg under the tongue daily.     diltiazem  (CARDIZEM  SR) 120 MG 12 hr capsule Take 120 mg by mouth 2 (two) times daily.     ELIQUIS  5 MG TABS tablet TAKE 1 TABLET(5 MG) BY MOUTH TWICE DAILY 180 tablet 1   Evolocumab  (REPATHA ) 140 MG/ML SOSY Inject 140 mg into the skin every 14 (fourteen) days.     iron sucrose in sodium chloride  0.9 % 100 mL Iron Sucrose (Venofer)     ketoconazole   (NIZORAL ) 2 % cream APPLY TO AFFECTED AREA TWICE A DAY 60 g 0   lanthanum (FOSRENOL) 1000 MG chewable tablet Chew 1,000 mg by mouth 3 (three) times daily with meals.     Methoxy PEG-Epoetin Beta (MIRCERA IJ) 75 mcg.     montelukast  (SINGULAIR ) 10 MG tablet TAKE 1 TABLET BY MOUTH EVERY DAY 90 tablet 1   nitroGLYCERIN  (NITROSTAT ) 0.4 MG SL tablet Place 1 tablet (0.4 mg total) under the tongue every 5 (five) minutes as needed for chest pain. 25 tablet 6   ondansetron  (ZOFRAN -ODT) 4 MG disintegrating tablet Take 1 tablet (4 mg total) by mouth every 8 (eight) hours as needed for nausea or vomiting. 30 tablet 2   oxyCODONE -acetaminophen  (PERCOCET) 5-325 MG tablet Take 1 tablet by mouth every 6 (six) hours as needed for severe pain (pain score 7-10). 8 tablet 0   Semaglutide ,0.25 or 0.5MG /DOS, (OZEMPIC , 0.25 OR 0.5 MG/DOSE,) 2 MG/3ML SOPN Inject 0.25 mg into the skin once a week. 3 mL 0   Semaglutide -Weight Management (WEGOVY ) 0.25 MG/0.5ML SOAJ Inject 0.25 mg into the skin once a week. 2 mL 0   sucralfate  (CARAFATE ) 1 g tablet Take 1 tablet (1 g total) by mouth at bedtime. (Patient taking differently: Take 1 g by mouth at bedtime as needed (Stomach  pain).) 30 tablet 1   tirzepatide  (MOUNJARO ) 2.5 MG/0.5ML Pen Inject 2.5 mg into the skin once a week. 2 mL 0   tirzepatide  (ZEPBOUND ) 2.5 MG/0.5ML Pen Inject 2.5 mg into the skin once a week. 2 mL 11   Vonoprazan Fumarate  (VOQUEZNA ) 20 MG TABS Take 1 tablet by mouth daily at 6 (six) AM. 90 tablet 3   No current facility-administered medications for this visit.   Facility-Administered Medications Ordered in Other Visits  Medication Dose Route Frequency Provider Last Rate Last Admin   technetium tetrofosmin  (TC-MYOVIEW ) injection 29.8 millicurie  29.8 millicurie Intravenous Once PRN Hilty, Vinie BROCKS, MD         ROS:  See HPI  Physical Exam:  Today's Vitals   06/23/24 1042  BP: 129/70  Pulse: 83  Temp: 98.6 F (37 C)  TempSrc: Temporal  Weight: (!)  361 lb 8 oz (164 kg)  PainSc: 0-No pain   Body mass index is 50.42 kg/m.   Incision:  all are healing nicely.  Mid arm incision with small scab but much improved from last visit  Extremities:   There is a palpable left radial pulse.   Motor and sensory are in tact.   There is a thrill/bruit present.  Access is  easily palpable    Assessment/Plan:  This is a 53 y.o. male who is s/p: superficialization of left RC AVF on 05/06/2024 by Dr. Sheree.  His original fistula was created 01/11/2024 by Dr. Lanis  -the pt does not have evidence of steal. -pt's access can be used in 3 weeks to give the scab time to fully heal.  It has improved from last visit.  Do not feel it is attached to fistula and just on the skin over fistula.  Discussed if he ever develops scabs on the fistula after they have used it, that would need evaluation bc if those scabs bleed, it can be life threatening.  -if pt has tunneled catheter, this can be removed at the discretion of the dialysis center once the pt's access has been successfully cannulated to their satisfaction.  -discussed with pt that access does not last forever and will need intervention or even new access at some point.  -the pt will follow up as needed.    Lucie Apt, [facility] [phone]  Clinic MD:  Lanis

## 2024-06-24 ENCOUNTER — Other Ambulatory Visit (HOSPITAL_BASED_OUTPATIENT_CLINIC_OR_DEPARTMENT_OTHER): Payer: Self-pay

## 2024-06-24 DIAGNOSIS — N2581 Secondary hyperparathyroidism of renal origin: Secondary | ICD-10-CM | POA: Diagnosis not present

## 2024-06-24 DIAGNOSIS — L299 Pruritus, unspecified: Secondary | ICD-10-CM | POA: Diagnosis not present

## 2024-06-24 DIAGNOSIS — N186 End stage renal disease: Secondary | ICD-10-CM | POA: Diagnosis not present

## 2024-06-24 DIAGNOSIS — R52 Pain, unspecified: Secondary | ICD-10-CM | POA: Diagnosis not present

## 2024-06-24 DIAGNOSIS — Z992 Dependence on renal dialysis: Secondary | ICD-10-CM | POA: Diagnosis not present

## 2024-06-27 DIAGNOSIS — N2581 Secondary hyperparathyroidism of renal origin: Secondary | ICD-10-CM | POA: Diagnosis not present

## 2024-06-27 DIAGNOSIS — N186 End stage renal disease: Secondary | ICD-10-CM | POA: Diagnosis not present

## 2024-06-27 DIAGNOSIS — Z992 Dependence on renal dialysis: Secondary | ICD-10-CM | POA: Diagnosis not present

## 2024-06-29 ENCOUNTER — Ambulatory Visit: Admitting: Internal Medicine

## 2024-06-29 DIAGNOSIS — Z992 Dependence on renal dialysis: Secondary | ICD-10-CM | POA: Diagnosis not present

## 2024-06-29 DIAGNOSIS — N186 End stage renal disease: Secondary | ICD-10-CM | POA: Diagnosis not present

## 2024-06-29 DIAGNOSIS — N2581 Secondary hyperparathyroidism of renal origin: Secondary | ICD-10-CM | POA: Diagnosis not present

## 2024-06-30 DIAGNOSIS — I509 Heart failure, unspecified: Secondary | ICD-10-CM | POA: Diagnosis not present

## 2024-06-30 DIAGNOSIS — Z992 Dependence on renal dialysis: Secondary | ICD-10-CM | POA: Diagnosis not present

## 2024-06-30 DIAGNOSIS — N186 End stage renal disease: Secondary | ICD-10-CM | POA: Diagnosis not present

## 2024-07-01 DIAGNOSIS — N186 End stage renal disease: Secondary | ICD-10-CM | POA: Diagnosis not present

## 2024-07-01 DIAGNOSIS — N2581 Secondary hyperparathyroidism of renal origin: Secondary | ICD-10-CM | POA: Diagnosis not present

## 2024-07-01 DIAGNOSIS — Z992 Dependence on renal dialysis: Secondary | ICD-10-CM | POA: Diagnosis not present

## 2024-07-04 ENCOUNTER — Ambulatory Visit: Payer: Self-pay

## 2024-07-04 ENCOUNTER — Telehealth: Payer: Self-pay

## 2024-07-04 ENCOUNTER — Other Ambulatory Visit: Payer: Self-pay | Admitting: Internal Medicine

## 2024-07-04 ENCOUNTER — Other Ambulatory Visit: Payer: Self-pay

## 2024-07-04 DIAGNOSIS — Z992 Dependence on renal dialysis: Secondary | ICD-10-CM | POA: Diagnosis not present

## 2024-07-04 DIAGNOSIS — K2101 Gastro-esophageal reflux disease with esophagitis, with bleeding: Secondary | ICD-10-CM

## 2024-07-04 DIAGNOSIS — N186 End stage renal disease: Secondary | ICD-10-CM | POA: Diagnosis not present

## 2024-07-04 DIAGNOSIS — R52 Pain, unspecified: Secondary | ICD-10-CM | POA: Diagnosis not present

## 2024-07-04 DIAGNOSIS — L299 Pruritus, unspecified: Secondary | ICD-10-CM | POA: Diagnosis not present

## 2024-07-04 DIAGNOSIS — N2581 Secondary hyperparathyroidism of renal origin: Secondary | ICD-10-CM | POA: Diagnosis not present

## 2024-07-04 MED ORDER — VOQUEZNA 20 MG PO TABS: 1.0000 | ORAL_TABLET | Freq: Every day | ORAL | 3 refills | Status: DC

## 2024-07-04 NOTE — Telephone Encounter (Signed)
 Pt has call to get refill on voguezna 20 mg sent to wrong pharmacy [facility]. Also pt 06/16/2024 UTI abx needs to be called in still having burning when using the bathroom. Abx was never called in for pt I did let pt know I will send message to provider to get that sent in.

## 2024-07-04 NOTE — Telephone Encounter (Signed)
 1st attempt to contact patient. No answer. Unable ot leave voicemail. Routing for additional attempts.      Copied from CRM [id number]. Topic: Clinical - Medication Question >> Jul 04, 2024  8:57 AM Sean [name] wrote: Reason for CRM:  Will Dr. Jesus call in any medication for his UTI after his culture? Also, he needs to discuss the medication that Dr. Jesus gave him samples of for his stomach. He does not know the name of the medication. Please call him at [phone] after 3 PM today.

## 2024-07-04 NOTE — Telephone Encounter (Deleted)
 FYI Only or Action Required?: {FYI only or action required:32865}  Patient was last seen in primary care on 06/16/2024 by Jesus Bernardino MATSU, MD.  Called Nurse Triage reporting No chief complaint on file..  Symptoms began {$Time; disease onset (duration):32870}.  Interventions attempted: {Primary Care interventions tried:32867}.  Symptoms are: {COURSE IMPROVING/WORSENING SX HPI:23693}.  Triage Disposition: No disposition on file.  Patient/caregiver understands and will follow disposition?:

## 2024-07-04 NOTE — Telephone Encounter (Signed)
 Attempted to reach the patient, as the caller is not listed in the [facility] No answer at this time, Unable to leave voicemail.

## 2024-07-05 ENCOUNTER — Telehealth: Payer: Self-pay

## 2024-07-05 ENCOUNTER — Other Ambulatory Visit: Payer: Self-pay

## 2024-07-05 ENCOUNTER — Other Ambulatory Visit (HOSPITAL_COMMUNITY): Payer: Self-pay

## 2024-07-05 MED ORDER — SULFAMETHOXAZOLE-TRIMETHOPRIM 800-160 MG PO TABS
1.0000 | ORAL_TABLET | Freq: Every day | ORAL | 0 refills | Status: DC
Start: 2024-07-05 — End: 2024-08-06

## 2024-07-05 MED ORDER — SULFAMETHOXAZOLE-TRIMETHOPRIM 800-160 MG PO TABS
1.0000 | ORAL_TABLET | Freq: Every day | ORAL | 0 refills | Status: DC
Start: 2024-07-05 — End: 2024-07-05

## 2024-07-05 NOTE — Addendum Note (Signed)
 Addended by: Arvle Grabe G on: 07/05/2024 12:27 PM   Modules accepted: Orders

## 2024-07-05 NOTE — Telephone Encounter (Signed)
 Called [facility] and spoke with Nurse Bethann) was advised before sending orders they do not have this antibiotic in their facility so would be unable to complete this course of action for patient

## 2024-07-05 NOTE — Telephone Encounter (Signed)
 Called pt to advise and was unable to reach pt or lvm since vm box not being set up. Sent patient a MyChart message advising and waiting on response from patient.

## 2024-07-05 NOTE — Telephone Encounter (Signed)
 Provider had me to do verbal over the phone for Bactrim  Ds Take once daily for ten days. [facility]. Pt is to get blood work done with dialysis while on ABX.

## 2024-07-05 NOTE — Telephone Encounter (Signed)
 Spoke with pt about sending in Bactrim  DS take once daily for 10 days called  [facility] for verbal order of Bactrium Ds take once daily for 10 days. Also advise pt to follow up blood work while taking abx with Dialysis center. Pt understood well via phone.

## 2024-07-05 NOTE — Telephone Encounter (Signed)
 Copied from CRM #[id number]. Topic: General - Other >> Jul 05, 2024  9:30 AM Mesmerise C wrote: Reason for CRM: Patient returning call advised of message that was sent in myChart patient stated he would like a call back once there's an update.  Nothing needed at this time, awaiting reply on next steps form PCP.

## 2024-07-05 NOTE — Telephone Encounter (Signed)
 Pharmacy Patient Advocate Encounter   Received notification from Physician's Office that prior authorization for VOQUEZNA  20MG  TABS is required/requested.   Insurance verification completed.   The patient is insured through [facility] .   Per test claim:

## 2024-07-05 NOTE — Telephone Encounter (Signed)
 Called and spoke with cheryl from dialysis about if could get abx from [facility] and pt pick up could they give to pt she states she has to ask her boss and give our office call back.

## 2024-07-05 NOTE — Telephone Encounter (Signed)
 Error

## 2024-07-06 DIAGNOSIS — Z992 Dependence on renal dialysis: Secondary | ICD-10-CM | POA: Diagnosis not present

## 2024-07-06 DIAGNOSIS — N186 End stage renal disease: Secondary | ICD-10-CM | POA: Diagnosis not present

## 2024-07-06 DIAGNOSIS — R52 Pain, unspecified: Secondary | ICD-10-CM | POA: Diagnosis not present

## 2024-07-06 DIAGNOSIS — N2581 Secondary hyperparathyroidism of renal origin: Secondary | ICD-10-CM | POA: Diagnosis not present

## 2024-07-06 DIAGNOSIS — L299 Pruritus, unspecified: Secondary | ICD-10-CM | POA: Diagnosis not present

## 2024-07-08 DIAGNOSIS — Z992 Dependence on renal dialysis: Secondary | ICD-10-CM | POA: Diagnosis not present

## 2024-07-08 DIAGNOSIS — N186 End stage renal disease: Secondary | ICD-10-CM | POA: Diagnosis not present

## 2024-07-08 DIAGNOSIS — N2581 Secondary hyperparathyroidism of renal origin: Secondary | ICD-10-CM | POA: Diagnosis not present

## 2024-07-08 DIAGNOSIS — R52 Pain, unspecified: Secondary | ICD-10-CM | POA: Diagnosis not present

## 2024-07-08 DIAGNOSIS — L299 Pruritus, unspecified: Secondary | ICD-10-CM | POA: Diagnosis not present

## 2024-07-09 ENCOUNTER — Other Ambulatory Visit: Payer: Self-pay | Admitting: Internal Medicine

## 2024-07-11 DIAGNOSIS — R52 Pain, unspecified: Secondary | ICD-10-CM | POA: Diagnosis not present

## 2024-07-11 DIAGNOSIS — N2581 Secondary hyperparathyroidism of renal origin: Secondary | ICD-10-CM | POA: Diagnosis not present

## 2024-07-11 DIAGNOSIS — Z992 Dependence on renal dialysis: Secondary | ICD-10-CM | POA: Diagnosis not present

## 2024-07-11 DIAGNOSIS — L299 Pruritus, unspecified: Secondary | ICD-10-CM | POA: Diagnosis not present

## 2024-07-11 DIAGNOSIS — N186 End stage renal disease: Secondary | ICD-10-CM | POA: Diagnosis not present

## 2024-07-13 ENCOUNTER — Other Ambulatory Visit (HOSPITAL_BASED_OUTPATIENT_CLINIC_OR_DEPARTMENT_OTHER): Payer: Self-pay

## 2024-07-13 DIAGNOSIS — L299 Pruritus, unspecified: Secondary | ICD-10-CM | POA: Diagnosis not present

## 2024-07-13 DIAGNOSIS — N186 End stage renal disease: Secondary | ICD-10-CM | POA: Diagnosis not present

## 2024-07-13 DIAGNOSIS — Z992 Dependence on renal dialysis: Secondary | ICD-10-CM | POA: Diagnosis not present

## 2024-07-13 DIAGNOSIS — N2581 Secondary hyperparathyroidism of renal origin: Secondary | ICD-10-CM | POA: Diagnosis not present

## 2024-07-13 DIAGNOSIS — R52 Pain, unspecified: Secondary | ICD-10-CM | POA: Diagnosis not present

## 2024-07-14 ENCOUNTER — Ambulatory Visit (INDEPENDENT_AMBULATORY_CARE_PROVIDER_SITE_OTHER): Admitting: Gastroenterology

## 2024-07-14 ENCOUNTER — Encounter: Payer: Self-pay | Admitting: Gastroenterology

## 2024-07-14 ENCOUNTER — Ambulatory Visit
Admission: RE | Admit: 2024-07-14 | Discharge: 2024-07-14 | Disposition: A | Discharge status: Home / Self Care | Source: Ambulatory Visit | Attending: Nurse Practitioner | Admitting: Nurse Practitioner

## 2024-07-14 VITALS — BP 118/50 | HR 81 | Ht 71.0 in | Wt 364.5 lb

## 2024-07-14 DIAGNOSIS — K219 Gastro-esophageal reflux disease without esophagitis: Secondary | ICD-10-CM | POA: Diagnosis not present

## 2024-07-14 DIAGNOSIS — K7581 Nonalcoholic steatohepatitis (NASH): Secondary | ICD-10-CM | POA: Diagnosis not present

## 2024-07-14 DIAGNOSIS — K7402 Hepatic fibrosis, advanced fibrosis: Secondary | ICD-10-CM

## 2024-07-14 DIAGNOSIS — K5909 Other constipation: Secondary | ICD-10-CM

## 2024-07-14 NOTE — Progress Notes (Signed)
 Chief Complaint: GERD Primary GI Doctor:(previously Dr. Eda) Dr. 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 who was referred to me by Jesus Bernardino MATSU, MD on 03/15/24 for a complaint of elevated LFT's .    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 Patient is currently being seen by [facility]. He saw her for consult 06/2023 and then follow-up 12/2023. Last appt on 06/21/24 with Dawn with discussion to discuss recommendation of GLP-1 and hematoma screening with right upper quadrant ultrasound.  Follow-up in 6 months.  He underwent transjugular biopsy with hepatic venous pressure measurements in July 2024. The quality of the specimen was core however did suggest F3 fibrosis. There was no active hepatitis on the biopsy no evidence of steatosis. Hepatic venogram with no evidence of portal hypertension. Patient has had thrombocytopenia since at least 2012. He also had prior evidence of splenomegaly. He has longstanding mild elevation of alkaline phosphatase and GGT, previous alkaline phosphatase isoenzymes were unrevealing.   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  A bone marrow biopsy done in [place] 2024 revealed 7% plasma cells. Furthermore, since his last visit, the patient has had an outside abdominal ultrasound which showed evidence of fatty liver disease and secondary splenomegaly.  These findings are the likely reason behind his baseline thrombocytopenia.  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   Interval History   Patient presents for evaluation of  uncontrolled GERD. He was taking Pantoprazole  40 mg twice daily which did not control his symptoms. He was given samples of Voquenza 10 mg po daily for few weeks with some improvement increased to 20mg  po daily with better response.  He reports it was sent to [facility] and would cost $50. He is not sure he can afford that long term. He was having pyrosis, regurgitation and belching. He is still taking sucralfate  1gram po daily at bedtime due to history of kidney issues.      He has history of chronic constipation. He is currently using OTC Miralax  and stool softeners prn. He has intermittent issues with hemorrhoids that bleed. He notes alittle bit of BRB with wiping. He has topical ointments at home, but he has not used them.   Patient on Eliquis .   He has ESRD and managed by Dr. Tobie. Dialysis on Tues/Thurs/Saturday. Patient states he needs to lose 70 lbs to be able to meet criteria for transplant. Patient is  working with his PCP and potentially starting Mounjaro .   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   Wt Readings from Last 3 Encounters:  07/14/24 (!) 364 lb 8 oz (165.3 kg)  06/23/24 (!) 361 lb 8 oz (164 kg)  06/16/24 (!) 360 lb (163.3 kg)    Past Medical History:  Diagnosis Date   Abnormal liver enzymes    a. Sees a doctor in [place].   Acute renal failure superimposed on stage 5 chronic kidney disease, not on chronic dialysis (HCC) 01/08/2024   AKI (acute kidney injury) (HCC) 06/17/2023   Atrial fibrillation (HCC)  Cardiac cirrhosis    a. possible elevated LFTs/low platelets felt due to cardiac cirrhosis per 2017 admission.   Chronic combined systolic and diastolic CHF (congestive heart failure) (HCC)    Chronic kidney disease, stage 4 (severe) (HCC) 01/04/2016   Lab Results      Component    Value    Date/Time           GFR    29.24 (L)    09/30/2022 02:51 PM           GFR    27.27 (L)     09/24/2022 03:40 PM           GFR    24.10 (L)    09/03/2022 03:45 PM           GFR    18.98 (L)    08/25/2022 12:21 PM           GFR    38.98 (L)    04/24/2022 02:39 PM   Seeing [facility]: Jerrye Katheryn BROCKS, MD  Lastt plan 04/2023: # CKD stage IV    CKD (chronic kidney disease) stage 3, GFR 30-59 ml/min (HCC) 01/04/2016   Lab Results  Component  Value  Date/Time     GFR  29.24 (L)  09/30/2022 02:51 PM     GFR  27.27 (L)  09/24/2022 03:40 PM     GFR  24.10 (L)  09/03/2022 03:45 PM     GFR  18.98 (L)  08/25/2022 12:21 PM     GFR  38.98 (L)  04/24/2022 02:39 PM         CKD (chronic kidney disease), stage II    Stage 4   Congenital heart defect    a. rightward rotation of heart, almost dextrocardia   Coronary artery disease    a. s/p CABGx1 in 12/2015.   Dyspnea    with exertion   Gastric polyps 11/25/2022   Gastritis and gastroduodenitis 11/25/2022          Gastroesophageal reflux disease with esophagitis and hemorrhage 04/04/2021   Hematuria    a. Chronic hx of this, no prior etiology determined through workup per patient.   History of umbilical hernia repair 07/10/2011   History of hernia surgery twice prior   Hypercholesteremia    a. Prev taken off statin due to abnormal liver function.   Hypertension    Incisional hernia 07/10/2011   History of hernia surgery twice prior   Morbid obesity (HCC)    Morbid obesity with BMI of 50.0-59.9, adult (HCC) 01/04/2016   After [place] [facility] doctor is setting him up with a wellness clinic to help with weight losS  He reports not having tried anything for weight loss either diet or or medicine or surgery in the past  We failed to get Wegovy  10/2022      Wt Readings from Last 10 Encounters:  11/13/22  (!) 384 lb 12.8 oz (174.5 kg)  10/13/22  (!) 385 lb (174.6 kg)  09/30/22  (!) 384 lb (174.2 kg)  09/24/22  (!) 382 lb (1   Nausea and vomiting 11/25/2022   OSA on CPAP    Paroxysmal atrial flutter (HCC) 02/12/2016   PONV  (postoperative nausea and vomiting)    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   LIMA to LAD   Sinus bradycardia    Thrombocytopenia (HCC)     Past Surgical History:  Procedure Laterality Date   APPENDECTOMY     AV  FISTULA PLACEMENT Left 01/11/2024   Procedure: LEFT RADIOCEPHALIC ARTERIOVENOUS (AV) FISTULA CREATION;  Surgeon: Lanis Fonda BRAVO, MD;  Location: [facility] OR;  Service: Vascular;  Laterality: Left;   BIOPSY  02/19/2021   Procedure: BIOPSY;  Surgeon: Eda Iha, MD;  Location: [facility] ENDOSCOPY;  Service: Gastroenterology;;   BIOPSY  11/25/2022   Procedure: BIOPSY;  Surgeon: Eda Iha, MD;  Location: [facility] ENDOSCOPY;  Service: Gastroenterology;;   CARDIAC CATHETERIZATION N/A 12/24/2015   Procedure: Right/Left Heart Cath and Coronary Angiography;  Surgeon: Toribio JONELLE Fuel, MD;  Location: [facility] INVASIVE CV LAB;  Service: Cardiovascular;  Laterality: N/A;   CARDIOVERSION N/A 02/14/2016   Procedure: CARDIOVERSION;  Surgeon: Vinie JAYSON Maxcy, MD;  Location: [facility] ENDOSCOPY;  Service: Cardiovascular;  Laterality: N/A;   COLONOSCOPY WITH PROPOFOL  N/A 02/19/2021   Procedure: COLONOSCOPY WITH PROPOFOL ;  Surgeon: Eda Iha, MD;  Location: [facility] ENDOSCOPY;  Service: Gastroenterology;  Laterality: N/A;   CORONARY ARTERY BYPASS GRAFT N/A 12/26/2015   Procedure: Off Pump Coronary Artery Bypass Grafting times one using left internal mammary artery;  Surgeon: Sudie VEAR Laine, MD;  Location: [facility] OR;  Service: Open Heart Surgery;  Laterality: N/A;   ESOPHAGOGASTRODUODENOSCOPY (EGD) WITH PROPOFOL  N/A 02/19/2021   Procedure: ESOPHAGOGASTRODUODENOSCOPY (EGD) WITH PROPOFOL ;  Surgeon: Eda Iha, MD;  Location: [facility] ENDOSCOPY;  Service: Gastroenterology;  Laterality: N/A;   ESOPHAGOGASTRODUODENOSCOPY (EGD) WITH PROPOFOL  N/A 11/25/2022   Procedure: ESOPHAGOGASTRODUODENOSCOPY (EGD) WITH PROPOFOL ;  Surgeon: Eda Iha, MD;  Location: [facility] ENDOSCOPY;  Service: Gastroenterology;  Laterality: N/A;   FISTULA  SUPERFICIALIZATION Left 05/06/2024   Procedure: FISTULA SUPERFICIALIZATION;  Surgeon: Sheree Penne Bruckner, MD;  Location: [facility] OR;  Service: Vascular;  Laterality: Left;   GALLBLADDER SURGERY     HERNIA REPAIR     INSERTION OF DIALYSIS CATHETER Right 01/11/2024   Procedure: INSERTION OF TUNNELED  DIALYSIS CATHETER RIGHT INTERNAL JUGULAR;  Surgeon: Lanis Fonda BRAVO, MD;  Location: [facility] OR;  Service: Vascular;  Laterality: Right;   IR TRANSCATHETER BX  06/26/2023   IR US  GUIDE VASC ACCESS RIGHT  06/26/2023   IR VENOGRAM HEPATIC W HEMODYNAMIC EVALUATION  06/26/2023   LEFT HEART CATH AND CORS/GRAFTS ANGIOGRAPHY N/A 04/15/2017   Procedure: Left Heart Cath and Cors/Grafts Angiography;  Surgeon: Burnard Debby LABOR, MD;  Location: [facility] INVASIVE CV LAB;  Service: Cardiovascular;  Laterality: N/A;   LEFT HEART CATH AND CORS/GRAFTS ANGIOGRAPHY N/A 11/02/2020   Procedure: LEFT HEART CATH AND CORS/GRAFTS ANGIOGRAPHY;  Surgeon: Wonda Sharper, MD;  Location: [facility] INVASIVE CV LAB;  Service: Cardiovascular;  Laterality: N/A;   LIGATION OF COMPETING BRANCHES OF ARTERIOVENOUS FISTULA Left 05/06/2024   Procedure: LIGATION OF COMPETING BRANCHES OF ARTERIOVENOUS FISTULA;  Surgeon: Sheree Penne Bruckner, MD;  Location: [facility] OR;  Service: Vascular;  Laterality: Left;   POLYPECTOMY  02/19/2021   Procedure: POLYPECTOMY;  Surgeon: Eda Iha, MD;  Location: [facility] ENDOSCOPY;  Service: Gastroenterology;;   TEE WITHOUT CARDIOVERSION N/A 12/26/2015   Procedure: TRANSESOPHAGEAL ECHOCARDIOGRAM (TEE);  Surgeon: Sudie VEAR Laine, MD;  Location: [facility] OR;  Service: Open Heart Surgery;  Laterality: N/A;   TEE WITHOUT CARDIOVERSION N/A 02/14/2016   Procedure: TRANSESOPHAGEAL ECHOCARDIOGRAM (TEE);  Surgeon: Vinie JAYSON Maxcy, MD;  Location: [facility] ENDOSCOPY;  Service: Cardiovascular;  Laterality: N/A;    Current Outpatient Medications  Medication Sig Dispense Refill   allopurinol  (ZYLOPRIM ) 100 MG tablet TAKE 1 TABLET BY MOUTH EVERY DAY 90 tablet 3    amitriptyline  (ELAVIL ) 25 MG tablet TAKE 1 TABLET BY MOUTH EVERYDAY AT BEDTIME 30 tablet 0  azelastine  (ASTELIN ) 0.1 % nasal spray Place 2 sprays into both nostrils 2 (two) times daily. Use in each nostril as directed 30 mL 12   B Complex-C-Folic Acid  (DIALYVITE TABLET) TABS Take 1 tablet by mouth daily.     Cyanocobalamin  (VITAMIN B-12) 5000 MCG SUBL Place 5,000 mcg under the tongue daily.     diltiazem  (CARDIZEM  SR) 120 MG 12 hr capsule TAKE 1 CAPSULE BY MOUTH TWICE A DAY 180 capsule 1   ELIQUIS  5 MG TABS tablet TAKE 1 TABLET(5 MG) BY MOUTH TWICE DAILY 180 tablet 1   Evolocumab  (REPATHA ) 140 MG/ML SOSY Inject 140 mg into the skin every 14 (fourteen) days.     folic acid -vitamin b complex-vitamin c-selenium-zinc (DIALYVITE) 3 MG TABS tablet Take 1 tablet by mouth daily.     iron sucrose in sodium chloride  0.9 % 100 mL Iron Sucrose (Venofer)     ketoconazole  (NIZORAL ) 2 % cream APPLY TO AFFECTED AREA TWICE A DAY 60 g 0   lanthanum (FOSRENOL) 1000 MG chewable tablet Chew 1,000 mg by mouth 3 (three) times daily with meals.     losartan  (COZAAR ) 50 MG tablet Take 50 mg by mouth daily.     Methoxy PEG-Epoetin Beta (MIRCERA IJ) 75 mcg.     metoprolol  succinate (TOPROL -XL) 25 MG 24 hr tablet Take 25 mg by mouth daily.     montelukast  (SINGULAIR ) 10 MG tablet TAKE 1 TABLET BY MOUTH EVERY DAY 90 tablet 1   nitroGLYCERIN  (NITROSTAT ) 0.4 MG SL tablet Place 1 tablet (0.4 mg total) under the tongue every 5 (five) minutes as needed for chest pain. 25 tablet 6   ondansetron  (ZOFRAN -ODT) 4 MG disintegrating tablet Take 1 tablet (4 mg total) by mouth every 8 (eight) hours as needed for nausea or vomiting. 30 tablet 2   oxyCODONE -acetaminophen  (PERCOCET) 5-325 MG tablet Take 1 tablet by mouth every 6 (six) hours as needed for severe pain (pain score 7-10). 8 tablet 0   pantoprazole  (PROTONIX ) 40 MG tablet Take 40 mg by mouth 2 (two) times daily.     sucralfate  (CARAFATE ) 1 g tablet Take 1 tablet (1 g total) by  mouth at bedtime. (Patient taking differently: Take 1 g by mouth at bedtime as needed (Stomach pain).) 30 tablet 1   sulfamethoxazole -trimethoprim  (BACTRIM  DS) 800-160 MG tablet Take 1 tablet by mouth daily for 10 days. 10 tablet 0   torsemide  (DEMADEX ) 10 MG tablet Take 10 mg by mouth daily.     VOQUEZNA  20 MG TABS TAKE 1 TABLET BY MOUTH DAILY AT 6 (SIX) AM. 90 tablet 3   tirzepatide  (MOUNJARO ) 2.5 MG/0.5ML Pen Inject 2.5 mg into the skin once a week. (Patient not taking: Reported on 07/14/2024) 2 mL 0   No current facility-administered medications for this visit.   Facility-Administered Medications Ordered in Other Visits  Medication Dose Route Frequency Provider Last Rate Last Admin   technetium tetrofosmin  (TC-MYOVIEW ) injection 29.8 millicurie  29.8 millicurie Intravenous Once PRN Mona Vinie BROCKS, MD        Allergies as of 07/14/2024 - Review Complete 07/14/2024  Allergen Reaction Noted   Glucophage  [metformin ] Other (See Comments) 06/17/2023   Vibra -tab [doxycycline ] Shortness Of Breath 11/15/2021   Chlorhexidine  Itching 02/05/2024   Chlorhexidine  gluconate Itching and Other (See Comments) 01/11/2024   Augmentin [amoxicillin-pot clavulanate] Itching 04/07/2016   Imdur  [isosorbide  dinitrate] Diarrhea 04/16/2017   Tape Rash 04/14/2017   Valacyclovir  Itching 12/30/2022    Family History  Problem Relation Age of Onset  CAD Father        4 stents ~ 54   Diabetes Father    Diabetes Maternal Grandfather    Diabetes Maternal Uncle    Heart attack Other    Hyperlipidemia Other     Review of Systems:    Constitutional: No weight loss, fever, chills, weakness or fatigue HEENT: Eyes: No change in vision               Ears, Nose, Throat:  No change in hearing or congestion Skin: No rash or itching Cardiovascular: No chest pain, chest pressure or palpitations   Respiratory: No SOB or cough Gastrointestinal: See HPI and otherwise negative Genitourinary: No dysuria or change in  urinary frequency Neurological: No headache, dizziness or syncope Musculoskeletal: No new muscle or joint pain Hematologic: No bleeding or bruising Psychiatric: No history of depression or anxiety    Physical Exam:  Vital signs: BP (!) 118/50 (BP Location: Right Arm, Patient Position: Sitting)   Pulse 81   Ht 5' 11 (1.803 m) Comment: height without shoes  Wt (!) 364 lb 8 oz (165.3 kg)   BMI 50.84 kg/m   Constitutional:  Pleasant male appears to be in NAD, Well developed, Well nourished, obese, alert and cooperative Respiratory: Respirations even and unlabored. Lungs clear to auscultation bilaterally.   No wheezes, crackles, or rhonchi.  Cardiovascular: Normal S1, S2. Regular rate and rhythm. Bilateral lower extremity edema +2 with compression stockings Gastrointestinal:  Soft, nondistended, obese, nontender. No rebound or guarding. Normal bowel sounds. No appreciable masses or hepatomegaly. Rectal:  Not performed.  Msk:  Symmetrical without gross deformities.  Neurologic:  Alert and  oriented x4;  grossly normal neurologically. No asterixis  Skin:   Dry and intact without significant lesions or rashes. Psychiatric: Oriented to person, place and time. Demonstrates good judgement and reason without abnormal affect or behaviors.  RELEVANT LABS AND IMAGING: CBC    Latest Ref Rng & Units 05/06/2024    7:26 AM 03/14/2024    2:50 PM 01/18/2024    8:51 AM  CBC  WBC 4.0 - 10.5 K/uL  7.8  7.4   Hemoglobin 13.0 - 17.0 g/dL 88.7  88.9  88.5   Hematocrit 39.0 - 52.0 % 33.0  32.0  33.4   Platelets 150.0 - 400.0 K/uL  89.0  70      CMP     Latest Ref Rng & Units 05/06/2024    7:26 AM 03/14/2024    2:50 PM 01/16/2024    3:10 AM  CMP  Glucose 70 - 99 mg/dL 833  847  843   BUN 6 - 20 mg/dL 38  69  50   Creatinine 0.61 - 1.24 mg/dL 2.49  89.03  3.17   Sodium 135 - 145 mmol/L 135  137  138   Potassium 3.5 - 5.1 mmol/L 3.7  4.3  3.4   Chloride 98 - 111 mmol/L 101  98  103   CO2 19 - 32 mEq/L   22  25   Calcium  8.4 - 10.5 mg/dL  9.2  8.6   Total Protein 6.0 - 8.3 g/dL  6.1    Total Bilirubin 0.2 - 1.2 mg/dL  1.6    Alkaline Phos 39 - 117 U/L  244    AST 0 - 37 U/L  55    ALT 0 - 53 U/L  64       Lab Results  Component Value Date   TSH 2.070 03/14/2024  04/16/23  echo- Left ventricular ejection fraction, by estimation, is 50 to 55%.  Imaging:   01/15/2021  ULTRASOUND HEPATIC ELASTOGRAPHY  Median kPa: 3.7 IQR: 1.0 IQR/Median kPa ratio: 0.3 Data quality: IQR/Median kPa ratio of 0.3 or greater indicates reduced accuracy  BIOPSY RESULTS: 04/10/2023 BONE MARROW, ASPIRATE, CLOT, CORE: -Slightly hypercellular bone marrow for age with plasma cell neoplasm PERIPHERAL BLOOD: -Mild macrocytic anemia -Thrombocytopenia COMMENT: The bone marrow is slightly hypercellular for age with increased number of plasma cells representing 7% of all cells in the aspirate with lack of large aggregates or sheets. The plasma cells display kappa light chain restriction consistent with plasma cell neoplasm. The background shows trilineage hematopoiesis with scattering of atypical megakaryocytes, possibly secondary in nature especially in the setting of ITP, kidney disease, etc. Nonetheless, correlation with cytogenetic and FISH studies is recommended.   12/24/23 US  Abd limited RUQ  IMPRESSION: 1. Increased hepatic parenchymal echogenicity suggestive of steatosis. 2. Status post cholecystectomy.  IMAGING: 11/02/2023 CT ABDOMEN AND PELVIS WITHOUT CONTRAST   TECHNIQUE:  Multidetector CT imaging of the abdomen and pelvis was performed  following the standard protocol without IV contrast.   RADIATION DOSE REDUCTION: This exam was performed according to the  departmental dose-optimization program which includes automated  exposure control, adjustment of the mA and/or kV according to  patient size and/or use of iterative reconstruction technique.   COMPARISON: 01/26/2023   FINDINGS:  Lower  chest: Lung bases are clear. Postoperative changes in the  mediastinum. Elevation of the left hemidiaphragm is unchanged.   Hepatobiliary: No focal liver abnormality is seen. Status post  cholecystectomy. No biliary dilatation.   Pancreas: Unremarkable. No pancreatic ductal dilatation or  surrounding inflammatory changes.   Spleen: Normal in size without focal abnormality.   Adrenals/Urinary Tract: Adrenal glands are unremarkable. Kidneys are  normal, without renal calculi, focal lesion, or hydronephrosis.  Bladder is unremarkable.   Stomach/Bowel: Stomach, small bowel, and colon are not abnormally  distended. Scattered stool in the colon. No wall thickening or  inflammatory changes. Appendix is not identified.   Vascular/Lymphatic: Aortic atherosclerosis. No enlarged abdominal or  pelvic lymph nodes.   Reproductive: Prostate gland is atrophic or surgically absent. No  pelvic mass or lymphadenopathy.   Other: No free air or free fluid in the abdomen. Small periumbilical  hernia containing fat. Cystic structure in the left periumbilical  space measuring 4 cm diameter. This is likely a sebaceous cyst. No  change since previous study.   Musculoskeletal: Degenerative changes in the spine. Spondylolysis  with mild spondylolisthesis at L4-5.   IMPRESSION:  1. No acute process demonstrated in the abdomen or pelvis. No  significant change since previous study.  2. Chronic elevation of the left hemidiaphragm.  3. Mild aortic atherosclerosis.  4. Cystic structure adjacent to the umbilicus likely representing a  sebaceous cyst. No change.  07/17/2023 ULTRASOUND ABDOMEN LIMITED RIGHT UPPER QUADRANT   COMPARISON: September 23, 2022   FINDINGS:  Gallbladder:   Surgically removed.   Common bile duct:   Diameter: 2.9 mm   Liver:   Increased echotexture. No focal liver lesion. Portal vein is patent  on color Doppler imaging with normal direction of blood flow towards  the liver.    Other: None.   IMPRESSION:  1. Prior cholecystectomy.  2. Increased echotexture of the liver. This is a nonspecific finding  but can be seen in hepatic steatosis.   06/26/2023 HEPATIC VENOGRAM  IVC pressure: atrial pressure: 0-45mmHgFree hepatic vein  pressure: hepatic  vein pressure: 25mmHg9-3= . Portal  vein pressure = , within normal   09/23/2022 EXAM: ABDOMEN ULTRASOUND COMPLETE  COMPARISON: Abdominal ultrasound dated 04/07/2022.  FINDINGS: Gallbladder: Surgically absent. No sonographic Murphy sign noted by sonographer.  Common bile duct: Diameter: 4 mm  Liver: No focal lesion identified. Increased parenchymal echogenicity. Portal vein is patent on color Doppler imaging with normal direction of blood flow towards the liver.  IVC: No abnormality visualized.  Pancreas: Visualized portion unremarkable.  Spleen: Size and appearance within normal limits.  Right Kidney: Length: 9.8 cm. Echogenicity within normal limits. No mass or hydronephrosis visualized.  Left Kidney: Length: 10.5 cm. Echogenicity within normal limits. No mass or hydronephrosis visualized.  Abdominal aorta: No aneurysm visualized.  Other findings: None.  IMPRESSION: 1. Hepatic steatosis. Please note limited evaluation for focal hepatic masses in a patient with hepatic steatosis due to decreased penetration of the acoustic ultrasound waves.  02/02/2021 MRI w/o Hepatobiliary: Hepatic parenchyma is grossly unremarkable on limited  imaging. Gallbladder surgically absent. Mild prominence of the intra  and extrahepatic   06/26/23 IR transjugular Biopsy: 06/26/2023 LIVER, BIOPSY:  - Heavily fragmented core of liver with essentially unremarkable  hepatic parenchyma and portal tracts with mild portal fibrosis, mild  chronic inflammation and questionable bile ductular proliferation.  Note: It is noted that the patient has a complex medical history  including acute on  chronic kidney injury, chronic thrombocytopenia, a  plasma cell neoplasm, diabetes, coronary artery disease with heart  failure and reduced ejection fraction as well as morbid obesity.   The hepatic parenchyma itself is overall unremarkable without the  presence of steatosis, ballooning or apoptotic bodies. It should be  noted there is quite a bit of fragmentation which can be a sign of a  greater degree of fibrosis/cirrhosis then can be identified on the  needle core biopsy given a diminished number of portal triads. The  portal triads available for evaluation do show a mild degree of portal  fibrosis but without definitive evidence of periportal or bridging type  fibrosis. There is the presence of questionable bile ductular reaction  which again raises the possibility of fibrosis that is not as readily  identifiable on the HE/trichrome stains. There is no central vein or  perisinusoidal/pericellular fibrosis. An iron stain shows no stainable  iron a PAS and reticulin stain are also reviewed in the workup of the  case.  In summary, there is no ongoing hepatocellular damage identified  including the presence of steatosis. 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 If  clinically indicated a repeat biopsy could be considered.   Procedures: 02/19/21 colonoscopy with Dr. Eda, recall 2028 Impression: - One 3 mm polyp in the transverse colon, removed with a cold snare. Resected and retrieved. - The examination was otherwise normal on direct and retroflexion views. Path:  F. COLON, TRANSVERSE, POLYPECTOMY: -  Tubular adenoma (1 of 1 fragments) -  No high-grade dysplasia or malignancy identified  04/22/21 virtual colonoscopy  IMPRESSION: 1. No polyp, stricture or mass. 2.  Aortic atherosclerosis  (ICD10-I70.0). 02/19/21 EGD Impression: - Normal esophagus. Biopsied. - Erosive gastropathy with no bleeding and no stigmata of recent bleeding. Biopsied. - One gastric polyp. Biopsied. - Erythematous duodenopathy. Biopsied. Path: A. DUODENUM, BIOPSY:  -  Benign duodenal mucosa with focal villous blunting  -  No acute inflammation or increased intraepithelial lymphocytes  identified   B. GASTRIC, RANDOM, FUNDUS, ANTRUM, BODY, BIOPSY:  -  Reactive gastropathy with intestinal metaplasia and patchy chronic  inflammation  - Focal features consistent with proton pump inhibitor effect  -  No H. pylori identified  -  See comment   C. GASTRIC, POLYPECTOMY:  -  Hyperplastic gastric polyp(s)  -  No H. pylori, intestinal metaplasia or malignancy identified   D. ESOPHAGUS, DISTAL, BIOPSY:  -  Benign squamous mucosa  -  No increased intraepithelial eosinophils   E. ESOPHAGUS, MID PROXIMAL, BIOPSY:  -  Benign squamous mucosa  -  No increased intraepithelial eosinophils   EGD 11/25/2022 with Dr. Eda  - Normal esophagus. - Three gastric polyps. Some spontaneous oozing. Biopsied. - Abnormal gastric mucosa with both erythema and loss of vascular pattern. Biopsied. - No evidence for portal hypertensive gastropathy, esophageal varices, or gastric varices. - Erythematous duodenopathy. Biopsied A. DUODENUM, BIOPSY: - Duodenal mucosa with prominent mucosal lymphocytic aggregates. No intraepithelial lymphocytes, villous atrophy or malignancy. No granulomas identified. - Helicobacter pylori immunostaining negative.  B. STOMACH, POLYPECTOMY: - Hyperplastic polyp. No intestinal metaplasia, dysplasia or malignancy.  C. STOMACH, ANTRUM, LESSER CURVE, BIOPSY: - Reactive gastropathy. - Intestinal metaplasia. No dysplasia or malignancy.  D. STOMACH, ANTRUM, GREATER CURVE, BIOPSY: - Reactive gastropathy. No intestinal metaplasia, dysplasia or malignancy. - Focal chronic gastritis.  E. STOMACH,  BODY, BIOPSY: - Gastric mucosa with patchy chronic gastritis. Helicobacter pylori immunostaining negative. - No intestinal metaplasia, dysplasia or malignancy.  F. STOMACH, INCISURA, BIOPSY: - Focal chronic gastritis. Helicobacter pylori immunostaining negative. - No intestinal metaplasia, dysplasia or malignancy.  G. STOMACH, FUNDUS, BIOPSY: - Patchy chronic gastritis. Helicobacter pylori immunostaining negative. - No intestinal metaplasia, dysplasia or malignancy.   Assessment: Encounter Diagnoses  Name Primary?   Gastroesophageal reflux disease without esophagitis Yes   Chronic constipation      53 year old male patient who presents with complaint of uncontrolled GERD on PPI therapy twice daily.  Patient was given samples of Voquenza now with better response.  There seem to be some confusion with his prescription therefore we were able to locate it with [facility] and patient would have a co-pay of 5 $50 per month which she states he is unsure if he can afford that long-term.  Will provide him samples today and he can call [facility] to clarify options.  Unaffordable we discussed checking with his insurance to see what other medications Michelyn Scullin be covered.    Patient also with history of Gastric intestinal metaplasia:  gastric mapping of GIM in 2023, which showed localized GIM in the antrum. Patient does not have any high risk factors for stomach cancer, no indication we need surveillance of his GIM for now. If Stephane Quest determine that the patient needs variceal screening, then we can plan for EGD per her recommendations in the future.     For the [facility] we discussed daily Miralax  OTC versus prn to regulate bowel.  We discussed how GLP-1's can potentially cause issues with constipation and he would need to make sure he is taking the Miralax  regularly. 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 Follow-up in 6 mths.  Plan: - Continue OTC Miralax  po daily, can titrate up to twice daily  -Voquenza 20mg  samples provided, recommended patient call [facility] directly -Follow-up with [facility] as scheduled  -recall  colonoscopy 04/2027  Thank you for the courtesy of this consult. Please call me with any questions or concerns.   Ica Daye, FNP-C [facility] 07/14/2024, 12:57 PM  Cc: Jesus Bernardino MATSU, MD

## 2024-07-14 NOTE — Patient Instructions (Addendum)
 Constipation  Continue OTC Miralax  po daily, can titrate up to twice daily  After starting Mounjaro  you may need to take this more regularly   Hemorrhoids If no improvement with topical ointment let the office know and we can try something else. No straining or pushing  GERD Samples of Voquenza Contact [facility] for payment options  Fatty liver Continue [facility] appts as scheduled   _______________________________________________________  If your blood pressure at your visit was 140/90 or greater, please contact your primary care physician to follow up on this.  _______________________________________________________  If you are age 51 or older, your body mass index should be between 23-30. Your Body mass index is 50.84 kg/m. If this is out of the aforementioned range listed, please consider follow up with your Primary Care Provider.  If you are age 14 or younger, your body mass index should be between 19-25. Your Body mass index is 50.84 kg/m. If this is out of the aformentioned range listed, please consider follow up with your Primary Care Provider.   ________________________________________________________  The [facility] providers would like to encourage you to use MYCHART to communicate with providers for non-urgent requests or questions.  Due to long hold times on the telephone, sending your provider a message by [facility] may be a faster and more efficient way to get a response.  Please allow 48 business hours for a response.  Please remember that this is for non-urgent requests.  _______________________________________________________  [facility] is using a team-based approach to care.  Your team is made up of your doctor and two to three APPS. Our APPS (Nurse Practitioners and Physician Assistants) work with your physician to ensure care continuity for you. They are fully qualified to address your health concerns and develop a treatment plan. They  communicate directly with your gastroenterologist to care for you. Seeing the Advanced Practice Practitioners on your physician's team can help you by facilitating care more promptly, often allowing for earlier appointments, access to diagnostic testing, procedures, and other specialty referrals.   Thank you for trusting me with your gastrointestinal care. Deanna May, FNP-C

## 2024-07-14 NOTE — Progress Notes (Signed)
 I agree with the assessment and plan as outlined by Ms. May.

## 2024-07-15 DIAGNOSIS — N186 End stage renal disease: Secondary | ICD-10-CM | POA: Diagnosis not present

## 2024-07-15 DIAGNOSIS — Z992 Dependence on renal dialysis: Secondary | ICD-10-CM | POA: Diagnosis not present

## 2024-07-15 DIAGNOSIS — L299 Pruritus, unspecified: Secondary | ICD-10-CM | POA: Diagnosis not present

## 2024-07-15 DIAGNOSIS — R52 Pain, unspecified: Secondary | ICD-10-CM | POA: Diagnosis not present

## 2024-07-15 DIAGNOSIS — N2581 Secondary hyperparathyroidism of renal origin: Secondary | ICD-10-CM | POA: Diagnosis not present

## 2024-07-18 DIAGNOSIS — R52 Pain, unspecified: Secondary | ICD-10-CM | POA: Diagnosis not present

## 2024-07-18 DIAGNOSIS — L299 Pruritus, unspecified: Secondary | ICD-10-CM | POA: Diagnosis not present

## 2024-07-18 DIAGNOSIS — N186 End stage renal disease: Secondary | ICD-10-CM | POA: Diagnosis not present

## 2024-07-18 DIAGNOSIS — Z992 Dependence on renal dialysis: Secondary | ICD-10-CM | POA: Diagnosis not present

## 2024-07-18 DIAGNOSIS — N2581 Secondary hyperparathyroidism of renal origin: Secondary | ICD-10-CM | POA: Diagnosis not present

## 2024-07-19 ENCOUNTER — Inpatient Hospital Stay: Payer: Self-pay | Attending: Oncology

## 2024-07-19 DIAGNOSIS — N186 End stage renal disease: Secondary | ICD-10-CM | POA: Diagnosis not present

## 2024-07-19 DIAGNOSIS — D6959 Other secondary thrombocytopenia: Secondary | ICD-10-CM | POA: Insufficient documentation

## 2024-07-19 DIAGNOSIS — D472 Monoclonal gammopathy: Secondary | ICD-10-CM | POA: Diagnosis not present

## 2024-07-19 DIAGNOSIS — E669 Obesity, unspecified: Secondary | ICD-10-CM | POA: Diagnosis not present

## 2024-07-19 DIAGNOSIS — Z992 Dependence on renal dialysis: Secondary | ICD-10-CM | POA: Diagnosis not present

## 2024-07-19 DIAGNOSIS — R161 Splenomegaly, not elsewhere classified: Secondary | ICD-10-CM | POA: Diagnosis not present

## 2024-07-19 DIAGNOSIS — K7460 Unspecified cirrhosis of liver: Secondary | ICD-10-CM | POA: Insufficient documentation

## 2024-07-19 DIAGNOSIS — K7581 Nonalcoholic steatohepatitis (NASH): Secondary | ICD-10-CM | POA: Insufficient documentation

## 2024-07-19 DIAGNOSIS — K7469 Other cirrhosis of liver: Secondary | ICD-10-CM | POA: Insufficient documentation

## 2024-07-19 LAB — IRON AND TIBC
Iron: 132 ug/dL (ref 45–182)
Saturation Ratios: 34 % (ref 17.9–39.5)
TIBC: 388 ug/dL (ref 250–450)
UIBC: 256 ug/dL

## 2024-07-19 LAB — CBC WITH DIFFERENTIAL (CANCER CENTER ONLY)
Abs Immature Granulocytes: 0.05 K/uL (ref 0.00–0.07)
Basophils Absolute: 0 K/uL (ref 0.0–0.1)
Basophils Relative: 1 %
Eosinophils Absolute: 0.2 K/uL (ref 0.0–0.5)
Eosinophils Relative: 4 %
HCT: 28.8 % — ABNORMAL LOW (ref 39.0–52.0)
Hemoglobin: 9.6 g/dL — ABNORMAL LOW (ref 13.0–17.0)
Immature Granulocytes: 1 %
Lymphocytes Relative: 21 %
Lymphs Abs: 1 K/uL (ref 0.7–4.0)
MCH: 36.2 pg — ABNORMAL HIGH (ref 26.0–34.0)
MCHC: 33.3 g/dL (ref 30.0–36.0)
MCV: 108.7 fL — ABNORMAL HIGH (ref 80.0–100.0)
Monocytes Absolute: 0.6 K/uL (ref 0.1–1.0)
Monocytes Relative: 13 %
Neutro Abs: 2.9 K/uL (ref 1.7–7.7)
Neutrophils Relative %: 60 %
Platelet Count: 58 K/uL — ABNORMAL LOW (ref 150–400)
RBC: 2.65 MIL/uL — ABNORMAL LOW (ref 4.22–5.81)
RDW: 15.6 % — ABNORMAL HIGH (ref 11.5–15.5)
WBC Count: 4.8 K/uL (ref 4.0–10.5)
nRBC: 0 % (ref 0.0–0.2)

## 2024-07-19 LAB — CMP (CANCER CENTER ONLY)
ALT: 84 U/L — ABNORMAL HIGH (ref 0–44)
AST: 87 U/L — ABNORMAL HIGH (ref 15–41)
Albumin: 3.4 g/dL — ABNORMAL LOW (ref 3.5–5.0)
Alkaline Phosphatase: 286 U/L — ABNORMAL HIGH (ref 38–126)
Anion gap: 15 (ref 5–15)
BUN: 44 mg/dL — ABNORMAL HIGH (ref 6–20)
CO2: 25 mmol/L (ref 22–32)
Calcium: 9.7 mg/dL (ref 8.9–10.3)
Chloride: 99 mmol/L (ref 98–111)
Creatinine: 6.07 mg/dL — ABNORMAL HIGH (ref 0.61–1.24)
GFR, Estimated: 10 mL/min — ABNORMAL LOW (ref >60)
Glucose, Bld: 152 mg/dL — ABNORMAL HIGH (ref 70–99)
Potassium: 3.5 mmol/L (ref 3.5–5.1)
Sodium: 139 mmol/L (ref 135–145)
Total Bilirubin: 2.1 mg/dL — ABNORMAL HIGH (ref 0.0–1.2)
Total Protein: 6.5 g/dL (ref 6.5–8.1)

## 2024-07-19 LAB — VITAMIN B12: Vitamin B-12: >7500 pg/mL — ABNORMAL HIGH (ref 180–914)

## 2024-07-19 LAB — FOLATE: Folate: 20.2 ng/mL (ref >5.9)

## 2024-07-19 LAB — FERRITIN: Ferritin: 547 ng/mL — ABNORMAL HIGH (ref 24–336)

## 2024-07-20 ENCOUNTER — Other Ambulatory Visit: Payer: Self-pay | Admitting: Internal Medicine

## 2024-07-20 DIAGNOSIS — N186 End stage renal disease: Secondary | ICD-10-CM | POA: Diagnosis not present

## 2024-07-20 DIAGNOSIS — L299 Pruritus, unspecified: Secondary | ICD-10-CM | POA: Diagnosis not present

## 2024-07-20 DIAGNOSIS — R52 Pain, unspecified: Secondary | ICD-10-CM | POA: Diagnosis not present

## 2024-07-20 DIAGNOSIS — R1013 Epigastric pain: Secondary | ICD-10-CM

## 2024-07-20 DIAGNOSIS — N2581 Secondary hyperparathyroidism of renal origin: Secondary | ICD-10-CM | POA: Diagnosis not present

## 2024-07-20 DIAGNOSIS — Z992 Dependence on renal dialysis: Secondary | ICD-10-CM | POA: Diagnosis not present

## 2024-07-20 LAB — KAPPA/LAMBDA LIGHT CHAINS
Kappa free light chain: 1279.1 mg/L — ABNORMAL HIGH (ref 3.3–19.4)
Kappa, lambda light chain ratio: 27.99 — ABNORMAL HIGH (ref 0.26–1.65)
Lambda free light chains: 45.7 mg/L — ABNORMAL HIGH (ref 5.7–26.3)

## 2024-07-21 ENCOUNTER — Ambulatory Visit: Admitting: Internal Medicine

## 2024-07-21 ENCOUNTER — Encounter: Payer: Self-pay | Admitting: Internal Medicine

## 2024-07-21 VITALS — BP 112/64 | HR 76 | Temp 98.0°F | Ht 71.0 in | Wt 365.4 lb

## 2024-07-21 DIAGNOSIS — R110 Nausea: Secondary | ICD-10-CM

## 2024-07-21 DIAGNOSIS — K7581 Nonalcoholic steatohepatitis (NASH): Secondary | ICD-10-CM

## 2024-07-21 DIAGNOSIS — R1013 Epigastric pain: Secondary | ICD-10-CM | POA: Diagnosis not present

## 2024-07-21 DIAGNOSIS — N186 End stage renal disease: Secondary | ICD-10-CM

## 2024-07-21 DIAGNOSIS — Z5987 Material hardship due to limited financial resources, not elsewhere classified: Secondary | ICD-10-CM

## 2024-07-21 DIAGNOSIS — K2100 Gastro-esophageal reflux disease with esophagitis, without bleeding: Secondary | ICD-10-CM

## 2024-07-21 DIAGNOSIS — L089 Local infection of the skin and subcutaneous tissue, unspecified: Secondary | ICD-10-CM

## 2024-07-21 MED ORDER — SUCRALFATE 1 G PO TABS: 1.0000 g | ORAL_TABLET | Freq: Every evening | ORAL | 1 refills | Status: DC | PRN

## 2024-07-21 MED ORDER — VOQUEZNA 20 MG PO TABS
20.0000 mg | ORAL_TABLET | Freq: Once | ORAL | Status: AC
Start: 2024-07-21 — End: 2024-07-21

## 2024-07-21 MED ORDER — CEPHALEXIN 500 MG PO CAPS: 500.0000 mg | ORAL_CAPSULE | Freq: Two times a day (BID) | ORAL | 0 refills | Status: DC

## 2024-07-21 MED ORDER — PROMETHAZINE HCL 25 MG PO TABS: 25.0000 mg | ORAL_TABLET | Freq: Three times a day (TID) | ORAL | 0 refills | Status: DC | PRN

## 2024-07-21 NOTE — Assessment & Plan Note (Signed)
 End-stage renal disease on hemodialysis   He is well-managed on hemodialysis with improved tolerance after adjusting medication timing. There are no current issues with dialysis sessions. Continue dialysis schedule on Monday, Wednesday, and Friday. Advise taking medications post-dialysis to avoid hypotension and adverse effects.

## 2024-07-21 NOTE — Progress Notes (Signed)
 ==============================  [facility]: [phone]   -- Medical Office Visit --  Patient: Sean [name]      Age: 53 y.o.       Sex:  male  Date:   07/21/2024 Today's Healthcare Provider: Bernardino KANDICE Cone, MD  ==============================   Chief Complaint: skin nodule and Emesis (Saw gastrointestinal last week still vomiting, wlrf )   Discussed the use of AI scribe software for clinical note transcription with the patient, who gave verbal consent to proceed.  History of Present Illness  53 year old male with chronic nausea and liver scarring who presents with persistent nausea and vomiting.  He experiences persistent nausea and vomiting, describing it as 'never ending.' Vomiting occurred twice this week, once after consuming hot sausage. The vomiting sometimes occurs immediately after eating, with the vomitus appearing 'knotty, stringy, gooky looking' without visible food or medication.  He experiences occasional upper abdominal pain associated with certain foods, such as grilled chicken and fried fish. He avoids fried foods and notes that fatty foods exacerbate his symptoms. A family member suggested his symptoms might be related to his bile duct, although he does not have a gallbladder.  He is on dialysis three times a week (Monday, Wednesday, and Friday) and feels better on this schedule compared to his previous one. He has experienced issues with blood pressure management during dialysis, which have been addressed by adjusting medication timing.  He has a history of skin infections in skin folds, and there is a possibility that the nodule is an infected lymph node.  He is unable to afford medications like Voquezna  and Ozempic  due to financial constraints and is awaiting disability income, which will begin next month. He is considering applying for Medicaid to help with medication costs. Background Reviewed: Problem List: has  History of umbilical hernia repair; Hypertension; Hyperlipidemia; Thrombocytopenia (HCC); Sinus bradycardia; Atherosclerotic heart disease of native coronary artery without angina pectoris; Atrial flutter with rapid ventricular response (HCC); Morbid obesity (HCC); Other long term (current) drug therapy; History of coronary artery bypass surgery; Obstructive sleep apnea syndrome; Chronic gout of multiple sites; Alkaline phosphatase elevation; Gout; Ectatic aorta (HCC); Gastroesophageal reflux disease without esophagitis; Herpes simplex; Metabolic dysfunction-associated steatohepatitis (MASH); Prediabetes; Statins contraindicated; Hypothyroid; Chronic venous stasis dermatitis of both lower extremities; Chronic nausea; Chronic liver failure without hepatic coma (HCC); Splenomegaly; Spondylolisthesis; Bleeding diathesis (HCC); Malaise and fatigue; Allergic rhinitis due to pollen; Amitriptyline  adverse reaction; MGUS (monoclonal gammopathy of unknown significance); 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; Rectal bleeding; Recurrent sinusitis; Asthma, chronic; ESRD (end stage renal disease) (HCC); SVT (supraventricular tachycardia) (HCC); PUD (peptic ulcer disease); Disability affecting daily living; Inflamed internal hemorrhoid; Anemia in chronic kidney disease; Chronic combined systolic (congestive) and diastolic (congestive) heart failure (HCC); Coagulation defect, unspecified (HCC); Dependence on renal dialysis (HCC); Gout due to renal impairment, unspecified site; Hypertensive chronic kidney disease with stage 5 chronic kidney disease or end stage renal disease (HCC); Mild protein-calorie malnutrition (HCC); Other chronic pain; Other disorders of phosphorus metabolism; Other fluid overload; Iron deficiency anemia, unspecified; Other iron deficiency anemias; Pruritus, unspecified; Pure hypercholesterolemia, unspecified; Secondary hyperparathyroidism of renal origin (HCC); Shortness of breath; and Abnormal bowel  habits on their problem list. Past Medical History:  has a past medical history of Abnormal liver enzymes, Acute renal failure superimposed on stage 5 chronic kidney disease, not on chronic dialysis (HCC) (01/08/2024), AKI (acute kidney injury) (HCC) (06/17/2023), Atrial fibrillation (HCC), Cardiac cirrhosis, Chronic combined systolic and diastolic  CHF (congestive heart failure) (HCC), Chronic kidney disease, stage 4 (severe) (HCC) (01/04/2016), CKD (chronic kidney disease) stage 3, GFR 30-59 ml/min (HCC) (01/04/2016), CKD (chronic kidney disease), stage II, Congenital heart defect, Coronary artery disease, Dyspnea, Gastric polyps (11/25/2022), Gastritis and gastroduodenitis (11/25/2022), Gastroesophageal reflux disease with esophagitis and hemorrhage (04/04/2021), Hematuria, History of umbilical hernia repair (07/10/2011), Hypercholesteremia, Hypertension, Incisional hernia (07/10/2011), Morbid obesity (HCC), Morbid obesity with BMI of 50.0-59.9, adult (HCC) (01/04/2016), Nausea and vomiting (11/25/2022), OSA on CPAP, Paroxysmal atrial flutter (HCC) (02/12/2016), PONV (postoperative nausea and vomiting), 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 Past Surgical History:   has a past surgical history that includes Gallbladder surgery; Appendectomy; Hernia repair; Cardiac catheterization (N/A, 12/24/2015); Coronary artery bypass graft (N/A, 12/26/2015); TEE without cardioversion (N/A, 12/26/2015); Cardioversion (N/A, 02/14/2016); TEE without cardioversion (N/A, 02/14/2016); LEFT HEART CATH AND CORS/GRAFTS ANGIOGRAPHY (N/A, 04/15/2017); LEFT HEART CATH AND CORS/GRAFTS ANGIOGRAPHY (N/A, 11/02/2020); Colonoscopy with propofol  (N/A, 02/19/2021); Esophagogastroduodenoscopy (egd) with propofol  (N/A, 02/19/2021); biopsy (02/19/2021); polypectomy (02/19/2021); Esophagogastroduodenoscopy (egd) with propofol  (N/A, 11/25/2022); biopsy (11/25/2022); IR Transcatheter BX (06/26/2023); IR US  Guide Vasc  Access Right (06/26/2023); IR Venogram Hepatic W Hemodynamic Evaluation (06/26/2023); AV fistula placement (Left, 01/11/2024); Insertion of dialysis catheter (Right, 01/11/2024); Fistula superficialization (Left, 05/06/2024); and Ligation of competing branches of arteriovenous fistula (Left, 05/06/2024). Social History:   reports that he has never smoked. He has never used smokeless tobacco. He reports that he does not currently use alcohol. He reports that he does not use drugs. Family History:  family history includes CAD in his father; Diabetes in his father, maternal grandfather, and maternal uncle; Heart attack in an other family member; Hyperlipidemia in an other family member. Allergies:  is allergic to glucophage  [metformin ], vibra -tab [doxycycline ], chlorhexidine , chlorhexidine  gluconate, augmentin [amoxicillin-pot clavulanate], imdur  [isosorbide  dinitrate], tape, and valacyclovir .   Medication Reconciliation: Current Outpatient Medications on File Prior to Visit  Medication Sig   allopurinol  (ZYLOPRIM ) 100 MG tablet TAKE 1 TABLET BY MOUTH EVERY DAY   amitriptyline  (ELAVIL ) 25 MG tablet TAKE 1 TABLET BY MOUTH EVERYDAY AT BEDTIME   azelastine  (ASTELIN ) 0.1 % nasal spray Place 2 sprays into both nostrils 2 (two) times daily. Use in each nostril as directed   B Complex-C-Folic Acid  (DIALYVITE TABLET) TABS Take 1 tablet by mouth daily.   Cyanocobalamin  (VITAMIN B-12) 5000 MCG SUBL Place 5,000 mcg under the tongue daily.   diltiazem  (CARDIZEM  SR) 120 MG 12 hr capsule TAKE 1 CAPSULE BY MOUTH TWICE A DAY   ELIQUIS  5 MG TABS tablet TAKE 1 TABLET(5 MG) BY MOUTH TWICE DAILY   Evolocumab  (REPATHA ) 140 MG/ML SOSY Inject 140 mg into the skin every 14 (fourteen) days.   folic acid -vitamin b complex-vitamin c-selenium-zinc (DIALYVITE) 3 MG TABS tablet Take 1 tablet by mouth daily.   iron sucrose in sodium chloride  0.9 % 100 mL Iron Sucrose (Venofer)   ketoconazole  (NIZORAL ) 2 % cream APPLY TO AFFECTED AREA TWICE  A DAY   lanthanum (FOSRENOL) 1000 MG chewable tablet Chew 1,000 mg by mouth 3 (three) times daily with meals.   losartan  (COZAAR ) 50 MG tablet Take 50 mg by mouth daily.   Methoxy PEG-Epoetin Beta (MIRCERA IJ) 75 mcg.   metoprolol  succinate (TOPROL -XL) 25 MG 24 hr tablet Take 25 mg by mouth daily.   montelukast  (SINGULAIR ) 10 MG tablet TAKE 1 TABLET BY MOUTH EVERY DAY   nitroGLYCERIN  (NITROSTAT ) 0.4 MG SL tablet Place 1 tablet (0.4 mg total) under the tongue every 5 (five) minutes as  needed for chest pain.   ondansetron  (ZOFRAN -ODT) 4 MG disintegrating tablet Take 1 tablet (4 mg total) by mouth every 8 (eight) hours as needed for nausea or vomiting.   oxyCODONE -acetaminophen  (PERCOCET) 5-325 MG tablet Take 1 tablet by mouth every 6 (six) hours as needed for severe pain (pain score 7-10).   pantoprazole  (PROTONIX ) 40 MG tablet Take 40 mg by mouth 2 (two) times daily.   sulfamethoxazole -trimethoprim  (BACTRIM  DS) 800-160 MG tablet Take 1 tablet by mouth daily for 10 days.   tirzepatide  (MOUNJARO ) 2.5 MG/0.5ML Pen Inject 2.5 mg into the skin once a week. (Patient not taking: Reported on 07/14/2024)   torsemide  (DEMADEX ) 10 MG tablet Take 10 mg by mouth daily.   Current Facility-Administered Medications on File Prior to Visit  Medication   technetium tetrofosmin  (TC-MYOVIEW ) injection 29.8 millicurie   Medications Discontinued During This Encounter  Medication Reason   sucralfate  (CARAFATE ) 1 g tablet Reorder   VOQUEZNA  20 MG TABS      Physical Exam:    07/21/2024   11:09 AM 07/14/2024   10:55 AM 06/23/2024   10:42 AM  Vitals with BMI  Height 5' 11 5' 11   Weight 365 lbs 6 oz 364 lbs 8 oz 361 lbs 8 oz  BMI 50.99 50.86 50.44  Systolic 112 118 870  Diastolic 64 50 70  Pulse 76 81 83  Vital signs reviewed.  Nursing notes reviewed. Weight trend reviewed. Physical Activity: Not on file   General Appearance:  No acute distress appreciable.   Well-groomed, healthy-appearing male.  Well  proportioned with no abnormal fat distribution.  Good muscle tone. Pulmonary:  Normal work of breathing at rest, no respiratory distress apparent. SpO2: 98 %  Musculoskeletal: All extremities are intact.  Neurological:  Awake, alert, oriented, and engaged.  No obvious focal neurological deficits or cognitive impairments.  Sensorium seems unclouded.   Speech is clear and coherent with logical content. Psychiatric:  Appropriate mood, pleasant and cooperative demeanor, thoughtful and engaged during the exam   Verbalized to patient: Physical Exam SKIN: A hard nodule in the left pelvic region, non-drainable, with no signs of infection.   Results:    04/22/2024    9:21 AM 02/12/2024    1:08 PM 02/05/2024    8:26 AM 11/02/2023   10:55 AM  PHQ 2/9 Scores  PHQ - 2 Score 0 0 0 2  PHQ- 9 Score 0   8    Verbalized to patient: Results PATHOLOGY Liver pathology: Hepatic fibrosis    Appointment on 07/19/2024  Component Date Value Ref Range Status   Kappa free light chain 07/19/2024 1,279.1 (H)  3.3 - 19.4 mg/L Final   Lambda free light chains 07/19/2024 45.7 (H)  5.7 - 26.3 mg/L Final   Kappa, lambda light chain ratio 07/19/2024 27.99 (H)  0.26 - 1.65 Final   Vitamin B-12 07/19/2024 >7,500 (H)  180 - 914 pg/mL Final   Folate 07/19/2024 20.2  >5.9 ng/mL Final   Iron 07/19/2024 132  45 - 182 ug/dL Final   TIBC 91/80/7974 388  250 - 450 ug/dL Final   Saturation Ratios 07/19/2024 34  17.9 - 39.5 % Final   UIBC 07/19/2024 256  ug/dL Final   Ferritin 91/80/7974 547 (H)  24 - 336 ng/mL Final   Sodium 07/19/2024 139  135 - 145 mmol/L Final   Potassium 07/19/2024 3.5  3.5 - 5.1 mmol/L Final   Chloride 07/19/2024 99  98 - 111 mmol/L Final   CO2 07/19/2024  25  22 - 32 mmol/L Final   Glucose, Bld 07/19/2024 152 (H)  70 - 99 mg/dL Final   BUN 91/80/7974 44 (H)  6 - 20 mg/dL Final   Creatinine 91/80/7974 6.07 (H)  0.61 - 1.24 mg/dL Final   Calcium  07/19/2024 9.7  8.9 - 10.3 mg/dL Final   Total  Protein 07/19/2024 6.5  6.5 - 8.1 g/dL Final   Albumin  07/19/2024 3.4 (L)  3.5 - 5.0 g/dL Final   AST 91/80/7974 87 (H)  15 - 41 U/L Final   ALT 07/19/2024 84 (H)  0 - 44 U/L Final   Alkaline Phosphatase 07/19/2024 286 (H)  38 - 126 U/L Final   Total Bilirubin 07/19/2024 2.1 (H)  0.0 - 1.2 mg/dL Final   GFR, Estimated 07/19/2024 10 (L)  >60 mL/min Final   Anion gap 07/19/2024 15  5 - 15 Final   WBC Count 07/19/2024 4.8  4.0 - 10.5 K/uL Final   RBC 07/19/2024 2.65 (L)  4.22 - 5.81 MIL/uL Final   Hemoglobin 07/19/2024 9.6 (L)  13.0 - 17.0 g/dL Final   HCT 91/80/7974 28.8 (L)  39.0 - 52.0 % Final   MCV 07/19/2024 108.7 (H)  80.0 - 100.0 fL Final   MCH 07/19/2024 36.2 (H)  26.0 - 34.0 pg Final   MCHC 07/19/2024 33.3  30.0 - 36.0 g/dL Final   RDW 91/80/7974 15.6 (H)  11.5 - 15.5 % Final   Platelet Count 07/19/2024 58 (L)  150 - 400 K/uL Final   nRBC 07/19/2024 0.0  0.0 - 0.2 % Final   Neutrophils Relative % 07/19/2024 60  % Final   Neutro Abs 07/19/2024 2.9  1.7 - 7.7 K/uL Final   Lymphocytes Relative 07/19/2024 21  % Final   Lymphs Abs 07/19/2024 1.0  0.7 - 4.0 K/uL Final   Monocytes Relative 07/19/2024 13  % Final   Monocytes Absolute 07/19/2024 0.6  0.1 - 1.0 K/uL Final   Eosinophils Relative 07/19/2024 4  % Final   Eosinophils Absolute 07/19/2024 0.2  0.0 - 0.5 K/uL Final   Basophils Relative 07/19/2024 1  % Final   Basophils Absolute 07/19/2024 0.0  0.0 - 0.1 K/uL Final   Immature Granulocytes 07/19/2024 1  % Final   Abs Immature Granulocytes 07/19/2024 0.05  0.00 - 0.07 K/uL Final  Office Visit on 06/16/2024  Component Date Value Ref Range Status   Color, Urine 06/16/2024 DARK YELLOW  YELLOW Final   APPearance 06/16/2024 TURBID (A)  CLEAR Final   Specific Gravity, Urine 06/16/2024 1.019  1.001 - 1.035 Final   pH 06/16/2024 5.5  5.0 - 8.0 Final   Glucose, UA 06/16/2024 NEGATIVE  NEGATIVE Final   Bilirubin Urine 06/16/2024 NEGATIVE  NEGATIVE Final   Ketones, ur 06/16/2024 TRACE  (A)  NEGATIVE Final   Hgb urine dipstick 06/16/2024 TRACE (A)  NEGATIVE Final   Protein, ur 06/16/2024 2+ (A)  NEGATIVE Final   Nitrites, Initial 06/16/2024 NEGATIVE  NEGATIVE Final   Leukocyte Esterase 06/16/2024 3+ (A)  NEGATIVE Final   WBC, UA 06/16/2024 > OR = 60 (A)  0 - 5 /HPF Final   RBC / HPF 06/16/2024 NONE SEEN  0 - 2 /HPF Final   Squamous Epithelial / HPF 06/16/2024 0-5  < OR = 5 /HPF Final   Bacteria, UA 06/16/2024 MANY (A)  NONE SEEN /HPF Final   Hyaline Cast 06/16/2024 NONE SEEN  NONE SEEN /LPF Final   Note 06/16/2024    Final  MICRO NUMBER: 06/16/2024 [id number]   Final   SPECIMEN QUALITY: 06/16/2024 Adequate   Final   Sample Source 06/16/2024 URINE   Final   STATUS: 06/16/2024 FINAL   Final   ISOLATE 1: 06/16/2024 Klebsiella pneumoniae (A)   Final   REFLEXIVE URINE CULTURE 06/16/2024    Final  Admission on 05/06/2024, Discharged on 05/06/2024  Component Date Value Ref Range Status   Sodium 05/06/2024 135  135 - 145 mmol/L Final   Potassium 05/06/2024 3.7  3.5 - 5.1 mmol/L Final   Chloride 05/06/2024 101  98 - 111 mmol/L Final   BUN 05/06/2024 38 (H)  6 - 20 mg/dL Final   Creatinine, Ser 05/06/2024 7.50 (H)  0.61 - 1.24 mg/dL Final   Glucose, Bld 93/93/7974 166 (H)  70 - 99 mg/dL Final   Calcium , Ion 05/06/2024 1.02 (L)  1.15 - 1.40 mmol/L Final   TCO2 05/06/2024 23  22 - 32 mmol/L Final   Hemoglobin 05/06/2024 11.2 (L)  13.0 - 17.0 g/dL Final   HCT 93/93/7974 33.0 (L)  39.0 - 52.0 % Final  Scanned Document on 03/24/2024  Component Date Value Ref Range Status   Creatinine, POC 05/30/2023 95.2  mg/dL Final   EGFR 93/70/7975 18.0   Final  Office Visit on 03/14/2024  Component Date Value Ref Range Status   Cholesterol 03/14/2024 208 (H)  0 - 200 mg/dL Final   Triglycerides 95/85/7974 167.0 (H)  0.0 - 149.0 mg/dL Final   HDL 95/85/7974 50.60  >39.00 mg/dL Final   VLDL 95/85/7974 33.4  0.0 - 40.0 mg/dL Final   LDL Cholesterol 03/14/2024 124 (H)  0 - 99 mg/dL Final    Total CHOL/HDL Ratio 03/14/2024 4   Final   NonHDL 03/14/2024 157.38   Final   Sodium 03/14/2024 137  135 - 145 mEq/L Final   Potassium 03/14/2024 4.3  3.5 - 5.1 mEq/L Final   Chloride 03/14/2024 98  96 - 112 mEq/L Final   CO2 03/14/2024 22  19 - 32 mEq/L Final   Glucose, Bld 03/14/2024 152 (H)  70 - 99 mg/dL Final   BUN 95/85/7974 69 (H)  6 - 23 mg/dL Final   Creatinine, Ser 03/14/2024 10.96 (HH)  0.40 - 1.50 mg/dL Final   Total Bilirubin 03/14/2024 1.6 (H)  0.2 - 1.2 mg/dL Final   Alkaline Phosphatase 03/14/2024 244 (H)  39 - 117 U/L Final   AST 03/14/2024 55 (H)  0 - 37 U/L Final   ALT 03/14/2024 64 (H)  0 - 53 U/L Final   Total Protein 03/14/2024 6.1  6.0 - 8.3 g/dL Final   Albumin  03/14/2024 3.3 (L)  3.5 - 5.2 g/dL Final   GFR 95/85/7974 4.94 (LL)  >60.00 mL/min Final   Calcium  03/14/2024 9.2  8.4 - 10.5 mg/dL Final   WBC 95/85/7974 7.8  4.0 - 10.5 K/uL Final   RBC 03/14/2024 3.07 (L)  4.22 - 5.81 Mil/uL Final   Hemoglobin 03/14/2024 11.0 (L)  13.0 - 17.0 g/dL Final   HCT 95/85/7974 32.0 (L)  39.0 - 52.0 % Final   MCV 03/14/2024 104.2 (H)  78.0 - 100.0 fl Final   MCHC 03/14/2024 34.4  30.0 - 36.0 g/dL Final   RDW 95/85/7974 15.9 (H)  11.5 - 15.5 % Final   Platelets 03/14/2024 89.0 (L)  150.0 - 400.0 K/uL Final   Neutrophils Relative % 03/14/2024 59.4  43.0 - 77.0 % Final   Lymphocytes Relative 03/14/2024 17.6  12.0 - 46.0 % Final  Monocytes Relative 03/14/2024 16.0 (H)  3.0 - 12.0 % Final   Eosinophils Relative 03/14/2024 6.6 (H)  0.0 - 5.0 % Final   Basophils Relative 03/14/2024 0.4  0.0 - 3.0 % Final   Neutro Abs 03/14/2024 4.6  1.4 - 7.7 K/uL Final   Lymphs Abs 03/14/2024 1.4  0.7 - 4.0 K/uL Final   Monocytes Absolute 03/14/2024 1.2 (H)  0.1 - 1.0 K/uL Final   Eosinophils Absolute 03/14/2024 0.5  0.0 - 0.7 K/uL Final   Basophils Absolute 03/14/2024 0.0  0.0 - 0.1 K/uL Final   TSH 03/14/2024 2.070  0.450 - 4.500 uIU/mL Final   Hgb A1c MFr Bld 03/14/2024 6.1  4.6 - 6.5 %  Final   Magnesium  03/14/2024 2.4  1.5 - 2.5 mg/dL Final   Phosphorus 95/85/7974 8.1 (H)  2.3 - 4.6 mg/dL Final  Appointment on 97/82/7974  Component Date Value Ref Range Status   WBC Count 01/18/2024 7.4  4.0 - 10.5 K/uL Final   RBC 01/18/2024 3.33 (L)  4.22 - 5.81 MIL/uL Final   Hemoglobin 01/18/2024 11.4 (L)  13.0 - 17.0 g/dL Final   HCT 97/82/7974 33.4 (L)  39.0 - 52.0 % Final   MCV 01/18/2024 100.3 (H)  80.0 - 100.0 fL Final   MCH 01/18/2024 34.2 (H)  26.0 - 34.0 pg Final   MCHC 01/18/2024 34.1  30.0 - 36.0 g/dL Final   RDW 97/82/7974 14.8  11.5 - 15.5 % Final   Platelet Count 01/18/2024 70 (L)  150 - 400 K/uL Final   nRBC 01/18/2024 0.00  0.0 - 0.2 % Final   Neutrophils Relative % 01/18/2024 48  % Final   Neutro Abs 01/18/2024 3.6  1.7 - 7.7 K/uL Final   Lymphocytes Relative 01/18/2024 26  % Final   Lymphs Abs 01/18/2024 1.9  0.7 - 4.0 K/uL Final   Monocytes Relative 01/18/2024 16  % Final   Monocytes Absolute 01/18/2024 1.2 (H)  0.1 - 1.0 K/uL Final   Eosinophils Relative 01/18/2024 8  % Final   Eosinophils Absolute 01/18/2024 0.6 (H)  0.0 - 0.5 K/uL Final   Basophils Relative 01/18/2024 1  % Final   Basophils Absolute 01/18/2024 0.1  0.0 - 0.1 K/uL Final   Immature Granulocytes 01/18/2024 1  % Final   Abs Immature Granulocytes 01/18/2024 0.07  0.00 - 0.07 K/uL Final   nRBC 01/18/2024 0  0 /100 WBC Final   Immature Platelet Fraction 01/18/2024 0.3 (L)  1.2 - 8.6 % Final   Ferritin 01/18/2024 114  24 - 336 ng/mL Final   Iron 01/18/2024 152  45 - 182 ug/dL Final   TIBC 97/82/7974 456 (H)  250 - 450 ug/dL Final   Saturation Ratios 01/18/2024 33  17.9 - 39.5 % Final   UIBC 01/18/2024 304  ug/dL Final   Vitamin B-12 97/82/7974 6,881 (H)  180 - 914 pg/mL Final   IgG (Immunoglobin G), Serum 01/18/2024 561 (L)  603 - 1,613 mg/dL Final   IgA 97/82/7974 1,203 (H)  90 - 386 mg/dL Final   IgM (Immunoglobulin M), Srm 01/18/2024 91  20 - 172 mg/dL Final   Total Protein ELP 01/18/2024  5.9 (L)  6.0 - 8.5 g/dL Corrected   Albumin  SerPl Elph-Mcnc 01/18/2024 2.8 (L)  2.9 - 4.4 g/dL Corrected   Alpha 1 97/82/7974 0.3  0.0 - 0.4 g/dL Corrected   Alpha2 Glob SerPl Elph-Mcnc 01/18/2024 0.6  0.4 - 1.0 g/dL Corrected   B-Globulin SerPl Elph-Mcnc 01/18/2024 1.7 (H)  0.7 - 1.3 g/dL Corrected   Gamma Glob SerPl Elph-Mcnc 01/18/2024 0.4  0.4 - 1.8 g/dL Corrected   M Protein SerPl Elph-Mcnc 01/18/2024 0.9 (H)  Not Observed g/dL Corrected   Globulin, Total 01/18/2024 3.1  2.2 - 3.9 g/dL Corrected   Albumin /Glob SerPl 01/18/2024 1.0  0.7 - 1.7 Corrected   IFE 1 01/18/2024 Comment (A)   Corrected   Please Note 01/18/2024 Comment   Corrected   Kappa free light chain 01/18/2024 1,099.9 (H)  3.3 - 19.4 mg/L Final   Lambda free light chains 01/18/2024 33.2 (H)  5.7 - 26.3 mg/L Final   Kappa, lambda light chain ratio 01/18/2024 33.13 (H)  0.26 - 1.65 Final  No results displayed because visit has over 200 results.    Admission on 01/05/2024, Discharged on 01/05/2024  Component Date Value Ref Range Status   WBC 01/05/2024 6.5  4.0 - 10.5 K/uL Final   RBC 01/05/2024 3.17 (L)  4.22 - 5.81 MIL/uL Final   Hemoglobin 01/05/2024 10.8 (L)  13.0 - 17.0 g/dL Final   HCT 97/95/7974 32.8 (L)  39.0 - 52.0 % Final   MCV 01/05/2024 103.5 (H)  80.0 - 100.0 fL Final   MCH 01/05/2024 34.1 (H)  26.0 - 34.0 pg Final   MCHC 01/05/2024 32.9  30.0 - 36.0 g/dL Final   RDW 97/95/7974 14.2  11.5 - 15.5 % Final   Platelets 01/05/2024 66 (L)  150 - 400 K/uL Final   nRBC 01/05/2024 0.0  0.0 - 0.2 % Final   Neutrophils Relative % 01/05/2024 54  % Final   Neutro Abs 01/05/2024 3.6  1.7 - 7.7 K/uL Final   Lymphocytes Relative 01/05/2024 22  % Final   Lymphs Abs 01/05/2024 1.4  0.7 - 4.0 K/uL Final   Monocytes Relative 01/05/2024 12  % Final   Monocytes Absolute 01/05/2024 0.8  0.1 - 1.0 K/uL Final   Eosinophils Relative 01/05/2024 10  % Final   Eosinophils Absolute 01/05/2024 0.6 (H)  0.0 - 0.5 K/uL Final    Basophils Relative 01/05/2024 1  % Final   Basophils Absolute 01/05/2024 0.1  0.0 - 0.1 K/uL Final   Immature Granulocytes 01/05/2024 1  % Final   Abs Immature Granulocytes 01/05/2024 0.05  0.00 - 0.07 K/uL Final   Sodium 01/05/2024 142  135 - 145 mmol/L Final   Potassium 01/05/2024 3.9  3.5 - 5.1 mmol/L Final   Chloride 01/05/2024 107  98 - 111 mmol/L Final   CO2 01/05/2024 24  22 - 32 mmol/L Final   Glucose, Bld 01/05/2024 151 (H)  70 - 99 mg/dL Final   BUN 97/95/7974 57 (H)  6 - 20 mg/dL Final   Creatinine, Ser 01/05/2024 6.95 (H)  0.61 - 1.24 mg/dL Final   Calcium  01/05/2024 8.9  8.9 - 10.3 mg/dL Final   Total Protein 97/95/7974 5.9 (L)  6.5 - 8.1 g/dL Final   Albumin  01/05/2024 2.4 (L)  3.5 - 5.0 g/dL Final   AST 97/95/7974 27  15 - 41 U/L Final   ALT 01/05/2024 26  0 - 44 U/L Final   Alkaline Phosphatase 01/05/2024 140 (H)  38 - 126 U/L Final   Total Bilirubin 01/05/2024 0.7  0.0 - 1.2 mg/dL Final   GFR, Estimated 01/05/2024 9 (L)  >60 mL/min Final   Anion gap 01/05/2024 11  5 - 15 Final   Magnesium  01/05/2024 2.4  1.7 - 2.4 mg/dL Final   B Natriuretic Peptide 01/05/2024 430.6 (H)  0.0 -  100.0 pg/mL Final   SARS Coronavirus 2 by RT PCR 01/05/2024 NEGATIVE  NEGATIVE Final   Influenza A by PCR 01/05/2024 NEGATIVE  NEGATIVE Final   Influenza B by PCR 01/05/2024 NEGATIVE  NEGATIVE Final   Resp Syncytial Virus by PCR 01/05/2024 NEGATIVE  NEGATIVE Final  Scanned Document on 12/28/2023  Component Date Value Ref Range Status   PTH 12/23/2023 177   Final   Calcium  12/23/2023 9.1   Final   EGFR 12/23/2023 10.0   Final  Office Visit on 12/14/2023  Component Date Value Ref Range Status   WBC 12/14/2023 6.3  4.0 - 10.5 K/uL Final   RBC 12/14/2023 3.69 (L)  4.22 - 5.81 Mil/uL Final   Hemoglobin 12/14/2023 12.6 (L)  13.0 - 17.0 g/dL Final   HCT 98/86/7974 37.0 (L)  39.0 - 52.0 % Final   MCV 12/14/2023 100.3 (H)  78.0 - 100.0 fl Final   MCHC 12/14/2023 34.0  30.0 - 36.0 g/dL Final   RDW  98/86/7974 14.3  11.5 - 15.5 % Final   Platelets 12/14/2023 70.0 (L)  150.0 - 400.0 K/uL Final   Neutrophils Relative % 12/14/2023 67.3  43.0 - 77.0 % Final   Lymphocytes Relative 12/14/2023 16.1  12.0 - 46.0 % Final   Monocytes Relative 12/14/2023 11.9  3.0 - 12.0 % Final   Eosinophils Relative 12/14/2023 3.9  0.0 - 5.0 % Final   Basophils Relative 12/14/2023 0.8  0.0 - 3.0 % Final   Neutro Abs 12/14/2023 4.2  1.4 - 7.7 K/uL Final   Lymphs Abs 12/14/2023 1.0  0.7 - 4.0 K/uL Final   Monocytes Absolute 12/14/2023 0.8  0.1 - 1.0 K/uL Final   Eosinophils Absolute 12/14/2023 0.2  0.0 - 0.7 K/uL Final   Basophils Absolute 12/14/2023 0.0  0.0 - 0.1 K/uL Final   Sodium 12/14/2023 143  135 - 145 mEq/L Final   Potassium 12/14/2023 3.5  3.5 - 5.1 mEq/L Final   Chloride 12/14/2023 107  96 - 112 mEq/L Final   CO2 12/14/2023 26  19 - 32 mEq/L Final   Glucose, Bld 12/14/2023 110 (H)  70 - 99 mg/dL Final   BUN 98/86/7974 45 (H)  6 - 23 mg/dL Final   Creatinine, Ser 12/14/2023 5.98 (HH)  0.40 - 1.50 mg/dL Final   Total Bilirubin 12/14/2023 0.9  0.2 - 1.2 mg/dL Final   Alkaline Phosphatase 12/14/2023 171 (H)  39 - 117 U/L Final   AST 12/14/2023 37  0 - 37 U/L Final   ALT 12/14/2023 39  0 - 53 U/L Final   Total Protein 12/14/2023 6.6  6.0 - 8.3 g/dL Final   Albumin  12/14/2023 3.3 (L)  3.5 - 5.2 g/dL Final   GFR 98/86/7974 10.23 (LL)  >60.00 mL/min Final   Calcium  12/14/2023 9.3  8.4 - 10.5 mg/dL Final   Color, Urine 98/86/7974 YELLOW  Yellow;Lt. Yellow;Straw;Dark Yellow;Amber;Green;Red;Brown Final   APPearance 12/14/2023 CLEAR  Clear;Turbid;Slightly Cloudy;Cloudy Final   Specific Gravity, Urine 12/14/2023 1.020  1.000 - 1.030 Final   pH 12/14/2023 6.5  5.0 - 8.0 Final   Total Protein, Urine 12/14/2023 >=300 (A)  Negative Final   Urine Glucose 12/14/2023 NEGATIVE  Negative Final   Ketones, ur 12/14/2023 NEGATIVE  Negative Final   Bilirubin Urine 12/14/2023 NEGATIVE  Negative Final   Hgb urine  dipstick 12/14/2023 SMALL (A)  Negative Final   Urobilinogen, UA 12/14/2023 0.2  0.0 - 1.0 Final   Leukocytes,Ua 12/14/2023 NEGATIVE  Negative Final  Nitrite 12/14/2023 NEGATIVE  Negative Final   WBC, UA 12/14/2023 0-2/hpf  0-2/hpf Final   RBC / HPF 12/14/2023 3-6/hpf (A)  0-2/hpf Final   Squamous Epithelial / HPF 12/14/2023 Rare(0-4/hpf)  Rare(0-4/hpf) Final  There may be more visits with results that are not included.  No image results found. US  Abdomen Limited RUQ (LIVER/GB) Result Date: 07/18/2024 CLINICAL DATA:  MASH.  Advanced fibrosis. EXAM: ULTRASOUND ABDOMEN LIMITED RIGHT UPPER QUADRANT COMPARISON:  12/24/2023 FINDINGS: Gallbladder: Status post cholecystectomy Common bile duct: Diameter: 8 mm Liver: Parenchymal echogenicity: Diffusely increased Contours: Normal Lesions: None Portal vein: Patent.  Hepatopetal flow Other: None. IMPRESSION: Diffuse increased echogenicity of the hepatic parenchyma is a nonspecific indicator of hepatocellular dysfunction, most commonly steatosis. Electronically Signed   By: Aliene Lloyd M.D.   On: 07/18/2024 09:11         ASSESSMENT & PLAN   Assessment & Plan Nausea Abdominal pain, epigastric Chronic nausea and vomiting with epigastric pain   He experiences chronic nausea and vomiting with occasional epigastric pain. Vomiting episodes have decreased but persist, especially after eating certain foods. Bile duct stones remain a differential despite his cholecystectomy. A GI consultation was completed with no further interventions planned. ERCP was discussed for potential bile duct stones, but he prefers to wait. MRCP is deferred due to potential nephrotoxicity with contrast. Consider surgical referral for ERCP if symptoms persist. Advise dietary modifications to avoid fatty foods. Reassess in 1-2 months or sooner if symptoms worsen. Skin infection Local skin and subcutaneous tissue infection, left pelvic region   A hard nodule in the left pelvic region is  likely an infected lymph node secondary to a skin infection from skin folds. There are no signs of active infection or drainage. Prescribe Keflex  500 mg every 12 hours, starting with half a dose to assess tolerance due to dialysis. Monitor for improvement and adjust the antibiotic if adverse effects occur. ESRD (end stage renal disease) (HCC) End-stage renal disease on hemodialysis   He is well-managed on hemodialysis with improved tolerance after adjusting medication timing. There are no current issues with dialysis sessions. Continue dialysis schedule on Monday, Wednesday, and Friday. Advise taking medications post-dialysis to avoid hypotension and adverse effects. 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 Weight loss could improve his liver condition. There is potential benefit from Ozempic  or Mounjaro  for weight management, pending insurance approval. Ozempic  is known to protect the kidneys, but insurance coverage is a barrier. Resubmit for Ozempic  or Mounjaro  once Medicare is active. Encourage weight loss through dietary modifications. Morbid obesity (HCC) Morbid obesity contributes to multiple health issues including MASH and potentially chronic nausea. Weight management is crucial for overall health improvement. There are potential benefits of Ozempic  or Mounjaro  for weight management, pending insurance approval. Pursue insurance approval for weight management medications such as Ozempic  or Mounjaro . Gastroesophageal reflux disease with esophagitis without hemorrhage Samples Voquezna  given due to unaffordable Inadequate material resources [facility] referral placed 2/20225.  Now has [employer]) next month first payment Encouraged patient to apply for medicaid as well  ORDER ASSOCIATIONS  #   DIAGNOSIS / CONDITION ICD-10 ENCOUNTER ORDER      ICD-10-CM   1. Nausea  R11.0 promethazine  (PHENERGAN ) 25 MG tablet    2. Abdominal pain, epigastric  R10.13 sucralfate  (CARAFATE ) 1 g tablet    promethazine  (PHENERGAN ) 25 MG tablet    3. Skin infection  L08.9 cephALEXin  (KEFLEX )  500 MG capsule     Meds ordered this encounter  Medications   sucralfate  (CARAFATE ) 1 g tablet    Sig: Take 1 tablet (1 g total) by mouth at bedtime as needed (Stomach pain).    Dispense:  90 tablet    Refill:  1    DX Code Needed  .   Vonoprazan Fumarate  (VOQUEZNA ) 20 MG TABS    Sig: Take 20 mg by mouth once for 1 dose.    Dispense:  25 tablet   promethazine  (PHENERGAN ) 25 MG tablet    Sig: Take 1 tablet (25 mg total) by mouth every 8 (eight) hours as needed for nausea or vomiting.    Dispense:  20 tablet    Refill:  0   cephALEXin  (KEFLEX ) 500 MG capsule    Sig: Take 1 capsule (500 mg total) by mouth 2 (two) times daily.    Dispense:  10 capsule    Refill:  0        This document was synthesized by artificial intelligence (Abridge) using HIPAA-compliant recording of the clinical interaction;   We discussed the use of AI scribe software for clinical note transcription with the patient, who gave verbal consent to proceed. additional Info: This encounter employed state-of-the-art, real-time, collaborative documentation. The patient actively reviewed and assisted in updating their electronic medical record on a shared screen, ensuring transparency and facilitating joint problem-solving for the problem list, overview, and plan. This approach promotes accurate, informed care. The treatment plan was discussed and reviewed in detail, including medication safety, potential side effects, and all patient questions. We confirmed understanding and comfort with the plan. Follow-up instructions were established, including contacting the office for any concerns, returning if symptoms worsen, persist, or new symptoms develop, and precautions for potential emergency  department visits.

## 2024-07-21 NOTE — Assessment & Plan Note (Signed)
 [facility] referral placed 2/20225.  Now has [employer] next month first payment Encouraged patient to apply for medicaid as well

## 2024-07-21 NOTE — Assessment & Plan Note (Signed)
 Morbid obesity contributes to multiple health issues including MASH and potentially chronic nausea. Weight management is crucial for overall health improvement. There are potential benefits of Ozempic  or Mounjaro  for weight management, pending insurance approval. Pursue insurance approval for weight management medications such as Ozempic  or Mounjaro .

## 2024-07-21 NOTE — Assessment & Plan Note (Signed)
 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 Weight loss could improve his liver condition. There is potential benefit from Ozempic  or Mounjaro  for weight management, pending insurance approval. Ozempic  is known to protect the kidneys, but insurance coverage is a barrier. Resubmit for Ozempic  or Mounjaro  once Medicare is active. Encourage weight loss through dietary modifications.

## 2024-07-22 ENCOUNTER — Other Ambulatory Visit

## 2024-07-22 DIAGNOSIS — L299 Pruritus, unspecified: Secondary | ICD-10-CM | POA: Diagnosis not present

## 2024-07-22 DIAGNOSIS — Z992 Dependence on renal dialysis: Secondary | ICD-10-CM | POA: Diagnosis not present

## 2024-07-22 DIAGNOSIS — N186 End stage renal disease: Secondary | ICD-10-CM | POA: Diagnosis not present

## 2024-07-22 DIAGNOSIS — N2581 Secondary hyperparathyroidism of renal origin: Secondary | ICD-10-CM | POA: Diagnosis not present

## 2024-07-22 DIAGNOSIS — R52 Pain, unspecified: Secondary | ICD-10-CM | POA: Diagnosis not present

## 2024-07-22 LAB — MULTIPLE MYELOMA PANEL, SERUM
Albumin SerPl Elph-Mcnc: 3.3 g/dL (ref 2.9–4.4)
Albumin/Glob SerPl: 1.2 (ref 0.7–1.7)
Alpha 1: 0.3 g/dL (ref 0.0–0.4)
Alpha2 Glob SerPl Elph-Mcnc: 0.6 g/dL (ref 0.4–1.0)
B-Globulin SerPl Elph-Mcnc: 1.6 g/dL — ABNORMAL HIGH (ref 0.7–1.3)
Gamma Glob SerPl Elph-Mcnc: 0.5 g/dL (ref 0.4–1.8)
Globulin, Total: 2.9 g/dL (ref 2.2–3.9)
IgA: 1064 mg/dL — ABNORMAL HIGH (ref 90–386)
IgG (Immunoglobin G), Serum: 659 mg/dL (ref 603–1613)
IgM (Immunoglobulin M), Srm: 83 mg/dL (ref 20–172)
M Protein SerPl Elph-Mcnc: 0.7 g/dL — ABNORMAL HIGH
Total Protein ELP: 6.2 g/dL (ref 6.0–8.5)

## 2024-07-25 DIAGNOSIS — R52 Pain, unspecified: Secondary | ICD-10-CM | POA: Diagnosis not present

## 2024-07-25 DIAGNOSIS — N2581 Secondary hyperparathyroidism of renal origin: Secondary | ICD-10-CM | POA: Diagnosis not present

## 2024-07-25 DIAGNOSIS — L299 Pruritus, unspecified: Secondary | ICD-10-CM | POA: Diagnosis not present

## 2024-07-25 DIAGNOSIS — N186 End stage renal disease: Secondary | ICD-10-CM | POA: Diagnosis not present

## 2024-07-25 DIAGNOSIS — Z992 Dependence on renal dialysis: Secondary | ICD-10-CM | POA: Diagnosis not present

## 2024-07-25 NOTE — Progress Notes (Unsigned)
 [facility]  [address] [place],  [place]  [address] [phone]  Clinic Day:  07/26/2024  Referring physician: Jesus Bernardino MATSU, MD   HISTORY OF PRESENT ILLNESS:  The patient is a 53 y.o. 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  He also has  MGUS.  A bone marrow biopsy done in May 2024 revealed 7% plasma cells.  These hematologic disorders have been followed conservatively as they have not led to any changes in his health.  He comes in today for routine follow-up.  As he has  end-stage renal disease, he is on hemodialysis.  Overall, the patient remains in good spirits. 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  PHYSICAL EXAM:  Blood pressure (!) 128/56, pulse 72, temperature 98.1 F (36.7 C), temperature source Oral, resp. rate 16, height 5' 11 (1.803 m), weight (!) 368 lb 3.2 oz (167 kg), SpO2 97%.Body mass index is 51.35 kg/m.  Performance status (ECOG): 1 - Symptomatic but completely ambulatory Physical Exam Constitutional:      Appearance: Normal appearance. He is obese. He is not ill-appearing.  HENT:     Mouth/Throat:     Mouth: Mucous membranes are moist.     Pharynx: Oropharynx is clear. No oropharyngeal exudate or posterior oropharyngeal erythema.  Cardiovascular:     Rate and Rhythm: Normal rate and regular rhythm.     Heart sounds: No murmur heard.    No friction rub. No gallop.  Pulmonary:     Effort: Pulmonary effort is normal. No respiratory distress.     Breath sounds: Normal breath sounds. No wheezing, rhonchi or rales.  Abdominal:     General: Bowel sounds are normal. There is no distension.     Palpations: Abdomen is soft. There is no mass.     Tenderness: There is no abdominal tenderness.  Musculoskeletal:        General: No swelling.     Right lower leg: No edema.     Left lower  leg: No edema.  Lymphadenopathy:     Cervical: No cervical adenopathy.     Upper Body:     Right upper body: No supraclavicular or axillary adenopathy.     Left upper body: No supraclavicular or axillary adenopathy.     Lower Body: No right inguinal adenopathy. No left inguinal adenopathy.  Skin:    General: Skin is warm.     Coloration: Skin is not jaundiced.     Findings: No lesion or rash.  Neurological:     General: No focal deficit present.     Mental Status: He is alert and oriented to person, place, and time. Mental status is at baseline.  Psychiatric:        Mood and Affect: Mood normal.        Behavior: Behavior normal.        Thought Content: Thought content normal.   LABS:  Latest Reference Range & Units 07/19/24 08:24  WBC 4.0 - 10.5 K/uL 4.8  RBC 4.22 - 5.81 MIL/uL 2.65 (L)  Hemoglobin 13.0 - 17.0 g/dL 9.6 (L)  HCT 60.9 - 47.9 % 28.8 (L)  MCV 80.0 - 100.0 fL 108.7 (H)  MCH 26.0 - 34.0 pg 36.2 (H)  MCHC 30.0 - 36.0 g/dL 66.6  RDW 88.4 - 84.4 % 15.6 (H)  Platelets 150 - 400 K/uL  58 (L)  nRBC 0.0 - 0.2 % 0.0  Neutrophils % 60  Lymphocytes % 21  Monocytes Relative % 13  Eosinophil % 4  Basophil % 1  Immature Granulocytes % 1  (L): Data is abnormally low (H): Data is abnormally high   Latest Reference Range & Units 07/19/24 08:24  Sodium 135 - 145 mmol/L 139  Potassium 3.5 - 5.1 mmol/L 3.5  Chloride 98 - 111 mmol/L 99  CO2 22 - 32 mmol/L 25  Glucose 70 - 99 mg/dL 847 (H)  BUN 6 - 20 mg/dL 44 (H)  Creatinine 9.38 - 1.24 mg/dL 3.92 (H)  Calcium  8.9 - 10.3 mg/dL 9.7  Anion gap 5 - 15  15  Alkaline Phosphatase 38 - 126 U/L 286 (H)  Albumin  3.5 - 5.0 g/dL 3.4 (L)  AST 15 - 41 U/L 87 (H)  ALT 0 - 44 U/L 84 (H)  Total Protein 6.5 - 8.1 g/dL 6.5  Total Bilirubin 0.0 - 1.2 mg/dL 2.1 (H)  GFR, Est Non African American >60 mL/min 10 (L)  (H): Data is abnormally high (L): Data is abnormally low   Latest Reference Range & Units 07/19/24 08:25 07/19/24 08:26   Iron 45 - 182 ug/dL  867  UIBC ug/dL  743  TIBC 749 - 549 ug/dL  611  Saturation Ratios 17.9 - 39.5 %  34  Ferritin 24 - 336 ng/mL  547 (H)  Folate >5.9 ng/mL 20.2   (H): Data is abnormally high  Latest Reference Range & Units 01/18/24 08:52 07/19/24 08:25  M Protein SerPl Elph-Mcnc Not Observed g/dL 0.9 (H) (C) 0.7 (H) (C)  IgG (Immunoglobin G), Serum 603 - 1,613 mg/dL 438 (L) 340  IgA 90 - 613 mg/dL 8,796 (H) 8,935 (H)  (H): Data is abnormally high (L): Data is abnormally low Immunofixation shows IgA monoclonal protein with kappa light chain specificity.  Immunofixation shows IgG monoclonal protein with kappa light chain specificity.   Latest Reference Range & Units 01/18/24 08:51 07/19/24 08:24  Kappa free light chain 3.3 - 19.4 mg/L 1,099.9 (H) 1,279.1 (H)  (H): Data is abnormally high  ASSESSMENT & PLAN:  Assessment/Plan:  A 53 y.o. 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  He also has MGUS.  His platelet count of 58 today is not much different versus what it has been over these past few years.  Based upon this, his thrombocytopenia will continue to be followed conservatively.  When evaluating his monoclonal parameters, his M spike is slightly lower.  There have also been no significant changes in his immunoglobulin levels.  There has been a rise in his kappa light chain level.  However, there has also been a concurrent rise in his lambda light chain level, which is seen in a reactive process.  I have seen a rise in both kappa and lambda light chains when an acute process, such as end-stage renal disease, is present.  Overall, I do not sense that his MGUS has transformed into multiple myeloma.  His monoclonal parameters will continue to be followed to ensure this remains the case.  I will see him back in 6 months for repeat clinical assessment.  The patient understands all the plans discussed today and is in agreement with them.    Mckaylie Vasey DELENA Kerns, MD

## 2024-07-26 ENCOUNTER — Telehealth: Payer: Self-pay | Admitting: Oncology

## 2024-07-26 ENCOUNTER — Inpatient Hospital Stay (HOSPITAL_BASED_OUTPATIENT_CLINIC_OR_DEPARTMENT_OTHER): Payer: Self-pay | Admitting: Oncology

## 2024-07-26 ENCOUNTER — Other Ambulatory Visit: Payer: Self-pay | Admitting: Oncology

## 2024-07-26 VITALS — BP 128/56 | HR 72 | Temp 98.1°F | Resp 16 | Ht 71.0 in | Wt 368.2 lb

## 2024-07-26 DIAGNOSIS — Z992 Dependence on renal dialysis: Secondary | ICD-10-CM | POA: Diagnosis not present

## 2024-07-26 DIAGNOSIS — R161 Splenomegaly, not elsewhere classified: Secondary | ICD-10-CM | POA: Diagnosis not present

## 2024-07-26 DIAGNOSIS — K7581 Nonalcoholic steatohepatitis (NASH): Secondary | ICD-10-CM | POA: Diagnosis not present

## 2024-07-26 DIAGNOSIS — D472 Monoclonal gammopathy: Secondary | ICD-10-CM

## 2024-07-26 DIAGNOSIS — N186 End stage renal disease: Secondary | ICD-10-CM | POA: Diagnosis not present

## 2024-07-26 DIAGNOSIS — D6959 Other secondary thrombocytopenia: Secondary | ICD-10-CM | POA: Diagnosis not present

## 2024-07-26 DIAGNOSIS — K7469 Other cirrhosis of liver: Secondary | ICD-10-CM | POA: Diagnosis not present

## 2024-07-26 DIAGNOSIS — E669 Obesity, unspecified: Secondary | ICD-10-CM | POA: Diagnosis not present

## 2024-07-26 DIAGNOSIS — K7460 Unspecified cirrhosis of liver: Secondary | ICD-10-CM | POA: Diagnosis not present

## 2024-07-26 NOTE — Telephone Encounter (Signed)
 Patient has been scheduled for follow-up visit per 07/22/24 LOS.  Pt aware of scheduled appt details.

## 2024-07-27 DIAGNOSIS — R52 Pain, unspecified: Secondary | ICD-10-CM | POA: Diagnosis not present

## 2024-07-27 DIAGNOSIS — L299 Pruritus, unspecified: Secondary | ICD-10-CM | POA: Diagnosis not present

## 2024-07-27 DIAGNOSIS — N186 End stage renal disease: Secondary | ICD-10-CM | POA: Diagnosis not present

## 2024-07-27 DIAGNOSIS — Z992 Dependence on renal dialysis: Secondary | ICD-10-CM | POA: Diagnosis not present

## 2024-07-27 DIAGNOSIS — N2581 Secondary hyperparathyroidism of renal origin: Secondary | ICD-10-CM | POA: Diagnosis not present

## 2024-07-29 ENCOUNTER — Ambulatory Visit: Admitting: Oncology

## 2024-07-29 DIAGNOSIS — N186 End stage renal disease: Secondary | ICD-10-CM | POA: Diagnosis not present

## 2024-07-29 DIAGNOSIS — Z992 Dependence on renal dialysis: Secondary | ICD-10-CM | POA: Diagnosis not present

## 2024-07-29 DIAGNOSIS — N2581 Secondary hyperparathyroidism of renal origin: Secondary | ICD-10-CM | POA: Diagnosis not present

## 2024-07-29 DIAGNOSIS — L299 Pruritus, unspecified: Secondary | ICD-10-CM | POA: Diagnosis not present

## 2024-07-29 DIAGNOSIS — R52 Pain, unspecified: Secondary | ICD-10-CM | POA: Diagnosis not present

## 2024-07-31 DIAGNOSIS — Z992 Dependence on renal dialysis: Secondary | ICD-10-CM | POA: Diagnosis not present

## 2024-07-31 DIAGNOSIS — I509 Heart failure, unspecified: Secondary | ICD-10-CM | POA: Diagnosis not present

## 2024-07-31 DIAGNOSIS — N186 End stage renal disease: Secondary | ICD-10-CM | POA: Diagnosis not present

## 2024-08-01 ENCOUNTER — Other Ambulatory Visit: Payer: Self-pay | Admitting: Nurse Practitioner

## 2024-08-01 DIAGNOSIS — N186 End stage renal disease: Secondary | ICD-10-CM | POA: Diagnosis not present

## 2024-08-01 DIAGNOSIS — L299 Pruritus, unspecified: Secondary | ICD-10-CM | POA: Diagnosis not present

## 2024-08-01 DIAGNOSIS — N2581 Secondary hyperparathyroidism of renal origin: Secondary | ICD-10-CM | POA: Diagnosis not present

## 2024-08-01 DIAGNOSIS — R52 Pain, unspecified: Secondary | ICD-10-CM | POA: Diagnosis not present

## 2024-08-01 DIAGNOSIS — Z992 Dependence on renal dialysis: Secondary | ICD-10-CM | POA: Diagnosis not present

## 2024-08-02 ENCOUNTER — Other Ambulatory Visit: Payer: Self-pay

## 2024-08-02 ENCOUNTER — Inpatient Hospital Stay (HOSPITAL_COMMUNITY)
Admission: EM | Admit: 2024-08-02 | Discharge: 2024-08-06 | DRG: 441 | Disposition: A | Discharge status: Home / Self Care | Attending: Hospitalist | Admitting: Hospitalist

## 2024-08-02 DIAGNOSIS — I132 Hypertensive heart and chronic kidney disease with heart failure and with stage 5 chronic kidney disease, or end stage renal disease: Secondary | ICD-10-CM | POA: Diagnosis present

## 2024-08-02 DIAGNOSIS — E876 Hypokalemia: Secondary | ICD-10-CM | POA: Diagnosis not present

## 2024-08-02 DIAGNOSIS — K5909 Other constipation: Secondary | ICD-10-CM | POA: Diagnosis present

## 2024-08-02 DIAGNOSIS — I2510 Atherosclerotic heart disease of native coronary artery without angina pectoris: Secondary | ICD-10-CM | POA: Diagnosis present

## 2024-08-02 DIAGNOSIS — K7682 Hepatic encephalopathy: Principal | ICD-10-CM | POA: Diagnosis present

## 2024-08-02 DIAGNOSIS — D631 Anemia in chronic kidney disease: Secondary | ICD-10-CM | POA: Diagnosis not present

## 2024-08-02 DIAGNOSIS — E7800 Pure hypercholesterolemia, unspecified: Secondary | ICD-10-CM | POA: Diagnosis not present

## 2024-08-02 DIAGNOSIS — N2581 Secondary hyperparathyroidism of renal origin: Secondary | ICD-10-CM | POA: Diagnosis not present

## 2024-08-02 DIAGNOSIS — N390 Urinary tract infection, site not specified: Secondary | ICD-10-CM | POA: Diagnosis present

## 2024-08-02 DIAGNOSIS — G4733 Obstructive sleep apnea (adult) (pediatric): Secondary | ICD-10-CM | POA: Diagnosis present

## 2024-08-02 DIAGNOSIS — Z951 Presence of aortocoronary bypass graft: Secondary | ICD-10-CM

## 2024-08-02 DIAGNOSIS — I5042 Chronic combined systolic (congestive) and diastolic (congestive) heart failure: Secondary | ICD-10-CM | POA: Diagnosis not present

## 2024-08-02 DIAGNOSIS — Z888 Allergy status to other drugs, medicaments and biological substances status: Secondary | ICD-10-CM

## 2024-08-02 DIAGNOSIS — R4182 Altered mental status, unspecified: Secondary | ICD-10-CM | POA: Diagnosis not present

## 2024-08-02 DIAGNOSIS — I480 Paroxysmal atrial fibrillation: Secondary | ICD-10-CM | POA: Diagnosis present

## 2024-08-02 DIAGNOSIS — K7290 Hepatic failure, unspecified without coma: Secondary | ICD-10-CM | POA: Diagnosis present

## 2024-08-02 DIAGNOSIS — Z883 Allergy status to other anti-infective agents status: Secondary | ICD-10-CM

## 2024-08-02 DIAGNOSIS — Z79620 Long term (current) use of immunosuppressive biologic: Secondary | ICD-10-CM

## 2024-08-02 DIAGNOSIS — K769 Liver disease, unspecified: Secondary | ICD-10-CM | POA: Diagnosis not present

## 2024-08-02 DIAGNOSIS — E7220 Disorder of urea cycle metabolism, unspecified: Secondary | ICD-10-CM | POA: Diagnosis not present

## 2024-08-02 DIAGNOSIS — Z7985 Long-term (current) use of injectable non-insulin antidiabetic drugs: Secondary | ICD-10-CM | POA: Diagnosis not present

## 2024-08-02 DIAGNOSIS — K219 Gastro-esophageal reflux disease without esophagitis: Secondary | ICD-10-CM | POA: Diagnosis present

## 2024-08-02 DIAGNOSIS — I10 Essential (primary) hypertension: Secondary | ICD-10-CM | POA: Diagnosis not present

## 2024-08-02 DIAGNOSIS — Z8249 Family history of ischemic heart disease and other diseases of the circulatory system: Secondary | ICD-10-CM | POA: Diagnosis not present

## 2024-08-02 DIAGNOSIS — Z9049 Acquired absence of other specified parts of digestive tract: Secondary | ICD-10-CM | POA: Diagnosis not present

## 2024-08-02 DIAGNOSIS — Z992 Dependence on renal dialysis: Secondary | ICD-10-CM

## 2024-08-02 DIAGNOSIS — Z91048 Other nonmedicinal substance allergy status: Secondary | ICD-10-CM

## 2024-08-02 DIAGNOSIS — Z833 Family history of diabetes mellitus: Secondary | ICD-10-CM

## 2024-08-02 DIAGNOSIS — I5032 Chronic diastolic (congestive) heart failure: Secondary | ICD-10-CM | POA: Diagnosis present

## 2024-08-02 DIAGNOSIS — N186 End stage renal disease: Secondary | ICD-10-CM | POA: Diagnosis not present

## 2024-08-02 DIAGNOSIS — R404 Transient alteration of awareness: Secondary | ICD-10-CM | POA: Diagnosis not present

## 2024-08-02 DIAGNOSIS — Z79899 Other long term (current) drug therapy: Secondary | ICD-10-CM

## 2024-08-02 DIAGNOSIS — M109 Gout, unspecified: Secondary | ICD-10-CM | POA: Diagnosis not present

## 2024-08-02 DIAGNOSIS — Z881 Allergy status to other antibiotic agents status: Secondary | ICD-10-CM

## 2024-08-02 DIAGNOSIS — R410 Disorientation, unspecified: Secondary | ICD-10-CM | POA: Diagnosis not present

## 2024-08-02 DIAGNOSIS — Z7901 Long term (current) use of anticoagulants: Secondary | ICD-10-CM

## 2024-08-02 DIAGNOSIS — K5900 Constipation, unspecified: Secondary | ICD-10-CM | POA: Diagnosis not present

## 2024-08-02 DIAGNOSIS — I4891 Unspecified atrial fibrillation: Secondary | ICD-10-CM | POA: Diagnosis present

## 2024-08-02 DIAGNOSIS — K7460 Unspecified cirrhosis of liver: Secondary | ICD-10-CM | POA: Diagnosis not present

## 2024-08-02 DIAGNOSIS — I120 Hypertensive chronic kidney disease with stage 5 chronic kidney disease or end stage renal disease: Secondary | ICD-10-CM | POA: Diagnosis not present

## 2024-08-02 DIAGNOSIS — Z6841 Body Mass Index (BMI) 40.0 and over, adult: Secondary | ICD-10-CM | POA: Diagnosis not present

## 2024-08-02 DIAGNOSIS — G9340 Encephalopathy, unspecified: Secondary | ICD-10-CM | POA: Diagnosis not present

## 2024-08-02 DIAGNOSIS — D539 Nutritional anemia, unspecified: Secondary | ICD-10-CM | POA: Diagnosis present

## 2024-08-02 DIAGNOSIS — Z880 Allergy status to penicillin: Secondary | ICD-10-CM

## 2024-08-02 LAB — I-STAT CHEM 8, ED
BUN: 48 mg/dL — ABNORMAL HIGH (ref 6–20)
Calcium, Ion: 1.16 mmol/L (ref 1.15–1.40)
Chloride: 98 mmol/L (ref 98–111)
Creatinine, Ser: 6.7 mg/dL — ABNORMAL HIGH (ref 0.61–1.24)
Glucose, Bld: 151 mg/dL — ABNORMAL HIGH (ref 70–99)
HCT: 33 % — ABNORMAL LOW (ref 39.0–52.0)
Hemoglobin: 11.2 g/dL — ABNORMAL LOW (ref 13.0–17.0)
Potassium: 2.9 mmol/L — ABNORMAL LOW (ref 3.5–5.1)
Sodium: 135 mmol/L (ref 135–145)
TCO2: 22 mmol/L (ref 22–32)

## 2024-08-02 NOTE — ED Provider Notes (Signed)
 [place] EMERGENCY DEPARTMENT AT [facility] Provider Note   CSN: [id number] Arrival date & time: 08/02/24  2311     Patient presents with: Altered Mental Status   Sean [name] is a 53 y.o. male.  {Add pertinent medical, surgical, social history, OB history to HPI:5340} 53 year old male with history of ESRD on dialysis Monday, Wednesday, Saturday as well as hyperlipidemia, hypertension, obesity, CAD, CHF, A-fib on Eliquis , fatty liver disease brought in by EMS from home for altered mental status.  Family states patient has been acting drunk since yesterday although does not drink alcohol.  Denies substance abuse, falls, fevers.  Patient states that he feels fine.  Family states that he is fidgety, has not slept since Sunday, no history of insomnia/mania.        Prior to Admission medications   Medication Sig Start Date End Date Taking? Authorizing Provider  allopurinol  (ZYLOPRIM ) 100 MG tablet TAKE 1 TABLET BY MOUTH EVERY DAY 05/18/24   Jesus Bernardino MATSU, MD  amitriptyline  (ELAVIL ) 25 MG tablet TAKE 1 TABLET BY MOUTH EVERYDAY AT BEDTIME 08/02/24   May, Deanna J, NP  azelastine  (ASTELIN ) 0.1 % nasal spray Place 2 sprays into both nostrils 2 (two) times daily. Use in each nostril as directed 06/16/24   Tobie Eldora NOVAK, MD  B Complex-C-Folic Acid  (DIALYVITE  TABLET) TABS Take 1 tablet by mouth daily. 05/02/24   [provider]  cephALEXin  (KEFLEX ) 500 MG capsule Take 1 capsule (500 mg total) by mouth 2 (two) times daily. 07/21/24   Jesus Bernardino MATSU, MD  Cyanocobalamin  (VITAMIN B-12) 5000 MCG SUBL Place 5,000 mcg under the tongue daily.    [provider]  diltiazem  (CARDIZEM  SR) 120 MG 12 hr capsule TAKE 1 CAPSULE BY MOUTH TWICE A DAY 07/09/24   Jesus Bernardino MATSU, MD  ELIQUIS  5 MG TABS tablet TAKE 1 TABLET(5 MG) BY MOUTH TWICE DAILY 01/26/24   Chandrasekhar, Mahesh A, MD  Evolocumab  (REPATHA ) 140 MG/ML SOSY Inject 140 mg into the skin every 14 (fourteen) days.    [provider]  folic acid -vitamin b complex-vitamin c-selenium-zinc (DIALYVITE ) 3 MG TABS tablet Take 1 tablet by mouth daily.    [provider]  iron sucrose in sodium chloride  0.9 % 100 mL Iron Sucrose (Venofer) 02/20/24 02/18/25  [provider]  ketoconazole  (NIZORAL ) 2 % cream APPLY TO AFFECTED AREA TWICE A DAY 05/18/24   Jesus Bernardino MATSU, MD  lanthanum  (FOSRENOL ) 1000 MG chewable tablet Chew 1,000 mg by mouth 3 (three) times daily with meals. 04/19/24 04/19/25  [provider]  losartan  (COZAAR ) 50 MG tablet Take 50 mg by mouth daily. 05/20/24   [provider]  Methoxy PEG-Epoetin Beta (MIRCERA IJ) 75 mcg. 04/02/24 04/01/25  [provider]  metoprolol  succinate (TOPROL -XL) 25 MG 24 hr tablet Take 25 mg by mouth daily. 07/07/24   [provider]  montelukast  (SINGULAIR ) 10 MG tablet TAKE 1 TABLET BY MOUTH EVERY DAY 05/18/24   Jesus Bernardino MATSU, MD  nitroGLYCERIN  (NITROSTAT ) 0.4 MG SL tablet Place 1 tablet (0.4 mg total) under the tongue every 5 (five) minutes as needed for chest pain. 02/10/22   Chandrasekhar, Stanly LABOR, MD  ondansetron  (ZOFRAN -ODT) 4 MG disintegrating tablet Take 1 tablet (4 mg total) by mouth every 8 (eight) hours as needed for nausea or vomiting. 12/16/23   Jesus Bernardino MATSU, MD  oxyCODONE -acetaminophen  (PERCOCET) 5-325 MG tablet Take 1 tablet by mouth every 6 (six) hours as needed for severe pain (pain score  7-10). 05/06/24   Rhyne, Lucie PARAS, PA-C  pantoprazole  (PROTONIX ) 40 MG tablet Take 40 mg by mouth 2 (two) times daily. 06/28/24   [provider]  promethazine  (PHENERGAN ) 25 MG tablet Take 1 tablet (25 mg total) by mouth every 8 (eight) hours as needed for nausea or vomiting. 07/21/24   Jesus Bernardino MATSU, MD  sucralfate  (CARAFATE ) 1 g tablet Take 1 tablet (1 g total) by mouth at bedtime as needed (Stomach pain). 07/21/24   Jesus Bernardino MATSU, MD  sulfamethoxazole -trimethoprim  (BACTRIM  DS) 800-160 MG tablet Take 1 tablet by mouth  daily for 10 days. 07/05/24 07/15/24  Jesus Bernardino MATSU, MD  tirzepatide  (MOUNJARO ) 2.5 MG/0.5ML Pen Inject 2.5 mg into the skin once a week. Patient not taking: Reported on 07/14/2024 06/18/24   Jesus Bernardino MATSU, MD  torsemide  (DEMADEX ) 10 MG tablet Take 10 mg by mouth daily.    [provider]    Allergies: Glucophage  [metformin ], Vibra -tab [doxycycline ], Chlorhexidine , Chlorhexidine  gluconate, Augmentin [amoxicillin-pot clavulanate], Imdur  [isosorbide  dinitrate], Tape, and Valacyclovir     Review of Systems Level 5 caveat for change in mental status  Updated Vital Signs Ht 5' 11 (1.803 m)   Wt (!) 167 kg   BMI 51.35 kg/m   Physical Exam Vitals and nursing note reviewed.  Constitutional:      General: He is not in acute distress.    Appearance: He is well-developed. He is obese. He is not diaphoretic.  HENT:     Head: Normocephalic and atraumatic.     Mouth/Throat:     Mouth: Mucous membranes are dry.  Eyes:     General: No scleral icterus.    Extraocular Movements: Extraocular movements intact.     Pupils: Pupils are equal, round, and reactive to light.  Cardiovascular:     Rate and Rhythm: Normal rate and regular rhythm.     Heart sounds: Normal heart sounds.  Pulmonary:     Effort: Pulmonary effort is normal.     Breath sounds: Normal breath sounds.  Abdominal:     Palpations: Abdomen is soft.     Tenderness: There is no abdominal tenderness.  Musculoskeletal:     Right lower leg: Edema present.     Left lower leg: Edema present.  Skin:    General: Skin is warm and dry.     Findings: Bruising present.  Neurological:     Mental Status: He is alert and oriented to person, place, and time.     Comments: +asterixis   Psychiatric:        Behavior: Behavior normal.     (all labs ordered are listed, but only abnormal results are displayed) Labs Reviewed  COMPREHENSIVE METABOLIC PANEL WITH GFR  CBC  URINALYSIS, ROUTINE W REFLEX MICROSCOPIC  AMMONIA  RAPID  URINE DRUG SCREEN, HOSP PERFORMED  ETHANOL  PROTIME-INR  CBG MONITORING, ED  I-STAT CHEM 8, ED    EKG: None  Radiology: No results found.  {Document cardiac monitor, telemetry assessment procedure when appropriate:32947} Procedures   Medications Ordered in the ED - No data to display    {Click here for ABCD2, HEART and other calculators REFRESH Note before signing:1}                              Medical Decision Making Amount and/or Complexity of Data Reviewed Labs: ordered.   This patient presents to the ED for concern of ***, this involves an extensive number of  treatment options, and is a complaint that carries with it a high risk of complications and morbidity.  The differential diagnosis includes ***   Co morbidities / Chronic conditions that complicate the patient evaluation  ***   Additional history obtained:  Additional history obtained from EMR External records from outside source obtained and reviewed including ***   Lab Tests:  I Ordered, and personally interpreted labs.  The pertinent results include:  ***   Imaging Studies ordered:  I ordered imaging studies including ***  I independently visualized and interpreted imaging which showed *** I agree with the radiologist interpretation   Cardiac Monitoring: / EKG:  The patient was maintained on a cardiac monitor.  I personally viewed and interpreted the cardiac monitored which showed an underlying rhythm of: ***   Problem List / ED Course / Critical interventions / Medication management  *** I ordered medication including ***   Reevaluation of the patient after these medicines showed that the patient *** I have reviewed the patients home medicines and have made adjustments as needed   Consultations Obtained:  I requested consultation with the ***,  and discussed lab and imaging findings as well as pertinent plan - they recommend: ***   Social Determinants of Health:  ***   Test /  Admission - Considered:  ***   {Document critical care time when appropriate  Document review of labs and clinical decision tools ie CHADS2VASC2, etc  Document your independent review of radiology images and any outside records  Document your discussion with family members, caretakers and with consultants  Document social determinants of health affecting pt's care  Document your decision making why or why not admission, treatments were needed:32947:::1}   Final diagnoses:  None    ED Discharge Orders     None

## 2024-08-02 NOTE — ED Triage Notes (Signed)
 Pt BIB [facility] from home for altered mental status, per family, pt has been scratching, not eating or drinking like normal, as been altered since dialysis yesterday. Pt says he feels fine but eating and drinking is less than normal    CBG 214 190/90  HR 86 96% RA 98.1

## 2024-08-03 ENCOUNTER — Encounter (HOSPITAL_COMMUNITY): Payer: Self-pay | Admitting: Internal Medicine

## 2024-08-03 ENCOUNTER — Emergency Department (HOSPITAL_COMMUNITY)

## 2024-08-03 DIAGNOSIS — R4182 Altered mental status, unspecified: Secondary | ICD-10-CM | POA: Diagnosis not present

## 2024-08-03 DIAGNOSIS — K7460 Unspecified cirrhosis of liver: Secondary | ICD-10-CM | POA: Diagnosis present

## 2024-08-03 DIAGNOSIS — Z992 Dependence on renal dialysis: Secondary | ICD-10-CM | POA: Diagnosis not present

## 2024-08-03 DIAGNOSIS — E876 Hypokalemia: Secondary | ICD-10-CM | POA: Diagnosis present

## 2024-08-03 DIAGNOSIS — K769 Liver disease, unspecified: Secondary | ICD-10-CM

## 2024-08-03 DIAGNOSIS — K5900 Constipation, unspecified: Secondary | ICD-10-CM | POA: Diagnosis not present

## 2024-08-03 DIAGNOSIS — N390 Urinary tract infection, site not specified: Secondary | ICD-10-CM

## 2024-08-03 DIAGNOSIS — Z6841 Body Mass Index (BMI) 40.0 and over, adult: Secondary | ICD-10-CM | POA: Diagnosis not present

## 2024-08-03 DIAGNOSIS — Z8249 Family history of ischemic heart disease and other diseases of the circulatory system: Secondary | ICD-10-CM | POA: Diagnosis not present

## 2024-08-03 DIAGNOSIS — I480 Paroxysmal atrial fibrillation: Secondary | ICD-10-CM | POA: Diagnosis present

## 2024-08-03 DIAGNOSIS — K7682 Hepatic encephalopathy: Secondary | ICD-10-CM | POA: Diagnosis present

## 2024-08-03 DIAGNOSIS — N2581 Secondary hyperparathyroidism of renal origin: Secondary | ICD-10-CM | POA: Diagnosis present

## 2024-08-03 DIAGNOSIS — Z881 Allergy status to other antibiotic agents status: Secondary | ICD-10-CM | POA: Diagnosis not present

## 2024-08-03 DIAGNOSIS — I2510 Atherosclerotic heart disease of native coronary artery without angina pectoris: Secondary | ICD-10-CM | POA: Diagnosis present

## 2024-08-03 DIAGNOSIS — M109 Gout, unspecified: Secondary | ICD-10-CM | POA: Diagnosis present

## 2024-08-03 DIAGNOSIS — I5042 Chronic combined systolic (congestive) and diastolic (congestive) heart failure: Secondary | ICD-10-CM | POA: Diagnosis present

## 2024-08-03 DIAGNOSIS — I132 Hypertensive heart and chronic kidney disease with heart failure and with stage 5 chronic kidney disease, or end stage renal disease: Secondary | ICD-10-CM | POA: Diagnosis present

## 2024-08-03 DIAGNOSIS — K7290 Hepatic failure, unspecified without coma: Secondary | ICD-10-CM | POA: Diagnosis present

## 2024-08-03 DIAGNOSIS — D631 Anemia in chronic kidney disease: Secondary | ICD-10-CM | POA: Diagnosis present

## 2024-08-03 DIAGNOSIS — Z7985 Long-term (current) use of injectable non-insulin antidiabetic drugs: Secondary | ICD-10-CM | POA: Diagnosis not present

## 2024-08-03 DIAGNOSIS — K219 Gastro-esophageal reflux disease without esophagitis: Secondary | ICD-10-CM | POA: Diagnosis present

## 2024-08-03 DIAGNOSIS — E7220 Disorder of urea cycle metabolism, unspecified: Secondary | ICD-10-CM | POA: Diagnosis not present

## 2024-08-03 DIAGNOSIS — N186 End stage renal disease: Secondary | ICD-10-CM | POA: Diagnosis present

## 2024-08-03 DIAGNOSIS — D539 Nutritional anemia, unspecified: Secondary | ICD-10-CM | POA: Diagnosis present

## 2024-08-03 DIAGNOSIS — Z833 Family history of diabetes mellitus: Secondary | ICD-10-CM | POA: Diagnosis not present

## 2024-08-03 DIAGNOSIS — K5909 Other constipation: Secondary | ICD-10-CM | POA: Diagnosis present

## 2024-08-03 DIAGNOSIS — Z7901 Long term (current) use of anticoagulants: Secondary | ICD-10-CM | POA: Diagnosis not present

## 2024-08-03 DIAGNOSIS — E7800 Pure hypercholesterolemia, unspecified: Secondary | ICD-10-CM | POA: Diagnosis present

## 2024-08-03 HISTORY — DX: Urinary tract infection, site not specified: N39.0

## 2024-08-03 HISTORY — DX: Hepatic encephalopathy: K76.82

## 2024-08-03 LAB — COMPREHENSIVE METABOLIC PANEL WITH GFR
ALT: 56 U/L — ABNORMAL HIGH (ref 0–44)
AST: 75 U/L — ABNORMAL HIGH (ref 15–41)
Albumin: 3.1 g/dL — ABNORMAL LOW (ref 3.5–5.0)
Alkaline Phosphatase: 217 U/L — ABNORMAL HIGH (ref 38–126)
Anion gap: 16 — ABNORMAL HIGH (ref 5–15)
BUN: 53 mg/dL — ABNORMAL HIGH (ref 6–20)
CO2: 22 mmol/L (ref 22–32)
Calcium: 10.2 mg/dL (ref 8.9–10.3)
Chloride: 96 mmol/L — ABNORMAL LOW (ref 98–111)
Creatinine, Ser: 6.94 mg/dL — ABNORMAL HIGH (ref 0.61–1.24)
GFR, Estimated: 9 mL/min — ABNORMAL LOW (ref >60)
Glucose, Bld: 154 mg/dL — ABNORMAL HIGH (ref 70–99)
Potassium: 2.9 mmol/L — ABNORMAL LOW (ref 3.5–5.1)
Sodium: 134 mmol/L — ABNORMAL LOW (ref 135–145)
Total Bilirubin: 2.2 mg/dL — ABNORMAL HIGH (ref 0.0–1.2)
Total Protein: 7.1 g/dL (ref 6.5–8.1)

## 2024-08-03 LAB — CBC
HCT: 31.4 % — ABNORMAL LOW (ref 39.0–52.0)
HCT: 32.1 % — ABNORMAL LOW (ref 39.0–52.0)
Hemoglobin: 10.7 g/dL — ABNORMAL LOW (ref 13.0–17.0)
Hemoglobin: 10.8 g/dL — ABNORMAL LOW (ref 13.0–17.0)
MCH: 35.2 pg — ABNORMAL HIGH (ref 26.0–34.0)
MCH: 36.1 pg — ABNORMAL HIGH (ref 26.0–34.0)
MCHC: 33.3 g/dL (ref 30.0–36.0)
MCHC: 34.4 g/dL (ref 30.0–36.0)
MCV: 105 fL — ABNORMAL HIGH (ref 80.0–100.0)
MCV: 105.6 fL — ABNORMAL HIGH (ref 80.0–100.0)
Platelets: 105 K/uL — ABNORMAL LOW (ref 150–400)
Platelets: 93 K/uL — ABNORMAL LOW (ref 150–400)
RBC: 2.99 MIL/uL — ABNORMAL LOW (ref 4.22–5.81)
RBC: 3.04 MIL/uL — ABNORMAL LOW (ref 4.22–5.81)
RDW: 15.1 % (ref 11.5–15.5)
RDW: 15.1 % (ref 11.5–15.5)
WBC: 7.7 K/uL (ref 4.0–10.5)
WBC: 9 K/uL (ref 4.0–10.5)
nRBC: 0 % (ref 0.0–0.2)
nRBC: 0 % (ref 0.0–0.2)

## 2024-08-03 LAB — BASIC METABOLIC PANEL WITH GFR
Anion gap: 17 — ABNORMAL HIGH (ref 5–15)
BUN: 57 mg/dL — ABNORMAL HIGH (ref 6–20)
CO2: 22 mmol/L (ref 22–32)
Calcium: 9.9 mg/dL (ref 8.9–10.3)
Chloride: 95 mmol/L — ABNORMAL LOW (ref 98–111)
Creatinine, Ser: 7.2 mg/dL — ABNORMAL HIGH (ref 0.61–1.24)
GFR, Estimated: 9 mL/min — ABNORMAL LOW (ref >60)
Glucose, Bld: 150 mg/dL — ABNORMAL HIGH (ref 70–99)
Potassium: 3.1 mmol/L — ABNORMAL LOW (ref 3.5–5.1)
Sodium: 134 mmol/L — ABNORMAL LOW (ref 135–145)

## 2024-08-03 LAB — URINALYSIS, ROUTINE W REFLEX MICROSCOPIC
Bilirubin Urine: NEGATIVE
Glucose, UA: NEGATIVE mg/dL
Ketones, ur: NEGATIVE mg/dL
Nitrite: NEGATIVE
Protein, ur: 100 mg/dL — AB
Specific Gravity, Urine: 1.009 (ref 1.005–1.030)
pH: 5 (ref 5.0–8.0)

## 2024-08-03 LAB — HEPATIC FUNCTION PANEL
ALT: 54 U/L — ABNORMAL HIGH (ref 0–44)
AST: 76 U/L — ABNORMAL HIGH (ref 15–41)
Albumin: 3 g/dL — ABNORMAL LOW (ref 3.5–5.0)
Alkaline Phosphatase: 203 U/L — ABNORMAL HIGH (ref 38–126)
Bilirubin, Direct: 1 mg/dL — ABNORMAL HIGH (ref 0.0–0.2)
Indirect Bilirubin: 1.2 mg/dL — ABNORMAL HIGH (ref 0.3–0.9)
Total Bilirubin: 2.2 mg/dL — ABNORMAL HIGH (ref 0.0–1.2)
Total Protein: 6.8 g/dL (ref 6.5–8.1)

## 2024-08-03 LAB — RAPID URINE DRUG SCREEN, HOSP PERFORMED
Amphetamines: NOT DETECTED
Barbiturates: NOT DETECTED
Benzodiazepines: NOT DETECTED
Cocaine: NOT DETECTED
Opiates: NOT DETECTED
Tetrahydrocannabinol: NOT DETECTED

## 2024-08-03 LAB — PROTIME-INR
INR: 1.5 — ABNORMAL HIGH (ref 0.8–1.2)
INR: 1.5 — ABNORMAL HIGH (ref 0.8–1.2)
Prothrombin Time: 18.5 s — ABNORMAL HIGH (ref 11.4–15.2)
Prothrombin Time: 19.1 s — ABNORMAL HIGH (ref 11.4–15.2)

## 2024-08-03 LAB — ETHANOL: Alcohol, Ethyl (B): <15 mg/dL (ref <15)

## 2024-08-03 LAB — AMMONIA
Ammonia: 150 umol/L — ABNORMAL HIGH (ref 9–35)
Ammonia: 151 umol/L — ABNORMAL HIGH (ref 9–35)

## 2024-08-03 LAB — MAGNESIUM: Magnesium: 2.3 mg/dL (ref 1.7–2.4)

## 2024-08-03 LAB — HIV ANTIBODY (ROUTINE TESTING W REFLEX): HIV Screen 4th Generation wRfx: NONREACTIVE

## 2024-08-03 LAB — PHOSPHORUS: Phosphorus: 6.8 mg/dL — ABNORMAL HIGH (ref 2.5–4.6)

## 2024-08-03 LAB — HEPATITIS B SURFACE ANTIGEN: Hepatitis B Surface Ag: NONREACTIVE

## 2024-08-03 MED ORDER — SODIUM CHLORIDE 0.9 % IV SOLN
2.0000 g | INTRAVENOUS | Status: DC
  Administered 2024-08-04 – 2024-08-05 (×2): 2 g via INTRAVENOUS
  Filled 2024-08-03 (×2): qty 20

## 2024-08-03 MED ORDER — POTASSIUM CHLORIDE 10 MEQ/100ML IV SOLN
10.0000 meq | Freq: Once | INTRAVENOUS | Status: AC
  Administered 2024-08-03: 10 meq via INTRAVENOUS
  Filled 2024-08-03: qty 100

## 2024-08-03 MED ORDER — APIXABAN 5 MG PO TABS
5.0000 mg | ORAL_TABLET | Freq: Two times a day (BID) | ORAL | Status: DC
  Administered 2024-08-03 – 2024-08-06 (×7): 5 mg via ORAL
  Filled 2024-08-03 (×7): qty 1

## 2024-08-03 MED ORDER — ACETAMINOPHEN 500 MG PO TABS
500.0000 mg | ORAL_TABLET | Freq: Four times a day (QID) | ORAL | Status: DC | PRN
  Administered 2024-08-03 – 2024-08-04 (×2): 500 mg via ORAL
  Filled 2024-08-03 (×2): qty 1

## 2024-08-03 MED ORDER — POTASSIUM CHLORIDE CRYS ER 20 MEQ PO TBCR: 20.0000 meq | EXTENDED_RELEASE_TABLET | Freq: Once | ORAL | Status: DC

## 2024-08-03 MED ORDER — SUCRALFATE 1 G PO TABS: 1.0000 g | ORAL_TABLET | Freq: Every evening | ORAL | Status: DC | PRN

## 2024-08-03 MED ORDER — TORSEMIDE 20 MG PO TABS
10.0000 mg | ORAL_TABLET | Freq: Every day | ORAL | Status: DC
  Administered 2024-08-03 – 2024-08-06 (×3): 10 mg via ORAL
  Filled 2024-08-03 (×4): qty 1

## 2024-08-03 MED ORDER — RIFAXIMIN 550 MG PO TABS
550.0000 mg | ORAL_TABLET | Freq: Two times a day (BID) | ORAL | Status: DC
  Administered 2024-08-03 – 2024-08-06 (×7): 550 mg via ORAL
  Filled 2024-08-03 (×8): qty 1

## 2024-08-03 MED ORDER — AMITRIPTYLINE HCL 50 MG PO TABS
25.0000 mg | ORAL_TABLET | Freq: Every day | ORAL | Status: DC
Start: 2024-08-03 — End: 2024-08-06
  Administered 2024-08-04 – 2024-08-05 (×3): 25 mg via ORAL
  Filled 2024-08-03 (×3): qty 1

## 2024-08-03 MED ORDER — RENA-VITE PO TABS
1.0000 | ORAL_TABLET | Freq: Every day | ORAL | Status: DC
  Administered 2024-08-04 – 2024-08-05 (×3): 1 via ORAL
  Filled 2024-08-03 (×3): qty 1

## 2024-08-03 MED ORDER — LACTULOSE 10 GM/15ML PO SOLN
30.0000 g | Freq: Four times a day (QID) | ORAL | Status: DC
  Administered 2024-08-03 – 2024-08-06 (×12): 30 g via ORAL
  Filled 2024-08-03 (×12): qty 45

## 2024-08-03 MED ORDER — HALOPERIDOL LACTATE 5 MG/ML IJ SOLN
1.0000 mg | Freq: Four times a day (QID) | INTRAMUSCULAR | Status: DC | PRN
  Administered 2024-08-03: 1 mg via INTRAVENOUS
  Filled 2024-08-03 (×2): qty 1

## 2024-08-03 MED ORDER — LACTULOSE ENEMA
300.0000 mL | ORAL | Status: DC
  Filled 2024-08-03: qty 300

## 2024-08-03 MED ORDER — LANTHANUM CARBONATE 500 MG PO CHEW
1000.0000 mg | CHEWABLE_TABLET | Freq: Three times a day (TID) | ORAL | Status: DC
  Administered 2024-08-03 – 2024-08-05 (×5): 1000 mg via ORAL
  Filled 2024-08-03 (×11): qty 2

## 2024-08-03 MED ORDER — PANTOPRAZOLE SODIUM 40 MG PO TBEC
40.0000 mg | DELAYED_RELEASE_TABLET | Freq: Two times a day (BID) | ORAL | Status: DC
  Administered 2024-08-03 – 2024-08-06 (×7): 40 mg via ORAL
  Filled 2024-08-03 (×7): qty 1

## 2024-08-03 MED ORDER — HEPARIN SODIUM (PORCINE) 1000 UNIT/ML IJ SOLN
INTRAMUSCULAR | Status: AC
  Filled 2024-08-03: qty 1

## 2024-08-03 MED ORDER — ALLOPURINOL 100 MG PO TABS
100.0000 mg | ORAL_TABLET | Freq: Every day | ORAL | Status: DC
  Administered 2024-08-03 – 2024-08-06 (×4): 100 mg via ORAL
  Filled 2024-08-03 (×4): qty 1

## 2024-08-03 MED ORDER — POTASSIUM CHLORIDE 10 MEQ/100ML IV SOLN
10.0000 meq | INTRAVENOUS | Status: AC
  Administered 2024-08-03: 10 meq via INTRAVENOUS
  Filled 2024-08-03: qty 100

## 2024-08-03 MED ORDER — DIALYVITE 3000 3 MG PO TABS: 1.0000 | ORAL_TABLET | Freq: Every day | ORAL | Status: DC

## 2024-08-03 MED ORDER — MONTELUKAST SODIUM 10 MG PO TABS
10.0000 mg | ORAL_TABLET | Freq: Every day | ORAL | Status: DC
  Administered 2024-08-03 – 2024-08-06 (×4): 10 mg via ORAL
  Filled 2024-08-03 (×4): qty 1

## 2024-08-03 MED ORDER — NITROGLYCERIN 0.4 MG SL SUBL: 0.4000 mg | SUBLINGUAL_TABLET | SUBLINGUAL | Status: DC | PRN

## 2024-08-03 MED ORDER — VITAMIN B-12 5000 MCG SL SUBL: 5000.0000 ug | SUBLINGUAL_TABLET | Freq: Every day | SUBLINGUAL | Status: DC

## 2024-08-03 MED ORDER — ONDANSETRON HCL 4 MG/2ML IJ SOLN
4.0000 mg | Freq: Four times a day (QID) | INTRAMUSCULAR | Status: DC | PRN
  Administered 2024-08-03: 4 mg via INTRAVENOUS
  Filled 2024-08-03 (×2): qty 2

## 2024-08-03 MED ORDER — HEPARIN SODIUM (PORCINE) 1000 UNIT/ML IJ SOLN
INTRAMUSCULAR | Status: AC
Start: 2024-08-03 — End: 2024-08-03
  Filled 2024-08-03: qty 4

## 2024-08-03 MED ORDER — LOSARTAN POTASSIUM 50 MG PO TABS
50.0000 mg | ORAL_TABLET | Freq: Every day | ORAL | Status: DC
  Administered 2024-08-03 – 2024-08-06 (×3): 50 mg via ORAL
  Filled 2024-08-03 (×4): qty 1

## 2024-08-03 MED ORDER — DILTIAZEM HCL ER 60 MG PO CP12
120.0000 mg | ORAL_CAPSULE | Freq: Two times a day (BID) | ORAL | Status: DC
  Administered 2024-08-03 – 2024-08-04 (×3): 120 mg via ORAL
  Filled 2024-08-03 (×9): qty 2

## 2024-08-03 MED ORDER — POTASSIUM CHLORIDE CRYS ER 20 MEQ PO TBCR
20.0000 meq | EXTENDED_RELEASE_TABLET | Freq: Once | ORAL | Status: AC
  Administered 2024-08-03: 20 meq via ORAL
  Filled 2024-08-03: qty 1

## 2024-08-03 MED ORDER — METOPROLOL SUCCINATE ER 25 MG PO TB24
25.0000 mg | ORAL_TABLET | Freq: Every day | ORAL | Status: DC
  Administered 2024-08-03 – 2024-08-04 (×2): 25 mg via ORAL
  Filled 2024-08-03 (×4): qty 1

## 2024-08-03 MED ORDER — LACTULOSE 10 GM/15ML PO SOLN
20.0000 g | Freq: Once | ORAL | Status: AC
  Administered 2024-08-03: 20 g via ORAL
  Filled 2024-08-03: qty 30

## 2024-08-03 MED ORDER — SODIUM CHLORIDE 0.9 % IV SOLN
1.0000 g | Freq: Once | INTRAVENOUS | Status: AC
  Administered 2024-08-03: 1 g via INTRAVENOUS
  Filled 2024-08-03: qty 10

## 2024-08-03 NOTE — Consult Note (Signed)
 Consultation Note   Referring Provider:  [facility] PCP: Jesus Bernardino MATSU, MD Primary Gastroenterologist: Rosario JAYSON Kidney, MD Reason for Consultation:  Hepatic encephalopathy DOA: 08/02/2024         Hospital Day: 2   ASSESSMENT     53 y.o. 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  Admitted with encephalopathy and ammonia level of 150.  Encephalopathy may be secondary to both UTI and hepatic encephalopathy. He may have advanced to cirrhosis since liver biopsy in 2024 which suggested F3 fibrosis.  If he does have cirrhosis, UTI certainly could have led to his decompensation. Of note,  an ultrasound 2 weeks ago did not show a cirrhotic appearing liver.  No evidence for portal hypertension on last EGD but that was back in 2023  UTI Significant pyuria Being treated with Rocephin  pending culture  GERD without Barretts Per home med list he takes twice daily PPI at home  ESRD on HD TTS  Atrial fibrillation, on Eliquis   CAD status post CABG 2017  Morbid obesity  Stable GI problems: Chronic constipation History of colon polyps History of gastric intestinal metaplasia  See PMH for additional medical problems  Principal Problem:   Acute hepatic encephalopathy (HCC) Active Problems:   Hypertension   Coronary artery disease involving native coronary artery of native heart without angina pectoris   Morbid obesity with BMI of 50.0-59.9, adult (HCC)   Gout   ESRD (end stage renal disease) (HCC)   Atrial fibrillation (HCC)   Chronic diastolic CHF (congestive heart failure) (HCC)   Decompensated cirrhosis (HCC)   UTI (urinary tract infection)     PLAN:   --Patient is somnolent on exam . I discussed with Nephrology who does not feel that the encephalopathy is due to uremia as patient is compliant with dialysis.  Plan is for dialysis later today.  In the interim will give him 1 lactulose  enema.   So far he does seem to be tolerating meds with sips and tolerating lactulose  so will continue that as long as he is in an upright position ( discussed with RN).  Depending on clinical course , will give additional lactulose  enemas after dialysis --Continue twice daily rifaximin  --Continue twice daily PPI for history of GERD --When encephalopathy improves will send for ultrasound to evaluate for any ascites and if positive >> diagnostic tap.   HPI   Brief history  Patient is a 53 year old male with multiple medical problems.  He is followed by us  for history of GERD and was last seen in July of this year for GERD symptoms. He also has MASH and is followed by [facility] ( last seen on 06/21/2024).   Interval history Patient brought to ED yesterday for altered mental status, decreased p.o. intake since dialysis on Monday per aunt who is in the room and provides history.    Patient is extremely lethargic, unable to provide any history. He got Haldol  around 5 am.  Mother was in room earlier but currently out of the room.  His Aunt is here.   Workup: Sodium 134 Liver chemistries are mildly elevated but stable compared to recent labs.  WBC 9.  Hemoglobin stable at 10.8.  Platelets 105.SABRA  Ammonia level 150.  Alcohol level negative, UDS negative.  Head CT without acute abnormalities  UA-moderate leukocytes and many bacteria   Pertinent GI Studies   *Most recent, not complete list of studies  December 2023 EGD  Showed gastric metaplasia without dysplasia - Normal esophagus. - Three gastric polyps. Some spontaneous oozing. Biopsied. - Abnormal gastric mucosa with both erythema and loss of vascular pattern. Biopsied. - No evidence for portal hypertensive gastropathy, esophageal varices, or gastric varices. - Erythematous duodenopathy. Biopsied.  02/19/21 colonoscopy with Dr. Eda, recall 2028 Impression: - One 3 mm polyp in the transverse colon, removed with a cold snare. Resected and  retrieved. - The examination was otherwise normal on direct and retroflexion views. Path:  F. COLON, TRANSVERSE, POLYPECTOMY: -  Tubular adenoma (1 of 1 fragments) -  No high-grade dysplasia or malignancy identified   Labs and Imaging:  Recent Labs    08/02/24 2340 08/03/24 0623  PROT 7.1 6.8  ALBUMIN  3.1* 3.0*  AST 75* 76*  ALT 56* 54*  ALKPHOS 217* 203*  BILITOT 2.2* 2.2*  BILIDIR  --  1.0*  IBILI  --  1.2*   Recent Labs    08/02/24 2340 08/02/24 2351 08/03/24 0623  WBC 9.0  --  7.7  HGB 10.7* 11.2* 10.8*  HCT 32.1* 33.0* 31.4*  MCV 105.6*  --  105.0*  PLT 105*  --  93*   Recent Labs    08/02/24 2340 08/02/24 2351 08/03/24 0623  NA 134* 135 134*  K 2.9* 2.9* 3.1*  CL 96* 98 95*  CO2 22  --  22  GLUCOSE 154* 151* 150*  BUN 53* 48* 57*  CREATININE 6.94* 6.70* 7.20*  CALCIUM  10.2  --  9.9     CT Head Wo Contrast EXAM: CT HEAD WITHOUT CONTRAST 08/03/2024 12:54:53 AM  TECHNIQUE: CT of the head was performed without the administration of intravenous contrast. Automated exposure control, iterative reconstruction, and/or weight based adjustment of the mA/kV was utilized to reduce the radiation dose to as low as reasonably achievable.  COMPARISON: None available.  CLINICAL HISTORY: Mental status change, unknown cause. AMS.  FINDINGS:  BRAIN AND VENTRICLES: No acute hemorrhage. No evidence of acute infarct. No hydrocephalus. No extra-axial collection. No mass effect or midline shift.  ORBITS: No acute abnormality.  SINUSES: No acute abnormality.  SOFT TISSUES AND SKULL: No acute soft tissue abnormality. No skull fracture.  IMPRESSION: 1. No acute intracranial abnormality.  Electronically signed by: Franky Stanford MD 08/03/2024 12:58 AM EDT RP Workstation: [id number]    Past Medical History:  Diagnosis Date   Abnormal liver enzymes    a. Sees a doctor in [place].   Acute renal failure superimposed on stage 5 chronic kidney disease, not  on chronic dialysis (HCC) 01/08/2024   AKI (acute kidney injury) (HCC) 06/17/2023   Atrial fibrillation (HCC)    Cardiac cirrhosis    a. possible elevated LFTs/low platelets felt due to cardiac cirrhosis per 2017 admission.   Chronic combined systolic and diastolic CHF (congestive heart failure) (HCC)    Chronic kidney disease, stage 4 (severe) (HCC) 01/04/2016   Lab Results      Component    Value    Date/Time           GFR    29.24 (L)    09/30/2022 02:51 PM           GFR    27.27 (L)    09/24/2022 03:40 PM  GFR    24.10 (L)    09/03/2022 03:45 PM           GFR    18.98 (L)    08/25/2022 12:21 PM           GFR    38.98 (L)    04/24/2022 02:39 PM   Seeing [facility]: Jerrye Katheryn BROCKS, MD  Lastt plan 04/2023: # CKD stage IV    CKD (chronic kidney disease) stage 3, GFR 30-59 ml/min (HCC) 01/04/2016   Lab Results  Component  Value  Date/Time     GFR  29.24 (L)  09/30/2022 02:51 PM     GFR  27.27 (L)  09/24/2022 03:40 PM     GFR  24.10 (L)  09/03/2022 03:45 PM     GFR  18.98 (L)  08/25/2022 12:21 PM     GFR  38.98 (L)  04/24/2022 02:39 PM         CKD (chronic kidney disease), stage II    Stage 4   Congenital heart defect    a. rightward rotation of heart, almost dextrocardia   Coronary artery disease    a. s/p CABGx1 in 12/2015.   Dyspnea    with exertion   Gastric polyps 11/25/2022   Gastritis and gastroduodenitis 11/25/2022          Gastroesophageal reflux disease with esophagitis and hemorrhage 04/04/2021   Hematuria    a. Chronic hx of this, no prior etiology determined through workup per patient.   History of umbilical hernia repair 07/10/2011   History of hernia surgery twice prior   Hypercholesteremia    a. Prev taken off statin due to abnormal liver function.   Hypertension    Incisional hernia 07/10/2011   History of hernia surgery twice prior   Morbid obesity (HCC)    Morbid obesity with BMI of 50.0-59.9, adult (HCC) 01/04/2016   After [place]  GI doctor is setting him up with a wellness clinic to help with weight losS  He reports not having tried anything for weight loss either diet or or medicine or surgery in the past  We failed to get Wegovy  10/2022      Wt Readings from Last 10 Encounters:  11/13/22  (!) 384 lb 12.8 oz (174.5 kg)  10/13/22  (!) 385 lb (174.6 kg)  09/30/22  (!) 384 lb (174.2 kg)  09/24/22  (!) 382 lb (1   Nausea and vomiting 11/25/2022   OSA on CPAP    Paroxysmal atrial flutter (HCC) 02/12/2016   PONV (postoperative nausea and vomiting)    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   LIMA to LAD   Sinus bradycardia    Thrombocytopenia (HCC)     Past Surgical History:  Procedure Laterality Date   APPENDECTOMY     AV FISTULA PLACEMENT Left 01/11/2024   Procedure: LEFT RADIOCEPHALIC ARTERIOVENOUS (AV) FISTULA CREATION;  Surgeon: Lanis Fonda BRAVO, MD;  Location: [facility] OR;  Service: Vascular;  Laterality: Left;   BIOPSY  02/19/2021   Procedure: BIOPSY;  Surgeon: Eda Iha, MD;  Location: [facility] ENDOSCOPY;  Service: Gastroenterology;;   BIOPSY  11/25/2022   Procedure: BIOPSY;  Surgeon: Eda Iha, MD;  Location: [facility] ENDOSCOPY;  Service: Gastroenterology;;   CARDIAC CATHETERIZATION N/A 12/24/2015   Procedure: Right/Left Heart Cath and Coronary Angiography;  Surgeon: Toribio JONELLE Fuel, MD;  Location: [facility] INVASIVE CV LAB;  Service: Cardiovascular;  Laterality: N/A;   CARDIOVERSION N/A 02/14/2016   Procedure: CARDIOVERSION;  Surgeon: Vinie JAYSON Maxcy, MD;  Location: [facility] ENDOSCOPY;  Service: Cardiovascular;  Laterality: N/A;   COLONOSCOPY WITH PROPOFOL  N/A 02/19/2021   Procedure: COLONOSCOPY WITH PROPOFOL ;  Surgeon: Eda Iha, MD;  Location: [facility] ENDOSCOPY;  Service: Gastroenterology;  Laterality: N/A;   CORONARY ARTERY BYPASS GRAFT N/A 12/26/2015   Procedure: Off Pump Coronary Artery Bypass Grafting times one using left internal mammary artery;  Surgeon: Sudie VEAR Laine, MD;  Location: [facility] OR;  Service: Open Heart Surgery;   Laterality: N/A;   ESOPHAGOGASTRODUODENOSCOPY (EGD) WITH PROPOFOL  N/A 02/19/2021   Procedure: ESOPHAGOGASTRODUODENOSCOPY (EGD) WITH PROPOFOL ;  Surgeon: Eda Iha, MD;  Location: [facility] ENDOSCOPY;  Service: Gastroenterology;  Laterality: N/A;   ESOPHAGOGASTRODUODENOSCOPY (EGD) WITH PROPOFOL  N/A 11/25/2022   Procedure: ESOPHAGOGASTRODUODENOSCOPY (EGD) WITH PROPOFOL ;  Surgeon: Eda Iha, MD;  Location: [facility] ENDOSCOPY;  Service: Gastroenterology;  Laterality: N/A;   FISTULA SUPERFICIALIZATION Left 05/06/2024   Procedure: FISTULA SUPERFICIALIZATION;  Surgeon: Sheree Penne Bruckner, MD;  Location: [facility] OR;  Service: Vascular;  Laterality: Left;   GALLBLADDER SURGERY     HERNIA REPAIR     INSERTION OF DIALYSIS CATHETER Right 01/11/2024   Procedure: INSERTION OF TUNNELED  DIALYSIS CATHETER RIGHT INTERNAL JUGULAR;  Surgeon: Lanis Fonda BRAVO, MD;  Location: [facility] OR;  Service: Vascular;  Laterality: Right;   IR TRANSCATHETER BX  06/26/2023   IR US  GUIDE VASC ACCESS RIGHT  06/26/2023   IR VENOGRAM HEPATIC W HEMODYNAMIC EVALUATION  06/26/2023   LEFT HEART CATH AND CORS/GRAFTS ANGIOGRAPHY N/A 04/15/2017   Procedure: Left Heart Cath and Cors/Grafts Angiography;  Surgeon: Burnard Debby LABOR, MD;  Location: [facility] INVASIVE CV LAB;  Service: Cardiovascular;  Laterality: N/A;   LEFT HEART CATH AND CORS/GRAFTS ANGIOGRAPHY N/A 11/02/2020   Procedure: LEFT HEART CATH AND CORS/GRAFTS ANGIOGRAPHY;  Surgeon: Wonda Sharper, MD;  Location: [facility] INVASIVE CV LAB;  Service: Cardiovascular;  Laterality: N/A;   LIGATION OF COMPETING BRANCHES OF ARTERIOVENOUS FISTULA Left 05/06/2024   Procedure: LIGATION OF COMPETING BRANCHES OF ARTERIOVENOUS FISTULA;  Surgeon: Sheree Penne Bruckner, MD;  Location: [facility] OR;  Service: Vascular;  Laterality: Left;   POLYPECTOMY  02/19/2021   Procedure: POLYPECTOMY;  Surgeon: Eda Iha, MD;  Location: [facility] ENDOSCOPY;  Service: Gastroenterology;;   TEE WITHOUT CARDIOVERSION N/A 12/26/2015    Procedure: TRANSESOPHAGEAL ECHOCARDIOGRAM (TEE);  Surgeon: Sudie VEAR Laine, MD;  Location: [facility] OR;  Service: Open Heart Surgery;  Laterality: N/A;   TEE WITHOUT CARDIOVERSION N/A 02/14/2016   Procedure: TRANSESOPHAGEAL ECHOCARDIOGRAM (TEE);  Surgeon: Vinie JAYSON Maxcy, MD;  Location: [facility] ENDOSCOPY;  Service: Cardiovascular;  Laterality: N/A;    Family History  Problem Relation Age of Onset   CAD Father        4 stents ~ 54   Diabetes Father    Diabetes Maternal Grandfather    Diabetes Maternal Uncle    Heart attack Other    Hyperlipidemia Other     Prior to Admission medications   Medication Sig Start Date End Date Taking? Authorizing Provider  allopurinol  (ZYLOPRIM ) 100 MG tablet TAKE 1 TABLET BY MOUTH EVERY DAY 05/18/24  Yes Jesus Bernardino MATSU, MD  amitriptyline  (ELAVIL ) 25 MG tablet TAKE 1 TABLET BY MOUTH EVERYDAY AT BEDTIME 08/02/24  Yes May, Deanna J, NP  B Complex-C-Folic Acid  (DIALYVITE  TABLET) TABS Take 1 tablet by mouth daily. 05/02/24  Yes [provider]  Cyanocobalamin  (VITAMIN B-12) 5000 MCG SUBL Place 5,000 mcg under the tongue daily.   Yes [provider]  diltiazem  (CARDIZEM  SR) 120 MG 12 hr  capsule TAKE 1 CAPSULE BY MOUTH TWICE A DAY 07/09/24  Yes Jesus Bernardino MATSU, MD  ELIQUIS  5 MG TABS tablet TAKE 1 TABLET(5 MG) BY MOUTH TWICE DAILY 01/26/24  Yes Chandrasekhar, Mahesh A, MD  Evolocumab  (REPATHA ) 140 MG/ML SOSY Inject 140 mg into the skin every 14 (fourteen) days.   Yes [provider]  folic acid -vitamin b complex-vitamin c-selenium-zinc (DIALYVITE ) 3 MG TABS tablet Take 1 tablet by mouth daily.   Yes [provider]  lanthanum  (FOSRENOL ) 1000 MG chewable tablet Chew 1,000 mg by mouth 3 (three) times daily with meals. 04/19/24 04/19/25 Yes [provider]  montelukast  (SINGULAIR ) 10 MG tablet TAKE 1 TABLET BY MOUTH EVERY DAY 05/18/24  Yes Jesus Bernardino MATSU, MD  sucralfate  (CARAFATE ) 1 g tablet Take 1 tablet (1 g total) by mouth at bedtime as  needed (Stomach pain). 07/21/24  Yes Jesus Bernardino MATSU, MD  azelastine  (ASTELIN ) 0.1 % nasal spray Place 2 sprays into both nostrils 2 (two) times daily. Use in each nostril as directed 06/16/24   Tobie Eldora NOVAK, MD  cephALEXin  (KEFLEX ) 500 MG capsule Take 1 capsule (500 mg total) by mouth 2 (two) times daily. 07/21/24   Jesus Bernardino MATSU, MD  iron sucrose in sodium chloride  0.9 % 100 mL Iron Sucrose (Venofer) 02/20/24 02/18/25  [provider]  ketoconazole  (NIZORAL ) 2 % cream APPLY TO AFFECTED AREA TWICE A DAY 05/18/24   Jesus Bernardino MATSU, MD  losartan  (COZAAR ) 50 MG tablet Take 50 mg by mouth daily. 05/20/24   [provider]  Methoxy PEG-Epoetin Beta (MIRCERA IJ) 75 mcg. 04/02/24 04/01/25  [provider]  metoprolol  succinate (TOPROL -XL) 25 MG 24 hr tablet Take 25 mg by mouth daily. 07/07/24   [provider]  nitroGLYCERIN  (NITROSTAT ) 0.4 MG SL tablet Place 1 tablet (0.4 mg total) under the tongue every 5 (five) minutes as needed for chest pain. 02/10/22   Chandrasekhar, Stanly LABOR, MD  ondansetron  (ZOFRAN -ODT) 4 MG disintegrating tablet Take 1 tablet (4 mg total) by mouth every 8 (eight) hours as needed for nausea or vomiting. 12/16/23   Jesus Bernardino MATSU, MD  oxyCODONE -acetaminophen  (PERCOCET) 5-325 MG tablet Take 1 tablet by mouth every 6 (six) hours as needed for severe pain (pain score 7-10). 05/06/24   Rhyne, Lucie PARAS, PA-C  pantoprazole  (PROTONIX ) 40 MG tablet Take 40 mg by mouth 2 (two) times daily. 06/28/24   [provider]  promethazine  (PHENERGAN ) 25 MG tablet Take 1 tablet (25 mg total) by mouth every 8 (eight) hours as needed for nausea or vomiting. 07/21/24   Jesus Bernardino MATSU, MD  sulfamethoxazole -trimethoprim  (BACTRIM  DS) 800-160 MG tablet Take 1 tablet by mouth daily for 10 days. 07/05/24 07/15/24  Jesus Bernardino MATSU, MD  tirzepatide  (MOUNJARO ) 2.5 MG/0.5ML Pen Inject 2.5 mg into the skin once a week. Patient not taking: Reported on 07/14/2024 06/18/24    Jesus Bernardino MATSU, MD  torsemide  (DEMADEX ) 10 MG tablet Take 10 mg by mouth daily.    [provider]    Current Facility-Administered Medications  Medication Dose Route Frequency Provider Last Rate Last Admin   acetaminophen  (TYLENOL ) tablet 500 mg  500 mg Oral Q6H PRN Agbata, Tochukwu, MD   500 mg at 08/03/24 0951   allopurinol  (ZYLOPRIM ) tablet 100 mg  100 mg Oral Daily Agbata, Tochukwu, MD       amitriptyline  (ELAVIL ) tablet 25 mg  25 mg Oral QHS Agbata, Tochukwu, MD       apixaban  (ELIQUIS ) tablet 5  mg  5 mg Oral BID Agbata, Tochukwu, MD       [START ON 08/04/2024] cefTRIAXone  (ROCEPHIN ) 2 g in sodium chloride  0.9 % 100 mL IVPB  2 g Intravenous Q24H Agbata, Tochukwu, MD       diltiazem  (CARDIZEM  SR) 12 hr capsule 120 mg  120 mg Oral BID Agbata, Tochukwu, MD       haloperidol  lactate (HALDOL ) injection 1 mg  1 mg Intravenous Q6H PRN Keturah Carrier, MD   1 mg at 08/03/24 0452   lactulose  (CHRONULAC ) 10 GM/15ML solution 30 g  30 g Oral QID Segars, Jonathan, MD   30 g at 08/03/24 9048   lanthanum  (FOSRENOL ) chewable tablet 1,000 mg  1,000 mg Oral TID WC Agbata, Tochukwu, MD       losartan  (COZAAR ) tablet 50 mg  50 mg Oral Daily Agbata, Tochukwu, MD       metoprolol  succinate (TOPROL -XL) 24 hr tablet 25 mg  25 mg Oral Daily Agbata, Tochukwu, MD       montelukast  (SINGULAIR ) tablet 10 mg  10 mg Oral Daily Agbata, Tochukwu, MD       multivitamin (RENA-VIT) tablet 1 tablet  1 tablet Oral QHS Gretel Prentice BIRCH, RPH       nitroGLYCERIN  (NITROSTAT ) SL tablet 0.4 mg  0.4 mg Sublingual Q5 min PRN Agbata, Tochukwu, MD       ondansetron  (ZOFRAN ) injection 4 mg  4 mg Intravenous Q6H PRN Keturah Carrier, MD   4 mg at 08/03/24 0451   pantoprazole  (PROTONIX ) EC tablet 40 mg  40 mg Oral BID Agbata, Tochukwu, MD       rifaximin  (XIFAXAN ) tablet 550 mg  550 mg Oral BID Segars, Jonathan, MD   550 mg at 08/03/24 9482   sucralfate  (CARAFATE ) tablet 1 g  1 g Oral QHS PRN Agbata, Tochukwu, MD        torsemide  (DEMADEX ) tablet 10 mg  10 mg Oral Daily Agbata, Tochukwu, MD       Facility-Administered Medications Ordered in Other Encounters  Medication Dose Route Frequency Provider Last Rate Last Admin   technetium tetrofosmin  (TC-MYOVIEW ) injection 29.8 millicurie  29.8 millicurie Intravenous Once PRN Hilty, Vinie BROCKS, MD        Allergies as of 08/02/2024 - Review Complete 08/02/2024  Allergen Reaction Noted   Glucophage  [metformin ] Other (See Comments) 06/17/2023   Vibra -tab [doxycycline ] Shortness Of Breath 11/15/2021   Chlorhexidine  Itching 02/05/2024   Chlorhexidine  gluconate Itching and Other (See Comments) 01/11/2024   Augmentin [amoxicillin-pot clavulanate] Itching 04/07/2016   Imdur  [isosorbide  dinitrate] Diarrhea 04/16/2017   Tape Rash 04/14/2017   Valacyclovir  Itching 12/30/2022    Social History   Socioeconomic History   Marital status: Divorced    Spouse name: Not on file   Number of children: Not on file   Years of education: Not on file   Highest education level: Not on file  Occupational History   Occupation: [employer] doctor  Tobacco Use   Smoking status: Never   Smokeless tobacco: Never  Vaping Use   Vaping status: Never Used  Substance and Sexual Activity   Alcohol use: Not Currently    Comment: 6 beers per month   Drug use: No   Sexual activity: Not Currently  Other Topics Concern   Not on file  Social History Narrative   Truck driver -- regional   Lives with mom and dad   Social Drivers of Corporate investment banker Strain: Not on file  Food Insecurity: No  Food Insecurity (08/03/2024)   Hunger Vital Sign    Worried About Running Out of Food in the Last Year: Never true    Ran Out of Food in the Last Year: Never true  Transportation Needs: No Transportation Needs (08/03/2024)   PRAPARE - Administrator, Civil Service (Medical): No    Lack of Transportation (Non-Medical): No  Physical Activity: Not on file  Stress: Not on file  Social  Connections: Unknown (08/26/2022)   [facility]   Social Network    Social Network: Not on file  Intimate Partner Violence: Not At Risk (08/03/2024)   Humiliation, Afraid, Rape, and Kick questionnaire    Fear of Current or Ex-Partner: No    Emotionally Abused: No    Physically Abused: No    Sexually Abused: No     Code Status   Code Status: Full Code  Review of Systems: Unable to obtain secondary to altered mental status  Physical Exam: Vital signs in last 24 hours: Temp:  [97.8 F (36.6 C)-98.8 F (37.1 C)] 98.8 F (37.1 C) (09/03 0850) Pulse Rate:  [79-87] 79 (09/03 0850) Resp:  [16-25] 16 (09/03 0850) BP: (117-154)/(52-72) 130/57 (09/03 0850) SpO2:  [97 %-100 %] 97 % (09/03 0850) Weight:  [832 kg] 167 kg (09/02 2320) Last BM Date : 08/01/24 (per family)  General:  obese male in chair, somnolent Eyes: Pupils equal Nose: No deformity, discharge or lesions nose  Lungs:  Clear to auscultation.  Heart:  Regular rate, regular rhythm.  Abdomen:  Soft, obese, doesn't appear to be tender, active bowel sounds Rectal :  Deferred Ext: 1+ distal BLE edema.  Msk: Symmetrical without gross deformities.   Intake/Output from previous day: 09/02 0701 - 09/03 0700 In: 286.8 [P.O.:100; IV Piggyback:186.8] Out: -  Intake/Output this shift:  Total I/O In: 100 [IV Piggyback:100] Out: -    Vina Dasen, NP-C   08/03/2024, 10:53 AM

## 2024-08-03 NOTE — ED Notes (Signed)
Pt ambulated to bathroom steady gait.

## 2024-08-03 NOTE — ED Notes (Signed)
Blood cultures collected prior to starting antibiotics. 

## 2024-08-03 NOTE — Assessment & Plan Note (Signed)
 Patient presented for evaluation of altered mental status.  Has a known history of liver cirrhosis and elevated ammonia level of 150. Continue rifaximin  and lactulose  Patient currently requires a safety sitter due to difficulty reorienting him and increased risk for falls.

## 2024-08-03 NOTE — Assessment & Plan Note (Signed)
 Not acutely exacerbated Last known LVEF, 2D echocardiogram which was done 05/24 showed an LVEF of 50 to 55% Continue torsemide , metoprolol  and losartan 

## 2024-08-03 NOTE — Assessment & Plan Note (Signed)
 Continue metoprolol , lactulose  and rifaximin  Place patient empirically on antibiotic therapy for possible SBP

## 2024-08-03 NOTE — Consult Note (Signed)
 Renal Service Consult Note [facility] Lamar JONETTA Fret, MD  Patient: Sean [name] Date: 08/03/2024 Requesting Physician: Dr. Lanetta  Reason for Consult: ESRD patient with altered mental status HPI: The patient is a 53 y.o. year-old w/ PMH as below who presented last night to ED with family reporting altered mental status.  Duration 1 to 2 days.  In the ED blood pressure 150/60, HR 80, RR 18-25, temp 98.1, 99% O2 sat on room air.  K+ 2.9, BUN 50 and creatinine 6.9.  Albumin  3.1 and ammonia level of 150.  Hemoglobin 10.7, WBC 9K.  Drug screen negative.  CT head was negative.  Patient was admitted for acute hepatic encephalopathy with decompensated cirrhosis.  We are asked to see for ESRD.   Pt seen in hospital room.  Patient is somnolent not responding to questions.  His aunt is in the room and provided history.  Patient was doing well till after dialysis on Monday.  Since then he has been confused and very sleepy.  No seizure-like activity, no significant shortness of breath or leg swelling.   ROS - denies CP, no joint pain, no HA, no blurry vision, no rash, no diarrhea, no nausea/ vomiting   Past Medical History  Past Medical History:  Diagnosis Date   Abnormal liver enzymes    a. Sees a doctor in [place].   Acute renal failure superimposed on stage 5 chronic kidney disease, not on chronic dialysis (HCC) 01/08/2024   AKI (acute kidney injury) (HCC) 06/17/2023   Atrial fibrillation (HCC)    Cardiac cirrhosis    a. possible elevated LFTs/low platelets felt due to cardiac cirrhosis per 2017 admission.   Chronic combined systolic and diastolic CHF (congestive heart failure) (HCC)    Chronic kidney disease, stage 4 (severe) (HCC) 01/04/2016   Lab Results      Component    Value    Date/Time           GFR    29.24 (L)    09/30/2022 02:51 PM           GFR    27.27 (L)    09/24/2022 03:40 PM           GFR    24.10 (L)    09/03/2022 03:45 PM           GFR    18.98 (L)     08/25/2022 12:21 PM           GFR    38.98 (L)    04/24/2022 02:39 PM   [facility]ing [facility]: Jerrye Katheryn BROCKS, MD  Lastt plan 04/2023: # CKD stage IV    CKD (chronic kidney disease) stage 3, GFR 30-59 ml/min (HCC) 01/04/2016   Lab Results  Component  Value  Date/Time     GFR  29.24 (L)  09/30/2022 02:51 PM     GFR  27.27 (L)  09/24/2022 03:40 PM     GFR  24.10 (L)  09/03/2022 03:45 PM     GFR  18.98 (L)  08/25/2022 12:21 PM     GFR  38.98 (L)  04/24/2022 02:39 PM         CKD (chronic kidney disease), stage II    Stage 4   Congenital heart defect    a. rightward rotation of heart, almost dextrocardia   Coronary artery disease    a. s/p CABGx1 in 12/2015.   Dyspnea    with exertion   Gastric polyps  11/25/2022   Gastritis and gastroduodenitis 11/25/2022          Gastroesophageal reflux disease with esophagitis and hemorrhage 04/04/2021   Hematuria    a. Chronic hx of this, no prior etiology determined through workup per patient.   History of umbilical hernia repair 07/10/2011   History of hernia surgery twice prior   Hypercholesteremia    a. Prev taken off statin due to abnormal liver function.   Hypertension    Incisional hernia 07/10/2011   History of hernia surgery twice prior   Morbid obesity (HCC)    Morbid obesity with BMI of 50.0-59.9, adult (HCC) 01/04/2016   After [place] [facility] doctor is setting him up with a wellness clinic to help with weight losS  He reports not having tried anything for weight loss either diet or or medicine or surgery in the past  We failed to get Wegovy  10/2022      Wt Readings from Last 10 Encounters:  11/13/22  (!) 384 lb 12.8 oz (174.5 kg)  10/13/22  (!) 385 lb (174.6 kg)  09/30/22  (!) 384 lb (174.2 kg)  09/24/22  (!) 382 lb (1   Nausea and vomiting 11/25/2022   OSA on CPAP    Paroxysmal atrial flutter (HCC) 02/12/2016   PONV (postoperative nausea and vomiting)    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   LIMA to LAD   Sinus bradycardia     Thrombocytopenia (HCC)    Past Surgical History  Past Surgical History:  Procedure Laterality Date   APPENDECTOMY     AV FISTULA PLACEMENT Left 01/11/2024   Procedure: LEFT RADIOCEPHALIC ARTERIOVENOUS (AV) FISTULA CREATION;  Surgeon: Lanis Fonda BRAVO, MD;  Location: [facility] OR;  Service: Vascular;  Laterality: Left;   BIOPSY  02/19/2021   Procedure: BIOPSY;  Surgeon: Eda Iha, MD;  Location: [facility] ENDOSCOPY;  Service: Gastroenterology;;   BIOPSY  11/25/2022   Procedure: BIOPSY;  Surgeon: Eda Iha, MD;  Location: [facility] ENDOSCOPY;  Service: Gastroenterology;;   CARDIAC CATHETERIZATION N/A 12/24/2015   Procedure: Right/Left Heart Cath and Coronary Angiography;  Surgeon: Toribio JONELLE Fuel, MD;  Location: [facility] INVASIVE CV LAB;  Service: Cardiovascular;  Laterality: N/A;   CARDIOVERSION N/A 02/14/2016   Procedure: CARDIOVERSION;  Surgeon: Vinie JAYSON Maxcy, MD;  Location: [facility] ENDOSCOPY;  Service: Cardiovascular;  Laterality: N/A;   COLONOSCOPY WITH PROPOFOL  N/A 02/19/2021   Procedure: COLONOSCOPY WITH PROPOFOL ;  Surgeon: Eda Iha, MD;  Location: [facility] ENDOSCOPY;  Service: Gastroenterology;  Laterality: N/A;   CORONARY ARTERY BYPASS GRAFT N/A 12/26/2015   Procedure: Off Pump Coronary Artery Bypass Grafting times one using left internal mammary artery;  Surgeon: Sudie VEAR Laine, MD;  Location: [facility] OR;  Service: Open Heart Surgery;  Laterality: N/A;   ESOPHAGOGASTRODUODENOSCOPY (EGD) WITH PROPOFOL  N/A 02/19/2021   Procedure: ESOPHAGOGASTRODUODENOSCOPY (EGD) WITH PROPOFOL ;  Surgeon: Eda Iha, MD;  Location: [facility] ENDOSCOPY;  Service: Gastroenterology;  Laterality: N/A;   ESOPHAGOGASTRODUODENOSCOPY (EGD) WITH PROPOFOL  N/A 11/25/2022   Procedure: ESOPHAGOGASTRODUODENOSCOPY (EGD) WITH PROPOFOL ;  Surgeon: Eda Iha, MD;  Location: [facility] ENDOSCOPY;  Service: Gastroenterology;  Laterality: N/A;   FISTULA SUPERFICIALIZATION Left 05/06/2024   Procedure: FISTULA SUPERFICIALIZATION;  Surgeon: Sheree Penne Bruckner, MD;  Location: [facility] OR;  Service: Vascular;  Laterality: Left;   GALLBLADDER SURGERY     HERNIA REPAIR     INSERTION OF DIALYSIS CATHETER Right 01/11/2024   Procedure: INSERTION OF TUNNELED  DIALYSIS CATHETER RIGHT INTERNAL JUGULAR;  Surgeon: Lanis Fonda BRAVO, MD;  Location: [facility] OR;  Service: Vascular;  Laterality: Right;   IR TRANSCATHETER BX  06/26/2023   IR US  GUIDE VASC ACCESS RIGHT  06/26/2023   IR VENOGRAM HEPATIC W HEMODYNAMIC EVALUATION  06/26/2023   LEFT HEART CATH AND CORS/GRAFTS ANGIOGRAPHY N/A 04/15/2017   Procedure: Left Heart Cath and Cors/Grafts Angiography;  Surgeon: Burnard Debby LABOR, MD;  Location: [facility] INVASIVE CV LAB;  Service: Cardiovascular;  Laterality: N/A;   LEFT HEART CATH AND CORS/GRAFTS ANGIOGRAPHY N/A 11/02/2020   Procedure: LEFT HEART CATH AND CORS/GRAFTS ANGIOGRAPHY;  Surgeon: Wonda Sharper, MD;  Location: [facility] INVASIVE CV LAB;  Service: Cardiovascular;  Laterality: N/A;   LIGATION OF COMPETING BRANCHES OF ARTERIOVENOUS FISTULA Left 05/06/2024   Procedure: LIGATION OF COMPETING BRANCHES OF ARTERIOVENOUS FISTULA;  Surgeon: Sheree Penne Bruckner, MD;  Location: [facility] OR;  Service: Vascular;  Laterality: Left;   POLYPECTOMY  02/19/2021   Procedure: POLYPECTOMY;  Surgeon: Eda Iha, MD;  Location: [facility] ENDOSCOPY;  Service: Gastroenterology;;   TEE WITHOUT CARDIOVERSION N/A 12/26/2015   Procedure: TRANSESOPHAGEAL ECHOCARDIOGRAM (TEE);  Surgeon: Sudie VEAR Laine, MD;  Location: [facility] OR;  Service: Open Heart Surgery;  Laterality: N/A;   TEE WITHOUT CARDIOVERSION N/A 02/14/2016   Procedure: TRANSESOPHAGEAL ECHOCARDIOGRAM (TEE);  Surgeon: Vinie JAYSON Maxcy, MD;  Location: [facility] ENDOSCOPY;  Service: Cardiovascular;  Laterality: N/A;   Family History  Family History  Problem Relation Age of Onset   CAD Father        4 stents ~ 54   Diabetes Father    Diabetes Maternal Grandfather    Diabetes Maternal Uncle    Heart attack Other    Hyperlipidemia Other    Social  History  reports that he has never smoked. He has never used smokeless tobacco. He reports that he does not currently use alcohol. He reports that he does not use drugs. Allergies  Allergies  Allergen Reactions   Glucophage  [Metformin ] Other (See Comments)    Pt told by his nephrologist not to take this medication due to his kidney function.   Vibra -Tab [Doxycycline ] Shortness Of Breath   Chlorhexidine  Itching   Chlorhexidine  Gluconate Itching and Other (See Comments)    burning   Augmentin [Amoxicillin-Pot Clavulanate] Itching   Imdur  [Isosorbide  Dinitrate] Diarrhea   Tape Rash   Valacyclovir  Itching   Home medications Prior to Admission medications   Medication Sig Start Date End Date Taking? Authorizing Provider  allopurinol  (ZYLOPRIM ) 100 MG tablet TAKE 1 TABLET BY MOUTH EVERY DAY 05/18/24  Yes Jesus Bernardino MATSU, MD  amitriptyline  (ELAVIL ) 25 MG tablet TAKE 1 TABLET BY MOUTH EVERYDAY AT BEDTIME 08/02/24  Yes May, Deanna J, NP  B Complex-C-Folic Acid  (DIALYVITE  TABLET) TABS Take 1 tablet by mouth daily. 05/02/24  Yes [provider]  Cyanocobalamin  (VITAMIN B-12) 5000 MCG SUBL Place 5,000 mcg under the tongue daily.   Yes [provider]  diltiazem  (CARDIZEM  SR) 120 MG 12 hr capsule TAKE 1 CAPSULE BY MOUTH TWICE A DAY 07/09/24  Yes Jesus Bernardino MATSU, MD  ELIQUIS  5 MG TABS tablet TAKE 1 TABLET(5 MG) BY MOUTH TWICE DAILY 01/26/24  Yes Chandrasekhar, Mahesh A, MD  Evolocumab  (REPATHA ) 140 MG/ML SOSY Inject 140 mg into the skin every 14 (fourteen) days.   Yes [provider]  folic acid -vitamin b complex-vitamin c-selenium-zinc (DIALYVITE ) 3 MG TABS tablet Take 1 tablet by mouth daily.   Yes [provider]  lanthanum  (FOSRENOL ) 1000 MG chewable tablet Chew 1,000 mg by mouth 3 (three) times daily with meals. 04/19/24 04/19/25 Yes [provider]  montelukast  (SINGULAIR ) 10 MG tablet TAKE 1 TABLET BY MOUTH EVERY DAY 05/18/24  Yes Jesus Bernardino MATSU, MD   sucralfate  (CARAFATE ) 1 g tablet Take 1 tablet (1 g total) by mouth at bedtime as needed (Stomach pain). 07/21/24  Yes Jesus Bernardino MATSU, MD  azelastine  (ASTELIN ) 0.1 % nasal spray Place 2 sprays into both nostrils 2 (two) times daily. Use in each nostril as directed 06/16/24   Tobie Eldora NOVAK, MD  cephALEXin  (KEFLEX ) 500 MG capsule Take 1 capsule (500 mg total) by mouth 2 (two) times daily. 07/21/24   Jesus Bernardino MATSU, MD  iron sucrose in sodium chloride  0.9 % 100 mL Iron Sucrose (Venofer) 02/20/24 02/18/25  [provider]  ketoconazole  (NIZORAL ) 2 % cream APPLY TO AFFECTED AREA TWICE A DAY 05/18/24   Jesus Bernardino MATSU, MD  losartan  (COZAAR ) 50 MG tablet Take 50 mg by mouth daily. 05/20/24   [provider]  Methoxy PEG-Epoetin Beta (MIRCERA IJ) 75 mcg. 04/02/24 04/01/25  [provider]  metoprolol  succinate (TOPROL -XL) 25 MG 24 hr tablet Take 25 mg by mouth daily. 07/07/24   [provider]  nitroGLYCERIN  (NITROSTAT ) 0.4 MG SL tablet Place 1 tablet (0.4 mg total) under the tongue every 5 (five) minutes as needed for chest pain. 02/10/22   Chandrasekhar, Stanly LABOR, MD  ondansetron  (ZOFRAN -ODT) 4 MG disintegrating tablet Take 1 tablet (4 mg total) by mouth every 8 (eight) hours as needed for nausea or vomiting. 12/16/23   Jesus Bernardino MATSU, MD  oxyCODONE -acetaminophen  (PERCOCET) 5-325 MG tablet Take 1 tablet by mouth every 6 (six) hours as needed for severe pain (pain score 7-10). 05/06/24   Rhyne, Lucie PARAS, PA-C  pantoprazole  (PROTONIX ) 40 MG tablet Take 40 mg by mouth 2 (two) times daily. 06/28/24   [provider]  promethazine  (PHENERGAN ) 25 MG tablet Take 1 tablet (25 mg total) by mouth every 8 (eight) hours as needed for nausea or vomiting. 07/21/24   Jesus Bernardino MATSU, MD  sulfamethoxazole -trimethoprim  (BACTRIM  DS) 800-160 MG tablet Take 1 tablet by mouth daily for 10 days. 07/05/24 07/15/24  Jesus Bernardino MATSU, MD  tirzepatide  (MOUNJARO ) 2.5 MG/0.5ML Pen Inject 2.5 mg into  the skin once a week. Patient not taking: Reported on 07/14/2024 06/18/24   Jesus Bernardino MATSU, MD  torsemide  (DEMADEX ) 10 MG tablet Take 10 mg by mouth daily.    [provider]     Vitals:   08/02/24 2321 08/03/24 0222 08/03/24 0346 08/03/24 0850  BP:  (!) 117/52 (!) 154/57 (!) 130/57  Pulse: 87 86 81 79  Resp:  (!) 25 19 16   Temp:   98.1 F (36.7 C) 98.8 F (37.1 C)  TempSrc:    Oral  SpO2: 100% 100% 99% 97%  Weight:      Height:       Exam Gen somnolent, not responding to questions Sclera anicteric, throat clear  No jvd or bruits Chest clear bilat to bases RRR no MRG Abd soft ntnd no mass or ascites +bs Ext 1+ bilat pretib edema Neuro as above    LIJ TDC intact  Home bp meds: Diltiazem  SR 120mg  bid Losartan  50mg  every day Toprol  xl 25 every day Torsemide  10mg  qd   OP HD: MWF [facility]  Dr Tobie 4h  B400  163kg   3K bath   RIJ TDC    Heparin  none Mircera 100mcg q 2 wks, last 8/29, due 9/12 Last OP HD 9/01, post wt 164.4kg Ave  post wt is 1-2kg over Good compliance  Assessment/ Plan: AMS/ ^NH3/ possible hepatic encephalopathy: suspected cirrhosis, GI consulting. Per pmd/ GI.  ESRD: on HD MWF. Last HD Monday. Plan HD today/ tonight.  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 Next esa due 9/12.        Myer Fret  MD [facility] 08/03/2024, 11:17 AM  Recent Labs  Lab 08/02/24 2340 08/02/24 2351 08/03/24 0623  HGB 10.7* 11.2* 10.8*  ALBUMIN  3.1*  --  3.0*  CALCIUM  10.2  --  9.9  PHOS  --   --  6.8*  CREATININE 6.94* 6.70* 7.20*  K 2.9* 2.9* 3.1*   Inpatient medications:  allopurinol   100 mg Oral Daily   amitriptyline   25 mg Oral QHS   apixaban   5 mg Oral BID   diltiazem   120 mg Oral BID   lactulose   30 g Oral QID   lanthanum   1,000 mg Oral TID WC   losartan   50 mg Oral Daily   metoprolol  succinate  25 mg Oral Daily   montelukast   10 mg Oral Daily    multivitamin  1 tablet Oral QHS   pantoprazole   40 mg Oral BID   rifaximin   550 mg Oral BID   torsemide   10 mg Oral Daily    [START ON 08/04/2024] cefTRIAXone  (ROCEPHIN )  IV     acetaminophen , haloperidol  lactate, nitroGLYCERIN , ondansetron  (ZOFRAN ) IV, sucralfate 

## 2024-08-03 NOTE — Progress Notes (Signed)
 Pt arrived to hemodialysis for his treatment this evening--found to have a contaminated dressing that was laying next to his exposed HD catheter      Immediately performed a sterile dressing change to the site---no reddness or drainage assessed at the exit site--      Notified Dr. Geralynn of this situation---no new orders given at this time      Spoke with the 2W charge RN---[name] RN and discussed that if a patient has a missing dressing dressing for a catheter is in any way exposed that IV therapy team is to be immediately consulted for  treatment for this---and to cover and secure the site with a sterile gauze in the interim---verbalized understanding and stated I will let my staff know this as well---

## 2024-08-03 NOTE — Assessment & Plan Note (Signed)
 Patient noted to have significant pyuria Prior urine culture yielded Klebsiella pneumonia sensitive to cephalosporins. Continue empiric antibiotic therapy with Rocephin  Follow-up results of urine culture

## 2024-08-03 NOTE — Assessment & Plan Note (Deleted)
 BMI 51.35 Complicates overall prognosis and plan of care Lifestyle modification and exercise has been discussed with patient in detail

## 2024-08-03 NOTE — Assessment & Plan Note (Signed)
 Status post CABG Continue metoprolol 

## 2024-08-03 NOTE — Assessment & Plan Note (Signed)
 Paroxysmal Continue metoprolol  and diltiazem  for rate control Continue apixaban  as primary prophylaxis for an acute stroke

## 2024-08-03 NOTE — Progress Notes (Signed)
 Pt admitted with Hemodialysis Cath from outpatient. No way to document assessment of cath in [place] although catheter showing in LDA. Cath wrapped in guaze/tape with no antimicrobial disc placed. Pt on scheduled for HD today.

## 2024-08-03 NOTE — Assessment & Plan Note (Signed)
 Continue metoprolol , losartan  and diltiazem 

## 2024-08-03 NOTE — Assessment & Plan Note (Signed)
 Dialysis days are Monday/Wednesday/Friday Nephrology has been consulted for renal replacement therapy

## 2024-08-03 NOTE — Assessment & Plan Note (Signed)
 Continue allopurinol 

## 2024-08-03 NOTE — Progress Notes (Signed)
 Pt receives out-pt HD at [facility], MWF, 10:05am chair time. Will continue to assist as needed.   Azie Mcconahy [facility] [phone]

## 2024-08-03 NOTE — H&P (Signed)
 History and Physical    Patient: Sean [name] FMW:[id number] DOB: Apr 24, 1971 DOA: 08/02/2024 DOS: the patient was seen and examined on 08/03/2024 PCP: Jesus Bernardino MATSU, MD  Patient coming from: Home  Chief Complaint:  Chief Complaint  Patient presents with   Altered Mental Status   Most of the history is obtained from family at the bedside. HPI: Sean [name] is a 53 y.o. male with medical history significant for morbid obesity with a BMI of 51, history of chronic diastolic dysfunction CHF with a last known LVEF of 50 to 55%, history of liver cirrhosis, history of MGUS, end-stage renal disease on hemodialysis with dialysis days on Monday/Wednesday/Friday, history of paroxysmal A-fib on chronic anticoagulation therapy, coronary artery disease status post CABG who presents to the emergency room via EMS for evaluation of altered mental status. Per his aunt at the bedside he was in his usual state of health until Monday, September 1 when he returned home after dialysis and has been acting like he was drunk.  She notes that he has been confused and has been walking around the house without his clothes on which is unusual for him.  His mother tried to get him to come to the hospital but he declined.  EMS was called due to worsening of his symptoms. Unable to do his review of systems due to altered mental status Vitals upon arrival to the ER showed a blood pressure of 117/52, temp 97.8 Abnormal labs include potassium 3.1, BUN 57, creatinine 7.20, ammonia 151, hemoglobin 10.8 Urine analysis showed pyuria with recent urine culture from 07/25 which was positive for Klebsiella pneumonia sensitive to cephalosporins CT scan of the head without contrast showed no acute intracranial findings Twelve-lead EKG showed normal sinus rhythm He received a dose of Rocephin  in the ER as well as lactulose  and admission has been requested for further evaluation. Nephrology has also been consulted for renal  replacement therapy      Review of Systems: As mentioned in the history of present illness. All other systems reviewed and are negative. Past Medical History:  Diagnosis Date   Abnormal liver enzymes    a. Sees a doctor in [place].   Acute renal failure superimposed on stage 5 chronic kidney disease, not on chronic dialysis (HCC) 01/08/2024   AKI (acute kidney injury) (HCC) 06/17/2023   Atrial fibrillation (HCC)    Cardiac cirrhosis    a. possible elevated LFTs/low platelets felt due to cardiac cirrhosis per 2017 admission.   Chronic combined systolic and diastolic CHF (congestive heart failure) (HCC)    Chronic kidney disease, stage 4 (severe) (HCC) 01/04/2016   Lab Results      Component    Value    Date/Time           GFR    29.24 (L)    09/30/2022 02:51 PM           GFR    27.27 (L)    09/24/2022 03:40 PM           GFR    24.10 (L)    09/03/2022 03:45 PM           GFR    18.98 (L)    08/25/2022 12:21 PM           GFR    38.98 (L)    04/24/2022 02:39 PM   Seeing [facility]rologist: Jerrye Katheryn BROCKS, MD  Lastt plan 04/2023: # CKD stage IV    CKD (  chronic kidney disease) stage 3, GFR 30-59 ml/min (HCC) 01/04/2016   Lab Results  Component  Value  Date/Time     GFR  29.24 (L)  09/30/2022 02:51 PM     GFR  27.27 (L)  09/24/2022 03:40 PM     GFR  24.10 (L)  09/03/2022 03:45 PM     GFR  18.98 (L)  08/25/2022 12:21 PM     GFR  38.98 (L)  04/24/2022 02:39 PM         CKD (chronic kidney disease), stage II    Stage 4   Congenital heart defect    a. rightward rotation of heart, almost dextrocardia   Coronary artery disease    a. s/p CABGx1 in 12/2015.   Dyspnea    with exertion   Gastric polyps 11/25/2022   Gastritis and gastroduodenitis 11/25/2022          Gastroesophageal reflux disease with esophagitis and hemorrhage 04/04/2021   Hematuria    a. Chronic hx of this, no prior etiology determined through workup per patient.   History of umbilical hernia repair 07/10/2011    History of hernia surgery twice prior   Hypercholesteremia    a. Prev taken off statin due to abnormal liver function.   Hypertension    Incisional hernia 07/10/2011   History of hernia surgery twice prior   Morbid obesity (HCC)    Morbid obesity with BMI of 50.0-59.9, adult (HCC) 01/04/2016   After [facility] doctor is setting him up with a wellness clinic to help with weight losS  He reports not having tried anything for weight loss either diet or or medicine or surgery in the past  We failed to get Wegovy  10/2022      Wt Readings from Last 10 Encounters:  11/13/22  (!) 384 lb 12.8 oz (174.5 kg)  10/13/22  (!) 385 lb (174.6 kg)  09/30/22  (!) 384 lb (174.2 kg)  09/24/22  (!) 382 lb (1   Nausea and vomiting 11/25/2022   OSA on CPAP    Paroxysmal atrial flutter (HCC) 02/12/2016   PONV (postoperative nausea and vomiting)    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   LIMA to LAD   Sinus bradycardia    Thrombocytopenia (HCC)    Past Surgical History:  Procedure Laterality Date   APPENDECTOMY     AV FISTULA PLACEMENT Left 01/11/2024   Procedure: LEFT RADIOCEPHALIC ARTERIOVENOUS (AV) FISTULA CREATION;  Surgeon: Lanis Fonda BRAVO, MD;  Location: [facility] OR;  Service: Vascular;  Laterality: Left;   BIOPSY  02/19/2021   Procedure: BIOPSY;  Surgeon: Eda Iha, MD;  Location: [facility] ENDOSCOPY;  Service: Gastroenterology;;   BIOPSY  11/25/2022   Procedure: BIOPSY;  Surgeon: Eda Iha, MD;  Location: [facility];  Service: Gastroenterology;;   CARDIAC CATHETERIZATION N/A 12/24/2015   Procedure: Right/Left Heart Cath and Coronary Angiography;  Surgeon: Toribio JONELLE Fuel, MD;  Location: [facility] INVASIVE CV LAB;  Service: Cardiovascular;  Laterality: N/A;   CARDIOVERSION N/A 02/14/2016   Procedure: CARDIOVERSION;  Surgeon: Vinie JAYSON Maxcy, MD;  Location: [facility] ENDOSCOPY;  Service: Cardiovascular;  Laterality: N/A;   COLONOSCOPY WITH PROPOFOL  N/A 02/19/2021   Procedure: COLONOSCOPY WITH PROPOFOL ;  Surgeon:  Eda Iha, MD;  Location: [facility] ENDOSCOPY;  Service: Gastroenterology;  Laterality: N/A;   CORONARY ARTERY BYPASS GRAFT N/A 12/26/2015   Procedure: Off Pump Coronary Artery Bypass Grafting times one using left internal mammary artery;  Surgeon: Sudie VEAR Laine, MD;  Location: [facility] OR;  Service:  Open Heart Surgery;  Laterality: N/A;   ESOPHAGOGASTRODUODENOSCOPY (EGD) WITH PROPOFOL  N/A 02/19/2021   Procedure: ESOPHAGOGASTRODUODENOSCOPY (EGD) WITH PROPOFOL ;  Surgeon: Eda Iha, MD;  Location: [facility] ENDOSCOPY;  Service: Gastroenterology;  Laterality: N/A;   ESOPHAGOGASTRODUODENOSCOPY (EGD) WITH PROPOFOL  N/A 11/25/2022   Procedure: ESOPHAGOGASTRODUODENOSCOPY (EGD) WITH PROPOFOL ;  Surgeon: Eda Iha, MD;  Location: [facility]OS[facility];  Service: Gastroenterology;  Laterality: N/A;   FISTULA SUPERFICIALIZATION Left 05/06/2024   Procedure: FISTULA SUPERFICIALIZATION;  Surgeon: Sheree Penne Bruckner, MD;  Location: [facility] OR;  Service: Vascular;  Laterality: Left;   GALLBLADDER SURGERY     HERNIA REPAIR     INSERTION OF DIALYSIS CATHETER Right 01/11/2024   Procedure: INSERTION OF TUNNELED  DIALYSIS CATHETER RIGHT INTERNAL JUGULAR;  Surgeon: Lanis Fonda BRAVO, MD;  Location: [facility] OR;  Service: Vascular;  Laterality: Right;   IR TRANSCATHETER BX  06/26/2023   IR US  GUIDE VASC ACCESS RIGHT  06/26/2023   IR VENOGRAM HEPATIC W HEMODYNAMIC EVALUATION  06/26/2023   LEFT HEART CATH AND CORS/GRAFTS ANGIOGRAPHY N/A 04/15/2017   Procedure: Left Heart Cath and Cors/Grafts Angiography;  Surgeon: Burnard Debby LABOR, MD;  Location: [facility] INVASIVE CV LAB;  Service: Cardiovascular;  Laterality: N/A;   LEFT HEART CATH AND CORS/GRAFTS ANGIOGRAPHY N/A 11/02/2020   Procedure: LEFT HEART CATH AND CORS/GRAFTS ANGIOGRAPHY;  Surgeon: Wonda Sharper, MD;  Location: [facility] INVASIVE CV LAB;  Service: Cardiovascular;  Laterality: N/A;   LIGATION OF COMPETING BRANCHES OF ARTERIOVENOUS FISTULA Left 05/06/2024   Procedure: LIGATION OF COMPETING  BRANCHES OF ARTERIOVENOUS FISTULA;  Surgeon: Sheree Penne Bruckner, MD;  Location: [facility] OR;  Service: Vascular;  Laterality: Left;   POLYPECTOMY  02/19/2021   Procedure: POLYPECTOMY;  Surgeon: Eda Iha, MD;  Location: [facility] ENDOSCOPY;  Service: Gastroenterology;;   TEE WITHOUT CARDIOVERSION N/A 12/26/2015   Procedure: TRANSESOPHAGEAL ECHOCARDIOGRAM (TEE);  Surgeon: Sudie VEAR Laine, MD;  Location: [facility] OR;  Service: Open Heart Surgery;  Laterality: N/A;   TEE WITHOUT CARDIOVERSION N/A 02/14/2016   Procedure: TRANSESOPHAGEAL ECHOCARDIOGRAM (TEE);  Surgeon: Vinie JAYSON Maxcy, MD;  Location: [facility] ENDOSCOPY;  Service: Cardiovascular;  Laterality: N/A;   Social History:  reports that he has never smoked. He has never used smokeless tobacco. He reports that he does not currently use alcohol. He reports that he does not use drugs.  Allergies  Allergen Reactions   Glucophage  [Metformin ] Other (See Comments)    Pt told by his nephrologist not to take this medication due to his kidney function.   Vibra -Tab [Doxycycline ] Shortness Of Breath   Chlorhexidine  Itching   Chlorhexidine  Gluconate Itching and Other (See Comments)    burning   Augmentin [Amoxicillin-Pot Clavulanate] Itching   Imdur  [Isosorbide  Dinitrate] Diarrhea   Tape Rash   Valacyclovir  Itching    Family History  Problem Relation Age of Onset   CAD Father        4 stents ~ 61   Diabetes Father    Diabetes Maternal Grandfather    Diabetes Maternal Uncle    Heart attack Other    Hyperlipidemia Other     Prior to Admission medications   Medication Sig Start Date End Date Taking? Authorizing Provider  allopurinol  (ZYLOPRIM ) 100 MG tablet TAKE 1 TABLET BY MOUTH EVERY DAY 05/18/24  Yes Jesus Bernardino MATSU, MD  amitriptyline  (ELAVIL ) 25 MG tablet TAKE 1 TABLET BY MOUTH EVERYDAY AT BEDTIME 08/02/24  Yes May, Deanna J, NP  B Complex-C-Folic Acid  (DIALYVITE  TABLET) TABS Take 1 tablet by mouth daily. 05/02/24  Yes [provider]  Cyanocobalamin  (VITAMIN B-12) 5000 MCG SUBL Place 5,000 mcg under the tongue daily.   Yes [provider]  diltiazem  (CARDIZEM  SR) 120 MG 12 hr capsule TAKE 1 CAPSULE BY MOUTH TWICE A DAY 07/09/24  Yes Jesus Bernardino MATSU, MD  ELIQUIS  5 MG TABS tablet TAKE 1 TABLET(5 MG) BY MOUTH TWICE DAILY 01/26/24  Yes Chandrasekhar, Mahesh A, MD  Evolocumab  (REPATHA ) 140 MG/ML SOSY Inject 140 mg into the skin every 14 (fourteen) days.   Yes [provider]  folic acid -vitamin b complex-vitamin c-selenium-zinc (DIALYVITE ) 3 MG TABS tablet Take 1 tablet by mouth daily.   Yes [provider]  lanthanum  (FOSRENOL ) 1000 MG chewable tablet Chew 1,000 mg by mouth 3 (three) times daily with meals. 04/19/24 04/19/25 Yes [provider]  montelukast  (SINGULAIR ) 10 MG tablet TAKE 1 TABLET BY MOUTH EVERY DAY 05/18/24  Yes Jesus Bernardino MATSU, MD  sucralfate  (CARAFATE ) 1 g tablet Take 1 tablet (1 g total) by mouth at bedtime as needed (Stomach pain). 07/21/24  Yes Jesus Bernardino MATSU, MD  azelastine  (ASTELIN ) 0.1 % nasal spray Place 2 sprays into both nostrils 2 (two) times daily. Use in each nostril as directed 06/16/24   Tobie Eldora NOVAK, MD  cephALEXin  (KEFLEX ) 500 MG capsule Take 1 capsule (500 mg total) by mouth 2 (two) times daily. 07/21/24   Jesus Bernardino MATSU, MD  iron sucrose in sodium chloride  0.9 % 100 mL Iron Sucrose (Venofer) 02/20/24 02/18/25  [provider]  ketoconazole  (NIZORAL ) 2 % cream APPLY TO AFFECTED AREA TWICE A DAY 05/18/24   Jesus Bernardino MATSU, MD  losartan  (COZAAR ) 50 MG tablet Take 50 mg by mouth daily. 05/20/24   [provider]  Methoxy PEG-Epoetin Beta (MIRCERA IJ) 75 mcg. 04/02/24 04/01/25  [provider]  metoprolol  succinate (TOPROL -XL) 25 MG 24 hr tablet Take 25 mg by mouth daily. 07/07/24   [provider]  nitroGLYCERIN  (NITROSTAT ) 0.4 MG SL tablet Place 1 tablet (0.4 mg total) under the tongue every 5 (five) minutes as needed for chest pain.  02/10/22   Chandrasekhar, Stanly LABOR, MD  ondansetron  (ZOFRAN -ODT) 4 MG disintegrating tablet Take 1 tablet (4 mg total) by mouth every 8 (eight) hours as needed for nausea or vomiting. 12/16/23   Jesus Bernardino MATSU, MD  oxyCODONE -acetaminophen  (PERCOCET) 5-325 MG tablet Take 1 tablet by mouth every 6 (six) hours as needed for severe pain (pain score 7-10). 05/06/24   Rhyne, Lucie PARAS, PA-C  pantoprazole  (PROTONIX ) 40 MG tablet Take 40 mg by mouth 2 (two) times daily. 06/28/24   [provider]  promethazine  (PHENERGAN ) 25 MG tablet Take 1 tablet (25 mg total) by mouth every 8 (eight) hours as needed for nausea or vomiting. 07/21/24   Jesus Bernardino MATSU, MD  sulfamethoxazole -trimethoprim  (BACTRIM  DS) 800-160 MG tablet Take 1 tablet by mouth daily for 10 days. 07/05/24 07/15/24  Jesus Bernardino MATSU, MD  tirzepatide  (MOUNJARO ) 2.5 MG/0.5ML Pen Inject 2.5 mg into the skin once a week. Patient not taking: Reported on 07/14/2024 06/18/24   Jesus Bernardino MATSU, MD  torsemide  (DEMADEX ) 10 MG tablet Take 10 mg by mouth daily.    [provider]    Physical Exam: Vitals:   08/02/24 2321 08/03/24 0222 08/03/24 0346 08/03/24 0850  BP:  (!) 117/52 (!) 154/57 (!) 130/57  Pulse: 87 86 81 79  Resp:  (!) 25 19 16   Temp:   98.1 F (36.7 C) 98.8 F (37.1 C)  TempSrc:    Oral  SpO2: 100% 100% 99% 97%  Weight:      Height:       Physical Exam Vitals and nursing note reviewed.  Constitutional:      Appearance: He is obese.     Comments: Oriented to person and place but not to time  HENT:     Head: Normocephalic.     Nose: Nose normal.     Mouth/Throat:     Mouth: Mucous membranes are dry.  Eyes:     Comments: Pale conjunctiva  Cardiovascular:     Rate and Rhythm: Normal rate and regular rhythm.  Pulmonary:     Effort: Pulmonary effort is normal.     Breath sounds: Normal breath sounds.  Abdominal:     Palpations: Abdomen is soft.     Comments: Central adiposity  Skin:    General: Skin is warm  and dry.  Neurological:     General: No focal deficit present.     Mental Status: He is alert.     Comments: Oriented to person and place  Psychiatric:     Comments: Unable to assess     Data Reviewed: Relevant notes from primary care and specialist visits, past discharge summaries as available in EHR, including Care Everywhere. Prior diagnostic testing as pertinent to current admission diagnoses Updated medications and problem lists for reconciliation ED course, including vitals, labs, imaging, treatment and response to treatment Triage notes, nursing and pharmacy notes and ED provider's notes Notable results as noted in HPI Labs reviewed.  Sodium 134, potassium 3.1, chloride 95, bicarb 22, glucose 150, BUN 57, creatinine 7.20, calcium  9.9, magnesium  2.3, total protein 6.8, albumin  3.0, AST 76, ALT 54, alkaline phosphatase 203, total bilirubin 2.2, direct bilirubin 1.0, white count 7.7, hemoglobin 10.8, platelet count 93, Urine analysis showed pyuria Twelve-lead EKG reviewed by me shows sinus rhythm with prolonged PR interval Labs reviewed  Assessment and Plan: * Acute hepatic encephalopathy [facility]) Patient presented for evaluation of altered mental status.  Has a known history of liver cirrhosis and elevated ammonia level of 150. Continue rifaximin  and lactulose  Patient currently requires a safety sitter due to difficulty reorienting him and increased risk for falls.  Decompensated cirrhosis (HCC) Continue metoprolol , lactulose  and rifaximin  Place patient empirically on antibiotic therapy for possible SBP  UTI (urinary tract infection) Patient noted to have significant pyuria Prior urine culture yielded Klebsiella pneumonia sensitive to cephalosporins. Continue empiric antibiotic therapy with Rocephin  Follow-up results of urine culture  Atrial fibrillation (HCC) Paroxysmal Continue metoprolol  and diltiazem  for rate control Continue apixaban  as primary prophylaxis for an acute  stroke   Chronic diastolic CHF (congestive heart failure) (HCC) Not acutely exacerbated Last known LVEF, 2D echocardiogram which was done 05/24 showed an LVEF of 50 to 55% Continue torsemide , metoprolol  and losartan   ESRD (end stage renal disease) (HCC) Dialysis days are Monday/Wednesday/Friday Nephrology has been consulted for renal replacement therapy  Gout Continue allopurinol   Morbid obesity with BMI of 50.0-59.9, adult (HCC) BMI 51.35 Complicates overall prognosis and plan of care Lifestyle modification and exercise has been discussed with patient in detail  Coronary artery disease involving native coronary artery of native heart without angina pectoris Status post CABG Continue metoprolol   Hypertension Continue metoprolol , losartan  and diltiazem       Advance Care Planning:   Code Status: Full Code   Consults: Nephrology  Family Communication: Plan of care discussed with family at the bedside.  All questions and concerns have been addressed.  They verbalized understanding and  agreed with the plan.  Severity of Illness: The appropriate patient status for this patient is INPATIENT. Inpatient status is judged to be reasonable and necessary in order to provide the required intensity of service to ensure the patient's safety. The patient's presenting symptoms, physical exam findings, and initial radiographic and laboratory data in the context of their chronic comorbidities is felt to place them at high risk for further clinical deterioration. Furthermore, it is not anticipated that the patient will be medically stable for discharge from the hospital within 2 midnights of admission.   * I certify that at the point of admission it is my clinical judgment that the patient will require inpatient hospital care spanning beyond 2 midnights from the point of admission due to high intensity of service, high risk for further deterioration and high frequency of surveillance  required.*  Author: Aimee Somerset, MD 08/03/2024 10:23 AM  For on call review [email or web address].

## 2024-08-03 NOTE — Plan of Care (Signed)

## 2024-08-03 NOTE — Assessment & Plan Note (Signed)
 BMI 51.35 Complicates overall prognosis and plan of care Lifestyle modification and exercise has been discussed with patient in detail

## 2024-08-04 ENCOUNTER — Inpatient Hospital Stay (HOSPITAL_COMMUNITY)

## 2024-08-04 DIAGNOSIS — K7682 Hepatic encephalopathy: Secondary | ICD-10-CM | POA: Diagnosis not present

## 2024-08-04 LAB — BASIC METABOLIC PANEL WITH GFR
Anion gap: 16 — ABNORMAL HIGH (ref 5–15)
BUN: 34 mg/dL — ABNORMAL HIGH (ref 6–20)
CO2: 22 mmol/L (ref 22–32)
Calcium: 8.9 mg/dL (ref 8.9–10.3)
Chloride: 97 mmol/L — ABNORMAL LOW (ref 98–111)
Creatinine, Ser: 5.57 mg/dL — ABNORMAL HIGH (ref 0.61–1.24)
GFR, Estimated: 12 mL/min — ABNORMAL LOW (ref >60)
Glucose, Bld: 167 mg/dL — ABNORMAL HIGH (ref 70–99)
Potassium: 3.2 mmol/L — ABNORMAL LOW (ref 3.5–5.1)
Sodium: 135 mmol/L (ref 135–145)

## 2024-08-04 LAB — CBC
HCT: 30.1 % — ABNORMAL LOW (ref 39.0–52.0)
Hemoglobin: 10 g/dL — ABNORMAL LOW (ref 13.0–17.0)
MCH: 35.3 pg — ABNORMAL HIGH (ref 26.0–34.0)
MCHC: 33.2 g/dL (ref 30.0–36.0)
MCV: 106.4 fL — ABNORMAL HIGH (ref 80.0–100.0)
Platelets: 122 K/uL — ABNORMAL LOW (ref 150–400)
RBC: 2.83 MIL/uL — ABNORMAL LOW (ref 4.22–5.81)
RDW: 15.6 % — ABNORMAL HIGH (ref 11.5–15.5)
WBC: 8.8 K/uL (ref 4.0–10.5)
nRBC: 0 % (ref 0.0–0.2)

## 2024-08-04 LAB — HEPATITIS B SURFACE ANTIBODY, QUANTITATIVE: Hep B S AB Quant (Post): 7 m[IU]/mL — ABNORMAL LOW

## 2024-08-04 MED ORDER — SODIUM CHLORIDE 0.9% FLUSH
10.0000 mL | Freq: Two times a day (BID) | INTRAVENOUS | Status: DC
  Administered 2024-08-04 – 2024-08-06 (×4): 10 mL

## 2024-08-04 MED ORDER — SODIUM CHLORIDE 0.9% FLUSH: 10.0000 mL | INTRAVENOUS | Status: DC | PRN

## 2024-08-04 MED ORDER — POTASSIUM CHLORIDE CRYS ER 20 MEQ PO TBCR
20.0000 meq | EXTENDED_RELEASE_TABLET | Freq: Once | ORAL | Status: AC
  Administered 2024-08-04: 20 meq via ORAL
  Filled 2024-08-04: qty 1

## 2024-08-04 MED ORDER — POLYETHYLENE GLYCOL 3350 17 G PO PACK
17.0000 g | PACK | Freq: Every day | ORAL | Status: DC
  Administered 2024-08-04 – 2024-08-06 (×3): 17 g via ORAL
  Filled 2024-08-04 (×3): qty 1

## 2024-08-04 NOTE — Progress Notes (Addendum)
  Hemodialysis treatment completed  Alert and oriented. Anxious at times but coorperative Informed consent signed and in chart.   TX duration: Total 3 hours, 7 minutes due to machine issues, Provider notified  Patient tolerated well.  Transported back to the room  Alert, without acute distress.  Hand-off given to patient's nurse.   Access used: Right dialysis Catheter Access issues: None  Total UF removed: 2500 Medication(s) given: None Post HD VS: T98.4-HR60-RR20-B/P 97/67 Post HD weight: 164.5kg  Neville Seip, RN [facility] D[facility] Unit   08/03/24 2200  Vitals  Temp 98.4 F (36.9 C)  Temp Source Oral  BP 97/67  BP Location Right Arm  BP Method Automatic  Patient Position (if appropriate) Lying  Pulse Rate 60  Pulse Rate Source Monitor  ECG Heart Rate 60  Oxygen Therapy  SpO2 96 %  O2 Device Nasal Cannula  O2 Flow Rate (L/min) 2 L/min  During Treatment Monitoring  Blood Flow Rate (mL/min) 0 mL/min  Arterial Pressure (mmHg) 9.09 mmHg  Venous Pressure (mmHg) -33.53 mmHg  TMP (mmHg) 13.13 mmHg  Ultrafiltration Rate (mL/min) 859 mL/min  Dialysate Flow Rate (mL/min) 299 ml/min  Dialysate Potassium Concentration 4  Dialysate Calcium  Concentration 2.5  Duration of HD Treatment -hour(s) 1.5 hour(s)  Cumulative Fluid Removed (mL) per Treatment  1000.18  Post Treatment  Dialyzer Clearance Clear  Liters Processed 70.2  Fluid Removed (mL) 2500 mL  Tolerated HD Treatment Yes  Post-Hemodialysis Comments see notes  Hemodialysis Catheter Right Internal jugular Double lumen Permanent (Tunneled)  Placement Date/Time: 01/11/24 1132   Placed prior to admission: No  Serial / Lot #: [id number]  Expiration Date: 04/30/28  Time Out: Correct patient;Correct site;Correct procedure  Maximum sterile barrier precautions: Sterile gown;Sterile gloves;Mask;C...  Site Condition No complications  Blue Lumen Status Flushed;Antimicrobial dead end cap;Heparin  locked  Red Lumen Status  Flushed;Antimicrobial dead end cap;Heparin  locked  Purple Lumen Status N/A  Catheter fill solution Heparin  1000 units/ml  Catheter fill volume (Arterial) 1.9 cc  Catheter fill volume (Venous) 1.9  Dressing Type Transparent  Dressing Status Clean, Dry, Intact  Drainage Description None  Dressing Change Due 08/10/24  Post treatment catheter status Capped and Clamped

## 2024-08-04 NOTE — Plan of Care (Signed)

## 2024-08-04 NOTE — Progress Notes (Signed)
   08/04/24 0022  Provider Notification  Provider Name/Title [place], MD  Date Provider Notified 08/04/24  Time Provider Notified 0022  Method of Notification Page (& secure chat)  Notification Reason Other (Comment) (Pt requesting sleep aid. NO prn available. should cardizem  be given since bp is soft and low hr)  Provider response Other (Comment) (advised to hold PO carizem)  Date of Provider Response 08/04/24  Time of Provider Response [phone]

## 2024-08-04 NOTE — Progress Notes (Signed)
  [facility] Progress Note   Subjective:   Seen in room - sitting on edge of bed, talking normally - back to baseline mentally, s/p HD last night with 2.5L off.  Objective Vitals:   08/03/24 2200 08/03/24 2356 08/04/24 0430 08/04/24 0941  BP: 97/67 (!) 91/49 (!) 97/44 (!) 164/97  Pulse: 60 62 61 63  Resp:  20 20 18   Temp: 98.4 F (36.9 C) 98.5 F (36.9 C) 98.1 F (36.7 C) 98 F (36.7 C)  TempSrc: Oral Oral  Oral  SpO2: 96% 99% 100% 100%  Weight:      Height:       Physical Exam General: Well appearing, overweight man, NAD. Room air Heart: RRR; no murmur Lungs: CTAB; no rales Abdomen: distended, non-tender Extremities: no LE edema Dialysis Access: L AVF +t/b, [facility]  Additional Objective Labs: Basic Metabolic Panel: Recent Labs  Lab 08/02/24 2340 08/02/24 2351 08/03/24 0623 08/04/24 0148  NA 134* 135 134* 135  K 2.9* 2.9* 3.1* 3.2*  CL 96* 98 95* 97*  CO2 22  --  22 22  GLUCOSE 154* 151* 150* 167*  BUN 53* 48* 57* 34*  CREATININE 6.94* 6.70* 7.20* 5.57*  CALCIUM  10.2  --  9.9 8.9  PHOS  --   --  6.8*  --    Liver Function Tests: Recent Labs  Lab 08/02/24 2340 08/03/24 0623  AST 75* 76*  ALT 56* 54*  ALKPHOS 217* 203*  BILITOT 2.2* 2.2*  PROT 7.1 6.8  ALBUMIN  3.1* 3.0*   CBC: Recent Labs  Lab 08/02/24 2340 08/02/24 2351 08/03/24 0623 08/04/24 0148  WBC 9.0  --  7.7 8.8  HGB 10.7* 11.2* 10.8* 10.0*  HCT 32.1* 33.0* 31.4* 30.1*  MCV 105.6*  --  105.0* 106.4*  PLT 105*  --  93* 122*   Studies/Results: CT Head Wo Contrast Result Date: 08/03/2024 EXAM: CT HEAD WITHOUT CONTRAST 08/03/2024 12:54:53 AM TECHNIQUE: CT of the head was performed without the administration of intravenous contrast. Automated exposure control, iterative reconstruction, and/or weight based adjustment of the mA/kV was utilized to reduce the radiation dose to as low as reasonably achievable. COMPARISON: None available. CLINICAL HISTORY: Mental status change, unknown  cause. AMS. FINDINGS: BRAIN AND VENTRICLES: No acute hemorrhage. No evidence of acute infarct. No hydrocephalus. No extra-axial collection. No mass effect or midline shift. ORBITS: No acute abnormality. SINUSES: No acute abnormality. SOFT TISSUES AND SKULL: No acute soft tissue abnormality. No skull fracture. IMPRESSION: 1. No acute intracranial abnormality. Electronically signed by: Franky Stanford MD 08/03/2024 12:58 AM EDT RP Workstation: [id number]   Medications:  cefTRIAXone  (ROCEPHIN )  IV      allopurinol   100 mg Oral Daily   amitriptyline   25 mg Oral QHS   apixaban   5 mg Oral BID   diltiazem   120 mg Oral BID   lactulose   30 g Oral QID   lanthanum   1,000 mg Oral TID WC   losartan   50 mg Oral Daily   metoprolol  succinate  25 mg Oral Daily   montelukast   10 mg Oral Daily   multivitamin  1 tablet Oral QHS   pantoprazole   40 mg Oral BID   rifaximin   550 mg Oral BID   sodium chloride  flush  10-40 mL Intracatheter Q12H   torsemide   10 mg Oral Daily   Dialysis Orders MWF - [facility] ([facility] follows) 4:15hr, 400/800, EDW 163kg, 3K/2.5Ca bath, TDC + AVF, no heparin  - Mircera 100mcg IV q 2 weeks (last 8/29,  due 9/12) - Calcitriol 0.5mcg PO q HD - Compliance with HD  Assessment/Plan: AMS/hepatic encephalopathy: Known NASH/cirrhosis and also had a UTI. Ammonia up to 151 on admit, now on lactulose /rifaximin  with improved symptoms. UTI: Being treated with Ceftriaxone  (which also covers for SBP). ESRD: Continue MWF schedule - next tomorrow, higher dialysate K to combat mild hypokalemia. HTN/volume: BP variable, follow. May need to reduce meds.  Anemia of ESRD: Hgb 10 - not due for next ESA dose yet. Secondary HPTH: Ca ok, Phos a little high - continue home meds for now. Nutrition: Alb low, will add supps. pA-Fib: On Eliquis  and BB   Izetta Boehringer, PA-C 08/04/2024, 10:24 AM  [employer]

## 2024-08-04 NOTE — Progress Notes (Signed)
 Daily Progress Note  DOA: 08/02/2024 Hospital Day: 3  Cc: Confusion, possibly hepatic encephalopathy  ASSESSMENT    53 y.o. 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  Admitted with encephalopathy and ammonia level of 150.  Encephalopathy may be secondary to both UTI and hepatic encephalopathy. He may have advanced to cirrhosis since liver biopsy in 2024 which suggested F3 fibrosis.  If he does have cirrhosis, UTI certainly could have led to his decompensation.    UTI Significant pyuria Being treated with Rocephin  pending culture  Macrocytic anemia Chronic and stable   GERD without Barretts Per home med list he takes twice daily PPI at home   ESRD on HD TTS   Atrial fibrillation, on Eliquis    CAD status post CABG 2017 Chronic diastolic heart failure Last known LVEF, 2D echocardiogram which was done 05/24 showed an LVEF of 50 to 55%   Morbid obesity   Stable GI problems: Chronic constipation History of colon polyps History of gastric intestinal metaplasia   PLAN   --Continue lactulose  4 times daily.  He had only 1 bowel movement yesterday and has had none today so going to add daily MiraLAX  --Continue twice daily rifaximin  --Continue twice daily PPI for history of GERD -- Abdominal ultrasound to evaluate for any ascites and if positive >> diagnostic tap  Subjective   Feels fine, no physical complaints.  He has not had a bowel movement today, only had 1 yesterday.  Family in room  Objective   GI Studies:  None this admission  Recent Labs    08/02/24 2340 08/02/24 2351 08/03/24 0623 08/04/24 0148  WBC 9.0  --  7.7 8.8  HGB 10.7* 11.2* 10.8* 10.0*  HCT 32.1* 33.0* 31.4* 30.1*  MCV 105.6*  --  105.0* 106.4*  PLT 105*  --  93* 122*   No results for input(s): FOLATE, VITAMINB12, FERRITIN, TIBC, IRONPCTSAT in the last 72 hours. Recent Labs    08/02/24 2340 08/02/24 2351 08/03/24 0623 08/04/24 0148  NA  134* 135 134* 135  K 2.9* 2.9* 3.1* 3.2*  CL 96* 98 95* 97*  CO2 22  --  22 22  GLUCOSE 154* 151* 150* 167*  BUN 53* 48* 57* 34*  CREATININE 6.94* 6.70* 7.20* 5.57*  CALCIUM  10.2  --  9.9 8.9   Recent Labs    08/02/24 2340 08/03/24 0623  PROT 7.1 6.8  ALBUMIN  3.1* 3.0*  AST 75* 76*  ALT 56* 54*  ALKPHOS 217* 203*  BILITOT 2.2* 2.2*  BILIDIR  --  1.0*  IBILI  --  1.2*     Scheduled inpatient medications:   allopurinol   100 mg Oral Daily   amitriptyline   25 mg Oral QHS   apixaban   5 mg Oral BID   diltiazem   120 mg Oral BID   lactulose   30 g Oral QID   lanthanum   1,000 mg Oral TID WC   losartan   50 mg Oral Daily   metoprolol  succinate  25 mg Oral Daily   montelukast   10 mg Oral Daily   multivitamin  1 tablet Oral QHS   pantoprazole   40 mg Oral BID   potassium chloride   20 mEq Oral Once   rifaximin   550 mg Oral BID   sodium chloride  flush  10-40 mL Intracatheter Q12H   torsemide   10 mg Oral Daily   Continuous inpatient infusions:   cefTRIAXone  (ROCEPHIN )  IV 2 g (08/04/24 1117)   PRN inpatient  medications: acetaminophen , haloperidol  lactate, nitroGLYCERIN , ondansetron  (ZOFRAN ) IV, sodium chloride  flush, sucralfate   Vital signs in last 24 hours: Temp:  [98 F (36.7 C)-98.7 F (37.1 C)] 98 F (36.7 C) (09/04 0941) Pulse Rate:  [58-99] 63 (09/04 0941) Resp:  [16-24] 18 (09/04 0941) BP: (83-164)/(39-97) 164/97 (09/04 0941) SpO2:  [90 %-100 %] 100 % (09/04 0941) Weight:  [832 kg] 167 kg (09/03 1751) Last BM Date : 08/01/24 (PTA)  Intake/Output Summary (Last 24 hours) at 08/04/2024 1354 Last data filed at 08/04/2024 1204 Gross per 24 hour  Intake 720 ml  Output 2500 ml  Net -1780 ml    Intake/Output from previous day: 09/03 0701 - 09/04 0700 In: 458 [P.O.:358; IV Piggyback:100] Out: 2500  Intake/Output this shift: Total I/O In: 480 [P.O.:480] Out: -    Physical Exam:  General: Alert male in NAD sitting up in bedside chair Heart:  Regular rate .   Pulmonary: Normal respiratory effort Abdomen: Soft, nondistended, nontender. Normal bowel sounds. Extremities: No significant lower extremity edema  Neurologic: Alert and oriented.  Mild asterixis Psych: Pleasant. Cooperative     LOS: 1 day   Vina Dasen ,NP 08/04/2024, 1:54 PM

## 2024-08-04 NOTE — Progress Notes (Signed)
 PROGRESS NOTE    Sean [name]  FMW:[id number] DOB: 04-08-1971 DOA: 08/02/2024 PCP: Jesus Bernardino MATSU, MD   Brief Narrative:    Assessment & Plan:   Principal Problem:   Acute hepatic encephalopathy [facility]) Active Problems:   Decompensated cirrhosis (HCC)   UTI (urinary tract infection)   Atrial fibrillation (HCC)   Hypertension   Coronary artery disease involving native coronary artery of native heart without angina pectoris   Morbid obesity with BMI of 50.0-59.9, adult (HCC)   Gout   ESRD (end stage renal disease) (HCC)   Chronic diastolic CHF (congestive heart failure) (HCC)  53 year old male with history of ESRD on hemodialysis, liver cirrhosis, obesity, atrial fibrillation is admitted with metabolic encephalopathy likely hepatic encephalopathy with possible urinary tract infection.   Assessment and Plan: * Acute hepatic encephalopathy [facility]) Patient presented for evaluation of altered mental status.  Has a known history of liver cirrhosis and elevated ammonia level of 150. Continue rifaximin  and lactulose  Mental status appears to be improving. Patient currently requires a safety sitter due to difficulty reorienting him and increased risk for falls.   Decompensated cirrhosis (HCC) Continue metoprolol , lactulose  and rifaximin  Place patient empirically on antibiotic therapy for possible SBP Abdominal ultrasound is pending. Appreciate GI input.   UTI (urinary tract infection) Patient noted to have significant pyuria Prior urine culture yielded Klebsiella pneumonia sensitive to cephalosporins. Continue empiric antibiotic therapy with Rocephin  Follow-up results of urine culture   Atrial fibrillation (HCC) Paroxysmal Continue metoprolol  and diltiazem  for rate control Continue apixaban  as primary prophylaxis for an acute stroke     Chronic diastolic CHF (congestive heart failure) (HCC) Not acutely exacerbated Last known LVEF, 2D echocardiogram which was done 05/24  showed an LVEF of 50 to 55% Continue torsemide , metoprolol  and losartan    ESRD (end stage renal disease) (HCC) Dialysis on Monday/Wednesday/Friday Nephrology on board, no acute indication   Gout Continue allopurinol    Morbid obesity with BMI of 50.0-59.9, adult (HCC) BMI 51.35 Complicates overall prognosis and plan of care Lifestyle modification and exercise has been discussed with patient in detail   Coronary artery disease involving native coronary artery of native heart without angina pectoris Status post CABG Continue metoprolol    Hypertension Continue metoprolol , losartan  and diltiazem   Chronic anemia- multifactorial     DVT prophylaxis: eliquis  Code Status:  full Family Communication: discussed with pt, no family at the bedside Disposition Plan: Status is: Inpatient     Consultants:   Procedures:   Antimicrobials:    Subjective:  Patient seen and examined at the bedside earlier .  He is sitting on edge of the bed, is alert and oriented, vital signs are stable.  He said he had bowel movement yesterday but not today.  Denies abdominal pain, nausea or vomiting.  Objective: Vitals:   08/03/24 2200 08/03/24 2356 08/04/24 0430 08/04/24 0941  BP: 97/67 (!) 91/49 (!) 97/44 (!) 164/97  Pulse: 60 62 61 63  Resp:  20 20 18   Temp: 98.4 F (36.9 C) 98.5 F (36.9 C) 98.1 F (36.7 C) 98 F (36.7 C)  TempSrc: Oral Oral  Oral  SpO2: 96% 99% 100% 100%  Weight:      Height:        Intake/Output Summary (Last 24 hours) at 08/04/2024 1516 Last data filed at 08/04/2024 1204 Gross per 24 hour  Intake 720 ml  Output 2500 ml  Net -1780 ml   Filed Weights   08/02/24 2320 08/03/24 1751  Weight: (!) 167 kg ROLLEN)  167 kg    Examination:  General exam: Appears calm and comfortable  Respiratory system: Bilateral decreased breath sounds at bases Cardiovascular system: S1 & S2 heard, Rate controlled Gastrointestinal system: Abdomen is nondistended, soft and nontender.  Normal bowel sounds heard. Extremities: No cyanosis, clubbing, edema  Central nervous system: Alert and oriented. No focal neurological deficits. Moving extremities Skin: No rashes, lesions or ulcers Psychiatry: Judgement and insight appear normal. Mood & affect appropriate.     Data Reviewed: I have personally reviewed following labs and imaging studies  CBC: Recent Labs  Lab 08/02/24 2340 08/02/24 2351 08/03/24 0623 08/04/24 0148  WBC 9.0  --  7.7 8.8  HGB 10.7* 11.2* 10.8* 10.0*  HCT 32.1* 33.0* 31.4* 30.1*  MCV 105.6*  --  105.0* 106.4*  PLT 105*  --  93* 122*   Basic Metabolic Panel: Recent Labs  Lab 08/02/24 2340 08/02/24 2351 08/03/24 0623 08/04/24 0148  NA 134* 135 134* 135  K 2.9* 2.9* 3.1* 3.2*  CL 96* 98 95* 97*  CO2 22  --  22 22  GLUCOSE 154* 151* 150* 167*  BUN 53* 48* 57* 34*  CREATININE 6.94* 6.70* 7.20* 5.57*  CALCIUM  10.2  --  9.9 8.9  MG  --   --  2.3  --   PHOS  --   --  6.8*  --    GFR: Estimated Creatinine Clearance: 24.9 mL/min (A) (by C-G formula based on SCr of 5.57 mg/dL (H)). Liver Function Tests: Recent Labs  Lab 08/02/24 2340 08/03/24 0623  AST 75* 76*  ALT 56* 54*  ALKPHOS 217* 203*  BILITOT 2.2* 2.2*  PROT 7.1 6.8  ALBUMIN  3.1* 3.0*   No results for input(s): LIPASE, AMYLASE in the last 168 hours. Recent Labs  Lab 08/02/24 2359 08/03/24 0623  AMMONIA 151* 150*   Coagulation Profile: Recent Labs  Lab 08/02/24 2340 08/03/24 0623  INR 1.5* 1.5*   Cardiac Enzymes: No results for input(s): CKTOTAL, CKMB, CKMBINDEX, TROPONINI in the last 168 hours. BNP (last 3 results) No results for input(s): PROBNP in the last 8760 hours. HbA1C: No results for input(s): HGBA1C in the last 72 hours. CBG: No results for input(s): GLUCAP in the last 168 hours. Lipid Profile: No results for input(s): CHOL, HDL, LDLCALC, TRIG, CHOLHDL, LDLDIRECT in the last 72 hours. Thyroid  Function Tests: No results  for input(s): TSH, T4TOTAL, FREET4, T3FREE, THYROIDAB in the last 72 hours. Anemia Panel: No results for input(s): VITAMINB12, FOLATE, FERRITIN, TIBC, IRON, RETICCTPCT in the last 72 hours. Sepsis Labs: No results for input(s): PROCALCITON, LATICACIDVEN in the last 168 hours.  Recent Results (from the past 240 hours)  Culture, blood (routine x 2)     Status: None (Preliminary result)   Collection Time: 08/03/24 12:50 AM   Specimen: BLOOD  Result Value Ref Range Status   Specimen Description BLOOD RIGHT ANTECUBITAL  Final   Special Requests   Final    BOTTLES DRAWN AEROBIC AND ANAEROBIC Blood Culture adequate volume   Culture   Final    NO GROWTH 1 DAY Performed at [facility], [address] [place], [place] [address]    Report Status PENDING  Incomplete  Culture, blood (routine x 2)     Status: None (Preliminary result)   Collection Time: 08/03/24  1:00 AM   Specimen: BLOOD  Result Value Ref Range Status   Specimen Description BLOOD RIGHT ANTECUBITAL  Final   Special Requests   Final  BOTTLES DRAWN AEROBIC AND ANAEROBIC Blood Culture adequate volume   Culture   Final    NO GROWTH 1 DAY Performed at [facility], [address] [place], [place] [address]    Report Status PENDING  Incomplete         Radiology Studies: CT Head Wo Contrast Result Date: 08/03/2024 EXAM: CT HEAD WITHOUT CONTRAST 08/03/2024 12:54:53 AM TECHNIQUE: CT of the head was performed without the administration of intravenous contrast. Automated exposure control, iterative reconstruction, and/or weight based adjustment of the mA/kV was utilized to reduce the radiation dose to as low as reasonably achievable. COMPARISON: None available. CLINICAL HISTORY: Mental status change, unknown cause. AMS. FINDINGS: BRAIN AND VENTRICLES: No acute hemorrhage. No evidence of acute infarct. No hydrocephalus. No extra-axial collection. No mass effect or midline shift. ORBITS: No acute  abnormality. SINUSES: No acute abnormality. SOFT TISSUES AND SKULL: No acute soft tissue abnormality. No skull fracture. IMPRESSION: 1. No acute intracranial abnormality. Electronically signed by: Franky Stanford MD 08/03/2024 12:58 AM EDT RP Workstation: [id number]        Scheduled Meds:  allopurinol   100 mg Oral Daily   amitriptyline   25 mg Oral QHS   apixaban   5 mg Oral BID   diltiazem   120 mg Oral BID   lactulose   30 g Oral QID   lanthanum   1,000 mg Oral TID WC   losartan   50 mg Oral Daily   metoprolol  succinate  25 mg Oral Daily   montelukast   10 mg Oral Daily   multivitamin  1 tablet Oral QHS   pantoprazole   40 mg Oral BID   polyethylene glycol  17 g Oral Daily   potassium chloride   20 mEq Oral Once   rifaximin   550 mg Oral BID   sodium chloride  flush  10-40 mL Intracatheter Q12H   torsemide   10 mg Oral Daily   Continuous Infusions:  cefTRIAXone  (ROCEPHIN )  IV 2 g (08/04/24 1117)          Derryl Duval, MD [facility] 08/04/2024, 3:16 PM

## 2024-08-05 DIAGNOSIS — N390 Urinary tract infection, site not specified: Secondary | ICD-10-CM

## 2024-08-05 DIAGNOSIS — K5900 Constipation, unspecified: Secondary | ICD-10-CM

## 2024-08-05 DIAGNOSIS — E7220 Disorder of urea cycle metabolism, unspecified: Secondary | ICD-10-CM

## 2024-08-05 LAB — RENAL FUNCTION PANEL
Albumin: 2.6 g/dL — ABNORMAL LOW (ref 3.5–5.0)
Anion gap: 17 — ABNORMAL HIGH (ref 5–15)
BUN: 63 mg/dL — ABNORMAL HIGH (ref 6–20)
CO2: 21 mmol/L — ABNORMAL LOW (ref 22–32)
Calcium: 8.3 mg/dL — ABNORMAL LOW (ref 8.9–10.3)
Chloride: 98 mmol/L (ref 98–111)
Creatinine, Ser: 9.96 mg/dL — ABNORMAL HIGH (ref 0.61–1.24)
GFR, Estimated: 6 mL/min — ABNORMAL LOW (ref >60)
Glucose, Bld: 115 mg/dL — ABNORMAL HIGH (ref 70–99)
Phosphorus: 8.4 mg/dL — ABNORMAL HIGH (ref 2.5–4.6)
Potassium: 3.5 mmol/L (ref 3.5–5.1)
Sodium: 136 mmol/L (ref 135–145)

## 2024-08-05 LAB — CBC
HCT: 29.5 % — ABNORMAL LOW (ref 39.0–52.0)
Hemoglobin: 9.4 g/dL — ABNORMAL LOW (ref 13.0–17.0)
MCH: 34.9 pg — ABNORMAL HIGH (ref 26.0–34.0)
MCHC: 31.9 g/dL (ref 30.0–36.0)
MCV: 109.7 fL — ABNORMAL HIGH (ref 80.0–100.0)
Platelets: 79 K/uL — ABNORMAL LOW (ref 150–400)
RBC: 2.69 MIL/uL — ABNORMAL LOW (ref 4.22–5.81)
RDW: 15.6 % — ABNORMAL HIGH (ref 11.5–15.5)
WBC: 7 K/uL (ref 4.0–10.5)
nRBC: 0 % (ref 0.0–0.2)

## 2024-08-05 MED ORDER — HYDROCERIN EX CREA
TOPICAL_CREAM | Freq: Two times a day (BID) | CUTANEOUS | Status: DC
  Filled 2024-08-05: qty 113

## 2024-08-05 MED ORDER — HEPARIN SODIUM (PORCINE) 1000 UNIT/ML IJ SOLN
INTRAMUSCULAR | Status: AC
  Filled 2024-08-05: qty 4

## 2024-08-05 MED ORDER — HEPARIN SODIUM (PORCINE) 1000 UNIT/ML IJ SOLN
3800.0000 [IU] | Freq: Once | INTRAMUSCULAR | Status: AC
  Administered 2024-08-05: 3800 [IU]

## 2024-08-05 MED ORDER — SODIUM CHLORIDE 0.9 % IV BOLUS
250.0000 mL | Freq: Once | INTRAVENOUS | Status: AC
  Administered 2024-08-05: 250 mL via INTRAVENOUS

## 2024-08-05 NOTE — Progress Notes (Signed)
 PROGRESS NOTE    Sean [name]  FMW:[id number] DOB: 05/27/1971 DOA: 08/02/2024 PCP: Jesus Bernardino MATSU, MD   Brief Narrative:    Assessment & Plan:   Principal Problem:   Acute hepatic encephalopathy [facility]) Active Problems:   Decompensated cirrhosis (HCC)   UTI (urinary tract infection)   Atrial fibrillation (HCC)   Hypertension   Coronary artery disease involving native coronary artery of native heart without angina pectoris   Morbid obesity with BMI of 50.0-59.9, adult (HCC)   Gout   ESRD (end stage renal disease) (HCC)   Chronic diastolic CHF (congestive heart failure) (HCC)  53 year old male with history of ESRD on hemodialysis, liver cirrhosis, obesity, atrial fibrillation is admitted with metabolic encephalopathy likely hepatic encephalopathy with possible urinary tract infection.   Assessment and Plan: * Acute hepatic encephalopathy [facility]) Patient presented for evaluation of altered mental status.  Has a known history of liver cirrhosis and elevated ammonia level of 150. Continue rifaximin  and lactulose  as well as MiraLAX  to ensure 3 bowel movements a day. Mental status appears to be at his baseline today.  He has been ambulating.   Decompensated cirrhosis (HCC) Continue metoprolol , lactulose  and rifaximin  Place patient empirically on antibiotic therapy for possible SBP Abdominal ultrasound is pending. Appreciate GI input.  Will continue lactulose , MiraLAX  added. Outpatient follow-up.   UTI (urinary tract infection) Patient noted to have significant pyuria Prior urine culture yielded Klebsiella pneumonia sensitive to cephalosporins. Continue empiric antibiotic therapy with Rocephin   I do not see the reports of urine culture.  Will complete 5 to 7 days of therapy.  Atrial fibrillation (HCC) Paroxysmal Continue metoprolol  and diltiazem  for rate control Continue apixaban  as primary prophylaxis for an acute stroke     Chronic diastolic CHF (congestive heart  failure) (HCC) Not acutely exacerbated Last known LVEF, 2D echocardiogram which was done 05/24 showed an LVEF of 50 to 55% Continue torsemide , metoprolol  and losartan    ESRD (end stage renal disease) (HCC) Dialysis on Monday/Wednesday/Friday Nephrology on board   Gout Continue allopurinol    Morbid obesity with BMI of 50.0-59.9, adult (HCC) BMI 51.35 Complicates overall prognosis and plan of care Lifestyle modification and exercise has been discussed with patient in detail   Coronary artery disease involving native coronary artery of native heart without angina pectoris Status post CABG Continue metoprolol    Hypertension Continue metoprolol , losartan  and diltiazem   Chronic anemia- multifactorial     DVT prophylaxis: eliquis  Code Status:  full Family Communication: discussed with pt, aunt at the bedside Disposition Plan: Anticipate discharge home tomorrow. Status is: Inpatient     Consultants:   Procedures:   Antimicrobials:    Subjective:  Patient seen and examined at the bedside earlier .  He is sitting on edge of the bed, is alert and oriented, vital signs are stable.  He said he had bowel movement yesterday but not today.  Denies abdominal pain, nausea or vomiting.  Objective: Vitals:   08/05/24 0810 08/05/24 0900 08/05/24 1212 08/05/24 1345  BP: (!) 105/47  (!) 101/46 (!) 111/43  Pulse: 64  65 68  Resp: 19  18 (!) 23  Temp: 98 F (36.7 C)  97.9 F (36.6 C) 97.8 F (36.6 C)  TempSrc:   Oral   SpO2: 96% 97% 98%   Weight:      Height:        Intake/Output Summary (Last 24 hours) at 08/05/2024 1415 Last data filed at 08/05/2024 0630 Gross per 24 hour  Intake 346.05 ml  Output --  Net 346.05 ml   Filed Weights   08/02/24 2320 08/03/24 1751  Weight: (!) 167 kg (!) 167 kg    Examination:  General exam: Appears calm and comfortable  Respiratory system: Bilateral decreased breath sounds at bases Cardiovascular system: S1 & S2 heard, Rate  controlled Gastrointestinal system: Abdomen is nondistended, soft and nontender. Normal bowel sounds heard. Extremities: No cyanosis, clubbing, edema  Central nervous system: Alert and oriented. No focal neurological deficits. Moving extremities Skin: No rashes, lesions or ulcers Psychiatry: Judgement and insight appear normal. Mood & affect appropriate.     Data Reviewed: I have personally reviewed following labs and imaging studies  CBC: Recent Labs  Lab 08/02/24 2340 08/02/24 2351 08/03/24 0623 08/04/24 0148  WBC 9.0  --  7.7 8.8  HGB 10.7* 11.2* 10.8* 10.0*  HCT 32.1* 33.0* 31.4* 30.1*  MCV 105.6*  --  105.0* 106.4*  PLT 105*  --  93* 122*   Basic Metabolic Panel: Recent Labs  Lab 08/02/24 2340 08/02/24 2351 08/03/24 0623 08/04/24 0148  NA 134* 135 134* 135  K 2.9* 2.9* 3.1* 3.2*  CL 96* 98 95* 97*  CO2 22  --  22 22  GLUCOSE 154* 151* 150* 167*  BUN 53* 48* 57* 34*  CREATININE 6.94* 6.70* 7.20* 5.57*  CALCIUM  10.2  --  9.9 8.9  MG  --   --  2.3  --   PHOS  --   --  6.8*  --    GFR: Estimated Creatinine Clearance: 24.9 mL/min (A) (by C-G formula based on SCr of 5.57 mg/dL (H)). Liver Function Tests: Recent Labs  Lab 08/02/24 2340 08/03/24 0623  AST 75* 76*  ALT 56* 54*  ALKPHOS 217* 203*  BILITOT 2.2* 2.2*  PROT 7.1 6.8  ALBUMIN  3.1* 3.0*   No results for input(s): LIPASE, AMYLASE in the last 168 hours. Recent Labs  Lab 08/02/24 2359 08/03/24 0623  AMMONIA 151* 150*   Coagulation Profile: Recent Labs  Lab 08/02/24 2340 08/03/24 0623  INR 1.5* 1.5*   Cardiac Enzymes: No results for input(s): CKTOTAL, CKMB, CKMBINDEX, TROPONINI in the last 168 hours. BNP (last 3 results) No results for input(s): PROBNP in the last 8760 hours. HbA1C: No results for input(s): HGBA1C in the last 72 hours. CBG: No results for input(s): GLUCAP in the last 168 hours. Lipid Profile: No results for input(s): CHOL, HDL, LDLCALC, TRIG,  CHOLHDL, LDLDIRECT in the last 72 hours. Thyroid  Function Tests: No results for input(s): TSH, T4TOTAL, FREET4, T3FREE, THYROIDAB in the last 72 hours. Anemia Panel: No results for input(s): VITAMINB12, FOLATE, FERRITIN, TIBC, IRON, RETICCTPCT in the last 72 hours. Sepsis Labs: No results for input(s): PROCALCITON, LATICACIDVEN in the last 168 hours.  Recent Results (from the past 240 hours)  Culture, blood (routine x 2)     Status: None (Preliminary result)   Collection Time: 08/03/24 12:50 AM   Specimen: BLOOD  Result Value Ref Range Status   Specimen Description BLOOD RIGHT ANTECUBITAL  Final   Special Requests   Final    BOTTLES DRAWN AEROBIC AND ANAEROBIC Blood Culture adequate volume   Culture   Final    NO GROWTH 2 DAYS Performed at [facility], [address] [place], [place] [address]    Report Status PENDING  Incomplete  Culture, blood (routine x 2)     Status: None (Preliminary result)   Collection Time: 08/03/24  1:00 AM   Specimen: BLOOD  Result Value  Ref Range Status   Specimen Description BLOOD RIGHT ANTECUBITAL  Final   Special Requests   Final    BOTTLES DRAWN AEROBIC AND ANAEROBIC Blood Culture adequate volume   Culture   Final    NO GROWTH 2 DAYS Performed at [facility], [address], [place], [place] [address]    Report Status PENDING  Incomplete         Radiology Studies: US  Abdomen Complete Result Date: 08/04/2024 CLINICAL DATA:  Encephalopathy.  Cirrhosis of liver. EXAM: ABDOMEN ULTRASOUND COMPLETE COMPARISON:  Right upper quadrant ultrasound 07/14/2024 FINDINGS: Gallbladder: Surgically absent. Common bile duct: Diameter: 6.8 mm Liver: No focal lesion identified. Decrease in parenchymal echogenicity. Portal vein is patent on color Doppler imaging with normal direction of blood flow towards the liver. IVC: No abnormality visualized.  Limited evaluation. Pancreas: Not well seen secondary to overlying bowel  gas. Spleen: Size and appearance within normal limits.  10.5 cm. Right Kidney: Length: 8.1 cm. Limited evaluation. Echogenicity within normal limits. No mass or hydronephrosis visualized. Left Kidney: Length: 7.5 cm. Limited evaluation. Echogenicity within normal limits. No mass or hydronephrosis visualized. Abdominal aorta: No aneurysm visualized.  1.6 cm. Other findings: None. IMPRESSION: 1. Decrease in parenchymal echogenicity of the liver. This is a nonspecific finding but can be seen in the setting of hepatitis. 2. Status post cholecystectomy. 3. Limited evaluation of the pancreas and kidneys. Electronically Signed   By: Greig Pique M.D.   On: 08/04/2024 22:00        Scheduled Meds:  allopurinol   100 mg Oral Daily   amitriptyline   25 mg Oral QHS   apixaban   5 mg Oral BID   diltiazem   120 mg Oral BID   hydrocerin   Topical BID   lactulose   30 g Oral QID   lanthanum   1,000 mg Oral TID WC   losartan   50 mg Oral Daily   metoprolol  succinate  25 mg Oral Daily   montelukast   10 mg Oral Daily   multivitamin  1 tablet Oral QHS   pantoprazole   40 mg Oral BID   polyethylene glycol  17 g Oral Daily   rifaximin   550 mg Oral BID   sodium chloride  flush  10-40 mL Intracatheter Q12H   torsemide   10 mg Oral Daily   Continuous Infusions:  cefTRIAXone  (ROCEPHIN )  IV 2 g (08/05/24 1151)          Derryl Duval, MD [facility] 08/05/2024, 2:15 PM

## 2024-08-05 NOTE — Progress Notes (Signed)
 Daily Progress Note  DOA: 08/02/2024 Hospital Day: 4  Cc:  altered mental status, concern for hepatic encephalopathy  ASSESSMENT    53 y.o. 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  Admitted with encephalopathy and ammonia level of 150.  Encephalopathy may have been 2/2 to both UTI and hepatic encephalopathy though he doesn't carry a diagnosis of cirrhosis. Liver biopsy July 2024 showed only F3 fibrosis and no evidence for cirrhosis / portal hypertension on abdominal US  yesterday. However, his platelets are low.    Constipation Needs bowel regimen at home. Reasonable to stay on Lactulose  ( started in hospital) , especially since hepatic encephalopathy as cause of confusion was not excluded.  We had to had to add daily MiraLAX  to his regimen as well for refractory constipation.   Hypokalemia   UTI Significant pyuria Being treated with Rocephin  pending culture   Macrocytic anemia Chronic and stable   GERD without Barretts Per home med list he takes twice daily PPI at home   ESRD on HD TTS   Atrial fibrillation, on Eliquis    CAD status post CABG 2017 Chronic diastolic heart failure Last known LVEF, 2D echocardiogram which was done 05/24 showed an LVEF of 50 to 55%   Morbid obesity   Stable GI problems: Chronic constipation History of colon polyps History of gastric intestinal metaplasia   Principal Problem:   Acute hepatic encephalopathy (HCC) Active Problems:   Hypertension   Coronary artery disease involving native coronary artery of native heart without angina pectoris   Morbid obesity with BMI of 50.0-59.9, adult (HCC)   Gout   ESRD (end stage renal disease) (HCC)   Atrial fibrillation (HCC)   Chronic diastolic CHF (congestive heart failure) (HCC)   Decompensated cirrhosis (HCC)   UTI (urinary tract infection)    PLAN   --Continue lactulose  4 times daily.  Goal is for 3 BMs a day. If not having 3 BMs a day then add  daily MiraLAX .  If exceeds daily BM goal can titrate dose of Lactulose  downward as needed to achieve goal.  --Asked patient to contact Stephane Quest, NP at [facility] to arrange for follow up.   Subjective   No physical complaints.  Had 2 BMs yesterday.   Objective     Recent Labs    08/02/24 2340 08/02/24 2351 08/03/24 0623 08/04/24 0148  WBC 9.0  --  7.7 8.8  HGB 10.7* 11.2* 10.8* 10.0*  HCT 32.1* 33.0* 31.4* 30.1*  MCV 105.6*  --  105.0* 106.4*  PLT 105*  --  93* 122*   No results for input(s): FOLATE, VITAMINB12, FERRITIN, TIBC, IRONPCTSAT in the last 72 hours. Recent Labs    08/02/24 2340 08/02/24 2351 08/03/24 0623 08/04/24 0148  NA 134* 135 134* 135  K 2.9* 2.9* 3.1* 3.2*  CL 96* 98 95* 97*  CO2 22  --  22 22  GLUCOSE 154* 151* 150* 167*  BUN 53* 48* 57* 34*  CREATININE 6.94* 6.70* 7.20* 5.57*  CALCIUM  10.2  --  9.9 8.9   Recent Labs    08/02/24 2340 08/03/24 0623  PROT 7.1 6.8  ALBUMIN  3.1* 3.0*  AST 75* 76*  ALT 56* 54*  ALKPHOS 217* 203*  BILITOT 2.2* 2.2*  BILIDIR  --  1.0*  IBILI  --  1.2*      Imaging:  US  Abdomen Complete CLINICAL DATA:  Encephalopathy.  Cirrhosis of liver.  EXAM: ABDOMEN ULTRASOUND COMPLETE  COMPARISON:  Right upper quadrant ultrasound 07/14/2024  FINDINGS: Gallbladder: Surgically absent.  Common bile duct: Diameter: 6.8 mm  Liver: No focal lesion identified. Decrease in parenchymal echogenicity. Portal vein is patent on color Doppler imaging with normal direction of blood flow towards the liver.  IVC: No abnormality visualized.  Limited evaluation.  Pancreas: Not well seen secondary to overlying bowel gas.  Spleen: Size and appearance within normal limits.  10.5 cm.  Right Kidney: Length: 8.1 cm. Limited evaluation. Echogenicity within normal limits. No mass or hydronephrosis visualized.  Left Kidney: Length: 7.5 cm. Limited evaluation. Echogenicity within normal limits. No mass or  hydronephrosis visualized.  Abdominal aorta: No aneurysm visualized.  1.6 cm.  Other findings: None.  IMPRESSION: 1. Decrease in parenchymal echogenicity of the liver. This is a nonspecific finding but can be seen in the setting of hepatitis. 2. Status post cholecystectomy. 3. Limited evaluation of the pancreas and kidneys.  Electronically Signed   By: Greig Pique M.D.   On: 08/04/2024 22:00     Scheduled inpatient medications:   allopurinol   100 mg Oral Daily   amitriptyline   25 mg Oral QHS   apixaban   5 mg Oral BID   diltiazem   120 mg Oral BID   lactulose   30 g Oral QID   lanthanum   1,000 mg Oral TID WC   losartan   50 mg Oral Daily   metoprolol  succinate  25 mg Oral Daily   montelukast   10 mg Oral Daily   multivitamin  1 tablet Oral QHS   pantoprazole   40 mg Oral BID   polyethylene glycol  17 g Oral Daily   rifaximin   550 mg Oral BID   sodium chloride  flush  10-40 mL Intracatheter Q12H   torsemide   10 mg Oral Daily   Continuous inpatient infusions:   cefTRIAXone  (ROCEPHIN )  IV 2 g (08/04/24 1117)   PRN inpatient medications: acetaminophen , haloperidol  lactate, nitroGLYCERIN , ondansetron  (ZOFRAN ) IV, sodium chloride  flush, sucralfate   Vital signs in last 24 hours: Temp:  [97.8 F (36.6 C)-99.1 F (37.3 C)] 98 F (36.7 C) (09/05 0810) Pulse Rate:  [59-70] 64 (09/05 0810) Resp:  [18-19] 19 (09/05 0810) BP: (87-105)/(37-77) 105/47 (09/05 0810) SpO2:  [93 %-99 %] 96 % (09/05 0810) Last BM Date : 08/05/24  Intake/Output Summary (Last 24 hours) at 08/05/2024 1032 Last data filed at 08/05/2024 0630 Gross per 24 hour  Intake 586.05 ml  Output --  Net 586.05 ml    Intake/Output from previous day: 09/04 0701 - 09/05 0700 In: 826.1 [P.O.:480; IV Piggyback:346.1] Out: -  Intake/Output this shift: No intake/output data recorded.   Physical Exam:  General: Alert male in NAD Heart:  Regular rate and rhythm.  Pulmonary: Normal respiratory effort Abdomen: Soft,  nondistended, nontender. Normal bowel sounds. Extremities: No lower extremity edema  Neurologic: Alert and oriented Psych: Pleasant. Cooperative     LOS: 2 days   Vina Dasen ,NP 08/05/2024, 10:32 AM

## 2024-08-05 NOTE — Progress Notes (Signed)
 [facility] Progress Note   Subjective:    Seen and examined patient at bedside. A family member also at bedside. Alert and oriented and denies any acute complaints. GI following. Plan for HD today.  Objective Vitals:   08/05/24 0900 08/05/24 1212 08/05/24 1345 08/05/24 1420  BP:  (!) 101/46 (!) 111/43 (!) 121/52  Pulse:  65 68 65  Resp:  18 (!) 23 (!) 29  Temp:  97.9 F (36.6 C) 97.8 F (36.6 C)   TempSrc:  Oral    SpO2: 97% 98%  99%  Weight:      Height:       Physical Exam Gen: Awake, alert, oriented, NAD, on RA Sclera anicteric, throat clear  No jvd or bruits Chest clear bilat to bases RRR no MRG Abd soft ntnd no mass or ascites +bs Ext 1+ bilat pretib edema Neuro as above LIJ TDC intact; LUE bruised-noted perm access  [facility] Weights   08/02/24 2320 08/03/24 1751  Weight: (!) 167 kg (!) 167 kg    Intake/Output Summary (Last 24 hours) at 08/05/2024 1434 Last data filed at 08/05/2024 0630 Gross per 24 hour  Intake 346.05 ml  Output --  Net 346.05 ml    Additional Objective Labs: Basic Metabolic Panel: Recent Labs  Lab 08/02/24 2340 08/02/24 2351 08/03/24 0623 08/04/24 0148  NA 134* 135 134* 135  K 2.9* 2.9* 3.1* 3.2*  CL 96* 98 95* 97*  CO2 22  --  22 22  GLUCOSE 154* 151* 150* 167*  BUN 53* 48* 57* 34*  CREATININE 6.94* 6.70* 7.20* 5.57*  CALCIUM  10.2  --  9.9 8.9  PHOS  --   --  6.8*  --    Liver Function Tests: Recent Labs  Lab 08/02/24 2340 08/03/24 0623  AST 75* 76*  ALT 56* 54*  ALKPHOS 217* 203*  BILITOT 2.2* 2.2*  PROT 7.1 6.8  ALBUMIN  3.1* 3.0*   No results for input(s): LIPASE, AMYLASE in the last 168 hours. CBC: Recent Labs  Lab 08/02/24 2340 08/02/24 2351 08/03/24 0623 08/04/24 0148  WBC 9.0  --  7.7 8.8  HGB 10.7* 11.2* 10.8* 10.0*  HCT 32.1* 33.0* 31.4* 30.1*  MCV 105.6*  --  105.0* 106.4*  PLT 105*  --  93* 122*   Blood Culture    Component Value Date/Time   SDES BLOOD RIGHT ANTECUBITAL  08/03/2024 0100   SPECREQUEST  08/03/2024 0100    BOTTLES DRAWN AEROBIC AND ANAEROBIC Blood Culture adequate volume   CULT  08/03/2024 0100    NO GROWTH 2 DAYS Performed at [facility], [address], [place], [place] [address]    REPTSTATUS PENDING 08/03/2024 0100    Cardiac Enzymes: No results for input(s): CKTOTAL, CKMB, CKMBINDEX, TROPONINI in the last 168 hours. CBG: No results for input(s): GLUCAP in the last 168 hours. Iron Studies: No results for input(s): IRON, TIBC, TRANSFERRIN, FERRITIN in the last 72 hours. Lab Results  Component Value Date   INR 1.5 (H) 08/03/2024   INR 1.5 (H) 08/02/2024   INR 1.1 06/26/2023   PROTIME 10.3 04/10/2023   Studies/Results: US  Abdomen Complete Result Date: 08/04/2024 CLINICAL DATA:  Encephalopathy.  Cirrhosis of liver. EXAM: ABDOMEN ULTRASOUND COMPLETE COMPARISON:  Right upper quadrant ultrasound 07/14/2024 FINDINGS: Gallbladder: Surgically absent. Common bile duct: Diameter: 6.8 mm Liver: No focal lesion identified. Decrease in parenchymal echogenicity. Portal vein is patent on color Doppler imaging with normal direction of blood flow towards the liver. IVC:  No abnormality visualized.  Limited evaluation. Pancreas: Not well seen secondary to overlying bowel gas. Spleen: Size and appearance within normal limits.  10.5 cm. Right Kidney: Length: 8.1 cm. Limited evaluation. Echogenicity within normal limits. No mass or hydronephrosis visualized. Left Kidney: Length: 7.5 cm. Limited evaluation. Echogenicity within normal limits. No mass or hydronephrosis visualized. Abdominal aorta: No aneurysm visualized.  1.6 cm. Other findings: None. IMPRESSION: 1. Decrease in parenchymal echogenicity of the liver. This is a nonspecific finding but can be seen in the setting of hepatitis. 2. Status post cholecystectomy. 3. Limited evaluation of the pancreas and kidneys. Electronically Signed   By: Greig Pique M.D.   On: 08/04/2024 22:00     Medications:  cefTRIAXone  (ROCEPHIN )  IV 2 g (08/05/24 1151)    allopurinol   100 mg Oral Daily   amitriptyline   25 mg Oral QHS   apixaban   5 mg Oral BID   diltiazem   120 mg Oral BID   hydrocerin   Topical BID   lactulose   30 g Oral QID   lanthanum   1,000 mg Oral TID WC   losartan   50 mg Oral Daily   metoprolol  succinate  25 mg Oral Daily   montelukast   10 mg Oral Daily   multivitamin  1 tablet Oral QHS   pantoprazole   40 mg Oral BID   polyethylene glycol  17 g Oral Daily   rifaximin   550 mg Oral BID   sodium chloride  flush  10-40 mL Intracatheter Q12H   torsemide   10 mg Oral Daily    Dialysis Orders: MWF [facility]  Dr Tobie 4h  B400  163kg   3K bath   RIJ TDC    Heparin  none Mircera 100mcg q 2 wks, last 8/29, due 9/12 Last OP HD 9/01, post wt 164.4kg Ave post wt is 1-2kg over Good compliance  Home bp meds: Diltiazem  SR 120mg  bid Losartan  50mg  every day Toprol  xl 25 every day Torsemide  10mg  every day  Assessment/Plan: AMS/ ^NH3/ possible hepatic encephalopathy: suspected cirrhosis, GI consulting. Per pmd/ GI. Neuro status appears to be at baseline today ESRD: on HD MWF. Last HD Monday. Received HD 9/3 and 2.5L was removed. Plan for HD today. 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 Next esa due 9/12.   Charmaine Piety, NP [facility] 08/05/2024,2:34 PM  LOS: 2 days

## 2024-08-05 NOTE — Progress Notes (Signed)
   08/05/24 1759  Vitals  Temp (!) 97.2 F (36.2 C)  Pulse Rate 70  Resp (!) 23  BP (!) 107/42  SpO2 99 %  O2 Device Room Air  Weight (!) 164.4 kg  Type of Weight Post-Dialysis  Post Treatment  Dialyzer Clearance Clear  Hemodialysis Intake (mL) 0 mL  Liters Processed 73.5  Fluid Removed (mL) 1400 mL  Tolerated HD Treatment Yes   Received patient in bed to unit.  Alert and oriented.  Informed consent signed and in chart.   TX duration:3.5   Patient tolerated well.  Transported back to the room  Alert, without acute distress.  Hand-off given to patient's nurse.   Access used: [email or web address] Access issues: no complications  Total UF removed: 1400 Medication(s) given: none :  Delon [facility]

## 2024-08-06 ENCOUNTER — Telehealth: Payer: Self-pay | Admitting: Family Medicine

## 2024-08-06 MED ORDER — POLYETHYLENE GLYCOL 3350 17 G PO PACK: 17.0000 g | PACK | Freq: Every day | ORAL | 0 refills | Status: DC | PRN

## 2024-08-06 MED ORDER — CEFDINIR 300 MG PO CAPS: 300.0000 mg | ORAL_CAPSULE | Freq: Every day | ORAL | 0 refills | Status: AC

## 2024-08-06 MED ORDER — LACTULOSE 10 GM/15ML PO SOLN: 30.0000 g | Freq: Four times a day (QID) | ORAL | 0 refills | Status: DC

## 2024-08-06 NOTE — Progress Notes (Signed)
 Ok to add stop date of 5d to ceftriaxone  for SBP px per Dr. Mcarthur.  Sergio Batch, PharmD, BCIDP, AAHIVP, CPP Infectious Disease Pharmacist 08/06/2024 9:19 AM

## 2024-08-06 NOTE — Plan of Care (Signed)

## 2024-08-06 NOTE — Telephone Encounter (Signed)
 Received after hours call for pt regarding gram + rods in 1/4 cx's. I see he was dc'd today from [facility] on [facility] . This is likely a contaminant and requires no further adjustment. Will route to his attending doc.

## 2024-08-06 NOTE — Plan of Care (Signed)

## 2024-08-06 NOTE — Discharge Instructions (Signed)

## 2024-08-06 NOTE — Progress Notes (Addendum)
  [facility] Progress Note   Subjective:    Seen and examined patient at bedside. Tolerated yesterday's HD with net UF 1.4L. He reports feeling well with no acute complaints. Plan for discharge today.  Objective Vitals:   08/06/24 0456 08/06/24 0843 08/06/24 0858 08/06/24 0900  BP: (!) 102/44 (!) 89/43 (!) 89/43 (!) 89/43  Pulse: 76 84  84  Resp: 17 18    Temp: 99.2 F (37.3 C) 97.6 F (36.4 C)    TempSrc:  Oral    SpO2: 97% 93%    Weight:      Height:       Physical Exam Gen: Awake, alert, oriented, NAD, on RA Sclera anicteric, throat clear  No jvd or bruits Chest clear bilat to bases RRR no MRG Abd soft ntnd no mass or ascites +bs Ext 1+ bilat pretib edema Neuro as above LIJ TDC intact; LUE bruised-noted perm access  Filed Weights   08/02/24 2320 08/03/24 1751 08/05/24 1759  Weight: (!) 167 kg (!) 167 kg (!) 164.4 kg    Intake/Output Summary (Last 24 hours) at 08/06/2024 1703 Last data filed at 08/05/2024 1759 Gross per 24 hour  Intake --  Output 1400 ml  Net -1400 ml    Additional Objective Labs: Basic Metabolic Panel: Recent Labs  Lab 08/03/24 0623 08/04/24 0148 08/05/24 1455  NA 134* 135 136  K 3.1* 3.2* 3.5  CL 95* 97* 98  CO2 22 22 21*  GLUCOSE 150* 167* 115*  BUN 57* 34* 63*  CREATININE 7.20* 5.57* 9.96*  CALCIUM  9.9 8.9 8.3*  PHOS 6.8*  --  8.4*   Liver Function Tests: Recent Labs  Lab 08/02/24 2340 08/03/24 0623 08/05/24 1455  AST 75* 76*  --   ALT 56* 54*  --   ALKPHOS 217* 203*  --   BILITOT 2.2* 2.2*  --   PROT 7.1 6.8  --   ALBUMIN  3.1* 3.0* 2.6*   No results for input(s): LIPASE, AMYLASE in the last 168 hours. CBC: Recent Labs  Lab 08/02/24 2340 08/02/24 2351 08/03/24 0623 08/04/24 0148 08/05/24 1200  WBC 9.0  --  7.7 8.8 7.0  HGB 10.7*   < > 10.8* 10.0* 9.4*  HCT 32.1*   < > 31.4* 30.1* 29.5*  MCV 105.6*  --  105.0* 106.4* 109.7*  PLT 105*  --  93* 122* 79*   < > = values in this interval not  displayed.   Blood Culture    Component Value Date/Time   SDES BLOOD RIGHT ANTECUBITAL 08/03/2024 0100   SPECREQUEST  08/03/2024 0100    BOTTLES DRAWN AEROBIC AND ANAEROBIC Blood Culture adequate volume   CULT  08/03/2024 0100    NO GROWTH 3 DAYS Performed at [facility], [address], [place], [place] [address]    REPTSTATUS PENDING 08/03/2024 0100    Cardiac Enzymes: No results for input(s): CKTOTAL, CKMB, CKMBINDEX, TROPONINI in the last 168 hours. CBG: No results for input(s): GLUCAP in the last 168 hours. Iron Studies: No results for input(s): IRON, TIBC, TRANSFERRIN, FERRITIN in the last 72 hours. Lab Results  Component Value Date   INR 1.5 (H) 08/03/2024   INR 1.5 (H) 08/02/2024   INR 1.1 06/26/2023   PROTIME 10.3 04/10/2023   Studies/Results: US  Abdomen Complete Result Date: 08/04/2024 CLINICAL DATA:  Encephalopathy.  Cirrhosis of liver. EXAM: ABDOMEN ULTRASOUND COMPLETE COMPARISON:  Right upper quadrant ultrasound 07/14/2024 FINDINGS: Gallbladder: Surgically absent. Common bile duct: Diameter: 6.8 mm Liver:  No focal lesion identified. Decrease in parenchymal echogenicity. Portal vein is patent on color Doppler imaging with normal direction of blood flow towards the liver. IVC: No abnormality visualized.  Limited evaluation. Pancreas: Not well seen secondary to overlying bowel gas. Spleen: Size and appearance within normal limits.  10.5 cm. Right Kidney: Length: 8.1 cm. Limited evaluation. Echogenicity within normal limits. No mass or hydronephrosis visualized. Left Kidney: Length: 7.5 cm. Limited evaluation. Echogenicity within normal limits. No mass or hydronephrosis visualized. Abdominal aorta: No aneurysm visualized.  1.6 cm. Other findings: None. IMPRESSION: 1. Decrease in parenchymal echogenicity of the liver. This is a nonspecific finding but can be seen in the setting of hepatitis. 2. Status post cholecystectomy. 3. Limited evaluation of the  pancreas and kidneys. Electronically Signed   By: Greig Pique M.D.   On: 08/04/2024 22:00    Medications:  cefTRIAXone  (ROCEPHIN )  IV 2 g (08/05/24 1151)    allopurinol   100 mg Oral Daily   amitriptyline   25 mg Oral QHS   apixaban   5 mg Oral BID   diltiazem   120 mg Oral BID   hydrocerin   Topical BID   lactulose   30 g Oral QID   lanthanum   1,000 mg Oral TID WC   losartan   50 mg Oral Daily   metoprolol  succinate  25 mg Oral Daily   montelukast   10 mg Oral Daily   multivitamin  1 tablet Oral QHS   pantoprazole   40 mg Oral BID   polyethylene glycol  17 g Oral Daily   rifaximin   550 mg Oral BID   sodium chloride  flush  10-40 mL Intracatheter Q12H   torsemide   10 mg Oral Daily    Dialysis Orders: MWF [facility]  Dr Tobie 4h  B400  163kg   3K bath   RIJ TDC    Heparin  none Mircera 100mcg q 2 wks, last 8/29, due 9/12 Last OP HD 9/01, post wt 164.4kg Ave post wt is 1-2kg over Good compliance   Home bp meds: Diltiazem  SR 120mg  bid Losartan  50mg  every day Toprol  xl 25 every day Torsemide  10mg  every day  Assessment/Plan: AMS/ ^NH3/ possible hepatic encephalopathy: suspected cirrhosis, GI consulting. Per pmd/ GI. Neuro status appears to be at baseline today ESRD: on HD MWF. Last HD Monday. Received HD 9/3 and 2.5L was removed. S/p HD 9/5. Next HD 9/8. 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 Next esa due 9/12.  Dispo: Okay for discharge from a renal standpoint  Charmaine Piety, NP [facility] 08/06/2024,5:03 PM  LOS: 3 days

## 2024-08-06 NOTE — Plan of Care (Signed)

## 2024-08-07 NOTE — TOC Transition Note (Signed)
 Transition of Care - Initial Contact after Hospitalization  Date of discharge: 08/06/2024  Date of contact: 08/07/24  Method: Phone Spoke to: Patient  Patient contacted to discuss transition of care from recent inpatient hospitalization. Patient was admitted to [facility] from 08/02/24 to 08/06/24 discharge diagnosis of UTI and acute hepatic encephalopathy.   Reviewed Dr. Gena note from 9/6 overnight. Patient's blood cultures from 9/3 showed gram positive rods in 1/4 bottles. Other set is NGTD. Likely a contaminate. Patient was presribed Cefdinir  300mg  X 3 doses at discharge to complete ABX regimen. Awaiting final report results.  The discharge medication list was reviewed. Patient understands the changes and has no concerns.   Patient will return to his outpatient HD unit on: Monday 9/8 at [facility].  Charmaine Piety, NP

## 2024-08-07 NOTE — Discharge Planning (Signed)
 [facility] Patient Discharge Orders- [facility] CLINIC: [facility]  Patient's name: Sean [name] Admit/DC Dates: 08/02/2024 - 08/06/2024  Discharge Diagnoses: Acute hepatic encephalopathy: Ammonia level 150. On Rifaximin , miralax , and lactulose   UTI: Prior urine culture yielded Klebsiella pneumonia sensitive to cephalosporins. Received Rocephin  here. See below for PO ABX plan. Afib: On Metoprolol  and Diltiazem   Aranesp: Given: No  Last Hgb: 9.4 PRBC's Given: No  ESA dose for discharge: Resume mircera 100 mcg IV q 2 weeks  IV Iron dose at discharge: N/A  Heparin  change: No  EDW Change: No   Bath Change: No  Access intervention/Change: No  Calcitriol change: No  Discharge Labs: Calcium  8.3  Phosphorus 8.4  Albumin  2.6  K+ 3.5  IV Antibiotics: Yes Details: UTI: Prior urine culture yielded Klebsiella pneumonia sensitive to cephalosporins. Received Rocephin  here. Getting Cefdinir  300mg  X 3 doses to complete treatment  On Coumadin?: No. On Eliquis    OTHER/APPTS/LAB ORDERS:  Reviewed Dr. Gena note from 9/6 overnight. Patient's blood cultures from 9/3 showed gram positive rods in 1/4 bottles. Other set is NGTD. Likely a contaminate. Patient was presribed Cefdinir  300mg  X 3 doses at discharge. We need to follow-up on these results. Awaiting final report. Notify renal team if final report shows positive results!!!!!!!! We need to follow-up on current perm access at next routine rounds. TDC in use     D/C Meds to be reconciled by nurse after every discharge.  Completed By: Charmaine Piety, NP   Reviewed by: MD:______ RN_______

## 2024-08-08 ENCOUNTER — Telehealth: Payer: Self-pay | Admitting: *Deleted

## 2024-08-08 DIAGNOSIS — N186 End stage renal disease: Secondary | ICD-10-CM | POA: Diagnosis not present

## 2024-08-08 DIAGNOSIS — Z992 Dependence on renal dialysis: Secondary | ICD-10-CM | POA: Diagnosis not present

## 2024-08-08 DIAGNOSIS — L299 Pruritus, unspecified: Secondary | ICD-10-CM | POA: Diagnosis not present

## 2024-08-08 DIAGNOSIS — N2581 Secondary hyperparathyroidism of renal origin: Secondary | ICD-10-CM | POA: Diagnosis not present

## 2024-08-08 DIAGNOSIS — R52 Pain, unspecified: Secondary | ICD-10-CM | POA: Diagnosis not present

## 2024-08-08 LAB — CULTURE, BLOOD (ROUTINE X 2)
Culture: NO GROWTH
Special Requests: ADEQUATE
Special Requests: ADEQUATE

## 2024-08-08 NOTE — Telephone Encounter (Signed)
 Call taken by Oncall provider. Note added.   Patient Name First: Sean Last: [name] Gender: Male DOB: 1971/03/21 Age: 53 Y 7 M 16 D Return Phone Number: [phone] (Primary), [phone] (Secondary) Address: City/ State/ Zip: [place] [address] Client [facility] - Human resources officer [facility] Contact Type Call Who Is Calling Lab / Radiology Lab Name [facility] Lab Phone Number [phone] Lab Tech Name Almarie Schneider Lab Reference Number [id number] Chief Complaint Lab Result (Critical or Stat) Call Type Lab Send to RN Reason for Call Report lab results Initial Comment Caller states a blood culture came up positive. Translation No Nurse Assessment Nurse: Jakie, RN, Derrell Date/Time (Eastern Time): 08/06/2024 6:56:14 PM You have opened lab assessment, do you wish to continue? ---Yes Is there an on-call provider listed? ---Yes Please list name of person reporting value (Lab Employee) and a contact number. ---[phone] Alex Please document the following items: Lab name Lab value (read back to lab to verify) Reference range for lab value Date and time blood was drawn Collect time of birth for bilirubin results ---Positive Blood Culture 1 out of 4 bottles tested positive Anerobic Bottle , Gram positive Rods , Further ID with culture workup 9.3.25 @ 0050 Please collect the patient contact information from the lab. (name, phone number and address) ---Sean [name] [phone] [address] [place] [address] Disp. Time Titus Time) Disposition Final User 08/06/2024 6:47:09 PM Send to Urgent Vallery Kerns, Monalisa 08/06/2024 6:47:39 PM Send To Clinical Follow Up [address], Christina 08/06/2024 7:08:50 PM Send To RN Personal Jakie, RN, Merissa 08/06/2024 7:31:00 PM Called On-Call Provider Jakie, RN, Merissa 08/06/2024 7:32:00 PM Lab Call Jakie, RN,  Derrell Reason: on call provider notified 08/06/2024 7:32:08 PM Clinical Call Yes Jakie, RN, Derrell Final Disposition 08/06/2024 7:32:08 PM Clinical Call Yes Jakie, RN, Derrell Comments User: Derrell Jakie, RN Date/Time Titus Time): 08/06/2024 7:31:35 PM patient is in the hospital per on call provider Paging DoctorName Phone DateTime Result/ Outcome Message Type Notes Frann League [phone] 08/06/2024 7:31:00 PM Called On Call Provider - Reached Doctor Paged [place], League 08/06/2024 7:31:21 PM Spoke with On Call - General Message Result Patient in hospital now per on call provider

## 2024-08-08 NOTE — Telephone Encounter (Signed)
 The patient was recently hospitalized for hepatic encephalopathy, hypokalemia, and a urinary tract infection. His mother called this morning to request a post-hospital follow-up, stating he was advised to be seen within one week. She understands that we will notify Dawn to review his chart and determine the appropriate timing for his clinic visit.   We will contact his mother to schedule once Dawn provides guidance on the timeframe.

## 2024-08-08 NOTE — Transitions of Care (Post Inpatient/ED Visit) (Signed)
 08/08/2024  Name: Sean [name] MRN: [id number] DOB: [birth year]  Today's TOC FU Call Status: Today's TOC FU Call Status:: Successful TOC FU Call Completed TOC FU Call Complete Date: 08/08/24 Patient's Name and Date of Birth confirmed.  Transition Care Management Follow-up Telephone Call Date of Discharge: 08/06/24 Discharge Facility: [facility]) Type of Discharge: Inpatient Admission Primary Inpatient Discharge Diagnosis:: Hepatic encephalopathy, UTI How have you been since you were released from the hospital?: Better (pt states feeling much better, no more confusion, eating, drinking well, ambulating without difficulty, pt is at dialysis session) Any questions or concerns?: No  Items Reviewed: Did you receive and understand the discharge instructions provided?: Yes Medications obtained,verified, and reconciled?: Yes (Medications Reviewed) Any new allergies since your discharge?: No Dietary orders reviewed?: Yes Type of Diet Ordered:: renal Do you have support at home?: Yes Name of Support/Comfort Primary Source: pt does not share who assists him at home Reviewed HF action plan, pt is weighing daily Reviewed signs /symptoms of infection/ UTI Reviewed instructions for lactulose , importance of taking as prescribed  Medications Reviewed Today: Medications Reviewed Today     Reviewed by Aura Mliss LABOR, RN (Registered Nurse) on 08/08/24 at 1052  Med List Status: <None>   Medication Order Taking? Sig Documenting Provider Last Dose Status Informant  allopurinol  (ZYLOPRIM ) 100 MG tablet [id number] Yes TAKE 1 TABLET BY MOUTH EVERY DAY Jesus Bernardino MATSU, MD  Active Self, Mother, Pharmacy Records  amitriptyline  (ELAVIL ) 25 MG tablet [id number] Yes TAKE 1 TABLET BY MOUTH EVERYDAY AT BEDTIME May, Deanna J, NP  Active Self, Mother, Pharmacy Records  azelastine  (ASTELIN ) 0.1 % nasal spray [id number] Yes Place 2 sprays into both nostrils 2 (two) times daily. Use in each nostril as  directed Tobie Eldora NOVAK, MD  Active Self, Mother, Pharmacy Records  B Complex-C-Folic Acid  (DIALYVITE  TABLET) TABS [id number] Yes Take 1 tablet by mouth daily. [provider]  Active Self, Mother, Pharmacy Records  cefdinir  (OMNICEF ) 300 MG capsule [id number] Yes Take 1 capsule (300 mg total) by mouth daily for 3 doses. Take after dialysis on dialysis days Sigdel, Santosh, MD  Active   Cyanocobalamin  (VITAMIN B-12) 5000 MCG SUBL [id number] Yes Place 5,000 mcg under the tongue daily. [provider]  Active Self, Mother, Pharmacy Records  diltiazem  (CARDIZEM  SR) 120 MG 12 hr capsule [id number]  TAKE 1 CAPSULE BY MOUTH TWICE A DAY  Patient not taking: Reported on 08/08/2024   Jesus Bernardino MATSU, MD  Active Self, Mother, Pharmacy Records  ELIQUIS  5 MG TABS tablet [id number] Yes TAKE 1 TABLET(5 MG) BY MOUTH TWICE DAILY Santo Stanly LABOR, MD  Active Self, Mother, Pharmacy Records           Med Note EVERETTE JESUSA BROCKS   Sat Aug 06, 2024 11:34 AM) Last dose was 9.1.2025  9:30p  Evolocumab  (REPATHA ) 140 MG/ML SOSY [id number] Yes Inject 140 mg into the skin every 14 (fourteen) days. [provider]  Active Self, Mother, Pharmacy Records           Med Note EVERETTE JESUSA BROCKS   Dju Aug 06, 2024 11:34 AM) Next dose is due 9.14.2025  folic acid -vitamin b complex-vitamin c-selenium-zinc (DIALYVITE ) 3 MG TABS tablet [id number] Yes Take 1 tablet by mouth daily. [provider]  Active Self, Mother, Pharmacy Records  iron sucrose in sodium chloride  0.9 % 100 mL [id number] Yes Iron Sucrose (Venofer) [provider]  Active Self, Mother, Pharmacy Records  Med Note EVERETTE JESUSA BROCKS   Sat Aug 06, 2024 11:34 AM) Only at dialysis  ketoconazole  (NIZORAL ) 2 % cream [id number]  APPLY TO AFFECTED AREA TWICE A DAY  Patient not taking: Reported on 08/08/2024   Jesus Bernardino MATSU, MD  Active Self, Mother, Pharmacy Records  lactulose  (CHRONULAC ) 10 GM/15ML solution [id number] Yes Take 45  mLs (30 g total) by mouth 4 (four) times daily. Titrate based on the stool frequency. Goal :2-3 BMs a day Sigdel, Derryl, MD  Active   lanthanum  (FOSRENOL ) 1000 MG chewable tablet [id number] Yes Chew 1,000 mg by mouth 3 (three) times daily with meals. [provider]  Active Self, Mother, Pharmacy Records           Med Note EVERETTE JESUSA BROCKS   Dju Aug 06, 2024 11:35 AM) Patient takes 2 capsules 3 times daily  losartan  (COZAAR ) 50 MG tablet [id number]  Take 50 mg by mouth daily.  Patient not taking: Reported on 08/08/2024   [provider]  Active Self, Mother, Pharmacy Records  Methoxy PEG-Epoetin Beta [facility] IJ) [id number] Yes 75 mcg. [provider]  Active Self, Mother, Pharmacy Records           Med Note EVERETTE JESUSA BROCKS   Dju Aug 06, 2024 11:35 AM) At dialysis  metoprolol  succinate (TOPROL -XL) 25 MG 24 hr tablet [id number]  Take 25 mg by mouth daily.  Patient not taking: Reported on 08/08/2024   [provider]  Active Self, Mother, Pharmacy Records  montelukast  (SINGULAIR ) 10 MG tablet [id number] Yes TAKE 1 TABLET BY MOUTH EVERY DAY Jesus Bernardino MATSU, MD  Active Self, Mother, Pharmacy Records  nitroGLYCERIN  (NITROSTAT ) 0.4 MG SL tablet [id number] Yes Place 1 tablet (0.4 mg total) under the tongue every 5 (five) minutes as needed for chest pain. Santo Stanly LABOR, MD  Active Self, Mother, Pharmacy Records           Med Note EVERETTE JESUSA BROCKS   Dju Aug 06, 2024 11:36 AM) Pt states he does need a refill  ondansetron  (ZOFRAN -ODT) 4 MG disintegrating tablet [id number] Yes Take 1 tablet (4 mg total) by mouth every 8 (eight) hours as needed for nausea or vomiting. Jesus Bernardino MATSU, MD  Active Self, Mother, Pharmacy Records  oxyCODONE -acetaminophen  (PERCOCET) 5-325 MG tablet [id number] Yes Take 1 tablet by mouth every 6 (six) hours as needed for severe pain (pain score 7-10). Rhyne, Samantha J, PA-C  Active Self, Mother, Pharmacy Records  pantoprazole  (PROTONIX ) 40 MG  tablet [id number]  Take 40 mg by mouth 2 (two) times daily.  Patient not taking: Reported on 08/08/2024   [provider]  Active Self, Mother, Pharmacy Records  polyethylene glycol (MIRALAX  / GLYCOLAX ) 17 g packet [id number] Yes Take 17 g by mouth daily as needed for severe constipation. Sigdel, Santosh, MD  Active   promethazine  (PHENERGAN ) 25 MG tablet [id number] Yes Take 1 tablet (25 mg total) by mouth every 8 (eight) hours as needed for nausea or vomiting. Jesus Bernardino MATSU, MD  Active Self, Mother, Pharmacy Records  sucralfate  (CARAFATE ) 1 g tablet [id number]  Take 1 tablet (1 g total) by mouth at bedtime as needed (Stomach pain).  Patient not taking: Reported on 08/08/2024   Jesus Bernardino MATSU, MD  Active Self, Mother, Pharmacy Records  technetium tetrofosmin  (TC-MYOVIEW ) injection 29.8 millicurie [id number]   Mona Vinie BROCKS, MD  Active   tirzepatide  (MOUNJARO ) 2.5 MG/0.5ML Pen [id number]  Inject 2.5 mg into the skin once a week.  Patient not  taking: Reported on 08/08/2024   Jesus Bernardino MATSU, MD  Active Self, Mother, Pharmacy Records  torsemide  (DEMADEX ) 10 MG tablet [id number] Yes Take 10 mg by mouth daily. [provider]  Active Self, Mother, Pharmacy Records           Med Note EVERETTE JESUSA BROCKS   Dju Aug 06, 2024 11:38 AM) Emily as needed  Vonoprazan Fumarate  (VOQUEZNA ) 20 MG TABS [id number] Yes Take by mouth. [provider]  Active Self, Mother, Pharmacy Records           Med Note EVERETTE JESUSA BROCKS   Dju Aug 06, 2024 11:38 AM) This is a sample medication the patient received from Dr Jesus  Med List Note Christie, Jeannetta, CPhT 05/04/24 9063): Dialysis Tue., Thurs.,& Sat.            Home Care and Equipment/Supplies: Were Home Health Services Ordered?: No Any new equipment or medical supplies ordered?: No  Functional Questionnaire: Do you need assistance with bathing/showering or dressing?: No Do you need assistance with meal preparation?: No Do you need  assistance with eating?: No Do you have difficulty maintaining continence: No Do you need assistance with getting out of bed/getting out of a chair/moving?: No Do you have difficulty managing or taking your medications?: No  Follow up appointments reviewed: PCP Follow-up appointment confirmed?: Yes Date of PCP follow-up appointment?: 09/08/24 Follow-up Provider: Dr. Bernardino Jesus,  pt declines sooner appointment, declines for RN CM to schedule Specialist Hospital Follow-up appointment confirmed?: Yes Date of Specialist follow-up appointment?: 08/30/24 Follow-Up Specialty Provider:: cardiologist  Dr. Santo,   pt has contact # for Stephane Quest NP at [facility] and will schedule appointment Do you need transportation to your follow-up appointment?: No Do you understand care options if your condition(s) worsen?: Yes-patient verbalized understanding  SDOH Interventions Today    Flowsheet Row Most Recent Value  SDOH Interventions   Food Insecurity Interventions Intervention Not Indicated  Housing Interventions Intervention Not Indicated  Transportation Interventions Intervention Not Indicated  Utilities Interventions Intervention Not Indicated   Mliss Creed [facility], BSN RN Care Manager/ Transition of Care [place]/ [facility] Population Health [phone]

## 2024-08-08 NOTE — Progress Notes (Signed)
 Late note entry 08/08/24 9:03am D/c over weekend noted. Contacted pt out-pt dialysis clinic, [facility], to inform of pt d/c and expected arrival this morning. Have faxed over last nephrology note, will fax d/c summary once available.   Lavanda Yajayra Feldt Dialysis Navigator [phone]

## 2024-08-09 ENCOUNTER — Encounter: Payer: Self-pay | Admitting: Physician Assistant

## 2024-08-09 ENCOUNTER — Ambulatory Visit (INDEPENDENT_AMBULATORY_CARE_PROVIDER_SITE_OTHER): Admitting: Physician Assistant

## 2024-08-09 ENCOUNTER — Telehealth: Payer: Self-pay | Admitting: Internal Medicine

## 2024-08-09 ENCOUNTER — Telehealth: Payer: Self-pay | Admitting: Physician Assistant

## 2024-08-09 VITALS — BP 120/54 | HR 76 | Temp 98.4°F | Ht 71.0 in | Wt 361.0 lb

## 2024-08-09 DIAGNOSIS — K7581 Nonalcoholic steatohepatitis (NASH): Secondary | ICD-10-CM | POA: Diagnosis not present

## 2024-08-09 DIAGNOSIS — J3489 Other specified disorders of nose and nasal sinuses: Secondary | ICD-10-CM

## 2024-08-09 DIAGNOSIS — R8281 Pyuria: Secondary | ICD-10-CM

## 2024-08-09 DIAGNOSIS — I4891 Unspecified atrial fibrillation: Secondary | ICD-10-CM | POA: Diagnosis not present

## 2024-08-09 DIAGNOSIS — N186 End stage renal disease: Secondary | ICD-10-CM

## 2024-08-09 DIAGNOSIS — K7682 Hepatic encephalopathy: Secondary | ICD-10-CM

## 2024-08-09 LAB — URINALYSIS, ROUTINE W REFLEX MICROSCOPIC
Leukocytes,Ua: NEGATIVE
Nitrite: NEGATIVE
Specific Gravity, Urine: >=1.03 — AB (ref 1.000–1.030)
Total Protein, Urine: 100 — AB
Urine Glucose: 100 — AB
Urobilinogen, UA: 4 — AB (ref 0.0–1.0)
pH: 5 (ref 5.0–8.0)

## 2024-08-09 LAB — COMPREHENSIVE METABOLIC PANEL WITH GFR
ALT: 57 U/L — ABNORMAL HIGH (ref 0–53)
AST: 62 U/L — ABNORMAL HIGH (ref 0–37)
Albumin: 3.2 g/dL — ABNORMAL LOW (ref 3.5–5.2)
Alkaline Phosphatase: 322 U/L — ABNORMAL HIGH (ref 39–117)
BUN: 50 mg/dL — ABNORMAL HIGH (ref 6–23)
CO2: 27 meq/L (ref 19–32)
Calcium: 9.1 mg/dL (ref 8.4–10.5)
Chloride: 98 meq/L (ref 96–112)
Creatinine, Ser: 9.15 mg/dL (ref 0.40–1.50)
GFR: 6.11 mL/min — CL (ref >60.00)
Glucose, Bld: 93 mg/dL (ref 70–99)
Potassium: 3.7 meq/L (ref 3.5–5.1)
Sodium: 139 meq/L (ref 135–145)
Total Bilirubin: 5.5 mg/dL — ABNORMAL HIGH (ref 0.2–1.2)
Total Protein: 6.5 g/dL (ref 6.0–8.3)

## 2024-08-09 LAB — CBC WITH DIFFERENTIAL/PLATELET
Basophils Absolute: 0 K/uL (ref 0.0–0.1)
Basophils Relative: 0.6 % (ref 0.0–3.0)
Eosinophils Absolute: 0.5 K/uL (ref 0.0–0.7)
Eosinophils Relative: 8.7 % — ABNORMAL HIGH (ref 0.0–5.0)
HCT: 29.2 % — ABNORMAL LOW (ref 39.0–52.0)
Hemoglobin: 9.9 g/dL — ABNORMAL LOW (ref 13.0–17.0)
Lymphocytes Relative: 18 % (ref 12.0–46.0)
Lymphs Abs: 1 K/uL (ref 0.7–4.0)
MCHC: 33.9 g/dL (ref 30.0–36.0)
MCV: 105.9 fl — ABNORMAL HIGH (ref 78.0–100.0)
Monocytes Absolute: 1.1 K/uL — ABNORMAL HIGH (ref 0.1–1.0)
Monocytes Relative: 20.8 % — ABNORMAL HIGH (ref 3.0–12.0)
Neutro Abs: 2.7 K/uL (ref 1.4–7.7)
Neutrophils Relative %: 51.9 % (ref 43.0–77.0)
Platelets: 51 K/uL — ABNORMAL LOW (ref 150.0–400.0)
RBC: 2.76 Mil/uL — ABNORMAL LOW (ref 4.22–5.81)
RDW: 15.5 % (ref 11.5–15.5)
WBC: 5.3 K/uL (ref 4.0–10.5)

## 2024-08-09 MED ORDER — MUPIROCIN 2 % EX OINT: TOPICAL_OINTMENT | CUTANEOUS | 0 refills | Status: AC

## 2024-08-09 MED ORDER — POLYETHYLENE GLYCOL 3350 17 G PO PACK: 17.0000 g | PACK | Freq: Every day | ORAL | Status: AC | PRN

## 2024-08-09 MED ORDER — DILTIAZEM HCL ER 120 MG PO CP12: 120.0000 mg | ORAL_CAPSULE | Freq: Two times a day (BID) | ORAL | Status: AC

## 2024-08-09 MED ORDER — LACTULOSE 10 GM/15ML PO SOLN: 30.0000 g | Freq: Four times a day (QID) | ORAL | 1 refills | Status: DC | PRN

## 2024-08-09 MED ORDER — RIFAXIMIN 550 MG PO TABS: 550.0000 mg | ORAL_TABLET | Freq: Two times a day (BID) | ORAL | 0 refills | Status: DC

## 2024-08-09 MED ORDER — LOSARTAN POTASSIUM 50 MG PO TABS: 50.0000 mg | ORAL_TABLET | Freq: Every day | ORAL | Status: AC

## 2024-08-09 NOTE — Telephone Encounter (Unsigned)
 Copied from CRM [id number]. Topic: Clinical - Medication Question >> Aug 09, 2024  2:34 PM Shereese L wrote: Reason for CRM: Patient  is needing to know if the medication mupirocin  ointment (BACTROBAN ) 2 % can be in nostril since instruction on script says not to put in eyes/nose/mouth

## 2024-08-09 NOTE — Patient Instructions (Addendum)
 It was great to see you!  Continue MiraLAX  to ensure 3 bowel movements a day.  If you have trouble achieving this, we may need to add back the lactulose , let us  know.  You are also supposed to be on Xifaxan  (rifaximin ) 550 mg twice daily for three months to treat this acute episode of ammonia build-up and confusion. I do not know if your insurance will cover this. We will do prior authorization if needed. I have sent this in   Please resume your losartan  and diltiazem . Please check blood pressure at home and if you have readings that are below your normal, please call Dr Berneice  For your nose, please consider OTC (available over the counter without a prescription) nasal saline spray to keep nasal passages moist. Please use topical mupirocin  antibiotic and apply to inner nare 1-2 times daily.  Please keep your follow up with Dr Jesus in one month.  Take care,  Lucie Buttner PA-C

## 2024-08-09 NOTE — Progress Notes (Signed)
 Sean [name] is a 53 y.o. male here for hospital follow up.  History of Present Illness:   Chief Complaint  Patient presents with   Hospitalization Follow-up    Pt was admitted on 9/2 Acute hepatic encephalopathy, UTI. Discharged on 9/6.   Discussed the use of AI scribe software for clinical note transcription with the patient, who gave verbal consent to proceed.  History of Present Illness Sean [name] is a 53 year old male with multiple chronic medical issues who presents for hospital follow up.  He was admitted to [facility] 08/02/24 - 08/06/24 after being found altered at home by his mom and his aunt, who are his general caregivers. He was brought to hospital via EMS. He reports compliance with hemodialysis.   Work-up revealed elevated ammonia of 151, hemoglobin 10.8, potassium 3.1, and elevated BUN and creatinine. His urinalysis was completed but no culture performed. Head CT negative. Abdominal ultrasound showed Decrease in parenchymal echogenicity of the liver. This is a nonspecific finding but can be seen in the setting of hepatitis. It did not show ascites. He was diagnosed with encephalopathy due to possible urinary tract infection and hepatic dysfunction. He is compliant with hemodialysis so uremia was not considered as possible cause of encephalopathy.  He was given rocephin  on admission.  He was started on twice daily rifaximin  550 mg twice daily and lactulose  30 g four times daily with goal to have 2-3 bowel movements a day. He was given a two day supply of this and then told to transition to miralax  to achieve this goal.  He reports that since he has been home, he switched to Miralax  to maintain three bowel movements daily, experiencing soreness from frequent bowel movements. He typically has regular bowel movements twice a day and had a significant bowel movement the day before hospitalization.  During his hospital stay, he was diagnosed with a UTI and  prescribed cefdinir  300 mg x 3 capsules to be taken after dialysis sessions on Monday, Wednesday, and Friday. No urine culture was performed in the hospital and based on chart review, antibiotic(s) selection was based on his prior urine culture from July. His blood pressure was low during the hospital stay, leading to the temporary discontinuation of diltiazem  and losartan . Blood pressure has since improved, and both medications are available at home.  He reports nasal congestion and dryness since hospital discharge, causing discomfort and difficulty breathing through his nose, especially when using his CPAP machine. Nasal sprays are available at home but have not been used recently.    Past Medical History:  Diagnosis Date   Abnormal liver enzymes    a. Sees a doctor in [place].   Acute renal failure superimposed on stage 5 chronic kidney disease, not on chronic dialysis (HCC) 01/08/2024   AKI (acute kidney injury) (HCC) 06/17/2023   Atrial fibrillation (HCC)    Cardiac cirrhosis    a. possible elevated LFTs/low platelets felt due to cardiac cirrhosis per 2017 admission.   Chronic combined systolic and diastolic CHF (congestive heart failure) (HCC)    Chronic kidney disease, stage 4 (severe) (HCC) 01/04/2016   Lab Results      Component    Value    Date/Time           GFR    29.24 (L)    09/30/2022 02:51 PM           GFR    27.27 (L)    09/24/2022 03:40 PM  GFR    24.10 (L)    09/03/2022 03:45 PM           GFR    18.98 (L)    08/25/2022 12:21 PM           GFR    38.98 (L)    04/24/2022 02:39 PM   Seeing [facility]ist: Jerrye Katheryn BROCKS, MD  Lastt plan 04/2023: # CKD stage IV    CKD (chronic kidney disease) stage 3, GFR 30-59 ml/min (HCC) 01/04/2016   Lab Results  Component  Value  Date/Time     GFR  29.24 (L)  09/30/2022 02:51 PM     GFR  27.27 (L)  09/24/2022 03:40 PM     GFR  24.10 (L)  09/03/2022 03:45 PM     GFR  18.98 (L)  08/25/2022 12:21 PM     GFR  38.98 (L)   04/24/2022 02:39 PM         CKD (chronic kidney disease), stage II    Stage 4   Congenital heart defect    a. rightward rotation of heart, almost dextrocardia   Coronary artery disease    a. s/p CABGx1 in 12/2015.   Dyspnea    with exertion   Gastric polyps 11/25/2022   Gastritis and gastroduodenitis 11/25/2022          Gastroesophageal reflux disease with esophagitis and hemorrhage 04/04/2021   Hematuria    a. Chronic hx of this, no prior etiology determined through workup per patient.   History of umbilical hernia repair 07/10/2011   History of hernia surgery twice prior   Hypercholesteremia    a. Prev taken off statin due to abnormal liver function.   Hypertension    Incisional hernia 07/10/2011   History of hernia surgery twice prior   Morbid obesity (HCC)    Morbid obesity with BMI of 50.0-59.9, adult (HCC) 01/04/2016   After [facility] doctor is setting him up with a wellness clinic to help with weight losS  He reports not having tried anything for weight loss either diet or or medicine or surgery in the past  We failed to get Wegovy  10/2022      Wt Readings from Last 10 Encounters:  11/13/22  (!) 384 lb 12.8 oz (174.5 kg)  10/13/22  (!) 385 lb (174.6 kg)  09/30/22  (!) 384 lb (174.2 kg)  09/24/22  (!) 382 lb (1   Nausea and vomiting 11/25/2022   OSA on CPAP    Paroxysmal atrial flutter (HCC) 02/12/2016   PONV (postoperative nausea and vomiting)    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 OR;  Service: Vascular;  Laterality: Left;   BIOPSY  02/19/2021   Procedure: BIOPSY;  Surgeon: Eda Iha, MD;  Location: [facility] ENDOSCOPY;  Service: Gastroenterology;;   BIOPSY  11/25/2022   Procedure: BIOPSY;  Surgeon:  Eda Iha, MD;  Location: [facility];  Service: Gastroenterology;;   CARDIAC CATHETERIZATION N/A 12/24/2015   Procedure: Right/Left Heart Cath and Coronary Angiography;  Surgeon: Toribio JONELLE Fuel, MD;  Location: [facility] INVASIVE CV LAB;  Service: Cardiovascular;  Laterality: N/A;   CARDIOVERSION N/A 02/14/2016   Procedure: CARDIOVERSION;  Surgeon: Vinie JAYSON Maxcy, MD;  Location: [facility] ENDOSCOPY;  Service: Cardiovascular;  Laterality: N/A;   COLONOSCOPY WITH PROPOFOL  N/A 02/19/2021   Procedure: COLONOSCOPY WITH PROPOFOL ;  Surgeon: Eda Iha, MD;  Location: [facility]OSCOPY;  Service: Gastroenterology;  Laterality: N/A;   CORONARY ARTERY BYPASS GRAFT N/A 12/26/2015   Procedure: Off Pump Coronary Artery Bypass Grafting times one using left internal mammary artery;  Surgeon: Sudie VEAR Laine, MD;  Location: [facility] OR;  Service: Open Heart Surgery;  Laterality: N/A;   ESOPHAGOGASTRODUODENOSCOPY (EGD) WITH PROPOFOL  N/A 02/19/2021   Procedure: ESOPHAGOGASTRODUODENOSCOPY (EGD) WITH PROPOFOL ;  Surgeon: Eda Iha, MD;  Location: [facility];  Service: Gastroenterology;  Laterality: N/A;   ESOPHAGOGASTRODUODENOSCOPY (EGD) WITH PROPOFOL  N/A 11/25/2022   Procedure: ESOPHAGOGASTRODUODENOSCOPY (EGD) WITH PROPOFOL ;  Surgeon: Eda Iha, MD;  Location: [facility]OS[facility];  Service: Gastroenterology;  Laterality: N/A;   FISTULA SUPERFICIALIZATION Left 05/06/2024   Procedure: FISTULA SUPERFICIALIZATION;  Surgeon: Sheree Penne Bruckner, MD;  Location: [facility] OR;  Service: Vascular;  Laterality: Left;   GALLBLADDER SURGERY     HERNIA REPAIR     INSERTION OF DIALYSIS CATHETER Right 01/11/2024   Procedure: INSERTION OF TUNNELED  DIALYSIS CATHETER RIGHT INTERNAL JUGULAR;   Surgeon: Lanis Fonda BRAVO, MD;  Location: [facility] OR;  Service: Vascular;  Laterality: Right;   IR TRANSCATHETER BX  06/26/2023   IR US  GUIDE VASC ACCESS RIGHT  06/26/2023   IR VENOGRAM HEPATIC W HEMODYNAMIC EVALUATION  06/26/2023   LEFT HEART CATH AND CORS/GRAFTS ANGIOGRAPHY N/A 04/15/2017   Procedure: Left Heart Cath and Cors/Grafts Angiography;  Surgeon: Burnard Debby LABOR, MD;  Location: [facility] INVASIVE CV LAB;  Service: Cardiovascular;  Laterality: N/A;   LEFT HEART CATH AND CORS/GRAFTS ANGIOGRAPHY N/A 11/02/2020   Procedure: LEFT HEART CATH AND CORS/GRAFTS ANGIOGRAPHY;  Surgeon: Wonda Sharper, MD;  Location: [facility] INVASIVE CV LAB;  Service: Cardiovascular;  Laterality: N/A;   LIGATION OF COMPETING BRANCHES OF ARTERIOVENOUS FISTULA Left 05/06/2024   Procedure: LIGATION OF COMPETING BRANCHES OF ARTERIOVENOUS FISTULA;  Surgeon: Sheree Penne Bruckner, MD;  Location: [facility] OR;  Service: Vascular;  Laterality: Left;   POLYPECTOMY  02/19/2021   Procedure: POLYPECTOMY;  Surgeon: Eda Iha, MD;  Location: [facility]OSCOPY;  Service: Gastroenterology;;   TEE WITHOUT CARDIOVERSION N/A 12/26/2015   Procedure: TRANSESOPHAGEAL ECHOCARDIOGRAM (TEE);  Surgeon: Sudie VEAR Laine, MD;  Location: [facility] OR;  Service: Open Heart Surgery;  Laterality: N/A;   TEE WITHOUT CARDIOVERSION N/A 02/14/2016   Procedure: TRANSESOPHAGEAL ECHOCARDIOGRAM (TEE);  Surgeon: Vinie JAYSON Maxcy, MD;  Location: [facility] ENDOSCOPY;  Service: Cardiovascular;  Laterality: N/A;    Family History  Problem Relation Age of Onset   CAD Father        4 stents ~ 54   Diabetes Father    Diabetes Maternal Grandfather    Diabetes Maternal Uncle    Heart attack Other    Hyperlipidemia Other     Allergies  Allergen Reactions   Glucophage  [Metformin ] Other (See Comments)    Pt told by his nephrologist not to take this medication due to his kidney function.   Vibra -Tab [Doxycycline ] Shortness Of Breath   Chlorhexidine  Itching   Chlorhexidine  Gluconate Itching and  Other (See Comments)  burning   Augmentin [Amoxicillin-Pot Clavulanate] Itching   Imdur  [Isosorbide  Dinitrate] Diarrhea   Tape Rash   Valacyclovir  Itching    Current Medications:   Current Outpatient Medications:    allopurinol  (ZYLOPRIM ) 100 MG tablet, TAKE 1 TABLET BY MOUTH EVERY DAY, Disp: 90 tablet, Rfl: 3   amitriptyline  (ELAVIL ) 25 MG tablet, TAKE 1 TABLET BY MOUTH EVERYDAY AT BEDTIME, Disp: 30 tablet, Rfl: 0   azelastine  (ASTELIN ) 0.1 % nasal spray, Place 2 sprays into both nostrils 2 (two) times daily. Use in each nostril as directed, Disp: 30 mL, Rfl: 12   B Complex-C-Folic Acid  (DIALYVITE  TABLET) TABS, Take 1 tablet by mouth daily., Disp: , Rfl:    cefdinir  (OMNICEF ) 300 MG capsule, Take 1 capsule (300 mg total) by mouth daily for 3 doses. Take after dialysis on dialysis days, Disp: 3 capsule, Rfl: 0   Cyanocobalamin  (VITAMIN B-12) 5000 MCG SUBL, Place 5,000 mcg under the tongue daily., Disp: , Rfl:    ELIQUIS  5 MG TABS tablet, TAKE 1 TABLET(5 MG) BY MOUTH TWICE DAILY, Disp: 180 tablet, Rfl: 1   Evolocumab  (REPATHA ) 140 MG/ML SOSY, Inject 140 mg into the skin every 14 (fourteen) days., Disp: , Rfl:    folic acid -vitamin b complex-vitamin c-selenium-zinc (DIALYVITE ) 3 MG TABS tablet, Take 1 tablet by mouth daily., Disp: , Rfl:    lanthanum  (FOSRENOL ) 1000 MG chewable tablet, Chew 1,000 mg by mouth 3 (three) times daily with meals., Disp: , Rfl:    Methoxy PEG-Epoetin Beta (MIRCERA IJ), 75 mcg., Disp: , Rfl:    montelukast  (SINGULAIR ) 10 MG tablet, TAKE 1 TABLET BY MOUTH EVERY DAY, Disp: 90 tablet, Rfl: 1   mupirocin  ointment (BACTROBAN ) 2 %, Apply to inner nares twice daily, Disp: 22 g, Rfl: 0   nitroGLYCERIN  (NITROSTAT ) 0.4 MG SL tablet, Place 1 tablet (0.4 mg total) under the tongue every 5 (five) minutes as needed for chest pain., Disp: 25 tablet, Rfl: 6   ondansetron  (ZOFRAN -ODT) 4 MG disintegrating tablet, Take 1 tablet (4 mg total) by mouth every 8 (eight) hours as needed  for nausea or vomiting., Disp: 30 tablet, Rfl: 2   oxyCODONE -acetaminophen  (PERCOCET) 5-325 MG tablet, Take 1 tablet by mouth every 6 (six) hours as needed for severe pain (pain score 7-10)., Disp: 8 tablet, Rfl: 0   promethazine  (PHENERGAN ) 25 MG tablet, Take 1 tablet (25 mg total) by mouth every 8 (eight) hours as needed for nausea or vomiting., Disp: 20 tablet, Rfl: 0   rifaximin  (XIFAXAN ) 550 MG TABS tablet, Take 1 tablet (550 mg total) by mouth 2 (two) times daily., Disp: 60 tablet, Rfl: 0   torsemide  (DEMADEX ) 10 MG tablet, Take 10 mg by mouth daily., Disp: , Rfl:    Vonoprazan Fumarate  (VOQUEZNA ) 20 MG TABS, Take by mouth., Disp: , Rfl:    diltiazem  (CARDIZEM  SR) 120 MG 12 hr capsule, Take 1 capsule (120 mg total) by mouth 2 (two) times daily., Disp: , Rfl:    lactulose  (CHRONULAC ) 10 GM/15ML solution, Take 45 mLs (30 g total) by mouth 4 (four) times daily as needed (to acheive goal of 2-3 BMs daily). Titrate based on the stool frequency., Disp: 946 mL, Rfl: 1   losartan  (COZAAR ) 50 MG tablet, Take 1 tablet (50 mg total) by mouth daily., Disp: , Rfl:    polyethylene glycol (MIRALAX  / GLYCOLAX ) 17 g packet, Take 17 g by mouth daily as needed. Titrate based on the stool frequency. Goal :2-3 BMs a day,, Disp: , Rfl:  No current facility-administered medications for this visit.  Facility-Administered Medications Ordered in Other Visits:    technetium tetrofosmin  (TC-MYOVIEW ) injection 29.8 millicurie, 29.8 millicurie, Intravenous, Once PRN, Hilty, Vinie BROCKS, MD   Review of Systems:   Negative unless otherwise specified per HPI.  Vitals:   Vitals:   08/09/24 1046  BP: (!) 120/54  Pulse: 76  Temp: 98.4 F (36.9 C)  TempSrc: Temporal  SpO2: 95%  Weight: (!) 361 lb (163.7 kg)  Height: 5' 11 (1.803 m)     Body mass index is 50.35 kg/m.  Physical Exam:   Physical Exam Vitals and nursing note reviewed.  Constitutional:      General: He is not in acute distress.    Appearance: He  is well-developed. He is obese. He is not ill-appearing or toxic-appearing.  HENT:     Head: Normocephalic.     Nose: Nasal tenderness and congestion present.     Right Turbinates: Swollen.     Left Turbinates: Swollen.  Eyes:     General: Scleral icterus present.     Conjunctiva/sclera: Conjunctivae normal.     Pupils: Pupils are equal, round, and reactive to light.  Cardiovascular:     Rate and Rhythm: Normal rate and regular rhythm.     Pulses: Normal pulses.     Heart sounds: Normal heart sounds, S1 normal and S2 normal.  Pulmonary:     Effort: Pulmonary effort is normal.     Breath sounds: Normal breath sounds.  Musculoskeletal:        General: Normal range of motion.     Cervical back: Normal range of motion.  Skin:    General: Skin is warm and dry.  Neurological:     Mental Status: He is alert and oriented to person, place, and time.     GCS: GCS eye subscore is 4. GCS verbal subscore is 5. GCS motor subscore is 6.  Psychiatric:        Speech: Speech normal.        Behavior: Behavior normal. Behavior is cooperative.        Thought Content: Thought content normal.        Judgment: Judgment normal.     Assessment and Plan:   Assessment and Plan Assessment & Plan Hepatic encephalopathy Confusion from elevated ammonia levels due to chronic liver disease. Treated with lactulose  to reduce ammonia. Symptom(s) improving. -  Reviewed plan of care with PCP -- he requests patient to continue on Lactulose  (as opposed to miralax ) for chronic management of his ammonia build-up. Will send in for patient. - Start xifaxin 550 mg twice daily for at least 3 months after this flare  End-stage renal disease on hemodialysis On hemodialysis with no missed sessions. Dialysis performed in the hospital. - Continue close follow up with nephrology team and compliance  Pyuria Diagnosed with urinary tract infection in hospital; received keflex  and prescribed cefdinir  post-dialysis x 3  capsules. No urine culture obtained. - Complete cefdinir  course, taking one dose after each dialysis session. - Obtain urine sample for culture to ensure UTI resolution.  Atrial fibrillation Diltiazem  paused due to low blood pressure. Important for atrial fibrillation control. - Restart diltiazem  120 mg twice daily and losartan  50 mg daily per renal teams recommendations - if any dizziness, lightheadedness or concern for hypotension, I have asked him to reach out to his cardiology or nephrology team for appropriate adjustments  Nasal pain Nasal dryness and occasional epistaxis. - Use saline nasal spray to moisten  nasal passages and reduce dryness. - Apply thin layer of mupirocin  ointment to nares up to twice daily as needed  - follow up with Primary Care Provider (PCP) if symptom(s) persist/worsen  I personally spent a total of 72 minutes in the care of the patient today including preparing to see the patient, getting/reviewing separately obtained history, performing a medically appropriate exam/evaluation, counseling and educating, placing orders, documenting clinical information in the EHR, and communicating results.   Lucie Buttner, PA-C

## 2024-08-09 NOTE — Telephone Encounter (Signed)
 CRITICAL VALUE STICKER  CRITICAL VALUE: Creatinine 9.15                                 GFR 6.11  RECEIVER (on-site recipient of call): Waddell Salles  DATE & TIME NOTIFIED: 08/09/2024 3:01pm  MESSENGER (representative from lab): [id number]  MD NOTIFIED:  Garnette Lukes MD  TIME OF NOTIFICATION: 3:10pm  RESPONSE: Known Dialysis Patient.

## 2024-08-09 NOTE — Telephone Encounter (Signed)
 Agree-known dialysis patient-forward to PCP for tomorrow

## 2024-08-09 NOTE — Telephone Encounter (Signed)
 Dr. Jesus, pt does not want to take the Lactulose  due to hemorrhoids are flaring up, some rectal bleeding and rectum is raw from going to the bathroom. Pt said he is going 3 times a day, stool is soft and formed. Please advise.

## 2024-08-09 NOTE — Telephone Encounter (Signed)
 Spoke to pt told him per Lucie,  I reviewed the plan of care with his PCP, Dr Jesus.   He prefers for patient to take Lactulose  to help prevent ammonia build-up, instead of Miralax  as we previously discussed in our visit.   Please let patient know that I have sent in Lactulose  for him to take in order to have 2-3 soft bowel movements daily. He does not need to take miralax  at this time. Pt verbalized understanding and said the problem he has with the medication is that he has hemorrhoids and rectal area is raw and he is having some rectal bleeding. Asked pt is he having diarrhea and how often are you going? Pt said no diarrhea, stool is formed and soft, going 3 times a day. Told him to continue Lactulose  for now and I will send a message to Dr. Jesus, he is out of the office till tomorrow morning and we will get back to you. Pt verbalized understanding.

## 2024-08-09 NOTE — Telephone Encounter (Signed)
 Please call patient.  I reviewed the plan of care with his PCP, Dr Jesus.  He prefers for patient to take Lactulose  to help prevent ammonia build-up, instead of Miralax  as we previously discussed in our visit.  Please let patient know that I have sent in Lactulose  for him to take in order to have 2-3 soft bowel movements daily. He does not need to take miralax  at this time.  Joleena Weisenburger

## 2024-08-10 ENCOUNTER — Telehealth: Payer: Self-pay

## 2024-08-10 ENCOUNTER — Ambulatory Visit: Payer: Self-pay | Admitting: Internal Medicine

## 2024-08-10 ENCOUNTER — Other Ambulatory Visit (HOSPITAL_COMMUNITY): Payer: Self-pay

## 2024-08-10 DIAGNOSIS — L299 Pruritus, unspecified: Secondary | ICD-10-CM | POA: Diagnosis not present

## 2024-08-10 DIAGNOSIS — N186 End stage renal disease: Secondary | ICD-10-CM | POA: Diagnosis not present

## 2024-08-10 DIAGNOSIS — Z992 Dependence on renal dialysis: Secondary | ICD-10-CM | POA: Diagnosis not present

## 2024-08-10 DIAGNOSIS — R52 Pain, unspecified: Secondary | ICD-10-CM | POA: Diagnosis not present

## 2024-08-10 DIAGNOSIS — N2581 Secondary hyperparathyroidism of renal origin: Secondary | ICD-10-CM | POA: Diagnosis not present

## 2024-08-10 LAB — URINE CULTURE
MICRO NUMBER:: 16942984
SPECIMEN QUALITY:: ADEQUATE

## 2024-08-10 NOTE — Telephone Encounter (Signed)
 Pharmacy Patient Advocate Encounter   Received notification from Onbase that prior authorization for Xifaxan  550MG  tablets is required/requested.   Insurance verification completed.   The patient is insured through [facility] .   Per test claim: PA required; PA submitted to above mentioned insurance via Latent Key/confirmation #/EOC  [id number] Status is pending

## 2024-08-10 NOTE — Telephone Encounter (Signed)
 Spoke to pt told him per Dr. Jesus, Inform the patient that not taking lactulose  will leave him at high risk for return to hospital with build of ammonia. See if he can at least tolerate some. Pt verbalized understanding. Told pt he can get OTC medication for rectal area one is called Butt Paste, like the use on baby's for diaper rash and I would just spread it on area to help heal. Pt verbalized understanding.

## 2024-08-10 NOTE — Progress Notes (Signed)
 One of your recent blood cultures showed a bacteria called Cutibacterium acnes. This organism is usually found on the skin and is often considered a contaminant rather than a true infection, especially since it appeared in only one out of four cultures. At this time, there is no evidence to suggest a serious infection, and no specific antibiotic treatment is needed. We will continue to monitor your health closely. If you develop any new symptoms such as fever, chills, or feeling worse, please let us  know right away

## 2024-08-10 NOTE — Discharge Summary (Signed)
 Physician Discharge Summary  Sean [name] FMW:[id number] DOB: 09/22/1971 DOA: 08/02/2024  PCP: Jesus Bernardino MATSU, MD  Admit date: 08/02/2024 Discharge date: 08/06/2024  Admitted From: Home Disposition: Home  Recommendations for Outpatient Follow-up:  Follow up with PCP in 1 week with repeat CBC/CMP. F/u with hepatologist Follow up in ED if symptoms worsen or new appear   Home Health: No Equipment/Devices: None  Discharge Condition: Stable CODE STATUS: Full Diet recommendation: Heart healthy, low salt  Brief/Interim Summary:  53 year old male with history of ESRD on hemodialysis, liver cirrhosis, obesity, atrial fibrillation is admitted with metabolic encephalopathy likely hepatic encephalopathy with possible urinary tract infection.     Assessment and Plan: * Acute hepatic encephalopathy [facility]) Patient presented for evaluation of altered mental status.  Has a known history of liver cirrhosis and elevated ammonia level of 150 on admission. Treated with rifaximin  and lactulose  as well as MiraLAX . Patient is discharged on lactulose  20mg  tid, need to titrate to ensure 3 bowel movements a day. Mental status returned to baseline Patient need to follow up with hepatologist/GI in 1-2 weeks   Decompensated cirrhosis (HCC) Continue metoprolol , lactulose  and rifaximin  Placed patient empirically on antibiotic therapy for uti/SBP.   UTI (urinary tract infection) Patient noted to have significant pyuria Treated with empiric antibiotic therapy with Rocephin   I do not see the reports of urine culture.  Will complete 5 to 7 days of therapy/omnicef  on discharge   Atrial fibrillation (HCC) Paroxysmal Continue metoprolol  and diltiazem  for rate control Continue apixaban  as primary prophylaxis for an acute stroke     Chronic diastolic CHF (congestive heart failure) (HCC) Not acutely exacerbated Last known LVEF, 2D echocardiogram which was done 05/24 showed an LVEF of 50 to 55% Continue  torsemide , metoprolol  and losartan    ESRD (end stage renal disease) (HCC) Dialysis on Monday/Wednesday/Friday Nephrology on board   Gout Continue allopurinol    Morbid obesity with BMI of 50.0-59.9, adult (HCC) BMI 51.35 Complicates overall prognosis and plan of care Lifestyle modification and exercise has been discussed with patient in detail   Coronary artery disease involving native coronary artery of native heart without angina pectoris Status post CABG Continue metoprolol    Hypertension Continue metoprolol , losartan  and diltiazem    Chronic anemia- multifactorial  Discharged in stable condition to home  Discharge Diagnoses:  Principal Problem:   Acute hepatic encephalopathy (HCC) Active Problems:   Decompensated cirrhosis (HCC)   UTI (urinary tract infection)   Atrial fibrillation (HCC)   Hypertension   Coronary artery disease involving native coronary 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  Continue to take lactulose  30 g 4 times a day.  Titrate down based on number of bowel movements per day.  Goal of 2-3 bowel movements per day. 2.  Hold off on losartan  and Cardizem  due to relatively low blood pressures.  Follow-up with your PCP within a week for reevaluation. 3.  Follow-up with your liver specialist as an outpatient. 4.  Complete a course of antibiotics with Ceftin  80 mg daily for 3 more days.   Increase activity slowly   Complete by: As directed  No wound care   Complete by: As directed       Allergies as of 08/06/2024       Reactions   Glucophage  [metformin ] Other (See Comments)   Pt told by his nephrologist not to  take this medication due to his kidney function.   Vibra -tab [doxycycline ] Shortness Of Breath   Chlorhexidine  Itching   Chlorhexidine  Gluconate Itching, Other (See Comments)   burning   Augmentin [amoxicillin-pot Clavulanate] Itching   Imdur  [isosorbide  Dinitrate] Diarrhea   Tape Rash   Valacyclovir  Itching        Medication List     STOP taking these medications    cephALEXin  500 MG capsule Commonly known as: KEFLEX    sulfamethoxazole -trimethoprim  800-160 MG tablet Commonly known as: Bactrim  DS       TAKE these medications    allopurinol  100 MG tablet Commonly known as: ZYLOPRIM  TAKE 1 TABLET BY MOUTH EVERY DAY   amitriptyline  25 MG tablet Commonly known as: ELAVIL  TAKE 1 TABLET BY MOUTH EVERYDAY AT BEDTIME   azelastine  0.1 % nasal spray Commonly known as: ASTELIN  Place 2 sprays into both nostrils 2 (two) times daily. Use in each nostril as directed   DIALYVITE  TABLET Tabs Take 1 tablet by mouth daily.   Eliquis  5 MG Tabs tablet Generic drug: apixaban  TAKE 1 TABLET(5 MG) BY MOUTH TWICE DAILY   folic acid -vitamin b complex-vitamin c-selenium-zinc 3 MG Tabs tablet Take 1 tablet by mouth daily.   lanthanum  1000 MG chewable tablet Commonly known as: FOSRENOL  Chew 1,000 mg by mouth 3 (three) times daily with meals.   MIRCERA IJ 75 mcg.   montelukast  10 MG tablet Commonly known as: SINGULAIR  TAKE 1 TABLET BY MOUTH EVERY DAY   nitroGLYCERIN  0.4 MG SL tablet Commonly known as: Nitrostat  Place 1 tablet (0.4 mg total) under the tongue every 5 (five) minutes as needed for chest pain.   ondansetron  4 MG disintegrating tablet Commonly known as: ZOFRAN -ODT Take 1 tablet (4 mg total) by mouth every 8 (eight) hours as needed for nausea or vomiting.   oxyCODONE -acetaminophen  5-325 MG tablet Commonly known as: Percocet Take 1 tablet by mouth every 6 (six) hours as needed for severe pain (pain score 7-10).   promethazine  25 MG tablet Commonly known as:  PHENERGAN  Take 1 tablet (25 mg total) by mouth every 8 (eight) hours as needed for nausea or vomiting.   Repatha  140 MG/ML Sosy Generic drug: Evolocumab  Inject 140 mg into the skin every 14 (fourteen) days.   torsemide  10 MG tablet Commonly known as: DEMADEX  Take 10 mg by mouth daily.   Vitamin B-12 5000 MCG Subl Place 5,000 mcg under the tongue daily.   Voquezna  20 MG Tabs Generic drug: Vonoprazan Fumarate  Take by mouth.       ASK your doctor about these medications    cefdinir  300 MG capsule Commonly known as: OMNICEF  Take 1 capsule (300 mg total) by mouth daily for 3 doses. Take after dialysis on dialysis days Ask about: Should I take this medication?        Follow-up Information     Jesus Bernardino MATSU, MD Follow up in 1 week(s).   Specialty: Internal Medicine Contact information: [address] [place] [address] [phone]         Hays Morrison, CRNP. Schedule an appointment as soon as possible for a visit in 1 week(s).   Specialty: Nurse Practitioner Contact information: [address] [place] [address] [phone]  Allergies  Allergen Reactions   Glucophage  [Metformin ] Other (See Comments)    Pt told by his nephrologist not to take this medication due to his kidney function.   Vibra -Tab [Doxycycline ] Shortness Of Breath   Chlorhexidine  Itching   Chlorhexidine  Gluconate Itching and Other (See Comments)    burning   Augmentin [Amoxicillin-Pot Clavulanate] Itching   Imdur  [Isosorbide  Dinitrate] Diarrhea   Tape Rash   Valacyclovir  Itching    Consultations:    Procedures/Studies: US  Abdomen Complete Result Date: 08/04/2024 CLINICAL DATA:  Encephalopathy.  Cirrhosis of liver. EXAM: ABDOMEN ULTRASOUND COMPLETE COMPARISON:  Right upper quadrant ultrasound 07/14/2024 FINDINGS: Gallbladder: Surgically absent. Common bile duct: Diameter: 6.8 mm Liver: No focal lesion identified. Decrease in parenchymal  echogenicity. Portal vein is patent on color Doppler imaging with normal direction of blood flow towards the liver. IVC: No abnormality visualized.  Limited evaluation. Pancreas: Not well seen secondary to overlying bowel gas. Spleen: Size and appearance within normal limits.  10.5 cm. Right Kidney: Length: 8.1 cm. Limited evaluation. Echogenicity within normal limits. No mass or hydronephrosis visualized. Left Kidney: Length: 7.5 cm. Limited evaluation. Echogenicity within normal limits. No mass or hydronephrosis visualized. Abdominal aorta: No aneurysm visualized.  1.6 cm. Other findings: None. IMPRESSION: 1. Decrease in parenchymal echogenicity of the liver. This is a nonspecific finding but can be seen in the setting of hepatitis. 2. Status post cholecystectomy. 3. Limited evaluation of the pancreas and kidneys. Electronically Signed   By: Greig Pique M.D.   On: 08/04/2024 22:00   CT Head Wo Contrast Result Date: 08/03/2024 EXAM: CT HEAD WITHOUT CONTRAST 08/03/2024 12:54:53 AM TECHNIQUE: CT of the head was performed without the administration of intravenous contrast. Automated exposure control, iterative reconstruction, and/or weight based adjustment of the mA/kV was utilized to reduce the radiation dose to as low as reasonably achievable. COMPARISON: None available. CLINICAL HISTORY: Mental status change, unknown cause. AMS. FINDINGS: BRAIN AND VENTRICLES: No acute hemorrhage. No evidence of acute infarct. No hydrocephalus. No extra-axial collection. No mass effect or midline shift. ORBITS: No acute abnormality. SINUSES: No acute abnormality. SOFT TISSUES AND SKULL: No acute soft tissue abnormality. No skull fracture. IMPRESSION: 1. No acute intracranial abnormality. Electronically signed by: Franky Stanford MD 08/03/2024 12:58 AM EDT RP Workstation: [id number]   US  Abdomen Limited RUQ (LIVER/GB) Result Date: 07/18/2024 CLINICAL DATA:  MASH.  Advanced fibrosis. EXAM: ULTRASOUND ABDOMEN LIMITED RIGHT UPPER  QUADRANT COMPARISON:  12/24/2023 FINDINGS: Gallbladder: Status post cholecystectomy Common bile duct: Diameter: 8 mm Liver: Parenchymal echogenicity: Diffusely increased Contours: Normal Lesions: None Portal vein: Patent.  Hepatopetal flow Other: None. IMPRESSION: Diffuse increased echogenicity of the hepatic parenchyma is a nonspecific indicator of hepatocellular dysfunction, most commonly steatosis. Electronically Signed   By: Aliene Lloyd M.D.   On: 07/18/2024 09:11      Subjective:   Discharge Exam: Vitals:   08/06/24 0858 08/06/24 0900  BP: (!) 89/43 (!) 89/43  Pulse:  84  Resp:    Temp:    SpO2:      General: Pt is alert, awake, not in acute distress Cardiovascular: rate controlled, S1/S2 + Respiratory: bilateral decreased breath sounds at bases Abdominal: Soft, NT, ND, bowel sounds + Extremities: no edema, no cyanosis    The results of significant diagnostics from this hospitalization (including imaging, microbiology, ancillary and laboratory) are listed below for reference.     Microbiology: Recent Results (from the past 240 hours)  Culture, blood (routine x 2)     Status: Abnormal  Collection Time: 08/03/24 12:50 AM   Specimen: BLOOD  Result Value Ref Range Status   Specimen Description BLOOD RIGHT ANTECUBITAL  Final   Special Requests   Final    BOTTLES DRAWN AEROBIC AND ANAEROBIC Blood Culture adequate volume   Culture  Setup Time GRAM POSITIVE RODS ANAEROBIC BOTTLE ONLY   Final   Culture (A)  Final    CUTIBACTERIUM ACNES Standardized susceptibility testing for this organism is not available. Performed at [facility], [address] [place], [place] [address]    Report Status 08/08/2024 FINAL  Final  Culture, blood (routine x 2)     Status: None   Collection Time: 08/03/24  1:00 AM   Specimen: BLOOD  Result Value Ref Range Status   Specimen Description BLOOD RIGHT ANTECUBITAL  Final   Special Requests   Final    BOTTLES DRAWN AEROBIC AND  ANAEROBIC Blood Culture adequate volume   Culture   Final    NO GROWTH 5 DAYS Performed at [facility], [address] [place], [place] [address]    Report Status 08/08/2024 FINAL  Final     Labs: BNP (last 3 results) Recent Labs    11/02/23 1336 01/05/24 1238  BNP 25.4 430.6*   Basic Metabolic Panel: Recent Labs  Lab 08/04/24 0148 08/05/24 1455 08/09/24 1139  NA 135 136 139  K 3.2* 3.5 3.7  CL 97* 98 98  CO2 22 21* 27  GLUCOSE 167* 115* 93  BUN 34* 63* 50*  CREATININE 5.57* 9.96* 9.15*  CALCIUM  8.9 8.3* 9.1  PHOS  --  8.4*  --    Liver Function Tests: Recent Labs  Lab 08/05/24 1455 08/09/24 1139  AST  --  62*  ALT  --  57*  ALKPHOS  --  322*  BILITOT  --  5.5*  PROT  --  6.5  ALBUMIN  2.6* 3.2*   No results for input(s): LIPASE, AMYLASE in the last 168 hours. No results for input(s): AMMONIA in the last 168 hours. CBC: Recent Labs  Lab 08/04/24 0148 08/05/24 1200 08/09/24 1139  WBC 8.8 7.0 5.3  NEUTROABS  --   --  2.7  HGB 10.0* 9.4* 9.9*  HCT 30.1* 29.5* 29.2*  MCV 106.4* 109.7* 105.9*  PLT 122* 79* 51.0*   Cardiac Enzymes: No results for input(s): CKTOTAL, CKMB, CKMBINDEX, TROPONINI in the last 168 hours. BNP: Invalid input(s): POCBNP CBG: No results for input(s): GLUCAP in the last 168 hours. D-Dimer No results for input(s): DDIMER in the last 72 hours. Hgb A1c No results for input(s): HGBA1C in the last 72 hours. Lipid Profile No results for input(s): CHOL, HDL, LDLCALC, TRIG, CHOLHDL, LDLDIRECT in the last 72 hours. Thyroid  function studies No results for input(s): TSH, T4TOTAL, T3FREE, THYROIDAB in the last 72 hours.  Invalid input(s): FREET3 Anemia work up No results for input(s): VITAMINB12, FOLATE, FERRITIN, TIBC, IRON, RETICCTPCT in the last 72 hours. Urinalysis    Component Value Date/Time   COLORURINE Dark Yellow (A) 08/09/2024 1139   APPEARANCEUR SL CLOUDY (A)  08/09/2024 1139   LABSPEC >=1.030 (A) 08/09/2024 1139   PHURINE 5.0 08/09/2024 1139   GLUCOSEU 100 (A) 08/09/2024 1139   HGBUR TRACE-LYSED (A) 08/09/2024 1139   BILIRUBINUR MODERATE (A) 08/09/2024 1139   BILIRUBINUR negative 09/03/2022 1605   KETONESUR TRACE (A) 08/09/2024 1139   PROTEINUR 100 (A) 08/02/2024 2340   UROBILINOGEN 4.0 (A) 08/09/2024 1139   NITRITE NEGATIVE 08/09/2024 1139   LEUKOCYTESUR NEGATIVE  08/09/2024 1139   Sepsis Labs Recent Labs  Lab 08/04/24 0148 08/05/24 1200 08/09/24 1139  WBC 8.8 7.0 5.3   Microbiology Recent Results (from the past 240 hours)  Culture, blood (routine x 2)     Status: Abnormal   Collection Time: 08/03/24 12:50 AM   Specimen: BLOOD  Result Value Ref Range Status   Specimen Description BLOOD RIGHT ANTECUBITAL  Final   Special Requests   Final    BOTTLES DRAWN AEROBIC AND ANAEROBIC Blood Culture adequate volume   Culture  Setup Time GRAM POSITIVE RODS ANAEROBIC BOTTLE ONLY   Final   Culture (A)  Final    CUTIBACTERIUM ACNES Standardized susceptibility testing for this organism is not available. Performed at [facility], [address] [place], [place] [address]    Report Status 08/08/2024 FINAL  Final  Culture, blood (routine x 2)     Status: None   Collection Time: 08/03/24  1:00 AM   Specimen: BLOOD  Result Value Ref Range Status   Specimen Description BLOOD RIGHT ANTECUBITAL  Final   Special Requests   Final    BOTTLES DRAWN AEROBIC AND ANAEROBIC Blood Culture adequate volume   Culture   Final    NO GROWTH 5 DAYS Performed at [facility], [address], [place], [place] [address]    Report Status 08/08/2024 FINAL  Final     Time coordinating discharge: 35 minutes  SIGNED:   Derryl Duval, MD  [facility] 08/10/2024, 7:09 PM

## 2024-08-10 NOTE — Telephone Encounter (Signed)
 Spoke with pt  via phone about medication he can place in nostril area pt understood.

## 2024-08-11 ENCOUNTER — Ambulatory Visit: Payer: Self-pay | Admitting: Physician Assistant

## 2024-08-11 ENCOUNTER — Other Ambulatory Visit (HOSPITAL_COMMUNITY): Payer: Self-pay

## 2024-08-11 DIAGNOSIS — G4733 Obstructive sleep apnea (adult) (pediatric): Secondary | ICD-10-CM | POA: Diagnosis not present

## 2024-08-11 DIAGNOSIS — J4520 Mild intermittent asthma, uncomplicated: Secondary | ICD-10-CM | POA: Diagnosis not present

## 2024-08-11 DIAGNOSIS — J301 Allergic rhinitis due to pollen: Secondary | ICD-10-CM | POA: Diagnosis not present

## 2024-08-11 NOTE — Telephone Encounter (Signed)
 Pharmacy Patient Advocate Encounter  Received notification from [facility] that Prior Authorization for Xifaxan  550 MG Tabs has been APPROVED from 08/10/24 to 08/10/25   PA #/Case ID/Reference #: [id number]

## 2024-08-12 ENCOUNTER — Telehealth: Payer: Self-pay

## 2024-08-12 ENCOUNTER — Other Ambulatory Visit: Payer: Self-pay

## 2024-08-12 DIAGNOSIS — N186 End stage renal disease: Secondary | ICD-10-CM | POA: Diagnosis not present

## 2024-08-12 DIAGNOSIS — R52 Pain, unspecified: Secondary | ICD-10-CM | POA: Diagnosis not present

## 2024-08-12 DIAGNOSIS — L299 Pruritus, unspecified: Secondary | ICD-10-CM | POA: Diagnosis not present

## 2024-08-12 DIAGNOSIS — N2581 Secondary hyperparathyroidism of renal origin: Secondary | ICD-10-CM | POA: Diagnosis not present

## 2024-08-12 DIAGNOSIS — K649 Unspecified hemorrhoids: Secondary | ICD-10-CM

## 2024-08-12 DIAGNOSIS — Z992 Dependence on renal dialysis: Secondary | ICD-10-CM | POA: Diagnosis not present

## 2024-08-12 MED ORDER — HYDROCORTISONE ACETATE 25 MG RE SUPP: 25.0000 mg | Freq: Two times a day (BID) | RECTAL | 0 refills | Status: AC

## 2024-08-12 NOTE — Telephone Encounter (Signed)
 Copied from CRM #[id number]. Topic: Clinical - Medical Advice >> Aug 12, 2024 12:50 PM Dedra B wrote: Reason for CRM: Pt has some health concerns that he would like to discuss with provider.  Spoke with pt he has hemorrhoids painful sent in medication for it and placed referral for GI to see pt for pt understood well and will go pick up medication today.

## 2024-08-13 ENCOUNTER — Other Ambulatory Visit: Payer: Self-pay | Admitting: Internal Medicine

## 2024-08-13 DIAGNOSIS — I4892 Unspecified atrial flutter: Secondary | ICD-10-CM

## 2024-08-15 DIAGNOSIS — N2581 Secondary hyperparathyroidism of renal origin: Secondary | ICD-10-CM | POA: Diagnosis not present

## 2024-08-15 DIAGNOSIS — N186 End stage renal disease: Secondary | ICD-10-CM | POA: Diagnosis not present

## 2024-08-15 DIAGNOSIS — Z992 Dependence on renal dialysis: Secondary | ICD-10-CM | POA: Diagnosis not present

## 2024-08-15 DIAGNOSIS — L299 Pruritus, unspecified: Secondary | ICD-10-CM | POA: Diagnosis not present

## 2024-08-15 DIAGNOSIS — R52 Pain, unspecified: Secondary | ICD-10-CM | POA: Diagnosis not present

## 2024-08-15 NOTE — Telephone Encounter (Signed)
 Eliquis  5mg  refill request received. Patient is 54 years old, weight-163.7kg, Crea-9.15 on 08/09/24, Diagnosis-Aflutter, and last seen by Rosaline Bane on 02/01/24. Dose is appropriate based on dosing criteria. Will send in refill to requested pharmacy.

## 2024-08-17 DIAGNOSIS — N2581 Secondary hyperparathyroidism of renal origin: Secondary | ICD-10-CM | POA: Diagnosis not present

## 2024-08-17 DIAGNOSIS — L299 Pruritus, unspecified: Secondary | ICD-10-CM | POA: Diagnosis not present

## 2024-08-17 DIAGNOSIS — Z992 Dependence on renal dialysis: Secondary | ICD-10-CM | POA: Diagnosis not present

## 2024-08-17 DIAGNOSIS — R52 Pain, unspecified: Secondary | ICD-10-CM | POA: Diagnosis not present

## 2024-08-17 DIAGNOSIS — N186 End stage renal disease: Secondary | ICD-10-CM | POA: Diagnosis not present

## 2024-08-19 DIAGNOSIS — R52 Pain, unspecified: Secondary | ICD-10-CM | POA: Diagnosis not present

## 2024-08-19 DIAGNOSIS — N186 End stage renal disease: Secondary | ICD-10-CM | POA: Diagnosis not present

## 2024-08-19 DIAGNOSIS — Z992 Dependence on renal dialysis: Secondary | ICD-10-CM | POA: Diagnosis not present

## 2024-08-19 DIAGNOSIS — L299 Pruritus, unspecified: Secondary | ICD-10-CM | POA: Diagnosis not present

## 2024-08-19 DIAGNOSIS — K7682 Hepatic encephalopathy: Secondary | ICD-10-CM | POA: Diagnosis not present

## 2024-08-19 DIAGNOSIS — N2581 Secondary hyperparathyroidism of renal origin: Secondary | ICD-10-CM | POA: Diagnosis not present

## 2024-08-22 DIAGNOSIS — Z992 Dependence on renal dialysis: Secondary | ICD-10-CM | POA: Diagnosis not present

## 2024-08-22 DIAGNOSIS — L299 Pruritus, unspecified: Secondary | ICD-10-CM | POA: Diagnosis not present

## 2024-08-22 DIAGNOSIS — R52 Pain, unspecified: Secondary | ICD-10-CM | POA: Diagnosis not present

## 2024-08-22 DIAGNOSIS — N2581 Secondary hyperparathyroidism of renal origin: Secondary | ICD-10-CM | POA: Diagnosis not present

## 2024-08-22 DIAGNOSIS — N186 End stage renal disease: Secondary | ICD-10-CM | POA: Diagnosis not present

## 2024-08-24 DIAGNOSIS — N186 End stage renal disease: Secondary | ICD-10-CM | POA: Diagnosis not present

## 2024-08-24 DIAGNOSIS — L299 Pruritus, unspecified: Secondary | ICD-10-CM | POA: Diagnosis not present

## 2024-08-24 DIAGNOSIS — Z992 Dependence on renal dialysis: Secondary | ICD-10-CM | POA: Diagnosis not present

## 2024-08-24 DIAGNOSIS — R52 Pain, unspecified: Secondary | ICD-10-CM | POA: Diagnosis not present

## 2024-08-24 DIAGNOSIS — N2581 Secondary hyperparathyroidism of renal origin: Secondary | ICD-10-CM | POA: Diagnosis not present

## 2024-08-26 DIAGNOSIS — R52 Pain, unspecified: Secondary | ICD-10-CM | POA: Diagnosis not present

## 2024-08-26 DIAGNOSIS — N186 End stage renal disease: Secondary | ICD-10-CM | POA: Diagnosis not present

## 2024-08-26 DIAGNOSIS — L299 Pruritus, unspecified: Secondary | ICD-10-CM | POA: Diagnosis not present

## 2024-08-26 DIAGNOSIS — N2581 Secondary hyperparathyroidism of renal origin: Secondary | ICD-10-CM | POA: Diagnosis not present

## 2024-08-26 DIAGNOSIS — Z992 Dependence on renal dialysis: Secondary | ICD-10-CM | POA: Diagnosis not present

## 2024-08-28 ENCOUNTER — Other Ambulatory Visit: Payer: Self-pay | Admitting: Gastroenterology

## 2024-08-29 ENCOUNTER — Telehealth: Payer: Self-pay | Admitting: *Deleted

## 2024-08-29 DIAGNOSIS — N2581 Secondary hyperparathyroidism of renal origin: Secondary | ICD-10-CM | POA: Diagnosis not present

## 2024-08-29 DIAGNOSIS — N186 End stage renal disease: Secondary | ICD-10-CM | POA: Diagnosis not present

## 2024-08-29 DIAGNOSIS — L299 Pruritus, unspecified: Secondary | ICD-10-CM | POA: Diagnosis not present

## 2024-08-29 DIAGNOSIS — Z992 Dependence on renal dialysis: Secondary | ICD-10-CM | POA: Diagnosis not present

## 2024-08-29 DIAGNOSIS — R52 Pain, unspecified: Secondary | ICD-10-CM | POA: Diagnosis not present

## 2024-08-29 NOTE — Progress Notes (Signed)
 Complex Care Management Note  Care Guide Note 08/29/2024 Name: Sean [name] MRN: [id number] DOB: Apr 16, 1971  Sean [name] is a 53 y.o. year old male who sees Sean Bernardino MATSU, Sean [name] for primary care. I reached out to Antinio Italy Pedraza by phone today to offer complex care management services.  [name] was given information about Complex Care Management services today including:   The Complex Care Management services include support from the care team which includes your Nurse Care Manager, Clinical Social Worker, or Pharmacist.  The Complex Care Management team is here to help remove barriers to the health concerns and goals most important to you. Complex Care Management services are voluntary, and the patient may decline or stop services at any time by request to their care team member.   Complex Care Management Consent Status: Patient agreed to services and verbal consent obtained.   Follow up plan:  Telephone appointment with complex care management team member scheduled for:  09/09/2024  Encounter Outcome:  Patient Scheduled  Thedford Franks, CMA [place]  [facility] Guide Direct Dial : [phone]  Fax: [phone] Website: [email or web address]

## 2024-08-30 ENCOUNTER — Ambulatory Visit: Payer: Self-pay | Attending: Internal Medicine | Admitting: Internal Medicine

## 2024-08-30 VITALS — BP 127/62 | HR 67 | Ht 71.0 in | Wt 362.0 lb

## 2024-08-30 DIAGNOSIS — I5032 Chronic diastolic (congestive) heart failure: Secondary | ICD-10-CM

## 2024-08-30 DIAGNOSIS — I2510 Atherosclerotic heart disease of native coronary artery without angina pectoris: Secondary | ICD-10-CM

## 2024-08-30 DIAGNOSIS — Z992 Dependence on renal dialysis: Secondary | ICD-10-CM | POA: Diagnosis not present

## 2024-08-30 DIAGNOSIS — I4891 Unspecified atrial fibrillation: Secondary | ICD-10-CM | POA: Diagnosis not present

## 2024-08-30 DIAGNOSIS — I509 Heart failure, unspecified: Secondary | ICD-10-CM | POA: Diagnosis not present

## 2024-08-30 DIAGNOSIS — N186 End stage renal disease: Secondary | ICD-10-CM | POA: Diagnosis not present

## 2024-08-30 NOTE — Patient Instructions (Addendum)
 Medication Instructions:  Your physician recommends that you continue on your current medications as directed. Please refer to the Current Medication list given to you today.  *If you need a refill on your cardiac medications before your next appointment, please call your pharmacy*  Lab Work: NONE  If you have labs (blood work) drawn today and your tests are completely normal, you will receive your results only by: MyChart Message (if you have MyChart) OR A paper copy in the mail If you have any lab test that is abnormal or we need to change your treatment, we will call you to review the results.  Testing/Procedures: NONE  Follow-Up: At [facility], you and your health needs are our priority.  As part of our continuing mission to provide you with exceptional heart care, our providers are all part of one team.  This team includes your primary Cardiologist (physician) and Advanced Practice Providers or APPs (Physician Assistants and Nurse Practitioners) who all work together to provide you with the care you need, when you need it.  Your next appointment:   6 month(s)  Provider:   Glendia Ferrier, PA or Lum Louis, NP

## 2024-08-30 NOTE — Progress Notes (Signed)
 Cardiology Office Note:  .    Date:  08/30/2024  ID:  Sean [name], DOB [birth year], MRN [id number] PCP: Jesus Bernardino MATSU, MD  [facility] Providers Cardiologist:  Stanly DELENA Leavens, MD     CC: Follow up CAD and HF  History of Present Illness: Sean [name]    Sean [name] is a 53 y.o. male with coronary artery disease (prior CABG), ischemic cardiomyopathy, and CKD stage 5 who presents for evaluation and planning for dialysis.  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 He was referred for follow-up after a hospital visit.  He has a history of coronary artery disease and has undergone coronary artery bypass grafting. He experiences chest tightness and heavy breathing during physical exertion, such as walking up steps or long-distance walking.  He has end stage renal disease and is on dialysis. He experiences low blood pressure during dialysis sessions, which previously required him to be taken out in a wheelchair due to dizziness. Adjusting his medication schedule to take blood pressure medications after dialysis has improved his symptoms. He feels drained after dialysis but no longer experiences dizziness since adjusting his medication schedule.  He has a history of paroxysmal supraventricular tachycardia and is on diltiazem  twice a day. He was temporarily taken off diltiazem  for a week but has since resumed it. He manages to take his medications after eating.  He has MASH cirrhosis and is being evaluated by the liver team.  Recently, he experienced confusion and was found naked on the floor at home, leading to an emergency room visit. He was diagnosed with a urinary tract infection and pneumonia, which were treated. He does not recall the events leading to the hospital visit.  Relevant histories: .  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   Studies  Reviewed: .   Cardiac Studies & Procedures   ______________________________________________________________________________________________ CARDIAC CATHETERIZATION  CARDIAC CATHETERIZATION 11/02/2020  Conclusion 1.  Severe single-vessel coronary artery disease with total occlusion of the ostial LAD 2.  Widely patent left main, left circumflex, and RCA with no significant stenoses 3.  Patent LIMA to LAD with no significant stenosis present 4.  Normal LVEDP  Recommendations: Ongoing medical therapy.  The patient has stable coronary anatomy with continued patency of the LIMA graft.  Patient may resume apixaban  tomorrow morning at his normal dosing schedule.  Findings Coronary Findings Diagnostic  Dominance: Right  Left Anterior Descending Ost LAD lesion is 100% stenosed. The ostium of the LAD is flush occluded, unchanged from the previous heart catheterization. Mid LAD lesion is 30% stenosed.  Left Circumflex  First Obtuse Marginal Branch The circumflex and its branch vessels are patent with no significant stenoses.  The circumflex supplies 2 proximal obtuse marginal branches with no stenoses and a posterior lateral branch with no stenosis.  Right Coronary Artery Vessel is large. There is mild diffuse disease throughout the vessel. The right coronary artery is patent throughout.  There are diffuse irregularities.  There are no significant stenoses. Prox RCA lesion is 15% stenosed.  LIMA LIMA Graft To Mid LAD LIMA and is normal in caliber. The LIMA to LAD graft is widely patent with no significant stenosis.  There is mild stenosis in the mid LAD just beyond the LIMA insertion that appears about 30% narrowed.  Intervention  No interventions have been documented.   CARDIAC CATHETERIZATION  CARDIAC  CATHETERIZATION 04/15/2017  Conclusion  Ost LAD lesion, 100 %stenosed.  Prox RCA lesion, 15 %stenosed.  Mid LAD lesion, 25 %stenosed.  LIMA and is normal in caliber and  anatomically normal.  The left ventricular ejection fraction is 50-55% by visual estimate.  The left ventricular systolic function is normal.  Normal LV function with an ejection fraction of 50-55% without definitive focal segmental wall motion abnormalities.  Significant native CAD with total occlusion of the LAD at the ostium; normal codominant left circumflex vessel, and RCA with smooth 10% luminal narrowing of the proximal vessel.  Patent LIMA graft supplying the LAD.  There is a mild 25% smooth narrowing in the LAD beyond the anastomosis.  RECOMMENDATION: Medical therapy.  Findings Coronary Findings Diagnostic  Dominance: Right  Left Anterior Descending  Right Coronary Artery  LIMA LIMA Graft To Mid LAD LIMA and is normal in caliber and anatomically normal.  Intervention  No interventions have been documented.   STRESS TESTS  MYOCARDIAL PERFUSION IMAGING 04/17/2023  Interpretation Summary   Findings are consistent with no ischemia. The study is low risk.   No ST deviation was noted.   LV perfusion is normal.   Left ventricular function is normal. Nuclear stress EF: 57 %. The left ventricular ejection fraction is normal (55-65%). End diastolic cavity size is moderately enlarged. End systolic cavity size is moderately enlarged.   Prior study available for comparison from 10/04/2021. Abnormal perfusion. Apical to mid inferior fixed defect and apical to mid anterior wall defect, both thought to be artifact. LVEF 47%.  Large size, moderate intensity fixed anterior and inferior perfusion defects without reversible ischemia, consistent with artifact. LVEF 57%, moderately dilated LV with normal wall motion. This is a low risk study. Compared to a prior study in 2022, the perfusion defects are unchanged and favored to be artifact. The LVEF has improved.   ECHOCARDIOGRAM  ECHOCARDIOGRAM COMPLETE 04/16/2023  Narrative ECHOCARDIOGRAM REPORT    Patient Name:   Sean [name]  [facility] Date of Exam: 04/16/2023 Medical Rec #:  [id number]           Height:       71.0 in Accession #:    [id number]          Weight:       360.0 lb Date of Birth:  [birth year]           BSA:          2.707 m Patient Age:    52 years            BP:           150/72 mmHg Patient Gender: M                   HR:           61 bpm. Exam Location:  [address]  Procedure: 2D Echo, Cardiac Doppler, Color Doppler and Intracardiac Opacification Agent  Indications:    I25.10 Coronary artery disease  History:        Patient has prior history of Echocardiogram examinations, most recent 03/31/2016. CAD, Prior CABG; Risk Factors:Hypertension. Obstructive sleep apnea-CPAP Chronic combined systolic and diastolic heart failure. Chronic kidney disease.  Sonographer:    Carl Coma RDCS Referring Phys: [id number] [facility] Amorette Charrette   Sonographer Comments: Technically difficult study due to poor echo windows. IMPRESSIONS   1. Left ventricular ejection fraction, by estimation, is 50 to 55%. The left ventricle has low normal function. The left ventricle has no  regional wall motion abnormalities. Left ventricular diastolic parameters were normal. 2. Right ventricular systolic function is normal. The right ventricular size is mildly enlarged. 3. Left atrial size was mildly dilated. 4. The mitral valve is normal in structure. No evidence of mitral valve regurgitation. No evidence of mitral stenosis. 5. The aortic valve is tricuspid. Aortic valve regurgitation is not visualized. No aortic stenosis is present. 6. Aortic dilatation noted. There is mild dilatation of the ascending aorta, measuring 42 mm. 7. The inferior vena cava is normal in size with greater than 50% respiratory variability, suggesting right atrial pressure of 3 mmHg.  FINDINGS Left Ventricle: Left ventricular ejection fraction, by estimation, is 50 to 55%. The left ventricle has low normal function. The left ventricle has no  regional wall motion abnormalities. Definity  contrast agent was given IV to delineate the left ventricular endocardial borders. The left ventricular internal cavity size was normal in size. There is no left ventricular hypertrophy. Left ventricular diastolic parameters were normal.  Right Ventricle: The right ventricular size is mildly enlarged. No increase in right ventricular wall thickness. Right ventricular systolic function is normal.  Left Atrium: Left atrial size was mildly dilated.  Right Atrium: Right atrial size was normal in size.  Pericardium: There is no evidence of pericardial effusion.  Mitral Valve: The mitral valve is normal in structure. No evidence of mitral valve regurgitation. No evidence of mitral valve stenosis.  Tricuspid Valve: The tricuspid valve is normal in structure. Tricuspid valve regurgitation is not demonstrated. No evidence of tricuspid stenosis.  Aortic Valve: The aortic valve is tricuspid. Aortic valve regurgitation is not visualized. No aortic stenosis is present.  Pulmonic Valve: The pulmonic valve was normal in structure. Pulmonic valve regurgitation is not visualized. No evidence of pulmonic stenosis.  Aorta: The aortic root is normal in size and structure and aortic dilatation noted. There is mild dilatation of the ascending aorta, measuring 42 mm.  Venous: The inferior vena cava is normal in size with greater than 50% respiratory variability, suggesting right atrial pressure of 3 mmHg.  IAS/Shunts: No atrial level shunt detected by color flow Doppler.   LEFT VENTRICLE PLAX 2D LVIDd:         4.50 cm   Diastology LVIDs:         3.20 cm   LV e' medial:    7.47 cm/s LV PW:         1.00 cm   LV E/e' medial:  10.8 LV IVS:        1.00 cm   LV e' lateral:   9.19 cm/s LVOT diam:     2.50 cm   LV E/e' lateral: 8.7 LV SV:         77 LV SV Index:   28 LVOT Area:     4.91 cm   RIGHT VENTRICLE RV Basal diam:  5.00 cm RV S prime:     12.70  cm/s TAPSE (M-mode): 3.6 cm  LEFT ATRIUM             Index        RIGHT ATRIUM           Index LA diam:        4.40 cm 1.63 cm/m   RA Area:     18.90 cm LA Vol (A2C):   54.8 ml 20.24 ml/m  RA Volume:   61.70 ml  22.79 ml/m LA Vol (A4C):   92.6 ml 34.21 ml/m LA Biplane Vol: 77.8 ml 28.74  ml/m AORTIC VALVE LVOT Vmax:   77.40 cm/s LVOT Vmean:  49.550 cm/s LVOT VTI:    0.156 m  AORTA Ao Root diam: 3.70 cm Ao Asc diam:  4.20 cm  MITRAL VALVE MV Area (PHT): 3.88 cm    SHUNTS MV Decel Time: 196 msec    Systemic VTI:  0.16 m MV E velocity: 80.40 cm/s  Systemic Diam: 2.50 cm MV A velocity: 57.60 cm/s MV E/A ratio:  1.40  Oneil Parchment MD Electronically signed by Oneil Parchment MD Signature Date/Time: 04/16/2023/12:31:40 PM    Final   TEE  ECHO TEE 02/14/2016  Narrative *[facility]* *[facility]* [address] [place], [place] [address] [phone]  ------------------------------------------------------------------- Transesophageal Echocardiography with Cardioversion  Patient:    Taeshawn, Helfman MR #:       [id number] Study Date: 02/14/2016 Gender:     M Age:        45 Height:     180.3 cm Weight:     149.1 kg BSA:        2.81 m^2 Pt. Status: Room:       [id number]  TISA Shad, Shona MATSU PERFORMING   Vinie Maxcy MD ADMITTING    Leim Moose, M.D. ATTENDING    Leim Moose, M.D. SONOGRAPHER  Ellouise Mose  cc:  ------------------------------------------------------------------- LV EF: 35% -   40%  ------------------------------------------------------------------- History:   PMH:  OSA.  Atrial fibrillation.  Atrial flutter. Coronary artery disease.  Risk factors:  Hypertension. Dyslipidemia.  ------------------------------------------------------------------- Study Conclusions  - Left ventricle: There was mild concentric hypertrophy. Systolic function was moderately reduced. The estimated ejection fraction was in the range  of 35% to 40%. Diffuse hypokinesis. No evidence of thrombus. - Mitral valve: There was mild regurgitation. - Left atrium: The atrium was dilated. No evidence of thrombus in the atrial cavity or appendage. No evidence of thrombus in the atrial cavity or appendage. - Pulmonary veins: No anomaly. - Right atrium: No evidence of thrombus in the atrial cavity or appendage. - Atrial septum: Anuerysmal. No defect or patent foramen ovale was identified. - Pulmonic valve: No evidence of vegetation.  Impressions:  - Successful cardioversion. No cardiac source of emboli was indentified.  ------------------------------------------------------------------- Labs, prior tests, procedures, and surgery: Coronary artery bypass grafting.  Transesophageal echocardiography with cardioversion.  2D and intravenous contrast injection.  Birthdate:  Patient birthdate: [birth year].  Age:  Patient is 53 yr old.  Sex:  Gender: male. BMI: 45.8 kg/m^2.  Blood pressure:     100/64  Patient status: Inpatient.  Study date:  Study date: 02/14/2016. Study time: 11:52 AM.  Location:  Endoscopy.  -------------------------------------------------------------------  ------------------------------------------------------------------- Left ventricle:  There was mild concentric hypertrophy. Systolic function was moderately reduced. The estimated ejection fraction was in the range of 35% to 40%. Diffuse hypokinesis.  No evidence of thrombus.  ------------------------------------------------------------------- Aortic valve:   Trileaflet.  ------------------------------------------------------------------- Aorta:  The aorta was normal, not dilated, and non-diseased.  ------------------------------------------------------------------- Mitral valve:   Doppler:  There was mild regurgitation.  ------------------------------------------------------------------- Left atrium:  The atrium was dilated.  No evidence of  thrombus in the atrial cavity or appendage.  No evidence of thrombus in the atrial cavity or appendage.  ------------------------------------------------------------------- Atrial septum:  Anuerysmal. No defect or patent foramen ovale was identified.  ------------------------------------------------------------------- Pulmonary veins:  No anomaly.  ------------------------------------------------------------------- Right ventricle:  The cavity size was normal. Wall thickness was normal. Systolic function was normal.  ------------------------------------------------------------------- Pulmonic valve:  Structurally normal valve.   Cusp separation was normal.  No evidence of vegetation.  Doppler:  There was trivial regurgitation.  ------------------------------------------------------------------- Pulmonary artery:   The main pulmonary artery was normal-sized.  ------------------------------------------------------------------- Right atrium:  The atrium was normal in size.  No evidence of thrombus in the atrial cavity or appendage.  ------------------------------------------------------------------- Pericardium:  There was no pericardial effusion.  ------------------------------------------------------------------- Post procedure conclusions Ascending Aorta:  - The aorta was normal, not dilated, and non-diseased.  ------------------------------------------------------------------- Prepared and Electronically Authenticated by  Vinie Maxcy MD 2017-03-16T16:51:54  MONITORS  LONG TERM MONITOR (3-14 DAYS) 12/31/2021  Narrative  Patient had a minimum heart rate of 36 bpm, maximum heart rate of 115 bpm, and average heart rate of 61 bpm.  Predominant underlying rhythm was sinus rhythm.  Isolated PACs were rare (<1.0%).  Isolated PVCs were rare (<1.0%).  Four nocturnal sinus pauses occurred, the longest lasting 3.3 seconds.  Triggered and diary events associated with  sinus bradycardia.  No malignant arrhythmias.   CT SCANS  CT CORONARY MORPH W/CTA COR W/SCORE 12/25/2015  Addendum 12/25/2015  3:48 PM ADDENDUM REPORT: 12/25/2015 15:46 CLINICAL DATA:  53 year old male with severe proximal LAD lesion scheduled for a CABG and suspicion for dextrocardia on cardiac catheterization. EXAM: Cardiac/Coronary  CT TECHNIQUE: The patient was scanned on a Philips 256 scanner. FINDINGS: A 120 kV prospective scan was triggered in the descending thoracic aorta at 111 HU's. Axial non-contrast 3mm slices were carried out through the heart. The data set was analyzed on a dedicated work station and scored using the Agatson method. Gantry rotation speed was 270 msecs and collimation was .9 mm. No beta blockade or NTG was given. The 3D data set was reconstructed in 5% intervals of the 67-82 % of the R-R cycle. Diastolic phases were analyzed on a dedicated work station using MPR, MIP and VRT modes. The patient received 80 cc of contrast. Aorta: Normal caliber, no calcifications or dissection. Normal origin of the brachiocephalic arteries. Aortic Valve:  Trileaflet, no calcifications. Coronary Arteries: Originating in a normal position. Right dominance. Left main is short with no obvious plaque. Ostial to proximal LAD has a severe mixed plaque associated with > 90% stenosis. The plaque has almost circumferential calcifications but also has non-calcified portion. There is another mild calcified plaque in teh mid LAD associated with 25-50% stenosis. LCX is nondominant vessel. There is mild calcified plaque in OM1 associated with 25-50% stenosis. RCA is a dominant vessel with minimal calcifications in the proximal and mid portion. Other findings: The heart is rotated anteriorly with apex-base axis directed antero-posteriorly, no obvious dextrocardia present. No other anomalies such as situs inversus were seen. Mild concentric hypertrophy. Dilated pulmonary artery  measuring 30 x 36 mm suggestive of pulmonary hypertension. Normal size of the right sided chambers. Normal pulmonary veins draining into the left atrium. No ASD or VSD.  There is a possible small PFO. IMPRESSION: 1. Coronary calcium  score of 327. This was 37 percentile for age and sex matched control. 2. Severe obstructive CAD in the ostial to proximal LAD with mixed plaque associated with > 90% stenosis. Only mild non-obstructive plaque in LCX and RCA. 3. Anterior rotation of the cardiac apex, no obvious dextrocardia or other anomalies such as situs inversus. 4. Mild concentric LVH. 5. Dilated pulmonary artery suggestive of pulmonary hypertension. 6. Possible PFO. Leim Moose Electronically Signed By: Leim Moose On: 12/25/2015 15:46  Narrative EXAM: OVER-READ INTERPRETATION  CT CHEST  The following report is an over-read  performed by radiologist Dr. Toribio Cove [facility] on 12/25/2015. This over-read does not include interpretation of cardiac or coronary anatomy or pathology. The coronary calcium  score/coronary CTA interpretation by the cardiologist is attached.  COMPARISON:  No priors.  FINDINGS: Within the visualized portions of the thorax there are no suspicious appearing pulmonary nodules or masses, there is no acute consolidative airspace disease, no pleural effusions and no pneumothorax. Linear areas of scarring are noted in the lung bases bilaterally, particularly in the left lower lobe. No lymphadenopathy in the visualize mediastinal or hilar nodal stations. Visualized portions of the upper abdomen are remarkable for postoperative changes of prior cholecystectomy. There are no aggressive appearing lytic or blastic lesions noted in the visualized portions of the skeleton.  IMPRESSION: 1. No acute incidental noncardiac findings noted.  Electronically Signed: By: Toribio Aye M.D. On: 12/25/2015 15:06      ______________________________________________________________________________________________       Physical Exam:    VS:  BP 127/62   Pulse 67   Ht 5' 11 (1.803 m)   Wt (!) 362 lb (164.2 kg)   SpO2 96%   BMI 50.49 kg/m    Wt Readings from Last 3 Encounters:  08/30/24 (!) 362 lb (164.2 kg)  08/09/24 (!) 361 lb (163.7 kg)  08/05/24 (!) 362 lb 7 oz (164.4 kg)    Gen: no distress, Morbid obesity   Neck: No JVD Cardiac: No Rubs or Gallops, no Murmur, RRR +2 radial pulses Respiratory: Clear to auscultation bilaterally, normal effort, normal  respiratory rate GI: Soft, nontender, non-distended  MS: +2 bilateral edema;  moves all extremities Integument: Skin feels warm Neuro:  At time of evaluation, alert and oriented to person/place/time/situation; no asterixis but has resting tremor (hands) Psych: Normal affect, patient feels ok   ASSESSMENT AND PLAN: .    Coronary artery disease status post CABG - Intermittent chest pain with exertion, such as walking or climbing stairs. No recent episodes of severe chest pain. Nitroglycerin  has not been used recently. Renal function is no longer a limiting factor for cardiac catheterization due to end stage renal disease. - Ensure nitroglycerin  prescription is up to date - Instruct to report any significant chest pain for potential cardiac catheterization  Paroxsymal Atrial fibrillation on anticoagulation; hx of AFL - Currently on Eliquis  for anticoagulation. Current dose is appropriate. - Continue current dose of Eliquis   Paroxysmal supraventricular tachycardia No specific discussion or changes regarding paroxysmal supraventricular tachycardia in this encounter.  Hypertension with medication adjustment Hypertension management complicated by low blood pressure episodes during dialysis. Losartan  was discontinued due to lack of efficacy in the context of end stage renal disease. Diltiazem  dose adjusted to avoid hypotension on dialysis  days. - Administer diltiazem  only in the evening on dialysis days to prevent hypotension  End stage renal disease on hemodialysis Experiencing low blood pressure during dialysis sessions, leading to dizziness and requiring assistance. Dialysis access is functioning well. - Continue current dialysis regimen - Administer blood pressure medications post-dialysis to prevent hypotension  F/u with [place] or [place] in 6 months  Longitudinal care: The evaluation and management services provided today reflect the complexity inherent in caring for this patient, including the ongoing longitudinal relationship and management of multiple chronic conditions and/or the need for care coordination. The visit required a comprehensive assessment and management plan tailored to the patient's unique needs Time was spent addressing not only the acute concerns but also the broader context of the patient's health, including preventive care, chronic disease  management, and care coordination as appropriate.  Complex longitudinal is necessary for conditions including: chronica CAD and AF management in the setting of labile BP and now with ESRD   Stanly Leavens, MD FASE [facility] Cardiologist [facility]  [address] [place], [address] [place], [place] [address] [phone]  5:49 PM

## 2024-08-31 ENCOUNTER — Ambulatory Visit: Payer: Self-pay

## 2024-08-31 DIAGNOSIS — Z992 Dependence on renal dialysis: Secondary | ICD-10-CM | POA: Diagnosis not present

## 2024-08-31 DIAGNOSIS — N186 End stage renal disease: Secondary | ICD-10-CM | POA: Diagnosis not present

## 2024-08-31 DIAGNOSIS — N2581 Secondary hyperparathyroidism of renal origin: Secondary | ICD-10-CM | POA: Diagnosis not present

## 2024-08-31 NOTE — Telephone Encounter (Signed)
 Called [facility], spoke Therisa, will call patient.SABRA

## 2024-08-31 NOTE — Telephone Encounter (Signed)
 FYI Only or Action Required?: Action required by provider: clinical question for provider.  Patient was last seen in primary care on 08/09/2024 by Job Lukes, PA.  Called Nurse Triage reporting Urinary Tract Infection.  Symptoms began several days ago.  Interventions attempted: Nothing.  Symptoms are: unchanged.  Triage Disposition: See Physician Within 24 Hours  Patient/caregiver understands and will follow disposition?: No, wishes to speak with PCP  Copied from CRM #[id number]. Topic: Clinical - Red Word Triage >> Aug 31, 2024  3:36 PM Aisha D wrote: Red Word that prompted transfer to Nurse Triage: burning   Pt stated that he thinks he has a UTI and is experiencing a burning sensation. Pt would like to send a message to the provider requesting a callback in regards to this issue. Reason for Disposition  Urinating more frequently than usual (i.e., frequency) OR new-onset of the feeling of an urgent need to urinate (i.e., urgency)  Answer Assessment - Initial Assessment Questions No available appts today. Advised UC today and ED if symptoms worsen. Patient declined UC and requests Call Back and antibiotic today.  1. SYMPTOM: What's the main symptom you're concerned about? (e.g., frequency, incontinence)     Pain, urgency 2. ONSET: When did the    start?     3 days ago 3. PAIN: Is there any pain? If Yes, ask: How bad is it? (Scale: 1-10; mild, moderate, severe)     6/10 4. CAUSE: What do you think is causing the symptoms?     UTI 5. OTHER SYMPTOMS: Do you have any other symptoms? (e.g., blood in urine, fever, flank pain, pain with urination)     Denies fever, chills, abd/back pain, blood in urine Patient reports hemodialysis.  Protocols used: Urinary Symptoms-A-AH

## 2024-08-31 NOTE — Telephone Encounter (Signed)
 Appt tomorrow

## 2024-09-01 ENCOUNTER — Encounter: Payer: Self-pay | Admitting: Family Medicine

## 2024-09-01 ENCOUNTER — Ambulatory Visit (INDEPENDENT_AMBULATORY_CARE_PROVIDER_SITE_OTHER): Admitting: Family Medicine

## 2024-09-01 VITALS — BP 125/65 | HR 66 | Temp 98.4°F | Ht 71.0 in | Wt 357.8 lb

## 2024-09-01 DIAGNOSIS — N3001 Acute cystitis with hematuria: Secondary | ICD-10-CM | POA: Diagnosis not present

## 2024-09-01 DIAGNOSIS — K219 Gastro-esophageal reflux disease without esophagitis: Secondary | ICD-10-CM

## 2024-09-01 DIAGNOSIS — N186 End stage renal disease: Secondary | ICD-10-CM | POA: Diagnosis not present

## 2024-09-01 DIAGNOSIS — Z992 Dependence on renal dialysis: Secondary | ICD-10-CM

## 2024-09-01 DIAGNOSIS — R350 Frequency of micturition: Secondary | ICD-10-CM

## 2024-09-01 LAB — POCT URINALYSIS DIPSTICK
Bilirubin, UA: 1 — AB
Blood, UA: 3 — CR
Glucose, UA: POSITIVE — AB
Ketones, UA: NEGATIVE
Nitrite, UA: NEGATIVE
Protein, UA: NEGATIVE
Spec Grav, UA: 1.025 (ref 1.010–1.025)
Urobilinogen, UA: 0.2 U/dL
pH, UA: 5.5 (ref 5.0–8.0)

## 2024-09-01 LAB — URINALYSIS, MICROSCOPIC ONLY

## 2024-09-01 MED ORDER — SULFAMETHOXAZOLE-TRIMETHOPRIM 800-160 MG PO TABS: 1.0000 | ORAL_TABLET | Freq: Every day | ORAL | 0 refills | Status: DC

## 2024-09-01 NOTE — Patient Instructions (Signed)
 Please follow up if symptoms do not improve or as needed.     VISIT SUMMARY: Today, you were seen for symptoms that suggest a urinary tract infection (UTI). You have been experiencing a burning sensation at the end of urination and frequent urges to urinate for the past 3-4 days. You have a history of recurrent bladder infections and were recently hospitalized for a UTI and pneumonia.  YOUR PLAN: -URINARY TRACT INFECTION (UTI): A UTI is an infection in any part of your urinary system. You will take an antibiotic once daily after your dialysis session. A urine sample will be sent for culture and microscopy to identify the specific bacteria causing the infection. The results will take about two days, and we will review them over the weekend if possible. Please monitor your symptoms and seek medical care if they worsen.  INSTRUCTIONS: Please take the prescribed antibiotic once daily after your dialysis session. Monitor your symptoms closely and seek medical attention if they worsen. We will review your urine culture results over the weekend if possible.                      Contains text generated by Abridge.                                 Contains text generated by Abridge.

## 2024-09-01 NOTE — Progress Notes (Signed)
 Subjective  CC:  Chief Complaint  Patient presents with   Urinary Frequency    Frequent urination along with burning for the past 3/4 days. Symptoms are about the same    HPI: Sean [name] is a 53 y.o. male who presents to the office today to address the problems listed above in the chief complaint. Discussed the use of AI scribe software for clinical note transcription with the patient, who gave verbal consent to proceed.  History of Present Illness Sean [name] [place] is a 53 year old male who presents with symptoms suggestive of a urinary tract infection. Complicated medical patient with history of end-stage renal disease on dialysis, cirrhosis and multiple other comorbidities, see problem list below.  Has urinary frequency and dysuria.  Reviewed hospital records from admission early September for encephalopathy metabolic.  He did have a Klebsiella UTI at that time.  Dysuria and urinary frequency - Dysuria characterized by burning sensation at the end of urination - Urinary frequency with sensation of needing to urinate again shortly after voiding - Symptoms have persisted for 3-4 days - No fever or chills - History of recurrent bladder infections - No mental status changes   Gastroesophageal reflux symptoms and medication access - Currently taking medication for acid reflux prescribed by Dr. Jesus voquezna  - Insurance does not cover the prescribed medication - Receiving medication samples, requesting samples   Assessment  1. Frequent urination   2. Acute cystitis with hematuria   3. ESRD (end stage renal disease) on dialysis (HCC)   4. Gastroesophageal reflux disease, unspecified whether esophagitis present      Plan  Assessment and Plan Assessment & Plan Urinary tract infection with frequency of micturition Symptoms and urinalysis indicate UTI without systemic illness. History of bladder infections and recent UTI hospitalization noted. - Prescribe  antibiotic once daily post-dialysis.  Septra  DS, 1 pill daily, take after dialysis on dialysis days.  Treat for 7 days. - Send urine for culture and microscopy. - Informed culture results take two days, review over weekend if possible. - Advised to monitor symptoms and seek care if worsening. - No symptoms or signs of systemic infection at this time.  High risk patient  GERD: Voquenza samples10 mg dispensed #30, to be taken daily .  End-stage renal disease : Vital signs are stable.  No signs of volume overload today.    Follow up: As needed Orders Placed This Encounter  Procedures   Urine Culture   Urine Microscopic   POCT Urinalysis Dipstick   Meds ordered this encounter  Medications   sulfamethoxazole -trimethoprim  (BACTRIM  DS) 800-160 MG tablet    Sig: Take 1 tablet by mouth daily. Take after dialysis on dialysis days    Dispense:  7 tablet    Refill:  0     I reviewed the patients updated PMH, FH, and SocHx.  Patient Active Problem List   Diagnosis Date Noted   Decompensated cirrhosis (HCC) 08/03/2024   Acute hepatic encephalopathy (HCC) 08/03/2024   UTI (urinary tract infection) 08/03/2024   Consult 08/03/2024   Inadequate material resources 07/21/2024   Abnormal bowel habits 04/23/2024   Inflamed internal hemorrhoid 03/15/2024   Other disorders of phosphorus metabolism 02/22/2024   PUD (peptic ulcer disease) 02/06/2024   Disability affecting daily living 02/06/2024   Mild protein-calorie malnutrition 01/26/2024   Anemia in chronic kidney disease 01/18/2024   Coagulation defect, unspecified 01/18/2024   Other chronic pain 01/18/2024   Other fluid overload 01/18/2024   Iron  deficiency anemia, unspecified 01/18/2024   Other iron deficiency anemias 01/18/2024   Pruritus, unspecified 01/18/2024   Shortness of breath 01/18/2024   Atrial fibrillation (HCC) 01/15/2024   Atherosclerotic heart disease of native coronary artery without angina pectoris 01/13/2024   ESRD  (end stage renal disease) (HCC) 01/13/2024   Chronic diastolic CHF (congestive heart failure) (HCC) 01/13/2024   Dependence on renal dialysis 01/13/2024   Gout due to renal impairment, unspecified site 01/13/2024   Hypertensive chronic kidney disease with stage 5 chronic kidney disease or end stage renal disease (HCC) 01/13/2024   Pure hypercholesterolemia, unspecified 01/13/2024   Secondary hyperparathyroidism of renal origin 01/13/2024   Asthma, chronic 01/09/2024   Recurrent sinusitis 11/27/2023   Rectal bleeding 11/03/2023   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 04/18/2023   MGUS (monoclonal gammopathy of unknown significance) 04/05/2023   Amitriptyline  adverse reaction 03/02/2023   Chronic liver failure without hepatic coma (HCC) 01/26/2023   Splenomegaly 01/26/2023   Spondylolisthesis 01/26/2023   Bleeding diathesis 01/26/2023   Chronic nausea 11/25/2022   Chronic venous stasis dermatitis of both lower extremities 10/13/2022   Statins contraindicated 11/01/2021   Hypothyroid 11/01/2021   Herpes simplex 04/04/2021   Ectatic aorta 06/15/2020   Gout 09/05/2019   Gastroesophageal reflux disease without esophagitis 09/05/2019   Malaise and fatigue 09/05/2019   Allergic rhinitis due to pollen 09/05/2019   Alkaline phosphatase elevation 01/23/2017   Metabolic dysfunction-associated steatohepatitis (MASH) 01/23/2017   Chronic gout of multiple sites 01/20/2017   Prediabetes 01/20/2017   Other long term (current) drug therapy 02/19/2016   Obstructive sleep apnea syndrome 02/19/2016   Atrial flutter with rapid ventricular response (HCC) 02/12/2016   Coronary artery disease involving native coronary artery of native heart without angina pectoris 01/04/2016   Morbid obesity with BMI of 50.0-59.9, adult (HCC) 01/04/2016   History of coronary artery bypass surgery 12/26/2015   Hypertension 12/23/2015   Hyperlipidemia 12/23/2015   Thrombocytopenia 12/23/2015   Sinus bradycardia  12/23/2015   History of umbilical hernia repair 07/10/2011   Current Meds  Medication Sig   allopurinol  (ZYLOPRIM ) 100 MG tablet TAKE 1 TABLET BY MOUTH EVERY DAY   amitriptyline  (ELAVIL ) 25 MG tablet TAKE 1 TABLET BY MOUTH EVERYDAY AT BEDTIME   azelastine  (ASTELIN ) 0.1 % nasal spray Place 2 sprays into both nostrils 2 (two) times daily. Use in each nostril as directed   B Complex-C-Folic Acid  (DIALYVITE  TABLET) TABS Take 1 tablet by mouth daily.   Cyanocobalamin  (VITAMIN B-12) 5000 MCG SUBL Place 5,000 mcg under the tongue daily.   diltiazem  (CARDIZEM  SR) 120 MG 12 hr capsule Take 1 capsule (120 mg total) by mouth 2 (two) times daily.   ELIQUIS  5 MG TABS tablet TAKE 1 TABLET BY MOUTH TWICE A DAY   Evolocumab  (REPATHA ) 140 MG/ML SOSY Inject 140 mg into the skin every 14 (fourteen) days.   folic acid -vitamin b complex-vitamin c-selenium-zinc (DIALYVITE ) 3 MG TABS tablet Take 1 tablet by mouth daily.   hydrocortisone  (ANUSOL -HC) 25 MG suppository Place 1 suppository (25 mg total) rectally 2 (two) times daily.   lactulose  (CHRONULAC ) 10 GM/15ML solution Take 45 mLs (30 g total) by mouth 4 (four) times daily as needed (to acheive goal of 2-3 BMs daily). Titrate based on the stool frequency.   lactulose , encephalopathy, (CHRONULAC ) 10 GM/15ML SOLN Take 30 mLs by mouth in the morning and at bedtime.   lanthanum  (FOSRENOL ) 1000 MG chewable tablet Chew 1,000 mg by mouth 3 (three) times daily with meals.  losartan  (COZAAR ) 50 MG tablet Take 1 tablet (50 mg total) by mouth daily.   Methoxy PEG-Epoetin Beta (MIRCERA IJ) 75 mcg.   montelukast  (SINGULAIR ) 10 MG tablet TAKE 1 TABLET BY MOUTH EVERY DAY   mupirocin  ointment (BACTROBAN ) 2 % Apply to inner nares twice daily   nitroGLYCERIN  (NITROSTAT ) 0.4 MG SL tablet Place 1 tablet (0.4 mg total) under the tongue every 5 (five) minutes as needed for chest pain.   ondansetron  (ZOFRAN -ODT) 4 MG disintegrating tablet Take 1 tablet (4 mg total) by mouth every 8  (eight) hours as needed for nausea or vomiting.   oxyCODONE -acetaminophen  (PERCOCET) 5-325 MG tablet Take 1 tablet by mouth every 6 (six) hours as needed for severe pain (pain score 7-10).   polyethylene glycol (MIRALAX  / GLYCOLAX ) 17 g packet Take 17 g by mouth daily as needed. Titrate based on the stool frequency. Goal :2-3 BMs a day,   promethazine  (PHENERGAN ) 25 MG tablet Take 1 tablet (25 mg total) by mouth every 8 (eight) hours as needed for nausea or vomiting.   rifaximin  (XIFAXAN ) 550 MG TABS tablet Take 1 tablet (550 mg total) by mouth 2 (two) times daily.   sucralfate  (CARAFATE ) 1 g tablet Take 1 g by mouth at bedtime.   sulfamethoxazole -trimethoprim  (BACTRIM  DS) 800-160 MG tablet Take 1 tablet by mouth daily. Take after dialysis on dialysis days   torsemide  (DEMADEX ) 10 MG tablet Take 10 mg by mouth daily.   Vonoprazan Fumarate  (VOQUEZNA ) 20 MG TABS Take by mouth.   Allergies: Patient is allergic to glucophage  [metformin ], vibra -tab [doxycycline ], chlorhexidine , chlorhexidine  gluconate, augmentin [amoxicillin-pot clavulanate], imdur  [isosorbide  dinitrate], tape, and valacyclovir . Family History: Patient family history includes CAD in his father; Diabetes in his father, maternal grandfather, and maternal uncle; Heart attack in an other family member; Hyperlipidemia in an other family member. Social History:  Patient  reports that he has never smoked. He has never used smokeless tobacco. He reports that he does not currently use alcohol. He reports that he does not use drugs.  Review of Systems: Constitutional: Negative for fever malaise or anorexia Cardiovascular: negative for chest pain Respiratory: negative for SOB or persistent cough Gastrointestinal: negative for abdominal pain  Objective  Vitals: BP 125/65   Pulse 66   Temp 98.4 F (36.9 C)   Ht 5' 11 (1.803 m)   Wt (!) 357 lb 12.8 oz (162.3 kg)   SpO2 97%   BMI 49.90 kg/m  General: no acute distress , A&Ox3, no  apparent distress HEENT: PEERL, conjunctiva normal, neck is supple   Office Visit on 09/01/2024  Component Date Value Ref Range Status   Color, UA 09/01/2024 dark yellow   Final   Clarity, UA 09/01/2024 cloudy   Final   Glucose, UA 09/01/2024 Positive (A)  Negative Final   Bilirubin, UA 09/01/2024 1 (A)   Final   Ketones, UA 09/01/2024 negative   Final   Spec Grav, UA 09/01/2024 1.025  1.010 - 1.025 Final   Blood, UA 09/01/2024 3 (AA)   Final   pH, UA 09/01/2024 5.5  5.0 - 8.0 Final   Protein, UA 09/01/2024 Negative  Negative Final   Urobilinogen, UA 09/01/2024 0.2  0.2 or 1.0 E.U./dL Final   Nitrite, UA 89/97/7974 negative   Final   Leukocytes, UA 09/01/2024 Small (1+) (A)  Negative Final    Commons side effects, risks, benefits, and alternatives for medications and treatment plan prescribed today were discussed, and the patient expressed understanding of the given instructions.  Patient is instructed to call or message via MyChart if he/she has any questions or concerns regarding our treatment plan. No barriers to understanding were identified. We discussed Red Flag symptoms and signs in detail. Patient expressed understanding regarding what to do in case of urgent or emergency type symptoms.  Medication list was reconciled, printed and provided to the patient in AVS. Patient instructions and summary information was reviewed with the patient as documented in the AVS. This note was prepared with assistance of Dragon voice recognition software. Occasional wrong-word or sound-a-like substitutions may have occurred due to the inherent limitations of voice recognition software

## 2024-09-02 DIAGNOSIS — N2581 Secondary hyperparathyroidism of renal origin: Secondary | ICD-10-CM | POA: Diagnosis not present

## 2024-09-02 DIAGNOSIS — Z992 Dependence on renal dialysis: Secondary | ICD-10-CM | POA: Diagnosis not present

## 2024-09-02 DIAGNOSIS — N186 End stage renal disease: Secondary | ICD-10-CM | POA: Diagnosis not present

## 2024-09-02 LAB — URINE CULTURE
MICRO NUMBER:: 17048068
SPECIMEN QUALITY:: ADEQUATE

## 2024-09-04 ENCOUNTER — Ambulatory Visit: Payer: Self-pay | Admitting: Family Medicine

## 2024-09-04 NOTE — Progress Notes (Signed)
 See mychart note Dear Mr. Winkels, I hope you are feeling better. Your urine did show white and red blood cells when examined under the microscope, however the culture is negative. I recommend completing the antibiotics and letting Dr. Jesus know if you are not feeling better.  Sincerely, Dr. Jodie

## 2024-09-05 ENCOUNTER — Telehealth: Payer: Self-pay | Admitting: Internal Medicine

## 2024-09-05 DIAGNOSIS — L299 Pruritus, unspecified: Secondary | ICD-10-CM | POA: Diagnosis not present

## 2024-09-05 DIAGNOSIS — Z992 Dependence on renal dialysis: Secondary | ICD-10-CM | POA: Diagnosis not present

## 2024-09-05 DIAGNOSIS — N2581 Secondary hyperparathyroidism of renal origin: Secondary | ICD-10-CM | POA: Diagnosis not present

## 2024-09-05 DIAGNOSIS — R52 Pain, unspecified: Secondary | ICD-10-CM | POA: Diagnosis not present

## 2024-09-05 DIAGNOSIS — N186 End stage renal disease: Secondary | ICD-10-CM | POA: Diagnosis not present

## 2024-09-05 MED ORDER — NITROGLYCERIN 0.4 MG SL SUBL: 0.4000 mg | SUBLINGUAL_TABLET | SUBLINGUAL | 3 refills | Status: AC | PRN

## 2024-09-05 NOTE — Telephone Encounter (Signed)
*  STAT* If patient is at the pharmacy, call can be transferred to refill team.   1. Which medications need to be refilled? (please list name of each medication and dose if known) nitroGLYCERIN  (NITROSTAT ) 0.4 MG SL tablet    2. Would you like to learn more about the convenience, safety, & potential cost savings by using the [facility]? no   3. Are you open to using the [facility] (Type [facility]. ). no   4. Which pharmacy/location (including street and city if local pharmacy) is medication to be sent to? [id number] - [place], [place] - [address]     5. Do they need a 30 day or 90 day supply? 30 day

## 2024-09-05 NOTE — Telephone Encounter (Signed)
 RX sent in

## 2024-09-07 DIAGNOSIS — R52 Pain, unspecified: Secondary | ICD-10-CM | POA: Diagnosis not present

## 2024-09-07 DIAGNOSIS — Z992 Dependence on renal dialysis: Secondary | ICD-10-CM | POA: Diagnosis not present

## 2024-09-07 DIAGNOSIS — N186 End stage renal disease: Secondary | ICD-10-CM | POA: Diagnosis not present

## 2024-09-07 DIAGNOSIS — L299 Pruritus, unspecified: Secondary | ICD-10-CM | POA: Diagnosis not present

## 2024-09-07 DIAGNOSIS — N2581 Secondary hyperparathyroidism of renal origin: Secondary | ICD-10-CM | POA: Diagnosis not present

## 2024-09-08 ENCOUNTER — Other Ambulatory Visit (HOSPITAL_BASED_OUTPATIENT_CLINIC_OR_DEPARTMENT_OTHER): Payer: Self-pay

## 2024-09-08 ENCOUNTER — Ambulatory Visit (INDEPENDENT_AMBULATORY_CARE_PROVIDER_SITE_OTHER): Admitting: Internal Medicine

## 2024-09-08 ENCOUNTER — Encounter: Payer: Self-pay | Admitting: Internal Medicine

## 2024-09-08 ENCOUNTER — Telehealth: Payer: Self-pay

## 2024-09-08 VITALS — BP 130/66 | HR 79 | Temp 98.0°F | Ht 71.0 in | Wt 359.4 lb

## 2024-09-08 DIAGNOSIS — K7581 Nonalcoholic steatohepatitis (NASH): Secondary | ICD-10-CM

## 2024-09-08 DIAGNOSIS — E039 Hypothyroidism, unspecified: Secondary | ICD-10-CM

## 2024-09-08 DIAGNOSIS — N186 End stage renal disease: Secondary | ICD-10-CM | POA: Diagnosis not present

## 2024-09-08 DIAGNOSIS — R7303 Prediabetes: Secondary | ICD-10-CM

## 2024-09-08 DIAGNOSIS — N390 Urinary tract infection, site not specified: Secondary | ICD-10-CM | POA: Diagnosis not present

## 2024-09-08 DIAGNOSIS — D508 Other iron deficiency anemias: Secondary | ICD-10-CM

## 2024-09-08 DIAGNOSIS — D696 Thrombocytopenia, unspecified: Secondary | ICD-10-CM

## 2024-09-08 DIAGNOSIS — Z951 Presence of aortocoronary bypass graft: Secondary | ICD-10-CM

## 2024-09-08 DIAGNOSIS — K219 Gastro-esophageal reflux disease without esophagitis: Secondary | ICD-10-CM

## 2024-09-08 DIAGNOSIS — D509 Iron deficiency anemia, unspecified: Secondary | ICD-10-CM

## 2024-09-08 LAB — MICROALBUMIN / CREATININE URINE RATIO
Creatinine,U: 126.2 mg/dL
Microalb Creat Ratio: 357.7 mg/g — ABNORMAL HIGH (ref 0.0–30.0)
Microalb, Ur: 45.2 mg/dL — ABNORMAL HIGH (ref 0.0–1.9)

## 2024-09-08 LAB — COMPREHENSIVE METABOLIC PANEL WITH GFR
ALT: 53 U/L (ref 0–53)
AST: 74 U/L — ABNORMAL HIGH (ref 0–37)
Albumin: 3.4 g/dL — ABNORMAL LOW (ref 3.5–5.2)
Alkaline Phosphatase: 297 U/L — ABNORMAL HIGH (ref 39–117)
BUN: 41 mg/dL — ABNORMAL HIGH (ref 6–23)
CO2: 30 meq/L (ref 19–32)
Calcium: 9.2 mg/dL (ref 8.4–10.5)
Chloride: 95 meq/L — ABNORMAL LOW (ref 96–112)
Creatinine, Ser: 7.27 mg/dL (ref 0.40–1.50)
GFR: 8.05 mL/min — CL (ref >60.00)
Glucose, Bld: 135 mg/dL — ABNORMAL HIGH (ref 70–99)
Potassium: 3.2 meq/L — ABNORMAL LOW (ref 3.5–5.1)
Sodium: 138 meq/L (ref 135–145)
Total Bilirubin: 3.1 mg/dL — ABNORMAL HIGH (ref 0.2–1.2)
Total Protein: 6.8 g/dL (ref 6.0–8.3)

## 2024-09-08 LAB — HEMOGLOBIN A1C: Hgb A1c MFr Bld: 4.8 % (ref 4.6–6.5)

## 2024-09-08 LAB — LIPID PANEL
Cholesterol: 195 mg/dL (ref 0–200)
HDL: 40.4 mg/dL (ref >39.00)
LDL Cholesterol: 107 mg/dL — ABNORMAL HIGH (ref 0–99)
NonHDL: 154.64
Total CHOL/HDL Ratio: 5
Triglycerides: 238 mg/dL — ABNORMAL HIGH (ref 0.0–149.0)
VLDL: 47.6 mg/dL — ABNORMAL HIGH (ref 0.0–40.0)

## 2024-09-08 LAB — CBC WITH DIFFERENTIAL/PLATELET
Basophils Absolute: 0 K/uL (ref 0.0–0.1)
Basophils Relative: 0.6 % (ref 0.0–3.0)
Eosinophils Absolute: 0.1 K/uL (ref 0.0–0.7)
Eosinophils Relative: 3.5 % (ref 0.0–5.0)
HCT: 31.5 % — ABNORMAL LOW (ref 39.0–52.0)
Hemoglobin: 10.5 g/dL — ABNORMAL LOW (ref 13.0–17.0)
Lymphocytes Relative: 16.8 % (ref 12.0–46.0)
Lymphs Abs: 0.7 K/uL (ref 0.7–4.0)
MCHC: 33.5 g/dL (ref 30.0–36.0)
MCV: 106 fl — ABNORMAL HIGH (ref 78.0–100.0)
Monocytes Absolute: 0.7 K/uL (ref 0.1–1.0)
Monocytes Relative: 15.5 % — ABNORMAL HIGH (ref 3.0–12.0)
Neutro Abs: 2.7 K/uL (ref 1.4–7.7)
Neutrophils Relative %: 63.6 % (ref 43.0–77.0)
Platelets: 50 K/uL — CL (ref 150.0–400.0)
RBC: 2.97 Mil/uL — ABNORMAL LOW (ref 4.22–5.81)
RDW: 16.4 % — ABNORMAL HIGH (ref 11.5–15.5)
WBC: 4.2 K/uL (ref 4.0–10.5)

## 2024-09-08 MED ORDER — TIRZEPATIDE-WEIGHT MANAGEMENT 2.5 MG/0.5ML ~~LOC~~ SOAJ
2.5000 mg | SUBCUTANEOUS | 11 refills | Status: DC
  Filled 2024-09-08: qty 2, 28d supply, fill #0

## 2024-09-08 NOTE — Assessment & Plan Note (Signed)
 Lab Results  Component Value Date   PLT 50.0 Repeated and verified X2. (LL) 09/08/2024   PLT 51.0 (L) 08/09/2024   PLT 79 (L) 08/05/2024   PLT 122 (L) 08/04/2024   PLT 93 (L) 08/03/2024   PLT 105 (L) 08/02/2024   PLT 58 (L) 07/19/2024   PLT 89.0 (L) 03/14/2024   PLT 70 (L) 01/18/2024   PLT 62 (L) 01/16/2024   PLT 63 (L) 01/15/2024   PLT 66 (L) 01/14/2024   PLT 70 (L) 01/13/2024   PLT 73 (L) 01/12/2024   PLT 64 (L) 01/09/2024   PLT 65 (L) 01/08/2024   PLT 66 (L) 01/05/2024   PLT 70.0 (L) 12/14/2023   PLT 67.0 (L) 12/01/2023   PLT 66.0 (L) 11/09/2023  Low long term although fluctuates over time. Presumably due to severe Nonalcoholic Fatty Liver Disease (NAFLD).

## 2024-09-08 NOTE — Patient Instructions (Addendum)
 Team Member Role and Visual merchandiser Info Address Start End Comments  May, Deanna J, NP Nurse Practitioner (Gastroenterology) Phone: [phone] Fax: [phone] [address] [place] [address] 09/08/2024 - -  Call for appointment about persist gastrointestinal upset.   It was a pleasure seeing you today! Your health and satisfaction are our top priorities.  Bernardino Cone, MD  VISIT SUMMARY: During your visit, we discussed your ongoing gastrointestinal symptoms, financial concerns regarding medication coverage, and several other health issues. We reviewed your current medications and made plans for further evaluation and treatment where necessary.  YOUR PLAN: -END-STAGE RENAL DISEASE ON HEMODIALYSIS: You have end-stage renal disease and are undergoing hemodialysis. The itching you experience is likely due to high levels of waste products in your blood, which are removed during dialysis. We recommend discussing this with your dialysis center and increasing the use of skin moisturizers.  -CHRONIC IRON DEFICIENCY ANEMIA SECONDARY TO ESRD: Your chronic iron deficiency anemia is related to your kidney disease. This is managed with iron supplements during dialysis. We will check your iron levels in today's blood work.  -GASTROESOPHAGEAL REFLUX DISEASE WITH POSSIBLE UPPER GI BLEEDING: You have gastroesophageal reflux disease (GERD) and have experienced recent indigestion and vomiting, with possible signs of upper gastrointestinal bleeding. Continue taking Voquezna  20 mg for 20 days. We will refer you to a GI specialist for further evaluation and consider additional procedures if bleeding persists.  -RECURRENT URINARY TRACT INFECTION: You recently had a urinary tract infection that was treated with antibiotics. We will perform a urine analysis to confirm that the infection has resolved.  -CORONARY ARTERY DISEASE POST-CABG: You have a history of coronary artery disease and have undergone  coronary artery bypass grafting (CABG). We are working on obtaining medication to help with weight loss and reduce your cardiovascular risk. We will submit a request for Zepbound  to your insurance and await blood work results to support this request.  -SUSPECTED DIABETES MELLITUS: There is a suspicion of diabetes due to previous borderline results. We will perform a finger stick A1c test to check your blood sugar levels.  INSTRUCTIONS: Please follow up with the GI specialist Deanna May for further evaluation of your GERD and possible upper GI bleeding. Continue taking Voquezna  20 mg for 20 days. Discuss your pruritus with the dialysis center and increase the use of skin moisturizers. We will check your iron levels and perform a urine analysis today. We will also perform a finger stick A1c test to check for diabetes. Await the results of your blood work to support the insurance appeal for Zepbound .  Your Providers PCP: Cone Bernardino MATSU, MD,  [phone]) Referring Provider: Cone Bernardino MATSU, MD,  [phone]) Care Team Provider: Santo Stanly LABOR, MD,  [phone]) Care Team Provider: Alvia Olam BIRCH, RN Care Team Provider: Ezzard Valaria LABOR, MD,  [phone]) Care Team Provider: Tobie Gordy POUR, MD,  [phone]) Care Team Provider: [facility],  [phone]) Care Team Provider: May, Deanna J, NP,  [phone])  NEXT STEPS: [x]  Early Intervention: Schedule sooner appointment, call our on-call services, or go to emergency room if there is any significant Increase in pain or discomfort New or worsening symptoms Sudden or severe changes in your health [x]  Flexible Follow-Up: We recommend a Return in about 1 month (around 10/09/2024). for optimal routine care. This allows for progress monitoring and treatment adjustments. [x]  Preventive Care: Schedule your annual preventive care visit! It's typically covered by insurance and helps identify potential health issues  early. [x]  Lab &  X-ray Appointments: Incomplete tests scheduled today, or call to schedule. X-rays: [facility] (M-F, 8:30am-noon or 1pm-5pm). [x]  Medical Information Release: Sign a release form at front desk to obtain relevant medical information we don't have.  MAKING THE MOST OF OUR FOCUSED 20 MINUTE APPOINTMENTS: [x]   Clearly state your top concerns at the beginning of the visit to focus our discussion [x]   If you anticipate you will need more time, please inform the front desk during scheduling - we can book multiple appointments in the same week. [x]   If you have transportation problems- use our convenient video appointments or ask about transportation support. [x]   We can get down to business faster if you use MyChart to update information before the visit and submit non-urgent questions before your visit. Thank you for taking the time to provide details through MyChart.  Let our nurse know and she can import this information into your encounter documents.  Arrival and Wait Times: [x]   Arriving on time ensures that everyone receives prompt attention. [x]   Early morning (8a) and afternoon (1p) appointments tend to have shortest wait times. [x]   Unfortunately, we cannot delay appointments for late arrivals or hold slots during phone calls.  Getting Answers and Following Up [x]   Simple Questions & Concerns: For quick questions or basic follow-up after your visit, reach us  at [phone] or MyChart messaging. [x]   Complex Concerns: If your concern is more complex, scheduling an appointment might be best. Discuss this with the staff to find the most suitable option. [x]   Lab & Imaging Results: We'll contact you directly if results are abnormal or you don't use MyChart. Most normal results will be on MyChart within 2-3 business days, with a review message from Dr. Jesus. Haven't heard back in 2 weeks? Need results sooner? Contact us  at [phone]. [x]   Referrals: Our  referral coordinator will manage specialist referrals. The specialist's office should contact you within 2 weeks to schedule an appointment. Call us  if you haven't heard from them after 2 weeks.  Staying Connected [x]   MyChart: Activate your MyChart for the fastest way to access results and message us . See the last page of this paperwork for instructions on how to activate.  Bring to Your Next Appointment [x]   Medications: Please bring all your medication bottles to your next appointment to ensure we have an accurate record of your prescriptions. [x]   Health Diaries: If you're monitoring any health conditions at home, keeping a diary of your readings can be very helpful for discussions at your next appointment.  Billing [x]   X-ray & Lab Orders: These are billed by separate companies. Contact the invoicing company directly for questions or concerns. [x]   Visit Charges: Discuss any billing inquiries with our administrative services team.  Your Satisfaction Matters [x]   Share Your Experience: We strive for your satisfaction! If you have any complaints, or preferably compliments, please let Dr. Jesus know directly or contact our Practice Administrators, Manuelita Rubin or [employer], by asking at the front desk.   Reviewing Your Records [x]   Review this early draft of your clinical encounter notes below and the final encounter summary tomorrow on MyChart after its been completed.  All orders placed so far are visible here: Prediabetes -     Urinalysis w microscopic + reflex cultur -     CBC with Differential/Platelet -     Comprehensive metabolic panel with GFR -     Lipid panel -  Hemoglobin A1c -     Microalbumin / creatinine urine ratio -     Tirzepatide -Weight Management; Inject 2.5 mg into the skin once a week.  Dispense: 2 mL; Refill: 11 -     TSH + free T4 -     Iron, TIBC and Ferritin Panel  ESRD (end stage renal disease) (HCC) -     Lipid panel -     Tirzepatide -Weight  Management; Inject 2.5 mg into the skin once a week.  Dispense: 2 mL; Refill: 11  Chronic iron deficiency anemia -     CBC with Differential/Platelet -     Comprehensive metabolic panel with GFR -     Iron, TIBC and Ferritin Panel  Gastroesophageal reflux disease, unspecified whether esophagitis present  Urinary tract infection without hematuria, site unspecified -     Urinalysis w microscopic + reflex cultur -     Microalbumin / creatinine urine ratio  Hx of CABG -     Lipid panel -     Tirzepatide -Weight Management; Inject 2.5 mg into the skin once a week.  Dispense: 2 mL; Refill: 11  Thrombocytopenia -     Tirzepatide -Weight Management; Inject 2.5 mg into the skin once a week.  Dispense: 2 mL; Refill: 11  Other iron deficiency anemia  Metabolic dysfunction-associated steatohepatitis (MASH) -     Lipid panel -     Tirzepatide -Weight Management; Inject 2.5 mg into the skin once a week.  Dispense: 2 mL; Refill: 11  Hypothyroidism, unspecified type -     TSH + free T4

## 2024-09-08 NOTE — Progress Notes (Signed)
 ==============================  [facility]: [phone]   -- Medical Office Visit --  Patient: Sean [name]      Age: 54 y.o.       Sex:  male  Date:   09/08/2024 Today's Healthcare Provider: Bernardino KANDICE Cone, MD  ==============================   Chief Complaint: Nausea and Follow up uti (Pt has one more abx left and is done states feeling better.)   Discussed the use of AI scribe software for clinical note transcription with the patient, who gave verbal consent to proceed.  History of Present Illness 53 year old male who presents with gastrointestinal symptoms and financial concerns regarding medication coverage.  He has ongoing financial concerns related to his healthcare coverage. He is currently on [employer] Blue [employer], which costs $260 a month and covers all his medications and visits, including dialysis. Medicare would cost him $186 a month for medications, but he is not eligible for Medicaid due to his disability income being slightly over the limit. He is concerned about losing his Blue [employer] Blue Shield coverage in two years and having to switch to [employer].  He experiences persistent itching and applies lotion in the morning, but after dialysis, his skin appears dry again. He applies lotion in the morning, but after dialysis, his skin appears dry again.  He has a history of gastrointestinal issues, including recent episodes of indigestion and vomiting. He wakes up with bad indigestion and vomiting, noting 'three little spots of solid black' in his vomit, which he describes as resembling blood. He associates this with a recent dental cleaning where he experienced gum bleeding. He is currently taking Voquezna , with a recent increase to 20 mg due to worsening symptoms.  He has a history of UTIs and was recently treated with antibiotics for a suspected UTI.  He mentions a past incident where his arm was damaged during a hospital stay,  resulting in significant bruising and swelling, which affected his dialysis fistula. The fistula is now being used again for dialysis.  He is currently taking Repatha , which initially cost him out-of-pocket but is now covered after meeting his deductible. He also mentions taking iron supplements post-hospitalization, except for the current week.  He is concerned about potential diabetes, as he was previously noted to be leaning towards it. He is open to having his blood sugar levels checked.  Background Reviewed: Problem List: has History of umbilical hernia repair; Hypertension; Hyperlipidemia; Thrombocytopenia; Sinus bradycardia; Atherosclerotic heart disease of native coronary artery without angina pectoris; Atrial flutter with rapid ventricular response (HCC); Coronary artery disease involving native coronary artery of native heart without angina pectoris; Other long term (current) drug therapy; History of coronary artery bypass surgery; Obstructive sleep apnea syndrome; Chronic gout of multiple sites; Alkaline phosphatase elevation; Morbid obesity with BMI of 50.0-59.9, adult (HCC); Gout; Ectatic aorta; Gastroesophageal reflux disease without esophagitis; Herpes simplex; Metabolic dysfunction-associated steatohepatitis (MASH); Prediabetes; Statins contraindicated; Hypothyroid; Chronic venous stasis dermatitis of both lower extremities; Chronic nausea; Chronic liver failure without hepatic coma (HCC); Splenomegaly; Spondylolisthesis; Bleeding diathesis; Malaise and fatigue; Allergic rhinitis due to pollen; Amitriptyline  adverse reaction; MGUS (monoclonal gammopathy of unknown significance); 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; Rectal bleeding; Recurrent sinusitis; Asthma, chronic; ESRD (end stage renal disease) (HCC); Atrial fibrillation (HCC); PUD (peptic ulcer disease); Disability affecting daily living; Inflamed internal hemorrhoid; Anemia in chronic kidney disease; Chronic diastolic CHF  (congestive heart failure) (HCC); Coagulation defect, unspecified; Dependence on renal dialysis; Gout due to renal impairment,  unspecified site; Hypertensive chronic kidney disease with stage 5 chronic kidney disease or end stage renal disease (HCC); Mild protein-calorie malnutrition; Other chronic pain; Other disorders of phosphorus metabolism; Other fluid overload; Iron deficiency anemia, unspecified; Other iron deficiency anemias; Pruritus, unspecified; Pure hypercholesterolemia, unspecified; Secondary hyperparathyroidism of renal origin; Shortness of breath; Abnormal bowel habits; Inadequate material resources; Decompensated cirrhosis (HCC); Acute hepatic encephalopathy (HCC); UTI (urinary tract infection); and Consult on their problem list. Past Medical History:  has a past medical history of Abnormal liver enzymes, Acute renal failure superimposed on stage 5 chronic kidney disease, not on chronic dialysis (HCC) (01/08/2024), AKI (acute kidney injury) (06/17/2023), Atrial fibrillation (HCC), Cardiac cirrhosis, Chronic combined systolic and diastolic CHF (congestive heart failure) (HCC), Chronic kidney disease, stage 4 (severe) (HCC) (01/04/2016), CKD (chronic kidney disease) stage 3, GFR 30-59 ml/min (HCC) (01/04/2016), CKD (chronic kidney disease), stage II, Congenital heart defect, Coronary artery disease, Dyspnea, Gastric polyps (11/25/2022), Gastritis and gastroduodenitis (11/25/2022), Gastroesophageal reflux disease with esophagitis and hemorrhage (04/04/2021), Hematuria, History of umbilical hernia repair (07/10/2011), Hypercholesteremia, Hypertension, Incisional hernia (07/10/2011), Morbid obesity (HCC), Morbid obesity with BMI of 50.0-59.9, adult (HCC) (01/04/2016), Nausea and vomiting (11/25/2022), OSA on CPAP, Paroxysmal atrial flutter (HCC) (02/12/2016), PONV (postoperative nausea and vomiting), 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 Past Surgical History:    has a past surgical history that includes Gallbladder surgery; Appendectomy; Hernia repair; Cardiac catheterization (N/A, 12/24/2015); Coronary artery bypass graft (N/A, 12/26/2015); TEE without cardioversion (N/A, 12/26/2015); Cardioversion (N/A, 02/14/2016); TEE without cardioversion (N/A, 02/14/2016); LEFT HEART CATH AND CORS/GRAFTS ANGIOGRAPHY (N/A, 04/15/2017); LEFT HEART CATH AND CORS/GRAFTS ANGIOGRAPHY (N/A, 11/02/2020); Colonoscopy with propofol  (N/A, 02/19/2021); Esophagogastroduodenoscopy (egd) with propofol  (N/A, 02/19/2021); biopsy (02/19/2021); polypectomy (02/19/2021); Esophagogastroduodenoscopy (egd) with propofol  (N/A, 11/25/2022); biopsy (11/25/2022); IR Transcatheter BX (06/26/2023); IR US  Guide Vasc Access Right (06/26/2023); IR Venogram Hepatic W Hemodynamic Evaluation (06/26/2023); AV fistula placement (Left, 01/11/2024); Insertion of dialysis catheter (Right, 01/11/2024); Fistula superficialization (Left, 05/06/2024); and Ligation of competing branches of arteriovenous fistula (Left, 05/06/2024). Social History:   reports that he has never smoked. He has never used smokeless tobacco. He reports that he does not currently use alcohol. He reports that he does not use drugs. Family History:  family history includes CAD in his father; Diabetes in his father, maternal grandfather, and maternal uncle; Heart attack in an other family member; Hyperlipidemia in an other family member. Allergies:  is allergic to glucophage  [metformin ], vibra -tab [doxycycline ], chlorhexidine , chlorhexidine  gluconate, augmentin [amoxicillin-pot clavulanate], imdur  [isosorbide  dinitrate], tape, and valacyclovir .   Medication Reconciliation: Current Outpatient Medications on File Prior to Visit  Medication Sig   allopurinol  (ZYLOPRIM ) 100 MG tablet TAKE 1 TABLET BY MOUTH EVERY DAY   amitriptyline  (ELAVIL ) 25 MG tablet TAKE 1 TABLET BY MOUTH EVERYDAY AT BEDTIME   azelastine  (ASTELIN ) 0.1 % nasal spray Place 2 sprays into both nostrils  2 (two) times daily. Use in each nostril as directed   B Complex-C-Folic Acid  (DIALYVITE  TABLET) TABS Take 1 tablet by mouth daily.   Cyanocobalamin  (VITAMIN B-12) 5000 MCG SUBL Place 5,000 mcg under the tongue daily.   diltiazem  (CARDIZEM  SR) 120 MG 12 hr capsule Take 1 capsule (120 mg total) by mouth 2 (two) times daily.   ELIQUIS  5 MG TABS tablet TAKE 1 TABLET BY MOUTH TWICE A DAY   Evolocumab  (REPATHA ) 140 MG/ML SOSY Inject 140 mg into the skin every 14 (fourteen) days.   folic acid -vitamin b complex-vitamin c-selenium-zinc (DIALYVITE ) 3 MG TABS tablet Take 1 tablet by  mouth daily.   hydrocortisone  (ANUSOL -HC) 25 MG suppository Place 1 suppository (25 mg total) rectally 2 (two) times daily.   lanthanum  (FOSRENOL ) 1000 MG chewable tablet Chew 1,000 mg by mouth 3 (three) times daily with meals.   Methoxy PEG-Epoetin Beta (MIRCERA IJ) 75 mcg.   montelukast  (SINGULAIR ) 10 MG tablet TAKE 1 TABLET BY MOUTH EVERY DAY   mupirocin  ointment (BACTROBAN ) 2 % Apply to inner nares twice daily   nitroGLYCERIN  (NITROSTAT ) 0.4 MG SL tablet Place 1 tablet (0.4 mg total) under the tongue every 5 (five) minutes as needed for chest pain.   ondansetron  (ZOFRAN -ODT) 4 MG disintegrating tablet Take 1 tablet (4 mg total) by mouth every 8 (eight) hours as needed for nausea or vomiting.   oxyCODONE -acetaminophen  (PERCOCET) 5-325 MG tablet Take 1 tablet by mouth every 6 (six) hours as needed for severe pain (pain score 7-10).   polyethylene glycol (MIRALAX  / GLYCOLAX ) 17 g packet Take 17 g by mouth daily as needed. Titrate based on the stool frequency. Goal :2-3 BMs a day,   promethazine  (PHENERGAN ) 25 MG tablet Take 1 tablet (25 mg total) by mouth every 8 (eight) hours as needed for nausea or vomiting.   rifaximin  (XIFAXAN ) 550 MG TABS tablet Take 1 tablet (550 mg total) by mouth 2 (two) times daily.   sucralfate  (CARAFATE ) 1 g tablet Take 1 g by mouth at bedtime.   sulfamethoxazole -trimethoprim  (BACTRIM  DS) 800-160 MG  tablet Take 1 tablet by mouth daily. Take after dialysis on dialysis days   torsemide  (DEMADEX ) 10 MG tablet Take 10 mg by mouth daily. (Patient taking differently: Take 10 mg by mouth daily. Taking prn)   Vonoprazan Fumarate  (VOQUEZNA ) 20 MG TABS Take by mouth.   lactulose  (CHRONULAC ) 10 GM/15ML solution Take 45 mLs (30 g total) by mouth 4 (four) times daily as needed (to acheive goal of 2-3 BMs daily). Titrate based on the stool frequency.   lactulose , encephalopathy, (CHRONULAC ) 10 GM/15ML SOLN Take 30 mLs by mouth in the morning and at bedtime.   losartan  (COZAAR ) 50 MG tablet Take 1 tablet (50 mg total) by mouth daily. (Patient not taking: Reported on 09/08/2024)   Current Facility-Administered Medications on File Prior to Visit  Medication   technetium tetrofosmin  (TC-MYOVIEW ) injection 29.8 millicurie  There are no discontinued medications.   Physical Exam:    09/08/2024   10:55 AM 09/01/2024   11:35 AM 09/01/2024   11:33 AM  Vitals with BMI  Height 5' 11  5' 11  Weight 359 lbs 6 oz  357 lbs 13 oz  BMI 50.15  49.93  Systolic 130 125 873  Diastolic 66 65 92  Pulse 79  66  Vital signs reviewed.  Nursing notes reviewed. Weight trend reviewed. Physical Activity: Not on file   General Appearance:  No acute distress appreciable.   Well-groomed, healthy-appearing male.  Well proportioned with no abnormal fat distribution.  Good muscle tone. Pulmonary:  Normal work of breathing at rest, no respiratory distress apparent. SpO2: 98 %  Musculoskeletal: All extremities are intact.  Neurological:  Awake, alert, oriented, and engaged.  No obvious focal neurological deficits or cognitive impairments.  Sensorium seems unclouded.   Speech is clear and coherent with logical content. Psychiatric:  Appropriate mood, pleasant and cooperative demeanor, thoughtful and engaged during the exam   Verbalized to patient: Physical Exam    Results:   Verbalized to patient: Results       09/01/2024   11:32 AM 08/08/2024   10:59  AM 07/26/2024   10:00 AM 04/22/2024    9:21 AM  PHQ 2/9 Scores  PHQ - 2 Score 0 0 0 0  PHQ- 9 Score    0    Office Visit on 09/08/2024  Component Date Value Ref Range Status   WBC 09/08/2024 4.2  4.0 - 10.5 K/uL Final   RBC 09/08/2024 2.97 (L)  4.22 - 5.81 Mil/uL Final   Hemoglobin 09/08/2024 10.5 (L)  13.0 - 17.0 g/dL Final   HCT 89/90/7974 31.5 (L)  39.0 - 52.0 % Final   MCV 09/08/2024 106.0 (H)  78.0 - 100.0 fl Final   MCHC 09/08/2024 33.5  30.0 - 36.0 g/dL Final   RDW 89/90/7974 16.4 (H)  11.5 - 15.5 % Final   Platelets 09/08/2024 50.0 Repeated and verified X2. (LL)  150.0 - 400.0 K/uL Final   Neutrophils Relative % 09/08/2024 63.6  43.0 - 77.0 % Final   Lymphocytes Relative 09/08/2024 16.8  12.0 - 46.0 % Final   Monocytes Relative 09/08/2024 15.5 (H)  3.0 - 12.0 % Final   Eosinophils Relative 09/08/2024 3.5  0.0 - 5.0 % Final   Basophils Relative 09/08/2024 0.6  0.0 - 3.0 % Final   Neutro Abs 09/08/2024 2.7  1.4 - 7.7 K/uL Final   Lymphs Abs 09/08/2024 0.7  0.7 - 4.0 K/uL Final   Monocytes Absolute 09/08/2024 0.7  0.1 - 1.0 K/uL Final   Eosinophils Absolute 09/08/2024 0.1  0.0 - 0.7 K/uL Final   Basophils Absolute 09/08/2024 0.0  0.0 - 0.1 K/uL Final   Sodium 09/08/2024 138  135 - 145 mEq/L Final   Potassium 09/08/2024 3.2 (L)  3.5 - 5.1 mEq/L Final   Chloride 09/08/2024 95 (L)  96 - 112 mEq/L Final   CO2 09/08/2024 30  19 - 32 mEq/L Final   Glucose, Bld 09/08/2024 135 (H)  70 - 99 mg/dL Final   BUN 89/90/7974 41 (H)  6 - 23 mg/dL Final   Creatinine, Ser 09/08/2024 7.27 (HH)  0.40 - 1.50 mg/dL Final   Total Bilirubin 09/08/2024 3.1 (H)  0.2 - 1.2 mg/dL Final   Alkaline Phosphatase 09/08/2024 297 (H)  39 - 117 U/L Final   AST 09/08/2024 74 (H)  0 - 37 U/L Final   ALT 09/08/2024 53  0 - 53 U/L Final   Total Protein 09/08/2024 6.8  6.0 - 8.3 g/dL Final   Albumin  09/08/2024 3.4 (L)  3.5 - 5.2 g/dL Final   GFR 89/90/7974 8.05 (LL)  >60.00  mL/min Final   Calcium  09/08/2024 9.2  8.4 - 10.5 mg/dL Final   Cholesterol 89/90/7974 195  0 - 200 mg/dL Final   Triglycerides 89/90/7974 238.0 (H)  0.0 - 149.0 mg/dL Final   HDL 89/90/7974 40.40  >39.00 mg/dL Final   VLDL 89/90/7974 47.6 (H)  0.0 - 40.0 mg/dL Final   LDL Cholesterol 09/08/2024 107 (H)  0 - 99 mg/dL Final   Total CHOL/HDL Ratio 09/08/2024 5   Final   NonHDL 09/08/2024 154.64   Final   Hgb A1c MFr Bld 09/08/2024 4.8  4.6 - 6.5 % Final   Microalb, Ur 09/08/2024 45.2 (H)  0.0 - 1.9 mg/dL Final   Creatinine,U 89/90/7974 126.2  mg/dL Final   Microalb Creat Ratio 09/08/2024 357.7 (H)  0.0 - 30.0 mg/g Final  Office Visit on 09/01/2024  Component Date Value Ref Range Status   Color, UA 09/01/2024 dark yellow   Final   Clarity, UA 09/01/2024 cloudy  Final   Glucose, UA 09/01/2024 Positive (A)  Negative Final   Bilirubin, UA 09/01/2024 1 (A)   Final   Ketones, UA 09/01/2024 negative   Final   Spec Grav, UA 09/01/2024 1.025  1.010 - 1.025 Final   Blood, UA 09/01/2024 3 (AA)   Final   pH, UA 09/01/2024 5.5  5.0 - 8.0 Final   Protein, UA 09/01/2024 Negative  Negative Final   Urobilinogen, UA 09/01/2024 0.2  0.2 or 1.0 E.U./dL Final   Nitrite, UA 89/97/7974 negative   Final   Leukocytes, UA 09/01/2024 Small (1+) (A)  Negative Final   MICRO NUMBER: 09/01/2024 [id number]   Final   SPECIMEN QUALITY: 09/01/2024 Adequate   Final   Sample Source 09/01/2024 URINE   Final   STATUS: 09/01/2024 FINAL   Final   Result: 09/01/2024    Final                   Value:Mixed genital flora isolated. These superficial bacteria are not indicative of a urinary tract infection. No further organism identification is warranted on this specimen. If clinically indicated, recollect clean-catch, mid-stream urine and transfer  immediately to Urine Culture Transport Tube.    WBC, UA 09/01/2024 21-50/hpf (A)  0-2/hpf Final   RBC / HPF 09/01/2024 21-50/hpf (A)  0-2/hpf Final   Squamous Epithelial / HPF  09/01/2024 Rare(0-4/hpf)  Rare(0-4/hpf) Final   Bacteria, UA 09/01/2024 Rare(<10/hpf) (A)  None Final  Office Visit on 08/09/2024  Component Date Value Ref Range Status   Color, Urine 08/09/2024 Dark Yellow (A)  Yellow;Lt. Yellow;Straw;Dark Yellow;Amber;Green;Red;Brown Final   APPearance 08/09/2024 SL CLOUDY (A)  Clear;Turbid;Slightly Cloudy;Cloudy Final   Specific Gravity, Urine 08/09/2024 >=1.030 (A)  1.000 - 1.030 Final   pH 08/09/2024 5.0  5.0 - 8.0 Final   Total Protein, Urine 08/09/2024 100 (A)  Negative Final   Urine Glucose 08/09/2024 100 (A)  Negative Final   Ketones, ur 08/09/2024 TRACE (A)  Negative Final   Bilirubin Urine 08/09/2024 MODERATE (A)  Negative Final   Hgb urine dipstick 08/09/2024 TRACE-LYSED (A)  Negative Final   Urobilinogen, UA 08/09/2024 4.0 (A)  0.0 - 1.0 Final   Leukocytes,Ua 08/09/2024 NEGATIVE  Negative Final   Nitrite 08/09/2024 NEGATIVE  Negative Final   WBC, UA 08/09/2024 3-6/hpf (A)  0-2/hpf Final   RBC / HPF 08/09/2024 0-2/hpf  0-2/hpf Final   Mucus, UA 08/09/2024 Presence of (A)  None Final   Squamous Epithelial / HPF 08/09/2024 Many(>10/hpf) (A)  Rare(0-4/hpf) Final   Bacteria, UA 08/09/2024 Few(10-50/hpf) (A)  None Final   Granular Casts, UA 08/09/2024 Presence of (A)  None Final   Hyaline Casts, UA 08/09/2024 Presence of (A)  None Final   MICRO NUMBER: 08/09/2024 [id number]   Final   SPECIMEN QUALITY: 08/09/2024 Adequate   Final   Sample Source 08/09/2024 URINE   Final   STATUS: 08/09/2024 FINAL   Final   Result: 08/09/2024 Less than 10,000 CFU/mL of single Gram positive organism isolated. No further testing will be performed. If clinically indicated, recollection using a method to minimize contamination, with prompt transfer to Urine Culture Transport Tube, is recommended.   Final   WBC 08/09/2024 5.3  4.0 - 10.5 K/uL Final   RBC 08/09/2024 2.76 (L)  4.22 - 5.81 Mil/uL Final   Hemoglobin 08/09/2024 9.9 (L)  13.0 - 17.0 g/dL Final   HCT 90/90/7974  29.2 (L)  39.0 - 52.0 % Final   MCV 08/09/2024 105.9 (H)  78.0 -  100.0 fl Final   MCHC 08/09/2024 33.9  30.0 - 36.0 g/dL Final   RDW 90/90/7974 15.5  11.5 - 15.5 % Final   Platelets 08/09/2024 51.0 (L)  150.0 - 400.0 K/uL Final   Neutrophils Relative % 08/09/2024 51.9  43.0 - 77.0 % Final   Lymphocytes Relative 08/09/2024 18.0  12.0 - 46.0 % Final   Monocytes Relative 08/09/2024 20.8 Repeated and verified X2. (H)  3.0 - 12.0 % Final   Eosinophils Relative 08/09/2024 8.7 (H)  0.0 - 5.0 % Final   Basophils Relative 08/09/2024 0.6  0.0 - 3.0 % Final   Neutro Abs 08/09/2024 2.7  1.4 - 7.7 K/uL Final   Lymphs Abs 08/09/2024 1.0  0.7 - 4.0 K/uL Final   Monocytes Absolute 08/09/2024 1.1 (H)  0.1 - 1.0 K/uL Final   Eosinophils Absolute 08/09/2024 0.5  0.0 - 0.7 K/uL Final   Basophils Absolute 08/09/2024 0.0  0.0 - 0.1 K/uL Final   Sodium 08/09/2024 139  135 - 145 mEq/L Final   Potassium 08/09/2024 3.7  3.5 - 5.1 mEq/L Final   Chloride 08/09/2024 98  96 - 112 mEq/L Final   CO2 08/09/2024 27  19 - 32 mEq/L Final   Glucose, Bld 08/09/2024 93  70 - 99 mg/dL Final   BUN 90/90/7974 50 (H)  6 - 23 mg/dL Final   Creatinine, Ser 08/09/2024 9.15 (HH)  0.40 - 1.50 mg/dL Final   Total Bilirubin 08/09/2024 5.5 (H)  0.2 - 1.2 mg/dL Final   Alkaline Phosphatase 08/09/2024 322 (H)  39 - 117 U/L Final   AST 08/09/2024 62 (H)  0 - 37 U/L Final   ALT 08/09/2024 57 (H)  0 - 53 U/L Final   Total Protein 08/09/2024 6.5  6.0 - 8.3 g/dL Final   Albumin  08/09/2024 3.2 (L)  3.5 - 5.2 g/dL Final   GFR 90/90/7974 6.11 (LL)  >60.00 mL/min Final   Calcium  08/09/2024 9.1  8.4 - 10.5 mg/dL Final  Admission on 90/97/7974, Discharged on 08/06/2024  Component Date Value Ref Range Status   Sodium 08/02/2024 134 (L)  135 - 145 mmol/L Final   Potassium 08/02/2024 2.9 (L)  3.5 - 5.1 mmol/L Final   Chloride 08/02/2024 96 (L)  98 - 111 mmol/L Final   CO2 08/02/2024 22  22 - 32 mmol/L Final   Glucose, Bld 08/02/2024 154 (H)  70 -  99 mg/dL Final   BUN 90/97/7974 53 (H)  6 - 20 mg/dL Final   Creatinine, Ser 08/02/2024 6.94 (H)  0.61 - 1.24 mg/dL Final   Calcium  08/02/2024 10.2  8.9 - 10.3 mg/dL Final   Total Protein 90/97/7974 7.1  6.5 - 8.1 g/dL Final   Albumin  08/02/2024 3.1 (L)  3.5 - 5.0 g/dL Final   AST 90/97/7974 75 (H)  15 - 41 U/L Final   ALT 08/02/2024 56 (H)  0 - 44 U/L Final   Alkaline Phosphatase 08/02/2024 217 (H)  38 - 126 U/L Final   Total Bilirubin 08/02/2024 2.2 (H)  0.0 - 1.2 mg/dL Final   GFR, Estimated 08/02/2024 9 (L)  >60 mL/min Final   Anion gap 08/02/2024 16 (H)  5 - 15 Final   WBC 08/02/2024 9.0  4.0 - 10.5 K/uL Final   RBC 08/02/2024 3.04 (L)  4.22 - 5.81 MIL/uL Final   Hemoglobin 08/02/2024 10.7 (L)  13.0 - 17.0 g/dL Final   HCT 90/97/7974 32.1 (L)  39.0 - 52.0 % Final   MCV 08/02/2024  105.6 (H)  80.0 - 100.0 fL Final   MCH 08/02/2024 35.2 (H)  26.0 - 34.0 pg Final   MCHC 08/02/2024 33.3  30.0 - 36.0 g/dL Final   RDW 90/97/7974 15.1  11.5 - 15.5 % Final   Platelets 08/02/2024 105 (L)  150 - 400 K/uL Final   nRBC 08/02/2024 0.0  0.0 - 0.2 % Final   Color, Urine 08/02/2024 YELLOW  YELLOW Final   APPearance 08/02/2024 HAZY (A)  CLEAR Final   Specific Gravity, Urine 08/02/2024 1.009  1.005 - 1.030 Final   pH 08/02/2024 5.0  5.0 - 8.0 Final   Glucose, UA 08/02/2024 NEGATIVE  NEGATIVE mg/dL Final   Hgb urine dipstick 08/02/2024 MODERATE (A)  NEGATIVE Final   Bilirubin Urine 08/02/2024 NEGATIVE  NEGATIVE Final   Ketones, ur 08/02/2024 NEGATIVE  NEGATIVE mg/dL Final   Protein, ur 90/97/7974 100 (A)  NEGATIVE mg/dL Final   Nitrite 90/97/7974 NEGATIVE  NEGATIVE Final   Leukocytes,Ua 08/02/2024 MODERATE (A)  NEGATIVE Final   RBC / HPF 08/02/2024 0-5  0 - 5 RBC/hpf Final   WBC, UA 08/02/2024 21-50  0 - 5 WBC/hpf Final   Bacteria, UA 08/02/2024 MANY (A)  NONE SEEN Final   Squamous Epithelial / HPF 08/02/2024 0-5  0 - 5 /HPF Final   WBC Clumps 08/02/2024 PRESENT   Final   Ammonia 08/02/2024  151 (H)  9 - 35 umol/L Final   Sodium 08/02/2024 135  135 - 145 mmol/L Final   Potassium 08/02/2024 2.9 (L)  3.5 - 5.1 mmol/L Final   Chloride 08/02/2024 98  98 - 111 mmol/L Final   BUN 08/02/2024 48 (H)  6 - 20 mg/dL Final   Creatinine, Ser 08/02/2024 6.70 (H)  0.61 - 1.24 mg/dL Final   Glucose, Bld 90/97/7974 151 (H)  70 - 99 mg/dL Final   Calcium , Ion 08/02/2024 1.16  1.15 - 1.40 mmol/L Final   TCO2 08/02/2024 22  22 - 32 mmol/L Final   Hemoglobin 08/02/2024 11.2 (L)  13.0 - 17.0 g/dL Final   HCT 90/97/7974 33.0 (L)  39.0 - 52.0 % Final   Opiates 08/02/2024 NONE DETECTED  NONE DETECTED Final   Cocaine 08/02/2024 NONE DETECTED  NONE DETECTED Final   Benzodiazepines 08/02/2024 NONE DETECTED  NONE DETECTED Final   Amphetamines 08/02/2024 NONE DETECTED  NONE DETECTED Final   Tetrahydrocannabinol 08/02/2024 NONE DETECTED  NONE DETECTED Final   Barbiturates 08/02/2024 NONE DETECTED  NONE DETECTED Final   Alcohol, Ethyl (B) 08/02/2024 <15  <15 mg/dL Final   Prothrombin Time 08/02/2024 19.1 (H)  11.4 - 15.2 seconds Final   INR 08/02/2024 1.5 (H)  0.8 - 1.2 Final   Specimen Description 08/03/2024 BLOOD RIGHT ANTECUBITAL   Final   Special Requests 08/03/2024 BOTTLES DRAWN AEROBIC AND ANAEROBIC Blood Culture adequate volume   Final   Culture  Setup Time 08/03/2024    Final                   Value:GRAM POSITIVE RODS ANAEROBIC BOTTLE ONLY    Culture 08/03/2024  (A)   Final                   Value:CUTIBACTERIUM ACNES Standardized susceptibility testing for this organism is not available. Performed at [facility], [address], [place], [place] [address]    Report Status 08/03/2024 08/08/2024 FINAL   Final   Specimen Description 08/03/2024 BLOOD RIGHT ANTECUBITAL   Final   Special Requests  08/03/2024 BOTTLES DRAWN AEROBIC AND ANAEROBIC Blood Culture adequate volume   Final   Culture 08/03/2024    Final                   Value:NO GROWTH 5 DAYS Performed at [facility],  [address] [place], [place] [address]    Report Status 08/03/2024 08/08/2024 FINAL   Final   Sodium 08/03/2024 134 (L)  135 - 145 mmol/L Final   Potassium 08/03/2024 3.1 (L)  3.5 - 5.1 mmol/L Final   Chloride 08/03/2024 95 (L)  98 - 111 mmol/L Final   CO2 08/03/2024 22  22 - 32 mmol/L Final   Glucose, Bld 08/03/2024 150 (H)  70 - 99 mg/dL Final   BUN 90/96/7974 57 (H)  6 - 20 mg/dL Final   Creatinine, Ser 08/03/2024 7.20 (H)  0.61 - 1.24 mg/dL Final   Calcium  08/03/2024 9.9  8.9 - 10.3 mg/dL Final   GFR, Estimated 08/03/2024 9 (L)  >60 mL/min Final   Anion gap 08/03/2024 17 (H)  5 - 15 Final   WBC 08/03/2024 7.7  4.0 - 10.5 K/uL Final   RBC 08/03/2024 2.99 (L)  4.22 - 5.81 MIL/uL Final   Hemoglobin 08/03/2024 10.8 (L)  13.0 - 17.0 g/dL Final   HCT 90/96/7974 31.4 (L)  39.0 - 52.0 % Final   MCV 08/03/2024 105.0 (H)  80.0 - 100.0 fL Final   MCH 08/03/2024 36.1 (H)  26.0 - 34.0 pg Final   MCHC 08/03/2024 34.4  30.0 - 36.0 g/dL Final   RDW 90/96/7974 15.1  11.5 - 15.5 % Final   Platelets 08/03/2024 93 (L)  150 - 400 K/uL Final   nRBC 08/03/2024 0.0  0.0 - 0.2 % Final   Magnesium  08/03/2024 2.3  1.7 - 2.4 mg/dL Final   Phosphorus 90/96/7974 6.8 (H)  2.5 - 4.6 mg/dL Final   Total Protein 90/96/7974 6.8  6.5 - 8.1 g/dL Final   Albumin  08/03/2024 3.0 (L)  3.5 - 5.0 g/dL Final   AST 90/96/7974 76 (H)  15 - 41 U/L Final   ALT 08/03/2024 54 (H)  0 - 44 U/L Final   Alkaline Phosphatase 08/03/2024 203 (H)  38 - 126 U/L Final   Total Bilirubin 08/03/2024 2.2 (H)  0.0 - 1.2 mg/dL Final   Bilirubin, Direct 08/03/2024 1.0 (H)  0.0 - 0.2 mg/dL Final   Indirect Bilirubin 08/03/2024 1.2 (H)  0.3 - 0.9 mg/dL Final   Ammonia 90/96/7974 150 (H)  9 - 35 umol/L Final   Prothrombin Time 08/03/2024 18.5 (H)  11.4 - 15.2 seconds Final   INR 08/03/2024 1.5 (H)  0.8 - 1.2 Final   HIV Screen 4th Generation wRfx 08/03/2024 Non Reactive  Non Reactive Final   Hep B S AB Quant (Post) 08/03/2024 7.0 (L)   Immunity>10 mIU/mL Final   Hepatitis B Surface Ag 08/03/2024 NON REACTIVE  NON REACTIVE Final   Sodium 08/04/2024 135  135 - 145 mmol/L Final   Potassium 08/04/2024 3.2 (L)  3.5 - 5.1 mmol/L Final   Chloride 08/04/2024 97 (L)  98 - 111 mmol/L Final   CO2 08/04/2024 22  22 - 32 mmol/L Final   Glucose, Bld 08/04/2024 167 (H)  70 - 99 mg/dL Final   BUN 90/95/7974 34 (H)  6 - 20 mg/dL Final   Creatinine, Ser 08/04/2024 5.57 (H)  0.61 - 1.24 mg/dL Final   Calcium  08/04/2024 8.9  8.9 - 10.3 mg/dL Final  GFR, Estimated 08/04/2024 12 (L)  >60 mL/min Final   Anion gap 08/04/2024 16 (H)  5 - 15 Final   WBC 08/04/2024 8.8  4.0 - 10.5 K/uL Final   RBC 08/04/2024 2.83 (L)  4.22 - 5.81 MIL/uL Final   Hemoglobin 08/04/2024 10.0 (L)  13.0 - 17.0 g/dL Final   HCT 90/95/7974 30.1 (L)  39.0 - 52.0 % Final   MCV 08/04/2024 106.4 (H)  80.0 - 100.0 fL Final   MCH 08/04/2024 35.3 (H)  26.0 - 34.0 pg Final   MCHC 08/04/2024 33.2  30.0 - 36.0 g/dL Final   RDW 90/95/7974 15.6 (H)  11.5 - 15.5 % Final   Platelets 08/04/2024 122 (L)  150 - 400 K/uL Final   nRBC 08/04/2024 0.0  0.0 - 0.2 % Final   Sodium 08/05/2024 136  135 - 145 mmol/L Final   Potassium 08/05/2024 3.5  3.5 - 5.1 mmol/L Final   Chloride 08/05/2024 98  98 - 111 mmol/L Final   CO2 08/05/2024 21 (L)  22 - 32 mmol/L Final   Glucose, Bld 08/05/2024 115 (H)  70 - 99 mg/dL Final   BUN 90/94/7974 63 (H)  6 - 20 mg/dL Final   Creatinine, Ser 08/05/2024 9.96 (H)  0.61 - 1.24 mg/dL Final   Calcium  08/05/2024 8.3 (L)  8.9 - 10.3 mg/dL Final   Phosphorus 90/94/7974 8.4 (H)  2.5 - 4.6 mg/dL Final   Albumin  08/05/2024 2.6 (L)  3.5 - 5.0 g/dL Final   GFR, Estimated 08/05/2024 6 (L)  >60 mL/min Final   Anion gap 08/05/2024 17 (H)  5 - 15 Final   WBC 08/05/2024 7.0  4.0 - 10.5 K/uL Final   RBC 08/05/2024 2.69 (L)  4.22 - 5.81 MIL/uL Final   Hemoglobin 08/05/2024 9.4 (L)  13.0 - 17.0 g/dL Final   HCT 90/94/7974 29.5 (L)  39.0 - 52.0 % Final   MCV 08/05/2024  109.7 (H)  80.0 - 100.0 fL Final   MCH 08/05/2024 34.9 (H)  26.0 - 34.0 pg Final   MCHC 08/05/2024 31.9  30.0 - 36.0 g/dL Final   RDW 90/94/7974 15.6 (H)  11.5 - 15.5 % Final   Platelets 08/05/2024 79 (L)  150 - 400 K/uL Final   nRBC 08/05/2024 0.0  0.0 - 0.2 % Final  Appointment on 07/19/2024  Component Date Value Ref Range Status   Kappa free light chain 07/19/2024 1,279.1 (H)  3.3 - 19.4 mg/L Final   Lambda free light chains 07/19/2024 45.7 (H)  5.7 - 26.3 mg/L Final   Kappa, lambda light chain ratio 07/19/2024 27.99 (H)  0.26 - 1.65 Final   IgG (Immunoglobin G), Serum 07/19/2024 659  603 - 1,613 mg/dL Final   IgA 91/80/7974 1,064 (H)  90 - 386 mg/dL Final   IgM (Immunoglobulin M), Srm 07/19/2024 83  20 - 172 mg/dL Final   Total Protein ELP 07/19/2024 6.2  6.0 - 8.5 g/dL Corrected   Albumin  SerPl Elph-Mcnc 07/19/2024 3.3  2.9 - 4.4 g/dL Corrected   Alpha 1 91/80/7974 0.3  0.0 - 0.4 g/dL Corrected   Alpha2 Glob SerPl Elph-Mcnc 07/19/2024 0.6  0.4 - 1.0 g/dL Corrected   B-Globulin SerPl Elph-Mcnc 07/19/2024 1.6 (H)  0.7 - 1.3 g/dL Corrected   Gamma Glob SerPl Elph-Mcnc 07/19/2024 0.5  0.4 - 1.8 g/dL Corrected   M Protein SerPl Elph-Mcnc 07/19/2024 0.7 (H)  Not Observed g/dL Corrected   Globulin, Total 07/19/2024 2.9  2.2 - 3.9 g/dL Corrected  Albumin /Glob SerPl 07/19/2024 1.2  0.7 - 1.7 Corrected   IFE 1 07/19/2024 Comment (A)   Corrected   Please Note 07/19/2024 Comment   Corrected   Vitamin B-12 07/19/2024 >7,500 (H)  180 - 914 pg/mL Final   Folate 07/19/2024 20.2  >5.9 ng/mL Final   Iron 07/19/2024 132  45 - 182 ug/dL Final   TIBC 91/80/7974 388  250 - 450 ug/dL Final   Saturation Ratios 07/19/2024 34  17.9 - 39.5 % Final   UIBC 07/19/2024 256  ug/dL Final   Ferritin 91/80/7974 547 (H)  24 - 336 ng/mL Final   Sodium 07/19/2024 139  135 - 145 mmol/L Final   Potassium 07/19/2024 3.5  3.5 - 5.1 mmol/L Final   Chloride 07/19/2024 99  98 - 111 mmol/L Final   CO2 07/19/2024 25  22 -  32 mmol/L Final   Glucose, Bld 07/19/2024 152 (H)  70 - 99 mg/dL Final   BUN 91/80/7974 44 (H)  6 - 20 mg/dL Final   Creatinine 91/80/7974 6.07 (H)  0.61 - 1.24 mg/dL Final   Calcium  07/19/2024 9.7  8.9 - 10.3 mg/dL Final   Total Protein 91/80/7974 6.5  6.5 - 8.1 g/dL Final   Albumin  07/19/2024 3.4 (L)  3.5 - 5.0 g/dL Final   AST 91/80/7974 87 (H)  15 - 41 U/L Final   ALT 07/19/2024 84 (H)  0 - 44 U/L Final   Alkaline Phosphatase 07/19/2024 286 (H)  38 - 126 U/L Final   Total Bilirubin 07/19/2024 2.1 (H)  0.0 - 1.2 mg/dL Final   GFR, Estimated 07/19/2024 10 (L)  >60 mL/min Final   Anion gap 07/19/2024 15  5 - 15 Final   WBC Count 07/19/2024 4.8  4.0 - 10.5 K/uL Final   RBC 07/19/2024 2.65 (L)  4.22 - 5.81 MIL/uL Final   Hemoglobin 07/19/2024 9.6 (L)  13.0 - 17.0 g/dL Final   HCT 91/80/7974 28.8 (L)  39.0 - 52.0 % Final   MCV 07/19/2024 108.7 (H)  80.0 - 100.0 fL Final   MCH 07/19/2024 36.2 (H)  26.0 - 34.0 pg Final   MCHC 07/19/2024 33.3  30.0 - 36.0 g/dL Final   RDW 91/80/7974 15.6 (H)  11.5 - 15.5 % Final   Platelet Count 07/19/2024 58 (L)  150 - 400 K/uL Final   nRBC 07/19/2024 0.0  0.0 - 0.2 % Final   Neutrophils Relative % 07/19/2024 60  % Final   Neutro Abs 07/19/2024 2.9  1.7 - 7.7 K/uL Final   Lymphocytes Relative 07/19/2024 21  % Final   Lymphs Abs 07/19/2024 1.0  0.7 - 4.0 K/uL Final   Monocytes Relative 07/19/2024 13  % Final   Monocytes Absolute 07/19/2024 0.6  0.1 - 1.0 K/uL Final   Eosinophils Relative 07/19/2024 4  % Final   Eosinophils Absolute 07/19/2024 0.2  0.0 - 0.5 K/uL Final   Basophils Relative 07/19/2024 1  % Final   Basophils Absolute 07/19/2024 0.0  0.0 - 0.1 K/uL Final   Immature Granulocytes 07/19/2024 1  % Final   Abs Immature Granulocytes 07/19/2024 0.05  0.00 - 0.07 K/uL Final  Office Visit on 06/16/2024  Component Date Value Ref Range Status   Color, Urine 06/16/2024 DARK YELLOW  YELLOW Final   APPearance 06/16/2024 TURBID (A)  CLEAR Final    Specific Gravity, Urine 06/16/2024 1.019  1.001 - 1.035 Final   pH 06/16/2024 5.5  5.0 - 8.0 Final   Glucose, UA 06/16/2024 NEGATIVE  NEGATIVE Final   Bilirubin Urine 06/16/2024 NEGATIVE  NEGATIVE Final   Ketones, ur 06/16/2024 TRACE (A)  NEGATIVE Final   Hgb urine dipstick 06/16/2024 TRACE (A)  NEGATIVE Final   Protein, ur 06/16/2024 2+ (A)  NEGATIVE Final   Nitrites, Initial 06/16/2024 NEGATIVE  NEGATIVE Final   Leukocyte Esterase 06/16/2024 3+ (A)  NEGATIVE Final   WBC, UA 06/16/2024 > OR = 60 (A)  0 - 5 /HPF Final   RBC / HPF 06/16/2024 NONE SEEN  0 - 2 /HPF Final   Squamous Epithelial / HPF 06/16/2024 0-5  < OR = 5 /HPF Final   Bacteria, UA 06/16/2024 MANY (A)  NONE SEEN /HPF Final   Hyaline Cast 06/16/2024 NONE SEEN  NONE SEEN /LPF Final   Note 06/16/2024    Final   MICRO NUMBER: 06/16/2024 [id number]   Final   SPECIMEN QUALITY: 06/16/2024 Adequate   Final   Sample Source 06/16/2024 URINE   Final   STATUS: 06/16/2024 FINAL   Final   ISOLATE 1: 06/16/2024 Klebsiella pneumoniae (A)   Final   REFLEXIVE URINE CULTURE 06/16/2024    Final  Admission on 05/06/2024, Discharged on 05/06/2024  Component Date Value Ref Range Status   Sodium 05/06/2024 135  135 - 145 mmol/L Final   Potassium 05/06/2024 3.7  3.5 - 5.1 mmol/L Final   Chloride 05/06/2024 101  98 - 111 mmol/L Final   BUN 05/06/2024 38 (H)  6 - 20 mg/dL Final   Creatinine, Ser 05/06/2024 7.50 (H)  0.61 - 1.24 mg/dL Final   Glucose, Bld 93/93/7974 166 (H)  70 - 99 mg/dL Final   Calcium , Ion 05/06/2024 1.02 (L)  1.15 - 1.40 mmol/L Final   TCO2 05/06/2024 23  22 - 32 mmol/L Final   Hemoglobin 05/06/2024 11.2 (L)  13.0 - 17.0 g/dL Final   HCT 93/93/7974 33.0 (L)  39.0 - 52.0 % Final  Scanned Document on 03/24/2024  Component Date Value Ref Range Status   Creatinine, POC 05/30/2023 95.2  mg/dL Final   EGFR 93/70/7975 18.0   Final  Office Visit on 03/14/2024  Component Date Value Ref Range Status   Cholesterol 03/14/2024 208 (H)   0 - 200 mg/dL Final   Triglycerides 95/85/7974 167.0 (H)  0.0 - 149.0 mg/dL Final   HDL 95/85/7974 50.60  >39.00 mg/dL Final   VLDL 95/85/7974 33.4  0.0 - 40.0 mg/dL Final   LDL Cholesterol 03/14/2024 124 (H)  0 - 99 mg/dL Final   Total CHOL/HDL Ratio 03/14/2024 4   Final   NonHDL 03/14/2024 157.38   Final   Sodium 03/14/2024 137  135 - 145 mEq/L Final   Potassium 03/14/2024 4.3  3.5 - 5.1 mEq/L Final   Chloride 03/14/2024 98  96 - 112 mEq/L Final   CO2 03/14/2024 22  19 - 32 mEq/L Final   Glucose, Bld 03/14/2024 152 (H)  70 - 99 mg/dL Final   BUN 95/85/7974 69 (H)  6 - 23 mg/dL Final   Creatinine, Ser 03/14/2024 10.96 (HH)  0.40 - 1.50 mg/dL Final   Total Bilirubin 03/14/2024 1.6 (H)  0.2 - 1.2 mg/dL Final   Alkaline Phosphatase 03/14/2024 244 (H)  39 - 117 U/L Final   AST 03/14/2024 55 (H)  0 - 37 U/L Final   ALT 03/14/2024 64 (H)  0 - 53 U/L Final   Total Protein 03/14/2024 6.1  6.0 - 8.3 g/dL Final   Albumin  03/14/2024 3.3 (L)  3.5 - 5.2 g/dL Final  GFR 03/14/2024 4.94 (LL)  >60.00 mL/min Final   Calcium  03/14/2024 9.2  8.4 - 10.5 mg/dL Final   WBC 95/85/7974 7.8  4.0 - 10.5 K/uL Final   RBC 03/14/2024 3.07 (L)  4.22 - 5.81 Mil/uL Final   Hemoglobin 03/14/2024 11.0 (L)  13.0 - 17.0 g/dL Final   HCT 95/85/7974 32.0 (L)  39.0 - 52.0 % Final   MCV 03/14/2024 104.2 (H)  78.0 - 100.0 fl Final   MCHC 03/14/2024 34.4  30.0 - 36.0 g/dL Final   RDW 95/85/7974 15.9 (H)  11.5 - 15.5 % Final   Platelets 03/14/2024 89.0 (L)  150.0 - 400.0 K/uL Final   Neutrophils Relative % 03/14/2024 59.4  43.0 - 77.0 % Final   Lymphocytes Relative 03/14/2024 17.6  12.0 - 46.0 % Final   Monocytes Relative 03/14/2024 16.0 (H)  3.0 - 12.0 % Final   Eosinophils Relative 03/14/2024 6.6 (H)  0.0 - 5.0 % Final   Basophils Relative 03/14/2024 0.4  0.0 - 3.0 % Final   Neutro Abs 03/14/2024 4.6  1.4 - 7.7 K/uL Final   Lymphs Abs 03/14/2024 1.4  0.7 - 4.0 K/uL Final   Monocytes Absolute 03/14/2024 1.2 (H)  0.1 -  1.0 K/uL Final   Eosinophils Absolute 03/14/2024 0.5  0.0 - 0.7 K/uL Final   Basophils Absolute 03/14/2024 0.0  0.0 - 0.1 K/uL Final   TSH 03/14/2024 2.070  0.450 - 4.500 uIU/mL Final   Hgb A1c MFr Bld 03/14/2024 6.1  4.6 - 6.5 % Final   Magnesium  03/14/2024 2.4  1.5 - 2.5 mg/dL Final   Phosphorus 95/85/7974 8.1 (H)  2.3 - 4.6 mg/dL Final  Appointment on 97/82/7974  Component Date Value Ref Range Status   WBC Count 01/18/2024 7.4  4.0 - 10.5 K/uL Final   RBC 01/18/2024 3.33 (L)  4.22 - 5.81 MIL/uL Final   Hemoglobin 01/18/2024 11.4 (L)  13.0 - 17.0 g/dL Final   HCT 97/82/7974 33.4 (L)  39.0 - 52.0 % Final   MCV 01/18/2024 100.3 (H)  80.0 - 100.0 fL Final   MCH 01/18/2024 34.2 (H)  26.0 - 34.0 pg Final   MCHC 01/18/2024 34.1  30.0 - 36.0 g/dL Final   RDW 97/82/7974 14.8  11.5 - 15.5 % Final   Platelet Count 01/18/2024 70 (L)  150 - 400 K/uL Final   nRBC 01/18/2024 0.00  0.0 - 0.2 % Final   Neutrophils Relative % 01/18/2024 48  % Final   Neutro Abs 01/18/2024 3.6  1.7 - 7.7 K/uL Final   Lymphocytes Relative 01/18/2024 26  % Final   Lymphs Abs 01/18/2024 1.9  0.7 - 4.0 K/uL Final   Monocytes Relative 01/18/2024 16  % Final   Monocytes Absolute 01/18/2024 1.2 (H)  0.1 - 1.0 K/uL Final   Eosinophils Relative 01/18/2024 8  % Final   Eosinophils Absolute 01/18/2024 0.6 (H)  0.0 - 0.5 K/uL Final   Basophils Relative 01/18/2024 1  % Final   Basophils Absolute 01/18/2024 0.1  0.0 - 0.1 K/uL Final   Immature Granulocytes 01/18/2024 1  % Final   Abs Immature Granulocytes 01/18/2024 0.07  0.00 - 0.07 K/uL Final   nRBC 01/18/2024 0  0 /100 WBC Final   Immature Platelet Fraction 01/18/2024 0.3 (L)  1.2 - 8.6 % Final   Ferritin 01/18/2024 114  24 - 336 ng/mL Final   Iron 01/18/2024 152  45 - 182 ug/dL Final   TIBC 97/82/7974 456 (H)  250 -  450 ug/dL Final   Saturation Ratios 01/18/2024 33  17.9 - 39.5 % Final   UIBC 01/18/2024 304  ug/dL Final   Vitamin B-12 97/82/7974 6,881 (H)  180 - 914  pg/mL Final   IgG (Immunoglobin G), Serum 01/18/2024 561 (L)  603 - 1,613 mg/dL Final   IgA 97/82/7974 1,203 (H)  90 - 386 mg/dL Final   IgM (Immunoglobulin M), Srm 01/18/2024 91  20 - 172 mg/dL Final   Total Protein ELP 01/18/2024 5.9 (L)  6.0 - 8.5 g/dL Corrected   Albumin  SerPl Elph-Mcnc 01/18/2024 2.8 (L)  2.9 - 4.4 g/dL Corrected   Alpha 1 97/82/7974 0.3  0.0 - 0.4 g/dL Corrected   Alpha2 Glob SerPl Elph-Mcnc 01/18/2024 0.6  0.4 - 1.0 g/dL Corrected   B-Globulin SerPl Elph-Mcnc 01/18/2024 1.7 (H)  0.7 - 1.3 g/dL Corrected   Gamma Glob SerPl Elph-Mcnc 01/18/2024 0.4  0.4 - 1.8 g/dL Corrected   M Protein SerPl Elph-Mcnc 01/18/2024 0.9 (H)  Not Observed g/dL Corrected   Globulin, Total 01/18/2024 3.1  2.2 - 3.9 g/dL Corrected   Albumin /Glob SerPl 01/18/2024 1.0  0.7 - 1.7 Corrected   IFE 1 01/18/2024 Comment (A)   Corrected   Please Note 01/18/2024 Comment   Corrected   Kappa free light chain 01/18/2024 1,099.9 (H)  3.3 - 19.4 mg/L Final   Lambda free light chains 01/18/2024 33.2 (H)  5.7 - 26.3 mg/L Final   Kappa, lambda light chain ratio 01/18/2024 33.13 (H)  0.26 - 1.65 Final  There may be more visits with results that are not included.  No image results found. US  Abdomen Complete Result Date: 08/04/2024 CLINICAL DATA:  Encephalopathy.  Cirrhosis of liver. EXAM: ABDOMEN ULTRASOUND COMPLETE COMPARISON:  Right upper quadrant ultrasound 07/14/2024 FINDINGS: Gallbladder: Surgically absent. Common bile duct: Diameter: 6.8 mm Liver: No focal lesion identified. Decrease in parenchymal echogenicity. Portal vein is patent on color Doppler imaging with normal direction of blood flow towards the liver. IVC: No abnormality visualized.  Limited evaluation. Pancreas: Not well seen secondary to overlying bowel gas. Spleen: Size and appearance within normal limits.  10.5 cm. Right Kidney: Length: 8.1 cm. Limited evaluation. Echogenicity within normal limits. No mass or hydronephrosis visualized. Left  Kidney: Length: 7.5 cm. Limited evaluation. Echogenicity within normal limits. No mass or hydronephrosis visualized. Abdominal aorta: No aneurysm visualized.  1.6 cm. Other findings: None. IMPRESSION: 1. Decrease in parenchymal echogenicity of the liver. This is a nonspecific finding but can be seen in the setting of hepatitis. 2. Status post cholecystectomy. 3. Limited evaluation of the pancreas and kidneys. Electronically Signed   By: Greig Pique M.D.   On: 08/04/2024 22:00   CT Head Wo Contrast Result Date: 08/03/2024 EXAM: CT HEAD WITHOUT CONTRAST 08/03/2024 12:54:53 AM TECHNIQUE: CT of the head was performed without the administration of intravenous contrast. Automated exposure control, iterative reconstruction, and/or weight based adjustment of the mA/kV was utilized to reduce the radiation dose to as low as reasonably achievable. COMPARISON: None available. CLINICAL HISTORY: Mental status change, unknown cause. AMS. FINDINGS: BRAIN AND VENTRICLES: No acute hemorrhage. No evidence of acute infarct. No hydrocephalus. No extra-axial collection. No mass effect or midline shift. ORBITS: No acute abnormality. SINUSES: No acute abnormality. SOFT TISSUES AND SKULL: No acute soft tissue abnormality. No skull fracture. IMPRESSION: 1. No acute intracranial abnormality. Electronically signed by: Franky Stanford MD 08/03/2024 12:58 AM EDT RP Workstation: [id number]   US  Abdomen Limited RUQ (LIVER/GB) Result Date: 07/18/2024 CLINICAL DATA:  MASH.  Advanced fibrosis. EXAM: ULTRASOUND ABDOMEN LIMITED RIGHT UPPER QUADRANT COMPARISON:  12/24/2023 FINDINGS: Gallbladder: Status post cholecystectomy Common bile duct: Diameter: 8 mm Liver: Parenchymal echogenicity: Diffusely increased Contours: Normal Lesions: None Portal vein: Patent.  Hepatopetal flow Other: None. IMPRESSION: Diffuse increased echogenicity of the hepatic parenchyma is a nonspecific indicator of hepatocellular dysfunction, most commonly steatosis.  Electronically Signed   By: Aliene Lloyd M.D.   On: 07/18/2024 09:11         ASSESSMENT & PLAN   Assessment & Plan Prediabetes There is suspicion progression due to previous borderline results. ESRD (end stage renal disease) (HCC) He experiences pruritus likely due to elevated blood urea nitrogen levels, which are dialyzed out during treatment. His skin is dry and requires moisturizing. 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 Discuss pruritus with the dialysis center and advise increased skin moisturization. Chronic iron deficiency anemia This is managed with iron supplementation during dialysis sessions, though there was a recent lapse in iron administration this week. Check iron levels in today's blood work. Gastroesophageal reflux disease, unspecified whether esophagitis present He presents with GERD symptoms, recent episodes of indigestion, and vomiting. Vomitus contained black spots, suggestive of coffee ground emesis, indicating possible upper GI bleeding. Recent dental cleaning may have contributed to blood in the stomach. Current treatment includes Voquezna . Refer to GI specialist Deanna May for evaluation, continue Voquezna  20 mg for 20 days, and consider a Nissen procedure if bleeding persists. Urinary tract infection without hematuria, site unspecified A recent UTI was treated with antibiotics, and symptoms appear resolved. Perform urine analysis to confirm UTI resolution. Hx of CABG He is at high risk for heart disease, especially given his dialysis status. Efforts are ongoing to obtain Mounjaro  or Zepbound  for weight loss and cardiovascular risk reduction. Medication is expected to help with weight loss, sleep apnea, fatty liver disease, and reduce the risk of heart disease. Submit a request for Zepbound  to insurance and await blood work results to support the insurance appeal for Zepbound . Thrombocytopenia Lab Results   Component Value Date   PLT 50.0 Repeated and verified X2. (LL) 09/08/2024   PLT 51.0 (L) 08/09/2024   PLT 79 (L) 08/05/2024   PLT 122 (L) 08/04/2024   PLT 93 (L) 08/03/2024   PLT 105 (L) 08/02/2024   PLT 58 (L) 07/19/2024   PLT 89.0 (L) 03/14/2024   PLT 70 (L) 01/18/2024   PLT 62 (L) 01/16/2024   PLT 63 (L) 01/15/2024   PLT 66 (L) 01/14/2024   PLT 70 (L) 01/13/2024   PLT 73 (L) 01/12/2024   PLT 64 (L) 01/09/2024   PLT 65 (L) 01/08/2024   PLT 66 (L) 01/05/2024   PLT 70.0 (L) 12/14/2023   PLT 67.0 (L) 12/01/2023   PLT 66.0 (L) 11/09/2023  Low long term although fluctuates over time. Presumably due to severe Nonalcoholic Fatty Liver Disease (NAFLD). Other iron deficiency anemia  Metabolic dysfunction-associated steatohepatitis (MASH)  Hypothyroidism, unspecified type    ORDER ASSOCIATIONS  #   DIAGNOSIS / CONDITION ICD-10 ENCOUNTER ORDER     ICD-10-CM   1. Prediabetes  R73.03 Urinalysis w microscopic + reflex cultur    CBC with Differential/Platelet    Comprehensive metabolic panel with GFR    Lipid panel    Hemoglobin A1c    Microalbumin / creatinine urine ratio    tirzepatide  (ZEPBOUND ) 2.5 MG/0.5ML Pen    TSH + free T4    Iron, TIBC and Ferritin Panel  2. ESRD (end stage renal disease) (HCC)  N18.6 Lipid panel    tirzepatide  (ZEPBOUND ) 2.5 MG/0.5ML Pen    3. Chronic iron deficiency anemia  D50.9 CBC with Differential/Platelet    Comprehensive metabolic panel with GFR    Iron, TIBC and Ferritin Panel    4. Gastroesophageal reflux disease, unspecified whether esophagitis present  K21.9     5. Urinary tract infection without hematuria, site unspecified  N39.0 Urinalysis w microscopic + reflex cultur    Microalbumin / creatinine urine ratio    6. Hx of CABG  Z95.1 Lipid panel    tirzepatide  (ZEPBOUND ) 2.5 MG/0.5ML Pen    7. Thrombocytopenia  D69.6 tirzepatide  (ZEPBOUND ) 2.5 MG/0.5ML Pen    8. Other iron deficiency anemia  D50.8     9. Metabolic  dysfunction-associated steatohepatitis (MASH)  K75.81 Lipid panel    tirzepatide  (ZEPBOUND ) 2.5 MG/0.5ML Pen    10. Hypothyroidism, unspecified type  E03.9 TSH + free T4           Orders Placed in Encounter:   Lab Orders         Urinalysis w microscopic + reflex cultur         CBC with Differential/Platelet         Comprehensive metabolic panel with GFR         Lipid panel         Hemoglobin A1c         Microalbumin / creatinine urine ratio         TSH + free T4         Iron, TIBC and Ferritin Panel      Meds ordered this encounter  Medications   tirzepatide  (ZEPBOUND ) 2.5 MG/0.5ML Pen    Sig: Inject 2.5 mg into the skin once a week.    Dispense:  2 mL    Refill:  11       This document was synthesized by artificial intelligence ([employer]) using HIPAA-compliant recording of the clinical interaction;   We discussed the use of AI scribe software for clinical note transcription with the patient, who gave verbal consent to proceed. additional Info: This encounter employed state-of-the-art, real-time, collaborative documentation. The patient actively reviewed and assisted in updating their electronic medical record on a shared screen, ensuring transparency and facilitating joint problem-solving for the problem list, overview, and plan. This approach promotes accurate, informed care. The treatment plan was discussed and reviewed in detail, including medication safety, potential side effects, and all patient questions. We confirmed understanding and comfort with the plan. Follow-up instructions were established, including contacting the office for any concerns, returning if symptoms worsen, persist, or new symptoms develop, and precautions for potential emergency department visits.

## 2024-09-08 NOTE — Assessment & Plan Note (Signed)
 There is suspicion progression due to previous borderline results.

## 2024-09-08 NOTE — Telephone Encounter (Signed)
 CRITICAL VALUE STICKER  CRITICAL VALUE: Platelet count 50000  RECEIVER (on-site recipient of call): Yovani Cogburn C CMA II  DATE & TIME NOTIFIED:   MESSENGER (representative from lab): [facility]  MD NOTIFIED: Jesus   TIME OF NOTIFICATION: 09/08/24  RESPONSE:  Per PCP call pt and advise if any bleeding go to ED

## 2024-09-08 NOTE — Telephone Encounter (Signed)
 CRITICAL VALUE STICKER  CRITICAL VALUE: GFR 8.04  RECEIVER (on-site recipient of call): Claria Salt, CMA  DATE & TIME NOTIFIED: 3:53 PM  MESSENGER (representative from lab): [facility])  MD NOTIFIED: Garnette Lukes, MD  TIME OF NOTIFICATION: 3:55 PM  RESPONSE: Pt is currently on dialysis, Normal GFR reading.

## 2024-09-08 NOTE — Telephone Encounter (Signed)
 Called pt to advise Platelet count 50000 and per PCP if any bleeding to report to ED, pt verbalized understanding.

## 2024-09-08 NOTE — Assessment & Plan Note (Signed)
 A recent UTI was treated with antibiotics, and symptoms appear resolved. Perform urine analysis to confirm UTI resolution.

## 2024-09-08 NOTE — Assessment & Plan Note (Signed)
 He experiences pruritus likely due to elevated blood urea nitrogen levels, which are dialyzed out during treatment. His skin is dry and requires moisturizing. 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 Discuss pruritus with the dialysis center and advise increased skin moisturization.

## 2024-09-09 ENCOUNTER — Other Ambulatory Visit: Payer: Self-pay | Admitting: Licensed Clinical Social Worker

## 2024-09-09 ENCOUNTER — Other Ambulatory Visit (HOSPITAL_COMMUNITY): Payer: Self-pay

## 2024-09-09 ENCOUNTER — Other Ambulatory Visit (HOSPITAL_BASED_OUTPATIENT_CLINIC_OR_DEPARTMENT_OTHER): Payer: Self-pay

## 2024-09-09 DIAGNOSIS — Z992 Dependence on renal dialysis: Secondary | ICD-10-CM | POA: Diagnosis not present

## 2024-09-09 DIAGNOSIS — L299 Pruritus, unspecified: Secondary | ICD-10-CM | POA: Diagnosis not present

## 2024-09-09 DIAGNOSIS — N2581 Secondary hyperparathyroidism of renal origin: Secondary | ICD-10-CM | POA: Diagnosis not present

## 2024-09-09 DIAGNOSIS — N186 End stage renal disease: Secondary | ICD-10-CM | POA: Diagnosis not present

## 2024-09-09 DIAGNOSIS — R52 Pain, unspecified: Secondary | ICD-10-CM | POA: Diagnosis not present

## 2024-09-09 LAB — IRON,TIBC AND FERRITIN PANEL
%SAT: 36 % (ref 20–48)
Ferritin: 836 ng/mL — ABNORMAL HIGH (ref 38–380)
Iron: 110 ug/dL (ref 50–180)
TIBC: 302 ug/dL (ref 250–425)

## 2024-09-09 LAB — URINALYSIS W MICROSCOPIC + REFLEX CULTURE
Bacteria, UA: NONE SEEN /HPF
Bilirubin Urine: NEGATIVE
Hyaline Cast: NONE SEEN /LPF
Ketones, ur: NEGATIVE
Leukocyte Esterase: NEGATIVE
Nitrites, Initial: NEGATIVE
RBC / HPF: NONE SEEN /HPF (ref 0–2)
Specific Gravity, Urine: 1.018 (ref 1.001–1.035)
pH: 6 (ref 5.0–8.0)

## 2024-09-09 LAB — TSH+FREE T4: TSH W/REFLEX TO FT4: 2.53 m[IU]/L (ref 0.40–4.50)

## 2024-09-09 LAB — NO CULTURE INDICATED

## 2024-09-09 NOTE — Patient Instructions (Signed)
 [place] - I am sorry I was unable to reach you today for our scheduled appointment. I work with Jesus Bernardino MATSU, MD and am calling to support your healthcare needs. Please contact me at [phone] at your earliest convenience. I look forward to speaking with you soon.   Thank you,  Lyle Rung, BSW, MSW, LCSW Licensed Clinical Social Worker [employer]   [email or web address] Direct Dial : [phone]

## 2024-09-11 ENCOUNTER — Ambulatory Visit: Payer: Self-pay | Admitting: Internal Medicine

## 2024-09-12 DIAGNOSIS — L299 Pruritus, unspecified: Secondary | ICD-10-CM | POA: Diagnosis not present

## 2024-09-12 DIAGNOSIS — N186 End stage renal disease: Secondary | ICD-10-CM | POA: Diagnosis not present

## 2024-09-12 DIAGNOSIS — N2581 Secondary hyperparathyroidism of renal origin: Secondary | ICD-10-CM | POA: Diagnosis not present

## 2024-09-12 DIAGNOSIS — R52 Pain, unspecified: Secondary | ICD-10-CM | POA: Diagnosis not present

## 2024-09-12 DIAGNOSIS — Z992 Dependence on renal dialysis: Secondary | ICD-10-CM | POA: Diagnosis not present

## 2024-09-14 ENCOUNTER — Telehealth: Payer: Self-pay

## 2024-09-14 DIAGNOSIS — N2581 Secondary hyperparathyroidism of renal origin: Secondary | ICD-10-CM | POA: Diagnosis not present

## 2024-09-14 DIAGNOSIS — L299 Pruritus, unspecified: Secondary | ICD-10-CM | POA: Diagnosis not present

## 2024-09-14 DIAGNOSIS — Z992 Dependence on renal dialysis: Secondary | ICD-10-CM | POA: Diagnosis not present

## 2024-09-14 DIAGNOSIS — N186 End stage renal disease: Secondary | ICD-10-CM | POA: Diagnosis not present

## 2024-09-14 DIAGNOSIS — R52 Pain, unspecified: Secondary | ICD-10-CM | POA: Diagnosis not present

## 2024-09-14 NOTE — Telephone Encounter (Signed)
 Pharmacy Patient Advocate Encounter  Received notification from [facility] that Prior Authorization for Zepbound  2.5MG /0.5ML Auto-injectors has been DENIED.  Full denial letter will be uploaded to the media tab. See denial reason below.   PA #/Case ID/Reference #: [id number]

## 2024-09-16 DIAGNOSIS — R52 Pain, unspecified: Secondary | ICD-10-CM | POA: Diagnosis not present

## 2024-09-16 DIAGNOSIS — N2581 Secondary hyperparathyroidism of renal origin: Secondary | ICD-10-CM | POA: Diagnosis not present

## 2024-09-16 DIAGNOSIS — N186 End stage renal disease: Secondary | ICD-10-CM | POA: Diagnosis not present

## 2024-09-16 DIAGNOSIS — Z992 Dependence on renal dialysis: Secondary | ICD-10-CM | POA: Diagnosis not present

## 2024-09-16 DIAGNOSIS — L299 Pruritus, unspecified: Secondary | ICD-10-CM | POA: Diagnosis not present

## 2024-09-19 ENCOUNTER — Other Ambulatory Visit (HOSPITAL_BASED_OUTPATIENT_CLINIC_OR_DEPARTMENT_OTHER): Payer: Self-pay

## 2024-09-19 DIAGNOSIS — N2581 Secondary hyperparathyroidism of renal origin: Secondary | ICD-10-CM | POA: Diagnosis not present

## 2024-09-19 DIAGNOSIS — Z992 Dependence on renal dialysis: Secondary | ICD-10-CM | POA: Diagnosis not present

## 2024-09-19 DIAGNOSIS — N186 End stage renal disease: Secondary | ICD-10-CM | POA: Diagnosis not present

## 2024-09-19 DIAGNOSIS — R52 Pain, unspecified: Secondary | ICD-10-CM | POA: Diagnosis not present

## 2024-09-19 DIAGNOSIS — L299 Pruritus, unspecified: Secondary | ICD-10-CM | POA: Diagnosis not present

## 2024-09-20 ENCOUNTER — Encounter: Payer: Self-pay | Admitting: Licensed Clinical Social Worker

## 2024-09-20 ENCOUNTER — Telehealth: Payer: Self-pay | Admitting: Licensed Clinical Social Worker

## 2024-09-20 NOTE — Patient Instructions (Signed)
 [place] - I am sorry I was unable to reach you today for our scheduled appointment. I work with Jesus Bernardino MATSU, MD and am calling to support your healthcare needs. Please contact me at [phone] at your earliest convenience. I look forward to speaking with you soon when I make another outreach attempt to you on 10/05/24 at 9:45 am  Thank you,  Lyle Rung, BSW, MSW, LCSW Licensed Clinical Social Worker [employer]   [facility] [email or web address] Direct Dial : [phone]

## 2024-09-21 DIAGNOSIS — L299 Pruritus, unspecified: Secondary | ICD-10-CM | POA: Diagnosis not present

## 2024-09-21 DIAGNOSIS — N186 End stage renal disease: Secondary | ICD-10-CM | POA: Diagnosis not present

## 2024-09-21 DIAGNOSIS — N2581 Secondary hyperparathyroidism of renal origin: Secondary | ICD-10-CM | POA: Diagnosis not present

## 2024-09-21 DIAGNOSIS — R52 Pain, unspecified: Secondary | ICD-10-CM | POA: Diagnosis not present

## 2024-09-21 DIAGNOSIS — Z992 Dependence on renal dialysis: Secondary | ICD-10-CM | POA: Diagnosis not present

## 2024-09-23 DIAGNOSIS — R52 Pain, unspecified: Secondary | ICD-10-CM | POA: Diagnosis not present

## 2024-09-23 DIAGNOSIS — N2581 Secondary hyperparathyroidism of renal origin: Secondary | ICD-10-CM | POA: Diagnosis not present

## 2024-09-23 DIAGNOSIS — N186 End stage renal disease: Secondary | ICD-10-CM | POA: Diagnosis not present

## 2024-09-23 DIAGNOSIS — L299 Pruritus, unspecified: Secondary | ICD-10-CM | POA: Diagnosis not present

## 2024-09-23 DIAGNOSIS — Z992 Dependence on renal dialysis: Secondary | ICD-10-CM | POA: Diagnosis not present

## 2024-09-26 DIAGNOSIS — N2581 Secondary hyperparathyroidism of renal origin: Secondary | ICD-10-CM | POA: Diagnosis not present

## 2024-09-26 DIAGNOSIS — N186 End stage renal disease: Secondary | ICD-10-CM | POA: Diagnosis not present

## 2024-09-26 DIAGNOSIS — Z992 Dependence on renal dialysis: Secondary | ICD-10-CM | POA: Diagnosis not present

## 2024-09-26 DIAGNOSIS — L299 Pruritus, unspecified: Secondary | ICD-10-CM | POA: Diagnosis not present

## 2024-09-26 DIAGNOSIS — R52 Pain, unspecified: Secondary | ICD-10-CM | POA: Diagnosis not present

## 2024-09-28 DIAGNOSIS — L299 Pruritus, unspecified: Secondary | ICD-10-CM | POA: Diagnosis not present

## 2024-09-28 DIAGNOSIS — R52 Pain, unspecified: Secondary | ICD-10-CM | POA: Diagnosis not present

## 2024-09-28 DIAGNOSIS — N2581 Secondary hyperparathyroidism of renal origin: Secondary | ICD-10-CM | POA: Diagnosis not present

## 2024-09-28 DIAGNOSIS — N186 End stage renal disease: Secondary | ICD-10-CM | POA: Diagnosis not present

## 2024-09-28 DIAGNOSIS — Z992 Dependence on renal dialysis: Secondary | ICD-10-CM | POA: Diagnosis not present

## 2024-09-30 DIAGNOSIS — L299 Pruritus, unspecified: Secondary | ICD-10-CM | POA: Diagnosis not present

## 2024-09-30 DIAGNOSIS — Z992 Dependence on renal dialysis: Secondary | ICD-10-CM | POA: Diagnosis not present

## 2024-09-30 DIAGNOSIS — I509 Heart failure, unspecified: Secondary | ICD-10-CM | POA: Diagnosis not present

## 2024-09-30 DIAGNOSIS — R52 Pain, unspecified: Secondary | ICD-10-CM | POA: Diagnosis not present

## 2024-09-30 DIAGNOSIS — N2581 Secondary hyperparathyroidism of renal origin: Secondary | ICD-10-CM | POA: Diagnosis not present

## 2024-09-30 DIAGNOSIS — N186 End stage renal disease: Secondary | ICD-10-CM | POA: Diagnosis not present

## 2024-10-03 DIAGNOSIS — N186 End stage renal disease: Secondary | ICD-10-CM | POA: Diagnosis not present

## 2024-10-03 DIAGNOSIS — L299 Pruritus, unspecified: Secondary | ICD-10-CM | POA: Diagnosis not present

## 2024-10-03 DIAGNOSIS — Z992 Dependence on renal dialysis: Secondary | ICD-10-CM | POA: Diagnosis not present

## 2024-10-03 DIAGNOSIS — R52 Pain, unspecified: Secondary | ICD-10-CM | POA: Diagnosis not present

## 2024-10-03 DIAGNOSIS — N2581 Secondary hyperparathyroidism of renal origin: Secondary | ICD-10-CM | POA: Diagnosis not present

## 2024-10-05 ENCOUNTER — Other Ambulatory Visit: Payer: Self-pay | Admitting: Licensed Clinical Social Worker

## 2024-10-05 DIAGNOSIS — Z992 Dependence on renal dialysis: Secondary | ICD-10-CM | POA: Diagnosis not present

## 2024-10-05 DIAGNOSIS — R52 Pain, unspecified: Secondary | ICD-10-CM | POA: Diagnosis not present

## 2024-10-05 DIAGNOSIS — L299 Pruritus, unspecified: Secondary | ICD-10-CM | POA: Diagnosis not present

## 2024-10-05 DIAGNOSIS — N186 End stage renal disease: Secondary | ICD-10-CM | POA: Diagnosis not present

## 2024-10-05 DIAGNOSIS — N2581 Secondary hyperparathyroidism of renal origin: Secondary | ICD-10-CM | POA: Diagnosis not present

## 2024-10-05 NOTE — Patient Instructions (Signed)
 Visit Information  Thank you for taking time to visit with me today. Please don't hesitate to contact me if I can be of assistance to you before our next scheduled appointment.  Your next care management appointment is by telephone on 11/02/24 at 2 pm  Telephone follow-up in 1 month  Please call the care guide team at [phone] if you need to cancel, schedule, or reschedule an appointment.   Please call the Suicide and Crisis Lifeline: 988 call the [place]  National Suicide Prevention Lifeline: 561-451-5398 or TTY: 234-212-0587 TTY 307-207-2925) to talk to a trained counselor call 1-800-273-TALK (toll free, 24 hour hotline) go to [facility] [address] [place] [phone]) call 911 if you are experiencing a Mental Health or Behavioral Health Crisis or need someone to talk to.  Lyle Rung, BSW, MSW, LCSW Licensed Clinical Social Worker [employer]   [facility] [email or web address] Direct Dial : [phone]

## 2024-10-05 NOTE — Patient Outreach (Signed)
 Complex Care Management   Visit Note  10/05/2024  Name:  Sean [name] MRN: [id number] DOB: Feb 01, 1971  Situation: Referral received for Complex Care Management related to SDOH Barriers:  Financial Resource Strain I obtained verbal consent from Patient.  Visit completed with Patient  on the phone  Background:   Past Medical History:  Diagnosis Date   Abnormal liver enzymes    a. Sees a doctor in [place].   Acute renal failure superimposed on stage 5 chronic kidney disease, not on chronic dialysis (HCC) 01/08/2024   AKI (acute kidney injury) 06/17/2023   Atrial fibrillation (HCC)    Cardiac cirrhosis    a. possible elevated LFTs/low platelets felt due to cardiac cirrhosis per 2017 admission.   Chronic combined systolic and diastolic CHF (congestive heart failure) (HCC)    Chronic kidney disease, stage 4 (severe) (HCC) 01/04/2016   Lab Results      Component    Value    Date/Time           GFR    29.24 (L)    09/30/2022 02:51 PM           GFR    27.27 (L)    09/24/2022 03:40 PM           GFR    24.10 (L)    09/03/2022 03:45 PM           GFR    18.98 (L)    08/25/2022 12:21 PM           GFR    38.98 (L)    04/24/2022 02:39 PM   Seeing [facility]ist: Jerrye Katheryn BROCKS, MD  Lastt plan 04/2023: # CKD stage IV    CKD (chronic kidney disease) stage 3, GFR 30-59 ml/min (HCC) 01/04/2016   Lab Results  Component  Value  Date/Time     GFR  29.24 (L)  09/30/2022 02:51 PM     GFR  27.27 (L)  09/24/2022 03:40 PM     GFR  24.10 (L)  09/03/2022 03:45 PM     GFR  18.98 (L)  08/25/2022 12:21 PM     GFR  38.98 (L)  04/24/2022 02:39 PM         CKD (chronic kidney disease), stage II    Stage 4   Congenital heart defect    a. rightward rotation of heart, almost dextrocardia   Coronary artery disease    a. s/p CABGx1 in 12/2015.   Dyspnea    with exertion   Gastric polyps 11/25/2022   Gastritis and gastroduodenitis 11/25/2022          Gastroesophageal reflux disease with  esophagitis and hemorrhage 04/04/2021   Hematuria    a. Chronic hx of this, no prior etiology determined through workup per patient.   History of umbilical hernia repair 07/10/2011   History of hernia surgery twice prior   Hypercholesteremia    a. Prev taken off statin due to abnormal liver function.   Hypertension    Incisional hernia 07/10/2011   History of hernia surgery twice prior   Morbid obesity (HCC)    Morbid obesity with BMI of 50.0-59.9, adult (HCC) 01/04/2016   After [facility] doctor is setting him up with a wellness clinic to help with weight losS  He reports not having tried anything for weight loss either diet or or medicine or surgery in the past  We failed to get Wegovy  10/2022      Wt  Readings from Last 10 Encounters:  11/13/22  (!) 384 lb 12.8 oz (174.5 kg)  10/13/22  (!) 385 lb (174.6 kg)  09/30/22  (!) 384 lb (174.2 kg)  09/24/22  (!) 382 lb (1   Nausea and vomiting 11/25/2022   OSA on CPAP    Paroxysmal atrial flutter (HCC) 02/12/2016   PONV (postoperative nausea and vomiting)    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   LIMA to LAD   Sinus bradycardia    Thrombocytopenia     Assessment: Patient Reported Symptoms:  Cognitive Cognitive Status: Able to follow simple commands, Alert and oriented to person, place, and time, Normal speech and language skills, No symptoms reported Cognitive/Intellectual Conditions Management [RPT]: None reported or documented in medical history or problem list   Health Maintenance Behaviors: Annual physical exam, Hobbies Healing Pattern: Average Health Facilitated by: Rest  Neurological Neurological Review of Symptoms: No symptoms reported Neurological Management Strategies: Routine screening Neurological Self-Management Outcome: 4 (good)  HEENT HEENT Symptoms Reported: No symptoms reported HEENT Management Strategies: Adequate rest, Routine screening HEENT Self-Management Outcome: 4 (good)    Cardiovascular Cardiovascular Symptoms  Reported: No symptoms reported Does patient have uncontrolled Hypertension?: Yes Is patient checking Blood Pressure at home?: Yes Cardiovascular Management Strategies: Routine screening, Medication therapy Cardiovascular Self-Management Outcome: 4 (good)  Respiratory Respiratory Symptoms Reported: No symptoms reported Respiratory Management Strategies: Adequate rest, Routine screening Respiratory Self-Management Outcome: 4 (good)  Endocrine Endocrine Symptoms Reported: No symptoms reported Is patient diabetic?:  (Prediabetic) Endocrine Self-Management Outcome: 4 (good)  Gastrointestinal Gastrointestinal Symptoms Reported: No symptoms reported Gastrointestinal Management Strategies: Adequate rest Gastrointestinal Self-Management Outcome: 4 (good)    Genitourinary Genitourinary Symptoms Reported: Not assessed    Integumentary Integumentary Symptoms Reported: Not assessed    Musculoskeletal Musculoskelatal Symptoms Reviewed: No symptoms reported Musculoskeletal Management Strategies: Routine screening, Medication therapy Musculoskeletal Self-Management Outcome: 3 (uncertain) Musculoskeletal Comment: Pt confirmed taking pain medication for this pain Falls in the past year?: No Number of falls in past year: 1 or less Was there an injury with Fall?: No Fall Risk Category Calculator: 0 Patient Fall Risk Level: Low Fall Risk    Psychosocial Psychosocial Symptoms Reported: No symptoms reported Behavioral Management Strategies: Coping strategies, Support system Behavioral Health Self-Management Outcome: 4 (good) Major Change/Loss/Stressor/Fears (CP): Resources Techniques to [employer] with Loss/Stress/Change: Withdraw, Diversional activities (Goes Hunting) Quality of Family Relationships: involved, helpful, supportive Do you feel physically threatened by others?: No    10/05/2024    PHQ2-9 Depression Screening   Little interest or pleasure in doing things Not at all  Feeling down, depressed,  or hopeless Not at all  PHQ-2 - Total Score 0  Trouble falling or staying asleep, or sleeping too much    Feeling tired or having little energy    Poor appetite or overeating     Feeling bad about yourself - or that you are a failure or have let yourself or your family down    Trouble concentrating on things, such as reading the newspaper or watching television    Moving or speaking so slowly that other people could have noticed.  Or the opposite - being so fidgety or restless that you have been moving around a lot more than usual    Thoughts that you would be better off dead, or hurting yourself in some way    PHQ2-9 Total Score    If you checked off any problems, how difficult have these problems made it for you to do your work, take  care of things at home, or get along with other people    Depression Interventions/Treatment  Pt denied BH needs or referrals    There were no vitals filed for this visit.  Medications Reviewed Today     Reviewed by Merlynn Lyle CROME, LCSW (Social Worker) on 10/05/24 at 1009  Med List Status: <None>   Medication Order Taking? Sig Documenting Provider Last Dose Status Informant  allopurinol  (ZYLOPRIM ) 100 MG tablet [id number]  TAKE 1 TABLET BY MOUTH EVERY DAY Jesus Bernardino MATSU, MD  Active Self, Mother, Pharmacy Records  amitriptyline  (ELAVIL ) 25 MG tablet [id number]  TAKE 1 TABLET BY MOUTH EVERYDAY AT BEDTIME May, Deanna J, NP  Active   azelastine  (ASTELIN ) 0.1 % nasal spray [id number]  Place 2 sprays into both nostrils 2 (two) times daily. Use in each nostril as directed Tobie Eldora NOVAK, MD  Active Self, Mother, Pharmacy Records  B Complex-C-Folic Acid  (DIALYVITE  TABLET) TABS [id number]  Take 1 tablet by mouth daily. [provider]  Active Self, Mother, Pharmacy Records  Cyanocobalamin  (VITAMIN B-12) 5000 MCG SUBL [id number]  Place 5,000 mcg under the tongue daily. [provider]  Active Self, Mother, Pharmacy Records  diltiazem  (CARDIZEM  SR) 120  MG 12 hr capsule [id number]  Take 1 capsule (120 mg total) by mouth 2 (two) times daily. Job Lukes, [place]  Active   ELIQUIS  5 MG TABS tablet [id number]  TAKE 1 TABLET BY MOUTH TWICE A DAY Chandrasekhar, Mahesh A, MD  Active   Evolocumab  (REPATHA ) 140 MG/ML SOSY [id number]  Inject 140 mg into the skin every 14 (fourteen) days. [provider]  Active Self, Mother, Pharmacy Records           Med Note EVERETTE JESUSA BROCKS   Dju Aug 06, 2024 11:34 AM) Next dose is due 9.14.2025  folic acid -vitamin b complex-vitamin c-selenium-zinc (DIALYVITE ) 3 MG TABS tablet [id number]  Take 1 tablet by mouth daily. [provider]  Active Self, Mother, Pharmacy Records  hydrocortisone  (ANUSOL -HC) 25 MG suppository [id number]  Place 1 suppository (25 mg total) rectally 2 (two) times daily. Jesus Bernardino MATSU, MD  Active   lactulose  (CHRONULAC ) 10 GM/15ML solution [id number]  Take 45 mLs (30 g total) by mouth 4 (four) times daily as needed (to acheive goal of 2-3 BMs daily). Titrate based on the stool frequency. Job Lukes, PA  Active   lactulose , encephalopathy, (CHRONULAC ) 10 GM/15ML SOLN [id number]  Take 30 mLs by mouth in the morning and at bedtime. [provider]  Active   lanthanum  (FOSRENOL ) 1000 MG chewable tablet [id number]  Chew 1,000 mg by mouth 3 (three) times daily with meals. [provider]  Active Self, Mother, Pharmacy Records           Med Note EVERETTE JESUSA BROCKS   Dju Aug 06, 2024 11:35 AM) Patient takes 2 capsules 3 times daily  losartan  (COZAAR ) 50 MG tablet [id number]  Take 1 tablet (50 mg total) by mouth daily.  Patient not taking: Reported on 09/08/2024   Job Lukes, [place]  Active   Methoxy PEG-Epoetin Beta (MIRCERA IJ) [id number]  75 mcg. [provider]  Active Self, Mother, Pharmacy Records           Med Note EVERETTE JESUSA BROCKS   Dju Aug 06, 2024 11:35 AM) At dialysis  montelukast  (SINGULAIR ) 10 MG tablet [id number]  TAKE 1 TABLET BY MOUTH EVERY DAY  Jesus Bernardino MATSU, MD  Active Self, Mother, Pharmacy Records  mupirocin  ointment (BACTROBAN ) 2 % [id number]  Apply to inner nares twice daily [place], [place], [place]  Active   nitroGLYCERIN  (NITROSTAT ) 0.4 MG SL tablet [id number]  Place 1 tablet (0.4 mg total) under the tongue every 5 (five) minutes as needed for chest pain. Santo Stanly LABOR, MD  Active   ondansetron  (ZOFRAN -ODT) 4 MG disintegrating tablet [id number]  Take 1 tablet (4 mg total) by mouth every 8 (eight) hours as needed for nausea or vomiting. Jesus Bernardino MATSU, MD  Active Self, Mother, Pharmacy Records  oxyCODONE -acetaminophen  (PERCOCET) 5-325 MG tablet [id number]  Take 1 tablet by mouth every 6 (six) hours as needed for severe pain (pain score 7-10). Rhyne, Samantha J, PA-C  Active Self, Mother, Pharmacy Records  polyethylene glycol (MIRALAX  / GLYCOLAX ) 17 g packet [id number]  Take 17 g by mouth daily as needed. Titrate based on the stool frequency. Goal :2-3 BMs a day, Job, [place], [place]  Active   promethazine  (PHENERGAN ) 25 MG tablet [id number]  Take 1 tablet (25 mg total) by mouth every 8 (eight) hours as needed for nausea or vomiting. Jesus Bernardino MATSU, MD  Active Self, Mother, Pharmacy Records  rifaximin  (XIFAXAN ) 550 MG TABS tablet [id number]  Take 1 tablet (550 mg total) by mouth 2 (two) times daily. Job Lukes, [place]  Active   sucralfate  (CARAFATE ) 1 g tablet [id number]  Take 1 g by mouth at bedtime. [provider]  Active   sulfamethoxazole -trimethoprim  (BACTRIM  DS) 800-160 MG tablet [id number]  Take 1 tablet by mouth daily. Take after dialysis on dialysis days Jodie Lavern CROME, MD  Active   technetium tetrofosmin  (TC-MYOVIEW ) injection 29.8 millicurie [id number]   Mona Vinie BROCKS, MD  Active   tirzepatide  (ZEPBOUND ) 2.5 MG/0.5ML Pen [id number]  Inject 2.5 mg into the skin once a week. Jesus Bernardino MATSU, MD  Active   torsemide  (DEMADEX ) 10 MG tablet [id number]  Take 10 mg by mouth daily.  Patient taking differently: Take 10  mg by mouth daily. Taking prn   [provider]  Active Self, Mother, Pharmacy Records           Med Note EVERETTE JESUSA BROCKS   Dju Aug 06, 2024 11:38 AM) Takes as needed  Vonoprazan Fumarate  (VOQUEZNA ) 20 MG TABS [id number]  Take by mouth. [provider]  Active Self, Mother, Pharmacy Records           Med Note EVERETTE JESUSA BROCKS   Dju Aug 06, 2024 11:38 AM) This is a sample medication the patient received from Dr Jesus  Med List Note Christie, Jeannetta, CPhT 05/04/24 9063): Dialysis Tue., Thurs.,& Sat.            Recommendation:   PCP Follow-up Continue Current Plan of Care  Follow Up Plan:   Referral to BSW  Lyle Rung, BSW, MSW, LCSW Licensed Clinical Social Worker [employer]   [facility] [email or web address] Direct Dial : [phone]

## 2024-10-06 ENCOUNTER — Other Ambulatory Visit: Payer: Self-pay | Admitting: Physician Assistant

## 2024-10-07 DIAGNOSIS — N2581 Secondary hyperparathyroidism of renal origin: Secondary | ICD-10-CM | POA: Diagnosis not present

## 2024-10-07 DIAGNOSIS — Z992 Dependence on renal dialysis: Secondary | ICD-10-CM | POA: Diagnosis not present

## 2024-10-07 DIAGNOSIS — L299 Pruritus, unspecified: Secondary | ICD-10-CM | POA: Diagnosis not present

## 2024-10-07 DIAGNOSIS — N186 End stage renal disease: Secondary | ICD-10-CM | POA: Diagnosis not present

## 2024-10-07 DIAGNOSIS — R52 Pain, unspecified: Secondary | ICD-10-CM | POA: Diagnosis not present

## 2024-10-10 DIAGNOSIS — L299 Pruritus, unspecified: Secondary | ICD-10-CM | POA: Diagnosis not present

## 2024-10-10 DIAGNOSIS — N2581 Secondary hyperparathyroidism of renal origin: Secondary | ICD-10-CM | POA: Diagnosis not present

## 2024-10-10 DIAGNOSIS — N186 End stage renal disease: Secondary | ICD-10-CM | POA: Diagnosis not present

## 2024-10-10 DIAGNOSIS — R52 Pain, unspecified: Secondary | ICD-10-CM | POA: Diagnosis not present

## 2024-10-10 DIAGNOSIS — Z992 Dependence on renal dialysis: Secondary | ICD-10-CM | POA: Diagnosis not present

## 2024-10-11 ENCOUNTER — Encounter: Payer: Self-pay | Admitting: Internal Medicine

## 2024-10-11 ENCOUNTER — Ambulatory Visit (INDEPENDENT_AMBULATORY_CARE_PROVIDER_SITE_OTHER): Admitting: Internal Medicine

## 2024-10-11 VITALS — BP 136/70 | HR 75 | Temp 98.0°F | Ht 71.0 in | Wt 366.2 lb

## 2024-10-11 DIAGNOSIS — Z5987 Material hardship due to limited financial resources, not elsewhere classified: Secondary | ICD-10-CM

## 2024-10-11 DIAGNOSIS — R1115 Cyclical vomiting syndrome unrelated to migraine: Secondary | ICD-10-CM

## 2024-10-11 DIAGNOSIS — K449 Diaphragmatic hernia without obstruction or gangrene: Secondary | ICD-10-CM | POA: Diagnosis not present

## 2024-10-11 DIAGNOSIS — I2510 Atherosclerotic heart disease of native coronary artery without angina pectoris: Secondary | ICD-10-CM

## 2024-10-11 DIAGNOSIS — I77819 Aortic ectasia, unspecified site: Secondary | ICD-10-CM

## 2024-10-11 DIAGNOSIS — J690 Pneumonitis due to inhalation of food and vomit: Secondary | ICD-10-CM

## 2024-10-11 DIAGNOSIS — L600 Ingrowing nail: Secondary | ICD-10-CM

## 2024-10-11 DIAGNOSIS — K2100 Gastro-esophageal reflux disease with esophagitis, without bleeding: Secondary | ICD-10-CM

## 2024-10-11 DIAGNOSIS — N186 End stage renal disease: Secondary | ICD-10-CM

## 2024-10-11 MED ORDER — TIRZEPATIDE-WEIGHT MANAGEMENT 2.5 MG/0.5ML ~~LOC~~ SOAJ: 2.5000 mg | SUBCUTANEOUS | 11 refills | Status: DC

## 2024-10-11 MED ORDER — PANTOPRAZOLE SODIUM 40 MG PO TBEC: 80.0000 mg | DELAYED_RELEASE_TABLET | Freq: Every day | ORAL | 3 refills | Status: DC

## 2024-10-11 MED ORDER — AZITHROMYCIN 250 MG PO TABS: ORAL_TABLET | ORAL | 0 refills | Status: AC

## 2024-10-11 MED ORDER — AZITHROMYCIN 250 MG PO TABS: ORAL_TABLET | ORAL | 0 refills | Status: DC

## 2024-10-11 NOTE — Assessment & Plan Note (Signed)
 Chronic nausea and vomiting with gastroesophageal reflux disease and suspected hiatal hernia   Chronic nausea and vomiting persist despite Voquezna  reducing acid-related symptoms. Financial constraints limit long-term Voquezna  use. 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 Continue Voquezna  for one more month, then taper and switch to Protonix . Ordered a CT scan of the abdomen and pelvis to evaluate for a hiatal hernia. Referred to a bariatric surgeon for potential surgical intervention and to GI for EGD to assess for esophageal cancer. Advised to flush the esophagus with fluids and use antacids like Tums or Mylanta after vomiting.  Previously we presumed this was due to bun elevation but it actually preceded it. Further discussion revealed he was once told he had hiatal hernia but documentaiton of it was minimal in chart.

## 2024-10-11 NOTE — Progress Notes (Signed)
 ==============================  [facility]: [phone]   -- Medical Office Visit --  Patient: Sean [name]      Age: 53 y.o.       Sex:  male  Date:   10/11/2024 Today's Healthcare Provider: Bernardino KANDICE Cone, MD  ==============================   Chief Complaint: Medication Refill Voquezna  main concern(s). Also got denied Zepbound  again-coverage exclusion by bc/bs  Discussed the use of AI scribe software for clinical note transcription with the patient, who gave verbal consent to proceed. History of Present Illness 53 year old male with coronary artery disease who presents with nausea and vomiting.  He experiences nausea and vomiting, with episodes occurring once or twice since the last visit. The vomiting is preceded by a build-up time, allowing him to anticipate it. Despite having nausea medication, he has not taken any this month, often due to being out or forgetting.  He has a history of a hiatal hernia diagnosed when he was younger, which may contribute to his symptoms. He has not had a recent endoscopy this year but saw a gastroenterologist earlier in the year. He is currently on Voquezna  to manage his symptoms.  He has a significant past medical history of coronary artery disease, having undergone bypass surgery in 2017 for a 90% blockage in the left anterior descending artery.  He reports a persistent cough, attributed to sinus congestion, especially after using his CPAP machine. He is unsure if he has had fevers.  He is on disability and faces insurance challenges, including issues with coverage for medications and treatments. He is currently on [employer].  Background Reviewed: Problem List: has History of umbilical hernia repair; Hypertension; Hyperlipidemia; Thrombocytopenia; Sinus bradycardia; Atherosclerotic heart disease of native coronary artery without angina pectoris; Atrial flutter with rapid ventricular  response (HCC); Coronary artery disease involving native coronary artery of native heart without angina pectoris; Other long term (current) drug therapy; History of coronary artery bypass surgery; Obstructive sleep apnea syndrome; Chronic gout of multiple sites; Alkaline phosphatase elevation; Morbid obesity with BMI of 50.0-59.9, adult (HCC); Gout; Ectatic aorta; Gastroesophageal reflux disease without esophagitis; Herpes simplex; Metabolic dysfunction-associated steatohepatitis (MASH); Prediabetes; Statins contraindicated; Hypothyroid; Chronic venous stasis dermatitis of both lower extremities; Chronic nausea; Chronic liver failure without hepatic coma (HCC); Splenomegaly; Spondylolisthesis; Bleeding diathesis; Malaise and fatigue; Allergic rhinitis due to pollen; Amitriptyline  adverse reaction; MGUS (monoclonal gammopathy of unknown significance); 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; Rectal bleeding; Recurrent sinusitis; Asthma, chronic; ESRD (end stage renal disease) (HCC); Atrial fibrillation (HCC); PUD (peptic ulcer disease); Disability affecting daily living; Inflamed internal hemorrhoid; Anemia in chronic kidney disease; Chronic diastolic CHF (congestive heart failure) (HCC); Coagulation defect, unspecified; Dependence on renal dialysis; Gout due to renal impairment, unspecified site; Hypertensive chronic kidney disease with stage 5 chronic kidney disease or end stage renal disease (HCC); Mild protein-calorie malnutrition; Other chronic pain; Other disorders of phosphorus metabolism; Other fluid overload; Iron deficiency anemia, unspecified; Other iron deficiency anemias; Pruritus, unspecified; Pure hypercholesterolemia, unspecified; Secondary hyperparathyroidism of renal origin; Shortness of breath; Abnormal bowel habits; Inadequate material resources; Decompensated cirrhosis (HCC); Acute hepatic encephalopathy (HCC); UTI (urinary tract infection); Consult; and Hiatal hernia with GERD and  esophagitis on their problem list. Past Medical History:  has a past medical history of Abnormal liver enzymes, Acute renal failure superimposed on stage 5 chronic kidney disease, not on chronic dialysis (HCC) (01/08/2024), AKI (acute kidney injury) (06/17/2023), Atrial fibrillation (HCC), Cardiac cirrhosis, Chronic combined systolic and diastolic CHF (  congestive heart failure) (HCC), Chronic kidney disease, stage 4 (severe) (HCC) (01/04/2016), CKD (chronic kidney disease) stage 3, GFR 30-59 ml/min (HCC) (01/04/2016), CKD (chronic kidney disease), stage II, Congenital heart defect, Coronary artery disease, Dyspnea, Gastric polyps (11/25/2022), Gastritis and gastroduodenitis (11/25/2022), Gastroesophageal reflux disease with esophagitis and hemorrhage (04/04/2021), Hematuria, History of umbilical hernia repair (07/10/2011), Hypercholesteremia, Hypertension, Incisional hernia (07/10/2011), Morbid obesity (HCC), Morbid obesity with BMI of 50.0-59.9, adult (HCC) (01/04/2016), Nausea and vomiting (11/25/2022), OSA on CPAP, Paroxysmal atrial flutter (HCC) (02/12/2016), PONV (postoperative nausea and vomiting), 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 Past Surgical History:   has a past surgical history that includes Gallbladder surgery; Appendectomy; Hernia repair; Cardiac catheterization (N/A, 12/24/2015); Coronary artery bypass graft (N/A, 12/26/2015); TEE without cardioversion (N/A, 12/26/2015); Cardioversion (N/A, 02/14/2016); TEE without cardioversion (N/A, 02/14/2016); LEFT HEART CATH AND CORS/GRAFTS ANGIOGRAPHY (N/A, 04/15/2017); LEFT HEART CATH AND CORS/GRAFTS ANGIOGRAPHY (N/A, 11/02/2020); Colonoscopy with propofol  (N/A, 02/19/2021); Esophagogastroduodenoscopy (egd) with propofol  (N/A, 02/19/2021); biopsy (02/19/2021); polypectomy (02/19/2021); Esophagogastroduodenoscopy (egd) with propofol  (N/A, 11/25/2022); biopsy (11/25/2022); IR Transcatheter BX (06/26/2023); IR US  Guide Vasc Access  Right (06/26/2023); IR Venogram Hepatic W Hemodynamic Evaluation (06/26/2023); AV fistula placement (Left, 01/11/2024); Insertion of dialysis catheter (Right, 01/11/2024); Fistula superficialization (Left, 05/06/2024); and Ligation of competing branches of arteriovenous fistula (Left, 05/06/2024). Social History:   reports that he has never smoked. He has never used smokeless tobacco. He reports that he does not currently use alcohol. He reports that he does not use drugs. Family History:  family history includes CAD in his father; Diabetes in his father, maternal grandfather, and maternal uncle; Heart attack in an other family member; Hyperlipidemia in an other family member. Allergies:  is allergic to glucophage  [metformin ], vibra -tab [doxycycline ], chlorhexidine , chlorhexidine  gluconate, augmentin [amoxicillin-pot clavulanate], imdur  [isosorbide  dinitrate], tape, and valacyclovir .   Medication Reconciliation: Current Outpatient Medications on File Prior to Visit  Medication Sig   allopurinol  (ZYLOPRIM ) 100 MG tablet TAKE 1 TABLET BY MOUTH EVERY DAY   amitriptyline  (ELAVIL ) 25 MG tablet TAKE 1 TABLET BY MOUTH EVERYDAY AT BEDTIME   azelastine  (ASTELIN ) 0.1 % nasal spray Place 2 sprays into both nostrils 2 (two) times daily. Use in each nostril as directed   B Complex-C-Folic Acid  (DIALYVITE  TABLET) TABS Take 1 tablet by mouth daily.   Cyanocobalamin  (VITAMIN B-12) 5000 MCG SUBL Place 5,000 mcg under the tongue daily.   diltiazem  (CARDIZEM  SR) 120 MG 12 hr capsule Take 1 capsule (120 mg total) by mouth 2 (two) times daily.   ELIQUIS  5 MG TABS tablet TAKE 1 TABLET BY MOUTH TWICE A DAY   Evolocumab  (REPATHA ) 140 MG/ML SOSY Inject 140 mg into the skin every 14 (fourteen) days.   folic acid -vitamin b complex-vitamin c-selenium-zinc (DIALYVITE ) 3 MG TABS tablet Take 1 tablet by mouth daily.   hydrocortisone  (ANUSOL -HC) 25 MG suppository Place 1 suppository (25 mg total) rectally 2 (two) times daily.   lactulose   (CHRONULAC ) 10 GM/15ML solution TAKE 45 MLS 4 TIMES DAILY AS NEEDED (TO ACHEIVE GOAL OF 2-3 BMS DAILY). TITRATE BASED ON THE STOOL FREQUENCY.   lactulose , encephalopathy, (CHRONULAC ) 10 GM/15ML SOLN Take 30 mLs by mouth in the morning and at bedtime.   lanthanum  (FOSRENOL ) 1000 MG chewable tablet Chew 1,000 mg by mouth 3 (three) times daily with meals.   losartan  (COZAAR ) 50 MG tablet Take 1 tablet (50 mg total) by mouth daily. (Patient not taking: Reported on 09/08/2024)   Methoxy PEG-Epoetin Beta (MIRCERA IJ) 75 mcg.   montelukast  (SINGULAIR )  10 MG tablet TAKE 1 TABLET BY MOUTH EVERY DAY   mupirocin  ointment (BACTROBAN ) 2 % Apply to inner nares twice daily   nitroGLYCERIN  (NITROSTAT ) 0.4 MG SL tablet Place 1 tablet (0.4 mg total) under the tongue every 5 (five) minutes as needed for chest pain.   ondansetron  (ZOFRAN -ODT) 4 MG disintegrating tablet Take 1 tablet (4 mg total) by mouth every 8 (eight) hours as needed for nausea or vomiting.   oxyCODONE -acetaminophen  (PERCOCET) 5-325 MG tablet Take 1 tablet by mouth every 6 (six) hours as needed for severe pain (pain score 7-10).   polyethylene glycol (MIRALAX  / GLYCOLAX ) 17 g packet Take 17 g by mouth daily as needed. Titrate based on the stool frequency. Goal :2-3 BMs a day,   promethazine  (PHENERGAN ) 25 MG tablet Take 1 tablet (25 mg total) by mouth every 8 (eight) hours as needed for nausea or vomiting.   rifaximin  (XIFAXAN ) 550 MG TABS tablet Take 1 tablet (550 mg total) by mouth 2 (two) times daily.   sucralfate  (CARAFATE ) 1 g tablet Take 1 g by mouth at bedtime.   sulfamethoxazole -trimethoprim  (BACTRIM  DS) 800-160 MG tablet Take 1 tablet by mouth daily. Take after dialysis on dialysis days (Patient not taking: Reported on 10/11/2024)   tirzepatide  (ZEPBOUND ) 2.5 MG/0.5ML Pen Inject 2.5 mg into the skin once a week.   torsemide  (DEMADEX ) 10 MG tablet Take 10 mg by mouth daily. (Patient taking differently: Take 10 mg by mouth daily. Taking prn)    Vonoprazan Fumarate  (VOQUEZNA ) 20 MG TABS Take by mouth.   Current Facility-Administered Medications on File Prior to Visit  Medication   technetium tetrofosmin  (TC-MYOVIEW ) injection 29.8 millicurie   Medications Discontinued During This Encounter  Medication Reason   azithromycin  (ZITHROMAX ) 250 MG tablet      Physical Exam:    10/11/2024   11:05 AM 09/08/2024   10:55 AM 09/01/2024   11:35 AM  Vitals with BMI  Height 5' 11 5' 11   Weight 366 lbs 3 oz 359 lbs 6 oz   BMI 51.1 50.15   Systolic 136 130 874  Diastolic 70 66 65  Pulse 75 79   Vital signs reviewed.  Nursing notes reviewed. Weight trend reviewed. Physical Activity: Not on file   General Appearance:  No acute distress appreciable.   Well-groomed, healthy-appearing male.  Well proportioned with no abnormal fat distribution.  Good muscle tone. Pulmonary:  Normal work of breathing at rest, no respiratory distress apparent. SpO2: 98 %  Musculoskeletal: All extremities are intact.  Neurological:  Awake, alert, oriented, and engaged.  No obvious focal neurological deficits or cognitive impairments.  Sensorium seems unclouded.   Speech is clear and coherent with logical content. Psychiatric:  Appropriate mood, pleasant and cooperative demeanor, thoughtful and engaged during the exam   Verbalized to patient: Physical Exam HEENT: Spots on tongue, concerning. CHEST: Crackles in right lower lobe.  Results RADIOLOGY MRI abdomen: Unable to complete due to claustrophobia and motion artifact (2022)  DIAGNOSTIC Coronary artery bypass graft: Left anterior descending artery 90% stenosis, bypass performed (2017)     10/05/2024   11:02 AM 09/01/2024   11:32 AM 08/08/2024   10:59 AM 07/26/2024   10:00 AM  PHQ 2/9 Scores  PHQ - 2 Score 0 0 0 0    {   No results found for any visits on 10/11/24.} Office Visit on 09/08/2024  Component Date Value Ref Range Status   Color, Urine 09/08/2024 DARK YELLOW  YELLOW Final  APPearance 09/08/2024 CLEAR  CLEAR Final   Specific Gravity, Urine 09/08/2024 1.018  1.001 - 1.035 Final   pH 09/08/2024 6.0  5.0 - 8.0 Final   Glucose, UA 09/08/2024 TRACE (A)  NEGATIVE Final   Bilirubin Urine 09/08/2024 NEGATIVE  NEGATIVE Final   Ketones, ur 09/08/2024 NEGATIVE  NEGATIVE Final   Hgb urine dipstick 09/08/2024 TRACE (A)  NEGATIVE Final   Protein, ur 09/08/2024 2+ (A)  NEGATIVE Final   Nitrites, Initial 09/08/2024 NEGATIVE  NEGATIVE Final   Leukocyte Esterase 09/08/2024 NEGATIVE  NEGATIVE Final   WBC, UA 09/08/2024 0-5  0 - 5 /HPF Final   RBC / HPF 09/08/2024 NONE SEEN  0 - 2 /HPF Final   Squamous Epithelial / HPF 09/08/2024 0-5  < OR = 5 /HPF Final   Bacteria, UA 09/08/2024 NONE SEEN  NONE SEEN /HPF Final   Hyaline Cast 09/08/2024 NONE SEEN  NONE SEEN /LPF Final   Note 09/08/2024    Final   WBC 09/08/2024 4.2  4.0 - 10.5 K/uL Final   RBC 09/08/2024 2.97 (L)  4.22 - 5.81 Mil/uL Final   Hemoglobin 09/08/2024 10.5 (L)  13.0 - 17.0 g/dL Final   HCT 89/90/7974 31.5 (L)  39.0 - 52.0 % Final   MCV 09/08/2024 106.0 (H)  78.0 - 100.0 fl Final   MCHC 09/08/2024 33.5  30.0 - 36.0 g/dL Final   RDW 89/90/7974 16.4 (H)  11.5 - 15.5 % Final   Platelets 09/08/2024 50.0 Repeated and verified X2. (LL)  150.0 - 400.0 K/uL Final   Neutrophils Relative % 09/08/2024 63.6  43.0 - 77.0 % Final   Lymphocytes Relative 09/08/2024 16.8  12.0 - 46.0 % Final   Monocytes Relative 09/08/2024 15.5 (H)  3.0 - 12.0 % Final   Eosinophils Relative 09/08/2024 3.5  0.0 - 5.0 % Final   Basophils Relative 09/08/2024 0.6  0.0 - 3.0 % Final   Neutro Abs 09/08/2024 2.7  1.4 - 7.7 K/uL Final   Lymphs Abs 09/08/2024 0.7  0.7 - 4.0 K/uL Final   Monocytes Absolute 09/08/2024 0.7  0.1 - 1.0 K/uL Final   Eosinophils Absolute 09/08/2024 0.1  0.0 - 0.7 K/uL Final   Basophils Absolute 09/08/2024 0.0  0.0 - 0.1 K/uL Final   Sodium 09/08/2024 138  135 - 145 mEq/L Final   Potassium 09/08/2024 3.2 (L)  3.5 - 5.1 mEq/L  Final   Chloride 09/08/2024 95 (L)  96 - 112 mEq/L Final   CO2 09/08/2024 30  19 - 32 mEq/L Final   Glucose, Bld 09/08/2024 135 (H)  70 - 99 mg/dL Final   BUN 89/90/7974 41 (H)  6 - 23 mg/dL Final   Creatinine, Ser 09/08/2024 7.27 (HH)  0.40 - 1.50 mg/dL Final   Total Bilirubin 09/08/2024 3.1 (H)  0.2 - 1.2 mg/dL Final   Alkaline Phosphatase 09/08/2024 297 (H)  39 - 117 U/L Final   AST 09/08/2024 74 (H)  0 - 37 U/L Final   ALT 09/08/2024 53  0 - 53 U/L Final   Total Protein 09/08/2024 6.8  6.0 - 8.3 g/dL Final   Albumin  09/08/2024 3.4 (L)  3.5 - 5.2 g/dL Final   GFR 89/90/7974 8.05 (LL)  >60.00 mL/min Final   Calcium  09/08/2024 9.2  8.4 - 10.5 mg/dL Final   Cholesterol 89/90/7974 195  0 - 200 mg/dL Final   Triglycerides 89/90/7974 238.0 (H)  0.0 - 149.0 mg/dL Final   HDL 89/90/7974 40.40  >39.00 mg/dL Final  VLDL 09/08/2024 47.6 (H)  0.0 - 40.0 mg/dL Final   LDL Cholesterol 09/08/2024 107 (H)  0 - 99 mg/dL Final   Total CHOL/HDL Ratio 09/08/2024 5   Final   NonHDL 09/08/2024 154.64   Final   Hgb A1c MFr Bld 09/08/2024 4.8  4.6 - 6.5 % Final   Microalb, Ur 09/08/2024 45.2 (H)  0.0 - 1.9 mg/dL Final   Creatinine,U 89/90/7974 126.2  mg/dL Final   Microalb Creat Ratio 09/08/2024 357.7 (H)  0.0 - 30.0 mg/g Final   TSH W/REFLEX TO FT4 09/08/2024 2.53  0.40 - 4.50 mIU/L Final   Iron 09/08/2024 110  50 - 180 mcg/dL Final   TIBC 89/90/7974 302  250 - 425 mcg/dL (calc) Final   %SAT 89/90/7974 36  20 - 48 % (calc) Final   Ferritin 09/08/2024 836 (H)  38 - 380 ng/mL Final   Reflexve Urine Culture 09/08/2024    Final  Office Visit on 09/01/2024  Component Date Value Ref Range Status   Color, UA 09/01/2024 dark yellow   Final   Clarity, UA 09/01/2024 cloudy   Final   Glucose, UA 09/01/2024 Positive (A)  Negative Final   Bilirubin, UA 09/01/2024 1 (A)   Final   Ketones, UA 09/01/2024 negative   Final   Spec Grav, UA 09/01/2024 1.025  1.010 - 1.025 Final   Blood, UA 09/01/2024 3 (AA)   Final    pH, UA 09/01/2024 5.5  5.0 - 8.0 Final   Protein, UA 09/01/2024 Negative  Negative Final   Urobilinogen, UA 09/01/2024 0.2  0.2 or 1.0 E.U./dL Final   Nitrite, UA 89/97/7974 negative   Final   Leukocytes, UA 09/01/2024 Small (1+) (A)  Negative Final   MICRO NUMBER: 09/01/2024 [id number]   Final   SPECIMEN QUALITY: 09/01/2024 Adequate   Final   Sample Source 09/01/2024 URINE   Final   STATUS: 09/01/2024 FINAL   Final   Result: 09/01/2024    Final                   Value:Mixed genital flora isolated. These superficial bacteria are not indicative of a urinary tract infection. No further organism identification is warranted on this specimen. If clinically indicated, recollect clean-catch, mid-stream urine and transfer  immediately to Urine Culture Transport Tube.    WBC, UA 09/01/2024 21-50/hpf (A)  0-2/hpf Final   RBC / HPF 09/01/2024 21-50/hpf (A)  0-2/hpf Final   Squamous Epithelial / HPF 09/01/2024 Rare(0-4/hpf)  Rare(0-4/hpf) Final   Bacteria, UA 09/01/2024 Rare(<10/hpf) (A)  None Final  Office Visit on 08/09/2024  Component Date Value Ref Range Status   Color, Urine 08/09/2024 Dark Yellow (A)  Yellow;Lt. Yellow;Straw;Dark Yellow;Amber;Green;Red;Brown Final   APPearance 08/09/2024 SL CLOUDY (A)  Clear;Turbid;Slightly Cloudy;Cloudy Final   Specific Gravity, Urine 08/09/2024 >=1.030 (A)  1.000 - 1.030 Final   pH 08/09/2024 5.0  5.0 - 8.0 Final   Total Protein, Urine 08/09/2024 100 (A)  Negative Final   Urine Glucose 08/09/2024 100 (A)  Negative Final   Ketones, ur 08/09/2024 TRACE (A)  Negative Final   Bilirubin Urine 08/09/2024 MODERATE (A)  Negative Final   Hgb urine dipstick 08/09/2024 TRACE-LYSED (A)  Negative Final   Urobilinogen, UA 08/09/2024 4.0 (A)  0.0 - 1.0 Final   Leukocytes,Ua 08/09/2024 NEGATIVE  Negative Final   Nitrite 08/09/2024 NEGATIVE  Negative Final   WBC, UA 08/09/2024 3-6/hpf (A)  0-2/hpf Final   RBC / HPF 08/09/2024 0-2/hpf  0-2/hpf Final  Mucus, UA  08/09/2024 Presence of (A)  None Final   Squamous Epithelial / HPF 08/09/2024 Many(>10/hpf) (A)  Rare(0-4/hpf) Final   Bacteria, UA 08/09/2024 Few(10-50/hpf) (A)  None Final   Granular Casts, UA 08/09/2024 Presence of (A)  None Final   Hyaline Casts, UA 08/09/2024 Presence of (A)  None Final   MICRO NUMBER: 08/09/2024 [id number]   Final   SPECIMEN QUALITY: 08/09/2024 Adequate   Final   Sample Source 08/09/2024 URINE   Final   STATUS: 08/09/2024 FINAL   Final   Result: 08/09/2024 Less than 10,000 CFU/mL of single Gram positive organism isolated. No further testing will be performed. If clinically indicated, recollection using a method to minimize contamination, with prompt transfer to Urine Culture Transport Tube, is recommended.   Final   WBC 08/09/2024 5.3  4.0 - 10.5 K/uL Final   RBC 08/09/2024 2.76 (L)  4.22 - 5.81 Mil/uL Final   Hemoglobin 08/09/2024 9.9 (L)  13.0 - 17.0 g/dL Final   HCT 90/90/7974 29.2 (L)  39.0 - 52.0 % Final   MCV 08/09/2024 105.9 (H)  78.0 - 100.0 fl Final   MCHC 08/09/2024 33.9  30.0 - 36.0 g/dL Final   RDW 90/90/7974 15.5  11.5 - 15.5 % Final   Platelets 08/09/2024 51.0 (L)  150.0 - 400.0 K/uL Final   Neutrophils Relative % 08/09/2024 51.9  43.0 - 77.0 % Final   Lymphocytes Relative 08/09/2024 18.0  12.0 - 46.0 % Final   Monocytes Relative 08/09/2024 20.8 Repeated and verified X2. (H)  3.0 - 12.0 % Final   Eosinophils Relative 08/09/2024 8.7 (H)  0.0 - 5.0 % Final   Basophils Relative 08/09/2024 0.6  0.0 - 3.0 % Final   Neutro Abs 08/09/2024 2.7  1.4 - 7.7 K/uL Final   Lymphs Abs 08/09/2024 1.0  0.7 - 4.0 K/uL Final   Monocytes Absolute 08/09/2024 1.1 (H)  0.1 - 1.0 K/uL Final   Eosinophils Absolute 08/09/2024 0.5  0.0 - 0.7 K/uL Final   Basophils Absolute 08/09/2024 0.0  0.0 - 0.1 K/uL Final   Sodium 08/09/2024 139  135 - 145 mEq/L Final   Potassium 08/09/2024 3.7  3.5 - 5.1 mEq/L Final   Chloride 08/09/2024 98  96 - 112 mEq/L Final   CO2 08/09/2024 27  19 -  32 mEq/L Final   Glucose, Bld 08/09/2024 93  70 - 99 mg/dL Final   BUN 90/90/7974 50 (H)  6 - 23 mg/dL Final   Creatinine, Ser 08/09/2024 9.15 (HH)  0.40 - 1.50 mg/dL Final   Total Bilirubin 08/09/2024 5.5 (H)  0.2 - 1.2 mg/dL Final   Alkaline Phosphatase 08/09/2024 322 (H)  39 - 117 U/L Final   AST 08/09/2024 62 (H)  0 - 37 U/L Final   ALT 08/09/2024 57 (H)  0 - 53 U/L Final   Total Protein 08/09/2024 6.5  6.0 - 8.3 g/dL Final   Albumin  08/09/2024 3.2 (L)  3.5 - 5.2 g/dL Final   GFR 90/90/7974 6.11 (LL)  >60.00 mL/min Final   Calcium  08/09/2024 9.1  8.4 - 10.5 mg/dL Final  Admission on 90/97/7974, Discharged on 08/06/2024  Component Date Value Ref Range Status   Sodium 08/02/2024 134 (L)  135 - 145 mmol/L Final   Potassium 08/02/2024 2.9 (L)  3.5 - 5.1 mmol/L Final   Chloride 08/02/2024 96 (L)  98 - 111 mmol/L Final   CO2 08/02/2024 22  22 - 32 mmol/L Final   Glucose, Bld 08/02/2024 154 (H)  70 - 99 mg/dL Final   BUN 90/97/7974 53 (H)  6 - 20 mg/dL Final   Creatinine, Ser 08/02/2024 6.94 (H)  0.61 - 1.24 mg/dL Final   Calcium  08/02/2024 10.2  8.9 - 10.3 mg/dL Final   Total Protein 90/97/7974 7.1  6.5 - 8.1 g/dL Final   Albumin  08/02/2024 3.1 (L)  3.5 - 5.0 g/dL Final   AST 90/97/7974 75 (H)  15 - 41 U/L Final   ALT 08/02/2024 56 (H)  0 - 44 U/L Final   Alkaline Phosphatase 08/02/2024 217 (H)  38 - 126 U/L Final   Total Bilirubin 08/02/2024 2.2 (H)  0.0 - 1.2 mg/dL Final   GFR, Estimated 08/02/2024 9 (L)  >60 mL/min Final   Anion gap 08/02/2024 16 (H)  5 - 15 Final   WBC 08/02/2024 9.0  4.0 - 10.5 K/uL Final   RBC 08/02/2024 3.04 (L)  4.22 - 5.81 MIL/uL Final   Hemoglobin 08/02/2024 10.7 (L)  13.0 - 17.0 g/dL Final   HCT 90/97/7974 32.1 (L)  39.0 - 52.0 % Final   MCV 08/02/2024 105.6 (H)  80.0 - 100.0 fL Final   MCH 08/02/2024 35.2 (H)  26.0 - 34.0 pg Final   MCHC 08/02/2024 33.3  30.0 - 36.0 g/dL Final   RDW 90/97/7974 15.1  11.5 - 15.5 % Final   Platelets 08/02/2024 105 (L)   150 - 400 K/uL Final   nRBC 08/02/2024 0.0  0.0 - 0.2 % Final   Color, Urine 08/02/2024 YELLOW  YELLOW Final   APPearance 08/02/2024 HAZY (A)  CLEAR Final   Specific Gravity, Urine 08/02/2024 1.009  1.005 - 1.030 Final   pH 08/02/2024 5.0  5.0 - 8.0 Final   Glucose, UA 08/02/2024 NEGATIVE  NEGATIVE mg/dL Final   Hgb urine dipstick 08/02/2024 MODERATE (A)  NEGATIVE Final   Bilirubin Urine 08/02/2024 NEGATIVE  NEGATIVE Final   Ketones, ur 08/02/2024 NEGATIVE  NEGATIVE mg/dL Final   Protein, ur 90/97/7974 100 (A)  NEGATIVE mg/dL Final   Nitrite 90/97/7974 NEGATIVE  NEGATIVE Final   Leukocytes,Ua 08/02/2024 MODERATE (A)  NEGATIVE Final   RBC / HPF 08/02/2024 0-5  0 - 5 RBC/hpf Final   WBC, UA 08/02/2024 21-50  0 - 5 WBC/hpf Final   Bacteria, UA 08/02/2024 MANY (A)  NONE SEEN Final   Squamous Epithelial / HPF 08/02/2024 0-5  0 - 5 /HPF Final   WBC Clumps 08/02/2024 PRESENT   Final   Ammonia 08/02/2024 151 (H)  9 - 35 umol/L Final   Sodium 08/02/2024 135  135 - 145 mmol/L Final   Potassium 08/02/2024 2.9 (L)  3.5 - 5.1 mmol/L Final   Chloride 08/02/2024 98  98 - 111 mmol/L Final   BUN 08/02/2024 48 (H)  6 - 20 mg/dL Final   Creatinine, Ser 08/02/2024 6.70 (H)  0.61 - 1.24 mg/dL Final   Glucose, Bld 90/97/7974 151 (H)  70 - 99 mg/dL Final   Calcium , Ion 08/02/2024 1.16  1.15 - 1.40 mmol/L Final   TCO2 08/02/2024 22  22 - 32 mmol/L Final   Hemoglobin 08/02/2024 11.2 (L)  13.0 - 17.0 g/dL Final   HCT 90/97/7974 33.0 (L)  39.0 - 52.0 % Final   Opiates 08/02/2024 NONE DETECTED  NONE DETECTED Final   Cocaine 08/02/2024 NONE DETECTED  NONE DETECTED Final   Benzodiazepines 08/02/2024 NONE DETECTED  NONE DETECTED Final   Amphetamines 08/02/2024 NONE DETECTED  NONE DETECTED Final   Tetrahydrocannabinol 08/02/2024 NONE DETECTED  NONE DETECTED Final   Barbiturates 08/02/2024 NONE DETECTED  NONE DETECTED Final   Alcohol, Ethyl (B) 08/02/2024 <15  <15 mg/dL Final   Prothrombin Time 08/02/2024 19.1  (H)  11.4 - 15.2 seconds Final   INR 08/02/2024 1.5 (H)  0.8 - 1.2 Final   Specimen Description 08/03/2024 BLOOD RIGHT ANTECUBITAL   Final   Special Requests 08/03/2024 BOTTLES DRAWN AEROBIC AND ANAEROBIC Blood Culture adequate volume   Final   Culture  Setup Time 08/03/2024    Final                   Value:GRAM POSITIVE RODS ANAEROBIC BOTTLE ONLY    Culture 08/03/2024  (A)   Final                   Value:CUTIBACTERIUM ACNES Standardized susceptibility testing for this organism is not available. Performed at [facility], [address] [place], [place] [address]    Report Status 08/03/2024 08/08/2024 FINAL   Final   Specimen Description 08/03/2024 BLOOD RIGHT ANTECUBITAL   Final   Special Requests 08/03/2024 BOTTLES DRAWN AEROBIC AND ANAEROBIC Blood Culture adequate volume   Final   Culture 08/03/2024    Final                   Value:NO GROWTH 5 DAYS Performed at [facility], [address] [place], [place] [address]    Report Status 08/03/2024 08/08/2024 FINAL   Final   Sodium 08/03/2024 134 (L)  135 - 145 mmol/L Final   Potassium 08/03/2024 3.1 (L)  3.5 - 5.1 mmol/L Final   Chloride 08/03/2024 95 (L)  98 - 111 mmol/L Final   CO2 08/03/2024 22  22 - 32 mmol/L Final   Glucose, Bld 08/03/2024 150 (H)  70 - 99 mg/dL Final   BUN 90/96/7974 57 (H)  6 - 20 mg/dL Final   Creatinine, Ser 08/03/2024 7.20 (H)  0.61 - 1.24 mg/dL Final   Calcium  08/03/2024 9.9  8.9 - 10.3 mg/dL Final   GFR, Estimated 08/03/2024 9 (L)  >60 mL/min Final   Anion gap 08/03/2024 17 (H)  5 - 15 Final   WBC 08/03/2024 7.7  4.0 - 10.5 K/uL Final   RBC 08/03/2024 2.99 (L)  4.22 - 5.81 MIL/uL Final   Hemoglobin 08/03/2024 10.8 (L)  13.0 - 17.0 g/dL Final   HCT 90/96/7974 31.4 (L)  39.0 - 52.0 % Final   MCV 08/03/2024 105.0 (H)  80.0 - 100.0 fL Final   MCH 08/03/2024 36.1 (H)  26.0 - 34.0 pg Final   MCHC 08/03/2024 34.4  30.0 - 36.0 g/dL Final   RDW 90/96/7974 15.1  11.5 - 15.5 % Final   Platelets  08/03/2024 93 (L)  150 - 400 K/uL Final   nRBC 08/03/2024 0.0  0.0 - 0.2 % Final   Magnesium  08/03/2024 2.3  1.7 - 2.4 mg/dL Final   Phosphorus 90/96/7974 6.8 (H)  2.5 - 4.6 mg/dL Final   Total Protein 90/96/7974 6.8  6.5 - 8.1 g/dL Final   Albumin  08/03/2024 3.0 (L)  3.5 - 5.0 g/dL Final   AST 90/96/7974 76 (H)  15 - 41 U/L Final   ALT 08/03/2024 54 (H)  0 - 44 U/L Final   Alkaline Phosphatase 08/03/2024 203 (H)  38 - 126 U/L Final   Total Bilirubin 08/03/2024 2.2 (H)  0.0 - 1.2 mg/dL Final   Bilirubin, Direct 08/03/2024 1.0 (H)  0.0 -  0.2 mg/dL Final   Indirect Bilirubin 08/03/2024 1.2 (H)  0.3 - 0.9 mg/dL Final   Ammonia 90/96/7974 150 (H)  9 - 35 umol/L Final   Prothrombin Time 08/03/2024 18.5 (H)  11.4 - 15.2 seconds Final   INR 08/03/2024 1.5 (H)  0.8 - 1.2 Final   HIV Screen 4th Generation wRfx 08/03/2024 Non Reactive  Non Reactive Final   Hep B S AB Quant (Post) 08/03/2024 7.0 (L)  Immunity>10 mIU/mL Final   Hepatitis B Surface Ag 08/03/2024 NON REACTIVE  NON REACTIVE Final   Sodium 08/04/2024 135  135 - 145 mmol/L Final   Potassium 08/04/2024 3.2 (L)  3.5 - 5.1 mmol/L Final   Chloride 08/04/2024 97 (L)  98 - 111 mmol/L Final   CO2 08/04/2024 22  22 - 32 mmol/L Final   Glucose, Bld 08/04/2024 167 (H)  70 - 99 mg/dL Final   BUN 90/95/7974 34 (H)  6 - 20 mg/dL Final   Creatinine, Ser 08/04/2024 5.57 (H)  0.61 - 1.24 mg/dL Final   Calcium  08/04/2024 8.9  8.9 - 10.3 mg/dL Final   GFR, Estimated 08/04/2024 12 (L)  >60 mL/min Final   Anion gap 08/04/2024 16 (H)  5 - 15 Final   WBC 08/04/2024 8.8  4.0 - 10.5 K/uL Final   RBC 08/04/2024 2.83 (L)  4.22 - 5.81 MIL/uL Final   Hemoglobin 08/04/2024 10.0 (L)  13.0 - 17.0 g/dL Final   HCT 90/95/7974 30.1 (L)  39.0 - 52.0 % Final   MCV 08/04/2024 106.4 (H)  80.0 - 100.0 fL Final   MCH 08/04/2024 35.3 (H)  26.0 - 34.0 pg Final   MCHC 08/04/2024 33.2  30.0 - 36.0 g/dL Final   RDW 90/95/7974 15.6 (H)  11.5 - 15.5 % Final   Platelets  08/04/2024 122 (L)  150 - 400 K/uL Final   nRBC 08/04/2024 0.0  0.0 - 0.2 % Final   Sodium 08/05/2024 136  135 - 145 mmol/L Final   Potassium 08/05/2024 3.5  3.5 - 5.1 mmol/L Final   Chloride 08/05/2024 98  98 - 111 mmol/L Final   CO2 08/05/2024 21 (L)  22 - 32 mmol/L Final   Glucose, Bld 08/05/2024 115 (H)  70 - 99 mg/dL Final   BUN 90/94/7974 63 (H)  6 - 20 mg/dL Final   Creatinine, Ser 08/05/2024 9.96 (H)  0.61 - 1.24 mg/dL Final   Calcium  08/05/2024 8.3 (L)  8.9 - 10.3 mg/dL Final   Phosphorus 90/94/7974 8.4 (H)  2.5 - 4.6 mg/dL Final   Albumin  08/05/2024 2.6 (L)  3.5 - 5.0 g/dL Final   GFR, Estimated 08/05/2024 6 (L)  >60 mL/min Final   Anion gap 08/05/2024 17 (H)  5 - 15 Final   WBC 08/05/2024 7.0  4.0 - 10.5 K/uL Final   RBC 08/05/2024 2.69 (L)  4.22 - 5.81 MIL/uL Final   Hemoglobin 08/05/2024 9.4 (L)  13.0 - 17.0 g/dL Final   HCT 90/94/7974 29.5 (L)  39.0 - 52.0 % Final   MCV 08/05/2024 109.7 (H)  80.0 - 100.0 fL Final   MCH 08/05/2024 34.9 (H)  26.0 - 34.0 pg Final   MCHC 08/05/2024 31.9  30.0 - 36.0 g/dL Final   RDW 90/94/7974 15.6 (H)  11.5 - 15.5 % Final   Platelets 08/05/2024 79 (L)  150 - 400 K/uL Final   nRBC 08/05/2024 0.0  0.0 - 0.2 % Final  Appointment on 07/19/2024  Component Date Value Ref Range Status  Kappa free light chain 07/19/2024 1,279.1 (H)  3.3 - 19.4 mg/L Final   Lambda free light chains 07/19/2024 45.7 (H)  5.7 - 26.3 mg/L Final   Kappa, lambda light chain ratio 07/19/2024 27.99 (H)  0.26 - 1.65 Final   IgG (Immunoglobin G), Serum 07/19/2024 659  603 - 1,613 mg/dL Final   IgA 91/80/7974 1,064 (H)  90 - 386 mg/dL Final   IgM (Immunoglobulin M), Srm 07/19/2024 83  20 - 172 mg/dL Final   Total Protein ELP 07/19/2024 6.2  6.0 - 8.5 g/dL Corrected   Albumin  SerPl Elph-Mcnc 07/19/2024 3.3  2.9 - 4.4 g/dL Corrected   Alpha 1 91/80/7974 0.3  0.0 - 0.4 g/dL Corrected   Alpha2 Glob SerPl Elph-Mcnc 07/19/2024 0.6  0.4 - 1.0 g/dL Corrected   B-Globulin SerPl  Elph-Mcnc 07/19/2024 1.6 (H)  0.7 - 1.3 g/dL Corrected   Gamma Glob SerPl Elph-Mcnc 07/19/2024 0.5  0.4 - 1.8 g/dL Corrected   M Protein SerPl Elph-Mcnc 07/19/2024 0.7 (H)  Not Observed g/dL Corrected   Globulin, Total 07/19/2024 2.9  2.2 - 3.9 g/dL Corrected   Albumin /Glob SerPl 07/19/2024 1.2  0.7 - 1.7 Corrected   IFE 1 07/19/2024 Comment (A)   Corrected   Please Note 07/19/2024 Comment   Corrected   Vitamin B-12 07/19/2024 >7,500 (H)  180 - 914 pg/mL Final   Folate 07/19/2024 20.2  >5.9 ng/mL Final   Iron 07/19/2024 132  45 - 182 ug/dL Final   TIBC 91/80/7974 388  250 - 450 ug/dL Final   Saturation Ratios 07/19/2024 34  17.9 - 39.5 % Final   UIBC 07/19/2024 256  ug/dL Final   Ferritin 91/80/7974 547 (H)  24 - 336 ng/mL Final   Sodium 07/19/2024 139  135 - 145 mmol/L Final   Potassium 07/19/2024 3.5  3.5 - 5.1 mmol/L Final   Chloride 07/19/2024 99  98 - 111 mmol/L Final   CO2 07/19/2024 25  22 - 32 mmol/L Final   Glucose, Bld 07/19/2024 152 (H)  70 - 99 mg/dL Final   BUN 91/80/7974 44 (H)  6 - 20 mg/dL Final   Creatinine 91/80/7974 6.07 (H)  0.61 - 1.24 mg/dL Final   Calcium  07/19/2024 9.7  8.9 - 10.3 mg/dL Final   Total Protein 91/80/7974 6.5  6.5 - 8.1 g/dL Final   Albumin  07/19/2024 3.4 (L)  3.5 - 5.0 g/dL Final   AST 91/80/7974 87 (H)  15 - 41 U/L Final   ALT 07/19/2024 84 (H)  0 - 44 U/L Final   Alkaline Phosphatase 07/19/2024 286 (H)  38 - 126 U/L Final   Total Bilirubin 07/19/2024 2.1 (H)  0.0 - 1.2 mg/dL Final   GFR, Estimated 07/19/2024 10 (L)  >60 mL/min Final   Anion gap 07/19/2024 15  5 - 15 Final   WBC Count 07/19/2024 4.8  4.0 - 10.5 K/uL Final   RBC 07/19/2024 2.65 (L)  4.22 - 5.81 MIL/uL Final   Hemoglobin 07/19/2024 9.6 (L)  13.0 - 17.0 g/dL Final   HCT 91/80/7974 28.8 (L)  39.0 - 52.0 % Final   MCV 07/19/2024 108.7 (H)  80.0 - 100.0 fL Final   MCH 07/19/2024 36.2 (H)  26.0 - 34.0 pg Final   MCHC 07/19/2024 33.3  30.0 - 36.0 g/dL Final   RDW 91/80/7974 15.6  (H)  11.5 - 15.5 % Final   Platelet Count 07/19/2024 58 (L)  150 - 400 K/uL Final   nRBC 07/19/2024 0.0  0.0 -  0.2 % Final   Neutrophils Relative % 07/19/2024 60  % Final   Neutro Abs 07/19/2024 2.9  1.7 - 7.7 K/uL Final   Lymphocytes Relative 07/19/2024 21  % Final   Lymphs Abs 07/19/2024 1.0  0.7 - 4.0 K/uL Final   Monocytes Relative 07/19/2024 13  % Final   Monocytes Absolute 07/19/2024 0.6  0.1 - 1.0 K/uL Final   Eosinophils Relative 07/19/2024 4  % Final   Eosinophils Absolute 07/19/2024 0.2  0.0 - 0.5 K/uL Final   Basophils Relative 07/19/2024 1  % Final   Basophils Absolute 07/19/2024 0.0  0.0 - 0.1 K/uL Final   Immature Granulocytes 07/19/2024 1  % Final   Abs Immature Granulocytes 07/19/2024 0.05  0.00 - 0.07 K/uL Final  Office Visit on 06/16/2024  Component Date Value Ref Range Status   Color, Urine 06/16/2024 DARK YELLOW  YELLOW Final   APPearance 06/16/2024 TURBID (A)  CLEAR Final   Specific Gravity, Urine 06/16/2024 1.019  1.001 - 1.035 Final   pH 06/16/2024 5.5  5.0 - 8.0 Final   Glucose, UA 06/16/2024 NEGATIVE  NEGATIVE Final   Bilirubin Urine 06/16/2024 NEGATIVE  NEGATIVE Final   Ketones, ur 06/16/2024 TRACE (A)  NEGATIVE Final   Hgb urine dipstick 06/16/2024 TRACE (A)  NEGATIVE Final   Protein, ur 06/16/2024 2+ (A)  NEGATIVE Final   Nitrites, Initial 06/16/2024 NEGATIVE  NEGATIVE Final   Leukocyte Esterase 06/16/2024 3+ (A)  NEGATIVE Final   WBC, UA 06/16/2024 > OR = 60 (A)  0 - 5 /HPF Final   RBC / HPF 06/16/2024 NONE SEEN  0 - 2 /HPF Final   Squamous Epithelial / HPF 06/16/2024 0-5  < OR = 5 /HPF Final   Bacteria, UA 06/16/2024 MANY (A)  NONE SEEN /HPF Final   Hyaline Cast 06/16/2024 NONE SEEN  NONE SEEN /LPF Final   Note 06/16/2024    Final   MICRO NUMBER: 06/16/2024 [id number]   Final   SPECIMEN QUALITY: 06/16/2024 Adequate   Final   Sample Source 06/16/2024 URINE   Final   STATUS: 06/16/2024 FINAL   Final   ISOLATE 1: 06/16/2024 Klebsiella pneumoniae (A)    Final   REFLEXIVE URINE CULTURE 06/16/2024    Final  Admission on 05/06/2024, Discharged on 05/06/2024  Component Date Value Ref Range Status   Sodium 05/06/2024 135  135 - 145 mmol/L Final   Potassium 05/06/2024 3.7  3.5 - 5.1 mmol/L Final   Chloride 05/06/2024 101  98 - 111 mmol/L Final   BUN 05/06/2024 38 (H)  6 - 20 mg/dL Final   Creatinine, Ser 05/06/2024 7.50 (H)  0.61 - 1.24 mg/dL Final   Glucose, Bld 93/93/7974 166 (H)  70 - 99 mg/dL Final   Calcium , Ion 05/06/2024 1.02 (L)  1.15 - 1.40 mmol/L Final   TCO2 05/06/2024 23  22 - 32 mmol/L Final   Hemoglobin 05/06/2024 11.2 (L)  13.0 - 17.0 g/dL Final   HCT 93/93/7974 33.0 (L)  39.0 - 52.0 % Final  Scanned Document on 03/24/2024  Component Date Value Ref Range Status   Creatinine, POC 05/30/2023 95.2  mg/dL Final   EGFR 93/70/7975 18.0   Final  Office Visit on 03/14/2024  Component Date Value Ref Range Status   Cholesterol 03/14/2024 208 (H)  0 - 200 mg/dL Final   Triglycerides 95/85/7974 167.0 (H)  0.0 - 149.0 mg/dL Final   HDL 95/85/7974 50.60  >39.00 mg/dL Final   VLDL 95/85/7974 33.4  0.0 -  40.0 mg/dL Final   LDL Cholesterol 03/14/2024 124 (H)  0 - 99 mg/dL Final   Total CHOL/HDL Ratio 03/14/2024 4   Final   NonHDL 03/14/2024 157.38   Final   Sodium 03/14/2024 137  135 - 145 mEq/L Final   Potassium 03/14/2024 4.3  3.5 - 5.1 mEq/L Final   Chloride 03/14/2024 98  96 - 112 mEq/L Final   CO2 03/14/2024 22  19 - 32 mEq/L Final   Glucose, Bld 03/14/2024 152 (H)  70 - 99 mg/dL Final   BUN 95/85/7974 69 (H)  6 - 23 mg/dL Final   Creatinine, Ser 03/14/2024 10.96 (HH)  0.40 - 1.50 mg/dL Final   Total Bilirubin 03/14/2024 1.6 (H)  0.2 - 1.2 mg/dL Final   Alkaline Phosphatase 03/14/2024 244 (H)  39 - 117 U/L Final   AST 03/14/2024 55 (H)  0 - 37 U/L Final   ALT 03/14/2024 64 (H)  0 - 53 U/L Final   Total Protein 03/14/2024 6.1  6.0 - 8.3 g/dL Final   Albumin  03/14/2024 3.3 (L)  3.5 - 5.2 g/dL Final   GFR 95/85/7974 4.94 (LL)   >60.00 mL/min Final   Calcium  03/14/2024 9.2  8.4 - 10.5 mg/dL Final   WBC 95/85/7974 7.8  4.0 - 10.5 K/uL Final   RBC 03/14/2024 3.07 (L)  4.22 - 5.81 Mil/uL Final   Hemoglobin 03/14/2024 11.0 (L)  13.0 - 17.0 g/dL Final   HCT 95/85/7974 32.0 (L)  39.0 - 52.0 % Final   MCV 03/14/2024 104.2 (H)  78.0 - 100.0 fl Final   MCHC 03/14/2024 34.4  30.0 - 36.0 g/dL Final   RDW 95/85/7974 15.9 (H)  11.5 - 15.5 % Final   Platelets 03/14/2024 89.0 (L)  150.0 - 400.0 K/uL Final   Neutrophils Relative % 03/14/2024 59.4  43.0 - 77.0 % Final   Lymphocytes Relative 03/14/2024 17.6  12.0 - 46.0 % Final   Monocytes Relative 03/14/2024 16.0 (H)  3.0 - 12.0 % Final   Eosinophils Relative 03/14/2024 6.6 (H)  0.0 - 5.0 % Final   Basophils Relative 03/14/2024 0.4  0.0 - 3.0 % Final   Neutro Abs 03/14/2024 4.6  1.4 - 7.7 K/uL Final   Lymphs Abs 03/14/2024 1.4  0.7 - 4.0 K/uL Final   Monocytes Absolute 03/14/2024 1.2 (H)  0.1 - 1.0 K/uL Final   Eosinophils Absolute 03/14/2024 0.5  0.0 - 0.7 K/uL Final   Basophils Absolute 03/14/2024 0.0  0.0 - 0.1 K/uL Final   TSH 03/14/2024 2.070  0.450 - 4.500 uIU/mL Final   Hgb A1c MFr Bld 03/14/2024 6.1  4.6 - 6.5 % Final   Magnesium  03/14/2024 2.4  1.5 - 2.5 mg/dL Final   Phosphorus 95/85/7974 8.1 (H)  2.3 - 4.6 mg/dL Final  Appointment on 97/82/7974  Component Date Value Ref Range Status   WBC Count 01/18/2024 7.4  4.0 - 10.5 K/uL Final   RBC 01/18/2024 3.33 (L)  4.22 - 5.81 MIL/uL Final   Hemoglobin 01/18/2024 11.4 (L)  13.0 - 17.0 g/dL Final   HCT 97/82/7974 33.4 (L)  39.0 - 52.0 % Final   MCV 01/18/2024 100.3 (H)  80.0 - 100.0 fL Final   MCH 01/18/2024 34.2 (H)  26.0 - 34.0 pg Final   MCHC 01/18/2024 34.1  30.0 - 36.0 g/dL Final   RDW 97/82/7974 14.8  11.5 - 15.5 % Final   Platelet Count 01/18/2024 70 (L)  150 - 400 K/uL Final   nRBC 01/18/2024  0.00  0.0 - 0.2 % Final   Neutrophils Relative % 01/18/2024 48  % Final   Neutro Abs 01/18/2024 3.6  1.7 - 7.7 K/uL  Final   Lymphocytes Relative 01/18/2024 26  % Final   Lymphs Abs 01/18/2024 1.9  0.7 - 4.0 K/uL Final   Monocytes Relative 01/18/2024 16  % Final   Monocytes Absolute 01/18/2024 1.2 (H)  0.1 - 1.0 K/uL Final   Eosinophils Relative 01/18/2024 8  % Final   Eosinophils Absolute 01/18/2024 0.6 (H)  0.0 - 0.5 K/uL Final   Basophils Relative 01/18/2024 1  % Final   Basophils Absolute 01/18/2024 0.1  0.0 - 0.1 K/uL Final   Immature Granulocytes 01/18/2024 1  % Final   Abs Immature Granulocytes 01/18/2024 0.07  0.00 - 0.07 K/uL Final   nRBC 01/18/2024 0  0 /100 WBC Final   Immature Platelet Fraction 01/18/2024 0.3 (L)  1.2 - 8.6 % Final   Ferritin 01/18/2024 114  24 - 336 ng/mL Final   Iron 01/18/2024 152  45 - 182 ug/dL Final   TIBC 97/82/7974 456 (H)  250 - 450 ug/dL Final   Saturation Ratios 01/18/2024 33  17.9 - 39.5 % Final   UIBC 01/18/2024 304  ug/dL Final   Vitamin A-87 97/82/7974 6,881 (H)  180 - 914 pg/mL Final   IgG (Immunoglobin G), Serum 01/18/2024 561 (L)  603 - 1,613 mg/dL Final   IgA 97/82/7974 1,203 (H)  90 - 386 mg/dL Final   IgM (Immunoglobulin M), Srm 01/18/2024 91  20 - 172 mg/dL Final   Total Protein ELP 01/18/2024 5.9 (L)  6.0 - 8.5 g/dL Corrected   Albumin  SerPl Elph-Mcnc 01/18/2024 2.8 (L)  2.9 - 4.4 g/dL Corrected   Alpha 1 97/82/7974 0.3  0.0 - 0.4 g/dL Corrected   Alpha2 Glob SerPl Elph-Mcnc 01/18/2024 0.6  0.4 - 1.0 g/dL Corrected   B-Globulin SerPl Elph-Mcnc 01/18/2024 1.7 (H)  0.7 - 1.3 g/dL Corrected   Gamma Glob SerPl Elph-Mcnc 01/18/2024 0.4  0.4 - 1.8 g/dL Corrected   M Protein SerPl Elph-Mcnc 01/18/2024 0.9 (H)  Not Observed g/dL Corrected   Globulin, Total 01/18/2024 3.1  2.2 - 3.9 g/dL Corrected   Albumin /Glob SerPl 01/18/2024 1.0  0.7 - 1.7 Corrected   IFE 1 01/18/2024 Comment (A)   Corrected   Please Note 01/18/2024 Comment   Corrected   Kappa free light chain 01/18/2024 1,099.9 (H)  3.3 - 19.4 mg/L Final   Lambda free light chains 01/18/2024 33.2 (H)   5.7 - 26.3 mg/L Final   Kappa, lambda light chain ratio 01/18/2024 33.13 (H)  0.26 - 1.65 Final  There may be more visits with results that are not included.  No image results found. US  Abdomen Complete Result Date: 08/04/2024 CLINICAL DATA:  Encephalopathy.  Cirrhosis of liver. EXAM: ABDOMEN ULTRASOUND COMPLETE COMPARISON:  Right upper quadrant ultrasound 07/14/2024 FINDINGS: Gallbladder: Surgically absent. Common bile duct: Diameter: 6.8 mm Liver: No focal lesion identified. Decrease in parenchymal echogenicity. Portal vein is patent on color Doppler imaging with normal direction of blood flow towards the liver. IVC: No abnormality visualized.  Limited evaluation. Pancreas: Not well seen secondary to overlying bowel gas. Spleen: Size and appearance within normal limits.  10.5 cm. Right Kidney: Length: 8.1 cm. Limited evaluation. Echogenicity within normal limits. No mass or hydronephrosis visualized. Left Kidney: Length: 7.5 cm. Limited evaluation. Echogenicity within normal limits. No mass or hydronephrosis visualized. Abdominal aorta: No aneurysm visualized.  1.6 cm. Other findings:  None. IMPRESSION: 1. Decrease in parenchymal echogenicity of the liver. This is a nonspecific finding but can be seen in the setting of hepatitis. 2. Status post cholecystectomy. 3. Limited evaluation of the pancreas and kidneys. Electronically Signed   By: Greig Pique M.D.   On: 08/04/2024 22:00   CT Head Wo Contrast Result Date: 08/03/2024 EXAM: CT HEAD WITHOUT CONTRAST 08/03/2024 12:54:53 AM TECHNIQUE: CT of the head was performed without the administration of intravenous contrast. Automated exposure control, iterative reconstruction, and/or weight based adjustment of the mA/kV was utilized to reduce the radiation dose to as low as reasonably achievable. COMPARISON: None available. CLINICAL HISTORY: Mental status change, unknown cause. AMS. FINDINGS: BRAIN AND VENTRICLES: No acute hemorrhage. No evidence of acute infarct.  No hydrocephalus. No extra-axial collection. No mass effect or midline shift. ORBITS: No acute abnormality. SINUSES: No acute abnormality. SOFT TISSUES AND SKULL: No acute soft tissue abnormality. No skull fracture. IMPRESSION: 1. No acute intracranial abnormality. Electronically signed by: Franky Stanford MD 08/03/2024 12:58 AM EDT RP Workstation: [id number]   US  Abdomen Limited RUQ (LIVER/GB) Result Date: 07/18/2024 CLINICAL DATA:  MASH.  Advanced fibrosis. EXAM: ULTRASOUND ABDOMEN LIMITED RIGHT UPPER QUADRANT COMPARISON:  12/24/2023 FINDINGS: Gallbladder: Status post cholecystectomy Common bile duct: Diameter: 8 mm Liver: Parenchymal echogenicity: Diffusely increased Contours: Normal Lesions: None Portal vein: Patent.  Hepatopetal flow Other: None. IMPRESSION: Diffuse increased echogenicity of the hepatic parenchyma is a nonspecific indicator of hepatocellular dysfunction, most commonly steatosis. Electronically Signed   By: Aliene Lloyd M.D.   On: 07/18/2024 09:11         ASSESSMENT & PLAN   Assessment & Plan Hiatal hernia with GERD and esophagitis Non-intractable cyclical vomiting with nausea Chronic nausea and vomiting with gastroesophageal reflux disease and suspected hiatal hernia   Chronic nausea and vomiting persist despite Voquezna  reducing acid-related symptoms. Financial constraints limit long-term Voquezna  use. 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 Continue Voquezna  for one more month, then taper and switch to Protonix . Ordered a CT scan of the abdomen and pelvis to evaluate for a hiatal hernia. Referred to a bariatric surgeon for potential surgical intervention and to GI for EGD to assess for esophageal cancer. Advised to flush the esophagus with fluids and use antacids like Tums or Mylanta after vomiting.  Previously we presumed this was due to bun elevation but it actually preceded it. Further discussion revealed he was once told he had hiatal hernia  but documentaiton of it was minimal in chart. Ectatic aorta Noted on imaging. Was advised 5 years follow up 2021 so almost due and we are struggling with concern(s) of possible hiatal hernia and recurrent nausea and vomiting so will get CT abdomen while awaiting esophagoduodenoscopy evaluate by his gastrointestinal specialist  Aspiration pneumonia of right lower lobe due to gastric secretions (HCC) Only a croupy cough at the moment, stable vitals, no fevers, will manage as outpatient He declined / deferred / expressed a preference to not move forward with CXR confirmation  Allergy to Augmentin/doxycycline -> Z-pack.Suspected due to chronic vomiting and cough, with crackles in the right lower lobe indicating pneumonia. Dialysis-related immunocompromise increases infection risk discussed  Ingrown toenail Referred to podiatry per patient request  Causing pain, previous nail specialist management insufficient. Referred to a podiatrist for ingrown toenail management. ESRD (end stage renal disease) (HCC) Managed with dialysis. Financial constraints and insurance issues affect healthcare access. Continue dialysis and maintain the current insurance plan to avoid high out-of-pocket costs. Coronary artery disease  involving native coronary artery of native heart without angina pectoris Coronary artery disease with CABG. Material hardship due to limited financial resources Got disability but income caused him to lose medicaid; still working on [employer]. Gave Voquezna  samples and protonix  with guidance to try to come off soon or he will need esophagoduodenoscopy/hiatal hernia repair.  ORDER ASSOCIATIONS  #   DIAGNOSIS / CONDITION ICD-10 ENCOUNTER ORDER     ICD-10-CM   1. Hiatal hernia with GERD and esophagitis  K44.9 tirzepatide  (ZEPBOUND ) 2.5 MG/0.5ML Pen   K21.00 Amb Referral to Bariatric Surgery    pantoprazole  (PROTONIX ) 40 MG tablet    CT ABDOMEN PELVIS WO CONTRAST    Ambulatory referral to  Gastroenterology    CANCELED: Ambulatory referral to Gastroenterology    2. Ectatic aorta  I77.819 CT ABDOMEN PELVIS WO CONTRAST    3. Aspiration pneumonia of right lower lobe due to gastric secretions (HCC)  J69.0 azithromycin  (ZITHROMAX ) 250 MG tablet    Ambulatory referral to Gastroenterology    DISCONTINUED: azithromycin  (ZITHROMAX ) 250 MG tablet    CANCELED: Ambulatory referral to Gastroenterology    4. Ingrown toenail  L60.0 Ambulatory referral to Podiatry    5. Non-intractable cyclical vomiting with nausea  R11.15     6. ESRD (end stage renal disease) (HCC)  N18.6     7. Coronary artery disease involving native coronary artery of native heart without angina pectoris  I25.10            Orders Placed in Encounter:   Lab Orders  No laboratory test(s) ordered today   Imaging Orders         CT ABDOMEN PELVIS WO CONTRAST     Referral Orders         Amb Referral to Bariatric Surgery         Ambulatory referral to Podiatry         Ambulatory referral to Gastroenterology     Meds ordered this encounter  Medications   tirzepatide  (ZEPBOUND ) 2.5 MG/0.5ML Pen    Sig: Inject 2.5 mg into the skin once a week.    Dispense:  2 mL    Refill:  11   pantoprazole  (PROTONIX ) 40 MG tablet    Sig: Take 2 tablets (80 mg total) by mouth daily.    Dispense:  60 tablet    Refill:  3   DISCONTD: azithromycin  (ZITHROMAX ) 250 MG tablet    Sig: Take 2 tablets on day 1, then 1 tablet daily on days 2 through 5    Dispense:  6 tablet    Refill:  0   azithromycin  (ZITHROMAX ) 250 MG tablet    Sig: Take 2 tablets on day 1, then 1 tablet daily on days 2 through 5    Dispense:  6 tablet    Refill:  0    Orders Placed This Encounter  Procedures   CT ABDOMEN PELVIS WO CONTRAST    hiatal hernia follow up, uncontrollable heartburn.surgical planning bariatric surgery and hiatal hernia repair, also follow up on aortic ectasia vs aneurysm [employer] P[employer]    Standing Status:   Future    Expiration  Date:   10/11/2025    Preferred imaging location?:   [facility]    If indicated for the ordered procedure, I authorize the administration of oral contrast media per Radiology protocol:   Yes    Does the patient have a contrast media/X-ray dye allergy?:   No   Amb Referral to Bariatric  Surgery    Referral Priority:   Routine    Referral Type:   Consultation    Number of Visits Requested:   1   Ambulatory referral to Podiatry    Referral Priority:   Routine    Referral Type:   Consultation    Referral Reason:   Specialty Services Required    Requested Specialty:   Podiatry    Number of Visits Requested:   1   Ambulatory referral to Gastroenterology    Referral Priority:   Routine    Referral Type:   Consultation    Referral Reason:   Specialty Services Required    Referred to Provider:   May, Deanna J, NP    Number of Visits Requested:   1      This document was synthesized by artificial intelligence ([employer]) using HIPAA-compliant recording of the clinical interaction;   We discussed the use of AI scribe software for clinical note transcription with the patient, who gave verbal consent to proceed. additional Info: This encounter employed state-of-the-art, real-time, collaborative documentation. The patient actively reviewed and assisted in updating their electronic medical record on a shared screen, ensuring transparency and facilitating joint problem-solving for the problem list, overview, and plan. This approach promotes accurate, informed care. The treatment plan was discussed and reviewed in detail, including medication safety, potential side effects, and all patient questions. We confirmed understanding and comfort with the plan. Follow-up instructions were established, including contacting the office for any concerns, returning if symptoms worsen, persist, or new symptoms develop, and precautions for potential emergency department visits.

## 2024-10-11 NOTE — Patient Instructions (Addendum)
 Team Member Role and Visual Merchandiser Info Address Start End Comments  May, Deanna J, NP Nurse Practitioner (Gastroenterology) Phone: [phone] Fax: [phone] [address] [place] [address] 09/08/2024 - -  It was a pleasure seeing you today! Your health and satisfaction are our top priorities.  Bernardino Cone, MD  VISIT SUMMARY: During your visit, we discussed your ongoing issues with nausea and vomiting, as well as your persistent cough. We also reviewed your history of coronary artery disease and other health concerns. We have made some adjustments to your medications and planned further tests to better understand and manage your symptoms.  YOUR PLAN: -ASPIRATION PNEUMONIA: Aspiration pneumonia is a lung infection that occurs when you inhale food, liquid, or vomit into your lungs. Due to your chronic vomiting and cough, you are at risk. We have prescribed a Z-Pak (antibiotic) and ordered a chest x-ray to confirm the diagnosis.  -CHRONIC NAUSEA AND VOMITING WITH GASTROESOPHAGEAL REFLUX DISEASE AND SUSPECTED HIATAL HERNIA: Your ongoing nausea and vomiting are likely related to acid reflux and a possible hiatal hernia. We will continue your current medication, Voquezna , for one more month before switching to Protonix . We have also ordered a CT scan to check for a hiatal hernia and referred you to specialists for further evaluation and potential surgery. Please remember to flush your esophagus with fluids and use antacids like Tums or Mylanta after vomiting.  -END STAGE RENAL DISEASE ON DIALYSIS: End stage renal disease means your kidneys are no longer able to work as they should, and you require dialysis. Continue with your dialysis treatments and maintain your current insurance plan to manage costs.  -HISTORY OF CORONARY ARTERY DISEASE, STATUS POST CORONARY ARTERY BYPASS GRAFT: Coronary artery disease is a condition where the blood vessels supplying your heart are narrowed or blocked.  You had surgery in 2017 to bypass a blockage. Continue to monitor your heart health and follow your current treatment plan.  -AORTIC ECTASIA: Aortic ectasia is a condition where the aorta, the main artery in your body, is slightly enlarged. We will continue to monitor this condition.  -INGROWING NAIL: An ingrown toenail occurs when the edge of your toenail grows into the skin, causing pain. We have referred you to a podiatrist for further management.  INSTRUCTIONS: Please follow up with the chest x-ray and CT scan as ordered. Continue taking your medications as prescribed and attend your specialist appointments for further evaluation. If you experience any worsening symptoms, contact our office immediately.  Your Providers PCP: Cone Bernardino MATSU, MD,  [phone]) Referring Provider: Cone Bernardino MATSU, MD,  [phone]) Care Team Provider: Santo Stanly LABOR, MD,  [phone]) Care Team Provider: Alvia Olam BIRCH, RN Care Team Provider: Ezzard Valaria LABOR, MD,  [phone]) Care Team Provider: Tobie Gordy POUR, MD,  [phone]) Care Team Provider: [facility],  [phone]) Care Team Provider: May, Deanna J, NP,  [phone]) Care Team Provider: Merlynn Lyle CROME, LCSW  NEXT STEPS: [x]  Early Intervention: Schedule sooner appointment, call our on-call services, or go to emergency room if there is any significant Increase in pain or discomfort New or worsening symptoms Sudden or severe changes in your health [x]  Flexible Follow-Up: We recommend a Return for f. for optimal routine care. This allows for progress monitoring and treatment adjustments. [x]  Preventive Care: Schedule your annual preventive care visit! It's typically covered by insurance and helps identify potential health issues early. [x]  Lab & X-ray Appointments: Incomplete tests scheduled today, or call to schedule. X-rays: [facility]  at [place] (M-F, 8:30am-noon or 1pm-5pm). [x]  Medical  Information Release: Sign a release form at front desk to obtain relevant medical information we don't have.  MAKING THE MOST OF OUR FOCUSED 20 MINUTE APPOINTMENTS: [x]   Clearly state your top concerns at the beginning of the visit to focus our discussion [x]   If you anticipate you will need more time, please inform the front desk during scheduling - we can book multiple appointments in the same week. [x]   If you have transportation problems- use our convenient video appointments or ask about transportation support. [x]   We can get down to business faster if you use MyChart to update information before the visit and submit non-urgent questions before your visit. Thank you for taking the time to provide details through MyChart.  Let our nurse know and she can import this information into your encounter documents.  Arrival and Wait Times: [x]   Arriving on time ensures that everyone receives prompt attention. [x]   Early morning (8a) and afternoon (1p) appointments tend to have shortest wait times. [x]   Unfortunately, we cannot delay appointments for late arrivals or hold slots during phone calls.  Getting Answers and Following Up [x]   Simple Questions & Concerns: For quick questions or basic follow-up after your visit, reach us  at [phone] or MyChart messaging. [x]   Complex Concerns: If your concern is more complex, scheduling an appointment might be best. Discuss this with the staff to find the most suitable option. [x]   Lab & Imaging Results: We'll contact you directly if results are abnormal or you don't use MyChart. Most normal results will be on MyChart within 2-3 business days, with a review message from Dr. Jesus. Haven't heard back in 2 weeks? Need results sooner? Contact us  at [phone]. [x]   Referrals: Our referral coordinator will manage specialist referrals. The specialist's office should contact you within 2 weeks to schedule an appointment. Call us  if you haven't heard from  them after 2 weeks.  Staying Connected [x]   MyChart: Activate your MyChart for the fastest way to access results and message us . See the last page of this paperwork for instructions on how to activate.  Bring to Your Next Appointment [x]   Medications: Please bring all your medication bottles to your next appointment to ensure we have an accurate record of your prescriptions. [x]   Health Diaries: If you're monitoring any health conditions at home, keeping a diary of your readings can be very helpful for discussions at your next appointment.  Billing [x]   X-ray & Lab Orders: These are billed by separate companies. Contact the invoicing company directly for questions or concerns. [x]   Visit Charges: Discuss any billing inquiries with our administrative services team.  Your Satisfaction Matters [x]   Share Your Experience: We strive for your satisfaction! If you have any complaints, or preferably compliments, please let Dr. Jesus know directly or contact our Practice Administrators, Manuelita Rubin or [employer], by asking at the front desk.   Reviewing Your Records [x]   Review this early draft of your clinical encounter notes below and the final encounter summary tomorrow on MyChart after its been completed.  All orders placed so far are visible here: Hiatal hernia with GERD and esophagitis -     Tirzepatide -Weight Management; Inject 2.5 mg into the skin once a week.  Dispense: 2 mL; Refill: 11 -     Amb Referral to Bariatric Surgery -     Pantoprazole  Sodium; Take 2 tablets (80 mg total) by mouth daily.  Dispense: 60 tablet; Refill: 3 -     CT ABDOMEN PELVIS WO CONTRAST; Future -     Ambulatory referral to Gastroenterology  Ectatic aorta -     CT ABDOMEN PELVIS WO CONTRAST; Future  Aspiration pneumonia of right lower lobe due to gastric secretions (HCC) -     Azithromycin ; Take 2 tablets on day 1, then 1 tablet daily on days 2 through 5  Dispense: 6 tablet; Refill: 0 -     Ambulatory  referral to Gastroenterology  Ingrown toenail -     Ambulatory referral to Podiatry  Non-intractable cyclical vomiting with nausea  ESRD (end stage renal disease) (HCC)  Coronary artery disease involving native coronary artery of native heart without angina pectoris

## 2024-10-11 NOTE — Assessment & Plan Note (Signed)
 Noted on imaging. Was advised 5 years follow up 2021 so almost due and we are struggling with concern(s) of possible hiatal hernia and recurrent nausea and vomiting so will get CT abdomen while awaiting esophagoduodenoscopy evaluate by his gastrointestinal specialist

## 2024-10-11 NOTE — Assessment & Plan Note (Signed)
 Coronary artery disease with CABG.

## 2024-10-11 NOTE — Assessment & Plan Note (Signed)
 Managed with dialysis. Financial constraints and insurance issues affect healthcare access. Continue dialysis and maintain the current insurance plan to avoid high out-of-pocket costs.

## 2024-10-12 ENCOUNTER — Telehealth: Payer: Self-pay

## 2024-10-12 DIAGNOSIS — Z992 Dependence on renal dialysis: Secondary | ICD-10-CM | POA: Diagnosis not present

## 2024-10-12 DIAGNOSIS — N186 End stage renal disease: Secondary | ICD-10-CM | POA: Diagnosis not present

## 2024-10-12 DIAGNOSIS — L299 Pruritus, unspecified: Secondary | ICD-10-CM | POA: Diagnosis not present

## 2024-10-12 DIAGNOSIS — R52 Pain, unspecified: Secondary | ICD-10-CM | POA: Diagnosis not present

## 2024-10-12 DIAGNOSIS — N2581 Secondary hyperparathyroidism of renal origin: Secondary | ICD-10-CM | POA: Diagnosis not present

## 2024-10-13 ENCOUNTER — Other Ambulatory Visit: Payer: Self-pay

## 2024-10-13 ENCOUNTER — Telehealth: Payer: Self-pay | Admitting: Pharmacist

## 2024-10-13 NOTE — Telephone Encounter (Signed)
 Appeal has been submitted. Will advise when response is received or follow up in 1 week. Please be advised that most companies may take 30 days to make a decision. Appeal letter and supporting documentation have been faxed to [phone] on 10/13/2024 @9 :24 am.  Thank you, Devere Pandy, PharmD Clinical Pharmacist  [place]  Direct Dial : [phone]

## 2024-10-13 NOTE — Patient Instructions (Signed)
 Visit Information  Thank you for taking time to visit with me today. Please don't hesitate to contact me if I can be of assistance to you before our next scheduled appointment.   Please call the care guide team at [phone] if you need to cancel, schedule, or reschedule an appointment.   Please call 911 if you are experiencing a Mental Health or Behavioral Health Crisis or need someone to talk to.  Sean [name], BSW [place]  [facility], [facility] Social Worker Direct Dial: [phone]  Fax: [phone] Website: [email or web address]

## 2024-10-13 NOTE — Patient Outreach (Signed)
 Social Drivers of Health  Community Resource and Care Coordination Visit Note   10/13/2024  Name: Sean [name] MRN: [id number] DOB:1971/02/02  Situation: Referral received for [facility] needs assessment and assistance related to Financial Strain . I obtained verbal consent from Parent.  Visit completed with Patient on the phone.   Background:   SDOH Interventions Today    Flowsheet Row Most Recent Value  SDOH Interventions   Financial Strain Interventions Intervention Not Indicated  [Patient is not in crisis with bills and lives with his parents, but owes credit cards.  SW provided budget counseling to pay priority bills first with $2100 income.]     Assessment:   Goals Addressed             This Visit's Progress    BSW Goals       Current SDOH Barriers:  Financial constraints related to transitioning from working to disability payments.  Interventions: Patient interviewed and appropriate screenings performed Advised patient to pay priority essential bills first. Patient lives with family and is not in crisis.          Recommendation:   Patient to pay priority bills first.  Follow Up Plan:   Patient has achieved all patient stated goals. [employer] will be closed. Patient has been provided contact information should new needs arise.   Tillman Gardener, BSW [place]  [facility], [facility] Social Worker Direct Dial : [phone]  Fax: [phone] Website: [email or web address]

## 2024-10-14 DIAGNOSIS — N186 End stage renal disease: Secondary | ICD-10-CM | POA: Diagnosis not present

## 2024-10-14 DIAGNOSIS — L299 Pruritus, unspecified: Secondary | ICD-10-CM | POA: Diagnosis not present

## 2024-10-14 DIAGNOSIS — Z992 Dependence on renal dialysis: Secondary | ICD-10-CM | POA: Diagnosis not present

## 2024-10-14 DIAGNOSIS — R52 Pain, unspecified: Secondary | ICD-10-CM | POA: Diagnosis not present

## 2024-10-14 DIAGNOSIS — N2581 Secondary hyperparathyroidism of renal origin: Secondary | ICD-10-CM | POA: Diagnosis not present

## 2024-10-17 DIAGNOSIS — N186 End stage renal disease: Secondary | ICD-10-CM | POA: Diagnosis not present

## 2024-10-17 DIAGNOSIS — Z992 Dependence on renal dialysis: Secondary | ICD-10-CM | POA: Diagnosis not present

## 2024-10-17 DIAGNOSIS — R52 Pain, unspecified: Secondary | ICD-10-CM | POA: Diagnosis not present

## 2024-10-17 DIAGNOSIS — L299 Pruritus, unspecified: Secondary | ICD-10-CM | POA: Diagnosis not present

## 2024-10-17 DIAGNOSIS — N2581 Secondary hyperparathyroidism of renal origin: Secondary | ICD-10-CM | POA: Diagnosis not present

## 2024-10-19 DIAGNOSIS — L299 Pruritus, unspecified: Secondary | ICD-10-CM | POA: Diagnosis not present

## 2024-10-19 DIAGNOSIS — R52 Pain, unspecified: Secondary | ICD-10-CM | POA: Diagnosis not present

## 2024-10-19 DIAGNOSIS — N2581 Secondary hyperparathyroidism of renal origin: Secondary | ICD-10-CM | POA: Diagnosis not present

## 2024-10-19 DIAGNOSIS — T782XXA Anaphylactic shock, unspecified, initial encounter: Secondary | ICD-10-CM | POA: Insufficient documentation

## 2024-10-19 DIAGNOSIS — T7840XA Allergy, unspecified, initial encounter: Secondary | ICD-10-CM | POA: Insufficient documentation

## 2024-10-19 DIAGNOSIS — Z992 Dependence on renal dialysis: Secondary | ICD-10-CM | POA: Diagnosis not present

## 2024-10-19 DIAGNOSIS — N186 End stage renal disease: Secondary | ICD-10-CM | POA: Diagnosis not present

## 2024-10-20 DIAGNOSIS — N186 End stage renal disease: Secondary | ICD-10-CM | POA: Diagnosis not present

## 2024-10-20 DIAGNOSIS — Z992 Dependence on renal dialysis: Secondary | ICD-10-CM | POA: Diagnosis not present

## 2024-10-21 ENCOUNTER — Other Ambulatory Visit (HOSPITAL_COMMUNITY): Payer: Self-pay

## 2024-10-21 DIAGNOSIS — N186 End stage renal disease: Secondary | ICD-10-CM | POA: Diagnosis not present

## 2024-10-21 DIAGNOSIS — Z992 Dependence on renal dialysis: Secondary | ICD-10-CM | POA: Diagnosis not present

## 2024-10-21 DIAGNOSIS — N2581 Secondary hyperparathyroidism of renal origin: Secondary | ICD-10-CM | POA: Diagnosis not present

## 2024-10-21 DIAGNOSIS — R52 Pain, unspecified: Secondary | ICD-10-CM | POA: Diagnosis not present

## 2024-10-21 DIAGNOSIS — L299 Pruritus, unspecified: Secondary | ICD-10-CM | POA: Diagnosis not present

## 2024-10-24 DIAGNOSIS — L299 Pruritus, unspecified: Secondary | ICD-10-CM | POA: Diagnosis not present

## 2024-10-24 DIAGNOSIS — R52 Pain, unspecified: Secondary | ICD-10-CM | POA: Diagnosis not present

## 2024-10-24 DIAGNOSIS — N186 End stage renal disease: Secondary | ICD-10-CM | POA: Diagnosis not present

## 2024-10-24 DIAGNOSIS — Z992 Dependence on renal dialysis: Secondary | ICD-10-CM | POA: Diagnosis not present

## 2024-10-24 DIAGNOSIS — N2581 Secondary hyperparathyroidism of renal origin: Secondary | ICD-10-CM | POA: Diagnosis not present

## 2024-10-26 ENCOUNTER — Other Ambulatory Visit (HOSPITAL_COMMUNITY): Payer: Self-pay

## 2024-10-26 DIAGNOSIS — Z992 Dependence on renal dialysis: Secondary | ICD-10-CM | POA: Diagnosis not present

## 2024-10-26 DIAGNOSIS — R52 Pain, unspecified: Secondary | ICD-10-CM | POA: Diagnosis not present

## 2024-10-26 DIAGNOSIS — L299 Pruritus, unspecified: Secondary | ICD-10-CM | POA: Diagnosis not present

## 2024-10-26 DIAGNOSIS — N2581 Secondary hyperparathyroidism of renal origin: Secondary | ICD-10-CM | POA: Diagnosis not present

## 2024-10-26 DIAGNOSIS — N186 End stage renal disease: Secondary | ICD-10-CM | POA: Diagnosis not present

## 2024-10-29 DIAGNOSIS — N186 End stage renal disease: Secondary | ICD-10-CM | POA: Diagnosis not present

## 2024-10-29 DIAGNOSIS — N2581 Secondary hyperparathyroidism of renal origin: Secondary | ICD-10-CM | POA: Diagnosis not present

## 2024-10-29 DIAGNOSIS — Z992 Dependence on renal dialysis: Secondary | ICD-10-CM | POA: Diagnosis not present

## 2024-10-30 DIAGNOSIS — Z992 Dependence on renal dialysis: Secondary | ICD-10-CM | POA: Diagnosis not present

## 2024-10-30 DIAGNOSIS — N186 End stage renal disease: Secondary | ICD-10-CM | POA: Diagnosis not present

## 2024-10-30 DIAGNOSIS — I509 Heart failure, unspecified: Secondary | ICD-10-CM | POA: Diagnosis not present

## 2024-10-31 DIAGNOSIS — Z992 Dependence on renal dialysis: Secondary | ICD-10-CM | POA: Diagnosis not present

## 2024-10-31 DIAGNOSIS — R52 Pain, unspecified: Secondary | ICD-10-CM | POA: Diagnosis not present

## 2024-10-31 DIAGNOSIS — L299 Pruritus, unspecified: Secondary | ICD-10-CM | POA: Diagnosis not present

## 2024-10-31 DIAGNOSIS — N2581 Secondary hyperparathyroidism of renal origin: Secondary | ICD-10-CM | POA: Diagnosis not present

## 2024-10-31 DIAGNOSIS — N186 End stage renal disease: Secondary | ICD-10-CM | POA: Diagnosis not present

## 2024-11-01 ENCOUNTER — Ambulatory Visit: Admitting: Podiatry

## 2024-11-01 DIAGNOSIS — L600 Ingrowing nail: Secondary | ICD-10-CM | POA: Diagnosis not present

## 2024-11-01 MED ORDER — NEOMYCIN-POLYMYXIN-HC 3.5-10000-1 OT SUSP: OTIC | 0 refills | Status: AC

## 2024-11-01 NOTE — Progress Notes (Signed)
  Subjective:  Patient ID: Sean [name], male    DOB: 12-Nov-1971,  MRN: [id number]  Chief Complaint  Patient presents with   Ingrown Toenail    Right hallux pt stated that he feels like the nail is ingrown on the inside of his toe no blood or pus just discomfort with pressure.    53 y.o. male presents with the above complaint. History confirmed with patient.  Pedicures previously have dealt with this, recently it has gotten worse and they recommended he have it treated  Objective:  Physical Exam: warm, good capillary refill, no trophic changes or ulcerative lesions, normal DP and PT pulses, normal sensory exam, and ingrown right hallux lateral border no paronychia.  Assessment:   1. Ingrowing right great toenail      Plan:  Patient was evaluated and treated and all questions answered.    Ingrown Nail, right -Patient elects to proceed with minor surgery to remove ingrown toenail today. Consent reviewed and signed by patient. -Ingrown nail excised. See procedure note. -Educated on post-procedure care including soaking. Written instructions provided and reviewed. -Rx for Cortisporin sent to pharmacy. -Advised on signs and symptoms of infection developing.  We discussed that the phenol likely will create some redness and edema and tenderness around the nailbed as long as it is localized this is to be expected.  Will return as needed if any infection signs develop  Procedure: Excision of Ingrown Toenail Location: Right 1st toe lateral nail borders. Anesthesia: Lidocaine  1% plain; 1.5 mL and Marcaine 0.5% plain; 1.5 mL, digital block. Skin Prep: Betadine. Dressing: Silvadene; telfa; dry, sterile, compression dressing. Technique: Following skin prep, the toe was exsanguinated and a tourniquet was secured at the base of the toe. The affected nail border was freed, split with a nail splitter, and excised. Chemical matrixectomy was then performed with phenol and irrigated out with  alcohol. The tourniquet was then removed and sterile dressing applied. Disposition: Patient tolerated procedure well.    Return if symptoms worsen or fail to improve.

## 2024-11-01 NOTE — Patient Instructions (Signed)
 Soak Instructions    THE DAY AFTER THE PROCEDURE  Place 1/4 cup of epsom salts (or betadine, or white vinegar) in a quart of warm tap water.  Submerge your foot or feet with outer bandage intact for the initial soak; this will allow the bandage to become moist and wet for easy lift off.  Once you remove your bandage, continue to soak in the solution for 20 minutes.  This soak should be done twice a day.  Next, remove your foot or feet from solution, blot dry the affected area and apply 2-3 of the Cortisporin drops if they were prescribed for you.  If you did not receive a prescription use regular Neosporin or antibiotic ointment.  Then cover with a regular Band-Aid.  Do this for at least 2 weeks.  Longer if you are still having drainage redness or irritation  IF YOUR SKIN BECOMES IRRITATED WHILE USING THESE INSTRUCTIONS, IT IS OKAY TO SWITCH TO  WHITE VINEGAR AND WATER. Or you may use antibacterial soap and water to keep the toe clean  Monitor for any signs/symptoms of infection. Call the office immediately if any occur or go directly to the emergency room. Call with any questions/concerns.    Long Term Care Instructions-Post Nail Surgery  You have had your ingrown toenail and root treated with a chemical.  This chemical causes a burn that will drain and ooze like a blister.  This can drain for 6-8 weeks or longer.  It is important to keep this area clean, covered, and follow the soaking instructions dispensed at the time of your surgery.  This area will eventually dry and form a scab.  Once the scab forms you no longer need to soak or apply a dressing.  If at any time you experience an increase in pain, redness, swelling, or drainage, you should contact the office as soon as possible.

## 2024-11-02 ENCOUNTER — Encounter: Payer: Self-pay | Admitting: Licensed Clinical Social Worker

## 2024-11-02 ENCOUNTER — Telehealth: Payer: Self-pay | Admitting: Licensed Clinical Social Worker

## 2024-11-02 DIAGNOSIS — Z992 Dependence on renal dialysis: Secondary | ICD-10-CM | POA: Diagnosis not present

## 2024-11-02 DIAGNOSIS — N186 End stage renal disease: Secondary | ICD-10-CM | POA: Diagnosis not present

## 2024-11-02 DIAGNOSIS — L299 Pruritus, unspecified: Secondary | ICD-10-CM | POA: Diagnosis not present

## 2024-11-02 DIAGNOSIS — R52 Pain, unspecified: Secondary | ICD-10-CM | POA: Diagnosis not present

## 2024-11-02 DIAGNOSIS — N2581 Secondary hyperparathyroidism of renal origin: Secondary | ICD-10-CM | POA: Diagnosis not present

## 2024-11-04 DIAGNOSIS — Z992 Dependence on renal dialysis: Secondary | ICD-10-CM | POA: Diagnosis not present

## 2024-11-04 DIAGNOSIS — L299 Pruritus, unspecified: Secondary | ICD-10-CM | POA: Diagnosis not present

## 2024-11-04 DIAGNOSIS — N186 End stage renal disease: Secondary | ICD-10-CM | POA: Diagnosis not present

## 2024-11-04 DIAGNOSIS — N2581 Secondary hyperparathyroidism of renal origin: Secondary | ICD-10-CM | POA: Diagnosis not present

## 2024-11-04 DIAGNOSIS — R52 Pain, unspecified: Secondary | ICD-10-CM | POA: Diagnosis not present

## 2024-11-06 ENCOUNTER — Other Ambulatory Visit: Payer: Self-pay | Admitting: Internal Medicine

## 2024-11-07 DIAGNOSIS — L299 Pruritus, unspecified: Secondary | ICD-10-CM | POA: Diagnosis not present

## 2024-11-07 DIAGNOSIS — N186 End stage renal disease: Secondary | ICD-10-CM | POA: Diagnosis not present

## 2024-11-07 DIAGNOSIS — N2581 Secondary hyperparathyroidism of renal origin: Secondary | ICD-10-CM | POA: Diagnosis not present

## 2024-11-07 DIAGNOSIS — R52 Pain, unspecified: Secondary | ICD-10-CM | POA: Diagnosis not present

## 2024-11-07 DIAGNOSIS — Z992 Dependence on renal dialysis: Secondary | ICD-10-CM | POA: Diagnosis not present

## 2024-11-08 ENCOUNTER — Ambulatory Visit: Payer: Self-pay

## 2024-11-08 NOTE — Telephone Encounter (Signed)
 Please see call note and msg from pt requesting another round of antibiotics and advise; pt declined OV this morning

## 2024-11-08 NOTE — Telephone Encounter (Signed)
 FYI Only or Action Required?: Action required by provider: clinical question for provider.  Patient was last seen in primary care on 10/11/2024 by Jesus Bernardino MATSU, MD.  Called Nurse Triage reporting Cough and Nasal Congestion.  Symptoms began a week ago.  Interventions attempted: OTC medications: Benadryl.  Symptoms are: gradually worsening.  Triage Disposition: See Physician Within 24 Hours  Patient/caregiver understands and will follow disposition?: No, wishes to speak with PCP            Copied from CRM #[id number]. Topic: Clinical - Red Word Triage >> Nov 08, 2024  8:10 AM Antwanette L wrote: Red Word that prompted transfer to Nurse Triage: Patient is  reporting a runny/ stuffy nose and is coughing up a yellow, green, and clear mucus. The patient has had these symptoms for a week Reason for Disposition  SEVERE coughing spells (e.g., whooping sound after coughing, vomiting after coughing)  Answer Assessment - Initial Assessment Questions RN advised office visit. Patient declined and states he was just in a month ago and told he had a touch of pneumonia and treated with a Z pack. He would like another one called in without having to be seen and states he is following up with his PCP next week. Patient aware the office is on a delayed open this AM and will not be in til 10. Patient advised response will be delayed and to call back for worsening symptoms.   1. ONSET: When did the cough begin?      X 1 week or more.  2. SEVERITY: How bad is the cough today?      Hacking cough, he states he has episodes sometimes that are persistent. He states he has had the cough get severe enough it will cause him to vomit.  3. SPUTUM: Describe the color of your sputum (e.g., none, dry cough; clear, white, yellow, green)     Clear to green and yellow.  4. HEMOPTYSIS: Are you coughing up any blood? If Yes, ask: How much? (e.g., flecks, streaks, tablespoons, etc.)     No. He states  there is some bloody drainage from his nose.  5. DIFFICULTY BREATHING: Are you having difficulty breathing? If Yes, ask: How bad is it? (e.g., mild, moderate, severe)      Yes, when the cough turns hacking and persistent.  6. FEVER: Do you have a fever? If Yes, ask: What is your temperature, how was it measured, and when did it start?     No.  7. CARDIAC HISTORY: Do you have any history of heart disease? (e.g., heart attack, congestive heart failure)      A fib, CHF, a flutter.  8. LUNG HISTORY: Do you have any history of lung disease?  (e.g., pulmonary embolus, asthma, emphysema)     Asthma, OSA.  9. PE RISK FACTORS: Do you have a history of blood clots? (or: recent major surgery, recent prolonged travel, bedridden)     No.  10. OTHER SYMPTOMS: Do you have any other symptoms? (e.g., runny nose, wheezing, chest pain)       Runny nose with nasal congestion.  11. PREGNANCY: Is there any chance you are pregnant? When was your last menstrual period?       N/a.  12. TRAVEL: Have you traveled out of the country in the last month? (e.g., travel history, exposures)       No.  Protocols used: Cough - Acute Productive-A-AH

## 2024-11-09 DIAGNOSIS — Z992 Dependence on renal dialysis: Secondary | ICD-10-CM | POA: Diagnosis not present

## 2024-11-09 DIAGNOSIS — R52 Pain, unspecified: Secondary | ICD-10-CM | POA: Diagnosis not present

## 2024-11-09 DIAGNOSIS — N186 End stage renal disease: Secondary | ICD-10-CM | POA: Diagnosis not present

## 2024-11-09 DIAGNOSIS — L299 Pruritus, unspecified: Secondary | ICD-10-CM | POA: Diagnosis not present

## 2024-11-09 DIAGNOSIS — N2581 Secondary hyperparathyroidism of renal origin: Secondary | ICD-10-CM | POA: Diagnosis not present

## 2024-11-11 DIAGNOSIS — L299 Pruritus, unspecified: Secondary | ICD-10-CM | POA: Diagnosis not present

## 2024-11-11 DIAGNOSIS — N2581 Secondary hyperparathyroidism of renal origin: Secondary | ICD-10-CM | POA: Diagnosis not present

## 2024-11-11 DIAGNOSIS — N186 End stage renal disease: Secondary | ICD-10-CM | POA: Diagnosis not present

## 2024-11-11 DIAGNOSIS — Z992 Dependence on renal dialysis: Secondary | ICD-10-CM | POA: Diagnosis not present

## 2024-11-11 DIAGNOSIS — R52 Pain, unspecified: Secondary | ICD-10-CM | POA: Diagnosis not present

## 2024-11-15 ENCOUNTER — Encounter: Payer: Self-pay | Admitting: Internal Medicine

## 2024-11-15 ENCOUNTER — Ambulatory Visit (INDEPENDENT_AMBULATORY_CARE_PROVIDER_SITE_OTHER): Admitting: Internal Medicine

## 2024-11-15 VITALS — BP 138/68 | HR 66 | Temp 98.0°F | Ht 71.0 in | Wt 358.8 lb

## 2024-11-15 DIAGNOSIS — R7303 Prediabetes: Secondary | ICD-10-CM

## 2024-11-15 DIAGNOSIS — L308 Other specified dermatitis: Secondary | ICD-10-CM | POA: Diagnosis not present

## 2024-11-15 DIAGNOSIS — K7460 Unspecified cirrhosis of liver: Secondary | ICD-10-CM

## 2024-11-15 DIAGNOSIS — K219 Gastro-esophageal reflux disease without esophagitis: Secondary | ICD-10-CM

## 2024-11-15 DIAGNOSIS — N186 End stage renal disease: Secondary | ICD-10-CM

## 2024-11-15 DIAGNOSIS — R350 Frequency of micturition: Secondary | ICD-10-CM

## 2024-11-15 DIAGNOSIS — G4733 Obstructive sleep apnea (adult) (pediatric): Secondary | ICD-10-CM

## 2024-11-15 DIAGNOSIS — K279 Peptic ulcer, site unspecified, unspecified as acute or chronic, without hemorrhage or perforation: Secondary | ICD-10-CM

## 2024-11-15 DIAGNOSIS — Z951 Presence of aortocoronary bypass graft: Secondary | ICD-10-CM

## 2024-11-15 DIAGNOSIS — R300 Dysuria: Secondary | ICD-10-CM | POA: Diagnosis not present

## 2024-11-15 DIAGNOSIS — N3001 Acute cystitis with hematuria: Secondary | ICD-10-CM | POA: Diagnosis not present

## 2024-11-15 DIAGNOSIS — D696 Thrombocytopenia, unspecified: Secondary | ICD-10-CM

## 2024-11-15 DIAGNOSIS — R1115 Cyclical vomiting syndrome unrelated to migraine: Secondary | ICD-10-CM

## 2024-11-15 DIAGNOSIS — K2100 Gastro-esophageal reflux disease with esophagitis, without bleeding: Secondary | ICD-10-CM | POA: Diagnosis not present

## 2024-11-15 DIAGNOSIS — R1084 Generalized abdominal pain: Secondary | ICD-10-CM

## 2024-11-15 DIAGNOSIS — K449 Diaphragmatic hernia without obstruction or gangrene: Secondary | ICD-10-CM

## 2024-11-15 DIAGNOSIS — Z992 Dependence on renal dialysis: Secondary | ICD-10-CM

## 2024-11-15 DIAGNOSIS — K7581 Nonalcoholic steatohepatitis (NASH): Secondary | ICD-10-CM

## 2024-11-15 DIAGNOSIS — I5032 Chronic diastolic (congestive) heart failure: Secondary | ICD-10-CM

## 2024-11-15 LAB — POCT URINE DIPSTICK
Bilirubin, UA: NEGATIVE
Glucose, UA: NEGATIVE mg/dL
Ketones, POC UA: NEGATIVE mg/dL
Nitrite, UA: NEGATIVE
POC PROTEIN,UA: 30 — AB
Spec Grav, UA: 1.025 (ref 1.010–1.025)
Urobilinogen, UA: 0.2 U/dL
pH, UA: 6 (ref 5.0–8.0)

## 2024-11-15 MED ORDER — TRIAMCINOLONE ACETONIDE 0.1 % EX CREA: 1.0000 | TOPICAL_CREAM | Freq: Two times a day (BID) | CUTANEOUS | 0 refills | Status: AC

## 2024-11-15 MED ORDER — RABEPRAZOLE SODIUM 20 MG PO TBEC: 20.0000 mg | DELAYED_RELEASE_TABLET | Freq: Every day | ORAL | 4 refills | Status: AC

## 2024-11-15 MED ORDER — TIRZEPATIDE-WEIGHT MANAGEMENT 2.5 MG/0.5ML ~~LOC~~ SOAJ: 2.5000 mg | SUBCUTANEOUS | 11 refills | Status: AC

## 2024-11-15 MED ORDER — CIPROFLOXACIN HCL 500 MG PO TABS: 500.0000 mg | ORAL_TABLET | Freq: Every day | ORAL | 0 refills | Status: AC

## 2024-11-15 NOTE — Assessment & Plan Note (Signed)
 Hiatal hernia with GERD and esophagitis   He experiences persistent GERD symptoms, including vomiting and abdominal pain, possibly exacerbated by a hiatal hernia. Previous trials of Protonix  and Prilosec were ineffective. Prescribed rabeprazole  (Aciphex ) as a replacement for Dexilant due to cost concerns. Referred to bariatric surgery for potential hiatal hernia repair.

## 2024-11-15 NOTE — Assessment & Plan Note (Signed)
 End stage renal disease on dialysis   He manages this chronic condition with dialysis, facing challenges in fluid management due to intake restrictions. Symptoms of cramping post-dialysis may relate to fluid removal. Encouraged increased fluid intake to manage urinary symptoms, despite dialysis restrictions. Consulted nephrology regarding post-dialysis cramps and fluid management.

## 2024-11-15 NOTE — Patient Instructions (Addendum)
 It was a pleasure seeing you today! Your health and satisfaction are our top priorities.  Bernardino Cone, MD  VISIT SUMMARY: Today, you were seen for a persistent cough and urinary tract symptoms, including a burning sensation during urination. We also discussed your ongoing issues with heartburn, weight loss, dialysis-related cramps, sleep apnea, and skin dryness and itching.  YOUR PLAN: -ACUTE CYSTITIS WITH HEMATURIA, POSSIBLE NEPHROLITHIASIS: You have a urinary tract infection (UTI) with blood in your urine, and possibly kidney stones. We have ordered a urinalysis and urine culture to check for infection and stones. You are prescribed Cipro  500 mg to take every 24 hours for 7-10 days after dialysis. Please increase your fluid intake to help flush out any potential stones. A CT scan of your abdomen and pelvis has also been ordered to evaluate for stones.  -HIATAL HERNIA WITH GERD AND ESOPHAGITIS: You have a hiatal hernia causing severe heartburn and inflammation of the esophagus. We have prescribed rabeprazole  (Aciphex ) to help manage your symptoms. You are also referred to bariatric surgery for a potential hiatal hernia repair.  -MORBID OBESITY: You are dealing with significant weight issues. We have resent your referral to bariatric surgery and submitted an appeal for the weight loss medication Zepbound  with additional codes to support the referral.  -END STAGE RENAL DISEASE ON DIALYSIS: You have chronic kidney disease requiring dialysis. We discussed the importance of managing your fluid intake to help with your urinary symptoms and consulted nephrology regarding your post-dialysis cramps.  -OBSTRUCTIVE SLEEP APNEA ON CPAP: You have sleep apnea, which is being managed with a CPAP machine. If needed, we may arrange for an updated sleep study depending on your insurance requirements.  -NON-INTRACTABLE CYCLICAL VOMITING WITH NAUSEA: You experience intermittent vomiting and nausea, likely related to  your hiatal hernia and GERD. We will continue to monitor your symptoms and adjust your treatment as needed.  -OTHER ECZEMA: You have skin dryness and itching. We have prescribed a steroid cream to help manage these symptoms and advised you on proper nail care to prevent skin damage from scratching.  INSTRUCTIONS: Please follow up with the urinalysis and urine culture as soon as possible. Take the prescribed Cipro  500 mg every 24 hours for 7-10 days after dialysis. Increase your fluid intake to help flush out potential kidney stones. Schedule the CT scan of your abdomen and pelvis. Follow up with bariatric surgery for a potential hiatal hernia repair. Continue using your CPAP machine and let us  know if you need an updated sleep study. Use the prescribed steroid cream for your eczema and follow the nail care advice to prevent skin damage.  Your Providers PCP: Cone Bernardino MATSU, MD,  [phone]) Referring Provider: Cone Bernardino MATSU, MD,  [phone]) Care Team Provider: Santo Stanly LABOR, MD,  [phone]) Care Team Provider: Alvia Olam BIRCH, RN Care Team Provider: Ezzard Valaria LABOR, MD,  [phone]) Care Team Provider: Tobie Gordy POUR, MD,  [phone]) Care Team Provider: [facility],  [phone]) Care Team Provider: May, Deanna J, NP,  [phone]) Care Team Provider: Merlynn Lyle CROME, LCSW  NEXT STEPS: [x]  Early Intervention: Schedule sooner appointment, call our on-call services, or go to emergency room if there is any significant Increase in pain or discomfort New or worsening symptoms Sudden or severe changes in your health [x]  Flexible Follow-Up: We recommend a Return in about 1 month (around 12/16/2024). for optimal routine care. This allows for progress monitoring and treatment adjustments. [x]  Preventive Care: Schedule your annual preventive care visit! It's  typically covered by insurance and helps identify potential health issues early. [x]  Lab &  X-ray Appointments: Incomplete tests scheduled today, or call to schedule. X-rays: [facility] (M-F, 8:30am-noon or 1pm-5pm). [x]  Medical Information Release: Sign a release form at front desk to obtain relevant medical information we don't have.  MAKING THE MOST OF OUR FOCUSED 20 MINUTE APPOINTMENTS: [x]   Clearly state your top concerns at the beginning of the visit to focus our discussion [x]   If you anticipate you will need more time, please inform the front desk during scheduling - we can book multiple appointments in the same week. [x]   If you have transportation problems- use our convenient video appointments or ask about transportation support. [x]   We can get down to business faster if you use MyChart to update information before the visit and submit non-urgent questions before your visit. Thank you for taking the time to provide details through MyChart.  Let our nurse know and she can import this information into your encounter documents.  Arrival and Wait Times: [x]   Arriving on time ensures that everyone receives prompt attention. [x]   Early morning (8a) and afternoon (1p) appointments tend to have shortest wait times. [x]   Unfortunately, we cannot delay appointments for late arrivals or hold slots during phone calls.  Getting Answers and Following Up [x]   Simple Questions & Concerns: For quick questions or basic follow-up after your visit, reach us  at [phone] or MyChart messaging. [x]   Complex Concerns: If your concern is more complex, scheduling an appointment might be best. Discuss this with the staff to find the most suitable option. [x]   Lab & Imaging Results: We'll contact you directly if results are abnormal or you don't use MyChart. Most normal results will be on MyChart within 2-3 business days, with a review message from Dr. Jesus. Haven't heard back in 2 weeks? Need results sooner? Contact us  at [phone]. [x]   Referrals: Our referral  coordinator will manage specialist referrals. The specialist's office should contact you within 2 weeks to schedule an appointment. Call us  if you haven't heard from them after 2 weeks.  Staying Connected [x]   MyChart: Activate your MyChart for the fastest way to access results and message us . See the last page of this paperwork for instructions on how to activate.  Bring to Your Next Appointment [x]   Medications: Please bring all your medication bottles to your next appointment to ensure we have an accurate record of your prescriptions. [x]   Health Diaries: If you're monitoring any health conditions at home, keeping a diary of your readings can be very helpful for discussions at your next appointment.  Billing [x]   X-ray & Lab Orders: These are billed by separate companies. Contact the invoicing company directly for questions or concerns. [x]   Visit Charges: Discuss any billing inquiries with our administrative services team.  Your Satisfaction Matters [x]   Share Your Experience: We strive for your satisfaction! If you have any complaints, or preferably compliments, please let Dr. Jesus know directly or contact our Practice Administrators, Manuelita Rubin or [employer], by asking at the front desk.   Reviewing Your Records [x]   Review this early draft of your clinical encounter notes below and the final encounter summary tomorrow on MyChart after its been completed.  All orders placed so far are visible here: Acute cystitis with hematuria Assessment & Plan: Acute cystitis with hematuria, possible nephrolithiasis   He presents with urinary tract symptoms, including a burning sensation during urination  and hematuria, suggesting a UTI with possible nephrolithiasis due to stasis and stone formation. Previous Z-Pak use was not ideal for UTI but helped with a cough. Ordered urinalysis and urine culture to assess for infection and stones. Prescribed Cipro  500 mg every 24 hours for 7-10 days,  administered after dialysis. Encouraged increased fluid intake to flush out potential stones. Ordered a CT scan of the abdomen and pelvis to evaluate for stones.  Orders: -     Urinalysis w microscopic + reflex cultur -     CT ABDOMEN PELVIS WO CONTRAST; Future -     Ciprofloxacin  HCl; Take 1 tablet (500 mg total) by mouth daily at 6 (six) AM for 10 days. 500 mg every 24 hours, administered after dialysis for complicated uti  Dispense: 10 tablet; Refill: 0  Dysuria  Frequency of urination -     POCT URINE DIPSTICK -     Urinalysis w microscopic + reflex cultur  Prediabetes Assessment & Plan: Lab Results  Component Value Date   HGBA1C 4.8 09/08/2024   HGBA1C 6.1 03/14/2024   HGBA1C 5.6 06/17/2023   HGBA1C 5.2 09/30/2022   HGBA1C 5.6 12/23/2015  Despite Body mass index is 50.04 kg/m. He has no significant insulin  resistance.  Orders: -     Tirzepatide -Weight Management; Inject 2.5 mg into the skin once a week.  Dispense: 2 mL; Refill: 11  ESRD (end stage renal disease) (HCC) Assessment & Plan: End stage renal disease on dialysis   He manages this chronic condition with dialysis, facing challenges in fluid management due to intake restrictions. Symptoms of cramping post-dialysis may relate to fluid removal. Encouraged increased fluid intake to manage urinary symptoms, despite dialysis restrictions. Consulted nephrology regarding post-dialysis cramps and fluid management.  Orders: -     Tirzepatide -Weight Management; Inject 2.5 mg into the skin once a week.  Dispense: 2 mL; Refill: 11  Decompensated cirrhosis (HCC) Assessment & Plan: Compliance with fluid management recommendations and hemodialysis managed by nephrology Nephrology wants patient on Zepbound  as well so will continue to seek prior authorization approval.   Metabolic dysfunction-associated steatohepatitis (MASH) Assessment & Plan: Compliance with fluid management recommendations and hemodialysis managed by  nephrology Nephrology wants patient on Zepbound  as well so will continue to seek prior authorization approval.  Orders: -     Tirzepatide -Weight Management; Inject 2.5 mg into the skin once a week.  Dispense: 2 mL; Refill: 11  Chronic diastolic CHF (congestive heart failure) (HCC) Assessment & Plan: Compliance with fluid management recommendations and hemodialysis managed by nephrology Nephrology wants patient on Zepbound  as well so will continue to seek prior authorization approval.  Orders: -     Tirzepatide -Weight Management; Inject 2.5 mg into the skin once a week.  Dispense: 2 mL; Refill: 11  Dependence on renal dialysis Assessment & Plan: Compliance with fluid management recommendations and hemodialysis managed by nephrology Nephrology wants patient on Zepbound  as well so will continue to seek prior authorization approval.  Orders: -     Tirzepatide -Weight Management; Inject 2.5 mg into the skin once a week.  Dispense: 2 mL; Refill: 11  Obstructive sleep apnea syndrome Assessment & Plan: Patient reports   Orders: -     Amb Referral to Bariatric Surgery -     Tirzepatide -Weight Management; Inject 2.5 mg into the skin once a week.  Dispense: 2 mL; Refill: 11  PUD (peptic ulcer disease) Assessment & Plan: Hiatal hernia with GERD and esophagitis   He experiences persistent GERD symptoms, including vomiting and  abdominal pain, possibly exacerbated by a hiatal hernia. Previous trials of Protonix  and Prilosec were ineffective. Prescribed rabeprazole  (Aciphex ) as a replacement for Dexilant due to cost concerns. Referred to bariatric surgery for potential hiatal hernia repair.  Orders: -     Amb Referral to Bariatric Surgery -     Tirzepatide -Weight Management; Inject 2.5 mg into the skin once a week.  Dispense: 2 mL; Refill: 11  Gastroesophageal reflux disease without esophagitis Assessment & Plan: Hiatal hernia with GERD and esophagitis   He experiences persistent GERD  symptoms, including vomiting and abdominal pain, possibly exacerbated by a hiatal hernia. Previous trials of Protonix  and Prilosec were ineffective. Prescribed rabeprazole  (Aciphex ) as a replacement for Dexilant due to cost concerns. Referred to bariatric surgery for potential hiatal hernia repair.  Orders: -     Amb Referral to Bariatric Surgery -     Tirzepatide -Weight Management; Inject 2.5 mg into the skin once a week.  Dispense: 2 mL; Refill: 11  Generalized abdominal pain -     CT ABDOMEN PELVIS WO CONTRAST; Future  Hiatal hernia with GERD and esophagitis Assessment & Plan: Hiatal hernia with GERD and esophagitis   He experiences persistent GERD symptoms, including vomiting and abdominal pain, possibly exacerbated by a hiatal hernia. Previous trials of Protonix  and Prilosec were ineffective. Prescribed rabeprazole  (Aciphex ) as a replacement for Dexilant due to cost concerns. Referred to bariatric surgery for potential hiatal hernia repair.  Orders: -     Amb Referral to Bariatric Surgery -     Tirzepatide -Weight Management; Inject 2.5 mg into the skin once a week.  Dispense: 2 mL; Refill: 11 -     CT ABDOMEN PELVIS WO CONTRAST; Future  Morbid obesity (HCC) -     Amb Referral to Bariatric Surgery -     Tirzepatide -Weight Management; Inject 2.5 mg into the skin once a week.  Dispense: 2 mL; Refill: 11  OSA on CPAP -     Tirzepatide -Weight Management; Inject 2.5 mg into the skin once a week.  Dispense: 2 mL; Refill: 11  Other eczema -     Triamcinolone  Acetonide; Apply 1 Application topically 2 (two) times daily.  Dispense: 30 g; Refill: 0  Non-intractable cyclical vomiting with nausea  Other orders -     RABEprazole  Sodium; Take 1 tablet (20 mg total) by mouth daily.  Dispense: 90 tablet; Refill: 4 -     Urine Culture -     REFLEXIVE URINE CULTURE

## 2024-11-15 NOTE — Assessment & Plan Note (Signed)
 Acute cystitis with hematuria, possible nephrolithiasis   He presents with urinary tract symptoms, including a burning sensation during urination and hematuria, suggesting a UTI with possible nephrolithiasis due to stasis and stone formation. Previous Z-Pak use was not ideal for UTI but helped with a cough. Ordered urinalysis and urine culture to assess for infection and stones. Prescribed Cipro  500 mg every 24 hours for 7-10 days, administered after dialysis. Encouraged increased fluid intake to flush out potential stones. Ordered a CT scan of the abdomen and pelvis to evaluate for stones.

## 2024-11-15 NOTE — Progress Notes (Unsigned)
 ==============================  [facility]: [phone]   -- Medical Office Visit --  Patient: Sean [name]      Age: 53 y.o.       Sex:  male  Date:   11/15/2024 Today's Healthcare Provider: Bernardino KANDICE Cone, MD  ==============================   Chief Complaint: Obesity, Dysmenorrhea (Cramping a lot ), and Urinary Frequency (Pt is burning when going to the bathroom as well) Discussed the use of AI scribe software for clinical note transcription with the patient, who gave verbal consent to proceed. History of Present Illness 53 year old male who presents with cough and urinary tract symptoms.  He has been experiencing a persistent cough and urinary tract symptoms, specifically a burning sensation during urination. He recalls a similar issue in the past that was treated with a Z-Pak, which provided relief.  He has a history of hiatal hernia and experiences significant heartburn. Despite trying various medications such as Protonix , Prilosec, and Nexium, he has not found relief. He experiences nausea and vomiting, particularly at night.  He is on dialysis and experiences severe cramps post-dialysis, for which he takes a muscle relaxant.  He has a history of sleep apnea and uses a CPAP machine since 2017, following a sleep study.  He has been experiencing weight loss, reporting a loss of about 20 pounds. He is dealing with insurance issues regarding coverage for weight loss medication. Background Reviewed: Problem List: has History of umbilical hernia repair; Hypertension; Hyperlipidemia; Thrombocytopenia; Sinus bradycardia; Atherosclerotic heart disease of native coronary artery without angina pectoris; Atrial flutter with rapid ventricular response (HCC); Coronary artery disease involving native coronary artery of native heart without angina pectoris; Other long term (current) drug therapy; History of coronary artery bypass surgery; Obstructive  sleep apnea syndrome; Chronic gout of multiple sites; Alkaline phosphatase elevation; Morbid obesity with BMI of 50.0-59.9, adult (HCC); Gout; Ectatic aorta; Gastroesophageal reflux disease without esophagitis; Herpes simplex; Metabolic dysfunction-associated steatohepatitis (MASH); Prediabetes; Statins contraindicated; Hypothyroid; Chronic venous stasis dermatitis of both lower extremities; Chronic nausea; Chronic liver failure without hepatic coma (HCC); Splenomegaly; Spondylolisthesis; Bleeding diathesis; Malaise and fatigue; Allergic rhinitis due to pollen; Amitriptyline  adverse reaction; MGUS (monoclonal gammopathy of unknown significance); 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; Rectal bleeding; Recurrent sinusitis; Asthma, chronic; ESRD (end stage renal disease) (HCC); Atrial fibrillation (HCC); PUD (peptic ulcer disease); Disability affecting daily living; Inflamed internal hemorrhoid; Anemia in chronic kidney disease; Chronic diastolic CHF (congestive heart failure) (HCC); Coagulation defect, unspecified; Dependence on renal dialysis; Gout due to renal impairment, unspecified site; Hypertensive chronic kidney disease with stage 5 chronic kidney disease or end stage renal disease (HCC); Mild protein-calorie malnutrition; Other chronic pain; Other disorders of phosphorus metabolism; Other fluid overload; Iron deficiency anemia, unspecified; Other iron deficiency anemias; Pruritus, unspecified; Pure hypercholesterolemia, unspecified; Secondary hyperparathyroidism of renal origin; Shortness of breath; Abnormal bowel habits; Inadequate material resources; Decompensated cirrhosis (HCC); Acute hepatic encephalopathy (HCC); UTI (urinary tract infection); Consult; Hiatal hernia with GERD and esophagitis; Allergy, unspecified, initial encounter; Anaphylactic shock, unspecified, initial encounter; and Encounter for fitting and adjustment of extracorporeal dialysis catheter on their problem list. Past Medical  History:  has a past medical history of Abnormal liver enzymes, Acute renal failure superimposed on stage 5 chronic kidney disease, not on chronic dialysis (HCC) (01/08/2024), AKI (acute kidney injury) (06/17/2023), Atrial fibrillation (HCC), Cardiac cirrhosis, Chronic combined systolic and diastolic CHF (congestive heart failure) (HCC), Chronic kidney disease, stage 4 (severe) (HCC) (01/04/2016), CKD (chronic kidney disease) stage 3,  GFR 30-59 ml/min (HCC) (01/04/2016), CKD (chronic kidney disease), stage II, Congenital heart defect, Coronary artery disease, Dyspnea, Gastric polyps (11/25/2022), Gastritis and gastroduodenitis (11/25/2022), Gastroesophageal reflux disease with esophagitis and hemorrhage (04/04/2021), Hematuria, History of umbilical hernia repair (07/10/2011), Hypercholesteremia, Hypertension, Incisional hernia (07/10/2011), Morbid obesity (HCC), Morbid obesity with BMI of 50.0-59.9, adult (HCC) (01/04/2016), Nausea and vomiting (11/25/2022), OSA on CPAP, Paroxysmal atrial flutter (HCC) (02/12/2016), PONV (postoperative nausea and vomiting), 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 Past Surgical History:   has a past surgical history that includes Gallbladder surgery; Appendectomy; Hernia repair; Cardiac catheterization (N/A, 12/24/2015); Coronary artery bypass graft (N/A, 12/26/2015); TEE without cardioversion (N/A, 12/26/2015); Cardioversion (N/A, 02/14/2016); TEE without cardioversion (N/A, 02/14/2016); LEFT HEART CATH AND CORS/GRAFTS ANGIOGRAPHY (N/A, 04/15/2017); LEFT HEART CATH AND CORS/GRAFTS ANGIOGRAPHY (N/A, 11/02/2020); Colonoscopy with propofol  (N/A, 02/19/2021); Esophagogastroduodenoscopy (egd) with propofol  (N/A, 02/19/2021); biopsy (02/19/2021); polypectomy (02/19/2021); Esophagogastroduodenoscopy (egd) with propofol  (N/A, 11/25/2022); biopsy (11/25/2022); IR Transcatheter BX (06/26/2023); IR US  Guide Vasc Access Right (06/26/2023); IR Venogram Hepatic W  Hemodynamic Evaluation (06/26/2023); AV fistula placement (Left, 01/11/2024); Insertion of dialysis catheter (Right, 01/11/2024); Fistula superficialization (Left, 05/06/2024); and Ligation of competing branches of arteriovenous fistula (Left, 05/06/2024). Social History:   reports that he has never smoked. He has never used smokeless tobacco. He reports that he does not currently use alcohol. He reports that he does not use drugs. Family History:  family history includes CAD in his father; Diabetes in his father, maternal grandfather, and maternal uncle; Heart attack in an other family member; Hyperlipidemia in an other family member. Allergies:  is allergic to glucophage  [metformin ], vibra -tab [doxycycline ], chlorhexidine , chlorhexidine  gluconate, augmentin [amoxicillin-pot clavulanate], imdur  [isosorbide  dinitrate], tape, and valacyclovir .   Medication Reconciliation: Current Outpatient Medications on File Prior to Visit  Medication Sig   albuterol  (VENTOLIN  HFA) 108 (90 Base) MCG/ACT inhaler Inhale 2 puffs into the lungs every 4 (four) hours as needed.   allopurinol  (ZYLOPRIM ) 100 MG tablet TAKE 1 TABLET BY MOUTH EVERY DAY   amitriptyline  (ELAVIL ) 25 MG tablet TAKE 1 TABLET BY MOUTH EVERYDAY AT BEDTIME   azelastine  (ASTELIN ) 0.1 % nasal spray Place 2 sprays into both nostrils 2 (two) times daily. Use in each nostril as directed   B Complex-C-Folic Acid  (DIALYVITE  TABLET) TABS Take 1 tablet by mouth daily.   Cyanocobalamin  (VITAMIN B-12) 5000 MCG SUBL Place 5,000 mcg under the tongue daily.   diltiazem  (CARDIZEM  SR) 120 MG 12 hr capsule Take 1 capsule (120 mg total) by mouth 2 (two) times daily.   ELIQUIS  5 MG TABS tablet TAKE 1 TABLET BY MOUTH TWICE A DAY   Evolocumab  (REPATHA ) 140 MG/ML SOSY Inject 140 mg into the skin every 14 (fourteen) days.   folic acid -vitamin b complex-vitamin c-selenium-zinc (DIALYVITE ) 3 MG TABS tablet Take 1 tablet by mouth daily.   hydrocortisone  (ANUSOL -HC) 25 MG  suppository Place 1 suppository (25 mg total) rectally 2 (two) times daily.   lactulose  (CHRONULAC ) 10 GM/15ML solution TAKE 45 MLS 4 TIMES DAILY AS NEEDED (TO ACHEIVE GOAL OF 2-3 BMS DAILY). TITRATE BASED ON THE STOOL FREQUENCY.   lactulose , encephalopathy, (CHRONULAC ) 10 GM/15ML SOLN Take 30 mLs by mouth in the morning and at bedtime.   lanthanum  (FOSRENOL ) 1000 MG chewable tablet Chew 1,000 mg by mouth 3 (three) times daily with meals.   losartan  (COZAAR ) 50 MG tablet Take 1 tablet (50 mg total) by mouth daily. (Patient not taking: Reported on 09/08/2024)   Methoxy PEG-Epoetin Beta (MIRCERA IJ) 75 mcg.  montelukast  (SINGULAIR ) 10 MG tablet TAKE 1 TABLET BY MOUTH EVERY DAY   mupirocin  ointment (BACTROBAN ) 2 % Apply to inner nares twice daily   neomycin -polymyxin-hydrocortisone  (CORTISPORIN) 3.5-10000-1 OTIC suspension Apply 1-2 drops daily after soaking and cover with bandaid   nitroGLYCERIN  (NITROSTAT ) 0.4 MG SL tablet Place 1 tablet (0.4 mg total) under the tongue every 5 (five) minutes as needed for chest pain.   ondansetron  (ZOFRAN -ODT) 4 MG disintegrating tablet Take 1 tablet (4 mg total) by mouth every 8 (eight) hours as needed for nausea or vomiting.   oxyCODONE -acetaminophen  (PERCOCET) 5-325 MG tablet Take 1 tablet by mouth every 6 (six) hours as needed for severe pain (pain score 7-10).   pantoprazole  (PROTONIX ) 40 MG tablet Take 2 tablets (80 mg total) by mouth daily.   polyethylene glycol (MIRALAX  / GLYCOLAX ) 17 g packet Take 17 g by mouth daily as needed. Titrate based on the stool frequency. Goal :2-3 BMs a day,   promethazine  (PHENERGAN ) 25 MG tablet Take 1 tablet (25 mg total) by mouth every 8 (eight) hours as needed for nausea or vomiting.   sucralfate  (CARAFATE ) 1 g tablet Take 1 g by mouth at bedtime.   tirzepatide  (ZEPBOUND ) 2.5 MG/0.5ML Pen Inject 2.5 mg into the skin once a week.   torsemide  (DEMADEX ) 10 MG tablet Take 10 mg by mouth daily. (Patient taking differently: Take 10  mg by mouth daily. Taking prn)   Vonoprazan Fumarate  (VOQUEZNA ) 20 MG TABS Take by mouth.   Current Facility-Administered Medications on File Prior to Visit  Medication   technetium tetrofosmin  (TC-MYOVIEW ) injection 29.8 millicurie   Medications Discontinued During This Encounter  Medication Reason   tirzepatide  (ZEPBOUND ) 2.5 MG/0.5ML Pen Reorder   rifaximin  (XIFAXAN ) 550 MG TABS tablet    sulfamethoxazole -trimethoprim  (BACTRIM  DS) 800-160 MG tablet      Physical Exam:    11/15/2024   11:16 AM 10/11/2024   11:05 AM 09/08/2024   10:55 AM  Vitals with BMI  Height 5' 11 5' 11 5' 11  Weight 358 lbs 13 oz 366 lbs 3 oz 359 lbs 6 oz  BMI 50.06 51.1 50.15  Systolic 138 136 869  Diastolic 68 70 66  Pulse 66 75 79  Vital signs reviewed.  Nursing notes reviewed. Weight trend reviewed. Physical Activity: Not on file   General Appearance:  No acute distress appreciable.   Well-groomed, healthy-appearing male.  Well proportioned with no abnormal fat distribution.  Good muscle tone. Pulmonary:  Normal work of breathing at rest, no respiratory distress apparent. SpO2: 98 %  Musculoskeletal: All extremities are intact.  Neurological:  Awake, alert, oriented, and engaged.  No obvious focal neurological deficits or cognitive impairments.  Sensorium seems unclouded.   Speech is clear and coherent with logical content. Psychiatric:  Appropriate mood, pleasant and cooperative demeanor, thoughtful and engaged during the exam      10/05/2024   11:02 AM 09/01/2024   11:32 AM 08/08/2024   10:59 AM 07/26/2024   10:00 AM  PHQ 2/9 Scores  PHQ - 2 Score 0 0 0 0    Results for orders placed or performed in visit on 11/15/24  Urinalysis w microscopic + reflex cultur   Specimen: Urine  Result Value Ref Range   Color, Urine DARK YELLOW YELLOW   APPearance TURBID (A) CLEAR   Specific Gravity, Urine 1.018 1.001 - 1.035   pH 5.5 5.0 - 8.0   Glucose, UA NEGATIVE NEGATIVE   Bilirubin Urine NEGATIVE  NEGATIVE   Ketones,  ur TRACE (A) NEGATIVE   Hgb urine dipstick 2+ (A) NEGATIVE   Protein, ur 3+ (A) NEGATIVE   Nitrites, Initial NEGATIVE NEGATIVE   Leukocyte Esterase 2+ (A) NEGATIVE   WBC, UA > OR = 60 (A) 0 - 5 /HPF   RBC / HPF 0-2 0 - 2 /HPF   Squamous Epithelial / HPF 0-5 < OR = 5 /HPF   Bacteria, UA MANY (A) NONE SEEN /HPF   Hyaline Cast 0-5 (A) NONE SEEN /LPF   Note    REFLEXIVE URINE CULTURE  Result Value Ref Range   REFLEXIVE URINE CULTURE    POCT URINE DIPSTICK  Result Value Ref Range   Color, UA brown (A) yellow   Clarity, UA hazy (A) clear   Glucose, UA negative negative mg/dL   Bilirubin, UA negative negative   Ketones, POC UA negative negative mg/dL   Spec Grav, UA 8.974 8.989 - 1.025   Blood, UA trace-lysed (A) negative   pH, UA 6.0 5.0 - 8.0   POC PROTEIN,UA =30 (A) negative, trace   Urobilinogen, UA 0.2 0.2 or 1.0 E.U./dL   Nitrite, UA Negative Negative   Leukocytes, UA Small (1+) (A) Negative  } Office Visit on 11/15/2024  Component Date Value Ref Range Status   Color, UA 11/15/2024 brown (A)  yellow Final   Clarity, UA 11/15/2024 hazy (A)  clear Final   Glucose, UA 11/15/2024 negative  negative mg/dL Final   Bilirubin, UA 87/83/7974 negative  negative Final   Ketones, POC UA 11/15/2024 negative  negative mg/dL Final   Spec Grav, UA 87/83/7974 1.025  1.010 - 1.025 Final   Blood, UA 11/15/2024 trace-lysed (A)  negative Final   pH, UA 11/15/2024 6.0  5.0 - 8.0 Final   POC PROTEIN,UA 11/15/2024 =30 (A)  negative, trace Final   Urobilinogen, UA 11/15/2024 0.2  0.2 or 1.0 E.U./dL Final   Nitrite, UA 87/83/7974 Negative  Negative Final   Leukocytes, UA 11/15/2024 Small (1+) (A)  Negative Final   Color, Urine 11/15/2024 DARK YELLOW  YELLOW Final   APPearance 11/15/2024 TURBID (A)  CLEAR Final   Specific Gravity, Urine 11/15/2024 1.018  1.001 - 1.035 Final   pH 11/15/2024 5.5  5.0 - 8.0 Final   Glucose, UA 11/15/2024 NEGATIVE  NEGATIVE Final   Bilirubin  Urine 11/15/2024 NEGATIVE  NEGATIVE Final   Ketones, ur 11/15/2024 TRACE (A)  NEGATIVE Final   Hgb urine dipstick 11/15/2024 2+ (A)  NEGATIVE Final   Protein, ur 11/15/2024 3+ (A)  NEGATIVE Final   Nitrites, Initial 11/15/2024 NEGATIVE  NEGATIVE Final   Leukocyte Esterase 11/15/2024 2+ (A)  NEGATIVE Final   WBC, UA 11/15/2024 > OR = 60 (A)  0 - 5 /HPF Final   RBC / HPF 11/15/2024 0-2  0 - 2 /HPF Final   Squamous Epithelial / HPF 11/15/2024 0-5  < OR = 5 /HPF Final   Bacteria, UA 11/15/2024 MANY (A)  NONE SEEN /HPF Final   Hyaline Cast 11/15/2024 0-5 (A)  NONE SEEN /LPF Final   Note 11/15/2024    Final   REFLEXIVE URINE CULTURE 11/15/2024    Final  Office Visit on 09/08/2024  Component Date Value Ref Range Status   Color, Urine 09/08/2024 DARK YELLOW  YELLOW Final   APPearance 09/08/2024 CLEAR  CLEAR Final   Specific Gravity, Urine 09/08/2024 1.018  1.001 - 1.035 Final   pH 09/08/2024 6.0  5.0 - 8.0 Final   Glucose, UA 09/08/2024 TRACE (A)  NEGATIVE Final   Bilirubin Urine 09/08/2024 NEGATIVE  NEGATIVE Final   Ketones, ur 09/08/2024 NEGATIVE  NEGATIVE Final   Hgb urine dipstick 09/08/2024 TRACE (A)  NEGATIVE Final   Protein, ur 09/08/2024 2+ (A)  NEGATIVE Final   Nitrites, Initial 09/08/2024 NEGATIVE  NEGATIVE Final   Leukocyte Esterase 09/08/2024 NEGATIVE  NEGATIVE Final   WBC, UA 09/08/2024 0-5  0 - 5 /HPF Final   RBC / HPF 09/08/2024 NONE SEEN  0 - 2 /HPF Final   Squamous Epithelial / HPF 09/08/2024 0-5  < OR = 5 /HPF Final   Bacteria, UA 09/08/2024 NONE SEEN  NONE SEEN /HPF Final   Hyaline Cast 09/08/2024 NONE SEEN  NONE SEEN /LPF Final   Note 09/08/2024    Final   WBC 09/08/2024 4.2  4.0 - 10.5 K/uL Final   RBC 09/08/2024 2.97 (L)  4.22 - 5.81 Mil/uL Final   Hemoglobin 09/08/2024 10.5 (L)  13.0 - 17.0 g/dL Final   HCT 89/90/7974 31.5 (L)  39.0 - 52.0 % Final   MCV 09/08/2024 106.0 (H)  78.0 - 100.0 fl Final   MCHC 09/08/2024 33.5  30.0 - 36.0 g/dL Final   RDW 89/90/7974 16.4  (H)  11.5 - 15.5 % Final   Platelets 09/08/2024 50.0 Repeated and verified X2. (LL)  150.0 - 400.0 K/uL Final   Neutrophils Relative % 09/08/2024 63.6  43.0 - 77.0 % Final   Lymphocytes Relative 09/08/2024 16.8  12.0 - 46.0 % Final   Monocytes Relative 09/08/2024 15.5 (H)  3.0 - 12.0 % Final   Eosinophils Relative 09/08/2024 3.5  0.0 - 5.0 % Final   Basophils Relative 09/08/2024 0.6  0.0 - 3.0 % Final   Neutro Abs 09/08/2024 2.7  1.4 - 7.7 K/uL Final   Lymphs Abs 09/08/2024 0.7  0.7 - 4.0 K/uL Final   Monocytes Absolute 09/08/2024 0.7  0.1 - 1.0 K/uL Final   Eosinophils Absolute 09/08/2024 0.1  0.0 - 0.7 K/uL Final   Basophils Absolute 09/08/2024 0.0  0.0 - 0.1 K/uL Final   Sodium 09/08/2024 138  135 - 145 mEq/L Final   Potassium 09/08/2024 3.2 (L)  3.5 - 5.1 mEq/L Final   Chloride 09/08/2024 95 (L)  96 - 112 mEq/L Final   CO2 09/08/2024 30  19 - 32 mEq/L Final   Glucose, Bld 09/08/2024 135 (H)  70 - 99 mg/dL Final   BUN 89/90/7974 41 (H)  6 - 23 mg/dL Final   Creatinine, Ser 09/08/2024 7.27 (HH)  0.40 - 1.50 mg/dL Final   Total Bilirubin 09/08/2024 3.1 (H)  0.2 - 1.2 mg/dL Final   Alkaline Phosphatase 09/08/2024 297 (H)  39 - 117 U/L Final   AST 09/08/2024 74 (H)  0 - 37 U/L Final   ALT 09/08/2024 53  0 - 53 U/L Final   Total Protein 09/08/2024 6.8  6.0 - 8.3 g/dL Final   Albumin  09/08/2024 3.4 (L)  3.5 - 5.2 g/dL Final   GFR 89/90/7974 8.05 (LL)  >60.00 mL/min Final   Calcium  09/08/2024 9.2  8.4 - 10.5 mg/dL Final   Cholesterol 89/90/7974 195  0 - 200 mg/dL Final   Triglycerides 89/90/7974 238.0 (H)  0.0 - 149.0 mg/dL Final   HDL 89/90/7974 40.40  >39.00 mg/dL Final   VLDL 89/90/7974 47.6 (H)  0.0 - 40.0 mg/dL Final   LDL Cholesterol 09/08/2024 107 (H)  0 - 99 mg/dL Final   Total CHOL/HDL Ratio 09/08/2024 5   Final  NonHDL 09/08/2024 154.64   Final   Hgb A1c MFr Bld 09/08/2024 4.8  4.6 - 6.5 % Final   Microalb, Ur 09/08/2024 45.2 (H)  0.0 - 1.9 mg/dL Final   Creatinine,U  89/90/7974 126.2  mg/dL Final   Microalb Creat Ratio 09/08/2024 357.7 (H)  0.0 - 30.0 mg/g Final   TSH W/REFLEX TO FT4 09/08/2024 2.53  0.40 - 4.50 mIU/L Final   Iron 09/08/2024 110  50 - 180 mcg/dL Final   TIBC 89/90/7974 302  250 - 425 mcg/dL (calc) Final   %SAT 89/90/7974 36  20 - 48 % (calc) Final   Ferritin 09/08/2024 836 (H)  38 - 380 ng/mL Final   Reflexve Urine Culture 09/08/2024    Final  Office Visit on 09/01/2024  Component Date Value Ref Range Status   Color, UA 09/01/2024 dark yellow   Final   Clarity, UA 09/01/2024 cloudy   Final   Glucose, UA 09/01/2024 Positive (A)  Negative Final   Bilirubin, UA 09/01/2024 1 (A)   Final   Ketones, UA 09/01/2024 negative   Final   Spec Grav, UA 09/01/2024 1.025  1.010 - 1.025 Final   Blood, UA 09/01/2024 3 (AA)   Final   pH, UA 09/01/2024 5.5  5.0 - 8.0 Final   Protein, UA 09/01/2024 Negative  Negative Final   Urobilinogen, UA 09/01/2024 0.2  0.2 or 1.0 E.U./dL Final   Nitrite, UA 89/97/7974 negative   Final   Leukocytes, UA 09/01/2024 Small (1+) (A)  Negative Final   MICRO NUMBER: 09/01/2024 [id number]   Final   SPECIMEN QUALITY: 09/01/2024 Adequate   Final   Sample Source 09/01/2024 URINE   Final   STATUS: 09/01/2024 FINAL   Final   Result: 09/01/2024    Final                   Value:Mixed genital flora isolated. These superficial bacteria are not indicative of a urinary tract infection. No further organism identification is warranted on this specimen. If clinically indicated, recollect clean-catch, mid-stream urine and transfer  immediately to Urine Culture Transport Tube.    WBC, UA 09/01/2024 21-50/hpf (A)  0-2/hpf Final   RBC / HPF 09/01/2024 21-50/hpf (A)  0-2/hpf Final   Squamous Epithelial / HPF 09/01/2024 Rare(0-4/hpf)  Rare(0-4/hpf) Final   Bacteria, UA 09/01/2024 Rare(<10/hpf) (A)  None Final  Office Visit on 08/09/2024  Component Date Value Ref Range Status   Color, Urine 08/09/2024 Dark Yellow (A)  Yellow;Lt.  Yellow;Straw;Dark Yellow;Amber;Green;Red;Brown Final   APPearance 08/09/2024 SL CLOUDY (A)  Clear;Turbid;Slightly Cloudy;Cloudy Final   Specific Gravity, Urine 08/09/2024 >=1.030 (A)  1.000 - 1.030 Final   pH 08/09/2024 5.0  5.0 - 8.0 Final   Total Protein, Urine 08/09/2024 100 (A)  Negative Final   Urine Glucose 08/09/2024 100 (A)  Negative Final   Ketones, ur 08/09/2024 TRACE (A)  Negative Final   Bilirubin Urine 08/09/2024 MODERATE (A)  Negative Final   Hgb urine dipstick 08/09/2024 TRACE-LYSED (A)  Negative Final   Urobilinogen, UA 08/09/2024 4.0 (A)  0.0 - 1.0 Final   Leukocytes,Ua 08/09/2024 NEGATIVE  Negative Final   Nitrite 08/09/2024 NEGATIVE  Negative Final   WBC, UA 08/09/2024 3-6/hpf (A)  0-2/hpf Final   RBC / HPF 08/09/2024 0-2/hpf  0-2/hpf Final   Mucus, UA 08/09/2024 Presence of (A)  None Final   Squamous Epithelial / HPF 08/09/2024 Many(>10/hpf) (A)  Rare(0-4/hpf) Final   Bacteria, UA 08/09/2024 Few(10-50/hpf) (A)  None Final  Granular Casts, UA 08/09/2024 Presence of (A)  None Final   Hyaline Casts, UA 08/09/2024 Presence of (A)  None Final   MICRO NUMBER: 08/09/2024 [id number]   Final   SPECIMEN QUALITY: 08/09/2024 Adequate   Final   Sample Source 08/09/2024 URINE   Final   STATUS: 08/09/2024 FINAL   Final   Result: 08/09/2024 Less than 10,000 CFU/mL of single Gram positive organism isolated. No further testing will be performed. If clinically indicated, recollection using a method to minimize contamination, with prompt transfer to Urine Culture Transport Tube, is recommended.   Final   WBC 08/09/2024 5.3  4.0 - 10.5 K/uL Final   RBC 08/09/2024 2.76 (L)  4.22 - 5.81 Mil/uL Final   Hemoglobin 08/09/2024 9.9 (L)  13.0 - 17.0 g/dL Final   HCT 90/90/7974 29.2 (L)  39.0 - 52.0 % Final   MCV 08/09/2024 105.9 (H)  78.0 - 100.0 fl Final   MCHC 08/09/2024 33.9  30.0 - 36.0 g/dL Final   RDW 90/90/7974 15.5  11.5 - 15.5 % Final   Platelets 08/09/2024 51.0 (L)  150.0 - 400.0 K/uL  Final   Neutrophils Relative % 08/09/2024 51.9  43.0 - 77.0 % Final   Lymphocytes Relative 08/09/2024 18.0  12.0 - 46.0 % Final   Monocytes Relative 08/09/2024 20.8 Repeated and verified X2. (H)  3.0 - 12.0 % Final   Eosinophils Relative 08/09/2024 8.7 (H)  0.0 - 5.0 % Final   Basophils Relative 08/09/2024 0.6  0.0 - 3.0 % Final   Neutro Abs 08/09/2024 2.7  1.4 - 7.7 K/uL Final   Lymphs Abs 08/09/2024 1.0  0.7 - 4.0 K/uL Final   Monocytes Absolute 08/09/2024 1.1 (H)  0.1 - 1.0 K/uL Final   Eosinophils Absolute 08/09/2024 0.5  0.0 - 0.7 K/uL Final   Basophils Absolute 08/09/2024 0.0  0.0 - 0.1 K/uL Final   Sodium 08/09/2024 139  135 - 145 mEq/L Final   Potassium 08/09/2024 3.7  3.5 - 5.1 mEq/L Final   Chloride 08/09/2024 98  96 - 112 mEq/L Final   CO2 08/09/2024 27  19 - 32 mEq/L Final   Glucose, Bld 08/09/2024 93  70 - 99 mg/dL Final   BUN 90/90/7974 50 (H)  6 - 23 mg/dL Final   Creatinine, Ser 08/09/2024 9.15 (HH)  0.40 - 1.50 mg/dL Final   Total Bilirubin 08/09/2024 5.5 (H)  0.2 - 1.2 mg/dL Final   Alkaline Phosphatase 08/09/2024 322 (H)  39 - 117 U/L Final   AST 08/09/2024 62 (H)  0 - 37 U/L Final   ALT 08/09/2024 57 (H)  0 - 53 U/L Final   Total Protein 08/09/2024 6.5  6.0 - 8.3 g/dL Final   Albumin  08/09/2024 3.2 (L)  3.5 - 5.2 g/dL Final   GFR 90/90/7974 6.11 (LL)  >60.00 mL/min Final   Calcium  08/09/2024 9.1  8.4 - 10.5 mg/dL Final  Admission on 90/97/7974, Discharged on 08/06/2024  Component Date Value Ref Range Status   Sodium 08/02/2024 134 (L)  135 - 145 mmol/L Final   Potassium 08/02/2024 2.9 (L)  3.5 - 5.1 mmol/L Final   Chloride 08/02/2024 96 (L)  98 - 111 mmol/L Final   CO2 08/02/2024 22  22 - 32 mmol/L Final   Glucose, Bld 08/02/2024 154 (H)  70 - 99 mg/dL Final   BUN 90/97/7974 53 (H)  6 - 20 mg/dL Final   Creatinine, Ser 08/02/2024 6.94 (H)  0.61 - 1.24 mg/dL Final   Calcium   08/02/2024 10.2  8.9 - 10.3 mg/dL Final   Total Protein 90/97/7974 7.1  6.5 - 8.1 g/dL  Final   Albumin  08/02/2024 3.1 (L)  3.5 - 5.0 g/dL Final   AST 90/97/7974 75 (H)  15 - 41 U/L Final   ALT 08/02/2024 56 (H)  0 - 44 U/L Final   Alkaline Phosphatase 08/02/2024 217 (H)  38 - 126 U/L Final   Total Bilirubin 08/02/2024 2.2 (H)  0.0 - 1.2 mg/dL Final   GFR, Estimated 08/02/2024 9 (L)  >60 mL/min Final   Anion gap 08/02/2024 16 (H)  5 - 15 Final   WBC 08/02/2024 9.0  4.0 - 10.5 K/uL Final   RBC 08/02/2024 3.04 (L)  4.22 - 5.81 MIL/uL Final   Hemoglobin 08/02/2024 10.7 (L)  13.0 - 17.0 g/dL Final   HCT 90/97/7974 32.1 (L)  39.0 - 52.0 % Final   MCV 08/02/2024 105.6 (H)  80.0 - 100.0 fL Final   MCH 08/02/2024 35.2 (H)  26.0 - 34.0 pg Final   MCHC 08/02/2024 33.3  30.0 - 36.0 g/dL Final   RDW 90/97/7974 15.1  11.5 - 15.5 % Final   Platelets 08/02/2024 105 (L)  150 - 400 K/uL Final   nRBC 08/02/2024 0.0  0.0 - 0.2 % Final   Color, Urine 08/02/2024 YELLOW  YELLOW Final   APPearance 08/02/2024 HAZY (A)  CLEAR Final   Specific Gravity, Urine 08/02/2024 1.009  1.005 - 1.030 Final   pH 08/02/2024 5.0  5.0 - 8.0 Final   Glucose, UA 08/02/2024 NEGATIVE  NEGATIVE mg/dL Final   Hgb urine dipstick 08/02/2024 MODERATE (A)  NEGATIVE Final   Bilirubin Urine 08/02/2024 NEGATIVE  NEGATIVE Final   Ketones, ur 08/02/2024 NEGATIVE  NEGATIVE mg/dL Final   Protein, ur 90/97/7974 100 (A)  NEGATIVE mg/dL Final   Nitrite 90/97/7974 NEGATIVE  NEGATIVE Final   Leukocytes,Ua 08/02/2024 MODERATE (A)  NEGATIVE Final   RBC / HPF 08/02/2024 0-5  0 - 5 RBC/hpf Final   WBC, UA 08/02/2024 21-50  0 - 5 WBC/hpf Final   Bacteria, UA 08/02/2024 MANY (A)  NONE SEEN Final   Squamous Epithelial / HPF 08/02/2024 0-5  0 - 5 /HPF Final   WBC Clumps 08/02/2024 PRESENT   Final   Ammonia 08/02/2024 151 (H)  9 - 35 umol/L Final   Sodium 08/02/2024 135  135 - 145 mmol/L Final   Potassium 08/02/2024 2.9 (L)  3.5 - 5.1 mmol/L Final   Chloride 08/02/2024 98  98 - 111 mmol/L Final   BUN 08/02/2024 48 (H)  6 - 20 mg/dL  Final   Creatinine, Ser 08/02/2024 6.70 (H)  0.61 - 1.24 mg/dL Final   Glucose, Bld 90/97/7974 151 (H)  70 - 99 mg/dL Final   Calcium , Ion 08/02/2024 1.16  1.15 - 1.40 mmol/L Final   TCO2 08/02/2024 22  22 - 32 mmol/L Final   Hemoglobin 08/02/2024 11.2 (L)  13.0 - 17.0 g/dL Final   HCT 90/97/7974 33.0 (L)  39.0 - 52.0 % Final   Opiates 08/02/2024 NONE DETECTED  NONE DETECTED Final   Cocaine 08/02/2024 NONE DETECTED  NONE DETECTED Final   Benzodiazepines 08/02/2024 NONE DETECTED  NONE DETECTED Final   Amphetamines 08/02/2024 NONE DETECTED  NONE DETECTED Final   Tetrahydrocannabinol 08/02/2024 NONE DETECTED  NONE DETECTED Final   Barbiturates 08/02/2024 NONE DETECTED  NONE DETECTED Final   Alcohol, Ethyl (B) 08/02/2024 <15  <15 mg/dL Final   Prothrombin Time 08/02/2024 19.1 (H)  11.4 - 15.2 seconds Final   INR 08/02/2024 1.5 (H)  0.8 - 1.2 Final   Specimen Description 08/03/2024 BLOOD RIGHT ANTECUBITAL   Final   Special Requests 08/03/2024 BOTTLES DRAWN AEROBIC AND ANAEROBIC Blood Culture adequate volume   Final   Culture  Setup Time 08/03/2024    Final                   Value:GRAM POSITIVE RODS ANAEROBIC BOTTLE ONLY    Culture 08/03/2024  (A)   Final                   Value:CUTIBACTERIUM ACNES Standardized susceptibility testing for this organism is not available. Performed at [facility], [address] [place], [place] [address]    Report Status 08/03/2024 08/08/2024 FINAL   Final   Specimen Description 08/03/2024 BLOOD RIGHT ANTECUBITAL   Final   Special Requests 08/03/2024 BOTTLES DRAWN AEROBIC AND ANAEROBIC Blood Culture adequate volume   Final   Culture 08/03/2024    Final                   Value:NO GROWTH 5 DAYS Performed at [facility], [address] [place], [place] [address]    Report Status 08/03/2024 08/08/2024 FINAL   Final   Sodium 08/03/2024 134 (L)  135 - 145 mmol/L Final   Potassium 08/03/2024 3.1 (L)  3.5 - 5.1 mmol/L Final   Chloride 08/03/2024  95 (L)  98 - 111 mmol/L Final   CO2 08/03/2024 22  22 - 32 mmol/L Final   Glucose, Bld 08/03/2024 150 (H)  70 - 99 mg/dL Final   BUN 90/96/7974 57 (H)  6 - 20 mg/dL Final   Creatinine, Ser 08/03/2024 7.20 (H)  0.61 - 1.24 mg/dL Final   Calcium  08/03/2024 9.9  8.9 - 10.3 mg/dL Final   GFR, Estimated 08/03/2024 9 (L)  >60 mL/min Final   Anion gap 08/03/2024 17 (H)  5 - 15 Final   WBC 08/03/2024 7.7  4.0 - 10.5 K/uL Final   RBC 08/03/2024 2.99 (L)  4.22 - 5.81 MIL/uL Final   Hemoglobin 08/03/2024 10.8 (L)  13.0 - 17.0 g/dL Final   HCT 90/96/7974 31.4 (L)  39.0 - 52.0 % Final   MCV 08/03/2024 105.0 (H)  80.0 - 100.0 fL Final   MCH 08/03/2024 36.1 (H)  26.0 - 34.0 pg Final   MCHC 08/03/2024 34.4  30.0 - 36.0 g/dL Final   RDW 90/96/7974 15.1  11.5 - 15.5 % Final   Platelets 08/03/2024 93 (L)  150 - 400 K/uL Final   nRBC 08/03/2024 0.0  0.0 - 0.2 % Final   Magnesium  08/03/2024 2.3  1.7 - 2.4 mg/dL Final   Phosphorus 90/96/7974 6.8 (H)  2.5 - 4.6 mg/dL Final   Total Protein 90/96/7974 6.8  6.5 - 8.1 g/dL Final   Albumin  08/03/2024 3.0 (L)  3.5 - 5.0 g/dL Final   AST 90/96/7974 76 (H)  15 - 41 U/L Final   ALT 08/03/2024 54 (H)  0 - 44 U/L Final   Alkaline Phosphatase 08/03/2024 203 (H)  38 - 126 U/L Final   Total Bilirubin 08/03/2024 2.2 (H)  0.0 - 1.2 mg/dL Final   Bilirubin, Direct 08/03/2024 1.0 (H)  0.0 - 0.2 mg/dL Final   Indirect Bilirubin 08/03/2024 1.2 (H)  0.3 - 0.9 mg/dL Final   Ammonia 90/96/7974 150 (H)  9 - 35 umol/L Final   Prothrombin Time 08/03/2024  18.5 (H)  11.4 - 15.2 seconds Final   INR 08/03/2024 1.5 (H)  0.8 - 1.2 Final   HIV Screen 4th Generation wRfx 08/03/2024 Non Reactive  Non Reactive Final   Hep B S AB Quant (Post) 08/03/2024 7.0 (L)  Immunity>10 mIU/mL Final   Hepatitis B Surface Ag 08/03/2024 NON REACTIVE  NON REACTIVE Final   Sodium 08/04/2024 135  135 - 145 mmol/L Final   Potassium 08/04/2024 3.2 (L)  3.5 - 5.1 mmol/L Final   Chloride 08/04/2024 97 (L)  98 -  111 mmol/L Final   CO2 08/04/2024 22  22 - 32 mmol/L Final   Glucose, Bld 08/04/2024 167 (H)  70 - 99 mg/dL Final   BUN 90/95/7974 34 (H)  6 - 20 mg/dL Final   Creatinine, Ser 08/04/2024 5.57 (H)  0.61 - 1.24 mg/dL Final   Calcium  08/04/2024 8.9  8.9 - 10.3 mg/dL Final   GFR, Estimated 08/04/2024 12 (L)  >60 mL/min Final   Anion gap 08/04/2024 16 (H)  5 - 15 Final   WBC 08/04/2024 8.8  4.0 - 10.5 K/uL Final   RBC 08/04/2024 2.83 (L)  4.22 - 5.81 MIL/uL Final   Hemoglobin 08/04/2024 10.0 (L)  13.0 - 17.0 g/dL Final   HCT 90/95/7974 30.1 (L)  39.0 - 52.0 % Final   MCV 08/04/2024 106.4 (H)  80.0 - 100.0 fL Final   MCH 08/04/2024 35.3 (H)  26.0 - 34.0 pg Final   MCHC 08/04/2024 33.2  30.0 - 36.0 g/dL Final   RDW 90/95/7974 15.6 (H)  11.5 - 15.5 % Final   Platelets 08/04/2024 122 (L)  150 - 400 K/uL Final   nRBC 08/04/2024 0.0  0.0 - 0.2 % Final   Sodium 08/05/2024 136  135 - 145 mmol/L Final   Potassium 08/05/2024 3.5  3.5 - 5.1 mmol/L Final   Chloride 08/05/2024 98  98 - 111 mmol/L Final   CO2 08/05/2024 21 (L)  22 - 32 mmol/L Final   Glucose, Bld 08/05/2024 115 (H)  70 - 99 mg/dL Final   BUN 90/94/7974 63 (H)  6 - 20 mg/dL Final   Creatinine, Ser 08/05/2024 9.96 (H)  0.61 - 1.24 mg/dL Final   Calcium  08/05/2024 8.3 (L)  8.9 - 10.3 mg/dL Final   Phosphorus 90/94/7974 8.4 (H)  2.5 - 4.6 mg/dL Final   Albumin  08/05/2024 2.6 (L)  3.5 - 5.0 g/dL Final   GFR, Estimated 08/05/2024 6 (L)  >60 mL/min Final   Anion gap 08/05/2024 17 (H)  5 - 15 Final   WBC 08/05/2024 7.0  4.0 - 10.5 K/uL Final   RBC 08/05/2024 2.69 (L)  4.22 - 5.81 MIL/uL Final   Hemoglobin 08/05/2024 9.4 (L)  13.0 - 17.0 g/dL Final   HCT 90/94/7974 29.5 (L)  39.0 - 52.0 % Final   MCV 08/05/2024 109.7 (H)  80.0 - 100.0 fL Final   MCH 08/05/2024 34.9 (H)  26.0 - 34.0 pg Final   MCHC 08/05/2024 31.9  30.0 - 36.0 g/dL Final   RDW 90/94/7974 15.6 (H)  11.5 - 15.5 % Final   Platelets 08/05/2024 79 (L)  150 - 400 K/uL Final    nRBC 08/05/2024 0.0  0.0 - 0.2 % Final  Appointment on 07/19/2024  Component Date Value Ref Range Status   Kappa free light chain 07/19/2024 1,279.1 (H)  3.3 - 19.4 mg/L Final   Lambda free light chains 07/19/2024 45.7 (H)  5.7 - 26.3 mg/L Final  Kappa, lambda light chain ratio 07/19/2024 27.99 (H)  0.26 - 1.65 Final   IgG (Immunoglobin G), Serum 07/19/2024 659  603 - 1,613 mg/dL Final   IgA 91/80/7974 1,064 (H)  90 - 386 mg/dL Final   IgM (Immunoglobulin M), Srm 07/19/2024 83  20 - 172 mg/dL Final   Total Protein ELP 07/19/2024 6.2  6.0 - 8.5 g/dL Corrected   Albumin  SerPl Elph-Mcnc 07/19/2024 3.3  2.9 - 4.4 g/dL Corrected   Alpha 1 91/80/7974 0.3  0.0 - 0.4 g/dL Corrected   Alpha2 Glob SerPl Elph-Mcnc 07/19/2024 0.6  0.4 - 1.0 g/dL Corrected   B-Globulin SerPl Elph-Mcnc 07/19/2024 1.6 (H)  0.7 - 1.3 g/dL Corrected   Gamma Glob SerPl Elph-Mcnc 07/19/2024 0.5  0.4 - 1.8 g/dL Corrected   M Protein SerPl Elph-Mcnc 07/19/2024 0.7 (H)  Not Observed g/dL Corrected   Globulin, Total 07/19/2024 2.9  2.2 - 3.9 g/dL Corrected   Albumin /Glob SerPl 07/19/2024 1.2  0.7 - 1.7 Corrected   IFE 1 07/19/2024 Comment (A)   Corrected   Please Note 07/19/2024 Comment   Corrected   Vitamin B-12 07/19/2024 >7,500 (H)  180 - 914 pg/mL Final   Folate 07/19/2024 20.2  >5.9 ng/mL Final   Iron 07/19/2024 132  45 - 182 ug/dL Final   TIBC 91/80/7974 388  250 - 450 ug/dL Final   Saturation Ratios 07/19/2024 34  17.9 - 39.5 % Final   UIBC 07/19/2024 256  ug/dL Final   Ferritin 91/80/7974 547 (H)  24 - 336 ng/mL Final   Sodium 07/19/2024 139  135 - 145 mmol/L Final   Potassium 07/19/2024 3.5  3.5 - 5.1 mmol/L Final   Chloride 07/19/2024 99  98 - 111 mmol/L Final   CO2 07/19/2024 25  22 - 32 mmol/L Final   Glucose, Bld 07/19/2024 152 (H)  70 - 99 mg/dL Final   BUN 91/80/7974 44 (H)  6 - 20 mg/dL Final   Creatinine 91/80/7974 6.07 (H)  0.61 - 1.24 mg/dL Final   Calcium  07/19/2024 9.7  8.9 - 10.3 mg/dL Final    Total Protein 07/19/2024 6.5  6.5 - 8.1 g/dL Final   Albumin  07/19/2024 3.4 (L)  3.5 - 5.0 g/dL Final   AST 91/80/7974 87 (H)  15 - 41 U/L Final   ALT 07/19/2024 84 (H)  0 - 44 U/L Final   Alkaline Phosphatase 07/19/2024 286 (H)  38 - 126 U/L Final   Total Bilirubin 07/19/2024 2.1 (H)  0.0 - 1.2 mg/dL Final   GFR, Estimated 07/19/2024 10 (L)  >60 mL/min Final   Anion gap 07/19/2024 15  5 - 15 Final   WBC Count 07/19/2024 4.8  4.0 - 10.5 K/uL Final   RBC 07/19/2024 2.65 (L)  4.22 - 5.81 MIL/uL Final   Hemoglobin 07/19/2024 9.6 (L)  13.0 - 17.0 g/dL Final   HCT 91/80/7974 28.8 (L)  39.0 - 52.0 % Final   MCV 07/19/2024 108.7 (H)  80.0 - 100.0 fL Final   MCH 07/19/2024 36.2 (H)  26.0 - 34.0 pg Final   MCHC 07/19/2024 33.3  30.0 - 36.0 g/dL Final   RDW 91/80/7974 15.6 (H)  11.5 - 15.5 % Final   Platelet Count 07/19/2024 58 (L)  150 - 400 K/uL Final   nRBC 07/19/2024 0.0  0.0 - 0.2 % Final   Neutrophils Relative % 07/19/2024 60  % Final   Neutro Abs 07/19/2024 2.9  1.7 - 7.7 K/uL Final   Lymphocytes Relative 07/19/2024  21  % Final   Lymphs Abs 07/19/2024 1.0  0.7 - 4.0 K/uL Final   Monocytes Relative 07/19/2024 13  % Final   Monocytes Absolute 07/19/2024 0.6  0.1 - 1.0 K/uL Final   Eosinophils Relative 07/19/2024 4  % Final   Eosinophils Absolute 07/19/2024 0.2  0.0 - 0.5 K/uL Final   Basophils Relative 07/19/2024 1  % Final   Basophils Absolute 07/19/2024 0.0  0.0 - 0.1 K/uL Final   Immature Granulocytes 07/19/2024 1  % Final   Abs Immature Granulocytes 07/19/2024 0.05  0.00 - 0.07 K/uL Final  Office Visit on 06/16/2024  Component Date Value Ref Range Status   Color, Urine 06/16/2024 DARK YELLOW  YELLOW Final   APPearance 06/16/2024 TURBID (A)  CLEAR Final   Specific Gravity, Urine 06/16/2024 1.019  1.001 - 1.035 Final   pH 06/16/2024 5.5  5.0 - 8.0 Final   Glucose, UA 06/16/2024 NEGATIVE  NEGATIVE Final   Bilirubin Urine 06/16/2024 NEGATIVE  NEGATIVE Final   Ketones, ur 06/16/2024  TRACE (A)  NEGATIVE Final   Hgb urine dipstick 06/16/2024 TRACE (A)  NEGATIVE Final   Protein, ur 06/16/2024 2+ (A)  NEGATIVE Final   Nitrites, Initial 06/16/2024 NEGATIVE  NEGATIVE Final   Leukocyte Esterase 06/16/2024 3+ (A)  NEGATIVE Final   WBC, UA 06/16/2024 > OR = 60 (A)  0 - 5 /HPF Final   RBC / HPF 06/16/2024 NONE SEEN  0 - 2 /HPF Final   Squamous Epithelial / HPF 06/16/2024 0-5  < OR = 5 /HPF Final   Bacteria, UA 06/16/2024 MANY (A)  NONE SEEN /HPF Final   Hyaline Cast 06/16/2024 NONE SEEN  NONE SEEN /LPF Final   Note 06/16/2024    Final   MICRO NUMBER: 06/16/2024 [id number]   Final   SPECIMEN QUALITY: 06/16/2024 Adequate   Final   Sample Source 06/16/2024 URINE   Final   STATUS: 06/16/2024 FINAL   Final   ISOLATE 1: 06/16/2024 Klebsiella pneumoniae (A)   Final   REFLEXIVE URINE CULTURE 06/16/2024    Final  Admission on 05/06/2024, Discharged on 05/06/2024  Component Date Value Ref Range Status   Sodium 05/06/2024 135  135 - 145 mmol/L Final   Potassium 05/06/2024 3.7  3.5 - 5.1 mmol/L Final   Chloride 05/06/2024 101  98 - 111 mmol/L Final   BUN 05/06/2024 38 (H)  6 - 20 mg/dL Final   Creatinine, Ser 05/06/2024 7.50 (H)  0.61 - 1.24 mg/dL Final   Glucose, Bld 93/93/7974 166 (H)  70 - 99 mg/dL Final   Calcium , Ion 05/06/2024 1.02 (L)  1.15 - 1.40 mmol/L Final   TCO2 05/06/2024 23  22 - 32 mmol/L Final   Hemoglobin 05/06/2024 11.2 (L)  13.0 - 17.0 g/dL Final   HCT 93/93/7974 33.0 (L)  39.0 - 52.0 % Final  Scanned Document on 03/24/2024  Component Date Value Ref Range Status   Creatinine, POC 05/30/2023 95.2  mg/dL Final   EGFR 93/70/7975 18.0   Final  Office Visit on 03/14/2024  Component Date Value Ref Range Status   Cholesterol 03/14/2024 208 (H)  0 - 200 mg/dL Final   Triglycerides 95/85/7974 167.0 (H)  0.0 - 149.0 mg/dL Final   HDL 95/85/7974 50.60  >39.00 mg/dL Final   VLDL 95/85/7974 33.4  0.0 - 40.0 mg/dL Final   LDL Cholesterol 03/14/2024 124 (H)  0 - 99 mg/dL  Final   Total CHOL/HDL Ratio 03/14/2024 4   Final  NonHDL 03/14/2024 157.38   Final   Sodium 03/14/2024 137  135 - 145 mEq/L Final   Potassium 03/14/2024 4.3  3.5 - 5.1 mEq/L Final   Chloride 03/14/2024 98  96 - 112 mEq/L Final   CO2 03/14/2024 22  19 - 32 mEq/L Final   Glucose, Bld 03/14/2024 152 (H)  70 - 99 mg/dL Final   BUN 95/85/7974 69 (H)  6 - 23 mg/dL Final   Creatinine, Ser 03/14/2024 10.96 (HH)  0.40 - 1.50 mg/dL Final   Total Bilirubin 03/14/2024 1.6 (H)  0.2 - 1.2 mg/dL Final   Alkaline Phosphatase 03/14/2024 244 (H)  39 - 117 U/L Final   AST 03/14/2024 55 (H)  0 - 37 U/L Final   ALT 03/14/2024 64 (H)  0 - 53 U/L Final   Total Protein 03/14/2024 6.1  6.0 - 8.3 g/dL Final   Albumin  03/14/2024 3.3 (L)  3.5 - 5.2 g/dL Final   GFR 95/85/7974 4.94 (LL)  >60.00 mL/min Final   Calcium  03/14/2024 9.2  8.4 - 10.5 mg/dL Final   WBC 95/85/7974 7.8  4.0 - 10.5 K/uL Final   RBC 03/14/2024 3.07 (L)  4.22 - 5.81 Mil/uL Final   Hemoglobin 03/14/2024 11.0 (L)  13.0 - 17.0 g/dL Final   HCT 95/85/7974 32.0 (L)  39.0 - 52.0 % Final   MCV 03/14/2024 104.2 (H)  78.0 - 100.0 fl Final   MCHC 03/14/2024 34.4  30.0 - 36.0 g/dL Final   RDW 95/85/7974 15.9 (H)  11.5 - 15.5 % Final   Platelets 03/14/2024 89.0 (L)  150.0 - 400.0 K/uL Final   Neutrophils Relative % 03/14/2024 59.4  43.0 - 77.0 % Final   Lymphocytes Relative 03/14/2024 17.6  12.0 - 46.0 % Final   Monocytes Relative 03/14/2024 16.0 (H)  3.0 - 12.0 % Final   Eosinophils Relative 03/14/2024 6.6 (H)  0.0 - 5.0 % Final   Basophils Relative 03/14/2024 0.4  0.0 - 3.0 % Final   Neutro Abs 03/14/2024 4.6  1.4 - 7.7 K/uL Final   Lymphs Abs 03/14/2024 1.4  0.7 - 4.0 K/uL Final   Monocytes Absolute 03/14/2024 1.2 (H)  0.1 - 1.0 K/uL Final   Eosinophils Absolute 03/14/2024 0.5  0.0 - 0.7 K/uL Final   Basophils Absolute 03/14/2024 0.0  0.0 - 0.1 K/uL Final   TSH 03/14/2024 2.070  0.450 - 4.500 uIU/mL Final   Hgb A1c MFr Bld 03/14/2024 6.1  4.6 -  6.5 % Final   Magnesium  03/14/2024 2.4  1.5 - 2.5 mg/dL Final   Phosphorus 95/85/7974 8.1 (H)  2.3 - 4.6 mg/dL Final  There may be more visits with results that are not included.  No image results found. No results found.       ASSESSMENT & PLAN   Assessment & Plan Acute cystitis with hematuria Dysuria Frequency of urination Acute cystitis with hematuria, possible nephrolithiasis   He presents with urinary tract symptoms, including a burning sensation during urination and hematuria, suggesting a UTI with possible nephrolithiasis due to stasis and stone formation. Previous Z-Pak use was not ideal for UTI but helped with a cough. Ordered urinalysis and urine culture to assess for infection and stones. Prescribed Cipro  500 mg every 24 hours for 7-10 days, administered after dialysis. Encouraged increased fluid intake to flush out potential stones. Ordered a CT scan of the abdomen and pelvis to evaluate for stones. Prediabetes Lab Results  Component Value Date   HGBA1C 4.8 09/08/2024   HGBA1C  6.1 03/14/2024   HGBA1C 5.6 06/17/2023   HGBA1C 5.2 09/30/2022   HGBA1C 5.6 12/23/2015  Despite Body mass index is 50.04 kg/m. He has no significant insulin  resistance. ESRD (end stage renal disease) (HCC) End stage renal disease on dialysis   He manages this chronic condition with dialysis, facing challenges in fluid management due to intake restrictions. Symptoms of cramping post-dialysis may relate to fluid removal. Encouraged increased fluid intake to manage urinary symptoms, despite dialysis restrictions. Consulted nephrology regarding post-dialysis cramps and fluid management. Decompensated cirrhosis (HCC) Metabolic dysfunction-associated steatohepatitis (MASH) Chronic diastolic CHF (congestive heart failure) (HCC) Dependence on renal dialysis Compliance with fluid management recommendations and hemodialysis managed by nephrology Nephrology wants patient on Zepbound  as well so will continue  to seek prior authorization approval. Obstructive sleep apnea syndrome Patient reports  PUD (peptic ulcer disease) Gastroesophageal reflux disease without esophagitis Generalized abdominal pain Hiatal hernia with GERD and esophagitis Hiatal hernia with GERD and esophagitis   He experiences persistent GERD symptoms, including vomiting and abdominal pain, possibly exacerbated by a hiatal hernia. Previous trials of Protonix  and Prilosec were ineffective. Prescribed rabeprazole  (Aciphex ) as a replacement for Dexilant due to cost concerns. Referred to bariatric surgery for potential hiatal hernia repair. Morbid obesity (HCC) Morbid obesity   Previous attempts to manage weight with medications and referrals to bariatric surgery have been complicated by insurance issues. Resent bariatric surgery referral and submitted an appeal for Zepbound  with additional codes to support the bariatric referral. OSA on CPAP Obstructive sleep apnea on CPAP   This long-standing condition has been managed with CPAP since 2017. There may be a need for an updated sleep study if insurance requires it for further treatment. Other eczema He has skin dryness and itching, with a risk of scratching leading to skin damage and potential infection. Prescribed steroid cream to manage skin dryness and itching. Advised on nail care to prevent skin damage from scratching. Non-intractable cyclical vomiting with nausea He experiences intermittent vomiting episodes, possibly related to hiatal hernia and GERD, with symptoms worsening recently, possibly due to medication changes.  ORDER ASSOCIATIONS  #   DIAGNOSIS / CONDITION ICD-10 ENCOUNTER ORDER     ICD-10-CM   1. Acute cystitis with hematuria  N30.01 Urinalysis w microscopic + reflex cultur    CT ABDOMEN PELVIS WO CONTRAST    ciprofloxacin  (CIPRO ) 500 MG tablet    2. Frequency of urination  R35.0 POCT URINE DIPSTICK    Urinalysis w microscopic + reflex cultur    CANCELED: Urine  Culture    3. Prediabetes  R73.03 tirzepatide  (ZEPBOUND ) 2.5 MG/0.5ML Pen    4. ESRD (end stage renal disease) (HCC)  N18.6 tirzepatide  (ZEPBOUND ) 2.5 MG/0.5ML Pen    5. Metabolic dysfunction-associated steatohepatitis (MASH)  K75.81 tirzepatide  (ZEPBOUND ) 2.5 MG/0.5ML Pen    6. Chronic diastolic CHF (congestive heart failure) (HCC)  I50.32 tirzepatide  (ZEPBOUND ) 2.5 MG/0.5ML Pen    7. Decompensated cirrhosis (HCC)  K72.90    K74.60     8. Dependence on renal dialysis  Z99.2 tirzepatide  (ZEPBOUND ) 2.5 MG/0.5ML Pen    9. Obstructive sleep apnea syndrome  G47.33 Amb Referral to Bariatric Surgery    tirzepatide  (ZEPBOUND ) 2.5 MG/0.5ML Pen    10. PUD (peptic ulcer disease)  K27.9 Amb Referral to Bariatric Surgery    tirzepatide  (ZEPBOUND ) 2.5 MG/0.5ML Pen    11. Gastroesophageal reflux disease without esophagitis  K21.9 Amb Referral to Bariatric Surgery    tirzepatide  (ZEPBOUND ) 2.5 MG/0.5ML Pen    12. Hiatal hernia  with GERD and esophagitis  K44.9 Amb Referral to Bariatric Surgery   K21.00 tirzepatide  (ZEPBOUND ) 2.5 MG/0.5ML Pen    CT ABDOMEN PELVIS WO CONTRAST    13. Morbid obesity (HCC)  E66.01 Amb Referral to Bariatric Surgery    tirzepatide  (ZEPBOUND ) 2.5 MG/0.5ML Pen    14. OSA on CPAP  G47.33 tirzepatide  (ZEPBOUND ) 2.5 MG/0.5ML Pen    15. Generalized abdominal pain  R10.84 CT ABDOMEN PELVIS WO CONTRAST    16. Other eczema  L30.8 triamcinolone  cream (KENALOG ) 0.1 %    17. Non-intractable cyclical vomiting with nausea  R11.15     18. Dysuria  R30.0           Orders Placed in Encounter:  Lab Orders         Urine Culture         Urinalysis w microscopic + reflex cultur         REFLEXIVE URINE CULTURE         POCT URINE DIPSTICK    Imaging Orders         CT ABDOMEN PELVIS WO CONTRAST    Referral Orders         Amb Referral to Bariatric Surgery     Meds ordered this encounter  Medications   tirzepatide  (ZEPBOUND ) 2.5 MG/0.5ML Pen    Sig: Inject 2.5 mg into the  skin once a week.    Dispense:  2 mL    Refill:  11   RABEprazole  (ACIPHEX ) 20 MG tablet    Sig: Take 1 tablet (20 mg total) by mouth daily.    Dispense:  90 tablet    Refill:  4   triamcinolone  cream (KENALOG ) 0.1 %    Sig: Apply 1 Application topically 2 (two) times daily.    Dispense:  30 g    Refill:  0   ciprofloxacin  (CIPRO ) 500 MG tablet    Sig: Take 1 tablet (500 mg total) by mouth daily at 6 (six) AM for 10 days. 500 mg every 24 hours, administered after dialysis for complicated uti    Dispense:  10 tablet    Refill:  0    Orders Placed This Encounter  Procedures   Urine Culture   CT ABDOMEN PELVIS WO CONTRAST    358 pounds needs appropriately sized scanner reordering of previously expired Ordered by Jesus Bernardino MATSU, MD,For Hernia suspected, inguinal or femoral, hiatal hernia follow up, uncontrollable heartburn.surgical planning bariatric surgery and hiatal hernia repair, also follow up on aortic ectasia vs aneurysm  Also evaluate for kidney stones    Standing Status:   Future    Expiration Date:   11/15/2025    Preferred imaging location?:   [facility]    If indicated for the ordered procedure, I authorize the administration of oral contrast media per Radiology protocol:   Yes    Does the patient have a contrast media/X-ray dye allergy?:   No   Urinalysis w microscopic + reflex cultur   REFLEXIVE URINE CULTURE   Amb Referral to Bariatric Surgery    Referral Priority:   Routine    Referral Type:   Consultation    Number of Visits Requested:   1   POCT URINE DIPSTICK        This document was synthesized by artificial intelligence ([employer]) using HIPAA-compliant recording of the clinical interaction;   We discussed the use of AI scribe software for clinical note transcription with the patient, who gave verbal consent to proceed.  additional Info: This encounter employed state-of-the-art, real-time, collaborative documentation. The patient actively reviewed and  assisted in updating their electronic medical record on a shared screen, ensuring transparency and facilitating joint problem-solving for the problem list, overview, and plan. This approach promotes accurate, informed care. The treatment plan was discussed and reviewed in detail, including medication safety, potential side effects, and all patient questions. We confirmed understanding and comfort with the plan. Follow-up instructions were established, including contacting the office for any concerns, returning if symptoms worsen, persist, or new symptoms develop, and precautions for potential emergency department visits.

## 2024-11-16 NOTE — Assessment & Plan Note (Signed)
 Compliance with fluid management recommendations and hemodialysis managed by nephrology Nephrology wants patient on Zepbound  as well so will continue to seek prior authorization approval.

## 2024-11-16 NOTE — Assessment & Plan Note (Signed)
 Lab Results  Component Value Date   HGBA1C 4.8 09/08/2024   HGBA1C 6.1 03/14/2024   HGBA1C 5.6 06/17/2023   HGBA1C 5.2 09/30/2022   HGBA1C 5.6 12/23/2015  Despite Body mass index is 50.04 kg/m. He has no significant insulin  resistance.

## 2024-11-16 NOTE — Assessment & Plan Note (Signed)
 Patient reports:  -

## 2024-11-16 NOTE — Assessment & Plan Note (Signed)
 Hiatal hernia with GERD and esophagitis   He experiences persistent GERD symptoms, including vomiting and abdominal pain, possibly exacerbated by a hiatal hernia. Previous trials of Protonix  and Prilosec were ineffective. Prescribed rabeprazole  (Aciphex ) as a replacement for Dexilant due to cost concerns. Referred to bariatric surgery for potential hiatal hernia repair.

## 2024-11-18 ENCOUNTER — Other Ambulatory Visit: Payer: Self-pay | Admitting: Licensed Clinical Social Worker

## 2024-11-18 NOTE — Patient Outreach (Signed)
 Complex Care Management   Visit Note  11/18/2024  Name:  Sean [name] MRN: [id number] DOB: 09-Feb-1971  Situation: Referral received for Complex Care Management related to {Criteria:32550} I obtained verbal consent from {CHL AMB Patient/Caregiver:28184}.  Visit completed with {CHL AMB Patient/Caregiver:28184}  {VISIT LOCATION:32553}  Background:   Past Medical History:  Diagnosis Date   Abnormal liver enzymes    a. Sees a doctor in [place].   Acute renal failure superimposed on stage 5 chronic kidney disease, not on chronic dialysis (HCC) 01/08/2024   AKI (acute kidney injury) 06/17/2023   Atrial fibrillation (HCC)    Cardiac cirrhosis    a. possible elevated LFTs/low platelets felt due to cardiac cirrhosis per 2017 admission.   Chronic combined systolic and diastolic CHF (congestive heart failure) (HCC)    Chronic kidney disease, stage 4 (severe) (HCC) 01/04/2016   Lab Results      Component    Value    Date/Time           GFR    29.24 (L)    09/30/2022 02:51 PM           GFR    27.27 (L)    09/24/2022 03:40 PM           GFR    24.10 (L)    09/03/2022 03:45 PM           GFR    18.98 (L)    08/25/2022 12:21 PM           GFR    38.98 (L)    04/24/2022 02:39 PM   Seeing [facility]ist: Jerrye Katheryn BROCKS, MD  Lastt plan 04/2023: # CKD stage IV    CKD (chronic kidney disease) stage 3, GFR 30-59 ml/min (HCC) 01/04/2016   Lab Results  Component  Value  Date/Time     GFR  29.24 (L)  09/30/2022 02:51 PM     GFR  27.27 (L)  09/24/2022 03:40 PM     GFR  24.10 (L)  09/03/2022 03:45 PM     GFR  18.98 (L)  08/25/2022 12:21 PM     GFR  38.98 (L)  04/24/2022 02:39 PM         CKD (chronic kidney disease), stage II    Stage 4   Congenital heart defect    a. rightward rotation of heart, almost dextrocardia   Coronary artery disease    a. s/p CABGx1 in 12/2015.   Dyspnea    with exertion   Gastric polyps 11/25/2022   Gastritis and gastroduodenitis 11/25/2022           Gastroesophageal reflux disease with esophagitis and hemorrhage 04/04/2021   Hematuria    a. Chronic hx of this, no prior etiology determined through workup per patient.   History of umbilical hernia repair 07/10/2011   History of hernia surgery twice prior   Hypercholesteremia    a. Prev taken off statin due to abnormal liver function.   Hypertension    Incisional hernia 07/10/2011   History of hernia surgery twice prior   Morbid obesity (HCC)    Morbid obesity with BMI of 50.0-59.9, adult (HCC) 01/04/2016   After [facility] doctor is setting him up with a wellness clinic to help with weight losS  He reports not having tried anything for weight loss either diet or or medicine or surgery in the past  We failed to get Wegovy  10/2022      Wt Readings from  Last 10 Encounters:  11/13/22  (!) 384 lb 12.8 oz (174.5 kg)  10/13/22  (!) 385 lb (174.6 kg)  09/30/22  (!) 384 lb (174.2 kg)  09/24/22  (!) 382 lb (1   Nausea and vomiting 11/25/2022   OSA on CPAP    Paroxysmal atrial flutter (HCC) 02/12/2016   PONV (postoperative nausea and vomiting)    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   LIMA to LAD   Sinus bradycardia    Thrombocytopenia     Assessment: Patient Reported Symptoms:  Cognitive        Neurological      HEENT        Cardiovascular      Respiratory      Endocrine      Gastrointestinal        Genitourinary      Integumentary      Musculoskeletal          Psychosocial            11/18/2024    PHQ2-9 Depression Screening   Little interest or pleasure in doing things    Feeling down, depressed, or hopeless    PHQ-2 - Total Score    Trouble falling or staying asleep, or sleeping too much    Feeling tired or having little energy    Poor appetite or overeating     Feeling bad about yourself - or that you are a failure or have let yourself or your family down    Trouble concentrating on things, such as reading the newspaper or watching television    Moving or  speaking so slowly that other people could have noticed.  Or the opposite - being so fidgety or restless that you have been moving around a lot more than usual    Thoughts that you would be better off dead, or hurting yourself in some way    PHQ2-9 Total Score    If you checked off any problems, how difficult have these problems made it for you to do your work, take care of things at home, or get along with other people    Depression Interventions/Treatment      There were no vitals filed for this visit.    Medications Reviewed Today   Medications were not reviewed in this encounter     Recommendation:   {RECOMMENDATONS:32554}  Follow Up Plan:   {FOLLOWUP:32559}  SIG ***

## 2024-11-20 ENCOUNTER — Ambulatory Visit: Payer: Self-pay | Admitting: Internal Medicine

## 2024-11-20 LAB — URINALYSIS W MICROSCOPIC + REFLEX CULTURE
Bilirubin Urine: NEGATIVE
Glucose, UA: NEGATIVE
Nitrites, Initial: NEGATIVE
Specific Gravity, Urine: 1.018 (ref 1.001–1.035)
WBC, UA: >=60 /HPF — AB (ref 0–5)
pH: 5.5 (ref 5.0–8.0)

## 2024-11-20 LAB — URINE CULTURE
MICRO NUMBER:: 17367083
SPECIMEN QUALITY:: ADEQUATE

## 2024-11-20 LAB — CULTURE INDICATED

## 2024-11-20 NOTE — Patient Instructions (Signed)
 Visit Information  Thank you for taking time to visit with me today. Please don't hesitate to contact me if I can be of assistance to you before our next scheduled appointment.  Our next appointment is by telephone on 12/16/24 at 330 pm.  Please call the care guide team at [phone] if you need to cancel or reschedule your appointment.   Following is a copy of your care plan:   Goals Addressed             This Visit's Progress    VBCI Social Work Care Plan       Problems:   Financial Strain  and issues with finding local resources that he is financial eligible for  CSW Clinical Goal(s):   Over the next 30 days the Patient will Complete foodstamps/medicaid application- Patient was denied food stamps and Medicaid-BSW referral placed Interventions:  Social Determinants of Health in Patient with Atrial Fibrillation and HTN: SDOH assessments completed: Financial Strain , Food Insecurity , and Medical needs Evaluation of current treatment plan related to unmet needs LCSW completed assessment on 10/05/24 Paitent reports that patient is keeping busy and staying positive by going hunting as his main hobby and source for food. Referral to [facility] for financial relief-Pt was denied food stamps and Medicaid recently due to recent disability approval and income increase. Patient now receives SSDI. He has ESRD on thrice-weekly hemodialysis and is unable to work. Patient reports issues affording gas which is affecting his transportation needs.  Stress management coping skill education provided and [facility] therapy referral offered. Patient reports he will consider referral over the next 30 days.   Patient Goals/Self-Care Activities:  Call Department of Social Services [phone] to apply for Foodstamps and Medicaid. Continue taking your medication as prescribed.   Increase coping skills, healthy habits, self-management skills, and stress reduction  Plan:   The care management team will reach  out to the patient again over the next 30 days.        Please call the Suicide and Crisis Lifeline: 988 call the [place]  National Suicide Prevention Lifeline: (361)454-6197 or TTY: 939-042-2352 TTY 774-062-9444) to talk to a trained counselor call 1-800-273-TALK (toll free, 24 hour hotline) go to [facility] [address] [place] [phone]) call the [facility] Crisis Line: 217-289-9979 call 911 if you are experiencing a Mental Health or Behavioral Health Crisis or need someone to talk to.  Patient verbalized understanding of Care plan and visit instructions communicated this visit  Lyle Rung, BSW, MSW, LCSW Licensed Clinical Social Worker [employer]   [facility] [email or web address] Direct Dial : [phone]

## 2024-11-21 ENCOUNTER — Encounter: Payer: Self-pay | Admitting: Internal Medicine

## 2024-11-22 ENCOUNTER — Telehealth: Payer: Self-pay

## 2024-11-22 NOTE — Telephone Encounter (Signed)
 Copied from CRM #[id number]. Topic: Clinical - Medical Advice >> Nov 22, 2024 10:31 AM Rea ORN wrote: Reason for CRM: PT stated he was just informed that insurance denied his CT. Insurance said it has to be done in a hospital. Pt is asking for PCP to call back to figure out what to do next since he has an appt on 12/29.  Please call back [phone]

## 2024-11-23 ENCOUNTER — Other Ambulatory Visit (HOSPITAL_COMMUNITY): Payer: Self-pay

## 2024-11-23 NOTE — Telephone Encounter (Signed)
 Plan/Benefit exclusion

## 2024-11-25 ENCOUNTER — Inpatient Hospital Stay: Admission: RE | Admit: 2024-11-25 | Source: Ambulatory Visit

## 2024-11-25 ENCOUNTER — Telehealth: Payer: Self-pay

## 2024-11-25 ENCOUNTER — Encounter: Payer: Self-pay | Admitting: Internal Medicine

## 2024-11-25 ENCOUNTER — Other Ambulatory Visit: Payer: Self-pay | Admitting: Internal Medicine

## 2024-11-25 DIAGNOSIS — R161 Splenomegaly, not elsewhere classified: Secondary | ICD-10-CM

## 2024-11-25 DIAGNOSIS — I77819 Aortic ectasia, unspecified site: Secondary | ICD-10-CM

## 2024-11-25 DIAGNOSIS — K449 Diaphragmatic hernia without obstruction or gangrene: Secondary | ICD-10-CM

## 2024-11-25 DIAGNOSIS — R748 Abnormal levels of other serum enzymes: Secondary | ICD-10-CM

## 2024-11-25 DIAGNOSIS — N342 Other urethritis: Secondary | ICD-10-CM

## 2024-11-25 DIAGNOSIS — R198 Other specified symptoms and signs involving the digestive system and abdomen: Secondary | ICD-10-CM

## 2024-11-25 DIAGNOSIS — R110 Nausea: Secondary | ICD-10-CM

## 2024-11-25 DIAGNOSIS — Z992 Dependence on renal dialysis: Secondary | ICD-10-CM

## 2024-11-25 DIAGNOSIS — K7210 Chronic hepatic failure without coma: Secondary | ICD-10-CM

## 2024-11-25 DIAGNOSIS — Z8719 Personal history of other diseases of the digestive system: Secondary | ICD-10-CM

## 2024-11-25 NOTE — Telephone Encounter (Signed)
 Copied from CRM #[id number]. Topic: Clinical - Request for Lab/Test Order >> Nov 25, 2024  3:15 PM Viola F wrote: Reason for CRM: Janella from [facility] called requesting to speak to [facility] regarding CT SCAN order - patient prefers the CT scan to be done WITHOUT contrast due to dialysis. Janellas call back number is [phone] ext [phone]  Tried to call janella back to let her know that lisa has gotten it approved with out contrast now left message

## 2024-11-25 NOTE — Telephone Encounter (Signed)
 Spoke with pt let him know ct scan is 11/28/2024 at [address] . Pt states not able to make it to the appt wil call to see what he can do. Gave pt the number he will call.

## 2024-11-25 NOTE — Progress Notes (Signed)
 Was approved for CT abdomen with contrast but changed to without contrast due to end stage renal disease on dialysis.

## 2024-11-28 ENCOUNTER — Ambulatory Visit
Admission: RE | Admit: 2024-11-28 | Discharge: 2024-11-28 | Disposition: A | Source: Ambulatory Visit | Attending: Internal Medicine

## 2024-11-28 ENCOUNTER — Inpatient Hospital Stay: Admission: RE | Admit: 2024-11-28 | Source: Ambulatory Visit

## 2024-11-28 ENCOUNTER — Telehealth: Payer: Self-pay

## 2024-11-28 ENCOUNTER — Inpatient Hospital Stay: Admission: RE | Admit: 2024-11-28

## 2024-11-28 DIAGNOSIS — Z8719 Personal history of other diseases of the digestive system: Secondary | ICD-10-CM

## 2024-11-28 DIAGNOSIS — R748 Abnormal levels of other serum enzymes: Secondary | ICD-10-CM

## 2024-11-28 DIAGNOSIS — R161 Splenomegaly, not elsewhere classified: Secondary | ICD-10-CM

## 2024-11-28 DIAGNOSIS — I77819 Aortic ectasia, unspecified site: Secondary | ICD-10-CM

## 2024-11-28 DIAGNOSIS — R198 Other specified symptoms and signs involving the digestive system and abdomen: Secondary | ICD-10-CM

## 2024-11-28 DIAGNOSIS — K449 Diaphragmatic hernia without obstruction or gangrene: Secondary | ICD-10-CM

## 2024-11-28 DIAGNOSIS — Z992 Dependence on renal dialysis: Secondary | ICD-10-CM

## 2024-11-28 DIAGNOSIS — K7210 Chronic hepatic failure without coma: Secondary | ICD-10-CM

## 2024-11-28 DIAGNOSIS — R110 Nausea: Secondary | ICD-10-CM

## 2024-11-28 DIAGNOSIS — N342 Other urethritis: Secondary | ICD-10-CM

## 2024-11-28 NOTE — Telephone Encounter (Signed)
 Copied from CRM #[id number]. Topic: Clinical - Medical Advice >> Nov 28, 2024  9:16 AM Jasmin G wrote: Reason for CRM: [facility] called to check if Dr. Jesus wants pt to have an IV treatment or not. Please call back ASAP at [phone] ext. [phone], if that number cannot be reached opt. 1 and 4 can also be used with the same number provided. Spoke with dri pt is not to have no iv contrast per provider they understood and pt will have it without today

## 2024-11-28 NOTE — Telephone Encounter (Signed)
 Copied from CRM #[id number]. Topic: General - Other >> Nov 25, 2024  5:26 PM Drema MATSU wrote: Reason for CRM: Sean [name] received a fax from the clinic regarding an appeal for a CT Scan and she is needing to verify information. She is wanting to know if an appeal is still needed or not. She stated that it is a secure line to leave a voicemail.  Sean [name] took care of this Friday from referrals

## 2024-11-30 ENCOUNTER — Telehealth: Payer: Self-pay | Admitting: Internal Medicine

## 2024-11-30 NOTE — Telephone Encounter (Signed)
 Copied from CRM #[id number]. Topic: Referral - Prior Authorization Question >> Nov 30, 2024 11:46 AM Jayma L wrote: Reason for CRM: liz from [employer] called asking if a appeal was still needed please contact her back

## 2024-12-01 ENCOUNTER — Ambulatory Visit: Payer: Self-pay | Admitting: Internal Medicine

## 2024-12-01 NOTE — Progress Notes (Signed)
 Sent message

## 2024-12-02 NOTE — Telephone Encounter (Signed)
 Spoke with pt about results understood well has appt with pcp 15th of this month he has GI dr but does not have appt did advise pt to make one to follow up with. Pt understood well.

## 2024-12-05 ENCOUNTER — Telehealth: Payer: Self-pay

## 2024-12-05 NOTE — Telephone Encounter (Signed)
 FYI provider  Copied from CRM [id number]. Topic: Referral - Question >> Dec 05, 2024  8:32 AM Alfonso ORN wrote: Reason for CRM: Naomie Beals (mom) calling on behalf of patient , patient stated there is an issue with his insurance and have to hold off will not be able to attend the meeting on  12/08/24  for surgical option for weight loss ,  Dorothy Cortez(mom) have the number where the meeting will be taking place and will be calling to cancel the appt.

## 2024-12-15 ENCOUNTER — Encounter: Payer: Self-pay | Admitting: Internal Medicine

## 2024-12-15 ENCOUNTER — Ambulatory Visit (INDEPENDENT_AMBULATORY_CARE_PROVIDER_SITE_OTHER): Admitting: Internal Medicine

## 2024-12-15 ENCOUNTER — Other Ambulatory Visit (HOSPITAL_BASED_OUTPATIENT_CLINIC_OR_DEPARTMENT_OTHER): Payer: Self-pay

## 2024-12-15 VITALS — BP 140/60 | HR 88 | Temp 98.0°F | Ht 71.0 in | Wt 356.4 lb

## 2024-12-15 DIAGNOSIS — Z5987 Material hardship due to limited financial resources, not elsewhere classified: Secondary | ICD-10-CM

## 2024-12-15 DIAGNOSIS — L853 Xerosis cutis: Secondary | ICD-10-CM | POA: Diagnosis not present

## 2024-12-15 DIAGNOSIS — K7581 Nonalcoholic steatohepatitis (NASH): Secondary | ICD-10-CM | POA: Diagnosis not present

## 2024-12-15 DIAGNOSIS — A499 Bacterial infection, unspecified: Secondary | ICD-10-CM | POA: Insufficient documentation

## 2024-12-15 DIAGNOSIS — J320 Chronic maxillary sinusitis: Secondary | ICD-10-CM | POA: Diagnosis not present

## 2024-12-15 DIAGNOSIS — I700 Atherosclerosis of aorta: Secondary | ICD-10-CM | POA: Diagnosis not present

## 2024-12-15 DIAGNOSIS — K2100 Gastro-esophageal reflux disease with esophagitis, without bleeding: Secondary | ICD-10-CM | POA: Diagnosis not present

## 2024-12-15 DIAGNOSIS — Z992 Dependence on renal dialysis: Secondary | ICD-10-CM

## 2024-12-15 DIAGNOSIS — K449 Diaphragmatic hernia without obstruction or gangrene: Secondary | ICD-10-CM

## 2024-12-15 DIAGNOSIS — Z5971 Insufficient health insurance coverage: Secondary | ICD-10-CM | POA: Diagnosis not present

## 2024-12-15 MED ORDER — AZITHROMYCIN 250 MG PO TABS
ORAL_TABLET | ORAL | 0 refills | Status: AC
  Filled 2024-12-15: qty 6, 5d supply, fill #0

## 2024-12-15 MED ORDER — VOQUEZNA 20 MG PO TABS: 1.0000 | ORAL_TABLET | Freq: Every day | ORAL | 4 refills | Status: AC

## 2024-12-15 NOTE — Assessment & Plan Note (Signed)
 Material and insurance hardship affecting care   He faces significant financial hardship due to increased insurance costs. Efforts are underway to switch to Medicare and utilize [employer] assistance for deductibles. Social worker involvement is necessary to address financial issues. Efforts to switch to Medicare and utilize [employer] assistance will continue. A social worker is involved to assist with financial issues and insurance navigation.

## 2024-12-15 NOTE — Assessment & Plan Note (Signed)
 End-stage renal disease on dialysis   His end-stage renal disease is managed with dialysis. Blood pressure is monitored by nephrology. Potassium levels fluctuate with dialysis, and a high-protein diet is encouraged to address low protein levels. Post-dialysis cramps are a concern, and adjustments to dialysis may be needed. Dialysis will continue as scheduled. Blood pressure will be monitored with the nephrology team. High-potassium foods and a high-protein diet are encouraged as advised by nephrology. Post-dialysis cramps are discussed with the dialysis center for potential adjustments.

## 2024-12-15 NOTE — Assessment & Plan Note (Signed)
" °  Obesity with fatty liver disease   His obesity is associated with fatty liver disease. Weight loss is crucial to improve overall health and reduce pressure on the diaphragm. Surgical weight loss is considered as a potential option to facilitate weight loss and address the hiatal hernia. He is referred to a bariatric surgeon for evaluation of surgical weight loss options, and the potential benefits of weight loss on overall health and the hiatal hernia are discussed. "

## 2024-12-15 NOTE — Patient Instructions (Addendum)
 It was a pleasure seeing you today! Your health and satisfaction are our top priorities.  Sean Cone, MD  VISIT SUMMARY: During your visit, we discussed your ongoing sinus infection, medication management, and several other health concerns. We reviewed your symptoms, current treatments, and potential adjustments to your care plan. We also addressed your financial and insurance challenges and discussed possible solutions.  YOUR PLAN: -CHRONIC MAXILLARY SINUSITIS: Chronic maxillary sinusitis is a long-term inflammation of the sinuses causing nasal drainage and occasional nosebleeds. We administered Cefazolin  1 gram IV at the end of your dialysis session and provided a Z-Pak (azithromycin ) as an alternative if Cefazolin  is not administered.  -HIATAL HERNIA WITH GASTROESOPHAGEAL REFLUX DISEASE AND ESOPHAGITIS: A hiatal hernia occurs when part of the stomach pushes up through the diaphragm, causing symptoms like acid reflux and esophagitis. Your symptoms have improved with Voquezna , but insurance issues affect access. We recommend weight loss to reduce pressure on your diaphragm and esophagus, and we discussed surgical options. You are referred to a bariatric surgeon for evaluation and advised to avoid spicy foods.  -END-STAGE RENAL DISEASE ON DIALYSIS: End-stage renal disease is the final stage of chronic [name] disease where the kidneys no longer function properly, requiring dialysis. We will continue monitoring your blood pressure and potassium levels with the nephrology team. A high-protein diet is encouraged, and we discussed potential adjustments to dialysis to address post-dialysis cramps.  -PRURITUS AND XEROSIS SECONDARY TO DIALYSIS: Pruritus and xerosis are itching and dry skin, likely due to dialysis. We referred you to a dermatologist for further evaluation and management to prevent skin breakdown and infection.  -OBESITY WITH FATTY LIVER DISEASE: Obesity can lead to fatty liver disease, where  fat builds up in the liver. Weight loss is crucial to improve your overall health and reduce pressure on your diaphragm. We referred you to a bariatric surgeon to explore surgical weight loss options.  -AORTIC ATHEROSCLEROSIS AND LUMBAR SPONDYLOSIS: Aortic atherosclerosis is the hardening of the arteries, and lumbar spondylosis is the degeneration of the spine. We will continue your anticoagulation medication, Eliquis , and encourage weight loss to reduce cardiovascular risk.  -MATERIAL AND INSURANCE HARDSHIP AFFECTING CARE: You are facing financial challenges due to increased insurance costs. We are working on switching you to [employer] and seeking assistance from the [employer]. A social worker is involved to help with financial issues and insurance navigation.  INSTRUCTIONS: Please follow up with the bariatric surgeon for evaluation of surgical weight loss options. Continue to monitor your blood pressure and potassium levels with the nephrology team. Schedule an appointment with a dermatologist for your pruritus and xerosis. Keep us  updated on your progress with Medicare and the [employer] assistance. Avoid  spicy foods to prevent exacerbating your hiatal hernia symptoms.  Your Providers PCP: Sean Sean MATSU, MD,  [phone]) Referring Provider: [name] MATSU, MD,  [phone]) Care Team Provider: Santo [name] LABOR, MD,  [phone]) Care Team Provider: Alvia Olam BIRCH, RN Care Team Provider: Ezzard Sean LABOR, MD,  [phone]) Care Team Provider: Tobie Gordy POUR, MD,  [phone]) Care Team Provider: Center, Sean Sean,  [phone]) Care Team Provider: May, Sean J, NP,  [phone]) Care Team Provider: Merlynn Lyle CROME, LCSW  NEXT STEPS: [x]  Early Intervention: Schedule sooner appointment, call our on-call services, or go to emergency room if there is any significant Increase in pain or discomfort New or worsening symptoms Sudden  or severe changes in your health [x]  Flexible Follow-Up: We recommend a No follow-ups on file.  for optimal routine care. This allows for progress monitoring and treatment adjustments. [x]  Preventive Care: Schedule your annual preventive care visit! It's typically covered by insurance and helps identify potential health issues early. [x]  Lab & X-ray Appointments: Incomplete tests scheduled today, or call to schedule. X-rays: [facility] (Sean-F, 8:30am-noon or 1pm-5pm). [x]  Medical Information Release: Sign a release form at front desk to obtain relevant medical information we don't have.  MAKING THE MOST OF OUR FOCUSED 20 MINUTE APPOINTMENTS: [x]   Clearly state your top concerns at the beginning of the visit to focus our discussion [x]   If you anticipate you will need more time, please inform the front desk during scheduling - we can book multiple appointments in the same week. [x]   If you have transportation problems- use our convenient video appointments or ask about transportation support. [x]   We can get down to business faster if you use MyChart to update information before the visit and submit non-urgent questions before your visit. Thank you for taking the time to provide details through MyChart.  Let our nurse know and she can import this information into your encounter documents.  Arrival and Wait Times: [x]   Arriving on time ensures that everyone receives prompt attention. [x]   Early morning (8a) and afternoon (1p) appointments tend to have shortest wait times. [x]   Unfortunately, we cannot delay appointments for late arrivals or hold slots during phone calls.  Getting Answers and Following Up [x]   Simple Questions & Concerns: For quick questions or basic follow-up after your visit, reach us  at [phone] or MyChart messaging. [x]   Complex Concerns: If your concern is more complex, scheduling an appointment might be best. Discuss this with the staff to find the most  suitable option. [x]   Lab & Imaging Results: We'll contact you directly if results are abnormal or you don't use MyChart. Most normal results will be on MyChart within 2-3 business days, with a review message from Dr. Jesus. Haven't heard back in 2 weeks? Need results sooner? Contact us  at [phone]. [x]   Referrals: Our referral coordinator will manage specialist referrals. The specialist's office should contact you within 2 weeks to schedule an appointment. Call us  if you haven't heard from them after 2 weeks.  Staying Connected [x]   MyChart: Activate your MyChart for the fastest way to access results and message us . See the last page of this paperwork for instructions on how to activate.  Bring to Your Next Appointment [x]   Medications: Please bring all your medication bottles to your next appointment to ensure we have an accurate record of your prescriptions. [x]   Health Diaries: If you're monitoring any health conditions at home, keeping a diary of your readings can be very helpful for discussions at your next appointment.  Billing [x]   X-ray & Lab Orders: These are billed by separate companies. Contact the invoicing company directly for questions or concerns. [x]   Visit Charges: Discuss any billing inquiries with our administrative services team.  Your Satisfaction Matters [x]   Share Your Experience: We strive for your satisfaction! If you have any complaints, or preferably compliments, please let Dr. Jesus know directly or contact our Practice Administrators, Manuelita Rubin or [employer], by asking at the front desk.   Reviewing Your Records [x]   Review this early draft of your clinical encounter notes below and the final encounter summary tomorrow on MyChart after its been completed.  All orders placed so far are visible here: Xerosis of skin -  Ambulatory referral to Dermatology  Insurance coverage problems -     AMB Referral VBCI Care Management  Unable to obtain  health insurance coverage due to limited financial resources -     AMB Referral VBCI Care Management  Inadequate material resources  Dependence on renal dialysis  Chronic maxillary sinusitis -     Azithromycin ; Take 2 tablets by mouth on day 1, then 1 tablet daily on days 2 through 5.  Dispense: 6 tablet; Refill: 0  Gastroesophageal reflux disease with esophagitis without hemorrhage -     Voquezna ; Take 1 tablet by mouth daily at 6 (six) AM.  Dispense: 90 tablet; Refill: 4  Hiatal hernia with GERD and esophagitis  Metabolic dysfunction-associated steatohepatitis (MASH)  Atherosclerosis of aorta            Heres a concise table of common potassium-rich foods:  Food Approx. Potassium (mg per serving) Serving Size White beans   1,000 mg 1 cup (cooked) Lentils    730 mg 1 cup (cooked) Baked potato (with skin) 925 mg 1 medium Sweet potato   540 mg 1 medium Spinach   840 mg 1 cup (cooked) Swiss chard   960 mg 1 cup (cooked) Avocado   975 mg 1 whole Banana   420 mg 1 medium Orange juice   500 mg 1 cup Cantaloupe   430 mg 1 cup (cubed) Yogurt (plain, low-fat)  575 mg 1 cup Salmon   620 mg 3 oz (cooked) Chicken breast  440 mg 3 oz (cooked) Mushrooms (white)  425 mg 1 cup (cooked) Tomato sauce   725 mg 1 cup  Rationale: Potassium is abundant in beans, leafy greens, starchy vegetables, fruits, dairy, and lean meats. The listed foods balance plant and animal sources.  Alternatives: Other good sources include prunes, dried apricots, clams, cod, edamame, pumpkin, and beets.  Next actions:  Use this table to guide diet planning. If for medical purposes (e.g., [name] disease), potassium intake should be personalized -- check labs before major dietary shifts.    Cefazolin  (1 g IV post-dialysis) is the safest and most effective antibiotic for chronic sinusitis in hemodialysis patients, particularly in settings with low methicillin-resistant Staphylococcus aureus (MRSA)  prevalence. This recommendation is supported by pharmacokinetic data showing safe peak and trough levels with post-dialysis dosing (750 mg for patients <50 kg)

## 2024-12-15 NOTE — Assessment & Plan Note (Signed)
 Hiatal hernia with gastroesophageal reflux disease and esophagitis   His hiatal hernia with GERD and esophagitis symptoms have improved with Voquezna , though insurance issues affect medication access. Weight loss is recommended to reduce pressure on the diaphragm and esophagus. Surgical intervention is discussed as a potential option to address the hiatal hernia and facilitate weight loss. Voquezna  is prescribed. He is referred to a bariatric surgeon for evaluation of surgical weight loss options and advised to avoid spicy foods to prevent symptom exacerbation.

## 2024-12-15 NOTE — Progress Notes (Signed)
 ==============================  [facility]: [phone]   -- Medical Office Visit --  Patient: Sean [name]      Age: 54 y.o.       Sex:  male  Date:   12/15/2024 Today's Healthcare Provider: Bernardino KANDICE Cone, MD  ==============================   Chief Complaint: Cough and Nasal Congestion (Has been going on for two weeks )   Discussed the use of AI scribe software for clinical note transcription with the patient, who gave verbal consent to proceed.  History of Present Illness 54 year old male with dialysis-dependent kidney disease who presents with sinus infection and medication management issues.  He has been experiencing sinus issues, including drainage and occasional epistaxis, for the past two to three weeks. He has a history of sinus infections and has previously been treated with antibiotics. He is allergic to Augmentin and has experienced shortness of breath with doxycycline .  His hiatal hernia symptoms have improved with the use of Voquezna  samples, but he has run out of samples and is concerned about the cost of purchasing them due to a significant increase in his insurance premiums. His insurance through [employer] and [employer] increased from $268 to $1200 per month, prompting him to consider switching to Medicare. He is awaiting a response from the [employer] regarding assistance with deductibles.  He experiences itching during dialysis sessions, which he attributes to the process itself. He has noticed that many others at the dialysis center also experience dry skin and itching. He does not currently have a dermatologist but is considering seeing one to address the issue.  He underwent a CT scan of his abdomen in [place], which was initially denied by his insurance but later approved after intervention. He continues to experience some abdominal pain, particularly after consuming spicy foods. He tries to avoid spicy  foods to prevent exacerbating his symptoms.  He is on dialysis and is managing his protein and potassium intake. He consumes orange juice to help with potassium levels. He is currently taking Eliquis  and has not experienced any recent chest pain other than cramps in his chest after dialysis. He takes medication for gout and has not had any recent flares. His blood pressure is monitored during dialysis sessions.  Background Reviewed: Problem List: has History of umbilical hernia repair; Hypertension; Hyperlipidemia; Thrombocytopenia; Sinus bradycardia; Atherosclerotic heart disease of native coronary artery without angina pectoris; Atrial flutter with rapid ventricular response (HCC); Coronary artery disease involving native coronary artery of native heart without angina pectoris; Other long term (current) drug therapy; History of coronary artery bypass surgery; Obstructive sleep apnea syndrome; Chronic gout of multiple sites; Alkaline phosphatase elevation; Morbid obesity with BMI of 50.0-59.9, adult (HCC); Gout; Ectatic aorta; Gastroesophageal reflux disease without esophagitis; Herpes simplex; Metabolic dysfunction-associated steatohepatitis (MASH); Prediabetes; Statins contraindicated; Hypothyroid; Chronic venous stasis dermatitis of both lower extremities; Chronic nausea; Chronic liver failure without hepatic coma (HCC); Splenomegaly; Spondylolisthesis; Bleeding diathesis; Malaise and fatigue; Allergic rhinitis due to pollen; Amitriptyline  adverse reaction; MGUS (monoclonal gammopathy of unknown significance); 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; Recurrent sinusitis; Asthma, chronic; ESRD (end stage renal disease) (HCC); Atrial fibrillation (HCC); PUD (peptic ulcer disease); Disability affecting daily living; Inflamed internal hemorrhoid; Anemia in chronic kidney disease; Chronic diastolic CHF (congestive heart failure) (HCC); Coagulation defect, unspecified; Dependence on renal dialysis; Gout due  to renal impairment, unspecified site; Hypertensive chronic kidney disease with stage 5 chronic kidney disease or end stage renal disease (HCC); Mild protein-calorie  malnutrition; Other chronic pain; Other disorders of phosphorus metabolism; Other fluid overload; Iron deficiency anemia, unspecified; Other iron deficiency anemias; Pruritus, unspecified; Pure hypercholesterolemia, unspecified; Secondary hyperparathyroidism of renal origin; Shortness of breath; Abnormal bowel habits; Inadequate material resources; Decompensated cirrhosis (HCC); Hiatal hernia with GERD and esophagitis; Allergy, unspecified, initial encounter; Anaphylactic shock, unspecified, initial encounter; and Encounter for fitting and adjustment of extracorporeal dialysis catheter on their problem list. Past Medical History:  has a past medical history of Abnormal liver enzymes, Acute hepatic encephalopathy (HCC) (08/03/2024), Acute renal failure superimposed on stage 5 chronic kidney disease, not on chronic dialysis (HCC) (01/08/2024), AKI (acute kidney injury) (06/17/2023), Atrial fibrillation (HCC), Cardiac cirrhosis, Chronic combined systolic and diastolic CHF (congestive heart failure) (HCC), Chronic kidney disease, stage 4 (severe) (HCC) (01/04/2016), CKD (chronic kidney disease) stage 3, GFR 30-59 ml/min (HCC) (01/04/2016), CKD (chronic kidney disease), stage II, Congenital heart defect, Coronary artery disease, Dyspnea, Gastric polyps (11/25/2022), Gastritis and gastroduodenitis (11/25/2022), Gastroesophageal reflux disease with esophagitis and hemorrhage (04/04/2021), Hematuria, History of umbilical hernia repair (07/10/2011), Hypercholesteremia, Hypertension, Incisional hernia (07/10/2011), Morbid obesity (HCC), Morbid obesity with BMI of 50.0-59.9, adult (HCC) (01/04/2016), Nausea and vomiting (11/25/2022), OSA on CPAP, Paroxysmal atrial flutter (HCC) (02/12/2016), PONV (postoperative nausea and vomiting), Rectal bleeding (11/03/2023),  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 Past Surgical History:   has a past surgical history that includes Gallbladder surgery; Appendectomy; Hernia repair; Cardiac catheterization (N/A, 12/24/2015); Coronary artery bypass graft (N/A, 12/26/2015); TEE without cardioversion (N/A, 12/26/2015); Cardioversion (N/A, 02/14/2016); TEE without cardioversion (N/A, 02/14/2016); LEFT HEART CATH AND CORS/GRAFTS ANGIOGRAPHY (N/A, 04/15/2017); LEFT HEART CATH AND CORS/GRAFTS ANGIOGRAPHY (N/A, 11/02/2020); Colonoscopy with propofol  (N/A, 02/19/2021); Esophagogastroduodenoscopy (egd) with propofol  (N/A, 02/19/2021); biopsy (02/19/2021); polypectomy (02/19/2021); Esophagogastroduodenoscopy (egd) with propofol  (N/A, 11/25/2022); biopsy (11/25/2022); IR Transcatheter BX (06/26/2023); IR US  Guide Vasc Access Right (06/26/2023); IR Venogram Hepatic W Hemodynamic Evaluation (06/26/2023); AV fistula placement (Left, 01/11/2024); Insertion of dialysis catheter (Right, 01/11/2024); Fistula superficialization (Left, 05/06/2024); and Ligation of competing branches of arteriovenous fistula (Left, 05/06/2024). Social History:   reports that he has never smoked. He has never used smokeless tobacco. He reports that he does not currently use alcohol. He reports that he does not use drugs. Family History:  family history includes CAD in his father; Diabetes in his father, maternal grandfather, and maternal uncle; Heart attack in an other family member; Hyperlipidemia in an other family member. Allergies:  is allergic to glucophage  [metformin ], vibra -tab [doxycycline ], chlorhexidine , chlorhexidine  gluconate, augmentin [amoxicillin-pot clavulanate], imdur  [isosorbide  dinitrate], tape, and valacyclovir .   Medication Reconciliation: Current Outpatient Medications on File Prior to Visit  Medication Sig   albuterol  (VENTOLIN  HFA) 108 (90 Base) MCG/ACT inhaler Inhale 2 puffs into the  lungs every 4 (four) hours as needed.   allopurinol  (ZYLOPRIM ) 100 MG tablet TAKE 1 TABLET BY MOUTH EVERY DAY   amitriptyline  (ELAVIL ) 25 MG tablet TAKE 1 TABLET BY MOUTH EVERYDAY AT BEDTIME   azelastine  (ASTELIN ) 0.1 % nasal spray Place 2 sprays into both nostrils 2 (two) times daily. Use in each nostril as directed   B Complex-C-Folic Acid  (DIALYVITE  TABLET) TABS Take 1 tablet by mouth daily.   Cyanocobalamin  (VITAMIN B-12) 5000 MCG SUBL Place 5,000 mcg under the tongue daily.   diltiazem  (CARDIZEM  SR) 120 MG 12 hr capsule Take 1 capsule (120 mg total) by mouth 2 (two) times daily.   ELIQUIS  5 MG TABS tablet TAKE 1 TABLET BY MOUTH TWICE A DAY   Evolocumab  (  REPATHA ) 140 MG/ML SOSY Inject 140 mg into the skin every 14 (fourteen) days.   folic acid -vitamin b complex-vitamin c-selenium-zinc (DIALYVITE ) 3 MG TABS tablet Take 1 tablet by mouth daily.   hydrocortisone  (ANUSOL -HC) 25 MG suppository Place 1 suppository (25 mg total) rectally 2 (two) times daily.   lactulose  (CHRONULAC ) 10 GM/15ML solution TAKE 45 MLS 4 TIMES DAILY AS NEEDED (TO ACHEIVE GOAL OF 2-3 BMS DAILY). TITRATE BASED ON THE STOOL FREQUENCY.   lactulose , encephalopathy, (CHRONULAC ) 10 GM/15ML SOLN Take 30 mLs by mouth in the morning and at bedtime.   lanthanum  (FOSRENOL ) 1000 MG chewable tablet Chew 1,000 mg by mouth 3 (three) times daily with meals.   losartan  (COZAAR ) 50 MG tablet Take 1 tablet (50 mg total) by mouth daily. (Patient not taking: Reported on 09/08/2024)   Methoxy PEG-Epoetin Beta (MIRCERA IJ) 75 mcg.   montelukast  (SINGULAIR ) 10 MG tablet TAKE 1 TABLET BY MOUTH EVERY DAY   mupirocin  ointment (BACTROBAN ) 2 % Apply to inner nares twice daily   neomycin -polymyxin-hydrocortisone  (CORTISPORIN) 3.5-10000-1 OTIC suspension Apply 1-2 drops daily after soaking and cover with bandaid   nitroGLYCERIN  (NITROSTAT ) 0.4 MG SL tablet Place 1 tablet (0.4 mg total) under the tongue every 5 (five) minutes as needed for chest pain.    ondansetron  (ZOFRAN -ODT) 4 MG disintegrating tablet Take 1 tablet (4 mg total) by mouth every 8 (eight) hours as needed for nausea or vomiting.   oxyCODONE -acetaminophen  (PERCOCET) 5-325 MG tablet Take 1 tablet by mouth every 6 (six) hours as needed for severe pain (pain score 7-10).   pantoprazole  (PROTONIX ) 40 MG tablet Take 2 tablets (80 mg total) by mouth daily.   polyethylene glycol (MIRALAX  / GLYCOLAX ) 17 g packet Take 17 g by mouth daily as needed. Titrate based on the stool frequency. Goal :2-3 BMs a day,   promethazine  (PHENERGAN ) 25 MG tablet Take 1 tablet (25 mg total) by mouth every 8 (eight) hours as needed for nausea or vomiting.   RABEprazole  (ACIPHEX ) 20 MG tablet Take 1 tablet (20 mg total) by mouth daily.   sucralfate  (CARAFATE ) 1 g tablet Take 1 g by mouth at bedtime.   tirzepatide  (ZEPBOUND ) 2.5 MG/0.5ML Pen Inject 2.5 mg into the skin once a week.   tirzepatide  (ZEPBOUND ) 2.5 MG/0.5ML Pen Inject 2.5 mg into the skin once a week.   torsemide  (DEMADEX ) 10 MG tablet Take 10 mg by mouth daily. (Patient taking differently: Take 10 mg by mouth daily. Taking prn)   triamcinolone  cream (KENALOG ) 0.1 % Apply 1 Application topically 2 (two) times daily.   Vonoprazan Fumarate  (VOQUEZNA ) 20 MG TABS Take by mouth.   Current Facility-Administered Medications on File Prior to Visit  Medication   technetium tetrofosmin  (TC-MYOVIEW ) injection 29.8 millicurie  There are no discontinued medications.   Physical Exam:    12/15/2024   11:28 AM 12/15/2024   10:50 AM 11/15/2024   11:16 AM  Vitals with BMI  Height  5' 11 5' 11  Weight  356 lbs 6 oz 358 lbs 13 oz  BMI  49.73 50.06  Systolic 140 168 861  Diastolic 60 68 68  Pulse  88 66  Vital signs reviewed.  Nursing notes reviewed. Weight trend reviewed. Physical Activity: Not on file   General Appearance:  No acute distress appreciable.   Well-groomed, healthy-appearing male.  Well proportioned with no abnormal fat distribution.  Good  muscle tone. Pulmonary:  Normal work of breathing at rest, no respiratory distress apparent. SpO2: 98 %  Musculoskeletal:  All extremities are intact.  Neurological:  Awake, alert, oriented, and engaged.  No obvious focal neurological deficits or cognitive impairments.  Sensorium seems unclouded.   Speech is clear and coherent with logical content. Psychiatric:  Appropriate mood, pleasant and cooperative demeanor, thoughtful and engaged during the exam  Results Labs Thyroid  function panel (03/2024): Within normal limits  Radiology Abdominal and pelvic CT (10/2024): No nephrolithiasis. No malignancy. Limited evaluation due to lack of contrast. No acute hernia complications. Stable splenomegaly attributed to hepatic steatosis. Left diaphragmatic eventration with gastric herniation. Lumbar spondylosis. Aortic atherosclerosis.     11/20/2024    4:11 PM 10/05/2024   11:02 AM 09/01/2024   11:32 AM 08/08/2024   10:59 AM  PHQ 2/9 Scores  PHQ - 2 Score 0 0 0 0    Office Visit on 11/15/2024  Component Date Value Ref Range Status   Color, UA 11/15/2024 brown (A)  yellow Final   Clarity, UA 11/15/2024 hazy (A)  clear Final   Glucose, UA 11/15/2024 negative  negative mg/dL Final   Bilirubin, UA 87/83/7974 negative  negative Final   Ketones, POC UA 11/15/2024 negative  negative mg/dL Final   Spec Grav, UA 87/83/7974 1.025  1.010 - 1.025 Final   Blood, UA 11/15/2024 trace-lysed (A)  negative Final   pH, UA 11/15/2024 6.0  5.0 - 8.0 Final   POC PROTEIN,UA 11/15/2024 =30 (A)  negative, trace Final   Urobilinogen, UA 11/15/2024 0.2  0.2 or 1.0 E.U./dL Final   Nitrite, UA 87/83/7974 Negative  Negative Final   Leukocytes, UA 11/15/2024 Small (1+) (A)  Negative Final   Color, Urine 11/15/2024 DARK YELLOW  YELLOW Final   APPearance 11/15/2024 TURBID (A)  CLEAR Final   Specific Gravity, Urine 11/15/2024 1.018  1.001 - 1.035 Final   pH 11/15/2024 5.5  5.0 - 8.0 Final   Glucose, UA 11/15/2024 NEGATIVE   NEGATIVE Final   Bilirubin Urine 11/15/2024 NEGATIVE  NEGATIVE Final   Ketones, ur 11/15/2024 TRACE (A)  NEGATIVE Final   Hgb urine dipstick 11/15/2024 2+ (A)  NEGATIVE Final   Protein, ur 11/15/2024 3+ (A)  NEGATIVE Final   Nitrites, Initial 11/15/2024 NEGATIVE  NEGATIVE Final   Leukocyte Esterase 11/15/2024 2+ (A)  NEGATIVE Final   WBC, UA 11/15/2024 > OR = 60 (A)  0 - 5 /HPF Final   RBC / HPF 11/15/2024 0-2  0 - 2 /HPF Final   Squamous Epithelial / HPF 11/15/2024 0-5  < OR = 5 /HPF Final   Bacteria, UA 11/15/2024 MANY (A)  NONE SEEN /HPF Final   Hyaline Cast 11/15/2024 0-5 (A)  NONE SEEN /LPF Final   Note 11/15/2024    Final   MICRO NUMBER: 11/15/2024 [id number]   Final   SPECIMEN QUALITY: 11/15/2024 Adequate   Final   Sample Source 11/15/2024 URINE   Final   STATUS: 11/15/2024 FINAL   Final   Result: 11/15/2024    Final                   Value:Mixed genital flora isolated. These superficial bacteria are not indicative of a urinary tract infection. No further organism identification is warranted on this specimen. If clinically indicated, recollect clean-catch, mid-stream urine and transfer  immediately to Urine Culture Transport Tube.    REFLEXIVE URINE CULTURE 11/15/2024    Final  Office Visit on 09/08/2024  Component Date Value Ref Range Status   Color, Urine 09/08/2024 DARK YELLOW  YELLOW Final   APPearance 09/08/2024 CLEAR  CLEAR Final   Specific Gravity, Urine 09/08/2024 1.018  1.001 - 1.035 Final   pH 09/08/2024 6.0  5.0 - 8.0 Final   Glucose, UA 09/08/2024 TRACE (A)  NEGATIVE Final   Bilirubin Urine 09/08/2024 NEGATIVE  NEGATIVE Final   Ketones, ur 09/08/2024 NEGATIVE  NEGATIVE Final   Hgb urine dipstick 09/08/2024 TRACE (A)  NEGATIVE Final   Protein, ur 09/08/2024 2+ (A)  NEGATIVE Final   Nitrites, Initial 09/08/2024 NEGATIVE  NEGATIVE Final   Leukocyte Esterase 09/08/2024 NEGATIVE  NEGATIVE Final   WBC, UA 09/08/2024 0-5  0 - 5 /HPF Final   RBC / HPF 09/08/2024 NONE SEEN   0 - 2 /HPF Final   Squamous Epithelial / HPF 09/08/2024 0-5  < OR = 5 /HPF Final   Bacteria, UA 09/08/2024 NONE SEEN  NONE SEEN /HPF Final   Hyaline Cast 09/08/2024 NONE SEEN  NONE SEEN /LPF Final   Note 09/08/2024    Final   WBC 09/08/2024 4.2  4.0 - 10.5 K/uL Final   RBC 09/08/2024 2.97 (L)  4.22 - 5.81 Mil/uL Final   Hemoglobin 09/08/2024 10.5 (L)  13.0 - 17.0 g/dL Final   HCT 89/90/7974 31.5 (L)  39.0 - 52.0 % Final   MCV 09/08/2024 106.0 (H)  78.0 - 100.0 fl Final   MCHC 09/08/2024 33.5  30.0 - 36.0 g/dL Final   RDW 89/90/7974 16.4 (H)  11.5 - 15.5 % Final   Platelets 09/08/2024 50.0 Repeated and verified X2. (LL)  150.0 - 400.0 K/uL Final   Neutrophils Relative % 09/08/2024 63.6  43.0 - 77.0 % Final   Lymphocytes Relative 09/08/2024 16.8  12.0 - 46.0 % Final   Monocytes Relative 09/08/2024 15.5 (H)  3.0 - 12.0 % Final   Eosinophils Relative 09/08/2024 3.5  0.0 - 5.0 % Final   Basophils Relative 09/08/2024 0.6  0.0 - 3.0 % Final   Neutro Abs 09/08/2024 2.7  1.4 - 7.7 K/uL Final   Lymphs Abs 09/08/2024 0.7  0.7 - 4.0 K/uL Final   Monocytes Absolute 09/08/2024 0.7  0.1 - 1.0 K/uL Final   Eosinophils Absolute 09/08/2024 0.1  0.0 - 0.7 K/uL Final   Basophils Absolute 09/08/2024 0.0  0.0 - 0.1 K/uL Final   Sodium 09/08/2024 138  135 - 145 mEq/L Final   Potassium 09/08/2024 3.2 (L)  3.5 - 5.1 mEq/L Final   Chloride 09/08/2024 95 (L)  96 - 112 mEq/L Final   CO2 09/08/2024 30  19 - 32 mEq/L Final   Glucose, Bld 09/08/2024 135 (H)  70 - 99 mg/dL Final   BUN 89/90/7974 41 (H)  6 - 23 mg/dL Final   Creatinine, Ser 09/08/2024 7.27 (HH)  0.40 - 1.50 mg/dL Final   Total Bilirubin 09/08/2024 3.1 (H)  0.2 - 1.2 mg/dL Final   Alkaline Phosphatase 09/08/2024 297 (H)  39 - 117 U/L Final   AST 09/08/2024 74 (H)  0 - 37 U/L Final   ALT 09/08/2024 53  0 - 53 U/L Final   Total Protein 09/08/2024 6.8  6.0 - 8.3 g/dL Final   Albumin  09/08/2024 3.4 (L)  3.5 - 5.2 g/dL Final   GFR 89/90/7974 8.05 (LL)   >60.00 mL/min Final   Calcium  09/08/2024 9.2  8.4 - 10.5 mg/dL Final   Cholesterol 89/90/7974 195  0 - 200 mg/dL Final   Triglycerides 89/90/7974 238.0 (H)  0.0 - 149.0 mg/dL Final   HDL 89/90/7974 40.40  >39.00 mg/dL Final   VLDL 89/90/7974  47.6 (H)  0.0 - 40.0 mg/dL Final   LDL Cholesterol 09/08/2024 107 (H)  0 - 99 mg/dL Final   Total CHOL/HDL Ratio 09/08/2024 5   Final   NonHDL 09/08/2024 154.64   Final   Hgb A1c MFr Bld 09/08/2024 4.8  4.6 - 6.5 % Final   Microalb, Ur 09/08/2024 45.2 (H)  0.0 - 1.9 mg/dL Final   Creatinine,U 89/90/7974 126.2  mg/dL Final   Microalb Creat Ratio 09/08/2024 357.7 (H)  0.0 - 30.0 mg/g Final   TSH W/REFLEX TO FT4 09/08/2024 2.53  0.40 - 4.50 mIU/L Final   Iron 09/08/2024 110  50 - 180 mcg/dL Final   TIBC 89/90/7974 302  250 - 425 mcg/dL (calc) Final   %SAT 89/90/7974 36  20 - 48 % (calc) Final   Ferritin 09/08/2024 836 (H)  38 - 380 ng/mL Final   Reflexve Urine Culture 09/08/2024    Final  Office Visit on 09/01/2024  Component Date Value Ref Range Status   Color, UA 09/01/2024 dark yellow   Final   Clarity, UA 09/01/2024 cloudy   Final   Glucose, UA 09/01/2024 Positive (A)  Negative Final   Bilirubin, UA 09/01/2024 1 (A)   Final   Ketones, UA 09/01/2024 negative   Final   Spec Grav, UA 09/01/2024 1.025  1.010 - 1.025 Final   Blood, UA 09/01/2024 3 (AA)   Final   pH, UA 09/01/2024 5.5  5.0 - 8.0 Final   Protein, UA 09/01/2024 Negative  Negative Final   Urobilinogen, UA 09/01/2024 0.2  0.2 or 1.0 E.U./dL Final   Nitrite, UA 89/97/7974 negative   Final   Leukocytes, UA 09/01/2024 Small (1+) (A)  Negative Final   MICRO NUMBER: 09/01/2024 [id number]   Final   SPECIMEN QUALITY: 09/01/2024 Adequate   Final   Sample Source 09/01/2024 URINE   Final   STATUS: 09/01/2024 FINAL   Final   Result: 09/01/2024    Final                   Value:Mixed genital flora isolated. These superficial bacteria are not indicative of a urinary tract infection. No further  organism identification is warranted on this specimen. If clinically indicated, recollect clean-catch, mid-stream urine and transfer  immediately to Urine Culture Transport Tube.    WBC, UA 09/01/2024 21-50/hpf (A)  0-2/hpf Final   RBC / HPF 09/01/2024 21-50/hpf (A)  0-2/hpf Final   Squamous Epithelial / HPF 09/01/2024 Rare(0-4/hpf)  Rare(0-4/hpf) Final   Bacteria, UA 09/01/2024 Rare(<10/hpf) (A)  None Final  Office Visit on 08/09/2024  Component Date Value Ref Range Status   Color, Urine 08/09/2024 Dark Yellow (A)  Yellow;Lt. Yellow;Straw;Dark Yellow;Amber;Green;Red;Brown Final   APPearance 08/09/2024 SL CLOUDY (A)  Clear;Turbid;Slightly Cloudy;Cloudy Final   Specific Gravity, Urine 08/09/2024 >=1.030 (A)  1.000 - 1.030 Final   pH 08/09/2024 5.0  5.0 - 8.0 Final   Total Protein, Urine 08/09/2024 100 (A)  Negative Final   Urine Glucose 08/09/2024 100 (A)  Negative Final   Ketones, ur 08/09/2024 TRACE (A)  Negative Final   Bilirubin Urine 08/09/2024 MODERATE (A)  Negative Final   Hgb urine dipstick 08/09/2024 TRACE-LYSED (A)  Negative Final   Urobilinogen, UA 08/09/2024 4.0 (A)  0.0 - 1.0 Final   Leukocytes,Ua 08/09/2024 NEGATIVE  Negative Final   Nitrite 08/09/2024 NEGATIVE  Negative Final   WBC, UA 08/09/2024 3-6/hpf (A)  0-2/hpf Final   RBC / HPF 08/09/2024 0-2/hpf  0-2/hpf Final   Mucus,  UA 08/09/2024 Presence of (A)  None Final   Squamous Epithelial / HPF 08/09/2024 Many(>10/hpf) (A)  Rare(0-4/hpf) Final   Bacteria, UA 08/09/2024 Few(10-50/hpf) (A)  None Final   Granular Casts, UA 08/09/2024 Presence of (A)  None Final   Hyaline Casts, UA 08/09/2024 Presence of (A)  None Final   MICRO NUMBER: 08/09/2024 [id number]   Final   SPECIMEN QUALITY: 08/09/2024 Adequate   Final   Sample Source 08/09/2024 URINE   Final   STATUS: 08/09/2024 FINAL   Final   Result: 08/09/2024 Less than 10,000 CFU/mL of single Gram positive organism isolated. No further testing will be performed. If clinically  indicated, recollection using a method to minimize contamination, with prompt transfer to Urine Culture Transport Tube, is recommended.   Final   WBC 08/09/2024 5.3  4.0 - 10.5 K/uL Final   RBC 08/09/2024 2.76 (L)  4.22 - 5.81 Mil/uL Final   Hemoglobin 08/09/2024 9.9 (L)  13.0 - 17.0 g/dL Final   HCT 90/90/7974 29.2 (L)  39.0 - 52.0 % Final   MCV 08/09/2024 105.9 (H)  78.0 - 100.0 fl Final   MCHC 08/09/2024 33.9  30.0 - 36.0 g/dL Final   RDW 90/90/7974 15.5  11.5 - 15.5 % Final   Platelets 08/09/2024 51.0 (L)  150.0 - 400.0 K/uL Final   Neutrophils Relative % 08/09/2024 51.9  43.0 - 77.0 % Final   Lymphocytes Relative 08/09/2024 18.0  12.0 - 46.0 % Final   Monocytes Relative 08/09/2024 20.8 Repeated and verified X2. (H)  3.0 - 12.0 % Final   Eosinophils Relative 08/09/2024 8.7 (H)  0.0 - 5.0 % Final   Basophils Relative 08/09/2024 0.6  0.0 - 3.0 % Final   Neutro Abs 08/09/2024 2.7  1.4 - 7.7 K/uL Final   Lymphs Abs 08/09/2024 1.0  0.7 - 4.0 K/uL Final   Monocytes Absolute 08/09/2024 1.1 (H)  0.1 - 1.0 K/uL Final   Eosinophils Absolute 08/09/2024 0.5  0.0 - 0.7 K/uL Final   Basophils Absolute 08/09/2024 0.0  0.0 - 0.1 K/uL Final   Sodium 08/09/2024 139  135 - 145 mEq/L Final   Potassium 08/09/2024 3.7  3.5 - 5.1 mEq/L Final   Chloride 08/09/2024 98  96 - 112 mEq/L Final   CO2 08/09/2024 27  19 - 32 mEq/L Final   Glucose, Bld 08/09/2024 93  70 - 99 mg/dL Final   BUN 90/90/7974 50 (H)  6 - 23 mg/dL Final   Creatinine, Ser 08/09/2024 9.15 (HH)  0.40 - 1.50 mg/dL Final   Total Bilirubin 08/09/2024 5.5 (H)  0.2 - 1.2 mg/dL Final   Alkaline Phosphatase 08/09/2024 322 (H)  39 - 117 U/L Final   AST 08/09/2024 62 (H)  0 - 37 U/L Final   ALT 08/09/2024 57 (H)  0 - 53 U/L Final   Total Protein 08/09/2024 6.5  6.0 - 8.3 g/dL Final   Albumin  08/09/2024 3.2 (L)  3.5 - 5.2 g/dL Final   GFR 90/90/7974 6.11 (LL)  >60.00 mL/min Final   Calcium  08/09/2024 9.1  8.4 - 10.5 mg/dL Final  Admission on  90/97/7974, Discharged on 08/06/2024  Component Date Value Ref Range Status   Sodium 08/02/2024 134 (L)  135 - 145 mmol/L Final   Potassium 08/02/2024 2.9 (L)  3.5 - 5.1 mmol/L Final   Chloride 08/02/2024 96 (L)  98 - 111 mmol/L Final   CO2 08/02/2024 22  22 - 32 mmol/L Final   Glucose, Bld 08/02/2024 154 (H)  70 - 99 mg/dL Final   BUN 90/97/7974 53 (H)  6 - 20 mg/dL Final   Creatinine, Ser 08/02/2024 6.94 (H)  0.61 - 1.24 mg/dL Final   Calcium  08/02/2024 10.2  8.9 - 10.3 mg/dL Final   Total Protein 90/97/7974 7.1  6.5 - 8.1 g/dL Final   Albumin  08/02/2024 3.1 (L)  3.5 - 5.0 g/dL Final   AST 90/97/7974 75 (H)  15 - 41 U/L Final   ALT 08/02/2024 56 (H)  0 - 44 U/L Final   Alkaline Phosphatase 08/02/2024 217 (H)  38 - 126 U/L Final   Total Bilirubin 08/02/2024 2.2 (H)  0.0 - 1.2 mg/dL Final   GFR, Estimated 08/02/2024 9 (L)  >60 mL/min Final   Anion gap 08/02/2024 16 (H)  5 - 15 Final   WBC 08/02/2024 9.0  4.0 - 10.5 K/uL Final   RBC 08/02/2024 3.04 (L)  4.22 - 5.81 MIL/uL Final   Hemoglobin 08/02/2024 10.7 (L)  13.0 - 17.0 g/dL Final   HCT 90/97/7974 32.1 (L)  39.0 - 52.0 % Final   MCV 08/02/2024 105.6 (H)  80.0 - 100.0 fL Final   MCH 08/02/2024 35.2 (H)  26.0 - 34.0 pg Final   MCHC 08/02/2024 33.3  30.0 - 36.0 g/dL Final   RDW 90/97/7974 15.1  11.5 - 15.5 % Final   Platelets 08/02/2024 105 (L)  150 - 400 K/uL Final   nRBC 08/02/2024 0.0  0.0 - 0.2 % Final   Color, Urine 08/02/2024 YELLOW  YELLOW Final   APPearance 08/02/2024 HAZY (A)  CLEAR Final   Specific Gravity, Urine 08/02/2024 1.009  1.005 - 1.030 Final   pH 08/02/2024 5.0  5.0 - 8.0 Final   Glucose, UA 08/02/2024 NEGATIVE  NEGATIVE mg/dL Final   Hgb urine dipstick 08/02/2024 MODERATE (A)  NEGATIVE Final   Bilirubin Urine 08/02/2024 NEGATIVE  NEGATIVE Final   Ketones, ur 08/02/2024 NEGATIVE  NEGATIVE mg/dL Final   Protein, ur 90/97/7974 100 (A)  NEGATIVE mg/dL Final   Nitrite 90/97/7974 NEGATIVE  NEGATIVE Final    Leukocytes,Ua 08/02/2024 MODERATE (A)  NEGATIVE Final   RBC / HPF 08/02/2024 0-5  0 - 5 RBC/hpf Final   WBC, UA 08/02/2024 21-50  0 - 5 WBC/hpf Final   Bacteria, UA 08/02/2024 MANY (A)  NONE SEEN Final   Squamous Epithelial / HPF 08/02/2024 0-5  0 - 5 /HPF Final   WBC Clumps 08/02/2024 PRESENT   Final   Ammonia 08/02/2024 151 (H)  9 - 35 umol/L Final   Sodium 08/02/2024 135  135 - 145 mmol/L Final   Potassium 08/02/2024 2.9 (L)  3.5 - 5.1 mmol/L Final   Chloride 08/02/2024 98  98 - 111 mmol/L Final   BUN 08/02/2024 48 (H)  6 - 20 mg/dL Final   Creatinine, Ser 08/02/2024 6.70 (H)  0.61 - 1.24 mg/dL Final   Glucose, Bld 90/97/7974 151 (H)  70 - 99 mg/dL Final   Calcium , Ion 08/02/2024 1.16  1.15 - 1.40 mmol/L Final   TCO2 08/02/2024 22  22 - 32 mmol/L Final   Hemoglobin 08/02/2024 11.2 (L)  13.0 - 17.0 g/dL Final   HCT 90/97/7974 33.0 (L)  39.0 - 52.0 % Final   Opiates 08/02/2024 NONE DETECTED  NONE DETECTED Final   Cocaine 08/02/2024 NONE DETECTED  NONE DETECTED Final   Benzodiazepines 08/02/2024 NONE DETECTED  NONE DETECTED Final   Amphetamines 08/02/2024 NONE DETECTED  NONE DETECTED Final   Tetrahydrocannabinol 08/02/2024 NONE DETECTED  NONE DETECTED Final   Barbiturates 08/02/2024 NONE DETECTED  NONE DETECTED Final   Alcohol, Ethyl (B) 08/02/2024 <15  <15 mg/dL Final   Prothrombin Time 08/02/2024 19.1 (H)  11.4 - 15.2 seconds Final   INR 08/02/2024 1.5 (H)  0.8 - 1.2 Final   Specimen Description 08/03/2024 BLOOD RIGHT ANTECUBITAL   Final   Special Requests 08/03/2024 BOTTLES DRAWN AEROBIC AND ANAEROBIC Blood Culture adequate volume   Final   Culture  Setup Time 08/03/2024    Final                   Value:GRAM POSITIVE RODS ANAEROBIC BOTTLE ONLY    Culture 08/03/2024  (A)   Final                   Value:CUTIBACTERIUM ACNES Standardized susceptibility testing for this organism is not available. Performed at [facility], [address], [place], [place] [address]    Report  Status 08/03/2024 08/08/2024 FINAL   Final   Specimen Description 08/03/2024 BLOOD RIGHT ANTECUBITAL   Final   Special Requests 08/03/2024 BOTTLES DRAWN AEROBIC AND ANAEROBIC Blood Culture adequate volume   Final   Culture 08/03/2024    Final                   Value:NO GROWTH 5 DAYS Performed at [facility], [address], [place], [place] [address]    Report Status 08/03/2024 08/08/2024 FINAL   Final   Sodium 08/03/2024 134 (L)  135 - 145 mmol/L Final   Potassium 08/03/2024 3.1 (L)  3.5 - 5.1 mmol/L Final   Chloride 08/03/2024 95 (L)  98 - 111 mmol/L Final   CO2 08/03/2024 22  22 - 32 mmol/L Final   Glucose, Bld 08/03/2024 150 (H)  70 - 99 mg/dL Final   BUN 90/96/7974 57 (H)  6 - 20 mg/dL Final   Creatinine, Ser 08/03/2024 7.20 (H)  0.61 - 1.24 mg/dL Final   Calcium  08/03/2024 9.9  8.9 - 10.3 mg/dL Final   GFR, Estimated 08/03/2024 9 (L)  >60 mL/min Final   Anion gap 08/03/2024 17 (H)  5 - 15 Final   WBC 08/03/2024 7.7  4.0 - 10.5 K/uL Final   RBC 08/03/2024 2.99 (L)  4.22 - 5.81 MIL/uL Final   Hemoglobin 08/03/2024 10.8 (L)  13.0 - 17.0 g/dL Final   HCT 90/96/7974 31.4 (L)  39.0 - 52.0 % Final   MCV 08/03/2024 105.0 (H)  80.0 - 100.0 fL Final   MCH 08/03/2024 36.1 (H)  26.0 - 34.0 pg Final   MCHC 08/03/2024 34.4  30.0 - 36.0 g/dL Final   RDW 90/96/7974 15.1  11.5 - 15.5 % Final   Platelets 08/03/2024 93 (L)  150 - 400 K/uL Final   nRBC 08/03/2024 0.0  0.0 - 0.2 % Final   Magnesium  08/03/2024 2.3  1.7 - 2.4 mg/dL Final   Phosphorus 90/96/7974 6.8 (H)  2.5 - 4.6 mg/dL Final   Total Protein 90/96/7974 6.8  6.5 - 8.1 g/dL Final   Albumin  08/03/2024 3.0 (L)  3.5 - 5.0 g/dL Final   AST 90/96/7974 76 (H)  15 - 41 U/L Final   ALT 08/03/2024 54 (H)  0 - 44 U/L Final   Alkaline Phosphatase 08/03/2024 203 (H)  38 - 126 U/L Final   Total Bilirubin 08/03/2024 2.2 (H)  0.0 - 1.2 mg/dL Final   Bilirubin, Direct 08/03/2024 1.0 (H)  0.0 - 0.2  mg/dL Final   Indirect Bilirubin 08/03/2024  1.2 (H)  0.3 - 0.9 mg/dL Final   Ammonia 90/96/7974 150 (H)  9 - 35 umol/L Final   Prothrombin Time 08/03/2024 18.5 (H)  11.4 - 15.2 seconds Final   INR 08/03/2024 1.5 (H)  0.8 - 1.2 Final   HIV Screen 4th Generation wRfx 08/03/2024 Non Reactive  Non Reactive Final   Hep B S AB Quant (Post) 08/03/2024 7.0 (L)  Immunity>10 mIU/mL Final   Hepatitis B Surface Ag 08/03/2024 NON REACTIVE  NON REACTIVE Final   Sodium 08/04/2024 135  135 - 145 mmol/L Final   Potassium 08/04/2024 3.2 (L)  3.5 - 5.1 mmol/L Final   Chloride 08/04/2024 97 (L)  98 - 111 mmol/L Final   CO2 08/04/2024 22  22 - 32 mmol/L Final   Glucose, Bld 08/04/2024 167 (H)  70 - 99 mg/dL Final   BUN 90/95/7974 34 (H)  6 - 20 mg/dL Final   Creatinine, Ser 08/04/2024 5.57 (H)  0.61 - 1.24 mg/dL Final   Calcium  08/04/2024 8.9  8.9 - 10.3 mg/dL Final   GFR, Estimated 08/04/2024 12 (L)  >60 mL/min Final   Anion gap 08/04/2024 16 (H)  5 - 15 Final   WBC 08/04/2024 8.8  4.0 - 10.5 K/uL Final   RBC 08/04/2024 2.83 (L)  4.22 - 5.81 MIL/uL Final   Hemoglobin 08/04/2024 10.0 (L)  13.0 - 17.0 g/dL Final   HCT 90/95/7974 30.1 (L)  39.0 - 52.0 % Final   MCV 08/04/2024 106.4 (H)  80.0 - 100.0 fL Final   MCH 08/04/2024 35.3 (H)  26.0 - 34.0 pg Final   MCHC 08/04/2024 33.2  30.0 - 36.0 g/dL Final   RDW 90/95/7974 15.6 (H)  11.5 - 15.5 % Final   Platelets 08/04/2024 122 (L)  150 - 400 K/uL Final   nRBC 08/04/2024 0.0  0.0 - 0.2 % Final   Sodium 08/05/2024 136  135 - 145 mmol/L Final   Potassium 08/05/2024 3.5  3.5 - 5.1 mmol/L Final   Chloride 08/05/2024 98  98 - 111 mmol/L Final   CO2 08/05/2024 21 (L)  22 - 32 mmol/L Final   Glucose, Bld 08/05/2024 115 (H)  70 - 99 mg/dL Final   BUN 90/94/7974 63 (H)  6 - 20 mg/dL Final   Creatinine, Ser 08/05/2024 9.96 (H)  0.61 - 1.24 mg/dL Final   Calcium  08/05/2024 8.3 (L)  8.9 - 10.3 mg/dL Final   Phosphorus 90/94/7974 8.4 (H)  2.5 - 4.6 mg/dL Final   Albumin  08/05/2024 2.6 (L)  3.5 - 5.0 g/dL Final    GFR, Estimated 08/05/2024 6 (L)  >60 mL/min Final   Anion gap 08/05/2024 17 (H)  5 - 15 Final   WBC 08/05/2024 7.0  4.0 - 10.5 K/uL Final   RBC 08/05/2024 2.69 (L)  4.22 - 5.81 MIL/uL Final   Hemoglobin 08/05/2024 9.4 (L)  13.0 - 17.0 g/dL Final   HCT 90/94/7974 29.5 (L)  39.0 - 52.0 % Final   MCV 08/05/2024 109.7 (H)  80.0 - 100.0 fL Final   MCH 08/05/2024 34.9 (H)  26.0 - 34.0 pg Final   MCHC 08/05/2024 31.9  30.0 - 36.0 g/dL Final   RDW 90/94/7974 15.6 (H)  11.5 - 15.5 % Final   Platelets 08/05/2024 79 (L)  150 - 400 K/uL Final   nRBC 08/05/2024 0.0  0.0 - 0.2 % Final  Appointment on 07/19/2024  Component Date Value Ref Range Status  Kappa free light chain 07/19/2024 1,279.1 (H)  3.3 - 19.4 mg/L Final   Lambda free light chains 07/19/2024 45.7 (H)  5.7 - 26.3 mg/L Final   Kappa, lambda light chain ratio 07/19/2024 27.99 (H)  0.26 - 1.65 Final   IgG (Immunoglobin G), Serum 07/19/2024 659  603 - 1,613 mg/dL Final   IgA 91/80/7974 1,064 (H)  90 - 386 mg/dL Final   IgM (Immunoglobulin M), Srm 07/19/2024 83  20 - 172 mg/dL Final   Total Protein ELP 07/19/2024 6.2  6.0 - 8.5 g/dL Corrected   Albumin  SerPl Elph-Mcnc 07/19/2024 3.3  2.9 - 4.4 g/dL Corrected   Alpha 1 91/80/7974 0.3  0.0 - 0.4 g/dL Corrected   Alpha2 Glob SerPl Elph-Mcnc 07/19/2024 0.6  0.4 - 1.0 g/dL Corrected   B-Globulin SerPl Elph-Mcnc 07/19/2024 1.6 (H)  0.7 - 1.3 g/dL Corrected   Gamma Glob SerPl Elph-Mcnc 07/19/2024 0.5  0.4 - 1.8 g/dL Corrected   M Protein SerPl Elph-Mcnc 07/19/2024 0.7 (H)  Not Observed g/dL Corrected   Globulin, Total 07/19/2024 2.9  2.2 - 3.9 g/dL Corrected   Albumin /Glob SerPl 07/19/2024 1.2  0.7 - 1.7 Corrected   IFE 1 07/19/2024 Comment (A)   Corrected   Please Note 07/19/2024 Comment   Corrected   Vitamin B-12 07/19/2024 >7,500 (H)  180 - 914 pg/mL Final   Folate 07/19/2024 20.2  >5.9 ng/mL Final   Iron 07/19/2024 132  45 - 182 ug/dL Final   TIBC 91/80/7974 388  250 - 450 ug/dL Final    Saturation Ratios 07/19/2024 34  17.9 - 39.5 % Final   UIBC 07/19/2024 256  ug/dL Final   Ferritin 91/80/7974 547 (H)  24 - 336 ng/mL Final   Sodium 07/19/2024 139  135 - 145 mmol/L Final   Potassium 07/19/2024 3.5  3.5 - 5.1 mmol/L Final   Chloride 07/19/2024 99  98 - 111 mmol/L Final   CO2 07/19/2024 25  22 - 32 mmol/L Final   Glucose, Bld 07/19/2024 152 (H)  70 - 99 mg/dL Final   BUN 91/80/7974 44 (H)  6 - 20 mg/dL Final   Creatinine 91/80/7974 6.07 (H)  0.61 - 1.24 mg/dL Final   Calcium  07/19/2024 9.7  8.9 - 10.3 mg/dL Final   Total Protein 91/80/7974 6.5  6.5 - 8.1 g/dL Final   Albumin  07/19/2024 3.4 (L)  3.5 - 5.0 g/dL Final   AST 91/80/7974 87 (H)  15 - 41 U/L Final   ALT 07/19/2024 84 (H)  0 - 44 U/L Final   Alkaline Phosphatase 07/19/2024 286 (H)  38 - 126 U/L Final   Total Bilirubin 07/19/2024 2.1 (H)  0.0 - 1.2 mg/dL Final   GFR, Estimated 07/19/2024 10 (L)  >60 mL/min Final   Anion gap 07/19/2024 15  5 - 15 Final   WBC Count 07/19/2024 4.8  4.0 - 10.5 K/uL Final   RBC 07/19/2024 2.65 (L)  4.22 - 5.81 MIL/uL Final   Hemoglobin 07/19/2024 9.6 (L)  13.0 - 17.0 g/dL Final   HCT 91/80/7974 28.8 (L)  39.0 - 52.0 % Final   MCV 07/19/2024 108.7 (H)  80.0 - 100.0 fL Final   MCH 07/19/2024 36.2 (H)  26.0 - 34.0 pg Final   MCHC 07/19/2024 33.3  30.0 - 36.0 g/dL Final   RDW 91/80/7974 15.6 (H)  11.5 - 15.5 % Final   Platelet Count 07/19/2024 58 (L)  150 - 400 K/uL Final   nRBC 07/19/2024 0.0  0.0 -  0.2 % Final   Neutrophils Relative % 07/19/2024 60  % Final   Neutro Abs 07/19/2024 2.9  1.7 - 7.7 K/uL Final   Lymphocytes Relative 07/19/2024 21  % Final   Lymphs Abs 07/19/2024 1.0  0.7 - 4.0 K/uL Final   Monocytes Relative 07/19/2024 13  % Final   Monocytes Absolute 07/19/2024 0.6  0.1 - 1.0 K/uL Final   Eosinophils Relative 07/19/2024 4  % Final   Eosinophils Absolute 07/19/2024 0.2  0.0 - 0.5 K/uL Final   Basophils Relative 07/19/2024 1  % Final   Basophils Absolute 07/19/2024  0.0  0.0 - 0.1 K/uL Final   Immature Granulocytes 07/19/2024 1  % Final   Abs Immature Granulocytes 07/19/2024 0.05  0.00 - 0.07 K/uL Final  Office Visit on 06/16/2024  Component Date Value Ref Range Status   Color, Urine 06/16/2024 DARK YELLOW  YELLOW Final   APPearance 06/16/2024 TURBID (A)  CLEAR Final   Specific Gravity, Urine 06/16/2024 1.019  1.001 - 1.035 Final   pH 06/16/2024 5.5  5.0 - 8.0 Final   Glucose, UA 06/16/2024 NEGATIVE  NEGATIVE Final   Bilirubin Urine 06/16/2024 NEGATIVE  NEGATIVE Final   Ketones, ur 06/16/2024 TRACE (A)  NEGATIVE Final   Hgb urine dipstick 06/16/2024 TRACE (A)  NEGATIVE Final   Protein, ur 06/16/2024 2+ (A)  NEGATIVE Final   Nitrites, Initial 06/16/2024 NEGATIVE  NEGATIVE Final   Leukocyte Esterase 06/16/2024 3+ (A)  NEGATIVE Final   WBC, UA 06/16/2024 > OR = 60 (A)  0 - 5 /HPF Final   RBC / HPF 06/16/2024 NONE SEEN  0 - 2 /HPF Final   Squamous Epithelial / HPF 06/16/2024 0-5  < OR = 5 /HPF Final   Bacteria, UA 06/16/2024 MANY (A)  NONE SEEN /HPF Final   Hyaline Cast 06/16/2024 NONE SEEN  NONE SEEN /LPF Final   Note 06/16/2024    Final   MICRO NUMBER: 06/16/2024 [id number]   Final   SPECIMEN QUALITY: 06/16/2024 Adequate   Final   Sample Source 06/16/2024 URINE   Final   STATUS: 06/16/2024 FINAL   Final   ISOLATE 1: 06/16/2024 Klebsiella pneumoniae (A)   Final   REFLEXIVE URINE CULTURE 06/16/2024    Final  Admission on 05/06/2024, Discharged on 05/06/2024  Component Date Value Ref Range Status   Sodium 05/06/2024 135  135 - 145 mmol/L Final   Potassium 05/06/2024 3.7  3.5 - 5.1 mmol/L Final   Chloride 05/06/2024 101  98 - 111 mmol/L Final   BUN 05/06/2024 38 (H)  6 - 20 mg/dL Final   Creatinine, Ser 05/06/2024 7.50 (H)  0.61 - 1.24 mg/dL Final   Glucose, Bld 93/93/7974 166 (H)  70 - 99 mg/dL Final   Calcium , Ion 05/06/2024 1.02 (L)  1.15 - 1.40 mmol/L Final   TCO2 05/06/2024 23  22 - 32 mmol/L Final   Hemoglobin 05/06/2024 11.2 (L)  13.0 - 17.0  g/dL Final   HCT 93/93/7974 33.0 (L)  39.0 - 52.0 % Final  Scanned Document on 03/24/2024  Component Date Value Ref Range Status   Creatinine, POC 05/30/2023 95.2  mg/dL Final   EGFR 93/70/7975 18.0   Final  Office Visit on 03/14/2024  Component Date Value Ref Range Status   Cholesterol 03/14/2024 208 (H)  0 - 200 mg/dL Final   Triglycerides 95/85/7974 167.0 (H)  0.0 - 149.0 mg/dL Final   HDL 95/85/7974 50.60  >39.00 mg/dL Final   VLDL 95/85/7974 33.4  0.0 - 40.0 mg/dL Final   LDL Cholesterol 03/14/2024 124 (H)  0 - 99 mg/dL Final   Total CHOL/HDL Ratio 03/14/2024 4   Final   NonHDL 03/14/2024 157.38   Final   Sodium 03/14/2024 137  135 - 145 mEq/L Final   Potassium 03/14/2024 4.3  3.5 - 5.1 mEq/L Final   Chloride 03/14/2024 98  96 - 112 mEq/L Final   CO2 03/14/2024 22  19 - 32 mEq/L Final   Glucose, Bld 03/14/2024 152 (H)  70 - 99 mg/dL Final   BUN 95/85/7974 69 (H)  6 - 23 mg/dL Final   Creatinine, Ser 03/14/2024 10.96 (HH)  0.40 - 1.50 mg/dL Final   Total Bilirubin 03/14/2024 1.6 (H)  0.2 - 1.2 mg/dL Final   Alkaline Phosphatase 03/14/2024 244 (H)  39 - 117 U/L Final   AST 03/14/2024 55 (H)  0 - 37 U/L Final   ALT 03/14/2024 64 (H)  0 - 53 U/L Final   Total Protein 03/14/2024 6.1  6.0 - 8.3 g/dL Final   Albumin  03/14/2024 3.3 (L)  3.5 - 5.2 g/dL Final   GFR 95/85/7974 4.94 (LL)  >60.00 mL/min Final   Calcium  03/14/2024 9.2  8.4 - 10.5 mg/dL Final   WBC 95/85/7974 7.8  4.0 - 10.5 K/uL Final   RBC 03/14/2024 3.07 (L)  4.22 - 5.81 Mil/uL Final   Hemoglobin 03/14/2024 11.0 (L)  13.0 - 17.0 g/dL Final   HCT 95/85/7974 32.0 (L)  39.0 - 52.0 % Final   MCV 03/14/2024 104.2 (H)  78.0 - 100.0 fl Final   MCHC 03/14/2024 34.4  30.0 - 36.0 g/dL Final   RDW 95/85/7974 15.9 (H)  11.5 - 15.5 % Final   Platelets 03/14/2024 89.0 (L)  150.0 - 400.0 K/uL Final   Neutrophils Relative % 03/14/2024 59.4  43.0 - 77.0 % Final   Lymphocytes Relative 03/14/2024 17.6  12.0 - 46.0 % Final   Monocytes  Relative 03/14/2024 16.0 (H)  3.0 - 12.0 % Final   Eosinophils Relative 03/14/2024 6.6 (H)  0.0 - 5.0 % Final   Basophils Relative 03/14/2024 0.4  0.0 - 3.0 % Final   Neutro Abs 03/14/2024 4.6  1.4 - 7.7 K/uL Final   Lymphs Abs 03/14/2024 1.4  0.7 - 4.0 K/uL Final   Monocytes Absolute 03/14/2024 1.2 (H)  0.1 - 1.0 K/uL Final   Eosinophils Absolute 03/14/2024 0.5  0.0 - 0.7 K/uL Final   Basophils Absolute 03/14/2024 0.0  0.0 - 0.1 K/uL Final   TSH 03/14/2024 2.070  0.450 - 4.500 uIU/mL Final   Hgb A1c MFr Bld 03/14/2024 6.1  4.6 - 6.5 % Final   Magnesium  03/14/2024 2.4  1.5 - 2.5 mg/dL Final   Phosphorus 95/85/7974 8.1 (H)  2.3 - 4.6 mg/dL Final  There may be more visits with results that are not included.  No image results found. CT ABDOMEN PELVIS WO CONTRAST Result Date: 11/28/2024 CLINICAL DATA:  Generalized abdominal pain. EXAM: CT ABDOMEN AND PELVIS WITHOUT CONTRAST TECHNIQUE: Multidetector CT imaging of the abdomen and pelvis was performed following the standard protocol without IV contrast. RADIATION DOSE REDUCTION: This exam was performed according to the departmental dose-optimization program which includes automated exposure control, adjustment of the mA and/or kV according to patient size and/or use of iterative reconstruction technique. COMPARISON:  CT scan 11/02/2023 FINDINGS: Lower chest: Moderate eventration of the left hemidiaphragm with some overlying vascular crowding and atelectasis. No pleural or pericardial effusion. Coronary artery calcifications are  noted. Moderate bilateral gynecomastia. Hepatobiliary: No hepatic lesions are identified without contrast. No intrahepatic biliary dilatation. The gallbladder is surgically absent. No common bile duct dilatation. Pancreas: No mass, inflammation or ductal dilatation. Spleen: Mild splenomegaly, stable. Adrenals/Urinary Tract: The adrenal glands are unremarkable. The kidneys are small but no renal lesions, renal calculi or  hydronephrosis. The bladder is moderately contracted but no obvious abnormality. Stomach/Bowel: The stomach is distended with fluid, food and gas. No mass or inflammatory process. The small bowel and colon are unremarkable. No mass lesions or obstructive findings. No inflammatory changes. Moderate amount of retained appearing barium in the versus ingested material such as Pepto-Bismol or calcium  tablets. Vascular/Lymphatic: Scattered age advanced vascular calcifications but no aneurysm. No mesenteric or retroperitoneal mass or adenopathy. Reproductive: The prostate gland and seminal vesicles are unremarkable. Other: Stable periumbilical abdominal wall hernia containing fat and a chronic encapsulated fluid collection. No abdominal/pelvic ascites or free air. Musculoskeletal: No significant bony findings. Bilateral pars defects are noted at L4 with a grade 1 spondylolisthesis and there is advanced degenerative disc disease and facet disease at L4-5. IMPRESSION: 1. No acute abdominal/pelvic findings, mass lesions or adenopathy. 2. Stable periumbilical abdominal wall hernia containing fat and a chronic encapsulated fluid collection. 3. Stable mild splenomegaly. 4. Stable eventration of the left hemidiaphragm with overlying vascular crowding and atelectasis. 5. Bilateral pars defects at L4 with a grade 1 spondylolisthesis and advanced degenerative disc disease and facet disease at L4-5. 6. Aortic atherosclerosis. Aortic Atherosclerosis (ICD10-I70.0). Electronically Signed   By: MYRTIS Stammer M.D.   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 A dermatology referral is necessary for further management. He is referred to a dermatologist for evaluation and management of pruritus and xerosis. Inadequate  material resources Insurance coverage problems Unable to obtain health insurance coverage due to limited financial resources Material and insurance hardship affecting care   He faces significant financial hardship due to increased insurance costs. Efforts are underway to switch to Medicare and utilize [employer] assistance for deductibles. Social worker involvement is necessary to address financial issues. Efforts to switch to Medicare and utilize [employer] assistance will continue. A social worker is involved to assist with financial issues and insurance navigation. Dependence on renal dialysis End-stage renal disease on dialysis   His end-stage renal disease is managed with dialysis. Blood pressure is monitored by nephrology. Potassium levels fluctuate with dialysis, and a high-protein diet is encouraged to address low protein levels. Post-dialysis cramps are a concern, and adjustments to dialysis may be needed. Dialysis will continue as scheduled. Blood pressure will be monitored with the nephrology team. High-potassium foods and a high-protein diet are encouraged as advised by nephrology. Post-dialysis cramps are discussed with the dialysis center for potential adjustments. Chronic maxillary sinusitis He has experienced nasal drainage and epistaxis for 2-3 weeks. Augmentin is allergyu. Cefazolin  IV is preferred but requires administration at the dialysis center. I was able to verbal order(s) cefazolin  after next dialysis.  Doxycycline  is an alternative but not ideal due to his hiatal hernia. Cefazolin  1 gram IV was administered at the end of the dialysis session. A Z-Pak (azithromycin ) was provided as an alternative if cefazolin  is not administered. Gastroesophageal reflux disease with esophagitis without hemorrhage Hiatal hernia with GERD  and esophagitis Hiatal hernia with gastroesophageal reflux disease and esophagitis   His hiatal hernia with GERD and esophagitis  symptoms have improved with Voquezna , though insurance issues affect medication access. Weight loss is recommended to reduce pressure on the diaphragm and esophagus. Surgical intervention is discussed as a potential option to address the hiatal hernia and facilitate weight loss. Voquezna  is prescribed. He is referred to a bariatric surgeon for evaluation of surgical weight loss options and advised to avoid spicy foods to prevent symptom exacerbation. Metabolic dysfunction-associated steatohepatitis (MASH)  Obesity with fatty liver disease   His obesity is associated with fatty liver disease. Weight loss is crucial to improve overall health and reduce pressure on the diaphragm. Surgical weight loss is considered as a potential option to facilitate weight loss and address the hiatal hernia. He is referred to a bariatric surgeon for evaluation of surgical weight loss options, and the potential benefits of weight loss on overall health and the hiatal hernia are discussed. Atherosclerosis of aorta Aortic atherosclerosis and lumbar spondylosis   He has aortic atherosclerosis and lumbar spondylosis. Eliquis  is used for anticoagulation. Weight loss is recommended to reduce cardiovascular risk. Eliquis  will be continued for anticoagulation, and weight loss is encouraged to reduce cardiovascular risk.  Recording duration: 38 minutes  ORDER ASSOCIATIONS  #   DIAGNOSIS / CONDITION ICD-10 ENCOUNTER ORDER     ICD-10-CM   1. Xerosis of skin  L85.3 Ambulatory referral to Dermatology    2. Insurance coverage problems  Z59.71 AMB Referral [facility]    3. Unable to obtain health insurance coverage due to limited financial resources  Z59.71 AMB Referral [facility]   Z59.87     4. Inadequate material resources  Z59.87     5. Dependence on renal dialysis  Z99.2     6. Chronic maxillary sinusitis  J32.0 azithromycin  (ZITHROMAX ) 250 MG tablet    7. Gastroesophageal reflux disease with  esophagitis without hemorrhage  K21.00 Vonoprazan Fumarate  (VOQUEZNA ) 20 MG TABS    8. Hiatal hernia with GERD and esophagitis  K44.9    K21.00     9. Metabolic dysfunction-associated steatohepatitis (MASH)  K75.81     10. Atherosclerosis of aorta  I70.0          Orders Placed in Encounter:    Meds ordered this encounter  Medications   Vonoprazan Fumarate  (VOQUEZNA ) 20 MG TABS    Sig: Take 1 tablet by mouth daily at 6 (six) AM.    Dispense:  90 tablet    Refill:  4   azithromycin  (ZITHROMAX ) 250 MG tablet    Sig: Take 2 tablets by mouth on day 1, then 1 tablet daily on days 2 through 5.    Dispense:  6 tablet    Refill:  0    ED Discharge Orders          Ordered    Ambulatory referral to Dermatology       Comments: Dermatology Referral for Chronic Kidney Disease-Associated Pruritus  I am referring this patient for dermatology evaluation and management of chronic pruritus with lichenified skin changes that developed after initiating dialysis.  Clinical History:  This patient has end-stage renal disease requiring hemodialysis and has developed severe, chronic pruritus with associated lichenification since starting dialysis. The pruritus is significantly impacting quality of life and sleep. The patient has been treated with aggressive emollient therapy and fragrance-free moisturizers with limited improvement.  Current Management:  Dialysis adequacy has been optimized, and metabolic parameters  including calcium , phosphorus, and parathyroid hormone levels are being actively managed. The patient is currently using high-water-content emollients regularly. [Adjust based on actual treatments attempted - consider adding: gabapentin/pregabalin dosing, topical analgesics, antihistamines if tried]  Reason for Referral:  I am seeking your expertise for:  1. Evaluation of the lichenified skin changes to exclude alternative or coexisting dermatologic conditions, including acquired  perforating disorder, eczematous dermatoses, or other inflammatory skin conditions that may occur in patients with chronic kidney disease[1][2]  2. Consideration of skin biopsy if clinically indicated to rule out primary dermatologic causes of pruritus[2][3]  3. Assessment for UV-B phototherapy, which has demonstrated efficacy in approximately 80% of patients with chronic kidney disease-associated pruritus when administered three times weekly[2]  4. Guidance on advanced topical therapies and consideration of systemic treatments beyond first-line options if phototherapy is not feasible or effective  The patient would benefit from your specialized assessment and recommendations for managing this refractory chronic kidney disease-associated pruritus.  Thank you for your assistance with this patient's care. Please contact me if you need any additional information. References 1. Skin Disorders in Kidney Disease: Core Curriculum 2026. Glennon CM, Nigwekar SU, Kroshinsky D, Tucker JK. American Journal of Kidney Diseases : The Official Journal of the [employer]. 2025;:S0272-6386(25)01075-3. doi:10.1053/j.ajkd.2025.03.031. 2. Chronic Pruritus: A Review. Towana GUTHRIE, Joyce ONEIDA Erik GORMAN, et al. JAMA. 2024;331(24):2114-2124. doi:10.1001/jama.7975.5100. 3. Pruritus in the Older Patient: A Clinical Review. Joyce TG, Shive M, Harper GM. JAMA. 2013;310(22):2443-50. doi:10.1001/jama.7986.717976.   12/15/24 1136    AMB Referral [facility] Care Management        12/15/24 1136    Vonoprazan Fumarate  (VOQUEZNA ) 20 MG TABS  Daily        12/15/24 1136    azithromycin  (ZITHROMAX ) 250 MG tablet        12/15/24 1136            Medical Decision Making: 2 or more stable chronic illnesses Prescription drug management  Diagnosis or treatment significantly limited by social determinants of health      This document was synthesized by artificial intelligence (Abridge) using HIPAA-compliant recording of  the clinical interaction;   We discussed the use of AI scribe software for clinical note transcription with the patient, who gave verbal consent to proceed. additional Info: This encounter employed state-of-the-art, real-time, collaborative documentation. The patient actively reviewed and assisted in updating their electronic medical record on a shared screen, ensuring transparency and facilitating joint problem-solving for the problem list, overview, and plan. This approach promotes accurate, informed care. The treatment plan was discussed and reviewed in detail, including medication safety, potential side effects, and all patient questions. We confirmed understanding and comfort with the plan. Follow-up instructions were established, including contacting the office for any concerns, returning if symptoms worsen, persist, or new symptoms develop, and precautions for potential emergency department visits.

## 2024-12-16 ENCOUNTER — Telehealth: Payer: Self-pay | Admitting: *Deleted

## 2024-12-16 ENCOUNTER — Other Ambulatory Visit: Payer: Self-pay | Admitting: Licensed Clinical Social Worker

## 2024-12-16 NOTE — Patient Outreach (Incomplete)
 Complex Care Management   Visit Note  12/16/2024  Name:  Sean [name] MRN: [id number] DOB: [birth year]  Situation: Referral received for Complex Care Management related to Mental/Behavioral Health diagnosis stress at financial barriers. I obtained verbal consent from Patient.  Visit completed with Patient  on the phone  Background:   Past Medical History:  Diagnosis Date   Abnormal liver enzymes    a. Sees a doctor in [place].   Acute hepatic encephalopathy (HCC) 08/03/2024   Acute renal failure superimposed on stage 5 chronic kidney disease, not on chronic dialysis (HCC) 01/08/2024   AKI (acute kidney injury) 06/17/2023   Atrial fibrillation (HCC)    Cardiac cirrhosis    a. possible elevated LFTs/low platelets felt due to cardiac cirrhosis per 2017 admission.   Chronic combined systolic and diastolic CHF (congestive heart failure) (HCC)    Chronic kidney disease, stage 4 (severe) (HCC) 01/04/2016   Lab Results      Component    Value    Date/Time           GFR    29.24 (L)    09/30/2022 02:51 PM           GFR    27.27 (L)    09/24/2022 03:40 PM           GFR    24.10 (L)    09/03/2022 03:45 PM           GFR    18.98 (L)    08/25/2022 12:21 PM           GFR    38.98 (L)    04/24/2022 02:39 PM   Seeing [facility]: Sean Katheryn JAYSON, MD  Lastt plan 04/2023: # CKD stage IV    CKD (chronic kidney disease) stage 3, GFR 30-59 ml/min (HCC) 01/04/2016   Lab Results  Component  Value  Date/Time     GFR  29.24 (L)  09/30/2022 02:51 PM     GFR  27.27 (L)  09/24/2022 03:40 PM     GFR  24.10 (L)  09/03/2022 03:45 PM     GFR  18.98 (L)  08/25/2022 12:21 PM     GFR  38.98 (L)  04/24/2022 02:39 PM         CKD (chronic kidney disease), stage II    Stage 4   Congenital heart defect    a. rightward rotation of heart, almost dextrocardia   Coronary artery disease    a. s/p CABGx1 in 12/2015.   Dyspnea    with exertion   Gastric polyps 11/25/2022   Gastritis and gastroduodenitis  11/25/2022          Gastroesophageal reflux disease with esophagitis and hemorrhage 04/04/2021   Hematuria    a. Chronic hx of this, no prior etiology determined through workup per patient.   History of umbilical hernia repair 07/10/2011   History of hernia surgery twice prior   Hypercholesteremia    a. Prev taken off statin due to abnormal liver function.   Hypertension    Incisional hernia 07/10/2011   History of hernia surgery twice prior   Morbid obesity (HCC)    Morbid obesity with BMI of 50.0-59.9, adult (HCC) 01/04/2016   After [facility] doctor is setting him up with a wellness clinic to help with weight losS  He reports not having tried anything for weight loss either diet or or medicine or surgery in the past  We failed to get  Wegovy  10/2022      Wt Readings from Last 10 Encounters:  11/13/22  (!) 384 lb 12.8 oz (174.5 kg)  10/13/22  (!) 385 lb (174.6 kg)  09/30/22  (!) 384 lb (174.2 kg)  09/24/22  (!) 382 lb (1   Nausea and vomiting 11/25/2022   OSA on CPAP    Paroxysmal atrial flutter (HCC) 02/12/2016   PONV (postoperative nausea and vomiting)    Rectal bleeding 11/03/2023   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   LIMA to LAD   Sinus bradycardia    Thrombocytopenia    UTI (urinary tract infection) 08/03/2024    Assessment: Patient Reported Symptoms:  Cognitive Cognitive Status: Able to follow simple commands, Alert and oriented to person, place, and time Cognitive/Intellectual Conditions Management [RPT]: None reported or documented in medical history or problem list      Neurological Neurological Review of Symptoms: No symptoms reported    HEENT HEENT Symptoms Reported: No symptoms reported      Cardiovascular Cardiovascular Symptoms Reported: No symptoms reported Does patient have uncontrolled Hypertension?: Yes Is patient checking Blood Pressure at home?: Yes Cardiovascular Management Strategies: Routine screening Cardiovascular Self-Management Outcome: 4 (good)   Respiratory Respiratory Symptoms Reported: No symptoms reported    Endocrine Endocrine Symptoms Reported: Not assessed    Gastrointestinal Gastrointestinal Symptoms Reported: Not assessed      Genitourinary Genitourinary Symptoms Reported: Not assessed    Integumentary Integumentary Symptoms Reported: Not assessed    Musculoskeletal Musculoskelatal Symptoms Reviewed: No symptoms reported        Psychosocial Psychosocial Symptoms Reported: No symptoms reported Behavioral Management Strategies: Coping strategies, Support system Behavioral Health Self-Management Outcome: 4 (good) Major Change/Loss/Stressor/Fears (CP): Denies      12/19/2024    PHQ2-9 Depression Screening   Little interest or pleasure in doing things Not at all  Feeling down, depressed, or hopeless Not at all  PHQ-2 - Total Score 0  Trouble falling or staying asleep, or sleeping too much    Feeling tired or having little energy    Poor appetite or overeating     Feeling bad about yourself - or that you are a failure or have let yourself or your family down    Trouble concentrating on things, such as reading the newspaper or watching television    Moving or speaking so slowly that other people could have noticed.  Or the opposite - being so fidgety or restless that you have been moving around a lot more than usual    Thoughts that you would be better off dead, or hurting yourself in some way    PHQ2-9 Total Score    If you checked off any problems, how difficult have these problems made it for you to do your work, take care of things at home, or get along with other people    Depression Interventions/Treatment      There were no vitals filed for this visit.    Medications Reviewed Today     Reviewed by Merlynn Lyle CROME, LCSW (Social Worker) on 12/19/24 at [phone]  Med List Status: <None>   Medication Order Taking? Sig Documenting Provider Last Dose Status Informant  albuterol  (VENTOLIN  HFA) 108 (90 Base)  MCG/ACT inhaler [id number]  Inhale 2 puffs into the lungs every 4 (four) hours as needed. [provider]  Active   allopurinol  (ZYLOPRIM ) 100 MG tablet [id number]  TAKE 1 TABLET BY MOUTH EVERY DAY Jesus Bernardino MATSU, MD  Active Self, Mother, Pharmacy Records  amitriptyline  (ELAVIL ) 25 MG tablet [id number]  TAKE 1 TABLET BY MOUTH EVERYDAY AT BEDTIME May, Deanna J, NP  Active   azelastine  (ASTELIN ) 0.1 % nasal spray [id number]  Place 2 sprays into both nostrils 2 (two) times daily. Use in each nostril as directed Tobie Eldora NOVAK, MD  Active Self, Mother, Pharmacy Records  azithromycin  (ZITHROMAX ) 250 MG tablet [id number]  Take 2 tablets by mouth on day 1, then 1 tablet daily on days 2 through 5. Jesus Bernardino MATSU, MD  Active   B Complex-C-Folic Acid  (DIALYVITE  TABLET) TABS [id number]  Take 1 tablet by mouth daily. [provider]  Active Self, Mother, Pharmacy Records  Cyanocobalamin  (VITAMIN B-12) 5000 MCG SUBL [id number]  Place 5,000 mcg under the tongue daily. [provider]  Active Self, Mother, Pharmacy Records  diltiazem  (CARDIZEM  SR) 120 MG 12 hr capsule [id number]  Take 1 capsule (120 mg total) by mouth 2 (two) times daily. Job [place], [place]  Active   ELIQUIS  5 MG TABS tablet [id number]  TAKE 1 TABLET BY MOUTH TWICE A DAY Chandrasekhar, Mahesh A, MD  Active   Evolocumab  (REPATHA ) 140 MG/ML SOSY [id number]  Inject 140 mg into the skin every 14 (fourteen) days. [provider]  Active Self, Mother, Pharmacy Records           Med Note EVERETTE JESUSA BROCKS   Dju Aug 06, 2024 11:34 AM) Next dose is due 9.14.2025  folic acid -vitamin b complex-vitamin c-selenium-zinc (DIALYVITE ) 3 MG TABS tablet [id number]  Take 1 tablet by mouth daily. [provider]  Active Self, Mother, Pharmacy Records  hydrocortisone  (ANUSOL -HC) 25 MG suppository [id number]  Place 1 suppository (25 mg total) rectally 2 (two) times daily. Jesus Bernardino MATSU, MD  Active   lactulose  (CHRONULAC ) 10 GM/15ML  solution [id number]  TAKE 45 MLS 4 TIMES DAILY AS NEEDED (TO ACHEIVE GOAL OF 2-3 BMS DAILY). TITRATE BASED ON THE STOOL FREQUENCY. Jesus Bernardino MATSU, MD  Active   lactulose , encephalopathy, (CHRONULAC ) 10 GM/15ML SOLN [id number]  Take 30 mLs by mouth in the morning and at bedtime. [provider]  Active   lanthanum  (FOSRENOL ) 1000 MG chewable tablet [id number]  Chew 1,000 mg by mouth 3 (three) times daily with meals. [provider]  Active Self, Mother, Pharmacy Records           Med Note EVERETTE JESUSA BROCKS   Dju Aug 06, 2024 11:35 AM) Patient takes 2 capsules 3 times daily  losartan  (COZAAR ) 50 MG tablet [id number]  Take 1 tablet (50 mg total) by mouth daily.  Patient not taking: Reported on 09/08/2024   Job [place], [place]  Active   Methoxy PEG-Epoetin Beta (MIRCERA IJ) [id number]  75 mcg. [provider]  Active Self, Mother, Pharmacy Records           Med Note EVERETTE JESUSA BROCKS   Dju Aug 06, 2024 11:35 AM) At dialysis  montelukast  (SINGULAIR ) 10 MG tablet [id number]  TAKE 1 TABLET BY MOUTH EVERY DAY Jesus Bernardino MATSU, MD  Active   mupirocin  ointment (BACTROBAN ) 2 % [id number]  Apply to inner nares twice daily [place], [place], [place]  Active   neomycin -polymyxin-hydrocortisone  (CORTISPORIN) 3.5-10000-1 OTIC suspension [id number]  Apply 1-2 drops daily after soaking and cover with bandaid Silva Juliene SAUNDERS, DPM  Active   nitroGLYCERIN  (NITROSTAT ) 0.4 MG SL tablet [id number]  Place 1 tablet (0.4 mg total) under the tongue every 5 (five) minutes as needed for chest pain. Santo Stanly LABOR, MD  Active   ondansetron  (  ZOFRAN -ODT) 4 MG disintegrating tablet [id number]  Take 1 tablet (4 mg total) by mouth every 8 (eight) hours as needed for nausea or vomiting. Jesus Bernardino MATSU, MD  Active Self, Mother, Pharmacy Records  oxyCODONE -acetaminophen  (PERCOCET) 5-325 MG tablet [id number]  Take 1 tablet by mouth every 6 (six) hours as needed for severe pain (pain score 7-10). Rhyne, Samantha J,  PA-C  Active Self, Mother, Pharmacy Records  pantoprazole  (PROTONIX ) 40 MG tablet [id number]  Take 2 tablets (80 mg total) by mouth daily. Jesus Bernardino MATSU, MD  Active   polyethylene glycol (MIRALAX  / GLYCOLAX ) 17 g packet [id number]  Take 17 g by mouth daily as needed. Titrate based on the stool frequency. Goal :2-3 BMs a day, [place], [place], [place]  Active   promethazine  (PHENERGAN ) 25 MG tablet [id number]  Take 1 tablet (25 mg total) by mouth every 8 (eight) hours as needed for nausea or vomiting. Jesus Bernardino MATSU, MD  Active Self, Mother, Pharmacy Records  RABEprazole  (ACIPHEX ) 20 MG tablet [id number]  Take 1 tablet (20 mg total) by mouth daily. Jesus Bernardino MATSU, MD  Active   sucralfate  (CARAFATE ) 1 g tablet [id number]  Take 1 g by mouth at bedtime. [provider]  Active   technetium tetrofosmin  (TC-MYOVIEW ) injection 29.8 millicurie [id number]   Mona Vinie BROCKS, MD  Active   tirzepatide  (ZEPBOUND ) 2.5 MG/0.5ML Pen [id number]  Inject 2.5 mg into the skin once a week. Jesus Bernardino MATSU, MD  Active   tirzepatide  (ZEPBOUND ) 2.5 MG/0.5ML Pen [id number]  Inject 2.5 mg into the skin once a week. Jesus Bernardino MATSU, MD  Active   torsemide  (DEMADEX ) 10 MG tablet [id number]  Take 10 mg by mouth daily.  Patient taking differently: Take 10 mg by mouth daily. Taking prn   [provider]  Active Self, Mother, Pharmacy Records           Med Note EVERETTE JESUSA BROCKS   Dju Aug 06, 2024 11:38 AM) Takes as needed  triamcinolone  cream (KENALOG ) 0.1 % [id number]  Apply 1 Application topically 2 (two) times daily. Jesus Bernardino MATSU, MD  Active   Vonoprazan Fumarate  (VOQUEZNA ) 20 MG TABS [id number]  Take by mouth. [provider]  Active Self, Mother, Pharmacy Records           Med Note EVERETTE JESUSA BROCKS   Dju Aug 06, 2024 11:38 AM) This is a sample medication the patient received from Dr Jesus  Vonoprazan Fumarate  (VOQUEZNA ) 20 MG TABS [id number]  Take 1 tablet by mouth daily at 6 (six) AM. Jesus Bernardino MATSU, MD  Active   Med List Note Christie Alyson Sola 05/04/24 9063): Dialysis Tue., Thurs.,& Sat.            Recommendation:   PCP Follow-up Continue Current Plan of Care  Follow Up Plan:   Referral to Pharmacist BSW  Lyle Rung, BSW, MSW, LCSW Licensed Clinical Social Worker [place]   [email or web address] Direct Dial : [phone]

## 2024-12-16 NOTE — Progress Notes (Signed)
 Care Guide Pharmacy Note  12/16/2024 Name: Sean [name] MRN: [id number] DOB: 1971/08/25  Referred By: Jesus Bernardino MATSU, MD Reason for referral: Call Attempt #1 and Complex Care Management (Outreach to schedule referral with pharmacist )   Sean [name] is a 54 y.o. year old male who is a primary care patient of Jesus Bernardino MATSU, MD.  Sean [name] was referred to the pharmacist for assistance related to: HTN  Successful contact was made with the patient to discuss pharmacy services including being ready for the pharmacist to call at least 5 minutes before the scheduled appointment time and to have medication bottles and any blood pressure readings ready for review. The patient agreed to meet with the pharmacist via telephone visit on 12/22/2024  LCSW reached out during her visit with pt today and pt agreeable to 1/22 pharm appt  Thedford Franks, CMA, AAMA [place]  [facility], [facility] Guide, Lead Direct Dial : [phone]  Fax: [phone]

## 2024-12-16 NOTE — Progress Notes (Signed)
 Care Guide Pharmacy Note  12/16/2024 Name: Sean [name] MRN: [id number] DOB: [birth year]  Referred By: Jesus Bernardino MATSU, MD Reason for referral: Call Attempt #1 and Complex Care Management (Outreach to schedule referral with pharmacist )   Sean [name] is a 54 y.o. year old male who is a primary care patient of Jesus Bernardino MATSU, MD.  Sean [name] was referred to the pharmacist for assistance related to: HTN  An unsuccessful telephone outreach was attempted today to contact the patient who was referred to the pharmacy team for assistance with medication assistance. Additional attempts will be made to contact the patient.  Thedford Franks, CMA, AAMA [place]  [facility], [facility] Guide, Lead Direct Dial : [phone]  Fax: [phone]

## 2024-12-19 NOTE — Patient Instructions (Signed)
 Visit Information  Thank you for taking time to visit with me today. Please don't hesitate to contact me if I can be of assistance to you before our next scheduled appointment.  Your next care management appointment is by telephone on 12/22/24 at 3  Telephone follow-up in 1 month  Please call the care guide team at [phone] if you need to cancel, schedule, or reschedule an appointment.   Please call the Suicide and Crisis Lifeline: 988 call the [place]  National Suicide Prevention Lifeline: 980-593-2939 or TTY: 214-203-1573 TTY 843-342-9505) to talk to a trained counselor call 1-800-273-TALK (toll free, 24 hour hotline) go to [facility] [address] [place] [phone]) call 911 if you are experiencing a Mental Health or Behavioral Health Crisis or need someone to talk to.  Lyle Rung, BSW, MSW, LCSW Licensed Clinical Social Worker [employer]   [facility] [email or web address] Direct Dial : [phone]

## 2024-12-20 ENCOUNTER — Encounter: Payer: Self-pay | Admitting: Gastroenterology

## 2024-12-22 ENCOUNTER — Telehealth: Payer: Self-pay | Admitting: Pharmacist

## 2024-12-22 ENCOUNTER — Other Ambulatory Visit: Payer: Self-pay | Admitting: Pharmacist

## 2024-12-22 DIAGNOSIS — K7682 Hepatic encephalopathy: Secondary | ICD-10-CM | POA: Insufficient documentation

## 2024-12-22 DIAGNOSIS — R252 Cramp and spasm: Secondary | ICD-10-CM | POA: Insufficient documentation

## 2024-12-22 NOTE — Progress Notes (Deleted)
 "  12/22/2024 Name: Sean [name] MRN: [id number] DOB: 06/04/1971  No chief complaint on file.   {Visit Ubez:73349}   Subjective:  Care Team: Primary Care Provider: Jesus Bernardino MATSU, MD ; Next Scheduled Visit: *** {careteamprovider:27366}  Medication Access/Adherence  Current Pharmacy[facility]  [facility] [address] [place] - [address] [place] [address] Phone: [phone] Fax: [phone]  [facility] [address] [place] [address] Phone: [phone] Fax: [phone]  [facility] [address] [place] [address] Phone: [phone] Fax: [phone]  [facility] #[id number] [facility], [place] - [address] [place] [address] Phone: [phone] Fax: [phone]  [facility] [place] [place] - [id number] [phone] [address] [place] [address] Phone: [phone] Fax: [phone]   Patient reports affordability concerns with their medications: {YES/NO:21197} Patient reports access/transportation concerns to their pharmacy: {YES/NO:21197} Patient reports adherence concerns with their medications:  {YES/NO:21197} ***   {Pharmacy S/O Choices:26420}   Objective:  Lab Results  Component Value Date   HGBA1C 4.8 09/08/2024    Lab Results  Component Value Date   CREATININE 7.27 (HH) 09/08/2024   BUN 41 (H) 09/08/2024   NA 138 09/08/2024   K 3.2 (L) 09/08/2024   CL 95 (L) 09/08/2024   CO2 30 09/08/2024    Lab Results  Component Value Date   CHOL 195 09/08/2024   HDL 40.40 09/08/2024   LDLCALC 107 (H) 09/08/2024   LDLDIRECT 45 10/21/2019   TRIG 238.0 (H) 09/08/2024   CHOLHDL 5 09/08/2024    Medications Reviewed Today     Reviewed by Carla Milling, RPH-CPP (Pharmacist) on 12/22/24 at 1624  Med List Status: <None>   Medication Order Taking? Sig Documenting Provider Last Dose Status  Informant  albuterol  (VENTOLIN  HFA) 108 (90 Base) MCG/ACT inhaler [id number] Yes Inhale 2 puffs into the lungs every 4 (four) hours as needed. [provider]  Active   allopurinol  (ZYLOPRIM ) 100 MG tablet [id number] Yes TAKE 1 TABLET BY MOUTH EVERY DAY Jesus Bernardino MATSU, MD  Active Self, Mother, Pharmacy Records  amitriptyline  (ELAVIL ) 25 MG tablet [id number] Yes TAKE 1 TABLET BY MOUTH EVERYDAY AT BEDTIME May, Deanna J, NP  Active   azelastine  (ASTELIN ) 0.1 % nasal spray [id number] Yes Place 2 sprays into both nostrils 2 (two) times daily. Use in each nostril as directed Tobie Eldora NOVAK, MD  Active Self, Mother, Pharmacy Records  B Complex-C-Folic Acid  (DIALYVITE  TABLET) TABS [id number] Yes Take 1 tablet by mouth daily. [provider]  Active Self, Mother, Pharmacy Records  Cyanocobalamin  (VITAMIN B-12) 5000 MCG SUBL [id number]  Place 5,000 mcg under the tongue daily. [provider]  Active Self, Mother, Pharmacy Records  diltiazem  (CARDIZEM  SR) 120 MG 12 hr capsule [id number] Yes Take 1 capsule (120 mg total) by mouth 2 (two) times daily. Job Lukes, [place]  Active   ELIQUIS  5 MG TABS tablet [id number] Yes TAKE 1 TABLET BY MOUTH TWICE A DAY Chandrasekhar, Mahesh A, MD  Active   Evolocumab  (REPATHA ) 140 MG/ML SOSY [id number] Yes Inject 140 mg into the skin every 14 (fourteen) days. [provider]  Active Self, Mother, Pharmacy Records           Med Note EVERETTE JESUSA JAYSON   Dju Aug 06, 2024 11:34 AM) Next dose is due 9.14.2025    Discontinued 12/22/24 1615 (  Duplicate) hydrocortisone  (ANUSOL -HC) 25 MG suppository [id number]  Place 1 suppository (25 mg total) rectally 2 (two) times daily. Jesus Bernardino MATSU, MD  Active   hydrOXYzine  (ATARAX ) 25 MG tablet [id number] Yes Take 1 tablet by mouth every 6 (six) hours as needed. [provider]  Active   lactulose  (CHRONULAC ) 10 GM/15ML solution [id number] Yes TAKE 45 MLS 4 TIMES DAILY AS NEEDED (TO ACHEIVE GOAL OF 2-3 BMS  DAILY). TITRATE BASED ON THE STOOL FREQUENCY. Jesus Bernardino MATSU, MD  Active   lactulose , encephalopathy, (CHRONULAC ) 10 GM/15ML SOLN [id number]  Take 30 mLs by mouth in the morning and at bedtime. [provider]  Active   lanthanum  (FOSRENOL ) 1000 MG chewable tablet [id number] Yes Chew 1,000 mg by mouth 3 (three) times daily with meals. [provider]  Active Self, Mother, Pharmacy Records           Med Note EVERETTE JESUSA BROCKS   Dju Aug 06, 2024 11:35 AM) Patient takes 2 capsules 3 times daily  losartan  (COZAAR ) 50 MG tablet [id number]  Take 1 tablet (50 mg total) by mouth daily.  Patient not taking: Reported on 12/22/2024   Job Lukes, [place]  Active   Methoxy PEG-Epoetin Beta [facility] IJ) [id number] Yes 75 mcg. [provider]  Active Self, Mother, Pharmacy Records           Med Note EVERETTE JESUSA BROCKS   Dju Aug 06, 2024 11:35 AM) At dialysis  montelukast  (SINGULAIR ) 10 MG tablet [id number] Yes TAKE 1 TABLET BY MOUTH EVERY DAY Jesus Bernardino MATSU, MD  Active   mupirocin  ointment (BACTROBAN ) 2 % [id number]  Apply to inner nares twice daily [place], [place], [place]  Active   neomycin -polymyxin-hydrocortisone  (CORTISPORIN) 3.5-10000-1 OTIC suspension [id number]  Apply 1-2 drops daily after soaking and cover with bandaid Silva Juliene SAUNDERS, DPM  Active   nitroGLYCERIN  (NITROSTAT ) 0.4 MG SL tablet [id number]  Place 1 tablet (0.4 mg total) under the tongue every 5 (five) minutes as needed for chest pain. Santo Stanly LABOR, MD  Active   ondansetron  (ZOFRAN -ODT) 4 MG disintegrating tablet [id number] Yes Take 1 tablet (4 mg total) by mouth every 8 (eight) hours as needed for nausea or vomiting. Jesus Bernardino MATSU, MD  Active Self, Mother, Pharmacy Records  oxyCODONE -acetaminophen  (PERCOCET) 5-325 MG tablet [id number]  Take 1 tablet by mouth every 6 (six) hours as needed for severe pain (pain score 7-10).  Patient not taking: Reported on 12/22/2024   Rhyne, Samantha J, PA-C  Consider Medication  Status and Discontinue (Completed Course) Self, Mother, Pharmacy Records   Patient not taking:   Discontinued 12/22/24 1617 (Change in therapy)   polyethylene glycol (MIRALAX  / GLYCOLAX ) 17 g packet [id number] Yes Take 17 g by mouth daily as needed. Titrate based on the stool frequency. Goal :2-3 BMs a day, Job, [place], [place]  Active   promethazine  (PHENERGAN ) 25 MG tablet [id number] Yes Take 1 tablet (25 mg total) by mouth every 8 (eight) hours as needed for nausea or vomiting. Jesus Bernardino MATSU, MD  Active Self, Mother, Pharmacy Records  RABEprazole  (ACIPHEX ) 20 MG tablet [id number]  Take 1 tablet (20 mg total) by mouth daily.  Patient not taking: Reported on 12/22/2024   Jesus Bernardino MATSU, MD  Active   rifaximin  (XIFAXAN ) 550 MG TABS tablet [id number] Yes Take 550 mg by mouth 2 (two) times daily. [provider]  Active   sucralfate  (CARAFATE ) 1 g tablet [id number] Yes Take 1 g by mouth at bedtime. [provider]  Active   technetium tetrofosmin  (TC-MYOVIEW ) injection 29.8 millicurie [id number]   Mona Vinie BROCKS, MD  Active     Discontinued 12/22/24 1611 (Duplicate)   tirzepatide  (ZEPBOUND ) 2.5 MG/0.5ML Pen [id number]  Inject 2.5 mg into the skin once a week.  Patient not taking: Reported on 12/22/2024   Jesus Bernardino MATSU, MD  Active   torsemide  (DEMADEX ) 10 MG tablet [id number] Yes Take 10 mg by mouth daily.  Patient taking differently: Take 10 mg by mouth daily. Taking prn   [provider]  Active Self, Mother, Pharmacy Records           Med Note EVERETTE JESUSA BROCKS   Dju Aug 06, 2024 11:38 AM) Takes as needed  triamcinolone  cream (KENALOG ) 0.1 % [id number]  Apply 1 Application topically 2 (two) times daily. Jesus Bernardino MATSU, MD  Active   Vonoprazan Fumarate  (VOQUEZNA ) 20 MG TABS [id number]  Take by mouth. [provider]  Active Self, Mother, Pharmacy Records           Med Note EVERETTE JESUSA BROCKS   Dju Aug 06, 2024 11:38 AM) This is a sample medication the patient  received from Dr Jesus  Vonoprazan Fumarate  (VOQUEZNA ) 20 MG TABS [id number] Yes Take 1 tablet by mouth daily at 6 (six) AM. Jesus Bernardino MATSU, MD  Active   Med List Note Christie Alyson Sola 05/04/24 9063): Dialysis Tue., Thurs.,& Sat.              Assessment/Plan:   {Pharmacy A/P Choices:26421}  Follow Up Plan: ***  ***   "

## 2024-12-22 NOTE — Progress Notes (Signed)
 "  12/22/2024 Name: Sean [name] MRN: [id number] DOB: Sep 22, 1971  Chief Complaint  Patient presents with   Medication Adherence   Medication Management    Cost    Sean [name] is a 54 y.o. year old male who presented for a telephone visit.   They were referred to the pharmacist by their PCP for assistance in managing medication access and complex medication management.    Subjective:  Sean [name] reports that his insurance premium has increased from $270 / month in 2025 to over $1000 / month for 2026. He currently has coverage but if he cannot get assistance with premium cost he fears he will no longer be able to afford his current coverage. He has a Product Manager. He is working with child psychotherapist and has applied for assistance with the [employer].   I reviewed his current BCBS plan. He has a $2800 deductible. Prescription coverage is Tier 1 medicaitons are $10 / 30 days and tiers 2 thru 5 are 50% of medication cost after deductible is met.   Current Medication conditions: end stage renal disease on [name], Metabolic dysfunction associated with steatohepatitis, hepatic encephalopathy, advanced hepatic fibrosis, hypertension, CAD, atrial fibrillation, CHF, obstructive sleep apnea, asthma, GERD, PUD, secondary hyperparathyroidism, gout, MGUS, prediabetes  Care Team: Primary Care Provider: Jesus Bernardino MATSU, MD ; Next Scheduled Visit: 01/17/2025 Nephrologist - Sean [name] at [facility] - [phone] Hepatic Clinic - Sean [name] with [facility] - Sean [name] - Next Scheduled Visit: 01/26/2025 Dermatology; Next Scheduled Visit: 09/05/2025   Medication Access/Adherence  Current Pharmacy:  [facility] [address] [place], [place] - [address] [place] [address] Phone: [phone] Fax: [phone]  [facility] [address] [place] [address] Phone:  [phone] Fax: [phone]  [name] [address] [place] [address] Phone: [phone] Fax: [phone]  [facility] #[id number] [facility], [place] - [address] [place] [address] Phone: [phone] Fax: [phone]  [facility] [place] - [address] [phone] [address] [place] [address] Phone: [phone] Fax: [phone]   Patient reports affordability concerns with their medications: Yes  Patient reports access/transportation concerns to their pharmacy: No  Patient reports adherence concerns with their medications:  Yes   - stopped losartan  / MD aware due to low blood pressure / dizziness.   Patient reports he has run out of samples of Voquezna . Sean Sean has provided information about getting Voquezna  thry [facility]. Cost would be $50 / month.   Patient also mentions that Fosrenol  1000mg  prescription cost is high and he is taking it differently than on his medication list. Patient has bee taking Fosrenol  1000mg  - 2 tablets 3 times a day with meals but our directions have 1 tablet 3 times a day.   Other high cost medications - Eliquis , Xifaxan  (was prescribed today) and Repatha  (has enough to last until February 2026).   He has been prescribed Mounjaro / tirezepatide but it needed a prior authorization. Prior authorization and appeal was denied in November 2025.  There have been phase 2 studies that have shown tirzepatide  achieved MASH resolution without worsening fibrosis when compared to a placbeo arm.    Objective:  Lab Results  Component Value Date   HGBA1C 4.8 09/08/2024    Lab Results  Component Value Date  CREATININE 7.27 (HH) 09/08/2024   BUN 41 (H) 09/08/2024   NA 138 09/08/2024   K 3.2 (L) 09/08/2024   CL 95 (L) 09/08/2024   CO2 30 09/08/2024    Lab Results  Component Value Date   CHOL 195 09/08/2024   HDL 40.40 09/08/2024   LDLCALC 107 (H)  09/08/2024   LDLDIRECT 45 10/21/2019   TRIG 238.0 (H) 09/08/2024   CHOLHDL 5 09/08/2024    Medications Reviewed Today     Reviewed by Sean [name] (Pharmacist) on 12/22/24 at 1634  Med List Status: <None>   Medication Order Taking? Sig Documenting Provider Last Dose Status Informant  albuterol  (VENTOLIN  HFA) 108 (90 Base) MCG/ACT inhaler [id number]  Inhale 2 puffs into the lungs every 4 (four) hours as needed. [provider]  Active   allopurinol  (ZYLOPRIM ) 100 MG tablet [id number]  TAKE 1 TABLET BY MOUTH EVERY DAY Sean Bernardino MATSU, MD  Active Self, Mother, Pharmacy Records  amitriptyline  (ELAVIL ) 25 MG tablet [id number]  TAKE 1 TABLET BY MOUTH EVERYDAY AT BEDTIME May, Deanna J, NP  Active   azelastine  (ASTELIN ) 0.1 % nasal spray [id number]  Place 2 sprays into both nostrils 2 (two) times daily. Use in each nostril as directed Sean Eldora NOVAK, MD  Active Self, Mother, Pharmacy Records  B Complex-C-Folic Acid  (DIALYVITE  TABLET) TABS [id number]  Take 1 tablet by mouth daily. [provider]  Active Self, Mother, Pharmacy Records  Cyanocobalamin  (VITAMIN B-12) 5000 MCG SUBL [id number]  Place 5,000 mcg under the tongue daily. [provider]  Active Self, Mother, Pharmacy Records  diltiazem  (CARDIZEM  SR) 120 MG 12 hr capsule [id number]  Take 1 capsule (120 mg total) by mouth 2 (two) times daily. Job [place], [place]  Active   ELIQUIS  5 MG TABS tablet [id number]  TAKE 1 TABLET BY MOUTH TWICE A DAY Chandrasekhar, Mahesh A, MD  Active   Evolocumab  (REPATHA ) 140 MG/ML SOSY [id number]  Inject 140 mg into the skin every 14 (fourteen) days. [provider]  Active Self, Mother, Pharmacy Records           Med Note EVERETTE JESUSA BROCKS   Dju Aug 06, 2024 11:34 AM) Next dose is due 9.14.2025  hydrocortisone  (ANUSOL -HC) 25 MG suppository [id number]  Place 1 suppository (25 mg total) rectally 2 (two) times daily. Sean Bernardino MATSU, MD  Active   hydrOXYzine  (ATARAX ) 25 MG tablet  [id number]  Take 1 tablet by mouth every 6 (six) hours as needed. [provider]  Active   lactulose  (CHRONULAC ) 10 GM/15ML solution [id number]  TAKE 45 MLS 4 TIMES DAILY AS NEEDED (TO ACHEIVE GOAL OF 2-3 BMS DAILY). TITRATE BASED ON THE STOOL FREQUENCY. Sean Bernardino MATSU, MD  Active   lactulose , encephalopathy, (CHRONULAC ) 10 GM/15ML SOLN [id number]  Take 30 mLs by mouth in the morning and at bedtime. [provider]  Active   lanthanum  (FOSRENOL ) 1000 MG chewable tablet [id number]  Chew 1,000 mg by mouth 3 (three) times daily with meals. [provider]  Active Self, Mother, Pharmacy Records           Med Note EVERETTE JESUSA BROCKS   Dju Aug 06, 2024 11:35 AM) Patient takes 2 capsules 3 times daily  losartan  (COZAAR ) 50 MG tablet [id number]  Take 1 tablet (50 mg total) by mouth daily.  Patient not taking: Reported on 12/22/2024   Job Lukes, [place]  Active   Methoxy PEG-Epoetin Beta (MIRCERA IJ) [id number]  75 mcg. [provider]  Active Self,  Mother, Pharmacy Records           Med Note EVERETTE JESUSA BROCKS   Dju Aug 06, 2024 11:35 AM) At [name]  montelukast  (SINGULAIR ) 10 MG tablet [id number]  TAKE 1 TABLET BY MOUTH EVERY DAY Sean Bernardino MATSU, MD  Active   mupirocin  ointment (BACTROBAN ) 2 % [id number]  Apply to inner nares twice daily [place], [place], [place]  Active   neomycin -polymyxin-hydrocortisone  (CORTISPORIN) 3.5-10000-1 OTIC suspension [id number]  Apply 1-2 drops daily after soaking and cover with bandaid Silva Juliene SAUNDERS, DPM  Active   nitroGLYCERIN  (NITROSTAT ) 0.4 MG SL tablet [id number]  Place 1 tablet (0.4 mg total) under the tongue every 5 (five) minutes as needed for chest pain. Santo Stanly LABOR, MD  Active   ondansetron  (ZOFRAN -ODT) 4 MG disintegrating tablet [id number]  Take 1 tablet (4 mg total) by mouth every 8 (eight) hours as needed for nausea or vomiting. Sean Bernardino MATSU, MD  Active Self, Mother, Pharmacy Records  oxyCODONE -acetaminophen  (PERCOCET)  5-325 MG tablet [id number]  Take 1 tablet by mouth every 6 (six) hours as needed for severe pain (pain score 7-10).  Patient not taking: Reported on 12/22/2024   Rhyne, Samantha J, PA-C  Active Self, Mother, Pharmacy Records  polyethylene glycol (MIRALAX  / GLYCOLAX ) 17 g packet [id number]  Take 17 g by mouth daily as needed. Titrate based on the stool frequency. Goal :2-3 BMs a day, Job, [place], [place]  Active   promethazine  (PHENERGAN ) 25 MG tablet [id number]  Take 1 tablet (25 mg total) by mouth every 8 (eight) hours as needed for nausea or vomiting. Sean Bernardino MATSU, MD  Active Self, Mother, Pharmacy Records  RABEprazole  (ACIPHEX ) 20 MG tablet [id number]  Take 1 tablet (20 mg total) by mouth daily.  Patient not taking: Reported on 12/22/2024   Sean Bernardino MATSU, MD  Active   rifaximin  (XIFAXAN ) 550 MG TABS tablet [id number]  Take 550 mg by mouth 2 (two) times daily. [provider]  Active   sucralfate  (CARAFATE ) 1 g tablet [id number]  Take 1 g by mouth at bedtime. [provider]  Active   technetium tetrofosmin  (TC-MYOVIEW ) injection 29.8 millicurie [id number]   Mona Vinie BROCKS, MD  Active   tirzepatide  (ZEPBOUND ) 2.5 MG/0.5ML Pen [id number]  Inject 2.5 mg into the skin once a week.  Patient not taking: Reported on 12/22/2024   Sean Bernardino MATSU, MD  Active   torsemide  (DEMADEX ) 10 MG tablet [id number]  Take 10 mg by mouth daily.  Patient taking differently: Take 10 mg by mouth daily. Taking prn   [provider]  Active Self, Mother, Pharmacy Records           Med Note EVERETTE JESUSA BROCKS   Dju Aug 06, 2024 11:38 AM) Takes as needed  triamcinolone  cream (KENALOG ) 0.1 % [id number]  Apply 1 Application topically 2 (two) times daily. Sean Bernardino MATSU, MD  Active   Vonoprazan Fumarate  (VOQUEZNA ) 20 MG TABS [id number]  Take by mouth. [provider]  Active Self, Mother, Pharmacy Records           Med Note EVERETTE JESUSA BROCKS   Dju Aug 06, 2024 11:38 AM) This is a sample  medication the patient received from Sean Sean  Vonoprazan Fumarate  (VOQUEZNA ) 20 MG TABS [id number]  Take 1 tablet by mouth daily at 6 (six) AM. Sean Bernardino MATSU, MD  Active   Med List Note Christie Alyson Sola 05/04/24 9063): [name] Tue., Thurs.,& Sat.  Repatha  Copay Card / This is your personal co-pay card information: RxBin: [id number] RxPCN: [id number] RxGrp: [id number] ID: [id number]       Assessment/Plan:   Medication Management / Access:  - If patient is able to keep his current BCBS Commercial plan we can offer copay cards that would lower his cost for the following medications: Eliquis , Repatha , Xifaxan .  Copay card for Xifaxan  was used - cost $0. Patient was not due to refill Eliquis  or Repatha  yet. Will check back in February 2026.  - He will continue to work with child psychotherapist / case worker on assistance for bj's and deductible thru the [employer]  - If he is not able to continue with his charles schwab plan, will assist patient with applying to patient assistance program programs for above mentioned medications.  - Tried to call [name] to verify his Fosrenol  dose and request an updated Rx but I was only able to speak to the social worker, Nursing staff has left for the day. I will try to call back later.  - Since prior authorization for Mounjaro  was denied, recommend consider Wegovy  0.25 weekly to start and titrate as tolerated up to at 2.4mg  weekly. 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 In clinical trials, semaglutide  showed significant improvement in resolving steatohepatitis without worsening fibrosis and to reduce liver fibrosis without worsening steatohepatitis.   Madelin Ray, PharmD Clinical Pharmacist [facility] [phone]  12/27/2024 Addendum Verified with [name] - patient to take either Fosrenol  750mg  - 3 tablets with meals or calcium  acetate 667mg  - 3 capsules with meals and 1 with snacks. Patient aware.   Madelin Ray, PharmD Clinical Pharmacist [facility]  Population Health [phone]     "

## 2024-12-22 NOTE — Telephone Encounter (Signed)
 Second outreach to patient at 3:45pm (45 minutes after appointment) was successful. See phone visit note.

## 2024-12-23 ENCOUNTER — Other Ambulatory Visit: Payer: Self-pay | Admitting: Nurse Practitioner

## 2024-12-23 DIAGNOSIS — K7682 Hepatic encephalopathy: Secondary | ICD-10-CM

## 2024-12-23 DIAGNOSIS — K7581 Nonalcoholic steatohepatitis (NASH): Secondary | ICD-10-CM

## 2024-12-23 DIAGNOSIS — K7402 Hepatic fibrosis, advanced fibrosis: Secondary | ICD-10-CM

## 2024-12-28 ENCOUNTER — Other Ambulatory Visit (HOSPITAL_BASED_OUTPATIENT_CLINIC_OR_DEPARTMENT_OTHER): Payer: Self-pay

## 2024-12-29 ENCOUNTER — Other Ambulatory Visit: Payer: Self-pay

## 2024-12-29 NOTE — Patient Instructions (Signed)

## 2024-12-29 NOTE — Patient Outreach (Signed)
 SW t/c patient to address concerns regarding connecting with [facility].  [facility] reports the [facility] SW made the connection and assisted with the application.  [facility] approved to pay $1100 insurance premium.  Patient is working with [employer] team on cost of medications.  Patient states he has no other unmet need.  Closed.  Tillman Gardener, BSW [place]  [facility], [facility] Social Worker Direct Dial : [phone]  Fax: [phone] Website: [email or web address]

## 2025-01-02 ENCOUNTER — Other Ambulatory Visit: Payer: Self-pay | Admitting: Internal Medicine

## 2025-01-02 DIAGNOSIS — R1013 Epigastric pain: Secondary | ICD-10-CM

## 2025-01-02 DIAGNOSIS — R110 Nausea: Secondary | ICD-10-CM

## 2025-01-03 ENCOUNTER — Other Ambulatory Visit (HOSPITAL_COMMUNITY): Payer: Self-pay

## 2025-01-06 ENCOUNTER — Telehealth: Admitting: Licensed Clinical Social Worker

## 2025-01-09 ENCOUNTER — Telehealth: Admitting: Licensed Clinical Social Worker

## 2025-01-12 ENCOUNTER — Other Ambulatory Visit: Admitting: Pharmacist

## 2025-01-17 ENCOUNTER — Ambulatory Visit: Admitting: Internal Medicine

## 2025-01-19 ENCOUNTER — Other Ambulatory Visit

## 2025-01-26 ENCOUNTER — Ambulatory Visit: Admitting: Oncology

## 2025-09-05 ENCOUNTER — Ambulatory Visit: Admitting: Physician Assistant
